# Patient Record
Sex: Female | Born: 1975 | Race: White | Marital: Married | Smoking: Former smoker
Health system: Northeastern US, Academic
[De-identification: ages and names within clinical notes are randomized; demographics above are authoritative.]

## PROBLEM LIST (undated history)

## (undated) DIAGNOSIS — F32A Depression, unspecified: Secondary | ICD-10-CM

## (undated) DIAGNOSIS — K769 Liver disease, unspecified: Secondary | ICD-10-CM

## (undated) DIAGNOSIS — I10 Essential (primary) hypertension: Secondary | ICD-10-CM

## (undated) DIAGNOSIS — N6089 Other benign mammary dysplasias of unspecified breast: Secondary | ICD-10-CM

## (undated) DIAGNOSIS — J45909 Unspecified asthma, uncomplicated: Secondary | ICD-10-CM

## (undated) DIAGNOSIS — E785 Hyperlipidemia, unspecified: Secondary | ICD-10-CM

## (undated) DIAGNOSIS — G43909 Migraine, unspecified, not intractable, without status migrainosus: Secondary | ICD-10-CM

## (undated) DIAGNOSIS — M1990 Unspecified osteoarthritis, unspecified site: Secondary | ICD-10-CM

## (undated) DIAGNOSIS — G709 Myoneural disorder, unspecified: Secondary | ICD-10-CM

## (undated) DIAGNOSIS — E119 Type 2 diabetes mellitus without complications: Secondary | ICD-10-CM

## (undated) DIAGNOSIS — B019 Varicella without complication: Secondary | ICD-10-CM

## (undated) DIAGNOSIS — I209 Angina pectoris, unspecified: Secondary | ICD-10-CM

## (undated) DIAGNOSIS — N938 Other specified abnormal uterine and vaginal bleeding: Secondary | ICD-10-CM

## (undated) DIAGNOSIS — G90A Postural orthostatic tachycardia syndrome (POTS): Secondary | ICD-10-CM

## (undated) DIAGNOSIS — M797 Fibromyalgia: Secondary | ICD-10-CM

## (undated) DIAGNOSIS — F419 Anxiety disorder, unspecified: Secondary | ICD-10-CM

## (undated) HISTORY — DX: Unspecified osteoarthritis, unspecified site: M19.90

## (undated) HISTORY — DX: Varicella without complication: B01.9

## (undated) HISTORY — DX: Other specified abnormal uterine and vaginal bleeding: N93.8

## (undated) HISTORY — PX: OTHER SURGICAL HISTORY: SHX169

## (undated) HISTORY — DX: Hyperlipidemia, unspecified: E78.5

## (undated) HISTORY — DX: Liver disease, unspecified: K76.9

## (undated) HISTORY — PX: DILATION AND CURETTAGE OF UTERUS: SHX78

## (undated) HISTORY — DX: Anxiety disorder, unspecified: F41.9

## (undated) HISTORY — DX: Fibromyalgia: M79.7

## (undated) HISTORY — DX: Type 2 diabetes mellitus without complications: E11.9

## (undated) HISTORY — DX: Myoneural disorder, unspecified: G70.9

## (undated) HISTORY — DX: Unspecified asthma, uncomplicated: J45.909

## (undated) HISTORY — DX: Migraine, unspecified, not intractable, without status migrainosus: G43.909

## (undated) HISTORY — PX: COLON SURGERY: SHX602

## (undated) HISTORY — DX: Other benign mammary dysplasias of unspecified breast: N60.89

## (undated) HISTORY — PX: TONSILLECTOMY: SHX28A

## (undated) HISTORY — DX: Depression, unspecified: F32.A

## (undated) HISTORY — DX: Essential (primary) hypertension: I10

---

## 2008-05-06 HISTORY — PX: OTHER SURGICAL HISTORY: SHX169

## 2009-03-09 ENCOUNTER — Ambulatory Visit: Payer: Self-pay | Admitting: Neurology

## 2009-03-09 DIAGNOSIS — J45909 Unspecified asthma, uncomplicated: Secondary | ICD-10-CM | POA: Insufficient documentation

## 2009-03-09 DIAGNOSIS — G43109 Migraine with aura, not intractable, without status migrainosus: Secondary | ICD-10-CM | POA: Insufficient documentation

## 2009-03-09 DIAGNOSIS — IMO0002 Reserved for concepts with insufficient information to code with codable children: Secondary | ICD-10-CM | POA: Insufficient documentation

## 2009-03-09 DIAGNOSIS — I10 Essential (primary) hypertension: Secondary | ICD-10-CM | POA: Insufficient documentation

## 2009-03-09 DIAGNOSIS — E785 Hyperlipidemia, unspecified: Secondary | ICD-10-CM | POA: Insufficient documentation

## 2009-06-22 ENCOUNTER — Ambulatory Visit: Payer: Self-pay | Admitting: Neurology

## 2009-07-06 ENCOUNTER — Ambulatory Visit: Payer: Self-pay | Admitting: Neurology

## 2009-08-28 ENCOUNTER — Encounter: Payer: Self-pay | Admitting: Gastroenterology

## 2009-09-28 ENCOUNTER — Ambulatory Visit: Payer: Self-pay | Admitting: Neurology

## 2009-10-23 NOTE — Progress Notes (Addendum)
HPI   Kaylee Harris is a 34 yo F here for follow-up of migraines. These are R   temporal/retro-orbital headaches with nausea, vomiting, photophobia and   phonophobia with preceding sparkling lights. As her prophylactic meds have   been adjusted, the headache frequency has varied. While on Topamax 75mg  QHS   and Lexapro 20mg  daily she had headaches only every 3 months. She then   switched antidepressants for insurance reasons to paroxetine and the   headaches increased to at least monthly. This past month she had 3-4   headaches.  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  As for abortive agents, the Compazine helps the nausea, but not the   headache. A low dose of Imitrex was not effective. She takes Mobic for her   shoulder pain and occasional Advil (1-2 days/week) for headaches.  Her overall health has been ok other than shoulder pain and a sinus   infection recently.  Active Problems   Asthma (493.90)  Diabetes Mellitus (250.00)  Hyperlipidemia (272.4)  Hypertension (401.9)  Migraine Headache (346.90).  Current Meds   Glimepiride 1 MG Tablet;; RPT  MetFORMIN HCl 500 MG Tablet Extended Release 24 Hour;; RPT  Vitamin D (Ergocalciferol) 50000 UNIT Capsule;; RPT  Endocet 7.5-325 MG Tablet;; RPT  Ventolin HFA 108 (90 Base) MCG/ACT Aerosol Solution;; RPT  Prochlorperazine Maleate 10 MG Tablet;TAKE 1 TABLET 3 TIMES DAILY PRN   headache and nausea; Rx  Lexapro 20 MG Tablet;TAKE 1 TABLET DAILY.; Rx  PARoxetine HCl 10 MG Tablet;TAKE 1 TABLET DAILY.; Rx  Topiramate 25 MG Tablet;Take 50mg  PO QHS for 2 weeks. Then take 75mg  QHS   and continue.; Rx  FLUoxetine HCl 20 MG Capsule;TAKE 1 CAPSULE DAILY.; Rx  SUMAtriptan Succinate 50 MG Tablet;TAKE 1 TABLET FOR MIGRAINE RELIEF. MAY   REPEAT after 2 hours, max. dose 100mg /day; Rx  Topiramate 25 MG Tablet;TAKE 3 TABLET BEDTIME; Rx  Ventolin 90 MCG/ACT AERS;INHALE 2 PUFFS 4 TIMES DAILY AS NEEDED.; RPT  Famciclovir 500 MG  Tablet;TAKE 1 TABLET 3 TIMES DAILY.; RPT  Cyclobenzaprine HCl 10 MG Tablet;TAKE 1 TABLET AT BEDTIME.; RPT  Meloxicam 15 MG Tablet;TAKE 1 TABLET DAILY.; RPT  Fluticasone Propionate 50 MCG/ACT Suspension;USE 1 SPRAY IN EACH NOSTRIL   ONCE DAILY.; RPT  Sertraline HCl 100 MG Tablet;TAKE 1 TABLET ONCE DAILY.; RPT  Vitamin D 56213 U TABS;! twice a week.; RPT  Hydrochlorothiazide 25 MG Tablet;TAKE 1/2 TABLET DAILY.; RPT  Glimepiride 2 MG Tablet;TAKE 1 TABLET DAILY WITH BREAKFAST.; RPT  MetFORMIN HCl 500 MG Tablet;TAKE 1 TABLET DAILY.; RPT  Omeprazole 20 MG Capsule Delayed Release;TAKE 1 CAPSULE TWICE DAILY.; RPT  Cetirizine HCl 10 MG Tablet;TAKE 1 TABLET DAILY AS DIRECTED.; RPT  Ester C TABS;; RPT.  ROS   10 point ROS negative other than listed in HPI.  Vital Signs   BP 164/71,  P 77, Wt 273 lbs.  Physical Exam   GENERAL:         Well appearing, in NAD.     MENTAL STATUS:  Alert and oriented x 3 with no deficits of speech or   language.  Affect  appropriate to situation.     CRANIAL NERVES:  Visual fields were full.  Fundoscopic exam was normal.    Pupils were 4 mm to 2 mm bilaterally.  EOMs were full without nystagmus.   Facial sensation and musculature were symmetric.  Palatal elevation was  symmetric, and tongue was midline.     MOTOR:  Bulk and tone were normal throughout.  Strength was 5/5 in   shoulders, biceps, triceps, FF's, hip flexors, ankle dorsi-and plantar   flexors.  No pronator drift.     SENSATION:  Intact  temperature and vibration in 4 extremities.     COORDINATION:  Finger to nose without ataxia.       GAIT:  Normal casual and tandem gait.     REFLEXES:  2+ upper and lower with downgoing toes.  Impression   34 yo F with migraines that are more frequent when her depression is more   active. Frequency of headaches has been reduced, but will try to further   reduce the number that begin.     -Continue Topamax 75mg  QHS  -Will try Prozac as SSRI for mood and migraine prophylaxis  -Imitrex (higher dose than  she previously tried) for abortive agent.  Attestation       I saw and evaluated the patient.  I agree with the resident's/fellow's   findings and plan of care as documented above.      Cleopatra Cedar, MD 10/23/2009 [facility]ill CenterCleopatra Cedar ; 10/23/2009 12:40   PM EST.  Signature   Electronically signed by: Gaetana Michaelis  MD - Res.; 10/18/2009 8:49 PM EST.  Electronically signed by: Cleopatra Cedar  M.D.; 10/23/2009 12:34 PM EST.  Electronically signed by: Cleopatra Cedar  M.D.; 10/23/2009 12:40 PM EST.

## 2010-05-15 NOTE — Miscellaneous (Unsigned)
 Continuity of Care Record  Created: todo  From: DAVID, MARTHA  From:   From: [employer], EHR v10.2.7.53  To: Sieg, Reyanne  Purpose: Patient Use;       Problems  Diagnosis: Asthma (493.90)   Diagnosis: Diabetes Mellitus (250.00)   Diagnosis: Hyperlipidemia (272.4)   Diagnosis: Hypertension (401.9)   Diagnosis: Migraine Headache (346.90)     Medications  Cetirizine HCl 10 MG Tablet; TAKE 1 TABLET DAILY AS DIRECTED. ; RPT   Cyclobenzaprine HCl 10 MG Tablet; TAKE 1 TABLET AT BEDTIME. ; RPT   Endocet 7.5-325 MG Tablet ; RPT   Ester C TABS ; RPT   Famciclovir 500 MG Tablet; TAKE 1 TABLET 3 TIMES DAILY. ; RPT   FLUoxetine HCl 20 MG Capsule; TAKE 1 CAPSULE DAILY. ; Rx   Fluticasone Propionate 50 MCG/ACT Suspension; USE 1 SPRAY IN EACH NOSTRIL   ONCE DAILY. ; RPT   Glimepiride 1 MG Tablet ; RPT   Glimepiride 2 MG Tablet; TAKE 1 TABLET DAILY WITH BREAKFAST. ; RPT   Hydrochlorothiazide 25 MG Tablet; TAKE 1/2 TABLET DAILY. ; RPT   Lexapro 20 MG Tablet; TAKE 1 TABLET DAILY. ; Rx   Meloxicam 15 MG Tablet; TAKE 1 TABLET DAILY. ; RPT   MetFORMIN HCl 500 MG Tablet; TAKE 1 TABLET DAILY. ; RPT   MetFORMIN HCl 500 MG Tablet Extended Release 24 Hour ; RPT   Omeprazole 20 MG Capsule Delayed Release; TAKE 1 CAPSULE TWICE DAILY. ; RPT   PARoxetine HCl 10 MG Tablet; TAKE 1 TABLET DAILY. ; Rx   Prochlorperazine Maleate 10 MG Tablet; TAKE 1 TABLET 3 TIMES DAILY PRN   headache and nausea ; Rx   Sertraline HCl 100 MG Tablet; TAKE 1 TABLET ONCE DAILY. ; RPT   SUMAtriptan Succinate 50 MG Tablet; TAKE 1 TABLET FOR MIGRAINE RELIEF. MAY   REPEAT after 2 hours, max. dose 100mg /day ; Rx   Topiramate 25 MG Tablet; TAKE 3 TABLET BEDTIME ; Rx   Topiramate 25 MG Tablet; Take 75mg  QHS PO. ; Rx   Ventolin 90 MCG/ACT AERS; INHALE 2 PUFFS 4 TIMES DAILY AS NEEDED. ; RPT   Ventolin HFA 108 (90 Base) MCG/ACT Aerosol Solution ; RPT   Vitamin D (Ergocalciferol) 50000 UNIT Capsule ; RPT   Vitamin D 16109 U TABS; ! twice a week. ;  RPT

## 2010-09-04 DIAGNOSIS — K5792 Diverticulitis of intestine, part unspecified, without perforation or abscess without bleeding: Secondary | ICD-10-CM | POA: Insufficient documentation

## 2010-09-04 HISTORY — DX: Diverticulitis of intestine, part unspecified, without perforation or abscess without bleeding: K57.92

## 2010-10-03 ENCOUNTER — Other Ambulatory Visit: Payer: Self-pay

## 2010-10-03 NOTE — Telephone Encounter (Signed)
 Pt of Allene Pyo, please renew medication. Thanks.

## 2010-10-04 ENCOUNTER — Other Ambulatory Visit: Payer: Self-pay

## 2010-10-04 MED ORDER — TOPIRAMATE 25 MG PO TABS *I*
75.0000 mg | ORAL_TABLET | Freq: Every evening | ORAL | Status: DC
Start: 2010-10-04 — End: 2010-10-04

## 2010-10-04 NOTE — Telephone Encounter (Signed)
 Refilled patient med based on most recent clinic note citing topamax 75 mg qhs as current dose.

## 2010-10-05 MED ORDER — TOPIRAMATE 25 MG PO TABS *I*
75.0000 mg | ORAL_TABLET | Freq: Every evening | ORAL | Status: DC
Start: 2010-10-04 — End: 2013-01-17

## 2011-09-04 HISTORY — PX: CARDIAC CATHETERIZATION: SHX172

## 2012-12-18 ENCOUNTER — Telehealth: Payer: Self-pay

## 2012-12-18 NOTE — Telephone Encounter (Signed)
Received call from pt requesting assistance with securing insurance as she recently moved back to [place] from [facility]. Gave pt information for applying for health care coverage through the [facility] [employer]-provided phone number and website. Also planned to mail pt a [facility]. Pt will f/u with Clinical research associate as needed.    Social Work will continue to remain available to assist as needed.    Tonna Corner, LMSW  Phone: [phone]  Pager: [phone]

## 2013-01-08 ENCOUNTER — Other Ambulatory Visit: Payer: Self-pay | Admitting: Nurse Practitioner

## 2013-01-08 ENCOUNTER — Ambulatory Visit: Payer: Self-pay | Admitting: Nurse Practitioner

## 2013-01-08 ENCOUNTER — Encounter: Payer: Self-pay | Admitting: Nurse Practitioner

## 2013-01-08 VITALS — BP 135/65 | HR 75 | Ht 68.0 in | Wt 278.0 lb

## 2013-01-08 DIAGNOSIS — G35 Multiple sclerosis: Secondary | ICD-10-CM

## 2013-01-08 MED ORDER — DIAZEPAM 5 MG PO TABS *I*
ORAL_TABLET | ORAL | Status: DC
Start: 2013-01-08 — End: 2013-03-24

## 2013-01-11 ENCOUNTER — Encounter: Payer: Self-pay | Admitting: Nurse Practitioner

## 2013-01-14 ENCOUNTER — Encounter: Payer: Self-pay | Admitting: Nurse Practitioner

## 2013-01-17 ENCOUNTER — Other Ambulatory Visit: Payer: Self-pay | Admitting: Nurse Practitioner

## 2013-01-17 MED ORDER — TOPIRAMATE 50 MG PO TABS *I*
50.0000 mg | ORAL_TABLET | Freq: Two times a day (BID) | ORAL | Status: DC
Start: 2013-01-17 — End: 2013-04-30

## 2013-01-17 MED ORDER — SUMATRIPTAN SUCCINATE 6 MG/0.5ML SC DEVI *A*
6.0000 mg | Freq: Once | SUBCUTANEOUS | Status: DC | PRN
Start: 2013-01-17 — End: 2013-08-09

## 2013-01-18 ENCOUNTER — Other Ambulatory Visit: Payer: Self-pay | Admitting: Nurse Practitioner

## 2013-01-18 MED ORDER — ONDANSETRON HCL 8 MG PO TABS *I*
8.0000 mg | ORAL_TABLET | Freq: Three times a day (TID) | ORAL | Status: DC | PRN
Start: 2013-01-18 — End: 2015-06-12

## 2013-01-18 MED ORDER — SUMATRIPTAN SUCCINATE 50 MG PO TABS *I*
50.0000 mg | ORAL_TABLET | ORAL | Status: DC | PRN
Start: 2013-01-18 — End: 2013-08-11

## 2013-01-18 NOTE — Telephone Encounter (Signed)
[name],  I checked with Noni and she does take this medication and will need a new prescription. Kaylee Harris

## 2013-01-18 NOTE — Telephone Encounter (Signed)
Gabapentin 600 mg, this was not on her med list.

## 2013-01-19 NOTE — Telephone Encounter (Signed)
I see that you have written scripts for Topamax, Sumitriptan injections (which I do not think she can get at a pharmacy) and Sumatriptan Tablets what migraine medication is she really on she has medicaid so they will not cover all 3 of these??

## 2013-01-19 NOTE — Telephone Encounter (Signed)
Kaylee Harris the oral Imitrex does not really help her and instead she wanted to try the injections and if they did not work would continue on the oral I would put in just the injections. Sorry about this I forget that Medicaid pays very for very little. Thank you

## 2013-01-29 ENCOUNTER — Other Ambulatory Visit: Payer: Self-pay | Admitting: Nurse Practitioner

## 2013-01-29 ENCOUNTER — Telehealth: Payer: Self-pay | Admitting: Nurse Practitioner

## 2013-01-29 NOTE — Progress Notes (Addendum)
PATIENT NAME:  Kaylee Harris   MRN: [id number]   DOB: 11-12-1975   DATE OF SERVICE: 01/08/2013          REASON FOR VISIT  Follow up one year appointment to discuss medical management and current symptoms.  HPI  Patient is accompanied by her partner [name] who both state that she has had more chronic pain issues in the last six months. She also has more issues with chronic fatigue and short term memory issues. She just relocated back to [place] and has been under a great deal of financial and personal stress and feels that her current symptoms are worse probably owing to the stress. She has not had any new symptoms just worsening of the ongoing symptoms mentioned in this note.   ALLERGIES  Allergies   Allergen Reactions   . Morphine Anaphylaxis   . Trazodone Anaphylaxis        CURRENT MEDS:   See current medication list.      PERSONAL Hx:  Possible multiple sclerosis with ongoing symptoms of chronic fatigue, and pain mainly in her lower legs and thighs. She has not had any new symptoms just worsening of her current symptoms. She has had a great deal of personal and financial stress and feels that some of her symptoms may be related.    ROS:  A 10 point review of systems was done including those positive findings included in the HPI. Patient reports the above mentioned symptoms.     VITAL SIGNS:  Wt Readings from Last 3 Encounters:   01/08/13 126.1 kg (278 lb)   09/28/09 123.832 kg (273 lb)   03/09/09 125.873 kg (277 lb 8 oz)     Temp Readings from Last 3 Encounters:   No data found for Temp     BP Readings from Last 3 Encounters:   01/08/13 135/65   09/28/09 164/71   03/09/09 129/84     Pulse Readings from Last 3 Encounters:   01/08/13 75   09/28/09 77   03/09/09 98      Pain    01/08/13 1542   PainSc:   5   PainLoc: Hip        PHYSICAL EXAM:  On physical examination she was alert and oriented times three. There are normal eye movements with normal facial symmetry and no finger to nose ataxia. Upper strength is (R/L) 5/5  Lower strength is (R/L) 5/5 and dorsi flexion 5/5. There is normal tone in both legs and decrease in vibratory sensation in both feet. A timed 8 meter walk took 4:67 seconds.    ASSESSMENT:  Possible multiple sclerosis with worsening of current symptoms, fatigue, pain in her lower legs and back. She will be scheduled for an MRI of the Brain, cervical and thoracic spine in the next month. I made no other changes to her medications. She should be considered permanently disabled. Thank you for allowing [place] to participate in the care of your patient.

## 2013-01-29 NOTE — Telephone Encounter (Signed)
Patient was in hospital needs MRI asap and also wants an urgent appt Please call to discuss

## 2013-01-29 NOTE — Telephone Encounter (Signed)
Spoke to patient and let her know that MRI of the Brain and cervical spine already put in the E Record system and she should call [facility] to schedule the MRIs next week. When we receive the results of the MRIs then can schedule an appointment to discuss the results. She was fine with this and will call back if any new symptoms.

## 2013-01-31 ENCOUNTER — Other Ambulatory Visit: Payer: Self-pay | Admitting: Nurse Practitioner

## 2013-02-01 ENCOUNTER — Encounter: Payer: Self-pay | Admitting: Nurse Practitioner

## 2013-02-01 ENCOUNTER — Other Ambulatory Visit: Payer: Self-pay | Admitting: Nurse Practitioner

## 2013-02-01 ENCOUNTER — Ambulatory Visit
Admit: 2013-02-01 | Discharge: 2013-02-01 | Disposition: A | Discharge status: Home / Self Care | Payer: Self-pay | Source: Ambulatory Visit | Attending: Nurse Practitioner | Admitting: Nurse Practitioner

## 2013-02-01 LAB — POCT CREATININE
Creatinine, POCT: 1.1 mg/dL — ABNORMAL HIGH (ref 0.51–0.95)
GFR,Black POC: 73 *
GFR,Other POC: 63 *

## 2013-02-01 MED ORDER — BUPROPION HCL 150 MG PO TB12 *I*
150.0000 mg | ORAL_TABLET | Freq: Two times a day (BID) | ORAL | Status: DC
Start: 2013-02-01 — End: 2013-06-09

## 2013-02-01 MED ORDER — GABAPENTIN 600 MG PO TABS
600.0000 mg | ORAL_TABLET | Freq: Three times a day (TID) | ORAL | Status: DC
Start: 2013-02-01 — End: 2013-04-09

## 2013-02-01 NOTE — Telephone Encounter (Signed)
Patients pharmacy is also requesting refill on cardizem which is for High blood pressure I cannot put this in please refill if appropriate

## 2013-02-01 NOTE — Telephone Encounter (Signed)
Pharmacy requesting refill on cardizem used for High blood pressure not sure who is supposed to be prescribing probably PCP???

## 2013-02-14 ENCOUNTER — Encounter: Payer: Self-pay | Admitting: Nurse Practitioner

## 2013-02-15 ENCOUNTER — Other Ambulatory Visit: Payer: Self-pay | Admitting: Neurology

## 2013-02-15 ENCOUNTER — Telehealth: Payer: Self-pay | Admitting: Nurse Practitioner

## 2013-02-15 ENCOUNTER — Encounter: Payer: Self-pay | Admitting: Nurse Practitioner

## 2013-02-15 ENCOUNTER — Encounter: Payer: Self-pay | Admitting: Neurology

## 2013-02-15 MED ORDER — NON-SYSTEM MEDICATION *A*
Status: AC
Start: 2013-02-15 — End: ?

## 2013-02-15 NOTE — Telephone Encounter (Signed)
Needs a letter for work with restrictions on it needs by 10/17 please call when finished [name] is cc'd

## 2013-03-24 ENCOUNTER — Ambulatory Visit: Payer: Self-pay | Admitting: Primary Care

## 2013-03-24 ENCOUNTER — Encounter: Payer: Self-pay | Admitting: Primary Care

## 2013-03-24 ENCOUNTER — Telehealth: Payer: Self-pay

## 2013-03-24 VITALS — BP 116/90 | HR 80 | Ht 68.0 in | Wt 278.0 lb

## 2013-03-24 DIAGNOSIS — G43909 Migraine, unspecified, not intractable, without status migrainosus: Secondary | ICD-10-CM

## 2013-03-24 DIAGNOSIS — E119 Type 2 diabetes mellitus without complications: Secondary | ICD-10-CM

## 2013-03-24 DIAGNOSIS — R55 Syncope and collapse: Secondary | ICD-10-CM

## 2013-03-24 DIAGNOSIS — L989 Disorder of the skin and subcutaneous tissue, unspecified: Secondary | ICD-10-CM

## 2013-03-24 DIAGNOSIS — I441 Atrioventricular block, second degree: Secondary | ICD-10-CM

## 2013-03-24 DIAGNOSIS — Z975 Presence of (intrauterine) contraceptive device: Secondary | ICD-10-CM | POA: Insufficient documentation

## 2013-03-24 DIAGNOSIS — F32A Depression, unspecified: Secondary | ICD-10-CM

## 2013-03-24 DIAGNOSIS — M797 Fibromyalgia: Secondary | ICD-10-CM | POA: Insufficient documentation

## 2013-03-24 DIAGNOSIS — F3341 Major depressive disorder, recurrent, in partial remission: Secondary | ICD-10-CM | POA: Insufficient documentation

## 2013-03-24 DIAGNOSIS — E785 Hyperlipidemia, unspecified: Secondary | ICD-10-CM

## 2013-03-24 MED ORDER — FAMCICLOVIR 500 MG PO TABS *I*
500.0000 mg | ORAL_TABLET | Freq: Three times a day (TID) | ORAL | Status: DC | PRN
Start: 2013-03-24 — End: 2013-06-22

## 2013-03-24 MED ORDER — OMEPRAZOLE 20 MG PO CPDR *I*
20.0000 mg | DELAYED_RELEASE_CAPSULE | Freq: Every day | ORAL | Status: DC
Start: 2013-03-24 — End: 2013-06-22

## 2013-03-24 NOTE — Telephone Encounter (Signed)
Patient will call back at a more convenient time to complete phone screen.

## 2013-03-24 NOTE — Progress Notes (Addendum)
[facility] - Outpatient Progress Note    SUBJECTIVE    Kaylee Harris is a 37 y.o. female here to establish care. she is transferring from [place]. she has the following concerns today, including:    1. Syncope - Has episodes about 10 years in duration, worsening in frequency and duration. In the last year has had angiography, 24h holter notable for Mobitz 1, positive tilt table in [place]. Told she had neurocardiogenic syncope and treated with florinef. Still had persistent symptoms while on this, and then moved to [place]. Has established care with Dr. Herminio Heads in [place], and has since had loop recorder placed 02/03/13. Has had syncopal episodes since then, told that she should be under 24h supervision and has activity restrictions (disability and no  driving). Seeing him next month. Florinef was incresed to BID but had persistent episodes, therefore recently put on Theophylline within the last week.   Has since been feeling a little jittery.    2. Fibromyalgia - Requesting referral for Rhematologist. Diagnosed 12 yrs ago, previously been seen at [facility] (PCP and Rheumatologist). Had tried muscle relaxers, APAP, NSAIDs, Gabapentin. Has not tried SNRIs. Pain is mainly in shoulder blades, lower back, legs (hips, buttocks). Has numbness of lateral aspect of L hip.     3. Depression - Chronic, uncontrolled. Diagnosed ~5 yrs ago. +sad mood, fatigue, decreased energy, decreased concentration, difficulty falling asleep (racing thoughts), crying spells. Admits to anxiety, fidgeting. In the past has been on Paxil, Sertraline, Lexapro (in [place]). Denies SNRIs. Thinks there has been some improvement with wellbutrin, no dose increase.     4. H/o DM2, HLP - Has been able to diet control. Previously on Metformin and SU, but weaned herself off. Currently on statin.     Patient's medications, allergies, past medical, surgical, social and/or family histories were reviewed with the patient and  updated/populated in eRecord.   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 Married, wife [name]    Current Outpatient Prescriptions   Medication Sig   . atorvastatin (LIPITOR) 40 MG tablet Take 40 mg by mouth daily (with dinner)   . ranolazine (RANEXA) 500 MG 12 hr tablet Take 500 mg by mouth 2 times daily   Swallow whole. Do not crush, break, or chew.   . theophylline (THEODUR) 200 MG 12 hr tablet Take 200 mg by mouth 2 times daily   Do not crush or chew. May be divided.   . TURMERIC CURCUMIN PO Take 400 mg by mouth daily   . Bioflavonoid Products (ESTER C PO) Take 500 mg by mouth daily   . clidinium-chlordiazePOXIDE (LIBRAX) 5-2.5 MG per capsule Take 1 capsule by mouth 3 times daily (before meals)   . naproxen sodium (ANAPROX) 550 MG tablet Take 550 mg by mouth 2 times daily (with meals)   . cholecalciferol (VITAMIN D) 1000 UNIT capsule Take 1,000 Units by mouth daily   . cyanocobalamin 500 MCG tablet Take 500 mcg by mouth daily   . omeprazole (PRILOSEC) 20 MG capsule Take 1 capsule (20 mg total) by mouth daily (before breakfast)   . famciclovir (FAMVIR) 500 MG tablet Take 1 tablet (500 mg total) by mouth 3 times daily as needed   . gabapentin (NEURONTIN) 600 MG tablet Take 1 tablet (600 mg total) by mouth  3 times daily   . buPROPion (WELLBUTRIN SR) 150 MG 12 hr tablet Take 1 tablet (150 mg total) by mouth 2 times daily   Swallow whole. Do not crush, break, or chew.   . ondansetron (ZOFRAN) 8 MG tablet Take 1 tablet (8 mg total) by mouth 3 times daily as needed for Nausea   . SUMAtriptan (IMITREX) 50 MG tablet Take 1 tablet (50 mg total) by mouth as needed for Migraine   Take at onset of headache. May repeat once in 2 hours.   . SUMAtriptan kit (IMITREX STATDOSE) 6 MG/0.5ML injection Inject 0.5 mLs (6 mg total) into the skin once as needed for Migraine    May repeat once after 1 hour if needed.   . topiramate (TOPAMAX) 50 MG tablet Take 1 tablet (50 mg total) by mouth 2 times daily   . VENTOLIN HFA 108 (90 BASE) MCG/ACT inhaler    . cetirizine (ZYRTEC) 10 MG tablet TAKE 1 TABLET DAILY AS DIRECTED.   . fluticasone (FLONASE) 50 MCG/ACT nasal spray USE 1 SPRAY IN EACH NOSTRIL ONCE DAILY.   Marland Kitchen Non-System Medication The above patient is followed in our clinic and cannot resume work permanently.     Review of Systems   Constitutional:  --negative for fever and chills   --positive for recent weight gain and chronic fatigue  Eyes:  -- negative for blurred vision   --positive for scotoma, redness/irritation during migraine sx  ENMT:   --negative for rhinorrhea or congestion  --negative for hearing loss or ear pain  --positive for cold sores and tooth pain  --negative for ST   Respiratory:   --positive for shortness of breath, cough, wheeze (asthma). Uses albuterol 1x monthly  Cardiovascular:  --positive for chest pain, palpitations, SOB, syncope with standing, walking, emotional triggers  --negative for orthopnea  --negative for pedal edema  Gastrointestinal:  --positive for abdominal pain, alternating constipation/diarrhea, hemorrhoid and BRBPR  Genitourinary:  --negative for pain with urination, hematuria, incontinence  --negative for vaginal discharge, dyspareunia, irregular menses  Heme/Lymph:  --negative for swollen lymph nodes   --negative for easy bruising or bleeding  Skin/Breast:  --positive for mole on L chin that is growing slowly  Psych:  --positive for depressed mood, anxiety,SI  --denies substance abuse, active suicidal plan or previous attempt  Endo:  --negative for polyuria  --positive for polydipsia  --negative for heat/cold intolerance   MSK:  --positive for myalgias as above  --negative for synovitis  Neuro:  --positive for dizziness, falls, weakness  --positive for migraine h/a's  --positive for numbness of L lateral thigh, chronic, since shoulder surgery  in 2010      OBJECTIVE    Last Filed Vitals    03/24/13 1339   BP: 116/90   Pulse: 80   SpO2: 97%     Body mass index is 42.28 kg/(m^2).      General: sad-appearing Caucasian female, in NAD  Psych:  depressed mood and labile affect, frequently tearing up; answers questions appropriately; AAOx3  Eyes:  Normal lids and conjuctivae, EOMI, PERRLA  HEENT: tympanic membranes normal with good light reflexes and good bony landmarks; external auditory canals are clear and without erythema or drainage;  mouth has moist mucous membranes and no lesions, erythema or exudate  Neck: supple, no thyromegaly, no palpable thyroid nodules  Lymph: no cervical or supraclavicular LAD  Lungs: normal respiratory effort, lungs are clear to auscultation bilaterally with no rales, wheezes, or rhonchi  CV: RRR, no murmur, rub or  gallop, no pedal edema. No carotid bruits.  Abdomen: soft, obese, non-tender, non-distended, no masses or hepatosplenomegaly appreciated but limited by body habitus, no rebound or guarding; positive bowel sounds all 4 quadrants   Skin: Warm, dry, normal turgor; no rashes visible  MSK: Diffuse tenderness to palpation of occiput, medial scapular border, SCM insertion, anterior chest wall, elbows, b/l hips and medial knees. No active synovitis appreciated.    ASSESSMENT & PLAN    1. Neurocardiogenic syncope  2. Mobitz type 1 second degree AV block  Uncontrolled, now with loop recorder in place for diagnostic work up. Requested release of info from cardiologist to allow future communications. Continue medications as prescribed and advised to alert cardiologist of "jittery" side effect of theophyline. Reviewed warning s/s of ACS and reasons to present to ED.     3. Fibromyalgia  4. Depression  Offered to take over treatment of depression and fibromyalgia. Reviewed potential future treatments, including SNRI which she seemed interested in. Asked her to discuss the use of Cymbalta with cardiologist when she sees him next month  and we will consider this pending his input. Referred to counseling as well. Concerned about somatization d/o given positive ROS and neuro/GI/GU involvement.   - AMB REFERRAL TO PSYCH - ADULT CLINIC    5. Diabetes mellitus  6. Hyperlipemia  Reassess control and comorbidities. Continue statin.   - Hemoglobin A1c; Future  - CBC; Future  - Lipid panel; Future  - Comprehensive metabolic panel; Future  - TSH; Future            Follow up in 4 weeks re: fibro, return to clinic sooner prn.        Farrell Ours, MD  [facility]  03/24/2013   1:53 PM

## 2013-03-24 NOTE — Patient Instructions (Addendum)
We discussed your fibromyalgia and depression today. We are considering starting a medication to help with both, called an SNRI (Cymbalta). I would like your cardiologist to let me know if this is an appropriate medication to start given your current medications and fainting history. Please ask him this when you see him next week and have him comment on it and send me his notes after your visit.         [facility] phone number: [phone]    Behavioral Health  [phone]

## 2013-03-28 ENCOUNTER — Encounter: Payer: Self-pay | Admitting: Primary Care

## 2013-03-28 ENCOUNTER — Other Ambulatory Visit: Payer: Self-pay | Admitting: Primary Care

## 2013-03-28 DIAGNOSIS — L989 Disorder of the skin and subcutaneous tissue, unspecified: Secondary | ICD-10-CM | POA: Insufficient documentation

## 2013-03-29 ENCOUNTER — Telehealth: Payer: Self-pay

## 2013-03-29 ENCOUNTER — Encounter: Payer: Self-pay | Admitting: Gastroenterology

## 2013-03-29 ENCOUNTER — Encounter: Payer: Self-pay | Admitting: Primary Care

## 2013-03-29 NOTE — Telephone Encounter (Signed)
Per Dr. Lew Dawes [facility]) it is ok for the patient to take symbolta.

## 2013-04-05 ENCOUNTER — Encounter: Payer: Self-pay | Admitting: Primary Care

## 2013-04-05 ENCOUNTER — Ambulatory Visit
Admit: 2013-04-05 | Discharge: 2013-04-05 | Disposition: A | Discharge status: Home / Self Care | Payer: Self-pay | Source: Ambulatory Visit | Attending: Primary Care | Admitting: Primary Care

## 2013-04-05 DIAGNOSIS — E119 Type 2 diabetes mellitus without complications: Secondary | ICD-10-CM

## 2013-04-05 DIAGNOSIS — E785 Hyperlipidemia, unspecified: Secondary | ICD-10-CM

## 2013-04-05 LAB — COMPREHENSIVE METABOLIC PANEL
ALT: 16 U/L (ref 0–35)
AST: 17 U/L (ref 0–35)
Albumin: 4.3 g/dL (ref 3.5–5.2)
Alk Phos: 101 U/L (ref 35–105)
Anion Gap: 11 (ref 7–16)
Bilirubin,Total: 0.4 mg/dL (ref 0.0–1.2)
CO2: 24 mmol/L (ref 20–28)
Calcium: 9.2 mg/dL (ref 8.8–10.2)
Chloride: 103 mmol/L (ref 96–108)
Creatinine: 0.89 mg/dL (ref 0.51–0.95)
GFR,Black: 94 *
GFR,Caucasian: 82 *
Glucose: 144 mg/dL — ABNORMAL HIGH (ref 60–99)
Lab: 11 mg/dL (ref 6–20)
Potassium: 4.4 mmol/L (ref 3.3–5.1)
Sodium: 138 mmol/L (ref 133–145)
Total Protein: 6.9 g/dL (ref 6.3–7.7)

## 2013-04-05 LAB — CBC
Hematocrit: 41 % (ref 34–45)
Hemoglobin: 13.5 g/dL (ref 11.2–15.7)
MCV: 90 fL (ref 79–95)
Platelets: 344 10*3/uL (ref 160–370)
RBC: 4.6 MIL/uL (ref 3.9–5.2)
RDW: 13.7 % (ref 11.7–14.4)
WBC: 11 10*3/uL — ABNORMAL HIGH (ref 4.0–10.0)

## 2013-04-05 LAB — TSH: TSH: 2.68 u[IU]/mL (ref 0.27–4.20)

## 2013-04-05 LAB — LIPID PANEL
Chol/HDL Ratio: 3
Cholesterol: 131 mg/dL
HDL: 43 mg/dL
LDL Calculated: 62 mg/dL
Non HDL Cholesterol: 88 mg/dL
Triglycerides: 129 mg/dL

## 2013-04-06 ENCOUNTER — Telehealth: Payer: Self-pay

## 2013-04-06 ENCOUNTER — Encounter: Payer: Self-pay | Admitting: Primary Care

## 2013-04-06 LAB — HEMOGLOBIN A1C: Hemoglobin A1C: 7.2 % — ABNORMAL HIGH (ref 4.0–6.0)

## 2013-04-06 MED ORDER — DULOXETINE HCL 30 MG PO CPEP *I*
DELAYED_RELEASE_CAPSULE | ORAL | Status: DC
Start: 2013-04-06 — End: 2013-05-03
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
## 2013-04-07 ENCOUNTER — Encounter: Payer: Self-pay | Admitting: Primary Care

## 2013-04-07 MED ORDER — GLIMEPIRIDE 2 MG PO TABS *I*
2.0000 mg | ORAL_TABLET | Freq: Every day | ORAL | Status: DC
Start: 2013-04-07 — End: 2013-05-03

## 2013-04-07 MED ORDER — METFORMIN HCL 750 MG PO TB24 *I*
750.0000 mg | ORAL_TABLET | Freq: Every day | ORAL | Status: DC
Start: 2013-04-07 — End: 2013-04-26

## 2013-04-09 ENCOUNTER — Other Ambulatory Visit: Payer: Self-pay | Admitting: Nurse Practitioner

## 2013-04-09 ENCOUNTER — Encounter: Payer: Self-pay | Admitting: Nurse Practitioner

## 2013-04-09 MED ORDER — GABAPENTIN 600 MG PO TABS
600.0000 mg | ORAL_TABLET | Freq: Three times a day (TID) | ORAL | Status: DC
Start: 2013-04-09 — End: 2013-08-04

## 2013-04-10 ENCOUNTER — Ambulatory Visit: Payer: Self-pay

## 2013-04-13 ENCOUNTER — Ambulatory Visit: Payer: Self-pay | Admitting: Primary Care

## 2013-04-15 ENCOUNTER — Other Ambulatory Visit: Payer: Self-pay | Admitting: Nurse Practitioner

## 2013-04-15 ENCOUNTER — Ambulatory Visit
Admit: 2013-04-15 | Discharge: 2013-05-05 | Disposition: A | Discharge status: Home / Self Care | Payer: Self-pay | Source: Ambulatory Visit | Attending: Psychiatry | Admitting: Psychiatry

## 2013-04-15 ENCOUNTER — Ambulatory Visit: Payer: Self-pay | Admitting: Psychologist

## 2013-04-15 ENCOUNTER — Ambulatory Visit: Payer: Self-pay

## 2013-04-15 DIAGNOSIS — G35 Multiple sclerosis: Secondary | ICD-10-CM
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
## 2013-04-17 ENCOUNTER — Encounter: Payer: Self-pay | Admitting: Primary Care

## 2013-04-19 ENCOUNTER — Encounter: Payer: Self-pay | Admitting: Psychologist

## 2013-04-20 ENCOUNTER — Ambulatory Visit: Payer: Self-pay
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
## 2013-04-20 NOTE — Progress Notes (Signed)
[facility] Safety Plan     Things that cause you to feel stressed:  arguments, being called a name, being stared at, being threatened, being touched, feeling pressured, feeling worried, not being listened to, people yelling and specific people: FIL, mother    Warning signs:  bouncing legs, hurting yourself, isolating/avoiding behavior, loud voice, lying, not following directions, not taking care of self, red face, rudeness, swearing and wringing hands    Coping strategies:  being around others, being in a dark/quiet place, drinking a beverage, having a hug, listening to music, screaming in a pillow, snuggling in a blanket, taking medications, taking space, watching television and being with the dogs    People whom I can ask for help:  Family member: Aunt; phone: [phone]  Therapist/Counselor: Othella Boyer [phone]  Spouse/significant other: [name]; phone: [phone]    Keeping a safe environment:  staying with family/friend, removing weapon or firearm from home, removing access to sharp objects, removing alcohol from home and medication monitoring/lock up medications    Professionals or agencies I can contact during a crisis:  Clinician Name: Othella Boyer; Phone: [phone]    Clinician Emergency Contact number/Answering Service number: [phone]    [place] OR MOBILE CRISIS: [phone] or [phone]  SUICIDE PREVENTION [place] PHONE: [phone]  POLICE: 911    IF NEEDED, GO TO THE HOSPITAL EMERGENCY ROOM      Patient Signature: ______________________________________________________      Clinician Signature: _____________________________________________________
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
## 2013-04-26 ENCOUNTER — Encounter: Payer: Self-pay | Admitting: Primary Care

## 2013-04-26 ENCOUNTER — Ambulatory Visit: Payer: Self-pay | Admitting: Primary Care

## 2013-04-26 VITALS — BP 118/74 | HR 88 | Temp 97.8°F | Ht 68.0 in | Wt 284.8 lb

## 2013-04-26 DIAGNOSIS — E119 Type 2 diabetes mellitus without complications: Secondary | ICD-10-CM

## 2013-04-26 DIAGNOSIS — M797 Fibromyalgia: Secondary | ICD-10-CM

## 2013-04-26 DIAGNOSIS — F32A Depression, unspecified: Secondary | ICD-10-CM

## 2013-04-26 MED ORDER — BLOOD GLUCOSE MONITOR SYSTEM KIT *A*
PACK | Status: DC
Start: 2013-04-26 — End: 2014-11-14

## 2013-04-26 MED ORDER — VENTOLIN HFA 108 (90 BASE) MCG/ACT IN AERS
1.0000 | INHALATION_SPRAY | RESPIRATORY_TRACT | Status: DC | PRN
Start: 2013-04-26 — End: 2013-09-03

## 2013-04-26 MED ORDER — FLUTICASONE PROPIONATE 50 MCG/ACT NA SUSP *I*
1.0000 | Freq: Every day | NASAL | Status: DC
Start: 2013-04-26 — End: 2013-12-13

## 2013-04-26 MED ORDER — CHLORDIAZEPOXIDE-CLIDINIUM 5-2.5 MG PO CAPS *I*
1.0000 | ORAL_CAPSULE | Freq: Three times a day (TID) | ORAL | Status: DC
Start: 2013-04-26 — End: 2013-05-19

## 2013-04-26 NOTE — Patient Instructions (Signed)
Check your sugar twice a day: in the morning (fasting) and then another time during the day, 2 hours after your meal.   The goal for a fasting sugar is <125 (ideally <100)  The goal for a sugar 2 hours after a meal is <180.     We decided to stop the metformin due to the diarrhea side effect. Continue the amaryl, which you should take every morning with breakfast.    We will check the "A1c" (the 3 month marker) again in March.

## 2013-04-26 NOTE — Progress Notes (Addendum)
[facility]    SUBJECTIVE    Pt is here to discuss:    1. Fibromyalgia and depression - Started Cymbalta a few weeks ago, now up to 60mg  daily. Noticed h/a at first, which has since resolved. Thinks the medication is helping with her pain. Does not see any mood results yet. Set up with [facility] and seeing Othella Boyer; partner [name] believes this is helping and pt agrees. Has been given goals and plans on addressing some of these soon.     2. DM2- Restarted on Metformin ER and Amaryl due to A1c of 7.2. Has been having significant diarrhea since, multiple times a day and even at night. Previously happened with the metformin. No hypoglycemic sx.      PMH / Family Hx / Social Hx  Patient's medications, allergies, problem list, past medical, social histories were reviewed and notable for:    Lipids well controlled:       Lab results: 04/05/13  1157   Cholesterol 131   HDL 43   LDL Calculated 62   Triglycerides 129   Chol/HDL Ratio 3.0         Current Outpatient Prescriptions   Medication Sig   . gabapentin (NEURONTIN) 600 MG tablet Take 1 tablet (600 mg total) by mouth 3 times daily   . metFORMIN (GLUCOPHAGE-XR) 750 MG 24 hr tablet Take 1 tablet (750 mg total) by mouth daily (with dinner)   Swallow whole. Do not crush, break, or chew.   . glimepiride (AMARYL) 2 MG tablet Take 1 tablet (2 mg total) by mouth daily (with breakfast)   . DULoxetine (CYMBALTA) 30 MG DR capsule Take 1 capsule (30mg ) daily for one week, then increase to 2 capsules (60mg ) daily.   . Ascorbic Acid 500 MG CAPS Take 1 capful by mouth 2 times daily   . terbinafine (LAMISIL) 250 MG tablet Take 250 mg by mouth daily   . atorvastatin (LIPITOR) 40 MG tablet Take 40 mg by mouth daily (with dinner)   . ranolazine (RANEXA) 500 MG 12 hr tablet Take 500 mg by mouth 2 times daily   Swallow whole. Do not crush, break, or chew.   . theophylline (THEODUR) 200 MG 12 hr tablet Take 200 mg by mouth 2 times daily   Do not crush or chew. May be divided.    . TURMERIC CURCUMIN PO Take 400 mg by mouth daily   . Bioflavonoid Products (ESTER C PO) Take 500 mg by mouth daily   . clidinium-chlordiazePOXIDE (LIBRAX) 5-2.5 MG per capsule Take 1 capsule by mouth 3 times daily (before meals)   . naproxen sodium (ANAPROX) 550 MG tablet Take 550 mg by mouth 2 times daily (with meals)   . cholecalciferol (VITAMIN D) 1000 UNIT capsule Take 1,000 Units by mouth daily   . cyanocobalamin 500 MCG tablet Take 500 mcg by mouth daily   . omeprazole (PRILOSEC) 20 MG capsule Take 1 capsule (20 mg total) by mouth daily (before breakfast)   . famciclovir (FAMVIR) 500 MG tablet Take 1 tablet (500 mg total) by mouth 3 times daily as needed   . buPROPion (WELLBUTRIN SR) 150 MG 12 hr tablet Take 1 tablet (150 mg total) by mouth 2 times daily   Swallow whole. Do not crush, break, or chew.   . ondansetron (ZOFRAN) 8 MG tablet Take 1 tablet (8 mg total) by mouth 3 times daily as needed for Nausea   . SUMAtriptan kit (IMITREX STATDOSE) 6 MG/0.5ML injection Inject  0.5 mLs (6 mg total) into the skin once as needed for Migraine   May repeat once after 1 hour if needed.   . topiramate (TOPAMAX) 50 MG tablet Take 1 tablet (50 mg total) by mouth 2 times daily   . VENTOLIN HFA 108 (90 BASE) MCG/ACT inhaler    . cetirizine (ZYRTEC) 10 MG tablet TAKE 1 TABLET DAILY AS DIRECTED.   . fluticasone (FLONASE) 50 MCG/ACT nasal spray USE 1 SPRAY IN EACH NOSTRIL ONCE DAILY.   Marland Kitchen diltiazem (CARDIZEM) 30 MG tablet Take 30 mg by mouth 2 times daily   . Non-System Medication The above patient is followed in our clinic and cannot resume work permanently.   . SUMAtriptan (IMITREX) 50 MG tablet Take 1 tablet (50 mg total) by mouth as needed for Migraine   Take at onset of headache. May repeat once in 2 hours.       ROS  Per HPI      OBJECTIVE  Filed Vitals:    04/26/13 1437   BP: 118/74   Pulse: 88   Temp: 36.6 C (97.8 F)   TempSrc: Temporal   Height: 1.727 m (5\' 8" )   Weight: 129.184 kg (284 lb 12.8 oz)     Body mass  index is 43.31 kg/(m^2).      General: well-appearing obese Caucasian female, in NAD  ENT: Moist mucous membranes.   Psych: AAOx3, improved mood compared to last visits, less labile affect. Insight and judgement intact.       ASSESSMENT & PLAN  1. Fibromyalgia  Improving per pt with SNRI. Continue Gabapentin as well. F/u in 1 month.     2. Depression  Improving with [facility] input per pt. Continue SNRI. F/u 1 month.    3. Diabetes  Intolerant of metformin. Will d/c due to diarrhea side effects. Continue SU and reassess A1c in 3 months.  - Hemoglobin A1c; Future  - Microalbumin, Urine, Random; Future    Follow-up: 4-6 weeks re: fibro, depression and 3 months re: DM      Farrell Ours, MD  [facility]  04/26/2013  2:43 PM        ______________________

## 2013-04-30 ENCOUNTER — Other Ambulatory Visit: Payer: Self-pay | Admitting: Primary Care

## 2013-04-30 ENCOUNTER — Encounter: Payer: Self-pay | Admitting: Primary Care

## 2013-04-30 ENCOUNTER — Other Ambulatory Visit: Payer: Self-pay | Admitting: Nurse Practitioner

## 2013-04-30 MED ORDER — TOPIRAMATE 50 MG PO TABS *I*
50.0000 mg | ORAL_TABLET | Freq: Two times a day (BID) | ORAL | Status: DC
Start: 2013-04-30 — End: 2013-08-04

## 2013-04-30 NOTE — Telephone Encounter (Signed)
Wrote order to renew topiramate.  In reviewing labs, pt's HgA1C was 7.2, which could account for some of her fatigue

## 2013-05-03 MED ORDER — ALCOHOL WIPES PADS
MEDICATED_PAD | Status: DC
Start: 2013-05-03 — End: 2013-08-04

## 2013-05-03 MED ORDER — GLIMEPIRIDE 4 MG PO TABS *I*
4.0000 mg | ORAL_TABLET | Freq: Every day | ORAL | Status: DC
Start: 2013-05-03 — End: 2013-08-04

## 2013-05-03 MED ORDER — FREESTYLE LITE TEST VI STRP *A*
ORAL_STRIP | Status: DC
Start: 2013-05-03 — End: 2013-08-04

## 2013-05-03 MED ORDER — DULOXETINE HCL 60 MG PO CPEP *I*
60.0000 mg | DELAYED_RELEASE_CAPSULE | Freq: Every day | ORAL | Status: DC
Start: 2013-05-03 — End: 2013-06-18

## 2013-05-03 MED ORDER — LANCETS MISC *A*
Status: DC
Start: 2013-05-03 — End: 2013-08-04

## 2013-05-05 ENCOUNTER — Ambulatory Visit: Payer: Self-pay

## 2013-05-05 NOTE — Progress Notes (Signed)
[facility] MISSED/CANCELLED APPOINTMENT     Name: Kaylee Harris  MRN: [id number]   DOB: [birth year]    Date of Scheduled Service: 05/05/13    Ms. Loder cancelled today's appointment with greater than 24 hours notice.  I have spoken with the patient by phone.  The patient stated the reason for today's absence is no transportation. Writer discussed patient contacting her PCP for [employer] and patient indicated her understanding of this process. Writer rescheduled patient for 05/18/13 @1p .m..    Additional Information:    Not applicable

## 2013-05-06 ENCOUNTER — Ambulatory Visit
Admit: 2013-05-06 | Discharge: 2013-06-02 | Disposition: A | Discharge status: Other Acute Inpatient Hospital | Payer: Self-pay | Source: Ambulatory Visit | Attending: Psychiatry | Admitting: Psychiatry

## 2013-05-17 ENCOUNTER — Ambulatory Visit: Payer: Self-pay | Admitting: Nurse Practitioner

## 2013-05-17 DIAGNOSIS — G35 Multiple sclerosis: Secondary | ICD-10-CM

## 2013-05-18 ENCOUNTER — Ambulatory Visit: Payer: Self-pay
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
## 2013-05-19 ENCOUNTER — Encounter: Payer: Self-pay | Admitting: Primary Care

## 2013-05-19 MED ORDER — CETIRIZINE HCL 10 MG PO TABS *I*
10.0000 mg | ORAL_TABLET | Freq: Every day | ORAL | Status: AC
Start: 2013-05-19 — End: 2013-11-15

## 2013-05-19 MED ORDER — DICYCLOMINE HCL 20 MG PO TABS *I*
20.0000 mg | ORAL_TABLET | Freq: Four times a day (QID) | ORAL | Status: DC
Start: 2013-05-19 — End: 2013-05-27

## 2013-05-19 NOTE — Progress Notes (Signed)
PATIENT NAME:  Kaylee Harris   MRN: [id number]   DOB: 1975-06-12   DATE OF SERVICE: 05/17/2013          REASON FOR VISIT  Follow up three month appointment to discuss medical management and current symptoms.   HPI  Patient is accompanied by her partner [name] who both state that she is very worried how much weight she has gained in the last three months. She is also concerned that taking both Cymbalta and Wellbutrin is causing her to gain some of the weight and she would like to stop one of them and continue on with only one medication. She feels that Cymbalta is helping both her Fibromyalgia pain and depression and would like to continue with this. She also feels that her partner has had success with Xanax and would like to try the medication to treat her current anxiety. Both she and her partner have a great deal of financial and personal stress and she feels a medication that she could take as needed would help. She has an appointment with her Psychiatrist tomorrow and will address her weight issues and anxiety at that appointment. She also continues to have migraine headaches at least three times per week and would again like a referral to a headache specialist. She has yet to hear from Dr Honor Junes office about a future appointment.   ALLERGIES  Allergies   Allergen Reactions    Morphine Anaphylaxis    Trazodone Anaphylaxis        CURRENT MEDS:   Current Outpatient Prescriptions   Medication Sig    cetirizine (ZYRTEC) 10 MG tablet Take 1 tablet (10 mg total) by mouth daily    dicyclomine (BENTYL) 20 MG tablet Take 1 tablet (20 mg total) by mouth 4 times daily (before meals and nightly)    DULoxetine (CYMBALTA) 60 MG capsule Take 1 capsule (60 mg total) by mouth daily    glimepiride (AMARYL) 4 MG tablet Take 1 tablet (4 mg total) by mouth daily (with breakfast)    lancets Brand Free Style Lite; Use 2 times per day as directed for blood glucose testing.    FREESTYLE LITE test strip Use BID as directed for 250.02     Alcohol Swabs (ALCOHOL WIPES) PADS Use BID for BG check    topiramate (TOPAMAX) 50 MG tablet Take 1 tablet (50 mg total) by mouth 2 times daily    blood glucose monitor system Brand: cheapest brand available per her insurance.  Use as directed.    VENTOLIN HFA 108 (90 BASE) MCG/ACT inhaler Inhale 1-2 puffs into the lungs every 4-6 hours as needed for Wheezing    fluticasone (FLONASE) 50 MCG/ACT nasal spray 1 spray by Nasal route daily    gabapentin (NEURONTIN) 600 MG tablet Take 1 tablet (600 mg total) by mouth 3 times daily    Ascorbic Acid 500 MG CAPS Take 1 capful by mouth 2 times daily    diltiazem (CARDIZEM) 30 MG tablet Take 30 mg by mouth 2 times daily    terbinafine (LAMISIL) 250 MG tablet Take 250 mg by mouth daily    atorvastatin (LIPITOR) 40 MG tablet Take 40 mg by mouth daily (with dinner)    ranolazine (RANEXA) 500 MG 12 hr tablet Take 500 mg by mouth 2 times daily   Swallow whole. Do not crush, break, or chew.    theophylline (THEODUR) 200 MG 12 hr tablet Take 200 mg by mouth 2 times daily   Do not crush or  chew. May be divided.    TURMERIC CURCUMIN PO Take 400 mg by mouth daily    Bioflavonoid Products (ESTER C PO) Take 500 mg by mouth daily    naproxen sodium (ANAPROX) 550 MG tablet Take 550 mg by mouth 2 times daily (with meals)    cholecalciferol (VITAMIN D) 1000 UNIT capsule Take 1,000 Units by mouth daily    cyanocobalamin 500 MCG tablet Take 500 mcg by mouth daily    omeprazole (PRILOSEC) 20 MG capsule Take 1 capsule (20 mg total) by mouth daily (before breakfast)    famciclovir (FAMVIR) 500 MG tablet Take 1 tablet (500 mg total) by mouth 3 times daily as needed    Non-System Medication The above patient is followed in our clinic and cannot resume work permanently.    buPROPion (WELLBUTRIN SR) 150 MG 12 hr tablet Take 1 tablet (150 mg total) by mouth 2 times daily   Swallow whole. Do not crush, break, or chew.    ondansetron (ZOFRAN) 8 MG tablet Take 1 tablet (8 mg  total) by mouth 3 times daily as needed for Nausea    SUMAtriptan (IMITREX) 50 MG tablet Take 1 tablet (50 mg total) by mouth as needed for Migraine   Take at onset of headache. May repeat once in 2 hours.    SUMAtriptan kit (IMITREX STATDOSE) 6 MG/0.5ML injection Inject 0.5 mLs (6 mg total) into the skin once as needed for Migraine   May repeat once after 1 hour if needed.        PERSONAL Hx:  Neuropathy and fibromyalgia along with more weight gain and anxiety in the last three months. She is requesting some changes to her medications so she does not gain any more weight and hopefully lose some weight. The weight gain has made her feel more depressed and cannot do anything physical right now with the [place]. She also is requested an appointment to see our headache specialist Dr Donne Anon as she feels her headaches seem to be getting worse and not sure if it is related to cold weather or personal stress. She has not had any new symptoms of late    ROS:  A 10 point review of systems was done including those positive findings included in the HPI. Patient reports the above mentioned symptoms.     VITAL SIGNS:  Wt Readings from Last 3 Encounters:   04/26/13 129.184 kg (284 lb 12.8 oz)   03/24/13 126.1 kg (278 lb)   01/08/13 126.1 kg (278 lb)     Temp Readings from Last 3 Encounters:   04/26/13 36.6 C (97.8 F) Temporal     BP Readings from Last 3 Encounters:   04/26/13 118/74   03/24/13 116/90   01/08/13 135/65     Pulse Readings from Last 3 Encounters:   04/26/13 88   03/24/13 80   01/08/13 75      There were no vitals filed for this visit.     PHYSICAL EXAM:  On physical examination she was alert and oriented times three. There are normal pursuits with normal facial sensation and no finger to nose ataxia bilaterally. Upper strength is (R/L) 5/5. Lower strength is (R/L) 5/5 with dorsi flexion 5/5 There is normal tone in both legs and decrease in vibratory sensation in both legs A timed 8 meter walk took  4:88 seconds.     ASSESSMENT:  Neuropathy in lower legs, more weight gain in the last three months and more migraines in the  last two months and patient not sure if related to personal and financial stress. I suggested she wean off the Wellbutrin gradually and start Xanax 0.25 mg twice per day for anxiety and continue on the Cymbalta at the current dose. She will address her issues with her psychiatrist tomorrow and the writers suggestions. I made no other changes to her medications. She should be considered permanently disabled She has a follow up appointment in three months. Thank you for allowing [place] to participate in the care of your patient.

## 2013-05-25 ENCOUNTER — Telehealth: Payer: Self-pay | Admitting: Primary Care

## 2013-05-25 ENCOUNTER — Encounter: Payer: Self-pay | Admitting: Primary Care

## 2013-05-25 ENCOUNTER — Telehealth: Payer: Self-pay

## 2013-05-25 NOTE — Telephone Encounter (Signed)
Reviewed med list with Vaughan Basta.  [name], MD  [facility]  05/25/2013  11:02 AM

## 2013-05-25 NOTE — Telephone Encounter (Signed)
Clinical Management Note   Writer called patient's PCP regarding patient's question about her medications not working well. Writer spoke to Jordan at Dr. Tawanna Sat office and left a message requesting a call back to discuss a medication consult.

## 2013-05-25 NOTE — Telephone Encounter (Signed)
Clinical Management Note   Dr. Laurance Flatten called writer and patient's medications were discussed. Dr. Laurance Flatten informed writer that she had just started patient on Cymbalta in December and it was determined that a medication consult would be revisited in another month if patient's affect had not improved.

## 2013-05-25 NOTE — Telephone Encounter (Signed)
Kaylee Harris from behavior health calling-   Not sure on all the meds patient is taking- wanted to discuss with you   She can be reached at  [phone]

## 2013-05-26 ENCOUNTER — Encounter: Payer: Self-pay | Admitting: Primary Care

## 2013-05-27 ENCOUNTER — Ambulatory Visit: Payer: Self-pay

## 2013-05-27 MED ORDER — CHLORDIAZEPOXIDE-CLIDINIUM 5-2.5 MG PO CAPS *I*
1.0000 | ORAL_CAPSULE | Freq: Four times a day (QID) | ORAL | Status: DC
Start: 2013-05-27 — End: 2013-06-09

## 2013-05-27 NOTE — Progress Notes (Signed)
[facility]al Health Progress Note     LENGTH OF SESSION: 50 minutes    Contact Type:  Location: On Site    Face to Face     Problem(s)/Goals Addressed from Treatment Plan:    Problem 1:   R PSY TP PROBLEM 04/20/2013   1ST TREATMENT PLAN PROBLEM Depression and anxiety       Goal for this problem:    R PSY TP GOAL 04/20/2013   1ST TREATMENT PLAN GOAL Learn PST skills to manage affect       Progress towards this goal: Problem worsening.  Comment Patient reports perseverating thoughts regarding self worth and not wanting to "be here".    Mental Status Exam:  APPEARANCE: Appears stated age, Well-groomed, Casual  ATTITUDE TOWARD INTERVIEWER: Cooperative  MOTOR ACTIVITY: WNL (within normal limits)  EYE CONTACT: Indirect  SPEECH: Pressured  AFFECT: Sad, Anxious and Depressed  MOOD: Anxious, Depressed and Sad  THOUGHT PROCESS: Intact  THOUGHT CONTENT: Negative Rumination  PERCEPTION: No evidence of hallucinations  ORIENTATION: Alert and Oriented X 3.  CONCENTRATION: Fair and Poor  MEMORY:   Recent: intact   Remote: intact  COGNITIVE FUNCTION: Average intelligence  JUDGMENT: Intact  IMPULSE CONTROL: Fair  INSIGHT: Fair and Poor    Risk Assessment:  ASSESSMENT OF RISK FOR SUICIDAL BEHAVIOR  Changes in risk for suicide from baseline Formulation of Risk and/or previous intake, including newly identified risk, if any: stressful life event  Violence risk was assessed and No Change noted from baseline formulation of risk and/or previous assessment.    Session Content/Interventions:  SESSION CONTENT: Patient talked about her mood, thought, and behaviors in the past week. Patient told Probation officer that her wife is unsure about whether she should call [facility] or take patient to the hospital. Patient said that she cries frequently, can't do ADLs, and had an episode yesterday in which she could not control her affect. Writer had patient's wife come into session so as to discuss course of action. Writer talked to patient and her wife about  Partial hospitalization as a higher level of care that would help patient learn skills and those skills would be reinforced daily. Patient agreed to writer making the referral with her wife's support of this plan.      INTERVENTIONS: Supportive counseling, safety assessment and planning, treatment planning    Plan:  Psychotherapy continues as described in care plan; plan remains the same.    NEXT APPT: 06/03/13 @2p .m.      Tresa Res, LMSW

## 2013-05-28 ENCOUNTER — Telehealth: Payer: Self-pay

## 2013-05-28 NOTE — Telephone Encounter (Signed)
Clidinum- chlordiazepoxide dose need a PA. Pharmacy ran plan Chlordiazerpoxide 10mg  1 a day and it was approved. Please  advise if this is ok, and if it is please proved new Rx.

## 2013-05-28 NOTE — Telephone Encounter (Signed)
I called the patient and lm asking her to give our office a call back. Her script is ready. We need to know if she wants her script mailed to the pharm or if she wants to pick it up. The script is in the patient pick up folder.

## 2013-05-31 ENCOUNTER — Ambulatory Visit: Payer: Self-pay | Admitting: Primary Care

## 2013-05-31 ENCOUNTER — Encounter: Payer: Self-pay | Admitting: Gastroenterology

## 2013-05-31 ENCOUNTER — Encounter: Payer: Self-pay | Admitting: Primary Care

## 2013-05-31 VITALS — BP 126/82 | HR 96 | Temp 97.5°F | Ht 68.0 in | Wt 286.4 lb

## 2013-05-31 DIAGNOSIS — F32A Depression, unspecified: Secondary | ICD-10-CM

## 2013-05-31 DIAGNOSIS — K589 Irritable bowel syndrome without diarrhea: Secondary | ICD-10-CM | POA: Insufficient documentation

## 2013-05-31 DIAGNOSIS — Z0000 Encounter for general adult medical examination without abnormal findings: Secondary | ICD-10-CM

## 2013-05-31 DIAGNOSIS — M797 Fibromyalgia: Secondary | ICD-10-CM

## 2013-05-31 DIAGNOSIS — IMO0002 Reserved for concepts with insufficient information to code with codable children: Secondary | ICD-10-CM

## 2013-05-31 LAB — HM DIABETES FOOT EXAM

## 2013-05-31 NOTE — Telephone Encounter (Signed)
No, please start PA form.    Kaylee Drilling, MD  [facility]  05/31/2013  2:17 PM

## 2013-05-31 NOTE — Progress Notes (Signed)
[facility]    SUBJECTIVE    Pt is here to discuss:    1. F/u fibromyalgia and depression - Initially thought Cymbalta was helping mood, but now with acute concerns. Asking if Xanax would be appropriate. Describes panic attack over weekend: felt like she was blowing up, became acutely agitated, crying uncontrolably, hyperventilating. Used an ice pack in hand and deep breathing. Has been talking about it with her therapist, and admitted to an increase in suicidal ideation.  Is awaiting involvement with a partial hospitalization program later this week.  Denies specific suicidal plan, but thoughts recur and are concerning to both patient at her wife [name].     Describes fibromyalgia is also flaring; has pain in legs and back.     3. F/u DM2 - Admits to numbness in her b/l 2nd toes. Denies specific concern about injury or infection. Tolerating Amaryl. Not addressed in entirety due to acuity of other concern.    4. F/u IBS - Running out of librax. Asking if we can complete PA for it as she is concerned about side effects / contraindications of Bentyl and her underling syncope.       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    FREESTYLE LITE test strip Use BID as directed for 250.02    topiramate (TOPAMAX) 50 MG tablet Take 1 tablet (50 mg total) by mouth 2 times daily    blood glucose monitor system Brand: cheapest brand available per her insurance.  Use as directed.    VENTOLIN HFA 108 (90 BASE) MCG/ACT inhaler Inhale 1-2 puffs into the lungs every 4-6 hours as needed for Wheezing    fluticasone (FLONASE) 50 MCG/ACT nasal spray 1 spray by Nasal route daily    gabapentin (NEURONTIN) 600 MG tablet Take 1 tablet (600 mg total) by mouth 3 times daily    terbinafine (LAMISIL) 250 MG tablet Take 250 mg by mouth daily    atorvastatin (LIPITOR) 40 MG tablet Take 40 mg by mouth daily (with dinner)    ranolazine (RANEXA) 500 MG 12 hr tablet Take 500 mg by mouth 2 times daily   Swallow whole. Do not crush, break, or chew.    theophylline (THEODUR) 200 MG 12 hr tablet Take 200 mg by mouth 2 times daily   Do not crush or chew. May be divided.    Bioflavonoid Products (ESTER C PO) Take 500 mg by mouth daily    naproxen sodium (ANAPROX) 550 MG tablet Take 550 mg by mouth 2 times daily (with meals)    cholecalciferol (VITAMIN D) 1000 UNIT capsule Take 1,000 Units  by mouth daily    omeprazole (PRILOSEC) 20 MG capsule Take 1 capsule (20 mg total) by mouth daily (before breakfast)    famciclovir (FAMVIR) 500 MG tablet Take 1 tablet (500 mg total) by mouth 3 times daily as needed    buPROPion (WELLBUTRIN SR) 150 MG 12 hr tablet Take 1 tablet (150 mg total) by mouth 2 times daily   Swallow whole. Do not crush, break, or chew.    ondansetron (ZOFRAN) 8 MG tablet Take 1 tablet (8 mg total) by mouth 3 times daily as needed for Nausea    SUMAtriptan (IMITREX) 50 MG tablet Take 1 tablet (50 mg total) by mouth as needed for Migraine   Take at onset of headache. May repeat once in 2 hours.    SUMAtriptan kit (IMITREX STATDOSE) 6 MG/0.5ML injection Inject 0.5 mLs (6 mg total) into the skin once as needed for Migraine   May repeat once  after 1 hour if needed.    Alcohol Swabs (ALCOHOL WIPES) PADS Use BID for BG check    diltiazem (CARDIZEM) 30 MG tablet Take 30 mg by mouth 2 times daily    cyanocobalamin 500 MCG tablet Take 500 mcg by mouth daily    Non-System Medication The above patient is followed in our clinic and cannot resume work permanently.       ROS  Per HPI      OBJECTIVE  Filed Vitals:    05/31/13 1641   BP: 126/82   Pulse: 96   Temp: 36.4 C (97.5 F)   TempSrc: Temporal   Height: 1.727 m (5' 8")   Weight: 129.91 kg (286 lb 6.4 oz)     Body mass index is 43.56 kg/(m^2).      General: Obese Caucasian female, NAD  Psych: Mild emotional lability but not as tearful as previous encounters. Does not maintain eye contact. Wife at her side, very supportive and engaged with patient.   Eyes:. PERRLA. EOMI. Conjunctiva pink, without swelling or exudate. Lids normal.   Ext: No evidence of injury or ischemia or infection. Sensation to monofilament testing lack in plantar aspect of distal 2nd toes b/l.       ASSESSMENT & PLAN  1. DM (diabetes mellitus), type 2, uncontrolled  Did not address in entirety due to importance of other two issues. Sent micral screen and will schedule ophthal exam at next convenience.  - Microalbumin, Urine, Random; Future  - Microalbumin, Urine, Random  - AMB REFERRAL TO OPHTHALMOLOGY    2. Depression  3. Fibromyalgia  Uncontrolled, ~6 weeks into treatment with SNRI. Encouraged to continue and allow for more time. Reviewed my hesitancy to give a benzodiazepine given her underlying syncopal disorder, infrequency of acute anxiety and upcoming involvement with partial hospitalization program. Encouraged to use group setting and psych care to explore other options for anxiety treatment. Advised to call [facility] or our office if acute worsening of suicidal ideation prior to involvement with partial hosp program. Will f/u following this, ~3 weeks.    4. Healthcare maintenance  - Tdap (Adacel, Boostrix) (AMBULATORY USE  ONLY)    5. IBS (irritable bowel syndrome)  Rx / PA for librax completed today.         Follow-up: 3 weeks       [name], MD  [facility]  05/31/2013  4:58 PM        ______________________

## 2013-06-01 ENCOUNTER — Inpatient Hospital Stay
Admission: EM | Admit: 2013-06-01 | Disposition: A | Discharge status: Home / Self Care | Payer: Self-pay | Source: Ambulatory Visit | Attending: Psychiatry | Admitting: Psychiatry

## 2013-06-01 ENCOUNTER — Ambulatory Visit
Admit: 2013-06-01 | Discharge: 2013-06-02 | Disposition: A | Discharge status: Other Acute Inpatient Hospital | Payer: Self-pay | Source: Ambulatory Visit | Attending: Psychiatry | Admitting: Psychiatry

## 2013-06-01 ENCOUNTER — Ambulatory Visit: Payer: Self-pay

## 2013-06-01 HISTORY — DX: Angina pectoris, unspecified: I20.9

## 2013-06-01 LAB — POCT GLUCOSE: Glucose POCT: 127 mg/dL — ABNORMAL HIGH (ref 60–99)

## 2013-06-01 MED ORDER — ONDANSETRON HCL 4 MG PO TABS *I*
8.0000 mg | ORAL_TABLET | Freq: Three times a day (TID) | ORAL | Status: DC | PRN
Start: 2013-06-01 — End: 2013-06-09
  Administered 2013-06-03 – 2013-06-06 (×3): 8 mg via ORAL
  Filled 2013-06-01 (×2): qty 1
  Filled 2013-06-01: qty 2
  Filled 2013-06-01 (×2): qty 1
  Filled 2013-06-01: qty 2
  Filled 2013-06-01: qty 1

## 2013-06-01 MED ORDER — CHLORDIAZEPOXIDE-CLIDINIUM 5-2.5 MG PO CAPS *I*
1.0000 | ORAL_CAPSULE | Freq: Four times a day (QID) | ORAL | Status: DC
Start: 2013-06-01 — End: 2013-06-09
  Administered 2013-06-02 – 2013-06-09 (×29): 1 via ORAL
  Filled 2013-06-01 (×30): qty 1

## 2013-06-01 MED ORDER — SUMATRIPTAN SUCCINATE 50 MG PO TABS *I*
50.0000 mg | ORAL_TABLET | ORAL | Status: DC | PRN
Start: 2013-06-01 — End: 2013-06-09
  Administered 2013-06-04: 50 mg via ORAL
  Filled 2013-06-01 (×11): qty 1

## 2013-06-01 MED ORDER — TOPIRAMATE 25 MG PO TABS *I*
50.0000 mg | ORAL_TABLET | Freq: Two times a day (BID) | ORAL | Status: DC
Start: 2013-06-01 — End: 2013-06-09
  Administered 2013-06-02 – 2013-06-09 (×16): 50 mg via ORAL
  Filled 2013-06-01 (×19): qty 2

## 2013-06-01 MED ORDER — GLIMEPIRIDE 2 MG PO TABS *I*
4.0000 mg | ORAL_TABLET | Freq: Every day | ORAL | Status: DC
Start: 2013-06-02 — End: 2013-06-09
  Administered 2013-06-02 – 2013-06-09 (×8): 4 mg via ORAL
  Filled 2013-06-01 (×11): qty 2

## 2013-06-01 MED ORDER — THEOPHYLLINE CR 200 MG PO TB12 *I*
200.0000 mg | ORAL_TABLET | Freq: Two times a day (BID) | ORAL | Status: DC
Start: 2013-06-01 — End: 2013-06-05
  Administered 2013-06-02 – 2013-06-05 (×5): 200 mg via ORAL
  Filled 2013-06-01 (×11): qty 1

## 2013-06-01 MED ORDER — BUPROPION HCL 150 MG PO TB12 *I*
150.0000 mg | ORAL_TABLET | Freq: Two times a day (BID) | ORAL | Status: DC
Start: 2013-06-01 — End: 2013-06-03
  Administered 2013-06-01 – 2013-06-03 (×4): 150 mg via ORAL
  Filled 2013-06-01 (×5): qty 1

## 2013-06-01 MED ORDER — FLUTICASONE PROPIONATE 50 MCG/ACT NA SUSP *I*
1.0000 | Freq: Every day | NASAL | Status: DC
Start: 2013-06-02 — End: 2013-06-09
  Administered 2013-06-02 – 2013-06-09 (×8): 1 via NASAL
  Filled 2013-06-01 (×2): qty 16

## 2013-06-01 MED ORDER — OMEPRAZOLE 20 MG PO CPDR *I*
20.0000 mg | DELAYED_RELEASE_CAPSULE | Freq: Every morning | ORAL | Status: DC
Start: 2013-06-02 — End: 2013-06-09
  Administered 2013-06-02 – 2013-06-09 (×8): 20 mg via ORAL
  Filled 2013-06-01 (×9): qty 1

## 2013-06-01 MED ORDER — CETIRIZINE HCL 5 MG PO TABS *I*
10.0000 mg | ORAL_TABLET | Freq: Every day | ORAL | Status: DC
Start: 2013-06-02 — End: 2013-06-09
  Administered 2013-06-02 – 2013-06-09 (×8): 10 mg via ORAL
  Filled 2013-06-01 (×10): qty 2

## 2013-06-01 MED ORDER — GABAPENTIN 300 MG PO CAPS
600.0000 mg | ORAL_CAPSULE | Freq: Three times a day (TID) | ORAL | Status: DC
Start: 2013-06-01 — End: 2013-06-09
  Administered 2013-06-01 – 2013-06-09 (×23): 600 mg via ORAL
  Filled 2013-06-01 (×23): qty 2

## 2013-06-01 MED ORDER — LORAZEPAM 1 MG PO TABS *I*
1.0000 mg | ORAL_TABLET | Freq: Once | ORAL | Status: AC
Start: 2013-06-01 — End: 2013-06-01
  Administered 2013-06-01: 1 mg via ORAL
  Filled 2013-06-01: qty 1

## 2013-06-01 MED ORDER — NAPROXEN 250 MG PO TABS *I*
500.0000 mg | ORAL_TABLET | Freq: Two times a day (BID) | ORAL | Status: DC
Start: 2013-06-01 — End: 2013-06-01
  Administered 2013-06-01: 500 mg via ORAL
  Filled 2013-06-01: qty 2

## 2013-06-01 MED ORDER — VITAMIN B-12 100 MCG PO TABS *I*
500.0000 ug | ORAL_TABLET | Freq: Every day | ORAL | Status: DC
Start: 2013-06-02 — End: 2013-06-09
  Administered 2013-06-02 – 2013-06-09 (×8): 500 ug via ORAL
  Filled 2013-06-01 (×10): qty 5

## 2013-06-01 MED ORDER — DULOXETINE HCL 30 MG PO CPEP *I*
60.0000 mg | DELAYED_RELEASE_CAPSULE | Freq: Every day | ORAL | Status: DC
Start: 2013-06-02 — End: 2013-06-09
  Administered 2013-06-02 – 2013-06-09 (×8): 60 mg via ORAL
  Filled 2013-06-01 (×10): qty 2

## 2013-06-01 MED ORDER — CHOLECALCIFEROL 1000 UNIT PO CAPS *WRAPPED*
1000.0000 [IU] | ORAL_CAPSULE | Freq: Every day | ORAL | Status: DC
Start: 2013-06-02 — End: 2013-06-09
  Administered 2013-06-02 – 2013-06-09 (×8): 1000 [IU] via ORAL
  Filled 2013-06-01 (×10): qty 1

## 2013-06-01 MED ORDER — TERBINAFINE HCL 250 MG PO TABS *I*
250.0000 mg | ORAL_TABLET | Freq: Every day | ORAL | Status: DC
Start: 2013-06-01 — End: 2013-06-09
  Administered 2013-06-01 – 2013-06-09 (×9): 250 mg via ORAL
  Filled 2013-06-01 (×11): qty 1

## 2013-06-01 MED ORDER — NAPROXEN SODIUM 275 MG PO TABS *I*
550.0000 mg | ORAL_TABLET | Freq: Two times a day (BID) | ORAL | Status: DC
Start: 2013-06-02 — End: 2013-06-09
  Administered 2013-06-02 – 2013-06-09 (×15): 550 mg via ORAL
  Filled 2013-06-01 (×20): qty 2

## 2013-06-01 MED ORDER — RANOLAZINE 500 MG PO TB12 *I*
500.0000 mg | ORAL_TABLET | Freq: Two times a day (BID) | ORAL | Status: DC
Start: 2013-06-01 — End: 2013-06-09
  Administered 2013-06-01 – 2013-06-09 (×16): 500 mg via ORAL
  Filled 2013-06-01 (×19): qty 1
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
## 2013-06-01 NOTE — ED Notes (Signed)
MHA'd by Emergency planning/management officer. C/o SI. Plan to overdose. Denies HI, ETOH, illicit drug use or ingestion.
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
## 2013-06-01 NOTE — ED Notes (Signed)
CPEP Charge Nurse Note    Report received from [facility], via EMS, Gene Autry  accompanied by Surveyor, quantity Complaint: Referred from [facility] intake today for SI with plan to OD. Patient was referred to [facility] by therapist Tresa Res  Chief Complaint   Patient presents with    Suicidal       Allergies:  Allergies as of 06/01/2013 - Up to Date 06/01/2013   Allergen Reaction Noted    Morphine Anaphylaxis 01/08/2013    Trazodone Anaphylaxis 01/08/2013       Past Medical History   Diagnosis Date    Diabetes mellitus      Previously on SU and metformin, now diet controlled    Asthma     Allergy history unknown     GERD (gastroesophageal reflux disease)     Dysfunctional uterine bleeding     Diverticulitis 09/2010    Sebaceous cyst of breast      right axilla       Substance use: none    Ingestion: No    Medical clearance: No no    Vital signs:  BP 144/100   Pulse 93   Temp(Src) 37.4 C (99.3 F) (Temporal)   Resp 18   Ht 1.727 m (5' 7.99")   Wt 90.719 kg (200 lb)   BMI 30.42 kg/m2   SpO2 98%  Last Nursing documented pain:  0-10 Scale: 0 (06/01/13 1302)      Pre-Arrival Notifications: No    Patient location: EMS    Wynetta Fines, RN, 1:16 PM
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
## 2013-06-01 NOTE — ED Notes (Signed)
Pt wife Barbera Setters is taking her purse and cell phone home leaving her shoes here.

## 2013-06-01 NOTE — ED Notes (Signed)
CPEP Triage Note   Pt here voluntarily with Wife. Pt with SI and plan to OD. Has hx of OD 2012. Pt was at intake at [facility] today and sent for evaluation. Pt cooperative at Triage.     History: Anxiety/Depression/Bipolar      Patient is oriented to unit and CPEP evaluation process: Yes    Patient is accompanied by: family    Patient with MHA: No    Chief Complaint:   Chief Complaint   Patient presents with    Suicidal       PMH:   Past Medical History   Diagnosis Date    Diabetes mellitus      Previously on SU and metformin, now diet controlled    Asthma     Allergy history unknown     GERD (gastroesophageal reflux disease)     Dysfunctional uterine bleeding     Diverticulitis 09/2010    Sebaceous cyst of breast      right axilla    Anginal pain        PSH:   Past Surgical History   Procedure Laterality Date    Dilation and curettage of uterus  2005, 2007     x2 for menorraghia    Tonsillectomy      Cardiac catheterization  09/2011     negative    Loop recorder  Feb 03 2014    Arthroscopic shoulder surgery Right 2010     Related to lifting       Social History:  reports that she has quit smoking. Her smoking use included Cigarettes. She has a 1.5 pack-year smoking history. She has never used smokeless tobacco. She reports that  drinks alcohol. She reports that she currently engages in sexual activity. She is homosexual. She reports that she does not use illicit drugs.    Military History: Is the patient currently in the [place] military or has been on active duty in the past? no    Current pharmacy:   [id number] - [place]. AT N/A  [address] [place] [place] [address]  [place] [address]  Phone: [phone] Fax: [phone]    [id number] - [place].  [place]  [place] [address]  Phone: [phone] Fax: [phone]      Current Providers: Name: Vaughan Basta Harrison/[facility]    Pain assessment: Last Nursing documented pain:  0-10  Scale: 7 (06/01/13 1452)      BP 144/100   Pulse 93   Temp(Src) 37.4 C (99.3 F) (Temporal)   Resp 18   Ht 1.727 m (5' 7.99")   Wt 90.719 kg (200 lb)   BMI 30.42 kg/m2   SpO2 98%    Known infestation issues : no  Decontamination and isolation protocol required: no    Behavioral presentation during triage: Cooperative    Assessment for milieu management:   Requires medication: no   Requires intervention: no   Admits to or appears intoxicated: no  Immediate needs: none  Patient offered food, refreshment: Yes     Urine for tox screen obtained:no  Medication reconciliation:     Completed with meds reviewed and updated: Yes    Edwyna Shell, RN, 2:53 PM

## 2013-06-02 LAB — CBC AND DIFFERENTIAL
Baso # K/uL: 0 10*3/uL (ref 0.0–0.1)
Basophil %: 0.3 % (ref 0.1–1.2)
Eos # K/uL: 0.1 10*3/uL (ref 0.0–0.4)
Eosinophil %: 1.2 % (ref 0.7–5.8)
Hematocrit: 42 % (ref 34–45)
Hemoglobin: 13.7 g/dL (ref 11.2–15.7)
Lymph # K/uL: 3.1 10*3/uL (ref 1.2–3.7)
Lymphocyte %: 25.6 % (ref 19.3–51.7)
MCH: 29 pg/cell (ref 26–32)
MCHC: 33 g/dL (ref 32–36)
MCV: 90 fL (ref 79–95)
Mono # K/uL: 0.8 10*3/uL (ref 0.2–0.9)
Monocyte %: 6.6 % (ref 4.7–12.5)
Neut # K/uL: 7.9 10*3/uL — ABNORMAL HIGH (ref 1.6–6.1)
Platelets: 366 10*3/uL (ref 160–370)
RBC: 4.7 MIL/uL (ref 3.9–5.2)
RDW: 13.7 % (ref 11.7–14.4)
Seg Neut %: 66.3 % (ref 34.0–71.1)
WBC: 12 10*3/uL — ABNORMAL HIGH (ref 4.0–10.0)

## 2013-06-02 LAB — COMPREHENSIVE METABOLIC PANEL
ALT: 18 U/L (ref 0–35)
AST: 15 U/L (ref 0–35)
Albumin: 4.6 g/dL (ref 3.5–5.2)
Alk Phos: 95 U/L (ref 35–105)
Anion Gap: 18 — ABNORMAL HIGH (ref 7–16)
Bilirubin,Total: 0.3 mg/dL (ref 0.0–1.2)
CO2: 19 mmol/L — ABNORMAL LOW (ref 20–28)
Calcium: 9.5 mg/dL (ref 8.8–10.2)
Chloride: 103 mmol/L (ref 96–108)
Creatinine: 0.96 mg/dL — ABNORMAL HIGH (ref 0.51–0.95)
GFR,Black: 86 *
GFR,Caucasian: 75 *
Glucose: 162 mg/dL — ABNORMAL HIGH (ref 60–99)
Lab: 14 mg/dL (ref 6–20)
Potassium: 4.1 mmol/L (ref 3.3–5.1)
Sodium: 140 mmol/L (ref 133–145)
Total Protein: 7.3 g/dL (ref 6.3–7.7)

## 2013-06-02 LAB — MICROALBUMIN, URINE, RANDOM
Creatinine,UR: 79 mg/dL (ref 20–300)
Microalb/Creat Ratio: 22 mg MA/g CR (ref 0.0–29.9)
Microalbumin,UR: 1.74 mg/dL

## 2013-06-02 LAB — T4, FREE: Free T4: 1 ng/dL (ref 0.9–1.7)

## 2013-06-02 LAB — TSH: TSH: 1.6 u[IU]/mL (ref 0.27–4.20)

## 2013-06-02 MED ORDER — ACETAMINOPHEN 325 MG PO TABS *I*
650.0000 mg | ORAL_TABLET | ORAL | Status: DC | PRN
Start: 2013-06-02 — End: 2013-06-09
  Administered 2013-06-02: 650 mg via ORAL

## 2013-06-02 MED ORDER — MAGNESIUM HYDROXIDE 400 MG/5ML PO SUSP *I*
30.0000 mL | Freq: Every day | ORAL | Status: DC | PRN
Start: 2013-06-02 — End: 2013-06-09

## 2013-06-02 MED ORDER — LORAZEPAM 1 MG PO TABS *I*
1.0000 mg | ORAL_TABLET | Freq: Two times a day (BID) | ORAL | Status: DC | PRN
Start: 2013-06-02 — End: 2013-06-04
  Administered 2013-06-02 – 2013-06-03 (×4): 1 mg via ORAL
  Filled 2013-06-02 (×4): qty 1

## 2013-06-02 MED ORDER — LORAZEPAM 1 MG PO TABS *I*
1.0000 mg | ORAL_TABLET | Freq: Once | ORAL | Status: AC
Start: 2013-06-02 — End: 2013-06-02
  Administered 2013-06-02: 1 mg via ORAL
  Filled 2013-06-02: qty 1

## 2013-06-02 MED ORDER — ALBUTEROL SULFATE HFA 108 (90 BASE) MCG/ACT IN AERS *I*
2.0000 | INHALATION_SPRAY | RESPIRATORY_TRACT | Status: DC | PRN
Start: 2013-06-02 — End: 2013-06-09
  Administered 2013-06-06: 2 via RESPIRATORY_TRACT
  Filled 2013-06-02: qty 8

## 2013-06-02 MED ORDER — ALUM & MAG HYDROXIDE-SIMETH 200-200-20 MG/5ML PO SUSP *I*
30.0000 mL | Freq: Three times a day (TID) | ORAL | Status: DC | PRN
Start: 2013-06-02 — End: 2013-06-09

## 2013-06-02 NOTE — Telephone Encounter (Signed)
PA was faxed 06/01/13

## 2013-06-02 NOTE — ED Notes (Signed)
Pt calm and cooperative throughout shift. Appears in no current distress and voices no concerns at this time. Will continue to monitor, safety checks maintained.

## 2013-06-02 NOTE — ED Notes (Signed)
Pt calm and cooperative throughout shift. Slept soundly in room H. Appears in no current distress and voices no concerns. Will continue to monitor, safety checks maintained.
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
## 2013-06-03 ENCOUNTER — Ambulatory Visit: Payer: Self-pay

## 2013-06-03 LAB — POCT GLUCOSE: Glucose POCT: 178 mg/dL — ABNORMAL HIGH (ref 60–99)

## 2013-06-03 MED ORDER — BACITRACIN-NEOMYCIN-POLYMYXIN 400-5-5000 EX OINT *I*
TOPICAL_OINTMENT | Freq: Four times a day (QID) | CUTANEOUS | Status: DC
Start: 2013-06-03 — End: 2013-06-09
  Filled 2013-06-03: qty 15

## 2013-06-03 MED ORDER — HYDROXYZINE HCL 25 MG PO TABS *I*
25.0000 mg | ORAL_TABLET | Freq: Three times a day (TID) | ORAL | Status: DC | PRN
Start: 2013-06-03 — End: 2013-06-04
  Administered 2013-06-03 – 2013-06-04 (×3): 25 mg via ORAL
  Filled 2013-06-03 (×3): qty 1

## 2013-06-03 MED ORDER — BUPROPION HCL 100 MG PO TB12 *I*
200.0000 mg | ORAL_TABLET | Freq: Two times a day (BID) | ORAL | Status: DC
Start: 2013-06-03 — End: 2013-06-09
  Administered 2013-06-03 – 2013-06-09 (×12): 200 mg via ORAL
  Filled 2013-06-03 (×14): qty 2

## 2013-06-03 NOTE — Treatment Plan (Signed)
Inpatient Psychiatry Multi-Disciplinary Treatment Plan      Date of plan:  06/03/13    Treatment Plan Type: Initial    Hospital Problem List    Active Problems:    Depression      Strengths    Strengths (pick two): Actively engaged in treatment;Lives with partner or other family    Problems/Goals/Objectives section    Problem area #1: Depression    Individualized measurable manifestations:  Kaylee Harris reported her depression as 10 out of 10 with 10 equal to most severe symptoms for 4 weeks prior to admission.  Kaylee Harris reported having suicidal thoughts a 10 out of 10 equal to most severe symptoms for 4 weeks prior to admission.  Goal:     To decrease symptoms of depression.   To decrease suicidal ideation and improve mood.  To improve functioning and decrease depressive symptoms.  To decrease acute symptoms of depression.  To decrease acute symptoms of depression and suicidal ideation.    Objectives:  Kaylee Harris will report her depression as 5 or less out of 10 with 10 equal to most severe symptoms for 3 out of 5 consecutive days.              Criteria for discharge:     Reduction of symptoms of depression.   Improved functioning and decreased depression.  Decrease depression.  Decreased suicidal ideation and improved functioning.  Symptom stabilization.        Progress towards goal(s):  N/A - Initial plan        Anticipated date of achievement:  06/10/13       Treatment Modalities-Interventions:    MD/NP/Resident:  medication evaluation and adjustment    Nursing staff:  maintain physiological and psychological safety (see nursing care plan for specific goals and objectives relevant to risk for suicide/self-harm and risk for violence including safety plan development), education around coping skill development (see nursing care plan for specific goals and objectives relevant to coping skill development) and education around the role medications have in symptom stabilization (see nursing care plan for specific  goals and objectives related to medication treatment)    Social Worker:  discharge planning, family/social support system intervention, referral and linkage to resources, collateral contacts for continuity of care and psychosocial risk management    Activities Therapist: Provide opportunities for creative self-expression, Provide task oriented therapeutic activities to promote mindfulness & distraction skills, Provide physical opportunities to decrease stress and Provide psycho-educational opportunities to promote coping skills        Discharge Planning:    return home with spouse and reconnect with out pt providers, possibly will be referred to [facility]    Patient/Support System/Treatment Team Participating in Treatment Planning:    Patient participation in treatment planning: Yes      Patient Signature:  _________________________________  Date: _______________        Family/Support Network Participating in Geophysicist/field seismologist (if applicable):    Name:  ________________________________  Date:  ____________________    Name:  ________________________________  Date:  ____________________      Staff Participating in Psychologist, counselling of Treatment Plan:       Seth Bake, MD  06/03/2013/10:37 AM   Printed Name/Credentials                             Signature                                       Date/Time                Wynona Luna, LMSW                                                                                       06/03/2013/10:37 AM   Printed Name/Credentials                                Signature                                       Date/Time           Charolett Bumpers, RN                                                                                                06/03/2013/10:37 AM   Printed Name/Credentials                                Signature                                       Date/Time           Thana Farr, RN                                                                                                      06/03/2013/10:37 AM   Printed Name/Credentials                                Signature  Date/Time            Lesly Rubenstein, RN                                                                                                 06/03/2013/10:37 AM   Printed Name/Credentials                                Signature                                        Date/Time                                                                                                                                        06/03/2013/10:37 AM   Printed Name/Credentials                                Signature                                        Date/Time
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
## 2013-06-03 NOTE — Progress Notes (Cosign Needed)
Wellness Group    Patient participated in group and explained that she would like to focus more on herself as she moves forward. Patient shared with the group.     Harvin Hazel, MSW Intern
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
## 2013-06-03 NOTE — Progress Notes (Signed)
Utilization Management    Level of Care Inpatient as of the date 04/12/2014    Nala Kachel, RN     Pager: [phone]

## 2013-06-03 NOTE — Consults (Signed)
Brief Nutrition Note    History: 38 year old female with history of depression who presented on 1/27 via MHA from [facility] for SI and plan to overdose on medications.     Assessment: Nutrition consult triggered by nursing risk screen. Malnutrition screening tool score of 0 for changes in weight which patient has attributed to changes in medications. Chart reviewed. Current BMI 43.5- morbidly obese range. Documented weights (excluding 1/27 stated weight) weight has been stable since September 2014. Per nursing patient appears healthy. Ordered for Vitamin D and B-12 supplements. Given weight stability full nutrition assessment not warranted at this time. Please obtain actual weight and re-consult if significant change has occurred.     Please re-consult as needed,   Ammie Ferrier, [facility]  Pager [phone]

## 2013-06-03 NOTE — Plan of Care (Signed)
06/03/13 1932 Amandy Chubbuck, Vista Mink, RN - Registered Nurse]   Report given to Benjaman Lobe, Warehouse manager. Neosporin and DSD placed on R heel. Pt to have family bring in appropriate shoes to wear to protect feet. Visited with partner and family this shift. No episodes of syncope reported. Gait steady. No suicidal intent this shift. Med compliant. None Remove Edit Copy to Chart [06/03/13 1438 Raymond Azure, Vista Mink, RN - Registered Nurse]   Report given to Theo Dills, Camera operator. Pt remains with suicidal ideation. Had prn ativan for anxiety. Was nervous about going to groups,but was able to attended all unit groups. Napped in the afternoon.Her feet are very dry, and she cracked the heel of her right foot open--now has neosporin ordered. BG's have been ordered qam and qhs. No suicidal intent at this time.

## 2013-06-04 ENCOUNTER — Encounter: Payer: Self-pay | Admitting: Primary Care

## 2013-06-04 ENCOUNTER — Encounter: Payer: Self-pay | Admitting: Nurse Practitioner

## 2013-06-04 LAB — POCT GLUCOSE
Glucose POCT: 95 mg/dL (ref 60–99)
Glucose POCT: 98 mg/dL (ref 60–99)

## 2013-06-04 MED ORDER — ATORVASTATIN CALCIUM 40 MG PO TABS *I*
40.0000 mg | ORAL_TABLET | Freq: Every evening | ORAL | Status: DC
Start: 2013-06-04 — End: 2013-06-09
  Administered 2013-06-04 – 2013-06-08 (×5): 40 mg via ORAL
  Filled 2013-06-04 (×6): qty 1

## 2013-06-04 MED ORDER — HYDROXYZINE HCL 25 MG PO TABS *I*
25.0000 mg | ORAL_TABLET | Freq: Four times a day (QID) | ORAL | Status: DC | PRN
Start: 2013-06-04 — End: 2013-06-09
  Administered 2013-06-04 – 2013-06-09 (×19): 25 mg via ORAL
  Filled 2013-06-04 (×19): qty 1

## 2013-06-04 MED ORDER — MELATONIN 3 MG PO TABS *I*
3.0000 mg | ORAL_TABLET | Freq: Every evening | ORAL | Status: DC | PRN
Start: 2013-06-04 — End: 2013-06-07
  Administered 2013-06-04 – 2013-06-06 (×3): 3 mg via ORAL
  Filled 2013-06-04 (×4): qty 1

## 2013-06-04 MED ORDER — SUMATRIPTAN SUCCINATE 12 MG/ML SC SOLN
6.0000 mg | Freq: Once | SUBCUTANEOUS | Status: AC
Start: 2013-06-04 — End: 2013-06-04
  Administered 2013-06-04: 6 mg via SUBCUTANEOUS
  Filled 2013-06-04: qty 0.5

## 2013-06-04 NOTE — Plan of Care (Addendum)
Risk for Suicide     Patient Will Be Safe And Free From Injury Throughout Hospitalization Maintaining          Risk for Alteration of Physiological Status     Patient Will Report, Throughout Hospitalization, Relief Of Pain/Discomfort When Utilizing Comfort Measures Progressing towards goal        Risk for Suicide     Patient Will Express Sense Of Hope And Future Orientation By Discharge Progressing towards goal        Risk for Withdrawal     Patient Will Establish Adequate Nutrition, Hydration, And Elimination, With Nursing Assistance, By Day 2 Progressing towards goal        Patient has been in good behavioral control today. Denies any suicidal thoughts or self harm thoughts, though endorses ongoing depressive symptoms and anxiety. Has been OOR and attending groups, social with select peers. Eating and drinking adequate amounts. Pain continues but is normal amount and intensity for patient.    Addendum:  Patient has been visible and social on contacts. Continues with depressed mood and affect but denies any lethality issues. Parents and spouse in to visit, visit went well. Still c/o pain in her hips but hoping for relief from standing meds. Did request PRN Imitrex due to the beginning of a headache and not having her usual IM Imitrex available. No lethality issues.
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
## 2013-06-05 LAB — POCT GLUCOSE
Glucose POCT: 107 mg/dL — ABNORMAL HIGH (ref 60–99)
Glucose POCT: 126 mg/dL — ABNORMAL HIGH (ref 60–99)

## 2013-06-05 MED ORDER — HYDROCORTISONE 2.5 % RE CREA *I*
TOPICAL_CREAM | Freq: Two times a day (BID) | CUTANEOUS | Status: DC | PRN
Start: 2013-06-05 — End: 2013-06-09
  Filled 2013-06-05: qty 30

## 2013-06-05 MED ORDER — THEOPHYLLINE CR 200 MG PO TB12 *I*
200.0000 mg | ORAL_TABLET | Freq: Every day | ORAL | Status: DC
Start: 2013-06-06 — End: 2013-06-09
  Administered 2013-06-06 – 2013-06-09 (×4): 200 mg via ORAL
  Filled 2013-06-05 (×5): qty 1

## 2013-06-05 MED ORDER — SUMATRIPTAN SUCCINATE 12 MG/ML SC SOLN
6.0000 mg | Freq: Once | SUBCUTANEOUS | Status: AC | PRN
Start: 2013-06-05 — End: 2013-06-05
  Filled 2013-06-05: qty 0.5

## 2013-06-05 NOTE — Plan of Care (Signed)
[  06/05/13 2251 Gerasimos Plotts, Vista Mink, RN - Registered Nurse]   Report given to Roxan Diesel, Warehouse manager. Pt Had a difficult evening. Felt stressed after visits from family members. Her brother came with a new girlfriend she did not know, so she felt uncomfortable. Her brother does not understand mental illnes, and told her she takes too may pills. Her partner is against her confronting her father, because he may have cancer. Her mother did not feel well when she was up here, and is now in the Emergency Department with emphysema and other respitory issues. Although she feels upset, she also feels it takes away from working on her own problems, wjhen everyone else having issues. Used prns and was able to talk to staff. Continues with suicidal ideation, no intent at this time. Finds her partner her best support at this time. None Remove Edit Copy to Chart [06/05/13 2250 Aakash Hollomon, Vista Mink, RN - Registered Nurse]   Report given to Beryle Quant, Warehouse manager. Pt reports feeling depressed. Also is made anxious by another pt, who seeks her out and stays close to her. Discussed boundaries, and to seek out nurse if she feels trapped listening to others issues. She did have a prn atarax. Reported some rectal bleeding and said she can feel a "buldge". MD Ordered anusol cream, pt will report any further bleeding. Had visitors which she enjoyed, but also found stressful.

## 2013-06-06 ENCOUNTER — Ambulatory Visit
Admit: 2013-06-06 | Discharge: 2013-07-03 | Disposition: A | Discharge status: Home / Self Care | Payer: Self-pay | Source: Ambulatory Visit | Attending: Psychiatry | Admitting: Psychiatry

## 2013-06-06 LAB — POCT GLUCOSE
Glucose POCT: 108 mg/dL — ABNORMAL HIGH (ref 60–99)
Glucose POCT: 151 mg/dL — ABNORMAL HIGH (ref 60–99)

## 2013-06-06 MED ORDER — SUMATRIPTAN SUCCINATE 12 MG/ML SC SOLN
6.0000 mg | SUBCUTANEOUS | Status: DC | PRN
Start: 2013-06-06 — End: 2013-06-09
  Administered 2013-06-06: 6 mg via SUBCUTANEOUS
  Filled 2013-06-06 (×4): qty 0.5

## 2013-06-06 NOTE — Plan of Care (Signed)
Care Plan Goals      Risk for Suicide       Patient Will Express Sense Of Hope And Future Orientation By Discharge Progressing towards goal       Patient Will Be Safe And Free From Injury Throughout Hospitalization Maintaining          Risk for Withdrawal       Patient Will Establish Adequate Nutrition, Hydration, And Elimination, With Nursing Assistance, By Day 2 Progressing towards goal          Risk for Alteration of Physiological Status       Patient Will Report, Throughout Hospitalization, Relief Of Pain/Discomfort When Utilizing Comfort Measures Progressing towards goal          Pt. Remains with poor sleep. Stating she can only sleep for 3-4 hours. Says Melatonin is not enough to help her sleep. Requested PRN Atarax later stating she continues feeling anxious about her mother who is in the hospital. Feels "out of the loop". Enjoyed visit with wife and friend later. Requested PRN Albuterol Inhaler after lunch. Also requested PRN Zofran d/t migraine she felt coming on. Later given PRN Imitrex SQ injection. Awaiting effect.     Nyra Market, RN

## 2013-06-06 NOTE — Plan of Care (Signed)
Care Plan Goals      Risk for Suicide      • Patient Will Express Sense Of Hope And Future Orientation By Discharge Progressing towards goal      • Patient Will Be Safe And Free From Injury Throughout Hospitalization Maintaining          Risk for Withdrawal      • Patient Will Establish Adequate Nutrition, Hydration, And Elimination, With Nursing Assistance, By Day 2 Progressing towards goal          Risk for Alteration of Physiological Status      • Patient Will Report, Throughout Hospitalization, Relief Of Pain/Discomfort When Utilizing Comfort Measures Progressing towards goal          Pt. Remains with poor sleep. Stating she can only sleep for 3-4 hours. Says Melatonin is not enough to help her sleep. Requested PRN Atarax later stating she continues feeling anxious about her mother who is in the hospital. Feels "out of the loop". Enjoyed visit with wife and friend later. Requested PRN Albuterol Inhaler after lunch. Also requested PRN Zofran d/t migraine she felt coming on. Later given PRN Imitrex SQ injection. Awaiting effect.     Abriella Filkins, RN

## 2013-06-07 ENCOUNTER — Other Ambulatory Visit: Payer: Self-pay | Admitting: Gastroenterology

## 2013-06-07 ENCOUNTER — Ambulatory Visit
Admit: 2013-06-07 | Discharge: 2013-07-03 | Disposition: A | Discharge status: Home / Self Care | Payer: Self-pay | Source: Ambulatory Visit | Attending: Psychiatry | Admitting: Psychiatry

## 2013-06-07 LAB — POCT GLUCOSE
Glucose POCT: 102 mg/dL — ABNORMAL HIGH (ref 60–99)
Glucose POCT: 143 mg/dL — ABNORMAL HIGH (ref 60–99)
Glucose POCT: 162 mg/dL — ABNORMAL HIGH (ref 60–99)

## 2013-06-07 MED ORDER — MELATONIN 3 MG PO TABS *I*
6.0000 mg | ORAL_TABLET | Freq: Every evening | ORAL | Status: DC | PRN
Start: 2013-06-07 — End: 2013-06-09
  Administered 2013-06-07 – 2013-06-08 (×2): 6 mg via ORAL
  Filled 2013-06-07 (×3): qty 2
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
## 2013-06-07 NOTE — Plan of Care (Signed)
Risk for Suicide     Patient Will Be Safe And Free From Injury Throughout Hospitalization Maintaining          Risk for Alteration of Physiological Status     Patient Will Report, Throughout Hospitalization, Relief Of Pain/Discomfort When Utilizing Comfort Measures Progressing towards goal        Risk for Suicide     Patient Will Express Sense Of Hope And Future Orientation By Discharge Progressing towards goal        Risk for Withdrawal     Patient Will Establish Adequate Nutrition, Hydration, And Elimination, With Nursing Assistance, By Day 2 Progressing towards goal        Pt stated she was feeling better overall today. She was surprised that she feels comfortable enough to make eye contact and feels that things will get better.  Said she stopped "taking care" of CK -making sure she was eating enough, etc. Told pt that was not her responsibility and to focus on her own well being. Pt later complained of feeling dizzy with pain in left arm. Took vitals and BG- in doc flowsheets. C Dr. Wynetta Emery advised to have pt lay down and if still complaining of symptoms, perform an ecg. Walked pt to bed and upon return, was found to be sleeping deeply. Woke up approximately half an hour later, still complaining of dizziness.  ECG performed per dr orders. Advised pt to stay in bed if still feeling dizzy. Asked for prn d/t anxiety over dizzy episode. . Informed Dr. Wynetta Emery. Report given to Lesly Rubenstein, RN.
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
## 2013-06-07 NOTE — Progress Notes (Signed)
Psych Social Work Progress Note  Chart reviewed and Discussed patient with treatment team    Contact with: Patient and Inpatient team/staff (MD,NP, Nurse, Psych Tech)    Contact type: Individual contact with patient    Purpose of Contact: Referrals and Discharge planning/Continuity of care planning    Psychosocial Risk Factors Risk Factors: Yes, Lacks Income or payment mechanism: Yes, Lacks income or payment mechanism comments: SSDI application pending, Acute financial crisis  : Yes, Acute financial crisis/stress comments: living off of spouse's SSDI, Lack social supports : Yes, Lacks social supports comments: feels unsupported from family, Programmer, systems: Yes, Lacks transportation comments: unable to drive due to medical condition, Risk of Violence to Self: Yes, Risk of violence to self comments: history of OD    Narrative: Met with patient this morning and reviewed PHP with her.  Patient agreeable to fast track tomorrow and Wed. To try out the program.  Referral to [facility] completed and sent.  Patient stated she has family that could take her to [facility] but also has access to [facility] transportation.  Recommend setting up standing medicaid cab for PHP.    Next Steps: SW will continue to follow.  Hollie Beach, LMSW pager: [phone]
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
## 2013-06-08 ENCOUNTER — Ambulatory Visit: Payer: Self-pay

## 2013-06-08 LAB — POCT GLUCOSE
Glucose POCT: 104 mg/dL — ABNORMAL HIGH (ref 60–99)
Glucose POCT: 129 mg/dL — ABNORMAL HIGH (ref 60–99)

## 2013-06-08 LAB — EKG 12-LEAD
P: 36 degrees
QRS: 62 degrees
Rate: 86 {beats}/min
Severity: BORDERLINE
Severity: BORDERLINE
Statement: BORDERLINE
T: 35 degrees

## 2013-06-08 NOTE — Plan of Care (Signed)
Care Plan Goals      Risk for Suicide       Patient Will Be Safe And Free From Injury Throughout Hospitalization Maintaining       Patient Will Express Sense Of Hope And Future Orientation By Discharge Maintaining          Risk for Withdrawal       Patient Will Establish Adequate Nutrition, Hydration, And Elimination, With Nursing Assistance, By Day 2 Progressing towards goal          Risk for Alteration of Physiological Status       Patient Will Report, Throughout Hospitalization, Relief Of Pain/Discomfort When Utilizing Comfort Measures Maintaining          Pt. Anxious before leaving for PHP. Requested PRN Atarax with scheduled meds. Denied any active SI. Hopeful PHP would be helpful. Left around 0915. Arrived back at 1545. Stated PHP went "ok". Requested PRN Atarax. Currently visiting with spouse.    Nyra Market, RN
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
## 2013-06-08 NOTE — Treatment Plan (Signed)
Inpatient Psychiatry Multi-Disciplinary Treatment Plan      Date of plan:  06/08/13    Treatment Plan Type: Update    Hospital Problem List    Active Problems:    Depression      Strengths    Strengths (pick two): Actively engaged in treatment;Lives with partner or other family    Problems/Goals/Objectives section    Problem area #1: Depression    Individualized measurable manifestations:  Kaylee Harris reported her depression as 10 out of 10 with 10 equal to most severe symptoms for 4 weeks prior to admission.  Kaylee Harris reported having suicidal thoughts a 10 out of 10 equal to most severe symptoms for 4 weeks prior to admission.  Goal:     To decrease symptoms of depression.   To decrease suicidal ideation and improve mood.  To improve functioning and decrease depressive symptoms.  To decrease acute symptoms of depression.  To decrease acute symptoms of depression and suicidal ideation.    Objectives:  Kaylee Harris will report her depression as 5 or less out of 10 with 10 equal to most severe symptoms for 3 out of 5 consecutive days.              Criteria for discharge:     Reduction of symptoms of depression.   Improved functioning and decreased depression.  Decrease depression.  Decreased suicidal ideation and improved functioning.  Symptom stabilization.        Progress towards goal(s):  N/A - Initial plan and Problem resolving. Comment pt said her depression is decreaesd and is now more hopeful        Anticipated date of achievement:  06/09/13       Treatment Modalities-Interventions:    MD/NP/Resident:  medication evaluation and adjustment and No med changes    Nursing staff:  maintain physiological and psychological safety (see nursing care plan for specific goals and objectives relevant to risk for suicide/self-harm and risk for violence including safety plan development), education around coping skill development (see nursing care plan for specific goals and objectives relevant to coping skill development)  and education around the role medications have in symptom stabilization (see nursing care plan for specific goals and objectives related to medication treatment)    Social Worker:  discharge planning, family/social support system intervention, referral and linkage to resources, collateral contacts for continuity of care and psychosocial risk management    Activities Therapist: Provide opportunities for creative self-expression, Provide task oriented therapeutic activities to promote mindfulness & distraction skills, Provide physical opportunities to decrease stress and Provide psycho-educational opportunities to promote coping skills        Discharge Planning:    Adult Partial Hospitalization Services and Pt will start PHP today and will go again tomorrow    Patient/Support System/Treatment Team Participating in Treatment Planning:    Patient participation in treatment planning: Yes      Patient Signature:  _________________________________  Date: _______________        Family/Support Network Participating in Geophysicist/field seismologist (if applicable):    Name:  ________________________________  Date:  ____________________    Name:  ________________________________  Date:  ____________________      Staff Participating in Psychologist, counselling of Treatment Plan:       Seth Bake, MD  06/08/2013/9:53 AM   Printed Name/Credentials                             Signature                                       Date/Time                Wynona Luna, LMSW                                                                                       06/08/2013/9:53 AM   Printed Name/Credentials                                Signature                                       Date/Time           Charolett Bumpers, RN                                                                                                06/08/2013/9:53 AM   Printed Name/Credentials                                 Signature                                       Date/Time           Thana Farr, RN                                                                                                     06/08/2013/9:53 AM   Printed Name/Credentials                                Signature  Date/Time            Ledell Peoples, RN                                                                                                 06/08/2013/9:53 AM   Printed Name/Credentials                                Signature                                        Date/Time                                                                                                                                        06/08/2013/9:53 AM   Printed Name/Credentials                                Signature                                        Date/Time
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
## 2013-06-08 NOTE — Plan of Care (Signed)
Care Plan Goals      Risk for Suicide       Patient Will Be Safe And Free From Injury Throughout Hospitalization Maintaining       Patient Will Express Sense Of Hope And Future Orientation By Discharge Maintaining          Risk for Withdrawal       Patient Will Establish Adequate Nutrition, Hydration, And Elimination, With Nursing Assistance, By Day 2 Progressing towards goal          Risk for Alteration of Physiological Status       Patient Will Report, Throughout Hospitalization, Relief Of Pain/Discomfort When Utilizing Comfort Measures Maintaining          Pt. Brighter on contact this evening. Enjoying visit with wife. Excited about discharge tmrw. Said PHP went well despite it being overwhelming at times. Inquired about Atarax PRN possibly being increased d/t feeling minimal effects. Would like medication box sent home with belongings. Spent evening socializing with peers. Stated she is feeling "good". Was happy to see someone today at [facility] she saw prior to admission. Michela Pitcher this person noted a big improvement in her mood/thought process and this helped her realize the gains she has made. Denied any active safety concerns. Given patient survey.    Nyra Market, RN

## 2013-06-08 NOTE — Progress Notes (Signed)
Patient Active Problem List   Diagnosis Code    DM (diabetes mellitus), type 2, uncontrolled 250.02    Migraine with aura 346.00    Asthma 493.90    Hyperlipidemia 272.4    Fibromyalgia 729.1    Neurocardiogenic syncope 780.2    Depression 311    IUD (intrauterine device) in place V45.51    Skin lesion of face 709.9    IBS (irritable bowel syndrome) 564.1     No current facility-administered medications for this visit.     No current outpatient prescriptions on file.     Facility-Administered Medications Ordered in Other Visits   Medication    melatonin tablet 6 mg    SUMAtriptan Succinate (IMITREX) injection 6 mg    hydrocortisone (ANUSOL-HC) 2.5 % rectal cream    theophylline (THEODUR) 12 hr tablet 200 mg    hydrOXYzine HCl (ATARAX) tablet 25 mg    atorvastatin (LIPITOR) tablet 40 mg    neomycin-bacitracin-polymyxin (NEOSPORIN) ointment    buPROPion (WELLBUTRIN SR) 12 hr tablet 200 mg    albuterol (PROVENTIL, VENTOLIN, PROAIR HFA) inhaler 2 puff    acetaminophen (TYLENOL) tablet 650 mg    aluminum & magnesium hydroxide w/ simethicone (MAALOX ADVANCED REGULAR) suspension 30 mL    magnesium hydroxide (MILK OF MAGNESIA) 400 MG/5ML suspension 30 mL    cetirizine (ZyrTEC) tablet 10 mg    cholecalciferol (VITAMIN D) capsule 1,000 Units    clidinium-chlordiazePOXIDE (LIBRAX) 5-2.5 MG per capsule 1 capsule    cyanocobalamin (Vitamin B-12) tablet 500 mcg    DULoxetine (CYMBALTA) DR capsule 60 mg    fluticasone (FLONASE) 50 MCG/ACT nasal spray 1 spray    gabapentin (NEURONTIN) capsule 600 mg    glimepiride (AMARYL) tablet 4 mg    naproxen sodium (ANAPROX) tablet 550 mg    omeprazole (PriLOSEC) capsule 20 mg    ondansetron (ZOFRAN) tablet 8 mg    ranolazine (RANEXA) 12 hr tablet 500 mg    SUMAtriptan (IMITREX) tablet 50 mg    topiramate (TOPAMAX) tablet 50 mg    terbinafine (LamISIL) tablet 250 mg    PCP: [name]  [phone]    Has patient had appointment with Primary Care  Physician in the last year? Yes   Does patient have a current physical concern requiring an appointment with a medical provider? No   Barriers to accessing Medical Care: Finances  Knowledge deficit about medical problems / treatment needs: Interested in learning about coping strategies and medication management.     Active Problems:   Previous Surgeries / Illnesses: Yes Type:   Barriers to accessing Medical Care: Finances (not being able to work)  Knowledge deficit about medical problems / treatment needs: Needs further education on illness and treatment  Highest Adult weight: 300  Lowest Adult weight: 162    NUTRITIONAL SCREEN:   --Have you had unintentional weight loss recently? No   --Have you had unintentional weight gain recently? No   --Do you take any medications with which there are potential food-drug interactions? No   --DO you have a recently diagnosed medical condition resulting in dietary changes? Diverticulitis   --Do you have any questions about your diet? No   --Do you have trouble chewing? No   --Would you like to meet with a dietician to discuss your diet? No

## 2013-06-08 NOTE — Progress Notes (Signed)
Psych Social Work Progress Note  Chart reviewed and Discussed patient with treatment team    Contact with: Patient, Engineer, drilling, Family/Spouse/Partner/Informal support and Inpatient team/staff (MD,NP, Nurse, Psych Tech)    Contact type: Rounding, Treatment Planning and [facility] secure E-Mail    Purpose of Contact: Continue assessment    Psychosocial Risk Factors Risk Factors: Yes, Lacks Income or payment mechanism: Yes, Lacks income or payment mechanism comments: SSDI application pending, Acute financial crisis  : Yes, Acute financial crisis/stress comments: living off of spouse's SSDI, Lack social supports : Yes, Lacks social supports comments: feels unsupported from family, Programmer, systems: Yes, Lacks transportation comments: unable to drive due to medical condition, Risk of Violence to Self: Yes, Risk of violence to self comments: history of OD    Narrative: Patient went to [facility] today and said it went well.  She will return tomorrow, and discharge home with spouse around 4 pm.  Patient says she will need writer to arrange [employer] to [facility].  Writer met with patient's wife, [name], and her father to discuss discharge plans.  They are in support of this plan.  They asked if we could provide patient with a large pill box for discharge. Wife will pick up patient tomorrow at 4pm.       Next Steps: fill medications.    Wynona Luna, MSW  Pager ID [phone]

## 2013-06-09 ENCOUNTER — Ambulatory Visit: Payer: Self-pay

## 2013-06-09 MED ORDER — NAPROXEN SODIUM 550 MG PO TABS *A*
550.0000 mg | ORAL_TABLET | Freq: Two times a day (BID) | ORAL | Status: DC
Start: 2013-06-09 — End: 2013-07-08

## 2013-06-09 MED ORDER — TERBINAFINE HCL 250 MG PO TABS *I*
250.0000 mg | ORAL_TABLET | Freq: Every day | ORAL | Status: DC
Start: 2013-06-09 — End: 2013-07-08

## 2013-06-09 MED ORDER — BUPROPION HCL 200 MG PO TB12 *I*
200.0000 mg | ORAL_TABLET | Freq: Two times a day (BID) | ORAL | Status: DC
Start: 2013-06-09 — End: 2013-06-22

## 2013-06-09 MED ORDER — CHLORDIAZEPOXIDE-CLIDINIUM 5-2.5 MG PO CAPS *I*
1.0000 | ORAL_CAPSULE | Freq: Four times a day (QID) | ORAL | Status: DC
Start: 2013-06-09 — End: 2013-08-18

## 2013-06-09 MED ORDER — MELATONIN 3 MG PO TABS *I*
6.0000 mg | ORAL_TABLET | Freq: Every evening | ORAL | Status: DC | PRN
Start: 2013-06-09 — End: 2013-07-08

## 2013-06-09 MED ORDER — HYDROXYZINE HCL 25 MG PO TABS *I*
50.0000 mg | ORAL_TABLET | Freq: Three times a day (TID) | ORAL | Status: DC | PRN
Start: 2013-06-09 — End: 2013-06-18

## 2013-06-09 NOTE — Progress Notes (Signed)
RN Assessment: See writer's note form 06/08/13 for [facility] for admission.

## 2013-06-09 NOTE — Plan of Care (Signed)
Care Plan Goals      Risk for Suicide       Patient Will Be Safe And Free From Injury Throughout Hospitalization Maintaining       Patient Will Express Sense Of Hope And Future Orientation By Discharge Maintaining          Risk for Withdrawal       Patient Will Establish Adequate Nutrition, Hydration, And Elimination, With Nursing Assistance, By Day 2 Progressing towards goal          Risk for Alteration of Physiological Status       Patient Will Report, Throughout Hospitalization, Relief Of Pain/Discomfort When Utilizing Comfort Measures Maintaining          Pt. Less anxious about PHP today. Requested Atarax before leaving. Left for PHP at 0915. Arrived back on unit 0330. Anxious requested PRN atarax. Belongings packed. Given large pill box. Paperwork signed at 1600. Given script for Naproxen and Librax. Verbalized understanding of outpatient appts. Clothing packed. Left unit at 100 with wife transporting her home.     Nyra Market, RN

## 2013-06-09 NOTE — Comprehensive Assessment (Addendum)
06/01/13 0000   Discharge Planning   Reason for Hospitalization Danger to Self   Follow Up Arrangements Made See discharge instructions   Discharge Continuing Care Plan Transmitted to [facility] Provider on: 06/09/13   Discharge Information Faxed To: Mental Health Provider   Referrals Made Mental health;PHP   Interventions Collateral contacted;Insurance authorization/approval;Family/significant other support     Patient is discharging home today with her wife.  She says her depression is a 5/10, which is an improvement from when she was admitted, which she stated was a 10/10.  She is looking forward to returning home, and starting PHP as an outpatient on Thursday.  Writer has faxed [facility] doctor's rationale for PHP.  Writer has arranged transportation to [facility] with [employer] from 2/5 - 2/13.  Her medications were filled at outpatient pharmacy.  She was provided with a pill box for medications.     Wynona Luna, MSW  Pager ID [phone]
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
## 2013-06-09 NOTE — Discharge Instructions (Signed)
Discharge Date:   06/09/13  Discharge Time:   4 PM    Psychiatrist:   Dr. Lissa Hoard  Resident/NP:   Dr. Sandria Senter  Primary Care Physician:   [name], MD  Social Worker:   Wynona Luna LMSW  Other:    Additional Patient Information:  You are discharging today with your wife, Kaylee Harris, and will return to your home at [address] [place] [address]; [phone].      A referral to Adult Partial Hospitalization was made.  You attended two days of PHP while in the hospital, and will start as an outpatient on Thursday, February 5 @ 9:30am.  Groups are Monday - Friday 9:30am to 3:30pm.      Transportation has been arranged to [facility] starting Thursday, February 5 to Friday, February 13.  You will be picked up by [employer] at 8:30am, and again at 3:30pm to return home.  If you need additional days for transportation, you can call [facility] at [phone].    When you complete PHP, you will resume outpatient follow up at [facility] with Tresa Res.       Your medications were filled at outpatient pharmacy.      Diagnosis at Discharge:    Axis I:  Major depressive episode      Brief Summary of Your Hospital Course (including key procedures and diagnostic test results):  You were admitted to the hospital due to suicidal thoughts and plan to overdose on her medications. You had numerous symptoms of depression. You reported having a good response to Wellbutrin when it was first started a couple of years ago, so this was maximized to 200mg  twice daily. Hydroxyzine was added for anxiety, which you can continue to take at a dose of 50mg  three times daily as needed. You were having trouble sleeping, so Melatonin 6mg  nightly as needed was added as well. After a few days on the unit you denied further suicidal thoughts and your mood began to improve. You attended partial hospitalization on 2/3 and 2/4, which went well. You were ready for discharge on 06/09/13.     When to call for  help:    Call your psychiatric outpatient provider if experiencing any of these symptoms: increased irritability, sleep changes, appetite changes, energy changes, thoughts to harm yourself or others, anxiety, fear, auditory or visual hallucinations.  [employer] and Mobile Crisis team: (24 hours/7 days) [phone] (Out of [place]) [phone]     Recommendations   Continue Wellbutrin 200mg  twice daily   Hydroxyzine 50mg  three times daily as needed for anxiety   Melatonin 6mg  as needed for insomnia    The following personal items were collected during your admission and were returned to you:    Belongings  Dentures: None  Vision - Corrective Lenses: Glasses  Hearing Aid/Cochlear Implant: None  Jewelry:  (Pt. has a gold colored ring on her finger.)  Clothing:  (Pt. has a shirt and pants on.)  Other Valuables: Cell phone;Purse  Prosthesis / Assistive Devices: None  Valuables Given To:  (Pt. gave coat to parents to take home.)    Handouts explaining medicine use, precautions, and safety tips discussed and given to: you    Pharmacy where prescriptions will be filled: Filled at [facility] and provided to you at discharge.     Level of Outreach Indicated If Patient Fails to Attend Scheduled Mental Health Appointment: routine program follow up    Supportive Referrals: [facility]

## 2013-06-10 ENCOUNTER — Ambulatory Visit: Payer: Self-pay
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
## 2013-06-11 ENCOUNTER — Ambulatory Visit: Payer: Self-pay

## 2013-06-11 MED ORDER — LORAZEPAM 0.5 MG PO TABS *I*
0.5000 mg | ORAL_TABLET | Freq: Three times a day (TID) | ORAL | Status: DC | PRN
Start: 2013-06-11 — End: 2013-06-14

## 2013-06-11 MED ORDER — DULOXETINE HCL 30 MG PO CPEP *I*
30.0000 mg | DELAYED_RELEASE_CAPSULE | Freq: Every day | ORAL | Status: DC
Start: 2013-06-11 — End: 2013-06-14

## 2013-06-11 MED ORDER — LORAZEPAM 0.5 MG PO TABS *I*
0.5000 mg | ORAL_TABLET | Freq: Three times a day (TID) | ORAL | Status: DC | PRN
Start: 2013-06-11 — End: 2013-06-11

## 2013-06-11 NOTE — Progress Notes (Addendum)
NP Progress Note    Communicated with pt's PCP Dr. Laurance Flatten regarding suggestion to increase Cymbalta and add lorazepam PRN temporarily, received the following reply:    Hi Kaylee Harris, Thanks so much for the communication. Yes, short term lorazepam I think would be fine (we should also check with her cardiologist Dr. Kerin Ransom at [facility] given her recurrent syncope). And yes, I absolutely support the increase in the Cymbalta. Thanks, Hulan Amato    Pt stopped by writer's office at the end of the program day to inquire about whether or not the medication changes could be made. Informed pt that I was still waiting on a call back from Dr. Kerin Ransom but could call her at home to notify her if I heard from him by the end of the day. Pt reports she is upset because she has a birthday party coming up not this weekend, but the next weekend, that her father in law will be at, and she is anxious about how to handle this. Suggested making a list of pros and cons about going or not going, and that if she goes to the party perhaps to have a backup plan to leave early if needed. Pt reports tomorrow she has a family function to attend and all of her family will be there, she reports she posted about her hospitalization on Facebook and knows she will get a lot of questions about it, but wants to take the opportunity to share her struggle with depression with her family.     Writer then spoke to pt's cardiologist Dr. Kerin Ransom who explained that he sees no reason that would indicate these medication changes would be problematic, notes she has had a normal QT interval, and feels the relief of her anxiety may actually help to prevent her syncopal episodes. He explains that pt has vasovagal syncope which can be associated with sudden drops in heart rate and blood pressure, but does not think either of these medications would exacerbate the likelihood of that happening. He felt that writer should dose base on pt's psychiatric need at this point.    Writer  called pt at home. Explained that her providers had approved of the suggested medication changes. Explained that I would call in an "emergency" supply of lorazepam to her pharmacy, instructed her to take 0.5mg  TID PRN (will plan to reduce dose by 0.5mg  weekly to taper) and to increase Cymbalta to 90mg  daily.     Called in prescription [facility] for 3 day supply of lorazepam 0.5mg  TID PRN, #9 tablets, and also sent in a 5-day prescription for Cymbalta 30mg  (will plan to increase to 120mg  daily next week).
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
## 2013-06-14 ENCOUNTER — Ambulatory Visit: Payer: Self-pay

## 2013-06-14 MED ORDER — LORAZEPAM 0.5 MG PO TABS *I*
0.5000 mg | ORAL_TABLET | Freq: Two times a day (BID) | ORAL | Status: DC | PRN
Start: 2013-06-14 — End: 2013-06-18

## 2013-06-14 MED ORDER — DULOXETINE HCL 30 MG PO CPEP *I*
30.0000 mg | DELAYED_RELEASE_CAPSULE | Freq: Every day | ORAL | Status: DC
Start: 2013-06-14 — End: 2013-06-18

## 2013-06-14 NOTE — Progress Notes (Signed)
NP progress Note    Met with pt briefly to provide her with a refill of lorazepam. Pt reports she has only used 4 of the tablets writer provided for her so far and has 5 left. Has been taking it no more than 1-2 times per day. Will refill the medication as lorazepam 0.38m BID PRN. #14 tablets provided. Pt reports she is scared of relying on the lorazepam too much, discussed using the PRN if the anxiety is "higher than a 5 out of 10" (on 0-10 scale, 10= worst), if lower than that, will try to use coping skills. Pt tolerating the increase in Cymbalta so far, will provide #30 Cymbalta 356mcapsules to add to the 6031mapsule to continue total daily dose of 26m39mily. Pt reports her wife will drive her to the pharmacy to fill the prescriptions when she runs out and will continue to secure her medications. Pt reports she is feeling anxious about the birthday party she has approaching this weekend, writer encouraged her to continue problem-solving and safety planning for this. Pt mentioned briefly to writProbation officert she [facility] too much on her father in law with her current therapist. WritKonrad Doloresl notify the treatment team but warned her we may not be able to change that for her.

## 2013-06-14 NOTE — Progress Notes (Signed)
[facility]  Primary Therapist Progress Note     Date of service: 06/14/2013  Length of session: 60 min    Contact Type:  Individual Psychotherapy    Comprehensive Treatment Plan Goals:  Goals/initial treatment plan developed during session with pt today.    Engagement in Program  Compliance with medication regimen: Good   Participation in Groups: Good   Substance ABUSE: N/A    Mental Status Exam  APPEARANCE: Well-groomed, Casual  ATTITUDE TOWARD INTERVIEWER: Cooperative  MOTOR ACTIVITY: WNL (within normal limits)  EYE CONTACT: good   SPEECH: Normal rate and tone  AFFECT: Full Range  MOOD: Anxious and Depressed  THOUGHT PROCESS: Normal  THOUGHT CONTENT: Negative Rumination and Obsessions  PERCEPTION: Within normal limits  CURRENT SUICIDAL IDEATION: patient denies  CURRENT HOMICIDAL IDEATION: Patient denies  ORIENTATION: Alert and Oriented X 3.  CONCENTRATION: WNL  MEMORY:   Recent: intact   Remote: intact  JUDGEMENT: Impaired -  mild  IMPULSE CONTROL: Intact  INSIGHT: Fair    Risk Assessment  SELF-INJURY:Patient denies  SUICIDAL IDEATION: patient denies  HOMICIDAL IDEATION: Patient denies  AGGRESSIVE BEHAVIOR: Patient denies    Suicide risk assessed and updated   No changes from baseline Preadmission Lethality Assessment     Violence risk assessed and updated   Violence risk was assessed and No Change noted from baseline formulation of risk and/or previous assessment.    Session Content  Writer introduced self and role to pt. Met with pt to discuss goals for PHP treatment and complete initial treatment plan.     Interventions/Plan  Psychotherapy continues as described in care plan; plan remains the same., Other planned interventions or recommendations  Reading materials provided and Left message for outpatient primary therapist.  Pt has been informed of the weekly friends and family group available while in [place]. yes  Discussed with pt the importance of family  involvement and support in treatment. Pt is willing to have writer contact her spouse.

## 2013-06-14 NOTE — Progress Notes (Signed)
[facility]  Clinical Management Note   06/14/2013  Writer contacted outpt therapist introducing role, providing contact information and requesting a follow up appointment after discharge from program.

## 2013-06-15 ENCOUNTER — Ambulatory Visit: Payer: Self-pay

## 2013-06-16 ENCOUNTER — Ambulatory Visit: Payer: Self-pay

## 2013-06-16 NOTE — Progress Notes (Signed)
[facility]  Primary Therapist Progress Note     Date of Service: 06/16/2013  Length of session: 45 min    Contact Type:  Individual Psychotherapy    Comprehensive Treatment Plan Goals:  Goal # 1: To decrease acute symptoms of depression, anxiety, and suicidal ideation.     As evidenced by pt report and daily program attendance and active participation.  Objective Attained: partial  Progress to Date:   Lavaughn Bisig will report her depression as 6 or less out of 10 with 10 equal to most severe symptoms for 3 out of 5 consecutive days.Pt rated depression 4/10.  Aneita Kiger will report a decrease in the intensity/frequency of suicidal thoughts and be free of suicidal thoughts for 3 out of 5 consecutive days. Pt denied SI, plan or intent.  Mayu Ronk will not attempt suicide while in [facility] and will engage in safety planning. Pt did not attempt suicide during program admission and engaged in safety planning.  Ova Gillentine will report her anxiety as 6 or less out of 10 with 10 equal to most severe symptoms for 3 out of 5 consecutive days. Pt rated anxiety 6/10.  Piper Hassebrock will experience no more than 1 panic attacks within 5 consecutive days with decreased intensity of episodes.  Pt reported one mild panic attack during discussion of her nephew's birthday party this weekend.     Engagement in Program  Compliance with medication regimen: Good   Participation in Groups: Good   Substance ABUSE: N/A    Mental Status Exam  APPEARANCE: Well-groomed, Casual  ATTITUDE TOWARD INTERVIEWER: Cooperative  MOTOR ACTIVITY: WNL (within normal limits)  EYE CONTACT: fair   SPEECH: Normal rate and tone  AFFECT: Full Range  MOOD: Anxious and Depressed  THOUGHT PROCESS: Normal  THOUGHT CONTENT: Negative Rumination  PERCEPTION: Within normal limits  CURRENT SUICIDAL IDEATION: patient denies  CURRENT HOMICIDAL IDEATION: Patient denies  ORIENTATION: Alert and Oriented X 3.  CONCENTRATION:  WNL  MEMORY: intact  JUDGEMENT: Impaired -  mild  IMPULSE CONTROL: Fair  INSIGHT: Fair    Risk Assessment  SELF-INJURY:Patient denies  SUICIDAL IDEATION: patient denies  HOMICIDAL IDEATION: Patient denies  AGGRESSIVE BEHAVIOR: Patient denies    Suicide risk assessed and updated   No changes from baseline Preadmission Lethality Assessment     Violence risk assessed and updated   Violence risk was assessed and No Change noted from baseline formulation of risk and/or previous assessment.    Session Content  Writer met with pt to review treatment plan goals and to address recent admission of a pt she went to HS with Pt stated at first it felt like an intrusion into her private life and that her participation in program may be known to other acquaintances they had in common. Pt reported that another pt was causing her more anxiety than the pt she went to HS with but was discharged yesterday much to her relief. Pt did not have those concerns any longer since the other pt was keeping their distance, was in another group. Pt recalled that they were both quiet and unpopular in school which reduced her fears that pt would stir up trouble for her, that they were more alike in wanting to preserve their privacy. Pt reported that they happen to take the same cab home together but that this was not an issue. Stated they laughed about a few memories from the past which served as a welcome distraction from the  long cab ride home. Writer asked if pt had any desire to meet with the other pt to address any other concerns but pt denied this as being necessary. Since they were in separate groups and typically sat with peers from their group there was little concern that they would engage during program hours. Pt reported she worked out the issue pertaining to a family gathering this weekend where she would be in the company of her father-in-law. Pt was able to get significant support and helpful suggestions during group that she brought  home to [place]. They came to the same conclusion about what to do with the situation. Both came to a healthy decision which was mutually satisfying and would embrace a healthy self-worth for pt without triggering her issues with past trauma. Pt continues to make gains in program.    Interventions/Plan  Safety Plan reviewed with patient. and Psychotherapy continues as described in care plan; plan remains the same.

## 2013-06-17 ENCOUNTER — Ambulatory Visit: Payer: Self-pay

## 2013-06-17 ENCOUNTER — Other Ambulatory Visit: Payer: Self-pay | Admitting: Primary Care
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
## 2013-06-17 NOTE — Progress Notes (Signed)
NP Progress Note    Called pt at home after receiving message from the receptionist that pt had requested writer to call her regarding her anxiety medication. Pt reports that the lorazepam doesn't seem to be effective. She wondered if it could be increased to 1mg  which she had taken in the hospital. She reports she didn't ask writer about this during the program day because she knew Probation officer was "busyChief Technology Officer explained that I was planning to meet with her tomorrow and that we can discuss the lorazepam at that time, but explained I do not necessarily want to increase the total daily dose beyond 1.5mg  as I do not think it would be wise to get into the habit of increasing the lorazepam (original goal had actually been to gradually taper the lorazepam). Writer had heard from pt's [facility] therapist that the subject of Alzheimer's was brought up in group which dysregulated pt earlier today. Writer acknowledged this and explained that increasing the medication wouldn't necessarily stop things like that from happening and that it will be important for her to practice coping skills to manage her response to such triggers. Suggested pt work on relaxation and Data processing manager. Will plan to follow up with pt tomorrow morning.

## 2013-06-18 ENCOUNTER — Ambulatory Visit: Payer: Self-pay

## 2013-06-18 MED ORDER — LORAZEPAM 1 MG PO TABS *I*
1.0000 mg | ORAL_TABLET | Freq: Two times a day (BID) | ORAL | Status: DC | PRN
Start: 2013-06-18 — End: 2013-06-22

## 2013-06-18 NOTE — BH Discharge Summary (Signed)
[facility]  Discharge Instructions     R PSY DATE ADMITTED 06/09/2013   DATE ADMITTED TO CURRENT [place] PROGRAM 06/09/2013      R PSY DC DATE 06/17/2013   DISCHARGE DATE 06/22/2013     Current Outpatient Prescriptions   Medication Sig    busPIRone (BUSPAR) 5 MG tablet Take 1 tablet (5 mg total) by mouth 3 times daily    LORazepam (ATIVAN) 1 MG tablet Take 1 tablet (1 mg total) by mouth 2 times daily as needed for Anxiety   Max daily dose: 2 mg    DULoxetine (CYMBALTA) 60 MG capsule Take 60 mg by mouth 2 times daily    buPROPion (WELLBUTRIN SR) 200 MG 12 hr tablet Take 1 tablet (200 mg total) by mouth 2 times daily   Swallow whole. Do not crush, break, or chew.    melatonin 3 MG Take 2 tablets (6 mg total) by mouth nightly as needed (insomnia)    terbinafine (LAMISIL) 250 MG tablet Take 1 tablet (250 mg total) by mouth daily    clidinium-chlordiazePOXIDE (LIBRAX) 5-2.5 MG per capsule Take 1 capsule by mouth 4 times daily (before meals and nightly)    naproxen sodium (ANAPROX) 550 MG tablet Take 1 tablet (550 mg total) by mouth 2 times daily (with meals)    cetirizine (ZYRTEC) 10 MG tablet Take 1 tablet (10 mg total) by mouth daily    glimepiride (AMARYL) 4 MG tablet Take 1 tablet (4 mg total) by mouth daily (with breakfast)    lancets Brand Free Style Lite; Use 2 times per day as directed for blood glucose testing.    FREESTYLE LITE test strip Use BID as directed for 250.02    Alcohol Swabs (ALCOHOL WIPES) PADS Use BID for BG check    topiramate (TOPAMAX) 50 MG tablet Take 1 tablet (50 mg total) by mouth 2 times daily    blood glucose monitor system Brand: cheapest brand available per her insurance.  Use as directed.    VENTOLIN HFA 108 (90 BASE) MCG/ACT inhaler Inhale 1-2 puffs into the lungs every 4-6 hours as needed for Wheezing    fluticasone (FLONASE) 50 MCG/ACT nasal spray 1 spray by Nasal route daily    gabapentin (NEURONTIN) 600 MG tablet Take 1  tablet (600 mg total) by mouth 3 times daily    atorvastatin (LIPITOR) 40 MG tablet Take 40 mg by mouth daily (with dinner)    ranolazine (RANEXA) 500 MG 12 hr tablet Take 500 mg by mouth 2 times daily   Swallow whole. Do not crush, break, or chew.    theophylline (THEODUR) 200 MG 12 hr tablet Take 200 mg by mouth 2 times daily   Do not crush or chew. May be divided.    Bioflavonoid Products (ESTER C PO) Take 500 mg by mouth daily    cholecalciferol (VITAMIN D) 1000 UNIT capsule Take 1,000 Units by mouth daily    cyanocobalamin 500 MCG tablet Take 500 mcg by mouth daily    omeprazole (PRILOSEC) 20 MG capsule Take 1 capsule (20 mg total) by mouth daily (before breakfast)    famciclovir (FAMVIR) 500 MG tablet Take 1 tablet (500 mg total) by mouth 3 times daily as needed    Non-System Medication The above patient is followed in our clinic and cannot resume work permanently.    ondansetron (ZOFRAN) 8 MG tablet Take 1 tablet (8 mg total) by mouth 3 times daily as needed for Nausea  SUMAtriptan (IMITREX) 50 MG tablet Take 1 tablet (50 mg total) by mouth as needed for Migraine   Take at onset of headache. May repeat once in 2 hours.    SUMAtriptan kit (IMITREX STATDOSE) 6 MG/0.5ML injection Inject 0.5 mLs (6 mg total) into the skin once as needed for Migraine   May repeat once after 1 hour if needed.     No current facility-administered medications for this visit.     We have provided enough medication to last you until your next psychiatric assessment. Refills or changes in medication must be done by your outpatient provider. It is VITAL that you keep your scheduled appointments.     Patient Instructions  FOLLOW-UP PROVIDER APPOINTMENTS:  R PSY F/U TYPE 06/09/2013   APPT TYPE Therapist      R PSY F/U NAME 06/09/2013   APPT NAME Tresa Res   Comments [facility]     Date Entered 06/15/2013   APPT DATE 06/29/2013     Date Entered 06/15/2013   APPT TIME 10:00 AM     R PSY F/U PHONE 06/15/2013   APPT PHONE # [phone]     R  PSY F/U FAX 06/11/2013   APPT FAX # in E-record     R PSY F/U ADDRESS 06/11/2013   APPT ADDRESS [place]     Primary Care Physician: [name], MD  Phone: [phone]  Fax: [phone]  Address:  [address]  [place] [address]  See your doctor as needed for your medical care.    REASON FOR DISCHARGE: No longer requires this level of care    GENERAL INSTRUCTIONS: [facility] Helpline/Mobile Crisis Team: [phone] (24 hours/7 days); TTY [phone]; Out of [place]. Recommendations/Diagnosis/Additional Instructions: N/A    The above information has been discussed with me and I have received a copy.  I understand that I am advised to follow the instructions given to me to appropriately care for my condition.     _____________________________________________________________  Patient Signature:     _____________________________________________________________  Date:
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
## 2013-06-18 NOTE — Patient Instructions (Signed)
Increase Cymbalta to 60mg  twice daily    Increase lorazepam to 1mg  twice daily as needed for now (we will plan to taper back next week)
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
## 2013-06-21 ENCOUNTER — Telehealth: Payer: Self-pay | Admitting: Primary Care

## 2013-06-21 ENCOUNTER — Ambulatory Visit: Payer: Self-pay

## 2013-06-21 MED ORDER — BUSPIRONE HCL 5 MG PO TABS *I*
5.0000 mg | ORAL_TABLET | Freq: Three times a day (TID) | ORAL | Status: DC
Start: 2013-06-21 — End: 2013-06-22

## 2013-06-21 NOTE — Telephone Encounter (Signed)
Received call from Mertie Moores, psych NP from partial hospitalization program. NP concerned about worsening in this patient and describes [place] having dissociative symptoms and passive suicidal thoughts, with worsening racing thoughts. Offered Buspar today for acute anxiety and PTSD symptoms, due to concern about antipsychotics and neurocardio history. Does not want to continue benzodiazepines long term, which Cardiologist Dr. Kerin Ransom agreed with. Plan is for discharge tomorrow from [facility], despite uncontrolled symptoms, and recommendation for community group programs. Pt has therapist f/u with Tresa Res on 2/24 through [facility], and eventually will see psychiatrist. She will provided 30d supply of medications. Pt is aware of hospitalization for acute worsening of suicidal thoughts, but voiced today with NP that she did not feel these were strong enough to act on. I have asked NP to send me d/c summary if she is d/c-ed tomorrow as planned, and we will offer f/u in clinic before March.    [name], MD  [facility]  06/21/2013  3:45 PM

## 2013-06-21 NOTE — Progress Notes (Signed)
[facility]  Clinical Management Note   06/21/2013     Pt came to the writer and explained that she continue to have racing thoughts. The writer encouraged the pt to use her coping skills as well as let the NP know by writing a note and putting it under her door. The pt denied SI.
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
## 2013-06-22 ENCOUNTER — Ambulatory Visit: Payer: Self-pay

## 2013-06-22 MED ORDER — LORAZEPAM 0.5 MG PO TABS *I*
0.5000 mg | ORAL_TABLET | Freq: Three times a day (TID) | ORAL | Status: DC | PRN
Start: 2013-06-22 — End: 2013-07-23

## 2013-06-22 MED ORDER — BUPROPION HCL 200 MG PO TB12 *I*
200.0000 mg | ORAL_TABLET | Freq: Two times a day (BID) | ORAL | Status: DC
Start: 2013-06-22 — End: 2013-07-28

## 2013-06-22 MED ORDER — OMEPRAZOLE 20 MG PO CPDR *I*
20.0000 mg | DELAYED_RELEASE_CAPSULE | Freq: Every day | ORAL | Status: DC
Start: 2013-06-22 — End: 2013-08-09

## 2013-06-22 MED ORDER — DULOXETINE HCL 60 MG PO CPEP *I*
60.0000 mg | DELAYED_RELEASE_CAPSULE | Freq: Two times a day (BID) | ORAL | Status: DC
Start: 2013-06-22 — End: 2013-07-27

## 2013-06-22 MED ORDER — BUSPIRONE HCL 5 MG PO TABS *I*
5.0000 mg | ORAL_TABLET | Freq: Three times a day (TID) | ORAL | Status: DC
Start: 2013-06-22 — End: 2013-07-27

## 2013-06-22 MED ORDER — FAMCICLOVIR 500 MG PO TABS *I*
500.0000 mg | ORAL_TABLET | Freq: Three times a day (TID) | ORAL | Status: DC | PRN
Start: 2013-06-22 — End: 2014-10-11

## 2013-06-22 NOTE — Progress Notes (Signed)
[facility]  Primary Therapist Progress Note     Date of Service: 06/22/2013  Length of session: 25 min    Contact Type:  Individual Psychotherapy    Comprehensive Treatment Plan Goals:  Goal # 1: To decrease acute symptoms of depression, anxiety, and suicidal ideation.   Objective Attained: yes   Progress to Date: Kaylee Harris reported her depression as 10 out of 10 with 10 equal to most severe symptoms for 4 weeks prior to admission. The pt reported that currently her depression is a 7 out of 10.  Kaylee Harris reported increased suicidal thought(s) several times per day for at least 4 weeks prior to admission. The pt reported that she has had a couple passive thoughts over the last several days but reported that the thoughts are less intense and less frequent.   Kaylee Harris reported her anxiety as 10 out of 10 with 10 equal to most severe symptoms for 4 weeks prior to admission. The pt reported that currently her anxiety is an 8 out of 10.  Kaylee Harris has experienced 3 panic attacks within 2 weeks prior to admission. The pt reported that her panic attackd are less frewuent and less intense.    Engagement in Program  Compliance with medication regimen: Good   Participation in Groups: Good   Substance ABUSE: no    Mental Status Exam  APPEARANCE: Casual  ATTITUDE TOWARD INTERVIEWER: Cooperative  MOTOR ACTIVITY: WNL (within normal limits)  EYE CONTACT: good   SPEECH: Normal rate and tone  AFFECT: Anxious  MOOD: Anxious  THOUGHT PROCESS: Intact  THOUGHT CONTENT: No unusual themes  PERCEPTION: Within normal limits  CURRENT SUICIDAL IDEATION: patient denies  CURRENT HOMICIDAL IDEATION: Patient denies  ORIENTATION: Alert and Oriented X 3.  CONCENTRATION: WNL  MEMORY: intact  JUDGEMENT: Intact  IMPULSE CONTROL: Fair  INSIGHT: Fair    Risk Assessment  SELF-INJURY:Patient denies  SUICIDAL IDEATION: patient denies  HOMICIDAL IDEATION: Patient denies  AGGRESSIVE BEHAVIOR:  Patient denies    Suicide risk assessed and updated   No changes from baseline Preadmission Lethality Assessment     Violence risk assessed and updated   Violence risk was assessed and No Change noted from baseline formulation of risk and/or previous assessment.    Session Content  The pt reported that her symptoms have improved, please see above progress to date section. Writer and pt reviewed the pts discharge instructions. The pt reported that she is anxious about her discharge from program. The writer encouraged the pt to see how things go and if she feels that she needs more support then individual therapy then the writer recommended that the pt speak with her therapist about doing a group at [facility]. The writer encouraged the pt to use the [employer] as well. Both the writer and the pt signed the pts discharge instructions and the pt received a copy of her discharge instructions. The writer reviewed emergency resources with the pt. The pt denied SI.      Interventions/Plan  Discharge from [facility] today.

## 2013-06-22 NOTE — Progress Notes (Signed)
NP Summary     Name: Kaylee Harris  MRN: [id number]   DOB: 06-Nov-1975    Patient Active Problem List   Diagnosis Code    DM (diabetes mellitus), type 2, uncontrolled 250.02    Migraine with aura 346.00    Asthma 493.90    Hyperlipidemia 272.4    Fibromyalgia 729.1    Neurocardiogenic syncope 780.2    Depression 311    IUD (intrauterine device) in place V45.51    Skin lesion of face 709.9    IBS (irritable bowel syndrome) 564.1       Allergies   Allergen Reactions    Morphine Anaphylaxis    Trazodone Anaphylaxis        Vital Signs  There were no vitals filed for this visit.    Current Medications  Current Outpatient Prescriptions   Medication Sig    omeprazole (PRILOSEC) 20 MG capsule Take 1 capsule (20 mg total) by mouth daily (before breakfast)    famciclovir (FAMVIR) 500 MG tablet Take 1 tablet (500 mg total) by mouth 3 times daily as needed    busPIRone (BUSPAR) 5 MG tablet Take 1 tablet (5 mg total) by mouth 3 times daily    LORazepam (ATIVAN) 1 MG tablet Take 1 tablet (1 mg total) by mouth 2 times daily as needed for Anxiety   Max daily dose: 2 mg    DULoxetine (CYMBALTA) 60 MG capsule Take 60 mg by mouth 2 times daily    buPROPion (WELLBUTRIN SR) 200 MG 12 hr tablet Take 1 tablet (200 mg total) by mouth 2 times daily   Swallow whole. Do not crush, break, or chew.    melatonin 3 MG Take 2 tablets (6 mg total) by mouth nightly as needed (insomnia)    terbinafine (LAMISIL) 250 MG tablet Take 1 tablet (250 mg total) by mouth daily    clidinium-chlordiazePOXIDE (LIBRAX) 5-2.5 MG per capsule Take 1 capsule by mouth 4 times daily (before meals and nightly)    naproxen sodium (ANAPROX) 550 MG tablet Take 1 tablet (550 mg total) by mouth 2 times daily (with meals)    cetirizine (ZYRTEC) 10 MG tablet Take 1 tablet (10 mg total) by mouth daily    glimepiride (AMARYL) 4 MG tablet Take 1 tablet (4 mg total) by mouth daily (with breakfast)    lancets Brand Free Style Lite; Use 2 times per day as directed  for blood glucose testing.    FREESTYLE LITE test strip Use BID as directed for 250.02    Alcohol Swabs (ALCOHOL WIPES) PADS Use BID for BG check    topiramate (TOPAMAX) 50 MG tablet Take 1 tablet (50 mg total) by mouth 2 times daily    blood glucose monitor system Brand: cheapest brand available per her insurance.  Use as directed.    VENTOLIN HFA 108 (90 BASE) MCG/ACT inhaler Inhale 1-2 puffs into the lungs every 4-6 hours as needed for Wheezing    fluticasone (FLONASE) 50 MCG/ACT nasal spray 1 spray by Nasal route daily    gabapentin (NEURONTIN) 600 MG tablet Take 1 tablet (600 mg total) by mouth 3 times daily    atorvastatin (LIPITOR) 40 MG tablet Take 40 mg by mouth daily (with dinner)    ranolazine (RANEXA) 500 MG 12 hr tablet Take 500 mg by mouth 2 times daily   Swallow whole. Do not crush, break, or chew.    theophylline (THEODUR) 200 MG 12 hr tablet Take 200 mg by mouth 2 times daily  Do not crush or chew. May be divided.    Bioflavonoid Products (ESTER C PO) Take 500 mg by mouth daily    cholecalciferol (VITAMIN D) 1000 UNIT capsule Take 1,000 Units by mouth daily    cyanocobalamin 500 MCG tablet Take 500 mcg by mouth daily    Non-System Medication The above patient is followed in our clinic and cannot resume work permanently.    ondansetron (ZOFRAN) 8 MG tablet Take 1 tablet (8 mg total) by mouth 3 times daily as needed for Nausea    SUMAtriptan (IMITREX) 50 MG tablet Take 1 tablet (50 mg total) by mouth as needed for Migraine   Take at onset of headache. May repeat once in 2 hours.    SUMAtriptan kit (IMITREX STATDOSE) 6 MG/0.5ML injection Inject 0.5 mLs (6 mg total) into the skin once as needed for Migraine   May repeat once after 1 hour if needed.     No current facility-administered medications for this visit.       No results found for this or any previous visit (from the past 72 hour(s)).      Assessment  Met with pt to assess needs for discharge. Pt reports she is tolerating the  buspirone well so far. She reports last night she had trouble sleeping but feels this may have been due to feeling nervous about her last day today. She reports she feels calmer than yesterday. Her affect appeared more relaxed, she was not tearful and even smiled a few times. She reports overall she feels ready for discharge today. She denies suicidal ideation at this time.     Writer provided a #90 tablet supply of buspirone 71m tablets. Also provided a prescription for lorazepam 0.573m#42 tablets. Instructed pt to taper to 0.3m93mID starting Saturday, then after one week decrease to 0.3mg24mD, then after one week decrease to 0.3mg 33mly, then discontinue lorazepam. Will continue current doses of Cymbalta and Wellbutrin. [facility] Length of Service: 25 minutes.

## 2013-06-22 NOTE — Patient Instructions (Signed)
Starting on 06/26/13, decrease lorazepam to 0.5mg  three times a day (total daily dose 1.5mg ), for 1 week  Starting on 07/03/13, decrease lorazepam to 0.5mg  twice a day (total daily dose 1mg ) for 1 week  Starting on 07/10/13, decrease lorazepam to 0.5mg  daily for 1 week, then discontinue

## 2013-06-25 NOTE — Telephone Encounter (Signed)
Left msg for pt to call back

## 2013-06-28 NOTE — Telephone Encounter (Signed)
Pt desires appt sooner that the one she has scheduled for late March.  Call transferred to office staff to schedule.

## 2013-06-29 ENCOUNTER — Ambulatory Visit: Payer: Self-pay
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
## 2013-06-29 NOTE — Progress Notes (Signed)
Ambulatory Medication Clinic Referral     06/29/2013    PATIENT NAME: Kaylee Harris   MRN: [id number]  DOB: 1976/04/22  CURRENT THERAPIST: Tresa Res    Referring Clinic: Adult Ambulatory: Clinic    Referring to: Either    Rational for Referral: Continue medications from [facility]    Target symptoms: anxiety, panic attacts, OCD, PTSD, depressed mood, mood fluctuations, low energy, low motivation and thought disorder    Is patient taking psychotropic drugs? Yes    Has patient recently been treated in an inpatient or partial hospitalization setting? Yes    Ambulatory Medication Clinic Guidelines reviewed with patient.

## 2013-06-30 NOTE — Telephone Encounter (Signed)
Medication was denied

## 2013-07-04 ENCOUNTER — Encounter: Payer: Self-pay | Admitting: Gastroenterology

## 2013-07-04 ENCOUNTER — Ambulatory Visit
Admit: 2013-07-04 | Discharge: 2013-08-03 | Disposition: A | Discharge status: Home / Self Care | Payer: Self-pay | Source: Ambulatory Visit | Attending: Psychiatry | Admitting: Psychiatry

## 2013-07-04 ENCOUNTER — Encounter: Payer: Self-pay | Admitting: Primary Care

## 2013-07-05 ENCOUNTER — Telehealth: Payer: Self-pay

## 2013-07-05 ENCOUNTER — Ambulatory Visit: Payer: Self-pay | Admitting: Family Medicine

## 2013-07-05 NOTE — Telephone Encounter (Signed)
Left msg for pt to call back to schedule f/u visit after ED for syncope.

## 2013-07-07 ENCOUNTER — Ambulatory Visit: Payer: Self-pay

## 2013-07-08 ENCOUNTER — Encounter: Payer: Self-pay | Admitting: Primary Care

## 2013-07-08 ENCOUNTER — Telehealth: Payer: Self-pay

## 2013-07-08 DIAGNOSIS — B351 Tinea unguium: Secondary | ICD-10-CM | POA: Insufficient documentation

## 2013-07-08 MED ORDER — TERBINAFINE HCL 250 MG PO TABS *I*
250.0000 mg | ORAL_TABLET | Freq: Every day | ORAL | Status: DC
Start: 2013-07-08 — End: 2013-08-04

## 2013-07-08 MED ORDER — NAPROXEN SODIUM 550 MG PO TABS *A*
550.0000 mg | ORAL_TABLET | Freq: Two times a day (BID) | ORAL | Status: DC
Start: 2013-07-08 — End: 2013-08-04

## 2013-07-08 MED ORDER — MELATONIN 3 MG PO TABS *I*
6.0000 mg | ORAL_TABLET | Freq: Every evening | ORAL | Status: DC | PRN
Start: 2013-07-08 — End: 2013-08-02

## 2013-07-08 NOTE — Telephone Encounter (Signed)
Clinical Management Note   Writer called patient and scheduled patient for 07/15/13 @2 :30p.m.

## 2013-07-12 ENCOUNTER — Ambulatory Visit: Payer: Self-pay | Admitting: Ophthalmology

## 2013-07-15 ENCOUNTER — Ambulatory Visit: Payer: Self-pay

## 2013-07-15 ENCOUNTER — Other Ambulatory Visit: Payer: Self-pay | Admitting: Psychiatry
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
## 2013-07-15 NOTE — Progress Notes (Signed)
[facility]  ADULT AMBULATORY GROUP THERAPY SERVICE REFERRAL     Identifying Data:  Patient: Kaylee Harris   Medical Record Number: [id number]  DOB: 23-Feb-1976  Address: [address]  [place] [address]     Patient Phone: [phone] (home) [phone] (work)    Insurance: No coverage found.        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 Yes  [facility]  Past PSYCHIATRIC HISTORY: Inpatient, Partial, Outpatient  Previous GROUP EXPERIENCE: Yes      Risk Factors:  Suicide or self-mutilation: Past  Violence: None  Alcohol Use: None  Drug Use: None    Goals for Group Treatment:  Goal #1: Learn and reinforce CBT skills to manage affect  Goal #2:     Adult Group Therapy Offerings (Please check preferential group and time):    []  Depression Management (Wed. 4:00-5:30)  []  Anxiety Management (Tues. 12:00-1:30)  []  Present Centered Group Therapy of Survivors of Trauma (Thurs. 4:30-6:00)   []  Focused Brief Group (Thurs. 12:00-1:30)  []  Anger Management (Mon. 5:30-6:30)  []  Unified Protocol for Treatment of Emotional Disorders (Tues. 5:00-6:30)    [x]  Distress Tolerance Skills Training:   [x]  Mon. 5:30-7:00 []  Tues.  4:30-6:00 []  Wed. 1:00-2:30   []  Wed.5:30-7:00     []  Interpersonal Skills Group:   []  Mon. 5:30-7:00 []  Tues. 4:30-6:00 []  Wed. 1:00-2:30   []  Wed.5:30-7:00     []  Emotional Regulation Skills Training:   []  Mon. 5:30-7:00 []  Tues. 4:30-6:00 []  Wed. 1:00-2:30   []  Wed.5:30-7:00    []  Dialectical Behavioral Therapy (DBT) Program (must have intensively trained DBT individual therapist)    Comments:  Is the patient receptive to this referral? Yes  Will you be involved in the patient's continued care? Yes  Does patient have an individual therapist? Yes    Name of Therapist: Tresa Res, LMSW    Is he/she in support of this referral? Yes      Referral Processing Information:  The above information must be completed in full for referral to be processed.  Please send completed form to Luther Bradley, Psychiatry via [place].
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
## 2013-07-16 ENCOUNTER — Encounter: Payer: Self-pay | Admitting: Primary Care

## 2013-07-19 ENCOUNTER — Telehealth: Payer: Self-pay

## 2013-07-19 NOTE — Telephone Encounter (Signed)
A form for Medicaid transportation has been faxed in and needs to be filled out and faxed back, this form is in the Dr's office to be filled out

## 2013-07-20 NOTE — Telephone Encounter (Signed)
Form was completed and faxed back

## 2013-07-21 ENCOUNTER — Encounter: Payer: Self-pay | Admitting: Primary Care

## 2013-07-22 ENCOUNTER — Ambulatory Visit: Payer: Self-pay

## 2013-07-22 ENCOUNTER — Encounter: Payer: Self-pay | Admitting: Primary Care

## 2013-07-22 ENCOUNTER — Ambulatory Visit: Payer: Self-pay | Admitting: Primary Care

## 2013-07-22 ENCOUNTER — Telehealth: Payer: Self-pay

## 2013-07-22 VITALS — BP 134/94 | HR 89 | Temp 99.2°F | Ht 68.11 in | Wt 292.6 lb

## 2013-07-22 DIAGNOSIS — F32A Depression, unspecified: Secondary | ICD-10-CM

## 2013-07-22 DIAGNOSIS — F0781 Postconcussional syndrome: Secondary | ICD-10-CM

## 2013-07-22 DIAGNOSIS — R55 Syncope and collapse: Secondary | ICD-10-CM

## 2013-07-22 DIAGNOSIS — M797 Fibromyalgia: Secondary | ICD-10-CM

## 2013-07-22 MED ORDER — OXYCODONE-ACETAMINOPHEN 5-325 MG PO TABS *I*
1.0000 | ORAL_TABLET | Freq: Four times a day (QID) | ORAL | Status: DC | PRN
Start: 2013-07-22 — End: 2013-08-18

## 2013-07-22 NOTE — Progress Notes (Addendum)
[facility] MISSED/CANCELLED APPOINTMENT     Name: Kaylee Harris  MRN: [id number]   DOB: 1975/10/09    Date of Scheduled Service: 07/22/13    [name] cancelled today's appointment with less than 24 hours notice. Patient stated she had a conflicting doctor appointment.  I have left the patient a message to contact me.    Additional Information:    Not applicable

## 2013-07-22 NOTE — Progress Notes (Signed)
[facility]    SUBJECTIVE    Pt is here to discuss:    1. F/u syncopal episode -      17 yof with h/o neurocardiogenic syncope s/p extensive w/u and being followed by cardiologist Dr. Kerin Ransom, here for f/u recurrent syncope. Was at [facility] ED 3/17-3/18 after experiencing syncopal episode while home alone. Was first time she was left alone in a while; wife Derrika was going to store quickly. Pt was letting dog out and upon leaning up from seated position blacked out. When at ED c/o neck, R sided head, low back and R hip pain. ED notes reviewed: CXR, CT head and c spine, R hip xray all normal. EKG normal. Loop recorder  Interrogated and there were no arrhthymias at time of episode. D/c-ed home without any med changes. Pt here today requesting pain relief in all of the above locations, but most noticeably her neck. Was in cervical spine brace for a few hours.    Also c/o new black spots in vision that are different from her vision changes in the past. Had difficult time sleeping last night. +Nausea, denies vomiting (wondering if it was related to percocet she received in ED though). Also brings up these increasing jerking movements; wife [name] has witnessed these in the past and describes them like a shiver (pt did not initially notice them). They are now becoming more apparent. ED notes document that EP tech witnessed them while interrogating loop recorder, described them like a hiccup.         PMH / Family Hx / Social Hx  Patient's medications, allergies, problem list, past medical, social histories were reviewed and notable for:    H/o fibromyalgia and depression, s/p recent hospitalization and completion of partial hospitalization program. Seeing therapist and planning on seeing psychiatrist soon for medication review.      Current Outpatient Prescriptions   Medication Sig Note    SUMAtriptan (IMITREX) 50 MG tablet Take 50 mg by mouth as needed for Migraine   Take at onset of headache. May repeat once in 2  hours.     naproxen sodium (ANAPROX) 550 MG tablet Take 1 tablet (550 mg total) by mouth 2 times daily (with meals)     melatonin 3 MG Take 2 tablets (6 mg total) by mouth nightly as needed (insomnia)     terbinafine (LAMISIL) 250 MG tablet Take 1 tablet (250 mg total) by mouth daily     omeprazole (PRILOSEC) 20 MG capsule Take 1 capsule (20 mg total) by mouth daily (before breakfast)     famciclovir (FAMVIR) 500 MG tablet Take 1 tablet (500 mg total) by mouth 3 times daily as needed     busPIRone (BUSPAR) 5 MG tablet Take 1 tablet (5 mg total) by mouth 3 times daily     buPROPion (WELLBUTRIN SR) 200 MG 12 hr tablet Take 1 tablet (200 mg total) by mouth 2 times daily   Swallow whole. Do not crush, break, or chew.     DULoxetine (CYMBALTA) 60 MG capsule Take 1 capsule (60 mg total) by mouth 2 times daily     clidinium-chlordiazePOXIDE (LIBRAX) 5-2.5 MG per capsule Take 1 capsule by mouth 4 times daily (before meals and nightly)     cetirizine (ZYRTEC) 10 MG tablet Take 1 tablet (10 mg total) by mouth daily     glimepiride (AMARYL) 4 MG tablet Take 1 tablet (4 mg total) by mouth daily (with breakfast)     lancets Brand  Free Style Lite; Use 2 times per day as directed for blood glucose testing. 06/01/2013: 135 this am. 06-01-13    FREESTYLE LITE test strip Use BID as directed for 250.02     Alcohol Swabs (ALCOHOL WIPES) PADS Use BID for BG check     topiramate (TOPAMAX) 50 MG tablet Take 1 tablet (50 mg total) by mouth 2 times daily     blood glucose monitor system Brand: cheapest brand available per her insurance.  Use as directed.     VENTOLIN HFA 108 (90 BASE) MCG/ACT inhaler Inhale 1-2 puffs into the lungs every 4-6 hours as needed for Wheezing     fluticasone (FLONASE) 50 MCG/ACT nasal spray 1 spray by Nasal route daily     gabapentin (NEURONTIN) 600 MG tablet Take 1 tablet (600 mg total) by mouth 3 times daily     atorvastatin (LIPITOR) 40 MG tablet Take 40 mg by mouth daily (with dinner)      ranolazine (RANEXA) 500 MG 12 hr tablet Take 500 mg by mouth 2 times daily   Swallow whole. Do not crush, break, or chew.     theophylline (THEODUR) 200 MG 12 hr tablet Take 200 mg by mouth 2 times daily   Do not crush or chew. May be divided.     Bioflavonoid Products (ESTER C PO) Take 500 mg by mouth daily     cholecalciferol (VITAMIN D) 1000 UNIT capsule Take 1,000 Units by mouth daily     cyanocobalamin 500 MCG tablet Take 500 mcg by mouth daily     Non-System Medication The above patient is followed in our clinic and cannot resume work permanently.     ondansetron (ZOFRAN) 8 MG tablet Take 1 tablet (8 mg total) by mouth 3 times daily as needed for Nausea     SUMAtriptan (IMITREX) 50 MG tablet Take 1 tablet (50 mg total) by mouth as needed for Migraine   Take at onset of headache. May repeat once in 2 hours.     SUMAtriptan kit (IMITREX STATDOSE) 6 MG/0.5ML injection Inject 0.5 mLs (6 mg total) into the skin once as needed for Migraine   May repeat once after 1 hour if needed.        ROS  Current Symptoms      Severity  Headache   6  Pressure in Head  6  Neck Pain   6  Nausea/ Vomiting  4  Dizziness   5  Blurred vision   5  Balance Problems  6    Sensitivity to light  6  Sensitivity to noise  6  Feeling slowed down  6  Feeling mentally foggy 6  Don't feel right   6  Difficulty concentrating 6  Difficulty remembering 5  Feeling fatigued/easily tired 5  Confusion   4  Drowsiness   6  Difficulty falling asleep 6  Feeling more emotional 5  Feeling Irritable  6  Feeling sad   4  Feeling anxious/nervous 4    Total Symptom Score 119/132      OBJECTIVE  Filed Vitals:    07/22/13 1603   BP: 134/94   Pulse: 89   Temp: 37.3 C (99.2 F)   TempSrc: Temporal   Height: 1.73 m (5' 8.11")   Weight: 132.722 kg (292 lb 9.6 oz)   SpO2: 99%     Body mass index is 44.35 kg/(m^2).      General: well-appearing obese Caucasian female, irritable, winces when turning her head to  R to look at me  Eyes:. PERRLA. EOMI. Conjunctiva  pink, without swelling or exudate. Lids normal.   Psych: AAOx3, irritable affect and negative mood. Insight and judgement intact.           ASSESSMENT & PLAN  1. Postconcussive syndrome  2. Neurocardiogenic syncope  3. Fibromyalgia  4. Depression    Pt's preexisting comorbidities complicate her current symptoms and recovery, but given her c/o muscular pain, headaches, difficulty sleeping, etc. And head trauma, suspect that she may have postconcussive syndrome. Recommended intense cognitive and physical rest over the weekend to allow recovery. Recommended she continue melatonin for sleep, consider fish oil supplements. Given short course of percocet for pain and recommended gentle ROM exercises of her neck to help speed recovery, as I suspect her current pain is related to muscle strain.     Follow-up: as scheduled next week to review depression      [name], MD  [facility]  07/22/2013  4:13 PM

## 2013-07-22 NOTE — Telephone Encounter (Signed)
Pt had to cancel her 2p appt due to appt with her primary doctor. Can you call her to reschedule?

## 2013-07-23 ENCOUNTER — Encounter: Payer: Self-pay | Admitting: Nurse Practitioner

## 2013-07-26 ENCOUNTER — Ambulatory Visit: Payer: Self-pay | Admitting: Primary Care

## 2013-07-26 ENCOUNTER — Telehealth: Payer: Self-pay | Admitting: Neurology

## 2013-07-26 NOTE — Telephone Encounter (Signed)
Patient will call back to schedule GNE fuv per Dr. Thamas Jaegers. Seizures vs syncope

## 2013-07-26 NOTE — Telephone Encounter (Signed)
I talked with pt by phone to figure out who would be best to take care of her neurologic issues. There seems to have been a scheduling miscommunication. In 2011 as a resident I saw her for migraines. When she called to schedule another visit, instead of being scheduled with another neurology resident, she was accidentally scheduled in the [facility] (although she does not have MS, as seen by normal MRI brain and cervical cord.)   Given her current concern about seizure vs syncope, this would best be evaluated in general neurology resident clinic instead of [facility]. I let her know this. I also let her know that NP Kipp Brood will be retiring from the [facility] in the next few months. She wants to ask her PCP who she should see. I offered that our secretaries will call to schedule her in the general neurology clinic, but if she and her PCP want her to go elsewhere, she can turn down the offer of an appointment.      Pam, no need to call her back.    Scheduling ladies, could you schedule this patient in general neurology clinic please?

## 2013-07-26 NOTE — Telephone Encounter (Signed)
Tried calling home and mobile Youth worker) numbers.  No answer.  Left message on home phone for patient to call us with more information regarding myChart message.  Will also respond to myChart message from Friday.

## 2013-07-27 ENCOUNTER — Encounter: Payer: Self-pay | Admitting: Psychiatry

## 2013-07-27 ENCOUNTER — Ambulatory Visit: Payer: Self-pay | Admitting: Psychiatry

## 2013-07-27 ENCOUNTER — Other Ambulatory Visit: Payer: Self-pay | Admitting: Psychiatry

## 2013-07-27 VITALS — BP 139/78 | HR 92 | Resp 18 | Ht 68.11 in | Wt 294.0 lb

## 2013-07-27 DIAGNOSIS — F339 Major depressive disorder, recurrent, unspecified: Secondary | ICD-10-CM
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
## 2013-07-28 ENCOUNTER — Other Ambulatory Visit: Payer: Self-pay | Admitting: Psychiatry

## 2013-07-28 MED ORDER — BUPROPION HCL 300 MG PO TB24 *I*
300.0000 mg | ORAL_TABLET | Freq: Every morning | ORAL | Status: DC
Start: 2013-07-28 — End: 2013-08-18

## 2013-07-28 MED ORDER — DULOXETINE HCL 60 MG PO CPEP *I*
60.0000 mg | DELAYED_RELEASE_CAPSULE | Freq: Every day | ORAL | Status: DC
Start: 2013-07-28 — End: 2013-08-25

## 2013-07-28 MED ORDER — BUSPIRONE HCL 5 MG PO TABS *I*
5.0000 mg | ORAL_TABLET | Freq: Three times a day (TID) | ORAL | Status: DC
Start: 2013-07-27 — End: 2013-08-25

## 2013-07-29 ENCOUNTER — Ambulatory Visit: Payer: Self-pay | Admitting: Primary Care

## 2013-07-29 ENCOUNTER — Encounter: Payer: Self-pay | Admitting: Primary Care

## 2013-07-29 VITALS — BP 110/76 | HR 108 | Temp 97.2°F | Wt 294.0 lb

## 2013-07-29 DIAGNOSIS — D72829 Elevated white blood cell count, unspecified: Secondary | ICD-10-CM

## 2013-07-29 DIAGNOSIS — F339 Major depressive disorder, recurrent, unspecified: Secondary | ICD-10-CM

## 2013-07-29 DIAGNOSIS — E119 Type 2 diabetes mellitus without complications: Secondary | ICD-10-CM

## 2013-07-29 DIAGNOSIS — F0781 Postconcussional syndrome: Secondary | ICD-10-CM

## 2013-07-29 LAB — CBC
Hematocrit: 40 % (ref 34–45)
Hemoglobin: 12.7 g/dL (ref 11.2–15.7)
MCH: 29 pg/cell (ref 26–32)
MCHC: 32 g/dL (ref 32–36)
MCV: 92 fL (ref 79–95)
Platelets: 269 10*3/uL (ref 160–370)
RBC: 4.3 MIL/uL (ref 3.9–5.2)
RDW: 13.8 % (ref 11.7–14.4)
WBC: 8.9 10*3/uL (ref 4.0–10.0)

## 2013-07-29 NOTE — Progress Notes (Signed)
[facility]    SUBJECTIVE    Pt is here to discuss:    1. F/u postconcussive syndrome -2/2 syncope on 3/17. Was able to rest cognitively and physically over the weekend. Started more activities on Monday and noticed worsening of sx of headache and muscle aches. Neck pain has resolved, but still having mild hip and low back pain.  Using Percocet rarely; wife [name] has control over this med.     2. Depression - Seen by [name] Psych NP 2 days ago. Pt has been c/o jerking like movements in her arms/legs, which [name] thought could have been serotonin syndrome and/or SE of wellbutrin. Advised to cut back on wellbutrin (455m daily -> 3031mdaily) and cymbalta (12047m> 38m25mDiscussed use of abilify in future, which pt had previously thought was not provided due to cardiac risks/SE. Feeling slightly off and more sad with dose reductions.       3. DM2 - on glimepiride, did not tolerate metformin due to diarrhea. Last A1c in December of 7.2.      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 Do not crush, break, or chew.     ondansetron (ZOFRAN) 4 MG tablet  07/27/2013: Received from: External Pharmacy    oxyCODONE-acetaminophen (PERCOCET) 5-325 MG per tablet Take 1-2 tablets by mouth every 6 hours as needed for Pain   Max daily dose: 8 tablets     naproxen sodium (ANAPROX) 550 MG tablet Take 1 tablet  (550 mg total) by mouth 2 times daily (with meals)     melatonin 3 MG Take 2 tablets (6 mg total) by mouth nightly as needed (insomnia)     terbinafine (LAMISIL) 250 MG tablet Take 1 tablet (250 mg total) by mouth daily     omeprazole (PRILOSEC) 20 MG capsule Take 1 capsule (20 mg total) by mouth daily (before breakfast)     famciclovir (FAMVIR) 500 MG tablet Take 1 tablet (500 mg total) by mouth 3 times daily as needed     clidinium-chlordiazePOXIDE (LIBRAX) 5-2.5 MG per capsule Take 1 capsule by mouth 4 times daily (before meals and nightly)     cetirizine (ZYRTEC) 10 MG tablet Take 1 tablet (10 mg total) by mouth daily     glimepiride (AMARYL) 4 MG tablet Take 1 tablet (4 mg total) by mouth daily (with breakfast)     lancets Brand Free Style Lite; Use 2 times per day as directed for blood glucose testing. 06/01/2013: 135 this am. 06-01-13    FREESTYLE LITE test strip Use BID as directed for 250.02     Alcohol Swabs (ALCOHOL WIPES) PADS Use BID for BG check     topiramate (TOPAMAX) 50 MG tablet Take 1 tablet (50 mg total) by mouth 2 times daily  blood glucose monitor system Brand: cheapest brand available per her insurance.  Use as directed.     VENTOLIN HFA 108 (90 BASE) MCG/ACT inhaler Inhale 1-2 puffs into the lungs every 4-6 hours as needed for Wheezing     fluticasone (FLONASE) 50 MCG/ACT nasal spray 1 spray by Nasal route daily     gabapentin (NEURONTIN) 600 MG tablet Take 1 tablet (600 mg total) by mouth 3 times daily     atorvastatin (LIPITOR) 40 MG tablet Take 40 mg by mouth daily (with dinner)     ranolazine (RANEXA) 500 MG 12 hr tablet Take 500 mg by mouth 2 times daily   Swallow whole. Do not crush, break, or chew.     theophylline (THEODUR) 200 MG 12 hr tablet Take 200 mg by mouth 2 times daily   Do not crush or chew. May be divided.     Bioflavonoid Products (ESTER C PO) Take 500 mg by mouth daily     cholecalciferol (VITAMIN D) 1000 UNIT capsule Take 1,000 Units by mouth daily      cyanocobalamin 500 MCG tablet Take 500 mcg by mouth daily     Non-System Medication The above patient is followed in our clinic and cannot resume work permanently.     ondansetron (ZOFRAN) 8 MG tablet Take 1 tablet (8 mg total) by mouth 3 times daily as needed for Nausea     SUMAtriptan (IMITREX) 50 MG tablet Take 1 tablet (50 mg total) by mouth as needed for Migraine   Take at onset of headache. May repeat once in 2 hours.     SUMAtriptan kit (IMITREX STATDOSE) 6 MG/0.5ML injection Inject 0.5 mLs (6 mg total) into the skin once as needed for Migraine   May repeat once after 1 hour if needed.        ROS  Current Symptoms  Severity     3/19  Today  Headache   6  5    Pressure in Head  6  5  Neck Pain   6  3  Nausea/ Vomiting  4  5   Dizziness   5  4  Blurred vision   5  3  Balance Problems  6   3  Sensitivity to light  6  4  Sensitivity to noise  6  6  Feeling slowed down  6  4   Feeling mentally foggy 6  5   Don't feel right  6  5  Difficulty concentrating 6  4  Difficulty remembering 5  4  Feeling fatigued/easily tired 5  6  Confusion   4  4  Drowsiness   6  6  Difficulty falling asleep 6  4  Feeling more emotional 5  6  Feeling Irritable  6  5  Feeling sad   4  6  Feeling anxious/nervous 4  5    Total Symptom Score 119/132 102      OBJECTIVE  Filed Vitals:    07/29/13 1431   BP: 110/76   Pulse: 108   Temp: 36.2 C (97.2 F)   TempSrc: Temporal   Weight: 133.358 kg (294 lb)     Body mass index is 44.56 kg/(m^2).      General: well-appearing Caucasian female, pleasant & conversant, smiling more than last visit  Psych: AAOx3, affect fluctuates between irritable and tearful. Maintains gaze forward, not at me nor her wife in room      ASSESSMENT & PLAN  1. Postconcussive syndrome  Improving. Reviewed that she should add/reduce activity depending on how she tolerates it. Difficult to determine which of her sx are a result of fall and her coexisting co morbidities below, but she seems to have some improvement in  these sx which support this diagnosis.    2. Major depression, recurrent  Continue care with psych. Reviewed that she should have cardiologist consult/approve each medication in terms of its effect on her syncope. Reviewed that Abilify carries long term SE/risks of obesity, metabolic syndrome and QTc prolongation which we can monitor for.     3. Diabetes  Repeat studies below now that she is on SU.   - Hemoglobin A1c; Future  - Hemoglobin A1c  - Microalbumin, Urine, Random    4. Leukocytosis  - CBC; Future  - CBC          Follow-up: 3 months re: DM      [name], MD  [facility]  07/29/2013  2:42 PM        ______________________

## 2013-07-30 LAB — MICROALBUMIN, URINE, RANDOM
Creatinine,UR: 84 mg/dL (ref 20–300)
Microalbumin,UR: <0.3 mg/dL

## 2013-07-30 LAB — HEMOGLOBIN A1C: Hemoglobin A1C: 6.6 % — ABNORMAL HIGH (ref 4.0–6.0)

## 2013-08-02 ENCOUNTER — Ambulatory Visit: Payer: Self-pay | Admitting: Psychiatry

## 2013-08-02 ENCOUNTER — Telehealth: Payer: Self-pay

## 2013-08-02 ENCOUNTER — Ambulatory Visit: Payer: Self-pay | Admitting: Primary Care

## 2013-08-02 ENCOUNTER — Encounter: Payer: Self-pay | Admitting: Psychiatry

## 2013-08-02 VITALS — BP 147/82 | HR 87 | Wt 288.0 lb

## 2013-08-02 DIAGNOSIS — F339 Major depressive disorder, recurrent, unspecified: Secondary | ICD-10-CM

## 2013-08-02 MED ORDER — MELATONIN 5 MG PO CAPS
10.0000 mg | ORAL_CAPSULE | Freq: Every evening | ORAL | Status: DC
Start: 2013-08-02 — End: 2014-01-18

## 2013-08-02 MED ORDER — LURASIDONE HCL 20 MG PO TABS *I*
20.0000 mg | ORAL_TABLET | Freq: Every day | ORAL | Status: DC
Start: 2013-08-02 — End: 2013-08-18
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
## 2013-08-02 NOTE — Patient Instructions (Signed)
Take Fish Oil capsules, 1000 mg of EPA fatty acid.  Many preparations are 2 capsules per serving so you would have to take 6 capsules per day.

## 2013-08-02 NOTE — Telephone Encounter (Signed)
[place] fax[place] over a request of information along with a RR, this has been sent to [facility] [place] for processing

## 2013-08-04 ENCOUNTER — Ambulatory Visit
Admit: 2013-08-04 | Discharge: 2013-09-02 | Disposition: A | Discharge status: Home / Self Care | Payer: Self-pay | Source: Ambulatory Visit | Attending: Psychiatry | Admitting: Psychiatry

## 2013-08-04 ENCOUNTER — Other Ambulatory Visit: Payer: Self-pay | Admitting: Nurse Practitioner

## 2013-08-04 ENCOUNTER — Encounter: Payer: Self-pay | Admitting: Psychiatry

## 2013-08-04 ENCOUNTER — Other Ambulatory Visit: Payer: Self-pay | Admitting: Neurology

## 2013-08-04 ENCOUNTER — Other Ambulatory Visit: Payer: Self-pay | Admitting: Primary Care

## 2013-08-04 MED ORDER — NAPROXEN SODIUM 550 MG PO TABS *A*
550.0000 mg | ORAL_TABLET | Freq: Two times a day (BID) | ORAL | Status: DC
Start: 2013-08-04 — End: 2014-02-20

## 2013-08-04 MED ORDER — TERBINAFINE HCL 250 MG PO TABS *I*
250.0000 mg | ORAL_TABLET | Freq: Every day | ORAL | Status: DC
Start: 2013-08-04 — End: 2013-08-26

## 2013-08-04 MED ORDER — GLIMEPIRIDE 4 MG PO TABS *I*
4.0000 mg | ORAL_TABLET | Freq: Every day | ORAL | Status: DC
Start: 2013-08-04 — End: 2013-11-15

## 2013-08-04 MED ORDER — TOPIRAMATE 50 MG PO TABS *I*
50.0000 mg | ORAL_TABLET | Freq: Two times a day (BID) | ORAL | Status: DC
Start: 2013-08-04 — End: 2014-06-20

## 2013-08-04 MED ORDER — LANCETS MISC *A*
Status: DC
Start: 2013-08-04 — End: 2014-05-16

## 2013-08-04 MED ORDER — GABAPENTIN 600 MG PO TABS
600.0000 mg | ORAL_TABLET | Freq: Three times a day (TID) | ORAL | Status: DC
Start: 2013-08-04 — End: 2013-09-24

## 2013-08-04 MED ORDER — FREESTYLE LITE TEST VI STRP *A*
ORAL_STRIP | Status: DC
Start: 2013-08-04 — End: 2014-05-16

## 2013-08-04 MED ORDER — ALCOHOL WIPES PADS
MEDICATED_PAD | Status: DC
Start: 2013-08-04 — End: 2014-01-18

## 2013-08-09 ENCOUNTER — Other Ambulatory Visit: Payer: Self-pay | Admitting: Nurse Practitioner

## 2013-08-09 ENCOUNTER — Other Ambulatory Visit: Payer: Self-pay | Admitting: Primary Care

## 2013-08-09 MED ORDER — SUMATRIPTAN SUCCINATE 6 MG/0.5ML SC DEVI *A*
6.0000 mg | Freq: Once | SUBCUTANEOUS | Status: DC | PRN
Start: 2013-08-09 — End: 2016-10-28

## 2013-08-09 MED ORDER — OMEPRAZOLE 20 MG PO CPDR *I*
20.0000 mg | DELAYED_RELEASE_CAPSULE | Freq: Every day | ORAL | Status: DC
Start: 2013-08-09 — End: 2013-12-13

## 2013-08-09 NOTE — Telephone Encounter (Signed)
Request processed by [facility] 08/06/13 (#[id number])

## 2013-08-10 ENCOUNTER — Other Ambulatory Visit: Payer: Self-pay | Admitting: Nurse Practitioner

## 2013-08-10 ENCOUNTER — Encounter: Payer: Self-pay | Admitting: Primary Care

## 2013-08-10 NOTE — Telephone Encounter (Signed)
Mailed to Pharmacy.

## 2013-08-11 ENCOUNTER — Encounter: Payer: Self-pay | Admitting: Primary Care

## 2013-08-11 ENCOUNTER — Encounter: Payer: Self-pay | Admitting: Nurse Practitioner

## 2013-08-11 ENCOUNTER — Other Ambulatory Visit: Payer: Self-pay | Admitting: Nurse Practitioner

## 2013-08-11 ENCOUNTER — Ambulatory Visit: Payer: Self-pay | Admitting: Primary Care

## 2013-08-11 VITALS — BP 130/82 | HR 92 | Temp 98.9°F | Ht 68.11 in | Wt 291.4 lb

## 2013-08-11 DIAGNOSIS — L03012 Cellulitis of left finger: Secondary | ICD-10-CM

## 2013-08-11 MED ORDER — AMOXICILLIN-POT CLAVULANATE 500-125 MG PO TABS *I*
1.0000 | ORAL_TABLET | Freq: Three times a day (TID) | ORAL | Status: DC
Start: 2013-08-11 — End: 2013-08-18

## 2013-08-11 MED ORDER — SUMATRIPTAN SUCCINATE 50 MG PO TABS *I*
50.0000 mg | ORAL_TABLET | ORAL | Status: DC | PRN
Start: 2013-08-11 — End: 2013-12-13

## 2013-08-11 NOTE — Progress Notes (Signed)
[place]    Pt is here to discuss:    1. L middle finger pain, swelling, redness - Worsening for the last few days. Now turning purple. At lateral nail fold and spreading proximally. Has tried topical neosporin, soaks, gentle soaps. Denies fevers, purulent drainage, inability to move her joints. Denies preceding injury, constant chewing of her nails. Admits that this purplish discoloration looks very similar to the lesions she has occasionally on her vaginal labia. Admits to cold sore during her hospitalization at end of March, but denies chewing of her nails or fingers in her mouth.      PMH / Family Hx / Social Hx  Patient's medications, allergies, problem list, past medical, social histories were reviewed and notable for:    DM2, well controlled    Current Outpatient Prescriptions   Medication Sig    omeprazole (PRILOSEC) 20 MG capsule Take 1 capsule (20 mg total) by mouth daily (before breakfast)    gabapentin (NEURONTIN) 600 MG tablet Take 1 tablet (600 mg total) by mouth 3 times daily    topiramate (TOPAMAX) 50 MG tablet Take 1 tablet (50 mg total) by mouth 2 times daily    glimepiride (AMARYL) 4 MG tablet Take 1 tablet (4 mg total) by mouth daily (with breakfast)    lancets Brand Free Style Lite; Use 2 times per day as directed for blood glucose testing.    FREESTYLE LITE test strip Use BID as directed for 250.02    Alcohol Swabs (ALCOHOL WIPES) PADS Use BID for BG check    naproxen sodium (ANAPROX) 550 MG tablet Take 1 tablet (550 mg total) by mouth 2 times daily (with meals)    terbinafine (LAMISIL) 250 MG tablet Take 1 tablet (250 mg total) by mouth daily    Melatonin 5 MG CAPS Take 10 mg by mouth nightly    lurasidone (LATUDA) 20 MG tablet Take 1 tablet (20 mg total) by mouth daily    busPIRone (BUSPAR) 5 MG tablet Take 1 tablet (5 mg total) by mouth 3 times daily    DULoxetine (CYMBALTA) 60 MG capsule Take 1 capsule (60 mg total) by mouth daily    buPROPion  (WELLBUTRIN XL) 300 MG 24 hr tablet Take 1 tablet (300 mg total) by mouth every morning   Swallow whole. Do not crush, break, or chew.    oxyCODONE-acetaminophen (PERCOCET) 5-325 MG per tablet Take 1-2 tablets by mouth every 6 hours as needed for Pain   Max daily dose: 8 tablets    famciclovir (FAMVIR) 500 MG tablet Take 1 tablet (500 mg total) by mouth 3 times daily as needed    clidinium-chlordiazePOXIDE (LIBRAX) 5-2.5 MG per capsule Take 1 capsule by mouth 4 times daily (before meals and nightly)    cetirizine (ZYRTEC) 10 MG tablet Take 1 tablet (10 mg total) by mouth daily    blood glucose monitor system Brand: cheapest brand available per her insurance.  Use as directed.    VENTOLIN HFA 108 (90 BASE) MCG/ACT inhaler Inhale 1-2 puffs into the lungs every 4-6 hours as needed for Wheezing    fluticasone (FLONASE) 50 MCG/ACT nasal spray 1 spray by Nasal route daily    atorvastatin (LIPITOR) 40 MG tablet Take 40 mg by mouth daily (with dinner)    ranolazine (RANEXA) 500 MG 12 hr tablet Take 500 mg by mouth 2 times daily   Swallow whole. Do not crush, break, or chew.    theophylline (THEODUR) 200  MG 12 hr tablet Take 200 mg by mouth 2 times daily   Do not crush or chew. May be divided.    Bioflavonoid Products (ESTER C PO) Take 500 mg by mouth daily    cholecalciferol (VITAMIN D) 1000 UNIT capsule Take 1,000 Units by mouth daily    cyanocobalamin 500 MCG tablet Take 500 mcg by mouth daily    Non-System Medication The above patient is followed in our clinic and cannot resume work permanently.    ondansetron (ZOFRAN) 8 MG tablet Take 1 tablet (8 mg total) by mouth 3 times daily as needed for Nausea    SUMAtriptan (IMITREX) 50 MG tablet Take 1 tablet (50 mg total) by mouth as needed for Migraine   Take at onset of headache. May repeat once in 2 hours.    amoxicillin-clavulanate (AUGMENTIN) 500-125 MG per tablet Take 1 tablet by mouth 3 times daily       ROS  No current herpetic  lesions      OBJECTIVE  Filed Vitals:    08/11/13 1327   BP: 130/82   Pulse: 92   Temp: 37.2 C (98.9 F)   Height: 1.73 m (5' 8.11")   Weight: 132.178 kg (291 lb 6.4 oz)     Body mass index is 44.16 kg/(m^2).      General: well-appearing obese Caucasian female, pleasant & conversant, in NAD  Ext: L middle finger erythema of cuticle, appears worst at lateral aspect where there is a scab and slight white discoloration and localized tender swelling. Mildly violaceous hue in this area as well, and erythema extending to nailfold and skin distal to DIP. Joint with full ROM, nontender.      Attempt was made to open the area of increased swelling and tenderness using 11# blade after hibaclens prep of the nail and cuticle area. Minimal amount of bloody drainage expressed, no purulent drainage seen.        ASSESSMENT & PLAN  1. Paronychia, left  Recommended continue aggressive soaks (20min four times daily) and also given coexisting DM, treat with PO Augmentin. Did not have vesicular changes concerning for herpetic whitlow, but given the presence of preceding cold sore and ?labial lesions, would monitor for improvement and if persistence despite 36h of antibiotics, consider transitioning to antiviral.      Follow-up: Friday via phone      [name], MD  [facility]  08/11/2013  2:44 PM        ______________________

## 2013-08-13 ENCOUNTER — Telehealth: Payer: Self-pay

## 2013-08-13 NOTE — Telephone Encounter (Signed)
Still red and swollen and  painful on continuing antibiotics. Having diarrhea told is a side effect to antibiotics

## 2013-08-13 NOTE — Telephone Encounter (Signed)
Left message on machine for pt to return call.

## 2013-08-13 NOTE — Telephone Encounter (Signed)
-----   Message from [name], MD sent at 08/11/2013  2:51 PM EDT -----  Can you call pt Friday (4/10) and f/u on how her finger is responding to antibiotics?

## 2013-08-13 NOTE — Telephone Encounter (Signed)
Called and discussed sx with pt. Has been on abx x2d and having loose stool, no improvement in appearance of finger. No worsening either.     Advised this may be herpetic whitlow given lack of improvement on abx. Counseled that it will improve gradually (2-3 weeks) and not require antibiotics. No evidence that antivirals will help either. Recommended she d/c the antibiotic today, continue warm soaks and abx ointment application. If acute worsening of redness, warmth, swelling, advised she restart the antibiotic. F/u via phone if any additional concerns.    [name], MD  [facility]  08/13/2013  9:24 PM

## 2013-08-16 ENCOUNTER — Encounter: Payer: Self-pay | Admitting: Gastroenterology

## 2013-08-18 ENCOUNTER — Encounter: Payer: Self-pay | Admitting: Psychiatry

## 2013-08-18 ENCOUNTER — Ambulatory Visit: Payer: Self-pay | Admitting: Psychiatry

## 2013-08-18 ENCOUNTER — Ambulatory Visit: Payer: Self-pay

## 2013-08-18 VITALS — BP 133/82 | HR 84 | Ht 68.11 in | Wt 293.0 lb

## 2013-08-18 DIAGNOSIS — F339 Major depressive disorder, recurrent, unspecified: Secondary | ICD-10-CM

## 2013-08-18 MED ORDER — BUPROPION HCL 150 MG PO TB24 *I*
150.0000 mg | ORAL_TABLET | Freq: Every morning | ORAL | Status: DC
Start: 2013-08-18 — End: 2013-09-15

## 2013-08-18 MED ORDER — MIRTAZAPINE 7.5 MG PO TABS *A*
7.5000 mg | ORAL_TABLET | Freq: Every evening | ORAL | Status: DC
Start: 2013-08-18 — End: 2013-09-15

## 2013-08-18 MED ORDER — LURASIDONE HCL 40 MG PO TABS *I*
40.0000 mg | ORAL_TABLET | Freq: Every day | ORAL | Status: DC
Start: 2013-08-18 — End: 2013-09-15
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
## 2013-08-20 ENCOUNTER — Ambulatory Visit: Payer: Self-pay | Admitting: Nurse Practitioner

## 2013-08-20 ENCOUNTER — Other Ambulatory Visit: Payer: Self-pay | Admitting: Nurse Practitioner

## 2013-08-20 MED ORDER — CYCLOBENZAPRINE HCL 5 MG PO TABS *I*
5.0000 mg | ORAL_TABLET | Freq: Three times a day (TID) | ORAL | Status: DC | PRN
Start: 2013-08-20 — End: 2013-10-04

## 2013-08-23 ENCOUNTER — Encounter: Payer: Self-pay | Admitting: Gastroenterology

## 2013-08-23 ENCOUNTER — Ambulatory Visit: Payer: Self-pay | Admitting: Psychiatry

## 2013-08-24 ENCOUNTER — Ambulatory Visit: Payer: Self-pay | Admitting: Ophthalmology

## 2013-08-24 ENCOUNTER — Encounter: Payer: Self-pay | Admitting: Ophthalmology

## 2013-08-24 DIAGNOSIS — IMO0002 Reserved for concepts with insufficient information to code with codable children: Secondary | ICD-10-CM

## 2013-08-24 LAB — HM DIABETES EYE EXAM

## 2013-08-24 NOTE — Progress Notes (Addendum)
 Outpatient Visit      Patient name: Kaylee Harris  DOB: 1976-04-07       Age: 38 y.o.  MR#: [id number]    Encounter Date: 08/24/2013    Subjective:     Chief Complaint:   Chief Complaint   Patient presents with   . Diabetes     HPI    39 yo F with h/o DM (diagnosed in 1995, Last A1c 6.6 on 07/29/13,   diet-controlled for some time now back on oral meds) here for NPV.    History of falls/head trauma/concussion due to syncopal episodes from   neurocardiogenic syncope.    Was told in past she has a "freckle behind her eye."    HAving more trouble seeing the TV lately.         Current Outpatient Rx   Name  Route  Sig  Dispense  Refill   . glimepiride (AMARYL) 4 MG tablet    Oral    Take 1 tablet (4 mg total) by mouth daily (with breakfast)    30 tablet    3     . cyclobenzaprine (FLEXERIL) 5 MG tablet    Oral    Take 1 tablet (5 mg total) by mouth 3 times daily as needed for Muscle spasms    60 tablet    1     . dicyclomine (BENTYL) 20 MG tablet                5     . lurasidone (LATUDA) 40 MG tablet    Oral    Take 1 tablet (40 mg total) by mouth daily    30 tablet    0     . mirtazapine (REMERON) 7.5 MG tablet    Oral    Take 1 tablet (7.5 mg total) by mouth nightly    30 tablet    0     . buPROPion (WELLBUTRIN XL) 150 MG 24 hr tablet    Oral    Take 1 tablet (150 mg total) by mouth every morning   Swallow whole. Do not crush, break, or chew.    30 tablet    0     . SUMAtriptan (IMITREX) 50 MG tablet    Oral    Take 1 tablet (50 mg total) by mouth as needed for Migraine   Take at onset of headache. May repeat once in 2 hours.    9 tablet    5     . omeprazole (PRILOSEC) 20 MG capsule    Oral    Take 1 capsule (20 mg total) by mouth daily (before breakfast)    30 capsule    5       Created by Conversion     . gabapentin (NEURONTIN) 600 MG tablet    Oral    Take 1 tablet (600 mg total) by mouth 3 times daily    270 tablet    1     . topiramate (TOPAMAX) 50 MG tablet    Oral    Take 1 tablet (50 mg total) by mouth 2  times daily    60 tablet    5     . lancets        Brand Free Style Lite; Use 2 times per day as directed for blood glucose testing.    100 each    2     . FREESTYLE LITE test strip  Use BID as directed for 250.02    100 each    2     . Alcohol Swabs (ALCOHOL WIPES) PADS        Use BID for BG check    100 each    1     . naproxen sodium (ANAPROX) 550 MG tablet    Oral    Take 1 tablet (550 mg total) by mouth 2 times daily (with meals)    60 tablet    5     . terbinafine (LAMISIL) 250 MG tablet    Oral    Take 1 tablet (250 mg total) by mouth daily    30 tablet    0     . Melatonin 5 MG CAPS    Oral    Take 10 mg by mouth nightly    60 each    0     . busPIRone (BUSPAR) 5 MG tablet    Oral    Take 1 tablet (5 mg total) by mouth 3 times daily    90 tablet    0     . DULoxetine (CYMBALTA) 60 MG capsule    Oral    Take 1 capsule (60 mg total) by mouth daily    60 capsule    0     . famciclovir (FAMVIR) 500 MG tablet    Oral    Take 1 tablet (500 mg total) by mouth 3 times daily as needed    30 tablet    5     . cetirizine (ZYRTEC) 10 MG tablet    Oral    Take 1 tablet (10 mg total) by mouth daily    30 tablet    5     . blood glucose monitor system        Brand: cheapest brand available per her insurance.  Use as directed.    1 kit    0     . VENTOLIN HFA 108 (90 BASE) MCG/ACT inhaler    Inhalation    Inhale 1-2 puffs into the lungs every 4-6 hours as needed for Wheezing    1 Inhaler    5       Dispense as written.     Created by Conversion     . fluticasone (FLONASE) 50 MCG/ACT nasal spray    Nasal    1 spray by Nasal route daily    16 g    5       Created by Conversion     . atorvastatin (LIPITOR) 40 MG tablet    Oral    Take 40 mg by mouth daily (with dinner)             . ranolazine (RANEXA) 500 MG 12 hr tablet    Oral    Take 500 mg by mouth 2 times daily   Swallow whole. Do not crush, break, or chew.             . theophylline (THEODUR) 200 MG 12 hr tablet    Oral    Take 200 mg by mouth 2 times daily   Do  not crush or chew. May be divided.             Marland Kitchen Bioflavonoid Products (ESTER C PO)    Oral    Take 500 mg by mouth daily             . cholecalciferol (VITAMIN D) 1000 UNIT capsule  Oral    Take 1,000 Units by mouth daily             . cyanocobalamin 500 MCG tablet    Oral    Take 500 mcg by mouth daily             . Non-System Medication        The above patient is followed in our clinic and cannot resume work permanently.    1 each    1     . ondansetron (ZOFRAN) 8 MG tablet    Oral    Take 1 tablet (8 mg total) by mouth 3 times daily as needed for Nausea    90 tablet    5        Allergies: Morphine and Trazodone     Medical History:   Past Medical History   Diagnosis Date   . Diabetes mellitus      Previously on SU and metformin, now diet controlled   . Asthma    . Allergy history unknown    . GERD (gastroesophageal reflux disease)    . Dysfunctional uterine bleeding    . Diverticulitis 09/2010   . Sebaceous cyst of breast      right axilla   . Anginal pain    . Hyperlipidemia         Surgical History:   Past Surgical History   Procedure Laterality Date   . Dilation and curettage of uterus  2005, 2007     x2 for menorraghia   . Tonsillectomy     . Cardiac catheterization  09/2011     negative   . Loop recorder  Feb 03 2014   . Arthroscopic shoulder surgery Right 2010     Related to lifting          Objective:     Base Eye Exam    Visual Acuity (Snellen - Linear)     Right Left   Dist cc 20/30 -2 20/25 -2       Correction:  Glasses      Tonometry (Tonopen, 1:40 PM)     Right Left   Pressure 11 13         Pupils     Pupils APD   Right PERRLA None   Left PERRLA None         Visual Fields     Left Right   Result Full Full         Dilation    Both eyes:  2.5% Phenylephrine, 1.0% Tropicamide @ 1:42 PM            Slit Lamp and Fundus Exam    External Exam     Right Left    External Normal ocular adnexae, lacrimal gland & drainage, orbits Normal ocular adnexae, lacrimal gland & drainage, orbits      Slit Lamp Exam      Right Left    Lids/Lashes Normal structure & position Normal structure & position    Conjunctiva/Sclera Normal bulbar/palpebral, conjunctiva, sclera Normal bulbar/palpebral, conjunctiva, sclera    Cornea Normal epithelium, stroma, endothelium, tear film Normal epithelium, stroma, endothelium, tear film    Anterior Chamber Clear & deep Clear & deep    Iris Normal shape, size, morphology Normal shape, size, morphology    Lens Normal cortex, nucleus, anterior/posterior capsule, clarity Normal cortex, nucleus, anterior/posterior capsule, clarity    Vitreous Clear Clear      Fundus Exam     Right  Left    Disc Normal size, appearance, nerve fiber layer Normal size, appearance, nerve fiber layer    C/D Ratio 0.1 0.1    Macula Normal Normal    Vessels Normal Normal    Periphery Normal 2 DD choroidal nevus along STA with overlying drusen, no SRF or orange pigment      Edited by: Haywood Filler, MD            Refraction    Wearing Rx     Sphere Cylinder Axis   Right +2.50 +0.50 010   Left +2.25 +1.25 150       Age:  2 years    Type:  SVL      Manifest Refraction     Sphere Cylinder Axis Dist   Right +0.50 +0.50 020 20/20   Left +0.50 +1.50 155 20/25         Final Rx     Sphere Cylinder Axis   Right +0.50 +0.50 020   Left +0.50 +1.50 155       Type:  SVL              Final Rx     Sphere Cylinder Axis   Right +0.50 +0.50 020   Left +0.50 +1.50 155       Type:  SVL              No annotated images are attached to the encounter.      Assessment/Plan:     DM (diabetes mellitus), type 2, uncontrolled  -The patient is without signs of diabetic retinopathy or diabetic macular edema.  The patient was counseled to report any visual changes, to regularly check serum glucose levels as defined by primary physician, to optimize diet, to take prescribed medications, to keep scheduled appointments, and to optimize blood glucose control with HgA1C less than or equal to 7 or as primary physician has specified.  Communication will be  forwarded to the responsible physician managing the patient's diabetes.  Follow up has been arranged for 1 year.    Choroidal Nevus, Left Eye  -Small, flat  -No high risk features  -Fundus photos taken today  -Rec

## 2013-08-24 NOTE — Assessment & Plan Note (Signed)
-  The patient is without signs of diabetic retinopathy or diabetic macular edema.  The patient was counseled to report any visual changes, to regularly check serum glucose levels as defined by primary physician, to optimize diet, to take prescribed medications, to keep scheduled appointments, and to optimize blood glucose control with HgA1C less than or equal to 7 or as primary physician has specified.  Communication will be forwarded to the responsible physician managing the patient's diabetes.  Follow up has been arranged for 1 year.    Choroidal Nevus, Left Eye  -Small, flat  -No high risk features  -Fundus photos taken today  -Recheck in 6 months    Refractive Error  -MRx updated and dispensed

## 2013-08-25 ENCOUNTER — Other Ambulatory Visit: Payer: Self-pay | Admitting: Psychiatry

## 2013-08-25 MED ORDER — DULOXETINE HCL 60 MG PO CPEP *I*
60.0000 mg | DELAYED_RELEASE_CAPSULE | Freq: Every day | ORAL | Status: DC
Start: 2013-08-25 — End: 2013-09-15

## 2013-08-25 MED ORDER — BUSPIRONE HCL 5 MG PO TABS *I*
5.0000 mg | ORAL_TABLET | Freq: Three times a day (TID) | ORAL | Status: DC
Start: 2013-08-25 — End: 2013-09-15

## 2013-08-26 ENCOUNTER — Other Ambulatory Visit: Payer: Self-pay | Admitting: Primary Care

## 2013-08-30 ENCOUNTER — Ambulatory Visit: Payer: Self-pay

## 2013-08-30 NOTE — Progress Notes (Signed)
Behavioral Health Progress Note     LENGTH OF SESSION: 60 minutes    Contact Type:  Location: On Site    Face to Face     Problem(s)/Goals Addressed from Treatment Plan:    Problem 1:   R PSY TP PROBLEM 07/15/2013   1ST TREATMENT PLAN PROBLEM Depression and anxiety       Goal for this problem:    R PSY TP GOAL 07/15/2013   1ST TREATMENT PLAN GOAL Learn DBT and PST skills to manage affect       Progress towards this goal: Problem resolving. Comment Patient brought her wife in and processed issues with her.     Mental Status Exam:  APPEARANCE: Appears stated age, Well-groomed, Casual  ATTITUDE TOWARD INTERVIEWER: Cooperative  MOTOR ACTIVITY: WNL (within normal limits)  EYE CONTACT: Avoidant  SPEECH: Normal rate and tone  AFFECT: Anxious and Depressed  MOOD: Anxious  THOUGHT PROCESS: Normal  THOUGHT CONTENT: Negative Rumination  PERCEPTION: No evidence of hallucinations  ORIENTATION: Alert and Oriented X 3.  CONCENTRATION: Good  MEMORY:   Recent: intact   Remote: intact  COGNITIVE FUNCTION: Average intelligence  JUDGMENT: Intact  IMPULSE CONTROL: Good  INSIGHT: Fair    Risk Assessment:  ASSESSMENT OF RISK FOR SUICIDAL BEHAVIOR  Changes in risk for suicide from baseline Formulation of Risk and/or previous intake, including newly identified risk, if any: none  Violence risk was assessed and No Change noted from baseline formulation of risk and/or previous assessment.    Session Content/Interventions:  SESSION CONTENT: Patient brought her wife in to session to process issues. She said that she was able to talk to her father about how she felt excluded, but that this did not change things. Writer facilitated work around patient talking to her partner about mood, behaviors, safety plan, negative self image. Writer reinforced patient's use of skills. Patient and her wife discussed plans for managing negative coping strategies.    INTERVENTIONS: Supportive counseling, DBT    Plan:  Psychotherapy continues as described in care  plan; plan remains the same. and Other planned interventions or recommendations: Referral to psychopharm    NEXT APPT: 09/13/13 @4p .m.      Tresa Res, LMSW

## 2013-09-02 ENCOUNTER — Telehealth: Payer: Self-pay | Admitting: Primary Care

## 2013-09-02 ENCOUNTER — Other Ambulatory Visit: Payer: Self-pay | Admitting: Psychiatry

## 2013-09-02 ENCOUNTER — Other Ambulatory Visit: Payer: Self-pay | Admitting: Primary Care

## 2013-09-02 NOTE — Telephone Encounter (Signed)
Due to Pt medical conditions, Pt is still in need of transportation. Medicaid has been sending her bus passes, but Pt needs to be taken to appointments by taxi. A form was filled out, but company stated they did not receive it. Requesting a phone call stating why Pt needs transportation. Writer has refaxed the form.     Transportation # [phone]    Pt # [phone] (able to leave message through [place])

## 2013-09-03 ENCOUNTER — Ambulatory Visit
Admit: 2013-09-03 | Discharge: 2013-10-03 | Disposition: A | Discharge status: Home / Self Care | Payer: Self-pay | Source: Ambulatory Visit | Attending: Psychiatry | Admitting: Psychiatry

## 2013-09-04 ENCOUNTER — Encounter: Payer: Self-pay | Admitting: Primary Care

## 2013-09-04 MED ORDER — VENTOLIN HFA 108 (90 BASE) MCG/ACT IN AERS
1.0000 | INHALATION_SPRAY | RESPIRATORY_TRACT | Status: DC | PRN
Start: 2013-09-03 — End: 2014-03-24

## 2013-09-06 ENCOUNTER — Ambulatory Visit: Payer: Self-pay | Admitting: Psychiatry

## 2013-09-06 ENCOUNTER — Encounter: Payer: Self-pay | Admitting: Gastroenterology

## 2013-09-07 MED ORDER — TERBINAFINE HCL 250 MG PO TABS *I*
250.0000 mg | ORAL_TABLET | Freq: Every day | ORAL | Status: DC
Start: 2013-09-07 — End: 2013-10-28

## 2013-09-07 NOTE — Telephone Encounter (Signed)
Discussed with pt multiple complaints:    Low back pain and upper back pain. She cites previous MRI results that showed pinched nerves and stenosis. I reviewed with her the only results I have are from cervical MRI in 2014 which did not show this. She also tells me she had an MRI in [place] a few years ago. C/o low back pain with radiation down one of her legs, neck pain with upper extremity numbness. Getting to the point where she is unable to shower and wash her legs, relies on her wife for this assistance. Asking what else she can do as the Naproxen, flexeril, gabapentin and cymbalta are not helping.    Reviewed with pt that we have not yet discussed this in a visit nor done a thorough exam of her back or arms. Suggested that we schedule an appointment for this (does not qualify as acute / same day visit but would be a next available follow up appointment). In the meantime, if she is able to speak with her neurologist who prescribed the flexeril and they know her history/exam and would recommend an MRI, I asked that she have them order it.    [name], MD  [facility]  09/07/2013  5:42 PM

## 2013-09-08 ENCOUNTER — Telehealth: Payer: Self-pay | Admitting: Ophthalmology

## 2013-09-08 ENCOUNTER — Encounter: Payer: Self-pay | Admitting: Primary Care

## 2013-09-08 ENCOUNTER — Telehealth: Payer: Self-pay | Admitting: Primary Care

## 2013-09-08 NOTE — Telephone Encounter (Signed)
Error

## 2013-09-08 NOTE — Telephone Encounter (Signed)
Pt still cant see when she looks down and would like to try bifocals as long as it does not have a line in them. Pt was seen on 08/24/13

## 2013-09-08 NOTE — Telephone Encounter (Signed)
Please see below, thank you.

## 2013-09-08 NOTE — Telephone Encounter (Signed)
Prior Auth needed for Terbinafine HCL 250mg  Tablet-Form from pharmacy in your folder

## 2013-09-09 ENCOUNTER — Encounter: Payer: Self-pay | Admitting: Primary Care

## 2013-09-09 ENCOUNTER — Ambulatory Visit: Payer: Self-pay | Admitting: Primary Care

## 2013-09-09 ENCOUNTER — Encounter: Payer: Self-pay | Admitting: Ophthalmology

## 2013-09-09 VITALS — BP 130/84 | HR 101 | Temp 97.3°F | Ht 68.0 in | Wt 297.0 lb

## 2013-09-09 DIAGNOSIS — S90229A Contusion of unspecified lesser toe(s) with damage to nail, initial encounter: Secondary | ICD-10-CM

## 2013-09-09 DIAGNOSIS — M545 Low back pain, unspecified: Secondary | ICD-10-CM

## 2013-09-09 DIAGNOSIS — R202 Paresthesia of skin: Secondary | ICD-10-CM

## 2013-09-09 LAB — FOLATE: Folate: 12.5 ng/mL (ref >=4.6)

## 2013-09-09 LAB — TSH: TSH: 2.17 u[IU]/mL (ref 0.27–4.20)

## 2013-09-09 LAB — VITAMIN B12: Vitamin B12: 765 pg/mL (ref 211–946)

## 2013-09-09 NOTE — Progress Notes (Signed)
[facility]    SUBJECTIVE    Pt is here to discuss:    Chief Complaint   Patient presents with    Anxiety     1. LBP - Points to b/l SI joints, R>L. Exacerbated by sitting/lying for prolonged periods of time, but also while standing in the shower. Has separate L lateral thigh numbness, but she thinks this started following her R shoulder surgery in 2010. Points to ITB distribution. Does not extend beyond knee. Does not appear to be in anterior femoral cutaneous n distribution.     2. Hand numbness - Throughout the day, especially with activity like writing, using cell phone. Has weakness and is dropping things. Tells me that it's her entire hand and even involves her forearm. R>L. Denies triggering of fingers. Had previous MRI of cspine in September which showed "prominent canals" of C5/6 level without stenosis or NF impingment.    3.  L foot discoloration - 2nd toe typically turns blue if walking. Recalls she had numbness there when we did monofilament testing last visit. Recently noticed that she has spot underneath her big toe, wondering what that may be as she does not recall trauma to the area.    PMH / Family Hx / Social Hx  Patient's medications, allergies, problem list, past medical, social histories were reviewed and notable for:    H/o neurocardiogenic syncope, major depression, morbid obestiy, fibromyalgia    Current Outpatient Prescriptions   Medication Sig Note    terbinafine (LAMISIL) 250 MG tablet Take 1 tablet (250 mg total) by mouth daily     VENTOLIN HFA 108 (90 BASE) MCG/ACT inhaler Inhale 1-2 puffs into the lungs every 4-6 hours as needed for Wheezing     busPIRone (BUSPAR) 5 MG tablet Take 1 tablet (5 mg total) by mouth 3 times daily     DULoxetine (CYMBALTA) 60 MG capsule Take 1 capsule (60 mg total) by mouth daily     cyclobenzaprine (FLEXERIL) 5 MG tablet Take 1 tablet (5 mg total) by mouth 3 times daily as needed for Muscle spasms     dicyclomine (BENTYL) 20 MG tablet   08/18/2013: Received from: External Pharmacy    lurasidone (LATUDA) 40 MG tablet Take 1 tablet (40 mg total) by mouth daily     mirtazapine (REMERON) 7.5 MG tablet Take 1 tablet (7.5 mg total) by mouth nightly     buPROPion (WELLBUTRIN XL) 150 MG 24 hr tablet Take 1 tablet (150 mg total) by mouth every morning   Swallow whole. Do not crush, break, or chew.     SUMAtriptan (IMITREX) 50 MG tablet Take 1 tablet (50 mg total) by mouth as needed for Migraine   Take at onset of headache. May repeat once in 2 hours.     omeprazole (PRILOSEC) 20 MG capsule Take 1 capsule (20 mg total) by mouth daily (before breakfast)     gabapentin (NEURONTIN) 600 MG tablet Take 1 tablet (600 mg total) by mouth 3 times daily     topiramate (TOPAMAX) 50 MG tablet Take 1 tablet (50 mg total) by mouth 2 times daily     glimepiride (AMARYL) 4 MG tablet Take 1 tablet (4 mg total) by mouth daily (with breakfast)     lancets Brand Free Style Lite; Use 2 times per day as directed for blood glucose testing.     FREESTYLE LITE test strip Use BID as directed for 250.02     Alcohol Swabs (ALCOHOL WIPES) PADS Use  BID for BG check     naproxen sodium (ANAPROX) 550 MG tablet Take 1 tablet (550 mg total) by mouth 2 times daily (with meals)     Melatonin 5 MG CAPS Take 10 mg by mouth nightly     famciclovir (FAMVIR) 500 MG tablet Take 1 tablet (500 mg total) by mouth 3 times daily as needed     cetirizine (ZYRTEC) 10 MG tablet Take 1 tablet (10 mg total) by mouth daily     blood glucose monitor system Brand: cheapest brand available per her insurance.  Use as directed.     fluticasone (FLONASE) 50 MCG/ACT nasal spray 1 spray by Nasal route daily     atorvastatin (LIPITOR) 40 MG tablet Take 40 mg by mouth daily (with dinner)     ranolazine (RANEXA) 500 MG 12 hr tablet Take 500 mg by mouth 2 times daily   Swallow whole. Do not crush, break, or chew.     theophylline (THEODUR) 200 MG 12 hr tablet Take 200 mg by mouth 2 times daily   Do not  crush or chew. May be divided.     Bioflavonoid Products (ESTER C PO) Take 500 mg by mouth daily     cholecalciferol (VITAMIN D) 1000 UNIT capsule Take 1,000 Units by mouth daily     cyanocobalamin 500 MCG tablet Take 500 mcg by mouth daily     Non-System Medication The above patient is followed in our clinic and cannot resume work permanently.     ondansetron (ZOFRAN) 8 MG tablet Take 1 tablet (8 mg total) by mouth 3 times daily as needed for Nausea        ROS  Denies falls related to pain or weakness  Pain/numbness in hand occasionally radiates up forearm but not up proximal arm        OBJECTIVE  Filed Vitals:    09/09/13 1355   BP: 130/84   Pulse: 101   Temp: 36.3 C (97.3 F)   TempSrc: Temporal   Height: 1.727 m (5\' 8" )   Weight: 134.718 kg (297 lb)     Body mass index is 45.17 kg/(m^2).      General: well-appearing morbidly obese Caucasian female, pleasant & conversant, in NAD  Psych: positive mood and affect, but does bring up symptoms at irregular times, seems to spend significant time thinking through some questions, acts younger than stated age  MSK: no weakness on thumb opposition b/l. Neg PHalen's and tinnels' signs. No pain on resisted supinaton or wrist extension. No ECR tenderness. Mildly positive finkelstein's test, R>L.   Back: +FABER testing. Neg seated and SLR. Patellar tendons 2+ b/l. ITB mildly TTP  Ext: Distal DP pulses 1+ b/l. No obvious blue discoloration to toes today. L first toe shows dark red/black pinpoint spot in mid aspect of proximal nailbed. There is some similar small discoloration at proximal nailfold as well. No obvious or persistent onychomycosis.        ASSESSMENT & PLAN  1. Paresthesia of hand  No physical evidence today of carpal tunnel. Reviewed MRI of cspine which does not show central cause.Will get serum studies as below to r/o systemic cause. Consider EMG as next step  - Vitamin B12; Future  - TSH; Future  - Folate; Future  - Vitamin D; Future  - Protein  electrophoresis, serum; Future  - Vitamin B12  - TSH  - Folate  - Vitamin D  - Protein electrophoresis, serum    2. Low back pain  Suspect SI joint discomfort and ITB tendinopathy related to body habitus and obesity. Recommended daily stretches (ex. Child's pose), and given hand out. Encouraged weight loss via yoga or aquatherapy. Pt brought in previous MRI of lumbar spine from [place]; will upload this and the read into the system. At this point do no think she requires another MRI unless significant pathology on previous one.     3. Subungual hematoma of toenail  Likely related to trauma she does not recall. Given the discolroation on her proximal nail fold however, will keep close eye on it and if they don't resolve, will refer to derm for nail avulsion and biopsy of underlying lesion.     Follow-up: as scheduled in June      [name], MD  [facility]  09/09/2013  2:01 PM        ______________________

## 2013-09-09 NOTE — Telephone Encounter (Signed)
Err

## 2013-09-09 NOTE — Telephone Encounter (Signed)
Pt is coming in today. Terbinafine is not covered under insurance plan. Please ask pt to call her insurance plane and find out what is covered.

## 2013-09-10 LAB — PROTEIN ELECTROPHORESIS, SERUM
A/G Ratio: 1.2 (ref 0.9–1.8)
Albumin: 3.8 g/dL (ref 3.5–5.1)
Alpha 1: 0.4 g/dL (ref 0.2–0.4)
Alpha 2: 0.8 g/dL (ref 0.4–0.9)
Beta: 0.9 g/dL (ref 0.5–1.0)
Gamma: 1 g/dL (ref 0.7–1.4)
Interp,PE: NORMAL
Total Protein: 6.8 g/dL (ref 6.3–7.7)

## 2013-09-10 LAB — PE ELECT,REVIEW

## 2013-09-10 NOTE — Telephone Encounter (Signed)
Spoke with patient  She said that at her visit yesterday she discussed this & she will  Not be taking it any longer

## 2013-09-10 NOTE — Telephone Encounter (Signed)
Patient was in yesterday and forgot to talk to you about this, are we able to do this? (see message below)

## 2013-09-10 NOTE — Telephone Encounter (Signed)
Can you find the form that they need and put it in my office?    [name], MD  [facility]  09/10/2013  9:58 AM

## 2013-09-10 NOTE — Telephone Encounter (Signed)
Please see below, thank you.

## 2013-09-13 ENCOUNTER — Ambulatory Visit: Payer: Self-pay | Admitting: Psychiatry

## 2013-09-13 ENCOUNTER — Encounter: Payer: Self-pay | Admitting: Gastroenterology

## 2013-09-13 ENCOUNTER — Ambulatory Visit: Payer: Self-pay

## 2013-09-13 LAB — VITAMIN D
25-OH VIT D2: 14 ng/mL
25-OH VIT D3: 19 ng/mL
25-OH Vit Total: 33 ng/mL (ref 30–60)

## 2013-09-13 NOTE — Progress Notes (Signed)
Behavioral Health Progress Note     LENGTH OF SESSION: 50 minutes    Contact Type:  Location: On Site    Face to Face     Problem(s)/Goals Addressed from Treatment Plan:    Problem 1:   R PSY TP PROBLEM 07/15/2013   1ST TREATMENT PLAN PROBLEM Depression and anxiety       Goal for this problem:    R PSY TP GOAL 07/15/2013   1ST TREATMENT PLAN GOAL Learn DBT and PST skills to manage affect       Progress towards this goal:  Patient disclosed heavy ETOH use that she had not previously mentioned.     Mental Status Exam:  APPEARANCE: Appears stated age, Well-groomed, Casual  ATTITUDE TOWARD INTERVIEWER: Cooperative  MOTOR ACTIVITY: WNL (within normal limits)  EYE CONTACT: Avoidant  SPEECH: Normal rate and tone  AFFECT: Anxious and Depressed  MOOD: Anxious  THOUGHT PROCESS: Normal  THOUGHT CONTENT: Negative Rumination  PERCEPTION: No evidence of hallucinations  ORIENTATION: Alert and Oriented X 3.  CONCENTRATION: Good  MEMORY:   Recent: intact   Remote: intact  COGNITIVE FUNCTION: Average intelligence  JUDGMENT: Intact  IMPULSE CONTROL: Good  INSIGHT: Fair    Risk Assessment:  ASSESSMENT OF RISK FOR SUICIDAL BEHAVIOR  Changes in risk for suicide from baseline Formulation of Risk and/or previous intake, including newly identified risk, if any: none  Violence risk was assessed and No Change noted from baseline formulation of risk and/or previous assessment.    Session Content/Interventions:  SESSION CONTENT: Patient talked about her dizzy spell last week in group. She said that this had been anxiety provoked as another group member was talking about people being molested, then another member mentioned something about gays. Patient said that she started to get hot, losing feeling in her arms and legs, etc. She said that she felt foolish about this. Patient said that she is still eating unhealthy snacks. Patient talked about her ETOH use. She said that a friend had come over last weekend and that she and the friend drank in  excess. Patient said that she then told her wife that this friend knew about her affair and that she wound up banging her head against the wall. Writer and patient discussed patient's drinking habits and patient said that on Mother's Day she drank approximately a full bottle of wine by herself. She said that prior to this they had all gone out for her father's birthday dinner and her brother ordered two bottles of wine with dinner and she drank a great deal. Writer asked patient to keep track of how much she is drinking. Writer talked to patient about the dangers of drinking with her medical conditions and medications and advised patient to discuss her ETOH use with [name], NP. Patient said that she was able to attend a Multiple Sclerosis event her mother and wife without anxiety.    INTERVENTIONS: Supportive counseling, DBT    Plan:  Psychotherapy continues as described in care plan; plan remains the same. and Other planned interventions or recommendations: Referral to psychopharm    NEXT APPT: 09/20/13 @3p .m.      Tresa Res, LMSW

## 2013-09-14 ENCOUNTER — Encounter: Payer: Self-pay | Admitting: Primary Care

## 2013-09-14 DIAGNOSIS — R202 Paresthesia of skin: Secondary | ICD-10-CM

## 2013-09-15 ENCOUNTER — Encounter: Payer: Self-pay | Admitting: Primary Care

## 2013-09-15 ENCOUNTER — Other Ambulatory Visit: Payer: Self-pay | Admitting: Psychiatry

## 2013-09-15 ENCOUNTER — Ambulatory Visit: Payer: Self-pay | Admitting: Psychiatry

## 2013-09-15 MED ORDER — LURASIDONE HCL 40 MG PO TABS *I*
40.0000 mg | ORAL_TABLET | Freq: Every day | ORAL | Status: DC
Start: 2013-09-15 — End: 2013-10-04

## 2013-09-15 MED ORDER — BUPROPION HCL 150 MG PO TB24 *I*
150.0000 mg | ORAL_TABLET | Freq: Every morning | ORAL | Status: DC
Start: 2013-09-15 — End: 2013-10-04

## 2013-09-15 MED ORDER — MIRTAZAPINE 7.5 MG PO TABS *A*
7.5000 mg | ORAL_TABLET | Freq: Every evening | ORAL | Status: DC
Start: 2013-09-15 — End: 2013-10-04

## 2013-09-15 MED ORDER — BUSPIRONE HCL 5 MG PO TABS *I*
5.0000 mg | ORAL_TABLET | Freq: Three times a day (TID) | ORAL | Status: DC
Start: 2013-09-15 — End: 2013-10-04

## 2013-09-15 MED ORDER — DULOXETINE HCL 60 MG PO CPEP *I*
60.0000 mg | DELAYED_RELEASE_CAPSULE | Freq: Every day | ORAL | Status: DC
Start: 2013-09-15 — End: 2013-10-04

## 2013-09-15 NOTE — Telephone Encounter (Signed)
Paperwork has been filled out and is the doctors office to be filled out

## 2013-09-16 NOTE — Telephone Encounter (Signed)
Paperwork has been completed and faxed back to transportation

## 2013-09-17 ENCOUNTER — Encounter: Payer: Self-pay | Admitting: Primary Care

## 2013-09-17 ENCOUNTER — Encounter: Payer: Self-pay | Admitting: Neurology

## 2013-09-20 ENCOUNTER — Ambulatory Visit: Payer: Self-pay | Admitting: Psychiatry

## 2013-09-20 ENCOUNTER — Ambulatory Visit: Payer: Self-pay

## 2013-09-20 NOTE — Progress Notes (Signed)
Behavioral Health Progress Note     LENGTH OF SESSION: 60 minutes    Contact Type:  Location: On Site    Face to Face     Problem(s)/Goals Addressed from Treatment Plan:    Problem 1:   R PSY TP PROBLEM 07/15/2013   1ST TREATMENT PLAN PROBLEM Depression and anxiety       Goal for this problem:    R PSY TP GOAL 07/15/2013   1ST TREATMENT PLAN GOAL Learn DBT and PST skills to manage affect       Progress towards this goal: Problem resolving. Comment Patient brought her wife in to talk about ETOH use.     Mental Status Exam:  APPEARANCE: Appears stated age, Well-groomed, Casual  ATTITUDE TOWARD INTERVIEWER: Cooperative  MOTOR ACTIVITY: WNL (within normal limits)  EYE CONTACT: Indirect  SPEECH: Normal rate and tone  AFFECT: Anxious and Depressed  MOOD: Anxious  THOUGHT PROCESS: Normal  THOUGHT CONTENT: Negative Rumination  PERCEPTION: No evidence of hallucinations  ORIENTATION: Alert and Oriented X 3.  CONCENTRATION: Good  MEMORY:   Recent: intact   Remote: intact  COGNITIVE FUNCTION: Average intelligence  JUDGMENT: Intact  IMPULSE CONTROL: Good  INSIGHT: Fair    Risk Assessment:  ASSESSMENT OF RISK FOR SUICIDAL BEHAVIOR  Changes in risk for suicide from baseline Formulation of Risk and/or previous intake, including newly identified risk, if any: none  Violence risk was assessed and No Change noted from baseline formulation of risk and/or previous assessment.    Session Content/Interventions:  SESSION CONTENT: Patient brought her wife in to discuss her ETOH use and things that had come up in her last session. Patient told Probation officer that she and her wife had discussed her drinking and agreed that it was not a problem as she does not do this every day. Writer talked to patient and her wife and patient's habit of binge drinking and how this is a danger and patient said that she understood. Patient's wife left the session. Patient talked about the anniversary of her sister-in-law's death and feeling around this. She said that  she was very upset that her brother won't talk to her about this. Writer and patient discussed patient [place] or something similar to mark this anniversary and if her other family members wanted to come fine, if not, let it go. Patient told Probation officer that her spouse had expressed SI and that this had been very upsetting. Writer asked patient if she wanted her spouse back in session to talk about this and patient said yes. Patient's spouse returned to session and patient processed her thoughts and feelings about her spouse's SI. Patient told her spouse that she felt her spouse would benefit from therapy. Patient's spouse talked about the stress she has been feeling. Writer and patient normalized patient's spouse's thoughts and feelings.    INTERVENTIONS: Supportive counseling, DBT, interpersonal  Plan:  Psychotherapy continues as described in care plan; plan remains the same. and Other planned interventions or recommendations: Referral to psychopharm    NEXT APPT: 10/05/13 @2 :30p.m.      Tresa Res, LMSW

## 2013-09-22 ENCOUNTER — Encounter: Payer: Self-pay | Admitting: Primary Care

## 2013-09-24 ENCOUNTER — Other Ambulatory Visit: Payer: Self-pay | Admitting: Nurse Practitioner

## 2013-10-03 ENCOUNTER — Other Ambulatory Visit: Payer: Self-pay | Admitting: Nurse Practitioner

## 2013-10-04 ENCOUNTER — Ambulatory Visit: Payer: Self-pay

## 2013-10-04 ENCOUNTER — Encounter: Payer: Self-pay | Admitting: Gastroenterology

## 2013-10-04 ENCOUNTER — Other Ambulatory Visit: Payer: Self-pay | Admitting: Neurology

## 2013-10-04 ENCOUNTER — Ambulatory Visit: Payer: Self-pay | Admitting: Psychiatry

## 2013-10-04 ENCOUNTER — Ambulatory Visit
Admit: 2013-10-04 | Discharge: 2013-11-02 | Disposition: A | Discharge status: Home / Self Care | Payer: Self-pay | Source: Ambulatory Visit | Attending: Psychiatry | Admitting: Psychiatry

## 2013-10-04 VITALS — BP 138/80 | HR 100 | Ht 68.11 in | Wt 297.0 lb

## 2013-10-04 DIAGNOSIS — G5600 Carpal tunnel syndrome, unspecified upper limb: Secondary | ICD-10-CM | POA: Insufficient documentation

## 2013-10-04 DIAGNOSIS — F419 Anxiety disorder, unspecified: Secondary | ICD-10-CM

## 2013-10-04 DIAGNOSIS — F429 Obsessive-compulsive disorder, unspecified: Secondary | ICD-10-CM

## 2013-10-04 DIAGNOSIS — F339 Major depressive disorder, recurrent, unspecified: Secondary | ICD-10-CM

## 2013-10-04 DIAGNOSIS — F422 Mixed obsessional thoughts and acts: Secondary | ICD-10-CM | POA: Insufficient documentation

## 2013-10-04 MED ORDER — MIRTAZAPINE 7.5 MG PO TABS *A*
7.5000 mg | ORAL_TABLET | Freq: Every evening | ORAL | Status: DC
Start: 2013-10-04 — End: 2013-12-13

## 2013-10-04 MED ORDER — LURASIDONE HCL 60 MG PO TABS *A*
60.0000 mg | ORAL_TABLET | Freq: Every day | ORAL | Status: DC
Start: 2013-10-04 — End: 2013-11-15

## 2013-10-04 MED ORDER — BUSPIRONE HCL 10 MG PO TABS *I*
10.0000 mg | ORAL_TABLET | Freq: Two times a day (BID) | ORAL | Status: DC
Start: 2013-10-04 — End: 2013-11-01

## 2013-10-04 MED ORDER — DULOXETINE HCL 60 MG PO CPEP *I*
60.0000 mg | DELAYED_RELEASE_CAPSULE | Freq: Every day | ORAL | Status: DC
Start: 2013-10-04 — End: 2014-05-18

## 2013-10-04 MED ORDER — CYCLOBENZAPRINE HCL 5 MG PO TABS *I*
5.0000 mg | ORAL_TABLET | Freq: Three times a day (TID) | ORAL | Status: DC | PRN
Start: 2013-10-04 — End: 2013-11-01
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
## 2013-10-05 ENCOUNTER — Ambulatory Visit: Payer: Self-pay

## 2013-10-06 ENCOUNTER — Encounter: Payer: Self-pay | Admitting: Primary Care

## 2013-10-06 DIAGNOSIS — G5712 Meralgia paresthetica, left lower limb: Secondary | ICD-10-CM | POA: Insufficient documentation

## 2013-10-11 ENCOUNTER — Ambulatory Visit: Payer: Self-pay | Admitting: Psychiatry

## 2013-10-11 ENCOUNTER — Encounter: Payer: Self-pay | Admitting: Gastroenterology

## 2013-10-18 ENCOUNTER — Encounter: Payer: Self-pay | Admitting: Gastroenterology

## 2013-10-18 ENCOUNTER — Ambulatory Visit: Payer: Self-pay | Admitting: Psychiatry

## 2013-10-19 ENCOUNTER — Ambulatory Visit: Payer: Self-pay | Admitting: Psychiatry

## 2013-10-19 ENCOUNTER — Ambulatory Visit: Payer: Self-pay
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
## 2013-10-20 ENCOUNTER — Encounter: Payer: Self-pay | Admitting: Gastroenterology

## 2013-10-21 ENCOUNTER — Ambulatory Visit: Payer: Self-pay | Admitting: Neurology

## 2013-10-21 ENCOUNTER — Encounter: Payer: Self-pay | Admitting: Neurology

## 2013-10-22 ENCOUNTER — Encounter: Payer: Self-pay | Admitting: Primary Care

## 2013-10-25 ENCOUNTER — Encounter: Payer: Self-pay | Admitting: Gastroenterology

## 2013-10-25 ENCOUNTER — Ambulatory Visit: Payer: Self-pay | Admitting: Psychiatry

## 2013-10-28 ENCOUNTER — Encounter: Payer: Self-pay | Admitting: Primary Care

## 2013-10-28 ENCOUNTER — Ambulatory Visit: Payer: Self-pay | Admitting: Primary Care

## 2013-10-28 VITALS — BP 120/80 | HR 98 | Temp 97.8°F | Ht 68.0 in | Wt 304.0 lb

## 2013-10-28 DIAGNOSIS — E119 Type 2 diabetes mellitus without complications: Secondary | ICD-10-CM

## 2013-10-28 DIAGNOSIS — S90222A Contusion of left lesser toe(s) with damage to nail, initial encounter: Secondary | ICD-10-CM

## 2013-10-28 DIAGNOSIS — L989 Disorder of the skin and subcutaneous tissue, unspecified: Secondary | ICD-10-CM

## 2013-10-28 DIAGNOSIS — R55 Syncope and collapse: Secondary | ICD-10-CM
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
## 2013-10-28 NOTE — Patient Instructions (Signed)
[facility]  [phone]

## 2013-10-29 ENCOUNTER — Encounter: Payer: Self-pay | Admitting: Primary Care

## 2013-10-29 DIAGNOSIS — E119 Type 2 diabetes mellitus without complications: Secondary | ICD-10-CM

## 2013-10-29 LAB — HEMOGLOBIN A1C: Hemoglobin A1C: 7 % — ABNORMAL HIGH (ref 4.0–6.0)

## 2013-10-31 ENCOUNTER — Encounter: Payer: Self-pay | Admitting: Primary Care

## 2013-11-01 ENCOUNTER — Encounter: Payer: Self-pay | Admitting: Psychiatry

## 2013-11-01 ENCOUNTER — Encounter: Payer: Self-pay | Admitting: Family Medicine

## 2013-11-01 ENCOUNTER — Ambulatory Visit: Payer: Self-pay | Admitting: Psychiatry

## 2013-11-01 ENCOUNTER — Encounter: Payer: Self-pay | Admitting: Gastroenterology

## 2013-11-01 ENCOUNTER — Other Ambulatory Visit: Payer: Self-pay | Admitting: Neurology

## 2013-11-01 ENCOUNTER — Other Ambulatory Visit: Payer: Self-pay | Admitting: Psychiatry

## 2013-11-01 ENCOUNTER — Ambulatory Visit: Payer: Self-pay | Admitting: Family Medicine

## 2013-11-01 VITALS — BP 124/84 | HR 88 | Temp 97.7°F | Ht 68.0 in | Wt 306.2 lb

## 2013-11-01 DIAGNOSIS — T300 Burn of unspecified body region, unspecified degree: Secondary | ICD-10-CM

## 2013-11-01 MED ORDER — CYCLOBENZAPRINE HCL 5 MG PO TABS *I*
5.0000 mg | ORAL_TABLET | Freq: Three times a day (TID) | ORAL | Status: DC | PRN
Start: 2013-11-01 — End: 2013-12-05

## 2013-11-01 MED ORDER — BUSPIRONE HCL 10 MG PO TABS *I*
10.0000 mg | ORAL_TABLET | Freq: Two times a day (BID) | ORAL | Status: DC
Start: 2013-11-01 — End: 2013-12-01

## 2013-11-01 MED ORDER — SILVER SULFADIAZINE 1 % EX CREA *I*
TOPICAL_CREAM | Freq: Every day | CUTANEOUS | Status: DC
Start: 2013-11-01 — End: 2013-12-01
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
## 2013-11-02 ENCOUNTER — Encounter: Payer: Self-pay | Admitting: Primary Care

## 2013-11-03 ENCOUNTER — Telehealth: Payer: Self-pay

## 2013-11-03 ENCOUNTER — Ambulatory Visit
Admit: 2013-11-03 | Discharge: 2013-12-03 | Disposition: A | Discharge status: Home / Self Care | Payer: Self-pay | Source: Ambulatory Visit | Attending: Psychiatry | Admitting: Psychiatry
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
## 2013-11-03 NOTE — Telephone Encounter (Signed)
Pt stated the entire leg is beginning  to swelling. Pt says when she press on her leg it leaves an indentation. At upper leg where it was noted an open area now is starting to blister. Blister at ankle is still  Intact. Has increased pain and redness but denies that leg feels warm. Please advise.
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
## 2013-11-08 ENCOUNTER — Ambulatory Visit: Payer: Self-pay | Admitting: Psychiatry

## 2013-11-08 ENCOUNTER — Ambulatory Visit: Payer: Self-pay | Admitting: Primary Care

## 2013-11-08 ENCOUNTER — Encounter: Payer: Self-pay | Admitting: Primary Care

## 2013-11-08 VITALS — BP 116/84 | HR 108 | Temp 98.2°F | Ht 68.0 in | Wt 307.4 lb

## 2013-11-08 DIAGNOSIS — IMO0002 Reserved for concepts with insufficient information to code with codable children: Secondary | ICD-10-CM
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
## 2013-11-09 ENCOUNTER — Encounter: Payer: Self-pay | Admitting: Gastroenterology

## 2013-11-11 ENCOUNTER — Ambulatory Visit: Payer: Self-pay

## 2013-11-11 NOTE — Progress Notes (Addendum)
Clinical Management Note     Pt stopped in requesting last visit note with [name] NP be sent to her cardiologist, Dr. Trilby Leaver. Release of information obtained and sent for scanning. Last note routed to Dr. Kerin Ransom. Pt also brought in new medication instructions from Dr. Kerin Ransom, pt stopped Theophylline and started Midodrine 5 mg twice a day, writer updated in med list.

## 2013-11-11 NOTE — Progress Notes (Signed)
Behavioral Health Progress Note     LENGTH OF SESSION: 60 minutes    Contact Type:  Location: On Site    Face to Face     Problem(s)/Goals Addressed from Treatment Plan:    Problem 1:   R PSY TP PROBLEM 10/19/2013   1ST TREATMENT PLAN PROBLEM Depression and anxiety       Goal for this problem:    R PSY TP GOAL 10/19/2013   1ST TREATMENT PLAN GOAL Learn DBT and PST skills to manage affect       Progress towards this goal: Problem resolving. Comment Patient reports on family relationships and her struggle with her boundaries.     Mental Status Exam:  APPEARANCE: Appears stated age, Well-groomed, Casual  ATTITUDE TOWARD INTERVIEWER: Cooperative  MOTOR ACTIVITY: WNL (within normal limits)  EYE CONTACT: Direct and Indirect  SPEECH: Normal rate and tone  AFFECT: Full Range  MOOD: Lively, Neutral and Sad  THOUGHT PROCESS: Normal  THOUGHT CONTENT: Negative Rumination  PERCEPTION: No evidence of hallucinations  ORIENTATION: Alert and Oriented X 3.  CONCENTRATION: Good  MEMORY:   Recent: intact   Remote: intact  COGNITIVE FUNCTION: Average intelligence  JUDGMENT: Intact  IMPULSE CONTROL: Good  INSIGHT: Fair    Risk Assessment:  ASSESSMENT OF RISK FOR SUICIDAL BEHAVIOR  Changes in risk for suicide from baseline Formulation of Risk and/or previous intake, including newly identified risk, if any: none  Violence risk was assessed and No Change noted from baseline formulation of risk and/or previous assessment.    Session Content/Interventions:  SESSION CONTENT:Patient reports on family relationships and her struggle with her boundaries. She said that Father's day she did not go to spouse's parents but went to her parents. Patient talked about her expectations for her brother and sister's behaviors and how they disappointed her due to not inviting her on a planned week's vacation. Writer and patient discussed patient pushing her siblings away by her actions rather than the desired effect of getting closer. Patient acknowledged her  behavior and identified skills that would help her manage this. Writer reviewed homework with patient.    INTERVENTIONS: Supportive counseling, DBT, interpersonal    Plan:  Psychotherapy continues as described in care plan; plan remains the same.    NEXT APPT: 11/25/13 @1p .m.      Tresa Res, LMSW

## 2013-11-13 ENCOUNTER — Encounter: Payer: Self-pay | Admitting: Primary Care

## 2013-11-15 ENCOUNTER — Other Ambulatory Visit: Payer: Self-pay

## 2013-11-15 ENCOUNTER — Other Ambulatory Visit: Payer: Self-pay | Admitting: Psychiatry

## 2013-11-15 MED ORDER — GLIMEPIRIDE 4 MG PO TABS *I*
4.0000 mg | ORAL_TABLET | Freq: Every day | ORAL | Status: DC
Start: 2013-11-15 — End: 2013-11-16

## 2013-11-15 MED ORDER — LURASIDONE HCL 60 MG PO TABS *A*
60.0000 mg | ORAL_TABLET | Freq: Every day | ORAL | Status: DC
Start: 2013-11-15 — End: 2013-12-01

## 2013-11-16 ENCOUNTER — Encounter: Payer: Self-pay | Admitting: Primary Care

## 2013-11-16 MED ORDER — GLIMEPIRIDE 4 MG PO TABS *I*
8.0000 mg | ORAL_TABLET | Freq: Every day | ORAL | Status: DC
Start: 2013-11-16 — End: 2014-01-21

## 2013-11-16 NOTE — Telephone Encounter (Signed)
Clarification of last amaryl prescription filled for pt. Per pharmacist, pt last received Amaryl 4 mg daily on 10/20/13.

## 2013-11-16 NOTE — Telephone Encounter (Signed)
Lorayne Marek, RN 11/16/2013 3:23 PM EDT        ----- Message -----   From: [name]   Sent: 11/16/2013 3:02 PM   To: Olevia Bowens Med Nurse  Subject: RE:A1c     I will need a new RX for the 2 in the AM. thanks     [name]    ----- Message -----  From: [name], MD  Sent: 10/29/13 12:45 PM  To: [name]  Subject: A1c    Hi Bethena Roys,    Your A1c went up slightly. It's now 7 (up from 6.6). Since you are working on exercise, you can try this for 3 months and we'll just repeat it. But if you want to take two of your amaryl (the medicine for your diabetes) that's fine too. I would have you take both at the same time, in the morning. It lowers your sugar hence why it's important to always take it in the morning, and never at night.    [name], MD  [facility]  10/29/2013  12:45 PM

## 2013-11-17 ENCOUNTER — Encounter: Payer: Self-pay | Admitting: Gastroenterology

## 2013-11-19 ENCOUNTER — Ambulatory Visit: Payer: Self-pay | Admitting: Dentist

## 2013-11-22 ENCOUNTER — Encounter: Payer: Self-pay | Admitting: Psychiatry

## 2013-11-22 ENCOUNTER — Telehealth: Payer: Self-pay

## 2013-11-22 NOTE — Telephone Encounter (Signed)
Wylie Hail is a PA from [place] [phone]) is calling back to speak with someone in regards to [facility], please call back

## 2013-11-22 NOTE — Telephone Encounter (Signed)
Returned call to [place] PA at [facility], she was looking to do a 'handoff' as pt is being discharged today. She states that pt was admitted 11/17/13 with diverticulitis was on IV ABX and has been switched over to CIPRO and Flagyl and pt still has 4 more days to finish treatment. She is on a Full Liquid diet- AAT but is advancing at her own discretion. Pt has an appt already scheduled with PCP on 11/23/13 and pt is to follow up with surgeon Dr. Tresa Res.

## 2013-11-22 NOTE — Telephone Encounter (Signed)
[facility] called. The patient is scheduled for a hospital f/u for tomorrow 11/23/13 with Dr. Laurance Flatten

## 2013-11-23 ENCOUNTER — Ambulatory Visit: Payer: Self-pay | Admitting: Primary Care

## 2013-11-23 ENCOUNTER — Encounter: Payer: Self-pay | Admitting: Primary Care

## 2013-11-23 ENCOUNTER — Telehealth: Payer: Self-pay

## 2013-11-23 NOTE — Telephone Encounter (Signed)
Call to pt as she did not show up for her appt that was schedule for her discharge from hospital visit, no answer,left name and number for call back.

## 2013-11-24 ENCOUNTER — Ambulatory Visit: Payer: Self-pay | Admitting: Psychiatry

## 2013-11-24 ENCOUNTER — Encounter: Payer: Self-pay | Admitting: Primary Care

## 2013-11-24 ENCOUNTER — Ambulatory Visit: Payer: Self-pay | Admitting: Primary Care

## 2013-11-24 VITALS — BP 126/80 | HR 90 | Temp 97.1°F | Wt 298.6 lb

## 2013-11-24 DIAGNOSIS — K5792 Diverticulitis of intestine, part unspecified, without perforation or abscess without bleeding: Secondary | ICD-10-CM

## 2013-11-24 NOTE — Progress Notes (Signed)
[facility]    SUBJECTIVE    Pt is here to discuss:    Chief Complaint   Patient presents with    Follow-up     Inpatient follow up appt     1. Fu diverticulitis - hospitalized at [facility] 7/15 - 7/20 for DVitis, required IV antibiotics. Discharged Monday with 19more days of Cipro/Flagyl. Tells me today she is still having L sided upper and lower abdominal pain since discharge on Monday, but this has improved compared to her pain on admission. Initially 9/10 in discomfort, more on R side. It transitioned to the L while she was hospitalized, and has improved to a 6/10. Using narcotic medication TID with some relief. Denies constipation or diarrhea. Eating bland diet (white bread, sandwich). Denies n/v/fevers. Asking about urine results while hospitalized b/c she had noticed dark urine, denies any dysuria or hematuria and it has since improved.    PMH / Family Hx / Social Hx  Patient's medications, allergies, problem list, past medical, social histories were reviewed and notable for:    Current Outpatient Prescriptions   Medication Sig Note    glimepiride (AMARYL) 4 MG tablet Take 2 tablets (8 mg total) by mouth daily (with breakfast)     lurasidone (LATUDA) 60 MG tablet Take 1 tablet (60 mg total) by mouth daily     midodrine (PROAMATINE) 5 MG tablet Take 5 mg by mouth 2 times daily     cyclobenzaprine (FLEXERIL) 5 MG tablet Take 1 tablet (5 mg total) by mouth 3 times daily as needed for Muscle spasms     busPIRone (BUSPAR) 10 MG tablet Take 1 tablet (10 mg total) by mouth 2 times daily     silver sulfADIAZINE (SILVADENE) 1 % cream Apply topically daily   Apply with gloves.     SUMAtriptan Succinate 6 MG/0.5ML SOAJ  10/04/2013: Received from: External Pharmacy    DULoxetine (CYMBALTA) 60 MG capsule Take 1 capsule (60 mg total) by mouth daily     mirtazapine (REMERON) 7.5 MG tablet Take 1 tablet (7.5 mg total) by mouth nightly     gabapentin (NEURONTIN) 600 MG tablet TAKE 1 TABLET (600 MG TOTAL)  BY MOUTH 3 TIMES DAILY *30 DAYS PER INSURANCE*     VENTOLIN HFA 108 (90 BASE) MCG/ACT inhaler Inhale 1-2 puffs into the lungs every 4-6 hours as needed for Wheezing     dicyclomine (BENTYL) 20 MG tablet  08/18/2013: Received from: External Pharmacy    SUMAtriptan (IMITREX) 50 MG tablet Take 1 tablet (50 mg total) by mouth as needed for Migraine   Take at onset of headache. May repeat once in 2 hours.     omeprazole (PRILOSEC) 20 MG capsule Take 1 capsule (20 mg total) by mouth daily (before breakfast)     topiramate (TOPAMAX) 50 MG tablet Take 1 tablet (50 mg total) by mouth 2 times daily     lancets Brand Free Style Lite; Use 2 times per day as directed for blood glucose testing.     FREESTYLE LITE test strip Use BID as directed for 250.02     Alcohol Swabs (ALCOHOL WIPES) PADS Use BID for BG check     naproxen sodium (ANAPROX) 550 MG tablet Take 1 tablet (550 mg total) by mouth 2 times daily (with meals)     Melatonin 5 MG CAPS Take 10 mg by mouth nightly     famciclovir (FAMVIR) 500 MG tablet Take 1 tablet (500 mg total) by mouth 3 times daily  as needed     blood glucose monitor system Brand: cheapest brand available per her insurance.  Use as directed.     fluticasone (FLONASE) 50 MCG/ACT nasal spray 1 spray by Nasal route daily     atorvastatin (LIPITOR) 40 MG tablet Take 40 mg by mouth daily (with dinner)     ranolazine (RANEXA) 500 MG 12 hr tablet Take 500 mg by mouth 2 times daily   Swallow whole. Do not crush, break, or chew.     Non-System Medication The above patient is followed in our clinic and cannot resume work permanently.     ondansetron (ZOFRAN) 8 MG tablet Take 1 tablet (8 mg total) by mouth 3 times daily as needed for Nausea      Cipro  Flagyl  Percocet    ROS  Per HPI      OBJECTIVE  Filed Vitals:    11/24/13 1053   BP: 126/80   Pulse: 90   Temp: 36.2 C (97.1 F)   TempSrc: Temporal   Weight: 135.444 kg (298 lb 9.6 oz)     Body mass index is 45.41 kg/(m^2).      General:  well-appearing obese Caucasian female, pleasant & conversant, in NAD, speaking comfortably, smiling. Here with wife [name] who watches carefully over pt, extending her arm to help her to/from exam table.   Eyes:. PERRLA. EOMI. Conjunctiva pink, without swelling or exudate. Lids normal.   ENT: Moist mucous membranes.   Cardiac: RRR, no M/R/G. No pedal edema. Pulses 2+ b/l.     Abd: S/mildly TTP in LUQ and LLQ but appears exagerrated tenderness. No guarding or rebound. Normoactive BS.  No masses or organomegaly.   Psych: AAOx3, normal affect and mood. Insight and judgement intact. Seems to be trying to please [place]- "I made my own MAR", smiling proudly.       ASSESSMENT & PLAN  1. Diverticulitis  Improving gradually per pt. Reviewed warning signs of recurrent or worsening infection or abscess (recurrent fevers, acute increase in pain, diarrhea, decreased PO). Call if these develop.  Also discussed that with her referral to Dr. Tresa Res to discuss surgical management, as this is her fifth exacerbation in the last 3 years.      Follow-up: as scheduled re; other chronic issues      [name], MD  [facility]  11/24/2013  10:59 AM        ______________________

## 2013-11-25 ENCOUNTER — Encounter: Payer: Self-pay | Admitting: Dermatology

## 2013-11-25 ENCOUNTER — Ambulatory Visit: Payer: Self-pay

## 2013-11-25 ENCOUNTER — Ambulatory Visit: Payer: Self-pay | Admitting: Dermatology

## 2013-11-25 VITALS — BP 126/82 | Ht 68.0 in | Wt 298.0 lb

## 2013-11-25 DIAGNOSIS — D229 Melanocytic nevi, unspecified: Secondary | ICD-10-CM

## 2013-11-25 NOTE — Progress Notes (Addendum)
Consulting MD: [name], MD (General)    CC: dark spot under her right big toe  HPI: Pt is a 38 y.o.y/o caucasian female who presents for the first time to dermatology clinic with the above complaints.  About a year ago she noticed a dark spot under her right big toe. She reports that she does not recall any trauma and has noticed only a small change in its appearance. She denies pain or itching at the site. She also has a mole on the left chin and right nostril that she would like assessed. Patient denies personal or family history of skin cancer but reports that her grandmother had breast cancer.  ROS: Pt is otherwise in normal state of health    Allergies:  Allergies   Allergen Reactions    Morphine Anaphylaxis    Trazodone Anaphylaxis       PMH:   Patient Active Problem List   Diagnosis Code    DM (diabetes mellitus), type 2, uncontrolled 250.02    Migraine with aura 346.00    Asthma 493.90    Hyperlipidemia 272.4    Fibromyalgia 729.1    Neurocardiogenic syncope 780.2    Major depression, recurrent 296.30    IUD (intrauterine device) in place V45.51    Skin lesion of face 709.9    IBS (irritable bowel syndrome) 564.1    Onychomycosis 110.1    Anxiety 300.00    OCD (obsessive compulsive disorder) 300.3    Carpal tunnel syndrome 354.0    Meralgia paresthetica of left side 355.1       FH:  Family History   Problem Relation Age of Onset    Hypertension Father     Diabetes Father     Kidney disease Father     Elevated lipids Father     Heart attack Father 28    Hypertension Mother     Elevated lipids Mother     Heart attack Mother 68    Diabetes Mother     Eczema Mother     Psoriasis Mother     Hypertension Brother     Heart disease Brother 57     prinzmetal's angina    Heart disease Sister      currently having work up    Fainting Sister     Hypertension Sister     Breast cancer Maternal Grandmother     Stroke         SocH:  History     Social History    Marital Status:  Married     Spouse Name: N/A     Number of Children: N/A    Years of Education: N/A     Occupational History    Not on file.     Social History Main Topics    Smoking status: Former Smoker -- 0.50 packs/day for 3 years     Types: Cigarettes    Smokeless tobacco: Never Used    Alcohol Use: Yes      Comment: <1 weekly    Drug Use: No    Sexual Activity:     Partners: Female     Other Topics Concern    Not on file     Social History Narrative    Married to [place], recently relocated back to [place] from [place] due to family stressors. 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          PE  Filed Vitals:  11/25/13 1133   BP: 126/82   Height: 1.727 m (5\' 8" )   Weight: 135.172 kg (298 lb)     General: Awake and alert   -Face/Neck/Scalp: on the left aspect of her chin is a flesh colored 0.5cm papule, on the right nostril is a 0.3cm flesh-colored papuple   -Nails/Hair: underneath the right big toe is a 1.65mm purple macule, 6 mm from cuticle, heme globules with episcope.  Normal nail plate without dystrophy.    Barriers to learning: None    Assessment/Plan:  1. Subungual hematoma  --the diagnosis and treatment plan were discussed with the patient  --measured distance of hematoma from nail bed (0.6 cm).  Advised patient to continue to observe for it to grow with nail and advise [place] if enlarges.      2. Nevus (left chin and upper right nostril)  --the diagnosis was discussed with the patient  --ABCDEs of mole exam discussed with patient and added to AVS    --The patient was seen and examined by myself and  Dr Thurmon Fair, MD    I saw and evaluated the patient with resident. I agree with the resident's findings and plan of care as documented above.   Doreene Nest, MD

## 2013-11-25 NOTE — Progress Notes (Signed)
Behavioral Health Progress Note     LENGTH OF SESSION: 50 minutes    Contact Type:  Location: On Site    Face to Face     Problem(s)/Goals Addressed from Treatment Plan:    Problem 1:   R PSY TP PROBLEM 10/19/2013   1ST TREATMENT PLAN PROBLEM Depression and anxiety       Goal for this problem:    R PSY TP GOAL 10/19/2013   1ST TREATMENT PLAN GOAL Learn DBT and PST skills to manage affect       Progress towards this goal:  Patient reports another hospitalization for medical issues.    Mental Status Exam:  APPEARANCE: Appears stated age, Well-groomed  ATTITUDE TOWARD INTERVIEWER: Cooperative  MOTOR ACTIVITY: WNL (within normal limits)  EYE CONTACT: Direct and Indirect  SPEECH: Normal rate and tone  AFFECT: Full Range  MOOD: Irritable, Lively and Sad  THOUGHT PROCESS: Concrete  THOUGHT CONTENT: Negative Rumination  PERCEPTION: No evidence of hallucinations  ORIENTATION: Alert and Oriented X 3.  CONCENTRATION: WNL  MEMORY:   Recent: intact   Remote: intact  COGNITIVE FUNCTION: Average intelligence  JUDGMENT: Intact  IMPULSE CONTROL: Fair  INSIGHT: Fair    Risk Assessment:  ASSESSMENT OF RISK FOR SUICIDAL BEHAVIOR  Changes in risk for suicide from baseline Formulation of Risk and/or previous intake, including newly identified risk, if any: none  Violence risk was assessed and No Change noted from baseline formulation of risk and/or previous assessment.    Session Content::  Patient reports another hospitalization for medical issues. She said that she had acute diverticulitis. Patient told Probation officer that she had done any homework during this time. Writer reinforced the need for skills during increased psychosocial stressors. Writer reviewed Model of Emotion with patient. Patient talked about her sadness over her brother not visiting her in the hospital. Patient told writer about events in [place] when her SIL died and how she was at her brother's side the whole time. Patient expressed her sadness over the distance in their  relationship and with her nieces. Writer discussed appropriate boundaries within relationships and how establishing these can reduce some of patient's distress.     Interventions:    Dialectical Behavioral Therapy skills (specifiy skills used):  Model of Emotion    Plan:  Psychotherapy continues as described in care plan; plan remains the same.    NEXT APPT: 12/09/13 @1p .m.      Tresa Res, LMSW

## 2013-11-25 NOTE — Patient Instructions (Addendum)
The lesion on your left big toe is 0.5 cm from the cuticle. Continue to monitor and measure its distance. If you are concerned by it's growth or size please contact us for another appointment.     Please continue to use sunscreen [id number]+. Avoid excess sun exposure and/o use protective clothing.    Monitor moles for the following changes:     A: asymmetry  B: border changes  C: color changes   D: diameter greater than a pencil eraser size   E: evolving, or changing mole       Follow up in one year for full skin check.

## 2013-11-26 ENCOUNTER — Encounter: Payer: Self-pay | Admitting: Primary Care

## 2013-11-26 MED ORDER — OXYCODONE-ACETAMINOPHEN 5-325 MG PO TABS *I*
1.0000 | ORAL_TABLET | Freq: Four times a day (QID) | ORAL | Status: DC | PRN
Start: 2013-11-26 — End: 2014-05-02

## 2013-11-29 ENCOUNTER — Encounter: Payer: Self-pay | Admitting: Gastroenterology

## 2013-12-01 ENCOUNTER — Ambulatory Visit: Payer: Self-pay | Admitting: Psychiatry

## 2013-12-01 VITALS — BP 123/89 | HR 100 | Ht 68.11 in | Wt 298.0 lb

## 2013-12-01 DIAGNOSIS — F429 Obsessive-compulsive disorder, unspecified: Secondary | ICD-10-CM

## 2013-12-01 DIAGNOSIS — F609 Personality disorder, unspecified: Secondary | ICD-10-CM

## 2013-12-01 DIAGNOSIS — F419 Anxiety disorder, unspecified: Secondary | ICD-10-CM

## 2013-12-01 DIAGNOSIS — F339 Major depressive disorder, recurrent, unspecified: Secondary | ICD-10-CM

## 2013-12-01 MED ORDER — BUSPIRONE HCL 15 MG PO TABS *I*
15.0000 mg | ORAL_TABLET | Freq: Two times a day (BID) | ORAL | Status: DC
Start: 2013-12-01 — End: 2013-12-28

## 2013-12-01 MED ORDER — LURASIDONE HCL 60 MG PO TABS *A*
60.0000 mg | ORAL_TABLET | Freq: Every day | ORAL | Status: DC
Start: 2013-12-01 — End: 2014-02-02
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
## 2013-12-01 NOTE — Patient Instructions (Signed)
Restart skills group on 8/31 and please come at 5 PM so you can complete the assessment.

## 2013-12-02 ENCOUNTER — Ambulatory Visit: Payer: Self-pay | Admitting: Student in an Organized Health Care Education/Training Program

## 2013-12-02 DIAGNOSIS — M26609 Unspecified temporomandibular joint disorder, unspecified side: Secondary | ICD-10-CM

## 2013-12-02 MED ORDER — DIAZEPAM 5 MG PO TABS *I*
5.0000 mg | ORAL_TABLET | Freq: Two times a day (BID) | ORAL | Status: DC | PRN
Start: 2013-12-02 — End: 2014-01-27

## 2013-12-02 MED ORDER — DIAZEPAM 5 MG PO TABS *I*
5.0000 mg | ORAL_TABLET | Freq: Two times a day (BID) | ORAL | Status: DC | PRN
Start: 2013-12-02 — End: 2013-12-02

## 2013-12-02 NOTE — Progress Notes (Signed)
Oral and Maxillofacial Surgery  Consult Note      Chief Complaint   Patient presents with    Jaw Pain     TMJ consult        Past Medical History   Diagnosis Date    Diabetes mellitus      Previously on SU and metformin, now diet controlled    Asthma     Allergy history unknown     GERD (gastroesophageal reflux disease)     Dysfunctional uterine bleeding     Diverticulitis 09/2010    Sebaceous cyst of breast      right axilla    Anginal pain     Hyperlipidemia     Varicella     Complication of anesthesia     Arthritis        Past Surgical History   Procedure Laterality Date    Dilation and curettage of uterus  2005, 2007     x2 for menorraghia    Tonsillectomy      Cardiac catheterization  09/2011     negative    Loop recorder  Feb 03 2014    Arthroscopic shoulder surgery Right 2010     Related to lifting       Prior to Admission medications    Medication Sig Start Date End Date Taking? Authorizing Provider   diazepam (VALIUM) 5 MG tablet Take 1 tablet (5 mg total) by mouth 2 times daily as needed for Anxiety   Max daily dose: 10 mg 12/02/13   Floy Sabina, DMD   busPIRone (BUSPAR) 15 MG tablet Take 1 tablet (15 mg total) by mouth 2 times daily 12/01/13   Corigliano, Suann Larry, NP   lurasidone (LATUDA) 60 MG tablet Take 1 tablet (60 mg total) by mouth daily 12/01/13   Corigliano, Suann Larry, NP   oxyCODONE-acetaminophen (PERCOCET) 5-325 MG per tablet Take 1 tablet by mouth every 6 hours as needed for Pain   Max daily dose: 4 tablets 11/26/13   [name], MD   glimepiride (AMARYL) 4 MG tablet Take 2 tablets (8 mg total) by mouth daily (with breakfast) 11/16/13   Thelma Comp), MD   midodrine (PROAMATINE) 5 MG tablet Take 5 mg by mouth 2 times daily    Trilby Leaver   cyclobenzaprine (FLEXERIL) 5 MG tablet Take 1 tablet (5 mg total) by mouth 3 times daily as needed for Muscle spasms 11/01/13   Lance Bosch, NP   SUMAtriptan Succinate 6 MG/0.5ML SOAJ  08/13/13   [provider]    DULoxetine (CYMBALTA) 60 MG capsule Take 1 capsule (60 mg total) by mouth daily 10/04/13   Corigliano, Suann Larry, NP   mirtazapine (REMERON) 7.5 MG tablet Take 1 tablet (7.5 mg total) by mouth nightly 10/04/13   Corigliano, [name] D, NP   gabapentin (NEURONTIN) 600 MG tablet TAKE 1 TABLET (600 MG TOTAL) BY MOUTH 3 TIMES DAILY *30 DAYS PER INSURANCE* 09/24/13   Lance Bosch, NP   VENTOLIN HFA 108 (90 BASE) MCG/ACT inhaler Inhale 1-2 puffs into the lungs every 4-6 hours as needed for Wheezing 09/03/13   [name], MD   dicyclomine (BENTYL) 20 MG tablet  08/04/13   [provider]   SUMAtriptan (IMITREX) 50 MG tablet Take 1 tablet (50 mg total) by mouth as needed for Migraine   Take at onset of headache. May repeat once in 2 hours. 08/11/13   Smithlightfoot, Jana Half, NP   omeprazole (PRILOSEC) 20 MG  capsule Take 1 capsule (20 mg total) by mouth daily (before breakfast) 08/09/13   [name], MD   topiramate (TOPAMAX) 50 MG tablet Take 1 tablet (50 mg total) by mouth 2 times daily 08/04/13   Smithlightfoot, Jana Half, NP   lancets Brand Free Style Lite; Use 2 times per day as directed for blood glucose testing. 08/04/13   [name], MD   FREESTYLE LITE test strip Use BID as directed for 250.02 08/04/13   [name], MD   Alcohol Swabs (ALCOHOL WIPES) PADS Use BID for BG check 08/04/13   [name], MD   naproxen sodium (ANAPROX) 550 MG tablet Take 1 tablet (550 mg total) by mouth 2 times daily (with meals) 08/04/13   [name], MD   Melatonin 5 MG CAPS Take 10 mg by mouth nightly 08/02/13   Corigliano, Suann Larry, NP   famciclovir (FAMVIR) 500 MG tablet Take 1 tablet (500 mg total) by mouth 3 times daily as needed 06/22/13   [name], MD   blood glucose monitor system Brand: cheapest brand available per her insurance.  Use as directed. 04/26/13   [name], MD   fluticasone Asencion Islam) 50 MCG/ACT nasal spray 1 spray by Nasal route daily 04/26/13   [name], MD   atorvastatin (LIPITOR) 40 MG tablet Take  40 mg by mouth daily (with dinner)    [provider]   ranolazine (RANEXA) 500 MG 12 hr tablet Take 500 mg by mouth 2 times daily   Swallow whole. Do not crush, break, or chew.    [provider]   Non-System Medication The above patient is followed in our clinic and cannot resume work permanently. 02/15/13   Lance Bosch, NP   ondansetron (ZOFRAN) 8 MG tablet Take 1 tablet (8 mg total) by mouth 3 times daily as needed for Nausea 01/18/13   Smithlightfoot, Jana Half, NP          Current Outpatient Prescriptions   Medication    diazepam (VALIUM) 5 MG tablet    busPIRone (BUSPAR) 15 MG tablet    lurasidone (LATUDA) 60 MG tablet    oxyCODONE-acetaminophen (PERCOCET) 5-325 MG per tablet    glimepiride (AMARYL) 4 MG tablet    midodrine (PROAMATINE) 5 MG tablet    cyclobenzaprine (FLEXERIL) 5 MG tablet    SUMAtriptan Succinate 6 MG/0.5ML SOAJ    DULoxetine (CYMBALTA) 60 MG capsule    mirtazapine (REMERON) 7.5 MG tablet    gabapentin (NEURONTIN) 600 MG tablet    VENTOLIN HFA 108 (90 BASE) MCG/ACT inhaler    dicyclomine (BENTYL) 20 MG tablet    SUMAtriptan (IMITREX) 50 MG tablet    omeprazole (PRILOSEC) 20 MG capsule    topiramate (TOPAMAX) 50 MG tablet    lancets    FREESTYLE LITE test strip    Alcohol Swabs (ALCOHOL WIPES) PADS    naproxen sodium (ANAPROX) 550 MG tablet    Melatonin 5 MG CAPS    famciclovir (FAMVIR) 500 MG tablet    blood glucose monitor system    fluticasone (FLONASE) 50 MCG/ACT nasal spray    atorvastatin (LIPITOR) 40 MG tablet    ranolazine (RANEXA) 500 MG 12 hr tablet    Non-System Medication    ondansetron (ZOFRAN) 8 MG tablet     No current facility-administered medications for this visit.          Allergies:  Morphine and Trazodone    Social:  reports that she has quit smoking. Her smoking use included  Cigarettes. She has a 1.5 pack-year smoking history. She has never used smokeless tobacco. She reports that she drinks alcohol. She reports that she does  not use illicit drugs.     Family Hx:  Family History   Problem Relation Age of Onset    Hypertension Father     Diabetes Father     Kidney disease Father     Elevated lipids Father     Heart attack Father 83    Hypertension Mother     Elevated lipids Mother     Heart attack Mother 40    Diabetes Mother     Eczema Mother     Psoriasis Mother     Hypertension Brother     Heart disease Brother 43     prinzmetal's angina    Heart disease Sister      currently having work up    [place] Sister     Hypertension Sister     Breast cancer Maternal Grandmother     Stroke         VS: There were no vitals taken for this visit.    Referral Source: Dr Cecil Cobbs    Problem/Concerns:   HPI: Pt has heard popping in both joints for ~ 10 years now. 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  Left TMJ makes a lot of "popping/cracking" but is not painful.  No therapy has been initiated with her general dentist to date.     Objective:              Extraoral Exam: No LAD, No TTP, TMJ painful on R upon opening, B/L click with reduction.  Opening: 59mm, protrusion 47mm, R/L excursion 63mm    Consult with Dr Marinus Maw    Assessment: 38 y.o. female with MHx significant for:  Diabetes mellitus     Asthma     Allergy history unknown     GERD (gastroesophageal reflux disease)     Dysfunctional uterine bleeding     Diverticulitis 09/2010    Sebaceous cyst of breast     Anginal pain     Hyperlipidemia     Varicella     Complication of anesthesia     Arthritis        with long standing history of TMJ discomfort which has worsened recently on the R side and she describes as locking open when she opens too wide. She will undergo an MRI so we are able to evalute the disc and joint spaces.  Pt is claustrophobic so Valium was prescribed for the MRI.    Plan:   TMJ MRI    All questions answered and pt left clinic in good condition.    Floy Sabina    [facility] Resident

## 2013-12-04 ENCOUNTER — Ambulatory Visit
Admit: 2013-12-04 | Discharge: 2014-01-03 | Disposition: A | Discharge status: Home / Self Care | Payer: Self-pay | Source: Ambulatory Visit | Attending: Psychiatry | Admitting: Psychiatry

## 2013-12-05 ENCOUNTER — Other Ambulatory Visit: Payer: Self-pay | Admitting: Primary Care

## 2013-12-09 ENCOUNTER — Ambulatory Visit: Payer: Self-pay

## 2013-12-10 ENCOUNTER — Other Ambulatory Visit: Payer: Self-pay | Admitting: Primary Care

## 2013-12-10 ENCOUNTER — Encounter: Payer: Self-pay | Admitting: Gastroenterology

## 2013-12-13 ENCOUNTER — Other Ambulatory Visit: Payer: Self-pay | Admitting: Psychiatry

## 2013-12-13 ENCOUNTER — Other Ambulatory Visit: Payer: Self-pay | Admitting: Primary Care

## 2013-12-13 ENCOUNTER — Telehealth: Payer: Self-pay

## 2013-12-13 ENCOUNTER — Other Ambulatory Visit: Payer: Self-pay | Admitting: Nurse Practitioner

## 2013-12-13 MED ORDER — SUMATRIPTAN SUCCINATE REFILL 6 MG/0.5ML SC SOLN *A*
6.0000 mg | Freq: Once | SUBCUTANEOUS | Status: AC | PRN
Start: 2013-12-13 — End: 2013-12-13

## 2013-12-13 MED ORDER — MIRTAZAPINE 7.5 MG PO TABS *A*
7.5000 mg | ORAL_TABLET | Freq: Every evening | ORAL | Status: DC
Start: 2013-12-13 — End: 2014-03-29

## 2013-12-13 MED ORDER — FLUTICASONE PROPIONATE 50 MCG/ACT NA SUSP *I*
1.0000 | Freq: Every day | NASAL | Status: DC
Start: 2013-12-13 — End: 2014-07-27

## 2013-12-13 NOTE — Telephone Encounter (Signed)
Omeprazole 20 mg requires a PA. LM , I am asking that pt calls her insurance company and ask for the alterative medications that are covered by her plan.

## 2013-12-14 ENCOUNTER — Encounter: Payer: Self-pay | Admitting: Primary Care

## 2013-12-14 ENCOUNTER — Ambulatory Visit
Admit: 2013-12-14 | Discharge: 2013-12-14 | Disposition: A | Discharge status: Home / Self Care | Payer: Self-pay | Source: Ambulatory Visit | Attending: Oral Surgery | Admitting: Oral Surgery

## 2013-12-14 MED ORDER — SUMATRIPTAN SUCCINATE 50 MG PO TABS *I*
50.0000 mg | ORAL_TABLET | ORAL | Status: DC | PRN
Start: 2013-12-14 — End: 2015-11-08

## 2013-12-14 MED ORDER — GABAPENTIN 800 MG PO TABS
800.0000 mg | ORAL_TABLET | Freq: Three times a day (TID) | ORAL | Status: DC
Start: 2013-12-14 — End: 2014-03-07

## 2013-12-14 NOTE — Telephone Encounter (Signed)
Pt contacted. She isn't being seen by [facility] anymore. She has since been referred to another Neurologist and has yet to be seen by them. Dr Laurance Flatten, are you willing to change/increase gabapentin until seen by Neuro?

## 2013-12-14 NOTE — Telephone Encounter (Signed)
Pt states she will probably just buy it over the counter for a month. Pt states she is switching back to MVP so that this will not be a issue with her medication any more.

## 2013-12-21 ENCOUNTER — Ambulatory Visit: Payer: Self-pay | Admitting: Student in an Organized Health Care Education/Training Program

## 2013-12-21 DIAGNOSIS — M26609 Unspecified temporomandibular joint disorder, unspecified side: Secondary | ICD-10-CM

## 2013-12-21 NOTE — Progress Notes (Signed)
Oral and Maxillofacial Surgery   Progress Note      Chief Complaint   Patient presents with    Follow-up     TMJ       Past Medical History   Diagnosis Date    Diabetes mellitus      Previously on SU and metformin, now diet controlled    Asthma     Allergy history unknown     GERD (gastroesophageal reflux disease)     Dysfunctional uterine bleeding     Diverticulitis 09/2010    Sebaceous cyst of breast      right axilla    Anginal pain     Hyperlipidemia     Varicella     Complication of anesthesia     Arthritis        Past Surgical History   Procedure Laterality Date    Dilation and curettage of uterus  2005, 2007     x2 for menorraghia    Tonsillectomy      Cardiac catheterization  09/2011     negative    Loop recorder  Feb 03 2014    Arthroscopic shoulder surgery Right 2010     Related to lifting       Prior to Admission medications    Medication Sig Start Date End Date Taking? Authorizing Provider   SUMAtriptan (IMITREX) 50 MG tablet Take 1 tablet (50 mg total) by mouth as needed for Migraine   Take at onset of headache. May repeat once in 2 hours. 12/14/13   Lance Bosch, NP   gabapentin (NEURONTIN) 800 MG tablet Take 1 tablet (800 mg total) by mouth 3 times daily 12/14/13   [name], MD   omeprazole (PRILOSEC) 20 MG capsule TAKE 1 CAPSULE (20 MG TOTAL) BY MOUTH DAILY (BEFORE BREAKFAST) 12/13/13   [name], MD   mirtazapine (REMERON) 7.5 MG tablet Take 1 tablet (7.5 mg total) by mouth nightly 12/13/13   Corigliano, Suann Larry, NP   fluticasone (FLONASE) 50 MCG/ACT nasal spray 1 spray by Nasal route daily 12/13/13   [name], MD   cyclobenzaprine (FLEXERIL) 5 MG tablet TAKE 1 TABLET (5 MG TOTAL) BY MOUTH 3 TIMES DAILY AS NEEDED FOR MUSCLE SPASMS 12/06/13   Lance Bosch, NP   diazepam (VALIUM) 5 MG tablet Take 1 tablet (5 mg total) by mouth 2 times daily as needed for Anxiety   Max daily dose: 10 mg 12/02/13   Floy Sabina, DMD   busPIRone (BUSPAR) 15 MG tablet Take 1 tablet (15  mg total) by mouth 2 times daily 12/01/13   Corigliano, Suann Larry, NP   lurasidone (LATUDA) 60 MG tablet Take 1 tablet (60 mg total) by mouth daily 12/01/13   Corigliano, Suann Larry, NP   oxyCODONE-acetaminophen (PERCOCET) 5-325 MG per tablet Take 1 tablet by mouth every 6 hours as needed for Pain   Max daily dose: 4 tablets 11/26/13   [name], MD   glimepiride (AMARYL) 4 MG tablet Take 2 tablets (8 mg total) by mouth daily (with breakfast) 11/16/13   Thelma Comp), MD   midodrine (PROAMATINE) 5 MG tablet Take 5 mg by mouth 2 times daily    Trilby Leaver   DULoxetine (CYMBALTA) 60 MG capsule Take 1 capsule (60 mg total) by mouth daily 10/04/13   Corigliano, Suann Larry, NP   VENTOLIN HFA 108 (90 BASE) MCG/ACT inhaler Inhale 1-2 puffs into the lungs every 4-6 hours as needed for Wheezing 09/03/13  [name], MD   dicyclomine (BENTYL) 20 MG tablet  08/04/13   [provider]   topiramate (TOPAMAX) 50 MG tablet Take 1 tablet (50 mg total) by mouth 2 times daily 08/04/13   Smithlightfoot, Jana Half, NP   lancets Brand Free Style Lite; Use 2 times per day as directed for blood glucose testing. 08/04/13   [name], MD   FREESTYLE LITE test strip Use BID as directed for 250.02 08/04/13   [name], MD   Alcohol Swabs (ALCOHOL WIPES) PADS Use BID for BG check 08/04/13   [name], MD   naproxen sodium (ANAPROX) 550 MG tablet Take 1 tablet (550 mg total) by mouth 2 times daily (with meals) 08/04/13   [name], MD   Melatonin 5 MG CAPS Take 10 mg by mouth nightly 08/02/13   Corigliano, Suann Larry, NP   famciclovir (FAMVIR) 500 MG tablet Take 1 tablet (500 mg total) by mouth 3 times daily as needed 06/22/13   [name], MD   blood glucose monitor system Brand: cheapest brand available per her insurance.  Use as directed. 04/26/13   [name], MD   atorvastatin (LIPITOR) 40 MG tablet Take 40 mg by mouth daily (with dinner)    [provider]   ranolazine (RANEXA) 500 MG 12 hr tablet Take  500 mg by mouth 2 times daily   Swallow whole. Do not crush, break, or chew.    [provider]   Non-System Medication The above patient is followed in our clinic and cannot resume work permanently. 02/15/13   Lance Bosch, NP   ondansetron (ZOFRAN) 8 MG tablet Take 1 tablet (8 mg total) by mouth 3 times daily as needed for Nausea 01/18/13   Smithlightfoot, Jana Half, NP          Current Outpatient Prescriptions   Medication    SUMAtriptan (IMITREX) 50 MG tablet    gabapentin (NEURONTIN) 800 MG tablet    omeprazole (PRILOSEC) 20 MG capsule    mirtazapine (REMERON) 7.5 MG tablet    fluticasone (FLONASE) 50 MCG/ACT nasal spray    cyclobenzaprine (FLEXERIL) 5 MG tablet    diazepam (VALIUM) 5 MG tablet    busPIRone (BUSPAR) 15 MG tablet    lurasidone (LATUDA) 60 MG tablet    oxyCODONE-acetaminophen (PERCOCET) 5-325 MG per tablet    glimepiride (AMARYL) 4 MG tablet    midodrine (PROAMATINE) 5 MG tablet    DULoxetine (CYMBALTA) 60 MG capsule    VENTOLIN HFA 108 (90 BASE) MCG/ACT inhaler    dicyclomine (BENTYL) 20 MG tablet    topiramate (TOPAMAX) 50 MG tablet    lancets    FREESTYLE LITE test strip    Alcohol Swabs (ALCOHOL WIPES) PADS    naproxen sodium (ANAPROX) 550 MG tablet    Melatonin 5 MG CAPS    famciclovir (FAMVIR) 500 MG tablet    blood glucose monitor system    atorvastatin (LIPITOR) 40 MG tablet    ranolazine (RANEXA) 500 MG 12 hr tablet    Non-System Medication    ondansetron (ZOFRAN) 8 MG tablet     No current facility-administered medications for this visit.          Allergies:  Morphine and Trazodone    S:   Pt presents to discuss results of MRI. Stable since last visit with no improvement or worsening of symptoms - jaw has locked once since last appointment while eating, and she was able to reduce using massage of the  area.      A/P:   Discussed findings which are pasted below.  Consulted with Dr Marinus Maw.  Decided no surgical intervention warranted at this time,  referred to TMJ clinic at [facility] for initial conservative therapy.    FU PRN    Floy Sabina, DMD  OMFS Resident    MR TMJ bilateral without contrast  Exam Date & Time:  08/11/201512:37 PM  Accession #:  [id number]    Reason For Exam:   ERECORD: TMJ INTERNAL DERANGEMENT    Ordering Diagnosis:  TMJ (temporomandibular joint disorder)    Exam Site: [facility]  12/14/2013 12:37 PM  MR TMJ BILATERAL WITHOUT CONTRAST  ORDERING CLINICAL INFORMATION: ERECORD: TMJ INTERNAL DERANGEMENT  ADDITIONAL CLINICAL INFORMATION: None.  COMPARISON: None.  PROCEDURE: Imaging through the temporomandibular joints was   performed using a magnetic field strength of 1.5 Tesla. Multiple   sequences in the coronal and sagittal planes of the bilateral   temporomandibular joints were obtained during the open and closed   positions without intravenous contrast.  FINDINGS:  LEFT TMJ:  There is abnormal anterior dislocation of the TMJ articular disc in   the closed mouth position with reduction in the open-mouth position.  There are minimal erosive bony changes to include chondral thinning   and irregularity of the mandibular condyle.  No joint effusion is present.  RIGHT TMJ:  TMJ articular disc is normally positioned in the closed and   open-mouth states.  There are no significant erosive changes.  Small joint effusion is present which changes configuration between   the closed and open-mouth positions.  IMPRESSION:   IMPRESSION:  1. Abnormal anterior dislocation of the left-sided articular disc in   the closed mouth position with reduction on the open mouth position.   Minimal degenerative changes of the left mandibular condyle.  3. Small joint effusion involving the right TMJ with normal   positioning of the articular disc during closed and open mouth   positioning.  END REPORT  I have personally reviewed the image(s) and the resident's   interpretation and agree with or edited the findings.    Reading  Radiologist:  Lissa Merlin, MD  Braulio Conte, MD

## 2013-12-24 ENCOUNTER — Ambulatory Visit: Payer: Self-pay

## 2013-12-24 NOTE — Progress Notes (Signed)
[facility]  ADULT AMBULATORY GROUP THERAPY SERVICE REFERRAL     Identifying Data:  Patient: Kaylee Harris   Medical Record Number: [id number]  DOB: 15-Jul-1975  Address: [address]  [place] [address]     Patient Phone: [phone] (home) [phone] (work)    Insurance: No coverage found.        Check where patient is currently open in the system:     [x]   [facility]     []   [facility]     []   [place]    []   [facility]     []   [facility]    []   [facility]     []   [facility]     Axis I Diagnosis:   R PSY 1ST AXIS I 06/14/2013   AXIS I Major Depressive Disorder, Recurrent, Unspecified, 296.3      R PSY 2ND AXIS I 06/14/2013   AXIS I Anxiety Disorder NOS, 300   Comments R/O PTSD     R PSY 3RD AXIS I 10/19/2013   AXIS I - Multi Select Major Depressive Disorder, Recurrent, In Partial Remission, 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 No    Past PSYCHIATRIC HISTORY: Inpatient, Partial, Outpatient  Previous GROUP EXPERIENCE: Yes      Risk Factors:  Suicide or self-mutilation: Past  Violence: None  Alcohol Use: Past  Drug Use: None    Goals for Group Treatment:  Goal #1: Patient will continue DBT skills  Goal #2:     Adult Group Therapy Offerings (Please check preferential group and time):    []  Depression Management (Wed. 4:00-5:30)  []  Anxiety Management (Tues. 12:00-1:30)  []  Present Centered Group Therapy of Survivors of Trauma (Thurs. 4:30-6:00)   []  Focused Brief Group (Thurs. 12:00-1:30)  []  Anger Management (Mon. 5:30-6:30)  []  Unified Protocol for Treatment of Emotional Disorders (Tues. 5:00-6:30)    []  Distress Tolerance Skills Training:   []  Mon. 5:30-7:00 []  Tues. 4:30-6:00 []  Wed. 1:00-2:30   []   Wed.5:30-7:00     []  Interpersonal Skills Group:   []  Mon. 5:30-7:00 []  Tues. 4:30-6:00 []  Wed. 1:00-2:30   []  Wed.5:30-7:00     []  Emotional Regulation Skills Training:   []  Mon. 5:30-7:00 []  Tues. 4:30-6:00 []  Wed. 1:00-2:30   [x]  Wed.5:30-7:00    []  Dialectical Behavioral Therapy (DBT) Program (must have intensively trained DBT individual therapist)    Comments:  Is the patient receptive to this referral? Yes  Will you be involved in the patient's continued care? Yes  Does patient have an individual therapist? Yes    Name of Therapist: Tresa Res    Is he/she in support of this referral? Yes    Referral Processing Information:  The above information must be completed in full for referral to be processed.  Please send completed form to Luther Bradley, Psychiatry via [place].
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
## 2013-12-28 ENCOUNTER — Other Ambulatory Visit: Payer: Self-pay | Admitting: Psychiatry

## 2013-12-28 ENCOUNTER — Other Ambulatory Visit: Payer: Self-pay

## 2013-12-28 MED ORDER — BUSPIRONE HCL 15 MG PO TABS *I*
15.0000 mg | ORAL_TABLET | Freq: Two times a day (BID) | ORAL | Status: DC
Start: 2013-12-28 — End: 2014-02-02

## 2013-12-29 ENCOUNTER — Ambulatory Visit: Payer: Self-pay | Admitting: Psychiatry

## 2014-01-01 ENCOUNTER — Other Ambulatory Visit: Payer: Self-pay | Admitting: Psychiatry

## 2014-01-03 ENCOUNTER — Ambulatory Visit: Payer: Self-pay | Admitting: Psychiatry

## 2014-01-03 HISTORY — PX: LEFT COLECTOMY: SHX856

## 2014-01-04 ENCOUNTER — Ambulatory Visit
Admit: 2014-01-04 | Discharge: 2014-02-02 | Disposition: A | Discharge status: Home / Self Care | Payer: Self-pay | Source: Ambulatory Visit | Attending: Psychiatry | Admitting: Psychiatry

## 2014-01-09 ENCOUNTER — Encounter: Payer: Self-pay | Admitting: Gastroenterology

## 2014-01-10 ENCOUNTER — Encounter: Payer: Self-pay | Admitting: Gastroenterology

## 2014-01-13 ENCOUNTER — Ambulatory Visit: Payer: Self-pay

## 2014-01-13 ENCOUNTER — Encounter: Payer: Self-pay | Admitting: Gastroenterology

## 2014-01-13 ENCOUNTER — Telehealth: Payer: Self-pay

## 2014-01-13 NOTE — Telephone Encounter (Signed)
Kaylee Harris is still in the hospital, had surgery on 8/31.   Cannot come today, rescheduled to 9/28 at 2:15 p.m.

## 2014-01-15 ENCOUNTER — Encounter: Payer: Self-pay | Admitting: Gastroenterology

## 2014-01-17 ENCOUNTER — Telehealth: Payer: Self-pay

## 2014-01-17 NOTE — Telephone Encounter (Signed)
Contacted Collie Siad from [facility] she states pt was opened to [facility] on 01/15/14, wet to dry dressing with saline to abd wound from infection from exp. Lap, wound 9 cm deep. Also pt has UTI, no PICC line presently. Call placed to [facility] for discharge paperwork. Collie Siad states pt was in from 01/03/14-01/14/14 according to her paperwork .

## 2014-01-17 NOTE — Telephone Encounter (Signed)
Call to pt for [facility] inpatient follow up, spoke to her SO who states she is doing ok, pt has follow up scheduled here for 01/27/14 and doesn't feel she needs to be seen sooner at this point. [name] is encouraged to call should things change. She verbalizes understanding

## 2014-01-17 NOTE — Telephone Encounter (Signed)
Also calling to see if patient should still be receiving insulin shots, she was getting these in the hospital but now she is just on a pill form, please advise and call back Vila if needed

## 2014-01-17 NOTE — Telephone Encounter (Signed)
FYI;HCR-Patient opened to home care 01/15/14     Any questions-[phone]

## 2014-01-17 NOTE — Telephone Encounter (Signed)
Can we please call and find out hospital summary? 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    [name], MD  [facility]  01/17/2014  11:20 AM

## 2014-01-18 ENCOUNTER — Other Ambulatory Visit: Payer: Self-pay | Admitting: Psychiatry

## 2014-01-18 ENCOUNTER — Other Ambulatory Visit: Payer: Self-pay | Admitting: Primary Care

## 2014-01-18 ENCOUNTER — Encounter: Payer: Self-pay | Admitting: Primary Care

## 2014-01-18 DIAGNOSIS — L02211 Cutaneous abscess of abdominal wall: Secondary | ICD-10-CM

## 2014-01-18 HISTORY — DX: Cutaneous abscess of abdominal wall: L02.211

## 2014-01-18 MED ORDER — MELATONIN 5 MG PO CAPS
10.0000 mg | ORAL_CAPSULE | Freq: Every evening | ORAL | Status: DC
Start: 2014-01-18 — End: 2014-08-22

## 2014-01-18 MED ORDER — MELATONIN 5 MG PO CAPS
10.0000 mg | ORAL_CAPSULE | Freq: Every evening | ORAL | Status: DC
Start: 2014-01-18 — End: 2014-01-18

## 2014-01-18 MED ORDER — ALCOHOL WIPES PADS
MEDICATED_PAD | Status: DC
Start: 2014-01-18 — End: 2014-05-05

## 2014-01-18 NOTE — Telephone Encounter (Signed)
Sometimes with an infection the levels can be higher. Let's see how she does the rest of this week. Please have her email me on Friday morning with readings of her fasting levels for tomorrow, Thurs and Friday    [name], MD  [facility]  01/18/2014  11:14 AM

## 2014-01-18 NOTE — Telephone Encounter (Signed)
Call placed back to pt, relayed information provided by PCP in message below, she will check her fasting BG's over the next few days and send a MyChart message on Friday am, writer made her aware we close early Friday so best to send the information first thing in the am. She verbalizes understanding

## 2014-01-18 NOTE — Telephone Encounter (Signed)
Please tell her that no need for injections of insulin. Did she restart her glimepiride? Sometimes they hold this and/or metformin (which she isn't on, correct?) due to issues related to eating while hospitalized. Insulin injections often easier in the hospital, but doesn't mean she needs to continue them. Does she recall how much insulin she would receive when given it?    [name], MD  [facility]  01/18/2014  8:11 AM

## 2014-01-18 NOTE — Telephone Encounter (Signed)
Call placed to pt to relay information below, she doesn't know how much insulin she was on in the hospital, it was a sliding scale , states her fasting was 230 this am, she is taking her glimepiride as before, states her bg's are higher than normal,typically at 90 fasting. The only med changes are pain meds and ABX.

## 2014-01-21 ENCOUNTER — Encounter: Payer: Self-pay | Admitting: Primary Care

## 2014-01-21 MED ORDER — INSULIN PEN NEEDLE 30G X 8 MM MISC *A*
Status: DC
Start: 2014-01-21 — End: 2014-05-24

## 2014-01-21 MED ORDER — INSULIN GLARGINE 100 UNIT/ML SC SOPN *I*
10.0000 [IU] | PEN_INJECTOR | Freq: Every evening | SUBCUTANEOUS | Status: DC
Start: 2014-01-21 — End: 2014-04-12

## 2014-01-21 NOTE — Addendum Note (Signed)
Addended by: Grandville Silos on: 01/21/2014 10:52 AM     Modules accepted: Orders, Medications

## 2014-01-24 ENCOUNTER — Telehealth: Payer: Self-pay | Admitting: Psychiatry

## 2014-01-24 ENCOUNTER — Ambulatory Visit: Payer: Self-pay | Admitting: Psychiatry

## 2014-01-24 NOTE — Telephone Encounter (Signed)
Patient called to cancel appointment with [name]. Patient is cancelling the appointment due to severe complications from surgery site.   [name] Corigliano was notified of the cancellation.  Patient stated she will call and reschedule when better.

## 2014-01-26 ENCOUNTER — Ambulatory Visit
Admit: 2014-01-26 | Discharge: 2014-01-26 | Disposition: A | Discharge status: Home / Self Care | Payer: Self-pay | Source: Ambulatory Visit | Attending: Primary Care | Admitting: Primary Care

## 2014-01-26 DIAGNOSIS — E119 Type 2 diabetes mellitus without complications: Secondary | ICD-10-CM

## 2014-01-27 ENCOUNTER — Encounter: Payer: Self-pay | Admitting: Primary Care

## 2014-01-27 ENCOUNTER — Ambulatory Visit: Payer: Self-pay | Admitting: Primary Care

## 2014-01-27 VITALS — BP 120/80 | HR 83 | Temp 97.0°F | Ht 67.0 in | Wt 291.2 lb

## 2014-01-27 DIAGNOSIS — F419 Anxiety disorder, unspecified: Secondary | ICD-10-CM

## 2014-01-27 DIAGNOSIS — IMO0002 Reserved for concepts with insufficient information to code with codable children: Secondary | ICD-10-CM

## 2014-01-27 DIAGNOSIS — T8149XA Infection following a procedure, other surgical site, initial encounter: Secondary | ICD-10-CM

## 2014-01-27 LAB — HEMOGLOBIN A1C: Hemoglobin A1C: 6.7 % — ABNORMAL HIGH (ref 4.0–6.0)

## 2014-01-27 MED ORDER — DIAZEPAM 5 MG PO TABS *I*
5.0000 mg | ORAL_TABLET | Freq: Two times a day (BID) | ORAL | Status: DC | PRN
Start: 2014-01-27 — End: 2014-03-14

## 2014-01-27 MED ORDER — NYSTATIN 100000 UNIT/GM EX CREA *I*
TOPICAL_CREAM | Freq: Two times a day (BID) | CUTANEOUS | Status: DC
Start: 2014-01-27 — End: 2014-05-11

## 2014-01-27 NOTE — Progress Notes (Signed)
[facility]    SUBJECTIVE    Pt is here to discuss:    Chief Complaint   Patient presents with    Diabetes     1. 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 Lower abdomen with cellulitis changes, wound probed and purulent material expressed. D/c-ed home on 10d of Augmentin 9/11.     Partner [name] has been changing dressing BID. Noticed odor for the last week or so, describes yellow drainage vs fibrous exudate on most recent gauze. Also some mild redness on the skin near wound edges. Pt denies fevers, but had subjective chills and sweating yesterday/ Nervous because her previous infection started with similar feelings.    2. F/u DM and hyperglycemia - Pt contacted [place] following her d/c due to hyperglycemia. Required insulin during hospitalization. BGs were in 200s as of Friday, therefore we opted to start Lantus 10U. Received it on Tuesday. BGs Wednesday and today: 172, 156. Stopped glimepride on same night that she started Lantus. Denies episodes of hypoglycemia     3. Anxiety - Requesting refill of valium. Having hard time coping with hospitalizations, wound changes, her partner Shari's illness (mult sclerosis with flare the other day).       PMH / Family Hx / Social Hx  Patient's medications, allergies, problem list, past medical, social histories were reviewed and notable for:    Lives with partner [name] who is also ill with mult sclerosis, currently on steroids for recent flare    Current Outpatient Prescriptions   Medication Sig Note    insulin glargine (LANTUS SOLOSTAR) 100 UNIT/ML injection pen Inject 10 Units into the skin nightly     insulin pen needle (NOVOFINE) 30G X 8 MM Use 1 times a day as instructed.     Melatonin 5 MG CAPS Take 10 mg by mouth nightly     Alcohol Swabs (ALCOHOL WIPES) PADS Use BID for BG check     busPIRone (BUSPAR) 15 MG tablet Take 1 tablet (15 mg total) by mouth 2 times daily     SUMAtriptan (IMITREX) 50 MG  tablet Take 1 tablet (50 mg total) by mouth as needed for Migraine   Take at onset of headache. May repeat once in 2 hours.     gabapentin (NEURONTIN) 800 MG tablet Take 1 tablet (800 mg total) by mouth 3 times daily     omeprazole (PRILOSEC) 20 MG capsule TAKE 1 CAPSULE (20 MG TOTAL) BY MOUTH DAILY (BEFORE BREAKFAST)     mirtazapine (REMERON) 7.5 MG tablet Take 1 tablet (7.5 mg total) by mouth nightly     fluticasone (FLONASE) 50 MCG/ACT nasal spray 1 spray by Nasal route daily     cyclobenzaprine (FLEXERIL) 5 MG tablet TAKE 1 TABLET (5 MG TOTAL) BY MOUTH 3 TIMES DAILY AS NEEDED FOR MUSCLE SPASMS     diazepam (VALIUM) 5 MG tablet Take 1 tablet (5 mg total) by mouth 2 times daily as needed for Anxiety   Max daily dose: 10 mg     lurasidone (LATUDA) 60 MG tablet Take 1 tablet (60 mg total) by mouth daily     oxyCODONE-acetaminophen (PERCOCET) 5-325 MG per tablet Take 1 tablet by mouth every 6 hours as needed for Pain   Max daily dose: 4 tablets     midodrine (PROAMATINE) 5 MG tablet Take 5 mg by mouth 2 times daily     DULoxetine (CYMBALTA) 60 MG capsule Take 1 capsule (60  mg total) by mouth daily     VENTOLIN HFA 108 (90 BASE) MCG/ACT inhaler Inhale 1-2 puffs into the lungs every 4-6 hours as needed for Wheezing     dicyclomine (BENTYL) 20 MG tablet  08/18/2013: Received from: External Pharmacy    topiramate (TOPAMAX) 50 MG tablet Take 1 tablet (50 mg total) by mouth 2 times daily     lancets Brand Free Style Lite; Use 2 times per day as directed for blood glucose testing.     FREESTYLE LITE test strip Use BID as directed for 250.02     naproxen sodium (ANAPROX) 550 MG tablet Take 1 tablet (550 mg total) by mouth 2 times daily (with meals)     famciclovir (FAMVIR) 500 MG tablet Take 1 tablet (500 mg total) by mouth 3 times daily as needed     blood glucose monitor system Brand: cheapest brand available per her insurance.  Use as directed.     atorvastatin (LIPITOR) 40 MG tablet Take 40 mg by mouth  daily (with dinner)     ranolazine (RANEXA) 500 MG 12 hr tablet Take 500 mg by mouth 2 times daily   Swallow whole. Do not crush, break, or chew.     Non-System Medication The above patient is followed in our clinic and cannot resume work permanently.     ondansetron (ZOFRAN) 8 MG tablet Take 1 tablet (8 mg total) by mouth 3 times daily as needed for Nausea        ROS  Per HPI      OBJECTIVE  Filed Vitals:    01/27/14 1456   BP: 120/80   Pulse: 83   Temp: 36.1 C (97 F)   TempSrc: Temporal   Height: 1.702 m (5\' 7" )   Weight: 132.087 kg (291 lb 3.2 oz)     Body mass index is 45.6 kg/(m^2).      General: well-appearing obese Caucasian female, pleasant & conversant, in NAD  Abd: S/NT/ND. Port sites at LUQ, RUQ c/d/i. Port site superior to umbilicus opened a few millimeters with fibrinous healing tissue underneath. 4x4cm open wound inferior to umbilicus appears to be 2cm deep, beefy red tissue without drainage or exudate. Base visualized and no purulent drainage within pocket seen. Abdomen nontender to palpation, normoactive BS, unable to express any pus within abdomen.  Skin: Evidence of dermatitis in square shape which correlate with dressing tape. Also with papular lesions closer to wound edges that show confluence in some areas.  Psych: AAOx3, normal affect and mood. Insight and judgement intact.   Feet: no evidence of ischemia or infection other than mild onychomycosis. Sensation to monofilament intact b/l.        ASSESSMENT & PLAN  1. Postoperative wound abscess  Healing nicely, no current signs of reinfection per pt and wife's concerns. Per hospital d/c summary, wound was initially 9cm deep and now 2cm per partner [name] (told this by recent visiting nurse). Redness that they are concerned about appears more like candidal infection which could also account for odor. Recommended topical nystatin. Also having contact dermatitis from tape, therefore wound was repacked and tegaderm was applied. [name] was given a  few samples to continue use at home. They have f/u with Dr. Tresa Res in 2 weeks and I will communicate the findings to day to him.     2. DM (diabetes mellitus), type 2, uncontrolled  Encouraged to continue current Lantus dose, reviewed warning signs of hypoglycemia. We will follow up on Monday to  review fasting BGs. Advised to titrate up by 2 units if consistently in 150s, or down 2 units if BGs reaching 100s. Pt to call if any acute hypoglycemia. Foot exam today showed sensation to monofilament intact.  - Flu vaccine quadrivalent greater than or equal to 3yo with preservative IM    3. Anxiety  Rx for valium provided. Encouraged to f/u with psych (pt has missed many appts due to hospitalizations and wife's recent mult scl flare).         Follow-up: 3 months in the office, sooner via [place], MD  [place]  01/27/2014  3:01 PM        ______________________

## 2014-01-31 ENCOUNTER — Other Ambulatory Visit: Payer: Self-pay | Admitting: Neurology

## 2014-01-31 ENCOUNTER — Ambulatory Visit: Payer: Self-pay

## 2014-01-31 ENCOUNTER — Encounter: Payer: Self-pay | Admitting: Primary Care

## 2014-01-31 NOTE — Progress Notes (Signed)
Behavioral Health Progress Note     LENGTH OF SESSION: 45 minutes    Contact Type:  Location: On Site    Face to Face     Problem(s)/Goals Addressed from Treatment Plan:    Problem 1:   R PSY TP PROBLEM 01/31/2014   1ST TREATMENT PLAN PROBLEM Depression and anxiety       Goal for this problem:    R PSY TP GOAL 01/31/2014   1ST TREATMENT PLAN GOAL Learn DBT and PST skills to manage affect       Progress towards this goal: Problem resolving. Comment Patient reports some issues with her surgery, but is utilizing skills.    Mental Status Exam:  APPEARANCE: Appears stated age, Well-groomed, Casual  ATTITUDE TOWARD INTERVIEWER: Cooperative  MOTOR ACTIVITY: WNL (within normal limits)  EYE CONTACT: Direct  SPEECH: Normal rate and tone  AFFECT: Full Range  MOOD: Lively and Neutral  THOUGHT PROCESS: Normal  THOUGHT CONTENT: No unusual themes  PERCEPTION: No evidence of hallucinations  ORIENTATION: Alert and Oriented X 3.  CONCENTRATION: WNL  MEMORY:   Recent: intact   Remote: intact  COGNITIVE FUNCTION: Average intelligence  JUDGMENT: Intact  IMPULSE CONTROL: Fair  INSIGHT: Good    Risk Assessment:  ASSESSMENT OF RISK FOR SUICIDAL BEHAVIOR  Changes in risk for suicide from baseline Formulation of Risk and/or previous intake, including newly identified risk, if any: none  Violence risk was assessed and No Change noted from baseline formulation of risk and/or previous assessment.    Session Content:: Patient talked about her recent surgery and said that this was traumatic due to developing an infection. She said that her brother came to visit her twice which made her happy. Patient said that while she was in hospital, the pain medications allowed her to talk to her brother about some of the worries she had been having about their relationship. She said that this was a productive conversation. Writer talked to patient about patient's thought distortion around this previously and patient acknowledged this. Patient said that she has  had an increase in anxiety and ruminative thoughts about the woman she had an affair with. She said that she was able to discuss this with her wife in the moment. Patient told Probation officer that she is not sleeping due to pain and that this is likely contributing to this. Writer and patient discussed patient being out of treatment for an extended period and how this affects her. Patient said that she would reschedule with [name], NP and that she intends to return to group next week.     Interventions:    Dialectical Behavioral Therapy (DBT), Supportive Psychotherapy    Plan:  Psychotherapy continues as described in care plan; plan remains the same.    NEXT APPT: 02/16/14 @4p .m.      Tresa Res, LMSW
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
## 2014-02-02 ENCOUNTER — Other Ambulatory Visit: Payer: Self-pay | Admitting: Primary Care

## 2014-02-02 ENCOUNTER — Ambulatory Visit: Payer: Self-pay | Admitting: Psychiatry

## 2014-02-02 VITALS — BP 149/83 | HR 87 | Ht 66.93 in | Wt 290.0 lb

## 2014-02-02 DIAGNOSIS — F603 Borderline personality disorder: Secondary | ICD-10-CM

## 2014-02-02 DIAGNOSIS — F429 Obsessive-compulsive disorder, unspecified: Secondary | ICD-10-CM

## 2014-02-02 DIAGNOSIS — F419 Anxiety disorder, unspecified: Secondary | ICD-10-CM

## 2014-02-02 DIAGNOSIS — F331 Major depressive disorder, recurrent, moderate: Secondary | ICD-10-CM

## 2014-02-02 MED ORDER — BUSPIRONE HCL 15 MG PO TABS *I*
15.0000 mg | ORAL_TABLET | Freq: Two times a day (BID) | ORAL | Status: DC
Start: 2014-02-02 — End: 2014-05-18

## 2014-02-02 MED ORDER — LURASIDONE HCL 80 MG PO TABS *I*
80.0000 mg | ORAL_TABLET | Freq: Every day | ORAL | Status: DC
Start: 2014-02-02 — End: 2014-02-21
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
## 2014-02-03 ENCOUNTER — Ambulatory Visit
Admit: 2014-02-03 | Discharge: 2014-03-05 | Disposition: A | Discharge status: Home / Self Care | Payer: Self-pay | Source: Ambulatory Visit | Attending: Psychiatry | Admitting: Psychiatry

## 2014-02-03 ENCOUNTER — Encounter: Payer: Self-pay | Admitting: Primary Care

## 2014-02-03 HISTORY — PX: OTHER SURGICAL HISTORY: SHX169

## 2014-02-07 ENCOUNTER — Encounter: Payer: Self-pay | Admitting: Primary Care

## 2014-02-09 ENCOUNTER — Ambulatory Visit: Payer: Self-pay | Admitting: Clinical

## 2014-02-10 ENCOUNTER — Ambulatory Visit: Payer: Self-pay | Admitting: Ophthalmology

## 2014-02-10 ENCOUNTER — Telehealth: Payer: Self-pay

## 2014-02-10 NOTE — Telephone Encounter (Signed)
Per email dated 02/07/14, this nurse is informed that pt has been instructed by Dr. Laurance Flatten, to increase lantus to 14 units and to report new BG's to this office in one week.    Kaylee Harris expresses understanding.

## 2014-02-10 NOTE — Telephone Encounter (Signed)
Transferred to a nurse

## 2014-02-11 ENCOUNTER — Encounter: Payer: Self-pay | Admitting: Primary Care

## 2014-02-16 ENCOUNTER — Ambulatory Visit: Payer: Self-pay

## 2014-02-16 ENCOUNTER — Ambulatory Visit: Payer: Self-pay | Admitting: Clinical

## 2014-02-16 ENCOUNTER — Encounter: Payer: Self-pay | Admitting: Gastroenterology

## 2014-02-16 DIAGNOSIS — F3341 Major depressive disorder, recurrent, in partial remission: Secondary | ICD-10-CM

## 2014-02-16 NOTE — Progress Notes (Signed)
Behavioral Health Progress Note     LENGTH OF SESSION: 50 minutes    Contact Type:  Location: On Site    Face to Face     Problem(s)/Goals Addressed from Treatment Plan:    Problem 1:   R PSY TP PROBLEM 01/31/2014   1ST TREATMENT PLAN PROBLEM Depression and anxiety       Goal for this problem:    R PSY TP GOAL 01/31/2014   1ST TREATMENT PLAN GOAL Learn DBT and PST skills to manage affect       Progress towards this goal: Problem resolving. Comment Patient reports a continued balanced mood.    Mental Status Exam:  APPEARANCE: Appears stated age, Well-groomed  ATTITUDE TOWARD INTERVIEWER: Cooperative and Evasive  MOTOR ACTIVITY: WNL (within normal limits)  EYE CONTACT: Avoidant and Direct  SPEECH: Normal rate and tone  AFFECT: Full Range  MOOD: Anxious, Depressed, Lively and Neutral  THOUGHT PROCESS: Normal  THOUGHT CONTENT: No unusual themes  PERCEPTION: No evidence of hallucinations  ORIENTATION: Alert and Oriented X 3.  CONCENTRATION: Fair  MEMORY:   Recent: intact   Remote: intact  COGNITIVE FUNCTION: Average intelligence  JUDGMENT: Intact  IMPULSE CONTROL: Fair  INSIGHT: Fair    Risk Assessment:  ASSESSMENT OF RISK FOR SUICIDAL BEHAVIOR  Changes in risk for suicide from baseline Formulation of Risk and/or previous intake, including newly identified risk, if any: none  Violence risk was assessed and No Change noted from baseline formulation of risk and/or previous assessment.    Session Content::  Patient told writer that things were going well. Patient said that her mother and uncle both had hospital episodes. Patient talked about her thoughts and feelings around wanting to help vs letting the professionals do their jobs. Patient said that she did not want to continue group when writer asked patient where her folder was. She said that she already did the emotion regulation group and did not want to do this again. Writer talked to patient about what skills patient is currently using. Patient said that she has not  been doing diary cards, but that her wife says that she is using skills all the time. Writer talked to patient about continuing group for skills reinforcement and patient talked to group facilitator and told him she would attend next week.     Interventions:    Dialectical Behavioral Therapy (DBT), Motivational Interviewing, Supportive Psychotherapy    Plan:  Psychotherapy continues as described in care plan; plan remains the same.    NEXT APPT: 03/04/14 @1 :[place], LMSW

## 2014-02-17 ENCOUNTER — Telehealth: Payer: Self-pay

## 2014-02-17 NOTE — Telephone Encounter (Signed)
Returned call to [place], confirmed that pt's dose of Lantus is currently 16 units daily

## 2014-02-17 NOTE — Telephone Encounter (Signed)
Erin called from Visiting nurse service regarding patient Kaylee Harris. She would like to speak to a nurse regarding medication question [phone]

## 2014-02-20 ENCOUNTER — Other Ambulatory Visit: Payer: Self-pay | Admitting: Primary Care

## 2014-02-21 ENCOUNTER — Other Ambulatory Visit: Payer: Self-pay | Admitting: Psychiatry

## 2014-02-21 ENCOUNTER — Encounter: Payer: Self-pay | Admitting: Primary Care

## 2014-02-21 ENCOUNTER — Other Ambulatory Visit: Payer: Self-pay | Admitting: Nurse Practitioner

## 2014-02-21 MED ORDER — LURASIDONE HCL 80 MG PO TABS *I*
80.0000 mg | ORAL_TABLET | Freq: Every day | ORAL | Status: DC
Start: 2014-02-21 — End: 2014-03-02

## 2014-02-23 ENCOUNTER — Encounter: Payer: Self-pay | Admitting: Gastroenterology

## 2014-02-23 ENCOUNTER — Ambulatory Visit: Payer: Self-pay | Admitting: Clinical

## 2014-02-23 ENCOUNTER — Encounter: Payer: Self-pay | Admitting: Psychiatry

## 2014-02-23 NOTE — Progress Notes (Signed)
Spoke to patient regarding sleep difficulties, offered her to see NP tomorrow morning 10/22 but patient declined saying she could not come in then.  Will keep 10/28 appointment with NP and in the meantime will contact surgeon who prescribed ambien to give her a bridge prescription until she sees NP next week.

## 2014-02-24 ENCOUNTER — Telehealth: Payer: Self-pay | Admitting: Primary Care

## 2014-02-24 NOTE — Telephone Encounter (Signed)
Per email dated 02/21/14, Dr Laurance Flatten did communicate with pt to increase lantus to 19 units daily.    This is clarified with HCR nurse, Junie Panning.

## 2014-02-24 NOTE — Progress Notes (Signed)
Clinical Management Note   Per [name], pt to schedule an earlier appointment to discuss options. Pt declined earlier appointment at this time and is scheduled with [name] on 03/02/14 at 4:20 pm.  FW: Prescription Question     Rickard Rhymes, RN    Sent: Wed February 23, 2014 3:12 PM    To: Garry Heater, NP    Cc: Alan Ripper, RN     Kaylee Harris    MRN: [id number] DOB: 01-Jan-1976    Pt Work: [id number] Pt Home: [phone]    Entered: [phone]        Encounter Documentation     No notes of this type exist for this encounter.      Message     Called patient who reports that the surgeon that did her colectomy in August prescribed ambien 5 mg to be taken nightly. Patient reports having taken it 20 days in a row and last taken Saturday 10/17. She says she is falling asleep but doesn't stay asleep and is restless with ruminative thoughts all night long. She says she Slept all night with taking the ambien. Next scheduled appointment 10/28. Please advise.    ----- Message -----    From: Kaylee Harris    Sent: 02/23/2014 3:00 PM    To: [facility] Nurse    Subject: Prescription Question         I am having a very hard to. even sleeping at night is there any way we can try something different? I know the surgent gave me Ambiein that seamed to work. what due you think?

## 2014-02-24 NOTE — Telephone Encounter (Signed)
Erin from [facility] - patient told her that her lantus insulin was increased to 18 units, she wants to confirm this.    Thanks  Quaniyah Bugh

## 2014-03-01 ENCOUNTER — Encounter: Payer: Self-pay | Admitting: Primary Care

## 2014-03-02 ENCOUNTER — Encounter: Payer: Self-pay | Admitting: Gastroenterology

## 2014-03-02 ENCOUNTER — Ambulatory Visit: Payer: Self-pay | Admitting: Clinical

## 2014-03-02 ENCOUNTER — Ambulatory Visit: Payer: Self-pay | Admitting: Psychiatry

## 2014-03-02 VITALS — BP 153/73 | HR 91 | Ht 66.93 in | Wt 295.0 lb

## 2014-03-02 DIAGNOSIS — F429 Obsessive-compulsive disorder, unspecified: Secondary | ICD-10-CM

## 2014-03-02 DIAGNOSIS — F339 Major depressive disorder, recurrent, unspecified: Secondary | ICD-10-CM

## 2014-03-02 DIAGNOSIS — F419 Anxiety disorder, unspecified: Secondary | ICD-10-CM

## 2014-03-02 MED ORDER — ZOLPIDEM TARTRATE 6.25 MG PO TBCR *A*
6.2500 mg | ORAL_TABLET | Freq: Every evening | ORAL | Status: DC | PRN
Start: 2014-03-02 — End: 2014-03-29

## 2014-03-02 MED ORDER — LURASIDONE HCL 60 MG PO TABS *A*
60.0000 mg | ORAL_TABLET | Freq: Every day | ORAL | Status: DC
Start: 2014-03-02 — End: 2014-05-05

## 2014-03-02 NOTE — Progress Notes (Signed)
Behavioral Health Psychopharmacology Follow-up     Length of Session: 20 minutes.    Diagnosis Addressed    ICD-10-CM ICD-9-CM    1. Major depression, recurrent F33.9 296.30    2. OCD (obsessive compulsive disorder) F42 300.3    3. Anxiety F41.9 300.00        Recent History and Response to Medications  Patient states:   HPI     Follow-up    Additional comments: patient here for medication evaluation             Current use of alcohol or drugs: No        Neurovegetative Symptoms Review:  Energy level: poor  Concentration: poor  Sleep Quality: poor (awakening during night unable to go back to sleep, ruminatin)   Number of hours : 5  Appetite: poor     Wt Readings from Last 3 Encounters:   03/02/14 133.811 kg (295 lb)   02/02/14 131.543 kg (290 lb)   01/27/14 132.087 kg (291 lb 3.2 oz)     Enjoyment/interest: fair        Current Medications  Current Outpatient Prescriptions   Medication Sig    lidocaine (LIDODERM) 5 % patch     naproxen sodium (ANAPROX) 550 MG tablet TAKE 1 TABLET (550 MG TOTAL) BY MOUTH 2 TIMES DAILY (WITH MEALS)    lurasidone (LATUDA) 80 MG tablet Take 1 tablet (80 mg total) by mouth daily   With dinner.    busPIRone (BUSPAR) 15 MG tablet Take 1 tablet (15 mg total) by mouth 2 times daily    omeprazole (PRILOSEC) 20 MG capsule TAKE 1 CAPSULE (20 MG TOTAL) BY MOUTH DAILY (BEFORE BREAKFAST)    insulin glargine (LANTUS SOLOSTAR) 100 UNIT/ML injection pen Inject 10 Units into the skin nightly (Patient taking differently: Inject 20 Units into the skin nightly   )    insulin pen needle (NOVOFINE) 30G X 8 MM Use 1 times a day as instructed.    Melatonin 5 MG CAPS Take 10 mg by mouth nightly    Alcohol Swabs (ALCOHOL WIPES) PADS Use BID for BG check    gabapentin (NEURONTIN) 800 MG tablet Take 1 tablet (800 mg total) by mouth 3 times daily    mirtazapine (REMERON) 7.5 MG tablet Take 1 tablet (7.5 mg total) by mouth nightly    fluticasone (FLONASE) 50 MCG/ACT nasal spray 1 spray by Nasal route  daily    cyclobenzaprine (FLEXERIL) 5 MG tablet TAKE 1 TABLET (5 MG TOTAL) BY MOUTH 3 TIMES DAILY AS NEEDED FOR MUSCLE SPASMS    oxyCODONE-acetaminophen (PERCOCET) 5-325 MG per tablet Take 1 tablet by mouth every 6 hours as needed for Pain   Max daily dose: 4 tablets    midodrine (PROAMATINE) 5 MG tablet Take 5 mg by mouth 2 times daily    DULoxetine (CYMBALTA) 60 MG capsule Take 1 capsule (60 mg total) by mouth daily    dicyclomine (BENTYL) 20 MG tablet     topiramate (TOPAMAX) 50 MG tablet Take 1 tablet (50 mg total) by mouth 2 times daily    lancets Brand Free Style Lite; Use 2 times per day as directed for blood glucose testing.    FREESTYLE LITE test strip Use BID as directed for 250.02    famciclovir (FAMVIR) 500 MG tablet Take 1 tablet (500 mg total) by mouth 3 times daily as needed    blood glucose monitor system Brand: cheapest brand available per her insurance.  Use as directed.  atorvastatin (LIPITOR) 40 MG tablet Take 40 mg by mouth daily (with dinner)    ranolazine (RANEXA) 500 MG 12 hr tablet Take 500 mg by mouth 2 times daily   Swallow whole. Do not crush, break, or chew.    Non-System Medication The above patient is followed in our clinic and cannot resume work permanently.    nystatin (MYCOSTATIN) cream Apply topically 2 times daily    diazepam (VALIUM) 5 MG tablet Take 1 tablet (5 mg total) by mouth 2 times daily as needed for Anxiety   Max daily dose: 10 mg    SUMAtriptan (IMITREX) 50 MG tablet Take 1 tablet (50 mg total) by mouth as needed for Migraine   Take at onset of headache. May repeat once in 2 hours.    VENTOLIN HFA 108 (90 BASE) MCG/ACT inhaler Inhale 1-2 puffs into the lungs every 4-6 hours as needed for Wheezing    ondansetron (ZOFRAN) 8 MG tablet Take 1 tablet (8 mg total) by mouth 3 times daily as needed for Nausea     No current facility-administered medications for this visit.       Side Effects  Patient Reported Side Effects: None reported    Mental  Status  APPEARANCE: Casual  ATTITUDE TOWARD INTERVIEWER: Cooperative  MOTOR ACTIVITY: WNL (within normal limits)  EYE CONTACT: Direct  SPEECH: Normal rate and tone  AFFECT: Full Range and Appropriate  MOOD: Anxious and Depressed  THOUGHT PROCESS: Normal  THOUGHT CONTENT: No unusual themes  PERCEPTION: Hallucinations Auditory and report hearing music  ORIENTATION: Alert and Oriented X 3.  CONCENTRATION: Fair  MEMORY:   Recent: intact   Remote: intact  COGNITIVE FUNCTION: Average intelligence  JUDGMENT: Intact  IMPULSE CONTROL: Fair  INSIGHT: Fair    Risk Assessment    Self Injury: Patient Denies  Suicidal Ideation: Patient Denies  Homicidal Ideation: Patient Denies  Aggressive Behavior: Yes. Describe: verbally toward her wife.    Results  Results for Kaylee Harris, Kaylee Harris (MRN [id number]) as of 03/02/2014 16:34   Ref. Range 01/26/2014 13:32   Hemoglobin A1C Latest Range: 4.0-6.0 % 6.7 (H)       BP Readings from Last 3 Encounters:   03/02/14 153/73   02/02/14 149/83   01/27/14 120/80       Assessment  FORMULATION: Patient reports that when she first went on the Latuda 80 mg she felt better for the first 3 days.  Then she started feeling jittery, couldn't sit still.  "I just wanted to go to bed.  That's all I want to do is go to sleep but I can't go to sleep."  She is going to bed at 10 pm but does not get out of bed until 11 Am.  When she was taking the Ambien, she woke up at 9 AM.  She denies any amnesic episodes.  We discussed this is too much sleep.  She also reports that  has been having dreams again about her father in law.  She is still doing a lot of compulsions like checking the locks and has a hard time washing her hair "because I don't like the feel of it."  We discussed lowering the Latuda as she most likely is having akathesia from the increase.  Writer also will start her on Ambien CR 6.25 mg nightly prn for sleep.  Writer recommended that she use it for a week to try to re-establish a regular sleep pattern and then  to try going with out it. Writer also  recommended she get up at 8 AM every day.  Patient agreed to try this.      Recommendations/Plan and Rationale  PLAN: Decrease Latuda to 60 mg daily.  Begin ambien CR 6.25 nightly prn.  Return in 4 weeks.

## 2014-03-04 ENCOUNTER — Ambulatory Visit: Payer: Self-pay

## 2014-03-04 DIAGNOSIS — F3341 Major depressive disorder, recurrent, in partial remission: Secondary | ICD-10-CM

## 2014-03-04 NOTE — Progress Notes (Signed)
[facility]  ADULT AMBULATORY GROUP THERAPY SERVICE REFERRAL     Identifying Data:  Patient: Kaylee Harris   Medical Record Number: [id number]  DOB: 02-05-1976  Address: [address]  [place] [address]     Patient Phone: [phone] (home) [phone] (work)    Insurance: No coverage found.        Check where patient is currently open in the system:     []   [facility]     []   [facility]     []   [facility]    []   [facility]     []   [facility]    []   [facility]     []   [facility]     Axis I Diagnosis:     ICD-10-CM ICD-9-CM    1. 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 No    Past PSYCHIATRIC HISTORY: Inpatient, Partial, Outpatient  Previous GROUP EXPERIENCE: Yes      Risk Factors:  Suicide or self-mutilation: Past  Violence: None  Alcohol Use: Current  Drug Use: None    Goals for Group Treatment:  Goal #1: Patient will continue group therapy for behavior modification  Goal #2:     Adult Group Therapy Offerings (Please check preferential group and time):    []  Depression Management (Wed. 4:00-5:30)  []  Anxiety Management (Tues. 12:00-1:30)  []  Present Centered Group Therapy of Survivors of Trauma (Thurs. 4:30-6:00)   []  Focused Brief Group (Thurs. 12:00-1:30)  []  Anger Management (Mon. 5:30-6:30)  []  Unified Protocol for Treatment of Emotional Disorders (Tues. 5:00-6:30)    []  Distress Tolerance Skills Training:   []  Mon. 5:30-7:00 []  Tues. 4:30-6:00 []  Wed. 1:00-2:30   []  Wed.5:30-7:00     []  Interpersonal Skills Group:   []  Mon. 5:30-7:00 []  Tues. 4:30-6:00 []  Wed. 1:00-2:30   []  Wed.5:30-7:00     []  Emotional Regulation Skills Training:   []  Mon. 5:30-7:00 []  Tues. 4:30-6:00 []  Wed. 1:00-2:30   []  Wed.5:30-7:00    []  Dialectical Behavioral Therapy (DBT) Program (must have intensively trained DBT individual  therapist)    Comments:  Is the patient receptive to this referral? Yes  Will you be involved in the patient's continued care? Yes  Does patient have an individual therapist? Yes    Name of Therapist: Tresa Res    Is he/she in support of this referral? Yes      Referral Processing Information:  The above information must be completed in full for referral to be processed.  Please send completed form to Luther Bradley, Psychiatry via [place].
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
## 2014-03-06 ENCOUNTER — Ambulatory Visit
Admit: 2014-03-06 | Discharge: 2014-04-04 | Disposition: A | Discharge status: Home / Self Care | Payer: Self-pay | Source: Ambulatory Visit | Attending: Psychiatry | Admitting: Psychiatry

## 2014-03-07 ENCOUNTER — Other Ambulatory Visit: Payer: Self-pay | Admitting: Primary Care

## 2014-03-07 MED ORDER — GABAPENTIN 800 MG PO TABS
800.0000 mg | ORAL_TABLET | Freq: Three times a day (TID) | ORAL | Status: DC
Start: 2014-03-07 — End: 2014-03-21

## 2014-03-09 ENCOUNTER — Encounter: Payer: Self-pay | Admitting: Gastroenterology

## 2014-03-14 ENCOUNTER — Other Ambulatory Visit: Payer: Self-pay | Admitting: Primary Care

## 2014-03-16 ENCOUNTER — Encounter: Payer: Self-pay | Admitting: Gastroenterology

## 2014-03-18 ENCOUNTER — Ambulatory Visit: Payer: Self-pay

## 2014-03-18 DIAGNOSIS — F3341 Major depressive disorder, recurrent, in partial remission: Secondary | ICD-10-CM
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
## 2014-03-20 ENCOUNTER — Other Ambulatory Visit: Payer: Self-pay | Admitting: Primary Care

## 2014-03-24 ENCOUNTER — Other Ambulatory Visit: Payer: Self-pay | Admitting: Primary Care

## 2014-03-24 MED ORDER — VENTOLIN HFA 108 (90 BASE) MCG/ACT IN AERS
1.0000 | INHALATION_SPRAY | RESPIRATORY_TRACT | Status: DC | PRN
Start: 2014-03-24 — End: 2014-12-12

## 2014-03-28 ENCOUNTER — Other Ambulatory Visit: Payer: Self-pay | Admitting: Psychiatry

## 2014-03-28 ENCOUNTER — Ambulatory Visit: Payer: Self-pay | Admitting: Psychiatry

## 2014-03-28 NOTE — Telephone Encounter (Signed)
Patient has rescheduled appointment 12/28@ 2 pm.  Patient thought appointment  was on 11/24.

## 2014-03-29 ENCOUNTER — Other Ambulatory Visit: Payer: Self-pay | Admitting: Psychiatry

## 2014-03-29 MED ORDER — ZOLPIDEM TARTRATE 6.25 MG PO TBCR *A*
6.2500 mg | ORAL_TABLET | Freq: Every evening | ORAL | Status: DC | PRN
Start: 2014-03-29 — End: 2014-05-26

## 2014-03-29 MED ORDER — MIRTAZAPINE 7.5 MG PO TABS *A*
7.5000 mg | ORAL_TABLET | Freq: Every evening | ORAL | Status: DC
Start: 2014-03-29 — End: 2014-05-17

## 2014-03-29 NOTE — Progress Notes (Signed)
[facility] MISSED/CANCELLED APPOINTMENT     Name: Kaylee Harris  MRN: [id number]   DOB: 1975/12/23    Date of Scheduled Service: 03/28/14    Ms. Platas was a no show for today's appointment.  I have sent the patient a letter regarding today's missed appointment.    Additional Information:    therapist notified

## 2014-04-04 ENCOUNTER — Encounter: Payer: Self-pay | Admitting: Gastroenterology

## 2014-04-05 ENCOUNTER — Ambulatory Visit: Payer: Self-pay

## 2014-04-05 ENCOUNTER — Ambulatory Visit
Admit: 2014-04-05 | Discharge: 2014-05-02 | Disposition: A | Discharge status: Other Acute Inpatient Hospital | Payer: Self-pay | Source: Ambulatory Visit | Attending: Psychiatry | Admitting: Psychiatry

## 2014-04-05 ENCOUNTER — Telehealth: Payer: Self-pay

## 2014-04-05 DIAGNOSIS — IMO0001 Reserved for inherently not codable concepts without codable children: Secondary | ICD-10-CM | POA: Insufficient documentation

## 2014-04-05 NOTE — Telephone Encounter (Signed)
Patient was recently in the hospital for an concussion doctor told patient to call and cancel her scheduled apt, patient will call back to reschedule when she knows her schedule

## 2014-04-06 ENCOUNTER — Telehealth: Payer: Self-pay

## 2014-04-06 NOTE — Telephone Encounter (Signed)
Call to pt for [facility] f/u, no answer, left message to call back

## 2014-04-06 NOTE — Telephone Encounter (Signed)
Patient called back she said that urgent care sent her to [facility] and she was diagnoses with a concussion. She said that she is already scheduled to see Dr. Laurance Flatten on 04/12/14. She said she would like to wait until then to come to our office. She did say that if she feels she need to come in sooner then she will call for a same day appointment.

## 2014-04-08 ENCOUNTER — Encounter: Payer: Self-pay | Admitting: Gastroenterology

## 2014-04-10 ENCOUNTER — Encounter: Payer: Self-pay | Admitting: Gastroenterology

## 2014-04-11 ENCOUNTER — Encounter: Payer: Self-pay | Admitting: Gastroenterology

## 2014-04-12 ENCOUNTER — Ambulatory Visit: Payer: Self-pay | Admitting: Primary Care

## 2014-04-12 ENCOUNTER — Encounter: Payer: Self-pay | Admitting: Primary Care

## 2014-04-12 VITALS — BP 132/88 | HR 72 | Temp 97.8°F | Ht 66.93 in | Wt 297.2 lb

## 2014-04-12 DIAGNOSIS — E1169 Type 2 diabetes mellitus with other specified complication: Secondary | ICD-10-CM

## 2014-04-12 DIAGNOSIS — S060XAA Concussion with loss of consciousness status unknown, initial encounter: Secondary | ICD-10-CM

## 2014-04-12 DIAGNOSIS — S060X9A Concussion with loss of consciousness of unspecified duration, initial encounter: Secondary | ICD-10-CM

## 2014-04-12 DIAGNOSIS — E669 Obesity, unspecified: Secondary | ICD-10-CM

## 2014-04-12 DIAGNOSIS — R55 Syncope and collapse: Secondary | ICD-10-CM

## 2014-04-12 DIAGNOSIS — G4700 Insomnia, unspecified: Secondary | ICD-10-CM

## 2014-04-12 MED ORDER — INSULIN GLARGINE 100 UNIT/ML SC SOPN *I*
22.0000 [IU] | PEN_INJECTOR | Freq: Every evening | SUBCUTANEOUS | Status: DC
Start: 2014-04-12 — End: 2014-05-24

## 2014-04-12 NOTE — Progress Notes (Addendum)
[facility]    SUBJECTIVE    Pt is here to discuss:    Chief Complaint   Patient presents with    Diabetes    Follow-up     Urgent care s/p fall sent to [facility] ED 1 week ago    Other     Lipids       1. F/u syncope - Suffered concussion after episode of her neurocardiogenic syncope 11/30. Seen in ED, CT neg, heart monitor neg for arrythmia, given dilaudid and phenergan and d/c-ed home. Pt has since been taking it easy, minimal activity or screen time. Having headache and racing thoughts and difficulty sleeping. Added back TV yesterday and noticed not feeling well after a few hours.     2. F/u DM2 - Brings in log today. Fasting BGs still remain in 130s-140s. We have slowly been titrating up by 2U every 5-7d via mychart and she is currently on 20U. Denies sx of hypoglycemia.     3. Insomnia - Requesting Lorrin Mais again today, recalls that she had relief with sleep while taking it after her abdominal surgery. I had opted to not refill and asked that she speak about this with psychiatrist; she has not yet seen them since this time.    PMH / Family Hx / Social Hx  Patient's medications, allergies, problem list, past medical, social histories were reviewed and notable for:    Current Outpatient Prescriptions   Medication Sig Note    mirtazapine (REMERON) 7.5 MG tablet Take 1 tablet (7.5 mg total) by mouth nightly     zolpidem (AMBIEN CR) 6.25 MG CR tablet Take 1 tablet (6.25 mg total) by mouth nightly as needed for Sleep   Max daily dose: 6.25 mg Swallow whole. Do not crush, break, or chew.     VENTOLIN HFA 108 (90 BASE) MCG/ACT inhaler Inhale 1-2 puffs into the lungs every 4-6 hours as needed for Wheezing     gabapentin (NEURONTIN) 800 MG tablet TAKE 1 TABLET (800 MG TOTAL) BY MOUTH 3 TIMES DAILY     diazepam (VALIUM) 5 MG tablet TAKE 1 TABLET (5 MG TOTAL) BY MOUTH 2 TIMES DAILY AS NEEDED FOR ANXIETY MAX DAILY DOSE: 10 MG     lidocaine (LIDODERM) 5 % patch  03/02/2014: Received from: External Pharmacy     lurasidone (LATUDA) 60 MG tablet Take 1 tablet (60 mg total) by mouth daily     naproxen sodium (ANAPROX) 550 MG tablet TAKE 1 TABLET (550 MG TOTAL) BY MOUTH 2 TIMES DAILY (WITH MEALS)     busPIRone (BUSPAR) 15 MG tablet Take 1 tablet (15 mg total) by mouth 2 times daily     omeprazole (PRILOSEC) 20 MG capsule TAKE 1 CAPSULE (20 MG TOTAL) BY MOUTH DAILY (BEFORE BREAKFAST)     nystatin (MYCOSTATIN) cream Apply topically 2 times daily     insulin glargine (LANTUS SOLOSTAR) 100 UNIT/ML injection pen Inject 10 Units into the skin nightly (Patient taking differently: Inject 20 Units into the skin nightly   )     insulin pen needle (NOVOFINE) 30G X 8 MM Use 1 times a day as instructed.     Melatonin 5 MG CAPS Take 10 mg by mouth nightly     Alcohol Swabs (ALCOHOL WIPES) PADS Use BID for BG check     SUMAtriptan (IMITREX) 50 MG tablet Take 1 tablet (50 mg total) by mouth as needed for Migraine   Take at onset of headache. May repeat once in 2  hours.     fluticasone (FLONASE) 50 MCG/ACT nasal spray 1 spray by Nasal route daily     cyclobenzaprine (FLEXERIL) 5 MG tablet TAKE 1 TABLET (5 MG TOTAL) BY MOUTH 3 TIMES DAILY AS NEEDED FOR MUSCLE SPASMS     oxyCODONE-acetaminophen (PERCOCET) 5-325 MG per tablet Take 1 tablet by mouth every 6 hours as needed for Pain   Max daily dose: 4 tablets     midodrine (PROAMATINE) 5 MG tablet Take 5 mg by mouth 2 times daily     DULoxetine (CYMBALTA) 60 MG capsule Take 1 capsule (60 mg total) by mouth daily     dicyclomine (BENTYL) 20 MG tablet  08/18/2013: Received from: External Pharmacy    topiramate (TOPAMAX) 50 MG tablet Take 1 tablet (50 mg total) by mouth 2 times daily     lancets Brand Free Style Lite; Use 2 times per day as directed for blood glucose testing.     FREESTYLE LITE test strip Use BID as directed for 250.02     famciclovir (FAMVIR) 500 MG tablet Take 1 tablet (500 mg total) by mouth 3 times daily as needed     blood glucose monitor system Brand:  cheapest brand available per her insurance.  Use as directed.     atorvastatin (LIPITOR) 40 MG tablet Take 40 mg by mouth daily (with dinner)     ranolazine (RANEXA) 500 MG 12 hr tablet Take 500 mg by mouth 2 times daily   Swallow whole. Do not crush, break, or chew.     Non-System Medication The above patient is followed in our clinic and cannot resume work permanently.     ondansetron (ZOFRAN) 8 MG tablet Take 1 tablet (8 mg total) by mouth 3 times daily as needed for Nausea        ROS  Per HPI    OBJECTIVE  Filed Vitals:    04/12/14 1434   BP: 132/88   Pulse: 72   Temp: 36.6 C (97.8 F)   TempSrc: Temporal   Height: 1.7 m (5' 6.93")   Weight: 134.809 kg (297 lb 3.2 oz)     Body mass index is 46.65 kg/(m^2).      General: well-appearing obese Caucasian female, pleasant & conversant, in NAD, here with wife Shari  HENT: NC/AT   Psych: AAOx3, normal affect and mood. Insight and judgement intact.           ASSESSMENT & PLAN  1. Concussion  2. Neurocardiogenic syncope  Recommended continued cognitive and physical rest. Pt is already on plenty of psych meds where I would feel uncomfortable starting amitriptyline without input from psychiatry, which has been shown to help with post concussive sx of headache, insomnia, mood changes. I also suspect a high degree of somatization with her, and therefore encouraged patience and non-pharm ways of managing her sx to avoid excessive side effects of polypharm.     3. Diabetes mellitus type 2 in obese  Increase lantus to 22U, f/u via mychart in 1 week. Will plan to recheck A1c before going up any further given the drastic increase from 10 to 22U over the last few weeks.  - Hemoglobin A1c; Future  - Lipid add Rfx to Drt LDL if Trig >400; Future    4. Insomnia  Likely exacerbated by concussion. Again deferred addition of ambien to her psych team.    Follow-up: 3 mos re: DM, sooner via [facility]      [name], MD  [place]  04/12/2014  2:59  PM        ______________________

## 2014-04-13 ENCOUNTER — Ambulatory Visit: Payer: Self-pay | Admitting: Clinical

## 2014-04-18 ENCOUNTER — Encounter: Payer: Self-pay | Admitting: Clinical

## 2014-04-18 ENCOUNTER — Telehealth: Payer: Self-pay

## 2014-04-18 DIAGNOSIS — F603 Borderline personality disorder: Secondary | ICD-10-CM

## 2014-04-18 NOTE — Telephone Encounter (Signed)
Patient said that she has a f/u with Cardiology.

## 2014-04-18 NOTE — Telephone Encounter (Signed)
Call to pt for [facility] ED f/u, no answer, left name and number for call back

## 2014-04-20 ENCOUNTER — Ambulatory Visit: Payer: Self-pay | Admitting: Clinical

## 2014-04-20 ENCOUNTER — Ambulatory Visit: Payer: Self-pay | Admitting: Ophthalmology

## 2014-04-20 VITALS — BP 164/72 | HR 74

## 2014-04-20 DIAGNOSIS — IMO0002 Reserved for concepts with insufficient information to code with codable children: Secondary | ICD-10-CM

## 2014-04-20 LAB — HM DIABETES EYE EXAM

## 2014-04-20 NOTE — Progress Notes (Signed)
Patient name: Kaylee Harris  DOB: Jul 07, 1975       Age: 38 y.o.  MR#: [id number]    Encounter Date: 04/20/2014    Subjective:     Chief Complaint: No chief complaint on file.    HPI     70yF with h/o DM, no DR on 08/2013 exam, choroidal nevus noted on last   exam and photo taken. She is he for 6 month check of nevus.    BG 160-190s, no on insulin                       HA1C                     6.7*                01/26/2014         History of falls/head trauma/concussion due to syncopal episodes from   neurocardiogenic syncope, recent fall last week and has a cut on her   forehead.             Current Outpatient Rx   Name  Route  Sig  Dispense  Refill    insulin glargine (LANTUS SOLOSTAR) 100 UNIT/ML injection pen    Subcutaneous    Inject 22 Units into the skin nightly    3 each    3      mirtazapine (REMERON) 7.5 MG tablet    Oral    Take 1 tablet (7.5 mg total) by mouth nightly    30 tablet    1      zolpidem (AMBIEN CR) 6.25 MG CR tablet    Oral    Take 1 tablet (6.25 mg total) by mouth nightly as needed for Sleep   Max daily dose: 6.25 mg Swallow whole. Do not crush, break, or chew.    30 tablet    1      VENTOLIN HFA 108 (90 BASE) MCG/ACT inhaler    Inhalation    Inhale 1-2 puffs into the lungs every 4-6 hours as needed for Wheezing    1 Inhaler    5       Dispense as written.      gabapentin (NEURONTIN) 800 MG tablet        TAKE 1 TABLET (800 MG TOTAL) BY MOUTH 3 TIMES DAILY    90 tablet    2      diazepam (VALIUM) 5 MG tablet        TAKE 1 TABLET (5 MG TOTAL) BY MOUTH 2 TIMES DAILY AS NEEDED FOR ANXIETY MAX DAILY DOSE: 10 MG    6 tablet    0       Not to exceed 5 additional fills before 07/26/2014      lidocaine (LIDODERM) 5 % patch                5      lurasidone (LATUDA) 60 MG tablet    Oral    Take 1 tablet (60 mg total) by mouth daily    30 tablet    1      naproxen sodium (ANAPROX) 550 MG tablet        TAKE 1 TABLET (550 MG TOTAL) BY MOUTH 2 TIMES DAILY (WITH MEALS)    60 tablet    5      busPIRone  (BUSPAR) 15 MG tablet    Oral    Take 1 tablet (15  mg total) by mouth 2 times daily    60 tablet    2      omeprazole (PRILOSEC) 20 MG capsule        TAKE 1 CAPSULE (20 MG TOTAL) BY MOUTH DAILY (BEFORE BREAKFAST)    30 capsule    5      nystatin (MYCOSTATIN) cream    Topical    Apply topically 2 times daily    15 g    0      insulin pen needle (NOVOFINE) 30G X 8 MM        Use 1 times a day as instructed.    100 each    3      Melatonin 5 MG CAPS    Oral    Take 10 mg by mouth nightly    60 each    0      Alcohol Swabs (ALCOHOL WIPES) PADS        Use BID for BG check    100 each    1      SUMAtriptan (IMITREX) 50 MG tablet    Oral    Take 1 tablet (50 mg total) by mouth as needed for Migraine   Take at onset of headache. May repeat once in 2 hours.    9 tablet    5      fluticasone (FLONASE) 50 MCG/ACT nasal spray    Nasal    1 spray by Nasal route daily    16 g    5       Created by Conversion      cyclobenzaprine (FLEXERIL) 5 MG tablet        TAKE 1 TABLET (5 MG TOTAL) BY MOUTH 3 TIMES DAILY AS NEEDED FOR MUSCLE SPASMS    90 tablet    0      oxyCODONE-acetaminophen (PERCOCET) 5-325 MG per tablet    Oral    Take 1 tablet by mouth every 6 hours as needed for Pain   Max daily dose: 4 tablets    15 tablet    0      midodrine (PROAMATINE) 5 MG tablet    Oral    Take 5 mg by mouth 2 times daily              DULoxetine (CYMBALTA) 60 MG capsule    Oral    Take 1 capsule (60 mg total) by mouth daily    60 capsule    2      dicyclomine (BENTYL) 20 MG tablet                5      topiramate (TOPAMAX) 50 MG tablet    Oral    Take 1 tablet (50 mg total) by mouth 2 times daily    60 tablet    5      lancets        Brand Free Style Lite; Use 2 times per day as directed for blood glucose testing.    100 each    2      FREESTYLE LITE test strip        Use BID as directed for 250.02    100 each    2      famciclovir (FAMVIR) 500 MG tablet    Oral    Take 1 tablet (500 mg total) by mouth 3 times daily as needed    30  tablet    5  blood glucose monitor system        Brand: cheapest brand available per her insurance.  Use as directed.    1 kit    0      atorvastatin (LIPITOR) 40 MG tablet    Oral    Take 40 mg by mouth daily (with dinner)              ranolazine (RANEXA) 500 MG 12 hr tablet    Oral    Take 500 mg by mouth 2 times daily   Swallow whole. Do not crush, break, or chew.              Non-System Medication        The above patient is followed in our clinic and cannot resume work permanently.    1 each    1      ondansetron (ZOFRAN) 8 MG tablet    Oral    Take 1 tablet (8 mg total) by mouth 3 times daily as needed for Nausea    90 tablet    5        Allergies: Morphine and Trazodone     Medical History:   Past Medical History   Diagnosis Date    Diabetes mellitus      Previously on SU and metformin, now diet controlled    Asthma     Allergy history unknown     GERD (gastroesophageal reflux disease)     Dysfunctional uterine bleeding     Diverticulitis 09/2010    Sebaceous cyst of breast      right axilla    Anginal pain     Hyperlipidemia     Varicella     Complication of anesthesia     Arthritis         Surgical History:   Past Surgical History   Procedure Laterality Date    Dilation and curettage of uterus  2005, 2007     x2 for menorraghia    Tonsillectomy      Cardiac catheterization  09/2011     negative    Loop recorder  Feb 03 2014    Arthroscopic shoulder surgery Right 2010     Related to lifting    Left colectomy  01/03/14     Dr Tresa Res        ROS     Negative for: Constitutional, Gastrointestinal, Neurological, Skin,   Genitourinary, Musculoskeletal, HENT, Endocrine, Cardiovascular, Eyes,   Respiratory, Psychiatric, Allergic/Imm, Heme/Lymph      Objective:     Base Eye Exam     Visual Acuity (Snellen - Linear)      Right Left   Dist cc 20/20 20/20-2   Near cc J1+ J1+       Correction:  Glasses      Tonometry (Tonopen, 4:07 PM)      Right Left   Pressure 11 12         Pupils       Pupils   Right PERRLA   Left PERRLA         Neuro/Psych     Oriented x3:  Yes    Mood/Affect:  Normal      Dilation     Both eyes:  2.5% Phenylephrine, 1.0% Tropicamide @ 5:02 PM            Slit Lamp and Fundus Exam     External Exam      Right Left    External  Normal ocular adnexae, lacrimal gland & drainage, orbits Normal ocular adnexae, lacrimal gland & drainage, orbits      Slit Lamp Exam      Right Left    Lids/Lashes Normal structure & position Normal structure & position    Conjunctiva/Sclera Normal bulbar/palpebral, conjunctiva, sclera Normal bulbar/palpebral, conjunctiva, sclera    Cornea Normal epithelium, stroma, endothelium, tear film Normal epithelium, stroma, endothelium, tear film    Anterior Chamber Clear & deep Clear & deep    Iris Normal shape, size, morphology Normal shape, size, morphology    Lens Normal cortex, nucleus, anterior/posterior capsule, clarity Normal cortex, nucleus, anterior/posterior capsule, clarity    Vitreous Clear Clear      Fundus Exam      Right Left    Disc Normal size, appearance, nerve fiber layer Normal size, appearance, nerve fiber layer    C/D Ratio 0.1 0.1    Macula Normal Normal    Vessels Normal Normal    Periphery Normal 2 DD choroidal nevus along STA with overlying drusen, no SRF or orange pigment            Refraction     Wearing Rx      Sphere Cylinder Axis Add   Right +0.50 +0.50 18 +1.50   Left +0.50 +1.50 155 +1.50       Age:  7 mos    Type:  Bifocal                      No annotated images are attached to the encounter.      Assessment/Plan:     DM (diabetes mellitus), type 2, uncontrolled  Diabetes   - No diabetic retinopathy   - Control blood glucose / blood pressure, goal HbA1C < 7.0   - PCP to be CC'd on today's note    Choroidal Nevus, Left Eye  Small, flat, with druse  No high risk features  Similar appearance to photo taken 6 months ago.  -Recheck in 12 months            This patient has been seen by Dr. Lorin Mercy     Lona Kettle, MD PhD  PGY3,  Ophthalmology  [place]  [facility]  I have seen and examined the patient and agree with residents assessment and plan as documented above.    Forde Radon, MD

## 2014-04-20 NOTE — Assessment & Plan Note (Signed)
Diabetes   - No diabetic retinopathy   - Control blood glucose / blood pressure, goal HbA1C < 7.0   - PCP to be CC'd on today's note    Choroidal Nevus, Left Eye  Small, flat, with druse  No high risk features  Similar appearance to photo taken 6 months ago.  -Recheck in 12 months

## 2014-04-22 ENCOUNTER — Encounter: Payer: Self-pay | Admitting: Gastroenterology

## 2014-04-23 ENCOUNTER — Encounter: Payer: Self-pay | Admitting: Gastroenterology

## 2014-04-25 ENCOUNTER — Encounter: Payer: Self-pay | Admitting: Gastroenterology

## 2014-04-25 ENCOUNTER — Telehealth: Payer: Self-pay

## 2014-04-25 NOTE — Telephone Encounter (Signed)
Call to pt for [facility] ED F/U. No answer, left name and number for call back

## 2014-04-26 ENCOUNTER — Other Ambulatory Visit: Payer: Self-pay | Admitting: Psychiatry

## 2014-04-26 ENCOUNTER — Encounter: Payer: Self-pay | Admitting: Primary Care

## 2014-04-26 NOTE — Telephone Encounter (Signed)
Call placed again for f/u, no answer, left name and number for call back. Spoke with PCP, states chronic issue and pt to f/u with neurology

## 2014-04-27 ENCOUNTER — Telehealth: Payer: Self-pay

## 2014-04-27 NOTE — Telephone Encounter (Signed)
Pt was evaluated for PT but she is doing really well and Alyse Low will not be picking her up at this time, but wanted to let Dr. Laurance Flatten know that pt is complaining that her pain is 7/10 headache pain from her most recent fall. If you have any questions or concerns please call Christy at the number listed above.

## 2014-04-27 NOTE — Telephone Encounter (Signed)
Please call patient to check on her.  See note from [place] about her headache -- she was seen by Dr Laurance Flatten due to concussion 2 weeks ago.    Jennelle Human, MD

## 2014-04-27 NOTE — Telephone Encounter (Signed)
Dr Kerin Ransom calling from Cardiology. He would like to speak to the doctor about the patients medications. I did let him know that Dr. Laurance Flatten is out of the office. He asked that I just send a message then. He said it wasn't urgent. Please give him a call back on his direct line at [phone]

## 2014-04-28 NOTE — Telephone Encounter (Signed)
Left message on machine for pt to return call and MyChart also sent.

## 2014-05-01 ENCOUNTER — Inpatient Hospital Stay
Admission: EM | Admit: 2014-05-01 | Disposition: A | Discharge status: Home / Self Care | Payer: Self-pay | Source: Ambulatory Visit | Attending: General Practice | Admitting: General Practice

## 2014-05-01 ENCOUNTER — Encounter: Payer: Self-pay | Admitting: Emergency Medicine

## 2014-05-01 ENCOUNTER — Other Ambulatory Visit: Payer: Self-pay | Admitting: Gastroenterology

## 2014-05-01 LAB — PLASMA PROF 7 (ED ONLY)
Anion Gap,PL: 14 (ref 7–16)
CO2,Plasma: 18 mmol/L — ABNORMAL LOW (ref 20–28)
Chloride,Plasma: 105 mmol/L (ref 96–108)
Creatinine: 0.78 mg/dL (ref 0.51–0.95)
GFR,Black: 110 *
GFR,Caucasian: 95 *
Glucose,Plasma: 214 mg/dL — ABNORMAL HIGH (ref 60–99)
Potassium,Plasma: 4.5 mmol/L (ref 3.4–4.7)
Sodium,Plasma: 137 mmol/L (ref 132–146)
UN,Plasma: 11 mg/dL (ref 6–20)

## 2014-05-01 LAB — CBC AND DIFFERENTIAL
Baso # K/uL: 0.1 10*3/uL (ref 0.0–0.1)
Basophil %: 0.7 %
Eos # K/uL: 0.2 10*3/uL (ref 0.0–0.4)
Eosinophil %: 1.2 %
Hematocrit: 38 % (ref 34–45)
Hemoglobin: 12.4 g/dL (ref 11.2–15.7)
IMM Granulocytes #: 0.1 10*3/uL (ref 0.0–0.1)
IMM Granulocytes: 0.7 %
Lymph # K/uL: 3.4 10*3/uL (ref 1.2–3.7)
Lymphocyte %: 18.3 %
MCH: 28 pg/cell (ref 26–32)
MCHC: 33 g/dL (ref 32–36)
MCV: 87 fL (ref 79–95)
Mono # K/uL: 0.8 10*3/uL (ref 0.2–0.9)
Monocyte %: 4.3 %
Neut # K/uL: 14 10*3/uL — ABNORMAL HIGH (ref 1.6–6.1)
Nucl RBC # K/uL: 0 10*3/uL (ref 0.0–0.0)
Nucl RBC %: 0 /100 WBC (ref 0.0–0.2)
Platelets: 488 10*3/uL — ABNORMAL HIGH (ref 160–370)
RBC: 4.4 MIL/uL (ref 3.9–5.2)
RDW: 13.1 % (ref 11.7–14.4)
Seg Neut %: 74.8 %
WBC: 18.7 10*3/uL — ABNORMAL HIGH (ref 4.0–10.0)

## 2014-05-01 LAB — HOLD BLUE

## 2014-05-01 LAB — ACETAMINOPHEN LEVEL: Acetaminophen: <2 ug/mL

## 2014-05-01 LAB — SALICYLATE LEVEL: Salicylate: <2 mg/dL — ABNORMAL LOW (ref 15.0–30.0)

## 2014-05-01 LAB — ETHANOL: Ethanol: <10 mg/dL

## 2014-05-01 MED ORDER — PRAZOSIN HCL 5 MG PO CAPS *I*
5.0000 mg | ORAL_CAPSULE | Freq: Once | ORAL | Status: AC
Start: 2014-05-01 — End: 2014-05-01
  Administered 2014-05-01: 5 mg via ORAL
  Filled 2014-05-01: qty 1

## 2014-05-01 MED ORDER — DIAZEPAM 5 MG/ML IJ SOLN *I*
10.0000 mg | Freq: Once | INTRAMUSCULAR | Status: AC
Start: 2014-05-01 — End: 2014-05-01
  Administered 2014-05-01: 10 mg via INTRAVENOUS
  Filled 2014-05-01: qty 2

## 2014-05-01 NOTE — ED Provider Progress Notes (Signed)
ED Provider Progress Note    Pt was signed out to me by Dr. Verlin Fester. Pt was seen and examined. She is sinus bradycardia with a rate of 30- low 40's after midodrine ingestion. She complains of light headedness. Recommendations provided by Dr. Verlin Fester from toxicology; patient received 5 mg of PO prazosin and 10 mg of IV valium and also recommended not administering atropine. There are no acute interventions needed at this time. MICU consulted and will come down to see the patient. Will continue to reassess and follow.         Samuel Jester, MD, 05/01/2014, 11:56 PM

## 2014-05-01 NOTE — ED Notes (Signed)
Pt presents to ED after taking approx 40 tablets of midodrine about 1 hour ago. Pt states she recently "has been passing out a lot, and has become a burden to wife". Pt states hx of suicide attempts x2 in the past. Pt c/o HA and some facial numbness. Denies CP, SOB, N/V or numbness/weakness in extremities. Pt in gown, belongings in CCB closet. Green socked per [employer]. Wife and sister at bedside. Pt placed on telemetry for monitoring.

## 2014-05-01 NOTE — ED Notes (Addendum)
Pt took 40 5mg  tabs of midodrine at 1930 in suicide attempt.

## 2014-05-01 NOTE — ED Provider Notes (Addendum)
History     Chief Complaint   Patient presents with    Drug Overdose       HPI Comments: 38 y.o. female w/ pmh of recurrent syncope, hypotension presents to the ED with intentional ingestion. Pt took 40 5mg  tabs of midodrine at 6:30 PM in suicide attempt. Pt states she did this intentionally in suicide attempt because "she is a burden to wife". Recurrent syncope, last episode yesterday. Falls every week and feels this is a burden for wife. Hx SA x3, all ingestions. Denies other ingestion. Denies Etoh or illicit drug use. Pt c/o HA and some facial numbness. Denies CP, SOB, visual deficit, N/V. [facility].       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 for income:  Not work related      Past Medical History   Diagnosis Date    Diabetes mellitus      Previously on SU and metformin, now diet controlled    Asthma     Allergy history unknown     GERD (gastroesophageal reflux disease)     Dysfunctional uterine bleeding     Diverticulitis 09/2010    Sebaceous cyst of breast      right axilla    Anginal pain     Hyperlipidemia     Varicella     Complication of anesthesia     Arthritis             Past Surgical History   Procedure Laterality Date    Dilation and curettage of uterus  2005, 2007     x2 for menorraghia    Tonsillectomy      Cardiac catheterization  09/2011     negative    Loop recorder  Feb 03 2014    Arthroscopic shoulder surgery Right 2010     Related to lifting    Left colectomy  01/03/14     Dr Tresa Res       Family History   Problem Relation Age of Onset    Hypertension Father     Diabetes Father     Kidney disease Father     Elevated lipids Father     Heart attack Father 64    Hypertension Mother     Elevated lipids Mother     Heart attack Mother 25    Diabetes Mother     Eczema Mother     Psoriasis Mother     Hypertension Brother     Heart disease Brother 74     prinzmetal's  angina    Heart disease Sister      currently having work up    Home Sister     Hypertension Sister     Breast cancer Maternal Grandmother     Stroke           Social History      reports that she has quit smoking. Her smoking use included Cigarettes. She has a 1.5 pack-year smoking history. She has never used smokeless tobacco. She reports that she drinks alcohol. She reports that she currently engages in sexual activity and has had female partners. She reports that she does not use illicit drugs.    Living Situation     Questions Responses    Patient lives with Significant Other    Comment: Lives with Wife, [name]     Homeless No    Caregiver  for other family member No    External Services Mental Health Services    Comment: [place]     Employment Disabled    Domestic Violence Risk No          Problem List     Patient Active Problem List   Diagnosis Code    DM (diabetes mellitus), type 2, uncontrolled E11.65    Migraine with aura G43.109    Asthma J45.909    Hyperlipidemia E78.5    Fibromyalgia M79.7    Neurocardiogenic syncope R55    Major depression, recurrent F33.9    IUD (intrauterine device) in place Z97.5    Skin lesion of face L98.9    IBS (irritable bowel syndrome) K58.9    Onychomycosis B35.1    Anxiety F41.9    OCD (obsessive compulsive disorder) F42    Carpal tunnel 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   Respiratory: Negative for shortness of breath.    Cardiovascular: Negative for chest pain.   Gastrointestinal: Negative for nausea, vomiting, abdominal pain and diarrhea.   Endocrine: Negative for polyuria.   Genitourinary: Negative for dysuria, hematuria and flank pain.   Musculoskeletal: Negative for back pain and neck pain.   Skin: Negative for wound.    Allergic/Immunologic: Negative for immunocompromised state.   Neurological: Positive for syncope, light-headedness and headaches. Negative for weakness.   Psychiatric/Behavioral: Positive for suicidal ideas and self-injury. Negative for hallucinations and confusion.       Physical Exam       ED Triage Vitals   BP Heart Rate Heart Rate (via Pulse Ox) Resp Temp Temp src SpO2 O2 Device O2 Flow Rate   05/01/14 2041 05/01/14 2041 -- 05/01/14 2041 05/01/14 2041 -- 05/01/14 2041 05/01/14 2041 --   191/94 mmHg 50  12 36.2 C (97.2 F)  97 % None (Room air)       Weight           05/01/14 2041           134.265 kg (296 lb)               Physical Exam   Constitutional: She is oriented to person, place, and time. She appears well-developed and well-nourished. No distress.   HENT:   Head: Normocephalic and atraumatic.   Eyes: Conjunctivae and EOM are normal. Pupils are equal, round, and reactive to light. No scleral icterus.   Pupils 61mm and reactive b/l   Neck: No tracheal deviation present.   Cardiovascular: Regular rhythm, normal heart sounds and intact distal pulses.  Bradycardia present.  Exam reveals no gallop and no friction rub.    No murmur heard.  Pulmonary/Chest: Effort normal and breath sounds normal. No stridor. No respiratory distress. She has no wheezes. She has no rales. She exhibits no tenderness.   Abdominal: Soft. Bowel sounds are normal. She exhibits no distension. There is no tenderness. There is no rebound and no guarding.   Musculoskeletal: She exhibits no edema or tenderness.   Neurological: She is alert and oriented to person, place, and time. She has normal strength. No cranial nerve deficit or sensory deficit. GCS eye subscore is 4. GCS verbal subscore is 5. GCS motor subscore is 6.  No clonus or hyperreflexia.   Skin: Skin is warm and dry. She is not diaphoretic.   Psychiatric: Her behavior is normal. Thought content normal.   Flat affect     Nursing note and vitals reviewed.      Medical Decision  Making        Initial Evaluation:  ED First Provider Contact     Date/Time Event User Comments    05/01/14 2100 ED Provider First Contact [employer], MATTHEW Initial Face to Face Provider Contact          Patient seen by me as above    Assessment:  38 y.o., female comes to the ED with Midodrine overdose.  Patient is bradycardic and hypertensive.   Neurologic exam is nonfocal.    Differential Diagnosis includes midodrine overdose, toxic ingestion, suicide attempt, acute kidney injury, electrolyte abnormality,  mental illness                Plan:   Diagnostic: Telemetry, ECG, CBC, ED 7, UA, ethanol level, acetaminophen level, salicylate level, toxicology consult    Disposition: Pending above diagnostics and toxicology recommendations        Michele Mcalpine, MD              Michele Mcalpine, MD  Resident  05/01/14 2234  Resident Attestation:     Patient seen by me today, 05/01/2014 at 2230    History:   I reviewed this patient, reviewed the resident's note and agree.  Exam:   I examined this patient, reviewed the resident's note and agree, with edits as above.    Decision Making:   I discussed with the resident his/her documented decision making  and agree.      Author Zakyah Yanes Hiram Gash, MD      Mckinley Jewel, MD  05/01/14 2250

## 2014-05-02 ENCOUNTER — Ambulatory Visit: Payer: Self-pay | Admitting: Psychiatry

## 2014-05-02 ENCOUNTER — Telehealth: Payer: Self-pay

## 2014-05-02 DIAGNOSIS — R45851 Suicidal ideations: Secondary | ICD-10-CM | POA: Diagnosis present

## 2014-05-02 DIAGNOSIS — T50901A Poisoning by unspecified drugs, medicaments and biological substances, accidental (unintentional), initial encounter: Secondary | ICD-10-CM

## 2014-05-02 LAB — CBC AND DIFFERENTIAL
Baso # K/uL: 0.1 10*3/uL (ref 0.0–0.1)
Basophil %: 0.8 %
Eos # K/uL: 0.3 10*3/uL (ref 0.0–0.4)
Eosinophil %: 2.1 %
Hematocrit: 33 % — ABNORMAL LOW (ref 34–45)
Hemoglobin: 10.7 g/dL — ABNORMAL LOW (ref 11.2–15.7)
IMM Granulocytes #: 0.1 10*3/uL (ref 0.0–0.1)
IMM Granulocytes: 1 %
Lymph # K/uL: 3.2 10*3/uL (ref 1.2–3.7)
Lymphocyte %: 25.6 %
MCH: 29 pg/cell (ref 26–32)
MCHC: 32 g/dL (ref 32–36)
MCV: 89 fL (ref 79–95)
Mono # K/uL: 0.7 10*3/uL (ref 0.2–0.9)
Monocyte %: 5.1 %
Neut # K/uL: 8.3 10*3/uL — ABNORMAL HIGH (ref 1.6–6.1)
Nucl RBC # K/uL: 0 10*3/uL (ref 0.0–0.0)
Nucl RBC %: 0 /100 WBC (ref 0.0–0.2)
Platelets: 319 10*3/uL (ref 160–370)
RBC: 3.7 MIL/uL — ABNORMAL LOW (ref 3.9–5.2)
RDW: 13.2 % (ref 11.7–14.4)
Seg Neut %: 65.4 %
WBC: 12.7 10*3/uL — ABNORMAL HIGH (ref 4.0–10.0)

## 2014-05-02 LAB — URINALYSIS WITH MICROSCOPIC
Blood,UA: NEGATIVE
Hyaline Casts,UA: 3 /lpf — AB (ref 0–2)
Ketones, UA: NEGATIVE
Leuk Esterase,UA: NEGATIVE
Nitrite,UA: NEGATIVE
Protein,UA: NEGATIVE mg/dL
RBC,UA: <1 /hpf (ref 0–2)
Specific Gravity,UA: 1.016 (ref 1.002–1.030)
WBC,UA: <1 /hpf (ref 0–5)
pH,UA: 6 (ref 5.0–8.0)

## 2014-05-02 LAB — TROPONIN T
Troponin T: <0.01 ng/mL (ref 0.00–0.02)
Troponin T: <0.01 ng/mL (ref 0.00–0.02)

## 2014-05-02 LAB — BASIC METABOLIC PANEL
Anion Gap: 11 (ref 7–16)
CO2: 22 mmol/L (ref 20–28)
Calcium: 8.3 mg/dL — ABNORMAL LOW (ref 8.8–10.2)
Chloride: 107 mmol/L (ref 96–108)
Creatinine: 0.85 mg/dL (ref 0.51–0.95)
GFR,Black: 99 *
GFR,Caucasian: 86 *
Glucose: 178 mg/dL — ABNORMAL HIGH (ref 60–99)
Lab: 14 mg/dL (ref 6–20)
Potassium: 4 mmol/L (ref 3.3–5.1)
Sodium: 140 mmol/L (ref 133–145)

## 2014-05-02 LAB — HEPATIC FUNCTION PANEL
ALT: 19 U/L (ref 0–35)
AST: 12 U/L (ref 0–35)
Albumin: 3.6 g/dL (ref 3.5–5.2)
Alk Phos: 75 U/L (ref 35–105)
Bilirubin,Direct: <0.2 mg/dL (ref 0.0–0.3)
Bilirubin,Total: <0.2 mg/dL (ref 0.0–1.2)
Total Protein: 5.7 g/dL — ABNORMAL LOW (ref 6.3–7.7)

## 2014-05-02 LAB — POCT GLUCOSE: Glucose POCT: 136 mg/dL — ABNORMAL HIGH (ref 60–99)

## 2014-05-02 LAB — CK ISOENZYMES
CK: 59 U/L (ref 34–145)
Mass CKMB: <1 ng/mL (ref 0.0–5.3)

## 2014-05-02 LAB — HOLD BLUE

## 2014-05-02 MED ORDER — RANOLAZINE 500 MG PO TB12 *I*
500.0000 mg | ORAL_TABLET | Freq: Two times a day (BID) | ORAL | Status: DC
Start: 2014-05-02 — End: 2014-05-02
  Administered 2014-05-02: 500 mg via ORAL
  Filled 2014-05-02 (×2): qty 1

## 2014-05-02 MED ORDER — DIAZEPAM 5 MG PO TABS *I*
5.0000 mg | ORAL_TABLET | Freq: Two times a day (BID) | ORAL | Status: DC | PRN
Start: 2014-05-02 — End: 2014-05-02
  Administered 2014-05-02: 5 mg via ORAL
  Filled 2014-05-02: qty 1

## 2014-05-02 MED ORDER — MIDODRINE HCL 5 MG PO TABS *I*
5.0000 mg | ORAL_TABLET | Freq: Three times a day (TID) | ORAL | Status: DC
Start: 2014-05-02 — End: 2014-05-17

## 2014-05-02 MED ORDER — INSULIN GLARGINE 100 UNIT/ML SC SOLN *WRAPPED*
22.0000 [IU] | Freq: Every evening | SUBCUTANEOUS | Status: DC
Start: 2014-05-02 — End: 2014-05-02

## 2014-05-02 MED ORDER — OMEPRAZOLE 20 MG PO CPDR *I*
20.0000 mg | DELAYED_RELEASE_CAPSULE | Freq: Every morning | ORAL | Status: DC
Start: 2014-05-02 — End: 2014-05-02
  Administered 2014-05-02: 20 mg via ORAL
  Filled 2014-05-02: qty 1

## 2014-05-02 MED ORDER — BUSPIRONE HCL 5 MG PO TABS *I*
15.0000 mg | ORAL_TABLET | Freq: Two times a day (BID) | ORAL | Status: DC
Start: 2014-05-02 — End: 2014-05-02
  Administered 2014-05-02: 15 mg via ORAL
  Filled 2014-05-02 (×3): qty 1

## 2014-05-02 MED ORDER — GABAPENTIN 300 MG PO CAPSULE *I*
800.0000 mg | ORAL_CAPSULE | Freq: Three times a day (TID) | ORAL | Status: DC
Start: 2014-05-02 — End: 2014-05-02
  Administered 2014-05-02: 800 mg via ORAL
  Filled 2014-05-02 (×2): qty 2

## 2014-05-02 MED ORDER — ACETAMINOPHEN 325 MG PO TABS *I*
650.0000 mg | ORAL_TABLET | ORAL | Status: DC | PRN
Start: 2014-05-02 — End: 2014-05-02
  Administered 2014-05-02: 650 mg via ORAL
  Filled 2014-05-02: qty 2

## 2014-05-02 MED ORDER — CYCLOBENZAPRINE HCL 10 MG PO TABS *I*
5.0000 mg | ORAL_TABLET | Freq: Three times a day (TID) | ORAL | Status: DC | PRN
Start: 2014-05-02 — End: 2014-05-02

## 2014-05-02 MED ORDER — TOPIRAMATE 25 MG PO TABS *I*
50.0000 mg | ORAL_TABLET | Freq: Two times a day (BID) | ORAL | Status: DC
Start: 2014-05-02 — End: 2014-05-02
  Administered 2014-05-02: 50 mg via ORAL
  Filled 2014-05-02 (×2): qty 2

## 2014-05-02 MED ORDER — FAMCICLOVIR 500 MG PO TABS *I*
500.0000 mg | ORAL_TABLET | Freq: Three times a day (TID) | ORAL | Status: DC | PRN
Start: 2014-05-02 — End: 2014-05-02

## 2014-05-02 MED ORDER — DULOXETINE HCL 30 MG PO CPEP *I*
60.0000 mg | DELAYED_RELEASE_CAPSULE | Freq: Every day | ORAL | Status: DC
Start: 2014-05-02 — End: 2014-05-02
  Administered 2014-05-02: 60 mg via ORAL
  Filled 2014-05-02 (×2): qty 2

## 2014-05-02 MED ORDER — MIRTAZAPINE 15 MG PO TABS *I*
7.5000 mg | ORAL_TABLET | Freq: Every evening | ORAL | Status: DC
Start: 2014-05-02 — End: 2014-05-02
  Administered 2014-05-02: 7.5 mg via ORAL
  Filled 2014-05-02 (×2): qty 1

## 2014-05-02 MED ORDER — FLUTICASONE PROPIONATE 50 MCG/ACT NA SUSP *I*
1.0000 | Freq: Every day | NASAL | Status: DC
Start: 2014-05-02 — End: 2014-05-02
  Administered 2014-05-02: 1 via NASAL
  Filled 2014-05-02: qty 16

## 2014-05-02 MED ORDER — LURASIDONE HCL 40 MG PO TABS *I*
60.0000 mg | ORAL_TABLET | Freq: Every day | ORAL | Status: DC
Start: 2014-05-02 — End: 2014-05-02
  Filled 2014-05-02: qty 1

## 2014-05-02 MED ORDER — SODIUM CHLORIDE 0.9 % IV BOLUS *I*
1000.0000 mL | Freq: Once | Status: AC
Start: 2014-05-02 — End: 2014-05-02
  Administered 2014-05-02: 1000 mL via INTRAVENOUS

## 2014-05-02 MED ORDER — ENOXAPARIN SODIUM 40 MG/0.4ML IJ SOSY *I*
40.0000 mg | PREFILLED_SYRINGE | Freq: Two times a day (BID) | INTRAMUSCULAR | Status: DC
Start: 2014-05-02 — End: 2014-05-02
  Administered 2014-05-02: 40 mg via SUBCUTANEOUS
  Filled 2014-05-02 (×2): qty 0.4

## 2014-05-02 NOTE — Discharge Instructions (Addendum)
Brief Summary of Your Hospital Course (including key procedures and diagnostic test results):  You were admitted with severe depression and suicidal thinking. You were monitored closely and psychiatry evaluated you .  They recommended continuing therapy and you will start partial outpatient therapy tomorrow. Information provided regarding this.  Will be very important to have your meds managed by your spouse and locked up to help keep you safe while not feeling great.  Your spouse will supervise med administration. You were psychiatrically and medically cleared for discharge 05-02-14.      Follow up with Adult PHP program---- intake appointment 05/03/14 at 11:00am. [place] (brochure and further information given to you on discharge).   Support number for you is ----- [facility]  [phone] (local emergency contact number for psychiatric complaints that is staffed to provide counseling, send Mobile crisis unit, or refer to emergency authorities as appropriate)   You are advised to  call 911 or report to ED if feeling recurrent feelings of harming yourself or others.     Recommended diet: Resume your usual diet.    Recommended activity: activity as tolerated  Diabetes: continue to check blood sugars 4 times daily and keep a log, bring to PCP appointment.    If you experience any of these symptoms within the first 24 hours after discharge:feeling of severe anxiety, severe depression or unable to cope, chest pain, shortness of breath or any other urgent concerns,   please follow up with the discharge attending Dr. Lorin Mercy  at phone-number: [phone].    If you experience any of these symptoms 24 hours or more after discharge:same symptoms as above or any other urgent concerns,   please follow up with your PCP:  [name], MD [phone]

## 2014-05-02 NOTE — Discharge Summary (Addendum)
Name: Kaylee Harris MRN: [id number] DOB: 09/10/1975     Admit Date: 05/01/2014   Date of Discharge: 05/02/2014    Patient was accepted for discharge to   Home or Self Care [1]           )Discharge Attending Physician: Eulah Citizen, Marquett Bertoli      Hospitalization Summary    CONCISE NARRATIVE:   Midodrine overdose in a suicide attempt in a 38 y.o. female w/ a history of neurocardiogenic syncope, MDD/anxiety, bipolar disorder, multiple prior suicide attempts, fibromyalgia, and DM2.     Midodrine overdose: Blood pressure and HR stable, per psychiatry okay for her to resume midodrine as meds will be supervised/dispensed by her spouse.      Hx Neurocardiogenic syncope: can resume her midodrine supervised.    Psych-Psychiatry dx her with MDD-recurrent .Home regimen continued ( buspar, valium, Cymbalta, gabapentin, remeron, topiramate ). Phone Number given to her for lifeline if she needs it.  Psychiatry consulted, no indication for inpatient psych admission at this time. She will begin outpatient partial program tomorrow and all information given to patient. Intake appointment 05/03/14 at 11:00am. [place] (brochure and further information left on patient's paper).  Psychiatry spoke with spouse as she needs to lock up her meds and supervise med administration.      SIGNIFICANT MED CHANGES: None     [place]    Intensive Care    Medical Toxicology                  Signed: Coral Spikes, PA  On: 05/02/2014  at: 3:44 PM

## 2014-05-02 NOTE — Progress Notes (Signed)
Utilization Management    Level of Care Inpatient as of the date 05/02/2014      Tymeka Privette M Alexiya Franqui, RN     Pager: [phone]

## 2014-05-02 NOTE — Discharge Summary (Deleted)
Name: Kaylee Harris MRN: [id number] DOB: 10/17/1975     Admit Date: 05/01/2014   Date of Discharge: 05/02/2014    Patient was accepted for discharge to   Home or Self Care [1]           )Discharge Attending Physician: Eulah Citizen, AHMAD      Hospitalization Summary    CONCISE NARRATIVE:   Midodrine overdose in a suicide attempt in a 38 y.o. female w/ a history of neurocardiogenic syncope, MDD/anxiety, bipolar disorder, multiple prior suicide attempts, fibromyalgia, and DM2.     Midodrine overdose: Blood pressure and HR stable, per psychiatry okay for her to resume midodrine as meds will be supervised/dispensed by her spouse.      Hx Neurocardiogenic syncope: can resume her midodrine supervised.    Psych-Psychiatry dx her with MDD-recurrent .Home regimen continued ( buspar, valium, Cymbalta, gabapentin, remeron, topiramate ). Phone Number given to her for [facility] if she needs it.  Psychiatry consulted and cleared her psychiatrically. She will begin outpatient partial program tomorrow and all information given to patient. Intake appointment 05/03/14 at 11:00am. [place] (brochure and further information left on patient's paper).  Psychiatry spoke with spouse as she needs to lock up her meds and supervise med administration.          SIGNIFICANT MED CHANGES: None     CONSULTANT SERVICE    Psychiatry                  Signed: Coral Spikes, PA  On: 05/02/2014  at: 3:32 PM

## 2014-05-02 NOTE — H&P (Addendum)
MEDICINE HISTORY & PHYSICAL    SUBJECTIVE:  05/01/2014  8:40 PM    CC: midodrine overdose     HPI: Kaylee Harris is a 38 y.o. female with a PMH significant for neurocardiogenic syncope, MDD/anxiety, bipolar disorder, multiple prior suicide attempts, fibromyalgia, DM2, asthma who presents after taking forty tablets of midodrine 5 mg in suicide attempt at 6:30pm. Pt state she is a burden on her family due to her recurrent syncopal episodes from neurocardiogenic syncope and medical issues.   Upon presentation to the ED her BP was elevated to 191/94. Toxicology was consulted to recommended valium and prazosin. Pt also developed bradycardiac into the 30s as well. Currently pt endorses chest pain, fatigue, and lightheadedness. She denies any current SI/HI.     PMHx:  Past Medical History   Diagnosis Date    Diabetes mellitus      Previously on SU and metformin, now diet controlled    Asthma     Allergy history unknown     GERD (gastroesophageal reflux disease)     Dysfunctional uterine bleeding     Diverticulitis 09/2010    Sebaceous cyst of breast      right axilla    Anginal pain     Hyperlipidemia     Varicella     Complication of anesthesia     Arthritis        PSHx:  Past Surgical History   Procedure Laterality Date    Dilation and curettage of uterus  2005, 2007     x2 for menorraghia    Tonsillectomy      Cardiac catheterization  09/2011     negative    Loop recorder  Feb 03 2014    Arthroscopic shoulder surgery Right 2010     Related to lifting    Left colectomy  01/03/14     Dr Tresa Res       FHx:  Family History   Problem Relation Age of Onset    Hypertension Father     Diabetes Father     Kidney disease Father     Elevated lipids Father     Heart attack Father 16    Hypertension Mother     Elevated lipids Mother     Heart attack Mother 8    Diabetes Mother     Eczema Mother     Psoriasis Mother     Hypertension Brother     Heart disease Brother 66     prinzmetal's angina    Heart  disease Sister      currently having work up    Lamar Sister     Hypertension Sister     Breast cancer Maternal Grandmother     Stroke         SHx:  Lives w/: wife in town   Smoker: former  EtOH: rare  Recreational dx: no    Allergies:  is allergic to morphine and trazodone.    Medications:  Home Meds:   Prior to Admission medications    Medication Sig Start Date End Date Taking? Authorizing Provider   insulin glargine (LANTUS SOLOSTAR) 100 UNIT/ML injection pen Inject 22 Units into the skin nightly 04/12/14   [name], MD   mirtazapine (REMERON) 7.5 MG tablet Take 1 tablet (7.5 mg total) by mouth nightly 03/29/14   Corigliano, Suann Larry, NP   zolpidem (AMBIEN CR) 6.25 MG CR tablet Take 1 tablet (6.25 mg total) by mouth nightly as needed for Sleep  Max daily dose: 6.25 mg Swallow whole. Do not crush, break, or chew. 03/29/14   Corigliano, Suann Larry, NP   VENTOLIN HFA 108 (90 BASE) MCG/ACT inhaler Inhale 1-2 puffs into the lungs every 4-6 hours as needed for Wheezing 03/24/14   Jennelle Human, MD   gabapentin (NEURONTIN) 800 MG tablet TAKE 1 TABLET (800 MG TOTAL) BY MOUTH 3 TIMES DAILY 03/21/14   [name], MD   diazepam (VALIUM) 5 MG tablet TAKE 1 TABLET (5 MG TOTAL) BY MOUTH 2 TIMES DAILY AS NEEDED FOR ANXIETY MAX DAILY DOSE: 10 MG 03/14/14   [name], MD   lidocaine (LIDODERM) 5 % patch  01/25/14   [provider]   lurasidone (LATUDA) 60 MG tablet Take 1 tablet (60 mg total) by mouth daily 03/02/14   Corigliano, Suann Larry, NP   naproxen sodium (ANAPROX) 550 MG tablet TAKE 1 TABLET (550 MG TOTAL) BY MOUTH 2 TIMES DAILY (WITH MEALS) 02/21/14   [name], MD   busPIRone (BUSPAR) 15 MG tablet Take 1 tablet (15 mg total) by mouth 2 times daily 02/02/14   Corigliano, [name] D, NP   omeprazole (PRILOSEC) 20 MG capsule TAKE 1 CAPSULE (20 MG TOTAL) BY MOUTH DAILY (BEFORE BREAKFAST) 02/02/14   [name], MD   nystatin (MYCOSTATIN) cream Apply topically 2 times daily 01/27/14   [name], MD    insulin pen needle (NOVOFINE) 30G X 8 MM Use 1 times a day as instructed. 01/21/14   [name], MD   Melatonin 5 MG CAPS Take 10 mg by mouth nightly 01/18/14   Corigliano, Suann Larry, NP   Alcohol Swabs (ALCOHOL WIPES) PADS Use BID for BG check 01/18/14   [name], MD   SUMAtriptan (IMITREX) 50 MG tablet Take 1 tablet (50 mg total) by mouth as needed for Migraine   Take at onset of headache. May repeat once in 2 hours. 12/14/13   Lance Bosch, NP   fluticasone Asencion Islam) 50 MCG/ACT nasal spray 1 spray by Nasal route daily 12/13/13   [name], MD   cyclobenzaprine (FLEXERIL) 5 MG tablet TAKE 1 TABLET (5 MG TOTAL) BY MOUTH 3 TIMES DAILY AS NEEDED FOR MUSCLE SPASMS 12/06/13   Lance Bosch, NP   oxyCODONE-acetaminophen (PERCOCET) 5-325 MG per tablet Take 1 tablet by mouth every 6 hours as needed for Pain   Max daily dose: 4 tablets 11/26/13   [name], MD   midodrine (PROAMATINE) 5 MG tablet Take 5 mg by mouth 2 times daily    Trilby Leaver   DULoxetine (CYMBALTA) 60 MG capsule Take 1 capsule (60 mg total) by mouth daily 10/04/13   Corigliano, Suann Larry, NP   dicyclomine (BENTYL) 20 MG tablet  08/04/13   [provider]   topiramate (TOPAMAX) 50 MG tablet Take 1 tablet (50 mg total) by mouth 2 times daily 08/04/13   Smithlightfoot, Jana Half, NP   lancets Brand Free Style Lite; Use 2 times per day as directed for blood glucose testing. 08/04/13   [name], MD   FREESTYLE LITE test strip Use BID as directed for 250.02 08/04/13   [name], MD   famciclovir [facility]) 500 MG tablet Take 1 tablet (500 mg total) by mouth 3 times daily as needed 06/22/13   [name], MD   blood glucose monitor system Brand: cheapest brand available per her insurance.  Use as directed. 04/26/13   [name], MD   atorvastatin (LIPITOR) 40 MG tablet Take 40 mg by  mouth daily (with dinner)    [provider]   ranolazine (RANEXA) 500 MG 12 hr tablet Take 500 mg by mouth 2 times daily   Swallow whole. Do not  crush, break, or chew.    [provider]   Non-System Medication The above patient is followed in our clinic and cannot resume work permanently. 02/15/13   Lance Bosch, NP   ondansetron (ZOFRAN) 8 MG tablet Take 1 tablet (8 mg total) by mouth 3 times daily as needed for Nausea 01/18/13   Smithlightfoot, Jana Half, NP     Scheduled Meds:    sodium chloride 0.9%  bolus  1,000 mL Intravenous Once     Continuous Infusions:    PRN Meds:   PTA Meds:   (Not in a hospital admission)      ROS:   Review of Systems   Constitutional: Positive for malaise/fatigue. Negative for fever, chills, weight loss and diaphoresis.   HENT: Negative for congestion and sore throat.    Eyes: Negative for blurred vision, double vision, pain and discharge.   Respiratory: Negative for cough, sputum production, shortness of breath, wheezing and stridor.    Cardiovascular: Positive for chest pain. Negative for palpitations, orthopnea, claudication and leg swelling.   Gastrointestinal: Negative for heartburn, nausea, vomiting, abdominal pain and diarrhea.   Genitourinary: Negative for dysuria, urgency and frequency.   Musculoskeletal: Negative for myalgias and joint pain.   Skin: Negative for rash.   Neurological: Positive for dizziness. Negative for tingling, tremors, sensory change, focal weakness, seizures, loss of consciousness, weakness and headaches.   Endo/Heme/Allergies: Does not bruise/bleed easily.   Psychiatric/Behavioral: Positive for depression and suicidal ideas. Negative for hallucinations and substance abuse. The patient is not nervous/anxious.       OBJECTIVE:    Vital Signs:  BP: (98-191)/(50-94)   Temp:  [36.2 C (97.2 F)]   Temp src:  [-]   Heart Rate:  [36-79]   Resp:  [12-15]   SpO2:  [96 %-98 %]   Height:  [172.7 cm (5' 7.99")]   Weight:  [134.265 kg (296 lb)]   Patient Vitals for the past 72 hrs:   BP Temp Pulse Resp SpO2 Height Weight   05/02/14 0130 98/50 mmHg - 67 14 96 % - -   05/02/14 0045 - - 79 12 96 % - -    05/02/14 0000 130/72 mmHg - (!) 36 15 98 % - -   05/01/14 2041 (!) 191/94 mmHg 36.2 C (97.2 F) 50 12 97 % 1.727 m (5' 7.99") 134.265 kg (296 lb)       O2 Device: None (Room air) (05/01/14 2129)    No intake or output data in the 24 hours ending 05/02/14 0247  Weight:   Last 4 Weights    05/01/14 2041   Weight: 134.265 kg (296 lb)         Gen:  NAD, appears stated age   HENT: NC/AT, normal ears and nose, oral pharynx without lesions, MMM  Eyes: Anicteric, conjunctivae not injected, PERRLA, EOMI  Lungs: Clear to auscultation, no dullness to percusion  Heart: RRR no m/r/g  Abd: BS+, soft, NT, obese, no HSM  Ext: No e/c/c.  Neuro: A&Ox3, CN II-XII, strength full in UE and LE    Lab Results:    Labs reviewed, notable for:        No results for input(s): NA, K, CL, CO2, CREAT, CA, GLU in the last 72 hours.    No  components found with this basename: BUN  No results for input(s): MG, CA, PO4 in the last 168 hours.  Recent Labs      05/01/14   2222   WBC  18.7*   RBC  4.4   HEMOGLOBIN  12.4   HEMATOCRIT  38   MCV  87   RDW  13.1   PLATELETS  488*     No results for input(s): ALT, AST, INR in the last 168 hours.    No components found with this basename: ALKPHOS, BILITOT, BILIDIR, PROT, LABALBU, APT  No components found with this basename: CKTOTAL, TROPONINI, TROPONINT, CKMBINDEX  No results for input(s): PGLU in the last 168 hours.  HEMOGLOBIN A1C   Date Value Ref Range Status   01/26/2014 6.7* 4.0 - 6.0 % Final     Comment:                                   NEAR NORMAL GLYCEMIA          Telemetry/EKG: sinus bradycardia       Radiology:  No results found.  Pending Imaging:     ASSESSMENT/PLAN: This is a case of midodrine overdose in a suicide attempt in a 38 y.o. female w/ a history of neurocardiogenic syncope, MDD/anxiety, bipolar disorder, multiple prior suicide attempts, fibromyalgia, and DM2. BP and HR are currently improved. Pt has chest pain but without EKG changes or troponin change. Most likely needs to be seen  by psychiatry.     Midodrine overdose:   - BP and HR improved  - continue to monitor   - needs psych consult    Neurocardiogenic syncope:   - BP now low 91/46  - will give NS bolus  - may need to restart midodrine or consider fludrocortisone     DM:  - lantus 22 units   - SSI    Psych: MDD/anxiety, bipolar disorder, fibromyalgia   - continue buspar, valium, Cymbalta, gabapentin, remeron, topiramate     F: IV  E: daily  N: regular  PPx: lovenox  Code: FULL    Dispo: pending psych for SI     Signed: Rebeca Alert, MD, R2  05/02/2014 at: 2:47 AM     Attending Physician Note:  I have interviewed and examined the patient. I have reviewed the relevant laboratory, microbiology, and imaging studies. I have discussed the case with the medical team and ancillary staff.  I have discussed this patient with Dr. Melanee Spry, and have reviewed the findings in their note and agree with their documentation; with the following additions.    38 yo female with hx of neurocardiogenic syncope, T2DM, Bipolar d/o who presents with intentional ingestion and suicide attempt.  Took midodrine as above, intent to end her life, was feeling guilty for burdening her wife.  States falls once a week, has been hitting head, getting fitted for a helmet.  States decision was impulsive, currently denies such ideas, wishes to live and get better.  Hoping to go home soon.   Overnight noted to have bradycardia, markedly elevated BP, vitals have stabilized this AM and have remained so.  No chest pain, SOB.      Hx ow as above, exam shows obese female in NAD, speaking full sentences, appropriate, no focal weakness, no clonus, lungs clear, RRR.      Tele personally reviewed shows sinus rate of 90's currently.  38 yo female with hx as above with intentional overdose of midodrine.    No current sequela of complications form ingestion, discussed with Tox personally, relatively short acting, no evidence of ongoing toxic effects.  Seen by Psych, recommending outpt program,  no change to medications.    Given patients report that she is no longer suicidal, plan to d/c home today.      Discussed with RN, APP mary Prinzing, Tox service, Psych consult    To home today    Discharge summary personally reviewed and edited by me prior to discharge.    Tanda Rockers, MD  Hospitalist

## 2014-05-02 NOTE — Progress Notes (Signed)
Admission Date: 05/01/2014    Based on the clinical information below this patient has the diagnosis of morbid obesity} and it is present on admission.      Height/Weight  Height: 172.7 cm (5' 7.99")  Weight  Min: 134.265 kg (296 lb)   Min taken time: 05/01/14 2041  Max: 134.265 kg (296 lb)   Max taken time: 05/01/14 2041  BMI (Calculated)  Min: 45.1   Min taken time: 05/01/14 2041  Max: 45.1   Max taken time: 05/01/14 2041

## 2014-05-02 NOTE — ED Notes (Signed)
Pt requesting her prn Lorrin Mais that she takes at home, covering provider paged regarding pt request, pt in NAD, resting in bed, Will continue to monitor and treat per MD orders.

## 2014-05-02 NOTE — Progress Notes (Addendum)
[facility] discharge liaison -Probation officer cancelled patient's appt she had for today with [facility] "medication clinic" and this was re-scheduled with [name] for 05/09/14  Patient already has [facility] "group" appointment 12/30  Writer made patient PCP appt for tomorrow 12/29 at 2pm in case patient is discharged today   [phone]  Add-per provider Coral Spikes  -patient is a discharge today but will be going to [facility] program starting tomorrow 12/29-writer cancelled PCP appt for tomorrow and patient will make appt with PCP after partial hospital program completed   [facility]  Patient is very pleasant lady.

## 2014-05-02 NOTE — ED Provider Progress Notes (Signed)
ED Provider Progress Note    Discussed pt w/ Dr Payton Doughty (toxicology). Recommendation is tx with valium and prazosin. Pt with worsening bradycardia to 30s. Transferred to the [facility]. Given prazosin and valium. MICU consulted due to bradycardia.        Michele Mcalpine, MD, 05/02/2014, 12:01 AM
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
## 2014-05-02 NOTE — ED Notes (Signed)
Report received . Patient assessed / offers complaint of " mild headache"

## 2014-05-02 NOTE — Consults (Addendum)
Medical Toxicology Consult Note  Requesting Provider: Dr. Rosana Hoes  Request consult for Midodrine Overdose     HPI: Kaylee Harris is a 38 y.o. female with past medical history of recurrent syncope who presented to the ED with intentional ingestion of Midodrine (approximately 40x 5mg  tablets at 6:30pm 12/28). Patient has a previous history of suicide attempt, all by ingestion. She reports feeling a burden to her partner and feeling overwhelmed. At the time of presentation, the patient had a headache, facial numbness and transient chest pain. Consult was called last evening. We recommended 5mg  PO prazosin and 10mg  of IV valium. Patient did well overnight, no additional events. This evening, the patient denies any complaints and feels ready to go home.     Attending involved from ED with phone consultation.  Notes reviewed.  Patient had BP into 190's SBP which improved with benzodiazepines and prazosin.  No co ingestants.  CP also improved (very brief, self-limited as BP dropped apparently).  Patient appreciative.      Past Medical History:  Past Medical History   Diagnosis Date    Diabetes mellitus      Previously on SU and metformin, now diet controlled    Asthma     Allergy history unknown     GERD (gastroesophageal reflux disease)     Dysfunctional uterine bleeding     Diverticulitis 09/2010    Sebaceous cyst of breast      right axilla    Anginal pain     Hyperlipidemia     Varicella     Complication of anesthesia     Arthritis         Past Surgical History:  Past Surgical History   Procedure Laterality Date    Dilation and curettage of uterus  2005, 2007     x2 for menorraghia    Tonsillectomy      Cardiac catheterization  09/2011     negative    Loop recorder  Feb 03 2014    Arthroscopic shoulder surgery Right 2010     Related to lifting    Left colectomy  01/03/14     Dr Tresa Res      Prior to Admission Medications:  No prescriptions prior to admission      Active Hospital Medications:  Scheduled  Meds:   busPIRone  15 mg Oral BID    DULoxetine  60 mg Oral Daily    fluticasone  1 spray Nasal Daily    gabapentin  800 mg Oral TID    insulin glargine  22 Units Subcutaneous Nightly    mirtazapine  7.5 mg Oral Nightly    omeprazole  20 mg Oral QAM    ranolazine  500 mg Oral BID    topiramate  50 mg Oral BID    enoxaparin  40 mg Subcutaneous BID    lurasidone  60 mg Oral Daily     Continuous Infusions:   PRN Meds:.cyclobenzaprine, diazepam, acetaminophen    Allergies:  Allergies   Allergen Reactions    Morphine Anaphylaxis    Trazodone Anaphylaxis       Family History:  Family History   Problem Relation Age of Onset    Hypertension Father     Diabetes Father     Kidney disease Father     Elevated lipids Father     Heart attack Father 88    Hypertension Mother     Elevated lipids Mother     Heart attack Mother 35  Diabetes Mother     Eczema Mother     Psoriasis Mother     Hypertension Brother     Heart disease Brother 86     prinzmetal's angina    Heart disease Sister      currently having work up    [place] Sister     Hypertension Sister     Breast cancer Maternal Grandmother     Stroke          Social History:  History     Social History    Marital Status: Married     Spouse Name: N/A     Number of Children: N/A    Years of Education: N/A     Social History Main Topics    Smoking status: Former Smoker -- 0.50 packs/day for 3 years     Types: Cigarettes    Smokeless tobacco: Never Used    Alcohol Use: Yes      Comment: <1 weekly    Drug Use: No    Sexual Activity:     Partners: Female     Other Topics Concern    None     Social History Narrative    Married to [place], recently relocated back to [place] from [place] due to family stressors. 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        Review of Systems:  A complete Review of Systems was obtained by me, pertinent positives and negatives are included above and below, otherwise the  rest of Review of Systems is unremarkable or negative.    Physical Exam:  Temp:  [35.7 C (96.3 F)-36.7 C (98.1 F)] 36.7 C (98.1 F)  Heart Rate:  [36-97] 97  Resp:  [9-19] 12  BP: (89-191)/(50-95) 133/64 mmHg  General: NAD, alert   HEENT: Normocephalic, atraumatic  Neck: supple, trachea midline  Heme: no epistaxis or gross bleeding  Cardiovascular: no tachycardia, regular rhythm  Pulmonary: no tachypnea, cough, or stridor  Extremities: no anasarca  Neuro: No tremor, rigidity, or clonus  Musculoskeletal: no obvious deformity, joint swelling or redness  Skin: warm, dry, no diaphoresis, rash, or flushing    Lab Results:    Recent Labs  Lab 05/02/14  0831 05/01/14  2222   WBC 12.7* 18.7*   HEMOGLOBIN 10.7* 12.4   HEMATOCRIT 33* 38   PLATELETS 319 488*      Recent Labs  Lab 05/02/14  0831   SODIUM 140   POTASSIUM 4.0   CHLORIDE 107   CO2 22   ANION GAP 11       No components found with this basename: BUN, CREATININE, GLOM, GLUCOSE, CALCIUM, PHOS  No results for input(s): INR, PTT in the last 168 hours.    No components found with this basename: PT    Recent Labs  Lab 05/02/14  0831   AST 12   ALT 19       No components found with this basename: PROT, ALBU, [place] Levo     Recent Labs  Lab 05/01/14  2222   ETHANOL <10   ACETAMINOPHEN <2   SALICYLATE <1.6*       Recent Labs  Lab 05/02/14  0141   CK 59     Assessment/Plan: Kaylee Harris is a 38 y.o. female who presented with midodrine overdose. She was bradycardic, hypertensive and symptomatic last night prior to administration of prazosin and valium. Overnight the patient did well. Multiple previous attempts at self  harm.     Recommendations:   - Midodrine cleared from system, no additional toxic effects should remain    Call [phone] for questions regarding Toxicology.    Author: Nigel Bridgeman, MD  as of: 05/02/2014 at: 5:02 PM     Medical Toxicology Faculty Note Dr. Payton Doughty attending  Patient seen and assessed and discussed with [facility]  resident and I have reviewed the above note and agree with the history, exam, assessment and plan.  All data and labs reviewed and I have independently evaluated the patient.      Patient with alpha-agonist OD with hypertensive crisis treated with alpha-antagonist and benzodiazepines.  Now at baseline.  Appreciative.  Her BP improved from 190's to 140's to normal.  HR was bradycardic then improved with symptoms improving and BP drop.      F/u psychiatry.  Several initial phone calls as she was in ED from attending (myself).    Call if ?s [phone].     Seen on 05/02/2014 at 11:00 x 95 min

## 2014-05-02 NOTE — Consults (Addendum)
MICU Consult:    Prior 1    Consult Requested By: Mckinley Jewel, MD  Reason for Consult/Admission: Midodrine overdose, monitoring    History of Present Illness:   Kaylee Harris is a 38 y.o. female with PMH significant for neurocardiogenic syncope, MDD/anxiety, bipolar disorder, multiple prior suicide attempts, fibromyalgia, DM2, asthma who presents after taking #40 5 mg tablets of midodrine in suicide attempt at 6:30pm. She reports that she has frequent (~weekly) syncope episodes and that these have been both a burden to her and her wife and family. She is "tired of being a burden" and wanted to end her life because of this. She normally takes midodrine because of a history of "low blood pressure."     She presented to the ED with HTN (191/94) and bradycardia (30s-50s). Toxicology was consulted who suggestion Valium and prazosin to counteract the alpha-1 agonist activities of midodrine. MICU was consulted for need for closer monitoring given her hemodynamic instability.     Review of Systems:   12-point review of systems competed and is negative except those items mentioned in the HPI    Past Medical History:     Past Medical History   Diagnosis Date    Diabetes mellitus      Previously on SU and metformin, now diet controlled    Asthma     Allergy history unknown     GERD (gastroesophageal reflux disease)     Dysfunctional uterine bleeding     Diverticulitis 09/2010    Sebaceous cyst of breast      right axilla    Anginal pain     Hyperlipidemia     Varicella     Complication of anesthesia     Arthritis         Past Surgical History:     Past Surgical History   Procedure Laterality Date    Dilation and curettage of uterus  2005, 2007     x2 for menorraghia    Tonsillectomy      Cardiac catheterization  09/2011     negative    Loop recorder  Feb 03 2014    Arthroscopic shoulder surgery Right 2010     Related to lifting    Left colectomy  01/03/14     Dr Tresa Res        Social History:     History    Substance Use Topics    Smoking status: Former Smoker -- 0.50 packs/day for 3 years     Types: Cigarettes    Smokeless tobacco: Never Used    Alcohol Use: Yes      Comment: <1 weekly       Family History:     Family History   Problem Relation Age of Onset    Hypertension Father     Diabetes Father     Kidney disease Father     Elevated lipids Father     Heart attack Father 38    Hypertension Mother     Elevated lipids Mother     Heart attack Mother 65    Diabetes Mother     Eczema Mother     Psoriasis Mother     Hypertension Brother     Heart disease Brother 2     prinzmetal's angina    Heart disease Sister      currently having work up    [place] Sister     Hypertension Sister     Breast cancer Maternal Grandmother  Stroke          Medications:     Prior to Admission medications    Medication Sig Start Date End Date Taking? Authorizing Provider   insulin glargine (LANTUS SOLOSTAR) 100 UNIT/ML injection pen Inject 22 Units into the skin nightly 04/12/14   [name], MD   mirtazapine (REMERON) 7.5 MG tablet Take 1 tablet (7.5 mg total) by mouth nightly 03/29/14   Corigliano, Suann Larry, NP   zolpidem (AMBIEN CR) 6.25 MG CR tablet Take 1 tablet (6.25 mg total) by mouth nightly as needed for Sleep   Max daily dose: 6.25 mg Swallow whole. Do not crush, break, or chew. 03/29/14   Corigliano, Suann Larry, NP   VENTOLIN HFA 108 (90 BASE) MCG/ACT inhaler Inhale 1-2 puffs into the lungs every 4-6 hours as needed for Wheezing 03/24/14   Jennelle Human, MD   gabapentin (NEURONTIN) 800 MG tablet TAKE 1 TABLET (800 MG TOTAL) BY MOUTH 3 TIMES DAILY 03/21/14   [name], MD   diazepam (VALIUM) 5 MG tablet TAKE 1 TABLET (5 MG TOTAL) BY MOUTH 2 TIMES DAILY AS NEEDED FOR ANXIETY MAX DAILY DOSE: 10 MG 03/14/14   [name], MD   lidocaine (LIDODERM) 5 % patch  01/25/14   [provider]   lurasidone (LATUDA) 60 MG tablet Take 1 tablet (60 mg total) by mouth daily 03/02/14   Corigliano, Suann Larry,  NP   naproxen sodium (ANAPROX) 550 MG tablet TAKE 1 TABLET (550 MG TOTAL) BY MOUTH 2 TIMES DAILY (WITH MEALS) 02/21/14   [name], MD   busPIRone (BUSPAR) 15 MG tablet Take 1 tablet (15 mg total) by mouth 2 times daily 02/02/14   Corigliano, [name] D, NP   omeprazole (PRILOSEC) 20 MG capsule TAKE 1 CAPSULE (20 MG TOTAL) BY MOUTH DAILY (BEFORE BREAKFAST) 02/02/14   [name], MD   nystatin (MYCOSTATIN) cream Apply topically 2 times daily 01/27/14   [name], MD   insulin pen needle (NOVOFINE) 30G X 8 MM Use 1 times a day as instructed. 01/21/14   [name], MD   Melatonin 5 MG CAPS Take 10 mg by mouth nightly 01/18/14   Corigliano, Suann Larry, NP   Alcohol Swabs (ALCOHOL WIPES) PADS Use BID for BG check 01/18/14   [name], MD   SUMAtriptan (IMITREX) 50 MG tablet Take 1 tablet (50 mg total) by mouth as needed for Migraine   Take at onset of headache. May repeat once in 2 hours. 12/14/13   Lance Bosch, NP   fluticasone Asencion Islam) 50 MCG/ACT nasal spray 1 spray by Nasal route daily 12/13/13   [name], MD   cyclobenzaprine (FLEXERIL) 5 MG tablet TAKE 1 TABLET (5 MG TOTAL) BY MOUTH 3 TIMES DAILY AS NEEDED FOR MUSCLE SPASMS 12/06/13   Lance Bosch, NP   oxyCODONE-acetaminophen (PERCOCET) 5-325 MG per tablet Take 1 tablet by mouth every 6 hours as needed for Pain   Max daily dose: 4 tablets 11/26/13   [name], MD   midodrine (PROAMATINE) 5 MG tablet Take 5 mg by mouth 2 times daily    Trilby Leaver   DULoxetine (CYMBALTA) 60 MG capsule Take 1 capsule (60 mg total) by mouth daily 10/04/13   Corigliano, Suann Larry, NP   dicyclomine (BENTYL) 20 MG tablet  08/04/13   [provider]   topiramate (TOPAMAX) 50 MG tablet Take 1 tablet (50 mg total) by mouth 2 times daily 08/04/13   Smithlightfoot, Jana Half, NP  lancets Brand Free Style Lite; Use 2 times per day as directed for blood glucose testing. 08/04/13   [name], MD   FREESTYLE LITE test strip Use BID as directed for 250.02 08/04/13   [name], MD   famciclovir [facility]) 500 MG tablet Take 1 tablet (500 mg total) by mouth 3 times daily as needed 06/22/13   [name], MD   blood glucose monitor system Brand: cheapest brand available per her insurance.  Use as directed. 04/26/13   [name], MD   atorvastatin (LIPITOR) 40 MG tablet Take 40 mg by mouth daily (with dinner)    [provider]   ranolazine (RANEXA) 500 MG 12 hr tablet Take 500 mg by mouth 2 times daily   Swallow whole. Do not crush, break, or chew.    [provider]   Non-System Medication The above patient is followed in our clinic and cannot resume work permanently. 02/15/13   Lance Bosch, NP   ondansetron (ZOFRAN) 8 MG tablet Take 1 tablet (8 mg total) by mouth 3 times daily as needed for Nausea 01/18/13   Smithlightfoot, Jana Half, NP       Allergies:     Allergies as of 05/01/2014 - Up to Date 05/01/2014   Allergen Reaction Noted    Morphine Anaphylaxis 01/08/2013    Trazodone Anaphylaxis 01/08/2013        Physical Exam:   No intake or output data in the 24 hours ending 05/02/14 0102  Current vitals: Blood pressure 130/72, pulse 79, temperature 36.2 C (97.2 F), resp. rate 12, height 1.727 m (5' 7.99"), weight 134.265 kg (296 lb), SpO2 96 %.  BP: (130-191)/(72-94)   Temp:  [36.2 C (97.2 F)]   Temp src:  [-]   Heart Rate:  [36-79]   Resp:  [12-15]   SpO2:  [96 %-98 %]   Height:  [172.7 cm (5' 7.99")]   Weight:  [134.265 kg (296 lb)]   Vent settings:      General: Alert/interactive. NAD.  HEENT: Normocephalic, atraumatic. PERRL. MMM, anicteric, oropharynx benign.  Neck: No LAD. Unable to assess JVD.   Cardiac: RRR, normal S1/S2, no M/R/G.  Pulm: Protecting airway. Normal WOB, CTA-B. No crackles, wheezes, or rhonchi.   Abd: +BS, soft, non-tender, obese  Ext: WWP, 2+ peripheral pulses, no edema  Neuro: AOx3, speech/language WNL, moving all extremities, strength/sensation grossly intact. Coordination/gait not formally tested.    Lab, EKG, and Imaging  Results:     Complete blood count (CBC) Basic metabolic panel (BMP)   Recent Labs      05/01/14   2222   WBC  18.7*   HEMOGLOBIN  12.4   HEMATOCRIT  38   PLATELETS  488*      No results for input(s): NA, K, CL, CO2, UN, CREAT, EGFRR, GLU, CA, ALB, ALB1, PO4 in the last 168 hours.     Coagulation panel Cardiac Enzymes   No results for input(s): INR, PTT in the last 72 hours.    No components found with this basename: PT No results for input(s): CKTS, TROPU, TROP, MCKMB, CKMB in the last 168 hours.    No components found with this basename: RICKMBS     Liver Function Tests (LFTs)    No results for input(s): ALK, TB, HTBIL, DB, ALB, ALB1, ALT, AST, FLTP, TP in the last 168 hours.      Radiology Impressions (last 3 days):    No results found.  EKG:    Sinus bradycardia without ST changes    Micro:   None     Imaging:    No results found.    Assessment / Recommendations  Case of HTN and bradycardia 2/2 midodrine overdose in a 38 y.o. femalewith neurocardiogenic syncope, MDD/anxiety, bipolar disorder, multiple prior suicide attempts, fibromyalgia, DM2, asthma. Although I agree that patient initially presented with significant hemodynamic instability, she currently has HR 60s, BP 98/50, and denies lightheadedness. The duration of midodrine is on the measure of hours (2-3 per Lexicomp), so she is likely through the worst of her overdose, now >6 hours post ingestion. At this time she is stable for the floor, which has been discussed with toxicology and they are in agreement. My recommendations are as follows:    - Would trial 1L LR bolus now to see if her BP improves (she does dip down into the low 90's for SBP)  - Repeat EKG now given chest pain  - Check cardiac enzymes given chest pain and trend if elevated  - Will need psychiatry in the AM    Please do not hesitate to re-consult MICU if she deteriorates    Audelia Hives, MD on 05/02/2014 at 1:02 AM  Internal Medicine, PGY-2

## 2014-05-02 NOTE — ED Notes (Signed)
Pt transferred to [facility]. Pt states that she overdosed on meds today to harm self. PT states that has been having "dizzy spells" that are causing falls. Feels that she is at a loss and does not know what else to do. PT bradycardic to the 30s with mild dizziness. Stable blood pressure. Spouse and mother at bedside. Pads placed on pt

## 2014-05-03 ENCOUNTER — Ambulatory Visit: Payer: Self-pay

## 2014-05-03 ENCOUNTER — Encounter: Payer: Self-pay | Admitting: Psychiatry

## 2014-05-03 ENCOUNTER — Encounter: Payer: Self-pay | Admitting: Primary Care

## 2014-05-03 ENCOUNTER — Ambulatory Visit
Admit: 2014-05-03 | Discharge: 2014-05-05 | Disposition: A | Discharge status: Home / Self Care | Payer: Self-pay | Source: Ambulatory Visit | Attending: Psychiatry | Admitting: Psychiatry

## 2014-05-03 ENCOUNTER — Ambulatory Visit: Payer: Self-pay | Admitting: Family Medicine

## 2014-05-03 DIAGNOSIS — Z639 Problem related to primary support group, unspecified: Secondary | ICD-10-CM

## 2014-05-03 DIAGNOSIS — F332 Major depressive disorder, recurrent severe without psychotic features: Secondary | ICD-10-CM

## 2014-05-03 DIAGNOSIS — Z789 Other specified health status: Secondary | ICD-10-CM

## 2014-05-03 DIAGNOSIS — F419 Anxiety disorder, unspecified: Secondary | ICD-10-CM

## 2014-05-03 DIAGNOSIS — F603 Borderline personality disorder: Secondary | ICD-10-CM

## 2014-05-03 NOTE — Progress Notes (Signed)
Clinical Management Note     Writer received a call from Dr. Kerin Ransom, [facility]'s cardiologist.  He did not know about the patient's overdose so writer gave him that information and the plan for patient to attend PHP. He states they have not been able to find any physiologic reason for the patient's episodes of dizziness.  She does not have vaso-vagal syncope as on the testing there is no change in her heart rate when the episodes occur. His conclusion is that the episodes are psychogenic and he will be tapering Kaylee Harris off all of her cardiac medications.  Writer thanked him for the information and discussed with him that this information would change the focus of her psychiatric treatment.
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
## 2014-05-04 ENCOUNTER — Ambulatory Visit: Payer: Self-pay | Admitting: Clinical

## 2014-05-04 ENCOUNTER — Other Ambulatory Visit: Payer: Self-pay | Admitting: Primary Care

## 2014-05-04 ENCOUNTER — Ambulatory Visit: Payer: Self-pay

## 2014-05-04 VITALS — BP 137/79 | HR 97 | Temp 97.5°F | Resp 14 | Ht 68.0 in | Wt 295.0 lb

## 2014-05-04 DIAGNOSIS — F4310 Post-traumatic stress disorder, unspecified: Secondary | ICD-10-CM

## 2014-05-04 DIAGNOSIS — Z789 Other specified health status: Secondary | ICD-10-CM

## 2014-05-04 DIAGNOSIS — Z639 Problem related to primary support group, unspecified: Secondary | ICD-10-CM

## 2014-05-04 DIAGNOSIS — F332 Major depressive disorder, recurrent severe without psychotic features: Secondary | ICD-10-CM

## 2014-05-04 DIAGNOSIS — F603 Borderline personality disorder: Secondary | ICD-10-CM

## 2014-05-04 DIAGNOSIS — F419 Anxiety disorder, unspecified: Secondary | ICD-10-CM
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
## 2014-05-04 NOTE — BH Treatment Plan (Signed)
[facility]  Interdisciplinary Treatment Plan      R PSY TP DATE FROM 05/04/2014   FROM 05/04/2014     R PSY TP DATE TO 05/04/2014   TO 05/18/2014     Diagnostic Impression    ICD-10-CM ICD-9-CM    1. 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 Anxiety disorder, unspecified anxiety disorder type F41.9 300.00    3. Borderline personality disorder F60.3 301.83    4. Known medical problems Z78.9 V49.89    5. Relationship problems F93.8 313.3      Reviewed PHP Program Safety Information sheet/verified patient has a copy: yes  Strengths: See intake assessment    Problems/Goals/Objectives section  PROBLEM #1: Depression with Anxiety    1.  INDIVIDUALIZED MEASURABLE MANIFESTATIONS:  Kaylee Kaylee Harris reported her depression as 6 out of 10 with 10 equal to most severe symptoms for 4 weeks prior to admission.  Kaylee Kaylee Harris attempted suicide by overdose on 05/01/2014.  Kaylee Kaylee Harris reported her anxiety as 10 out of 10 with 10 equal to most severe symptoms for 4 weeks prior to admission.    2.  GOAL:    To stabilize symptoms of depression and anxiety  To decrease suicidal ideation and improve mood.    3.  OBJECTIVES:  Kaylee Kaylee Harris will report her depression as 4 or less out of 10 with 10 equal to most severe symptoms for 3 out of 5 consecutive days.  Kaylee Kaylee Harris will report a decrease in the intensity/frequency of suicidal thoughts and be free of suicidal thoughts for 3 out of 5 consecutive days.   Kaylee Kaylee Harris will not attempt suicide while in [facility] and will engage in safety planning.   Kaylee Kaylee Harris will report her anxiety as 8 or less out of 10 with 10 equal to most severe symptoms for 3 out of  5 consecutive days.               4.  CRITERIA FOR DISCHARGE:    Decrease depression and anxiety as evidenced by patient report.  Decreased suicidal ideation and improved functioning.              5.  ANTICIPATED DATE OF ACHIEVEMENT:  R PSY TP DATE TO  05/04/2014   TO 05/18/2014          Treatment Modalities  Primary therapist will provide supportive psychotherapy 1 time per week or more as needed.    Initial psychopharmacology assessment by psychiatrist or psychiatric nurse practitioner, and follow-up by medication assessment team for prescribing and monitoring psychiatric medications.    GENERAL group leaders will provide 5 hours of group therapy 5 times per week.    Nurse Clinic will provide a Kaylee Harris screen to ensure no concurrent medical condition is contributing to the clinical picture within Kaylee Kaylee Harris's first week in the PHP.  Should a medical condition be found or suspected, Nurse will refer Kaylee Kaylee Harris to the primary care doctor.     Social Worker will assist with and provide referral/linkage to aftercare providers when none are already established and/or assistance with psychosocial needs.    At least 1 contact by treatment team with Kaylee Kaylee Harris during Kaylee Kaylee Harris's PHP admission if Kaylee Harris is willing. Family/friend Kaylee Harris: Kaylee Kaylee Harris [facility] is willing for team to contact).    Kaylee Kaylee Harris and her family will be offered opportunity for psychoeducation in weekly multi-family/friends group.    Treatment team will contact Kaylee Kaylee Harris's family/friend Kaylee Harris Kaylee Kaylee Harris  Kaylee Kaylee Harris regarding patient safety plan to include addressing access to lethal means (securing medications/firearms) as follows: Patients wife secures medications and dispenses as necessary.      Discharge Plan After Kaylee Kaylee Harris PSY F/U TYPE 05/04/2014   APPT TYPE Therapist     R PSY F/U NAME 05/04/2014   APPT NAME Kaylee Kaylee Harris, LMSW   Comments -     ____________________________________________________________________  R PSY F/U TYPE 05/04/2014   APPT TYPE Psychiatrist      Date Entered 05/04/2014   APPT NAME [name], NP     Living Situation: Kaylee Kaylee Harris lives (with) spouse  Financial Kaylee Harris: On disability    [employer] operational officer Participating in Psychologist, counselling of Treatment Plan: Kaylee Kaylee Harris, LMSW,  Kaylee [facility], LMSW, Kaylee [place], LCSW-R, Kaylee [facility], LCSW-R, Kaylee Kaylee Harris, MSW, [place], ACBSW, [place], RNC, Mertie Moores, NP, Verdie Drown, NP, Gemma Payor, LMSW, [employer], LCSW-R, Arville Lime, MHC, Kandis Ban, LMSW, Donny Pique, MD, [name]    Signature: _________________________________________________    Date: __________________________ Time: ___________________________

## 2014-05-04 NOTE — Progress Notes (Signed)
[facility]  Nursing Health Assessment and Brief Medical Screen     05/04/2014    Patient Name:  Kaylee Harris  Patient Date of Birth:  10/06/1975  Patient Medical Record Number:  [id number]    Health Screen:  Date of Last Physical Exam  Date of Last Physical Exam:  (had a physical within past yr, has follow up , early January)   Name of Primary Care Provider:  [name], MD        Medical History:  Past Medical History   Diagnosis Date    Diabetes mellitus      Previously on SU and metformin, now diet controlled    Asthma     Allergy history unknown     GERD (gastroesophageal reflux disease)     Dysfunctional uterine bleeding     Diverticulitis 09/2010    Sebaceous cyst of breast      right axilla    Anginal pain     Hyperlipidemia     Varicella     Complication of anesthesia     Arthritis     Abscess of abdominal wall 01/18/2014     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 Lower abdomen with cellulitis changes, wound probed and purulent material expressed.  Had PICC line for "multiple infiltrations" of what? D/c-ed home on 10d of Augmentin 9/11.      Depression     Anxiety     Neuromuscular disorder      Obstetrical/Gynecological Screen:                       Surgical History:  Past Surgical History   Procedure Laterality Date    Dilation and curettage of uterus  2005, 2007     x2 for menorraghia    Tonsillectomy      Cardiac catheterization  09/2011     negative    Loop recorder  Feb 03 2014    Arthroscopic shoulder surgery Right 2010     Related to lifting    Left colectomy  01/03/14     Dr Tresa Res    Small intestine surgery       Medications:  Medications/Vitamins/Supplements         Last Dose Start Date End Date Provider     Alcohol Swabs (ALCOHOL WIPES) PADS   01/18/14  --  [name], MD     Use BID for BG check     DULoxetine (CYMBALTA) 60 MG capsule Taking  10/04/13  --  Garry Heater, NP     Take 1 capsule  (60 mg total) by mouth daily     FREESTYLE LITE test strip Taking  08/04/13  --  [name], MD     Use BID as directed for 250.02     Melatonin 5 MG CAPS Taking  01/18/14  --  Garry Heater, NP     Take 10 mg by mouth nightly     Non-System Medication   02/15/13  --  Lance Bosch, NP     The above patient is followed in our clinic and cannot resume work permanently.     SUMAtriptan (IMITREX) 50 MG tablet Taking  12/14/13  --  Lance Bosch, NP     Take 1 tablet (50 mg total) by mouth as needed for Migraine   Take at onset of headache. May repeat once in 2 hours.  VENTOLIN HFA 108 (90 BASE) MCG/ACT inhaler Taking  03/24/14  --  Jennelle Human, MD     Inhale 1-2 puffs into the lungs every 4-6 hours as needed for Wheezing     blood glucose monitor system Taking  04/26/13  --  [name], MD     Brand: cheapest brand available per her insurance.  Use as directed.     busPIRone (BUSPAR) 15 MG tablet Taking  02/02/14  --  Corigliano, Suann Larry, NP     Take 1 tablet (15 mg total) by mouth 2 times daily     cyclobenzaprine (FLEXERIL) 5 MG tablet Taking  12/06/13  --  Lance Bosch, NP     TAKE 1 TABLET (5 MG TOTAL) BY MOUTH 3 TIMES DAILY AS NEEDED FOR MUSCLE SPASMS     diazepam (VALIUM) 5 MG tablet Taking  03/14/14  --  [name], MD     TAKE 1 TABLET (5 MG TOTAL) BY MOUTH 2 TIMES DAILY AS NEEDED FOR ANXIETY MAX DAILY DOSE: 10 MG     dicyclomine (BENTYL) 20 MG tablet Taking  08/04/13  --  [provider]          famciclovir (FAMVIR) 500 MG tablet Taking  06/22/13  --  [name], MD     Take 1 tablet (500 mg total) by mouth 3 times daily as needed     fluticasone (FLONASE) 50 MCG/ACT nasal spray Taking  12/13/13  --  [name], MD     1 spray by Nasal route daily     gabapentin (NEURONTIN) 800 MG tablet Taking  03/21/14  --  [name], MD     TAKE 1 TABLET (800 MG TOTAL) BY MOUTH 3 TIMES DAILY     Patient taking differently:  Take 1 tablet (800 mg) twice DAILY  BID)      insulin glargine (LANTUS SOLOSTAR) 100 UNIT/ML injection pen Taking  04/12/14  --  [name], MD     Inject 22 Units into the skin nightly     insulin pen needle (NOVOFINE) 30G X 8 MM Taking  01/21/14  --  [name], MD     Use 1 times a day as instructed.     lancets Taking  08/04/13  --  [name], MD     Brand Free Style Lite; Use 2 times per day as directed for blood glucose testing.     lurasidone (LATUDA) 60 MG tablet Taking  03/02/14  --  Corigliano, Suann Larry, NP     Take 1 tablet (60 mg total) by mouth daily     midodrine (PROAMATINE) 5 MG tablet Taking  05/02/14  06/01/14  Prinzing, Fowler, PA     Take 1 tablet (5 mg total) by mouth 3 times daily     mirtazapine (REMERON) 7.5 MG tablet Taking  03/29/14  --  Corigliano, Suann Larry, NP     Take 1 tablet (7.5 mg total) by mouth nightly     naproxen sodium (ANAPROX) 550 MG tablet Taking  02/21/14  --  [name], MD     TAKE 1 TABLET (550 MG TOTAL) BY MOUTH 2 TIMES DAILY (WITH MEALS)     nystatin (MYCOSTATIN) cream Not Taking  01/27/14  --  [name], MD     Apply topically 2 times daily     omeprazole (PRILOSEC) 20 MG capsule Taking  02/02/14  --  [name], MD     TAKE 1 CAPSULE (20  MG TOTAL) BY MOUTH DAILY (BEFORE BREAKFAST)     ondansetron (ZOFRAN) 8 MG tablet Taking  01/18/13  --  Smithlightfoot, Jana Half, NP     Take 1 tablet (8 mg total) by mouth 3 times daily as needed for Nausea     ranolazine (RANEXA) 500 MG 12 hr tablet Taking  --  --  [provider]     Take 500 mg by mouth 2 times daily   Swallow whole. Do not crush, break, or chew.     topiramate (TOPAMAX) 50 MG tablet Taking  08/04/13  --  Smithlightfoot, Jana Half, NP     Take 1 tablet (50 mg total) by mouth 2 times daily     zolpidem (AMBIEN CR) 6.25 MG CR tablet Taking  03/29/14  --  Corigliano, Suann Larry, NP     Take 1 tablet (6.25 mg total) by mouth nightly as needed for Sleep   Max daily dose: 6.25 mg Swallow whole. Do not crush, break, or chew.         Allergies:  Allergy History as of 05/04/14     TRAZODONE       Noted Status Severity Type Reaction    01/08/13 1545 Morris, Arrie Aran 01/08/13 Active High Allergy Anaphylaxis          MORPHINE       Noted Status Severity Type Reaction    01/08/13 1545 Morris, Arrie Aran 01/08/13 Active High Allergy Anaphylaxis              Nutrition/Hydration Screening:  Weight loss or gain of 10 pounds or more in the past 3 mo.?: Yes - Patient reports weight GAIN of 10 Lbs. or more in the past three months  Does the patient report a recent change in appetite?:  (reports binge eating, non adherence to diabetic protocol)          Tobacco Use Screen:  History   Smoking status    Former Smoker -- 0.50 packs/day for 3 years    Types: Cigarettes   Smokeless tobacco    Never Used     Pain Assessment:  Do you have any ongoing pain problems?: Yes  Where is your pain located?: Generalized (hips, legs, lower back)  Where is the pain oriented?: Lower  How often do you experience the pain?: Continuous  What does the pain feel like?: Jabbing  Pain Scale (0-10): 3  Pain is adequately controlled?: Yes    Fall Risk Screen:  Have you fallen in the last year?: Yes (reports "blacking out" neurocardiac syncopy, with angina)  Do you feel you are at risk of falling?: Yes (uses a walker for ambulation, stabilization and support)    Follow-Up:   Patient uses tobacco/nicotine and was given smoking cessation counseling/resources: N/A   Patient has not had a physical within the last year and was instructed to call PCP to schedule an appointment: Yes, has an appt. In early January 2016   Patient has a current issue with pain requiring an appointment with a medical provider, and pt was provided with education and was instructed to call PCP to schedule an appointment: Yes   Patient has a current concern with eating/weight change requiring an appointment with a medical provider, and pt was provided with education and was instructed to call PCP to schedule an  appointment:    Patient has a current physical issue requiring an appointment with a medical provider, and pt was provided with education and was instructed to call PCP to  schedule an appointment: Yes, sees a neurologist regularly, next follow up in March 2016   Patient is currently pregnant and not getting prenatal care and was instructed to call OBGYN to schedule an appointment: N/A   Patient has barriers to accessing medical care which were addressed as follows: N/A   Patient has a knowledge deficit about medical problems/treatment needs which writer addressed as follows: Yes, need for effective management of diabetes, struggling with acceptance of adaptive equipment ( walker, shower chair), effective coping strategies

## 2014-05-05 ENCOUNTER — Ambulatory Visit: Payer: Self-pay

## 2014-05-05 MED ORDER — LURASIDONE HCL 40 MG PO TABS *I*
40.0000 mg | ORAL_TABLET | Freq: Every day | ORAL | Status: DC
Start: 2014-05-05 — End: 2014-05-11

## 2014-05-05 MED ORDER — DIAZEPAM 5 MG PO TABS *I*
5.0000 mg | ORAL_TABLET | Freq: Every evening | ORAL | Status: DC | PRN
Start: 2014-05-05 — End: 2014-05-17

## 2014-05-05 MED ORDER — ALCOHOL WIPES PADS
MEDICATED_PAD | Status: DC
Start: 2014-05-05 — End: 2014-08-23
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
## 2014-05-05 NOTE — Progress Notes (Signed)
NP Progress Note    [facility] therapist Kaylee Harris notified writer that pt had admitted to non-compliance with her Latuda prior to her overdose because of the akathisia side effect. Writer met with pt who reports she is sick of feeling the agitation in the evening and ends up taking her bedtime medication early just because the akathisia gets so bad that she just wants to go to sleep. She reports by morning it has dissipated. She does feel the Latuda has helped her mood and feels that skipping the medication may have contributed to pt's suicide attempt.    Writer is waiting to hear back from Kaylee Silversmith, NP for further suggestions, but in the meantime pt will decrease the Latuda dose to 133m for the weekend. Writer suggested taking PRN Valium if this is not effective, as benzodiazepines can resolve akathisia. This may not be an ideal long-term plan, but in the short-term will likely be helpful. Writer provided #7 tablet supply of both Latuda 417mtablets and diazepam 33m28mablets.

## 2014-05-06 ENCOUNTER — Ambulatory Visit
Admit: 2014-05-06 | Discharge: 2014-05-18 | Disposition: A | Discharge status: Home / Self Care | Payer: Self-pay | Source: Ambulatory Visit | Attending: Psychiatry | Admitting: Psychiatry

## 2014-05-06 HISTORY — PX: APPENDECTOMY: SHX54

## 2014-05-08 ENCOUNTER — Encounter: Payer: Self-pay | Admitting: Gastroenterology

## 2014-05-08 ENCOUNTER — Telehealth: Payer: Self-pay | Admitting: Primary Care

## 2014-05-08 NOTE — Telephone Encounter (Signed)
[facility]: After Hours / On-Call Note    Date of Call: 05/08/2014  Time of Call: 4:15 PM    PCP:  [name], MD  at [facility]     Problem: Fall  Patient fell earlier today, hit head, wondering if she might have a concussion.  Reports she has a 9 out of 10 headache, but denies other symptoms such as focal numbness, weakness, difficulty swallowing or speaking.  Patient says she is nauseous though and is worried b/c her head hurts so much.  She has had prior falls with head trauma within the past few months.  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      Impression: Fall with head trauma, possible concussion    Plan:   Since patient has such a bad headahce (9 out of 10) and is feeling nauseous, and has hx of recurrent falls d/t syncope, I think she should be evaluated and I recommended she go to ED.  She was agreeable and was very concerned about her symptoms.   Pt will get a ride to ED and we will follow up with her by phone again tomorrow.       Jennelle Human, MD  [address]  [place], [place]  [id number]  [phone] phone  [phone] fax

## 2014-05-09 ENCOUNTER — Ambulatory Visit: Payer: Self-pay

## 2014-05-09 ENCOUNTER — Telehealth: Payer: Self-pay

## 2014-05-09 ENCOUNTER — Ambulatory Visit: Payer: Self-pay | Admitting: Psychiatry

## 2014-05-09 ENCOUNTER — Telehealth: Payer: Self-pay | Admitting: Psychiatry

## 2014-05-09 NOTE — Telephone Encounter (Signed)
Call to pt for [facility] ED f/u, no answer, left message to call for follow up.

## 2014-05-09 NOTE — Telephone Encounter (Signed)
Please call and see if she can come in earlier. She should be seen for further medication prescriptions.    Thanks,  Signed by Gildardo Griffes. Megan Salon, MD at 4:33 PM  [place]

## 2014-05-09 NOTE — Telephone Encounter (Signed)
See on-call telephone documentation dated 05/08/13.  Pt states she did go to ED as directed by on-call provider.  She was told she had a mild concussion and that she needed to rest, meaning not TV, cell phone, computer.  Pt states she is attending "partial" all week and is unable to do as directed.  Pt states the tylenol/ibuprofen she was advised to take is not alleviating HA.    Pt wondering if alternate medication could be written to help with HA, please advise.    Call transferred to office staff to schedule ED f/u appt
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
## 2014-05-09 NOTE — Telephone Encounter (Signed)
Patient called to cancel 05/09/14 @ 2:40 pm appointment with [name].  Patient is cancelling the appointment due to in partial.  The appointment was rescheduled for 05/23/14 at 2:40 pm.  [name] was notified of the changed appointment.

## 2014-05-10 ENCOUNTER — Ambulatory Visit: Payer: Self-pay

## 2014-05-10 LAB — EKG 12-LEAD
P: 0 degrees
QRS: 21 degrees
Rate: 52 {beats}/min
Severity: NORMAL
Severity: NORMAL
T: 35 degrees

## 2014-05-10 MED ORDER — BENZTROPINE MESYLATE 0.5 MG PO TABS *I*
0.5000 mg | ORAL_TABLET | Freq: Every evening | ORAL | Status: DC
Start: 2014-05-10 — End: 2014-05-12
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
## 2014-05-10 NOTE — Telephone Encounter (Signed)
Patient scheduled to see Lorie 05/11/14.

## 2014-05-10 NOTE — Telephone Encounter (Signed)
appt scheduled 05/11/14 with NP

## 2014-05-10 NOTE — Progress Notes (Signed)
NP Progress Note    Met with patient at her request. Patient reports that she is still experiencing akathisia with Latuda at the 40 mg dose.  She has also noticed that she has been more irritable at the lower dose.  She has had to take the Valium every night within half an hour of taking the Latuda in order to reduce the akathisia. Patient reports she fell and hit her head over the weekend, and now has a concussion.     Writer notified patient's I had spoken to her nurse practitioner [name].  Discussed that [name] had suggested addition of benztropine.  Writer provided information on potential risks and benefits of this medication, and patient gave informed consent to trial of benztropine 0.5 mg nightly.  Writer suggested taking this along with her Latuda.    Writer asked patient if her cardiologist has explained to her why he is taking her off of all of her cardiac medications.  Patient reports that he had told her that he does not believe she has any cardiac condition and that her falls may be related to her anxiety.  Patient reports she does not believe this explanation is true and was upset when he suggested it. Writer explained that this explanation for the symptoms did not imply that she was purposely falling, but rather that the falls may be a physical response to psychiatric distress. Writer explained that patient's cardiologist had also proposed this explanation to [place], and that Armed forces operational officer had also discussed that this explanation could be plausible. Patient reports the episodes do not occur when she is stressed or anxious, rather randomly.  She remains skeptical of this explanation.  Writer pointed out to patient that if this is the explanation for her falls, that continued mental health treatment and reduction of stress may help reduce them.        Plan:  Continue Latuda 40 mg nightly  Start benztropine 0.5 mg nightly  Continue Valium as needed if benztropine is ineffective  Reevaluate  tomorrow; if benztropine not effective, consider alternative mood stabilizers  If benztropine is effective, consider dose increase back to 60 mg daily

## 2014-05-11 ENCOUNTER — Encounter: Payer: Self-pay | Admitting: Gastroenterology

## 2014-05-11 ENCOUNTER — Ambulatory Visit: Payer: Self-pay | Admitting: Clinical

## 2014-05-11 ENCOUNTER — Ambulatory Visit: Payer: Self-pay | Admitting: Family Medicine

## 2014-05-11 ENCOUNTER — Ambulatory Visit
Admit: 2014-05-11 | Discharge: 2014-05-11 | Disposition: A | Discharge status: Home / Self Care | Payer: Self-pay | Source: Ambulatory Visit | Attending: Primary Care | Admitting: Primary Care

## 2014-05-11 ENCOUNTER — Ambulatory Visit: Payer: Self-pay

## 2014-05-11 ENCOUNTER — Encounter: Payer: Self-pay | Admitting: Family Medicine

## 2014-05-11 VITALS — BP 120/90 | HR 99 | Temp 97.5°F | Ht 68.0 in | Wt 300.6 lb

## 2014-05-11 DIAGNOSIS — IMO0002 Reserved for concepts with insufficient information to code with codable children: Secondary | ICD-10-CM

## 2014-05-11 DIAGNOSIS — S060XAA Concussion with loss of consciousness status unknown, initial encounter: Secondary | ICD-10-CM

## 2014-05-11 DIAGNOSIS — E1169 Type 2 diabetes mellitus with other specified complication: Secondary | ICD-10-CM

## 2014-05-11 DIAGNOSIS — S060X9A Concussion with loss of consciousness of unspecified duration, initial encounter: Secondary | ICD-10-CM

## 2014-05-11 DIAGNOSIS — E669 Obesity, unspecified: Secondary | ICD-10-CM

## 2014-05-11 LAB — LIPID PANEL
Chol/HDL Ratio: 6.7
Cholesterol: 240 mg/dL — AB
HDL: 36 mg/dL
Non HDL Cholesterol: 204 mg/dL
Triglycerides: 425 mg/dL — AB

## 2014-05-11 LAB — LDL CHOLESTEROL, DIRECT: LDL Direct: 165 mg/dL

## 2014-05-11 MED ORDER — LURASIDONE HCL 40 MG PO TABS *I*
40.0000 mg | ORAL_TABLET | Freq: Every day | ORAL | Status: DC
Start: 2014-05-11 — End: 2014-05-12

## 2014-05-11 NOTE — Progress Notes (Addendum)
MENTAL HEALTH REFERRAL FORM         Date Completed: 05-11-14         []  [facility] [x]  [facility]    INDIVIDUAL INFORMATION     Name: Kaylee Harris  DOB: Jul 02, 1975  Sex: Female  Address: [address]  [place] [address]  Home Phone: [phone] (home) [phone] (work)  Cell Phone: [phone]  Email: [email or web address]    Ethnicity: Caucasian    Primary Language: English    Marital Status: Married    Are you a English as a second language teacher? no    Highest Level of Education CompletedForensic psychologist    Religion: other Tour manager     Current Legal Status: none    Living Arrangement: spouse    Emergency Contact: Kaylee Harris   Relationship: Spouse   Phone: [phone]    Have you ever been a client of [facility] in the past? No    Do you have a family member or significant other receiving mental health services at [facility]? No    INSURANCE INFORMATION     Social Security Number: [id number]    Medicaid Number : [id number]  [facility] of Responsibility: [facility] Option Plan: yes  If yes: MVP Option    Medicare Number: none  Medicare Part A: no  Medicare Part B: no  Medicare Part D Number: none  Provider: none    Other Insurance Provider: No  Other Provider Number: n/a    Are you enrolled in any Medicaid Waiver Program? No     Employment Status: not employed    Do you have a Medicaid spenddown: No    Do you participate in a Medicaid buy-in?  no     [place]      Name/Address Phone Fax   Primary Care Medical Provider Kaylee Drilling, MD  [place] [address] [phone]   Therapist [place] Kaylee Harris [phone]    Psychiatrist [name] NP [place]  [phone]   Case Manager n/a n/a n/a   Residential Contact n/a n/a n/a   Other Psychiatry Services n/a n/a n/a       PERSONAL GOAL INFORMATION     What are your life goals? Increase socialization,  increase coping skills, learn how to repair relationship with family, learn ways to reduce anxiety     What barriers to those goals are you working to overcome? Low self esteem so feels uncomfortable going places, anxiety, depression, large groups of people are difficult     What symptoms do you experience (check all that apply)?:  [x]  Dangerous to self or others  [x]  Depression/Mania  [x]  Confusion/Disorientation  []  Non-compliant with medications  [x]  History of suicide/danger to self  [x]  Anxiety/Agitation  [x]  Poor judgment/impulsivity  [x]  Problems with eating  [x]  Self harm (self-injurious behaviors  [x]  Paranoia  []  Memory loss  [x]  Problems with sleeping  []  Homicidal - danger to others  [x]  Hallucinations  [x]  Socially withdrawn  [x]  Racing thoughts  [x]  Ritualistic behavior  [x]  Severe mood swings  []  Sexual inappropriateness  []  Other:     Additional notes: none     Describe your ability to participate, interact, and accept responsibility in group situations: sits quietly and listen, when called on becomes anxious. Would like to work on feeling more comfortable in a group setting.     PSYCHIATRIC DIAGNOSIS  No diagnosis found.  Previous psychiatric hospitalizations: January 2015 after attempting to overdose   Admission/Discharge dates: January 2015 for 10 days     Previous Outpatient programs: [facility] outpatient for a year, [facility] in January 2015 and now 04-24-14 to 05-17-14.   Admission/Discharge dates: see above     Current Medications:  Current Outpatient Prescriptions   Medication Sig    benztropine (COGENTIN) 0.5 MG tablet Take 1 tablet (0.5 mg total) by mouth nightly    Alcohol Swabs (ALCOHOL WIPES) PADS Use BID for BG check    lurasidone (LATUDA) 40 MG tablet Take 1 tablet (40 mg total) by mouth daily    diazepam (VALIUM) 5 MG tablet Take 1 tablet (5 mg total) by mouth nightly as needed for Anxiety   Max daily dose: 5 mg FOR ANXIETY MAX DAILY DOSE: 10 MG     levonorgestrel (MIRENA) 20 MCG/24HR IUD 1 each by Intrauterine route once    midodrine (PROAMATINE) 5 MG tablet Take 1 tablet (5 mg total) by mouth 3 times daily    insulin glargine (LANTUS SOLOSTAR) 100 UNIT/ML injection pen Inject 22 Units into the skin nightly    mirtazapine (REMERON) 7.5 MG tablet Take 1 tablet (7.5 mg total) by mouth nightly    zolpidem (AMBIEN CR) 6.25 MG CR tablet Take 1 tablet (6.25 mg total) by mouth nightly as needed for Sleep   Max daily dose: 6.25 mg Swallow whole. Do not crush, break, or chew.    VENTOLIN HFA 108 (90 BASE) MCG/ACT inhaler Inhale 1-2 puffs into the lungs every 4-6 hours as needed for Wheezing    gabapentin (NEURONTIN) 800 MG tablet TAKE 1 TABLET (800 MG TOTAL) BY MOUTH 3 TIMES DAILY (Patient taking differently: Take 1 tablet (800 mg) twice DAILY  BID))    naproxen sodium (ANAPROX) 550 MG tablet TAKE 1 TABLET (550 MG TOTAL) BY MOUTH 2 TIMES DAILY (WITH MEALS)    busPIRone (BUSPAR) 15 MG tablet Take 1 tablet (15 mg total) by mouth 2 times daily    omeprazole (PRILOSEC) 20 MG capsule TAKE 1 CAPSULE (20 MG TOTAL) BY MOUTH DAILY (BEFORE BREAKFAST)    nystatin (MYCOSTATIN) cream Apply topically 2 times daily    insulin pen needle (NOVOFINE) 30G X 8 MM Use 1 times a day as instructed.    Melatonin 5 MG CAPS Take 10 mg by mouth nightly    SUMAtriptan (IMITREX) 50 MG tablet Take 1 tablet (50 mg total) by mouth as needed for Migraine   Take at onset of headache. May repeat once in 2 hours.    fluticasone (FLONASE) 50 MCG/ACT nasal spray 1 spray by Nasal route daily    cyclobenzaprine (FLEXERIL) 5 MG tablet TAKE 1 TABLET (5 MG TOTAL) BY MOUTH 3 TIMES DAILY AS NEEDED FOR MUSCLE SPASMS    DULoxetine (CYMBALTA) 60 MG capsule Take 1 capsule (60 mg total) by mouth daily    dicyclomine (BENTYL) 20 MG tablet     topiramate (TOPAMAX) 50 MG tablet Take 1 tablet (50 mg total) by mouth 2 times daily    lancets Brand Free Style Lite; Use 2 times per day as directed for blood  glucose testing.    FREESTYLE LITE test strip Use BID as directed for 250.02    famciclovir (FAMVIR) 500 MG tablet Take 1 tablet (500 mg total) by mouth 3 times daily as needed    blood glucose monitor system Brand: cheapest brand available per her insurance.  Use as directed.  ranolazine (RANEXA) 500 MG 12 hr tablet Take 500 mg by mouth 2 times daily   Swallow whole. Do not crush, break, or chew.    Non-System Medication The above patient is followed in our clinic and cannot resume work permanently.    ondansetron (ZOFRAN) 8 MG tablet Take 1 tablet (8 mg total) by mouth 3 times daily as needed for Nausea     No current facility-administered medications for this visit.     Chronic Medical Conditions:  Patient Active Problem List   Diagnosis Code    DM (diabetes mellitus), type 2, uncontrolled E11.65    Migraine with aura G43.109    Asthma J45.909    Hyperlipidemia E78.5    Fibromyalgia M79.7    Neurocardiogenic syncope R55    Major depression, recurrent F33.9    IUD (intrauterine device) in place Z97.5    Skin lesion of face L98.9    IBS (irritable bowel syndrome) K58.9    Anxiety F41.9    OCD (obsessive compulsive disorder) F42    Carpal tunnel syndrome G56.00    Meralgia paresthetica of left side G57.12    No diabetic retinopathy in both eyes Z03.89     Special Needs: uses a walker, takes insulin at night, pt does have a history of passing out pt states she has been told that they  are psychosomatic in nature  Allergies:  Allergies   Allergen Reactions    Morphine Anaphylaxis    Trazodone Anaphylaxis       DRUG AND ALCOHOL SCREEN     Please give a brief overview of your experience with drugs and alcohol: no reportable history. Drinks alcohol on special occassions maybe 6 times a year     Age of first use: n/a  Substances used: n/a  Date of last use: n/a  Substances used: n/a    Have you ever received treatment for addiction? no  If so, please give provider and dates of service: n/a  Are  you currently receiving substance abuse treatment or involved in self-help groups? no  If yes, please specify: n/a    REQUIRED SERVICES     What mental health services might be helpful to you? Please check all that apply:  []   [place] only: individual counseling and psychotherapy, group therapy, and medication evaluation/consultation for adults that experience barriers to functioning in their everyday lives.  [x]   [facility] Continued Recovery Support (CRS): classes, groups, and skills development opportunities to improve self care, life management, community living, social and work readiness skills.  []   [facility] Clinical Treatment: working with a prescriber, nurse, and therapist on medication and symptom management. (Must receive other [facility] services as well).  []   [facility] Intensive Rehabilitation (IR): short term service to work intensely on a rehabilitation goal such as achieving a life role or intensive symptom management to avoid losing a life role.  []   [facility] Ongoing Rehabilitation Support (ORS): vocational support for individual currently employed at least 10 hours per week.    Please mail or fax the information requested below along with Referral Form to:      [place]   [place] [address]   Phone: [phone] ext. [phone]   Fax: [place]   [place] [address]   Phone: [phone] ext.  [place]   Fax: [phone]      Referring Individual (please print): Alvis Lemmings, BSW  Referring Agency: [facility]  Phone: [phone]   Email: [email or web address]     Please include:   [x]  Clinical summary or current psychosocial history    [x]  Current treatment plan (if applicable)    []  Copies of insurance cards    [x]  Current medication log (if applicable)    []  Other psychiatric  services client receives (for example, outpatient group)       [email or web address]

## 2014-05-11 NOTE — Progress Notes (Signed)
Social Work Discharge Planning Note:    Probation officer met with pt at 11:30 am for 30 minutes. Pt was referred to Probation officer by [facility] therapist Saundra Shelling to discuss a referral to [facility]. Writer and pt discussed the program and the locations of the [employer] available. Pt agreed that a referral to the [facility] program would be beneficial to assist her to work on increasing her socialization, increasing healthy supports, learning coping skills to manage her anxiety, and work to rebuild family relationships. Pt signed a release of information form for [facility] program and was given a copy. The referral was completed and faxed to the intake coordinator Lynnae January, [phone]. The pt will attend orientation one at [facility] on 05-19-14 at 9 am to 1:40 pm and orientation two on 05-20-14 at 10 am to 12:30 pm.   The following appointments were made and transportation arranged as indicated below. The pt was given the below appointments and instructions in writing.   1.   05-19-14 at 9 am at the [facility] program with Lynnae January, [phone]. The address is [place] [address]. You are scheduled to be there from 9 am to 1:40 pm. [employer] [place] taxi is scheduled to pick you up at home at 8 am and will pick you up at [facility] at 1:40 pm to bring you home.   2. 05-20-14 at 10 am to 12:30 pm for orientation two at [facility]. [employer] [place] taxi is scheduled to pick you up at home at 9 am and will pick you up at [facility] at 12:30 pm to bring you home.   3. 05-23-14 at 2:40 pm with [name] NP at [facility]. [employer] [place] taxi is scheduled to pick you up at home at 1:40 pm. When you are done at your appointment call the cab to return to pick you up.   4. 05-25-14 at 10 am with Tresa Res - therapist at [facility]. [employer] [place] [employer] is scheduled to pick you up at home at 9 am. When you are done at your  appointment call the cab to return to pick you up.   For any transportation questions/concerns/changes call Medical Answering Service at [phone].   Pt is scheduled to complete PHP on 05-17-14.
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
## 2014-05-11 NOTE — Progress Notes (Signed)
Subjective:     Patient ID: Kaylee Harris is a 39 y.o. female.    HPI   Here with room mate;   Passed out 05/08/2014: 3 days ago: in bathroom after using the bathroom; fell hitting head on floor: forehead hit laminate floor.   Room mate heard a thump and went to check on her: syncopal episodes have happened before: has had 6-7 month of December.   Has been previously worked up: neurocardiogenic syncope. Unpredictable when it will happen.   Concern for repetitive head injuries with syncopal episodes. Looking into getting a helmet.   Seen at [facility] ED due to severity of HA and nausea following injury.      Headache is better today: 6/10 from initial 9/10. Nausea also improving, but still present. Keeping fluids down.   Has been resting, difficult when she must attend her mental health group, but room mate pushes her to go.  Very supportive. Denies other neuro symptoms.       Patient's medications, allergies, past medical, surgical, social and family histories were reviewed and updated     Review of Systems  Per above.     Objective:   Physical Exam  BP 120/90 mmHg   Pulse 99   Temp(Src) 36.4 C (97.5 F) (Temporal)   Ht 1.727 m (5\' 8" )   Wt 136.351 kg (300 lb 9.6 oz)   BMI 45.72 kg/m2  General appearance: alert, well appearing, and in no distress.  SKIN: warm, dry, good turgor. No rashes or lesions in visible areas. 1cm diameter bluish area of swelling to right side of forehead. + tender to touch.   EYE: PERRLA, conjunctiva clear, lids normal  ENMT: TMs normal, nares patent, lips without lesions, oropharynx clear. moist, good dentition  NECK:trachea midline, no masses, thyroid symmetrical with no lesion or nodules appreciated.  LYMPH: no cervical or supraclavicular LAD appreciated  RESPIRATORY: unlabored respiratory effort, lungs clear to auscultation, no wheezes, rales or rhonchi, symmetrical air entry.   CVS exam: normal rate, regular rhythm, normal S1, S2, no murmurs, rubs, clicks or gallops. No edema  Neurological:  Alert and oriented x3, steady gait. CNS II-XII intact.   Psych: alert and oriented x3, flat affect and mood, good eye contact, neatly dressed and groomed. Logical flow of thoughts and conversation.    Assessment/Plan  Concussion: vss[facility] visit and head CT. Symptoms  Improving. Reviewed post concussion syndrome and gradual improvement of symptoms. Consider wearing thick knit hat for extra protection around head for recurrent falls while waiting to hear about helmet from cardiology. F/u with PCP as scheduled. Sooner if symptoms worsen.

## 2014-05-12 ENCOUNTER — Ambulatory Visit: Payer: Self-pay

## 2014-05-12 DIAGNOSIS — F603 Borderline personality disorder: Secondary | ICD-10-CM

## 2014-05-12 DIAGNOSIS — F4310 Post-traumatic stress disorder, unspecified: Secondary | ICD-10-CM

## 2014-05-12 DIAGNOSIS — Z789 Other specified health status: Secondary | ICD-10-CM

## 2014-05-12 DIAGNOSIS — F332 Major depressive disorder, recurrent severe without psychotic features: Secondary | ICD-10-CM

## 2014-05-12 DIAGNOSIS — F419 Anxiety disorder, unspecified: Secondary | ICD-10-CM

## 2014-05-12 DIAGNOSIS — Z639 Problem related to primary support group, unspecified: Secondary | ICD-10-CM

## 2014-05-12 LAB — HEMOGLOBIN A1C: Hemoglobin A1C: 7.8 % — ABNORMAL HIGH (ref 4.0–6.0)

## 2014-05-12 MED ORDER — BENZTROPINE MESYLATE 1 MG PO TABS *I*
1.0000 mg | ORAL_TABLET | Freq: Every evening | ORAL | Status: DC
Start: 2014-05-12 — End: 2014-06-06

## 2014-05-12 NOTE — Progress Notes (Signed)
Behavioral Health Psychopharmacology Follow-up     Length of Session: 30 minutes.    Diagnosis Addressed    ICD-10-CM ICD-9-CM   1. 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 Anxiety disorder, unspecified anxiety disorder type F41.9 300.00   3. PTSD (post-traumatic stress disorder) F43.10 309.81   4. Borderline personality disorder F60.3 301.83   5. Known medical problems Z78.9 V49.89   6. Relationship problems F93.8 313.3       Recent History and Response to Medications  Patient states pt reports last night she started wishing her suicide attempt had worked. This occurred after an upsetting conversation with her wife. She felt safer knowing she did not have access to her pills. Pt reports she woke up today with no suicidal thoughts. Pt reports the benztropine has been "taking the edge off" the akathisia, but it has not completely resolved. Pt reports feeling anxious today because she is worried her parents won't come to friends and family group. She reports they cancelled plans to come last time she was in program.     Current use of alcohol or drugs: No      ENERGY: Fair, feeling tired, possibly due to recent concussion  CONCENTRATION: fair , it's a little difficult, has to reread papers after group  SLEEP: Normal.  Total hours sleep: 8  APPETITE: Good     WEIGHT: No Change  SEXUAL FUNCTION: not asked  ENJOYMENT/INTEREST: poor    Current Medications  Current Outpatient Prescriptions   Medication Sig    lurasidone (LATUDA) 40 MG tablet Take 1 tablet (40 mg total) by mouth daily    benztropine (COGENTIN) 0.5 MG tablet Take 1 tablet (0.5 mg total) by mouth nightly    Alcohol Swabs (ALCOHOL WIPES) PADS Use BID for BG check    diazepam (VALIUM) 5 MG tablet Take 1 tablet (5 mg total) by mouth nightly as needed for Anxiety   Max daily dose: 5 mg FOR ANXIETY MAX DAILY DOSE: 10 MG    levonorgestrel (MIRENA) 20 MCG/24HR IUD 1 each by Intrauterine route once    midodrine  (PROAMATINE) 5 MG tablet Take 1 tablet (5 mg total) by mouth 3 times daily    insulin glargine (LANTUS SOLOSTAR) 100 UNIT/ML injection pen Inject 22 Units into the skin nightly    mirtazapine (REMERON) 7.5 MG tablet Take 1 tablet (7.5 mg total) by mouth nightly    zolpidem (AMBIEN CR) 6.25 MG CR tablet Take 1 tablet (6.25 mg total) by mouth nightly as needed for Sleep   Max daily dose: 6.25 mg Swallow whole. Do not crush, break, or chew.    VENTOLIN HFA 108 (90 BASE) MCG/ACT inhaler Inhale 1-2 puffs into the lungs every 4-6 hours as needed for Wheezing    gabapentin (NEURONTIN) 800 MG tablet TAKE 1 TABLET (800 MG TOTAL) BY MOUTH 3 TIMES DAILY (Patient taking differently: Take 1 tablet (800 mg) twice DAILY  BID))    naproxen sodium (ANAPROX) 550 MG tablet TAKE 1 TABLET (550 MG TOTAL) BY MOUTH 2 TIMES DAILY (WITH MEALS)    busPIRone (BUSPAR) 15 MG tablet Take 1 tablet (15 mg total) by mouth 2 times daily    omeprazole (PRILOSEC) 20 MG capsule TAKE 1 CAPSULE (20 MG TOTAL) BY MOUTH DAILY (BEFORE BREAKFAST)    insulin pen needle (NOVOFINE) 30G X 8 MM Use 1 times a day as instructed.    Melatonin 5 MG CAPS Take 10 mg by mouth nightly  SUMAtriptan (IMITREX) 50 MG tablet Take 1 tablet (50 mg total) by mouth as needed for Migraine   Take at onset of headache. May repeat once in 2 hours.    fluticasone (FLONASE) 50 MCG/ACT nasal spray 1 spray by Nasal route daily    cyclobenzaprine (FLEXERIL) 5 MG tablet TAKE 1 TABLET (5 MG TOTAL) BY MOUTH 3 TIMES DAILY AS NEEDED FOR MUSCLE SPASMS    DULoxetine (CYMBALTA) 60 MG capsule Take 1 capsule (60 mg total) by mouth daily    dicyclomine (BENTYL) 20 MG tablet     topiramate (TOPAMAX) 50 MG tablet Take 1 tablet (50 mg total) by mouth 2 times daily    lancets Brand Free Style Lite; Use 2 times per day as directed for blood glucose testing.    FREESTYLE LITE test strip Use BID as directed for 250.02    famciclovir (FAMVIR) 500 MG tablet Take 1 tablet (500 mg total) by  mouth 3 times daily as needed    blood glucose monitor system Brand: cheapest brand available per her insurance.  Use as directed.    ranolazine (RANEXA) 500 MG 12 hr tablet Take 500 mg by mouth 2 times daily   Swallow whole. Do not crush, break, or chew.    Non-System Medication The above patient is followed in our clinic and cannot resume work permanently.    ondansetron (ZOFRAN) 8 MG tablet Take 1 tablet (8 mg total) by mouth 3 times daily as needed for Nausea     No current facility-administered medications for this visit.         Side Effects  Akathesia, improved partially with benztropine    Mental Status  APPEARANCE: Appears stated age, Well-groomed, ambulating with walker  ATTITUDE TOWARD INTERVIEWER: Cooperative  MOTOR ACTIVITY: WNL (within normal limits)  EYE CONTACT: Direct  SPEECH: Normal rate and tone  AFFECT: Constricted  MOOD: Anxious and Depressed  THOUGHT PROCESS: Normal  THOUGHT CONTENT: Negative Rumination  PERCEPTION: Within normal limits  ORIENTATION: Alert and Oriented X 3.  CONCENTRATION: WNL  MEMORY:   Recent: intact   Remote: intact  COGNITIVE FUNCTION: Average intelligence  JUDGMENT: Impaired -  mild  IMPULSE CONTROL: Intact  INSIGHT: Fair    Risk Assessment    Self Injury: Patient Denies  Suicidal Ideation: Patient Denies today, had thoughts last night of "wishing my attempt had worked"  Homicidal Ideation: Patient Denies  Aggressive Behavior: Patient Denies        Results  none    Assessment  FORMULATION:   Pt is demonstrating inconsistency in symptoms, likely reflective of her character pathology as her mood is prone to abrupt changes based on external triggers. She endorses feeling more labile since Latuda dose was decreased. Pt describes transient suicidal thoughts, though denies such thoughts currently, and continues to have her wife hold her pills for safety at home. Pt is circumstantially anxious today as she is hoping her parents follow through with their promise to come to the  friends and family group (she states they did not come to it as promised last time she was in program, so this is a realistic concern). Pt reports improved tolerability of Latuda since starting benztropine. Pt may benefit from increase in [place] as well as increase in benztropine to keep akathisia under better control. If tolerated, would consider discontinuation of mirtazapine next as low dose may not be providing antidepressant, side effect of weight gain would be undesirable as pt is already obese, and pt has been sleeping  well with Ambien CR.      Recommendations/Plan and Rationale  PLAN:   Increase Latuda back to 60mg  nightly  Increase benztropine to 1mg  nightly  Continue all other medications the same for now  Monitor for side effects and response to medications  Continue to provide psychoeducation regarding diagnosis, medications, and treatment  Psychotherapy continues as described in care plan  See [facility] for short and long term goals related to treatment of the reason for admission to the Partial Hospitalization Program.

## 2014-05-12 NOTE — Patient Instructions (Signed)
Increase benztropine (Cogentin) to 1mg  nightly (take with Latuda)    Increase Latuda back to 60mg  nightly

## 2014-05-13 ENCOUNTER — Encounter: Payer: Self-pay | Admitting: Psychologist

## 2014-05-13 ENCOUNTER — Ambulatory Visit: Payer: Self-pay

## 2014-05-13 MED ORDER — LURASIDONE HCL 60 MG PO TABS *A*
60.0000 mg | ORAL_TABLET | Freq: Every day | ORAL | Status: DC
Start: 2014-05-13 — End: 2014-05-17

## 2014-05-13 NOTE — Progress Notes (Signed)
Adult Partial Hospitalization Program  RN Medication Follow-up     Length of Session: 30 minutes.    Diagnosis Addressed  No diagnosis found.  PHP MD/NP prescriber notes reviewed: yes    Recent History and Response to Medications  Patient states she is feeling a little better, but still struggling with the anxiety and depression    Medication compliance: Yes  Current use of alcohol or drugs: no    ENERGY: Poor  CONCENTRATION: fair   SLEEP: Normal.  Total hours sleep: 8-9 hrs consecutively  APPETITE: eating too well, not making healthy food choices     WEIGHT: No Change  SEXUAL FUNCTION: decreased  ENJOYMENT/INTEREST: poor    Current Medications  Current Outpatient Prescriptions   Medication Sig    benztropine (COGENTIN) 1 MG tablet Take 1 tablet (1 mg total) by mouth nightly    lurasidone (LATUDA) 60 MG tablet Take 60 mg by mouth daily    Alcohol Swabs (ALCOHOL WIPES) PADS Use BID for BG check    diazepam (VALIUM) 5 MG tablet Take 1 tablet (5 mg total) by mouth nightly as needed for Anxiety   Max daily dose: 5 mg FOR ANXIETY MAX DAILY DOSE: 10 MG    levonorgestrel (MIRENA) 20 MCG/24HR IUD 1 each by Intrauterine route once    midodrine (PROAMATINE) 5 MG tablet Take 1 tablet (5 mg total) by mouth 3 times daily    insulin glargine (LANTUS SOLOSTAR) 100 UNIT/ML injection pen Inject 22 Units into the skin nightly    mirtazapine (REMERON) 7.5 MG tablet Take 1 tablet (7.5 mg total) by mouth nightly    zolpidem (AMBIEN CR) 6.25 MG CR tablet Take 1 tablet (6.25 mg total) by mouth nightly as needed for Sleep   Max daily dose: 6.25 mg Swallow whole. Do not crush, break, or chew.    VENTOLIN HFA 108 (90 BASE) MCG/ACT inhaler Inhale 1-2 puffs into the lungs every 4-6 hours as needed for Wheezing    gabapentin (NEURONTIN) 800 MG tablet TAKE 1 TABLET (800 MG TOTAL) BY MOUTH 3 TIMES DAILY (Patient taking differently: Take 1 tablet (800 mg) twice DAILY  BID))    naproxen sodium (ANAPROX) 550 MG tablet TAKE 1 TABLET (550  MG TOTAL) BY MOUTH 2 TIMES DAILY (WITH MEALS)    busPIRone (BUSPAR) 15 MG tablet Take 1 tablet (15 mg total) by mouth 2 times daily    omeprazole (PRILOSEC) 20 MG capsule TAKE 1 CAPSULE (20 MG TOTAL) BY MOUTH DAILY (BEFORE BREAKFAST)    insulin pen needle (NOVOFINE) 30G X 8 MM Use 1 times a day as instructed.    Melatonin 5 MG CAPS Take 10 mg by mouth nightly    SUMAtriptan (IMITREX) 50 MG tablet Take 1 tablet (50 mg total) by mouth as needed for Migraine   Take at onset of headache. May repeat once in 2 hours.    fluticasone (FLONASE) 50 MCG/ACT nasal spray 1 spray by Nasal route daily    cyclobenzaprine (FLEXERIL) 5 MG tablet TAKE 1 TABLET (5 MG TOTAL) BY MOUTH 3 TIMES DAILY AS NEEDED FOR MUSCLE SPASMS    DULoxetine (CYMBALTA) 60 MG capsule Take 1 capsule (60 mg total) by mouth daily    dicyclomine (BENTYL) 20 MG tablet     topiramate (TOPAMAX) 50 MG tablet Take 1 tablet (50 mg total) by mouth 2 times daily    lancets Brand Free Style Lite; Use 2 times per day as directed for blood glucose testing.    FREESTYLE  LITE test strip Use BID as directed for 250.02    famciclovir (FAMVIR) 500 MG tablet Take 1 tablet (500 mg total) by mouth 3 times daily as needed    blood glucose monitor system Brand: cheapest brand available per her insurance.  Use as directed.    ranolazine (RANEXA) 500 MG 12 hr tablet Take 500 mg by mouth 2 times daily   Swallow whole. Do not crush, break, or chew.    Non-System Medication The above patient is followed in our clinic and cannot resume work permanently.    ondansetron (ZOFRAN) 8 MG tablet Take 1 tablet (8 mg total) by mouth 3 times daily as needed for Nausea     No current facility-administered medications for this visit.     Side Effects reported/observed  Akathesia, dry mouth    Mental Status  APPEARANCE: Casual  ATTITUDE TOWARD INTERVIEWER: Cooperative  MOTOR ACTIVITY: WNL (within normal limits)  EYE CONTACT: Direct  SPEECH: Normal rate and tone  AFFECT:  Anxious  MOOD: Depressed  THOUGHT PROCESS: Normal  THOUGHT CONTENT: Negative Rumination  PERCEPTION: reports hearing "music " at night  ORIENTATION: Alert and Oriented X 3.  CONCENTRATION: Fair  MEMORY:   Recent: intact   Remote: intact  COGNITIVE FUNCTION: Average intelligence  JUDGMENT: Intact  IMPULSE CONTROL: Intact  INSIGHT: Limited    Risk Assessment  Self Injury: Patient Denies  Suicidal Ideation: chronic suicidal thoughts, denies intent  Homicidal Ideation: Patient Denies  Aggressive Behavior: Patient Denies    Results  none    Patient Education  Purposed, dosage, route, and time of psychiatric medications reviewed.  Patient has basic knowledge @ prescribed medications    Assessment  FORMULATION: Patient continues depressed and anxious, struggling with lack of motivation, energy, fatigue. Sleeping well. Good appetite, though eating poorly ( junk food), non compliant with diabetic regimen. Tolerating Latuda with the addition of Cogentin to manage her akathesia. Admits to struggling with her recent losses and physical limitations, having difficulty applying the coping strategies. Marland Kitchen Continues with chronic passive suicidal ideation, denies intent at present, feels like a burden to her family. Reports her spouse is overseeing her medications. Follow up plans are in place for patient to attend [facility] to provide additional structure  reinforce skills and support    Recommendations/Plan and Rationale  PLAN: Discontinue Remeron, continue to monitor response to increase in [place], consider increasing Latuda to 80 mg daily prior to discharge next week.

## 2014-05-15 ENCOUNTER — Encounter: Payer: Self-pay | Admitting: Primary Care

## 2014-05-16 ENCOUNTER — Ambulatory Visit: Payer: Self-pay

## 2014-05-16 ENCOUNTER — Other Ambulatory Visit: Payer: Self-pay | Admitting: Primary Care

## 2014-05-16 ENCOUNTER — Encounter: Payer: Self-pay | Admitting: Gastroenterology

## 2014-05-16 MED ORDER — FREESTYLE LITE TEST VI STRP *A*
ORAL_STRIP | Status: DC
Start: 2014-05-16 — End: 2014-08-22

## 2014-05-16 MED ORDER — LANCETS MISC *A*
Status: DC
Start: 2014-05-16 — End: 2014-10-31

## 2014-05-17 ENCOUNTER — Encounter: Payer: Self-pay | Admitting: Primary Care

## 2014-05-17 ENCOUNTER — Other Ambulatory Visit: Payer: Self-pay | Admitting: Psychiatry

## 2014-05-17 ENCOUNTER — Ambulatory Visit: Payer: Self-pay

## 2014-05-17 MED ORDER — BUSPIRONE HCL 10 MG PO TABS *I*
10.0000 mg | ORAL_TABLET | Freq: Every day | ORAL | Status: DC
Start: 2014-05-17 — End: 2014-06-06

## 2014-05-17 MED ORDER — LURASIDONE HCL 80 MG PO TABS *I*
80.0000 mg | ORAL_TABLET | Freq: Every day | ORAL | Status: DC
Start: 2014-05-17 — End: 2014-06-15

## 2014-05-17 MED ORDER — DIAZEPAM 5 MG PO TABS *I*
5.0000 mg | ORAL_TABLET | Freq: Every evening | ORAL | Status: DC | PRN
Start: 2014-05-17 — End: 2014-05-23

## 2014-05-17 NOTE — Progress Notes (Addendum)
NP Progress Note    Met with pt at her request. Pt reports her anxiety has been worse this week. She reports she has been taking the Valium twice a day and is worried about depending on it too much. Pt reports it may be related to discussion of triggering topics in group. She reports she was particularly triggered by a discussion about setting boundaries. Reports she is worrying about setting boundaries with her mother-in-law about spending time with her wife's family. Pt feels she really needs to avoid seeing her father-in-law for now because it is a setback whenever she sees him. However she is worried about her mother-in-law's reaction to this.     Noted that pt saw her cardiologist yesterday and is being taken off midodrine and Ranexa. She was given a 1 week supply of both medications and then she is supposed to stop them. She reports she is anxious about how this will affect her syncopal episodes. However, the midodrine has been gradually tapering and she has tolerated this well so far. She reports she has had no syncopal episodes since 05/08/14.     Pt reports tolerating discontinuation of mirtazapine so far, though notes she has had a few incidents of sleep interruption since stopping it. Possibly related to having to go to the bathroom.      While the increased anxiety seems to be largely situational, pt may benefit from further titration of buspirone and Latuda. Pt reports the akathisia has remained under good control with the benztropine 671m. Will plan to increase Latuda to 850mtoday for more therapeutic dose. Will continue benztropine 71m35mightly. Will also try adding mid-day dose of buspirone to further benefit anxiety. Discussed adding 26m45md-day dose for 1 week, continuing 15mg68mthe morning and evening, and then increase to 15mg 38mnext week. Encouraged pt to try reducing Valium back to once daily, encouraged her that the adjustments to Latuda and buspirone will hopefully stabilize mood and anxiety  so that Valium is not needed long-term.     Provided prescriptions for Latuda 80mg #371mablets, buspirone 26mg #720mlets, (will refill 15mg str57mh once tolerability to mid-day dose is assessed), diazepam 5mg #7 ta12mts. [facility]mith[facility]/16 for follow-up.

## 2014-05-17 NOTE — Patient Instructions (Signed)
Continue buspirone (Buspar) 15mg  twice daily, add 10mg  in the middle of the day for 1 week. Next week, increase the dose to 15mg  three times daily    Increase Latuda to 80mg  with dinner    Try using diazepam (Valium) 5mg  once daily as needed for anxiety

## 2014-05-18 ENCOUNTER — Ambulatory Visit: Payer: Self-pay | Admitting: Family Medicine

## 2014-05-18 ENCOUNTER — Encounter: Payer: Self-pay | Admitting: Family Medicine

## 2014-05-18 ENCOUNTER — Ambulatory Visit: Payer: Self-pay

## 2014-05-18 ENCOUNTER — Other Ambulatory Visit: Payer: Self-pay | Admitting: Psychiatry

## 2014-05-18 ENCOUNTER — Encounter: Payer: Self-pay | Admitting: Gastroenterology

## 2014-05-18 VITALS — BP 130/88 | HR 84 | Temp 98.2°F | Ht 68.11 in | Wt 300.0 lb

## 2014-05-18 DIAGNOSIS — R899 Unspecified abnormal finding in specimens from other organs, systems and tissues: Secondary | ICD-10-CM

## 2014-05-18 DIAGNOSIS — F419 Anxiety disorder, unspecified: Secondary | ICD-10-CM

## 2014-05-18 DIAGNOSIS — F332 Major depressive disorder, recurrent severe without psychotic features: Secondary | ICD-10-CM

## 2014-05-18 DIAGNOSIS — Z639 Problem related to primary support group, unspecified: Secondary | ICD-10-CM

## 2014-05-18 DIAGNOSIS — E119 Type 2 diabetes mellitus without complications: Secondary | ICD-10-CM

## 2014-05-18 DIAGNOSIS — Z789 Other specified health status: Secondary | ICD-10-CM

## 2014-05-18 DIAGNOSIS — F603 Borderline personality disorder: Secondary | ICD-10-CM

## 2014-05-18 MED ORDER — BUSPIRONE HCL 15 MG PO TABS *I*
15.0000 mg | ORAL_TABLET | Freq: Three times a day (TID) | ORAL | Status: DC
Start: 2014-05-25 — End: 2014-06-15

## 2014-05-18 MED ORDER — DULOXETINE HCL 60 MG PO CPEP *I*
60.0000 mg | DELAYED_RELEASE_CAPSULE | Freq: Every day | ORAL | Status: DC
Start: 2014-05-18 — End: 2014-08-08

## 2014-05-18 NOTE — Progress Notes (Signed)
NP Progress Note     Met with pt to assess discharge needs. Pt reports she tolerated the higher dose of Latuda last night with no recurrence of akathisia. She reports she took the buspirone 52m at lunchtime and feels that her anxiety is a little better this afternoon, though possibly too soon to tell benefit. She denies adverse effect so far; will go forward with plan to increase buspirone to 177mTID on 05/25/14. Provided pt with a 30 day supply of buspirone [id number] (#90) and left message for pt's pharmacy explaining the plan to try 36m60mD with 10mg70mlunchtime for 1 week, then increase to 36mg 18mon 05/25/14.    Pt reports she did have some sleep interruption again last night, discussed monitoring this over the weekend. She has an appointment with Lisa CThea Silversmith Monday, encouraged pt to let Lisa kLattie Hawat this appointment if the sleep interruption continued. Mirtazapine may need to be restarted.     Pt reports she did have a conversation with her wife last night about needing to talk to her mother-in-law about not seeing the father-in-law for awhile. Pt reports SherryJudeen Hammansupportive.    Pt denies suicidal ideation, plan, or intent today, though continues to report fleeting feelings that she wished her suicide attempt had worked. These feelings seem to be directly influenced by triggers such as upsetting conversations, or having her syncopal episodes and feeling "like a burden." These are most likely related to pt's character pathology. Discussed rejoining a DBT group. Discussed that pt will be attending [facility], has her intake tomorrow, this will likely be very beneficial in offering more support and helping pt work toward long-term goals. Pt is future-oriented at this time and [facility]
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
## 2014-05-18 NOTE — Progress Notes (Signed)
Adult Partial Hospitalization Program Discharge Instructions     Name: Kaylee Harris  MRN: [id number]   DOB: 1975/08/17    DISCHARGE INSTRUCTIONS:  R PSY DATE ADMITTED 05/05/2014   DATE ADMITTED TO CURRENT [facility] PROGRAM 05/04/2014      R PSY DC DATE 05/05/2014   DISCHARGE DATE 05/18/2014     We have provided enough medication to last you until your next psychiatric assessment.  Refills or changes in medication must be done by your new Outpatient Provider.  It is VITAL that you keep your scheduled appointments.    See attached MEDICATION LIST     Current Outpatient Prescriptions   Medication Sig    DULoxetine (CYMBALTA) 60 MG capsule Take 1 capsule (60 mg total) by mouth daily    midodrine (PROAMATINE) 5 MG tablet Take 5 mg by mouth daily    diazepam (VALIUM) 5 MG tablet Take 1 tablet (5 mg total) by mouth nightly as needed for Anxiety   Max daily dose: 5 mg    lurasidone (LATUDA) 80 MG tablet Take 1 tablet (80 mg total) by mouth daily    busPIRone (BUSPAR) 10 MG tablet Take 1 tablet (10 mg total) by mouth daily (with lunch)    lancets Brand Free Style Lite; Use 2 times per day as directed for blood glucose testing.    FREESTYLE LITE test strip Use BID as directed for 250.02    benztropine (COGENTIN) 1 MG tablet Take 1 tablet (1 mg total) by mouth nightly    Alcohol Swabs (ALCOHOL WIPES) PADS Use BID for BG check    levonorgestrel (MIRENA) 20 MCG/24HR IUD 1 each by Intrauterine route once    insulin glargine (LANTUS SOLOSTAR) 100 UNIT/ML injection pen Inject 22 Units into the skin nightly    zolpidem (AMBIEN CR) 6.25 MG CR tablet Take 1 tablet (6.25 mg total) by mouth nightly as needed for Sleep   Max daily dose: 6.25 mg Swallow whole. Do not crush, break, or chew.    VENTOLIN HFA 108 (90 BASE) MCG/ACT inhaler Inhale 1-2 puffs into the lungs every 4-6 hours as needed for Wheezing    gabapentin (NEURONTIN) 800 MG tablet TAKE 1 TABLET (800 MG TOTAL) BY MOUTH 3 TIMES DAILY (Patient taking differently: Take 1 tablet  (800 mg) twice DAILY  BID))    naproxen sodium (ANAPROX) 550 MG tablet TAKE 1 TABLET (550 MG TOTAL) BY MOUTH 2 TIMES DAILY (WITH MEALS)    busPIRone (BUSPAR) 15 MG tablet Take 1 tablet (15 mg total) by mouth 2 times daily    omeprazole (PRILOSEC) 20 MG capsule TAKE 1 CAPSULE (20 MG TOTAL) BY MOUTH DAILY (BEFORE BREAKFAST)    insulin pen needle (NOVOFINE) 30G X 8 MM Use 1 times a day as instructed.    Melatonin 5 MG CAPS Take 10 mg by mouth nightly    SUMAtriptan (IMITREX) 50 MG tablet Take 1 tablet (50 mg total) by mouth as needed for Migraine   Take at onset of headache. May repeat once in 2 hours.    fluticasone (FLONASE) 50 MCG/ACT nasal spray 1 spray by Nasal route daily    cyclobenzaprine (FLEXERIL) 5 MG tablet TAKE 1 TABLET (5 MG TOTAL) BY MOUTH 3 TIMES DAILY AS NEEDED FOR MUSCLE SPASMS    dicyclomine (BENTYL) 20 MG tablet     topiramate (TOPAMAX) 50 MG tablet Take 1 tablet (50 mg total) by mouth 2 times daily    famciclovir (FAMVIR) 500 MG tablet Take 1 tablet (500 mg  total) by mouth 3 times daily as needed    blood glucose monitor system Brand: cheapest brand available per her insurance.  Use as directed.    ranolazine (RANEXA) 500 MG 12 hr tablet Take 500 mg by mouth 2 times daily   Swallow whole. Do not crush, break, or chew.    Non-System Medication The above patient is followed in our clinic and cannot resume work permanently.    ondansetron (ZOFRAN) 8 MG tablet Take 1 tablet (8 mg total) by mouth 3 times daily as needed for Nausea     No current facility-administered medications for this visit.     Patient Instructions  FOLLOW-UP PROVIDER APPOINTMENTS:  R PSY F/U TYPE 05/04/2014   APPT TYPE Therapist      R PSY F/U NAME 05/04/2014   APPT NAME Tresa Res, LMSW   Comments -     Date Entered 05/11/2014   APPT DATE 05/25/2014     Date Entered 05/11/2014   APPT TIME 10:00 AM     R PSY F/U PHONE 05/09/2014   APPT PHONE # [phone]     R PSY F/U FAX 05/05/2014   APPT FAX # in e record     R PSY F/U  ADDRESS 05/05/2014   APPT ADDRESS [facility]  [place]     -------------------------------------------------------------  R PSY F/U TYPE 05/05/2014   APPT TYPE NP      Date Entered 05/04/2014   APPT NAME [name], NP     Date Entered 05/09/2014   APPT DATE 05/23/2014     Date Entered 05/09/2014   APPT TIME  2:40 PM     Date Entered 05/09/2014   APPT PHONE # [phone]     R PSY F/U FAX 2 05/05/2014   APPT FAX # in e record     Date Entered 05/05/2014   APPT ADDRESS [facility]  [place]     -------------------------------------------------------------  Date Entered 05/05/2014   APPT TYPE PCP      Date Entered 05/05/2014   APPT NAME Dr [name]     Date Entered 05/05/2014   APPT PHONE # [phone]     R PSY F/U FAX 3 05/05/2014   APPT FAX # [phone]     Date Entered 05/05/2014   APPT ADDRESS [place]     -------------------------------------------------------------  R PSY F/U TYPE 4 05/11/2014   APPT TYPE Other      R PSY F/U NAME 4 05/11/2014   APPT NAME [facility] [place]      R PSY F/U DATE 4 05/11/2014   APPT DATE 05/19/2014     R PSY F/U TIME 4 05/11/2014   APPT TIME  9:00 AM     R PSY F/U PHONE 4 05/11/2014   APPT PHONE # [phone]     R PSY F/U FAX 4 05/11/2014   APPT FAX # [phone]     R PSY F/U ADDRESS 4 05/11/2014   APPT ADDRESS [place] [id number]. You are scheduled for orientation one on 05-19-14 at 9 am to 1:40 pm. [facility] is scheduled to pick you up at home at 8 am to bring you to the apointment and pick you up at [facility] at 1:40 pm to bring you home. You are scheduled for orientation two on 05-20-14 at 10 am to 12:30 pm. [facility] is scheduled to pick you up at home at 9 am to bring you to [facility] and then home again at 12:30 pm. Bring your insurance card and photo ID to the appointment.        REASON FOR DISCHARGE:  No longer  requires this level of care    GENERAL INSTRUCTIONS: Keep Scheduled Appointments    [facility] Helpline and Mobile Crisis Team: (24 hour/7 days)  [phone]; [phone]; (Out of [place])  [phone]  The above information has been discussed with me and I have received a copy.  I understand that I am advised to follow the instructions given to me at appropriately care for my condition.     _____________________________________________________________  Patient Signature:     _____________________________________________________________  Date:    ______________________________________________________________  Patient Representative Signature, if required:    ______________________________________________________________  Date:

## 2014-05-18 NOTE — Patient Instructions (Signed)
A1c continues to be high: increase lantus tonite to 24 units. Check fasting glucose in mornings.   Increase lantus every 2 days for glucose >150    Secretaries will help set up appointment with Diabetic Educator: Veronia Beets RN.

## 2014-05-19 NOTE — Progress Notes (Signed)
Subjective:     Patient ID: Kaylee Harris is a 39 y.o. female.    HPI  Here with partner: would like to review recent labs.   Seen in ED 12/27 and 28 for head injury/syncopal episode. Has reviewed labs on mychart and has several questions about what they mean and why they were drawn.     DMII: has been taking lantus 22 units nitely. Fasting glucose has been around 200. She and partner struggle with what she can eat and how much to eat. Do not remember meeting with educator previously about DM and would like this if available.   Has tried to avoid sugary foods, several medications she is on cause weight gain and she is aware of this.   Difficult exercising due to frequent syncopal episodes.     Patient's medications, allergies, past medical, surgical, social and family histories were reviewed and updated    Review of Systems  Per above    Objective:   Physical Exam  No formal physical exam done; time spent counseling/educating about DM and lab results    Assessment/Plan:  1. DMII: poor control: reviewed recent labs and increase of A1c from 6.7 to 7.8 as well as elevated trigycerides. Admits to poor diet compliance. Instructed on titration of nitely lantus dose to reach goal of fasting glucose </= 150. Will also refer her to Diabetic Educator Gay Filler Norquist for diabetic education. Recommended searching on line for chair aerobic exercises: exercising while sitting in chair using more arm than leg movements. Partner will look into this.  ALso recommended reviewing American Diabetes Association Web Site. F/u witn PCP as scheduled 1 month or sooner if needed.  2. Abnormal labs: reviewed labs from 12/27 and 12/28 ED visit: answered multiple questions on labs that were completed and why they were likely ordered. Reviewed lipid panel and elevated triglycerides; possible due to elevated glucose levels as well as mental health medication.

## 2014-05-20 ENCOUNTER — Ambulatory Visit: Payer: Self-pay

## 2014-05-23 ENCOUNTER — Telehealth: Payer: Self-pay

## 2014-05-23 ENCOUNTER — Encounter: Payer: Self-pay | Admitting: Psychiatry

## 2014-05-23 ENCOUNTER — Ambulatory Visit: Payer: Self-pay | Admitting: Psychiatry

## 2014-05-23 VITALS — BP 148/87 | HR 92 | Ht 68.11 in | Wt 294.0 lb

## 2014-05-23 DIAGNOSIS — F332 Major depressive disorder, recurrent severe without psychotic features: Secondary | ICD-10-CM

## 2014-05-23 DIAGNOSIS — F603 Borderline personality disorder: Secondary | ICD-10-CM

## 2014-05-23 DIAGNOSIS — F429 Obsessive-compulsive disorder, unspecified: Secondary | ICD-10-CM

## 2014-05-23 DIAGNOSIS — F458 Other somatoform disorders: Secondary | ICD-10-CM

## 2014-05-23 DIAGNOSIS — F4310 Post-traumatic stress disorder, unspecified: Secondary | ICD-10-CM

## 2014-05-23 MED ORDER — CLONAZEPAM 0.5 MG PO TABS *I*
0.5000 mg | ORAL_TABLET | Freq: Every day | ORAL | Status: DC | PRN
Start: 2014-05-23 — End: 2014-08-24

## 2014-05-23 NOTE — Progress Notes (Addendum)
Behavioral Health Psychopharmacology Follow-up     Length of Session: 30 minutes.    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 OCD (obsessive compulsive disorder) F42 300.3   4. Borderline personality disorder F60.3 301.83   5. PTSD (post-traumatic stress disorder) F43.10 309.81       Recent History and Response to Medications  Patient states:   HPI     Follow-up    Additional comments: patient here for medication evaluation             Current use of alcohol or drugs: No        Neurovegetative Symptoms Review:  Energy level: fair  Concentration: poor  Sleep Quality: fair (awakens during night, difficulty falling back asleep)   Number of hours : 6  Appetite: fair (still hungry for junk food, struggling with good food)     Wt Readings from Last 3 Encounters:   05/23/14 133.358 kg (294 lb)   05/18/14 136.079 kg (300 lb)   05/11/14 136.351 kg (300 lb 9.6 oz)     Enjoyment/interest: poor        Current Medications  Current Outpatient Prescriptions   Medication Sig    DULoxetine (CYMBALTA) 60 MG capsule Take 1 capsule (60 mg total) by mouth daily    [START ON 05/25/2014] busPIRone (BUSPAR) 15 MG tablet Take 1 tablet (15 mg total) by mouth 3 times daily    midodrine (PROAMATINE) 5 MG tablet Take 5 mg by mouth daily    diazepam (VALIUM) 5 MG tablet Take 1 tablet (5 mg total) by mouth nightly as needed for Anxiety   Max daily dose: 5 mg    lurasidone (LATUDA) 80 MG tablet Take 1 tablet (80 mg total) by mouth daily    busPIRone (BUSPAR) 10 MG tablet Take 1 tablet (10 mg total) by mouth daily (with lunch) (Patient taking differently: Take 10 mg by mouth daily (with lunch)   For 1 week, then increase midday dose to 15mg  on 05/25/14 for total daily dose of 15mg  TID)    lancets Brand Free Style Lite; Use 2 times per day as directed for blood glucose testing.    FREESTYLE LITE test strip Use  BID as directed for 250.02    benztropine (COGENTIN) 1 MG tablet Take 1 tablet (1 mg total) by mouth nightly    Alcohol Swabs (ALCOHOL WIPES) PADS Use BID for BG check    levonorgestrel (MIRENA) 20 MCG/24HR IUD 1 each by Intrauterine route once    insulin glargine (LANTUS SOLOSTAR) 100 UNIT/ML injection pen Inject 22 Units into the skin nightly    zolpidem (AMBIEN CR) 6.25 MG CR tablet Take 1 tablet (6.25 mg total) by mouth nightly as needed for Sleep   Max daily dose: 6.25 mg Swallow whole. Do not crush, break, or chew.    gabapentin (NEURONTIN) 800 MG tablet TAKE 1 TABLET (800 MG TOTAL) BY MOUTH 3 TIMES DAILY (Patient taking differently: Take 1 tablet (800 mg) twice DAILY  BID))    naproxen sodium (ANAPROX) 550 MG tablet TAKE 1 TABLET (550 MG TOTAL) BY MOUTH 2 TIMES DAILY (WITH MEALS)    omeprazole (PRILOSEC) 20 MG capsule TAKE 1 CAPSULE (20 MG TOTAL) BY MOUTH DAILY (BEFORE BREAKFAST)    insulin pen needle (NOVOFINE) 30G X 8 MM Use 1 times a day as instructed.    Melatonin 5  MG CAPS Take 10 mg by mouth nightly    SUMAtriptan (IMITREX) 50 MG tablet Take 1 tablet (50 mg total) by mouth as needed for Migraine   Take at onset of headache. May repeat once in 2 hours.    fluticasone (FLONASE) 50 MCG/ACT nasal spray 1 spray by Nasal route daily    dicyclomine (BENTYL) 20 MG tablet     topiramate (TOPAMAX) 50 MG tablet Take 1 tablet (50 mg total) by mouth 2 times daily    blood glucose monitor system Brand: cheapest brand available per her insurance.  Use as directed.    Non-System Medication The above patient is followed in our clinic and cannot resume work permanently.    VENTOLIN HFA 108 (90 BASE) MCG/ACT inhaler Inhale 1-2 puffs into the lungs every 4-6 hours as needed for Wheezing    cyclobenzaprine (FLEXERIL) 5 MG tablet TAKE 1 TABLET (5 MG TOTAL) BY MOUTH 3 TIMES DAILY AS NEEDED FOR MUSCLE SPASMS    famciclovir (FAMVIR) 500 MG tablet Take 1 tablet (500 mg total) by mouth 3 times daily as needed     ondansetron (ZOFRAN) 8 MG tablet Take 1 tablet (8 mg total) by mouth 3 times daily as needed for Nausea     No current facility-administered medications for this visit.       Side Effects  Patient Reported Side Effects: None reported    Mental Status  APPEARANCE: Casual  ATTITUDE TOWARD INTERVIEWER: Cooperative  MOTOR ACTIVITY: WNL (within normal limits)  EYE CONTACT: Avoidant  SPEECH: Normal rate and tone  AFFECT: Decreased Range and Appropriate  MOOD: Anxious and Depressed  THOUGHT PROCESS: Normal  THOUGHT CONTENT: No unusual themes  PERCEPTION: Hallucinations Auditory and hearing music  ORIENTATION: Alert and Oriented X 3.  CONCENTRATION: Fair  MEMORY:   Recent: intact   Remote: intact  COGNITIVE FUNCTION: Average intelligence  JUDGMENT: Intact  IMPULSE CONTROL: Fair  INSIGHT: Fair    Risk Assessment    Self Injury: Patient Denies  Suicidal Ideation: Yes. Describe: Still has thoughts that she wishes that the overdose had worked.  Says all her medications are now locked up which keeps her from doing anything.  Homicidal Ideation: Patient Denies  Aggressive Behavior: Patient Denies    Results  Results for Kaylee Harris, Kaylee Harris (MRN [id number]) as of 05/23/2014 14:50   Ref. Range 05/11/2014 16:34   Cholesterol Latest Units: mg/dL 240 (A)   Triglycerides Latest Units: mg/dL 425 (A)   HDL Latest Units: mg/dL 36   LDL Calculated Latest Units: mg/dL see below   LDL Direct Latest Units: mg/dL 165   Non HDL Cholesterol Latest Units: mg/dL 204   Chol/HDL Ratio No range found 6.7   Hemoglobin A1C Latest Range: 4.0-6.0 % 7.8 (H)       BP Readings from Last 3 Encounters:   05/23/14 148/87   05/18/14 130/88   05/11/14 120/90       Assessment  FORMULATION: Patient reports she has been unable to sleep since the mirtazapine was discontinued.  She can fall asleep with the Ambien but does not stay asleep.  We discussed restarting the Mirtazapine.  She was started on Buspar while in PCP and the Valium was continued there.  We discussed  changing to Klonopin prn since it is more long acting until the Buspar is at full dose to which she agree.  She reports that the Buspar does seem to be helping some with her anxiety.  Her last episode of dizziness was 8  days ago.  Writer recommended she wear a helmet for now.  Writer did a lot of psycho education about somatoform disorders and her diagnosis of psychogenic dizziness.  Patient seemed more accepting of the diagnosis after we discussed. It.      Recommendations/Plan and Rationale  PLAN: Continue Buspar titration as previously ordered.  Restart Mirtazapine 7.5 mg nightly.  Begin Klonopin 0.5 mg daily prn for anxiety.  Discontinue Valium.  Return in 2 weeks.

## 2014-05-23 NOTE — Telephone Encounter (Signed)
Pt had to cancel her appt for 1/20 because she had another appt...she rescheduled for 1/26 @ 4

## 2014-05-24 ENCOUNTER — Other Ambulatory Visit: Payer: Self-pay | Admitting: Primary Care

## 2014-05-24 ENCOUNTER — Other Ambulatory Visit: Payer: Self-pay | Admitting: Psychiatry

## 2014-05-24 ENCOUNTER — Other Ambulatory Visit: Payer: Self-pay | Admitting: Neurology

## 2014-05-24 MED ORDER — OMEPRAZOLE 20 MG PO CPDR *I*
20.0000 mg | DELAYED_RELEASE_CAPSULE | Freq: Every day | ORAL | Status: DC
Start: 2014-05-24 — End: 2014-07-14

## 2014-05-24 MED ORDER — INSULIN PEN NEEDLE 30G X 8 MM MISC *A*
Status: DC
Start: 2014-05-24 — End: 2014-10-04

## 2014-05-24 MED ORDER — INSULIN GLARGINE 100 UNIT/ML SC SOPN *I*
22.0000 [IU] | PEN_INJECTOR | Freq: Every evening | SUBCUTANEOUS | Status: DC
Start: 2014-05-24 — End: 2014-06-09

## 2014-05-25 ENCOUNTER — Ambulatory Visit: Payer: Self-pay

## 2014-05-26 ENCOUNTER — Other Ambulatory Visit: Payer: Self-pay | Admitting: Psychiatry

## 2014-05-26 MED ORDER — ZOLPIDEM TARTRATE 6.25 MG PO TBCR *A*
6.2500 mg | ORAL_TABLET | Freq: Every evening | ORAL | Status: DC | PRN
Start: 2014-05-26 — End: 2014-08-08

## 2014-05-27 ENCOUNTER — Encounter: Payer: Self-pay | Admitting: Gastroenterology

## 2014-05-30 ENCOUNTER — Telehealth: Payer: Self-pay

## 2014-05-30 NOTE — Telephone Encounter (Signed)
LVM for pt to schedule an appointment with office for ED f/u for [facility] from syncopal episode needs to be seen in 3-5 days.

## 2014-05-31 ENCOUNTER — Encounter: Payer: Self-pay | Admitting: Gastroenterology

## 2014-05-31 ENCOUNTER — Ambulatory Visit: Payer: Self-pay

## 2014-05-31 NOTE — Progress Notes (Signed)
[place] MISSED/CANCELLED APPOINTMENT     Name: Kaylee Harris  MRN: [id number]   DOB: January 26, 1976    Date of Scheduled Service: 05/31/14    [name] was a no show for today's appointment.  I have left the patient a message to contact me.    Additional Information:    Writer notified patient's psychopharm provider.

## 2014-05-31 NOTE — BH Treatment Plan (Signed)
[facility] Treatment Plan     Date of Plan:   R PSY TP DATE FROM 05/31/2014   FROM 05/31/2014      R PSY TP DATE TO 05/31/2014   TO 08/29/2014       Diagnostic Impression    ICD-10-CM ICD-9-CM   1. MDD (major depressive disorder), recurrent severe, without psychosis F33.2 296.33   2. OCD (obsessive compulsive disorder) F42 300.3   3. Borderline personality disorder F60.3 301.83   4. PTSD (post-traumatic stress disorder) F43.10 309.81   5. Conversion disorder F44.9 300.11       Strengths  Strengths derived from the assessment include: Patient is hard working, has supports.    Problem Areas  *At least one problem must be targeted toward risk reduction if Formulation of Risk or any other previous exam indicated special monitoring or intervention for suicide and/or violence risk indicated.    PROBLEM AREAS (choose and describe relevant):  THOUGHT: Negative  MOOD:Depressed and anxious  BEHAVIOR: Reactive and avoidant  ECONOMIC:SSDI  ________________________________________________________________  R PSY TP PROBLEM 05/31/2014   1ST TREATMENT PLAN PROBLEM Depression and anxiety        R PSY TP GOAL 05/31/2014   1ST TREATMENT PLAN GOAL Learn DBT and PST skills to manage affect       The rationale for addressing this problem is that resolving it will (select all that apply):  Reduce symptoms of disorder, Reduce functional impairment associated with disorder, Decrease likelihood of hospitalization, Facilitate transfer skills learned in therapy to everday life, Is a key motivational factor for the patient's participation in treatment, Reduce risk for suicide* and Reduce risk for violence*      Progress toward goal(s): Problem worsening.  Comment Patient has been out of treatment with Probation officer and was last seen by Probation officer on 03/18/14. Patient was hospitalized on 05/01/14 for taking an excessive amount of medication. Patient engaged in [facility] again. Patient has been seen by her medication provider, but has not returned to  treatment with writer.    1 a. Measurable Objectives : Patient will make and keep regular individual therapy appointments.   Date established: 05/31/14   Target date: 08/29/14   Attained or Revised? new    1 b. Measurable Objectives : Patient will continue in [place] skills group therapy   Date established: 05/31/14   Target date: 08/29/14   Attained or Revised? new    1 c. Measurable Objectives : Patient will continue to learn and utilize skills to manage affect.   Date established: 05/31/14   Target date: 08/29/14   Attained or Revised? new    R PSY TP PROBLEM 2 05/31/2014   2ND TREATMENT PLAN PROBLEM Negative coping strategies       R PSY TP GOAL 2 05/31/2014   2ND TREATMENT PLAN GOAL Utilize adaptive coping strategies       The rationale for addressing this problem is that resolving it will (select all that apply):  Reduce symptoms of disorder, Reduce functional impairment associated with disorder, Decrease likelihood of hospitalization, Facilitate transfer skills learned in therapy to everday life, Is a key motivational factor for the patient's participation in treatment, Reduce risk for suicide* and Reduce risk for violence*    Progress toward goal(s): N/A - Initial plan    2 a. Measurable Objectives : Patient will utilize skills to identify triggers to maladaptive behaviors   Date established: 05/31/14   Target date: 08/29/14   Attained or Revised? new    2  b. Measurable Objectives : Patient will work to replace maladaptive behaviors with effective behaviors.   Date established: 05/31/14   Target date: 08/29/14   Attained or Revised? new    ______________________________________________________________________      Plan  TREATMENT MODALITIES:  Individual psychotherapy for 30-60 min Q 1-3 weeks with Tresa Res, LCSW.  DBT group psychotherapy for 90 min Q 1 weeks with Lelan Pons, LCSW-R.  Psychopharmacology visits 20-45 min Q 2-8 weeks with [name], NP.     DISCHARGE CRITERIA for this treatment setting: Patient  reports a reduction in symptoms as evidenced by PHQ-9 and GAD-7 scores.    Clinician's name: Tresa Res, LCSW    Psychiatrist's Name: Kerri Perches, MD      Patient/Family Statement  PATIENT/FAMILY STATEMENT:  Obtain patient and family input into the treatment plan, including areas of agreement / disagreement.  Obtain patient's signature - if not possible, briefly describe the reason.     Patient Comments:          I HAVE PARTICIPATED IN THE DEVELOPMENT OF THIS TREATMENT PLAN AND I AGREE WITH ITS CONTENTS:       Patient Signature: _______________________________________________________    Date: ______________________________

## 2014-06-01 ENCOUNTER — Telehealth: Payer: Self-pay

## 2014-06-01 NOTE — Progress Notes (Signed)
[facility] Treatment Team Consultation     Treatment Team [place]  Kerri Perches, MD, [name], NP, Lelan Pons, LCSW-R, Dion Saucier, LMSW, Earlie Lou, LMSW, Tresa Res, LCSW (writer)    Relevant Background  Patient has not been seen by her primary therapist, Tresa Res Regulatory affairs officer) since 03/18/14 and recently had another suicide attempt on 05/02/14. Patient was engaged in [facility] and had follow up appointments with [name], NP on 05/23/14 and with writer on 05/25/14. Patient cancelled her 05/25/14 appointment and rescheduled with writer for 05/31/14. Patient no-showed for her 05/31/14 appointment. Writer and team discussed patient's recent increase in service and lack of engagement except for medications. Team determination is that Probation officer will send patient a letter about the need to engage in therapy and request that patient meet with writer prior to her medication appointment on 06/06/14 with [name], NP. Ms. Foster Simpson will also inform patient of the need to engage in treatment or she could be discharged from clinic for non-compliance.    Plan  Responsible person: Tresa Res and [name] Corigliano

## 2014-06-01 NOTE — Telephone Encounter (Signed)
Call to pt for [facility] ED F/U, no answer, left name and number for call back

## 2014-06-02 NOTE — Telephone Encounter (Signed)
2nd call placed to pt, no answer, left name and number for call back

## 2014-06-03 NOTE — Telephone Encounter (Signed)
3rd call placed to pt for ED f/u, no answer, left name and number for call back

## 2014-06-06 ENCOUNTER — Ambulatory Visit: Payer: Self-pay | Admitting: Psychiatry

## 2014-06-06 ENCOUNTER — Encounter: Payer: Self-pay | Admitting: Psychiatry

## 2014-06-06 VITALS — BP 142/91 | HR 91 | Ht 68.11 in | Wt 293.0 lb

## 2014-06-06 DIAGNOSIS — F603 Borderline personality disorder: Secondary | ICD-10-CM

## 2014-06-06 DIAGNOSIS — F332 Major depressive disorder, recurrent severe without psychotic features: Secondary | ICD-10-CM

## 2014-06-06 DIAGNOSIS — F429 Obsessive-compulsive disorder, unspecified: Secondary | ICD-10-CM

## 2014-06-06 DIAGNOSIS — F4310 Post-traumatic stress disorder, unspecified: Secondary | ICD-10-CM

## 2014-06-06 MED ORDER — BENZTROPINE MESYLATE 1 MG PO TABS *I*
1.0000 mg | ORAL_TABLET | Freq: Two times a day (BID) | ORAL | Status: DC
Start: 2014-06-06 — End: 2014-07-01

## 2014-06-06 NOTE — Progress Notes (Addendum)
Behavioral Health Psychopharmacology Follow-up     Length of Session: 25 minutes.    Diagnosis Addressed    ICD-10-CM ICD-9-CM   1. PTSD (post-traumatic stress disorder) F43.10 309.81   2. 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 OCD (obsessive compulsive disorder) F42 300.3   4. Borderline personality disorder F60.3 301.83       Recent History and Response to Medications  Patient states:   HPI     Follow-up    Additional comments: patient here for medication evaluation             Current use of alcohol or drugs: No        Neurovegetative Symptoms Review:  Energy level: fair  Concentration: fair  Sleep Quality: good   Number of hours : 8  Appetite: fair (still craving junk food)     Wt Readings from Last 3 Encounters:   06/06/14 132.904 kg (293 lb)   05/23/14 133.358 kg (294 lb)   05/18/14 136.079 kg (300 lb)     Enjoyment/interest: poor        Current Medications  Current Outpatient Prescriptions   Medication Sig    mirtazapine (REMERON) 7.5 MG tablet     zolpidem (AMBIEN CR) 6.25 MG CR tablet Take 1 tablet (6.25 mg total) by mouth nightly as needed for Sleep   Max daily dose: 6.25 mg Swallow whole. Do not crush, break, or chew.    omeprazole (PRILOSEC) 20 MG capsule Take 1 capsule (20 mg total) by mouth daily (before breakfast)    insulin pen needle (NOVOFINE) 30G X 8 MM Use 1 times a day as instructed.    insulin glargine (LANTUS SOLOSTAR) 100 UNIT/ML injection pen Inject 22 Units into the skin nightly    clonazePAM (KLONOPIN) 0.5 MG tablet Take 1 tablet (0.5 mg total) by mouth daily as needed for Anxiety   Max daily dose: 0.5 mg    DULoxetine (CYMBALTA) 60 MG capsule Take 1 capsule (60 mg total) by mouth daily    busPIRone (BUSPAR) 15 MG tablet Take 1 tablet (15 mg total) by mouth 3 times daily    lurasidone (LATUDA) 80 MG tablet Take 1 tablet (80 mg total) by mouth daily    lancets Brand Free Style Lite; Use 2 times per day as directed for blood glucose  testing.    FREESTYLE LITE test strip Use BID as directed for 250.02    benztropine (COGENTIN) 1 MG tablet Take 1 tablet (1 mg total) by mouth nightly    Alcohol Swabs (ALCOHOL WIPES) PADS Use BID for BG check    levonorgestrel (MIRENA) 20 MCG/24HR IUD 1 each by Intrauterine route once    gabapentin (NEURONTIN) 800 MG tablet TAKE 1 TABLET (800 MG TOTAL) BY MOUTH 3 TIMES DAILY (Patient taking differently: Take 1 tablet (800 mg) twice DAILY  BID))    naproxen sodium (ANAPROX) 550 MG tablet TAKE 1 TABLET (550 MG TOTAL) BY MOUTH 2 TIMES DAILY (WITH MEALS)    Melatonin 5 MG CAPS Take 10 mg by mouth nightly    fluticasone (FLONASE) 50 MCG/ACT nasal spray 1 spray by Nasal route daily    topiramate (TOPAMAX) 50 MG tablet Take 1 tablet (50 mg total) by mouth 2 times daily    blood glucose monitor system Brand: cheapest brand available per her insurance.  Use as directed.    VENTOLIN HFA 108 (90 BASE) MCG/ACT inhaler Inhale 1-2 puffs into the lungs every 4-6 hours  as needed for Wheezing    SUMAtriptan (IMITREX) 50 MG tablet Take 1 tablet (50 mg total) by mouth as needed for Migraine   Take at onset of headache. May repeat once in 2 hours.    cyclobenzaprine (FLEXERIL) 5 MG tablet TAKE 1 TABLET (5 MG TOTAL) BY MOUTH 3 TIMES DAILY AS NEEDED FOR MUSCLE SPASMS    dicyclomine (BENTYL) 20 MG tablet     famciclovir (FAMVIR) 500 MG tablet Take 1 tablet (500 mg total) by mouth 3 times daily as needed    Non-System Medication The above patient is followed in our clinic and cannot resume work permanently.    ondansetron (ZOFRAN) 8 MG tablet Take 1 tablet (8 mg total) by mouth 3 times daily as needed for Nausea     No current facility-administered medications for this visit.       Side Effects  Patient Reported Side Effects: None reported    Mental Status  APPEARANCE: Casual  ATTITUDE TOWARD INTERVIEWER: Cooperative  MOTOR ACTIVITY: WNL (within normal limits)  EYE CONTACT: Direct  SPEECH: Normal rate and tone  AFFECT:  Decreased Range and Appropriate  MOOD: Angry, Anxious and Depressed  THOUGHT PROCESS: Normal  THOUGHT CONTENT: No unusual themes  PERCEPTION: Within normal limits  ORIENTATION: Alert and Oriented X 3.  CONCENTRATION: Fair  MEMORY:   Recent: intact   Remote: intact  COGNITIVE FUNCTION: Average intelligence  JUDGMENT: Intact  IMPULSE CONTROL: Fair  INSIGHT: Fair    Risk Assessment    Self Injury: Patient Denies  Suicidal Ideation: Yes. Describe: but says she cannot get hold of the pills because her wife has them.  Says she would follow her safety plan if things got worse.  Homicidal Ideation: Patient Denies  Aggressive Behavior: Patient Denies    Results  none    BP Readings from Last 3 Encounters:   06/06/14 142/91   05/23/14 148/87   05/18/14 130/88       Assessment  FORMULATION: Patient is feeling angry and disappointed after learning that her family went out to dinner for celebrate her sister in law's birthday but did not invite her.  She found out in an incidental conversation with her mother this morning.  She did not confront her mother.  In addition, she set limits to not see her father in law but now her mother in law also will not see her without him. So she is angry and frustrated by that.  She has had three episodes of dizziness in the last two weeks and fell during one hitting her forehead and was in the ED overnight.  She reports that the mirtazapine has helped her sleep.  The increased Latuda is "starting to kick in" but also has a little more akathisia.  We discussed increasing the cogentin to 1 mg bid to which she agreed.  She says this is the only medication change that she believes she needs today.  We discussed why she missed her appointment with her therapist and she has rescheduled it.     Recommendations/Plan and Rationale  PLAN:  Increase cogentin to 1 mg bid.  Continue other medications. Return in 2 weeks.

## 2014-06-07 ENCOUNTER — Telehealth: Payer: Self-pay

## 2014-06-07 NOTE — Telephone Encounter (Signed)
Discussed with pt; I have been aware of her more frequent ED visits for recurrent neurocardiogenic syncope. Loop monitor has been consistently negative for arrythmia. Cardiologist is weaning her meds to r/o polypharmacy, and pt feels like it's getting worse in frequency.    Calling c/o persistent unilateral head pain. She has consistently hit this area with the last 3 falls. Was seen again at [facility] ED last night, had repeat CT head and neck (all per pt, report not available to me). Was given narcotics there but not d/c-ed with script. Told to use ibuprofen and apap. Calling because she has persistent pain, not worsened since yesterday, no vomiting or vision changes. Asking for higher pain reliever.      Reviewed with pt I am not comfortable prescribing narcotic without evaluation in the office. GIven no interval increase in pain sx or red flags of vomiting or vision changes, I do not feel she need a visit here today or urgent care/ED tonight. SHe is attending [employer] program at this time, and therefore can only f/u in evening / late afternoon. Seeing Lala Lund DNP tomorrow.     I suspect poor coping skills and underlying psychiatric component to her recurrent syncopal episodes. Advised that she and i meet monthly at this time due to her frequent ED use. I hope this helps with some of her symptoms and occurrences.    Tomorrow will defer evaluation to [facility], if no apparent bruising or lacerations or swelling, would not offer narcotics. If she does, would do a short 2 day course and have her wife [name] dispense them. Staff, please schedule monthly visits with me (first availability please) re: syncope and DM2 x6 months when she leaves our office tomorrow.      [name], MD  [facility]  06/07/2014  5:04 PM

## 2014-06-07 NOTE — Telephone Encounter (Signed)
Pt states she was at ED one day ago and still having HA pain 10/10.  Per Dr. Laurance Flatten call transferred to her office to speak with pt.

## 2014-06-07 NOTE — Telephone Encounter (Signed)
Pt called to schedule an appointment which pt has with [facility] for 06/08/13 at 5:20pm but would like to know if someone can call pt in medication because she pt is in a lot of pain form her recent hospitalization. Please call pt back and advise her what the provider would like to do pt's number listed above was verified as the best number to reach her.

## 2014-06-07 NOTE — Telephone Encounter (Signed)
Call to pt for [facility] ED F/U, she states that she will call back to schedule as she is not at home and would like to call when she gets home.

## 2014-06-08 ENCOUNTER — Encounter: Payer: Self-pay | Admitting: Neurology

## 2014-06-08 ENCOUNTER — Ambulatory Visit: Payer: Self-pay | Admitting: Family Medicine

## 2014-06-08 ENCOUNTER — Telehealth: Payer: Self-pay | Admitting: Oncology

## 2014-06-08 ENCOUNTER — Other Ambulatory Visit: Payer: Self-pay | Admitting: Gastroenterology

## 2014-06-08 ENCOUNTER — Emergency Department
Admission: EM | Admit: 2014-06-08 | Disposition: A | Discharge status: Home / Self Care | Payer: Self-pay | Source: Ambulatory Visit | Attending: Emergency Medicine | Admitting: Emergency Medicine

## 2014-06-08 ENCOUNTER — Telehealth: Payer: Self-pay

## 2014-06-08 LAB — CBC AND DIFFERENTIAL
Baso # K/uL: 0.1 10*3/uL (ref 0.0–0.1)
Basophil %: 0.8 %
Eos # K/uL: 0.3 10*3/uL (ref 0.0–0.4)
Eosinophil %: 2.6 %
Hematocrit: 38 % (ref 34–45)
Hemoglobin: 12.1 g/dL (ref 11.2–15.7)
IMM Granulocytes #: 0.1 10*3/uL (ref 0.0–0.1)
IMM Granulocytes: 0.8 %
Lymph # K/uL: 2.2 10*3/uL (ref 1.2–3.7)
Lymphocyte %: 21.4 %
MCH: 27 pg/cell (ref 26–32)
MCHC: 32 g/dL (ref 32–36)
MCV: 86 fL (ref 79–95)
Mono # K/uL: 0.6 10*3/uL (ref 0.2–0.9)
Monocyte %: 5.8 %
Neut # K/uL: 7.1 10*3/uL — ABNORMAL HIGH (ref 1.6–6.1)
Nucl RBC # K/uL: 0 10*3/uL (ref 0.0–0.0)
Nucl RBC %: 0 /100 WBC (ref 0.0–0.2)
Platelets: 331 10*3/uL (ref 160–370)
RBC: 4.4 MIL/uL (ref 3.9–5.2)
RDW: 12.9 % (ref 11.7–14.4)
Seg Neut %: 68.6 %
WBC: 10.4 10*3/uL — ABNORMAL HIGH (ref 4.0–10.0)

## 2014-06-08 LAB — POCT GLUCOSE: Glucose POCT: 117 mg/dL — ABNORMAL HIGH (ref 60–99)

## 2014-06-08 LAB — COMPREHENSIVE METABOLIC PANEL
ALT: 13 U/L (ref 0–35)
AST: 13 U/L (ref 0–35)
Albumin: 4.1 g/dL (ref 3.5–5.2)
Alk Phos: 86 U/L (ref 35–105)
Anion Gap: 15 (ref 7–16)
Bilirubin,Total: <0.2 mg/dL (ref 0.0–1.2)
CO2: 22 mmol/L (ref 20–28)
Calcium: 9 mg/dL (ref 8.8–10.2)
Chloride: 102 mmol/L (ref 96–108)
Creatinine: 0.81 mg/dL (ref 0.51–0.95)
GFR,Black: 105 *
GFR,Caucasian: 91 *
Glucose: 192 mg/dL — ABNORMAL HIGH (ref 60–99)
Lab: 15 mg/dL (ref 6–20)
Potassium: 4.4 mmol/L (ref 3.3–5.1)
Sodium: 139 mmol/L (ref 133–145)
Total Protein: 6.6 g/dL (ref 6.3–7.7)

## 2014-06-08 LAB — HOLD BLUE

## 2014-06-08 LAB — HM HIV SCREENING OFFERED

## 2014-06-08 LAB — HOLD GREEN WITH GEL

## 2014-06-08 MED ORDER — SODIUM CHLORIDE 0.9 % IV SOLN WRAPPED *I*
100.0000 mL/h | Status: DC
Start: 2014-06-08 — End: 2014-06-09

## 2014-06-08 MED ORDER — ACETAMINOPHEN 325 MG PO TABS *I*
650.0000 mg | ORAL_TABLET | Freq: Once | ORAL | Status: AC
Start: 2014-06-08 — End: 2014-06-08
  Administered 2014-06-08: 650 mg via ORAL
  Filled 2014-06-08: qty 2

## 2014-06-08 MED ORDER — OXYCODONE-ACETAMINOPHEN 5-325 MG PO TABS *I*
2.0000 | ORAL_TABLET | Freq: Once | ORAL | Status: AC
Start: 2014-06-08 — End: 2014-06-08
  Administered 2014-06-08: 2 via ORAL
  Filled 2014-06-08: qty 2

## 2014-06-08 MED ORDER — KETOROLAC TROMETHAMINE 30 MG/ML IJ SOLN *I*
15.0000 mg | Freq: Once | INTRAMUSCULAR | Status: AC
Start: 2014-06-08 — End: 2014-06-08
  Administered 2014-06-08: 15 mg via INTRAVENOUS
  Filled 2014-06-08: qty 1

## 2014-06-08 MED ORDER — KETOROLAC TROMETHAMINE 30 MG/ML IJ SOLN *I*
7.5000 mg | Freq: Once | INTRAMUSCULAR | Status: DC
Start: 2014-06-08 — End: 2014-06-08

## 2014-06-08 NOTE — Telephone Encounter (Signed)
I have gone ahead and pre made some appointments so that way she is on the schedule and the appointments dont get taken

## 2014-06-08 NOTE — Telephone Encounter (Signed)
Pt currently in ED for a syncopal episode, so [facility].    [facility] was [facility] to [facility] to [facility], however contact info was still for this office.  Pharmacy to reroute refill.

## 2014-06-08 NOTE — Discharge Instructions (Signed)
You were seen in the Emergency Department for recurrent syncope episode. CT head and the cervical spine did not reveal any abnormalities. Blood work did not reveal acute abnormalities. Your loop recorder was interrogated and did not reveal any events. You were instructed to follow up with your PCP, Neurologist and Cardiologist after discharge.

## 2014-06-08 NOTE — ED Notes (Signed)
Provider at bedside

## 2014-06-08 NOTE — Telephone Encounter (Signed)
Kaylee Harris will contact patient once discharged.

## 2014-06-08 NOTE — ED Provider Progress Notes (Signed)
ED Provider Progress Note    [employer] representative called for Loop recorder interrogation.   Device interrogation reveals no episodes of tachycardia, bradycardia, or sinus pauses.   Patient activated the device on 06/05/14 which revealed no abnormalities at the time.     Prelim CT head: no acute abnormality.   Prelim CT cervical spine: no acute fx or subluxations.    Discussed with patient and spouse, who is at bedside. Recurrent syncope has been ongoing with frequent ED visits for this. At this time, no concern for tachy / brady arrhythmia as the cause of her ongoing symptoms. Recommend follow up with her PCP, Neurologist and Cardiologist for continued outpatient workup. Patient and spouse agreeable to D/C.      Hilma Favors, MD, 06/08/2014, 5:22 PM

## 2014-06-08 NOTE — Telephone Encounter (Signed)
Per snapshot, pt being seen [facility].  Please contact pt and let her know she should follow-up with their clinic for her Topamax Rx.

## 2014-06-08 NOTE — Telephone Encounter (Signed)
Needs appt last seen in 05/2013

## 2014-06-08 NOTE — Telephone Encounter (Signed)
Her future appointments has already been made

## 2014-06-08 NOTE — ED Provider Progress Notes (Signed)
ED Provider Progress Note    CT head and cspine negative.   C-collar removed.       Guss Bunde, MD, 06/08/2014, 5:00 PM

## 2014-06-08 NOTE — Telephone Encounter (Signed)
Please call to reschedule with me once released and make monthly  appt with me thereafter x22months.    [name], MD  [facility]  06/08/2014  1:23 PM

## 2014-06-08 NOTE — Telephone Encounter (Signed)
Family member called to cancel patients appointment this evening with [facility].  While at exercise class she fell and hit her head.  She is currently at [facility] ED being evaluated.  Her appointment today was a follow up appointment for syncope that she was seen and discharged from [facility] recently for.

## 2014-06-08 NOTE — Telephone Encounter (Signed)
Pt called back is scheduled 2/3 with NP

## 2014-06-08 NOTE — ED Notes (Signed)
At work and had a syncopal episode/ hx of neurocardiogenic syncopy with angina

## 2014-06-08 NOTE — ED Provider Notes (Addendum)
History     Chief Complaint   Patient presents with    Syncope       HPI Comments: 39 year old with PMHx of DM, depression, anxiety, neurocardiogenic syncope presenting with syncope.  She was at a center this morning where they help her deal with her anxiety and she remembers passing out after pushing the button for the elevator.  She fell flat on her face and hit her head.  She lost consciousness for 2-3 minutes.  She reports having a similar episode on Sunday where she passed out when she was at [employer].  She went to [facility] where they ordered a CT of her head and told her to follow up with her doctor.  Her cardiologist has been titrating her off some of her medications in hopes that her neurocardiogenic syncope improves but she believes it has worsened.  She currently denies feeling anxious.  She reports pain in her neck and back.      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 Lower abdomen with cellulitis changes, wound probed and purulent material expressed.  Had PICC line for "multiple infiltrations" of what? D/c-ed home on 10d of Augmentin 9/11.      Depression     Anxiety     Neuromuscular disorder      Past Surgical History   Procedure Laterality Date    Dilation and curettage of uterus  2005, 2007     x2 for menorraghia    Tonsillectomy      Cardiac catheterization  09/2011     negative    Loop recorder  Feb 03 2014    Arthroscopic shoulder surgery Right 2010     Related to lifting    Left colectomy  01/03/14     Dr Tresa Res     Small intestine surgery         Family History   Problem Relation Age of Onset    Hypertension Father     Diabetes Father     Kidney disease Father     Elevated lipids Father     Heart attack Father 58    Hypertension Mother     Elevated lipids Mother     Heart attack Mother 26    Diabetes Mother     Eczema Mother     Psoriasis Mother     Hypertension Brother     Heart disease Brother 21     prinzmetal's angina    Heart disease Sister      currently having work up    [place] Sister     Hypertension Sister     Breast cancer Maternal Grandmother     Stroke           Social History      reports that she has quit smoking. Her smoking use included Cigarettes.  She has a 1.5 pack-year smoking history. She has never used smokeless tobacco. She reports that she drinks alcohol. She reports that she currently engages in sexual activity and has had female partners. She reports that she does not use illicit drugs.    Living Situation     Questions Responses    Patient lives with Significant Other    Comment: Lives with Wife, [name]     Homeless No    Caregiver for other family member No    External Services Mental Health Services    Comment: [place]     Employment Disabled    Domestic Violence Risk No          Problem List     Patient Active Problem List   Diagnosis Code    DM (diabetes mellitus), type 2, uncontrolled E11.65    Migraine with aura G43.109    Asthma J45.909    Hyperlipidemia E78.5    Fibromyalgia M79.7    Neurocardiogenic syncope R55    Major depression, recurrent F33.9    IUD (intrauterine device) in place Z97.5    Skin lesion of face L98.9    IBS (irritable bowel syndrome) K58.9    Anxiety F41.9    OCD (obsessive compulsive disorder) F42    Carpal tunnel syndrome G56.00    Meralgia paresthetica of left side G57.12    No diabetic retinopathy in both eyes Z03.89       Review of Systems   Review of Systems   Constitutional: Negative.    Eyes: Negative for visual  disturbance.   Musculoskeletal: Positive for back pain and neck pain. Negative for neck stiffness.   Neurological: Positive for syncope and headaches. Negative for weakness and light-headedness.   Hematological: Does not bruise/bleed easily.   Psychiatric/Behavioral: The patient is not nervous/anxious.        Physical Exam     ED Triage Vitals   BP Heart Rate Heart Rate (via Pulse Ox) Resp Temp Temp src SpO2 O2 Device O2 Flow Rate   06/08/14 1251 06/08/14 1251 -- 06/08/14 1251 06/08/14 1251 06/08/14 1251 06/08/14 1251 06/08/14 1251 --   114/78 mmHg 98  20 36.8 C (98.2 F) Tympanic 98 % None (Room air)       Weight           06/08/14 1251           133.811 kg (295 lb)               Physical Exam   Constitutional: She appears well-developed and well-nourished.   HENT:   Head: Normocephalic.   Eyes: Pupils are equal, round, and reactive to light.   Cardiovascular: Normal rate and regular rhythm.    Pulmonary/Chest: Effort normal and breath sounds normal. No respiratory distress.   Abdominal: Soft. Bowel sounds are normal.   Neurological: She is alert. No cranial nerve deficit.       Medical Decision Making      Amount and/or Complexity of Data Reviewed  Clinical lab tests: ordered  Tests in the medicine section of CPT: ordered        Initial Evaluation:  ED First Provider Contact     Date/Time Event User Comments    06/08/14 1326 ED Provider First Contact Tildon Husky Initial Face to Face Provider Contact        Assessment:  39 y.o., female comes to the ED with syncope.    Differential Diagnosis includes anxiety, neurocardiogenic syncope, orthostatic hypotension.  Plan:   - Toradol  - IV fluids          CT head without contrast    CT cervical spine without contrast    Comprehensive metabolic panel    CBC and differential    Urinalysis with reflex to Microscopic UA and reflex to Bacterial Culture    Pregnancy, urine    HM HIV SCREENING Zola Button, MD  Resident  06/08/14  1431    Tildon Husky, MD  Resident  06/08/14 [phone]          Resident Attestation:     Patient seen by me on arrival date of 06/08/2014 at 1558.    History:   I reviewed this patient, reviewed the resident's note and agree.    Exam:   I examined this patient, reviewed the resident's note and agree.    Decision Making:   I discussed with the resident his/her documented decision making  and agree.      Author Tanna Savoy, MD  06/09/14 [phone]

## 2014-06-08 NOTE — ED Notes (Signed)
Discussed discharge papers with patient.  Patient verbalized understanding. IV removed. Patient being driven home by girlfriend.

## 2014-06-09 ENCOUNTER — Ambulatory Visit: Payer: Self-pay | Admitting: Primary Care

## 2014-06-09 ENCOUNTER — Encounter: Payer: Self-pay | Admitting: Primary Care

## 2014-06-09 ENCOUNTER — Telehealth: Payer: Self-pay

## 2014-06-09 VITALS — BP 142/100 | HR 96 | Temp 98.2°F | Ht 68.0 in | Wt 294.4 lb

## 2014-06-09 DIAGNOSIS — M797 Fibromyalgia: Secondary | ICD-10-CM

## 2014-06-09 DIAGNOSIS — F339 Major depressive disorder, recurrent, unspecified: Secondary | ICD-10-CM

## 2014-06-09 DIAGNOSIS — R55 Syncope and collapse: Secondary | ICD-10-CM

## 2014-06-09 LAB — PCMH FALL RISK PLAN

## 2014-06-09 LAB — PCMH FALL RISK ASSESSMENT

## 2014-06-09 MED ORDER — GABAPENTIN 800 MG PO TABLET *I*
800.0000 mg | ORAL_TABLET | Freq: Two times a day (BID) | ORAL | Status: DC
Start: 2014-06-09 — End: 2014-06-28

## 2014-06-09 MED ORDER — INSULIN GLARGINE 100 UNIT/ML SC SOPN *I*
26.0000 [IU] | PEN_INJECTOR | Freq: Every evening | SUBCUTANEOUS | Status: DC
Start: 2014-06-09 — End: 2014-06-28

## 2014-06-09 MED ORDER — OXYCODONE-ACETAMINOPHEN 5-325 MG PO TABS *I*
1.0000 | ORAL_TABLET | Freq: Every day | ORAL | Status: DC | PRN
Start: 2014-06-09 — End: 2014-07-14

## 2014-06-09 NOTE — Telephone Encounter (Signed)
Call to pt for [facility] ED F/U, she states that she had just gotten off the phone with a secretary here and scheduled a same day appt. She is seeing her PCP
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
## 2014-06-13 ENCOUNTER — Encounter: Payer: Self-pay | Admitting: Psychiatry

## 2014-06-14 ENCOUNTER — Other Ambulatory Visit: Payer: Self-pay | Admitting: Psychiatry

## 2014-06-14 LAB — EKG 12-LEAD
P: 25 degrees
QRS: 31 degrees
Rate: 98 {beats}/min
Severity: ABNORMAL
Severity: ABNORMAL
T: 23 degrees

## 2014-06-15 ENCOUNTER — Ambulatory Visit
Admit: 2014-06-15 | Discharge: 2014-06-15 | Disposition: A | Discharge status: Home / Self Care | Payer: Self-pay | Source: Ambulatory Visit | Admitting: Nutrition

## 2014-06-15 ENCOUNTER — Other Ambulatory Visit: Payer: Self-pay | Admitting: Psychiatry

## 2014-06-15 ENCOUNTER — Other Ambulatory Visit: Payer: Self-pay | Admitting: Registered Nurse

## 2014-06-15 ENCOUNTER — Ambulatory Visit: Payer: Self-pay | Admitting: Registered Nurse

## 2014-06-15 DIAGNOSIS — E119 Type 2 diabetes mellitus without complications: Secondary | ICD-10-CM

## 2014-06-15 MED ORDER — LURASIDONE HCL 80 MG PO TABS *I*
80.0000 mg | ORAL_TABLET | Freq: Every day | ORAL | Status: DC
Start: 2014-06-15 — End: 2014-06-20

## 2014-06-15 MED ORDER — BUSPIRONE HCL 15 MG PO TABS *I*
15.0000 mg | ORAL_TABLET | Freq: Three times a day (TID) | ORAL | Status: DC
Start: 2014-06-15 — End: 2014-06-20

## 2014-06-15 NOTE — Progress Notes (Signed)
Kaylee Harris 39 y.o. female was seen today for diabetes education regarding  uncontrolled type 2 diabetes mellitus. 1st visit for DSMT; accompanied by her wife.    Diabetes Meds: reviewed in eRecord; patient is taking as prescribed. Lantus pen 26 units @ hs as of 01/28/15    Lab Results   Component Value Date    HA1C 7.8* 05/11/2014    HA1C 6.7* 01/26/2014    HA1C 7.0* 10/28/2013    MALBR <0.30 07/29/2013    CREAT 0.81 06/08/2014    LDLC see below 05/11/2014        Self glucose monitoring:  FreeStyle Lite BG meter before breakfast=today 123-114-150 (infreqt).  BG today: 169 mg/dl 2 1/2 hr PP proper meal    Diet: 3 meals per day, inadequate fruits and vegetables and Gatorade; admits to Hs snacks of "bad food"  Current Activity : sedentary and using walker   Smoking: no Alcohol: no did not inquire     Self foot check: yes   Eye Exam inquiry: yes done    Assessment:   Partner supportive & neither cook ; willing to explore food options  Consider myfitnesspal to help track food intake & assist wt loss  Needs to add air shot to insulin pen prep & willing to do so  FBS in goal with lantus 26 units  Willing to bracket different meals & goal BG rise 40-50pts; will bring food log to RD visit 06/29/14  Does own foot care/ walks barefoot at home "all the time"   Coun/Edu:     BARRIERS THAT AFFECT LEARNING:  Mental health  READINESS TO LEARN:  good  PREFERRED METHODS OF LEARNING:  reading, demonstration and visual tools  EDUCATION PROVIDED: carb factors, BG Goals, basal rate testing and safety with pen use; risk reduction; exercise options with her walker    Plan:     Patient Instructions   1.  Add 1 unit waste (air shot) then dial up dose 26 units   If using arm, try fatty part  2.  Record 3 days food & try for bG check before & 1 to 2 hrs AFTER same meal (different meal each day)   Goal is 40-50 pt rise  3.  G2 instead of Gatorade  4.   4 carb choices/meal; 1 for snack  5.   myfitnesspal      DSMT SUPPORT PLAN:  PCP:  CDE  Follow-up visit:  2/24/1 at [facility] with RD CDE  Time spent counseling patient: 60 minutes    DSMT  individual 1 hr;   Medicaid DSMT (10 hrs) initial. start 06/15/14 end 12/14/14. DSMT 9 hrs remaining  Medicaid renew order every 6 months     Demarrius Guerrero C Raidyn Breiner, RN, CDE

## 2014-06-15 NOTE — Patient Instructions (Signed)
1.  Add 1 unit waste (air shot) then dial up dose 26 units   If using arm, try fatty part  2.  Record 3 days food & try for bG check before & 1 to 2 hrs AFTER same meal (different meal each day)   Goal is 40-50 pt rise  3.  G2 instead of Gatorade  4.   4 carb choices/meal; 1 for snack  5.   myfitnesspal

## 2014-06-17 ENCOUNTER — Ambulatory Visit: Payer: Self-pay

## 2014-06-17 ENCOUNTER — Encounter: Payer: Self-pay | Admitting: Gastroenterology

## 2014-06-17 DIAGNOSIS — F339 Major depressive disorder, recurrent, unspecified: Secondary | ICD-10-CM

## 2014-06-17 NOTE — Progress Notes (Signed)
Behavioral Health Progress Note     LENGTH OF SESSION: 50 minutes    Contact Type:  Location: On Site    Face to Face     Problem(s)/Goals Addressed from Treatment Plan:    Problem 1:   R PSY TP PROBLEM 05/31/2014   1ST TREATMENT PLAN PROBLEM Depression and anxiety       Goal for this problem:    R PSY TP GOAL 05/31/2014   1ST TREATMENT PLAN GOAL Learn DBT and PST skills to manage affect       Progress towards this goal: N/A - New Goal    Mental Status Exam:  APPEARANCE: Appears stated age, Poor Hygiene  ATTITUDE TOWARD INTERVIEWER: Evasive and Guarded  MOTOR ACTIVITY: WNL (within normal limits)  EYE CONTACT: Avoidant  SPEECH: Normal rate and tone  AFFECT: Full Range  MOOD: Anxious and Depressed  THOUGHT PROCESS: Circumstantial and Concrete  THOUGHT CONTENT: Negative Rumination  PERCEPTION: No evidence of hallucinations  ORIENTATION: Alert and Oriented X 3.  CONCENTRATION: Fair  MEMORY:   Recent: intact   Remote: intact  COGNITIVE FUNCTION: Average intelligence  JUDGMENT: Intact  IMPULSE CONTROL: Fair  INSIGHT: Fair and Limited    Risk Assessment:  ASSESSMENT OF RISK FOR SUICIDAL BEHAVIOR  Changes in risk for suicide from baseline Formulation of Risk and/or previous intake, including newly identified risk, if any: none  Violence risk was assessed and No Change noted from baseline formulation of risk and/or previous assessment.    Session Content::  Writer had patient's wife come in to session. Writer talked to patient and her wife about patient's suicide attempt at the end of December. Writer talked to patient and her wife about patient's tendency to use behaviors that make her wife give her what she wants. Writer contracted with patient's wife to continue to keep patient's medications away from her and patient's wife assured Probation officer that patient would not have access to them whatsoever. Patient's wife assured writer that she is also taking this approach with regard to alcohol and sweets. Patient and Probation officer discussed  patient going to [facility]. Patient said that she was referred to [facility]. Writer talked to patient about asking her PCP for percoset and patient said that her wife did this because patient was in such pain and she did not see a problem with this. Patient told Probation officer that she was seen by a neurologist as she does not believe that her fainting is psychogenic. She said that this neurologist also prescribed her medication. Writer obtained a release for the neurologist. Writer talked to patient about continuing care here and patient said that [facility] wanted her to continue here. Writer told patient that she would need to have patient sign an Naval architect and patient said that she had been afraid to come because she thought Probation officer and her medication provider would "yell" at her. Writer and patient discussed patient's family's reaction and patient's sadness over her relationship with them. Patient said that she was also sad at losing her MIL because she did not want to see her FIL anymore. She said that there was a birthday party for her nephew that she won't be able to attend. Writer asked patient to consider seeing her nephew at another time to celebrate with him. Patient said that she might have her wife pick him up and bring him over.     Interventions:    Continued to develop therapeutic relationship, Developed attendance agreement, Treatment Planning    Plan:  Psychotherapy continues  as described in care plan; plan remains the same.    NEXT APPT: 06/24/14 @1       Tresa Res, LMSW

## 2014-06-19 ENCOUNTER — Encounter: Payer: Self-pay | Admitting: Gastroenterology

## 2014-06-20 ENCOUNTER — Other Ambulatory Visit: Payer: Self-pay | Admitting: Psychiatry

## 2014-06-20 ENCOUNTER — Encounter: Payer: Self-pay | Admitting: Psychiatry

## 2014-06-20 ENCOUNTER — Ambulatory Visit: Payer: Self-pay | Admitting: Psychiatry

## 2014-06-20 VITALS — BP 141/88 | HR 104 | Ht 68.11 in | Wt 296.0 lb

## 2014-06-20 DIAGNOSIS — F339 Major depressive disorder, recurrent, unspecified: Secondary | ICD-10-CM

## 2014-06-20 DIAGNOSIS — F4310 Post-traumatic stress disorder, unspecified: Secondary | ICD-10-CM

## 2014-06-20 DIAGNOSIS — F429 Obsessive-compulsive disorder, unspecified: Secondary | ICD-10-CM

## 2014-06-20 DIAGNOSIS — F449 Dissociative and conversion disorder, unspecified: Secondary | ICD-10-CM

## 2014-06-20 DIAGNOSIS — F603 Borderline personality disorder: Secondary | ICD-10-CM

## 2014-06-20 MED ORDER — LURASIDONE HCL 80 MG PO TABS *I*
80.0000 mg | ORAL_TABLET | Freq: Every day | ORAL | Status: DC
Start: 2014-06-20 — End: 2014-08-23

## 2014-06-20 MED ORDER — MIRTAZAPINE 7.5 MG PO TABS *A*
7.5000 mg | ORAL_TABLET | Freq: Every evening | ORAL | Status: DC
Start: 2014-06-20 — End: 2014-08-23

## 2014-06-20 MED ORDER — BUSPIRONE HCL 30 MG PO TABS *I*
30.0000 mg | ORAL_TABLET | Freq: Two times a day (BID) | ORAL | Status: DC
Start: 2014-06-20 — End: 2014-07-18

## 2014-06-20 NOTE — Progress Notes (Addendum)
Behavioral Health Psychopharmacology Follow-up     Length of Session: 20 minutes.    Diagnosis Addressed    ICD-10-CM ICD-9-CM   1. MDD (major depressive disorder), recurrent episode F33.9 296.30   2. OCD (obsessive compulsive disorder) F42 300.3   3. PTSD (post-traumatic stress disorder) F43.10 309.81   4. Conversion disorder F44.9 300.11   5. Borderline personality disorder F60.3 301.83       Recent History and Response to Medications  Patient states:     Current use of alcohol or drugs: No        Neurovegetative Symptoms Review:  Energy level: poor  Concentration: poor  Sleep Quality: good   Number of hours : 8  Appetite: fair     Wt Readings from Last 3 Encounters:   06/20/14 134.265 kg (296 lb)   06/09/14 133.539 kg (294 lb 6.4 oz)   06/08/14 133.811 kg (295 lb)     Enjoyment/interest: poor        Current Medications  Current Outpatient Prescriptions   Medication Sig    topiramate (TOPAMAX) 100 MG tablet     busPIRone (BUSPAR) 15 MG tablet Take 1 tablet (15 mg total) by mouth 3 times daily    lurasidone (LATUDA) 80 MG tablet Take 1 tablet (80 mg total) by mouth daily    gabapentin (GABAPENTIN) 800MG  tablet Take 1 tablet (800 mg total) by mouth 2 times daily    insulin glargine (LANTUS SOLOSTAR) 100 UNIT/ML injection pen Inject 26 Units into the skin nightly    mirtazapine (REMERON) 7.5 MG tablet     benztropine (COGENTIN) 1 MG tablet Take 1 tablet (1 mg total) by mouth 2 times daily    zolpidem (AMBIEN CR) 6.25 MG CR tablet Take 1 tablet (6.25 mg total) by mouth nightly as needed for Sleep   Max daily dose: 6.25 mg Swallow whole. Do not crush, break, or chew.    omeprazole (PRILOSEC) 20 MG capsule Take 1 capsule (20 mg total) by mouth daily (before breakfast)    insulin pen needle (NOVOFINE) 30G X 8 MM Use 1 times a day as instructed.    clonazePAM (KLONOPIN) 0.5 MG tablet Take 1 tablet (0.5 mg total) by mouth daily as needed for Anxiety   Max daily dose: 0.5 mg    DULoxetine (CYMBALTA) 60 MG  capsule Take 1 capsule (60 mg total) by mouth daily    lancets Brand Free Style Lite; Use 2 times per day as directed for blood glucose testing.    FREESTYLE LITE test strip Use BID as directed for 250.02    Alcohol Swabs (ALCOHOL WIPES) PADS Use BID for BG check    levonorgestrel (MIRENA) 20 MCG/24HR IUD 1 each by Intrauterine route once    naproxen sodium (ANAPROX) 550 MG tablet TAKE 1 TABLET (550 MG TOTAL) BY MOUTH 2 TIMES DAILY (WITH MEALS)    Melatonin 5 MG CAPS Take 10 mg by mouth nightly    fluticasone (FLONASE) 50 MCG/ACT nasal spray 1 spray by Nasal route daily    dicyclomine (BENTYL) 20 MG tablet     famciclovir (FAMVIR) 500 MG tablet Take 1 tablet (500 mg total) by mouth 3 times daily as needed    blood glucose monitor system Brand: cheapest brand available per her insurance.  Use as directed.    Non-System Medication The above patient is followed in our clinic and cannot resume work permanently.    ondansetron (ZOFRAN) 8 MG tablet Take 1 tablet (8 mg total) by  mouth 3 times daily as needed for Nausea    oxyCODONE-acetaminophen (PERCOCET) 5-325 MG per tablet Take 1 tablet by mouth daily as needed for Pain   Max daily dose: 1 tablet    SUMAtriptan Succinate (IMITREX) 6 MG/0.5ML SOLN injection Inject 6 mg into the skin ONCE PRN for Headaches    VENTOLIN HFA 108 (90 BASE) MCG/ACT inhaler Inhale 1-2 puffs into the lungs every 4-6 hours as needed for Wheezing    SUMAtriptan (IMITREX) 50 MG tablet Take 1 tablet (50 mg total) by mouth as needed for Migraine   Take at onset of headache. May repeat once in 2 hours.    cyclobenzaprine (FLEXERIL) 5 MG tablet TAKE 1 TABLET (5 MG TOTAL) BY MOUTH 3 TIMES DAILY AS NEEDED FOR MUSCLE SPASMS     No current facility-administered medications for this visit.       Side Effects  Patient Reported Side Effects: None reported    Mental Status  APPEARANCE: Casual  ATTITUDE TOWARD INTERVIEWER: Cooperative  MOTOR ACTIVITY: WNL (within normal limits)  EYE CONTACT:  Direct  SPEECH: Normal rate and tone  AFFECT: Full Range and Appropriate  MOOD: Anxious and Depressed  THOUGHT PROCESS: Normal  THOUGHT CONTENT: No unusual themes  PERCEPTION: Within normal limits  ORIENTATION: Alert and Oriented X 3.  CONCENTRATION: Good  MEMORY:   Recent: intact   Remote: intact  COGNITIVE FUNCTION: Average intelligence  JUDGMENT: Intact  IMPULSE CONTROL: Fair  INSIGHT: Fair    Risk Assessment    Self Injury: Patient Denies  Suicidal Ideation: Yes. Describe: Has had it "pop in my head that I wished it worked" but only was a brief thought.    Homicidal Ideation: Patient Denies  Aggressive Behavior: Patient Denies    Results  none    BP Readings from Last 3 Encounters:   06/20/14 141/88   06/09/14 142/100   06/08/14 129/69       Assessment  FORMULATION: Patient reports the increase in Cogentin was helpful for the akathesia.  We discussed the issue she is having with her therapist.  She says sometimes she does not feel like she listens to her.  Writer encouraged her to talk with her about it and she said she did not want to hurt her feelings.  Writer questioned how often she does not speak up for herself because she is afraid of hurting someone's feelings.  She laughed and admitted it happens frequently.  We discussed it is an opportunity for her to work through those issues with her therapist which will help her in her other relationships to which she agreed.  We also discussed psychogenic symptoms during which writer her her wife join the session per the patients request.  Both said after that it was helpful and they had a better understanding.  We discussed the importance of targeting her anxiety symptoms both through medications and skills.  We discussed increasing her buspirone to which she agreed.      Recommendations/Plan and Rationale  PLAN:  Increase Buspar to 30 mg bid.  Continue other medications.  Return in 4 week.s

## 2014-06-27 ENCOUNTER — Telehealth: Payer: Self-pay

## 2014-06-27 NOTE — Telephone Encounter (Signed)
Patient called back and said that she was already scheduled to come in our office on 06/28/14. She is just going to keep that appointment.

## 2014-06-27 NOTE — Telephone Encounter (Signed)
Call placed to pt for [facility] f/u, no answer, left name and number for call back

## 2014-06-28 ENCOUNTER — Encounter: Payer: Self-pay | Admitting: Primary Care

## 2014-06-28 ENCOUNTER — Ambulatory Visit: Payer: Self-pay | Admitting: Primary Care

## 2014-06-28 ENCOUNTER — Telehealth: Payer: Self-pay

## 2014-06-28 VITALS — BP 124/86 | HR 84 | Temp 98.2°F | Ht 68.11 in | Wt 301.8 lb

## 2014-06-28 DIAGNOSIS — F332 Major depressive disorder, recurrent severe without psychotic features: Secondary | ICD-10-CM

## 2014-06-28 DIAGNOSIS — R55 Syncope and collapse: Secondary | ICD-10-CM

## 2014-06-28 DIAGNOSIS — IMO0002 Reserved for concepts with insufficient information to code with codable children: Secondary | ICD-10-CM

## 2014-06-28 MED ORDER — INSULIN GLARGINE 100 UNIT/ML SC SOPN *I*
28.0000 [IU] | PEN_INJECTOR | Freq: Every evening | SUBCUTANEOUS | Status: DC
Start: 2014-06-28 — End: 2014-09-19

## 2014-06-28 MED ORDER — GABAPENTIN 100 MG PO CAPSULE *I*
ORAL_CAPSULE | ORAL | Status: DC
Start: 2014-06-28 — End: 2014-08-25

## 2014-06-28 NOTE — Telephone Encounter (Signed)
Clinical Management Note   Writer received an email from Barbaraann Rondo at [facility] requesting a call back. Writer called Ms. Fisher and left a voice mail with writer's direct phone number.

## 2014-06-28 NOTE — Patient Instructions (Signed)
[phone] for [facility]

## 2014-06-29 ENCOUNTER — Ambulatory Visit
Admit: 2014-06-29 | Discharge: 2014-06-29 | Disposition: A | Discharge status: Home / Self Care | Payer: Self-pay | Source: Ambulatory Visit | Admitting: Nutrition

## 2014-06-29 ENCOUNTER — Telehealth: Payer: Self-pay | Admitting: Primary Care

## 2014-06-29 ENCOUNTER — Encounter: Payer: Self-pay | Admitting: Gastroenterology

## 2014-06-29 ENCOUNTER — Ambulatory Visit: Payer: Self-pay | Admitting: Nutrition

## 2014-06-29 DIAGNOSIS — Z9189 Other specified personal risk factors, not elsewhere classified: Secondary | ICD-10-CM | POA: Insufficient documentation

## 2014-06-29 DIAGNOSIS — F332 Major depressive disorder, recurrent severe without psychotic features: Secondary | ICD-10-CM

## 2014-06-29 DIAGNOSIS — F488 Other specified nonpsychotic mental disorders: Secondary | ICD-10-CM

## 2014-06-29 DIAGNOSIS — F603 Borderline personality disorder: Secondary | ICD-10-CM

## 2014-06-29 NOTE — Telephone Encounter (Signed)
Called and discussed case with [name], Psych NP. 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 She has also tried similar counseling re: journaling about emotions and behaviors that trigger the syncopal episodes. She also gives me insight that pt was never dismissed from [facility] program due to syncope, but rather due to frequent absences. Plan going forward will be to refer her back, but pt is responsible to maintain attendence. There is also [employer] work shops that are drop-in classes that she could do until she feels well enough to attend regularly.     We reviewed our combined concern about the IV narcotic seeking as documented in the ED reports, and I will update her problem list to reflect this (NO IV NARCOTICS). I also spoke with cardiologist Dr Kerin Ransom who agreed to do the same within the [facility] system. 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     Discussed with Dr Kerin Ransom the issues with the medical helmet; he states that he wrote the script and sent it to [facility] according to neuro recommendations (who advised him since they prescribe it more). He would appreciate our office Care manager f/u with [facility] to determine what the hold up is with her getting it.    I called and spoke with pt's wife [name] regarding these matters as well as f/u to yet another ED visit last night. She describes that [place] fainted while out at bingo with her parents. Khalis went to bingo immediately after our office visit, [name] states that she did not go home to get her bicycle helmet but went immediately there. ED note yet again reflects the concern about IV narcotic requests.    I discussed with [name] the fact that the above members and I all discussed her psychogenic component of the syncope. [name] voices she doesn't think  that psychiatric illness is the only thing going on, but "there's something else too". I voiced my concern is the danger and harm that comes about with trying to search and find this medical answer, and that we can attempt to get an MRI of the brain but what if this is negative, then what? She did not have an answer. We reviewed the fact that I am going to update her problem list to include no IV narcotics, and [name] voiced that she became "upset" and concerned about this issue after our office visit. She then was interrupted by Bethena Roys and ended our conversation. We will hopefully try to speak again on Friday.    [name], MD  [facility]  06/29/2014  4:18 PM

## 2014-06-29 NOTE — Progress Notes (Signed)
Kaylee Harris is here today for Medical Nutrition Therapy regarding Insulin requiring Type 2 Diabetes. Referred by: PCP  Here with wife  many questions about carbs and reading labels  Was in ED overnight for syncopal episode    Wt Readings from Last 3 Encounters:   06/28/14 136.896 kg (301 lb 12.8 oz)   06/20/14 134.265 kg (296 lb)   06/09/14 133.539 kg (294 lb 6.4 oz)     Lab Results   Component Value Date    HA1C 7.8* 05/11/2014    HA1C 6.7* 01/26/2014    HA1C 7.0* 10/28/2013    MALBR <0.30 07/29/2013    CREAT 0.81 06/08/2014    LDLC see below 05/11/2014       Diet: grazing on snacks not going to program anymore; 3 meals close together 11 am 1 pm and 5 pm  Beverages: Powerade Zero  ETOH: no  Exercise : sedentary  ; dizzy spells  DM Meds: Lantus 28 units pm  Taking as prescribed.       Meter:  Freestyle lite 1 time daily,   before breakfast=146.     Assessment:   BGs trending down   Meals are OK but eating too many snacks  Willing to try carb counting easy (point system)    Coun/Edu:     Barriers to learning:  Neuro problems  Education Provided: Plate Method label reading and carb factors    Plan:   Nutrition Goals:   1500 calories  150-180 gm carbs  Weight goal: (-10%) 270 lb      Follow-up visit:  3 month(s)  Time spent counseling patient: 55 minutes    [place], RD, CDE

## 2014-06-29 NOTE — Progress Notes (Addendum)
[facility]    SUBJECTIVE    Pt is here to discuss:    Chief Complaint   Patient presents with    Diabetes    Follow-up     [facility] on 2/19    Medication Reaction     torodol rash    Other     due for micro al     1. Syncope - Has  had 4 episodes since our last visit 20d ago. Has gone to UC or ED 3 times (2/14, 2/19, also 2/21 per pt but I do not have documents of this). Tells nurse today that she was given injection and had a local reaction at her last ED visit on 2/21 (thinks it was toradol). Is here today without a helmet on. Wife [name] tells me they picked up a [employer] bike helmet and they have her wear it at home until they get the medical helmet from Dr Ritter's office (still having issues between insurance and prescription, they roll their eyes and say it's a mess without exact details on the current progress of it). Tametra did not wear helmet here today because she doesn't want to look weird in public.     For her last syncope, describes that it happened in her bedroom, after standing up to try to go to BR. Was reaching for her helmet but didn't get it in time. Fell and "smacked her head" on the table, hurt her rib cage and back. Wife [name] was in basement. Pt says she couldn't wait for her to come back upstairs to use BR. [name] took her to hospital due to the c/o back pain.    UC review from 2/14 shows they recommended she go to ED. No ED report from that date.    Next presented to ED on 2/19 with c/o chest pain and blurred vision. Review of 2/19 ED note describes serial trops which were neg, EKG was normal. CT head was not done due to concern about risk>benefit given the frequent previous CTs in the recent past. Notes also document that pt was specifically requesting IV pain meds and that the provider offered PO instead. D/c summary describes IV Dilaudid was given however. When I brought this up with pt today she says they were only offering her APAP which she knew wouldn't work, therefore  asked for IV meds.    Today she describes persistent h/a, blurred vision and chest pain with radiation down her L arm. Tells me she went back to ED on 2/21 for evaluation of this chest pain as well as rib pain. Recalls xrays which were normal, but she still wonders if she broke some ribs with the last fall.     Also since our last visit she was "kicked out" of the [facility] program due to her recurrent syncope. She describes that she "feels discriminated against" as other pts there have their own medical issues and they are not "kicked out".  Tells me she was enjoying that program and is now worried her thoughts of suicide will return. She denies any active thoughts at this time.    Wife [name] asking about Gabapentin and if this can be reduced; we have gone from 800mg  TID to BID as of January, and yesterday [name] and Evany decided to go to 400mg  BID.    2. F/u DM - Fasting BGs in 140s-160s. Currently doing 26U qhs. Seen be [facility] and has appt coming up tomorrow.     PMH / Family  Hx / Social Hx  Patient's medications, allergies, problem list, past medical, social histories were reviewed and notable for:      Current Outpatient Prescriptions   Medication Sig Note    gabapentin (NEURONTIN) 800MG  capsule Take 1 tab (800mg ) BID     insulin glargine (LANTUS SOLOSTAR) 100 UNIT/ML injection pen Inject 26 Units into the skin nightly     mirtazapine (REMERON) 7.5 MG tablet Take 1 tablet (7.5 mg total) by mouth nightly     topiramate (TOPAMAX) 100 MG tablet  06/20/2014: Received from: External Pharmacy    busPIRone (BUSPAR) 30 MG tablet Take 1 tablet (30 mg total) by mouth 2 times daily     lurasidone (LATUDA) 80 MG tablet Take 1 tablet (80 mg total) by mouth daily     oxyCODONE-acetaminophen (PERCOCET) 5-325 MG per tablet Take 1 tablet by mouth daily as needed for Pain   Max daily dose: 1 tablet     SUMAtriptan Succinate (IMITREX) 6 MG/0.5ML SOLN injection Inject 6 mg into the skin ONCE PRN for Headaches     benztropine  (COGENTIN) 1 MG tablet Take 1 tablet (1 mg total) by mouth 2 times daily     zolpidem (AMBIEN CR) 6.25 MG CR tablet Take 1 tablet (6.25 mg total) by mouth nightly as needed for Sleep   Max daily dose: 6.25 mg Swallow whole. Do not crush, break, or chew.     omeprazole (PRILOSEC) 20 MG capsule Take 1 capsule (20 mg total) by mouth daily (before breakfast)     insulin pen needle (NOVOFINE) 30G X 8 MM Use 1 times a day as instructed.     clonazePAM (KLONOPIN) 0.5 MG tablet Take 1 tablet (0.5 mg total) by mouth daily as needed for Anxiety   Max daily dose: 0.5 mg     DULoxetine (CYMBALTA) 60 MG capsule Take 1 capsule (60 mg total) by mouth daily     lancets Brand Free Style Lite; Use 2 times per day as directed for blood glucose testing.     FREESTYLE LITE test strip Use BID as directed for 250.02     Alcohol Swabs (ALCOHOL WIPES) PADS Use BID for BG check     levonorgestrel (MIRENA) 20 MCG/24HR IUD 1 each by Intrauterine route once     VENTOLIN HFA 108 (90 BASE) MCG/ACT inhaler Inhale 1-2 puffs into the lungs every 4-6 hours as needed for Wheezing     naproxen sodium (ANAPROX) 550 MG tablet TAKE 1 TABLET (550 MG TOTAL) BY MOUTH 2 TIMES DAILY (WITH MEALS)     Melatonin 5 MG CAPS Take 10 mg by mouth nightly     SUMAtriptan (IMITREX) 50 MG tablet Take 1 tablet (50 mg total) by mouth as needed for Migraine   Take at onset of headache. May repeat once in 2 hours.     fluticasone (FLONASE) 50 MCG/ACT nasal spray 1 spray by Nasal route daily     cyclobenzaprine (FLEXERIL) 5 MG tablet TAKE 1 TABLET (5 MG TOTAL) BY MOUTH 3 TIMES DAILY AS NEEDED FOR MUSCLE SPASMS     dicyclomine (BENTYL) 20 MG tablet  08/18/2013: Received from: External Pharmacy    famciclovir (FAMVIR) 500 MG tablet Take 1 tablet (500 mg total) by mouth 3 times daily as needed     blood glucose monitor system Brand: cheapest brand available per her insurance.  Use as directed.     Non-System Medication The above patient is followed in our  clinic and cannot resume work  permanently.     ondansetron (ZOFRAN) 8 MG tablet Take 1 tablet (8 mg total) by mouth 3 times daily as needed for Nausea        OBJECTIVE  Filed Vitals:    06/28/14 1633   BP: 124/86   Pulse: 84   Temp: 36.8 C (98.2 F)   TempSrc: Temporal   Height: 1.73 m (5' 8.11")   Weight: 136.896 kg (301 lb 12.8 oz)     Body mass index is 45.74 kg/(m^2).      General:  Morbidly obese Caucasian female, pleasant & conversant, in NAD  Psych: AAOx3. Insight and judgement poor. Nurse who roomed Leslye relayed concern to me about blunted and slowed affect, poor eye contact. Upon me entering room, Cailynn was bright, +eye contact and then immediately changed into more solomn affect when I brought up this concern.   Eyes:. PERRLA. EOMI. Conjunctiva pink, without swelling or exudate. Lids normal. No nystagmus  HENT: Atraumatic, normocephalic       ASSESSMENT & PLAN  1. Neurocardiogenic syncope  2. Major depressive disorder, recurrent, severe without psychotic features  I remain highly suspicious of a psychogenic component to her illness, given her increased frequency of ED visits despite medication changes (now trialing a medication reduction per cardiologist), and her increasing psychiatric distress (as evidenced by 2 suicide attempts in the last year). She is aware of this suspicion. I also voiced my high level of concern regarding her request for IV narcotics per the most recent ED report. I bluntly stated that her constant suffering, frequent ED visits and frequent IV narcotic doses put her at risk of opiate abuse and dependence. I did NOT refill her percocet today and basically stated she will have to "deal" with her multiple pains as the benefit for using this is not worth the risk at this time.     Today, we came up with the following goals:  1) wearing helmet AT ALL TIMES. I voiced my concern and shock that given her frequent falls and the number of ED visits, that wearing a helmet at all times is  obviously important and her self consciousness should be made less of a priority compared to the time she has spent in the ED and number of images, etc.   2) journaling what she remembers with each syncopal episode. I encouraged her to think about what she ate, what position or activity she was doing, who was with her, and more importantly her emotions at the time. I encouraged her to keep this journal herself and keep it private from [facility], as Kurstyn continues to voice her distress over being cared for, and hopefully this will be at  [facility] a little privacy and independence that will help her with this distress.   3) waiting to move or ambulate so that someone is with her at all times, urinating on a regular scheduled basis (q2-3h) to avoid urgency  4) continue monthly f/u with me  5) discuss her desire to go back to [facility] and ask her therapist/psychiatrist to help her reestablish care there. She consents for me to reach out to them today to help portray this.    3. DM (diabetes mellitus), type 2, uncontrolled  - Microalbumin, Urine, Random; Future    Increase Lantus to 28U qhs. I wonder if she is not truly giving herself her medication, given our drastic increase in dose from 10U to 28U over a short time, and the worsening rather than improving A1c. I have  voiced this to [name] and ask that [name] make observing her Lantus injections a part of their daily routine (as Achsah is self administering them)      Follow-up: 28mo      [name], MD  [facility]  06/29/2014  8:59 AM        ______________________

## 2014-06-30 ENCOUNTER — Other Ambulatory Visit: Payer: Self-pay | Admitting: Psychiatry

## 2014-06-30 NOTE — Telephone Encounter (Signed)
Call placed to [facility], inquired if pt had been in to pick up a helmet, she states that they don't carry them, the pt has to come in and be fitted for it and then they order it. There is no copay if they have Medicaid. I provided them her name, they do not have it on file, she states that if they had received a script they would have her in the system. Will send message to PCP to find out.

## 2014-06-30 NOTE — Telephone Encounter (Addendum)
Cardiologist Dr Trilby Leaver had sent it, [facility]. Can you call his office? I could write it again, but would just need to know exactly what to write for. Can they fax over a copy of the script he wrote?    [name], MD  [facility]  06/30/2014  10:52 AM

## 2014-06-30 NOTE — Telephone Encounter (Signed)
Call placed to Dr. Burman Blacksmith with the secretary there, inquired about a script for protective helmet. She states that they faxed 2 x's to [facility], unsure if copy given to pt. Made her aware that I had called [facility] and they said they did not have any faxes for a helmet with pt name on it or she would be in their system. I provided the secretary with [facility] phone number, she states she will call them and get their fax in case they had the wrong number and fax it to them again.

## 2014-07-01 ENCOUNTER — Other Ambulatory Visit: Payer: Self-pay | Admitting: Psychiatry

## 2014-07-01 MED ORDER — BENZTROPINE MESYLATE 1 MG PO TABS *I*
1.0000 mg | ORAL_TABLET | Freq: Two times a day (BID) | ORAL | Status: DC
Start: 2014-07-01 — End: 2014-08-30

## 2014-07-04 ENCOUNTER — Ambulatory Visit: Payer: Self-pay

## 2014-07-04 DIAGNOSIS — F322 Major depressive disorder, single episode, severe without psychotic features: Secondary | ICD-10-CM

## 2014-07-04 NOTE — Progress Notes (Signed)
Behavioral Health Progress Note     LENGTH OF SESSION: 45 minutes    Contact Type:  Location: On Site    Face to Face     Problem(s)/Goals Addressed from Treatment Plan:    Problem 1:   R PSY TP PROBLEM 05/31/2014   1ST TREATMENT PLAN PROBLEM Depression and anxiety       Goal for this problem:    R PSY TP GOAL 05/31/2014   1ST TREATMENT PLAN GOAL Learn DBT and PST skills to manage affect       Progress towards this goal: Problem resolving. Comment Patient reports intention to re-engage in DBT group.    Mental Status Exam:  APPEARANCE: Appears stated age, Well-groomed, Casual  ATTITUDE TOWARD INTERVIEWER: Cooperative  MOTOR ACTIVITY: WNL (within normal limits)  EYE CONTACT: Direct and Indirect  SPEECH: Normal rate and tone and Delayed  AFFECT: Full Range  MOOD: Anxious and Depressed  THOUGHT PROCESS: Circumstantial  THOUGHT CONTENT: Negative Rumination  PERCEPTION: No evidence of hallucinations  ORIENTATION: Alert and Oriented X 3.  CONCENTRATION: Good  MEMORY:   Recent: intact   Remote: intact  COGNITIVE FUNCTION: Average intelligence  JUDGMENT: Intact  IMPULSE CONTROL: Fair and Poor  INSIGHT: Fair and Poor    Risk Assessment:  ASSESSMENT OF RISK FOR SUICIDAL BEHAVIOR  Changes in risk for suicide from baseline Formulation of Risk and/or previous intake, including newly identified risk, if any: none  Violence risk was assessed and No Change noted from baseline formulation of risk and/or previous assessment.    Session Content:: Patient talked to Probation officer about her feelings and fears that writer would be angry with her. Writer validated patient's thoughts and feelings. Patient told Probation officer that she was able to have a conversation with her brother about how she felt about his new wife and how much she misses her deceased SIL. She said that her brother was supportive. Patient said that she has been taking things of her SIL's that brother is getting rid of, but that they are useful things. Writer talked to patient about  continuing in [place] group. Patient was resistant at first, but agreed to do this work. Patient said that she has been wearing her helmet and Probation officer commended patient on this effective practice.    Interventions:    Dialectical Behavioral Therapy (DBT), Supportive Psychotherapy, Treatment Planning    Plan:  Psychotherapy continues as described in care plan; plan remains the same.    NEXT APPT: 07/22/14 @12 :30 and 08/03/14 @2 :[place], LMSW

## 2014-07-06 ENCOUNTER — Emergency Department
Admission: EM | Admit: 2014-07-06 | Disposition: A | Discharge status: Home / Self Care | Payer: Self-pay | Source: Ambulatory Visit | Attending: Emergency Medicine | Admitting: Emergency Medicine

## 2014-07-06 ENCOUNTER — Other Ambulatory Visit: Payer: Self-pay | Admitting: Gastroenterology

## 2014-07-06 ENCOUNTER — Encounter: Payer: Self-pay | Admitting: Hematology and Oncology

## 2014-07-06 LAB — CBC AND DIFFERENTIAL
Baso # K/uL: 0.1 10*3/uL (ref 0.0–0.1)
Basophil %: 0.7 %
Eos # K/uL: 0.2 10*3/uL (ref 0.0–0.4)
Eosinophil %: 1.5 %
Hematocrit: 39 % (ref 34–45)
Hemoglobin: 12.5 g/dL (ref 11.2–15.7)
IMM Granulocytes #: 0.2 10*3/uL — ABNORMAL HIGH (ref 0.0–0.1)
IMM Granulocytes: 1.7 %
Lymph # K/uL: 2.7 10*3/uL (ref 1.2–3.7)
Lymphocyte %: 19.4 %
MCH: 28 pg/cell (ref 26–32)
MCHC: 32 g/dL (ref 32–36)
MCV: 85 fL (ref 79–95)
Mono # K/uL: 0.8 10*3/uL (ref 0.2–0.9)
Monocyte %: 5.4 %
Neut # K/uL: 9.8 10*3/uL — ABNORMAL HIGH (ref 1.6–6.1)
Nucl RBC # K/uL: 0 10*3/uL (ref 0.0–0.0)
Nucl RBC %: 0 /100 WBC (ref 0.0–0.2)
Platelets: 385 10*3/uL — ABNORMAL HIGH (ref 160–370)
RBC: 4.6 MIL/uL (ref 3.9–5.2)
RDW: 13.2 % (ref 11.7–14.4)
Seg Neut %: 71.3 %
WBC: 13.8 10*3/uL — ABNORMAL HIGH (ref 4.0–10.0)

## 2014-07-06 LAB — PLASMA PROF 7 (ED ONLY)
Anion Gap,PL: 19 — ABNORMAL HIGH (ref 7–16)
CO2,Plasma: 19 mmol/L — ABNORMAL LOW (ref 20–28)
Chloride,Plasma: 103 mmol/L (ref 96–108)
Creatinine: 0.71 mg/dL (ref 0.51–0.95)
GFR,Black: 123 *
GFR,Caucasian: 107 *
Glucose,Plasma: 165 mg/dL — ABNORMAL HIGH (ref 60–99)
Potassium,Plasma: 4.2 mmol/L (ref 3.4–4.7)
Sodium,Plasma: 141 mmol/L (ref 132–146)
UN,Plasma: 13 mg/dL (ref 6–20)

## 2014-07-06 LAB — HOLD SST

## 2014-07-06 MED ORDER — ONDANSETRON HCL 2 MG/ML IV SOLN *I*
INTRAMUSCULAR | Status: AC
Start: 2014-07-06 — End: 2014-07-06
  Administered 2014-07-06: 4 mg via INTRAVENOUS
  Filled 2014-07-06: qty 2

## 2014-07-06 MED ORDER — ONDANSETRON HCL 2 MG/ML IV SOLN *I*
4.0000 mg | Freq: Once | INTRAMUSCULAR | Status: AC
Start: 2014-07-06 — End: 2014-07-06

## 2014-07-06 MED ORDER — HYDROMORPHONE HCL PF 1 MG/ML IJ SOLN *WRAPPED*
0.5000 mg | Freq: Once | INTRAMUSCULAR | Status: DC | PRN
Start: 2014-07-06 — End: 2014-07-06

## 2014-07-06 MED ORDER — OXYCODONE-ACETAMINOPHEN 5-325 MG PO TABS *I*
1.0000 | ORAL_TABLET | Freq: Four times a day (QID) | ORAL | Status: DC | PRN
Start: 2014-07-06 — End: 2014-07-14

## 2014-07-06 MED ORDER — HYDROMORPHONE HCL PF 1 MG/ML IJ SOLN *WRAPPED*
0.5000 mg | Freq: Once | INTRAMUSCULAR | Status: AC
Start: 2014-07-06 — End: 2014-07-06
  Administered 2014-07-06: 0.5 mg via INTRAVENOUS
  Filled 2014-07-06: qty 1

## 2014-07-06 NOTE — ED Notes (Signed)
Pt discharge instructions reviewed with and signed by pt.

## 2014-07-06 NOTE — ED Notes (Signed)
Provider bedside assessing pt ability to ambulate to bathroom and back.

## 2014-07-06 NOTE — Discharge Instructions (Signed)
Please call [phone] tomorrow (3/3) to schedule an appointment with the neurosurgery team. Let them know about your fall and the collar you're wearing. You need an appointment in about 7 days to clear you to remove the collar. Until that time, wear it at all times to give your neck good support and protection.    Contact your PCP or return to the emergency department if you're having new numbness, weakness, or tingling in the arms or legs.

## 2014-07-06 NOTE — ED Provider Notes (Addendum)
History     Chief Complaint   Patient presents with    Fall       HPI Comments: Kaylee Harris is a 39 year old woman with a past medical history significant for recurrent syncope, diabetes mellitus on insulin, fibromyalgia, asthma, GERD, depression, anxiety who presents after a syncopal episode.    She notes that she was leaving her therapy group here at [facility] when she suddenly felt unwell just before she got to the elevator. She moved to sit down in her rollator walker but notes that she lost consciousness prior to securing herself and fell foreward, landing on her face. She doesn't think she was unconscious long. She notes that witnesses told her she made jerking movements. She denies incontinence of bowel or bladder. She reports a throbbing anterior headache at this time and also endorses neck pain and back pain. She denies visual changes. She denies paresthesias or weakness. She notes this is a recurrent issue and has brought her to the ED in the past.    On review of records, her loop recorder has been interrogated repeatedly for this sort of event and has never revealed a cardiac correlation. On her prior visits, she has demonstrated narcotic-seeking behavior and her PCP has concerns about this. On her prior visit, she received toradol and per records may have had a local injection reaction. At this time she reports an anaphylactic allergy to Toradol, noting that on her prior trip to the ED she felt as though her throat was closing, she was itching all over, and that she had erythema overlying the vein into which it was injected.    She denies any chest pain, shortness of breath, visual abnormalities prior to her fall. She had been in her usual state of health and denies recent illness.      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 Lower abdomen with cellulitis changes, wound probed and purulent material expressed.  Had PICC line for "multiple infiltrations" of what? D/c-ed home on 10d of Augmentin 9/11.      Depression     Anxiety     Neuromuscular disorder             Past Surgical History   Procedure Laterality Date    Dilation and curettage of uterus  2005, 2007     x2 for menorraghia    Tonsillectomy      Cardiac catheterization  09/2011     negative    Loop recorder  Feb 03 2014    Arthroscopic shoulder surgery Right 2010     Related to lifting    Left colectomy  01/03/14     Dr Tresa Res    Small intestine surgery         Family History  Problem Relation Age of Onset    Hypertension Father     Diabetes Father     Kidney disease Father     Elevated lipids Father     Heart attack Father 36    Hypertension Mother     Elevated lipids Mother     Heart attack Mother 53    Diabetes Mother     Eczema Mother     Psoriasis Mother     Hypertension Brother     Heart disease Brother 48     prinzmetal's angina    Heart disease Sister      currently having work up    [place] Sister     Hypertension Sister     Breast cancer Maternal Grandmother     Stroke           Social History      reports that she has quit smoking. Her smoking use included Cigarettes. She has a 1.5 pack-year smoking history. She has never used smokeless tobacco. She reports that she drinks alcohol. She reports that she currently engages in sexual activity and has had female partners. She reports that she does not use illicit drugs.    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   HENT: Negative for congestion, ear pain, rhinorrhea, sinus pressure, sneezing and sore throat.    Eyes: Negative for pain, discharge and redness.   Respiratory: Negative for cough, chest tightness, shortness of breath and wheezing.    Cardiovascular: Negative for chest pain, palpitations and leg swelling.   Gastrointestinal: Negative for nausea, vomiting, abdominal pain, diarrhea, constipation and abdominal distention.   Genitourinary: Negative for dysuria, urgency, frequency and hematuria.   Musculoskeletal: Positive for back pain and neck pain. Negative for myalgias and arthralgias.   Skin: Negative for color change, pallor, rash and wound.   Neurological: Positive for syncope and headaches. Negative for dizziness, weakness and light-headedness.   Psychiatric/Behavioral: Negative for confusion.       Physical Exam     ED Triage Vitals   BP Heart Rate Heart Rate (via  Pulse Ox) Resp Temp Temp src SpO2 O2 Device O2 Flow Rate   07/06/14 1500 07/06/14 1500 -- 07/06/14 1500 07/06/14 1500 -- 07/06/14 1500 07/06/14 1500 --   136/92 mmHg 109  16 36.6 C (97.9 F)  98 % None (Room air)       Weight           07/06/14 1500           133.358 kg (294 lb)               Physical Exam   Constitutional: She is oriented to person, place, and time. She appears well-developed and well-nourished. No distress.   HENT:   Head: Normocephalic.   Mouth/Throat: Oropharynx is clear and moist. No oropharyngeal exudate.   c-collar in place   Eyes: Conjunctivae are normal. Right eye exhibits no discharge. Left eye exhibits no discharge. No scleral icterus.   Cardiovascular: Normal rate, regular rhythm, normal heart sounds and intact distal pulses.  Exam reveals no gallop and no friction rub.    No murmur heard.  Pulmonary/Chest: Effort normal and breath sounds normal. No stridor. No respiratory distress. She has no wheezes. She has no rales. She exhibits no tenderness.   Abdominal: Soft. Bowel sounds are normal. She exhibits no distension and no mass. There is no tenderness. There is no rebound and no guarding.   Musculoskeletal: She exhibits no edema or tenderness.   Neurological: She is alert and oriented to person, place, and time.   Moving all extremities, no focal def.    Skin: Skin is warm and dry. No rash noted. She is not diaphoretic. No erythema. No pallor.   Psychiatric: She has a normal mood and affect.   Nursing note and vitals reviewed.      Medical Decision Making      Amount and/or Complexity of Data Reviewed  Clinical lab tests: reviewed  Tests in the radiology section of CPT: reviewed and ordered  Review and summarize past medical records: yes  Independent visualization of images, tracings, or specimens: yes        Initial Evaluation:  ED First Provider Contact     Date/Time Event User Comments    07/06/14 1514 ED Provider First Contact [facility], COLIN Initial Face to Face Provider Contact           Patient seen by me on arrival date of 07/06/2014 at time of arrival  2:49 PM.  Initial face to face evaluation time noted above may be discrepant due to patient acuity and delay in documentation.    Assessment:  39 y.o., female comes to the ED with syncope    Differential Diagnosis includes vasovagal syncope, less likely arrhythmia, mechanical fall, myocardial ischemia, ICH, concussion, c spine injury.                Plan:   - plasma profile 7  - cbc with differential  - ct scan head non-contrast  - ct scan neck non-contrast  - telemetry monitoring  - IV dilaudid for pain and IV zofran for nausea     Cyndy Freeze, MD      Cyndy Freeze, MD  Resident  07/06/14 [phone]    Resident Attestation:     Patient seen by me on arrival date of 07/06/2014 at 1557    History:   I reviewed this patient, reviewed the resident's note and agree.  Exam:   I examined this patient, reviewed the resident's note and agree.  Decision Making:   I discussed with the resident his/her documented decision making  and agree.      Author Deniece Portela, MD      Deniece Portela, MD  07/06/14 [phone]

## 2014-07-06 NOTE — ED Notes (Signed)
Was leaving group today and fell today. MERT response and found face down. Alert and oriented. C/o pain in neck, back and forehead. Hx of drug seeking behavior and has protocol. Collar and board in place.
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
## 2014-07-07 NOTE — Progress Notes (Signed)
Clinical Management Note     Client attended her first [place] skills group with this writer on Wednesday, July 06, 2014.  At the conclusion of group, client headed to the elevators to exit the building.  Apparently client had a seizure.  A staff member asked Probation officer to come to Chief Executive Officer, Probation officer went to Chief Executive Officer, attempted to engage [place], [place]ost was unable to respond.  She was laying on her stomach in front of the elevator.  A staff member from housekeeping called for the MERT.  Client was taken to the emergency room via stretcher once the MERT arrived.  Writer reported this incident to clients individual therapist as well as Sharyn Lull C.

## 2014-07-08 ENCOUNTER — Other Ambulatory Visit: Payer: Self-pay

## 2014-07-08 ENCOUNTER — Telehealth: Payer: Self-pay

## 2014-07-08 NOTE — Telephone Encounter (Signed)
Call to pt for [facility] ED F/U, no answer,left name and number for call back

## 2014-07-11 ENCOUNTER — Encounter: Payer: Self-pay | Admitting: Thoracic Diseases

## 2014-07-11 ENCOUNTER — Ambulatory Visit: Payer: Self-pay

## 2014-07-11 ENCOUNTER — Ambulatory Visit: Payer: Self-pay | Admitting: Thoracic Diseases

## 2014-07-11 VITALS — BP 133/65 | HR 110 | Ht 67.99 in | Wt 294.0 lb

## 2014-07-11 DIAGNOSIS — M542 Cervicalgia: Secondary | ICD-10-CM | POA: Insufficient documentation

## 2014-07-11 NOTE — Progress Notes (Signed)
Walk-in Visit    Patient name: Kaylee Harris     Pain    07/11/14 1035   PainSc:   8         The Patient was provided with the following items:  Orthosis:  Side:: na  Size:: Stout  Model:: [place] J Collar  Manufacturer: [employer]  Part Number: [id number]  Warranty:: 90 Days    Functional Goals: Immobilization/Reduce joint motion  Provide joint stability    ROM settings: N/A    Expected Frequency / Duration of Use:   Per physician orders    Modifications made: No modifications were required at this time    Complexity:   Fitting was routine, requiring little to no adjustments    Yes, Device was inspected for safety/security and found to be functioning properly    Ordered/Delivered: Device Delivered    The patient given verbal and written instructions.  The following Instructions were reviewed: Purpose of device, Cleaning / Care of device, Potential Risks / Benefits, Frequency / Duration of use and How to report potential failure / malfunctions.    Device Comfort Score: 10

## 2014-07-11 NOTE — Telephone Encounter (Signed)
2nd call placed to pt, no answer,left message, it is noted that pt has an appt scheduled for 3/10 in this office

## 2014-07-11 NOTE — Progress Notes (Signed)
History and Physical    HISTORY:  Chief Complaint   Patient presents with    New Patient Visit         History of Present Illness:    HPI Comments: Kaylee Harris is a 39 year old woman with a history of neurocardiogenic syncope who has an event on Wednesday 07/06/2014.  When she came to, she noted significant pain in the neck radiating to the right shoulder and bicep region and to the head where she struck it during the fall.  She was evaluated with CT scans of the cervical spine and head that showed no acute abnormalities.  Since last week, she has continued to experience significant neck pain that she rates 8/10 on a visual analog scale.  She is wearing a rigid collar at all times as instructed.  The front of the collar is a size small and digs into the front of her throat and is quite uncomfortable.      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    Major depression, recurrent F33.9    IUD (intrauterine device) in place Z97.5    Skin lesion of face L98.9    IBS (irritable bowel syndrome) K58.9    Anxiety F41.9    OCD (obsessive compulsive disorder) F42    Carpal tunnel syndrome G56.00    Meralgia paresthetica of left side G57.12    No diabetic retinopathy in both eyes Z03.89    At risk for abuse of opiates Z78.9    History of repeated overdose Z91.5    Borderline personality disorder F60.3    Neck pain M54.2        Past Medical/Surgical History:   Past Medical History   Diagnosis Date    Diabetes mellitus      Previously on SU and metformin, now diet controlled    Asthma     GERD (gastroesophageal reflux disease)     Dysfunctional uterine bleeding     Diverticulitis 09/2010    Sebaceous cyst of breast      right axilla    Anginal pain     Hyperlipidemia     Varicella     Complication of anesthesia      Arthritis     Abscess of abdominal wall 01/18/2014     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 Lower abdomen with cellulitis changes, wound probed and purulent material expressed.  Had PICC line for "multiple infiltrations" of what? D/c-ed home on 10d of Augmentin 9/11.      Depression     Anxiety     Neuromuscular disorder      Past Surgical History   Procedure Laterality Date    Dilation and curettage of uterus  2005, 2007     x2 for menorraghia    Tonsillectomy      Cardiac catheterization  09/2011     negative    Loop recorder  Feb 03 2014    Arthroscopic shoulder surgery Right 2010     Related to lifting    Left colectomy  01/03/14     Dr Tresa Res    Small intestine surgery           Allergies:    Allergies   Allergen Reactions  Morphine Anaphylaxis    Toradol [Ketorolac Tromethamine] Anaphylaxis     Notes feeling tightness in her throat and full body itching after last dose a month ago. Also notes erythema tracking along her vein after injection. This is not reflected by documentation.    Trazodone Anaphylaxis       Current medications:    Current Outpatient Prescriptions   Medication Sig    benztropine (COGENTIN) 1 MG tablet Take 1 tablet (1 mg total) by mouth 2 times daily    gabapentin (NEURONTIN) 100MG  capsule Take 2 tabs (200mg ) BID x10d, then 1 tab BID x10d, then 1 tab daily x10d    insulin glargine (LANTUS SOLOSTAR) 100 UNIT/ML injection pen Inject 28 Units into the skin nightly    mirtazapine (REMERON) 7.5 MG tablet Take 1 tablet (7.5 mg total) by mouth nightly    topiramate (TOPAMAX) 100 MG tablet     busPIRone (BUSPAR) 30 MG tablet Take 1 tablet (30 mg total) by mouth 2 times daily    lurasidone (LATUDA) 80 MG tablet Take 1 tablet (80 mg total) by mouth daily    oxyCODONE-acetaminophen (PERCOCET) 5-325 MG per tablet Take 1 tablet by mouth daily as needed for Pain   Max daily dose: 1 tablet    SUMAtriptan Succinate (IMITREX) 6 MG/0.5ML SOLN injection  Inject 6 mg into the skin ONCE PRN for Headaches    zolpidem (AMBIEN CR) 6.25 MG CR tablet Take 1 tablet (6.25 mg total) by mouth nightly as needed for Sleep   Max daily dose: 6.25 mg Swallow whole. Do not crush, break, or chew.    omeprazole (PRILOSEC) 20 MG capsule Take 1 capsule (20 mg total) by mouth daily (before breakfast)    insulin pen needle (NOVOFINE) 30G X 8 MM Use 1 times a day as instructed.    clonazePAM (KLONOPIN) 0.5 MG tablet Take 1 tablet (0.5 mg total) by mouth daily as needed for Anxiety   Max daily dose: 0.5 mg    DULoxetine (CYMBALTA) 60 MG capsule Take 1 capsule (60 mg total) by mouth daily    lancets Brand Free Style Lite; Use 2 times per day as directed for blood glucose testing.    FREESTYLE LITE test strip Use BID as directed for 250.02    Alcohol Swabs (ALCOHOL WIPES) PADS Use BID for BG check    levonorgestrel (MIRENA) 20 MCG/24HR IUD 1 each by Intrauterine route once    VENTOLIN HFA 108 (90 BASE) MCG/ACT inhaler Inhale 1-2 puffs into the lungs every 4-6 hours as needed for Wheezing    naproxen sodium (ANAPROX) 550 MG tablet TAKE 1 TABLET (550 MG TOTAL) BY MOUTH 2 TIMES DAILY (WITH MEALS)    Melatonin 5 MG CAPS Take 10 mg by mouth nightly    SUMAtriptan (IMITREX) 50 MG tablet Take 1 tablet (50 mg total) by mouth as needed for Migraine   Take at onset of headache. May repeat once in 2 hours.    fluticasone (FLONASE) 50 MCG/ACT nasal spray 1 spray by Nasal route daily    cyclobenzaprine (FLEXERIL) 5 MG tablet TAKE 1 TABLET (5 MG TOTAL) BY MOUTH 3 TIMES DAILY AS NEEDED FOR MUSCLE SPASMS    dicyclomine (BENTYL) 20 MG tablet     famciclovir (FAMVIR) 500 MG tablet Take 1 tablet (500 mg total) by mouth 3 times daily as needed    blood glucose monitor system Brand: cheapest brand available per her insurance.  Use as directed.    Non-System Medication The above  patient is followed in our clinic and cannot resume work permanently.    ondansetron (ZOFRAN) 8 MG tablet Take 1  tablet (8 mg total) by mouth 3 times daily as needed for Nausea       Family History:    Family History   Problem Relation Age of Onset    Hypertension Father     Diabetes Father     Kidney disease Father     Elevated lipids Father     Heart attack Father 13    Hypertension Mother     Elevated lipids Mother     Heart attack Mother 49    Diabetes Mother     Eczema Mother     Psoriasis Mother     Hypertension Brother     Heart disease Brother 61     prinzmetal's angina    Heart disease Sister      currently having work up    [place] Sister     Hypertension Sister     Breast cancer Maternal Grandmother     Stroke         Social/Occupational History:   History     Social History    Marital Status: Married     Spouse Name: N/A     Number of Children: N/A    Years of Education: N/A     Social History Main Topics    Smoking status: Former Smoker -- 0.50 packs/day for 3 years     Types: Cigarettes    Smokeless tobacco: Never Used    Alcohol Use: 0.0 oz/week     0 Not specified per week      Comment: <1 weekly    Drug Use: No    Sexual Activity:     Partners: Female     Other Topics Concern    None     Social History Narrative    Married to [place], recently relocated back to [place] from [place] due to family stressors. 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          Review of Systems:    Review of Systems   Constitutional: Negative.    Respiratory: Negative.    Cardiovascular: Negative.    Gastrointestinal: Negative.    Musculoskeletal: Positive for falls and neck pain.   Skin: Negative.    Neurological: Positive for headaches. Negative for sensory change and focal weakness.       Vital Signs:   BP 133/65 mmHg   Pulse 110   Ht 1.727 m (5' 7.99")   Wt 133.358 kg (294 lb)   BMI 44.71 kg/m2      PHYSICAL EXAM:  Physical Exam   Constitutional: She is oriented to person, place, and time. She appears well-developed and well-nourished. She is cooperative. No  distress.   HENT:   Head: Normocephalic.   Musculoskeletal:        Cervical back: She exhibits tenderness.   Wearing rigid cervical collar as instructed.  Front removed to assess skin and is clearly rubbing on her skin.  No skin breakdown, but irritation noted.   Neurological: She is alert and oriented to person, place, and time. She has normal strength. No sensory deficit. Gait (uses rolling walker) abnormal. GCS eye subscore is 4. GCS verbal subscore is 5. GCS motor subscore is 6.   Unable to obtain reflexes in BUE, likely due to body habitus.   Skin: Skin is warm, dry and intact.  Psychiatric: She has a normal mood and affect. Her speech is normal and behavior is normal. Cognition and memory are normal.           Assessment:    Cabella was seen today for new patient visit.    Diagnoses and associated orders for this visit:    Neck pain  - ORTHOTIC, [place] J COLLAR      CT scans of the head and neck performed on 07/06/2014 at [facility] were reviewed.  There is loss of normal cervical spine lordosis likely due to muscle spasm.      Plan:       Zitlaly Malson is a 39 year old woman with ongoing neck pain following a fall.  I am unable to clear her collar due to the severity of the pain.  She is scheduled for MRI scans of the cervical spine and head on Thursday 07/14/2014.  Once those have been obtained, we can discuss the results and the best plan of action.  In the meantime, the front of her cervical collar really is too small and causing irritation of the underlying skin.  She was provided with a requisition to obtain a new collar from the orthotics lab.

## 2014-07-11 NOTE — Patient Instructions (Signed)
[facility]      Your physician has determined that you require Prefabricated (off-the-shelf) Orthotic and Prosthetic (O&P) devices and/or Durable Medical Equipment (DME).  O&P devices include items such as braces for the spine or limbs, while DME includes items such as canes, crutches and walkers.  For your convenience you were fitted by the [facility].    Return Policy:    • Prefabricated O&P and DME items are not returnable if used outside of clinic due to hygiene concerns.    • Special or custom orders are not returnable or refundable.    • O&P devices and DME purchased from the [facility] may only be exchanged if the item is faulty or poorly fitting, as determined by the O&P Department Clinical Coordinator.  Exchanges will be made using the same type of device, if within 7 days of the purchase date.    • No exchange will be issued for items that were not used in accordance with the manufacturer’s recommended standard of care.    • Replacement or repair of minor parts could become necessary due to wear.  This may include a charge and an order from you physician may be required.  Patient Instructions for [place] Cervical Collar    Purpose of device   You have been provided with a [place] cervical collar (c-collar) that your physician has prescribed for wear.  The purpose of your c-collar is to provide cervical immobilization following injury or fracture.      Wear Instructions  Your Cervical Collar is a 2 part brace (front and back).   The back of the collar fully laps over the front of the brace.    C-collar is appropriate for use directly against the skin.    Place the front of the collar under your chin and attach the Velcro strap from one side of the back of the brace to the side of the front portion of the brace.  Close the brace snug around the neck with the opposite Velcro strap.    Make adjustments to your straps so that  your collar is centered on your neck.  It is important to wear your collar as snug as tolerable.  Wearing c-collar too loose with reduce effectiveness.    If blue pads become soiled they can be hand washed in mild detergent and air dried.  Replacement pads are available if pads become excessively soil or ruined.  Contact your Orthotist if you need to obtain a set of replacement pads.   Rigid frame of collar can be hand-wash the brace with mild soap and water.  Velcro straps can be cleaned with a bristle brush.     Cleaning and Care  Keeping your [place] J and the skin beneath clean is an important part of your treatment.   Daily cleaning will help to prevent skin irritation.   When you are at home you should shower or bathe with the collar on.   Change pads while lying down: Lie flat in bed without a pillow. Keep your head in a neutral position.   Peel the soiled, blue pads off. Look carefully at the shape as you remove them so that you can reposition the clean pads properly. The pads attach with velcro.   Wash the pads with mild facial soap and water. DO NOT use bleach or harsh detergents.   Thoroughly rinse the pads with clean water. Wring out the   excess water and squeeze in a towel. Lay the pads out flat to air dry. It should take less than 60 minutes for them to dry.   Wipe the white plastic collar shell clean with mild soap and water.    Frequency / Duration of use   Overall benefit of brace wear will depend on compliance.  It will be important to follow-up your physician to determine whether optimal outcomes have been achieved.    Your physician will determine overall length of time you will need to wear your brace.      Potential Risks / Benefits   You must be lying flat to remove the collar unless your doctor gives you permission to do this in a sitting position.   Unless otherwise specified by your doctor: Do not remove the collar except to wash under it and change the pads.    How to report potential  failure/malfunction   If your brace begins to no longer function as originally fit or intended please contact [facility] at [phone].

## 2014-07-12 ENCOUNTER — Ambulatory Visit: Payer: Self-pay | Admitting: Primary Care

## 2014-07-12 LAB — EKG 12-LEAD
P: 27 degrees
QRS: 52 degrees
Rate: 100 {beats}/min
Severity: ABNORMAL
Severity: ABNORMAL
Statement: BORDERLINE
T: 35 degrees

## 2014-07-13 ENCOUNTER — Other Ambulatory Visit: Payer: Self-pay | Admitting: Psychiatry

## 2014-07-13 ENCOUNTER — Encounter: Payer: Self-pay | Admitting: Gastroenterology

## 2014-07-13 ENCOUNTER — Other Ambulatory Visit: Payer: Self-pay | Admitting: Primary Care

## 2014-07-13 ENCOUNTER — Other Ambulatory Visit: Payer: Self-pay | Admitting: Hematology and Oncology

## 2014-07-14 ENCOUNTER — Ambulatory Visit: Payer: Self-pay | Admitting: Primary Care

## 2014-07-14 ENCOUNTER — Encounter: Payer: Self-pay | Admitting: Primary Care

## 2014-07-14 VITALS — BP 100/60 | HR 106 | Temp 98.6°F | Ht 67.0 in | Wt 300.0 lb

## 2014-07-14 DIAGNOSIS — IMO0002 Reserved for concepts with insufficient information to code with codable children: Secondary | ICD-10-CM

## 2014-07-14 DIAGNOSIS — F488 Other specified nonpsychotic mental disorders: Secondary | ICD-10-CM

## 2014-07-14 DIAGNOSIS — F603 Borderline personality disorder: Secondary | ICD-10-CM

## 2014-07-14 DIAGNOSIS — F332 Major depressive disorder, recurrent severe without psychotic features: Secondary | ICD-10-CM

## 2014-07-14 MED ORDER — OXYCODONE-ACETAMINOPHEN 5-325 MG PO TABS *I*
1.0000 | ORAL_TABLET | Freq: Two times a day (BID) | ORAL | Status: AC | PRN
Start: 2014-07-14 — End: 2014-07-16

## 2014-07-14 MED ORDER — OMEPRAZOLE 20 MG PO CPDR *I*
DELAYED_RELEASE_CAPSULE | ORAL | Status: DC
Start: 2014-07-14 — End: 2014-08-31

## 2014-07-14 MED ORDER — RANITIDINE HCL 150 MG PO CAPS *A*
150.0000 mg | ORAL_CAPSULE | Freq: Two times a day (BID) | ORAL | Status: DC
Start: 2014-07-14 — End: 2014-11-25

## 2014-07-14 NOTE — Telephone Encounter (Signed)
Please do istop  [name], MD  [facility]  07/14/2014  3:49 PM

## 2014-07-14 NOTE — Telephone Encounter (Signed)
I stop done

## 2014-07-14 NOTE — Patient Instructions (Signed)
Your omeprazole we want to get you off it EVENTUALLY (like in 3 months).  Start taking ranitidine at night for 2 weeks. Then take it in the morning for 2 weeks.  Then take your omeprazole every other day for 1 month, then twice a week for 1 month, then stop. Continue the ranitidine either once or twice a day while you are weaning this.
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
## 2014-07-18 ENCOUNTER — Telehealth: Payer: Self-pay | Admitting: Thoracic Diseases

## 2014-07-18 ENCOUNTER — Ambulatory Visit: Payer: Self-pay | Admitting: Psychiatry

## 2014-07-18 ENCOUNTER — Encounter: Payer: Self-pay | Admitting: Psychiatry

## 2014-07-18 VITALS — BP 155/90 | HR 102 | Ht 66.93 in | Wt 296.0 lb

## 2014-07-18 DIAGNOSIS — F603 Borderline personality disorder: Secondary | ICD-10-CM

## 2014-07-18 DIAGNOSIS — F339 Major depressive disorder, recurrent, unspecified: Secondary | ICD-10-CM

## 2014-07-18 DIAGNOSIS — F429 Obsessive-compulsive disorder, unspecified: Secondary | ICD-10-CM

## 2014-07-18 DIAGNOSIS — F449 Dissociative and conversion disorder, unspecified: Secondary | ICD-10-CM

## 2014-07-18 MED ORDER — BUSPIRONE HCL 30 MG PO TABS *I*
30.0000 mg | ORAL_TABLET | Freq: Two times a day (BID) | ORAL | Status: DC
Start: 2014-07-18 — End: 2014-09-07

## 2014-07-18 NOTE — Telephone Encounter (Signed)
Patient is wondering she can get her MRI results regarding her collar. No follow up was scheduled for her

## 2014-07-18 NOTE — Progress Notes (Addendum)
Behavioral Health Psychopharmacology Follow-up     Length of Session: 20 minutes.    Diagnosis Addressed    ICD-10-CM ICD-9-CM   1. Conversion disorder F44.9 300.11   2. MDD (major depressive disorder), recurrent episode F33.9 296.30   3. OCD (obsessive compulsive disorder) F42 300.3   4. Borderline personality disorder F60.3 301.83       Recent History and Response to Medications  Patient states:   HPI     Follow-up    Additional comments: patient here for medication evaluation             Current use of alcohol or drugs: No        Neurovegetative Symptoms Review:  Energy level: poor  Concentration: poor  Sleep Quality: good (awakens during the night due to neck brace that has been d/c)   Number of hours : 8  Appetite: fair (not hungry for nutritious foods, craving junk foods)     Wt Readings from Last 3 Encounters:   07/18/14 134.265 kg (296 lb)   07/14/14 136.079 kg (300 lb)   07/11/14 133.358 kg (294 lb)     Enjoyment/interest: poor        Current Medications  Current Outpatient Prescriptions   Medication Sig    omeprazole (PRILOSEC) 20 MG capsule Take every other day for 4 weeks, then every 3 days for 2 weeks, then stop    ranitidine (ZANTAC) 150 MG capsule Take 1 capsule (150 mg total) by mouth 2 times daily    benztropine (COGENTIN) 1 MG tablet Take 1 tablet (1 mg total) by mouth 2 times daily    gabapentin (NEURONTIN) 100MG  capsule Take 2 tabs (200mg ) BID x10d, then 1 tab BID x10d, then 1 tab daily x10d    insulin glargine (LANTUS SOLOSTAR) 100 UNIT/ML injection pen Inject 28 Units into the skin nightly (Patient taking differently: Inject 30 Units into the skin nightly   )    mirtazapine (REMERON) 7.5 MG tablet Take 1 tablet (7.5 mg total) by mouth nightly    topiramate (TOPAMAX) 100 MG tablet     busPIRone (BUSPAR) 30 MG tablet Take 1 tablet (30 mg total) by mouth 2 times daily    lurasidone (LATUDA) 80 MG tablet Take 1 tablet (80 mg total) by mouth daily    SUMAtriptan Succinate (IMITREX) 6  MG/0.5ML SOLN injection Inject 6 mg into the skin ONCE PRN for Headaches    zolpidem (AMBIEN CR) 6.25 MG CR tablet Take 1 tablet (6.25 mg total) by mouth nightly as needed for Sleep   Max daily dose: 6.25 mg Swallow whole. Do not crush, break, or chew.    insulin pen needle (NOVOFINE) 30G X 8 MM Use 1 times a day as instructed.    clonazePAM (KLONOPIN) 0.5 MG tablet Take 1 tablet (0.5 mg total) by mouth daily as needed for Anxiety   Max daily dose: 0.5 mg    DULoxetine (CYMBALTA) 60 MG capsule Take 1 capsule (60 mg total) by mouth daily    lancets Brand Free Style Lite; Use 2 times per day as directed for blood glucose testing.    FREESTYLE LITE test strip Use BID as directed for 250.02    Alcohol Swabs (ALCOHOL WIPES) PADS Use BID for BG check    levonorgestrel (MIRENA) 20 MCG/24HR IUD 1 each by Intrauterine route once    VENTOLIN HFA 108 (90 BASE) MCG/ACT inhaler Inhale 1-2 puffs into the lungs every 4-6 hours as needed for Wheezing  naproxen sodium (ANAPROX) 550 MG tablet TAKE 1 TABLET (550 MG TOTAL) BY MOUTH 2 TIMES DAILY (WITH MEALS)    Melatonin 5 MG CAPS Take 10 mg by mouth nightly    SUMAtriptan (IMITREX) 50 MG tablet Take 1 tablet (50 mg total) by mouth as needed for Migraine   Take at onset of headache. May repeat once in 2 hours.    fluticasone (FLONASE) 50 MCG/ACT nasal spray 1 spray by Nasal route daily    cyclobenzaprine (FLEXERIL) 5 MG tablet TAKE 1 TABLET (5 MG TOTAL) BY MOUTH 3 TIMES DAILY AS NEEDED FOR MUSCLE SPASMS    dicyclomine (BENTYL) 20 MG tablet     famciclovir (FAMVIR) 500 MG tablet Take 1 tablet (500 mg total) by mouth 3 times daily as needed    blood glucose monitor system Brand: cheapest brand available per her insurance.  Use as directed.    Non-System Medication The above patient is followed in our clinic and cannot resume work permanently.    ondansetron (ZOFRAN) 8 MG tablet Take 1 tablet (8 mg total) by mouth 3 times daily as needed for Nausea     No current  facility-administered medications for this visit.       Side Effects  Patient Reported Side Effects: None reported    Mental Status  APPEARANCE: Casual  ATTITUDE TOWARD INTERVIEWER: Cooperative  MOTOR ACTIVITY: WNL (within normal limits)  EYE CONTACT: Direct  SPEECH: Normal rate and tone  AFFECT: Full Range and Appropriate  MOOD: Anxious and Depressed  THOUGHT PROCESS: Normal  THOUGHT CONTENT: No unusual themes  PERCEPTION: Hallucinations Auditory and "just the music at night."   ORIENTATION: Alert and Oriented X 3.  CONCENTRATION: Good  MEMORY:   Recent: intact   Remote: intact  COGNITIVE FUNCTION: Average intelligence  JUDGMENT: Intact  IMPULSE CONTROL: Fair  INSIGHT: Fair    Risk Assessment    Self Injury: Patient Denies  Suicidal Ideation: Patient Denies  Homicidal Ideation: Patient Denies  Aggressive Behavior: Patient Denies    Results  none    BP Readings from Last 3 Encounters:   07/18/14 155/90   07/14/14 100/60   07/11/14 133/65       Assessment  FORMULATION: Patient reports that with the increase in Buspar "I'm not as anxious."   The cogentin continues to be helpful in decreasing the side effects of the Latuda.  She also got a prescription from her neurologist, Jana Half [place]-Lightfoot for an increase in [place].  We discussed that she will need to continue to prescribe the Klonopin to which she agreed.  We discussed not making any additional changes in her medication to which she agreed.      Recommendations/Plan and Rationale  PLAN:  Continue current medications.  Return in 6 weeks.

## 2014-07-18 NOTE — Telephone Encounter (Signed)
MRI scan of the cervical spine was reviewed and shows no acute injury.  Dina aware and may discontinue wearing the collar.  She may perform activity as tolerated.

## 2014-07-20 ENCOUNTER — Encounter: Payer: Self-pay | Admitting: Gastroenterology

## 2014-07-20 ENCOUNTER — Encounter: Payer: Self-pay | Admitting: Emergency Medicine

## 2014-07-20 ENCOUNTER — Emergency Department
Admission: EM | Admit: 2014-07-20 | Disposition: A | Discharge status: Home / Self Care | Payer: Self-pay | Source: Ambulatory Visit

## 2014-07-20 LAB — PLASMA PROF 7 (ED ONLY)
Anion Gap,PL: 15 (ref 7–16)
CO2,Plasma: 21 mmol/L (ref 20–28)
Chloride,Plasma: 104 mmol/L (ref 96–108)
Creatinine: 0.8 mg/dL (ref 0.51–0.95)
GFR,Black: 106 *
GFR,Caucasian: 92 *
Glucose,Plasma: 122 mg/dL — ABNORMAL HIGH (ref 60–99)
Potassium,Plasma: 4 mmol/L (ref 3.4–4.7)
Sodium,Plasma: 140 mmol/L (ref 132–146)
UN,Plasma: 10 mg/dL (ref 6–20)

## 2014-07-20 LAB — RUQ PANEL (ED ONLY)
ALT: 17 U/L (ref 0–35)
AST: 12 U/L (ref 0–35)
Albumin: 4.3 g/dL (ref 3.5–5.2)
Alk Phos: 99 U/L (ref 35–105)
Amylase: 47 U/L (ref 28–100)
Bilirubin,Direct: <0.2 mg/dL (ref 0.0–0.3)
Bilirubin,Total: <0.2 mg/dL (ref 0.0–1.2)
Lipase: 41 U/L (ref 13–60)
Total Protein: 7 g/dL (ref 6.3–7.7)

## 2014-07-20 MED ORDER — ACETAMINOPHEN 325 MG PO TABS *I*
650.0000 mg | ORAL_TABLET | Freq: Once | ORAL | Status: DC
Start: 2014-07-20 — End: 2014-07-21
  Filled 2014-07-20: qty 2

## 2014-07-20 MED ORDER — ONDANSETRON 4 MG PO TBDP *I*
4.0000 mg | ORAL_TABLET | Freq: Once | ORAL | Status: AC
Start: 2014-07-20 — End: 2014-07-20
  Administered 2014-07-20: 4 mg via ORAL
  Filled 2014-07-20: qty 1

## 2014-07-20 MED ORDER — PROMETHAZINE HCL 25 MG/ML IJ SOLN *I*
12.5000 mg | Freq: Once | INTRAMUSCULAR | Status: AC
Start: 2014-07-20 — End: 2014-07-20
  Administered 2014-07-20: 12.5 mg via INTRAVENOUS
  Filled 2014-07-20: qty 1

## 2014-07-20 MED ORDER — NAPROXEN 250 MG PO TABS *I*
500.0000 mg | ORAL_TABLET | Freq: Two times a day (BID) | ORAL | Status: DC
Start: 2014-07-20 — End: 2014-07-21
  Administered 2014-07-20: 500 mg via ORAL
  Filled 2014-07-20: qty 2
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
## 2014-07-20 NOTE — ED Notes (Signed)
PA notified about patients request.

## 2014-07-20 NOTE — ED Notes (Signed)
C/o headache and nausea

## 2014-07-20 NOTE — ED Notes (Signed)
Pt was MERT response upstairs pt states she was seizing on the floor and hit front of head. Pt states "tylenol does nothing for me, they usually give me dilaudid". Requesting this RN to ask MD if they can change order.

## 2014-07-20 NOTE — Discharge Instructions (Signed)
Electrolytes are within normal limits except for glucose which was 122.  A dose of naproxen was given for pain/headache.  Patient states that the pain in her head did not decrease significantly.  Patient did request a dose of Dilaudid for pain however I discussed with her that this is not a recommended treatment for headache.. Patient was advised that she will be discharged home. she should rest for this evening and follow-up with her primary neurologist tomorrow to discuss the headaches/ seizure activity and the syncope.

## 2014-07-20 NOTE — ED Provider Notes (Addendum)
History     Chief Complaint   Patient presents with    Other     PseudoSeizure       HPI Comments: Patient is a 39 year old female who had an unwitnessed episode that they reported a seizure.  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 Lower abdomen with cellulitis changes, wound probed and purulent material expressed.  Had PICC line for "multiple infiltrations" of what? D/c-ed home on 10d of Augmentin 9/11.      Depression     Anxiety     Neuromuscular disorder             Past Surgical History   Procedure Laterality Date    Dilation and curettage of uterus  2005, 2007     x2 for menorraghia    Tonsillectomy      Cardiac catheterization  09/2011     negative    Loop recorder  Feb 03 2014    Arthroscopic shoulder surgery Right 2010     Related to lifting    Left colectomy  01/03/14     Dr Tresa Res    Small intestine surgery         Family History   Problem Relation Age of Onset    Hypertension Father     Diabetes Father     Kidney disease Father     Elevated lipids Father     Heart attack Father 37    Hypertension Mother     Elevated lipids Mother     Heart attack Mother 73    Diabetes Mother     Eczema Mother     Psoriasis Mother     Hypertension Brother     Heart disease Brother 34     prinzmetal's angina    Heart disease Sister      currently having work up     [place] Sister     Hypertension Sister     Breast cancer Maternal Grandmother     Stroke           Social History      reports that she has quit smoking. Her smoking use included Cigarettes. She has a 1.5 pack-year smoking history. She has never used smokeless tobacco. She reports that she drinks alcohol. She reports that she currently engages in sexual activity and has had female partners. She reports that she does not use illicit drugs.    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   HENT: Positive for congestion. Negative for dental problem, drooling, facial swelling, hearing loss, sneezing, sore throat and trouble swallowing.    Eyes: Negative for pain, discharge and redness.   Respiratory: Negative for apnea, cough, shortness of breath, wheezing and stridor.    Cardiovascular: Negative for chest pain.    Gastrointestinal: Positive for nausea. Negative for vomiting, diarrhea, constipation, blood in stool and abdominal distention.   Endocrine: Negative for cold intolerance.   Genitourinary: Negative for frequency and difficulty urinating.   Musculoskeletal: Negative for back pain, neck pain and neck stiffness.   Skin: Negative for color change and rash.   Allergic/Immunologic: Negative for environmental allergies.   Neurological: Positive for syncope and headaches. Negative for dizziness, seizures, speech difficulty, weakness and numbness.   Psychiatric/Behavioral: Negative for sleep disturbance and agitation. The patient is not nervous/anxious.    All other systems reviewed and are negative.      Physical Exam     ED Triage Vitals   BP Heart Rate Heart Rate (via Pulse Ox) Resp Temp Temp src SpO2 O2 Device O2 Flow Rate   07/20/14 1421 07/20/14 1421 -- 07/20/14 1421 07/20/14 1421 07/20/14 1421 07/20/14 1421 07/20/14 1421 --   134/84 mmHg 108  16 36 C (96.8 F) TEMPORAL 98 % None (Room air)       Weight           07/20/14 1421           131.543 kg (290 lb)               Physical Exam   Constitutional: She is oriented to person, place, and time. She appears well-developed and well-nourished.   HENT:   Head: Normocephalic and atraumatic.   Right Ear: External ear normal.   Left Ear: External ear normal.   Nose: Nose normal.   Mouth/Throat: Oropharynx is clear and moist. No oropharyngeal exudate.   Eyes: Conjunctivae and EOM are normal. Pupils are equal, round, and reactive to light. Right eye exhibits no discharge. Left eye exhibits no discharge.   Neck: Normal range of motion. Neck supple. No JVD present.   Cardiovascular: Normal rate, regular rhythm, normal heart sounds and intact distal pulses.  Pulmonary/Chest: Effort normal and breath sounds normal. No stridor.   Abdominal: Soft. Bowel sounds are normal. She exhibits no distension. There is no tenderness. There is no rebound and no guarding.   Musculoskeletal:  Normal range of motion. She exhibits no edema or tenderness.   Lymphadenopathy:     She has no cervical adenopathy.   Neurological: She is alert and oriented to person, place, and time. She has normal reflexes. She displays normal reflexes. She exhibits normal muscle tone. Coordination normal.   Skin: Skin is warm.   Psychiatric: Her behavior is normal. Judgment and thought content normal.   Nursing note and vitals reviewed.      Medical Decision Making      Amount and/or Complexity of Data Reviewed  Clinical lab tests: ordered and reviewed (Electrolytes were reviewed with the patient only glucose is elevated.  At 122 all other electrolytes were within normal limits.)        Initial Evaluation:  ED First Provider Contact     Date/Time Event User Comments    07/20/14 1432 ED Provider First Contact EARLY, KRISTINE Initial Face to Face Provider Contact          Patient seen by me as above July 20, 2014 and 7 PM    Assessment:  39 y.o., female comes to the ED with episodes of syncope along with headache.  Patient states that over the last 2 days she passed out twice yesterday and once today.  She states that this is an ongoing issue for the last 2-3 months.  She was seen by neurology and has a follow-up appointment tomorrow to review her MRI.  She currently states she has mild nausea along with a headache.      Differential Diagnosis includes   Syncope  Seizures  Head injury  Headache  Nausea                Plan: An IV was started along with IV fluids.  A plasma profile and a right upper quadrant panel was drawn.  An EKG was done showed normal sinus rhythm.  Electrolytes are within normal limits except for glucose which was 122.  A dose of naproxen was given for pain/headache.  Patient states that the pain in her head did not decrease significantly. she was requesting specifically Dilaudid for the headache.  I discussed with her that dilaudid can cause rebound headaches and that this is not an recommended treatment for  headaches.. Patient was advised that she will be discharged home. she should rest for this evening and follow-up with her primary neurologist tomorrow to discuss the headaches/ seizure activity and the syncope.    Jennette Dubin, NP    Supervising physician Dr Ginette Otto was immediately available          Jennette Dubin, NP  07/20/14 2112    Jennette Dubin, NP  07/20/14 2123

## 2014-07-20 NOTE — ED Notes (Signed)
MKert response / had pseudoseizure at group and bumped her head no loc / two weeks ago same thing happened at the same spot

## 2014-07-20 NOTE — ED Notes (Signed)
Ready for discharge.  Iv removed   Will folow up with her provider.   States she walks with walker.  SAssisted to wheelchair and to car in wheelchair.

## 2014-07-21 ENCOUNTER — Telehealth: Payer: Self-pay

## 2014-07-21 NOTE — Telephone Encounter (Signed)
Pt called to make a same day appointment today for stabbing abdominal pain and was offered 1:30pm but pt stated she is not able to come in at that time as she has an appointment in [place] at 2pm. Pt asked if there were any appointments available for tomorrow to schedule and writer explained that there are only same day appointments and pt stated that she is sleeping in the morning and would not be able to cal in the morning for a same day appointment. Pt can be reached at [phone] to advise pt what she should do.

## 2014-07-21 NOTE — Telephone Encounter (Signed)
Call to pt for [facility] ED F/U, no answer, left message to call the office for F/U

## 2014-07-21 NOTE — Telephone Encounter (Signed)
Pt states she was at ED one day ago and when she got home she began to experience abd pain.  Pt denies fever, bloody/black stool, N/V, decreased appetite.  Home care instructions reviewed.  Pt to call office for appt is not improvement or worsening sx.

## 2014-07-22 ENCOUNTER — Ambulatory Visit: Payer: Self-pay

## 2014-07-23 ENCOUNTER — Encounter: Payer: Self-pay | Admitting: Clinical

## 2014-07-23 NOTE — Progress Notes (Signed)
Clinical Management Note     Kaylee Harris attended one Emotion Regulation group session on Oct. 7, 2015, and two Interpersonal Effectiveness group sessions, Dec. 9 and Dec. 16, 2015. She has not returned to group and is no longer in group.

## 2014-07-25 ENCOUNTER — Telehealth: Payer: Self-pay

## 2014-07-25 NOTE — Telephone Encounter (Signed)
Call to pt for f/u from ED visit and lap appy that she had done end of last week. Pt was sleeping, she will call back later today when she is more awake to schedule follow up

## 2014-07-25 NOTE — Telephone Encounter (Signed)
Patient is scheduled to see Mickel Baas on Wednesday.

## 2014-07-26 ENCOUNTER — Other Ambulatory Visit: Payer: Self-pay | Admitting: Primary Care

## 2014-07-27 ENCOUNTER — Encounter: Payer: Self-pay | Admitting: Gastroenterology

## 2014-07-27 ENCOUNTER — Encounter: Payer: Self-pay | Admitting: Family Medicine

## 2014-07-27 ENCOUNTER — Ambulatory Visit: Payer: Self-pay | Admitting: Family Medicine

## 2014-07-27 VITALS — BP 120/84 | HR 97 | Temp 98.7°F | Ht 68.0 in | Wt 300.2 lb

## 2014-07-27 DIAGNOSIS — R109 Unspecified abdominal pain: Secondary | ICD-10-CM

## 2014-07-27 DIAGNOSIS — Z9889 Other specified postprocedural states: Secondary | ICD-10-CM

## 2014-07-27 NOTE — Progress Notes (Signed)
[facility] MISSED/CANCELLED APPOINTMENT     Name: Kaylee Harris  MRN: [id number]   DOB: 03-02-1976    Date of Scheduled Service: 07/22/14    Ms. Large cancelled today's appointment with greater than 24 hours notice.  I have Writer received a voice mail from patient's wife stating that patient had an emergency appentectomy.    Additional Information:    Writer left a voice mail for patient requesting a call back to confirm whether or not she would be able to keep her next scheduled appointment on 08/03/14.

## 2014-07-27 NOTE — Progress Notes (Signed)
Subjective:     Patient ID: Kaylee Harris is a 39 y.o. female.    HPI  Here with follow up recently hospitalization at [facility] for acute appendicitis; had lap appy done 07/22/2014.   Feeling somewhat sore, but moving. Oxycodone for pain; initially every 4 hours now using about 2 times a day.   No acetaminophen or ibuprofen.: uses naproxen am and pm.   NO fevers or chills.   No nausea or vomiting. Eating; grilled cheese, scrambled eggs, chicken and rice soup.   Drinking water.  BM: yesterday; after 4 days with no BM: took 2 laxative type pills and miralax.   Plans to return to group next week; too sore this week to sit get to group and sit through group.   Seen earlier by surgeon who says that she is progressing appropriately     Patient's medications, allergies, past medical, surgical, social and family histories were reviewed and updated     Review of Systems  Per above.     Objective:   Physical Exam  BP 120/84 mmHg   Pulse 97   Temp(Src) 37.1 C (98.7 F) (Temporal)   Ht 1.727 m (5\' 8" )   Wt 136.17 kg (300 lb 3.2 oz)   BMI 45.66 kg/m2  General appearance: alert, well appearing, and in no distress.  SKIN: warm, dry, good turgor. No rashes or lesions in visible areas  EYE: PERRLA, conjunctiva clear, lids normal  NECK:trachea midline, no masses, thyroid symmetrical with no lesion or nodules appreciated.  LYMPH: no cervical or supraclavicular LAD appreciated  RESPIRATORY: unlabored respiratory effort, lungs clear to auscultation, no wheezes, rales or rhonchi, symmetrical air entry.   CVS exam: normal rate, regular rhythm, normal S1, S2, no murmurs, rubs, clicks or gallops. No edema  Abdominal exam:surgical incision with steri strips, no redness swelling or discharge. Mild crusting to lower mid line incision.  + bowel sounds x 4 quads, soft, + tender  Through out, nondistended, no masses or hepato-splenomegaly. No guarding or rebound tenderness  Neurological: Alert and oriented x3, steady gait  Psych: alert and  oriented x3, normal affect and mood, good eye contact, neatly dressed and groomed. Logical flow of thoughts and conversation.    Assessment/Plan:  Post op; laparoscopic appendectomy; tolerated procedure well. Tolerating regular diet. Continue current regime. F/u withPCP as scheduled.

## 2014-07-30 ENCOUNTER — Other Ambulatory Visit: Payer: Self-pay | Admitting: Primary Care

## 2014-08-02 ENCOUNTER — Telehealth: Payer: Self-pay

## 2014-08-02 ENCOUNTER — Encounter: Payer: Self-pay | Admitting: Gastroenterology

## 2014-08-02 NOTE — Telephone Encounter (Addendum)
Patient fell out of the shower and bumped her arm and "blacked out", patient seems to be fine now, please advise and call back [phone]    We DO NOT have any appointments left for the day

## 2014-08-02 NOTE — Telephone Encounter (Signed)
When pt asked how she got out of the bathroom she stated it was very difficult.  C/o pain in hip and shoulder asking if either could be broken.  With questioning pt states she is currently in the car heading to [facility], states she walked to the car using her walker.  Pt advised if she walked her hip is not broken and if she used her walker her shoulder is not broken.  Pt denies numbness/tingling in the affected arm states she can still use the arm but it hurts and is black and blue and swollen.  Pt advised she could treat her sx with tyl and/or ibuprofen, cold pack and rest and if no improvement after treating for a few days or worsening sx to call the office for appt. Pt then asks about her head stating she hit that also.  Denies bleeding or swelling.  Pt advised as she is speaking clearly, no evidence of confusion she could monitor her head also.  Pt states she is almost to [facility] so will go and make sure all is ok.

## 2014-08-03 ENCOUNTER — Ambulatory Visit: Payer: Self-pay

## 2014-08-03 ENCOUNTER — Encounter: Payer: Self-pay | Admitting: Gastroenterology

## 2014-08-03 ENCOUNTER — Telehealth: Payer: Self-pay

## 2014-08-03 NOTE — Telephone Encounter (Signed)
Patient is currently in [facility], she passed out 3 times yesterday.   She will call to reschedule with Vaughan Basta when she is better.   Wanted to let Cecille Rubin know she will miss group tinight.

## 2014-08-03 NOTE — Telephone Encounter (Signed)
A vm was left for patient to call back and reschedule follow up, due to provider being sick and apt being bumped

## 2014-08-07 ENCOUNTER — Other Ambulatory Visit: Payer: Self-pay | Admitting: Psychiatry

## 2014-08-08 ENCOUNTER — Telehealth: Payer: Self-pay

## 2014-08-08 ENCOUNTER — Other Ambulatory Visit: Payer: Self-pay

## 2014-08-08 MED ORDER — ZOLPIDEM TARTRATE 6.25 MG PO TBCR *A*
6.2500 mg | ORAL_TABLET | Freq: Every evening | ORAL | Status: DC | PRN
Start: 2014-08-08 — End: 2014-10-12

## 2014-08-08 MED ORDER — DULOXETINE HCL 60 MG PO CPEP *I*
60.0000 mg | DELAYED_RELEASE_CAPSULE | Freq: Every day | ORAL | Status: DC
Start: 2014-08-08 — End: 2014-11-02

## 2014-08-08 NOTE — Telephone Encounter (Signed)
Called to pt for [facility] ED f/u, no answer, left name and number for call back

## 2014-08-09 NOTE — Telephone Encounter (Signed)
2nd call placed to pt for [facility] ED F/U, no answer, will send My Chart Message

## 2014-08-09 NOTE — Telephone Encounter (Signed)
Pt returned call and stated that she does not need to come in for an appointment, stated she feels fine and that she is coming in any way on 08/25/14

## 2014-08-10 ENCOUNTER — Ambulatory Visit: Payer: Self-pay

## 2014-08-12 ENCOUNTER — Encounter: Payer: Self-pay | Admitting: Gastroenterology

## 2014-08-12 ENCOUNTER — Telehealth: Payer: Self-pay | Admitting: Primary Care

## 2014-08-12 NOTE — Telephone Encounter (Addendum)
[facility]  After Hours / On-Call Note    Date of Call: 08/12/2014  Time of Call: 8:22 PM    PCP:  Laurance Flatten    Caller:  Dr Grandville Silos, [place]    Problem:    Mult syncopal episodes, being admitted for observation. Loop recorder in place. CT done.     Disposition/Plan:   Noted.       The patient/parent is instructed to call back if patient is having worsening symptoms or lack of improvement of current symptoms with the above treatment     Pilar Plate, MD   Family Medicine

## 2014-08-15 ENCOUNTER — Encounter: Payer: Self-pay | Admitting: Psychiatry

## 2014-08-15 ENCOUNTER — Ambulatory Visit: Payer: Self-pay | Admitting: Psychiatry

## 2014-08-18 ENCOUNTER — Ambulatory Visit: Payer: Self-pay | Admitting: Primary Care

## 2014-08-22 ENCOUNTER — Other Ambulatory Visit: Payer: Self-pay | Admitting: Psychiatry

## 2014-08-22 ENCOUNTER — Encounter: Payer: Self-pay | Admitting: Gastroenterology

## 2014-08-22 ENCOUNTER — Other Ambulatory Visit: Payer: Self-pay | Admitting: Primary Care

## 2014-08-22 ENCOUNTER — Other Ambulatory Visit: Payer: Self-pay | Admitting: Neurology

## 2014-08-22 MED ORDER — MELATONIN 5 MG PO CAPS
10.0000 mg | ORAL_CAPSULE | Freq: Every evening | ORAL | Status: DC
Start: 2014-08-22 — End: 2014-11-08

## 2014-08-22 MED ORDER — FREESTYLE LITE TEST VI STRP *A*
ORAL_STRIP | Status: DC
Start: 2014-08-22 — End: 2014-10-31

## 2014-08-23 ENCOUNTER — Other Ambulatory Visit: Payer: Self-pay | Admitting: Primary Care

## 2014-08-23 ENCOUNTER — Other Ambulatory Visit: Payer: Self-pay | Admitting: Neurology

## 2014-08-23 ENCOUNTER — Other Ambulatory Visit: Payer: Self-pay | Admitting: Psychiatry

## 2014-08-23 MED ORDER — ALCOHOL WIPES PADS
MEDICATED_PAD | Status: DC
Start: 2014-08-23 — End: 2014-10-31

## 2014-08-23 MED ORDER — MIRTAZAPINE 7.5 MG PO TABS *A*
7.5000 mg | ORAL_TABLET | Freq: Every evening | ORAL | Status: DC
Start: 2014-08-23 — End: 2014-08-31

## 2014-08-23 MED ORDER — LURASIDONE HCL 80 MG PO TABS *I*
80.0000 mg | ORAL_TABLET | Freq: Every day | ORAL | Status: DC
Start: 2014-08-23 — End: 2014-10-04

## 2014-08-24 ENCOUNTER — Other Ambulatory Visit: Payer: Self-pay | Admitting: Psychiatry

## 2014-08-24 MED ORDER — CLONAZEPAM 0.5 MG PO TABS *I*
0.5000 mg | ORAL_TABLET | Freq: Every day | ORAL | Status: DC | PRN
Start: 2014-08-24 — End: 2014-08-31

## 2014-08-24 NOTE — Telephone Encounter (Signed)
Received repeated refill requests from pt who is being followed at [facility].    Spoke to pt and let her know that [facility] would now have to fill any prescriptions related to symptom management of her M.S.    Pt acknowledged understanding.

## 2014-08-25 ENCOUNTER — Other Ambulatory Visit: Payer: Self-pay | Admitting: Vascular Surgery

## 2014-08-25 ENCOUNTER — Ambulatory Visit: Payer: Self-pay | Admitting: Primary Care

## 2014-08-25 ENCOUNTER — Encounter: Payer: Self-pay | Admitting: Primary Care

## 2014-08-25 VITALS — BP 118/92 | HR 99 | Temp 99.2°F | Resp 16 | Ht 68.0 in | Wt 291.0 lb

## 2014-08-25 DIAGNOSIS — IMO0002 Reserved for concepts with insufficient information to code with codable children: Secondary | ICD-10-CM

## 2014-08-25 DIAGNOSIS — F332 Major depressive disorder, recurrent severe without psychotic features: Secondary | ICD-10-CM

## 2014-08-25 DIAGNOSIS — F488 Other specified nonpsychotic mental disorders: Secondary | ICD-10-CM

## 2014-08-25 DIAGNOSIS — F603 Borderline personality disorder: Secondary | ICD-10-CM

## 2014-08-25 DIAGNOSIS — R55 Syncope and collapse: Secondary | ICD-10-CM

## 2014-08-25 NOTE — Progress Notes (Signed)
[facility]    SUBJECTIVE    Pt is here to discuss:    Chief Complaint   Patient presents with    Headache     syncope about 3:30pm hit forehead on tub     Neck Pain     1. 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 Syncopal episodes seem to be increasing in severity and frequency. Has been in ED frequently over the last month.  Tells me that she did start journaling after the episodes, mainly focusing on what was going on at the time, where she was, what activity she was doing.  She tells me "it is always different", but then also describes that she figured out she was moving too quickly while in the bathroom, turning her head too much, and has since made some changes to reduce this. Not sure if it's helped reduce syncopal episodes however.  She also states that her wife now accompanies her into the bathroom, to watch her and reduce the risk of falling in the small bathroom.  Bunnie is not fan of this, but also voices that she understands why [name] does this.    Also planning on going to be see Dr Frances Furbish (vascular), planning carotid US.         PMH / Family Hx / Social Hx  Patient's medications, allergies, problem list, past medical, social histories were reviewed and notable for:    Current Outpatient Prescriptions   Medication Sig Note    clonazePAM (KLONOPIN) 0.5 MG tablet Take 1 tablet (0.5 mg total) by mouth daily as needed for Anxiety   Max daily dose: 0.5 mg (Patient taking differently: Take 0.5 mg by mouth 2 times daily   )     mirtazapine (REMERON) 7.5 MG tablet Take 1 tablet (7.5 mg total) by mouth nightly     lurasidone (LATUDA) 80 MG tablet Take 1 tablet (80 mg total) by mouth daily     Alcohol Swabs (ALCOHOL WIPES) PADS Use BID for BG check     Melatonin 5 MG CAPS Take 10 mg by mouth nightly     FREESTYLE LITE test strip Use BID as directed for 250.02     DULoxetine (CYMBALTA) 60 MG capsule Take 1 capsule (60 mg total) by mouth  daily     zolpidem (AMBIEN CR) 6.25 MG CR tablet Take 1 tablet (6.25 mg total) by mouth nightly as needed for Sleep   Max daily dose: 6.25 mg Swallow whole. Do not crush, break, or chew.     naproxen sodium (ANAPROX) 550 MG tablet TAKE 1 TABLET (550 MG TOTAL) BY MOUTH 2 TIMES DAILY (WITH MEALS)     fluticasone (FLONASE) 50 MCG/ACT nasal spray 1 SPRAY BY NASAL ROUTE DAILY     busPIRone (BUSPAR) 30 MG tablet Take 1 tablet (30 mg total) by mouth 2 times daily     omeprazole (PRILOSEC) 20 MG capsule Take every other day for 4 weeks, then every 3 days for 2 weeks, then stop     ranitidine (ZANTAC) 150 MG capsule Take 1 capsule (150 mg total) by mouth 2 times daily     benztropine (COGENTIN) 1 MG tablet Take 1 tablet (1 mg total) by mouth 2 times daily     insulin glargine (LANTUS SOLOSTAR) 100 UNIT/ML injection pen Inject 28 Units into the skin nightly (Patient taking differently: Inject 30 Units into the skin nightly   )     topiramate (TOPAMAX)  100 MG tablet Take 100 mg by mouth 2 times daily    06/20/2014: Received from: External Pharmacy    SUMAtriptan Succinate (IMITREX) 6 MG/0.5ML SOLN injection Inject 6 mg into the skin ONCE PRN for Headaches     insulin pen needle (NOVOFINE) 30G X 8 MM Use 1 times a day as instructed.     lancets Brand Free Style Lite; Use 2 times per day as directed for blood glucose testing.     levonorgestrel (MIRENA) 20 MCG/24HR IUD 1 each by Intrauterine route once     VENTOLIN HFA 108 (90 BASE) MCG/ACT inhaler Inhale 1-2 puffs into the lungs every 4-6 hours as needed for Wheezing     SUMAtriptan (IMITREX) 50 MG tablet Take 1 tablet (50 mg total) by mouth as needed for Migraine   Take at onset of headache. May repeat once in 2 hours.     cyclobenzaprine (FLEXERIL) 5 MG tablet TAKE 1 TABLET (5 MG TOTAL) BY MOUTH 3 TIMES DAILY AS NEEDED FOR MUSCLE SPASMS     dicyclomine (BENTYL) 20 MG tablet  08/18/2013: Received from: External Pharmacy    famciclovir (FAMVIR) 500 MG tablet  Take 1 tablet (500 mg total) by mouth 3 times daily as needed     blood glucose monitor system Brand: cheapest brand available per her insurance.  Use as directed.     Non-System Medication The above patient is followed in our clinic and cannot resume work permanently.     ondansetron (ZOFRAN) 8 MG tablet Take 1 tablet (8 mg total) by mouth 3 times daily as needed for Nausea        OBJECTIVE  Filed Vitals:    08/25/14 1645   BP: 118/92   Pulse: 99   Temp: 37.3 C (99.2 F)   Resp: 16   Height: 1.727 m (5\' 8" )   Weight: 131.997 kg (291 lb)   SpO2: 98%     Body mass index is 44.26 kg/(m^2).      General: Patient alone in room today (wife in waiting room). Initially found slumped over on the desk when I walked in.  Awoken easily by turning her head. Did not show any diffuse hypotonia and eyes fluttered open with repeated vocal stimulation. She was immediately AAO, without postictal confusion or movement disorders. I made a specific effort to react calmly and not ask if she syncopized until end of encounter. For the remainder of the visit she appeared pleasant & conversant, in NAD  Eyes:. PERRLA. EOMI. Conjunctiva pink, without swelling or exudate. Lids normal.   ENT: Moist mucous membranes. No obvious bruising or lacerations to forehead or face.  Psych: AAOx3, depressed and blunted affect and mood. Insight and judgement ok.           ASSESSMENT & PLAN  1. Psychogenic syncope  2. Major depressive disorder, recurrent, severe without psychotic features  3. Borderline personality disorder  He spent today discussing openly that I suspect her EEG will be negative, and that if it is we will continue to address these episodes with behavioral modifications.  Encouraged she now focus her journaling on her emotions and types of food that she is eating around these episodes.  At the end of the visit, I, asked if she felt that she had BP while waiting in our office, to which she replied yes.  I stated that it did not appear it  was a seizure, and that she can followup tomorrow with her neurologist as planned.  We  will continue her monthly visits. We also decided to make a goal of planning a pleasant activity this summer, to "break the cycle" of her current schedule and feeling stuck in her home.      4. DM (diabetes mellitus), type 2, uncontrolled  - Microalbumin, Urine, Random; Future        Follow-up: monthly      [name], MD  [facility]  08/25/2014  5:08 PM        ______________________

## 2014-08-26 ENCOUNTER — Telehealth: Payer: Self-pay | Admitting: Primary Care

## 2014-08-26 NOTE — Telephone Encounter (Signed)
Rec'd ED report from last night that describes pt being seen at 5:37pm with CC "went into PCP's office and passed out there. PCP sent pt in to ED d/t slurred speech..."    Called pt to discuss the fact that I did not recommend this, nor react (purposefully) as I suspect her "syncopal" episode in my office was psychogenic. I specifically stated at the end of the encounter this was NOT seizure activity and that she should f/u on Friday with Neuro for the extended EEG monitoring. No one picked up the phone but i will be calling back again Monday to discuss this both with the pt and her partner [name], as i think this is proof that she is potentially self inducing these seizures or even exhibiting some signs of factitious d/o.    [name], MD  [facility]  08/26/2014  11:34 AM

## 2014-08-29 NOTE — Telephone Encounter (Signed)
Called and spoke with pt. Wife was not present at time of phone call. She tells me she and wife decided to go to the hospital on the way home from clinic due to slurring of her words when she got into the car. She tells me they did a neuro assessment, bloodwork, but "I didn't let them do a CT scan".    When I asked what she told the ED drs, she says " that I was at your office and that I was slurring my words".    I specifically brought up the fact that they stated she was slurring her speech in my office and I referred her to ED, which was not the case as she was not slurring her speech during our encounter. She responded "well they must have misunderstood me."    i reviewed with her that in the future, if she is evaluated by me or any provider, she should have confidence in that evaluation and find that reason not to go to ED. I spoke with her on my goal of reducing ED frequency. She responded that she is now having Shari check her grip strength after her episodes, and that the only time they have gone is when she syncopized in [employer] and when she was out for 37min. I reviewed her chart and specifically stated we have records of ED/UC visits on 3/29 (first UC, then ED), 4/2, 4/8 (the [employer] episode), 4/18 (the prolonged episode of 27min obtundation). We also discussed possibly having her call our office first prior to deciding to go to ED. We discussed the goal of this is to reduce the time she is spent in the ED, risk of overtreatment and iatrogenic sequelae of procedures/work up, and get her more comfortable with staying home and managing her symptoms and frequency herself.    She just recently had her EEG done this weekend and tells me she had an episode while it was recording. Awaiting results. I asked that she f/u with me via mychart when she hears about these results, and for now we will observe her ED frequency for the next 2 weeks. May initiate the call to our office at that time if persistent frequent  visits.    [name], MD  [facility]  08/29/2014  5:31 PM

## 2014-08-30 ENCOUNTER — Other Ambulatory Visit: Payer: Self-pay | Admitting: Psychiatry

## 2014-08-30 MED ORDER — BENZTROPINE MESYLATE 1 MG PO TABS *I*
1.0000 mg | ORAL_TABLET | Freq: Two times a day (BID) | ORAL | Status: DC
Start: 2014-08-30 — End: 2014-11-02

## 2014-08-31 ENCOUNTER — Ambulatory Visit: Admit: 2014-08-31 | Discharge: 2014-08-31 | Disposition: A | Discharge status: Home / Self Care | Payer: Self-pay

## 2014-08-31 ENCOUNTER — Encounter: Payer: Self-pay | Admitting: Vascular Surgery

## 2014-08-31 ENCOUNTER — Ambulatory Visit: Payer: Self-pay | Admitting: Vascular Surgery

## 2014-08-31 VITALS — BP 104/80 | Ht 68.0 in | Wt 291.0 lb

## 2014-08-31 DIAGNOSIS — R55 Syncope and collapse: Secondary | ICD-10-CM

## 2014-08-31 LAB — CV US CAROTID BILATERAL
Left Carotid Bulb EDV: -18.3 cm/s
Left Carotid Bulb PSV: -55.3 cm/s
Left Common Carotid Artery EDV Dist: -20.6 cm/s
Left Common Carotid Artery EDV Prox: 17 cm/s
Left Common Carotid Artery PSV Dist: -79.1 cm/s
Left Common Carotid Artery PSV Prox: 99.7 cm/s
Left External Carotid Artery EDV: -12.5 cm/s
Left External Carotid Artery PSV: -64.7 cm/s
Left Internal Carotid Artery EDV Dist: -27.7 cm/s
Left Internal Carotid Artery EDV Mid: -24.5 cm/s
Left Internal Carotid Artery EDV Prox: 10.4 cm/s
Left Internal Carotid Artery PSV Dist: -70.4 cm/s
Left Internal Carotid Artery PSV Mid: -66.3 cm/s
Left Internal Carotid Artery PSV Prox: 49.5 cm/s
Left Subclavian Artery PSV: 150 cm/s
Left Vertebral Artery EDV: -20.1 cm/s
Left Vertebral Artery PSV: -50.9 cm/s
Right Carotid Bulb EDV: -19.8 cm/s
Right Carotid Bulb PSV: -56.4 cm/s
Right Common Carotid Artery EDV Dist: -24.1 cm/s
Right Common Carotid Artery EDV Prox: 17.9 cm/s
Right Common Carotid Artery PSV Dist: -82.5 cm/s
Right Common Carotid Artery PSV Prox: 88.7 cm/s
Right External Carotid Artery EDV: -21.6 cm/s
Right External Carotid Artery PSV: -108 cm/s
Right Internal Carotid Artery EDV Dist: -25.9 cm/s
Right Internal Carotid Artery EDV Mid: -28.7 cm/s
Right Internal Carotid Artery EDV Prox: -23.2 cm/s
Right Internal Carotid Artery PSV Dist: -61.9 cm/s
Right Internal Carotid Artery PSV Mid: -71 cm/s
Right Internal Carotid Artery PSV Prox: -56 cm/s
Right Subclavian Artery PSV: 91.4 cm/s
Right Vertebral Artery EDV: -10 cm/s
Right Vertebral Artery PSV: -38.4 cm/s

## 2014-08-31 NOTE — Progress Notes (Signed)
Kaylee Harris is a pleasant 39 y.o. female with PMH significant for HLD, DM, depression, anxiety, fibromyalgia, and migraine, who is referred to the Vascular Surgery outpatient clinic for evaluation of syncope. Patient reports previous evaluation of symptoms with Cardiology, Primary Care, and Neurology. She presents to today's office visit with her wife.    Kessler reports history of recurrent syncopal episodes occurring since 2008. Symptoms are reportedly increasing in frequency and duration over the course of time, now occurring up to several times a week. She denies any known provoking factors and reports that she can be sitting, standing, walking, or rising from seated position when LOC will occur. She notes occasional warning with "head rush" and dizziness prior to episode, but other times syncope can occur without warning. Following episodes, patient reports feeling drowsy and needing to "take a nap." She reports occasional loss of bladder control with LOC. She denies any symptoms concerning for stroke, TIA, or amaurosis fugax. Symptoms do not consistently occur with arm overhead use. Patient additionally reports concern today regarding generalized feeling of always being cold and cool sensation of the hands and feet. She denies any symptoms consistent with claudication, rest pain, or history of non-healing ulceration of the extremities.    Of note, patient completed 72 hour EEG monitoring on Monday of this week. She has not yet been seen for follow-up with Neurology for review of these results. Patient reports that she did experience syncopal episode during monitoring period.      Patient's medications, allergies, and medical, surgical, family, and social histories were updated, as appropriate, in eRecord during today's office visit.    Review of Systems - Pertinent items noted in HPI.    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 Lower abdomen with cellulitis changes, wound probed and purulent material expressed.  Had PICC line for "multiple infiltrations" of what? D/c-ed home on 10d of Augmentin 9/11.      Depression     Anxiety     Neuromuscular disorder     Fibromyalgia     Migraine      Past Surgical History   Procedure Laterality Date    Dilation and curettage of uterus  2005, 2007     x2 for menorraghia    Tonsillectomy      Cardiac catheterization  09/2011     negative    Loop recorder  Feb 03 2014    Arthroscopic shoulder surgery Right 2010     Related to lifting    Left colectomy  01/03/14     Dr Tresa Res    Small intestine surgery      Appendectomy  2016     Family History   Problem Relation Age of Onset    Hypertension Father     Diabetes Father     Kidney disease Father     Elevated lipids Father  Heart attack Father 90    Other Father      PVD    Hypertension Mother     Elevated lipids Mother     Heart attack Mother 3    Diabetes Mother     Eczema Mother     Psoriasis Mother     Hypertension Brother     Heart disease Brother 3     prinzmetal's angina    Heart disease Sister      currently having work up    [place] Sister     Hypertension Sister     Breast cancer Maternal Grandmother     Stroke       History   Substance Use Topics    Smoking status: Former Smoker -- 0.50 packs/day for 3 years     Types: Cigarettes    Smokeless tobacco: Never Used    Alcohol Use: 0.0 oz/week     0 Not specified per week      Comment: <1 weekly     Allergies   Allergen Reactions    Morphine Anaphylaxis    Toradol [Ketorolac Tromethamine] Anaphylaxis      Notes feeling tightness in her throat and full body itching after last dose a month ago. Also notes erythema tracking along her vein after injection. This is not reflected by documentation.  Tolerates Naproxen.    Trazodone Anaphylaxis     Current Outpatient Prescriptions   Medication Sig    cetirizine (ZYRTEC) 10 MG tablet Take 10 mg by mouth daily    diazepam (VALIUM) 5 MG tablet Take 5 mg by mouth 2 times daily as needed for Anxiety    benztropine (COGENTIN) 1 MG tablet Take 1 tablet (1 mg total) by mouth 2 times daily    lurasidone (LATUDA) 80 MG tablet Take 1 tablet (80 mg total) by mouth daily    Alcohol Swabs (ALCOHOL WIPES) PADS Use BID for BG check    Melatonin 5 MG CAPS Take 10 mg by mouth nightly    FREESTYLE LITE test strip Use BID as directed for 250.02    DULoxetine (CYMBALTA) 60 MG capsule Take 1 capsule (60 mg total) by mouth daily    zolpidem (AMBIEN CR) 6.25 MG CR tablet Take 1 tablet (6.25 mg total) by mouth nightly as needed for Sleep   Max daily dose: 6.25 mg Swallow whole. Do not crush, break, or chew.    naproxen sodium (ANAPROX) 550 MG tablet TAKE 1 TABLET (550 MG TOTAL) BY MOUTH 2 TIMES DAILY (WITH MEALS)    fluticasone (FLONASE) 50 MCG/ACT nasal spray 1 SPRAY BY NASAL ROUTE DAILY    busPIRone (BUSPAR) 30 MG tablet Take 1 tablet (30 mg total) by mouth 2 times daily    ranitidine (ZANTAC) 150 MG capsule Take 1 capsule (150 mg total) by mouth 2 times daily    insulin glargine (LANTUS SOLOSTAR) 100 UNIT/ML injection pen Inject 28 Units into the skin nightly (Patient taking differently: Inject 30 Units into the skin nightly   )    topiramate (TOPAMAX) 100 MG tablet Take 50 mg by mouth 2 times daily       SUMAtriptan Succinate (IMITREX) 6 MG/0.5ML SOLN injection Inject 6 mg into the skin ONCE PRN for Headaches    insulin pen needle (NOVOFINE) 30G X 8 MM Use 1 times a day as instructed.    lancets Brand Free Style Lite; Use 2 times per day as directed for blood glucose testing.     levonorgestrel (  MIRENA) 20 MCG/24HR IUD 1 each by Intrauterine route once    VENTOLIN HFA 108 (90 BASE) MCG/ACT inhaler Inhale 1-2 puffs into the lungs every 4-6 hours as needed for Wheezing    SUMAtriptan (IMITREX) 50 MG tablet Take 1 tablet (50 mg total) by mouth as needed for Migraine   Take at onset of headache. May repeat once in 2 hours.    cyclobenzaprine (FLEXERIL) 5 MG tablet TAKE 1 TABLET (5 MG TOTAL) BY MOUTH 3 TIMES DAILY AS NEEDED FOR MUSCLE SPASMS    dicyclomine (BENTYL) 20 MG tablet 20 mg 4 times daily (before meals and nightly)       famciclovir (FAMVIR) 500 MG tablet Take 1 tablet (500 mg total) by mouth 3 times daily as needed    blood glucose monitor system Brand: cheapest brand available per her insurance.  Use as directed.    Non-System Medication The above patient is followed in our clinic and cannot resume work permanently.    ondansetron (ZOFRAN) 8 MG tablet Take 1 tablet (8 mg total) by mouth 3 times daily as needed for Nausea       Physical Exam:  BP 104/80 mmHg   Ht 1.727 m (5\' 8" )   Wt 131.997 kg (291 lb)   BMI 44.26 kg/m2    General Appearance:    Alert, cooperative, no distress, appears stated age.   Head:    Normocephalic, without obvious abnormality, atraumatic.   Neck:   Supple, symmetrical; no carotid bruit.   Lungs:     Clear to auscultation bilaterally, respirations unlabored.    Heart:    Regular rate and rhythm.   Extremities:   Extremities normal, atraumatic, no cyanosis or edema.   Pulses:    Right Radial: Normal  Left Radial: Normal  Right Ulnar: Normal  Left Ulnar: Normal  Right Dorsalis Pedis: Normal  Left Dorsalis Pedis: Normal  Right Posterior Tibial: Normal  Left Posterior Tibial: Normal    Skin:   Skin color, texture, turgor normal, no rashes or lesions.   Neurologic:   Grossly intact, normal strength and sensation throughout.  No syncope or pre-syncope provoked with upper extremity stress.     Non-invasive studies:   1. No significant stenosis of the  bilateral internal carotid arteries (1-15%).   2. Antegrade bilateral vertebral artery flow.   3. There are no previous exams for comparison.     Assessment: 39 y.o. female with recurrent syncope. No evidence of carotid artery stenosis, no vertebral steal.    Plan:    The results of the studies were discussed with the patient.   Advised no cause of syncope identified on imaging completed today.   As patient experienced syncope during recent EEG monitoring, suspect that this will shed some light regarding any possibility that episodes are actually seizure activity. Patient will follow-up with Neurology in the near future.   Patient reports previous tilt-table testing, likely that further evaluation with ENT would be unrevealing at this time.   Follow-up Vascular Clinic as needed.   Phone clinic with any future questions/concerns.    Patient and case reviewed and plan formulated with Dr. Tsosie Billing.    Tacey Heap, NP 08/31/2014

## 2014-09-05 ENCOUNTER — Encounter: Payer: Self-pay | Admitting: Gastroenterology

## 2014-09-07 ENCOUNTER — Encounter: Payer: Self-pay | Admitting: Psychiatry

## 2014-09-07 ENCOUNTER — Ambulatory Visit: Payer: Self-pay | Admitting: Psychiatry

## 2014-09-07 VITALS — BP 135/84 | HR 100 | Ht 68.11 in | Wt 294.0 lb

## 2014-09-07 DIAGNOSIS — F339 Major depressive disorder, recurrent, unspecified: Secondary | ICD-10-CM

## 2014-09-07 DIAGNOSIS — F429 Obsessive-compulsive disorder, unspecified: Secondary | ICD-10-CM

## 2014-09-07 DIAGNOSIS — F603 Borderline personality disorder: Secondary | ICD-10-CM

## 2014-09-07 MED ORDER — BUSPIRONE HCL 30 MG PO TABS *I*
30.0000 mg | ORAL_TABLET | Freq: Two times a day (BID) | ORAL | Status: DC
Start: 2014-09-07 — End: 2014-11-24

## 2014-09-07 NOTE — Progress Notes (Addendum)
Behavioral Health Psychopharmacology Follow-up     Length of Session: 20 minutes.    Diagnosis Addressed    ICD-10-CM ICD-9-CM   1. MDD (major depressive disorder), recurrent episode F33.9 296.30   2. Borderline personality disorder F60.3 301.83   3. OCD (obsessive compulsive disorder) F42 300.3       Recent History and Response to Medications  Patient states:   HPI     Follow-up    Additional comments: patient here for medication evaluation             Current use of alcohol or drugs: No        Neurovegetative Symptoms Review:  Energy level: poor  Concentration: poor  Sleep Quality: good   Number of hours : 9  Appetite: good     Wt Readings from Last 3 Encounters:   09/07/14 133.358 kg (294 lb)   08/31/14 131.997 kg (291 lb)   08/25/14 131.997 kg (291 lb)     Enjoyment/interest: poor        Current Medications  Current Outpatient Prescriptions   Medication Sig    clonazePAM (KLONOPIN) 0.5 MG tablet 0.5 mg 2 times daily       omeprazole (PRILOSEC) 20 MG capsule     cetirizine (ZYRTEC) 10 MG tablet Take 10 mg by mouth daily    benztropine (COGENTIN) 1 MG tablet Take 1 tablet (1 mg total) by mouth 2 times daily    lurasidone (LATUDA) 80 MG tablet Take 1 tablet (80 mg total) by mouth daily    Alcohol Swabs (ALCOHOL WIPES) PADS Use BID for BG check    Melatonin 5 MG CAPS Take 10 mg by mouth nightly    FREESTYLE LITE test strip Use BID as directed for 250.02    DULoxetine (CYMBALTA) 60 MG capsule Take 1 capsule (60 mg total) by mouth daily    zolpidem (AMBIEN CR) 6.25 MG CR tablet Take 1 tablet (6.25 mg total) by mouth nightly as needed for Sleep   Max daily dose: 6.25 mg Swallow whole. Do not crush, break, or chew.    naproxen sodium (ANAPROX) 550 MG tablet TAKE 1 TABLET (550 MG TOTAL) BY MOUTH 2 TIMES DAILY (WITH MEALS)    fluticasone (FLONASE) 50 MCG/ACT nasal spray 1 SPRAY BY NASAL ROUTE DAILY    busPIRone (BUSPAR) 30 MG tablet Take 1 tablet (30 mg total) by mouth 2 times daily    ranitidine (ZANTAC)  150 MG capsule Take 1 capsule (150 mg total) by mouth 2 times daily    insulin glargine (LANTUS SOLOSTAR) 100 UNIT/ML injection pen Inject 28 Units into the skin nightly (Patient taking differently: Inject 30 Units into the skin nightly   )    topiramate (TOPAMAX) 100 MG tablet Take 50 mg by mouth 2 times daily       SUMAtriptan Succinate (IMITREX) 6 MG/0.5ML SOLN injection Inject 6 mg into the skin ONCE PRN for Headaches    insulin pen needle (NOVOFINE) 30G X 8 MM Use 1 times a day as instructed.    lancets Brand Free Style Lite; Use 2 times per day as directed for blood glucose testing.    levonorgestrel (MIRENA) 20 MCG/24HR IUD 1 each by Intrauterine route once    SUMAtriptan (IMITREX) 50 MG tablet Take 1 tablet (50 mg total) by mouth as needed for Migraine   Take at onset of headache. May repeat once in 2 hours.    dicyclomine (BENTYL) 20 MG tablet 20 mg 4 times daily (  before meals and nightly)       blood glucose monitor system Brand: cheapest brand available per her insurance.  Use as directed.    Non-System Medication The above patient is followed in our clinic and cannot resume work permanently.    ondansetron (ZOFRAN) 8 MG tablet Take 1 tablet (8 mg total) by mouth 3 times daily as needed for Nausea    VENTOLIN HFA 108 (90 BASE) MCG/ACT inhaler Inhale 1-2 puffs into the lungs every 4-6 hours as needed for Wheezing    cyclobenzaprine (FLEXERIL) 5 MG tablet TAKE 1 TABLET (5 MG TOTAL) BY MOUTH 3 TIMES DAILY AS NEEDED FOR MUSCLE SPASMS    famciclovir (FAMVIR) 500 MG tablet Take 1 tablet (500 mg total) by mouth 3 times daily as needed     No current facility-administered medications for this visit.       Side Effects  Patient Reported Side Effects: None reported    Mental Status  APPEARANCE: Casual  ATTITUDE TOWARD INTERVIEWER: Cooperative  MOTOR ACTIVITY: WNL (within normal limits)  EYE CONTACT: Direct  SPEECH: Normal rate and tone  AFFECT: Decreased Range and Appropriate  MOOD: Anxious and  Depressed  THOUGHT PROCESS: Normal  THOUGHT CONTENT: No unusual themes  PERCEPTION: Hallucinations Auditory and hearing music still.  ORIENTATION: Alert and Oriented X 3.  CONCENTRATION: Fair  MEMORY:   Recent: intact   Remote: intact  COGNITIVE FUNCTION: Average intelligence  JUDGMENT: Intact  IMPULSE CONTROL: Fair  INSIGHT: Poor    Risk Assessment    Self Injury: Patient Denies  Suicidal Ideation: Patient Denies but still has thoughts that she would be better off dead  Homicidal Ideation: Patient Denies  Aggressive Behavior: Patient Denies    Results  none    BP Readings from Last 3 Encounters:   09/07/14 135/84   08/31/14 104/80   08/25/14 118/92       Assessment  FORMULATION: Patient reports feeling frustrated that there is still not resolution to what is causing her dizziness and she does not want to believe that it is psychogenic.  Her vascular doctor suggested she see an ENT and cardiologist is sending her to someone to evaluate her for POTS.  Even if nothing is found patient says it will take a long time for her to accept that it is psychogenic "because it makes me think I'm crazy.  We discussed that psychogenic does not mean that it is conscious.  She says she believes the increase in Buspar helped her anxiety a little.  She says she still gets anxious around crowds Engineer, production normalized as common with PTSD.  We discussed not making any additional changes to her medications to which she agreed. Patient has not seen her therapist since 2/29.  Writer discussed with her that she needs to make and keep an appointment with her in the next three weeks or she will be discharged from our serviced.  Patient nodded that she understood.    Recommendations/Plan and Rationale  PLAN: Continue current medications.  Return in 8 weeks.

## 2014-09-09 ENCOUNTER — Telehealth: Payer: Self-pay

## 2014-09-09 NOTE — Telephone Encounter (Signed)
Left msg for pt to call office back.

## 2014-09-12 NOTE — Telephone Encounter (Signed)
Pt with f/u appt scheduled 09/19/14

## 2014-09-15 ENCOUNTER — Ambulatory Visit
Admit: 2014-09-15 | Discharge: 2014-09-15 | Disposition: A | Discharge status: Home / Self Care | Payer: Self-pay | Source: Ambulatory Visit | Attending: Primary Care | Admitting: Primary Care

## 2014-09-15 DIAGNOSIS — IMO0002 Reserved for concepts with insufficient information to code with codable children: Secondary | ICD-10-CM

## 2014-09-16 LAB — MICROALBUMIN, URINE, RANDOM
Creatinine,UR: 45 mg/dL (ref 20–300)
Microalbumin,UR: <0.3 mg/dL

## 2014-09-16 LAB — HEMOGLOBIN A1C: Hemoglobin A1C: 7.9 % — ABNORMAL HIGH (ref 4.0–6.0)

## 2014-09-19 ENCOUNTER — Ambulatory Visit: Payer: Self-pay | Admitting: Primary Care

## 2014-09-19 ENCOUNTER — Encounter: Payer: Self-pay | Admitting: Primary Care

## 2014-09-19 VITALS — BP 126/84 | HR 88 | Temp 97.8°F | Ht 68.11 in | Wt 298.0 lb

## 2014-09-19 DIAGNOSIS — F488 Other specified nonpsychotic mental disorders: Secondary | ICD-10-CM

## 2014-09-19 DIAGNOSIS — Z1231 Encounter for screening mammogram for malignant neoplasm of breast: Secondary | ICD-10-CM

## 2014-09-19 DIAGNOSIS — Z803 Family history of malignant neoplasm of breast: Secondary | ICD-10-CM

## 2014-09-19 DIAGNOSIS — IMO0002 Reserved for concepts with insufficient information to code with codable children: Secondary | ICD-10-CM

## 2014-09-19 DIAGNOSIS — M62838 Other muscle spasm: Secondary | ICD-10-CM

## 2014-09-19 DIAGNOSIS — Z0000 Encounter for general adult medical examination without abnormal findings: Secondary | ICD-10-CM

## 2014-09-19 LAB — MAGNESIUM: Magnesium: 1.6 mEq/L (ref 1.3–2.1)

## 2014-09-19 LAB — BASIC METABOLIC PANEL
Anion Gap: 14 (ref 7–16)
CO2: 23 mmol/L (ref 20–28)
Calcium: 9 mg/dL (ref 8.8–10.2)
Chloride: 102 mmol/L (ref 96–108)
Creatinine: 0.93 mg/dL (ref 0.51–0.95)
GFR,Black: 89 *
GFR,Caucasian: 77 *
Glucose: 139 mg/dL — ABNORMAL HIGH (ref 60–99)
Lab: 10 mg/dL (ref 6–20)
Potassium: 4.7 mmol/L (ref 3.3–5.1)
Sodium: 139 mmol/L (ref 133–145)

## 2014-09-19 MED ORDER — INSULIN GLARGINE 100 UNIT/ML SC SOPN *I*
32.0000 [IU] | PEN_INJECTOR | Freq: Every evening | SUBCUTANEOUS | Status: DC
Start: 2014-09-19 — End: 2015-03-15
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
## 2014-09-19 NOTE — Patient Instructions (Addendum)
[facility]  [address]  [id number]  [birth year] at 2:00 pm

## 2014-09-20 ENCOUNTER — Other Ambulatory Visit: Payer: Self-pay | Admitting: Primary Care

## 2014-09-20 ENCOUNTER — Encounter: Payer: Self-pay | Admitting: Primary Care

## 2014-09-21 ENCOUNTER — Encounter: Payer: Self-pay | Admitting: Gastroenterology

## 2014-09-25 ENCOUNTER — Encounter: Payer: Self-pay | Admitting: Gastroenterology

## 2014-09-25 ENCOUNTER — Telehealth: Payer: Self-pay | Admitting: Primary Care

## 2014-09-25 NOTE — Telephone Encounter (Signed)
[facility]   After Hours Note    Date of Call: 09/25/2014    Time of Call: 7:02pm    Message: Patient reports that she went to [facility] yesterday for back pain/abdominal pain. They advised her she may be having gall bladder issues given the location of the pain. She reports it seemed to get better but then got worse again this evening. She just wants to know what she should do? What can she take for it? She reports she has diabetes and uses insulin.     Impression/Plan:  Discussed that its difficult to assume the abdominal pain/back pain is from her gall bladder if no labs or imaging was done. If she's not vomiting, having fevers or if pain responds well to tylenol or ibuprofen or naproxen which she already takes she does not need to go to the ED. If those things occur or the pain is not improved at all by OTC medications or is severe she should proceed to the ED for evaluation. If it seems to improve she should be seen this week for further evaluation; possible labs or imaging as indicated.     Patient agrees with plan.       Karren Burly, MD on 09/25/2014 at 10:36 PM  [facility] [place] [address]  [address]  [place] [address]  [phone]

## 2014-09-26 NOTE — Telephone Encounter (Signed)
See Rayburn Ma, care manager, email to pt regarding this.

## 2014-09-27 ENCOUNTER — Ambulatory Visit: Payer: Self-pay

## 2014-09-27 DIAGNOSIS — F429 Obsessive-compulsive disorder, unspecified: Secondary | ICD-10-CM

## 2014-09-27 DIAGNOSIS — F3341 Major depressive disorder, recurrent, in partial remission: Secondary | ICD-10-CM

## 2014-09-27 DIAGNOSIS — F4310 Post-traumatic stress disorder, unspecified: Secondary | ICD-10-CM

## 2014-09-27 DIAGNOSIS — F449 Dissociative and conversion disorder, unspecified: Secondary | ICD-10-CM

## 2014-09-27 DIAGNOSIS — F603 Borderline personality disorder: Secondary | ICD-10-CM

## 2014-09-27 NOTE — BH Treatment Plan (Addendum)
[facility] Treatment Plan     Date of Plan:   R PSY TP DATE FROM 09/28/2014   FROM 09/27/2014      R PSY TP DATE TO 09/28/2014   TO 12/27/2014       Diagnostic Impression    ICD-10-CM ICD-9-CM   1. 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 OCD (obsessive compulsive disorder) F42 300.3   3. Borderline personality disorder F60.3 301.83   4. PTSD (post-traumatic stress disorder) F43.10 309.81   5. Conversion disorder F44.9 300.11       Strengths  Strengths derived from the assessment include: Patient is hard working, has supports.    Problem Areas  *At least one problem must be targeted toward risk reduction if Formulation of Risk or any other previous exam indicated special monitoring or intervention for suicide and/or violence risk indicated.    PROBLEM AREAS (choose and describe relevant):  THOUGHT: Negative  MOOD:Depressed and anxious  BEHAVIOR: Reactive and avoidant  ECONOMIC:SSDI  ________________________________________________________________  R PSY TP PROBLEM 05/31/2014   1ST TREATMENT PLAN PROBLEM Depression and anxiety        R PSY TP GOAL 05/31/2014   1ST TREATMENT PLAN GOAL Learn DBT and PST skills to manage affect       The rationale for addressing this problem is that resolving it will (select all that apply):  Reduce symptoms of disorder, Reduce functional impairment associated with disorder, Decrease likelihood of hospitalization, Facilitate transfer skills learned in therapy to everday life, Is a key motivational factor for the patient's participation in treatment, Reduce risk for suicide* and Reduce risk for violence*      Progress toward goal(s): Problem resolving. Comment Patient has been out of treatment for 3 months.    1 a. Measurable Objectives : Patient will make and keep regular individual therapy appointments.   Date established: 05/31/14   Target date: 12/27/14   Attained or Revised? continue    1 b. Measurable Objectives : Patient will  continue to learn and utilize skills to manage affect.   Date established: 05/31/14   Target date: 12/27/14   Attained or Revised? continue    R PSY TP PROBLEM 2 05/31/2014   2ND TREATMENT PLAN PROBLEM Negative coping strategies       R PSY TP GOAL 2 05/31/2014   2ND TREATMENT PLAN GOAL Utilize adaptive coping strategies       The rationale for addressing this problem is that resolving it will (select all that apply):  Reduce symptoms of disorder, Reduce functional impairment associated with disorder, Decrease likelihood of hospitalization, Facilitate transfer skills learned in therapy to everday life, Is a key motivational factor for the patient's participation in treatment, Reduce risk for suicide* and Reduce risk for violence*    Progress toward goal(s): Problem resolving. Comment Patient has been out of treatment for 3 months.    2 a. Measurable Objectives : Patient will utilize skills to identify triggers to maladaptive behaviors   Date established: 05/31/14   Target date: 12/27/14   Attained or Revised? continue    2 b. Measurable Objectives : Patient will work to replace maladaptive behaviors with effective behaviors.   Date established: 05/31/14   Target date: 12/27/14   Attained or Revised? continue    ______________________________________________________________________      Plan  TREATMENT MODALITIES:  Individual psychotherapy for 30-60 min Q 1-3 weeks with Tresa Res, LCSW.  Psychopharmacology visits 20-45 min Q 2-8 weeks with  [name], NP.     DISCHARGE CRITERIA for this treatment setting: Patient reports a reduction in symptoms as evidenced by PHQ-9 and GAD-7 scores.    Clinician's name: Tresa Res, LCSW    Psychiatrist's Name: Kerri Perches, MD      Patient/Family Statement  PATIENT/FAMILY STATEMENT:  Obtain patient and family input into the treatment plan, including areas of agreement / disagreement.  Obtain patient's signature - if not possible, briefly describe the reason.     Patient  Comments:          I HAVE PARTICIPATED IN THE DEVELOPMENT OF THIS TREATMENT PLAN AND I AGREE WITH ITS CONTENTS:       Patient Signature: _______________________________________________________    Date: ______________________________

## 2014-09-27 NOTE — Progress Notes (Addendum)
Behavioral Health Progress Note     LENGTH OF SESSION: 45 minutes    Contact Type:  Location: On Site    Face to Face     Problem(s)/Goals Addressed from Treatment Plan:    Problem 1:   R PSY TP PROBLEM 05/31/2014   1ST TREATMENT PLAN PROBLEM Depression and anxiety       Goal for this problem:    R PSY TP GOAL 05/31/2014   1ST TREATMENT PLAN GOAL Learn DBT and PST skills to manage affect       Progress towards this goal:  Patient has been out of treatment with writer since 07/04/14.    Mental Status Exam:  APPEARANCE: Appears stated age, Well-groomed, Casual  ATTITUDE TOWARD INTERVIEWER: Cooperative and Evasive  MOTOR ACTIVITY: WNL (within normal limits)  EYE CONTACT: Indirect  SPEECH: Normal rate and tone  AFFECT: Full Range  MOOD: Neutral  THOUGHT PROCESS: Circumstantial and Concrete  THOUGHT CONTENT: Negative Rumination  PERCEPTION: No evidence of hallucinations  ORIENTATION: Alert and Oriented X 3.  CONCENTRATION: Good  MEMORY:   Recent: intact   Remote: intact  COGNITIVE FUNCTION: Average intelligence  JUDGMENT: Intact  IMPULSE CONTROL: Fair  INSIGHT: Fair    Risk Assessment:  ASSESSMENT OF RISK FOR SUICIDAL BEHAVIOR  Changes in risk for suicide from baseline Formulation of Risk and/or previous intake, including newly identified risk, if any: none  Violence risk was assessed and No Change noted from baseline formulation of risk and/or previous assessment.    Session Content:: Patient has been out of treatment with writer since 07/04/14. Writer and patient talked about patient's lack of engagement, goals for treatment. Patient talked about her physical symptoms and said that she is being retested. She said that she is frustrated by uncertainty of whether her symptoms are medical or psychogenic, but remains fixed in her disbelief in the possibility that they are psychogenic. Writer had patient sign an Naval architect. Writer and patient discussed patient re-engaging in [place] and patient was resistant to this.  Writer will begin CBT with patient next time.    Interventions:    Supportive Psychotherapy, Treatment Planning, Attendance contract    Plan:  Psychotherapy continues as described in care plan; plan remains the same.    NEXT APPT: 10/19/14 @4       Tresa Res, LCSW

## 2014-09-28 ENCOUNTER — Ambulatory Visit: Payer: Self-pay | Admitting: Nutrition

## 2014-09-28 ENCOUNTER — Ambulatory Visit
Admit: 2014-09-28 | Discharge: 2014-09-28 | Disposition: A | Discharge status: Home / Self Care | Payer: Self-pay | Source: Ambulatory Visit | Attending: Primary Care | Admitting: Primary Care

## 2014-09-30 ENCOUNTER — Other Ambulatory Visit: Payer: Self-pay | Admitting: Psychiatry

## 2014-09-30 ENCOUNTER — Other Ambulatory Visit: Payer: Self-pay | Admitting: Primary Care

## 2014-09-30 ENCOUNTER — Encounter: Payer: Self-pay | Admitting: Gastroenterology

## 2014-10-01 ENCOUNTER — Encounter: Payer: Self-pay | Admitting: General Practice

## 2014-10-01 ENCOUNTER — Emergency Department
Admission: EM | Admit: 2014-10-01 | Disposition: A | Discharge status: Home / Self Care | Payer: Self-pay | Source: Ambulatory Visit | Attending: Emergency Medicine | Admitting: Emergency Medicine

## 2014-10-01 ENCOUNTER — Other Ambulatory Visit: Payer: Self-pay | Admitting: Gastroenterology

## 2014-10-01 LAB — CBC AND DIFFERENTIAL
Baso # K/uL: 0.1 10*3/uL (ref 0.0–0.1)
Basophil %: 0.6 %
Eos # K/uL: 0.3 10*3/uL (ref 0.0–0.4)
Eosinophil %: 2.1 %
Hematocrit: 38 % (ref 34–45)
Hemoglobin: 12 g/dL (ref 11.2–15.7)
IMM Granulocytes #: 0.1 10*3/uL (ref 0.0–0.1)
IMM Granulocytes: 1 %
Lymph # K/uL: 2.9 10*3/uL (ref 1.2–3.7)
Lymphocyte %: 23.7 %
MCH: 26 pg/cell (ref 26–32)
MCHC: 32 g/dL (ref 32–36)
MCV: 84 fL (ref 79–95)
Mono # K/uL: 0.6 10*3/uL (ref 0.2–0.9)
Monocyte %: 5.1 %
Neut # K/uL: 8.2 10*3/uL — ABNORMAL HIGH (ref 1.6–6.1)
Nucl RBC # K/uL: 0 10*3/uL (ref 0.0–0.0)
Nucl RBC %: 0 /100 WBC (ref 0.0–0.2)
Platelets: 236 10*3/uL (ref 160–370)
RBC: 4.5 MIL/uL (ref 3.9–5.2)
RDW: 14.2 % (ref 11.7–14.4)
Seg Neut %: 67.5 %
WBC: 12.2 10*3/uL — ABNORMAL HIGH (ref 4.0–10.0)

## 2014-10-01 LAB — PROTIME-INR
INR: 1 (ref 1.0–1.2)
Protime: 11.4 s (ref 9.2–12.3)

## 2014-10-01 LAB — PLASMA PROF 7 (ED ONLY)
Anion Gap,PL: 16 (ref 7–16)
CO2,Plasma: 19 mmol/L — ABNORMAL LOW (ref 20–28)
Chloride,Plasma: 105 mmol/L (ref 96–108)
Creatinine: 0.78 mg/dL (ref 0.51–0.95)
GFR,Black: 110 *
GFR,Caucasian: 95 *
Glucose,Plasma: 113 mg/dL — ABNORMAL HIGH (ref 60–99)
Potassium,Plasma: 3.9 mmol/L (ref 3.4–4.7)
Sodium,Plasma: 140 mmol/L (ref 132–146)
UN,Plasma: 7 mg/dL (ref 6–20)

## 2014-10-01 LAB — RUQ PANEL (ED ONLY)
ALT: 21 U/L (ref 0–35)
AST: 14 U/L (ref 0–35)
Albumin: 4.1 g/dL (ref 3.5–5.2)
Alk Phos: 89 U/L (ref 35–105)
Amylase: 34 U/L (ref 28–100)
Bilirubin,Direct: <0.2 mg/dL (ref 0.0–0.3)
Bilirubin,Total: <0.2 mg/dL (ref 0.0–1.2)
Lipase: 32 U/L (ref 13–60)
Total Protein: 6.8 g/dL (ref 6.3–7.7)

## 2014-10-01 LAB — TYPE AND SCREEN
ABO RH Blood Type: A NEG
Antibody Screen: NEGATIVE

## 2014-10-01 LAB — POCT GLUCOSE: Glucose POCT: 159 mg/dL — ABNORMAL HIGH (ref 60–99)

## 2014-10-01 LAB — LACTATE, PLASMA: Lactate: 1.3 mmol/L (ref 0.5–2.2)

## 2014-10-01 LAB — APTT: aPTT: 28.7 s (ref 25.8–37.9)

## 2014-10-01 LAB — ETHANOL: Ethanol: <10 mg/dL

## 2014-10-01 MED ORDER — ONDANSETRON HCL 2 MG/ML IV SOLN *I*
4.0000 mg | Freq: Once | INTRAMUSCULAR | Status: AC
Start: 2014-10-01 — End: 2014-10-01
  Administered 2014-10-01: 4 mg via INTRAVENOUS
  Filled 2014-10-01: qty 2

## 2014-10-01 MED ORDER — FENTANYL CITRATE 50 MCG/ML IJ SOLN *WRAPPED*
50.0000 ug | Freq: Once | INTRAMUSCULAR | Status: AC
Start: 2014-10-01 — End: 2014-10-01
  Filled 2014-10-01: qty 2

## 2014-10-01 MED ORDER — HYDROMORPHONE HCL PF 1 MG/ML IJ SOLN *WRAPPED*
0.5000 mg | INTRAMUSCULAR | Status: AC | PRN
Start: 2014-10-01 — End: 2014-10-01
  Administered 2014-10-01: 0.5 mg via INTRAVENOUS
  Filled 2014-10-01: qty 1

## 2014-10-01 MED ORDER — HYDROCODONE-ACETAMINOPHEN 5-325 MG PO TABS *I*
2.0000 | ORAL_TABLET | Freq: Once | ORAL | Status: AC
Start: 2014-10-01 — End: 2014-10-01
  Administered 2014-10-01: 2 via ORAL
  Filled 2014-10-01: qty 2

## 2014-10-01 MED ORDER — FENTANYL CITRATE 50 MCG/ML IJ SOLN *WRAPPED*
INTRAMUSCULAR | Status: AC | PRN
Start: 2014-10-01 — End: 2014-10-01
  Administered 2014-10-01: 18:00:00 50 ug via INTRAVENOUS

## 2014-10-01 MED ORDER — HYDROCODONE-ACETAMINOPHEN 5-325 MG PO TABS *I*
1.0000 | ORAL_TABLET | Freq: Four times a day (QID) | ORAL | Status: DC | PRN
Start: 2014-10-01 — End: 2014-11-02

## 2014-10-01 MED ORDER — IOHEXOL 350 MG/ML (OMNIPAQUE) IV SOLN *I*
1.0000 mL | Freq: Once | INTRAVENOUS | Status: AC
Start: 2014-10-01 — End: 2014-10-01
  Administered 2014-10-01: 150 mL via INTRAVENOUS

## 2014-10-01 MED ORDER — HYDROCODONE-ACETAMINOPHEN 5-325 MG PO TABS *I*
1.0000 | ORAL_TABLET | Freq: Four times a day (QID) | ORAL | Status: DC | PRN
Start: 2014-10-01 — End: 2014-10-01

## 2014-10-01 MED ORDER — FENTANYL CITRATE 50 MCG/ML IJ SOLN *WRAPPED*
INTRAMUSCULAR | Status: DC
Start: 2014-10-01 — End: 2014-10-01
  Administered 2014-10-01: 19:00:00 50 ug via INTRAVENOUS
  Filled 2014-10-01: qty 2
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
## 2014-10-01 NOTE — ED Notes (Signed)
10/01/14 2147   Admission   CPEP Legal Status on Arrival Voluntary   *Mode of Arrival Ambulatory   First Contact 2148   Admitting Procedure   *Belonging Search No belongings   *Belongings Searched By Lilia Pro   *Patient Checked for Contraband Body Scanned with Wand;Clothing Checked;Sharps Removed   *Oriented to Unit Yes   *Information/Orientation Given Program orientation   15 Minute Checks Maintained Yes   Belongings   Dentures None   Vision - Corrective Lenses None   Hearing Aid/Cochlear Implant None   Jewelry None   Clothing (none)   Other Valuables None   Prosthesis / Assistive Devices None   Valuables Given To None
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
## 2014-10-01 NOTE — ED Notes (Signed)
2 knifes secured by DPS.

## 2014-10-01 NOTE — Procedures (Signed)
Procedure Report    ED Laceration Repair Note  ______________________________________________    Patient: Kaylee Harris    Age: 39 y.o.     Gender: Female     MRN: [id number]     Supervising Attending: Santo Held, MD    Procedure performed by: Windy Canny, MD    Assistants: n/a    Anesthesia: none     EBL: <5TX    Complications: none    Description: After performing verbal consent and a time-out, patient was premedicated with 50mg  fentanyl. Following inspection for foreign debris (and removal, if present), the wound was then irrigated copiously with normal saline and betadine. The area was then prepped and draped in the usual sterile fashion. The wound was reapproximated with 4-0 plain gut suture. Following this, the area was cleansed and dressed. The patient tolerated the procedure well.    Laceration care instructions have been added to the patient's discharge instructions.     Signed: Windy Canny, MD as of: 10/01/2014  at: 7:19 PM

## 2014-10-01 NOTE — ED Notes (Signed)
ED Trauma Nursing Note     To CT for abd/pevis imaging. Vitals stable. Some hypoxia to 88% while laying flat. Placed on 2L NC for imaging.     Lulu Riding, RN, 10/01/2014, 6:39 PM
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
## 2014-10-01 NOTE — ED Notes (Signed)
Pt up to ambulate with steady gait, c/o some nausea and lightheaded after walking , md notified, pt given g ale and tuna sandwich

## 2014-10-01 NOTE — ED Notes (Signed)
ED Trauma Nursing Note     Allergies to toradol, trazadone, morphine    Lulu Riding, RN, 10/01/2014, 6:27 PM

## 2014-10-02 MED ORDER — HYDROCODONE-ACETAMINOPHEN 5-325 MG PO TABS *I*
2.0000 | ORAL_TABLET | Freq: Four times a day (QID) | ORAL | Status: DC | PRN
Start: 2014-10-02 — End: 2014-10-02
  Administered 2014-10-02: 2 via ORAL
  Filled 2014-10-02: qty 2
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
## 2014-10-02 NOTE — ED Notes (Signed)
10/02/14 0631   Disposition details   Patient is being discharged to community   Family notification done via phone   Name of family member notified wife [name]   Outpatient provider notification of disposition not done-unable to reach   Living situation: notification of disposition via phone   Transportation arranged via family   Transporting family member name wife [name]   Patient belongings  given to patient   Medications NA- patient has none
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
## 2014-10-02 NOTE — ED Notes (Signed)
10/02/14 0630   CPEP Summary of Services   Case consultation/discussion involving attending;registered nurse   Crisis intervention and safety planning involving access to firearms discussed and denied   Collateral information obtained from family member(s)   Referral offered, accepted and completed for  No additional referrals needed
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
## 2014-10-02 NOTE — CPEP Notes (Signed)
CPEP to do list completed as outline by the MD.
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
## 2014-10-04 ENCOUNTER — Encounter: Payer: Self-pay | Admitting: General Practice

## 2014-10-04 ENCOUNTER — Telehealth: Payer: Self-pay

## 2014-10-04 ENCOUNTER — Encounter: Payer: Self-pay | Admitting: Primary Care

## 2014-10-04 ENCOUNTER — Other Ambulatory Visit: Payer: Self-pay | Admitting: Psychiatry

## 2014-10-04 DIAGNOSIS — K760 Fatty (change of) liver, not elsewhere classified: Secondary | ICD-10-CM | POA: Insufficient documentation

## 2014-10-04 MED ORDER — INSULIN PEN NEEDLE 30G X 8 MM MISC *A*
Status: DC
Start: 2014-10-04 — End: 2015-03-15

## 2014-10-04 MED ORDER — LURASIDONE HCL 80 MG PO TABS *I*
80.0000 mg | ORAL_TABLET | Freq: Every day | ORAL | Status: DC
Start: 2014-10-04 — End: 2014-11-02
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
## 2014-10-05 ENCOUNTER — Other Ambulatory Visit: Payer: Self-pay

## 2014-10-05 NOTE — Comprehensive Assessment (Signed)
10/05/14 1507   DSRIP intervention status   DSRIP Intervention status Initial intervention   DSRIP Patient? Yes   Patient address confirmation Yes   Patient phone confirmation Yes   Patient insurance confirmation Yes   Patient [facility] referred? No   Patient PCP confirmation Yes   Patient followup provider [name]   DSRIP Patient followup appointment date 10/24/14   Patient followup appointment time 1540   Patient followup appointment center [place]   Patient followup appointment address [address] [place] [address]   Patient transport plan self   CHW handoff to involved agency? No   Time Spent with Patient (min) [place] Worker   [phone]
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
## 2014-10-05 NOTE — Telephone Encounter (Signed)
Patient notified

## 2014-10-07 ENCOUNTER — Encounter: Payer: Self-pay | Admitting: Primary Care

## 2014-10-07 DIAGNOSIS — IMO0002 Reserved for concepts with insufficient information to code with codable children: Secondary | ICD-10-CM

## 2014-10-08 ENCOUNTER — Encounter: Payer: Self-pay | Admitting: Emergency Medicine

## 2014-10-08 ENCOUNTER — Emergency Department
Admission: EM | Admit: 2014-10-08 | Disposition: A | Discharge status: Home / Self Care | Payer: Self-pay | Source: Ambulatory Visit | Attending: Emergency Medicine | Admitting: Emergency Medicine

## 2014-10-08 LAB — RUQ PANEL (ED ONLY)
ALT: 21 U/L (ref 0–35)
AST: 20 U/L (ref 0–35)
Albumin: 4 g/dL (ref 3.5–5.2)
Alk Phos: 89 U/L (ref 35–105)
Amylase: 30 U/L (ref 28–100)
Bilirubin,Direct: <0.2 mg/dL (ref 0.0–0.3)
Bilirubin,Total: <0.2 mg/dL (ref 0.0–1.2)
Lipase: 27 U/L (ref 13–60)
Total Protein: 6.8 g/dL (ref 6.3–7.7)

## 2014-10-08 LAB — CBC AND DIFFERENTIAL
Baso # K/uL: 0.1 10*3/uL (ref 0.0–0.1)
Basophil %: 0.6 %
Eos # K/uL: 0.3 10*3/uL (ref 0.0–0.4)
Eosinophil %: 2.4 %
Hematocrit: 37 % (ref 34–45)
Hemoglobin: 11.6 g/dL (ref 11.2–15.7)
IMM Granulocytes #: 0.1 10*3/uL (ref 0.0–0.1)
IMM Granulocytes: 0.7 %
Lymph # K/uL: 2.7 10*3/uL (ref 1.2–3.7)
Lymphocyte %: 23.3 %
MCH: 27 pg/cell (ref 26–32)
MCHC: 31 g/dL — ABNORMAL LOW (ref 32–36)
MCV: 85 fL (ref 79–95)
Mono # K/uL: 0.6 10*3/uL (ref 0.2–0.9)
Monocyte %: 5.4 %
Neut # K/uL: 7.9 10*3/uL — ABNORMAL HIGH (ref 1.6–6.1)
Nucl RBC # K/uL: 0 10*3/uL (ref 0.0–0.0)
Nucl RBC %: 0 /100 WBC (ref 0.0–0.2)
Platelets: 345 10*3/uL (ref 160–370)
RBC: 4.4 MIL/uL (ref 3.9–5.2)
RDW: 14.4 % (ref 11.7–14.4)
Seg Neut %: 67.6 %
WBC: 11.7 10*3/uL — ABNORMAL HIGH (ref 4.0–10.0)

## 2014-10-08 LAB — BASIC METABOLIC PANEL
Anion Gap: 18 — ABNORMAL HIGH (ref 7–16)
CO2: 20 mmol/L (ref 20–28)
Calcium: 8.6 mg/dL — ABNORMAL LOW (ref 8.8–10.2)
Chloride: 105 mmol/L (ref 96–108)
Creatinine: 0.82 mg/dL (ref 0.51–0.95)
GFR,Black: 103 *
GFR,Caucasian: 89 *
Glucose: 118 mg/dL — ABNORMAL HIGH (ref 60–99)
Lab: 8 mg/dL (ref 6–20)
Potassium: 4.1 mmol/L (ref 3.3–5.1)
Sodium: 143 mmol/L (ref 133–145)

## 2014-10-08 LAB — LACTATE, PLASMA: Lactate: 1.8 mmol/L (ref 0.5–2.2)

## 2014-10-08 LAB — BHCG, QUANT PREGNANCY: BHCG, QUANT PREGNANCY: <1 m[IU]/mL (ref 0–1)

## 2014-10-08 LAB — HOLD BLUE

## 2014-10-08 LAB — HOLD GREEN WITH GEL

## 2014-10-08 MED ORDER — HYDROMORPHONE HCL PF 1 MG/ML IJ SOLN *WRAPPED*
1.0000 mg | Freq: Once | INTRAMUSCULAR | Status: AC
Start: 2014-10-08 — End: 2014-10-08
  Administered 2014-10-08: 1 mg via INTRAVENOUS
  Filled 2014-10-08: qty 1

## 2014-10-08 MED ORDER — ACETAMINOPHEN 325 MG PO TABS *I*
650.0000 mg | ORAL_TABLET | Freq: Once | ORAL | Status: AC
Start: 2014-10-08 — End: 2014-10-08
  Administered 2014-10-08: 650 mg via ORAL
  Filled 2014-10-08: qty 2

## 2014-10-08 MED ORDER — PROMETHAZINE HCL 12.5 MG PO TABS *I*
12.5000 mg | ORAL_TABLET | ORAL | Status: DC | PRN
Start: 2014-10-08 — End: 2015-05-11

## 2014-10-08 MED ORDER — ONDANSETRON HCL 2 MG/ML IV SOLN *I*
4.0000 mg | Freq: Once | INTRAMUSCULAR | Status: DC
Start: 2014-10-08 — End: 2014-10-09
  Filled 2014-10-08: qty 2

## 2014-10-08 MED ORDER — FAMOTIDINE IN NACL 20 MG/50ML IV SOLN *I*
20.0000 mg | Freq: Once | INTRAVENOUS | Status: AC
Start: 2014-10-08 — End: 2014-10-08
  Administered 2014-10-08: 20 mg via INTRAVENOUS
  Filled 2014-10-08: qty 50

## 2014-10-08 MED ORDER — SODIUM CHLORIDE 0.9 % IV BOLUS *I*
1000.0000 mL | Freq: Once | Status: AC
Start: 2014-10-08 — End: 2014-10-08
  Administered 2014-10-08: 1000 mL via INTRAVENOUS

## 2014-10-08 MED ORDER — ONDANSETRON HCL 2 MG/ML IV SOLN *I*
4.0000 mg | Freq: Once | INTRAMUSCULAR | Status: AC
Start: 2014-10-08 — End: 2014-10-08
  Administered 2014-10-08: 4 mg via INTRAVENOUS

## 2014-10-08 MED ORDER — PROMETHAZINE HCL 25 MG/ML IJ SOLN *I*
12.5000 mg | Freq: Once | INTRAMUSCULAR | Status: AC
Start: 2014-10-08 — End: 2014-10-09
  Administered 2014-10-08: 12.5 mg via INTRAVENOUS
  Filled 2014-10-08: qty 1

## 2014-10-08 MED ORDER — SODIUM CHLORIDE 0.9 % IV SOLN WRAPPED *I*
125.0000 mL/h | Status: DC
Start: 2014-10-08 — End: 2014-10-09

## 2014-10-08 MED ORDER — HYDROMORPHONE HCL PF 1 MG/ML IJ SOLN *WRAPPED*
1.0000 mg | Freq: Once | INTRAMUSCULAR | Status: DC
Start: 2014-10-08 — End: 2014-10-08
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
## 2014-10-08 NOTE — Discharge Instructions (Signed)
-  Return to the ED if you have worsening pain, nausea, vomiting, or other concerns.  -Otherwise, see your primary care provider in 3-4 days for reevaluation and to ensure that you are doing well  -Continuing taking your home medications as prescribed    -A prescription for phenergan has been sent to your pharmacy
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
## 2014-10-09 NOTE — ED Notes (Signed)
Appropriate clothing in place. Discharge instructions reviewed and pt verbalized understanding. Pt is leaving via wheelchair. Pt tolerating PO intake. Pt will follow up with PCP. Pt belongings with pt. Pt safe to D/C a this time. Family at bedside is driving pt home.

## 2014-10-10 ENCOUNTER — Other Ambulatory Visit: Payer: Self-pay | Admitting: Psychiatry

## 2014-10-10 ENCOUNTER — Other Ambulatory Visit: Payer: Self-pay | Admitting: Primary Care

## 2014-10-11 ENCOUNTER — Other Ambulatory Visit: Payer: Self-pay

## 2014-10-11 MED ORDER — FAMCICLOVIR 500 MG PO TABS *I*
500.0000 mg | ORAL_TABLET | Freq: Three times a day (TID) | ORAL | 5 refills | Status: DC | PRN
Start: 2014-10-11 — End: 2016-10-05

## 2014-10-12 ENCOUNTER — Other Ambulatory Visit: Payer: Self-pay | Admitting: Psychiatry

## 2014-10-12 LAB — EKG 12-LEAD
P: 16 degrees
QRS: 40 degrees
Rate: 88 {beats}/min
Severity: NORMAL
Severity: NORMAL
T: 36 degrees

## 2014-10-12 MED ORDER — ZOLPIDEM TARTRATE 6.25 MG PO TBCR *A*
6.2500 mg | ORAL_TABLET | Freq: Every evening | ORAL | 1 refills | Status: DC | PRN
Start: 2014-10-12 — End: 2014-12-14

## 2014-10-13 ENCOUNTER — Ambulatory Visit: Payer: Self-pay

## 2014-10-13 DIAGNOSIS — F3340 Major depressive disorder, recurrent, in remission, unspecified: Secondary | ICD-10-CM
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
## 2014-10-14 NOTE — Telephone Encounter (Signed)
REviewed BG log  Having high postprandial sugars, morning sugars seem best and at goal on lantus 36U    Will refer to [facility] for consideration of humalog    [name], MD  [facility]  10/14/2014  7:12 AM

## 2014-10-15 ENCOUNTER — Encounter: Payer: Self-pay | Admitting: Primary Care

## 2014-10-17 MED ORDER — INSULIN LISPRO (HUMAN) 100 UNIT/ML SC SOPN *I*
4.0000 [IU] | PEN_INJECTOR | Freq: Three times a day (TID) | SUBCUTANEOUS | 2 refills | Status: DC
Start: 2014-10-27 — End: 2014-11-21

## 2014-10-17 NOTE — Telephone Encounter (Signed)
Dr Moore, please advise.

## 2014-10-19 ENCOUNTER — Other Ambulatory Visit: Payer: Self-pay | Admitting: Psychiatry

## 2014-10-19 MED ORDER — MIRTAZAPINE 7.5 MG PO TABS *A*
7.5000 mg | ORAL_TABLET | Freq: Every evening | ORAL | 1 refills | Status: DC
Start: 2014-10-19 — End: 2014-12-20

## 2014-10-20 ENCOUNTER — Telehealth: Payer: Self-pay | Admitting: Primary Care
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
## 2014-10-24 ENCOUNTER — Ambulatory Visit: Payer: Self-pay | Admitting: Primary Care

## 2014-10-24 NOTE — Telephone Encounter (Signed)
Pt is scheduled 10/24/14 for f/u

## 2014-10-25 ENCOUNTER — Other Ambulatory Visit: Payer: Self-pay | Admitting: Psychiatry

## 2014-10-25 ENCOUNTER — Encounter: Payer: Self-pay | Admitting: Nutrition

## 2014-10-25 MED ORDER — BENZTROPINE MESYLATE 1 MG PO TABS *I*
1.0000 mg | ORAL_TABLET | Freq: Two times a day (BID) | ORAL | 0 refills | Status: DC
Start: 2014-10-25 — End: 2014-11-02

## 2014-10-26 ENCOUNTER — Telehealth: Payer: Self-pay

## 2014-10-26 ENCOUNTER — Encounter: Payer: Self-pay | Admitting: Primary Care

## 2014-10-26 ENCOUNTER — Ambulatory Visit: Payer: Self-pay | Admitting: Nutrition

## 2014-10-26 NOTE — Telephone Encounter (Signed)
I called the patient and left a message asking her to give our office a call back. Please see the message below. The patient needs to contact [facility] at [phone] to schedule a sooner appointment.

## 2014-10-26 NOTE — Telephone Encounter (Signed)
I spoke to the patient and she is going to call DHS and get a sooner appointment.

## 2014-10-26 NOTE — Telephone Encounter (Signed)
I spoke to the patient and gave her the information below. I have scheduled her to see Kaylee Partridge, DNP on 11/08/14. The patient said that she is scheduled to see [facility] on 01/18/15 at our office. I explained that I would let Dr. Laurance Flatten know the date of her appointment with [facility] and see if she should go to the other location so she can been seen much sooner. Please advise.

## 2014-10-26 NOTE — Telephone Encounter (Signed)
I spoke to the patient and gave her the information that she was looking for again

## 2014-10-26 NOTE — Telephone Encounter (Signed)
Yes please try to get her in sooner at other office, next 1-2 months if possible    [name], MD  [facility]  10/26/2014  12:36 PM

## 2014-10-26 NOTE — Telephone Encounter (Signed)
I would like her following up with [facility]. She can start the insulin herself, but want her to check the sugar 2h after taking the insulin. Call me if <100 or feelign "low". Check after every meal and every time she uses the humalog for at least 1 week. Check more frequently as needed if she ever feels "low".    She needs f/u with me or Lorie in 1-2 weeks re: DM2 and f/u with [facility] either way, to review how her sugars are with this new insulin on board going forward.    [name], MD  [facility]  10/26/2014  12:16 PM

## 2014-10-26 NOTE — Telephone Encounter (Signed)
Pt called and canceled her appointment with [facility] for today and was told be bring her new insulin that she is supposed be starting today, but pt would like to know if its okay to begin taking this on her own? Pt did report that the instructions and dosage is on the bottle so she states she can do it herself just wants to make sure that this is okay. Pt can be reached at the number listed above as it was verified with her as the best number to reach her. Please advise

## 2014-10-27 ENCOUNTER — Encounter: Payer: Self-pay | Admitting: Primary Care

## 2014-10-31 ENCOUNTER — Other Ambulatory Visit: Payer: Self-pay | Admitting: Primary Care

## 2014-10-31 ENCOUNTER — Encounter: Payer: Self-pay | Admitting: Gastroenterology

## 2014-10-31 MED ORDER — FREESTYLE LITE TEST VI STRP *A*
ORAL_STRIP | 2 refills | Status: DC
Start: 2014-10-31 — End: 2014-11-14

## 2014-10-31 MED ORDER — ALCOHOL WIPES PADS
MEDICATED_PAD | 1 refills | Status: DC
Start: 2014-10-31 — End: 2017-04-30

## 2014-10-31 MED ORDER — LANCETS MISC *A*
2 refills | Status: DC
Start: 2014-10-31 — End: 2014-11-14

## 2014-11-01 ENCOUNTER — Other Ambulatory Visit: Payer: Self-pay

## 2014-11-01 NOTE — Progress Notes (Signed)
Call to pt for [facility] ED visit on 10/31/14, no answer, left message to call office, number provided. Will send My Chart message as well

## 2014-11-02 ENCOUNTER — Ambulatory Visit: Payer: Self-pay

## 2014-11-02 ENCOUNTER — Ambulatory Visit: Payer: Self-pay | Admitting: Psychiatry

## 2014-11-02 ENCOUNTER — Other Ambulatory Visit: Payer: Self-pay | Admitting: Psychiatry

## 2014-11-02 ENCOUNTER — Encounter: Payer: Self-pay | Admitting: Psychiatry

## 2014-11-02 VITALS — BP 128/87 | HR 88 | Ht 68.11 in | Wt 297.0 lb

## 2014-11-02 DIAGNOSIS — F603 Borderline personality disorder: Secondary | ICD-10-CM

## 2014-11-02 DIAGNOSIS — F3341 Major depressive disorder, recurrent, in partial remission: Secondary | ICD-10-CM

## 2014-11-02 DIAGNOSIS — F4310 Post-traumatic stress disorder, unspecified: Secondary | ICD-10-CM

## 2014-11-02 DIAGNOSIS — F3340 Major depressive disorder, recurrent, in remission, unspecified: Secondary | ICD-10-CM

## 2014-11-02 DIAGNOSIS — Z5181 Encounter for therapeutic drug level monitoring: Secondary | ICD-10-CM

## 2014-11-02 DIAGNOSIS — F449 Dissociative and conversion disorder, unspecified: Secondary | ICD-10-CM

## 2014-11-02 DIAGNOSIS — F429 Obsessive-compulsive disorder, unspecified: Secondary | ICD-10-CM

## 2014-11-02 MED ORDER — CLONAZEPAM 0.5 MG PO TABS *I*
0.5000 mg | ORAL_TABLET | Freq: Two times a day (BID) | ORAL | 0 refills | Status: DC | PRN
Start: 2014-11-02 — End: 2014-11-30

## 2014-11-02 MED ORDER — DULOXETINE HCL 60 MG PO CPEP *I*
60.0000 mg | DELAYED_RELEASE_CAPSULE | Freq: Every day | ORAL | 2 refills | Status: DC
Start: 2014-11-02 — End: 2015-04-25

## 2014-11-02 MED ORDER — ARIPIPRAZOLE 5 MG PO TABS *I*
5.0000 mg | ORAL_TABLET | Freq: Every morning | ORAL | 0 refills | Status: DC
Start: 2014-11-02 — End: 2014-11-30

## 2014-11-02 NOTE — Progress Notes (Signed)
\  Behavioral Health Progress Note     LENGTH OF SESSION: 30 minutes    Contact Type:  Location: On Site    Face to Face     Problem(s)/Goals Addressed from Treatment Plan:    Problem 1:   R PSY TP PROBLEM 09/28/2014   1ST TREATMENT PLAN PROBLEM Depression and anxiety       Goal for this problem:    R PSY TP GOAL 09/28/2014   1ST TREATMENT PLAN GOAL Learn DBT and PST skills to manage affect       Progress towards this goal: Problem resolving. Comment Patient reports successful boundary setting.    Mental Status Exam:  APPEARANCE: Appears stated age, Well-groomed, Casual  ATTITUDE TOWARD INTERVIEWER: Cooperative  MOTOR ACTIVITY: WNL (within normal limits)  EYE CONTACT: Direct  SPEECH: Normal rate and tone  AFFECT: Full Range  MOOD: Neutral  THOUGHT PROCESS: Circumstantial  THOUGHT CONTENT: Negative Rumination  PERCEPTION: No evidence of hallucinations  CURRENT SUICIDAL IDEATION: patient denies  CURRENT HOMICIDAL IDEATION: Patient denies  ORIENTATION: Alert and Oriented X 3.  CONCENTRATION: Good  MEMORY:   Recent: intact   Remote: intact  COGNITIVE FUNCTION: Average intelligence  JUDGMENT: Intact  IMPULSE CONTROL: Fair  INSIGHT: Fair    Risk Assessment:  ASSESSMENT OF RISK FOR SUICIDAL BEHAVIOR  Changes in risk for suicide from baseline Formulation of Risk and/or previous intake, including newly identified risk, if any: none  Violence risk was assessed and No Change noted from baseline formulation of risk and/or previous assessment.    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 Patient said that she will begin taking medication to control this although she is worried about one of the medications because this is one she overdosed on in the past. She said having it in the house makes her nervous although she denied the urge to misuse. Patient said that her wife is still monitoring her medications and that they are locked up. Writer and patient discussed patient's  suicide attempts as her coping mechanism and working to manage this as a behavior .Patient talked about her friend that cut her off after her suicide attempt last year and her feelings about this. Writer validated patient's thoughts and feelings and normalized patient's fears after her last attempt about Probation officer and her medication provider being "mad" at her.     Patient told Probation officer about her parents' anniversary party, preparations, etc. And her successful limit and boundary setting. Writer commended patient on her progress.     Interventions:  Supportive Psychotherapy, Solution Focused therapy    Plan:  Psychotherapy continues as described in care plan; plan remains the same.    NEXT APPT: 11/22/14 @1       Tresa Res, LCSW

## 2014-11-02 NOTE — Progress Notes (Addendum)
Behavioral Health Psychopharmacology Follow-up     Length of Session: 30 minutes.    Diagnosis Addressed    ICD-10-CM ICD-9-CM   1. MDD (recurrent major depressive disorder) in remission F33.40 296.35   2. Conversion disorder F44.9 300.11   3. OCD (obsessive compulsive disorder) F42 300.3   4. Borderline personality disorder F60.3 301.83   5. PTSD (post-traumatic stress disorder) F43.10 309.81       Recent History and Response to Medications  Patient states:   HPI     Follow-up    Additional comments: patient here for medication evaluation       Last edited by Rickard Rhymes, RN on 11/02/2014  2:15 PM. (History)          Current use of alcohol or drugs: No        Neurovegetative Symptoms Review:  Energy level: fair  Concentration: fair  Sleep Quality: good   Number of hours : 9  Appetite: good     Wt Readings from Last 3 Encounters:   11/02/14 134.7 kg (297 lb)   10/08/14 132.5 kg (292 lb)   10/01/14 132 kg (291 lb)     Enjoyment/interest: poor        Current Medications  Current Outpatient Prescriptions   Medication Sig    midodrine (PROAMATINE) 5 MG tablet Take 5 mg by mouth 2 times daily    atenolol (TENORMIN) 25 MG tablet Take 25 mg by mouth daily    lancets Brand Free Style Lite; Use 2 times per day as directed for blood glucose testing.    FREESTYLE LITE test strip Use BID as directed for 250.02    Alcohol Swabs (ALCOHOL WIPES) PADS Use BID for BG check    benztropine (COGENTIN) 1 MG tablet Take 1 tablet (1 mg total) by mouth 2 times daily    mirtazapine (REMERON) 7.5 MG tablet Take 1 tablet (7.5 mg total) by mouth nightly    insulin lispro (HUMALOG KWIKPEN) 100 UNIT/ML injection pen Inject 4 Units into the skin 3 times daily (before meals)   Meet with diabetes nurse 6/22 first, do not start until after that.    zolpidem (AMBIEN CR) 6.25 MG CR tablet Take 1 tablet (6.25 mg total) by mouth nightly as needed for Sleep   Max daily dose: 6.25 mg Swallow whole. Do not crush, break, or chew.    dicyclomine  (BENTYL) 20 MG tablet TAKE 1 TABLET (20 MG TOTAL) BY MOUTH 4 TIMES DAILY (BEFORE MEALS AND NIGHTLY)    promethazine (PHENERGAN) 12.5 MG tablet Take 1 tablet (12.5 mg total) by mouth every 4-6 hours as needed for Nausea    lurasidone (LATUDA) 80 MG tablet Take 1 tablet (80 mg total) by mouth daily    insulin pen needle (NOVOFINE) 30G X 8 MM Use 1 times a day as instructed.    albuterol HFA 108 (90 BASE) MCG/ACT inhaler Inhale 1-2 puffs into the lungs every 6 hours as needed for Wheezing   Shake well before each use.    clonazePAM (KLONOPIN) 0.5 MG tablet Take 0.5 mg by mouth 2 times daily as needed       topiramate (TOPAMAX) 100 MG tablet Take 100 mg by mouth 2 times daily    fluticasone (FLONASE) 50 MCG/ACT nasal spray SPRAY 1 SPRAY BY NASAL ROUTE DAILY    insulin glargine (LANTUS SOLOSTAR) 100 UNIT/ML injection pen Inject 32 Units into the skin nightly    busPIRone (BUSPAR) 30 MG tablet Take 1 tablet (30  mg total) by mouth 2 times daily    cetirizine (ZYRTEC) 10 MG tablet Take 10 mg by mouth daily    DULoxetine (CYMBALTA) 60 MG capsule Take 1 capsule (60 mg total) by mouth daily    naproxen sodium (ANAPROX) 550 MG tablet TAKE 1 TABLET (550 MG TOTAL) BY MOUTH 2 TIMES DAILY (WITH MEALS)    ranitidine (ZANTAC) 150 MG capsule Take 1 capsule (150 mg total) by mouth 2 times daily    levonorgestrel (MIRENA) 20 MCG/24HR IUD 1 each by Intrauterine route once    VENTOLIN HFA 108 (90 BASE) MCG/ACT inhaler Inhale 1-2 puffs into the lungs every 4-6 hours as needed for Wheezing    SUMAtriptan (IMITREX) 50 MG tablet Take 1 tablet (50 mg total) by mouth as needed for Migraine   Take at onset of headache. May repeat once in 2 hours.    blood glucose monitor system Brand: cheapest brand available per her insurance.  Use as directed.    ondansetron (ZOFRAN) 8 MG tablet Take 1 tablet (8 mg total) by mouth 3 times daily as needed for Nausea    famciclovir (FAMVIR) 500 MG tablet Take 1 tablet (500 mg total) by mouth 3  times daily as needed    Melatonin 5 MG CAPS Take 10 mg by mouth nightly    cyclobenzaprine (FLEXERIL) 5 MG tablet TAKE 1 TABLET (5 MG TOTAL) BY MOUTH 3 TIMES DAILY AS NEEDED FOR MUSCLE SPASMS    Non-System Medication The above patient is followed in our clinic and cannot resume work permanently.     No current facility-administered medications for this visit.        Side Effects  Patient Reported Side Effects: None reported    Mental Status  APPEARANCE: Casual  ATTITUDE TOWARD INTERVIEWER: Cooperative  MOTOR ACTIVITY: WNL (within normal limits)  EYE CONTACT: Direct  SPEECH: Normal rate and tone  AFFECT: Full Range and Appropriate  MOOD: Euthymic  THOUGHT PROCESS: Normal  THOUGHT CONTENT: No unusual themes  PERCEPTION: Within normal limits  ORIENTATION: Alert and Oriented X 3.  CONCENTRATION: Good  MEMORY:   Recent: intact   Remote: intact  COGNITIVE FUNCTION: Average intelligence  JUDGMENT: Intact  IMPULSE CONTROL: Fair  INSIGHT: Fair    Risk Assessment    Self Injury: Patient Denies  Suicidal Ideation: Patient Denies  Homicidal Ideation: Patient Denies  Aggressive Behavior: Patient Denies    Results  Results for PARASKEVI, FUNEZ (MRN [id number]) as of 11/02/2014 14:50   Ref. Range 10/08/2014 19:56   Sodium Latest Ref Range: 133 - 145 mmol/L 143   Potassium Latest Ref Range: 3.3 - 5.1 mmol/L 4.1   Chloride Latest Ref Range: 96 - 108 mmol/L 105   CO2 Latest Ref Range: 20 - 28 mmol/L 20   Anion Gap Latest Ref Range: 7 - 16  18 (H)   UN Latest Ref Range: 6 - 20 mg/dL 8   Creatinine Latest Ref Range: 0.51 - 0.95 mg/dL 0.82   GFR,Black Latest Units: * 103   GFR,Caucasian Latest Units: * 89   Glucose Latest Ref Range: 60 - 99 mg/dL 118 (H)   Calcium Latest Ref Range: 8.8 - 10.2 mg/dL 8.6 (L)   Total Protein Latest Ref Range: 6.3 - 7.7 g/dL 6.8   Albumin Latest Ref Range: 3.5 - 5.2 g/dL 4.0   Lactate Latest Ref Range: 0.5 - 2.2 mmol/L 1.8   ALT Latest Ref Range: 0 - 35 U/L 21   AST Latest Ref Range:  0 - 35 U/L 20   Alk Phos  Latest Ref Range: 35 - 105 U/L 89   Amylase Latest Ref Range: 28 - 100 U/L 30   Bilirubin,Direct Latest Ref Range: 0.0 - 0.3 mg/dL <0.2   Bilirubin,Total Latest Ref Range: 0.0 - 1.2 mg/dL <0.2   Lipase Latest Ref Range: 13 - 60 U/L 27   BHCG Latest Ref Range: 0 - 1 mIU/mL <1   WBC Latest Ref Range: 4.0 - 10.0 THOU/uL 11.7 (H)   RBC Latest Ref Range: 3.9 - 5.2 MIL/uL 4.4   Hemoglobin Latest Ref Range: 11.2 - 15.7 g/dL 11.6   Hematocrit Latest Ref Range: 34 - 45 % 37   MCV Latest Ref Range: 79 - 95 fL 85   MCH Latest Ref Range: 26 - 32 pg/cell 27   MCHC Latest Ref Range: 32 - 36 g/dL 31 (L)   RDW Latest Ref Range: 11.7 - 14.4 % 14.4   Platelets Latest Ref Range: 160 - 370 THOU/uL 345   Neut # K/uL Latest Ref Range: 1.6 - 6.1 THOU/uL 7.9 (H)   Lymph # K/uL Latest Ref Range: 1.2 - 3.7 THOU/uL 2.7   Mono # K/uL Latest Ref Range: 0.2 - 0.9 THOU/uL 0.6   Eos # K/uL Latest Ref Range: 0.0 - 0.4 THOU/uL 0.3   Baso # K/uL Latest Ref Range: 0.0 - 0.1 THOU/uL 0.1   IMM Granulocytes # Latest Ref Range: 0.0 - 0.1 THOU/uL 0.1   Nucl RBC # K/uL Latest Ref Range: 0.0 - 0.0 THOU/uL 0.0   Seg Neut % Latest Units: % 67.6   Lymphocyte % Latest Units: % 23.3   Monocyte % Latest Units: % 5.4   Eosinophil % Latest Units: % 2.4   Basophil % Latest Units: % 0.6   IMM Granulocytes Latest Units: % 0.7   Nucl RBC % Latest Ref Range: 0.0 - 0.2 /100 WBC 0.0     Results for ARVETTA, ARAQUE (MRN [id number]) as of 11/02/2014 14:50   Ref. Range 10/08/2014 19:56   Hemoglobin Latest Ref Range: 11.2 - 15.7 g/dL 11.6     BP Readings from Last 3 Encounters:   11/02/14 128/87   10/09/14 126/68   10/02/14 117/73       Assessment  FORMULATION: Patient reports that she was diagnosed with POTS.  She found out yesterday and says it has helped to improve her mood.  We discussed that now with the diagnosis, she should be able to look for warning signs and prevent some of the falls.  She was started on Atenolol and says her cardiologist is concerned about her being on the  Cogentin. Because she has the akathesia even at the lower doses of the [place] we discussed discontinuing that and starting her on Abilify to which she agreed.  Writer encouraged her to call if she has any problems with the Abilify which she agreed to do.    Recommendations/Plan and Rationale  PLAN:  Discontinue Latuda and Cogentin.  Begin Abilify 5 mg daily in the morning.  Continue other medications.  Return in 4 weeks.

## 2014-11-03 ENCOUNTER — Encounter: Payer: Self-pay | Admitting: Primary Care

## 2014-11-07 ENCOUNTER — Other Ambulatory Visit: Payer: Self-pay | Admitting: Psychiatry

## 2014-11-08 ENCOUNTER — Ambulatory Visit
Admit: 2014-11-08 | Discharge: 2014-11-08 | Disposition: A | Discharge status: Home / Self Care | Payer: Self-pay | Source: Ambulatory Visit

## 2014-11-08 ENCOUNTER — Ambulatory Visit: Payer: Self-pay | Admitting: Family Medicine

## 2014-11-08 ENCOUNTER — Other Ambulatory Visit: Payer: Self-pay | Admitting: Psychiatry

## 2014-11-08 ENCOUNTER — Encounter: Payer: Self-pay | Admitting: Gastroenterology

## 2014-11-08 ENCOUNTER — Encounter: Payer: Self-pay | Admitting: Family Medicine

## 2014-11-08 VITALS — BP 124/82 | HR 72 | Temp 98.5°F | Ht 68.11 in | Wt 295.0 lb

## 2014-11-08 DIAGNOSIS — E119 Type 2 diabetes mellitus without complications: Secondary | ICD-10-CM

## 2014-11-08 DIAGNOSIS — Z5181 Encounter for therapeutic drug level monitoring: Secondary | ICD-10-CM

## 2014-11-08 DIAGNOSIS — K219 Gastro-esophageal reflux disease without esophagitis: Secondary | ICD-10-CM

## 2014-11-08 DIAGNOSIS — IMO0002 Reserved for concepts with insufficient information to code with codable children: Secondary | ICD-10-CM

## 2014-11-08 LAB — LIPID PANEL
Chol/HDL Ratio: 6.9
Cholesterol: 234 mg/dL — AB
HDL: 34 mg/dL
LDL Calculated: 134 mg/dL
Non HDL Cholesterol: 200 mg/dL
Triglycerides: 330 mg/dL — AB

## 2014-11-08 MED ORDER — PANTOPRAZOLE SODIUM 40 MG PO TBEC *I*
40.0000 mg | DELAYED_RELEASE_TABLET | Freq: Every day | ORAL | 5 refills | Status: DC
Start: 2014-11-08 — End: 2015-04-18

## 2014-11-08 NOTE — Progress Notes (Signed)
Subjective:     Patient ID: Kaylee Harris is a 39 y.o. female.    HPI   DMII: has been titrating lantus and is up to 36 units; 10pm nite and 4 units humalog before meals  Fasting glucoses; 125, 168, 120   2 hours PP: 110-300s many times in 200+ range.     GERD: has transitioned from omeprazole to ranitidine 150mg  BID; has continuous burning in esophogus; using tums several times a day.  States "I can't take this anymore!" Denies coffee ground emesis, no changes in stools.     Soc; lives with partner.     Patient's medications, allergies, past medical, surgical, social and family histories were reviewed and updated     Review of Systems  Per above    Objective:   Physical Exam  Visit Vitals    BP 124/82 (BP Location: Right arm, Patient Position: Sitting, Cuff Size: adult)    Pulse 72    Temp 36.9 C (98.5 F) (Temporal)    Ht 1.73 m (5' 8.11")    Wt 133.8 kg (295 lb)    BMI 44.71 kg/m2     General appearance: alert, well appearing, and in no distress.  SKIN: warm, dry, good turgor. No rashes or lesions in visible areas  EYE: PERRLA, conjunctiva clear, lids normal  NECK:trachea midline, no masses, thyroid symmetrical with no lesion or nodules appreciated.  LYMPH: no cervical or supraclavicular LAD appreciated  RESPIRATORY: unlabored respiratory effort, lungs clear to auscultation, no wheezes, rales or rhonchi, symmetrical air entry.   CVS exam: normal rate, regular rhythm, normal S1, S2, no murmurs, rubs, clicks or gallops. No edema  Abdominal exam: + bowel sounds x 4 quads, soft, nontender, nondistended, no masses or hepato-splenomegaly. No guarding or rebound tenderness  Neurological: Alert and oriented x3, steady gait  Psych: alert and oriented x3, normal affect and mood, good eye contact, neatly dressed and groomed. Logical flow of thoughts and conversation.    Assessment/Plan:  DMII: vss. Continues with elevated PP readings; will increase humalog to 6 units before meals. Continue lantus at 36 units nightly  as fasting glucoses have improved.   Continue to check PP glucoses 2 hours after meals, keep log and bring to next appointment with PCP.   GERD: not tolerating transition from omeprazole to ranitadine due to extensive time using and concern for side effects. ; using up to 10 TUMS daily; will trial pantoprazole. F/u with PCP.

## 2014-11-08 NOTE — Patient Instructions (Signed)
Increase humalog to 6 units before meals; continue to check PP glucoses as well as fasting.

## 2014-11-09 ENCOUNTER — Encounter: Payer: Self-pay | Admitting: Primary Care

## 2014-11-09 LAB — HEMOGLOBIN A1C: Hemoglobin A1C: 7.7 % — ABNORMAL HIGH (ref 4.0–6.0)

## 2014-11-14 ENCOUNTER — Other Ambulatory Visit: Payer: Self-pay | Admitting: Primary Care

## 2014-11-15 MED ORDER — FREESTYLE LITE TEST VI STRP *A*
ORAL_STRIP | 2 refills | Status: DC
Start: 2014-11-15 — End: 2014-11-21

## 2014-11-15 MED ORDER — LANCETS MISC *A*
2 refills | Status: DC
Start: 2014-11-15 — End: 2017-11-10

## 2014-11-15 MED ORDER — BLOOD GLUCOSE MONITOR SYSTEM KIT *A*
PACK | 0 refills | Status: DC
Start: 2014-11-15 — End: 2017-08-19

## 2014-11-21 ENCOUNTER — Ambulatory Visit: Payer: Self-pay | Admitting: Primary Care

## 2014-11-21 ENCOUNTER — Encounter: Payer: Self-pay | Admitting: Primary Care

## 2014-11-21 VITALS — BP 124/84 | HR 72 | Temp 98.8°F | Ht 68.11 in | Wt 294.6 lb

## 2014-11-21 DIAGNOSIS — R55 Syncope and collapse: Secondary | ICD-10-CM

## 2014-11-21 DIAGNOSIS — E785 Hyperlipidemia, unspecified: Secondary | ICD-10-CM

## 2014-11-21 DIAGNOSIS — IMO0002 Reserved for concepts with insufficient information to code with codable children: Secondary | ICD-10-CM

## 2014-11-21 LAB — HM DIABETES FOOT EXAM

## 2014-11-21 MED ORDER — ATORVASTATIN CALCIUM 40 MG PO TABS *I*
40.0000 mg | ORAL_TABLET | Freq: Every day | ORAL | 5 refills | Status: DC
Start: 2014-11-21 — End: 2015-05-14

## 2014-11-21 MED ORDER — INSULIN LISPRO (HUMAN) 100 UNIT/ML SC SOPN *I*
6.0000 [IU] | PEN_INJECTOR | Freq: Three times a day (TID) | SUBCUTANEOUS | 2 refills | Status: DC
Start: 2014-11-21 — End: 2015-03-15

## 2014-11-21 MED ORDER — FREESTYLE LITE TEST VI STRP *A*
ORAL_STRIP | 11 refills | Status: DC
Start: 2014-11-21 — End: 2017-07-27

## 2014-11-21 NOTE — Progress Notes (Signed)
[facility]    SUBJECTIVE    Pt is here to discuss:    Chief Complaint   Patient presents with    Follow-up     pain, meds           1. F/u DM - Adherent with medications. Does check BGs: fastings are generally 130s-140s. Postprandial are generally 150s-200s. Denies peripheral paresthesias. Does check feet, denies foot lesions or concerns.  Lab Results   Component Value Date    HA1C 7.7 (H) 11/08/2014      Tolerating humalog with every meal, doing 6U. No hypoglycemia episodes.   Still on Lantus 36U.    2. F/u HLP - Has tolerated lipitor in the past      Lab results: 11/08/14  1455 05/11/14  1634   CHOLESTEROL 234* 240*   HDL 34 36   LDL CALCULATED 134 see below   LDL DIRECT  --  165   TRIGLYCERIDES 330* 425*   CHOL/HDL RATIO 6.9 6.7       No components found with this basename: NHLDC    3. F/u syncope - told it was POTS syndrome (although per Dr Tanna Furry notes not entirely consistent with this), has felt better since midodrine and atenolol added. Has journal today describing only 4 syncopal episodes in the last 19 days. Felt dizzy more often and was able to prevent syncope by sitting down.  Latuda also d/c-ed and transitioned to abilify since our last visit.     PMH / Family Hx / Social Hx  Patient's medications, allergies, problem list, past medical, social histories were reviewed and notable for:    Current Outpatient Prescriptions   Medication Sig Note    FREESTYLE LITE test strip Use four times daily as directed for 250.02     atorvastatin (LIPITOR) 40 MG tablet Take 1 tablet (40 mg total) by mouth daily (with dinner)     insulin lispro (HUMALOG KWIKPEN) 100 UNIT/ML injection pen Inject 6 Units into the skin 3 times daily (before meals)     blood glucose monitor system Brand: cheapest brand available per her insurance.  Use as directed.     lancets Brand Free Style Lite; Use 2 times per day as directed for blood glucose testing.     SUMAtriptan refill (IMITREX STATDOSE) 6 MG/0.5ML injection  Inject 6 mg into the skin once as needed for Migraine   May repeat once after 1 hour if needed.     pantoprazole (PROTONIX) 40 MG EC tablet Take 1 tablet (40 mg total) by mouth daily   Swallow whole. Do not crush, break, or chew.     midodrine (PROAMATINE) 5 MG tablet Take 5 mg by mouth 2 times daily     atenolol (TENORMIN) 25 MG tablet Take 25 mg by mouth daily     DULoxetine (CYMBALTA) 60 MG capsule Take 1 capsule (60 mg total) by mouth daily     clonazePAM (KLONOPIN) 0.5 MG tablet Take 1 tablet (0.5 mg total) by mouth 2 times daily as needed   Max daily dose: 1 mg     ARIPiprazole (ABILIFY) 5 MG tablet Take 1 tablet (5 mg total) by mouth every morning     Alcohol Swabs (ALCOHOL WIPES) PADS Use BID for BG check     mirtazapine (REMERON) 7.5 MG tablet Take 1 tablet (7.5 mg total) by mouth nightly     zolpidem (AMBIEN CR) 6.25 MG CR tablet Take 1 tablet (6.25 mg total) by mouth nightly as  needed for Sleep   Max daily dose: 6.25 mg Swallow whole. Do not crush, break, or chew.     dicyclomine (BENTYL) 20 MG tablet TAKE 1 TABLET (20 MG TOTAL) BY MOUTH 4 TIMES DAILY (BEFORE MEALS AND NIGHTLY)     famciclovir (FAMVIR) 500 MG tablet Take 1 tablet (500 mg total) by mouth 3 times daily as needed     promethazine (PHENERGAN) 12.5 MG tablet Take 1 tablet (12.5 mg total) by mouth every 4-6 hours as needed for Nausea     insulin pen needle (NOVOFINE) 30G X 8 MM Use 1 times a day as instructed.     albuterol HFA 108 (90 BASE) MCG/ACT inhaler Inhale 1-2 puffs into the lungs every 6 hours as needed for Wheezing   Shake well before each use.     topiramate (TOPAMAX) 100 MG tablet Take 100 mg by mouth 2 times daily     fluticasone (FLONASE) 50 MCG/ACT nasal spray SPRAY 1 SPRAY BY NASAL ROUTE DAILY     insulin glargine (LANTUS SOLOSTAR) 100 UNIT/ML injection pen Inject 32 Units into the skin nightly 11/08/2014: 36 units     busPIRone (BUSPAR) 30 MG tablet Take 1 tablet (30 mg total) by mouth 2 times daily      cetirizine (ZYRTEC) 10 MG tablet Take 10 mg by mouth daily     naproxen sodium (ANAPROX) 550 MG tablet TAKE 1 TABLET (550 MG TOTAL) BY MOUTH 2 TIMES DAILY (WITH MEALS)     ranitidine (ZANTAC) 150 MG capsule Take 1 capsule (150 mg total) by mouth 2 times daily     levonorgestrel (MIRENA) 20 MCG/24HR IUD 1 each by Intrauterine route once     VENTOLIN HFA 108 (90 BASE) MCG/ACT inhaler Inhale 1-2 puffs into the lungs every 4-6 hours as needed for Wheezing     SUMAtriptan (IMITREX) 50 MG tablet Take 1 tablet (50 mg total) by mouth as needed for Migraine   Take at onset of headache. May repeat once in 2 hours.     cyclobenzaprine (FLEXERIL) 5 MG tablet TAKE 1 TABLET (5 MG TOTAL) BY MOUTH 3 TIMES DAILY AS NEEDED FOR MUSCLE SPASMS     Non-System Medication The above patient is followed in our clinic and cannot resume work permanently.     ondansetron (ZOFRAN) 8 MG tablet Take 1 tablet (8 mg total) by mouth 3 times daily as needed for Nausea      OBJECTIVE  Vitals:    11/21/14 1556   BP: 124/84   BP Location: Left arm   Patient Position: Sitting   Cuff Size: adult   Pulse: 72   Temp: 37.1 C (98.8 F)   TempSrc: Temporal   Weight: 133.6 kg (294 lb 9.6 oz)   Height: 1.73 m (5' 8.11")     Body mass index is 44.65 kg/(m^2).      General: well-appearing obese Caucasian female, pleasant & conversant, in NAD.  Psych: AAOx3, improved affect, smiling, well groomed, recent haircut. Prepared with journals with BGs and fainting episodes.  Feet: No evidence of injury, infection or ischemia b/l. DP and TP pulses 2+ b/l. Sensation to monofilament testing is intact.        ASSESSMENT & PLAN  1. DM (diabetes mellitus), type 2, uncontrolled  Tolerating humalog in addition to lantus. No hypoglycemia episodes. Reviewed BG log, suspect improvement given numbers. Reviewed goals (fasting <125, pp <140). Encouraged to monitor what meals get her closer to pp goal of 140. F/u 1 month  with readings, consider dose increase of humalog at that  time if consistently >140. Reviewed that her current A1c is unlikely reflecting changes given humalog only on board x28mo  - Hemoglobin A1c; Future    [facility] Diabetes Plan    According to current ADA guidelines the patient A1C goal is: less than 7  The patient's last A1C was 11/08/2014: Hemoglobin A1C 7.7 %* (High; Ref range: 4.0 - 6.0 %), the patient is: above goal.  Diabetic foot exam was performed:  Yes  The plan to reach goal   1.  Reviewed pt's understanding of medications including barriers to adherence    2.  Recommended lifestyle modifications: weight reduction, diet improvements, glucose monitoring and medication compliance   3.  The patient understands their clinical goals and will undertake self-management recommendations including: all   4.  Medications Management: no changes made  Given recent introduction of humalog   5.  Referral to Care Management:: No   6.  Patient Ed/Self Management tools provided: Current self-management tools adequate              2. Hyperlipidemia, unspecified hyperlipidemia type  Has tolerated statin in the past. Recommended mode dose. Reviewed common SE. Repeat labs in 3 mos  - Lipid add Rfx to Drt LDL if Trig >400; Future  - ALT; Future    3. Syncope, unspecified syncope type  Improving with POTs diagnosis and current meds. Continue to follow with cards.          Follow-up: 3 mos      [name], MD  [facility]  11/21/2014  4:06 PM        ______________________

## 2014-11-22 ENCOUNTER — Ambulatory Visit: Payer: Self-pay

## 2014-11-22 ENCOUNTER — Encounter: Payer: Self-pay | Admitting: Primary Care

## 2014-11-22 DIAGNOSIS — F329 Major depressive disorder, single episode, unspecified: Secondary | ICD-10-CM

## 2014-11-22 NOTE — Progress Notes (Signed)
\  Behavioral Health Progress Note     LENGTH OF SESSION: 45 minutes    Contact Type:  Location: On Site    Face to Face     Problem(s)/Goals Addressed from Treatment Plan:    Problem 1:   R PSY TP PROBLEM 09/28/2014   1ST TREATMENT PLAN PROBLEM Depression and anxiety       Goal for this problem:    R PSY TP GOAL 09/28/2014   1ST TREATMENT PLAN GOAL Learn DBT and PST skills to manage affect       Progress towards this goal: Problem resolving. Comment Patient reports working to manage stressors without self harm.    Mental Status Exam:  APPEARANCE: Appears stated age, Well-groomed, Casual  ATTITUDE TOWARD INTERVIEWER: Cooperative  MOTOR ACTIVITY: WNL (within normal limits)  EYE CONTACT: Direct  SPEECH: Normal rate and tone  AFFECT: Full Range  MOOD: Depressed and Neutral  THOUGHT PROCESS: Circumstantial  THOUGHT CONTENT: No unusual themes and Negative Rumination  PERCEPTION: No evidence of hallucinations  CURRENT SUICIDAL IDEATION: Vague/passive suicidal ideation  CURRENT HOMICIDAL IDEATION: Patient denies  ORIENTATION: Alert and Oriented X 3.  CONCENTRATION: Good  MEMORY:   Recent: intact   Remote: intact  COGNITIVE FUNCTION: Average intelligence  JUDGMENT: Intact  IMPULSE CONTROL: Fair  INSIGHT: Fair    Risk Assessment:  ASSESSMENT OF RISK FOR SUICIDAL BEHAVIOR  Changes in risk for suicide from baseline Formulation of Risk and/or previous intake, including newly identified risk, if any: none  Violence risk was assessed and No Change noted from baseline formulation of risk and/or previous assessment.    Session Content::Patient reports working to manage stressors without self harm. She said that her mother comes over "screaming" about her SIL's treatment of the kids and she is trying to stay out of it but when mother was complaining about SIL she reinforced this with negative thoughts of her own about her SIL. Writer validated patient's thoughts and Investment banker, operational and patient discussed patient working to set limits  with her mother and maintain an emotional boundary. Patient said that she and her wife had an extreme argument last night after patient fell. She said that this was around her wanting to go to the doctor to check out her arm which hurt a great deal and her wife became angry about her "always" having to seek medical care. Patient said that this led to an argument about their level of intimacy right now. Patient said that this was when her thoughts turned to suicide, but her medications are currently locked up. Writer talked to patient about developing alternative thoughts to suicide and patient said that she would work on this. Patient said that she utilized skills of activities, music, coloring. Writer and patient discussed marriage, couples' counseling, etc. Patient said that she would talk to her spouse again about going to counseling together. Writer discussed [facility] intervention with patient and patient will work on [employer] for planning an intervention with mother and wife.    Interventions:  Dialectical Behavioral Therapy (DBT), Supportive Psychotherapy, Taught/practiced coping skills (specify skills used):  distract, activities, other thoughts, opposite emotion    Treatment plan is updated      Plan:  Psychotherapy continues as described in care plan; plan remains the same.    NEXT APPT: 12/07/14 @2       Tresa Res, LCSW

## 2014-11-24 ENCOUNTER — Other Ambulatory Visit: Payer: Self-pay | Admitting: Psychiatry

## 2014-11-24 MED ORDER — BUSPIRONE HCL 30 MG PO TABS *I*
30.0000 mg | ORAL_TABLET | Freq: Two times a day (BID) | ORAL | 2 refills | Status: DC
Start: 2014-11-24 — End: 2015-02-16

## 2014-11-25 ENCOUNTER — Encounter: Payer: Self-pay | Admitting: Vascular Neurology

## 2014-11-25 ENCOUNTER — Ambulatory Visit: Payer: Self-pay | Admitting: Vascular Neurology

## 2014-11-25 VITALS — BP 120/61 | HR 82 | Ht 68.0 in | Wt 294.0 lb

## 2014-11-25 DIAGNOSIS — R55 Syncope and collapse: Secondary | ICD-10-CM
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
## 2014-11-29 ENCOUNTER — Other Ambulatory Visit: Payer: Self-pay | Admitting: Psychiatry

## 2014-11-30 ENCOUNTER — Ambulatory Visit: Payer: Self-pay | Admitting: Psychiatry

## 2014-11-30 ENCOUNTER — Encounter: Payer: Self-pay | Admitting: Psychiatry

## 2014-11-30 VITALS — BP 122/80 | HR 89 | Resp 18 | Ht 67.99 in | Wt 295.0 lb

## 2014-11-30 DIAGNOSIS — F429 Obsessive-compulsive disorder, unspecified: Secondary | ICD-10-CM

## 2014-11-30 DIAGNOSIS — F4310 Post-traumatic stress disorder, unspecified: Secondary | ICD-10-CM

## 2014-11-30 DIAGNOSIS — F339 Major depressive disorder, recurrent, unspecified: Secondary | ICD-10-CM

## 2014-11-30 MED ORDER — ARIPIPRAZOLE 15 MG PO TABS *I*
ORAL_TABLET | ORAL | 1 refills | Status: DC
Start: 2014-11-30 — End: 2015-01-23

## 2014-11-30 MED ORDER — CLONAZEPAM 0.5 MG PO TABS *I*
0.5000 mg | ORAL_TABLET | Freq: Two times a day (BID) | ORAL | 0 refills | Status: DC | PRN
Start: 2014-11-30 — End: 2016-08-13

## 2014-11-30 NOTE — Progress Notes (Signed)
Behavioral Health Psychopharmacology Follow-up     Length of Session: 20 minutes.    Diagnosis Addressed    ICD-10-CM ICD-9-CM   1. Major depression, recurrent F33.9 296.30   2. PTSD (post-traumatic stress disorder) F43.10 309.81   3. OCD (obsessive compulsive disorder) F42 300.3       Recent History and Response to Medications  Patient states:   HPI     Follow-up    Additional comments: patient is here to follow up on her medications       Last edited by Shirlee More, LPN on 08/12/8117  1:47 PM. (History)          Current use of alcohol or drugs: No        Neurovegetative Symptoms Review:  Energy level: fair  Concentration: fair  Sleep Quality: good   Number of hours :  (6-8)  Appetite: good     Wt Readings from Last 3 Encounters:   11/30/14 133.8 kg (295 lb)   11/25/14 133.4 kg (294 lb)   11/21/14 133.6 kg (294 lb 9.6 oz)     Enjoyment/interest: poor        Current Medications  Current Outpatient Prescriptions   Medication Sig    busPIRone (BUSPAR) 30 MG tablet Take 1 tablet (30 mg total) by mouth 2 times daily    FREESTYLE LITE test strip Use four times daily as directed for 250.02    atorvastatin (LIPITOR) 40 MG tablet Take 1 tablet (40 mg total) by mouth daily (with dinner)    insulin lispro (HUMALOG KWIKPEN) 100 UNIT/ML injection pen Inject 6 Units into the skin 3 times daily (before meals)    blood glucose monitor system Brand: cheapest brand available per her insurance.  Use as directed.    lancets Brand Free Style Lite; Use 2 times per day as directed for blood glucose testing.    pantoprazole (PROTONIX) 40 MG EC tablet Take 1 tablet (40 mg total) by mouth daily   Swallow whole. Do not crush, break, or chew.    midodrine (PROAMATINE) 5 MG tablet Take 5 mg by mouth 2 times daily    atenolol (TENORMIN) 25 MG tablet Take 25 mg by mouth daily    DULoxetine (CYMBALTA) 60 MG capsule Take 1 capsule (60 mg total) by mouth daily    clonazePAM (KLONOPIN) 0.5 MG tablet Take 1 tablet (0.5 mg total) by  mouth 2 times daily as needed   Max daily dose: 1 mg    ARIPiprazole (ABILIFY) 5 MG tablet Take 1 tablet (5 mg total) by mouth every morning    Alcohol Swabs (ALCOHOL WIPES) PADS Use BID for BG check    mirtazapine (REMERON) 7.5 MG tablet Take 1 tablet (7.5 mg total) by mouth nightly    zolpidem (AMBIEN CR) 6.25 MG CR tablet Take 1 tablet (6.25 mg total) by mouth nightly as needed for Sleep   Max daily dose: 6.25 mg Swallow whole. Do not crush, break, or chew.    dicyclomine (BENTYL) 20 MG tablet TAKE 1 TABLET (20 MG TOTAL) BY MOUTH 4 TIMES DAILY (BEFORE MEALS AND NIGHTLY)    famciclovir (FAMVIR) 500 MG tablet Take 1 tablet (500 mg total) by mouth 3 times daily as needed    promethazine (PHENERGAN) 12.5 MG tablet Take 1 tablet (12.5 mg total) by mouth every 4-6 hours as needed for Nausea    insulin pen needle (NOVOFINE) 30G X 8 MM Use 1 times a day as instructed.    albuterol HFA  108 (90 BASE) MCG/ACT inhaler Inhale 1-2 puffs into the lungs every 6 hours as needed for Wheezing   Shake well before each use.    topiramate (TOPAMAX) 100 MG tablet Take 100 mg by mouth 2 times daily    fluticasone (FLONASE) 50 MCG/ACT nasal spray SPRAY 1 SPRAY BY NASAL ROUTE DAILY    insulin glargine (LANTUS SOLOSTAR) 100 UNIT/ML injection pen Inject 32 Units into the skin nightly (Patient taking differently: Inject 36 Units into the skin nightly   )    cetirizine (ZYRTEC) 10 MG tablet Take 10 mg by mouth daily    naproxen sodium (ANAPROX) 550 MG tablet TAKE 1 TABLET (550 MG TOTAL) BY MOUTH 2 TIMES DAILY (WITH MEALS)    levonorgestrel (MIRENA) 20 MCG/24HR IUD 1 each by Intrauterine route once    VENTOLIN HFA 108 (90 BASE) MCG/ACT inhaler Inhale 1-2 puffs into the lungs every 4-6 hours as needed for Wheezing    SUMAtriptan (IMITREX) 50 MG tablet Take 1 tablet (50 mg total) by mouth as needed for Migraine   Take at onset of headache. May repeat once in 2 hours.    cyclobenzaprine (FLEXERIL) 5 MG tablet TAKE 1 TABLET (5 MG  TOTAL) BY MOUTH 3 TIMES DAILY AS NEEDED FOR MUSCLE SPASMS    Non-System Medication The above patient is followed in our clinic and cannot resume work permanently.    SUMAtriptan refill (IMITREX STATDOSE) 6 MG/0.5ML injection Inject 6 mg into the skin once as needed for Migraine   May repeat once after 1 hour if needed.    ondansetron (ZOFRAN) 8 MG tablet Take 1 tablet (8 mg total) by mouth 3 times daily as needed for Nausea     No current facility-administered medications for this visit.        Side Effects  Patient Reported Side Effects: None reported    Mental Status  APPEARANCE: Casual  ATTITUDE TOWARD INTERVIEWER: Cooperative  MOTOR ACTIVITY: WNL (within normal limits)  EYE CONTACT: Direct  SPEECH: Normal rate and tone  AFFECT: Full Range and Appropriate  MOOD: Anxious and Depressed  THOUGHT PROCESS: Normal  THOUGHT CONTENT: No unusual themes  PERCEPTION: Hallucinations Auditory and "just that lovely music."  ORIENTATION: Alert and Oriented X 3.  CONCENTRATION: Good and Fair  MEMORY:   Recent: intact   Remote: intact  COGNITIVE FUNCTION: Average intelligence  JUDGMENT: Intact  IMPULSE CONTROL: Fair  INSIGHT: Fair    Risk Assessment    Self Injury: Patient Denies  Suicidal Ideation: Patient Denies  Homicidal Ideation: Patient Denies  Aggressive Behavior: Patient Denies    Results  Results for Kaylee, Harris (MRN [id number]) as of 11/30/2014 15:37   Ref. Range 11/08/2014 14:55   Cholesterol Latest Units: mg/dL 234 (A)   Triglycerides Latest Units: mg/dL 330 (A)   HDL Latest Units: mg/dL 34   LDL Calculated Latest Units: mg/dL 134   Non HDL Cholesterol Latest Units: mg/dL 200   Chol/HDL Ratio Unknown 6.9   Hemoglobin A1C Latest Ref Range: 4.0 - 6.0 % 7.7 (H)   All are decreasing slightly from previous blood work and patient was placed on lipitor.     BP Readings from Last 3 Encounters:   11/30/14 122/80   11/25/14 120/61   11/21/14 124/84       Assessment  FORMULATION: Patient reports that her mood is a little better  on the Abilify.  "I like it better than the Latuda."  She says she feels a little lighter and  her affect is a little brighter today.  She is passing out less with her new medication.  She says instead of passing out every day it only has happened 9 times in the past month.  Patient says she would like to try going up a little on the Abilify.  Her scores are still in the moderate range so writer was in agreement.   Recommendations/Plan and Rationale  PLAN: Increase Abilify to 7.5 mg daily in the morning.  Continue other medications,.  Return in 4 weeks.

## 2014-12-01 ENCOUNTER — Telehealth: Payer: Self-pay

## 2014-12-01 NOTE — Telephone Encounter (Signed)
Patient just called back - she is all set, pharmacy does have her meds.

## 2014-12-01 NOTE — Telephone Encounter (Addendum)
Patient called because her pharmacy stated that they ever received patients prescription for ARIPiprazole (ABILIFY) 15 MG tablet. Writer advised patient that it was ordered already but agreed to send message due to patient having to rush off the phone. Patient can be reached at [phone]. Writer asked to get patient over to med hub but patient had to get off phone.

## 2014-12-01 NOTE — Telephone Encounter (Signed)
Patient checked with [facility], her meds are all set, no need to follow up.    (Patient called earlier, not sure who took message but she is all set.)

## 2014-12-07 ENCOUNTER — Ambulatory Visit: Payer: Self-pay

## 2014-12-07 DIAGNOSIS — F329 Major depressive disorder, single episode, unspecified: Secondary | ICD-10-CM

## 2014-12-07 NOTE — Progress Notes (Signed)
Behavioral Health Progress Note     LENGTH OF SESSION: 45 minutes    Contact Type:  Location: On Site    Face to Face     Problem(s)/Goals Addressed from Treatment Plan:    Problem 1:   R PSY TP PROBLEM 09/28/2014   1ST TREATMENT PLAN PROBLEM Depression and anxiety       Goal for this problem:    R PSY TP GOAL 09/28/2014   1ST TREATMENT PLAN GOAL Learn DBT and PST skills to manage affect       Progress towards this goal: Problem resolving. Comment Patient reports continued work on interpersonal relationships within the family system.    Mental Status Exam:  APPEARANCE: Appears stated age, Well-groomed, Casual  ATTITUDE TOWARD INTERVIEWER: Cooperative  MOTOR ACTIVITY: WNL (within normal limits)  EYE CONTACT: Direct and Indirect  SPEECH: Normal rate and tone  AFFECT: Full Range  MOOD: Anxious and Depressed  THOUGHT PROCESS: Circumstantial  THOUGHT CONTENT: Negative Rumination  PERCEPTION: No evidence of hallucinations  CURRENT SUICIDAL IDEATION: patient denies  CURRENT HOMICIDAL IDEATION: Patient denies  ORIENTATION: Alert and Oriented X 3.  CONCENTRATION: Good  MEMORY:   Recent: intact   Remote: intact  COGNITIVE FUNCTION: Average intelligence  JUDGMENT: Intact  IMPULSE CONTROL: Fair  INSIGHT: Fair    Risk Assessment:  ASSESSMENT OF RISK FOR SUICIDAL BEHAVIOR  Changes in risk for suicide from baseline Formulation of Risk and/or previous intake, including newly identified risk, if any: none  Violence risk was assessed and No Change noted from baseline formulation of risk and/or previous assessment.    Session Content:: Patient reports continued work on interpersonal relationships within the family system. Patient talked about a recent text from her SIL's where patient was acting as the go between for her SIL and her aunt. Patient said that her SIL was being a "witch" and read her texts to Probation officer. Writer pointed out that patient was assigning emotions to the words that were not there. Writer asked patient who she was  really mad at and patient identified her brother. Patient talked about her distress over her father's health. Writer validated patient's thoughts and feelings and reinforced use of skills to manage emotion mind. Patient said that she and her wife have had some communication issues and Probation officer had patient bring her wife in to discuss them engaging in couples' counseling.     Interventions:  Dialectical Behavioral Therapy (DBT), Supportive Psychotherapy, Taught/practiced communication skills (specify skills used):  interpersonal, Treatment Planning    Current Treatment Plan   Created/Updated On 09/28/2014   Extends to 12/27/2014       Plan:  Psychotherapy continues as described in care plan; plan remains the same.    NEXT APPT: 12/28/14 @2 :[place], LCSW

## 2014-12-12 ENCOUNTER — Other Ambulatory Visit: Payer: Self-pay

## 2014-12-12 MED ORDER — VENTOLIN HFA 108 (90 BASE) MCG/ACT IN AERS
1.0000 | INHALATION_SPRAY | RESPIRATORY_TRACT | 5 refills | Status: DC | PRN
Start: 2014-12-12 — End: 2016-01-27

## 2014-12-13 ENCOUNTER — Other Ambulatory Visit: Payer: Self-pay | Admitting: Psychiatry

## 2014-12-14 ENCOUNTER — Other Ambulatory Visit: Payer: Self-pay | Admitting: Psychiatry

## 2014-12-14 MED ORDER — ZOLPIDEM TARTRATE 6.25 MG PO TBCR *A*
6.2500 mg | ORAL_TABLET | Freq: Every evening | ORAL | 2 refills | Status: DC | PRN
Start: 2014-12-14 — End: 2015-03-14

## 2014-12-20 ENCOUNTER — Other Ambulatory Visit: Payer: Self-pay | Admitting: Psychiatry

## 2014-12-20 MED ORDER — MIRTAZAPINE 7.5 MG PO TABS *A*
7.5000 mg | ORAL_TABLET | Freq: Every evening | ORAL | 1 refills | Status: DC
Start: 2014-12-20 — End: 2015-02-13

## 2014-12-26 ENCOUNTER — Encounter: Payer: Self-pay | Admitting: Primary Care

## 2014-12-28 ENCOUNTER — Ambulatory Visit: Payer: Self-pay | Admitting: Psychiatry

## 2014-12-28 ENCOUNTER — Encounter: Payer: Self-pay | Admitting: Psychiatry

## 2014-12-28 ENCOUNTER — Ambulatory Visit: Payer: Self-pay

## 2014-12-28 VITALS — BP 141/70 | HR 85 | Ht 68.0 in | Wt 297.0 lb

## 2014-12-28 DIAGNOSIS — G90A Postural orthostatic tachycardia syndrome (POTS): Secondary | ICD-10-CM

## 2014-12-28 DIAGNOSIS — Z658 Other specified problems related to psychosocial circumstances: Secondary | ICD-10-CM

## 2014-12-28 DIAGNOSIS — F429 Obsessive-compulsive disorder, unspecified: Secondary | ICD-10-CM

## 2014-12-28 DIAGNOSIS — F4310 Post-traumatic stress disorder, unspecified: Secondary | ICD-10-CM

## 2014-12-28 DIAGNOSIS — F3341 Major depressive disorder, recurrent, in partial remission: Secondary | ICD-10-CM

## 2014-12-28 DIAGNOSIS — Z8659 Personal history of other mental and behavioral disorders: Secondary | ICD-10-CM

## 2014-12-28 DIAGNOSIS — F603 Borderline personality disorder: Secondary | ICD-10-CM

## 2014-12-28 DIAGNOSIS — I498 Other specified cardiac arrhythmias: Secondary | ICD-10-CM

## 2014-12-28 DIAGNOSIS — F329 Major depressive disorder, single episode, unspecified: Secondary | ICD-10-CM

## 2014-12-28 NOTE — Progress Notes (Signed)
Behavioral Health Psychopharmacology Follow-up     Length of Session: 20 minutes.    Diagnosis Addressed    ICD-10-CM ICD-9-CM   1. MDD (major depressive disorder) F32.9 296.20   2. PTSD (post-traumatic stress disorder) F43.10 309.81   3. OCD (obsessive compulsive disorder) F42 300.3       Recent History and Response to Medications  Patient states:   HPI     Follow-up    Additional comments: patient here for medication evaluation       Last edited by Rickard Rhymes, RN on 12/28/2014  2:08 PM. (History)          Current use of alcohol or drugs: No        Neurovegetative Symptoms Review:  Energy level: fair  Concentration: fair  Sleep Quality: good   Number of hours : 9  Appetite: good (trying to eat 3 balanced meals per day)     Wt Readings from Last 3 Encounters:   12/28/14 134.7 kg (297 lb)   11/30/14 133.8 kg (295 lb)   11/25/14 133.4 kg (294 lb)     Enjoyment/interest: fair        Current Medications  Current Outpatient Prescriptions   Medication Sig    mirtazapine (REMERON) 7.5 MG tablet Take 1 tablet (7.5 mg total) by mouth nightly    zolpidem (AMBIEN CR) 6.25 MG CR tablet Take 1 tablet (6.25 mg total) by mouth nightly as needed for Sleep   Max daily dose: 6.25 mg Swallow whole. Do not crush, break, or chew.    VENTOLIN HFA 108 (90 BASE) MCG/ACT inhaler Inhale 1-2 puffs into the lungs every 4-6 hours as needed for Wheezing    ARIPiprazole (ABILIFY) 15 MG tablet 1/2 tablet daily in the morning.    busPIRone (BUSPAR) 30 MG tablet Take 1 tablet (30 mg total) by mouth 2 times daily    FREESTYLE LITE test strip Use four times daily as directed for 250.02    atorvastatin (LIPITOR) 40 MG tablet Take 1 tablet (40 mg total) by mouth daily (with dinner)    insulin lispro (HUMALOG KWIKPEN) 100 UNIT/ML injection pen Inject 6 Units into the skin 3 times daily (before meals)    blood glucose monitor system Brand: cheapest brand available per her insurance.  Use as directed.    lancets Brand Free Style Lite; Use 2  times per day as directed for blood glucose testing.    pantoprazole (PROTONIX) 40 MG EC tablet Take 1 tablet (40 mg total) by mouth daily   Swallow whole. Do not crush, break, or chew.    midodrine (PROAMATINE) 5 MG tablet Take 5 mg by mouth 2 times daily    atenolol (TENORMIN) 25 MG tablet Take 25 mg by mouth daily    DULoxetine (CYMBALTA) 60 MG capsule Take 1 capsule (60 mg total) by mouth daily    Alcohol Swabs (ALCOHOL WIPES) PADS Use BID for BG check    dicyclomine (BENTYL) 20 MG tablet TAKE 1 TABLET (20 MG TOTAL) BY MOUTH 4 TIMES DAILY (BEFORE MEALS AND NIGHTLY)    insulin pen needle (NOVOFINE) 30G X 8 MM Use 1 times a day as instructed.    topiramate (TOPAMAX) 100 MG tablet Take 100 mg by mouth 2 times daily    fluticasone (FLONASE) 50 MCG/ACT nasal spray SPRAY 1 SPRAY BY NASAL ROUTE DAILY    insulin glargine (LANTUS SOLOSTAR) 100 UNIT/ML injection pen Inject 32 Units into the skin nightly (Patient taking differently: Inject 36 Units  into the skin nightly   )    cetirizine (ZYRTEC) 10 MG tablet Take 10 mg by mouth daily    naproxen sodium (ANAPROX) 550 MG tablet TAKE 1 TABLET (550 MG TOTAL) BY MOUTH 2 TIMES DAILY (WITH MEALS)    levonorgestrel (MIRENA) 20 MCG/24HR IUD 1 each by Intrauterine route once    Non-System Medication The above patient is followed in our clinic and cannot resume work permanently.    clonazePAM (KLONOPIN) 0.5 MG tablet Take 1 tablet (0.5 mg total) by mouth 2 times daily as needed   Max daily dose: 1 mg    SUMAtriptan refill (IMITREX STATDOSE) 6 MG/0.5ML injection Inject 6 mg into the skin once as needed for Migraine   May repeat once after 1 hour if needed.    famciclovir (FAMVIR) 500 MG tablet Take 1 tablet (500 mg total) by mouth 3 times daily as needed    promethazine (PHENERGAN) 12.5 MG tablet Take 1 tablet (12.5 mg total) by mouth every 4-6 hours as needed for Nausea    SUMAtriptan (IMITREX) 50 MG tablet Take 1 tablet (50 mg total) by mouth as needed for  Migraine   Take at onset of headache. May repeat once in 2 hours.    cyclobenzaprine (FLEXERIL) 5 MG tablet TAKE 1 TABLET (5 MG TOTAL) BY MOUTH 3 TIMES DAILY AS NEEDED FOR MUSCLE SPASMS    ondansetron (ZOFRAN) 8 MG tablet Take 1 tablet (8 mg total) by mouth 3 times daily as needed for Nausea     No current facility-administered medications for this visit.        Side Effects  Patient Reported Side Effects: None reported    Mental Status  APPEARANCE: Casual  ATTITUDE TOWARD INTERVIEWER: Cooperative  MOTOR ACTIVITY: WNL (within normal limits)  EYE CONTACT: Direct  SPEECH: Normal rate and tone  AFFECT: Full Range and Appropriate  MOOD: Euthymic  THOUGHT PROCESS: Normal  THOUGHT CONTENT: No unusual themes  PERCEPTION: Within normal limits  ORIENTATION: Alert and Oriented X 3.  CONCENTRATION: Good  MEMORY:   Recent: intact   Remote: intact  COGNITIVE FUNCTION: Average intelligence  JUDGMENT: Intact  IMPULSE CONTROL: Good  INSIGHT: Good and Fair    Risk Assessment    Self Injury: Patient Denies  Suicidal Ideation: Patient Denies  Homicidal Ideation: Patient Denies  Aggressive Behavior: Patient Denies    Results  none    BP Readings from Last 3 Encounters:   12/28/14 141/70   11/30/14 122/80   11/25/14 120/61       Assessment  FORMULATION: Patient reports she has not been using the Klonopin for the last two weeks.  She has notice she is anxious at times and is clenching her teeth more.  Patient says she thinks that the increase in Abilify has been helpful.  She says "I seem happier with it."   Patient talked some about her wife's upcoming birthday and anticipating having to be with her wife's father.  Writer encouraged her not to do that and she said she was going to talk with her wife about ti.  We discussed not making any changes and Probation officer reinforced patient for minimizing her Klonopin use.      Recommendations/Plan and Rationale  PLAN: Continue current medications.  Return in 8 weeks.

## 2014-12-28 NOTE — Progress Notes (Signed)
Behavioral Health Progress Note     LENGTH OF SESSION: 45 minutes    Contact Type:  Location: On Site    Face to Face     Problem(s)/Goals Addressed from Treatment Plan:    Problem 1:   R PSY TP PROBLEM 09/28/2014   1ST TREATMENT PLAN PROBLEM Depression and anxiety       Goal for this problem:    R PSY TP GOAL 09/28/2014   1ST TREATMENT PLAN GOAL Learn DBT and PST skills to manage affect       Progress towards this goal: Problem resolving. Comment Patient reports continued efforts at limit setting.    Mental Status Exam:  APPEARANCE: Appears stated age, Well-groomed, Casual  ATTITUDE TOWARD INTERVIEWER: Cooperative  MOTOR ACTIVITY: WNL (within normal limits)  EYE CONTACT: Direct  SPEECH: Normal rate and tone  AFFECT: Happy and Neutral  MOOD: Lively and Neutral  THOUGHT PROCESS: Normal and Circumstantial  THOUGHT CONTENT: No unusual themes and Negative Rumination  PERCEPTION: Within normal limits and No evidence of hallucinations  CURRENT SUICIDAL IDEATION: patient denies  CURRENT HOMICIDAL IDEATION: Patient denies  ORIENTATION: Alert and Oriented X 3.  CONCENTRATION: Good  MEMORY:   Recent: intact   Remote: intact  COGNITIVE FUNCTION: Average intelligence  JUDGMENT: Intact  IMPULSE CONTROL: Good  INSIGHT: Fair    Risk Assessment:  ASSESSMENT OF RISK FOR SUICIDAL BEHAVIOR  Changes in risk for suicide from baseline Formulation of Risk and/or previous intake, including newly identified risk, if any: none  Violence risk was assessed and No Change noted from baseline formulation of risk and/or previous assessment.    Session Content:: Patient reports continued efforts at limit setting. She said that it is her wife's birthday this Friday and her MIL has several plans that will require patient to spend time with her FIL. Writer and patient discussed patient balancing her need to celebrate with her wife and spending time with her FIL and what activities she felt she could tolerate or choose to not attend. Writer and patient  discussed effective communication techniques to discuss the matter with her wife. Patient said that she is trying to stay in the moment with regard to her father's health although this is hard. Patient told writer that her episodes of loss of consciousness have decreased by almost half. Writer commended patient on her progress and reinforced patient's use of skills.      Interventions:  Dialectical Behavioral Therapy (DBT), Supportive Psychotherapy, Taught/practiced communication skills (specify skills used):  interpersonal    Current Treatment Plan   Created/Updated On 09/28/2014   Extends to 12/27/2014         Plan:  Psychotherapy continues as described in care plan; plan remains the same.    NEXT APPT: 01/19/15 @2       Tresa Res, LCSW

## 2014-12-30 NOTE — BH Treatment Plan (Signed)
[facility] Treatment Plan     Date of Plan:   R PSY TP DATE FROM 12/30/2014   FROM 12/30/2014      Created/Updated On 12/30/2014   Extends to 03/31/2015       Diagnostic Impression    ICD-10-CM ICD-9-CM   1. 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 History of posttraumatic stress disorder (PTSD) Z86.59 V11.8   3. OCD (obsessive compulsive disorder) F42 300.3   4. Borderline personality disorder F60.3 301.83   5. POTS (postural orthostatic tachycardia syndrome) R00.0 427.89    I95.1    6. Morbid obesity E66.01 278.01   7. Interpersonal problem Z65.8 V62.81       Strengths  Strengths derived from the assessment include: Patient is hard working, has supports.    Problem Areas  *At least one problem must be targeted toward risk reduction if Formulation of Risk or any other previous exam indicated special monitoring or intervention for suicide and/or violence risk indicated.    PROBLEM AREAS (choose and describe relevant):  THOUGHT: Negative  MOOD:Depressed and anxious  BEHAVIOR: Reactive and avoidant  ECONOMIC:SSDI  ________________________________________________________________  R PSY TP PROBLEM 12/30/2014   1ST TREATMENT PLAN PROBLEM Depression and anxiety        R PSY TP GOAL 12/30/2014   1ST TREATMENT PLAN GOAL Learn DBT and PST skills to manage affect       The rationale for addressing this problem is that resolving it will (select all that apply):  Reduce symptoms of disorder, Reduce functional impairment associated with disorder, Decrease likelihood of hospitalization, Facilitate transfer skills learned in therapy to everday life, Is a key motivational factor for the patient's participation in treatment, Reduce risk for suicide* and Reduce risk for violence*      Progress toward goal(s): Problem resolving. Comment Patient has worked to resolve communication issues within her family system. Patient continues to work to set limits within her relationships.  Patient reports a balanced mood most days.    1 a. Measurable Objectives : Patient will make and keep regular individual therapy appointments.   Date established: 05/31/14   Target date: 03/31/15   Attained or Revised? continue    1 b. Measurable Objectives : Patient will continue to learn and utilize skills to manage affect.   Date established: 05/31/14   Target date: 03/31/15   Attained or Revised? continue    R PSY TP PROBLEM 2 09/28/2014   2ND TREATMENT PLAN PROBLEM Negative coping strategies       R PSY TP GOAL 2 09/28/2014   2ND TREATMENT PLAN GOAL Utilize adaptive coping strategies       The rationale for addressing this problem is that resolving it will (select all that apply):  Reduce symptoms of disorder, Reduce functional impairment associated with disorder, Decrease likelihood of hospitalization, Facilitate transfer skills learned in therapy to everday life, Is a key motivational factor for the patient's participation in treatment, Reduce risk for suicide* and Reduce risk for violence*    Progress toward goal(s): Problem resolving. Comment Patient has worked to identify triggers to behaviors and is utilizing skills learned in group and individual therapy.    2 a. Measurable Objectives : Patient will utilize skills to identify triggers to maladaptive behaviors   Date established: 05/31/14   Target date: 03/31/15   Attained or Revised? continue    2 b. Measurable Objectives : Patient will work to replace maladaptive behaviors with effective behaviors.  Date established: 05/31/14   Target date: 03/31/15   Attained or Revised? continue    ______________________________________________________________________      Plan  TREATMENT MODALITIES:  Individual psychotherapy for 30-60 min Q 1-3 weeks with Tresa Res, LCSW.  Psychopharmacology visits 20-45 min Q 2-8 weeks with [name], NP.     DISCHARGE CRITERIA for this treatment setting: Patient reports a reduction in symptoms as evidenced by PHQ-9 and GAD-7  scores.    Clinician's name: Tresa Res, LCSW    Psychiatrist's Name: Kerri Perches, MD      Patient/Family Statement  PATIENT/FAMILY STATEMENT:  Obtain patient and family input into the treatment plan, including areas of agreement / disagreement.  Obtain patient's signature - if not possible, briefly describe the reason.     Patient Comments:          I HAVE PARTICIPATED IN THE DEVELOPMENT OF THIS TREATMENT PLAN AND I AGREE WITH ITS CONTENTS:       Patient Signature: _______________________________________________________    Date: ______________________________

## 2015-01-09 ENCOUNTER — Other Ambulatory Visit: Payer: Self-pay | Admitting: Psychiatry

## 2015-01-11 ENCOUNTER — Other Ambulatory Visit: Payer: Self-pay

## 2015-01-11 ENCOUNTER — Encounter: Payer: Self-pay | Admitting: Gastroenterology

## 2015-01-11 NOTE — Progress Notes (Signed)
Pt responded by email, does not feel she needs f/u at this time

## 2015-01-11 NOTE — Progress Notes (Signed)
Call to pt for [facility] f/u, no answer, left name and number for call back, will also send My Chart Message

## 2015-01-18 ENCOUNTER — Ambulatory Visit: Payer: Self-pay | Admitting: Registered Nurse

## 2015-01-18 ENCOUNTER — Ambulatory Visit
Admission: RE | Admit: 2015-01-18 | Discharge: 2015-01-18 | Disposition: A | Discharge status: Home / Self Care | Payer: Self-pay | Source: Ambulatory Visit | Admitting: Nutrition

## 2015-01-18 DIAGNOSIS — E119 Type 2 diabetes mellitus without complications: Secondary | ICD-10-CM

## 2015-01-18 NOTE — Patient Instructions (Signed)
1.  Take Humalog dose 15 min before meal (use fastest absorption area) abdomen   If bG > 200, wait 20 min; if >250 wait 25 min; if >300, wait 30 min    2. 2 hr AFTER meal goal BG rise from pre meal;  50 pt rise    3.  Review carb book & bring to dieitian visit to review   Goals;  Carbs, B=45 GMs; L=45 GMs; dinner=60 Gms; (humalog only with meals)    Snacks 15 GMs 2 x day if you like    4.  Too low, below 70; treat with 3 glucose tabs; recheck 53min; goal >80 then eat

## 2015-01-18 NOTE — Progress Notes (Signed)
Kaylee Harris 39 y.o. female was seen today for diabetes education regarding  uncontrolled type 2 diabetes mellitus. Last seen 06/15/2014 for DSMT.    Diabetes Meds: reviewed in eRecord; patient is taking as prescribed. Lantus pen 40 units @  Hs ; as of 01/05/15 dose increased Humalog kwik pen 10 unit tid meals    Lab Results   Component Value Date    HA1C 7.7 (H) 11/08/2014    HA1C 7.9 (H) 09/15/2014    HA1C 7.8 (H) 05/11/2014    MALBR <0.30 09/15/2014    CREAT 0.82 10/08/2014    LDLC 134 11/08/2014        Self glucose monitoring:  Freestyle Lite BG meter 4 times daily. FBS 145 - 117; AC meals 110-160; infreqt >200  BG today: 135 mg/dl 2 hr PP meal & took H 10 units    Diet: 3 meals per day and Slimfast shake B & L; dinner is biggest meal  Current Activity : moderate activity using walker  Smoking: no Alcohol: yes      Self foot check: yes   Eye Exam inquiry: yes     Assessment:   Needs to wait 11min from Humalog dose to begin meal  Competent return demo  100% med adherence but would like options for alternate insulin delivery (VGO)  No hypo  mychart glucose flowsheet assigned  Coun/Edu:     BARRIERS THAT AFFECT LEARNING:  None  READINESS TO LEARN:  excellent  PREFERRED METHODS OF LEARNING:  reading, demonstration and visual tools  EDUCATION PROVIDED: carb factors, insulin teaching, BG Goals, basal rate testing, using ICR and CF to determine meal bolus and VGO device; risk reduction    Plan:     Patient Instructions   1.  Take Humalog dose 15 min before meal (use fastest absorption area) abdomen   If bG > 200, wait 20 min; if >250 wait 25 min; if >300, wait 30 min    2. 2 hr AFTER meal goal BG rise from pre meal;  50 pt rise    3.  Review carb book & bring to dieitian visit to review   Goals;  Carbs, B=45 GMs; L=45 GMs; dinner=60 Gms; (humalog only with meals)    Snacks 15 GMs 2 x day if you like    4.  Too low, below 70; treat with 3 glucose tabs; recheck 70min; goal >80 then eat      DSMT SUPPORT PLAN:  PCP:  cDE  Follow-up visit:  RD CDE  Time spent counseling patient: 60 minutes    DSMT  individual 1 hr; Sonal Dorwart C Makalia Bare, RN, CDE

## 2015-01-19 ENCOUNTER — Ambulatory Visit: Payer: Self-pay

## 2015-01-19 DIAGNOSIS — F3341 Major depressive disorder, recurrent, in partial remission: Secondary | ICD-10-CM

## 2015-01-19 NOTE — Progress Notes (Signed)
Behavioral Health Progress Note     LENGTH OF SESSION: 30 minutes    Contact Type:  Location: On Site    Face to Face     Problem(s)/Goals Addressed from Treatment Plan:    Problem 1:   R PSY TP PROBLEM 12/30/2014   1ST TREATMENT PLAN PROBLEM Depression and anxiety       Goal for this problem:    R PSY TP GOAL 12/30/2014   1ST TREATMENT PLAN GOAL Learn DBT and PST skills to manage affect       Progress towards this goal: Problem resolving. Comment Patient reports balanced mood.    Mental Status Exam:  APPEARANCE: Appears stated age, Well-groomed, Casual  ATTITUDE TOWARD INTERVIEWER: Cooperative  MOTOR ACTIVITY: WNL (within normal limits)  EYE CONTACT: Direct  SPEECH: Normal rate and tone  AFFECT: Full Range  MOOD: Lively and Neutral  THOUGHT PROCESS: Normal and Circumstantial  THOUGHT CONTENT: No unusual themes and Negative Rumination  PERCEPTION: No evidence of hallucinations  CURRENT SUICIDAL IDEATION: patient denies  CURRENT HOMICIDAL IDEATION: Patient denies  ORIENTATION: Alert and Oriented X 3.  CONCENTRATION: Good  MEMORY:   Recent: intact   Remote: intact  COGNITIVE FUNCTION: Average intelligence  JUDGMENT: Intact  IMPULSE CONTROL: Good  INSIGHT: Fair    Risk Assessment:  ASSESSMENT OF RISK FOR SUICIDAL BEHAVIOR  Changes in risk for suicide from baseline Formulation of Risk and/or previous intake, including newly identified risk, if any: none  Violence risk was assessed and No Change noted from baseline formulation of risk and/or previous assessment.    Session Content:: Patient reports balanced mood. She said that she and her wife are not following through with couples' counseling as they are doing well. Patient said that her wife's doctor changed her medications and this has helped her behavior.Patient said that her medication changed has also seemed to have helped her. She said that she is going out more, socializing more, cleaning and organizing, etc. Writer commended patient on her progress. Writer  talked to patient about attending another group and patient said that she did not want to do this. Writer and patient discussed patient transitioning out of treatment in the next three months and patient's options with regard to seeing someone in private practice.    Interventions:  Dialectical Behavioral Therapy (DBT), Supportive Psychotherapy, Treatment Planning, Termination process discussed, Solution Focused therapy    Current Treatment Plan   Created/Updated On 12/30/2014   Extends to 03/31/2015         Plan:  Psychotherapy continues as described in care plan; plan remains the same.    NEXT APPT: 02/10/15 @2       Tresa Res, LCSW

## 2015-01-23 ENCOUNTER — Other Ambulatory Visit: Payer: Self-pay

## 2015-01-23 MED ORDER — ARIPIPRAZOLE 15 MG PO TABS *I*
ORAL_TABLET | ORAL | 0 refills | Status: DC
Start: 2015-01-23 — End: 2015-03-14

## 2015-02-07 ENCOUNTER — Encounter: Payer: Self-pay | Admitting: Gastroenterology

## 2015-02-08 ENCOUNTER — Ambulatory Visit: Payer: Self-pay | Admitting: Nutrition

## 2015-02-08 ENCOUNTER — Ambulatory Visit
Admission: RE | Admit: 2015-02-08 | Discharge: 2015-02-08 | Disposition: A | Discharge status: Home / Self Care | Payer: Self-pay | Source: Ambulatory Visit | Admitting: Nutrition

## 2015-02-08 NOTE — Patient Instructions (Signed)
Healthier snacks  15-20g carbs and 7g sugar or less (bars, yogurt)  Fiber 3 g or more  Protein- good 3 g or more    Fiber One Chewy Bars, Protein, Peanut Butter    Popcorn- follow portion 3-4 cups    Oatmeal Flavored lower sugar or sugar free instant packet    Bread 100% whole grain- [employer] Soft 100% Whole Wheat Bread

## 2015-02-08 NOTE — Progress Notes (Signed)
Kaylee Harris is here today for [facility] regarding Insulin requiring Type 2 Diabetes. Referred by: PCP  Here with wife  Started mealtime insulin     Wt Readings from Last 3 Encounters:   11/25/14 133.4 kg (294 lb)   11/21/14 133.6 kg (294 lb 9.6 oz)   11/08/14 133.8 kg (295 lb)     Lab Results   Component Value Date    HA1C 7.7 (H) 11/08/2014    HA1C 7.9 (H) 09/15/2014    HA1C 7.8 (H) 05/11/2014    MALBR <0.30 09/15/2014    CREAT 0.82 10/08/2014    LDLC 134 11/08/2014       Diet: grazing on snacks not going to program anymore; 3 meals close together 11 am 1 pm and 5 pm  Beverages: Powerade Zero  ETOH: no  Exercise : sedentary  ; dizzy spells  DM Meds: Lantus 40 units pm humalog 10 each meal   Taking as prescribed.       Meter:  Freestyle lite 4 time daily,   before breakfast and 2 HR after meal    Assessment:   A1c and BGs trending down 30d avg= 172;   higher with chinese= 300; l  owest 84- felt hypo so ate candy bar; reviewed proper hypo tx today    Obesity  - wt the same as 9 months ago; at least did not go up with insulin   Meals- are OK but eating too many snacks; reviewed healthier choices   Exercise very limited      Coun/Edu:     Barriers to learning:  Neuro problems  Education Provided: Plate Method label reading and carb factors    Plan:   Nutrition Goals:   1500 calories  150-180 gm carbs  Weight goal: (-10%) 270 lb      Follow-up visit:  3 month(s)  Time spent counseling patient: 55 minutes    MVPO changing to [place], RD, CDE

## 2015-02-10 ENCOUNTER — Ambulatory Visit: Payer: Self-pay

## 2015-02-10 ENCOUNTER — Encounter: Payer: Self-pay | Admitting: Psychiatry

## 2015-02-10 DIAGNOSIS — F331 Major depressive disorder, recurrent, moderate: Secondary | ICD-10-CM
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
## 2015-02-11 ENCOUNTER — Other Ambulatory Visit: Payer: Self-pay | Admitting: Primary Care

## 2015-02-13 ENCOUNTER — Other Ambulatory Visit: Payer: Self-pay

## 2015-02-13 MED ORDER — MIRTAZAPINE 7.5 MG PO TABS *A*
7.5000 mg | ORAL_TABLET | Freq: Every evening | ORAL | 1 refills | Status: DC
Start: 2015-02-13 — End: 2015-02-27

## 2015-02-16 ENCOUNTER — Other Ambulatory Visit: Payer: Self-pay | Admitting: Psychiatry

## 2015-02-16 MED ORDER — BUSPIRONE HCL 30 MG PO TABS *I*
30.0000 mg | ORAL_TABLET | Freq: Two times a day (BID) | ORAL | 2 refills | Status: DC
Start: 2015-02-16 — End: 2015-05-26

## 2015-02-27 ENCOUNTER — Ambulatory Visit: Payer: Self-pay | Admitting: Psychiatry

## 2015-02-27 ENCOUNTER — Ambulatory Visit: Payer: Self-pay

## 2015-02-27 ENCOUNTER — Encounter: Payer: Self-pay | Admitting: Psychiatry

## 2015-02-27 VITALS — BP 139/73 | HR 72 | Resp 18 | Ht 67.99 in | Wt 302.0 lb

## 2015-02-27 DIAGNOSIS — F429 Obsessive-compulsive disorder, unspecified: Secondary | ICD-10-CM

## 2015-02-27 DIAGNOSIS — F339 Major depressive disorder, recurrent, unspecified: Secondary | ICD-10-CM

## 2015-02-27 DIAGNOSIS — F603 Borderline personality disorder: Secondary | ICD-10-CM

## 2015-02-27 DIAGNOSIS — F331 Major depressive disorder, recurrent, moderate: Secondary | ICD-10-CM

## 2015-02-27 NOTE — Progress Notes (Signed)
Behavioral Health Psychopharmacology Follow-up     Length of Session: 20 minutes.    Diagnosis Addressed    ICD-10-CM ICD-9-CM   1. 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 OCD (obsessive compulsive disorder) F42.9 300.3   3. Borderline personality disorder F60.3 301.83       Recent History and Response to Medications  Patient states:   HPI     Follow-up    Additional comments: patient is here to follow up on her medications       Last edited by Shirlee More, LPN on 16/02/9603  5:40 PM. (History)          Current use of alcohol or drugs: No        Neurovegetative Symptoms Review:  Energy level: poor  Concentration: fair  Sleep Quality: good   Number of hours :  (6-8)  Appetite: good     Wt Readings from Last 3 Encounters:   02/27/15 137 kg (302 lb)   12/28/14 134.7 kg (297 lb)   11/30/14 133.8 kg (295 lb)     Enjoyment/interest: fair        Current Medications  Current Outpatient Prescriptions   Medication Sig    midodrine (PROAMATINE) 10 MG tablet Take 10 mg by mouth 3 times daily    tiZANidine (ZANAFLEX) 2 MG capsule Take 2 mg by mouth 2 times daily       busPIRone (BUSPAR) 30 MG tablet Take 1 tablet (30 mg total) by mouth 2 times daily    naproxen sodium (ANAPROX) 550 MG tablet TAKE 1 TABLET (550 MG TOTAL) BY MOUTH 2 TIMES DAILY (WITH MEALS)    mirtazapine (REMERON) 7.5 MG tablet Take 1 tablet (7.5 mg total) by mouth nightly    ARIPiprazole (ABILIFY) 15 MG tablet 1/2 tablet daily in the morning.    zolpidem (AMBIEN CR) 6.25 MG CR tablet Take 1 tablet (6.25 mg total) by mouth nightly as needed for Sleep   Max daily dose: 6.25 mg Swallow whole. Do not crush, break, or chew.    FREESTYLE LITE test strip Use four times daily as directed for 250.02    atorvastatin (LIPITOR) 40 MG tablet Take 1 tablet (40 mg total) by mouth daily (with dinner)    insulin lispro (HUMALOG KWIKPEN) 100 UNIT/ML injection pen Inject 6 Units into the skin 3 times daily (before meals)    blood glucose monitor  system Brand: cheapest brand available per her insurance.  Use as directed.    lancets Brand Free Style Lite; Use 2 times per day as directed for blood glucose testing.    SUMAtriptan refill (IMITREX STATDOSE) 6 MG/0.5ML injection Inject 6 mg into the skin once as needed for Migraine   May repeat once after 1 hour if needed.    pantoprazole (PROTONIX) 40 MG EC tablet Take 1 tablet (40 mg total) by mouth daily   Swallow whole. Do not crush, break, or chew.    atenolol (TENORMIN) 25 MG tablet Take 25 mg by mouth daily    DULoxetine (CYMBALTA) 60 MG capsule Take 1 capsule (60 mg total) by mouth daily    Alcohol Swabs (ALCOHOL WIPES) PADS Use BID for BG check    dicyclomine (BENTYL) 20 MG tablet TAKE 1 TABLET (20 MG TOTAL) BY MOUTH 4 TIMES DAILY (BEFORE MEALS AND NIGHTLY)    insulin pen needle (NOVOFINE) 30G X 8 MM Use 1 times a day as instructed.    topiramate (TOPAMAX) 100 MG tablet Take 100 mg  by mouth 2 times daily    fluticasone (FLONASE) 50 MCG/ACT nasal spray SPRAY 1 SPRAY BY NASAL ROUTE DAILY    insulin glargine (LANTUS SOLOSTAR) 100 UNIT/ML injection pen Inject 32 Units into the skin nightly (Patient taking differently: Inject 36 Units into the skin nightly   )    cetirizine (ZYRTEC) 10 MG tablet Take 10 mg by mouth daily    levonorgestrel (MIRENA) 20 MCG/24HR IUD 1 each by Intrauterine route once    VENTOLIN HFA 108 (90 BASE) MCG/ACT inhaler Inhale 1-2 puffs into the lungs every 4-6 hours as needed for Wheezing    clonazePAM (KLONOPIN) 0.5 MG tablet Take 1 tablet (0.5 mg total) by mouth 2 times daily as needed   Max daily dose: 1 mg    famciclovir (FAMVIR) 500 MG tablet Take 1 tablet (500 mg total) by mouth 3 times daily as needed    promethazine (PHENERGAN) 12.5 MG tablet Take 1 tablet (12.5 mg total) by mouth every 4-6 hours as needed for Nausea    SUMAtriptan (IMITREX) 50 MG tablet Take 1 tablet (50 mg total) by mouth as needed for Migraine   Take at onset of headache. May repeat once in 2  hours.    Non-System Medication The above patient is followed in our clinic and cannot resume work permanently.    ondansetron (ZOFRAN) 8 MG tablet Take 1 tablet (8 mg total) by mouth 3 times daily as needed for Nausea     No current facility-administered medications for this visit.        Side Effects  Patient Reported Side Effects: None reported    Mental Status  APPEARANCE: Casual  ATTITUDE TOWARD INTERVIEWER: Cooperative  MOTOR ACTIVITY: WNL (within normal limits)  EYE CONTACT: Direct  SPEECH: Normal rate and tone  AFFECT: Decreased Range and Appropriate  MOOD: Anxious and Depressed  THOUGHT PROCESS: Normal  THOUGHT CONTENT: No unusual themes  PERCEPTION: Within normal limits  ORIENTATION: Alert and Oriented X 3.  CONCENTRATION: Fair  MEMORY:   Recent: intact   Remote: intact  COGNITIVE FUNCTION: Average intelligence  JUDGMENT: Intact  IMPULSE CONTROL: Fair  INSIGHT: Fair    Risk Assessment    Self Injury: Patient Denies  Suicidal Ideation: Yes. Describe: "wishing that whnen I did it before that it had worked."  Denies any current planning or intent.  Homicidal Ideation: Patient Denies  Aggressive Behavior: Patient Denies    Results  none    BP Readings from Last 3 Encounters:   02/27/15 139/73   12/28/14 141/70   11/30/14 122/80       Assessment  FORMULATION: Patient says that the mood changed when she started the baclofen and "I wasn't sure if the back drugs were messing with the psych drugs."  She has since stopped the Baclofen but has only been off for a few days.  She says prior to starting the baclofen, she was doing well on the increased Abilify.  She just started on the Tizinadine instead two days ago.  She says she would like to taper off any medications she does not need.  She already has significantly decreased her use of Klonopin and says she last took it three weeks ago.  Her sleep is good but it did not really get better until she started on the Ambien.  We discussed stopping the mirtazepine  which she agreed to try.     Recommendations/Plan and Rationale  PLAN: Discontinue mirtazapine.  Continue other medications.  Return in 7  weeks .

## 2015-02-27 NOTE — Progress Notes (Signed)
Behavioral Health Progress Note     LENGTH OF SESSION: 30 minutes    Contact Type:  Location: On Site    Face to Face     Problem(s)/Goals Addressed from Treatment Plan:    Problem 1:   R PSY TP PROBLEM 12/30/2014   1ST TREATMENT PLAN PROBLEM Depression and anxiety       Goal for this problem:    R PSY TP GOAL 12/30/2014   1ST TREATMENT PLAN GOAL Learn DBT and PST skills to manage affect       Progress towards this goal: Problem resolving. Comment Patient reports mood stabilization.    Mental Status Exam:  APPEARANCE: Appears stated age, Well-groomed, Casual  ATTITUDE TOWARD INTERVIEWER: Cooperative  MOTOR ACTIVITY: WNL (within normal limits)  EYE CONTACT: Direct  SPEECH: Normal rate and tone  AFFECT: Full Range  MOOD: Anxious, Depressed and Neutral  THOUGHT PROCESS: Normal and Circumstantial  THOUGHT CONTENT: No unusual themes and Negative Rumination  PERCEPTION: No evidence of hallucinations  CURRENT SUICIDAL IDEATION: patient denies  CURRENT HOMICIDAL IDEATION: Patient denies  ORIENTATION: Alert and Oriented X 3.  CONCENTRATION: Good  MEMORY:   Recent: intact   Remote: intact  COGNITIVE FUNCTION: Average intelligence  JUDGMENT: Intact  IMPULSE CONTROL: Fair  INSIGHT: Fair    Risk Assessment:  ASSESSMENT OF RISK FOR SUICIDAL BEHAVIOR  Changes in risk for suicide from baseline Formulation of Risk and/or previous intake, including newly identified risk, if any: none  Violence risk was assessed and No Change noted from baseline formulation of risk and/or previous assessment.    Session Content::Patient reports mood stabilization. She said that her neurologist switched her medication and she feels tired. Writer and patient discussed patient's activities and possible contribution to feeling tired. Patient said that she was at the mall and passed out. She said that they "made" her go to the hospital and that she was out in a 1/2 hour. Writer and patient discussed patient deferring decisions to others. Patient talked  about going to her in-laws for her sister-in-law's birthday celebration. Writer talked to patient about working on this issue and engaging in the trauma group. Patient said that she has been eating lots of sweets and writer suggested patient work on limiting this by not bringing them into the house. Writer and patient will work on diary cards at her next session.     Interventions:  Dialectical Behavioral Therapy (DBT), Supportive Psychotherapy, Solution Focused therapy, trauma recovery    Current Treatment Plan   Created/Updated On 12/30/2014   Next Treatment Plan Due 03/31/2015       Plan:  Psychotherapy continues as described in care plan; plan remains the same.    NEXT APPT: 03/13/15 @2       Tresa Res, LCSW

## 2015-03-02 ENCOUNTER — Telehealth: Payer: Self-pay

## 2015-03-02 NOTE — Telephone Encounter (Signed)
Pt states she is taking  Methylprednisolone for a total of 6 days on taper dose. Pt stated she took the first dose yesterday and last night her BG was 180. Pt checks her BG's 4 times a day. This morning fasting 167 at noon 272 after eating cereal and coffee. Pt is taking 10 units of humalog before each meal and 40 units of lantus at night. Pt want to know if this is something to worry about or should she should have her insulin adjusted. Please advise.

## 2015-03-02 NOTE — Telephone Encounter (Signed)
No changes needed if she will be off of it in 6 days. The sugar will come back down to their previous levels once it's out of her system, and usually changing it for that short period of time just leads to confusion and risk. Generally we just put up with the high sugar for the few days.    [name], MD  [facility]  03/02/2015  4:24 PM

## 2015-03-02 NOTE — Telephone Encounter (Signed)
Pt called because she stated that she is a diabetic and her Neurologist prescribed her methylprednisolone, which pt started last night and when she cheked her sugar it around 180 then took her second dose today and took her blood sugar a little while ago and its up to 260. Pt is aware that a side effect of taking this medication is high blood sugars, but pt is not sure if Dr.Moore would her to adjust her insulin accordingly or not worry about. Pt can be reached at   [phone] as this was verified as the best number to reach her.

## 2015-03-03 NOTE — Telephone Encounter (Signed)
The patient called back but all of the nurses were with patients at the time of the call. Please give her a call back at [phone]

## 2015-03-03 NOTE — Telephone Encounter (Signed)
Information given to pt.  Understanding expressed

## 2015-03-03 NOTE — Telephone Encounter (Signed)
Left msg for pt to call back

## 2015-03-11 ENCOUNTER — Other Ambulatory Visit: Payer: Self-pay | Admitting: Psychiatry

## 2015-03-11 ENCOUNTER — Encounter: Payer: Self-pay | Admitting: Psychiatry

## 2015-03-13 ENCOUNTER — Ambulatory Visit: Payer: Self-pay

## 2015-03-13 ENCOUNTER — Encounter: Payer: Self-pay | Admitting: Psychiatry

## 2015-03-13 DIAGNOSIS — F3341 Major depressive disorder, recurrent, in partial remission: Secondary | ICD-10-CM
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
## 2015-03-13 NOTE — Progress Notes (Signed)
Patient sent a message through mychart that since she stopped the mirtazapine, she is not sleeping as well.  She requested to go back on it to Engineer, production agreed.  Writer will add the Mirtazapine 7.5 mg back on her medication list.

## 2015-03-14 ENCOUNTER — Telehealth: Payer: Self-pay

## 2015-03-14 ENCOUNTER — Other Ambulatory Visit: Payer: Self-pay | Admitting: Psychiatry

## 2015-03-14 MED ORDER — ARIPIPRAZOLE 15 MG PO TABS *I*
ORAL_TABLET | ORAL | 1 refills | Status: DC
Start: 2015-03-14 — End: 2015-05-09

## 2015-03-14 MED ORDER — ZOLPIDEM TARTRATE 6.25 MG PO TBCR *A*
6.2500 mg | ORAL_TABLET | Freq: Every evening | ORAL | 2 refills | Status: DC | PRN
Start: 2015-03-14 — End: 2015-06-12

## 2015-03-14 NOTE — Telephone Encounter (Signed)
Patients medications Humalog and Lantuss are not lasting a full month they only come 100 per box and the way the script is written she cannot get anymore when she runs out. Patient is using 4 times a day. Asking that Dr. Laurance Flatten write the Script so that she would be able to get 2 Boxes each a month.

## 2015-03-14 NOTE — Telephone Encounter (Signed)
Clinical Management Note   Writer called patient's PCP regarding transportation options. Writer was informed that patient had MVP and would only qualify for transportation if she had Medicaid.

## 2015-03-15 MED ORDER — INSULIN LISPRO (HUMAN) 100 UNIT/ML SC SOPN *I*
10.0000 [IU] | PEN_INJECTOR | Freq: Three times a day (TID) | SUBCUTANEOUS | 2 refills | Status: DC
Start: 2015-03-15 — End: 2015-05-22

## 2015-03-15 MED ORDER — INSULIN PEN NEEDLE 30G X 8 MM MISC *A*
11 refills | Status: DC
Start: 2015-03-15 — End: 2015-04-18

## 2015-03-15 MED ORDER — INSULIN GLARGINE 100 UNIT/ML SC SOPN *I*
40.0000 [IU] | PEN_INJECTOR | Freq: Every evening | SUBCUTANEOUS | 5 refills | Status: DC
Start: 2015-03-15 — End: 2015-05-11

## 2015-03-15 NOTE — Telephone Encounter (Signed)
Clarified with pt; needs needles for insulin pens. Order written.    [name], MD  [facility]  03/15/2015  1:52 PM

## 2015-03-16 ENCOUNTER — Other Ambulatory Visit: Payer: Self-pay | Admitting: Primary Care

## 2015-03-20 ENCOUNTER — Telehealth: Payer: Self-pay | Admitting: Primary Care

## 2015-03-20 NOTE — Telephone Encounter (Signed)
Pt was seen on 03/18/2015 at [facility] for bronchitis, acute -  Calling for a follow up -  No answer unable to leave a message - will send a [facility]

## 2015-03-21 ENCOUNTER — Telehealth: Payer: Self-pay

## 2015-03-21 NOTE — Telephone Encounter (Signed)
Clinical Management Note   Writer received a message through eRecord from patient requesting a call regarding a summons for jury duty. Writer called patient and advised patient that Probation officer could write a letter to recuse patient. Patient indicated her understanding and will bring the summons to her appointment on 03/28/15.

## 2015-03-21 NOTE — Telephone Encounter (Signed)
Pt called to see if provider is able to provide a letter for pt to not assist jury duty as to pt doesn't want jury duty to trigger them. Pt can be reached at [phone]

## 2015-03-28 ENCOUNTER — Ambulatory Visit: Payer: Self-pay

## 2015-03-28 DIAGNOSIS — F603 Borderline personality disorder: Secondary | ICD-10-CM

## 2015-03-28 DIAGNOSIS — I498 Other specified cardiac arrhythmias: Secondary | ICD-10-CM

## 2015-03-28 DIAGNOSIS — Z8659 Personal history of other mental and behavioral disorders: Secondary | ICD-10-CM

## 2015-03-28 DIAGNOSIS — F429 Obsessive-compulsive disorder, unspecified: Secondary | ICD-10-CM

## 2015-03-28 DIAGNOSIS — G90A Postural orthostatic tachycardia syndrome (POTS): Secondary | ICD-10-CM

## 2015-03-28 DIAGNOSIS — F3341 Major depressive disorder, recurrent, in partial remission: Secondary | ICD-10-CM

## 2015-03-28 DIAGNOSIS — Z658 Other specified problems related to psychosocial circumstances: Secondary | ICD-10-CM

## 2015-03-28 NOTE — BH Treatment Plan (Signed)
[facility] Treatment [facility]     Date of Plan:   R PSY TP DATE FROM 03/28/2015   FROM 03/31/2015      Created/Updated On 03/28/2015   Next Treatment Plan Due 06/30/2015       Diagnostic Impression    ICD-10-CM ICD-9-CM   1. 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 History of posttraumatic stress disorder (PTSD) Z86.59 V11.8   3. OCD (obsessive compulsive disorder) F42.9 300.3   4. Borderline personality disorder F60.3 301.83   5. POTS (postural orthostatic tachycardia syndrome) R00.0 427.89    I95.1    6. Morbid obesity E66.01 278.01   7. Interpersonal problem Z65.8 V62.81       Strengths  Strengths derived from the assessment include: Patient is hard working, has supports.    Problem Areas  *At least one problem must be targeted toward risk reduction if Formulation of Risk or any other previous exam indicated special monitoring or intervention for suicide and/or violence risk indicated.    PROBLEM AREAS (choose and describe relevant):  THOUGHT: Negative  MOOD:Depressed and anxious  BEHAVIOR: Reactive and avoidant  ECONOMIC:SSDI  ________________________________________________________________  R PSY TP PROBLEM 03/28/2015   1ST TREATMENT PLAN PROBLEM Depression and anxiety        R PSY TP GOAL 03/28/2015   1ST TREATMENT PLAN GOAL Learn DBT and PST skills to manage affect       The rationale for addressing this problem is that resolving it will (select all that apply):  Reduce symptoms of disorder, Reduce functional impairment associated with disorder, Decrease likelihood of hospitalization, Facilitate transfer skills learned in therapy to everday life, Is a key motivational factor for the patient's participation in treatment, Reduce risk for suicide* and Reduce risk for violence*      Progress toward goal(s): Problem resolving. Comment: Patient has worked to incorporate skills learned in treatment to psychosocial stressors.    1 a. Measurable Objectives : Patient  will make and keep regular individual therapy appointments.   Date established: 05/31/14   Target date: 06/30/15   Attained or Revised? continue    1 b. Measurable Objectives : Patient will continue to learn and utilize skills to manage affect.   Date established: 05/31/14   Target date: 06/30/15   Attained or Revised? continue    ______________________________________________________________________      Plan  TREATMENT MODALITIES:  Individual psychotherapy for 30-60 min Q 1-3 weeks with Tresa Res, LCSW.  Psychopharmacology visits 20-45 min Q 2-8 weeks with [name], NP.     DISCHARGE CRITERIA for this treatment setting: Patient reports a reduction in symptoms as evidenced by PHQ-9 and GAD-7 scores.    Clinician's name: Tresa Res, LCSW    Psychiatrist's Name: Kerri Perches, MD      Patient/Family Statement  PATIENT/FAMILY STATEMENT:  Obtain patient and family input into the treatment plan, including areas of agreement / disagreement.  Obtain patient's signature - if not possible, briefly describe the reason.     Patient Comments:          I HAVE PARTICIPATED IN THE DEVELOPMENT OF THIS TREATMENT PLAN AND I AGREE WITH ITS CONTENTS:       Patient Signature: _______________________________________________________    Date: ______________________________

## 2015-03-28 NOTE — Progress Notes (Signed)
Behavioral Health Progress Note     LENGTH OF SESSION: 30 minutes    Contact Type:  Location: On Site    Face to Face     Problem(s)/Goals Addressed from Treatment Plan:    Problem 1:   R PSY TP PROBLEM 03/28/2015   1ST TREATMENT PLAN PROBLEM Depression and anxiety       Goal for this problem:    R PSY TP GOAL 03/28/2015   1ST TREATMENT PLAN GOAL Learn DBT and PST skills to manage affect       Progress towards this goal: Problem resolving. Comment: Patient continues to work to resolve trauma triggers.    Mental Status Exam:  APPEARANCE: Appears stated age, Well-groomed, Casual  ATTITUDE TOWARD INTERVIEWER: Cooperative  MOTOR ACTIVITY: WNL (within normal limits)  EYE CONTACT: Direct  SPEECH: Normal rate and tone  AFFECT: Full Range  MOOD: Anxious and Depressed  THOUGHT PROCESS: Circumstantial  THOUGHT CONTENT: Negative Rumination  PERCEPTION: No evidence of hallucinations  CURRENT SUICIDAL IDEATION: patient denies  CURRENT HOMICIDAL IDEATION: Patient denies  ORIENTATION: Alert and Oriented X 3.  CONCENTRATION: Good  MEMORY:   Recent: intact   Remote: intact  COGNITIVE FUNCTION: Average intelligence  JUDGMENT: Intact  IMPULSE CONTROL: Good  INSIGHT: Good    Risk Assessment:  ASSESSMENT OF RISK FOR SUICIDAL BEHAVIOR  Changes in risk for suicide from baseline Formulation of Risk and/or previous intake, including newly identified risk, if any: none  Violence risk was assessed and No Change noted from baseline formulation of risk and/or previous assessment.    Session Content::  Writer completed a letter recusing patient from jury duty. Writer and patient discussed patient's treatment goals and updated patient's treatment plan. 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 She said that she does not know of a way to be able to attend group. Patient talked about the ongoing situation with her autistic niece and her concerns about  the girl being over disciplined. Writer and patient discussed patient's triggers in this regard and how patient's niece's situation was different from patient's as a child and patient showed good insight.    Visit Diagnosis:      ICD-10-CM ICD-9-CM   1. 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 History of posttraumatic stress disorder (PTSD) Z86.59 V11.8   3. OCD (obsessive compulsive disorder) F42.9 300.3   4. Borderline personality disorder F60.3 301.83   5. POTS (postural orthostatic tachycardia syndrome) R00.0 427.89    I95.1    6. Morbid obesity E66.01 278.01   7. Interpersonal problem Z65.8 V62.81       Interventions:  Dialectical Behavioral Therapy (DBT), Treatment Planning, Solution Focused therapy; letter to [facility]   Created/Updated On 03/28/2015   Next Treatment Plan Due 06/30/2015     Plan:  Psychotherapy continues as described in care plan; plan remains the same.    NEXT APPT: 04/17/15 @2 :[address], LCSW

## 2015-04-17 ENCOUNTER — Ambulatory Visit: Payer: Self-pay | Admitting: Psychiatry

## 2015-04-17 ENCOUNTER — Ambulatory Visit: Payer: Self-pay

## 2015-04-17 ENCOUNTER — Encounter: Payer: Self-pay | Admitting: Psychiatry

## 2015-04-17 ENCOUNTER — Telehealth: Payer: Self-pay

## 2015-04-17 VITALS — BP 130/78 | HR 71 | Ht 68.0 in | Wt 307.0 lb

## 2015-04-17 DIAGNOSIS — F331 Major depressive disorder, recurrent, moderate: Secondary | ICD-10-CM

## 2015-04-17 DIAGNOSIS — F603 Borderline personality disorder: Secondary | ICD-10-CM

## 2015-04-17 DIAGNOSIS — F429 Obsessive-compulsive disorder, unspecified: Secondary | ICD-10-CM

## 2015-04-17 NOTE — Progress Notes (Signed)
Behavioral Health Psychopharmacology Follow-up     Length of Session: 20 minutes.    Diagnosis Addressed    ICD-10-CM ICD-9-CM   1. 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 OCD (obsessive compulsive disorder) F42.9 300.3   3. Borderline personality disorder F60.3 301.83       Recent History and Response to Medications  Patient states:   HPI     Follow-up    Additional comments: patient here for medication evaluation       Last edited by Rickard Rhymes, RN on 04/17/2015  2:02 PM. (History)          Current use of alcohol or drugs: No        Neurovegetative Symptoms Review:  Energy level: poor  Concentration: fair  Sleep Quality: good   Number of hours : 9  Appetite: good (craving bad food and not eating good food)     Wt Readings from Last 3 Encounters:   04/17/15 (!) 139.3 kg (307 lb)   02/27/15 137 kg (302 lb)   12/28/14 134.7 kg (297 lb)     Enjoyment/interest: poor        Current Medications  Current Outpatient Prescriptions   Medication Sig    dicyclomine (BENTYL) 20 MG tablet TAKE 1 TABLET (20 MG TOTAL) BY MOUTH 4 TIMES DAILY (BEFORE MEALS AND NIGHTLY)    insulin pen needle (NOVOFINE) 30G X 8 MM Use 4 times a day as instructed.    insulin glargine (LANTUS SOLOSTAR) 100 UNIT/ML injection pen Inject 40 Units into the skin nightly    insulin lispro (HUMALOG KWIKPEN) 100 UNIT/ML injection pen Inject 10 Units into the skin 3 times daily (before meals)    ARIPiprazole (ABILIFY) 15 MG tablet 1/2 tablet daily in the morning.    zolpidem (AMBIEN CR) 6.25 MG CR tablet Take 1 tablet (6.25 mg total) by mouth nightly as needed for Sleep   Max daily dose: 6.25 mg Swallow whole. Do not crush, break, or chew.    mirtazapine (REMERON) 7.5 MG tablet Take 7.5 mg by mouth nightly    midodrine (PROAMATINE) 10 MG tablet Take 10 mg by mouth 3 times daily    tiZANidine (ZANAFLEX) 2 MG capsule Take 2 mg by mouth 2 times daily       busPIRone (BUSPAR) 30 MG tablet Take 1 tablet (30 mg total) by mouth 2 times  daily    naproxen sodium (ANAPROX) 550 MG tablet TAKE 1 TABLET (550 MG TOTAL) BY MOUTH 2 TIMES DAILY (WITH MEALS)    VENTOLIN HFA 108 (90 BASE) MCG/ACT inhaler Inhale 1-2 puffs into the lungs every 4-6 hours as needed for Wheezing    clonazePAM (KLONOPIN) 0.5 MG tablet Take 1 tablet (0.5 mg total) by mouth 2 times daily as needed   Max daily dose: 1 mg    FREESTYLE LITE test strip Use four times daily as directed for 250.02    atorvastatin (LIPITOR) 40 MG tablet Take 1 tablet (40 mg total) by mouth daily (with dinner)    blood glucose monitor system Brand: cheapest brand available per her insurance.  Use as directed.    lancets Brand Free Style Lite; Use 2 times per day as directed for blood glucose testing.    SUMAtriptan refill (IMITREX STATDOSE) 6 MG/0.5ML injection Inject 6 mg into the skin once as needed for Migraine   May repeat once after 1 hour if needed.    pantoprazole (PROTONIX) 40 MG EC tablet Take 1 tablet (40  mg total) by mouth daily   Swallow whole. Do not crush, break, or chew.    atenolol (TENORMIN) 25 MG tablet Take 25 mg by mouth daily    DULoxetine (CYMBALTA) 60 MG capsule Take 1 capsule (60 mg total) by mouth daily    Alcohol Swabs (ALCOHOL WIPES) PADS Use BID for BG check    famciclovir (FAMVIR) 500 MG tablet Take 1 tablet (500 mg total) by mouth 3 times daily as needed    promethazine (PHENERGAN) 12.5 MG tablet Take 1 tablet (12.5 mg total) by mouth every 4-6 hours as needed for Nausea    topiramate (TOPAMAX) 100 MG tablet Take 100 mg by mouth 2 times daily    fluticasone (FLONASE) 50 MCG/ACT nasal spray SPRAY 1 SPRAY BY NASAL ROUTE DAILY    cetirizine (ZYRTEC) 10 MG tablet Take 10 mg by mouth daily    levonorgestrel (MIRENA) 20 MCG/24HR IUD 1 each by Intrauterine route once    SUMAtriptan (IMITREX) 50 MG tablet Take 1 tablet (50 mg total) by mouth as needed for Migraine   Take at onset of headache. May repeat once in 2 hours.    Non-System Medication The above patient is  followed in our clinic and cannot resume work permanently.    ondansetron (ZOFRAN) 8 MG tablet Take 1 tablet (8 mg total) by mouth 3 times daily as needed for Nausea     No current facility-administered medications for this visit.        Side Effects  Patient Reported Side Effects: None reported    Mental Status  APPEARANCE: Casual  ATTITUDE TOWARD INTERVIEWER: Cooperative  MOTOR ACTIVITY: WNL (within normal limits)  EYE CONTACT: Direct  SPEECH: Normal rate and tone  AFFECT: Full Range and Appropriate  MOOD: Euthymic  THOUGHT PROCESS: Normal  THOUGHT CONTENT: No unusual themes  PERCEPTION: Within normal limits  ORIENTATION: Alert and Oriented X 3.  CONCENTRATION: Good  MEMORY:   Recent: intact   Remote: intact  COGNITIVE FUNCTION: Average intelligence  JUDGMENT: Intact  IMPULSE CONTROL: Fair but poor with eating  INSIGHT: Fair    Risk Assessment    Self Injury: Patient Denies  Suicidal Ideation: Yes. Describe: But says they go away quickly  Homicidal Ideation: Patient Denies  Aggressive Behavior: Patient Denies    Results  none    BP Readings from Last 3 Encounters:   04/17/15 130/78   02/27/15 139/73   12/28/14 141/70       Assessment  FORMULATION: Patient says that she had the best birthday ever because her father-in-law did not come.  Her mood was very bright when discussing this.  But she is already worrying about seeing him at Christmas.  We discussed using some mindfulness skills to help her not to be thinking so much about it now.  She is doing well on her current medications so we discussed not making any changes right now to which she agreed.      Recommendations/Plan and Rationale  PLAN: Continue current medications. Return in 8 weeks.

## 2015-04-17 NOTE — Telephone Encounter (Signed)
Writer left vm for patient to call and reschedule due to being bumped

## 2015-04-18 ENCOUNTER — Other Ambulatory Visit: Payer: Self-pay | Admitting: Primary Care

## 2015-04-18 ENCOUNTER — Other Ambulatory Visit: Payer: Self-pay | Admitting: Family Medicine

## 2015-04-18 ENCOUNTER — Other Ambulatory Visit: Payer: Self-pay | Admitting: Adult Health

## 2015-04-18 MED ORDER — INSULIN PEN NEEDLE 30G X 8 MM MISC *A*
11 refills | Status: DC
Start: 2015-04-18 — End: 2015-08-07

## 2015-04-18 MED ORDER — MIRTAZAPINE 7.5 MG PO TABS *A*
7.5000 mg | ORAL_TABLET | Freq: Every evening | ORAL | 2 refills | Status: DC
Start: 2015-04-18 — End: 2015-07-31

## 2015-04-18 MED ORDER — FLUTICASONE PROPIONATE 50 MCG/ACT NA SUSP *I*
1.0000 | Freq: Every day | NASAL | 3 refills | Status: DC
Start: 2015-04-18 — End: 2016-05-15

## 2015-04-24 ENCOUNTER — Other Ambulatory Visit: Payer: Self-pay | Admitting: Neurology

## 2015-04-24 ENCOUNTER — Other Ambulatory Visit: Payer: Self-pay | Admitting: Psychiatry

## 2015-04-25 ENCOUNTER — Other Ambulatory Visit: Payer: Self-pay | Admitting: Psychiatry

## 2015-04-25 MED ORDER — DULOXETINE HCL 60 MG PO CPEP *I*
60.0000 mg | DELAYED_RELEASE_CAPSULE | Freq: Every day | ORAL | 2 refills | Status: DC
Start: 2015-04-25 — End: 2016-06-18

## 2015-04-27 ENCOUNTER — Telehealth: Payer: Self-pay

## 2015-04-27 NOTE — Telephone Encounter (Signed)
Called pt to schedule appt. No answer or VM phone just kept ringing

## 2015-04-27 NOTE — Telephone Encounter (Signed)
-----   Message from Arbutus Leas, NP sent at 04/26/2015  2:21 PM EST -----  Pt requesting renewal of med; although, it doesn't look like we were the last one to prescribe it (imitrex).  Please contact her to make an apt since she hasn't been seen in quite a few years.  Would make apt with an attending since she was last seen by Hubert Azure, NP.

## 2015-05-02 ENCOUNTER — Other Ambulatory Visit: Payer: Self-pay

## 2015-05-02 ENCOUNTER — Other Ambulatory Visit: Payer: Self-pay | Admitting: Gastroenterology

## 2015-05-02 LAB — COMPREHENSIVE METABOLIC PANEL
ALT: 35 U/L (ref 10–49)
AST: 24 U/L (ref 7–37)
Albumin: 4.1 g/dL (ref 3.2–4.8)
Alk Phos: 113 U/L (ref 46–116)
Anion Gap: 12 mEq/L (ref 4–16)
Bilirubin,Total: 0.2 mg/dL — ABNORMAL LOW (ref 0.3–1.2)
CO2: 23 mEq/L (ref 20–31)
Calcium: 9.3 mg/dL (ref 8.5–10.4)
Chloride: 106 mEq/L (ref 98–108)
Creatinine: 0.7 mg/dL (ref 0.5–0.9)
Globulin: 2.4 g/dL (ref 2.4–4.3)
Glucose: 221 mg/dL — ABNORMAL HIGH (ref 65–100)
Lab: 10 mg/dL (ref 8–20)
Potassium: 4.3 mEq/L (ref 3.5–5.1)
Sodium: 141 mEq/L (ref 135–145)
Total Protein: 6.5 g/dL (ref 6.4–8.5)

## 2015-05-02 LAB — CBC AND DIFFERENTIAL
Baso # K/uL: 0 10*3/uL (ref 0.0–0.2)
Basophil %: 0 % (ref 0–3)
Eos # K/uL: 0.1 10*3/uL (ref 0.0–0.6)
Eosinophil %: 1 % (ref 0–5)
Hematocrit: 37 % (ref 35–47)
Hemoglobin: 11.6 g/dL — ABNORMAL LOW (ref 12.0–16.0)
Lymph # K/uL: 2.6 10*3/uL (ref 1.0–4.8)
Lymphocyte %: 21 % (ref 15–45)
MCH: 26.7 pg (ref 26.0–34.0)
MCHC: 31.2 g/dL (ref 31.0–37.5)
MCV: 86 fL (ref 80–100)
Mono # K/uL: 0.6 10*3/uL (ref 0.1–1.0)
Monocyte %: 5 % (ref 0–15)
Neut # K/uL: 8.9 10*3/uL — ABNORMAL HIGH (ref 1.8–8.0)
Platelets: 311 10*3/uL (ref 150–450)
RBC: 4.35 10*6/uL (ref 3.80–5.20)
RDW: 14.2 % (ref 0.0–15.2)
Seg Neut %: 73 % (ref 45–75)
WBC: 12.3 10*3/uL — ABNORMAL HIGH (ref 4.0–11.0)

## 2015-05-02 LAB — MAGNESIUM: Magnesium: 1.9 mg/dL (ref 1.5–2.4)

## 2015-05-02 LAB — ESTIMATED GFR
GFR,Black: >60 mL/min
GFR,Caucasian: >60 mL/min

## 2015-05-02 LAB — TROPONIN T: Troponin T: <0.01 ng/mL (ref 0.00–0.09)

## 2015-05-03 DIAGNOSIS — F319 Bipolar disorder, unspecified: Secondary | ICD-10-CM | POA: Insufficient documentation

## 2015-05-03 DIAGNOSIS — S0634AA Traumatic hemorrhage of right cerebrum with loss of consciousness status unknown, initial encounter: Secondary | ICD-10-CM | POA: Insufficient documentation

## 2015-05-03 DIAGNOSIS — S06349A Traumatic hemorrhage of right cerebrum with loss of consciousness of unspecified duration, initial encounter: Secondary | ICD-10-CM | POA: Insufficient documentation

## 2015-05-03 LAB — CBC AND DIFFERENTIAL
Baso # K/uL: 0.1 10*3/uL (ref 0.0–0.2)
Basophil %: 0 % (ref 0–3)
Eos # K/uL: 0.2 10*3/uL (ref 0.0–0.6)
Eosinophil %: 2 % (ref 0–5)
Hematocrit: 37 % (ref 35–47)
Hemoglobin: 11.2 g/dL — ABNORMAL LOW (ref 12.0–16.0)
Lymph # K/uL: 4.2 10*3/uL (ref 1.0–4.8)
Lymphocyte %: 31 % (ref 15–45)
MCH: 26.3 pg (ref 26.0–34.0)
MCHC: 30.6 g/dL — ABNORMAL LOW (ref 31.0–37.5)
MCV: 86 fL (ref 80–100)
Mono # K/uL: 0.7 10*3/uL (ref 0.1–1.0)
Monocyte %: 5 % (ref 0–15)
Neut # K/uL: 8.4 10*3/uL — ABNORMAL HIGH (ref 1.8–8.0)
Platelets: 339 10*3/uL (ref 150–450)
RBC: 4.26 10*6/uL (ref 3.80–5.20)
RDW: 14.2 % (ref 0.0–15.2)
Seg Neut %: 62 % (ref 45–75)
WBC: 13.6 10*3/uL — ABNORMAL HIGH (ref 4.0–11.0)

## 2015-05-03 LAB — POCT GLUCOSE
Glucose POCT: 155 mg/dL — ABNORMAL HIGH (ref 65–100)
Glucose POCT: 159 mg/dL — ABNORMAL HIGH (ref 65–100)

## 2015-05-04 ENCOUNTER — Telehealth: Payer: Self-pay | Admitting: Primary Care

## 2015-05-04 ENCOUNTER — Telehealth: Payer: Self-pay

## 2015-05-04 ENCOUNTER — Ambulatory Visit: Payer: Self-pay

## 2015-05-04 NOTE — Telephone Encounter (Signed)
PT in [facility] for fall - sending a [facility] per CM - to see if PT would like to come in for a follow up appt

## 2015-05-04 NOTE — Telephone Encounter (Signed)
Covering for Dr. Laurance Flatten today.   Pt recently seen at [facility] ED on 12.27.16 after fall.   Head CT noted a questionable small contusion within right frontal lobe, recommended short term follow up imaging to further evaluate.   CT of cervical spine was normal.     Please call patient and have her schedule follow up with either Dr. Satira Mccallum or Dr. Fara Chute to follow up on recent ED visit.     I have left the ED report in my "for review" bin if provider would like to see report.     Thanks,   Nelta Numbers, MD

## 2015-05-04 NOTE — Telephone Encounter (Signed)
Called patient to schedule appointment, however she refused to scheduled, stated she is ok

## 2015-05-04 NOTE — Telephone Encounter (Signed)
Patient is scheduled per her request in one week.

## 2015-05-06 ENCOUNTER — Other Ambulatory Visit: Payer: Self-pay

## 2015-05-06 ENCOUNTER — Encounter: Payer: Self-pay | Admitting: Primary Care

## 2015-05-06 ENCOUNTER — Encounter: Payer: Self-pay | Admitting: Gastroenterology

## 2015-05-07 ENCOUNTER — Other Ambulatory Visit: Payer: Self-pay | Admitting: Psychiatry

## 2015-05-07 LAB — CBC AND DIFFERENTIAL
Baso # K/uL: 0 10*3/uL (ref 0.0–0.2)
Basophil %: 0 % (ref 0–3)
Eos # K/uL: 0.1 10*3/uL (ref 0.0–0.6)
Eosinophil %: 1 % (ref 0–5)
Hematocrit: 38 % (ref 35–47)
Hemoglobin: 11.6 g/dL — ABNORMAL LOW (ref 12.0–16.0)
Lymph # K/uL: 2.2 10*3/uL (ref 1.0–4.8)
Lymphocyte %: 15 % (ref 15–45)
MCH: 26.7 pg (ref 26.0–34.0)
MCHC: 30.9 g/dL — ABNORMAL LOW (ref 31.0–37.5)
MCV: 86 fL (ref 80–100)
Mono # K/uL: 0.6 10*3/uL (ref 0.1–1.0)
Monocyte %: 4 % (ref 0–15)
Neut # K/uL: 11.3 10*3/uL — ABNORMAL HIGH (ref 1.8–8.0)
Platelets: 330 10*3/uL (ref 150–450)
RBC: 4.35 10*6/uL (ref 3.80–5.20)
RDW: 13.9 % (ref 0.0–15.2)
Seg Neut %: 79 % — ABNORMAL HIGH (ref 45–75)
WBC: 14.2 10*3/uL — ABNORMAL HIGH (ref 4.0–11.0)

## 2015-05-07 LAB — BASIC METABOLIC PANEL
Anion Gap: 7 mEq/L (ref 4–16)
CO2: 23 mEq/L (ref 20–31)
Calcium: 9.3 mg/dL (ref 8.5–10.4)
Chloride: 108 mEq/L (ref 98–108)
Creatinine: 0.8 mg/dL (ref 0.5–0.9)
Glucose: 268 mg/dL — ABNORMAL HIGH (ref 65–100)
Lab: 9 mg/dL (ref 8–20)
Potassium: 4.1 mEq/L (ref 3.5–5.1)
Sodium: 138 mEq/L (ref 135–145)

## 2015-05-07 LAB — TROPONIN T: Troponin T: <0.01 ng/mL (ref 0.00–0.09)

## 2015-05-07 LAB — ESTIMATED GFR
GFR,Black: >60 mL/min
GFR,Caucasian: >60 mL/min

## 2015-05-07 LAB — BHCG, QUANT PREGNANCY: BHCG, QUANT PREGNANCY: <2 m[IU]/mL

## 2015-05-09 ENCOUNTER — Other Ambulatory Visit: Payer: Self-pay | Admitting: Psychiatry

## 2015-05-09 MED ORDER — ARIPIPRAZOLE 15 MG PO TABS *I*
ORAL_TABLET | ORAL | 0 refills | Status: DC
Start: 2015-05-09 — End: 2015-06-08

## 2015-05-11 ENCOUNTER — Encounter: Payer: Self-pay | Admitting: Family Medicine

## 2015-05-11 ENCOUNTER — Telehealth: Payer: Self-pay

## 2015-05-11 ENCOUNTER — Ambulatory Visit: Payer: Self-pay | Admitting: Family Medicine

## 2015-05-11 VITALS — BP 112/78 | HR 84 | Temp 98.8°F | Ht 68.11 in | Wt 297.0 lb

## 2015-05-11 DIAGNOSIS — R55 Syncope and collapse: Secondary | ICD-10-CM

## 2015-05-11 DIAGNOSIS — Z23 Encounter for immunization: Secondary | ICD-10-CM

## 2015-05-11 DIAGNOSIS — S0990XA Unspecified injury of head, initial encounter: Secondary | ICD-10-CM

## 2015-05-11 MED ORDER — INSULIN GLARGINE 100 UNIT/ML SC SOPN *I*
44.0000 [IU] | PEN_INJECTOR | Freq: Every evening | SUBCUTANEOUS | 5 refills | Status: DC
Start: 2015-05-11 — End: 2015-06-13

## 2015-05-11 MED ORDER — NON-SYSTEM MEDICATION *A*
1 refills | Status: DC
Start: 2015-05-11 — End: 2015-11-08

## 2015-05-11 MED ORDER — PROMETHAZINE HCL 12.5 MG PO TABS *I*
12.5000 mg | ORAL_TABLET | ORAL | 0 refills | Status: DC | PRN
Start: 2015-05-11 — End: 2015-06-05

## 2015-05-11 NOTE — Telephone Encounter (Signed)
Patient in office today is asking that we mail the Rx for helmet to her home address. She wants to make phone calls to see where she can get it at the least cost. Please advise.
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
## 2015-05-11 NOTE — Patient Instructions (Signed)
Diabetes; increase lantus insulin to 44 units nitely Continue to check fasting glucoses. Do not change humalog insulin at this time as you are not eating on a regular basis.     Headache/nausea; continue your current regime; promethazine was refilled.   Post concussion syndrome can last for several weeks with repetitive injuries.     Call Cardiology and see if appointment can be moved up with increased number of falls.

## 2015-05-12 NOTE — Telephone Encounter (Signed)
Script placed in mail and sent to home address on file

## 2015-05-12 NOTE — Telephone Encounter (Signed)
Script signed and in my outbox

## 2015-05-14 ENCOUNTER — Other Ambulatory Visit: Payer: Self-pay | Admitting: Primary Care

## 2015-05-22 ENCOUNTER — Ambulatory Visit: Payer: Self-pay | Admitting: Family Medicine

## 2015-05-22 VITALS — BP 110/76 | HR 68 | Temp 98.1°F | Ht 68.11 in | Wt 295.8 lb

## 2015-05-22 DIAGNOSIS — L089 Local infection of the skin and subcutaneous tissue, unspecified: Secondary | ICD-10-CM

## 2015-05-22 DIAGNOSIS — L0390 Cellulitis, unspecified: Secondary | ICD-10-CM

## 2015-05-22 DIAGNOSIS — IMO0002 Reserved for concepts with insufficient information to code with codable children: Secondary | ICD-10-CM

## 2015-05-22 DIAGNOSIS — R739 Hyperglycemia, unspecified: Secondary | ICD-10-CM

## 2015-05-22 MED ORDER — CEPHALEXIN 500 MG PO CAPS *I*
500.0000 mg | ORAL_CAPSULE | Freq: Four times a day (QID) | ORAL | 0 refills | Status: DC
Start: 2015-05-22 — End: 2015-06-05

## 2015-05-22 MED ORDER — INSULIN LISPRO (HUMAN) 100 UNIT/ML SC SOPN *I*
12.0000 [IU] | PEN_INJECTOR | Freq: Three times a day (TID) | SUBCUTANEOUS | 2 refills | Status: DC
Start: 2015-05-22 — End: 2015-06-13

## 2015-05-22 NOTE — Progress Notes (Signed)
Patient ID: Kaylee Harris is a 40 y.o. year old female here for a follow up visit:     Chief Complaint   Patient presents with    Toe Pain     left great toe drainage       HPI:     Patient is here today for toe swelling and hyperglycemia in setting of uncontrolled diabetes.    1. Toe swelling: This morning noted increased redness swelling and pus drainage from great left medial toe.  Tender, red and swollen on toe.  Has history of similar issue on right foot previously treated years ago in [facility].  Has had ingrown toenails on bilateral partial toenail removal and ablation.  No fevers or chills.      2. Diabetes: The patient is on Lantus to 44 units at night.  Fasting glucose this morning was 190 fasting, and they have been high to 290's 2 hours after eating.  Eating steak sandwiches, ham and cheese omlets, trying to minimize carbohydrates.      ALLERGY:     Allergies   Allergen Reactions    Morphine Anaphylaxis    Toradol [Ketorolac Tromethamine] Anaphylaxis     Notes feeling tightness in her throat and full body itching after last dose a month ago. Also notes erythema tracking along her vein after injection. This is not reflected by documentation.  Tolerates Naproxen.    Trazodone Anaphylaxis    Morphine Other (See Comments)     unknown    Toradol [Ketorolac Tromethamine] Other (See Comments)     unknown    Trazodone Other (See Comments)     unknown     CURRENT MEDICATIONS :     Current Outpatient Prescriptions   Medication Sig Note    atorvastatin (LIPITOR) 40 MG tablet TAKE 1 TABLET (40 MG TOTAL) BY MOUTH DAILY (WITH DINNER)     promethazine (PHENERGAN) 12.5 MG tablet Take 1 tablet (12.5 mg total) by mouth every 4-6 hours as needed for Nausea     insulin glargine (LANTUS SOLOSTAR) 100 UNIT/ML injection pen Inject 44 Units into the skin nightly     Non-System Medication Helmet to prevent head injuries;   multiple syncopal episodes and hitting head repeatedly.     ARIPiprazole (ABILIFY) 15 MG tablet  1/2 tablet daily in the morning.     DULoxetine (CYMBALTA) 60 MG capsule Take 1 capsule (60 mg total) by mouth daily     pantoprazole (PROTONIX) 40 MG EC tablet TAKE 1 TABLET (40 MG TOTAL) BY MOUTH DAILY SWALLOW WHOLE. DO NOT CRUSH, BREAK, OR CHEW.     fluticasone (FLONASE) 50 MCG/ACT nasal spray 1 spray by Nasal route daily     mirtazapine (REMERON) 7.5 MG tablet Take 1 tablet (7.5 mg total) by mouth nightly     insulin pen needle (NOVOFINE) 30G X 8 MM Use 4 times a day as instructed.     dicyclomine (BENTYL) 20 MG tablet TAKE 1 TABLET (20 MG TOTAL) BY MOUTH 4 TIMES DAILY (BEFORE MEALS AND NIGHTLY)     insulin lispro (HUMALOG KWIKPEN) 100 UNIT/ML injection pen Inject 10 Units into the skin 3 times daily (before meals)     zolpidem (AMBIEN CR) 6.25 MG CR tablet Take 1 tablet (6.25 mg total) by mouth nightly as needed for Sleep   Max daily dose: 6.25 mg Swallow whole. Do not crush, break, or chew.     midodrine (PROAMATINE) 10 MG tablet Take 10 mg by mouth 3 times daily  02/27/2015: Received from: External Pharmacy    tiZANidine (ZANAFLEX) 2 MG capsule Take 2 mg by mouth 2 times daily    02/27/2015: Received from: External Pharmacy    busPIRone (BUSPAR) 30 MG tablet Take 1 tablet (30 mg total) by mouth 2 times daily     naproxen sodium (ANAPROX) 550 MG tablet TAKE 1 TABLET (550 MG TOTAL) BY MOUTH 2 TIMES DAILY (WITH MEALS)     VENTOLIN HFA 108 (90 BASE) MCG/ACT inhaler Inhale 1-2 puffs into the lungs every 4-6 hours as needed for Wheezing     clonazePAM (KLONOPIN) 0.5 MG tablet Take 1 tablet (0.5 mg total) by mouth 2 times daily as needed   Max daily dose: 1 mg     FREESTYLE LITE test strip Use four times daily as directed for 250.02     blood glucose monitor system Brand: cheapest brand available per her insurance.  Use as directed.     lancets Brand Free Style Lite; Use 2 times per day as directed for blood glucose testing.     SUMAtriptan refill (IMITREX STATDOSE) 6 MG/0.5ML injection Inject 6 mg  into the skin once as needed for Migraine   May repeat once after 1 hour if needed.     atenolol (TENORMIN) 25 MG tablet Take 25 mg by mouth daily     Alcohol Swabs (ALCOHOL WIPES) PADS Use BID for BG check     famciclovir (FAMVIR) 500 MG tablet Take 1 tablet (500 mg total) by mouth 3 times daily as needed     topiramate (TOPAMAX) 100 MG tablet Take 100 mg by mouth 2 times daily     cetirizine (ZYRTEC) 10 MG tablet Take 10 mg by mouth daily     levonorgestrel (MIRENA) 20 MCG/24HR IUD 1 each by Intrauterine route once     SUMAtriptan (IMITREX) 50 MG tablet Take 1 tablet (50 mg total) by mouth as needed for Migraine   Take at onset of headache. May repeat once in 2 hours.     Non-System Medication The above patient is followed in our clinic and cannot resume work permanently.     ondansetron (ZOFRAN) 8 MG tablet Take 1 tablet (8 mg total) by mouth 3 times daily as needed for Nausea      No current facility-administered medications for this visit.      The medication list was reviewed and changes made where necessary  MEDICAL PROBLEMS:      Patient Active Problem List   Diagnosis Code    DM (diabetes mellitus), type 2, uncontrolled E11.65    Migraine with aura G43.109    Asthma J45.909    Hyperlipemia E78.5    Fibromyalgia M79.7    Syncope and collapse- recurrent, working diagnosis vascular vs psychogenic. Non-arrythmic per Dr Lovena Le 10/2014 R55    Depression, major, recurrent, moderate F33.1    IUD (intrauterine device) in place Z97.5    Skin lesion of face L98.9    IBS (irritable bowel syndrome) K58.9    Anxiety F41.9    OCD (obsessive compulsive disorder) F42.9    Carpal tunnel syndrome G56.00    Meralgia paresthetica of left side G57.12    No diabetic retinopathy in both eyes Z03.89    At risk for abuse of opiates Z91.89    History of repeated overdose Z91.5    Borderline personality disorder F60.3    Neck pain M54.2    Syncope R55    Fatty liver disease, nonalcoholic [id number]  SOCIAL HX:     Social History   Substance Use Topics    Smoking status: Former Smoker     Packs/day: 0.50     Years: 3.00     Types: Cigarettes    Smokeless tobacco: Never Used    Alcohol use 0.0 oz/week     0 Standard drinks or equivalent per week      Comment: <1 weekly       PHYSICAL EXAM:     Vitals:    05/22/15 1511   BP: 110/76   BP Location: Left arm   Patient Position: Sitting   Cuff Size: large adult   Pulse: 68   Temp: 36.7 C (98.1 F)   TempSrc: Temporal   Weight: 134.2 kg (295 lb 12.8 oz)   Height: 1.73 m (5' 8.11")    Wt Readings from Last 3 Encounters:   05/22/15 134.2 kg (295 lb 12.8 oz)   05/11/15 134.7 kg (297 lb)   11/25/14 133.4 kg (294 lb)      Body mass index is 44.83 kg/(m^2).      GENERAL: Sitting up, non-toxic appearing in no acute distress  HEENT: Normal cephalic, no oropharyngeal lesions noted. Moist mucous membranes.   PULM: Breathing comfortably on room air, no evidence of distress.   EXT: Warm, well perfused. Left great toe is erythematous around nailbed and medial nail fold and tender and warm extending to mid foot.  Some pain with flexion and extension.  Puss crusted along medal nail bed.      NEURO: Overall normal exam. Cranial nerves II-XII intact.   PSYCH: Well groomed, makes appropriate eye contact. Good insight and judgment.       RECENT LABS:     Metabolic Profile CBC   Lab Results   Component Value Date    NA 138 05/06/2015    K 4.1 05/06/2015    CL 108 05/06/2015    CO2 23 05/06/2015    UN 9 05/06/2015    CREAT 0.8 05/06/2015    CREAT 0.78 10/01/2014    GLU 268 (H) 05/06/2015    Lab Results   Component Value Date    WBC 14.2 (H) 05/06/2015    HGB 11.6 (L) 05/06/2015    HCT 38 05/06/2015    PLT 330 05/06/2015      LIPIDS DIABETES   Lab Results   Component Value Date    CHOL 234 (A) 11/08/2014    TRIG 330 (A) 11/08/2014    HDL 34 11/08/2014    LDLC 134 11/08/2014    LDLC see below 05/11/2014    LDLC 62 04/05/2013    [place] 6.9 11/08/2014    Lab Results   Component  Value Date    GLU 268 (H) 05/06/2015    MACR see below 09/15/2014     HEMOGLOBIN A1C   Date Value Ref Range Status   11/08/2014 7.7 (H) 4.0 - 6.0 % Final     Comment:                                   ELEVATED   09/15/2014 7.9 (H) 4.0 - 6.0 % Final     Comment:                                   ELEVATED   05/11/2014 7.8 (H) 4.0 -  6.0 % Final     Comment:                                   ELEVATED   01/26/2014 6.7 (H) 4.0 - 6.0 % Final     Comment:                                   NEAR NORMAL GLYCEMIA   10/28/2013 7.0 (H) 4.0 - 6.0 % Final     Comment:                                   ELEVATED        LIVER MISC   Lab Results   Component Value Date    AST 24 05/02/2015    ALT 35 05/02/2015    CK 59 05/02/2014    Lab Results   Component Value Date    TSH 2.17 09/09/2013    VID25 33 09/09/2013          ASSESSMENT AND PLAN:     1. Cellulitis, unspecified cellulitis site    2. DM (diabetes mellitus), type 2, uncontrolled    3. Toe infection    4. Hyperglycemia    Patient has uncontrolled DM with hyperglycemia in 190's fasting and 290's post-prandial with redness and swelling of the left great toe and proximal on the foot concerning for cellulitis.  Will treat with Keflex 500 mg QID and foot soaks and elevation.  Will increase Humalog to 12 units TID and re-evaluate blood sugars with readings in 2 weeks.  Consider altering medical regimen given high doses of insulin (spinting Lantus or new medications).  Reasons for return discussed.  Patient and spouse agreeable to plan of care.      Return in about 2 weeks (around 06/05/2015).   No orders of the defined types were placed in this encounter.     There are no Patient Instructions on file for this visit.

## 2015-05-22 NOTE — Patient Instructions (Addendum)
Bring blood sugar readings in for the next visit.      Increase Humalog to 12 units three times daily with meals.      Take antibiotics four times daily for 10 days for infection of your foot.  Soak your foot in salt soaks three times daily as possible.      Elevate your foot as much possible to the level of your chest.     Return for worsening redness, pain, signs of infection.

## 2015-05-25 ENCOUNTER — Other Ambulatory Visit: Payer: Self-pay

## 2015-05-25 ENCOUNTER — Other Ambulatory Visit: Payer: Self-pay | Admitting: Gastroenterology

## 2015-05-25 LAB — CBC AND DIFFERENTIAL
Baso # K/uL: 0.1 10*3/uL (ref 0.0–0.2)
Basophil %: 1 % (ref 0–3)
Eos # K/uL: 0.2 10*3/uL (ref 0.0–0.6)
Eosinophil %: 1 % (ref 0–5)
Hematocrit: 39 % (ref 35–47)
Hemoglobin: 12 g/dL (ref 12.0–16.0)
Lymph # K/uL: 2.9 10*3/uL (ref 1.0–4.8)
Lymphocyte %: 22 % (ref 15–45)
MCH: 26.4 pg (ref 26.0–34.0)
MCHC: 31.1 g/dL (ref 31.0–37.5)
MCV: 85 fL (ref 80–100)
Mono # K/uL: 0.6 10*3/uL (ref 0.1–1.0)
Monocyte %: 5 % (ref 0–15)
Neut # K/uL: 9.3 10*3/uL — ABNORMAL HIGH (ref 1.8–8.0)
Platelets: 364 10*3/uL (ref 150–450)
RBC: 4.55 10*6/uL (ref 3.80–5.20)
RDW: 14 % (ref 0.0–15.2)
Seg Neut %: 71 % (ref 45–75)
WBC: 13 10*3/uL — ABNORMAL HIGH (ref 4.0–11.0)

## 2015-05-25 LAB — COMPREHENSIVE METABOLIC PANEL
A/G RATIO: 1.5 (ref 1.1–1.8)
ALT: 41 U/L (ref 10–49)
AST: 35 U/L (ref 7–37)
Albumin: 4.3 g/dL (ref 3.2–4.8)
Alk Phos: 115 U/L (ref 46–116)
Anion Gap: 9 mEq/L (ref 4–16)
Bilirubin,Total: 0.3 mg/dL (ref 0.3–1.2)
CO2: 23 mEq/L (ref 20–31)
Calcium: 9.7 mg/dL (ref 8.5–10.4)
Chloride: 108 mEq/L (ref 98–108)
Creatinine: 0.7 mg/dL (ref 0.5–0.9)
Globulin: 2.8 g/dL (ref 2.4–4.3)
Glucose: 153 mg/dL — ABNORMAL HIGH (ref 65–100)
Lab: 8 mg/dL (ref 8–20)
Potassium: 4 mEq/L (ref 3.5–5.1)
Sodium: 140 mEq/L (ref 135–145)
Total Protein: 7.1 g/dL (ref 6.4–8.5)
UN/Creat Ratio: 11.4 — ABNORMAL LOW (ref 12.0–20.0)

## 2015-05-25 LAB — TROPONIN T: Troponin T: <0.01 ng/mL (ref 0.00–0.09)

## 2015-05-25 LAB — BHCG, QUANT PREGNANCY: BHCG, QUANT PREGNANCY: <2 m[IU]/mL

## 2015-05-25 LAB — ESTIMATED GFR
GFR,Black: >60 mL/min
GFR,Caucasian: >60 mL/min

## 2015-05-26 ENCOUNTER — Encounter: Payer: Self-pay | Admitting: Gastroenterology

## 2015-05-26 ENCOUNTER — Other Ambulatory Visit: Payer: Self-pay

## 2015-05-26 LAB — CBC
Hematocrit: 35 % (ref 35–47)
Hemoglobin: 10.9 g/dL — ABNORMAL LOW (ref 12.0–16.0)
MCH: 26.1 pg (ref 26.0–34.0)
MCHC: 31.1 g/dL (ref 31.0–37.5)
MCV: 84 fL (ref 80–100)
Platelets: 330 10*3/uL (ref 150–450)
RBC: 4.17 10*6/uL (ref 3.80–5.20)
RDW: 14.2 % (ref 0.0–15.2)
WBC: 11.5 10*3/uL — ABNORMAL HIGH (ref 4.0–11.0)

## 2015-05-26 LAB — POCT GLUCOSE
Glucose POCT: 170 mg/dL — ABNORMAL HIGH (ref 65–100)
Glucose POCT: 129 mg/dL — ABNORMAL HIGH (ref 65–100)
Glucose POCT: 209 mg/dL — ABNORMAL HIGH (ref 65–100)
Glucose POCT: 152 mg/dL — ABNORMAL HIGH (ref 65–100)

## 2015-05-26 LAB — TROPONIN T: Troponin T: <0.01 ng/mL (ref 0.00–0.09)

## 2015-05-26 LAB — BASIC METABOLIC PANEL
Anion Gap: 7 mEq/L (ref 4–16)
CO2: 22 mEq/L (ref 20–31)
Calcium: 8.9 mg/dL (ref 8.5–10.4)
Chloride: 112 mEq/L — ABNORMAL HIGH (ref 98–108)
Creatinine: 0.8 mg/dL (ref 0.5–0.9)
Glucose: 169 mg/dL — ABNORMAL HIGH (ref 65–100)
Lab: 9 mg/dL (ref 8–20)
Potassium: 4 mEq/L (ref 3.5–5.1)
Sodium: 141 mEq/L (ref 135–145)
UN/Creat Ratio: 11.3 — ABNORMAL LOW (ref 12.0–20.0)

## 2015-05-26 LAB — ESTIMATED GFR
GFR,Black: >60 mL/min
GFR,Caucasian: >60 mL/min

## 2015-05-26 LAB — BHCG, QUANT PREGNANCY: BHCG, QUANT PREGNANCY: <2 m[IU]/mL

## 2015-05-26 MED ORDER — BUSPIRONE HCL 30 MG PO TABS *I*
30.0000 mg | ORAL_TABLET | Freq: Two times a day (BID) | ORAL | 2 refills | Status: DC
Start: 2015-05-26 — End: 2015-09-04

## 2015-05-29 ENCOUNTER — Telehealth: Payer: Self-pay

## 2015-05-29 NOTE — Telephone Encounter (Signed)
I called the patient and left a message asking her to give our office a call back. The patient went to [facility] on 05/25/15 and was discharged 05/26/15. When the patient calls back please offer her a hospital follow up

## 2015-05-30 NOTE — Telephone Encounter (Signed)
The patient called back and said that she is coming in on Monday to see Dr. Satira Mccallum and she will address her hospital visit then.

## 2015-06-05 ENCOUNTER — Ambulatory Visit: Payer: Self-pay | Admitting: Family Medicine

## 2015-06-05 ENCOUNTER — Encounter: Payer: Self-pay | Admitting: Family Medicine

## 2015-06-05 VITALS — BP 126/80 | HR 76 | Temp 98.2°F | Ht 68.11 in | Wt 296.2 lb

## 2015-06-05 DIAGNOSIS — L0390 Cellulitis, unspecified: Secondary | ICD-10-CM

## 2015-06-05 DIAGNOSIS — E119 Type 2 diabetes mellitus without complications: Secondary | ICD-10-CM

## 2015-06-05 DIAGNOSIS — IMO0002 Reserved for concepts with insufficient information to code with codable children: Secondary | ICD-10-CM

## 2015-06-05 DIAGNOSIS — E785 Hyperlipidemia, unspecified: Secondary | ICD-10-CM

## 2015-06-05 LAB — LIPID PANEL
Chol/HDL Ratio: 4.5
Cholesterol: 150 mg/dL
HDL: 33 mg/dL
LDL Calculated: 73 mg/dL
Non HDL Cholesterol: 117 mg/dL
Triglycerides: 221 mg/dL — AB

## 2015-06-05 LAB — COMPREHENSIVE METABOLIC PANEL
ALT: 42 U/L — ABNORMAL HIGH (ref 0–35)
AST: 32 U/L (ref 0–35)
Albumin: 4.1 g/dL (ref 3.5–5.2)
Alk Phos: 118 U/L — ABNORMAL HIGH (ref 35–105)
Anion Gap: 16 (ref 7–16)
Bilirubin,Total: 0.3 mg/dL (ref 0.0–1.2)
CO2: 21 mmol/L (ref 20–28)
Calcium: 8.7 mg/dL — ABNORMAL LOW (ref 8.8–10.2)
Chloride: 103 mmol/L (ref 96–108)
Creatinine: 0.74 mg/dL (ref 0.51–0.95)
GFR,Black: 116 *
GFR,Caucasian: 101 *
Glucose: 247 mg/dL — ABNORMAL HIGH (ref 60–99)
Lab: 7 mg/dL (ref 6–20)
Potassium: 4.7 mmol/L (ref 3.3–5.1)
Sodium: 140 mmol/L (ref 133–145)
Total Protein: 6.8 g/dL (ref 6.3–7.7)
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
## 2015-06-06 ENCOUNTER — Encounter: Payer: Self-pay | Admitting: Family Medicine

## 2015-06-06 LAB — HEMOGLOBIN A1C: Hemoglobin A1C: 9.3 % — ABNORMAL HIGH (ref 4.0–6.0)

## 2015-06-07 ENCOUNTER — Ambulatory Visit: Payer: Self-pay

## 2015-06-07 DIAGNOSIS — G90A Postural orthostatic tachycardia syndrome (POTS): Secondary | ICD-10-CM

## 2015-06-07 DIAGNOSIS — Z8659 Personal history of other mental and behavioral disorders: Secondary | ICD-10-CM

## 2015-06-07 DIAGNOSIS — F6089 Other specific personality disorders: Secondary | ICD-10-CM

## 2015-06-07 DIAGNOSIS — F3341 Major depressive disorder, recurrent, in partial remission: Secondary | ICD-10-CM

## 2015-06-07 DIAGNOSIS — I498 Other specified cardiac arrhythmias: Secondary | ICD-10-CM

## 2015-06-07 DIAGNOSIS — Z658 Other specified problems related to psychosocial circumstances: Secondary | ICD-10-CM

## 2015-06-07 DIAGNOSIS — F429 Obsessive-compulsive disorder, unspecified: Secondary | ICD-10-CM

## 2015-06-08 ENCOUNTER — Other Ambulatory Visit: Payer: Self-pay

## 2015-06-08 MED ORDER — ARIPIPRAZOLE 15 MG PO TABS *I*
ORAL_TABLET | ORAL | 2 refills | Status: DC
Start: 2015-06-08 — End: 2015-06-12

## 2015-06-08 NOTE — BH Treatment Plan (Signed)
[facility] Treatment Plan     Date of Plan:   R PSY TP DATE FROM 06/08/2015   FROM 06/30/2015      Created/Updated On 06/08/2015   Next Treatment Plan Due 09/26/2015       Diagnostic Impression    ICD-10-CM ICD-9-CM   1. 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 OCD (obsessive compulsive disorder) F42.9 300.3   4. History of posttraumatic stress disorder (PTSD) Z86.59 V11.8   5. Interpersonal problem Z65.8 V62.81   6. POTS (postural orthostatic tachycardia syndrome) R00.0 427.89    I95.1        Strengths  Strengths derived from the assessment include: Patient is hard working, has supports.    Problem Areas  *At least one problem must be targeted toward risk reduction if Formulation of Risk or any other previous exam indicated special monitoring or intervention for suicide and/or violence risk indicated.    PROBLEM AREAS (choose and describe relevant):  THOUGHT: Negative  MOOD:Depressed and anxious  BEHAVIOR: Reactive and avoidant  ECONOMIC:SSDI  ________________________________________________________________  R PSY TP PROBLEM 06/08/2015   1ST TREATMENT PLAN PROBLEM Depression and anxiety        R PSY TP GOAL 06/08/2015   1ST TREATMENT PLAN GOAL Learn DBT and PST skills to manage affect       The rationale for addressing this problem is that resolving it will (select all that apply):  Reduce symptoms of disorder, Reduce functional impairment associated with disorder, Decrease likelihood of hospitalization, Facilitate transfer skills learned in therapy to everday life, Is a key motivational factor for the patient's participation in treatment, Reduce risk for suicide* and Reduce risk for violence*      Progress toward goal(s): Problem resolving. Comment: Patient's mood, thoughts, and behaviors have continued to improve. Patient reports reduced anxiety, less reliance on anti-anxiety medication and sleep medication. Patient  will continue to attend treatment monthly for the next 3 months with the goal of transitioning out of treatment within the 3 month period.    1 a. Measurable Objectives : Patient will make and keep regular individual therapy appointments.   Date established: 05/31/14   Target date: 09/26/15   Attained or Revised? continue    1 b. Measurable Objectives : Patient will continue to learn and utilize skills to manage affect.   Date established: 05/31/14   Target date: 09/26/15   Attained or Revised? continue    ______________________________________________________________________      Plan  TREATMENT MODALITIES:  Individual psychotherapy for 30-60 min Q 1-3 weeks with Tresa Res, LCSW.  Psychopharmacology visits 20-45 min Q 2-8 weeks with [name], NP.     DISCHARGE CRITERIA for this treatment setting: Patient reports a reduction in symptoms as evidenced by PHQ-9 and GAD-7 scores.    Clinician's name: Tresa Res, LCSW    Psychiatrist's Name: Kerri Perches, MD      Patient/Family Statement  PATIENT/FAMILY STATEMENT:  Obtain patient and family input into the treatment plan, including areas of agreement / disagreement.  Obtain patient's signature - if not possible, briefly describe the reason.     Patient Comments:          I HAVE PARTICIPATED IN THE DEVELOPMENT OF THIS TREATMENT PLAN AND I AGREE WITH ITS CONTENTS:       Patient Signature: _______________________________________________________    Date: ______________________________
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
## 2015-06-12 ENCOUNTER — Encounter: Payer: Self-pay | Admitting: Psychiatry

## 2015-06-12 ENCOUNTER — Ambulatory Visit: Payer: Self-pay | Admitting: Psychiatry

## 2015-06-12 VITALS — BP 133/70 | HR 78 | Resp 18 | Ht 68.11 in | Wt 295.0 lb

## 2015-06-12 DIAGNOSIS — F429 Obsessive-compulsive disorder, unspecified: Secondary | ICD-10-CM

## 2015-06-12 DIAGNOSIS — F6089 Other specific personality disorders: Secondary | ICD-10-CM

## 2015-06-12 DIAGNOSIS — F3341 Major depressive disorder, recurrent, in partial remission: Secondary | ICD-10-CM

## 2015-06-12 MED ORDER — ARIPIPRAZOLE 5 MG PO TABS *I*
5.0000 mg | ORAL_TABLET | Freq: Every day | ORAL | 1 refills | Status: DC
Start: 2015-06-12 — End: 2015-07-10

## 2015-06-12 NOTE — Progress Notes (Signed)
Behavioral Health Psychopharmacology Follow-up     Length of Session: 20 minutes.    Diagnosis Addressed    ICD-10-CM ICD-9-CM   1. 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 OCD (obsessive compulsive disorder) F42.9 300.3   3. Cluster B personality disorder F60.9 301.9       Recent History and Response to Medications  Patient states:   HPI     Follow-up    Additional comments: patient is here to follow up on her medications       Last edited by Shirlee More, LPN on 07/06/6710  4:58 PM. (History)          Current use of alcohol or drugs: No        Neurovegetative Symptoms Review:  Energy level: poor  Concentration: fair  Sleep Quality: good   Number of hours :  (6-8)  Appetite: good     Wt Readings from Last 3 Encounters:   06/12/15 133.8 kg (295 lb)   06/05/15 134.4 kg (296 lb 3.2 oz)   05/22/15 134.2 kg (295 lb 12.8 oz)     Enjoyment/interest: fair        Current Medications  Current Outpatient Prescriptions   Medication Sig    promethazine (PHENERGAN) 12.5 MG tablet Swish and swallow 12.5 mg    ARIPiprazole (ABILIFY) 15 MG tablet 1/2 tablet daily in the morning.    busPIRone (BUSPAR) 30 MG tablet Take 1 tablet (30 mg total) by mouth 2 times daily    insulin lispro (HUMALOG KWIKPEN) 100 UNIT/ML injection pen Inject 12 Units into the skin 3 times daily (before meals)    atorvastatin (LIPITOR) 40 MG tablet TAKE 1 TABLET (40 MG TOTAL) BY MOUTH DAILY (WITH DINNER)    insulin glargine (LANTUS SOLOSTAR) 100 UNIT/ML injection pen Inject 44 Units into the skin nightly    DULoxetine (CYMBALTA) 60 MG capsule Take 1 capsule (60 mg total) by mouth daily    pantoprazole (PROTONIX) 40 MG EC tablet TAKE 1 TABLET (40 MG TOTAL) BY MOUTH DAILY SWALLOW WHOLE. DO NOT CRUSH, BREAK, OR CHEW.    fluticasone (FLONASE) 50 MCG/ACT nasal spray 1 spray by Nasal route daily    mirtazapine (REMERON) 7.5 MG tablet Take 1 tablet (7.5 mg total) by mouth nightly    insulin pen needle (NOVOFINE)  30G X 8 MM Use 4 times a day as instructed.    dicyclomine (BENTYL) 20 MG tablet TAKE 1 TABLET (20 MG TOTAL) BY MOUTH 4 TIMES DAILY (BEFORE MEALS AND NIGHTLY)    midodrine (PROAMATINE) 10 MG tablet Take 10 mg by mouth 3 times daily    FREESTYLE LITE test strip Use four times daily as directed for 250.02    blood glucose monitor system Brand: cheapest brand available per her insurance.  Use as directed.    lancets Brand Free Style Lite; Use 2 times per day as directed for blood glucose testing.    atenolol (TENORMIN) 25 MG tablet Take 25 mg by mouth daily    Alcohol Swabs (ALCOHOL WIPES) PADS Use BID for BG check    topiramate (TOPAMAX) 100 MG tablet Take 100 mg by mouth 2 times daily    cetirizine (ZYRTEC) 10 MG tablet Take 10 mg by mouth daily    levonorgestrel (MIRENA) 20 MCG/24HR IUD 1 each by Intrauterine route once    SUMAtriptan (IMITREX) 50 MG tablet Take 1 tablet (50 mg total) by mouth as needed for Migraine   Take at  onset of headache. May repeat once in 2 hours.    Non-System Medication Helmet to prevent head injuries;   multiple syncopal episodes and hitting head repeatedly.    zolpidem (AMBIEN CR) 6.25 MG CR tablet Take 1 tablet (6.25 mg total) by mouth nightly as needed for Sleep   Max daily dose: 6.25 mg Swallow whole. Do not crush, break, or chew.    VENTOLIN HFA 108 (90 BASE) MCG/ACT inhaler Inhale 1-2 puffs into the lungs every 4-6 hours as needed for Wheezing    clonazePAM (KLONOPIN) 0.5 MG tablet Take 1 tablet (0.5 mg total) by mouth 2 times daily as needed   Max daily dose: 1 mg    SUMAtriptan refill (IMITREX STATDOSE) 6 MG/0.5ML injection Inject 6 mg into the skin once as needed for Migraine   May repeat once after 1 hour if needed.    famciclovir (FAMVIR) 500 MG tablet Take 1 tablet (500 mg total) by mouth 3 times daily as needed    Non-System Medication The above patient is followed in our clinic and cannot resume work permanently.     No current facility-administered  medications for this visit.        Side Effects  Patient Reported Side Effects: None reported    Mental Status  APPEARANCE: Casual  ATTITUDE TOWARD INTERVIEWER: Cooperative  MOTOR ACTIVITY: WNL (within normal limits)  EYE CONTACT: Direct  SPEECH: Normal rate and tone  AFFECT: Full Range and Appropriate  MOOD: Anxious and Depressed  THOUGHT PROCESS: Normal  THOUGHT CONTENT: No unusual themes  PERCEPTION: Within normal limits  ORIENTATION: Alert and Oriented X 3.  CONCENTRATION: Good  MEMORY:   Recent: intact   Remote: intact  COGNITIVE FUNCTION: Average intelligence  JUDGMENT: Intact  IMPULSE CONTROL: Fair  INSIGHT: Fair    Risk Assessment    Self Injury: Patient Denies  Suicidal Ideation: Patient Denies  Homicidal Ideation: Patient Denies  Aggressive Behavior: Patient Denies    Results  Results for Kaylee Harris, Kaylee Harris (MRN [id number]) as of 06/12/2015 14:31   Ref. Range 06/05/2015 15:38   Sodium Latest Ref Range: 133 - 145 mmol/L 140   Potassium Latest Ref Range: 3.3 - 5.1 mmol/L 4.7   Chloride Latest Ref Range: 96 - 108 mmol/L 103   CO2 Latest Ref Range: 20 - 28 mmol/L 21   Anion Gap Latest Ref Range: 7 - 16  16   UN Latest Ref Range: 6 - 20 mg/dL 7   Creatinine Latest Ref Range: 0.51 - 0.95 mg/dL 0.74   GFR,Black Latest Units: * 116   GFR,Caucasian Latest Units: * 101   Glucose Latest Ref Range: 60 - 99 mg/dL 247 (H)   Calcium Latest Ref Range: 8.8 - 10.2 mg/dL 8.7 (L)   Total Protein Latest Ref Range: 6.3 - 7.7 g/dL 6.8   Albumin Latest Ref Range: 3.5 - 5.2 g/dL 4.1   ALT Latest Ref Range: 0 - 35 U/L 42 (H)   AST Latest Ref Range: 0 - 35 U/L 32   Alk Phos Latest Ref Range: 35 - 105 U/L 118 (H)   Bilirubin,Total Latest Ref Range: 0.0 - 1.2 mg/dL 0.3   Cholesterol Latest Units: mg/dL 150   Triglycerides Latest Units: mg/dL 221 (!)   HDL Latest Units: mg/dL 33   LDL Calculated Latest Units: mg/dL 73   Non HDL Cholesterol Latest Units: mg/dL 117   Chol/HDL Ratio Unknown 4.5   Hemoglobin A1C Latest Ref Range: 4.0 - 6.0 % 9.3 (H)  Patient saw her blood work through [facility].      BP Readings from Last 3 Encounters:   06/12/15 133/70   06/05/15 126/80   05/22/15 110/76       Assessment  FORMULATION: Patient's blood sugars are increasing.  She says she has cut back on junk food and her weight has gone down.  We discussed that Abilify can contribute to the elevated glucose and trying to decrease the dose.  She say she is afraid that "I will not feel as happy." But she agree do try it due to the elevated HgA1C.  She is not using the Ambien since the mirtazapine is working for her so we discussed discontinuing it.  She also has not been needing her prn of Klonopin which Probation officer reinforced.      Recommendations/Plan and Rationale  PLAN: Decrease Abilify to 5 mg daily.  Discontinue Abilify.  Return in 4 weeks.

## 2015-06-13 ENCOUNTER — Ambulatory Visit: Payer: Self-pay | Admitting: Primary Care

## 2015-06-13 ENCOUNTER — Encounter: Payer: Self-pay | Admitting: Primary Care

## 2015-06-13 VITALS — BP 110/70 | HR 82 | Temp 97.7°F | Ht 67.11 in | Wt 296.0 lb

## 2015-06-13 DIAGNOSIS — F331 Major depressive disorder, recurrent, moderate: Secondary | ICD-10-CM

## 2015-06-13 DIAGNOSIS — IMO0002 Reserved for concepts with insufficient information to code with codable children: Secondary | ICD-10-CM

## 2015-06-13 DIAGNOSIS — R55 Syncope and collapse: Secondary | ICD-10-CM

## 2015-06-13 MED ORDER — INSULIN GLARGINE 100 UNIT/ML SC SOPN *I*
PEN_INJECTOR | SUBCUTANEOUS | 5 refills | Status: DC
Start: 2015-06-13 — End: 2015-08-07

## 2015-06-13 MED ORDER — INSULIN LISPRO (HUMAN) 100 UNIT/ML SC SOPN *I*
15.0000 [IU] | PEN_INJECTOR | Freq: Three times a day (TID) | SUBCUTANEOUS | 2 refills | Status: DC
Start: 2015-06-13 — End: 2015-07-12

## 2015-06-13 NOTE — Patient Instructions (Signed)
Change your lantus to two injections at the same time every day (22 + 22U)    Increase humalog to 15U

## 2015-06-14 LAB — HM DIABETES FOOT EXAM

## 2015-06-14 NOTE — Progress Notes (Signed)
[facility]    SUBJECTIVE    Pt is here to discuss:    Chief Complaint   Patient presents with    Diabetes     1. F/u DM -sugars previously in 200-300s during cellulitis episode in early January. Have since improved but not at goal. Shows me BG log of fasting sugars in the 140s-170s and postprandial in the 180s to 250s. Giving herself 42U Lantus qhs and 12U humalog TID.  recently saw her psychiatrist, who has been lowering her Abilify from 15 mg to 7.5 to 5 mg . Was upset about her recent A1c    Lab Results   Component Value Date    HA1C 9.3 (H) 06/05/2015    HA1C 7.7 (H) 11/08/2014    HA1C 7.9 (H) 09/15/2014    MALBR <0.30 09/15/2014    CREAT 0.74 06/05/2015    LDLC 73 06/05/2015     2. 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  Patient has since returned to ED and had repeat imaging, which showed resolution of this area.  This is confirmed by accessing care everywhere.       PMH / Family Hx / Social Hx  Patient's medications, allergies, problem list, past medical, social histories were reviewed and notable for:    Current Outpatient Prescriptions   Medication Sig Note    insulin lispro (HUMALOG KWIKPEN) 100 UNIT/ML injection pen Inject 12 Units into the skin 3 times daily (before meals)     insulin glargine 100 unit/mL injection pen Inject 44U once daily     promethazine (PHENERGAN) 12.5 MG tablet Swish and swallow 12.5 mg 06/12/2015: Received from: [facility]: Take 1 tablet by mouth every 8 (eight) hours as needed for Nausea / Vomiting.    ARIPiprazole (ABILIFY) 5 MG tablet Take 1 tablet (5 mg total) by mouth daily     busPIRone (BUSPAR) 30 MG tablet Take 1 tablet (30 mg total) by mouth 2 times daily     atorvastatin (LIPITOR) 40 MG tablet TAKE 1 TABLET (40 MG TOTAL) BY MOUTH DAILY (WITH DINNER)     Non-System Medication Helmet to prevent  head injuries;   multiple syncopal episodes and hitting head repeatedly.     DULoxetine (CYMBALTA) 60 MG capsule Take 1 capsule (60 mg total) by mouth daily     pantoprazole (PROTONIX) 40 MG EC tablet TAKE 1 TABLET (40 MG TOTAL) BY MOUTH DAILY SWALLOW WHOLE. DO NOT CRUSH, BREAK, OR CHEW.     fluticasone (FLONASE) 50 MCG/ACT nasal spray 1 spray by Nasal route daily     mirtazapine (REMERON) 7.5 MG tablet Take 1 tablet (7.5 mg total) by mouth nightly     insulin pen needle (NOVOFINE) 30G X 8 MM Use 4 times a day as instructed.     dicyclomine (BENTYL) 20 MG tablet TAKE 1 TABLET (20 MG TOTAL) BY MOUTH 4 TIMES DAILY (BEFORE MEALS AND NIGHTLY)     midodrine (PROAMATINE) 10 MG tablet Take 10 mg by mouth 3 times daily 02/27/2015: Received from: External Pharmacy    VENTOLIN HFA 108 (90 BASE) MCG/ACT inhaler Inhale 1-2 puffs into the lungs every 4-6 hours as needed for Wheezing     clonazePAM (KLONOPIN) 0.5 MG tablet Take 1 tablet (0.5 mg total) by mouth 2 times daily as needed   Max daily dose: 1 mg  FREESTYLE LITE test strip Use four times daily as directed for 250.02     blood glucose monitor system Brand: cheapest brand available per her insurance.  Use as directed.     lancets Brand Free Style Lite; Use 2 times per day as directed for blood glucose testing.     SUMAtriptan refill (IMITREX STATDOSE) 6 MG/0.5ML injection Inject 6 mg into the skin once as needed for Migraine   May repeat once after 1 hour if needed.     atenolol (TENORMIN) 25 MG tablet Take 25 mg by mouth daily     Alcohol Swabs (ALCOHOL WIPES) PADS Use BID for BG check     famciclovir (FAMVIR) 500 MG tablet Take 1 tablet (500 mg total) by mouth 3 times daily as needed     topiramate (TOPAMAX) 100 MG tablet Take 100 mg by mouth 2 times daily     cetirizine (ZYRTEC) 10 MG tablet Take 10 mg by mouth daily     levonorgestrel (MIRENA) 20 MCG/24HR IUD 1 each by Intrauterine route once     SUMAtriptan (IMITREX) 50 MG tablet Take 1 tablet  (50 mg total) by mouth as needed for Migraine   Take at onset of headache. May repeat once in 2 hours.     Non-System Medication The above patient is followed in our clinic and cannot resume work permanently.        OBJECTIVE  Vitals:    06/13/15 1257   BP: 110/70   Pulse: 82   Temp: 36.5 C (97.7 F)   TempSrc: Temporal   Weight: 134.3 kg (296 lb)   Height: 1.705 m (5' 7.11")     Body mass index is 46.21 kg/(m^2).      General: well-appearing Caucasian female, pleasant & conversant, in NAD, here with partner Shari.well dressed and smiling today.  Using a wheeled walker appropriately.    Ext: L first toe without evidence of persistent cellulitis.  Some evidence of ingrown toenail along medial nail border   Psych: AAOx3, normal affect and mood.  does appear to deflate when we focused on her medical issues.  Insight and judgement intact.           ASSESSMENT & PLAN  1. DM (diabetes mellitus), type 2, uncontrolled  2. Depression, major, recurrent, moderate   Suggested splitting Lantus dose into 22+22 units, to total the same amount of medication but deliver in 2 separate spots.  Follow-up via my chart with fasting sugars in 3 days.  Also encouraged to increase her mealtime insulins from 12 to 15 units, an include these in her my chart update later this week.  We can continue titration of her insulins via my chart, and she has follow-up with [facility] in 2 weeks.  Patient talking about VGO device that was previously brought up to her by diabetic educators.  I reviewed that I do not have experience with this, but will ask them for further input and potential for referral for this.  Also hopeful that with the decrease in her antipsychotic, her hyperglycemia might also respond.  Continue care by psychiatry otherwise    3. 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 Non-arrythmic per Dr Lovena Le 10/2014  4. Brain contusion   reviewed that this resolved on repeat imaging.  Continue care per cardiology            Follow-up: 3 months in office re:DM, sooner via my chart      [name], MD  [facility]  Family Medicine  06/14/2015  3:52 PM        ______________________

## 2015-06-16 ENCOUNTER — Encounter: Payer: Self-pay | Admitting: Primary Care

## 2015-06-28 ENCOUNTER — Ambulatory Visit: Payer: Self-pay | Admitting: Nutrition

## 2015-06-28 ENCOUNTER — Ambulatory Visit
Admission: RE | Admit: 2015-06-28 | Discharge: 2015-06-28 | Disposition: A | Discharge status: Home / Self Care | Payer: Self-pay | Source: Ambulatory Visit | Admitting: Nutrition

## 2015-06-28 NOTE — Patient Instructions (Addendum)
Smuckers Sugar Free Preserves, Strawberry     Breakfast  Protein with toast- egg or PB, or cheese  PB on pancakes instead of syrup  Cottage cheese+ fruit    Lunch  Grilled cheese- Swiss    dinner  1-2 starch servings per meal= 15-30g carbs   Plate Picture    Snacks  See snack list- 1 serving   Will need insulin to cover carbs when on VGO  Protein or veggie snack- no insulin       CHECK WITH INS ABOUT COST  GLP 1 Trulicity or Victoza  VGO

## 2015-06-28 NOTE — Progress Notes (Signed)
Kaylee Harris is here today for Kaylee Harris Nutrition Therapy regarding Insulin requiring Type 2 Diabetes. Referred by: PCP  Here with wife, Kaylee Harris  Interested in starting VGO insulin delivery  Considering GLP-1 depending on cost     Wt Readings from Last 3 Encounters:   06/28/15 134.3 kg (296 lb)   06/13/15 134.3 kg (296 lb)   06/05/15 134.4 kg (296 lb 3.2 oz)     Lab Results   Component Value Date    HA1C 9.3 (H) 06/05/2015    HA1C 7.7 (H) 11/08/2014    HA1C 7.9 (H) 09/15/2014    MALBR <0.30 09/15/2014    CREAT 0.74 06/05/2015    LDLC 73 06/05/2015       Diet: grazing on snacks not going to program anymore; 3 meals close together 11 am 1 pm and 5 pm  Beverages: Powerade Zero  ETOH: no  Exercise : sedentary  ; dizzy spells  DM Meds: Lantus 46 split units pm humalog 17 units each meal   Taking as prescribed.       Meter:  Freestyle lite 4 time daily,   before breakfast and 2 HR after meal    Assessment:   DM- BGs lower since MD increased insulin;   14d avg= 180 FBS 160-173; pp meals 127= salmon , veg, sm cornbread; highest= pp pancakes + syrup   Splitting 46 units Lantus 2 shots but no diffrence in BGS; can return to 1 shot  VGO- start after instruction from RN CDE 08/09/15    Obesity  - stable but wants to lose; GLP 1 good choice; pt will let us know what is covered   Meals- are OK but eating too many snacks; reviewed healthier choices; instructed will need to cover carb snacks with VGO   Exercise very limited      Coun/Edu:     Barriers to learning:  Neuro problems  Education Provided: Plate Method label reading and carb factors    Plan:   Nutrition Goals:   1500 calories  150-180 gm carbs  Weight goal: (-10%) 270 lb  Patient Instructions   Smuckers Sugar Free Preserves, Strawberry     Breakfast  Protein with toast- egg or PB, or cheese  PB on pancakes instead of syrup  Cottage cheese+ fruit    Lunch  Grilled cheese- Swiss    dinner  1-2 starch servings per meal= 15-30g carbs   Plate Picture    Snacks  See snack  list- 1 serving   Will need insulin to cover carbs when on VGO  Protein or veggie snack- no insulin       CHECK WITH INS ABOUT COST  GLP 1 Trulicity or Victoza  VGO        Follow-up visit:  5 weeks with RN CDE  Time spent counseling patient: 65 minutes    MVPO changing to [place], RD, CDE

## 2015-07-03 ENCOUNTER — Ambulatory Visit: Payer: Self-pay

## 2015-07-03 ENCOUNTER — Telehealth: Payer: Self-pay

## 2015-07-03 NOTE — Telephone Encounter (Signed)
Pt called to cancel appt today at 2pm due to having dizzy spells. Pt has rescheduled for 3/13 at 4pm

## 2015-07-04 ENCOUNTER — Encounter: Payer: Self-pay | Admitting: Gastroenterology

## 2015-07-06 ENCOUNTER — Encounter: Payer: Self-pay | Admitting: Primary Care

## 2015-07-06 ENCOUNTER — Encounter: Payer: Self-pay | Admitting: Registered Nurse

## 2015-07-06 DIAGNOSIS — IMO0002 Reserved for concepts with insufficient information to code with codable children: Secondary | ICD-10-CM

## 2015-07-07 ENCOUNTER — Encounter: Payer: Self-pay | Admitting: Primary Care

## 2015-07-07 DIAGNOSIS — IMO0002 Reserved for concepts with insufficient information to code with codable children: Secondary | ICD-10-CM

## 2015-07-07 MED ORDER — DULAGLUTIDE 0.75 MG/0.5ML SC SOAJ *I*
0.7500 mg | SUBCUTANEOUS | 1 refills | Status: DC
Start: 2015-07-07 — End: 2015-08-31

## 2015-07-10 ENCOUNTER — Ambulatory Visit: Payer: Self-pay | Admitting: Psychiatry

## 2015-07-10 ENCOUNTER — Ambulatory Visit
Admission: RE | Admit: 2015-07-10 | Discharge: 2015-07-10 | Disposition: A | Discharge status: Home / Self Care | Payer: Self-pay | Source: Ambulatory Visit | Attending: Primary Care | Admitting: Primary Care

## 2015-07-10 ENCOUNTER — Encounter: Payer: Self-pay | Admitting: Psychiatry

## 2015-07-10 VITALS — BP 120/76 | HR 80 | Resp 18 | Ht 67.13 in | Wt 295.0 lb

## 2015-07-10 DIAGNOSIS — F6089 Other specific personality disorders: Secondary | ICD-10-CM

## 2015-07-10 DIAGNOSIS — F3341 Major depressive disorder, recurrent, in partial remission: Secondary | ICD-10-CM

## 2015-07-10 DIAGNOSIS — IMO0002 Reserved for concepts with insufficient information to code with codable children: Secondary | ICD-10-CM

## 2015-07-10 DIAGNOSIS — F429 Obsessive-compulsive disorder, unspecified: Secondary | ICD-10-CM

## 2015-07-10 LAB — TSH: TSH: 1.31 u[IU]/mL (ref 0.27–4.20)

## 2015-07-10 MED ORDER — ARIPIPRAZOLE 15 MG PO TABS *I*
ORAL_TABLET | ORAL | 0 refills | Status: DC
Start: 2015-07-10 — End: 2015-08-07

## 2015-07-10 MED ORDER — LAMOTRIGINE 25 MG PO TABS *I*
ORAL_TABLET | ORAL | 0 refills | Status: DC
Start: 2015-07-10 — End: 2015-08-07

## 2015-07-10 NOTE — Progress Notes (Signed)
Behavioral Health Psychopharmacology Follow-up     Length of Session: 20 minutes.    Diagnosis Addressed    ICD-10-CM ICD-9-CM   1. 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 OCD (obsessive compulsive disorder) F42.9 300.3   3. Cluster B personality disorder F60.9 301.9       Recent History and Response to Medications  Patient states:   HPI     Follow-up    Additional comments: patient is here to follow up on her medications       Last edited by Shirlee More, LPN on [id number]  [id number] PM. (History)          Current use of alcohol or drugs: No        Neurovegetative Symptoms Review:  Energy level: poor  Concentration: poor  Sleep Quality: fair   Number of hours :  (6)  Appetite: fair     Wt Readings from Last 3 Encounters:   07/10/15 133.8 kg (295 lb)   06/28/15 134.3 kg (296 lb)   06/13/15 134.3 kg (296 lb)     Enjoyment/interest: poor        Current Medications  Current Outpatient Prescriptions   Medication Sig    Dulaglutide (TRULICITY) [id number] SOPN Inject 0.75 mg into the skin once a week    insulin lispro (HUMALOG KWIKPEN) 100 UNIT/ML injection pen Inject 15 Units into the skin 3 times daily (before meals)    insulin glargine 100 unit/mL injection pen Inject 22 U and 22 U in separate sites once daily (same time of day, MDD 44U)    promethazine (PHENERGAN) 12.5 MG tablet Swish and swallow 12.5 mg    ARIPiprazole (ABILIFY) 5 MG tablet Take 1 tablet (5 mg total) by mouth daily    busPIRone (BUSPAR) 30 MG tablet Take 1 tablet (30 mg total) by mouth 2 times daily    atorvastatin (LIPITOR) 40 MG tablet TAKE 1 TABLET (40 MG TOTAL) BY MOUTH DAILY (WITH DINNER)    Non-System Medication Helmet to prevent head injuries;   multiple syncopal episodes and hitting head repeatedly.    DULoxetine (CYMBALTA) 60 MG capsule Take 1 capsule (60 mg total) by mouth daily    pantoprazole (PROTONIX) 40 MG EC tablet TAKE 1 TABLET (40 MG TOTAL) BY MOUTH DAILY SWALLOW WHOLE. DO NOT  CRUSH, BREAK, OR CHEW.    fluticasone (FLONASE) 50 MCG/ACT nasal spray 1 spray by Nasal route daily    mirtazapine (REMERON) 7.5 MG tablet Take 1 tablet (7.5 mg total) by mouth nightly    insulin pen needle (NOVOFINE) 30G X 8 MM Use 4 times a day as instructed.    dicyclomine (BENTYL) 20 MG tablet TAKE 1 TABLET (20 MG TOTAL) BY MOUTH 4 TIMES DAILY (BEFORE MEALS AND NIGHTLY)    midodrine (PROAMATINE) 10 MG tablet Take 10 mg by mouth 3 times daily    FREESTYLE LITE test strip Use four times daily as directed for 250.02    blood glucose monitor system Brand: cheapest brand available per her insurance.  Use as directed.    lancets Brand Free Style Lite; Use 2 times per day as directed for blood glucose testing.    atenolol (TENORMIN) 25 MG tablet Take 25 mg by mouth daily    Alcohol Swabs (ALCOHOL WIPES) PADS Use BID for BG check    topiramate (TOPAMAX) 100 MG tablet Take 100 mg by mouth 2 times daily    cetirizine (ZYRTEC) 10 MG tablet Take  10 mg by mouth daily    levonorgestrel (MIRENA) 20 MCG/24HR IUD 1 each by Intrauterine route once    Non-System Medication The above patient is followed in our clinic and cannot resume work permanently.    VENTOLIN HFA 108 (90 BASE) MCG/ACT inhaler Inhale 1-2 puffs into the lungs every 4-6 hours as needed for Wheezing    clonazePAM (KLONOPIN) 0.5 MG tablet Take 1 tablet (0.5 mg total) by mouth 2 times daily as needed   Max daily dose: 1 mg    SUMAtriptan refill (IMITREX STATDOSE) 6 MG/0.5ML injection Inject 6 mg into the skin once as needed for Migraine   May repeat once after 1 hour if needed.    famciclovir (FAMVIR) 500 MG tablet Take 1 tablet (500 mg total) by mouth 3 times daily as needed    SUMAtriptan (IMITREX) 50 MG tablet Take 1 tablet (50 mg total) by mouth as needed for Migraine   Take at onset of headache. May repeat once in 2 hours.     No current facility-administered medications for this visit.        Side Effects  Patient Reported Side Effects: None  reported    Mental Status  APPEARANCE: Casual  ATTITUDE TOWARD INTERVIEWER: Cooperative  MOTOR ACTIVITY: WNL (within normal limits)  EYE CONTACT: Direct  SPEECH: Normal rate and tone  AFFECT: Full Range and Appropriate  MOOD: Anxious and Depressed  THOUGHT PROCESS: Normal  THOUGHT CONTENT: No unusual themes  PERCEPTION: Within normal limits  ORIENTATION: Alert and Oriented X 3.  CONCENTRATION: Good  MEMORY:   Recent: intact   Remote: intact  COGNITIVE FUNCTION: Average intelligence  JUDGMENT: Intact  IMPULSE CONTROL: Poor  INSIGHT: Good and Fair    Risk Assessment    Self Injury: Patient Denies  Suicidal Ideation: Yes. Describe: brief thoughts but denies planning or intent  Homicidal Ideation: Patient Denies  Aggressive Behavior: Patient Denies    Results  none    BP Readings from Last 3 Encounters:   07/10/15 120/76   06/13/15 110/70   06/12/15 133/70       Assessment  FORMULATION: Patient reports a significant change in her mood with the decreased in ability with feeling more anxiety and depressed but also being much more irritable and having difficulty not acting on the irritability.  We discussed starting a mood stabilizer since we are still trying to minimize the use of the Abilify due to her increased lipids and glucose level.  Writer reviewed the therapeutic effects as well as potential side effects of Lamictal and gave patient the information sheet from [employer].  Patient was educated specifically about the risk for stevens Johnson syndrome and agreed to Secondary school teacher if she develops any signs of a rash.  We discussed temporarily increasing the Abilify back to 7.5 until the Lamictal is at a therapeutic level and then trying to decrease it again to which she agreed.    Recommendations/Plan and Rationale  PLAN:  Increase Abilify back to 7.5 mg daily.  Begin Lamictal 25 mg daily for 2 week and then 50 mg daily.  Return in 4 weeks.

## 2015-07-11 ENCOUNTER — Telehealth: Payer: Self-pay | Admitting: Registered Nurse

## 2015-07-11 LAB — HEMOGLOBIN A1C: Hemoglobin A1C: 9 % — ABNORMAL HIGH (ref 4.0–6.0)

## 2015-07-11 NOTE — Telephone Encounter (Signed)
Offered 2pm Thursday 0000000 for [place]

## 2015-07-12 ENCOUNTER — Other Ambulatory Visit: Payer: Self-pay | Admitting: Registered Nurse

## 2015-07-12 ENCOUNTER — Encounter: Payer: Self-pay | Admitting: Primary Care

## 2015-07-12 ENCOUNTER — Ambulatory Visit: Payer: Self-pay | Admitting: Registered Nurse

## 2015-07-12 ENCOUNTER — Telehealth: Payer: Self-pay

## 2015-07-12 DIAGNOSIS — E119 Type 2 diabetes mellitus without complications: Secondary | ICD-10-CM

## 2015-07-12 MED ORDER — INSULIN LISPRO (HUMAN) 100 UNIT/ML IJ/SC SOLN *WRAPPED*
SUBCUTANEOUS | 5 refills | Status: DC
Start: 2015-07-12 — End: 2015-07-14

## 2015-07-12 NOTE — Telephone Encounter (Signed)
Patient called stating that Gay Filler (DM provider) was suppose to send a message over to Dr. Laurance Flatten for a script for 3 vials of Humalog. Patient states that she was suppose to have the medication before her appointment for today at 2:30 with Gay Filler. Writer explained to patient that the provider was seeing patients and isn't sure that the Rx can be sent before her scheduled appointment. Patient can be reached at [phone].

## 2015-07-12 NOTE — Telephone Encounter (Signed)
Rx sent    [name], MD  [facility]  07/12/2015  1:51 PM

## 2015-07-13 ENCOUNTER — Ambulatory Visit: Payer: Self-pay | Admitting: Registered Nurse

## 2015-07-14 ENCOUNTER — Encounter: Payer: Self-pay | Admitting: Primary Care

## 2015-07-14 DIAGNOSIS — E119 Type 2 diabetes mellitus without complications: Secondary | ICD-10-CM

## 2015-07-14 MED ORDER — INSULIN LISPRO (HUMAN) 100 UNIT/ML IJ/SC SOLN *WRAPPED*
SUBCUTANEOUS | 5 refills | Status: DC
Start: 2015-07-14 — End: 2015-08-11

## 2015-07-14 NOTE — Telephone Encounter (Signed)
Clarified with pharmacy that this prescription was not received.  Requested provider to resend.

## 2015-07-14 NOTE — Telephone Encounter (Signed)
Humalog reordered.

## 2015-07-17 ENCOUNTER — Ambulatory Visit: Payer: Self-pay | Admitting: Registered Nurse

## 2015-07-17 ENCOUNTER — Ambulatory Visit
Admission: RE | Admit: 2015-07-17 | Discharge: 2015-07-17 | Disposition: A | Discharge status: Home / Self Care | Payer: Self-pay | Source: Ambulatory Visit | Attending: Nutrition | Admitting: Nutrition

## 2015-07-17 ENCOUNTER — Other Ambulatory Visit: Payer: Self-pay | Admitting: Registered Nurse

## 2015-07-17 ENCOUNTER — Ambulatory Visit: Payer: Self-pay

## 2015-07-17 DIAGNOSIS — F339 Major depressive disorder, recurrent, unspecified: Secondary | ICD-10-CM

## 2015-07-17 DIAGNOSIS — E119 Type 2 diabetes mellitus without complications: Secondary | ICD-10-CM

## 2015-07-17 NOTE — Progress Notes (Signed)
Kaylee Harris 40 y.o. female was seen today for diabetes education regarding  uncontrolled type 2 diabetes mellitus. VGO insulin start today; individual visits; new to Medicare.    Diabetes Meds: reviewed in eRecord; patient is taking as prescribed. Trulicity A999333 mg q week; Lantus pen 46 units @ hs; Humalog kwik pen 17units tid meals to start VGO insulin delivery system today (VGO 30 today based on body wt >220 lbs); may need VGO40 in future    Lab Results   Component Value Date    HA1C 9.0 (H) 07/10/2015    HA1C 9.3 (H) 06/05/2015    HA1C 7.7 (H) 11/08/2014    MALBR <0.30 09/15/2014    CREAT 0.74 06/05/2015    LDLC 73 06/05/2015        Self glucose monitoring:  FreeStyle lite BG meter 3 times daily. fbs 145 today - 160; lowest 99; highest 254; rare 300;   BG today: now 182 mg/dl 2 hr PP meal & H 17 units    Diet: 3 meals per day  Current Activity : did not address  Smoking: no Alcohol: no did not inquire     Self foot check: no   Eye Exam inquiry: no     Assessment:   Based on body wt >220 lbs, guidelines recommend VGO 30 to start & to max bolus doses (36 units max)  Proper filling of VGO device & skin placement on abdomen  Partner present & both demonstrate proper understanding of basal / bolus system  2 units = 1 click  Will try 4 - 6 clicks (AC B & L);  Largest meal dinner, 6 clicks  Agrees to send bGs weekly  Future 72hr CGM to eval trends with VGO  Proper skill with Trulicity A999333 mg injection today (demo'd)    Coun/Edu:     BARRIERS THAT AFFECT LEARNING:  None  READINESS TO LEARN:  good  PREFERRED METHODS OF LEARNING:  reading, demonstration and visual tools  EDUCATION PROVIDED: insulin teaching, BG Goals, using ICR and CF to determine meal bolus and future CGM; risk reduction; skin care with VGO    Plan:     Patient Instructions   1.  Stop all future doses Lantus & use of humalog kwik pen    2.  VGO,  Dinner, 6 clicks (Q000111Q Before meal)      B & L 4- 6 units          If BG 123XX123 - Q000111Q      4 clicks (8  units)                              123XX123 - A999333      5 clicks (10 units)                              201 or .         6 clicks (99991111    3.  Send BGs in 1 week   [email or web address]    Or call [phone]    4.  Bring meter                                              DSMT SUPPORT PLAN:  Take advantage of your  annual Medicare benefits for DSMT and MNT  Follow-up visit:  08/09/15 at [facility] location  Time spent counseling patient: 60 minutes    Medicare initial MD order in eRecord 07/17/15. Expires 07/16/16  Initial year DSMT for Medicare  DSMT  individual 1 hr; 9 individual hrs remaining    Jordi Lacko C Furqan Gosselin, RN, CDE

## 2015-07-17 NOTE — Progress Notes (Signed)
Behavioral Health Progress Note     LENGTH OF SESSION: 30 minutes    Contact Type:  Location: On Site    Face to Face     Problem(s)/Goals Addressed from Treatment Plan:    Problem 1:   R PSY TP PROBLEM 06/08/2015   1ST TREATMENT PLAN PROBLEM Depression and anxiety       Goal for this problem:    R PSY TP GOAL 06/08/2015   1ST TREATMENT PLAN GOAL Learn DBT and PST skills to manage affect       Progress towards this goal: Problem resolving. Comment: Patient reports managing mood fluctuation due to medication change.    Mental Status Exam:  APPEARANCE: Appears stated age, Well-groomed, Casual  ATTITUDE TOWARD INTERVIEWER: Cooperative  MOTOR ACTIVITY: WNL (within normal limits)  EYE CONTACT: Direct  SPEECH: Normal rate and tone  AFFECT: Full Range  MOOD: Neutral  THOUGHT PROCESS: Circumstantial  THOUGHT CONTENT: No unusual themes and Negative Rumination  PERCEPTION: No evidence of hallucinations  CURRENT SUICIDAL IDEATION: patient denies  CURRENT HOMICIDAL IDEATION: Patient denies  ORIENTATION: Alert and Oriented X 3.  CONCENTRATION: Good  MEMORY:   Recent: intact   Remote: intact  COGNITIVE FUNCTION: Average intelligence  JUDGMENT: Intact  IMPULSE CONTROL: Good  INSIGHT: Good    Risk Assessment:  ASSESSMENT OF RISK FOR SUICIDAL BEHAVIOR  Changes in risk for suicide from baseline Formulation of Risk and/or previous intake, including newly identified risk, if any: none  Violence risk was assessed and No Change noted from baseline formulation of risk and/or previous assessment.    Session Content:: Patient reports managing mood fluctuation due to medication change. She said that family members have told her about "staring" episodes that she attributes to medication change as well. Patient told Probation officer that she is following recommendations for medication and that although she was worried about becoming depressed, overall her mood has been good. She said that she has only had a couple, fleeting thoughts of SI. Patient told  Probation officer that she did not attend her last appointment due to passing out just prior. She said that her doctor has also been adjusting this medication. Patient talked about going to [place] with her parents and sister for a week to visit cousins. She said that she is eager to take this trip. Writer and patient discussed the shift in family dynamics as patient and her sister had previously had interpersonal issues, but have now aligned due to their brother's behavior. Writer and patient brainstormed ways in which patient can address an issue related to her deceased sister-in-law's belongings, e.g. Email. Patient told Probation officer that she has a new diabetes medication and this helps with suppressing her appetite. She said that she has reduced her intake of sweets as well. Writer commended patient on her progress.    Visit Diagnosis:      ICD-10-CM ICD-9-CM   1. MDD (major depressive disorder), recurrent episode F33.9 296.30       Interventions:  Dialectical Behavioral Therapy (DBT), Supportive Psychotherapy, Solution Focused therapy    Current Treatment Plan   Created/Updated On 06/08/2015   Next Treatment Plan Due 09/26/2015     Plan:  Psychotherapy continues as described in care plan; plan remains the same.    NEXT APPT: 08/07/15 @1       Tresa Res, LCSW

## 2015-07-17 NOTE — Patient Instructions (Addendum)
1.  Stop all future doses Lantus & use of humalog kwik pen    2.  VGO,  Dinner, 6 clicks (Q000111Q Before meal)      B & L 4- 6 units          If BG 123XX123 - Q000111Q      4 clicks (8 units)                              123XX123 - A999333      5 clicks (10 units)                              201 or .         6 clicks (99991111    3.  Send BGs in 1 week   [email or web address]    Or call [phone]    4.  Bring meter

## 2015-07-19 ENCOUNTER — Encounter: Payer: Self-pay | Admitting: Registered Nurse

## 2015-07-20 ENCOUNTER — Encounter: Payer: Self-pay | Admitting: Primary Care

## 2015-07-20 DIAGNOSIS — IMO0002 Reserved for concepts with insufficient information to code with codable children: Secondary | ICD-10-CM

## 2015-07-20 MED ORDER — V-GO 30 KIT
PACK | 11 refills | Status: DC
Start: 2015-07-20 — End: 2015-10-11

## 2015-07-24 ENCOUNTER — Telehealth: Payer: Self-pay | Admitting: Registered Nurse

## 2015-07-24 NOTE — Telephone Encounter (Signed)
Sent bGs since using VGO 30 insulin device;    BGs 130-150 using dosing at B & L 4-6 clicks (123XX123 units) & dinner 6 clicks (12 units )    FU 08/09/15 with 72hr cgm

## 2015-07-30 ENCOUNTER — Other Ambulatory Visit: Payer: Self-pay | Admitting: Family Medicine

## 2015-07-30 ENCOUNTER — Other Ambulatory Visit: Payer: Self-pay | Admitting: Psychiatry

## 2015-07-31 ENCOUNTER — Other Ambulatory Visit: Payer: Self-pay | Admitting: Psychiatry

## 2015-07-31 MED ORDER — MIRTAZAPINE 7.5 MG PO TABS *A*
7.5000 mg | ORAL_TABLET | Freq: Every evening | ORAL | 2 refills | Status: DC
Start: 2015-07-31 — End: 2015-09-20

## 2015-08-07 ENCOUNTER — Ambulatory Visit: Payer: Self-pay

## 2015-08-07 ENCOUNTER — Ambulatory Visit: Payer: Self-pay | Admitting: Psychiatry

## 2015-08-07 ENCOUNTER — Encounter: Payer: Self-pay | Admitting: Psychiatry

## 2015-08-07 VITALS — BP 131/72 | HR 72 | Resp 18 | Ht 67.13 in | Wt 294.0 lb

## 2015-08-07 DIAGNOSIS — F603 Borderline personality disorder: Secondary | ICD-10-CM

## 2015-08-07 DIAGNOSIS — F429 Obsessive-compulsive disorder, unspecified: Secondary | ICD-10-CM

## 2015-08-07 DIAGNOSIS — F3341 Major depressive disorder, recurrent, in partial remission: Secondary | ICD-10-CM

## 2015-08-07 DIAGNOSIS — F331 Major depressive disorder, recurrent, moderate: Secondary | ICD-10-CM

## 2015-08-07 MED ORDER — LAMOTRIGINE 100 MG PO TABS *I*
100.0000 mg | ORAL_TABLET | Freq: Every day | ORAL | 0 refills | Status: DC
Start: 2015-08-07 — End: 2015-09-04

## 2015-08-07 MED ORDER — ARIPIPRAZOLE 15 MG PO TABS *I*
ORAL_TABLET | ORAL | 0 refills | Status: DC
Start: 2015-08-07 — End: 2015-10-12

## 2015-08-07 NOTE — Progress Notes (Signed)
Behavioral Health Progress Note     LENGTH OF SESSION: 30 minutes    Contact Type:  Location: On Site    Face to Face     Problem(s)/Goals Addressed from Treatment Plan:    Problem 1:   R PSY TP PROBLEM 06/08/2015   1ST TREATMENT PLAN PROBLEM Depression and anxiety       Goal for this problem:    R PSY TP GOAL 06/08/2015   1ST TREATMENT PLAN GOAL Learn DBT and PST skills to manage affect       Progress towards this goal: Problem resolving. Comment: Patient reports stable mood.    Mental Status Exam:  APPEARANCE: Appears stated age, Well-groomed, Casual  ATTITUDE TOWARD INTERVIEWER: Cooperative  MOTOR ACTIVITY: WNL (within normal limits)  EYE CONTACT: Direct  SPEECH: Normal rate and tone  AFFECT: Full Range  MOOD: Lively and Neutral  THOUGHT PROCESS: Circumstantial  THOUGHT CONTENT: No unusual themes  PERCEPTION: No evidence of hallucinations  CURRENT SUICIDAL IDEATION: patient denies  CURRENT HOMICIDAL IDEATION: Patient denies  ORIENTATION: Alert and Oriented X 3.  CONCENTRATION: Good  MEMORY:   Recent: intact   Remote: intact  COGNITIVE FUNCTION: Average intelligence  JUDGMENT: Intact  IMPULSE CONTROL: Good  INSIGHT: Good    Risk Assessment:  ASSESSMENT OF RISK FOR SUICIDAL BEHAVIOR  Changes in risk for suicide from baseline Formulation of Risk and/or previous intake, including newly identified risk, if any: none  Violence risk was assessed and No Change noted from baseline formulation of risk and/or previous assessment.    Session Content:: Patient reports stable mood. She said that the new medication is working well with no noticeable side effects. Patient talked about a recent issue between her brother, parents, and sister. She said that she did not react as she has in the past and is able to still go over and see them. Writer commended patient on her progress and reinforced patient's effective use of skills. Patient talked about her upcoming vacation with her parents and sister. She said that she is looking  forward to this and she has all of her distractions packed. Writer gave patient Mindfulness handouts panic in body and emotions.    Visit Diagnosis:      ICD-10-CM ICD-9-CM   1. MDD (major depressive disorder), recurrent, in partial remission F33.41 296.35     Interventions:  Dialectical Behavioral Therapy (DBT), Identified adaptive/maladaptive family patterns, Provided Psychoeducation, Supportive Psychotherapy, Taught/practiced communication skills (specify skills used):  interpersonal, Solution Focused therapy    Current Treatment Plan   Created/Updated On 06/08/2015   Next Treatment Plan Due 09/26/2015         Plan:  Psychotherapy continues as described in care plan; plan remains the same.    NEXT APPT: 08/28/15 @2       Tresa Res, LCSW

## 2015-08-07 NOTE — Progress Notes (Signed)
Behavioral Health Psychopharmacology Follow-up     Length of Session: 20 minutes.    Diagnosis Addressed    ICD-10-CM ICD-9-CM   1. 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 Borderline personality disorder F60.3 301.83   3. OCD (obsessive compulsive disorder) F42.9 300.3       Recent History and Response to Medications  Patient states:   HPI     Follow-up    Additional comments: patient is here to follow up on her medications       Last edited by Shirlee More, LPN on 08/07/100  7:25 PM. (History)          Current use of alcohol or drugs: No        Neurovegetative Symptoms Review:  Energy level: fair  Concentration: fair  Sleep Quality: poor   Number of hours :  (5)  Appetite: fair     Wt Readings from Last 3 Encounters:   08/07/15 133.4 kg (294 lb)   07/10/15 133.8 kg (295 lb)   06/28/15 134.3 kg (296 lb)     Enjoyment/interest: fair        Current Medications  Current Outpatient Prescriptions   Medication Sig    mirtazapine (REMERON) 7.5 MG tablet Take 1 tablet (7.5 mg total) by mouth nightly    Insulin Disposable Pump (V-GO 30) KIT Use daily; 30ct/month.    insulin lispro (HUMALOG) 100 UNIT/ML injection vial Use as directed for VGO    lamoTRIgine (LAMICTAL) 25 MG tablet Take 1 tablet daily for 14 days then 2 tablets daily.    ARIPiprazole (ABILIFY) 15 MG tablet Take 1/2 tablet daily in the morning.    Dulaglutide (TRULICITY) 3.66 YQ/0.3KV SOPN Inject 0.75 mg into the skin once a week    promethazine (PHENERGAN) 12.5 MG tablet Swish and swallow 12.5 mg    busPIRone (BUSPAR) 30 MG tablet Take 1 tablet (30 mg total) by mouth 2 times daily    atorvastatin (LIPITOR) 40 MG tablet TAKE 1 TABLET (40 MG TOTAL) BY MOUTH DAILY (WITH DINNER)    DULoxetine (CYMBALTA) 60 MG capsule Take 1 capsule (60 mg total) by mouth daily    pantoprazole (PROTONIX) 40 MG EC tablet TAKE 1 TABLET (40 MG TOTAL) BY MOUTH DAILY SWALLOW WHOLE. DO NOT CRUSH, BREAK, OR CHEW.    fluticasone (FLONASE) 50 MCG/ACT nasal  spray 1 spray by Nasal route daily    dicyclomine (BENTYL) 20 MG tablet TAKE 1 TABLET (20 MG TOTAL) BY MOUTH 4 TIMES DAILY (BEFORE MEALS AND NIGHTLY)    midodrine (PROAMATINE) 10 MG tablet Take 10 mg by mouth 3 times daily    VENTOLIN HFA 108 (90 BASE) MCG/ACT inhaler Inhale 1-2 puffs into the lungs every 4-6 hours as needed for Wheezing    FREESTYLE LITE test strip Use four times daily as directed for 250.02    blood glucose monitor system Brand: cheapest brand available per her insurance.  Use as directed.    lancets Brand Free Style Lite; Use 2 times per day as directed for blood glucose testing.    atenolol (TENORMIN) 25 MG tablet Take 25 mg by mouth daily    Alcohol Swabs (ALCOHOL WIPES) PADS Use BID for BG check    topiramate (TOPAMAX) 100 MG tablet Take 100 mg by mouth 2 times daily    cetirizine (ZYRTEC) 10 MG tablet Take 10 mg by mouth daily    levonorgestrel (MIRENA) 20 MCG/24HR IUD 1 each by Intrauterine route once    Non-System  Medication Helmet to prevent head injuries;   multiple syncopal episodes and hitting head repeatedly.    clonazePAM (KLONOPIN) 0.5 MG tablet Take 1 tablet (0.5 mg total) by mouth 2 times daily as needed   Max daily dose: 1 mg    SUMAtriptan refill (IMITREX STATDOSE) 6 MG/0.5ML injection Inject 6 mg into the skin once as needed for Migraine   May repeat once after 1 hour if needed.    famciclovir (FAMVIR) 500 MG tablet Take 1 tablet (500 mg total) by mouth 3 times daily as needed    SUMAtriptan (IMITREX) 50 MG tablet Take 1 tablet (50 mg total) by mouth as needed for Migraine   Take at onset of headache. May repeat once in 2 hours.    Non-System Medication The above patient is followed in our clinic and cannot resume work permanently.     No current facility-administered medications for this visit.        Side Effects  Patient Reported Side Effects: None reported    Mental Status  APPEARANCE: Casual  ATTITUDE TOWARD INTERVIEWER: Cooperative  MOTOR ACTIVITY: WNL  (within normal limits)  EYE CONTACT: Direct  SPEECH: Normal rate and tone  AFFECT: Full Range and Appropriate  MOOD: Anxious and Depressed  THOUGHT PROCESS: Normal  THOUGHT CONTENT: No unusual themes  PERCEPTION: Within normal limits  ORIENTATION: To Person  CONCENTRATION: Fair  MEMORY:   Recent: intact   Remote: intact  COGNITIVE FUNCTION: Average intelligence  JUDGMENT: Intact  IMPULSE CONTROL: Fair  INSIGHT: Fair    Risk Assessment    Self Injury: Patient Denies  Suicidal Ideation: Patient Denies  Homicidal Ideation: Patient Denies  Aggressive Behavior: Patient Denies    Results  none    BP Readings from Last 3 Encounters:   08/07/15 131/72   07/10/15 120/76   06/13/15 110/70       Assessment  FORMULATION: Patient reports that she thinks the Lamictal has helped her mood. She reports feeling much less irritability and has not been having any suicidal thoughts for the last few weeks.  Since she is responding well to the Lamictal and the long term goal is to be able to minimize the use of Abilify, we discussed increasing the dose of the Lamictal to 100 mg.  Patient was in agreement with trying this.   She says she has not been sleeping though the night since stopping the Ambien.  We discussed that if the difficulty sleeping is in part related to mood instability, her sleep may improve on the increased dose of Lamictal.    Recommendations/Plan and Rationale  PLAN: Increase Lamictal to 100 mg daily.  Return in 4 weeks.

## 2015-08-08 ENCOUNTER — Encounter: Payer: Self-pay | Admitting: Primary Care

## 2015-08-08 ENCOUNTER — Ambulatory Visit: Payer: Self-pay | Admitting: Primary Care

## 2015-08-08 VITALS — BP 120/80 | HR 81 | Temp 97.8°F | Ht 67.3 in | Wt 294.0 lb

## 2015-08-08 DIAGNOSIS — N764 Abscess of vulva: Secondary | ICD-10-CM

## 2015-08-08 MED ORDER — SULFAMETHOXAZOLE-TRIMETHOPRIM 800-160 MG PO TABS *I*
1.0000 | ORAL_TABLET | Freq: Two times a day (BID) | ORAL | 0 refills | Status: AC
Start: 2015-08-08 — End: 2015-08-15

## 2015-08-08 NOTE — Procedures (Signed)
PROCEDURE NOTE: [facility]    Procedure: I&D of suspected L labial abscess  Location: L labia majora  Indication: increasing pain, swelling, redness, exam suggestive of abscess    Consent: Prior to proceeding, the patient/guardian was counseled on risks (bleeding, infection, adverse reaction to medication), benefits (removal of infection), and alternatives (watching, treating with PO abx, or referral to OBGYN/surgery) and agreed to procedure.  Informed consent was signed prior to the procedure being performed.    Procedure: Using clean technique, the lesion was initially cleaned with hibaclens and then anesthetized with 3 ml of 1% lidocaine with epinephrine.  Once appropriately anesthetized, the area of induration was punctured at the site of the sinus tract using 18 gauge needle and syringe. No purulent drainage was aspirated. Pt had pain within the subcutaneous tissue during aspiration, therefore additional 2cc of lidocaine was administered into deeper tissues. Additional punctures with needle tracing superiorly and inferiorly were again unsuccessful at yielding any purulent drainage. Palpation of the area did not yield any other sites of fluctuance requiring repeated puncture/incision. No additional incisions or exploration was done due to low concern of focal pocket of infection. Patient tolerated procedure well with minimal pain or discomfort.  There were no immediate post-procedure complications.     Correct pt (2 identifiers)? Y  Correct procedure/site? Y  Correct materials? Y  Participants? Patient, [name], MD , Tanna Savoy MA  Time started: 11:19am  Time ended: 11:37am        Post Procedure Instructions: Patient was counseled on ice and NSAID use for discomfort, PO abx for infection and warm baths/compresses for encouragement of drainage. She will f/u in 3d for reassessment to see if there is an underlying sebaceous cyst that was inflammed, which might explain the inflammation but  lack of focal infection on her exam/procedure today. If she develops fevers, excessive purulent drainage despite PO abx, or the patient has any other concerns she was informed to call our office.    [name], MD  [place]

## 2015-08-08 NOTE — Patient Instructions (Signed)
Abscess Incision and Drainage Discharge Instructions    About this topic   An abscess is a collection of fluid that is infected. This can happen anywhere in your body. If it is on the surface, your skin will be red, raised, and hurt when you touch it. If the collection is deeper in your body, you may not be able to easily tell that you have an abscess. It will need to be drained to:  · Get rid of the infection from the body  · Check the fluid in the wound or area  · Make large areas smaller     What care is needed at home?   · Ask your doctor what you need to do when you go home. Make sure you ask questions if you do not understand what the doctor says. This way you will know what you need to do.  · Take all your drugs as ordered by your doctor.  · Talk to your doctor about how to care for your cut site. Ask your doctor about:  ¨ When you should change your bandages  ¨ When you may take a bath or shower  ¨ If you need to be careful with lifting things over 10 pounds  ¨ When you may go back to your normal activities like work or driving  · Be sure to wash your hands before and after touching your wound or dressing.  · If the infection was in an arm or leg, keep it raised above the level of the heart. This will help reduce swelling.  What follow-up care is needed?   · Your doctor may ask you to make visits to the office to check on your progress. Be sure to keep these visits.  · Your doctor may change or take out your packing a few days after the procedure.  · If you go home with a drain, the tube will be removed by your doctor after drainage has stopped and the infection is gone.  · If you have stitches or staples, you will need to have them taken out. Your doctor will often want to do this in 1 to 2 weeks.  What drugs may be needed?   Your doctor may order drugs to:  · Help with pain  · Fight an infection  Will physical activity be limited?   · You may have to limit your activity to help your wound heal. Talk  to your doctor about the right amount of activity for you.  · Splints may be needed if you have an abscess in your arm, hand, or leg. This will help control motion to let the wound heal faster.  What problems could happen?   · Bleeding  · Scarring  · Infection may not clear up  What can be done to prevent this health problem?   · Practice proper hygiene. Practice good hand washing.  · Do not share towels, razors, and other personal things with someone else.  · Be careful when you shave your face, underarms, or legs to avoid scratching or cutting your skin.  · If you cut yourself, keep the area clean and put antibiotic ointment on it.  When do I need to call the doctor?   · Signs of infection. These include a fever of 100.4°F (38°C) or higher, chills, wound that will not heal.  · Signs of wound infection. These include swelling, redness, warmth around the wound; too much pain when touched; yellowish, greenish, or bloody discharge;   foul smell coming from the cut site; cut site opens up.  · A new rash  Teach Back: Helping You Understand   The Teach Back Method helps you understand the information we are giving you. The idea is simple. After talking with the staff, tell them in your own words what you were just told. This helps to make sure the staff has covered each thing clearly. It also helps to explain things that may have been a bit confusing. Before going home, make sure you are able to do these:  · I can tell you about my procedure.  · I can tell you how to care for my cut site.  · I can tell you what I will do if I have swelling, redness, or warmth around my wound.  Where can I learn more?   NHS Choices  http://www.nhs.uk/Conditions/Abscess/Pages/Introduction.aspx   Last Reviewed Date   2011-11-01  Consumer Information Use and Disclaimer   This information is not specific medical advice and does not replace information you receive from your health care provider. This is only a brief summary of general  information. It does NOT include all information about conditions, illnesses, injuries, tests, procedures, treatments, therapies, discharge instructions or life-style choices that may apply to you. You must talk with your health care provider for complete information about your health and treatment options. This information should not be used to decide whether or not to accept your health care provider’s advice, instructions or recommendations. Only your health care provider has the knowledge and training to provide advice that is right for you.  Copyright   Copyright © 2015 [employer] and its affiliates and/or licensors. All rights reserved.

## 2015-08-08 NOTE — Progress Notes (Signed)
[facility]    SUBJECTIVE    Pt is here to discuss:    Chief Complaint   Patient presents with    Vaginal Pain         1. L labial abscess - present x2 days, increasing in size, causing worsening discomfort. No current drainage, fevers, chills. Has h/o this years ago at same site (?[address]). Recalls that she was sent to ED given her h/o DM and treated with IV abx. No current vaginal d/c, dysuria, hematuria. Doesn't shave the genital area    PMH / Family Hx / Social Hx  Patient's medications, allergies, problem list, past medical, social histories were reviewed and notable for:    Current Outpatient Prescriptions   Medication Sig Note    ARIPiprazole (ABILIFY) 15 MG tablet Take 1/2 tablet daily in the morning.     lamoTRIgine (LAMICTAL) 100 MG tablet Take 1 tablet (100 mg total) by mouth daily     mirtazapine (REMERON) 7.5 MG tablet Take 1 tablet (7.5 mg total) by mouth nightly     Insulin Disposable Pump (V-GO 30) KIT Use daily; 30ct/month.     insulin lispro (HUMALOG) 100 UNIT/ML injection vial Use as directed for VGO     Dulaglutide (TRULICITY) 04.16MSA/6.3KZSOPN Inject 0.75 mg into the skin once a week     promethazine (PHENERGAN) 12.5 MG tablet Swish and swallow 12.5 mg 06/12/2015: Received from: [facility] Take 1 tablet by mouth every 8 (eight) hours as needed for Nausea / Vomiting.    busPIRone (BUSPAR) 30 MG tablet Take 1 tablet (30 mg total) by mouth 2 times daily     atorvastatin (LIPITOR) 40 MG tablet TAKE 1 TABLET (40 MG TOTAL) BY MOUTH DAILY (WITH DINNER)     Non-System Medication Helmet to prevent head injuries;   multiple syncopal episodes and hitting head repeatedly.     DULoxetine (CYMBALTA) 60 MG capsule Take 1 capsule (60 mg total) by mouth daily     pantoprazole (PROTONIX) 40 MG EC tablet TAKE 1 TABLET (40 MG TOTAL) BY MOUTH DAILY SWALLOW WHOLE. DO NOT CRUSH, BREAK, OR CHEW.     fluticasone (FLONASE) 50 MCG/ACT nasal spray 1 spray by Nasal  route daily     dicyclomine (BENTYL) 20 MG tablet TAKE 1 TABLET (20 MG TOTAL) BY MOUTH 4 TIMES DAILY (BEFORE MEALS AND NIGHTLY)     midodrine (PROAMATINE) 10 MG tablet Take 10 mg by mouth 3 times daily 02/27/2015: Received from: External Pharmacy    VENTOLIN HFA 108 (90 BASE) MCG/ACT inhaler Inhale 1-2 puffs into the lungs every 4-6 hours as needed for Wheezing     clonazePAM (KLONOPIN) 0.5 MG tablet Take 1 tablet (0.5 mg total) by mouth 2 times daily as needed   Max daily dose: 1 mg     FREESTYLE LITE test strip Use four times daily as directed for 250.02     blood glucose monitor system Brand: cheapest brand available per her insurance.  Use as directed.     lancets Brand Free Style Lite; Use 2 times per day as directed for blood glucose testing.     SUMAtriptan refill (IMITREX STATDOSE) 6 MG/0.5ML injection Inject 6 mg into the skin once as needed for Migraine   May repeat once after 1 hour if needed.     atenolol (TENORMIN) 25 MG tablet Take 25 mg by mouth daily     Alcohol Swabs (ALCOHOL WIPES) PADS Use BID for BG  check     famciclovir (FAMVIR) 500 MG tablet Take 1 tablet (500 mg total) by mouth 3 times daily as needed     topiramate (TOPAMAX) 100 MG tablet Take 100 mg by mouth 2 times daily     cetirizine (ZYRTEC) 10 MG tablet Take 10 mg by mouth daily     levonorgestrel (MIRENA) 20 MCG/24HR IUD 1 each by Intrauterine route once     SUMAtriptan (IMITREX) 50 MG tablet Take 1 tablet (50 mg total) by mouth as needed for Migraine   Take at onset of headache. May repeat once in 2 hours.     Non-System Medication The above patient is followed in our clinic and cannot resume work permanently.          OBJECTIVE  Vitals:    08/08/15 1050   BP: 120/80   Pulse: 81   Temp: 36.6 C (97.8 F)   TempSrc: Temporal   Weight: 133.4 kg (294 lb)   Height: 1.709 m (5' 7.3")     Body mass index is 45.64 kg/(m^2).      General: well-appearing obese Caucasian female, pleasant & conversant, in NAD  GU; L labia majora  with large 2cm area of induration at superior aspect, tracking in oblong shape along labia majora. +flat yellow discolored head and small sinus tract appreciated but no active drainage with manipulation. Vaginal orifice normal appearing. No fistula into vagina. Bartholin glands normal. No extension to labia minora. No vaginal d/c at introitus.  Psych: AAOx3, normal affect and mood. Insight and judgement intact.           ASSESSMENT & PLAN  1. Left genital labial abscess  Attempted I&D (see PROC note) with 18 gauge needle into area of induration. No purulent drainage yielded despite multiple passes throughout. Pt was given rx for PO antibiotics as I did not feels she required dissection or exploration given no focal area of infection found with attempted needle aspiration. I wonder if this is an inflammed sebaceous cyst. Given her uncontrolled DM, f/u closely on Friday to review effect. Recommended ice and NSAID for pain relief, warm baths or compresses 1-2x daily to encourage drainage, and monitoring for effect of abx. Will reexamine and see once induration is down if there is an underlying cyst that harbored this inflammatory/infection response and could be removed  - sulfamethoxazole-trimethoprim (BACTRIM DS,SEPTRA DS) 800-160 MG per tablet; Take 1 tablet by mouth 2 times daily for 7 days  Dispense: 14 tablet; Refill: 0      Follow-up: Friday      [name], MD  [facility]  08/08/2015  11:40 AM        ______________________

## 2015-08-09 ENCOUNTER — Telehealth: Payer: Self-pay

## 2015-08-09 ENCOUNTER — Ambulatory Visit
Admission: RE | Admit: 2015-08-09 | Discharge: 2015-08-09 | Disposition: A | Discharge status: Home / Self Care | Payer: Self-pay | Source: Ambulatory Visit | Admitting: Nutrition

## 2015-08-09 ENCOUNTER — Ambulatory Visit: Payer: Self-pay | Admitting: Registered Nurse

## 2015-08-09 ENCOUNTER — Encounter: Payer: Self-pay | Admitting: Gastroenterology

## 2015-08-09 DIAGNOSIS — IMO0002 Reserved for concepts with insufficient information to code with codable children: Secondary | ICD-10-CM

## 2015-08-09 NOTE — Patient Instructions (Addendum)
1.  While wearing CGM device,  Check blood sugar 4 x day & record time/bG  & food eaten & clicks of VGO    2.  Mail back to [facility] office; see address    3.  Cont clicks, per plan  VGO,  Dinner, 6 clicks (Q000111Q Before meal)                                        B & L 4- 6 units             If BG 123XX123 - Q000111Q      4 clicks (8 units)                                123XX123 - A999333      5 clicks (10 units)                                201 or .         6 clicks (123456    4.  Await coupon for 1 free vial humalog but need script to with it (diff pharmacy)

## 2015-08-09 NOTE — Progress Notes (Signed)
Kaylee Harris 40 y.o. female was seen today for diabetes education regarding  uncontrolled type 2 diabetes mellitus. Last seen 07/17/15 for DSMT & new to  VGO insulin start  + Trulicity injection teaching.  individual visits; new to Medicare as of 07/17/15 visit; per order 72hr CGM today.    Diabetes Meds: reviewed in eRecord; patient is taking as prescribed. Trulicity A999333 mg q week;Humalog vial in VGO insulin delivery system (1 click = 2 units); recall B & L 4 clicks; dinner 6 clicks.      Lab Results   Component Value Date    HA1C 9.0 (H) 07/10/2015    HA1C 9.3 (H) 06/05/2015    HA1C 7.7 (H) 11/08/2014    MALBR <0.30 09/15/2014    CREAT 0.74 06/05/2015    LDLC 73 06/05/2015     Self glucose monitoring:  FreeStyle lite BG meter 3 times daily. fbs 117 today ; AC D lowest 88; Hs 123 -145;   BG today: now 145 mg/dl 2 hr PP meal & did 4 clicks     Diet: 3 meals per day wt loss 2 lbs since 4 weeks & start GLP1 injectable; less hungry  Current Activity : did not address  Smoking: no Alcohol: no did not inquire     Self foot check: no   Eye Exam inquiry: no     Assessment:   Questions regarding loading VGO 30 device & skill reviewed with return competency   Proper filling of VGO device & skin placement on abdomen; some skin irritation & advised re; Rx  Partner present & both demonstrate proper understanding of basal / bolus system  2 units = 1 click  Will cont  4 - 6 clicks (AC B & L);  Largest meal dinner, 6 clicks   0000000 CGM to eval trends with VGO      Coun/Edu:     BARRIERS THAT AFFECT LEARNING:  None  READINESS TO LEARN:  good  PREFERRED METHODS OF LEARNING:  reading, demonstration and visual tools  EDUCATION PROVIDED: insulin teaching, BG Goals, using ICR and CF to determine meal bolus and future CGM; risk reduction; skin care with VGO    Plan:     Patient Instructions   1.  While wearing CGM device,  Check blood sugar 4 x day & record time/bG  & food eaten & clicks of VGO    2.  Mail back to [facility] office; see  address    3.  Cont clicks, per plan  VGO,  Dinner, 6 clicks (Q000111Q Before meal)                                        B & L 4- 6 units             If BG 123XX123 - Q000111Q      4 clicks (8 units)                                123XX123 - A999333      5 clicks (10 units)                                201 or .         6 clicks (123456  4.  Await coupon for 1 free vial humalog but need script to with it (diff pharmacy)      DSMT SUPPORT PLAN:  Take advantage of your annual Medicare benefits for DSMT and MNT  Follow-up visit:  08/09/15 at [facility] location  Time spent counseling patient: 60 minutes    Medicare initial MD order in eRecord 07/17/15. Expires 07/16/16  Initial year DSMT for Medicare  DSMT  individual 1 hr; 8 1/2  individual hrs remaining    Amaranta Mehl C Jasmeet Gehl, RN, CDE

## 2015-08-09 NOTE — Telephone Encounter (Signed)
Processed forms and placed in Dr. Tawanna Sat office for completion, No Release on file so please call pt when completed after placing a copy into scanning.

## 2015-08-09 NOTE — Telephone Encounter (Signed)
Received paper from patient would like dr Laurance Flatten to fll out grant for medications will call patient once complete

## 2015-08-10 NOTE — Telephone Encounter (Signed)
The paperwork has been completed by Dr. Laurance Flatten. I have made a copy of the forms and placed them in scanning. I called the patient and let her know that the paperwork is ready for pick up. The patient's paperwork is in the patient pick up folder.

## 2015-08-11 ENCOUNTER — Encounter: Payer: Self-pay | Admitting: Registered Nurse

## 2015-08-11 ENCOUNTER — Ambulatory Visit: Payer: Self-pay | Admitting: Primary Care

## 2015-08-11 ENCOUNTER — Encounter: Payer: Self-pay | Admitting: Primary Care

## 2015-08-11 ENCOUNTER — Other Ambulatory Visit: Payer: Self-pay

## 2015-08-11 VITALS — BP 122/84 | HR 72 | Temp 98.4°F | Ht 67.32 in | Wt 293.6 lb

## 2015-08-11 DIAGNOSIS — E119 Type 2 diabetes mellitus without complications: Secondary | ICD-10-CM

## 2015-08-11 DIAGNOSIS — N764 Abscess of vulva: Secondary | ICD-10-CM

## 2015-08-11 MED ORDER — INSULIN LISPRO (HUMAN) 100 UNIT/ML IJ/SC SOLN *WRAPPED*
SUBCUTANEOUS | 0 refills | Status: DC
Start: 2015-08-11 — End: 2015-09-13

## 2015-08-11 MED ORDER — INSULIN LISPRO (HUMAN) 100 UNIT/ML IJ/SC SOLN *WRAPPED*
SUBCUTANEOUS | 0 refills | Status: DC
Start: 2015-08-11 — End: 2015-08-11

## 2015-08-11 NOTE — Progress Notes (Signed)
[facility]    SUBJECTIVE    Pt is here to discuss:    Chief Complaint   Patient presents with    Follow-up     abscess    micro al due 09/15/15     1. F/u labial abscess - tolerating Bactrim, has had interval improvement in size of swelling and amount of discomfort.  Has not resolved however     2. DM - requesting single vial of Humalog to a different pharmacy, an effort to bridge her until the pharmacy aide comes through (forms completed earlier this week for that).     PMH / Family Hx / Social Hx  Patient's medications, allergies, problem list, past medical, social histories were reviewed and notable for:    Current Outpatient Prescriptions   Medication Sig Note    insulin lispro (HUMALOG) 100 UNIT/ML injection vial For VGO: Dinner, 6 clicks ; B & L 4- 6 units. BG 517 - 616 4 clicks (8u), 073 - 710 5 clicks (62I), >948. 6 clicks (54O). MDD 36 click (27O)     sulfamethoxazole-trimethoprim (BACTRIM DS,SEPTRA DS) 800-160 MG per tablet Take 1 tablet by mouth 2 times daily for 7 days     ARIPiprazole (ABILIFY) 15 MG tablet Take 1/2 tablet daily in the morning.     lamoTRIgine (LAMICTAL) 100 MG tablet Take 1 tablet (100 mg total) by mouth daily     mirtazapine (REMERON) 7.5 MG tablet Take 1 tablet (7.5 mg total) by mouth nightly     Insulin Disposable Pump (V-GO 30) KIT Use daily; 30ct/month.     Dulaglutide (TRULICITY) 3.50 KX/3.8HW SOPN Inject 0.75 mg into the skin once a week     promethazine (PHENERGAN) 12.5 MG tablet Swish and swallow 12.5 mg 06/12/2015: Received from: [facility]: Take 1 tablet by mouth every 8 (eight) hours as needed for Nausea / Vomiting.    busPIRone (BUSPAR) 30 MG tablet Take 1 tablet (30 mg total) by mouth 2 times daily     atorvastatin (LIPITOR) 40 MG tablet TAKE 1 TABLET (40 MG TOTAL) BY MOUTH DAILY (WITH DINNER)     Non-System Medication Helmet to prevent head injuries;   multiple syncopal episodes and hitting head repeatedly.      DULoxetine (CYMBALTA) 60 MG capsule Take 1 capsule (60 mg total) by mouth daily     pantoprazole (PROTONIX) 40 MG EC tablet TAKE 1 TABLET (40 MG TOTAL) BY MOUTH DAILY SWALLOW WHOLE. DO NOT CRUSH, BREAK, OR CHEW.     fluticasone (FLONASE) 50 MCG/ACT nasal spray 1 spray by Nasal route daily     dicyclomine (BENTYL) 20 MG tablet TAKE 1 TABLET (20 MG TOTAL) BY MOUTH 4 TIMES DAILY (BEFORE MEALS AND NIGHTLY)     midodrine (PROAMATINE) 10 MG tablet Take 10 mg by mouth 3 times daily 02/27/2015: Received from: External Pharmacy    VENTOLIN HFA 108 (90 BASE) MCG/ACT inhaler Inhale 1-2 puffs into the lungs every 4-6 hours as needed for Wheezing     clonazePAM (KLONOPIN) 0.5 MG tablet Take 1 tablet (0.5 mg total) by mouth 2 times daily as needed   Max daily dose: 1 mg     FREESTYLE LITE test strip Use four times daily as directed for 250.02     blood glucose monitor system Brand: cheapest brand available per her insurance.  Use as directed.     lancets Brand Free Style Lite; Use 2 times per day as directed for blood glucose  testing.     SUMAtriptan refill (IMITREX STATDOSE) 6 MG/0.5ML injection Inject 6 mg into the skin once as needed for Migraine   May repeat once after 1 hour if needed.     atenolol (TENORMIN) 25 MG tablet Take 25 mg by mouth daily     Alcohol Swabs (ALCOHOL WIPES) PADS Use BID for BG check     famciclovir (FAMVIR) 500 MG tablet Take 1 tablet (500 mg total) by mouth 3 times daily as needed     topiramate (TOPAMAX) 100 MG tablet Take 100 mg by mouth 2 times daily     cetirizine (ZYRTEC) 10 MG tablet Take 10 mg by mouth daily     levonorgestrel (MIRENA) 20 MCG/24HR IUD 1 each by Intrauterine route once     SUMAtriptan (IMITREX) 50 MG tablet Take 1 tablet (50 mg total) by mouth as needed for Migraine   Take at onset of headache. May repeat once in 2 hours.     Non-System Medication The above patient is followed in our clinic and cannot resume work permanently.        OBJECTIVE  Vitals:     08/11/15 1110   BP: 122/84   BP Location: Left arm   Patient Position: Sitting   Cuff Size: large adult   Pulse: 72   Temp: 36.9 C (98.4 F)   TempSrc: Temporal   Weight: 133.2 kg (293 lb 9.6 oz)   Height: 1.71 m (5' 7.32")     Body mass index is 45.54 kg/(m^2).      General: well-appearing obese Caucasian female, pleasant & conversant, in NAD  GU: Left labia majora with minimal asymmetric indurated swelling, tracks inferiorly towards introitus.  No bright erythema.  Previous site of entry using 18-gauge is now healed over.  Previous pustular head/sinus tract appears healed.  No current drainage.  Psych: AAOx3, normal affect and mood. Insight and judgement intact.           ASSESSMENT & PLAN  1. Labial abscess   Attempted I&D/aspiration was unsuccessful earlier this week. Improving with PO bactrim.  Recommended she complete course of antibiotics, and we will reexamine her in May.  Consider removal of sebaceous cyst if that is found to have been the nidus of this inflammatory versus infectious episode.    2. DMII (diabetes mellitus, type 2)  Refill provided per request  - insulin lispro (HUMALOG) 100 UNIT/ML injection vial; For VGO: Dinner, 6 clicks ; B & L 4- 6 units. BG 947 - 654 4 clicks (8u), 650 - 354 5 clicks (65K), >812. 6 clicks (75T). MDD 36 click (70Y)  Dispense: 30 mL; Refill: 0        [name], MD  [facility]  08/11/2015  11:49 AM        ______________________

## 2015-08-11 NOTE — Telephone Encounter (Signed)
Received a med request from [facility] requesting clarification for the humalog 100 unit ml. Med request states that the directions needs to be in units. Please call [facility] at [phone]. Thank you!

## 2015-08-11 NOTE — Telephone Encounter (Signed)
Rewritten  Kaylee Drilling, MD  [facility]  08/11/2015  2:48 PM

## 2015-08-11 NOTE — Telephone Encounter (Signed)
Patient came into the office for an appointment today and received the copy of the paperwork.

## 2015-08-17 ENCOUNTER — Ambulatory Visit
Admission: RE | Admit: 2015-08-17 | Discharge: 2015-08-17 | Disposition: A | Discharge status: Home / Self Care | Payer: Self-pay | Source: Ambulatory Visit | Admitting: Nutrition

## 2015-08-17 ENCOUNTER — Telehealth: Payer: Self-pay

## 2015-08-17 NOTE — Telephone Encounter (Signed)
Patient still has not received Humalog coupon you were sending her in the mail.  She is anxious as she is leaving for vacation.  She wanted to know if she could please come here today to pick up a coupon?    Thank you.

## 2015-08-28 ENCOUNTER — Ambulatory Visit: Payer: Self-pay

## 2015-08-28 DIAGNOSIS — F3341 Major depressive disorder, recurrent, in partial remission: Secondary | ICD-10-CM

## 2015-08-28 NOTE — Progress Notes (Signed)
Behavioral Health Progress Note     LENGTH OF SESSION: 45 minutes    Contact Type:  Location: On Site    Face to Face     Problem(s)/Goals Addressed from Treatment Plan:    Problem 1:   R PSY TP PROBLEM 06/08/2015   1ST TREATMENT PLAN PROBLEM Depression and anxiety       Goal for this problem:    R PSY TP GOAL 06/08/2015   1ST TREATMENT PLAN GOAL Learn DBT and PST skills to manage affect       Progress towards this goal: Problem resolving. Comment: Patient reports a stable mood most days.    Mental Status Exam:  APPEARANCE: Appears stated age, Well-groomed, Casual  ATTITUDE TOWARD INTERVIEWER: Cooperative  MOTOR ACTIVITY: WNL (within normal limits)  EYE CONTACT: Direct  SPEECH: Normal rate and tone  AFFECT: Full Range  MOOD: Depressed, Lively and Neutral  THOUGHT PROCESS: Circumstantial  THOUGHT CONTENT: No unusual themes and Negative Rumination  PERCEPTION: No evidence of hallucinations  CURRENT SUICIDAL IDEATION: patient denies  CURRENT HOMICIDAL IDEATION: Patient denies  ORIENTATION: Alert and Oriented X 3.  CONCENTRATION: Good  MEMORY:   Recent: intact   Remote: intact  COGNITIVE FUNCTION: Average intelligence  JUDGMENT: Intact  IMPULSE CONTROL: Fair  INSIGHT: Fair    Risk Assessment:  ASSESSMENT OF RISK FOR SUICIDAL BEHAVIOR  Changes in risk for suicide from baseline Formulation of Risk and/or previous intake, including newly identified risk, if any: none  Violence risk was assessed and No Change noted from baseline formulation of risk and/or previous assessment.    Session Content:: Patient told writer that she had a good trip to [place] with only one incident with her mother "yelling" at her. She said that she went to her room and "cried my eyes out for about an hour", but did not have any SI. Patient said that her sister monitored her medication for the trip. Writer and patient discussed what was going on emotionally for patient during this incident and patient was able to break down her role. Patient said  that she feels she has to stick up for her father because she is a "daddy's girl". Writer and patient discussed patient's relationship with her father and the triangulation that can occur. Patient said that she has not had any SI for quite some time and can't remember the last time she did. Writer and patient discussed whether or not patient would continue with group. Patient said that group makes her anxious as she struggled in school and there is homework. 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 Writer suggested the Solution Focused group.    Visit Diagnosis:      ICD-10-CM ICD-9-CM   1. 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    NEXT APPT: 09/20/15 @2       Tresa Res, LCSW

## 2015-08-29 NOTE — Progress Notes (Signed)
[facility]  ADULT AMBULATORY GROUP THERAPY SERVICE REFERRAL     Identifying Data:  Patient: Kaylee Harris Record Number: [id number]  DOB: October 21, 1975  Address: [address]  [place] [address]     Patient Phone: [phone] (home) [phone] (work)    Insurance: Payor: / No coverage found.       Check where patient is currently open in the system:     []   [facility]     []   [facility]     []   [employer]    []   [facility]     []   [facility]    []   [facility]     []   [facility]     Diagnosis:     ICD-10-CM ICD-9-CM   1. 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 No    Past PSYCHIATRIC HISTORY: Inpatient, Partial, Outpatient  Previous GROUP EXPERIENCE: Yes       Risk Factors:  Suicide or self-mutilation: Past  Violence: None  Alcohol Use: Current  Drug Use: None    Goals for Group Treatment:  Goal #1: Learn skills to manage affect in the moment  Goal #2:     Adult Group Therapy Offerings (Please check preferential group and time):    []  Depression Management (Mon. 4:00-5:30)  []  Anxiety Management (Tues. 12:00-1:30)  []  Present Centered Group Therapy of Survivors of Trauma (Thurs. 4:00-5:30)   [x]  Focused Brief Group (Thurs. 12:00-1:30)  []  Transition Group (Thurs. 9:00-10:30)  []  Distress Tolerance Skills Training:   []  Mon. 5:30-7:00 []  Tues. 4:30-6:00 []  Tues. 6:30-8:00 []  Wed. 1:00-2:30 []  Wed.5:30-7:00  []  Thurs.11:30-1:00     []  Interpersonal Skills Group:   []  Mon. 5:30-7:00 []  Tues. 4:30-6:00 []  Tues. 6:30-8:00  []  Wed. 1:00-2:30   []  Wed.5:30-7:00 []  Thurs. 11:30-1:00    []  Emotional Regulation Skills Training:   []  Mon. 5:30-7:00 []  Tues. 4:30-6:00 []  Tues. 6:30-8:00  []  Wed. 1:00-2:30    []  Wed.5:30-7:00 []  Thurs. 11:30-1:00    []  Other    Comments:  Is the patient receptive to this referral?  Yes  Will you be involved in the patient's continued care? Yes  Does patient have an individual therapist? Yes    Name of Therapist: Tresa Res    Is he/she in support of this referral? Yes      Referral Processing Information:  The above information must be completed in full for referral to be processed.  Please send completed form to Luther Bradley, Psychiatry via [facility].

## 2015-08-31 ENCOUNTER — Other Ambulatory Visit: Payer: Self-pay | Admitting: Primary Care

## 2015-08-31 ENCOUNTER — Encounter: Payer: Self-pay | Admitting: Gastroenterology

## 2015-08-31 DIAGNOSIS — IMO0002 Reserved for concepts with insufficient information to code with codable children: Secondary | ICD-10-CM

## 2015-09-04 ENCOUNTER — Other Ambulatory Visit: Payer: Self-pay | Admitting: Psychiatry

## 2015-09-04 MED ORDER — BUSPIRONE HCL 30 MG PO TABS *I*
30.0000 mg | ORAL_TABLET | Freq: Two times a day (BID) | ORAL | 2 refills | Status: DC
Start: 2015-09-04 — End: 2015-10-25

## 2015-09-04 MED ORDER — LAMOTRIGINE 100 MG PO TABS *I*
100.0000 mg | ORAL_TABLET | Freq: Every day | ORAL | 0 refills | Status: DC
Start: 2015-09-04 — End: 2015-09-13

## 2015-09-05 ENCOUNTER — Encounter: Payer: Self-pay | Admitting: Registered Nurse

## 2015-09-06 ENCOUNTER — Other Ambulatory Visit: Payer: Self-pay | Admitting: Gastroenterology

## 2015-09-07 ENCOUNTER — Telehealth: Payer: Self-pay | Admitting: Primary Care

## 2015-09-07 NOTE — Telephone Encounter (Addendum)
Please call pt and let her know we have her trulicity from pharmacy program. She needs to pick it up asap as it is a refrigerated product. Medication given to Adan Sis RN. Please let her know she should pick it up by tomorrow at [place], [facility]  [facility]  09/07/2015  11:05 AM

## 2015-09-07 NOTE — Telephone Encounter (Signed)
Patient came into office and picked up medication

## 2015-09-07 NOTE — Telephone Encounter (Signed)
Pt will be in office today to pick medication up.  Medication is in a brown box in medication refrigerator

## 2015-09-11 ENCOUNTER — Ambulatory Visit: Payer: Self-pay | Admitting: Primary Care

## 2015-09-11 ENCOUNTER — Encounter: Payer: Self-pay | Admitting: Primary Care

## 2015-09-11 VITALS — BP 124/82 | HR 80 | Temp 97.7°F | Ht 67.32 in | Wt 295.8 lb

## 2015-09-11 DIAGNOSIS — R55 Syncope and collapse: Secondary | ICD-10-CM

## 2015-09-11 DIAGNOSIS — N907 Vulvar cyst: Secondary | ICD-10-CM

## 2015-09-11 DIAGNOSIS — IMO0002 Reserved for concepts with insufficient information to code with codable children: Secondary | ICD-10-CM

## 2015-09-11 DIAGNOSIS — M797 Fibromyalgia: Secondary | ICD-10-CM

## 2015-09-11 NOTE — Progress Notes (Signed)
[facility]    SUBJECTIVE    Pt is here to discuss:    Chief Complaint   Patient presents with    Diabetes    Due for mammo, microal       1. Fu DM - Adherent with medications, loving the VGO! Does check BGs, fasting 112-120s, highest one was today at 167. Had one low of 72 last night (only episode in months) and therefore held her evening humalog dose. Denies polyuria, polydipsia or hypoglycemia sx. Denies peripheral paresthesias. Does check feet, denies foot lesions or concerns.  Working with [employer] pt program for her trulicity and humalog; rec'd the trulicity but the pharmacy stated the other forms for her humalog and cymbalta were lost. Asking me to complete paperwork today. Will be out of humalog by end of this week, unable ot afford it herself.  Lab Results   Component Value Date    HA1C 9.0 (H) 07/10/2015    HA1C 9.3 (H) 06/05/2015    HA1C 7.7 (H) 11/08/2014    MALBR <0.30 09/15/2014    CREAT 0.74 06/05/2015    LDLC 73 06/05/2015         2. Labial cyst - inflammation/infection has resolved but has persistent pea sized lump there. Requesting removal given the severity of the flare last month.     3. Recurrent syncope - remarkable reduced in frequency! Seen by cardiology last month and per note had not had any events in 14mo [facility]. Imaging unremarkable. Overall pt is happy with reduced frequency.    PMH / Family Hx / Social Hx  Patient's medications, allergies, problem list, past medical, social histories were reviewed and notable for:    Current Outpatient Prescriptions   Medication Sig Note    busPIRone (BUSPAR) 30 MG tablet Take 1 tablet (30 mg total) by mouth 2 times daily     lamoTRIgine (LAMICTAL) 100 MG tablet Take 1 tablet (100 mg total) by mouth daily     TRULICITY 02.53MGU/4.4IHSOPN INJECT 0.75 MG INTO THE SKIN ONCE A WEEK     insulin lispro (HUMALOG) 100 UNIT/ML injection vial For VGO: Dinner 147Q(6 clicks) ; B & L 4- 6 units.  BG 100 - 150 8u (4clk), 151 - 200 10U (5clk), >201 12U ( 6 ck). MDD 72U (36 clk)     ARIPiprazole (ABILIFY) 15 MG tablet Take 1/2 tablet daily in the morning.     mirtazapine (REMERON) 7.5 MG tablet Take 1 tablet (7.5 mg total) by mouth nightly     Insulin Disposable Pump (V-GO 30) KIT Use daily; 30ct/month.     promethazine (PHENERGAN) 12.5 MG tablet Swish and swallow 12.5 mg 06/12/2015: Received from: [facility] Take 1 tablet by mouth every 8 (eight) hours as needed for Nausea / Vomiting.    atorvastatin (LIPITOR) 40 MG tablet TAKE 1 TABLET (40 MG TOTAL) BY MOUTH DAILY (WITH DINNER)     Non-System Medication Helmet to prevent head injuries;   multiple syncopal episodes and hitting head repeatedly.     DULoxetine (CYMBALTA) 60 MG capsule Take 1 capsule (60 mg total) by mouth daily     pantoprazole (PROTONIX) 40 MG EC tablet TAKE 1 TABLET (40 MG TOTAL) BY MOUTH DAILY SWALLOW WHOLE. DO NOT CRUSH, BREAK, OR CHEW.     fluticasone (FLONASE) 50 MCG/ACT nasal spray 1 spray by Nasal route daily     dicyclomine (BENTYL) 20  MG tablet TAKE 1 TABLET (20 MG TOTAL) BY MOUTH 4 TIMES DAILY (BEFORE MEALS AND NIGHTLY)     midodrine (PROAMATINE) 10 MG tablet Take 10 mg by mouth 3 times daily 02/27/2015: Received from: External Pharmacy    VENTOLIN HFA 108 (90 BASE) MCG/ACT inhaler Inhale 1-2 puffs into the lungs every 4-6 hours as needed for Wheezing     clonazePAM (KLONOPIN) 0.5 MG tablet Take 1 tablet (0.5 mg total) by mouth 2 times daily as needed   Max daily dose: 1 mg     FREESTYLE LITE test strip Use four times daily as directed for 250.02     blood glucose monitor system Brand: cheapest brand available per her insurance.  Use as directed.     lancets Brand Free Style Lite; Use 2 times per day as directed for blood glucose testing.     SUMAtriptan refill (IMITREX STATDOSE) 6 MG/0.5ML injection Inject 6 mg into the skin once as needed for Migraine   May repeat once after 1 hour if  needed.     atenolol (TENORMIN) 25 MG tablet Take 25 mg by mouth daily     Alcohol Swabs (ALCOHOL WIPES) PADS Use BID for BG check     famciclovir (FAMVIR) 500 MG tablet Take 1 tablet (500 mg total) by mouth 3 times daily as needed     topiramate (TOPAMAX) 100 MG tablet Take 100 mg by mouth 2 times daily     cetirizine (ZYRTEC) 10 MG tablet Take 10 mg by mouth daily     levonorgestrel (MIRENA) 20 MCG/24HR IUD 1 each by Intrauterine route once     SUMAtriptan (IMITREX) 50 MG tablet Take 1 tablet (50 mg total) by mouth as needed for Migraine   Take at onset of headache. May repeat once in 2 hours.     Non-System Medication The above patient is followed in our clinic and cannot resume work permanently.        OBJECTIVE  Vitals:    09/11/15 1549   BP: 124/82   BP Location: Left arm   Patient Position: Sitting   Cuff Size: large adult   Pulse: 80   Temp: 36.5 C (97.7 F)   TempSrc: Temporal   Weight: 134.2 kg (295 lb 12.8 oz)   Height: 1.71 m (5' 7.32")     Body mass index is 45.89 kg/(m^2).      General: well-appearing obese Caucasian female, pleasant & conversant, in NAD. VGO on R arm  GU: pea sized nodule at L labia majora with slight yellow nodular head that can be seen at skin. No surrounding inflammation, erythema or fluctuance.   Psych: AAOx3, normal affect and mood. Insight and judgement intact.           ASSESSMENT & PLAN  1. DM (diabetes mellitus), type 2, uncontrolled  Encouraged to have A1c checked again, suspect this will show remarkable improvement with the VGO which has only been on board since March. Reviewed that 1 single episode of hypoglycemia can be tolerated, but if recurrent episodes, we may want to back down on her insulin dosing. Elevated fasting sugar today likely a result of the skipped insulin last night, which was appropriate yesterday.     form completed again for Humalog that patient is receiving through a patient assistance program.  Will ask care manager to follow-up on this at  the end of the week, we may an emergency supply of Humalog.  - Microalbumin, Urine, Random; Future  - Hemoglobin  A1c; Future  - Microalbumin, Urine, Random    2. Labial cyst  Persistent cyst, at risk of recurrent inflammation.  Follow-up next availability for excisional removal.     3. 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 Non-arrythmic per Dr Lovena Le 10/2014  Infrequent episodes, improving in frequency. Continue care per cardiology.     4. Fibromyalgia  Form completed for cymbalta as well        Follow-up: [place]:DM, next avail for labial cyst removal      JJohny Drilling MD  [place] 09/11/2015  4:06 PM        ______________________

## 2015-09-13 ENCOUNTER — Other Ambulatory Visit: Payer: Self-pay

## 2015-09-13 ENCOUNTER — Encounter: Payer: Self-pay | Admitting: Psychiatry

## 2015-09-13 ENCOUNTER — Telehealth: Payer: Self-pay

## 2015-09-13 ENCOUNTER — Ambulatory Visit: Payer: Self-pay | Admitting: Psychiatry

## 2015-09-13 VITALS — BP 122/67 | HR 69 | Resp 18 | Ht 67.32 in | Wt 296.0 lb

## 2015-09-13 DIAGNOSIS — E119 Type 2 diabetes mellitus without complications: Secondary | ICD-10-CM

## 2015-09-13 DIAGNOSIS — F331 Major depressive disorder, recurrent, moderate: Secondary | ICD-10-CM

## 2015-09-13 DIAGNOSIS — F422 Mixed obsessional thoughts and acts: Secondary | ICD-10-CM

## 2015-09-13 DIAGNOSIS — F603 Borderline personality disorder: Secondary | ICD-10-CM

## 2015-09-13 LAB — MICROALBUMIN, URINE, RANDOM
Creatinine,UR: 73 mg/dL (ref 20–300)
Microalb/Creat Ratio: 4.2 mg MA/g CR (ref 0.0–29.9)
Microalbumin,UR: 0.31 mg/dL

## 2015-09-13 MED ORDER — INSULIN LISPRO (HUMAN) 100 UNIT/ML IJ/SC SOLN *WRAPPED*
SUBCUTANEOUS | 0 refills | Status: DC
Start: 2015-09-13 — End: 2015-09-13

## 2015-09-13 MED ORDER — INSULIN LISPRO (HUMAN) 100 UNIT/ML IJ/SC SOLN *WRAPPED*
SUBCUTANEOUS | 0 refills | Status: DC
Start: 2015-09-13 — End: 2016-09-21

## 2015-09-13 MED ORDER — LAMOTRIGINE 150 MG PO TABS *I'
150.0000 mg | ORAL_TABLET | Freq: Every day | ORAL | 1 refills | Status: DC
Start: 2015-09-13 — End: 2015-10-11

## 2015-09-13 NOTE — Progress Notes (Signed)
[facility] SOCIAL WORK  PHARMACY FORM     Todays date:  Sep 13, 2015    Patient Name: [place] Record #: [id number]   DOB: 1975-07-08  Patients Address: [place]                Social Worker: Mathews Argyle, LMSW       Date of Service: Sep 13, 2015       Funding Source: [place]  ___________________________________________________________________    Pharmacy Information: For Humalog [phone])  Date/time sent: Sep 13, 2015     Time needed: As soon as able    Patient Location: Outpatient    Medication Pick-up Preference: Patient will pick up at the pharmacy    Pharmacy Contact:  Deputy work signature: Mathews Argyle, LMSW  Supervisor/Manager Approval (if indicated): Rudean Hitt, LCSW verbal approval   Date:  (supervisor signature not required for Medicaid pending)

## 2015-09-13 NOTE — Progress Notes (Signed)
rx sent    Thanks Butch Penny!

## 2015-09-13 NOTE — Progress Notes (Signed)
Call back received from Catherine, she states that she called [facility] per our conversation and the cost for the 3 vials is over $300, and for 1 vial it is over $100. She states that she just can't afford that.   Call placed to Kerri Perches SW at [facility], she states that she might be able to get 1 vial covered, request that PCP write script for 1 vial to go to the [facility].  Will initiate script and send to PCP to sign

## 2015-09-13 NOTE — Progress Notes (Signed)
Behavioral Health Psychopharmacology Follow-up     Length of Session: 20 minutes.    Diagnosis Addressed    ICD-10-CM ICD-9-CM   1. 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 Mixed obsessional thoughts and acts F42.2 300.3   3. Borderline personality disorder F60.3 301.83       Recent History and Response to Medications  Patient states:   HPI     Follow-up    Additional comments: patient is here to follow up on her medications       Last edited by Shirlee More, LPN on 06/07/5425  0:62 PM. (History)          Current use of alcohol or drugs: No        Neurovegetative Symptoms Review:  Energy level: fair  Concentration: fair  Sleep Quality: fair   Number of hours :  (5-6)  Appetite: fair     Wt Readings from Last 3 Encounters:   09/13/15 134.3 kg (296 lb)   09/11/15 134.2 kg (295 lb 12.8 oz)   08/11/15 133.2 kg (293 lb 9.6 oz)     Enjoyment/interest: fair        Current Medications  Current Outpatient Prescriptions   Medication Sig    insulin lispro (HUMALOG) 100 UNIT/ML injection vial For VGO: Dinner 37S (6 clicks) ; B & L 4- 6 units. BG 100 - 150 8u (4clk), 151 - 200 10U (5clk), >201 12U ( 6 ck). MDD 72U (36 clk)    busPIRone (BUSPAR) 30 MG tablet Take 1 tablet (30 mg total) by mouth 2 times daily    lamoTRIgine (LAMICTAL) 100 MG tablet Take 1 tablet (100 mg total) by mouth daily    TRULICITY 2.83 TD/1.7OH SOPN INJECT 0.75 MG INTO THE SKIN ONCE A WEEK    ARIPiprazole (ABILIFY) 15 MG tablet Take 1/2 tablet daily in the morning.    mirtazapine (REMERON) 7.5 MG tablet Take 1 tablet (7.5 mg total) by mouth nightly    Insulin Disposable Pump (V-GO 30) KIT Use daily; 30ct/month.    promethazine (PHENERGAN) 12.5 MG tablet Swish and swallow 12.5 mg    atorvastatin (LIPITOR) 40 MG tablet TAKE 1 TABLET (40 MG TOTAL) BY MOUTH DAILY (WITH DINNER)    DULoxetine (CYMBALTA) 60 MG capsule Take 1 capsule (60 mg total) by mouth daily    pantoprazole (PROTONIX) 40 MG EC tablet TAKE 1 TABLET (40 MG TOTAL) BY  MOUTH DAILY SWALLOW WHOLE. DO NOT CRUSH, BREAK, OR CHEW.    fluticasone (FLONASE) 50 MCG/ACT nasal spray 1 spray by Nasal route daily    dicyclomine (BENTYL) 20 MG tablet TAKE 1 TABLET (20 MG TOTAL) BY MOUTH 4 TIMES DAILY (BEFORE MEALS AND NIGHTLY)    midodrine (PROAMATINE) 10 MG tablet Take 10 mg by mouth 3 times daily    FREESTYLE LITE test strip Use four times daily as directed for 250.02    blood glucose monitor system Brand: cheapest brand available per her insurance.  Use as directed.    atenolol (TENORMIN) 25 MG tablet Take 25 mg by mouth daily    Alcohol Swabs (ALCOHOL WIPES) PADS Use BID for BG check    topiramate (TOPAMAX) 100 MG tablet Take 100 mg by mouth 2 times daily    cetirizine (ZYRTEC) 10 MG tablet Take 10 mg by mouth daily    levonorgestrel (MIRENA) 20 MCG/24HR IUD 1 each by Intrauterine route once    Non-System Medication The above patient is followed in our clinic and cannot resume  work permanently.    Non-System Medication Helmet to prevent head injuries;   multiple syncopal episodes and hitting head repeatedly.    VENTOLIN HFA 108 (90 BASE) MCG/ACT inhaler Inhale 1-2 puffs into the lungs every 4-6 hours as needed for Wheezing    clonazePAM (KLONOPIN) 0.5 MG tablet Take 1 tablet (0.5 mg total) by mouth 2 times daily as needed   Max daily dose: 1 mg    lancets Brand Free Style Lite; Use 2 times per day as directed for blood glucose testing.    SUMAtriptan refill (IMITREX STATDOSE) 6 MG/0.5ML injection Inject 6 mg into the skin once as needed for Migraine   May repeat once after 1 hour if needed.    famciclovir (FAMVIR) 500 MG tablet Take 1 tablet (500 mg total) by mouth 3 times daily as needed    SUMAtriptan (IMITREX) 50 MG tablet Take 1 tablet (50 mg total) by mouth as needed for Migraine   Take at onset of headache. May repeat once in 2 hours.     No current facility-administered medications for this visit.        Side Effects  Patient Reported Side Effects: None  reported    Mental Status  APPEARANCE: Casual  ATTITUDE TOWARD INTERVIEWER: Cooperative  MOTOR ACTIVITY: WNL (within normal limits)  EYE CONTACT: Direct  SPEECH: Normal rate and tone  AFFECT: Full Range and Appropriate  MOOD: Anxious and Depressed  THOUGHT PROCESS: Normal  THOUGHT CONTENT: No unusual themes  PERCEPTION: Hallucinations Auditory where she hears old music.  ORIENTATION: Alert and Oriented X 3.  CONCENTRATION: Good  MEMORY:   Recent: intact   Remote: intact  COGNITIVE FUNCTION: Average intelligence  JUDGMENT: Intact  IMPULSE CONTROL: Fair and Poor  INSIGHT: Fair    Risk Assessment    Self Injury: Patient Denies  Suicidal Ideation: Patient Denies  Homicidal Ideation: Patient Denies  Aggressive Behavior: Patient Denies    Results  none    BP Readings from Last 3 Encounters:   09/13/15 122/67   09/11/15 124/82   08/11/15 122/84       Assessment  FORMULATION: Patient reports she is feeling better and has had a decrease on both the depression and anxiety assessments.  She denies any side effects from the Lamictal.  Her only complaint today is that she is crying easily "over stupid stuff."  She got angry because her wife did not throw something out before she took the garbage out this morning.  "I started to cry about it.  We discussed increasing the Lamictal to see if it will target the emotional sensitivity to which she agreed.  We discussed that if she continues to respond well to the Lamictal, writer will see if we can taper down the Abilify since it can impact on her diabetes to which she agreed.   This causes problems between she and her wife.  She is losing weight as she has less of an appetite since she was put on the Trulicity.      Recommendations/Plan and Rationale  PLAN: Increase Lamictal to 150 mg daily.  Continue other medications. Return in 4 weeks.

## 2015-09-13 NOTE — Telephone Encounter (Signed)
Received approval from [place] for patient Humalog vial in the [facility].    [facility] contacted to notify of approval and that SW voucher will be completed.    Rayburn Ma, RN Care Manager notified of above as well via Voicemail message.    Mathews Argyle, LMSW

## 2015-09-13 NOTE — Telephone Encounter (Signed)
Call from [place], RN. Patient with need for assistance with Humalog- 1 vial. She has applied to [employer] P[employer] and was approved. The medications will not arrive to office for 10-14 days. Patient will run out of her Humalog by Friday and cannot afford the cost of the medication.    Script sent to OP Pharmacy at [facility]. Spoke to [place] and cost for 1 vial is $107.     Message left for [facility], LCSW to obtain approval for patient. Await call back with approval and will then follow up with Care Manager.    Mathews Argyle, LMSW  [facility] Social Work  [phone]

## 2015-09-13 NOTE — Progress Notes (Signed)
Call received from [place], they were able to cover one vial of Humalog for the pt.     Call placed to [facility] to make her aware that we were able to get one vial of Humalog covered for her, she will just need to go to the [facility] outpatient pharmacy and to bring her ID. She states that she is actually out in [place] so that works for her.   She is appreciative of the assistance.

## 2015-09-13 NOTE — Progress Notes (Signed)
Pt has been accepted by [facility] to receive Humalog, Trulicty and Cymbalta, called placed to [facility] to see when shipment should arrive, they state within next 10-14 business days.   Call then placed to pt's pharmacy to see the cost of Humalog as pt is almost out. Spoke with Pharmacist; they state there is no cost to the Humalog.   Will call pt to make her aware, and forward to PCP to order for her.      Call placed to pt, explained the shipment will be in about 10-14 days, but that meanwhile I called [facility] and the pharmacist states the Humalog is covered. I made her aware that we will have Dr. Laurance Flatten order it, but I have asked Annaliah to call there first to see what the copay is as she stated that last time it was a $300 copay, reiterated the [place] states there is no copay. If there is a copay, I have requested that Darce call me back, I provided her my direct number and if she has a copay we will seek another avenue, which more than likely will be [facility] SW Kerri Perches for a one time assistance.  Awaiting on call back from [place]

## 2015-09-14 ENCOUNTER — Telehealth: Payer: Self-pay

## 2015-09-14 NOTE — Telephone Encounter (Signed)
Pt advised that pt has medication in or office for her to pick up.

## 2015-09-15 NOTE — Telephone Encounter (Signed)
Patient came into office and picked up medication

## 2015-09-19 ENCOUNTER — Encounter: Payer: Self-pay | Admitting: Endocrinology

## 2015-09-19 NOTE — Progress Notes (Signed)
Continuous Glucose Monitoring Report: Sensor Type    Indication: uncontrolled type 2 DM    Date of CGM hook up:08/09/2015    # Calibrations: 15    Average sensor values:1080    Average glucose:133    Hypoglycemia events and excursions: none    Hyperglycemic events and excursions:16%    Current diabetes regimen:  -Trulicity [id number] mg q week;Humalog vial in VGO insulin delivery system (1 click = 2 units); recall B & L 4 clicks; dinner 6 clicks    Changes to diabetes regimen after CGM:  -overall, 86% of sugars are in target range and the basal level of insulin that she is getting is perfect.  -suggestions could be for higher carb meals such as her bagel w/ cream cheese and coffee w/ cream and sugar she could use 5 clicks on the vgo (10 units) instead of 4.    Tonette Bihari, MD  Department of Endocrinology  [address] [place], [place] [address]  Phone: [phone]  Fax: [phone]  Pager: [phone]

## 2015-09-20 ENCOUNTER — Encounter: Payer: Self-pay | Admitting: Psychiatry

## 2015-09-20 ENCOUNTER — Other Ambulatory Visit: Payer: Self-pay | Admitting: Psychiatry

## 2015-09-20 ENCOUNTER — Ambulatory Visit: Payer: Self-pay

## 2015-09-20 DIAGNOSIS — F4310 Post-traumatic stress disorder, unspecified: Secondary | ICD-10-CM

## 2015-09-20 DIAGNOSIS — F603 Borderline personality disorder: Secondary | ICD-10-CM

## 2015-09-20 DIAGNOSIS — F3341 Major depressive disorder, recurrent, in partial remission: Secondary | ICD-10-CM

## 2015-09-20 DIAGNOSIS — F429 Obsessive-compulsive disorder, unspecified: Secondary | ICD-10-CM

## 2015-09-20 MED ORDER — MIRTAZAPINE 7.5 MG PO TABS *A*
7.5000 mg | ORAL_TABLET | Freq: Every evening | ORAL | 2 refills | Status: DC
Start: 2015-09-20 — End: 2015-11-08
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
## 2015-09-20 NOTE — BH Treatment Plan (Signed)
[facility] Treatment [facility]     Date of Plan:   R PSY TP DATE FROM 09/20/2015   FROM 09/26/2015      Created/Updated On 09/20/2015   Next Treatment Plan Due 12/26/2015       Diagnostic Impression    ICD-10-CM ICD-9-CM   1. 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 PTSD (post-traumatic stress disorder) F43.10 309.81   3. OCD (obsessive compulsive disorder) F42.9 300.3   4. Borderline personality disorder F60.3 301.83       Strengths  Strengths derived from the assessment include: Patient is hard working, has supports.    Problem Areas  *At least one problem must be targeted toward risk reduction if Formulation of Risk or any other previous exam indicated special monitoring or intervention for suicide and/or violence risk indicated.    PROBLEM AREAS (choose and describe relevant):  THOUGHT: Negative  MOOD:Depressed and anxious  BEHAVIOR: Reactive and avoidant  ECONOMIC:SSDI  ________________________________________________________________  R PSY TP PROBLEM 09/20/2015   1ST TREATMENT PLAN PROBLEM Depression and anxiety        R PSY TP GOAL 09/20/2015   1ST TREATMENT PLAN GOAL Learn DBT and PST skills to manage affect       The rationale for addressing this problem is that resolving it will (select all that apply):  Reduce symptoms of disorder, Reduce functional impairment associated with disorder, Decrease likelihood of hospitalization, Facilitate transfer skills learned in therapy to everday life, Is a key motivational factor for the patient's participation in treatment, Reduce risk for suicide* and Reduce risk for violence*      Progress toward goal(s): Problem resolving. Comment: Patient's mood has remained stable. Patient declined any further engagement in skills group. Patient will transition out of treatment in a few months.    1 a. Measurable Objectives : Patient will make and keep regular individual therapy appointments.   Date established: 05/31/14   Target date:  12/26/15   Attained or Revised? continue    1 b. Measurable Objectives : Patient will continue to learn and utilize skills to manage affect.   Date established: 05/31/14   Target date: 12/26/15   Attained or Revised? continue    ______________________________________________________________________      Plan  TREATMENT MODALITIES:  Individual psychotherapy for 30-60 min Q 1-3 weeks with Tresa Res, LCSW.  Psychopharmacology visits 20-45 min Q 2-8 weeks with [name], NP.     DISCHARGE CRITERIA for this treatment setting: Patient reports a reduction in symptoms as evidenced by PHQ-9 and GAD-7 scores.    Clinician's name: Tresa Res, LCSW    Psychiatrist's Name: Donny Pique, MD      Patient/Family Statement  PATIENT/FAMILY STATEMENT:  Obtain patient and family input into the treatment plan, including areas of agreement / disagreement.  Obtain patient's signature - if not possible, briefly describe the reason.     Patient Comments:          I HAVE PARTICIPATED IN THE DEVELOPMENT OF THIS TREATMENT PLAN AND I AGREE WITH ITS CONTENTS:       Patient Signature: _______________________________________________________    Date: ______________________________

## 2015-09-21 ENCOUNTER — Other Ambulatory Visit: Payer: Self-pay

## 2015-09-21 NOTE — Progress Notes (Signed)
Call placed to pt to make her aware that Cymbalta has arrived at the office for her. She will try to make it today and if not, states will be by tomorrow. Med brought up to front staff with pt name on package and made them aware she may come in today and if not Juliann Pulse states they can lock it up in the front for tomorrow

## 2015-09-22 ENCOUNTER — Other Ambulatory Visit
Admission: RE | Admit: 2015-09-22 | Discharge: 2015-09-22 | Disposition: A | Discharge status: Home / Self Care | Payer: Self-pay | Source: Ambulatory Visit | Attending: Primary Care | Admitting: Primary Care

## 2015-09-22 DIAGNOSIS — IMO0002 Reserved for concepts with insufficient information to code with codable children: Secondary | ICD-10-CM

## 2015-09-23 LAB — HEMOGLOBIN A1C: Hemoglobin A1C: 7.4 % — ABNORMAL HIGH (ref 4.0–6.0)

## 2015-09-27 ENCOUNTER — Ambulatory Visit: Payer: Medicare (Managed Care) | Attending: Primary Care | Admitting: Nutrition

## 2015-09-27 VITALS — Wt 295.0 lb

## 2015-09-27 DIAGNOSIS — E1165 Type 2 diabetes mellitus with hyperglycemia: Secondary | ICD-10-CM | POA: Insufficient documentation

## 2015-09-27 DIAGNOSIS — IMO0001 Reserved for inherently not codable concepts without codable children: Secondary | ICD-10-CM

## 2015-09-27 DIAGNOSIS — Z794 Long term (current) use of insulin: Secondary | ICD-10-CM | POA: Insufficient documentation

## 2015-09-27 NOTE — Patient Instructions (Signed)
Aim for 15g carb for snack at night     No Sugar added frozen desert items,   Sugar free cookies Great Value, Voortmans, Murrays     100 cal snack packs regular items     Salted nuts for salt better than pretzels

## 2015-09-27 NOTE — Progress Notes (Signed)
Kaylee Harris is here today for [facility] regarding Insulin requiring Type 2 Diabetes. Referred by: PCP  Here with wife, Shelbie Ammons VGO insulin delivery  On Trulicity meds covered by [facility] Readings from Last 3 Encounters:   09/27/15 133.8 kg (295 lb)   09/11/15 134.2 kg (295 lb 12.8 oz)   08/11/15 133.2 kg (293 lb 9.6 oz)     Lab Results   Component Value Date    HA1C 7.4 (H) 09/22/2015    HA1C 9.0 (H) 07/10/2015    HA1C 9.3 (H) 06/05/2015    MALBR 0.31 09/11/2015    CREAT 0.74 06/05/2015    LDLC 73 06/05/2015       Diet: grazing on snacks not going to program anymore; 3 meals close together 11 am 2 pm and 5 pm  Beverages: Powerade Zero; water, coffee, creamer   ETOH: no  Exercise : sedentary  ; dizzy spells  DM Meds: VGOTaking as prescribed.       Meter:  Freestyle lite 3 time daily,  Ac meals    Assessment:   DM- A1c much lower with new tx plan; reviewed CGM report- highest with plain bagel from [place]- start after instruction from RN CDE 08/09/15    Obesity  - stable but wants to lose; reviewed snack portions today  Meals- are OK but eating too many snacks; reviewed healthier choices;  Exercise very limited    Coun/Edu:     Barriers to learning:  Neuro problems  Education Provided: Plate Method label reading and carb factors    Plan:   Nutrition Goals:   1500 calories  150-180 gm carbs  Weight goal: (-10%) 270 lb  Patient Instructions   Aim for 15g carb for snack at night     No Sugar added frozen desert items,   Sugar free cookies Great Value, Voortmans, Murrays     100 cal snack packs regular items     Salted nuts for salt better than pretzels         Follow-up visit:  3 mo with RN CDE  Time spent counseling patient: 35 minutes    [employer] insurance MD follow up referral is in eRecord  .  MNT 1 hrs remain   Expires 05/05/16    Marjo Bicker, RD

## 2015-10-11 ENCOUNTER — Encounter: Payer: Self-pay | Admitting: Psychiatry

## 2015-10-11 ENCOUNTER — Ambulatory Visit: Payer: Self-pay | Admitting: Psychiatry

## 2015-10-11 ENCOUNTER — Other Ambulatory Visit: Payer: Self-pay | Admitting: Primary Care

## 2015-10-11 VITALS — BP 128/70 | HR 62 | Resp 18 | Ht 67.32 in | Wt 294.0 lb

## 2015-10-11 DIAGNOSIS — F603 Borderline personality disorder: Secondary | ICD-10-CM

## 2015-10-11 DIAGNOSIS — IMO0002 Reserved for concepts with insufficient information to code with codable children: Secondary | ICD-10-CM

## 2015-10-11 DIAGNOSIS — F422 Mixed obsessional thoughts and acts: Secondary | ICD-10-CM

## 2015-10-11 DIAGNOSIS — F331 Major depressive disorder, recurrent, moderate: Secondary | ICD-10-CM

## 2015-10-11 MED ORDER — LAMOTRIGINE 100 MG PO TABS *I*
100.0000 mg | ORAL_TABLET | Freq: Every day | ORAL | 2 refills | Status: DC
Start: 2015-10-11 — End: 2015-11-08

## 2015-10-11 MED ORDER — V-GO 30 KIT
PACK | 11 refills | Status: DC
Start: 2015-10-11 — End: 2016-08-05

## 2015-10-11 NOTE — Progress Notes (Signed)
Behavioral Health Psychopharmacology Follow-up     Length of Session: 20 minutes.    Diagnosis Addressed    ICD-10-CM ICD-9-CM   1. 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 Mixed obsessional thoughts and acts F42.2 300.3   3. Borderline personality disorder F60.3 301.83       Recent History and Response to Medications  Patient states:   HPI     Follow-up    Additional comments: patient is here to follow up on her medications       Last edited by Shirlee More, LPN on 12/10/5782  6:96 PM. (History)          Current use of alcohol or drugs: No        Neurovegetative Symptoms Review:  Energy level: fair  Concentration: fair  Sleep Quality: fair   Number of hours :  (5)  Appetite: fair     Wt Readings from Last 3 Encounters:   10/11/15 133.4 kg (294 lb)   09/27/15 133.8 kg (295 lb)   09/13/15 134.3 kg (296 lb)     Enjoyment/interest: fair        Current Medications  Current Outpatient Prescriptions   Medication Sig    Insulin Disposable Pump (V-GO 30) KIT Use daily; 30ct/month.    mirtazapine (REMERON) 7.5 MG tablet Take 1 tablet (7.5 mg total) by mouth nightly    insulin lispro (HUMALOG) 100 UNIT/ML injection vial For VGO: Dinner 29B (6 clicks) ; B & L 4- 6 units. BG 100 - 150 8u (4clk), 151 - 200 10U (5clk), >201 12U ( 6 ck). MDD 72U (36 clk)    lamoTRIgine (LAMICTAL) 150 MG tablet Take 1 tablet (150 mg total) by mouth daily    busPIRone (BUSPAR) 30 MG tablet Take 1 tablet (30 mg total) by mouth 2 times daily    TRULICITY 2.84 XL/2.4MW SOPN INJECT 0.75 MG INTO THE SKIN ONCE A WEEK    ARIPiprazole (ABILIFY) 15 MG tablet Take 1/2 tablet daily in the morning.    promethazine (PHENERGAN) 12.5 MG tablet Swish and swallow 12.5 mg    atorvastatin (LIPITOR) 40 MG tablet TAKE 1 TABLET (40 MG TOTAL) BY MOUTH DAILY (WITH DINNER)    DULoxetine (CYMBALTA) 60 MG capsule Take 1 capsule (60 mg total) by mouth daily    pantoprazole (PROTONIX) 40 MG EC tablet TAKE 1 TABLET (40 MG TOTAL) BY MOUTH DAILY  SWALLOW WHOLE. DO NOT CRUSH, BREAK, OR CHEW.    fluticasone (FLONASE) 50 MCG/ACT nasal spray 1 spray by Nasal route daily    dicyclomine (BENTYL) 20 MG tablet TAKE 1 TABLET (20 MG TOTAL) BY MOUTH 4 TIMES DAILY (BEFORE MEALS AND NIGHTLY)    midodrine (PROAMATINE) 10 MG tablet Take 10 mg by mouth 3 times daily    FREESTYLE LITE test strip Use four times daily as directed for 250.02    blood glucose monitor system Brand: cheapest brand available per her insurance.  Use as directed.    lancets Brand Free Style Lite; Use 2 times per day as directed for blood glucose testing.    SUMAtriptan refill (IMITREX STATDOSE) 6 MG/0.5ML injection Inject 6 mg into the skin once as needed for Migraine   May repeat once after 1 hour if needed.    atenolol (TENORMIN) 25 MG tablet Take 25 mg by mouth daily    Alcohol Swabs (ALCOHOL WIPES) PADS Use BID for BG check    topiramate (TOPAMAX) 100 MG tablet Take 100 mg by mouth 2  times daily    cetirizine (ZYRTEC) 10 MG tablet Take 10 mg by mouth daily    levonorgestrel (MIRENA) 20 MCG/24HR IUD 1 each by Intrauterine route once    Non-System Medication The above patient is followed in our clinic and cannot resume work permanently.    Non-System Medication Helmet to prevent head injuries;   multiple syncopal episodes and hitting head repeatedly.    VENTOLIN HFA 108 (90 BASE) MCG/ACT inhaler Inhale 1-2 puffs into the lungs every 4-6 hours as needed for Wheezing    clonazePAM (KLONOPIN) 0.5 MG tablet Take 1 tablet (0.5 mg total) by mouth 2 times daily as needed   Max daily dose: 1 mg    famciclovir (FAMVIR) 500 MG tablet Take 1 tablet (500 mg total) by mouth 3 times daily as needed    SUMAtriptan (IMITREX) 50 MG tablet Take 1 tablet (50 mg total) by mouth as needed for Migraine   Take at onset of headache. May repeat once in 2 hours.     No current facility-administered medications for this visit.        Side Effects  Patient Reported Side Effects: None reported    Mental  Status  APPEARANCE: Casual  ATTITUDE TOWARD INTERVIEWER: Cooperative  MOTOR ACTIVITY: WNL (within normal limits)  EYE CONTACT: Direct  SPEECH: Normal rate and tone  AFFECT: Full Range and Appropriate  MOOD: Euthymic  THOUGHT PROCESS: Normal  THOUGHT CONTENT: No unusual themes  PERCEPTION: Within normal limits  ORIENTATION: Alert and Oriented X 3.  CONCENTRATION: Good  MEMORY:   Recent: intact   Remote: intact  COGNITIVE FUNCTION: Average intelligence  JUDGMENT: Intact  IMPULSE CONTROL: Fair  INSIGHT: Fair    Risk Assessment    Self Injury: Patient Denies  Suicidal Ideation: Patient Denies  Homicidal Ideation: Patient Denies  Aggressive Behavior: Patient Denies    Results  none    BP Readings from Last 3 Encounters:   10/11/15 128/70   09/13/15 122/67   09/11/15 124/82       Assessment  FORMULATION: Patient reports her overall mood is OK.  She is not sleeping as well, "Maybe 5 hours with the tossing and turning."  She is not sure what may be causing the change.  She has not noticed any change in her emotional sensitivity with the increase in Lamictal.  There also is not change on the assessments.   We discussed decreasing the dose back to 100 mg of the Lamictal as there has been no benefit and her sleep is not as good.  She is trying to work on the OCD which also may be making her anxiety a little higher.    Recommendations/Plan and Rationale  PLAN: Decrease Lamictal back to 100 mg daily.  Continue other medications.  Lipid panel and fasting glucose to monitor for metabolic side effects from the Abilify.

## 2015-10-12 ENCOUNTER — Other Ambulatory Visit: Payer: Self-pay | Admitting: Psychiatry

## 2015-10-12 MED ORDER — ARIPIPRAZOLE 15 MG PO TABS *I*
ORAL_TABLET | ORAL | 2 refills | Status: DC
Start: 2015-10-12 — End: 2016-01-29

## 2015-10-16 ENCOUNTER — Ambulatory Visit: Payer: Self-pay

## 2015-10-16 DIAGNOSIS — F3341 Major depressive disorder, recurrent, in partial remission: Secondary | ICD-10-CM

## 2015-10-16 NOTE — Progress Notes (Signed)
Behavioral Health Progress Note     LENGTH OF SESSION: 30 minutes    Contact Type:  Location: On Site    Face to Face     Problem(s)/Goals Addressed from Treatment Plan:    Problem 1:   R PSY TP PROBLEM 09/20/2015   1ST TREATMENT PLAN PROBLEM Depression and anxiety       Goal for this problem:    R PSY TP GOAL 09/20/2015   1ST TREATMENT PLAN GOAL Learn DBT and PST skills to manage affect       Progress towards this goal: Problem resolving. Comment: Patient reports a stable mood.      Mental Status Exam:  APPEARANCE: Appears stated age, Well-groomed, Casual  ATTITUDE TOWARD INTERVIEWER: Cooperative  MOTOR ACTIVITY: WNL (within normal limits)  EYE CONTACT: Direct and Avoidant  SPEECH: Normal rate and tone  AFFECT: Full Range  MOOD: Lively and Neutral  THOUGHT PROCESS: Normal and Circumstantial  THOUGHT CONTENT: No unusual themes and Negative Rumination  PERCEPTION: No evidence of hallucinations  CURRENT SUICIDAL IDEATION: patient denies  CURRENT HOMICIDAL IDEATION: Patient denies  ORIENTATION: Alert and Oriented X 3.  CONCENTRATION: Good  MEMORY:   Recent: intact   Remote: intact  COGNITIVE FUNCTION: Average intelligence  JUDGMENT: Intact  IMPULSE CONTROL: Fair  INSIGHT: Good    Risk Assessment:  ASSESSMENT OF RISK FOR SUICIDAL BEHAVIOR  Changes in risk for suicide from baseline Formulation of Risk and/or previous intake, including newly identified risk, if any: none    Violence risk was assessed and No Change noted from baseline formulation of risk and/or previous assessment.     Session Content:: Patient reports a stable mood. She said that this is despite struggling with her feelings about her Father's illness. Writer validated patient's sadness. Writer and patient discussed patient using Mindfulness skills and working to stay present centered. Patient said that she has to have two medical procedures this week. Patient said that she has not made much progress with her exposure work. She said that she can't make it  an hour and her medication provider said that she needed to wait 2 hours. Writer and patient discussed patient's use of skills and working to try other skills to manage this behavior. Patient told Probation officer that her parents are seeing the grandchildren again. She said that her brother told them that he was the one who called CPS about his wife. Writer talked to patient about her ability to manage her emotions and reactivity and working to transition out of treatment when her medications are stable.        Visit Diagnosis:    ICD-10-CM ICD-9-CM   1. MDD (major depressive disorder), recurrent, in partial remission F33.41 296.35       Interventions:  Dialectical Behavioral Therapy (DBT), Supportive Psychotherapy, Termination process discussed    Current Treatment Plan   Created/Updated On 09/20/2015   Next Treatment Plan Due 12/26/2015     Plan:  Psychotherapy continues as described in care plan; plan remains the same.    NEXT APPT: 11/08/15 @3 :[place], LCSW

## 2015-10-17 ENCOUNTER — Other Ambulatory Visit
Admission: RE | Admit: 2015-10-17 | Discharge: 2015-10-17 | Disposition: A | Discharge status: Home / Self Care | Payer: Self-pay | Source: Ambulatory Visit

## 2015-10-17 ENCOUNTER — Encounter: Payer: Self-pay | Admitting: Primary Care

## 2015-10-17 ENCOUNTER — Ambulatory Visit: Payer: Self-pay | Admitting: Primary Care

## 2015-10-17 VITALS — BP 126/86 | HR 76 | Temp 97.7°F | Ht 68.0 in | Wt 291.8 lb

## 2015-10-17 DIAGNOSIS — N907 Vulvar cyst: Secondary | ICD-10-CM

## 2015-10-17 DIAGNOSIS — A630 Anogenital (venereal) warts: Secondary | ICD-10-CM

## 2015-10-17 LAB — CBC
Hematocrit: 39 % (ref 34–45)
Hemoglobin: 12.4 g/dL (ref 11.2–15.7)
MCH: 27 pg/cell (ref 26–32)
MCHC: 32 g/dL (ref 32–36)
MCV: 85 fL (ref 79–95)
Platelets: 393 10*3/uL — ABNORMAL HIGH (ref 160–370)
RBC: 4.7 MIL/uL (ref 3.9–5.2)
RDW: 14.4 % (ref 11.7–14.4)
WBC: 12.1 10*3/uL — ABNORMAL HIGH (ref 4.0–10.0)

## 2015-10-17 LAB — BASIC METABOLIC PANEL
Anion Gap: 18 — ABNORMAL HIGH (ref 7–16)
CO2: 19 mmol/L — ABNORMAL LOW (ref 20–28)
Calcium: 9.7 mg/dL (ref 8.8–10.2)
Chloride: 102 mmol/L (ref 96–108)
Creatinine: 0.88 mg/dL (ref 0.51–0.95)
GFR,Black: 94 *
GFR,Caucasian: 82 *
Glucose: 137 mg/dL — ABNORMAL HIGH (ref 60–99)
Lab: 10 mg/dL (ref 6–20)
Potassium: 4.2 mmol/L (ref 3.3–5.1)
Sodium: 139 mmol/L (ref 133–145)

## 2015-10-17 NOTE — Procedures (Addendum)
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  Informed consent was signed prior to the procedure being performed.    Procedure: Using clean technique, the lesion was initially cleaned with alcohol and then anesthetized with 2 ml of 1% lidocaine with epinephrine.  Once appropriately anesthetized, the lesion was removed using  #11 blade and iris scissor dissection in 2 pieces.  The specimen was sent to pathology.  Patient tolerated procedure well with minimal pain or discomfort.  There were no immediate post-procedure complications.  The area of excision was left open to air.    Additional group of 3 lesions with velvety cauliflower like changes suspicious for condyloma acuminata were visualized medial to the cyst lesion and also one additional larger lesion (1cm in size) on the L medial thigh. These areas were treated with TCA application x3 with +acetowhite changes noted.    Correct pt (2 identifiers)? Y  Correct procedure/site? Y  Correct materials? Y  Participants? Patient, [name], MD   Time started: 120pm  Time ended: 140pm        Post Procedure Instructions: Patient was counseled on wound care with bandaging and antibiotic ointment.  If site becomes irritated, ulcerated, red, develops discharge, or if the patient has any other concerns she was informed to call our office. Will f/u re: pathology of cyst via mychart    [name], MD  [facility]

## 2015-10-17 NOTE — Patient Instructions (Signed)
Biopsy    Why is this procedure done?   A biopsy looks at cells that are not normal. This will see if the cells are from an infection or other reasons that might cause swelling. The doctor may be looking for cancer or other tumors. The doctor may be looking for other health problems. Your doctor will decide what type of biopsy you need. This will depend on the size and location of the growth or tumor.     What will the results be?   The biopsy sample will be sent to a lab where a special doctor will look at the cells under a microscope. Your doctor may ask for other tests to double check the biopsy reading.  What happens before the procedure?   · Your doctor will take your history and do an exam. Talk to the doctor about:  ¨ All the drugs you are taking. Be sure to include all prescription and over-the-counter (OTC) drugs, and herbal supplements. Tell the doctor about any drug allergy. Bring a list of drugs you take with you.  ¨ Any bleeding problems. Be sure to tell your doctor if you are taking any drugs that may cause bleeding. Some of these are Coumadin, ibuprofen, Aleve (naproxen), or aspirin. Certain vitamins and herbs, such as garlic and fish oil, may also add to the risk for bleeding. You may need to stop these drugs as well. Talk to your doctor about them.  ¨ If you need to stop eating or drinking before your procedure  · You will not be allowed to drive right away after the procedure. Ask a family member or a friend to drive you home.  What happens during the procedure?   · Biopsies done in an outpatient clinic:  ¨ These are often from outer parts of the body like the skin.  ¨ You may be given a drug to help you relax or to lessen the pain.  ¨ The doctor may use small, sharp tools to get tissue samples.  ¨ The tissue samples will be sent to the lab for testing.  · Biopsies done in an operating room:  ¨ These are most often from the inside parts of the body like the liver, kidney, or lungs.  ¨ Once  you are in the operating room, the staff will put an I.V. in your arm to give you fluids and drugs. You will be given a drug to make you sleepy. It will also help you stay pain free during the procedure.  ¨ Your doctor may use special tools to get tissue samples from the inside of your body.  ¨ The tissue samples will be sent to the lab for testing.  What happens after the procedure?   · The staff may apply pressure to stop any bleeding. They will also cover the skin with a clean bandage.  · Stitches may be needed to close some cut sites.  · Special care may be needed if the tissue samples were taken from inside your body.  · You may be given drugs to help with pain and to treat infection.  · Ask your doctor when the results of the test will be available.  What care is needed at home?   · Ask your doctor what you need to do when you go home. Make sure you ask questions if you do not understand what the doctor says. This way you will know what you need to do.  · Talk to your doctor   about how to care for your cut site. Ask your doctor about:  ¨ When you should change your bandages  ¨ When you may take a bath or shower  ¨ If you need to be careful with lifting things over 10 pounds  ¨ When you may go back to your normal activities  · Take your drugs as ordered by your doctor.  What follow-up care is needed?   · Your doctor may ask you to make visits to the office to check on your progress. Be sure to keep these visits.   · If you have stitches or staples, you will need to have them taken out. Your doctor will often want to do this in 1 to 2 weeks.  · It often takes at least 3 to 7 days before the biopsy results are ready. The results will help your doctor know what kind of problem you have. Together you can make a plan for more care.  What problems could happen?   · Pain, bleeding, or infection of the biopsy site  · Scarring  Where can I learn more?   Better Health  Channel  http://www.betterhealth.vic.gov.au/bhcv2/bhcarticles.nsf/pages/Biopsy?open   KidsHealth  http://kidshealth.org/parent/system/medical/biopsy.html   Last Reviewed Date   2012-11-20  Consumer Information Use and Disclaimer   This information is not specific medical advice and does not replace information you receive from your health care provider. This is only a brief summary of general information. It does NOT include all information about conditions, illnesses, injuries, tests, procedures, treatments, therapies, discharge instructions or life-style choices that may apply to you. You must talk with your health care provider for complete information about your health and treatment options. This information should not be used to decide whether or not to accept your health care provider’s advice, instructions or recommendations. Only your health care provider has the knowledge and training to provide advice that is right for you.  Copyright   Copyright © 2015 [employer] and its affiliates and/or licensors. All rights reserved.

## 2015-10-19 LAB — SURGICAL PATHOLOGY

## 2015-10-20 ENCOUNTER — Other Ambulatory Visit: Payer: Self-pay | Admitting: Internal Medicine

## 2015-10-20 LAB — POCT GLUCOSE: Glucose POCT: 130 mg/dL — ABNORMAL HIGH (ref 60–98)

## 2015-10-22 ENCOUNTER — Telehealth: Payer: Self-pay | Admitting: Primary Care

## 2015-10-22 NOTE — Telephone Encounter (Signed)
[facility] / PRACTICE CALL NOTE    Date and Time of Call: 6/17  9pm    PCP:  [name], MD     Problem: area where heart monitor was removed is very red and irritated    39yof describes being seen in ED last night at 4am for redness and pain at site of event monitor removal (removed Friday 6/16). Also had itching and irritation of her tongue and throat. Was told she was having allergic reaction to the glue and given prednisone and benadryl. Has been using benadryl ATC today. Currently having return of the itching throat. Also describes that the wound is worsening as well.    Impression and Plan: 39yof with suspected allergic reaction to recent procedure and skin adhesive. Having some throat itching. Reviewed with pt I suspected she needed additional prednisone, but would prefer she be evaluated again in ED, monitored for improvement with repeat prednisone use if deemed appropriate, given her upper airway sx. She expressed understanding and will return to ED tonight      The patient is instructed to call back if patient is having worsening symptoms or lack of improvement of current symptoms with the above treatment, and patient expressed understanding.    [name], MD

## 2015-10-23 ENCOUNTER — Telehealth: Payer: Self-pay

## 2015-10-23 NOTE — Telephone Encounter (Signed)
Calling Pt - went in to [facility]  06/16, 06/17-06/18  Pt stated that she is okay - a little better -she still NEEDS to take the inhaler - very itchy, still some redness.   Scheduled appt first time in the morning with PCP -  If concerns come up Pt is aware to call the office - Pt verbalized understanding

## 2015-10-24 ENCOUNTER — Ambulatory Visit: Payer: Self-pay | Admitting: Primary Care

## 2015-10-24 ENCOUNTER — Encounter: Payer: Self-pay | Admitting: Primary Care

## 2015-10-24 VITALS — BP 120/80 | HR 82 | Temp 97.1°F | Resp 14 | Ht 68.0 in | Wt 289.0 lb

## 2015-10-24 DIAGNOSIS — Z0000 Encounter for general adult medical examination without abnormal findings: Secondary | ICD-10-CM

## 2015-10-24 DIAGNOSIS — L259 Unspecified contact dermatitis, unspecified cause: Secondary | ICD-10-CM

## 2015-10-24 DIAGNOSIS — G8918 Other acute postprocedural pain: Secondary | ICD-10-CM

## 2015-10-24 MED ORDER — PREDNISONE 50 MG PO TABS *I*
50.0000 mg | ORAL_TABLET | Freq: Every day | ORAL | 0 refills | Status: AC
Start: 2015-10-24 — End: 2015-10-29

## 2015-10-24 MED ORDER — EASIVENT MISC *I*
0 refills | Status: DC
Start: 2015-10-24 — End: 2015-11-08

## 2015-10-24 NOTE — Patient Instructions (Signed)
[facility]  [id number]  11/09/15 at 10:15 am  [address], [place]   [place],  [address]

## 2015-10-24 NOTE — Progress Notes (Signed)
[facility]    SUBJECTIVE    Pt is here to discuss:    Chief Complaint   Patient presents with    Allergic Reaction     1. Seen in ED - seen at [facility] ED 6/17 early 4AM and again 10pm with local redness and pruritus of the chest wall after loop recorder removal and use of surgical wound glue, along with itching of the throat. Felt to be allergic reaction to wound glue. Initial ED visit was treated with pred 15mx1 and d/c-ed home instructed to take benadryl. During 2nd ed visit rec'd 2 nebs, benadryl, another dose of prednisone along with 3d course upon d/c. Has her last dose (5th total) today.    Still requires inhaler (albuterol) and still having trouble breathing and coughing.   Throat itching  Sx improved but not resolved with prednisone, inhaler, benadryl    BGs 170s the last 2 AMs on steroids  Denies severe insomnia or feelign restless with meds  No bleeding from site    PMH / Family Hx / Social Hx  Patient's medications, allergies, problem list, past medical, social histories were reviewed and notable for:    Current Outpatient Prescriptions   Medication Sig Note    ARIPiprazole (ABILIFY) 15 MG tablet Take 1/2 tablet daily in the morning.     Insulin Disposable Pump (V-GO 30) KIT Use daily; 30ct/month.     lamoTRIgine (LAMICTAL) 100 MG tablet Take 1 tablet (100 mg total) by mouth daily     mirtazapine (REMERON) 7.5 MG tablet Take 1 tablet (7.5 mg total) by mouth nightly     insulin lispro (HUMALOG) 100 UNIT/ML injection vial For VGO: Dinner 198J(6 clicks) ; B & L 4- 6 units. BG 100 - 150 8u (4clk), 151 - 200 10U (5clk), >201 12U ( 6 ck). MDD 72U (36 clk)     busPIRone (BUSPAR) 30 MG tablet Take 1 tablet (30 mg total) by mouth 2 times daily     TRULICITY 01.91MYN/8.2NFSOPN INJECT 0.75 MG INTO THE SKIN ONCE A WEEK     promethazine (PHENERGAN) 12.5 MG tablet Swish and swallow 12.5 mg 06/12/2015: [place]: [facility] Take 1 tablet by mouth every 8 (eight) hours  as needed for Nausea / Vomiting.    atorvastatin (LIPITOR) 40 MG tablet TAKE 1 TABLET (40 MG TOTAL) BY MOUTH DAILY (WITH DINNER)     Non-System Medication Helmet to prevent head injuries;   multiple syncopal episodes and hitting head repeatedly.     DULoxetine (CYMBALTA) 60 MG capsule Take 1 capsule (60 mg total) by mouth daily     pantoprazole (PROTONIX) 40 MG EC tablet TAKE 1 TABLET (40 MG TOTAL) BY MOUTH DAILY SWALLOW WHOLE. DO NOT CRUSH, BREAK, OR CHEW.     fluticasone (FLONASE) 50 MCG/ACT nasal spray 1 spray by Nasal route daily     dicyclomine (BENTYL) 20 MG tablet TAKE 1 TABLET (20 MG TOTAL) BY MOUTH 4 TIMES DAILY (BEFORE MEALS AND NIGHTLY)     midodrine (PROAMATINE) 10 MG tablet Take 10 mg by mouth 3 times daily 02/27/2015: Received from: External Pharmacy    VENTOLIN HFA 108 (90 BASE) MCG/ACT inhaler Inhale 1-2 puffs into the lungs every 4-6 hours as needed for Wheezing     clonazePAM (KLONOPIN) 0.5 MG tablet Take 1 tablet (0.5 mg total) by mouth 2 times daily as needed   Max daily dose: 1 mg     FREESTYLE LITE test  strip Use four times daily as directed for 250.02     blood glucose monitor system Brand: cheapest brand available per her insurance.  Use as directed.     lancets Brand Free Style Lite; Use 2 times per day as directed for blood glucose testing.     SUMAtriptan refill (IMITREX STATDOSE) 6 MG/0.5ML injection Inject 6 mg into the skin once as needed for Migraine   May repeat once after 1 hour if needed.     atenolol (TENORMIN) 25 MG tablet Take 25 mg by mouth daily     Alcohol Swabs (ALCOHOL WIPES) PADS Use BID for BG check     famciclovir (FAMVIR) 500 MG tablet Take 1 tablet (500 mg total) by mouth 3 times daily as needed     topiramate (TOPAMAX) 100 MG tablet Take 100 mg by mouth 2 times daily     cetirizine (ZYRTEC) 10 MG tablet Take 10 mg by mouth daily     levonorgestrel (MIRENA) 20 MCG/24HR IUD 1 each by Intrauterine route once     SUMAtriptan (IMITREX) 50 MG tablet Take  1 tablet (50 mg total) by mouth as needed for Migraine   Take at onset of headache. May repeat once in 2 hours.     Non-System Medication The above patient is followed in our clinic and cannot resume work permanently.      OBJECTIVE  Vitals:    10/24/15 0805   BP: 120/80   Pulse: 82   Resp: 14   Temp: 36.2 C (97.1 F)   TempSrc: Temporal   SpO2: 96%   Weight: 131.1 kg (289 lb)   Height: 1.727 m (_0 )     Body mass index is 43.94 kg/(m^2).      General: well-appearing Caucasian female, pleasant & conversant, in NAD  Lungs: Normal resp effort. Speaking in full sentences. No stridor or increased WOB. CTAB.  No crackles or wheezes. Chest wall with minimal erythema of mid superior aspect (near manubrium). Wound glue present at LUSB, no erythema surrounding it or drainage. Glue residue removed easily be me, skin intact underneath.  Cardiac: RRR, no M/R/G. No pedal edema. Pulses 2+ b/l.     Psych: AAOx3, normal affect and mood. Insight and judgement intact.           ASSESSMENT & PLAN  1. Contact dermatitis, unspecified contact dermatitis type, unspecified trigger  2 Post procedure discomfort  With some upper airway itching. Currently without signs of resp distress. Will extend prednisone for total of 10d (additional 5d course provided today). Wound glue removed, expect now that reaction will dissipate more quickly. F/u if desires a taper extension after 10d from now, also encouraged to d/c earlier if possible. Continue antihistamine.    2. Healthcare maintenance  - Mammography screening bilateral; Future        Follow-up: prn      [name], MD  [facility]  10/24/2015  8:14 AM        ______________________

## 2015-10-25 ENCOUNTER — Other Ambulatory Visit: Payer: Self-pay | Admitting: Family Medicine

## 2015-10-25 ENCOUNTER — Other Ambulatory Visit: Payer: Self-pay | Admitting: Psychiatry

## 2015-10-25 MED ORDER — BUSPIRONE HCL 30 MG PO TABS *I*
30.0000 mg | ORAL_TABLET | Freq: Two times a day (BID) | ORAL | 2 refills | Status: DC
Start: 2015-10-25 — End: 2016-01-03

## 2015-10-25 MED ORDER — ATORVASTATIN CALCIUM 40 MG PO TABS *I*
40.0000 mg | ORAL_TABLET | Freq: Every day | ORAL | 5 refills | Status: DC
Start: 2015-10-25 — End: 2016-05-08

## 2015-10-31 ENCOUNTER — Encounter: Payer: Self-pay | Admitting: Radiology

## 2015-11-01 ENCOUNTER — Other Ambulatory Visit: Payer: Self-pay | Admitting: Family Medicine

## 2015-11-01 ENCOUNTER — Encounter: Payer: Self-pay | Admitting: Primary Care

## 2015-11-05 ENCOUNTER — Other Ambulatory Visit: Payer: Self-pay | Admitting: Family Medicine

## 2015-11-06 ENCOUNTER — Other Ambulatory Visit: Payer: Self-pay

## 2015-11-06 MED ORDER — PANTOPRAZOLE SODIUM 40 MG PO TBEC *I*
40.0000 mg | DELAYED_RELEASE_TABLET | Freq: Every day | ORAL | 5 refills | Status: DC
Start: 2015-11-06 — End: 2016-05-16

## 2015-11-08 ENCOUNTER — Ambulatory Visit: Payer: Self-pay | Admitting: Psychiatry

## 2015-11-08 ENCOUNTER — Encounter: Payer: Self-pay | Admitting: Primary Care

## 2015-11-08 ENCOUNTER — Ambulatory Visit: Payer: Self-pay

## 2015-11-08 ENCOUNTER — Other Ambulatory Visit: Payer: Self-pay | Admitting: Primary Care

## 2015-11-08 ENCOUNTER — Encounter: Payer: Self-pay | Admitting: Psychiatry

## 2015-11-08 VITALS — BP 134/75 | HR 78 | Resp 18 | Ht 67.99 in | Wt 294.0 lb

## 2015-11-08 DIAGNOSIS — F419 Anxiety disorder, unspecified: Secondary | ICD-10-CM

## 2015-11-08 DIAGNOSIS — F603 Borderline personality disorder: Secondary | ICD-10-CM

## 2015-11-08 DIAGNOSIS — F3341 Major depressive disorder, recurrent, in partial remission: Secondary | ICD-10-CM

## 2015-11-08 DIAGNOSIS — F331 Major depressive disorder, recurrent, moderate: Secondary | ICD-10-CM

## 2015-11-08 MED ORDER — SUMATRIPTAN SUCCINATE 50 MG PO TABS *I*
50.0000 mg | ORAL_TABLET | ORAL | 5 refills | Status: DC | PRN
Start: 2015-11-08 — End: 2016-10-18

## 2015-11-08 MED ORDER — MIRTAZAPINE 15 MG PO TABS *I*
15.0000 mg | ORAL_TABLET | Freq: Every evening | ORAL | 1 refills | Status: DC
Start: 2015-11-08 — End: 2016-01-15

## 2015-11-08 MED ORDER — LAMOTRIGINE 150 MG PO TABS *I'
150.0000 mg | ORAL_TABLET | Freq: Every day | ORAL | 1 refills | Status: DC
Start: 2015-11-08 — End: 2016-01-18

## 2015-11-08 MED ORDER — TOPIRAMATE 100 MG PO TABS *I*
100.0000 mg | ORAL_TABLET | Freq: Two times a day (BID) | ORAL | 5 refills | Status: DC
Start: 2015-11-08 — End: 2016-05-16
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
## 2015-11-08 NOTE — Addendum Note (Signed)
Addended by: [name] Freer on: 11/08/2015 03:36 PM     Modules accepted: Orders
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
## 2015-11-09 ENCOUNTER — Other Ambulatory Visit: Payer: Self-pay | Admitting: Primary Care

## 2015-11-09 ENCOUNTER — Other Ambulatory Visit: Payer: Self-pay | Admitting: Gastroenterology

## 2015-11-09 MED ORDER — DICYCLOMINE HCL 20 MG PO TABS *I*
20.0000 mg | ORAL_TABLET | Freq: Four times a day (QID) | ORAL | 5 refills | Status: DC
Start: 2015-11-09 — End: 2016-01-03

## 2015-11-14 ENCOUNTER — Encounter: Payer: Self-pay | Admitting: Family Medicine

## 2015-11-14 ENCOUNTER — Ambulatory Visit: Payer: Self-pay | Admitting: Family Medicine

## 2015-11-14 VITALS — BP 126/86 | HR 80 | Temp 98.3°F | Ht 68.11 in | Wt 291.6 lb

## 2015-11-14 DIAGNOSIS — Y92009 Unspecified place in unspecified non-institutional (private) residence as the place of occurrence of the external cause: Secondary | ICD-10-CM

## 2015-11-14 DIAGNOSIS — R112 Nausea with vomiting, unspecified: Secondary | ICD-10-CM

## 2015-11-14 DIAGNOSIS — W19XXXA Unspecified fall, initial encounter: Secondary | ICD-10-CM

## 2015-11-14 MED ORDER — PROMETHAZINE HCL 12.5 MG RE SUPP *I*
12.5000 mg | Freq: Four times a day (QID) | RECTAL | 1 refills | Status: DC | PRN
Start: 2015-11-14 — End: 2015-12-11

## 2015-11-14 NOTE — Progress Notes (Signed)
Subjective:     Patient ID: Kaylee Harris is a 40 y.o. female.    HPI  [place] [address] last nite about 9pm ;hitting head on corner of counter; center of forehead, just in to hair line.   + syncopal episode; wife was home at the time. Typical of her syncopal episodes;   Denies feeling SOB, Palpitations or dizzy prior to fall.   Has been seen by neurology and cardiology in the past for frequent syncopal episodes.   Multiple images done at ED through out the city for head injuries related to frequent falls.   Has not been able to obtain insurance coverage for helmet; cannot afford the out of pocked cost of helmet.     + Dizziness, + blurred vision, n/v since injury. Had tried to take promethazine; could not keep it down   Vomited 3 times during the nite, twice this morning. Starting to slow down episodes; farther apart.   Currently with + HA, dizziness improving;blurred vision has resolved.   States she has not been able to keep any fluids or food down this am.     Soc; lives with wife, currently not working;     Patient's medications, allergies, past medical, surgical, social and family histories were reviewed and updated     Review of Systems  Per above      Objective:   Physical Exam  Visit Vitals    BP 126/86 (BP Location: Right arm, Patient Position: Sitting, Cuff Size: adult)    Pulse 80    Temp 36.8 C (98.3 F) (Temporal)    Ht 1.73 m (5' 8.11")    Wt 132.3 kg (291 lb 9.6 oz)    BMI 44.19 kg/m2     General appearance: alert, obese, well appearing, and in no distress.  SKIN: warm, dry, good turgor. No rashes. 1x1 cm L shaped superfical laceration to center of forehead, just into hair line; + tenderness, mild swelling and ecchymosis; no step off or crepitus.   EYE: PERRLA, conjunctiva clear, lids normal  ENMT: TMs normal, nares patent, lips without lesions, oropharynx clear. moist, good dentition  NECK:trachea midline, no masses, thyroid symmetrical with no lesion or nodules appreciated.  LYMPH: no cervical or  supraclavicular LAD appreciated  RESPIRATORY: unlabored respiratory effort, lungs clear to auscultation, no wheezes, rales or rhonchi, symmetrical air entry.   CVS exam: normal rate, regular rhythm, normal S1, S2, no murmurs, rubs, clicks or gallops. No edema  Neurological: Alert and oriented x3, steady gait with rolling walker. CNS II-XII intact.   Psych: alert and oriented x3, normal affect and mood, good eye contact, neatly dressed and groomed. Logical flow of thoughts and conversation.      Assessment/Plan:  1. Nausea/vomiting/dizziness; vss. Improved over the past 12-16 hours; will order promethazine suppository to help with hydration; reviewed post concussion syndrome with patient; should continue to get better, not worse over the nex 24-48 hours; if she feels she is getting worse; needs to go to ED for head imaging;   2. S/p fall at home; typical syncopal episode with no warning; patient has been worked up neurologically and cardiologically with no specific cause; felt to have psychogenic component,. Reviewed with patient she should be wearing helmet with frequent fall and head injuries; states not covered by insurance; cannot afford to buy out of pocket; reviewed various 2nd hand stores with used sporting equipment; she would benefit the investment in a used lacrosse type helmet. Verbalized understanding.

## 2015-11-22 ENCOUNTER — Encounter: Payer: Self-pay | Admitting: Primary Care

## 2015-11-22 ENCOUNTER — Ambulatory Visit: Payer: Self-pay | Admitting: Primary Care

## 2015-11-22 VITALS — BP 114/76 | HR 88 | Temp 97.7°F | Ht 68.11 in | Wt 290.2 lb

## 2015-11-22 DIAGNOSIS — N9089 Other specified noninflammatory disorders of vulva and perineum: Secondary | ICD-10-CM

## 2015-11-22 DIAGNOSIS — IMO0002 Reserved for concepts with insufficient information to code with codable children: Secondary | ICD-10-CM

## 2015-11-22 DIAGNOSIS — N907 Vulvar cyst: Secondary | ICD-10-CM

## 2015-11-22 NOTE — Progress Notes (Signed)
[facility]    SUBJECTIVE    Pt is here to discuss:    Chief Complaint   Patient presents with    Cyst     2 areas pt states one on the outside of the labia and one on the inside of  labia          1. Labial cyst - new locations, same side as previous one last month. Started at inferior aspect of labia majora. Painful lump. Yesterday seemed to burst and drain, improved pain today. Also noticed discomfort near clitoral hood and pain with urination, wife looked it the area and noticed a bruised/bloody swelling. Pt concerned about this one more. Admits to subjective chills and BG in 190s this AM. Hoping it's not infected      PMH / Family Hx / Social Hx  Patient's medications, allergies, problem list, past medical, social histories were reviewed and notable for    Current Outpatient Prescriptions   Medication Sig Note    promethazine (PHENERGAN) 12.5 MG suppository Place 1 suppository (12.5 mg total) rectally every 6 hours as needed for Nausea or Vomiting     dicyclomine (BENTYL) 20 MG tablet Take 1 tablet (20 mg total) by mouth 4 times daily (before meals and nightly)     topiramate (TOPAMAX) 100 MG tablet Take 1 tablet (100 mg total) by mouth 2 times daily     SUMAtriptan (IMITREX) 50 MG tablet Take 1 tablet (50 mg total) by mouth as needed for Migraine   Take at onset of headache. May repeat once in 2 hours.     lamoTRIgine (LAMICTAL) 150 MG tablet Take 1 tablet (150 mg total) by mouth daily     mirtazapine (REMERON) 15 MG tablet Take 1 tablet (15 mg total) by mouth nightly     pantoprazole (PROTONIX) 40 MG EC tablet Take 1 tablet (40 mg total) by mouth daily   SWALLOW WHOLE. DO NOT CRUSH, BREAK, OR CHEW.     busPIRone (BUSPAR) 30 MG tablet Take 1 tablet (30 mg total) by mouth 2 times daily     atorvastatin (LIPITOR) 40 MG tablet Take 1 tablet (40 mg total) by mouth daily (with dinner)     ARIPiprazole (ABILIFY) 15 MG tablet Take 1/2 tablet daily in the morning.     Insulin Disposable Pump  (V-GO 30) KIT Use daily; 30ct/month.     insulin lispro (HUMALOG) 100 UNIT/ML injection vial For VGO: Dinner 45X (6 clicks) ; B & L 4- 6 units. BG 100 - 150 8u (4clk), 151 - 200 10U (5clk), >201 12U ( 6 ck). MDD 72U (36 clk)     TRULICITY 6.46 OE/3.2ZY SOPN INJECT 0.75 MG INTO THE SKIN ONCE A WEEK     promethazine (PHENERGAN) 12.5 MG tablet Swish and swallow 12.5 mg 06/12/2015: Received from: [place]: Take 1 tablet by mouth every 8 (eight) hours as needed for Nausea / Vomiting.    DULoxetine (CYMBALTA) 60 MG capsule Take 1 capsule (60 mg total) by mouth daily     fluticasone (FLONASE) 50 MCG/ACT nasal spray 1 spray by Nasal route daily     midodrine (PROAMATINE) 10 MG tablet Take 10 mg by mouth 3 times daily 02/27/2015: Received from: External Pharmacy    VENTOLIN HFA 108 (90 BASE) MCG/ACT inhaler Inhale 1-2 puffs into the lungs every 4-6 hours as needed for Wheezing     clonazePAM (KLONOPIN) 0.5 MG tablet Take 1 tablet (0.5 mg total) by  mouth 2 times daily as needed   Max daily dose: 1 mg     FREESTYLE LITE test strip Use four times daily as directed for 250.02     blood glucose monitor system Brand: cheapest brand available per her insurance.  Use as directed.     lancets Brand Free Style Lite; Use 2 times per day as directed for blood glucose testing.     atenolol (TENORMIN) 25 MG tablet Take 25 mg by mouth daily     Alcohol Swabs (ALCOHOL WIPES) PADS Use BID for BG check     famciclovir (FAMVIR) 500 MG tablet Take 1 tablet (500 mg total) by mouth 3 times daily as needed     cetirizine (ZYRTEC) 10 MG tablet Take 10 mg by mouth daily     levonorgestrel (MIRENA) 20 MCG/24HR IUD 1 each by Intrauterine route once     Non-System Medication The above patient is followed in our clinic and cannot resume work permanently.        ROS  No vaginal dishcarge, hematuria, cloudy urine      OBJECTIVE  Vitals:    11/22/15 1147   BP: 114/76   Pulse: 88   Temp: 36.5 C (97.7 F)    Weight: 131.6 kg (290 lb 3.2 oz)   Height: 1.73 m (5' 8.11")     Body mass index is 43.98 kg/(m^2).      General: well-appearing obese Caucasian female, pleasant & conversant, in NAD  GU: Small superficial cyst on L inferior labia majora with open sinus tract and clear serous drainage. No fluctuation, smaller than pea sized. No significant erythema. Within L superior labia minora is a pea sized gland with overlying dark red/purple abrasion. No obvious sore, sinus tract, vesicle.  no discharge at vaginal introitus   Psych: AAOx3, normal affect and mood. Insight and judgement intact.           ASSESSMENT & PLAN  1. Labia irritation (labia minora lesion)  2. Labial cyst (labia majora lesion)  3. DM (diabetes mellitus), type 2, uncontrolled    Reassurance provided today no signs of infection which was her main concern given her coexisting DM which she knows puts her at increased risk of this. Recommended barrier ointment (desitin or vaseline) over the labia minora lesion as this is uncomfortable for her. Continue warm compresses of the labia majora lesion. Unclear if either will recur in future. I would feel comfortable removing the labia majora lesion but reviewed if the labia minora gland continues to recurrently irritate her I will refer to GYN for consideration of excision. Recommended to have her wife monitor the area every 3-4 days or if change in sx (increasing BGs to 300s+, increasing pain, fever), and if any concern to return for reevaulation for infection.           Follow-up: prn      [name], MD  [facility]  11/22/2015  1:38 PM        ______________________

## 2015-11-27 ENCOUNTER — Ambulatory Visit: Payer: Self-pay

## 2015-11-27 DIAGNOSIS — F3340 Major depressive disorder, recurrent, in remission, unspecified: Secondary | ICD-10-CM
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
## 2015-11-28 ENCOUNTER — Other Ambulatory Visit: Payer: Self-pay | Admitting: Gastroenterology

## 2015-11-29 ENCOUNTER — Other Ambulatory Visit: Payer: Self-pay

## 2015-11-29 LAB — POCT GLUCOSE
Glucose POCT: 113 mg/dL — ABNORMAL HIGH (ref 60–98)
Glucose POCT: 115 mg/dL — ABNORMAL HIGH (ref 60–98)
Glucose POCT: 135 mg/dL — ABNORMAL HIGH (ref 60–98)
Glucose POCT: 141 mg/dL — ABNORMAL HIGH (ref 60–98)
Glucose POCT: 108 mg/dL — ABNORMAL HIGH (ref 60–98)

## 2015-11-30 LAB — BASIC METABOLIC PANEL
Anion Gap: 6 mEq/L — ABNORMAL LOW (ref 7–16)
CO2: 25 mEq/L (ref 20–31)
Calcium: 8.9 mg/dL (ref 8.6–10.2)
Chloride: 109 mEq/L (ref 99–109)
Creatinine: 0.92 mg/dL (ref 0.50–1.10)
Glucose: 126 mg/dL — ABNORMAL HIGH (ref 60–99)
Lab: 9 mg/dL (ref 9–23)
Potassium: 4.1 mEq/L (ref 3.5–5.1)
Sodium: 140 mEq/L (ref 136–145)
UN/Creat Ratio: 9.8 — ABNORMAL LOW (ref 10.0–20.0)

## 2015-11-30 LAB — CBC
Hematocrit: 35 % (ref 34–47)
Hemoglobin: 11.2 g/dL — ABNORMAL LOW (ref 11.5–16.0)
MCH: 26.9 pg (ref 26.0–34.0)
MCHC: 31.8 g/dL — ABNORMAL LOW (ref 32.0–36.0)
MCV: 85 fL (ref 81–99)
Platelets: 298 10*3/uL (ref 140–400)
RBC: 4.16 10*6/uL (ref 3.80–5.20)
RDW: 14.5 % (ref 11.5–15.0)
WBC: 10.6 10*3/uL — ABNORMAL HIGH (ref 4.0–10.0)

## 2015-11-30 LAB — POCT GLUCOSE
Glucose POCT: 127 mg/dL — ABNORMAL HIGH (ref 60–98)
Glucose POCT: 140 mg/dL — ABNORMAL HIGH (ref 60–98)
Glucose POCT: 136 mg/dL — ABNORMAL HIGH (ref 60–98)
Glucose POCT: 124 mg/dL — ABNORMAL HIGH (ref 60–98)

## 2015-12-01 LAB — POCT GLUCOSE
Glucose POCT: 118 mg/dL — ABNORMAL HIGH (ref 60–98)
Glucose POCT: 164 mg/dL — ABNORMAL HIGH (ref 60–98)
Glucose POCT: 106 mg/dL — ABNORMAL HIGH (ref 60–98)

## 2015-12-03 ENCOUNTER — Other Ambulatory Visit: Payer: Self-pay | Admitting: Gastroenterology

## 2015-12-04 ENCOUNTER — Encounter: Payer: Self-pay | Admitting: Primary Care

## 2015-12-04 ENCOUNTER — Ambulatory Visit: Payer: Self-pay | Admitting: Primary Care

## 2015-12-04 VITALS — BP 114/82 | HR 76 | Temp 99.4°F | Ht 68.11 in | Wt 284.4 lb

## 2015-12-04 DIAGNOSIS — R112 Nausea with vomiting, unspecified: Secondary | ICD-10-CM

## 2015-12-04 DIAGNOSIS — K589 Irritable bowel syndrome without diarrhea: Secondary | ICD-10-CM

## 2015-12-04 MED ORDER — PROMETHAZINE HCL 12.5 MG PO TABS *I*
12.5000 mg | ORAL_TABLET | Freq: Four times a day (QID) | ORAL | 0 refills | Status: DC | PRN
Start: 2015-12-04 — End: 2016-11-07

## 2015-12-04 MED ORDER — PROMETHAZINE HCL 25 MG/ML IJ SOLN *I*
25.0000 mg | Freq: Once | INTRAMUSCULAR | Status: AC
Start: 2015-12-04 — End: 2015-12-04
  Administered 2015-12-04: 25 mg via INTRAMUSCULAR

## 2015-12-04 MED ORDER — CHLORDIAZEPOXIDE-CLIDINIUM 5-2.5 MG PO CAPS *I*
1.0000 | ORAL_CAPSULE | Freq: Four times a day (QID) | ORAL | 0 refills | Status: DC
Start: 2015-12-04 — End: 2015-12-11

## 2015-12-04 NOTE — Patient Instructions (Signed)
[facility] ([place]) [phone]

## 2015-12-04 NOTE — Progress Notes (Signed)
[facility]    SUBJECTIVE    Pt is here to discuss:    Chief Complaint   Patient presents with    Abdominal Pain     f/u [facility]    Bloated         1. RLQ pain - hospitalized at [facility] 7/25-28 for RLQ pain, diarrhea, and leukocytosis.     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 Exam hospital details not known to me therefore I advised she return to ED if pain was severe. Repeat CT 7/30 unchanged, labs normal except for WBC of 14.6. Sent home after IV dilaudid    Today c/o persistent RLQ pain. Not taking any stool softeners or imodium and still having liquid stool. No bloody, more yellow and scant. Forcing fluids. No vomiting. No fevers. Subjective chills. Has ibuprofen/apap at home, bentyl, and phenergan (but running out). Asking for pain relief.  BGs are elevated in 190s which is not typical for her, worried about infection.     Denies dysuria, hematuria, vaginal d/c  CT's neg for pelvic fluid or LAD          PMH / Family Hx / Social Hx  Patient's medications, allergies, problem list, past medical, social histories were reviewed and notable for:    Current Outpatient Prescriptions   Medication Sig Note    promethazine (PHENERGAN) 12.5 MG suppository Place 1 suppository (12.5 mg total) rectally every 6 hours as needed for Nausea or Vomiting     dicyclomine (BENTYL) 20 MG tablet Take 1 tablet (20 mg total) by mouth 4 times daily (before meals and nightly)     topiramate (TOPAMAX) 100 MG tablet Take 1 tablet (100 mg total) by  mouth 2 times daily     SUMAtriptan (IMITREX) 50 MG tablet Take 1 tablet (50 mg total) by mouth as needed for Migraine   Take at onset of headache. May repeat once in 2 hours.     lamoTRIgine (LAMICTAL) 150 MG tablet Take 1 tablet (150 mg total) by mouth daily     mirtazapine (REMERON) 15 MG tablet Take 1 tablet (15 mg total) by mouth nightly     pantoprazole (PROTONIX) 40 MG EC tablet Take 1 tablet (40 mg total) by mouth daily   SWALLOW WHOLE. DO NOT CRUSH, BREAK, OR CHEW.     busPIRone (BUSPAR) 30 MG tablet Take 1 tablet (30 mg total) by mouth 2 times daily     atorvastatin (LIPITOR) 40 MG tablet Take 1 tablet (40 mg total) by mouth daily (with dinner)     ARIPiprazole (ABILIFY) 15 MG tablet Take 1/2 tablet daily in the morning.     Insulin Disposable Pump (V-GO 30) KIT Use daily; 30ct/month.     insulin lispro (HUMALOG) 100 UNIT/ML injection vial For VGO: Dinner 86V (6 clicks) ; B & L 4- 6 units. BG 100 - 150 8u (4clk), 151 - 200 10U (5clk), >201 12U ( 6 ck). MDD 72U (36 clk)  TRULICITY 0.63 KZ/6.0FU SOPN INJECT 0.75 MG INTO THE SKIN ONCE A WEEK     DULoxetine (CYMBALTA) 60 MG capsule Take 1 capsule (60 mg total) by mouth daily     fluticasone (FLONASE) 50 MCG/ACT nasal spray 1 spray by Nasal route daily     midodrine (PROAMATINE) 10 MG tablet Take 10 mg by mouth 3 times daily 02/27/2015: Received from: External Pharmacy    VENTOLIN HFA 108 (90 BASE) MCG/ACT inhaler Inhale 1-2 puffs into the lungs every 4-6 hours as needed for Wheezing     clonazePAM (KLONOPIN) 0.5 MG tablet Take 1 tablet (0.5 mg total) by mouth 2 times daily as needed   Max daily dose: 1 mg     FREESTYLE LITE test strip Use four times daily as directed for 250.02     blood glucose monitor system Brand: cheapest brand available per her insurance.  Use as directed.     lancets Brand Free Style Lite; Use 2 times per day as directed for blood glucose testing.     atenolol (TENORMIN) 25 MG tablet Take 25 mg by mouth daily      Alcohol Swabs (ALCOHOL WIPES) PADS Use BID for BG check     famciclovir (FAMVIR) 500 MG tablet Take 1 tablet (500 mg total) by mouth 3 times daily as needed     cetirizine (ZYRTEC) 10 MG tablet Take 10 mg by mouth daily     levonorgestrel (MIRENA) 20 MCG/24HR IUD 1 each by Intrauterine route once     Non-System Medication The above patient is followed in our clinic and cannot resume work permanently.        OBJECTIVE  Vitals:    12/04/15 1530   BP: 114/82   BP Location: Left arm   Patient Position: Sitting   Cuff Size: large adult   Pulse: 76   Temp: 37.4 C (99.4 F)   TempSrc: Temporal   Weight: 129 kg (284 lb 6.4 oz)   Height: 1.73 m (5' 8.11")     Body mass index is 43.1 kg/(m^2).      General: well-appearing Caucasian female, in NAD  Eyes:. PERRLA. EOMI. Conjunctiva pink, without swelling or exudate. Lids normal. Sclera anicteric  ENT: Moist mucous membranes.    Lungs: Normal resp effort. CTAB.  No crackles or wheezes.  Cardiac: RRR, no M/R/G. No pedal edema. Pulses 2+ b/l.     Abd: S/obese, TTP throughout but most noticeable in epigastrum and b/l LQs.  Normoactive BS.  No masses or organomegaly.  No guarding or rebound tenderness. No CVA tenderness  Psych: AAOx3, normal affect and mood. Insight and judgement intact.           ASSESSMENT & PLAN  1. Irritable bowel syndrome, unspecified type  2. Nausea & vomiting  - promethazine (PHENERGAN) 12.5 MG tablet; Take 1 tablet (12.5 mg total) by mouth 4 times daily as needed  Dispense: 30 tablet; Refill: 0  - promethazine (PHENERGAN) injection 25 mg; Inject 1 mL (25 mg total) into the muscle once    - promethazine (PHENERGAN) 12.5 MG tablet; Take 1 tablet (12.5 mg total) by mouth 4 times daily as needed  Dispense: 30 tablet; Refill: 0  - promethazine (PHENERGAN) injection 25 mg; Inject 1 mL (25 mg total) into the muscle once    - clidinium-chlordiazePOXIDE (LIBRAX) 5-2.5 MG per capsule; Take 1 capsule by mouth 4 times daily (before meals and nightly)   Max daily  dose: 4 capsules  Dispense: 120 capsule; Refill:  0      Repeated imaging and labwork neg for acute GI process. Exam relatively benign today. No urinary complaints therefore urine culture is likely contaminant/normal vaginal flora. No pelvic LAD/fluid/vaginal discharge to suggest PID. Ddx includes possible IBS flare and anxiety/poor coping skills. In the past I have been concerned about opiate seeking behaviors (32yrago) therefore I hesitate to treat with PO opiates. Will try different antispasmodic (librax). Encouraged clonazepam dosing tonight in attempt to relieve anxiety. WIll f/u in AM. Given information on GGR to schedule FUV.        Follow-up: as scheduled      JJohny Drilling MD  [facility]  12/04/2015  3:58 PM        ______________________

## 2015-12-05 ENCOUNTER — Telehealth: Payer: Self-pay

## 2015-12-05 ENCOUNTER — Telehealth: Payer: Self-pay | Admitting: Primary Care

## 2015-12-05 DIAGNOSIS — K589 Irritable bowel syndrome without diarrhea: Secondary | ICD-10-CM

## 2015-12-05 MED ORDER — DIPHENOXYLATE-ATROPINE 2.5-0.025 MG PO TABS *I*
1.0000 | ORAL_TABLET | Freq: Four times a day (QID) | ORAL | 0 refills | Status: DC | PRN
Start: 2015-12-05 — End: 2016-09-13

## 2015-12-05 MED ORDER — ELUXADOLINE 75 MG PO TABS *A*
75.0000 mg | ORAL_TABLET | Freq: Two times a day (BID) | ORAL | 0 refills | Status: DC
Start: 2015-12-05 — End: 2015-12-11

## 2015-12-05 NOTE — Telephone Encounter (Signed)
Will trial viberzi  Please let pt know this is for diarrhea, therefore if suffering constipation in future she should stop this medicine.  GI can also give input on this medication. Was she able to make appt with GI group I gave to her?    [name], MD  [facility]  12/05/2015  4:04 PM

## 2015-12-05 NOTE — Telephone Encounter (Signed)
Received a RX request for clidinium-chlordiazempoxide (librax) 5-2.5 mg. Per request the patient's insurance does not cover the medication and is requesting a script for an alternative medication.

## 2015-12-05 NOTE — Telephone Encounter (Signed)
Per patient clonazepam is not helping her. Patient was also ask to call her insurance and find out what alterative medications are covered. Patient  Express understanding and will give writer a call back.

## 2015-12-05 NOTE — Telephone Encounter (Signed)
Please do PA. Pt already on bentyl and this is ineffective. Are there other alternatives?  Can you also call pt and f/u on how the pain responded to the clonazepam she has at home? Please explain to her the PA process too for her own knowledge.    [name], MD  [facility]  12/05/2015  2:44 PM

## 2015-12-05 NOTE — Telephone Encounter (Addendum)
[facility]  After Hours / On-Call Note    Date of Call: 12/05/15  Time of Call: 7:15 pm    PCP:  Dr. Laurance Flatten    Caller: self    Problem:  Patient seen yesterday in the office for a follow up to her admission for RLQ pain. She informs me she is already s/p appendectomy. Pain is unchanged - located RLQ. No emesis today. No fever or chills. Previously had dark BM but not today.    She is calling because neither Librax or Viberzi were covered by her insurance and were unaffordable to her out of pocket.       Disposition/Plan: Chart reviewed. Spoke to Dr. Laurance Flatten. iStop reviewed.    Rx for Lomotil sent to her pharmacy. Patient to follow up with office in AM    Mickel Baas E. [place]

## 2015-12-05 NOTE — Telephone Encounter (Signed)
Information given to patient, understanding expressed.

## 2015-12-05 NOTE — Telephone Encounter (Signed)
Pt returned call and stated that the insurance company gave her the following Alternatives, diphenoxylate, Atrome, Alosetron, Viberzi,. Please advise

## 2015-12-06 ENCOUNTER — Encounter: Payer: Self-pay | Admitting: Primary Care

## 2015-12-11 ENCOUNTER — Ambulatory Visit: Payer: Self-pay | Admitting: Psychiatry

## 2015-12-11 ENCOUNTER — Encounter: Payer: Self-pay | Admitting: Psychiatry

## 2015-12-11 VITALS — BP 121/73 | HR 72 | Resp 18 | Ht 68.11 in | Wt 289.0 lb

## 2015-12-11 DIAGNOSIS — F422 Mixed obsessional thoughts and acts: Secondary | ICD-10-CM

## 2015-12-11 DIAGNOSIS — Z5181 Encounter for therapeutic drug level monitoring: Secondary | ICD-10-CM

## 2015-12-11 DIAGNOSIS — F603 Borderline personality disorder: Secondary | ICD-10-CM

## 2015-12-11 DIAGNOSIS — F331 Major depressive disorder, recurrent, moderate: Secondary | ICD-10-CM
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
## 2015-12-14 ENCOUNTER — Other Ambulatory Visit: Payer: Self-pay

## 2015-12-14 NOTE — Progress Notes (Signed)
Call to patient to make her aware that her Trulicity from [facility] has arrived and is ready for pick up, spoke with SO, they will come this afternoon to get it. It will be in the fridge.

## 2015-12-18 ENCOUNTER — Telehealth: Payer: Self-pay

## 2015-12-18 ENCOUNTER — Ambulatory Visit: Payer: Self-pay

## 2015-12-18 NOTE — Telephone Encounter (Signed)
Patient rescheduled today's appointment due to not feeling well. Rescheduled on 9/6 at 1pm

## 2015-12-18 NOTE — Progress Notes (Signed)
[facility] MISSED/CANCELLED APPOINTMENT     Name: Kaylee Harris  MRN: [id number]   DOB: 12/20/1975    Date of Scheduled Service: 12/18/15    [name] cancelled today's appointment with less than 24 hours notice.  I have sent the patient a letter regarding today's missed appointment.    Additional Information:    Not applicable

## 2015-12-20 ENCOUNTER — Ambulatory Visit: Payer: Medicare (Managed Care) | Attending: Primary Care | Admitting: Registered Nurse

## 2015-12-20 ENCOUNTER — Other Ambulatory Visit: Payer: Self-pay | Admitting: Registered Nurse

## 2015-12-20 ENCOUNTER — Telehealth: Payer: Self-pay

## 2015-12-20 DIAGNOSIS — Z794 Long term (current) use of insulin: Secondary | ICD-10-CM

## 2015-12-20 DIAGNOSIS — E1165 Type 2 diabetes mellitus with hyperglycemia: Secondary | ICD-10-CM | POA: Insufficient documentation

## 2015-12-20 DIAGNOSIS — E119 Type 2 diabetes mellitus without complications: Secondary | ICD-10-CM

## 2015-12-20 DIAGNOSIS — IMO0002 Reserved for concepts with insufficient information to code with codable children: Secondary | ICD-10-CM

## 2015-12-20 NOTE — Telephone Encounter (Signed)
Patient recently in the office for appointment with diabetic specialist Gay Filler). Patient states that she was told by Chauncey Reading to notify her whenever she is out of her humalog. Patient received humalog (samples) which she is currently done to her last box. Writer was told to send a message to the pcp (per Collie Siad) regarding this matter. Please call patient at [phone] if there are any questions or concerns. Thank you!

## 2015-12-20 NOTE — Addendum Note (Signed)
Addended by: Grandville Silos on: 12/20/2015 05:11 PM     Modules accepted: Orders

## 2015-12-20 NOTE — Progress Notes (Signed)
Kaylee Harris 40 y.o. female was seen today for diabetes education regarding  uncontrolled type 2 diabetes mellitus. Last seen 08/09/15 for DSMT & new to  VGO insulin system + Trulicity injection teaching.  individual visits; new to Medicare as of 07/17/15 & initial year of DSMT.    Diabetes Meds: reviewed in eRecord; patient is taking as prescribed. Trulicity A999333 mg q week;Humalog vial in VGO insulin delivery system (1 click = 2 units); recall B & L 4-5 clicks; dinner 6 clicks.  TDD 62 units    Lab Results   Component Value Date    HA1C 7.4 (H) 09/22/2015    HA1C 9.0 (H) 07/10/2015    HA1C 9.3 (H) 06/05/2015    MALBR 0.31 09/11/2015    CREAT 0.92 11/30/2015    LDLC 73 06/05/2015     Self glucose monitoring:  FreeStyle lite BG meter 3 times daily. fbs 136 today ; recall  AC meals 87 - rare > 200. Denies hypo    BG today:162  mg/dl ; last meal this AM      Diet: 3 meals per day wt loss per pt ; less hungry  Current Activity : did not address ; using walker  Smoking: no Alcohol: no did not inquire     Self foot check: no   Eye Exam inquiry: no     Assessment:   Describes proper skill with filling of VGO device & skin placement on abdomen  Some burning with bolus likley related to bolus quantity  Partner present & both demonstrate proper understanding of basal / bolus system  2 units = 1 click  Will cont  4 - 6 clicks (AC B & L);  Largest meal dinner, 6 clicks  Instructed correction dosing if bG 150 or >; 1 to 30 correction factor  Will bolus for snacks; 1 to 7 gms   Due to mental health, feels cannot participate in group DM education & benefits individual       Coun/Edu:     BARRIERS THAT AFFECT LEARNING:  Mental health ; followed by psych  READINESS TO LEARN:  good  PREFERRED METHODS OF LEARNING:  reading, demonstration and visual tools  EDUCATION PROVIDED: insulin teaching, BG Goals, using ICR and CF to determine meal bolus and future CGM; risk reduction; skin care with VGO    Plan:     Patient Instructions   1.   Based on VGO 30 & bolus usage of about 32 units = total daily use is 62 unit/day    2.  Use correction of 1 click (2 units) when BG is over Q000111Q:     1 click= Q000111Q - 99991111   2 clicks = 0000000 - A999333   3 clicks = 123456 - A999333    3. Snacks (15 to 30 GMs)  1 click     4.  Check labs prior to Dr Laurance Flatten fu visit 01/09/2016    DSMT SUPPORT PLAN:  Take advantage of your annual Medicare benefits for DSMT and MNT  Follow-up visit:  [id number] at [facility] location  Time spent counseling patient: 30 minutes    Medicare initial MD order in eRecord 07/17/15. Expires 07/16/16  Initial year DSMT for Medicare  DSMT  individual 1/2 hr;  8 individual hrs remaining    Kaipo Ardis C Anaston Koehn, RN CDE

## 2015-12-20 NOTE — Patient Instructions (Addendum)
1.  Based on VGO 30 & bolus usage of about 32 units = total daily use is 62 unit/day    2.  Use correction of 1 click (2 units) when BG is over Q000111Q:     1 click= Q000111Q - 99991111   2 clicks = 0000000 - A999333   3 clicks = 123456 - A999333    3. Snacks (15 to 30 GMs)  1 click     4.  Check labs prior to Dr Laurance Flatten fu visit 01/09/2016

## 2015-12-20 NOTE — Telephone Encounter (Signed)
Pt previously rec'd humalog from [employer] [facility] (pharmacy benefit program). Please call [phone] to see if a shipment of humalog is coming. She rec'd her trulicity from them last week and typically it comes around the same time    [name], MD  [facility]  12/20/2015  5:15 PM

## 2015-12-21 NOTE — Telephone Encounter (Signed)
Please f/u with pt to see if she has enough humalog to last her 10-14d?    [name], MD  [facility]  12/21/2015  3:23 PM

## 2015-12-21 NOTE — Telephone Encounter (Signed)
Please see message below

## 2015-12-21 NOTE — Telephone Encounter (Signed)
Called [facility] and requested a refill for pt's Humalog and the representative Tyrell Antonio stated that they will ship this out to [place] and it should be here in 10-14 business days.

## 2015-12-21 NOTE — Telephone Encounter (Signed)
Pt stated she does have enough humalog to last 10-14 days.

## 2015-12-25 ENCOUNTER — Other Ambulatory Visit: Payer: Self-pay | Admitting: Psychiatry

## 2015-12-26 NOTE — Telephone Encounter (Signed)
Patients Humalog arrived by mail today. Patient notified she can come pick it up. Patient stated she will come by and get it.

## 2015-12-27 ENCOUNTER — Other Ambulatory Visit: Payer: Self-pay | Admitting: Gastroenterology

## 2015-12-27 ENCOUNTER — Other Ambulatory Visit
Admission: RE | Admit: 2015-12-27 | Discharge: 2015-12-27 | Disposition: A | Discharge status: Home / Self Care | Payer: Self-pay | Source: Ambulatory Visit

## 2015-12-27 DIAGNOSIS — E119 Type 2 diabetes mellitus without complications: Secondary | ICD-10-CM

## 2015-12-27 DIAGNOSIS — Z794 Long term (current) use of insulin: Secondary | ICD-10-CM

## 2015-12-27 DIAGNOSIS — Z5181 Encounter for therapeutic drug level monitoring: Secondary | ICD-10-CM

## 2015-12-27 LAB — LIPID PANEL
Chol/HDL Ratio: 4.7
Cholesterol: 154 mg/dL
HDL: 33 mg/dL
LDL Calculated: 60 mg/dL
Non HDL Cholesterol: 121 mg/dL
Triglycerides: 304 mg/dL — AB

## 2015-12-27 LAB — MULTIPLE ORDERING DOCS

## 2015-12-27 NOTE — Consults (Signed)
[facility]  Consult Note                                  Primary Care Physician:   [name], MD   Patient Name: Kaylee Harris  12/27/2015    Consult reason: Diarrhea and right sided abdominal pain    Subjective:      Chart reviewed. History obtained from patient and chart reviewed.    Kaylee Harris is 40 y.o. year old female with Hx of POTS syndrome and uses a walker, Hx of Diverticulitis in 2015 s/p left hemicolectomy by Dr. [name] that presents with right sided abdominal pain and diarrhea for about 1 month.  She is having diarrhea 2-3 times per day and using anti-diarrheal agents. Eating makes the diarrhea worse. Pain is worse when she has a bowel movement. Nothing else makes it better or worse.  Denies blood in the stool but had had "coffee grounds" stool.    + right sided abdominal- constant pain but varies in intensity- sharp stabbing pain. Pain doesn't radiate. Nothing else makes it better or worse.     She went to the ED twice in July 2017 but had negative blood in the stool and normal CT Scans (See below).  She also had an ultrasound.   Denies foreign travel, recent antibiotics, or well water.    Colonoscopy in 2014 while living in [place]- diverticulosis and polyps.     She had an upper endoscopy in 2009 and was normal.       Medications:     Home Medications:  Prior to Admission medications    Medication Sig Start Date End Date Taking? Authorizing Provider   diphenoxylate-atropine (LOMOTIL) 2.5-0.025 MG per tablet Take 1 tablet by mouth 4 times daily as needed for Diarrhea   Max daily dose: 4 tablets 12/05/15   Gift, Sherlon Handing, DO   promethazine (PHENERGAN) 12.5 MG tablet Take 1 tablet (12.5 mg total) by mouth 4 times daily as needed 12/04/15   [name], MD   dicyclomine (BENTYL) 20 MG tablet Take 1 tablet (20 mg total) by mouth 4 times daily (before meals and nightly) 11/09/15   [name], MD   topiramate (TOPAMAX) 100 MG tablet Take 1 tablet (100 mg total) by mouth 2  times daily 11/08/15   [name], MD   SUMAtriptan (IMITREX) 50 MG tablet Take 1 tablet (50 mg total) by mouth as needed for Migraine   Take at onset of headache. May repeat once in 2 hours. 11/08/15   [name], MD   lamoTRIgine (LAMICTAL) 150 MG tablet Take 1 tablet (150 mg total) by mouth daily 11/08/15   Corigliano, Suann Larry, NP   mirtazapine (REMERON) 15 MG tablet Take 1 tablet (15 mg total) by mouth nightly 11/08/15   Corigliano, Suann Larry, NP   pantoprazole (PROTONIX) 40 MG EC tablet Take 1 tablet (40 mg total) by mouth daily   SWALLOW WHOLE. DO NOT CRUSH, BREAK, OR CHEW. 11/06/15   [name], MD   busPIRone (BUSPAR) 30 MG tablet Take 1 tablet (30 mg total) by mouth 2 times daily 10/25/15   Corigliano, Suann Larry, NP   atorvastatin (LIPITOR) 40 MG tablet Take 1 tablet (40 mg total) by mouth daily (with dinner) 10/25/15   [name], MD   ARIPiprazole (ABILIFY) 15 MG tablet Take 1/2 tablet daily in the morning. 10/12/15   Corigliano, [name]  D, NP   Insulin Disposable Pump (V-GO 30) KIT Use daily; 30ct/month. 10/11/15   [name], MD   insulin lispro (HUMALOG) 100 UNIT/ML injection vial For VGO: Dinner 37J (6 clicks) ; B & L 4- 6 units. BG 100 - 150 8u (4clk), 151 - 200 10U (5clk), >201 12U ( 6 ck). MDD 72U (36 clk) 09/13/15   [name], MD   TRULICITY 6.96 VE/9.3YB SOPN INJECT 0.75 MG INTO THE SKIN ONCE A WEEK 08/31/15   [name], MD   DULoxetine (CYMBALTA) 60 MG capsule Take 1 capsule (60 mg total) by mouth daily 04/25/15   Corigliano, Suann Larry, NP   fluticasone (FLONASE) 50 MCG/ACT nasal spray 1 spray by Nasal route daily 04/18/15   Sheliah Mends, MD   midodrine (PROAMATINE) 10 MG tablet Take 10 mg by mouth 3 times daily 02/16/15   [provider]   VENTOLIN HFA 108 (90 BASE) MCG/ACT inhaler Inhale 1-2 puffs into the lungs every 4-6 hours as needed for Wheezing 12/12/14   [name], MD   clonazePAM (KLONOPIN) 0.5 MG tablet Take 1 tablet (0.5 mg total) by mouth 2 times daily as needed   Max  daily dose: 1 mg 11/30/14   Corigliano, Suann Larry, NP   FREESTYLE LITE test strip Use four times daily as directed for 250.02 11/21/14   [name], MD   blood glucose monitor system Brand: cheapest brand available per her insurance.  Use as directed. 11/15/14   Woodward Ku, NP   lancets Brand Free Style Lite; Use 2 times per day as directed for blood glucose testing. 11/15/14   Woodward Ku, NP   atenolol (TENORMIN) 25 MG tablet Take 25 mg by mouth daily    [provider]   Alcohol Swabs (ALCOHOL WIPES) PADS Use BID for BG check 10/31/14   [name], MD   famciclovir [facility]) 500 MG tablet Take 1 tablet (500 mg total) by mouth 3 times daily as needed 10/11/14   [name], MD   cetirizine (ZYRTEC) 10 MG tablet Take 10 mg by mouth daily    [provider]   levonorgestrel (MIRENA) 20 MCG/24HR IUD 1 each by Intrauterine route once    [provider]   Non-System Medication The above patient is followed in our clinic and cannot resume work permanently. 02/15/13   Lance Bosch, NP       Family history:     Family History   Problem Relation Age of Onset    Hypertension Father     Diabetes Father     Kidney Disease Father     Elevated lipids Father     Heart attack Father 79    Other Father      PVD    Hypertension Mother     Elevated lipids Mother     Heart attack Mother 61    Diabetes Mother     Eczema Mother     Psoriasis Mother     Hypertension Brother     Heart Disease Brother 5     prinzmetal's angina    Heart Disease Sister      currently having work up    [place] Sister     Hypertension Sister     Breast cancer Maternal Grandmother     Stroke Other        PMH:     Past Medical History:   Diagnosis Date    Abscess of abdominal wall 01/18/2014  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 Lower abdomen with cellulitis changes, wound probed and purulent material expressed.  Had PICC line for "multiple infiltrations" of what? D/c-ed home  on 10d of Augmentin 9/11.      Anginal pain     Anxiety     Arthritis     Asthma     Complication of anesthesia     Depression     Diabetes mellitus     Previously on SU and metformin, now diet controlled    Diverticulitis 09/2010    Dysfunctional uterine bleeding     Fibromyalgia     GERD (gastroesophageal reflux disease)     Hyperlipidemia     Migraine     Neuromuscular disorder     Sebaceous cyst of breast     right axilla    Varicella     Past Surgical History:   Procedure Laterality Date    APPENDECTOMY  2016    arthroscopic shoulder surgery Right 2010    Related to lifting    CARDIAC CATHETERIZATION  09/2011    negative    DILATION AND CURETTAGE OF UTERUS  2005, 2007    x2 for menorraghia    LEFT COLECTOMY  01/03/14    Dr Tresa Res    loop recorder  Feb 03 2014    SMALL INTESTINE SURGERY      TONSILLECTOMY         Social history:     Social History   Substance Use Topics    Smoking status: Former Smoker     Packs/day: 0.50     Years: 3.00     Types: Cigarettes    Smokeless tobacco: Never Used    Alcohol use 0.0 oz/week     0 Standard drinks or equivalent per week      Comment: <1 weekly       Allergies:     Allergies   Allergen Reactions    Morphine Anaphylaxis    Toradol [Ketorolac Tromethamine] Anaphylaxis     Notes feeling tightness in her throat and full body itching after last dose a month ago. Also notes erythema tracking along her vein after injection. This is not reflected by documentation.  Tolerates Naproxen.    Trazodone Anaphylaxis    Morphine Other (See Comments)     unknown    Toradol [Ketorolac Tromethamine] Other (See Comments)     unknown    Trazodone Other (See Comments)     unknown       Review of Systems:     General:  No malaise, fatigue, fever, chills, sweats.  HEENT:  No tinnitus, coryza, diplopia, dysgeusia.  Cardiovascular:  No chest pain, palpitations.  Respiratory:  No dyspnea, cough.  Musculoskeletal:  No myalgias.  GI:  See above.  GU:  No dysuria,  hematuria.  Skin:  No rash, pruritus, jaundice.  Neuro:   No focal numbness, weakness, tremor.  Psychiatric:  No somnolence, confusion, dysphoria.  Endocrine:  No heat or cold intolerance.  Heme/Lymph:  No easy bruising/bleeding.  No concerning lumps.  Allergy/Rheum:  No arthralgias/arthritis.  No rash/hives.  All other systems negative.    PHYSICAL EXAM:  Temp Readings from Last 3 Encounters:   12/04/15 37.4 C (99.4 F) (Temporal)   11/22/15 36.5 C (97.7 F)   11/14/15 36.8 C (98.3 F) (Temporal)     BP Readings from Last 3 Encounters:   12/04/15 114/82   11/22/15 114/76   11/14/15 126/86  Pulse Readings from Last 3 Encounters:   12/04/15 76   11/22/15 88   11/14/15 80         Vitals:   BP: 128/88 Ht: 68" 5'8" Wt: 295lb BMI: 44.8 Pulse: 76    Wt Readings from Last 3 Encounters:   12/04/15 129 kg (284 lb 6.4 oz)   11/22/15 131.6 kg (290 lb 3.2 oz)   11/14/15 132.3 kg (291 lb 9.6 oz)     General appearance: alert, appears stated age and cooperative  Head: Normocephalic, without obvious abnormality, atraumatic  Eyes: conjunctivae/corneas clear, anicteric  Neck: supple, symmetrical, trachea midline  Lungs: clear to auscultation bilaterally  Heart: regular rate and rhythm, S1, S2 normal, no murmur, click, rub or gallop  Abdomen: soft, non-tender; bowel sounds normal; no masses,  no organomegaly  Extremities: extremities normal, atraumatic, no cyanosis or edema  Neurologic: Grossly normal  Rectal: Deferred at this time  MSK: Grossly Normal, moves all 4 extremities  Lymph: No appreciable lymphadenopathy    LAB DATA:     Lab Results: Lab Results   Component Value Date    WBC 10.6 (H) 11/30/2015    HGB 11.2 (L) 11/30/2015    HCT 35 11/30/2015    MCV 85 11/30/2015    PLT 298 11/30/2015          Lab results: 11/30/15  0614 10/17/15  1412   Sodium 140 139   Potassium 4.1 4.2   Chloride 109 102   CO2 25 19*   UN 9 10   Creatinine 0.92 0.88   GFR,Caucasian  --  82   GFR,Black  --  94   Glucose 126* 137*   Calcium 8.9 9.7         Lab Results   Component Value Date    ALT 42 (H) 06/05/2015    AST 32 06/05/2015     Lab Results   Component Value Date    TB 0.3 06/05/2015      Lab Results   Component Value Date    ALK 118 (H) 06/05/2015      Lab Results   Component Value Date    LIP 27 10/08/2014    AMY 30 10/08/2014      No results for input(s): PTI, INR in the last 8760 hours.         IMAGING:   CT A/P with contrast in July 2017:   CT ABDOMEN FINDINGS:The liver, gallbladder, pancreas, spleen, adrenal glands, kidneys within normal limits. Abdominal aorta normal in caliber. No significant retroperitoneal lymphadenopathy. GI tract demonstrates colonic diverticulosis without  diverticulitis. No obstruction. Status post appendectomy. Nodularity of the right rectus again noted, not significantly changed.    CT PELVIS FINDINGS:Bladder partially distended, unremarkable. Uterus within normal limits. Physiologic left adnexal cysts. No free fluid in the pelvis or significant pelvic lymphadenopathy.    Visualized lung bases within normal limits. No aggressive osseous lesion.    IMPRESSION:No acute abnormality. Diverticulosis without diverticulitis.    ASSESSMENT:     40 y.o. year old female with Hx of POTS syndrome and uses a walker, Hx of Diverticulitis in 2015 s/p left hemicolectomy by Dr. [name] that presents with right sided abdominal pain and diarrhea for about 1 month.  Pain in Right side is constant so sounds neuropathic in nature.  We will do testing for infections and a colonoscopy to test for microscopic colitis. In the meantime, she needs to take a fiber supplement as already had surgery for diverticulosis.  PLAN:   1. Colonoscopy next available. The risk, benefits and alternatives were discussed including the risk of bleeding, infection, over sedation and perforation.  The patient understands and wishes to proceed.  2. Check labs   3. Lactose intolerant- avoid dairy products or use lactose free products  4. Citrucel 2 pills  daily  5. Goal is 5 servings daily of fruits, vegetables and salad.  6. Ice the area daily while watching TV   Lab Orders     :  Bacterial GI PCR Panel                    C Difficile Toxins A&B Eia                    Parasite GI PCR Panel  7. Follow up post-procedure- if not better, we can consider upper endoscopy.        Thank you for allowing me to participate in this patient's care.  Please do not hesistate to contact us with any questions or concerns.    Office #: [phone] or Office Fax: [phone]     Electronically Signed by:  Kelly Splinter, MD  Note created: 12/27/2015  at: 1:15 PM   Cell: [phone]

## 2015-12-28 ENCOUNTER — Telehealth: Payer: Self-pay | Admitting: Primary Care

## 2015-12-28 DIAGNOSIS — IMO0002 Reserved for concepts with insufficient information to code with codable children: Secondary | ICD-10-CM

## 2015-12-28 LAB — HEMOGLOBIN A1C: Hemoglobin A1C: 7.3 % — ABNORMAL HIGH (ref 4.0–6.0)

## 2015-12-28 MED ORDER — INSULIN SYRINGE-NEEDLE U-100 31G X 5/16" 0.3 ML MISC *A*
0 refills | Status: DC
Start: 2015-12-28 — End: 2018-02-02

## 2015-12-28 NOTE — Telephone Encounter (Signed)
[facility] / PRACTICE CALL NOTE    Date and Time of Call: 12/28/2015 at 9:51 PM     PCP:  [name], MD     Problem: insulin pump is not working properly    Uses humalog 100 units/mL to fill pump    8 units with BF  8 unit with Lunch   10 units with Dinner     No sliding scale     BG levels today were 123, 136 and 146 before meals     Should have new pump by Saturday night     Impression and Plan:   40 yo type 2 diabetic patient who's insulin pump is not working properly  Unable to obtain working pump until Saturday evening    Will write for insulin syringes to be used with her humalog vials at home    Reviewed with patient that humalog is 100 units/1 mL thus she would need to inject herself with     0.08 mL (8 units) with BF and Lunch and 0.1 mL (10 units) with dinner.     She will call office if she has difficulty obtaining syringes.      The patient was instructed to call back if patient is having worsening symptoms or lack of improvement of current symptoms with the above treatment, and patient expressed understanding.    Nelta Numbers, MD

## 2015-12-29 NOTE — Telephone Encounter (Signed)
Called pt and offered a box of VGo pods.  She declined stating she already picked up her insulin needles and would not be able to get to [facility] today to pick up pods.  She states she will be using her Humalog injections until tomorrow when she is supposed to get her VGo pods, then will restart with VGo.  She tells me she does not have any lantus at home right now.  She is aware her bGs may run higher with no basal dose.  Advised to call PCP office if bGs are running >250.  Eartha Inch, RN

## 2016-01-02 NOTE — Telephone Encounter (Signed)
Error

## 2016-01-03 ENCOUNTER — Other Ambulatory Visit: Payer: Self-pay

## 2016-01-03 MED ORDER — BUSPIRONE HCL 30 MG PO TABS *I*
30.0000 mg | ORAL_TABLET | Freq: Two times a day (BID) | ORAL | 2 refills | Status: DC
Start: 2016-01-03 — End: 2016-01-18

## 2016-01-03 MED ORDER — DICYCLOMINE HCL 20 MG PO TABS *I*
20.0000 mg | ORAL_TABLET | Freq: Four times a day (QID) | ORAL | 5 refills | Status: DC
Start: 2016-01-03 — End: 2016-04-01

## 2016-01-04 ENCOUNTER — Emergency Department
Admission: EM | Admit: 2016-01-04 | Discharge: 2016-01-05 | Disposition: A | Discharge status: Home / Self Care | Payer: Medicare (Managed Care) | Source: Ambulatory Visit | Attending: Emergency Medicine | Admitting: Emergency Medicine

## 2016-01-04 DIAGNOSIS — R1011 Right upper quadrant pain: Secondary | ICD-10-CM | POA: Insufficient documentation

## 2016-01-04 DIAGNOSIS — R109 Unspecified abdominal pain: Secondary | ICD-10-CM

## 2016-01-04 LAB — BASIC METABOLIC PANEL
Anion Gap: 15 (ref 7–16)
CO2: 23 mmol/L (ref 20–28)
Calcium: 9.7 mg/dL (ref 8.8–10.2)
Chloride: 100 mmol/L (ref 96–108)
Creatinine: 0.91 mg/dL (ref 0.51–0.95)
GFR,Black: 90 *
GFR,Caucasian: 78 *
Glucose: 98 mg/dL (ref 60–99)
Lab: 13 mg/dL (ref 6–20)
Potassium: 3.8 mmol/L (ref 3.3–5.1)
Sodium: 138 mmol/L (ref 133–145)

## 2016-01-04 LAB — RUQ PANEL (ED ONLY)
ALT: 18 U/L (ref 0–35)
AST: 17 U/L (ref 0–35)
Albumin: 4.7 g/dL (ref 3.5–5.2)
Alk Phos: 122 U/L — ABNORMAL HIGH (ref 35–105)
Amylase: 46 U/L (ref 28–100)
Bilirubin,Direct: <0.2 mg/dL (ref 0.0–0.3)
Bilirubin,Total: 0.4 mg/dL (ref 0.0–1.2)
Globulin: 2.8 g/dL (ref 2.7–4.3)
Lipase: 39 U/L (ref 13–60)
Total Protein: 7.5 g/dL (ref 6.3–7.7)

## 2016-01-04 LAB — CBC AND DIFFERENTIAL
Baso # K/uL: 0.1 10*3/uL (ref 0.0–0.1)
Basophil %: 0.5 %
Eos # K/uL: 0.1 10*3/uL (ref 0.0–0.4)
Eosinophil %: 0.8 %
Hematocrit: 39 % (ref 34–45)
Hemoglobin: 12.7 g/dL (ref 11.2–15.7)
IMM Granulocytes #: 0.1 10*3/uL (ref 0.0–0.1)
IMM Granulocytes: 0.6 %
Lymph # K/uL: 3 10*3/uL (ref 1.2–3.7)
Lymphocyte %: 20.6 %
MCH: 27 pg (ref 26–32)
MCHC: 32 g/dL (ref 32–36)
MCV: 84 fL (ref 79–95)
Mono # K/uL: 0.7 10*3/uL (ref 0.2–0.9)
Monocyte %: 4.6 %
Neut # K/uL: 10.5 10*3/uL — ABNORMAL HIGH (ref 1.6–6.1)
Nucl RBC # K/uL: 0 10*3/uL (ref 0.0–0.0)
Nucl RBC %: 0 /100 WBC (ref 0.0–0.2)
Platelets: 409 10*3/uL — ABNORMAL HIGH (ref 160–370)
RBC: 4.7 MIL/uL (ref 3.9–5.2)
RDW: 14.2 % (ref 11.7–14.4)
Seg Neut %: 72.9 %
WBC: 14.4 10*3/uL — ABNORMAL HIGH (ref 4.0–10.0)

## 2016-01-04 LAB — POCT OCCULT BLOOD STOOL: Occult blood stool, POCT: NEGATIVE

## 2016-01-04 MED ORDER — ONDANSETRON HCL 2 MG/ML IV SOLN *I*
4.0000 mg | Freq: Once | INTRAMUSCULAR | Status: AC
Start: 2016-01-04 — End: 2016-01-04
  Administered 2016-01-04: 4 mg via INTRAVENOUS
  Filled 2016-01-04: qty 2

## 2016-01-04 MED ORDER — HYDROMORPHONE HCL PF 1 MG/ML IJ SOLN *WRAPPED*
0.5000 mg | Freq: Once | INTRAMUSCULAR | Status: AC
Start: 2016-01-04 — End: 2016-01-04
  Administered 2016-01-04: 0.5 mg via INTRAVENOUS
  Filled 2016-01-04: qty 1

## 2016-01-04 MED ORDER — SODIUM CHLORIDE 0.9 % IV BOLUS *I*
1000.0000 mL | Freq: Once | Status: AC
Start: 2016-01-04 — End: 2016-01-05
  Administered 2016-01-04: 1000 mL via INTRAVENOUS

## 2016-01-04 NOTE — ED Notes (Signed)
Patient presents to ED with c/o of RUQ pain, nausea and diarrhea

## 2016-01-04 NOTE — ED Triage Notes (Signed)
Pt states she has been having r sided abd pain for months and called her gastro md because the pain had become worse today. Sent to ed for eval.. Pt reports having small amt of bright red blood in stool today x2       Triage Note   Lanney Gins, RN

## 2016-01-05 ENCOUNTER — Ambulatory Visit: Payer: Self-pay | Admitting: Primary Care

## 2016-01-05 ENCOUNTER — Encounter: Payer: Self-pay | Admitting: Emergency Medicine

## 2016-01-05 ENCOUNTER — Encounter: Payer: Self-pay | Admitting: Primary Care

## 2016-01-05 VITALS — BP 118/86 | HR 84 | Temp 97.6°F | Ht 68.11 in | Wt 285.0 lb

## 2016-01-05 DIAGNOSIS — R1011 Right upper quadrant pain: Secondary | ICD-10-CM

## 2016-01-05 LAB — POCT URINALYSIS DIPSTICK
Bilirubin,Ur: NEGATIVE
Glucose,UA POCT: NORMAL
Ketones,UA POCT: NEGATIVE
Lot #: 16608901
Nitrite,UA POCT: NEGATIVE
PH,UA POCT: 6 (ref 5–8)
Specific gravity,UA POCT: 1.02 (ref 1.002–1.03)
Urobilinogen,UA: NORMAL

## 2016-01-05 MED ORDER — HYDROMORPHONE HCL PF 1 MG/ML IJ SOLN *WRAPPED*
0.5000 mg | Freq: Once | INTRAMUSCULAR | Status: AC
Start: 2016-01-05 — End: 2016-01-05
  Administered 2016-01-05: 0.5 mg via INTRAVENOUS
  Filled 2016-01-05: qty 1

## 2016-01-05 NOTE — Progress Notes (Signed)
[facility] - Outpatient Progress Note    SUBJECTIVE    Kaylee Harris  is a 40 y.o. female  here for an acute visit today:    Today we discussed:    1. Abd pain: Was seen in ED yesterday for R sided abd pain that has been going on for several months now.  Reports that pain feels like it has been increasing and this is what took her to the ED yesterday.  Pain is "unbearable."  It is constant, located in RUQ with radiation around to R mid-back.  Feels dull.  Worse with eating or laying on her R side. BMs have been loose since pain began, and pain is worse prior to BMs. Having a BM does not relieve any pain. No fevers.  She did have nausea, but phenergan has helped with that.  Weight has been stable.   For pain she is currently taking tylenol every 5 hours - helped slightly yesterday.   She has tried ibuprofen and naproxen - both give her rashes.   Tramadol was helpful in the past.  Opiates help with the pain.    Was seen 8/23 with GI - Dr. Verner Chol, has upcoming colonoscopy    Reviewed ED workup:      [place] abd (01/04/2016)    IMPRESSION:      Hepatomegaly, without acute sonographic findings to explain patient's    right upper quadrant abdominal pain.           Lab results: 01/04/16  2308   WBC 14.4*   Hemoglobin 12.7   Hematocrit 39   RBC 4.7   Platelets 409*         Lab results: 01/04/16  2308   Sodium 138   Potassium 3.8   Chloride 100   CO2 23   UN 13   Creatinine 0.91   GFR,Caucasian 78   GFR,Black 90   Glucose 98   Calcium 9.7   Total Protein 7.5   Albumin 4.7   ALT 18   AST 17   Alk Phos 122*   Bilirubin,Total 0.4     She is quite persistent about wanting something for pain.          The patient's medications were reviewed and reconciled with the patient; and No changes were made.  The patient's allergies were reviewed and confirmed with the patient.      OBJECTIVE    BP 118/86 (BP Location: Right arm, Patient Position: Sitting, Cuff Size: large adult)   Pulse 84   Temp 36.4 C (97.6 F) (Temporal)     Ht 1.73 m (5' 8.11")   Wt 129.3 kg (285 lb)   BMI 43.19 kg/m2   Gen: appears well, NAD, appears more comfortable than she describes  HEENT - anicteric, normal oropharynx, MMM  Lungs: clear to auscultation bilaterally with no rales, wheezes, or rhonchi  CV: RRR, no murmur, rub or gallop  Abdomen - Soft/nondistended, no obvious hernia present. RUQ tenderness with palpation, no rebound/guarding, No HSM palpable        ASSESSMENT & PLAN    1. Abdominal pain, right upper quadrant  40 yo F with multiple co-morbidities with ongoing RUQ pain for several months - no red flags, workup thus far including imaging - ultrasound and CT, GI referral, labs have not revealed etiology.  Normal UA today.  She does have a visit coming up for colonoscopy.  She is perseverating on treatment with pain medication.  We reviewed that  she should continue the tylenol - reviewed appropriate dosing.  She should continue Bentyl.  Opioid pain medication is not indicated.    Recommend trial of ginger, ginger ale, consideration of acupuncture if pain is becoming chronic.    POCT Urinalysis Dipstick            Pt agrees with and understands plan.    Follow up PRN if symptoms not improving     Dr. [facility]          Attestations:  Documented by Army Melia acting as a scribe for Dr. Patriciaann Clan, MD   01/05/16 5:32 PM     I, Dr. Patriciaann Clan, MD, personally performed the services described in this documentation, as scribed by Elby Beck in my presence, and it is accurate and complete.  Patriciaann Clan, MD  [place]

## 2016-01-05 NOTE — Discharge Instructions (Signed)
Continue to take Tylenol at home     Follow-up with your GI doctor and PCP   Return to the ED with worsening symptoms or concerns

## 2016-01-05 NOTE — ED Provider Notes (Signed)
History     Chief Complaint   Patient presents with    Abdominal Pain     HPI Comments: Patient is a 40 year old female with a history of anxiety, arthritis, depression, diabetes, diverticulitis, fibromyalgia, GERD, hyperlipidemia who presents to the emergency Department with complaints of abdominal pain.  Patient states that she's had right-sided abdominal pain for more than 2 months.  Patient reports diarrhea, reports that she's had blood in her diarrhea today.  Denies any nausea or vomiting.  Denies any urinary symptoms or vaginal discharge.  Denies any fevers or chills.  Patient states that she spoke with her GI doctor who scheduled her for colonoscopy but reports that she cannot get in until November.  Patient states that she called her doctor tonight result come to the emergency department.      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 Lower abdomen with cellulitis changes, wound probed and purulent material expressed.  Had PICC line for "multiple infiltrations" of what? D/c-ed home on 10d of Augmentin 9/11.      Anginal pain     Anxiety     Arthritis     Asthma     Complication of anesthesia     Depression     Diabetes mellitus     Previously on SU and metformin, now diet controlled    Diverticulitis 09/2010    Dysfunctional uterine bleeding     Fibromyalgia     GERD (gastroesophageal reflux disease)     Hyperlipidemia     Migraine     Neuromuscular disorder     Sebaceous cyst of breast     right axilla    Varicella         Past Surgical History:   Procedure Laterality Date    APPENDECTOMY  2016    arthroscopic shoulder surgery Right 2010    Related to lifting    CARDIAC CATHETERIZATION  09/2011    negative    DILATION AND CURETTAGE OF UTERUS  2005, 2007    x2 for menorraghia    LEFT COLECTOMY  01/03/14     Dr Tresa Res    loop recorder  Feb 03 2014    SMALL INTESTINE SURGERY      TONSILLECTOMY       Family History   Problem Relation Age of Onset    Hypertension Father     Diabetes Father     Kidney Disease Father     Elevated lipids Father     Heart attack Father 50    Other Father      PVD    Hypertension Mother     Elevated lipids Mother     Heart attack Mother 45    Diabetes Mother     Eczema Mother     Psoriasis Mother     Hypertension Brother     Heart Disease Brother 52     prinzmetal's angina    Heart Disease Sister      currently having work up    [place] Sister     Hypertension Sister     Breast cancer Maternal Grandmother     Stroke Other        Social History    reports that she has quit smoking. Her smoking use included  Cigarettes. She has a 1.50 pack-year smoking history. She has never used smokeless tobacco. She reports that she drinks alcohol. She reports that she currently engages in sexual activity and has had female partners. She reports that she does not use illicit drugs.    Living Situation     Questions Responses    Patient lives with Significant Other    Comment: Lives with Wife, [name]     Homeless No    Caregiver for other family member No    External Services Mental Health Services    Comment: [place]     Employment Disabled    Domestic Violence Risk No          Problem List     Patient Active Problem List   Diagnosis Code    DM (diabetes mellitus), type 2, uncontrolled E11.65    Migraine with aura G43.109    Asthma J45.909    Hyperlipemia E78.5    Fibromyalgia M79.7    Syncope and collapse- recurrent, working diagnosis vascular vs psychogenic. Non-arrythmic per Dr Lovena Le 10/2014 R55    Depression, major, recurrent, moderate F33.1    IUD (intrauterine device) in place Z97.5    Skin lesion of face L98.9    IBS (irritable bowel syndrome) K58.9    Anxiety F41.9    Mixed obsessional thoughts and acts F42.2    Carpal tunnel syndrome G56.00     Meralgia paresthetica of left side G57.12    No diabetic retinopathy in both eyes Z03.89    At risk for abuse of opiates Z91.89    History of repeated overdose Z91.5    Borderline personality disorder F60.3    Neck pain M54.2    Syncope R55    Fatty liver disease, nonalcoholic Q25.9       Review of Systems   Review of Systems   Constitutional: Negative for activity change, appetite change, chills, diaphoresis, fatigue, fever and unexpected weight change.   HENT: Negative.    Eyes: Negative.    Respiratory: Negative for apnea, cough, choking, chest tightness, shortness of breath, wheezing and stridor.    Cardiovascular: Negative for chest pain, palpitations and leg swelling.   Gastrointestinal: Positive for abdominal pain, blood in stool and diarrhea. Negative for abdominal distention, constipation, nausea and vomiting.   Endocrine: Negative.    Genitourinary: Negative for dysuria, flank pain, frequency, hematuria and urgency.   Musculoskeletal: Negative for arthralgias, back pain, gait problem, joint swelling, myalgias, neck pain and neck stiffness.   Skin: Negative for color change, pallor, rash and wound.   Allergic/Immunologic: Negative.    Neurological: Negative for dizziness, weakness, light-headedness and headaches.   Hematological: Negative.    Psychiatric/Behavioral: Negative for self-injury and suicidal ideas.   All other systems reviewed and are negative.      Physical Exam     ED Triage Vitals   BP Heart Rate Heart Rate (via Pulse Ox) Resp Temp Temp src SpO2 O2 Device O2 Flow Rate   01/04/16 1931 01/04/16 1931 -- 01/04/16 1931 01/04/16 1931 01/05/16 0212 01/04/16 1931 01/04/16 1931 --   142/81 79  16 35.9 C (96.6 F) TEMPORAL 99 % None (Room air)       Weight           01/04/16 1931           135.2 kg (298 lb)                    Physical Exam  Constitutional: She is oriented to person, place, and time. She appears well-developed. No distress.   HENT:   Head: Normocephalic.   Eyes: Conjunctivae  and EOM are normal. Pupils are equal, round, and reactive to light.   Neck: Normal range of motion. Neck supple.   Cardiovascular: Normal rate, regular rhythm and normal heart sounds.    Pulmonary/Chest: Effort normal and breath sounds normal. No respiratory distress. She has no wheezes. She has no rales. She exhibits no tenderness.   Abdominal: Soft. Bowel sounds are normal. She exhibits no distension. There is tenderness.   Abdomen soft, non-distended, RUQ tenderness    Genitourinary: Rectal exam shows guaiac negative stool.   Musculoskeletal: Normal range of motion. She exhibits no edema, tenderness or deformity.   Neurological: She is alert and oriented to person, place, and time.   Skin: Skin is warm and dry. No rash noted. She is not diaphoretic. No erythema. No pallor.   Psychiatric: She has a normal mood and affect. Her behavior is normal. Judgment and thought content normal.   Nursing note and vitals reviewed.      Medical Decision Making      Amount and/or Complexity of Data Reviewed  Clinical lab tests: ordered and reviewed  Tests in the radiology section of CPT: ordered and reviewed        Initial Evaluation:  ED First Provider Contact     Date/Time Event User Comments    01/04/16 2222 ED Provider First Contact Aleathea Pugmire Initial Face to Face Provider Contact          Patient seen by me on arrival date of 01/04/2016 at at time of arrival  7:28 PM.  Initial face to face evaluation time noted above may be discrepant due to patient acuity and delay in documentation.    Assessment:  40 y.o.female comes to the ED with complaint of abdominal pain     Differential Diagnosis includes acute on chronic pain, biliary colic, cholecystitis, cholelithiasis                         Plan:   CBC with diff  BMP  RUQ  Occult stool   RUQ ultrasound   IV hydration   Dilaudid       Labs showed:  Labs Reviewed   CBC AND DIFFERENTIAL - Abnormal; Notable for the following:        Result Value    WBC 14.4 (*)     Platelets  409 (*)     Neut # K/uL 10.5 (*)     All other components within normal limits   RUQ PANEL (ED ONLY) - Abnormal; Notable for the following:     Alk Phos 122 (*)     All other components within normal limits   BASIC METABOLIC PANEL   POCT OCCULT BLOOD STOOL     Ultrasound showed:  US Abdomen Limited Single Quad Or F/u Specify    Result Date: 01/05/2016  Exam Site: [facility] 01/05/2016 12:30 AM US ABDOMINAL SNGL QUAD OR F/U  ORDERING CLINICAL INFORMATION:  ERECORD: ruq abdominal pain. ADDITIONAL CLINICAL INFORMATION:  None.  COMPARISON:  CT abdomen/pelvis dated 10/01/2014.  TECHNIQUE:  Sonographic evaluation of the right upper quadrant was performed using grayscale ultrasound.  FINDINGS:   Liver:  18.7 cm craniocaudad.  Hepatomegaly, with coarse echotexture.  Gallbladder: No stones. No wall thickening. No pericholecystic fluid. Negative sonographic Murphy sign, however, the patient is premedicated.  Gallbladder Wall:  0.3 cm.  Common Bile Duct:  0.6 cm.  Right Kidney:  10.5 cm in length.  No Hydronephrosis.  Normal echogenicity.      IMPRESSION:  Hepatomegaly, without acute sonographic findings to explain patient's right upper quadrant abdominal pain.  END OF IMPRESSION   I have personally reviewed the image(s) and the resident's interpretation and agree with or edited the findings.      Reviewed CT abdomen and pelvis from 7/30 [facility]) which revealed:  No acute abnormalities   Diverticulosis without diverticulitis     Pt has been seen for similar pain in the past   Pt was hospitalized at [facility] for similar symptoms from 7/25-7/28 with no acute findings     Pt resting comfortably in room in no acute distress  Plan to discharge pt home with instructions to follow-up with her PCP and to return to the ED with worsening symptoms or concerns   Pt asking about pain medications for home  Pt educated to take tylenol for pain and to follow-up with her PCP and GI doctor for further pain control   Pt educated to  return to the ed with worsening symptoms or concerns   Pt is agreeable with this plan and understands return precautions           Denna Haggard, NP    Supervising physician Circe was immediately available       Denna Haggard, NP  01/05/16 [phone]

## 2016-01-08 ENCOUNTER — Other Ambulatory Visit
Admission: RE | Admit: 2016-01-08 | Discharge: 2016-01-08 | Disposition: A | Discharge status: Home / Self Care | Payer: Self-pay | Source: Ambulatory Visit | Admitting: Gastroenterology

## 2016-01-09 ENCOUNTER — Telehealth: Payer: Self-pay

## 2016-01-09 ENCOUNTER — Encounter: Payer: Self-pay | Admitting: Primary Care

## 2016-01-09 ENCOUNTER — Ambulatory Visit: Payer: Self-pay | Admitting: Primary Care

## 2016-01-09 VITALS — BP 124/88 | HR 84 | Temp 98.4°F | Ht 68.0 in | Wt 287.8 lb

## 2016-01-09 DIAGNOSIS — K580 Irritable bowel syndrome with diarrhea: Secondary | ICD-10-CM

## 2016-01-09 DIAGNOSIS — Z9189 Other specified personal risk factors, not elsewhere classified: Secondary | ICD-10-CM

## 2016-01-09 DIAGNOSIS — M6289 Other specified disorders of muscle: Secondary | ICD-10-CM

## 2016-01-09 DIAGNOSIS — K760 Fatty (change of) liver, not elsewhere classified: Secondary | ICD-10-CM

## 2016-01-09 DIAGNOSIS — E119 Type 2 diabetes mellitus without complications: Secondary | ICD-10-CM

## 2016-01-09 LAB — CLOSTRIDIUM DIFFICILE EIA: C difficile Toxins A and B EIA: 0

## 2016-01-09 MED ORDER — ALOSETRON HCL 0.5 MG PO TABS
0.5000 mg | ORAL_TABLET | Freq: Two times a day (BID) | ORAL | 1 refills | Status: DC
Start: 2016-01-09 — End: 2016-01-10

## 2016-01-09 MED ORDER — ALOSETRON HCL 0.5 MG PO TABS
0.5000 mg | ORAL_TABLET | Freq: Two times a day (BID) | ORAL | 1 refills | Status: DC
Start: 2016-01-09 — End: 2016-01-09

## 2016-01-09 NOTE — Telephone Encounter (Signed)
Please advise 

## 2016-01-09 NOTE — Progress Notes (Signed)
[facility]    SUBJECTIVE    Pt is here to discuss:    Chief Complaint   Patient presents with    Diabetes     1. F/u DM - Adherent with medications - VGO device. Does check BGs, shows me log with majority of fasting BGs in 110s-120s the last 2 weeks, some in the 130-140s. Highest level was on wife's birthday, 199 pre dinner meal. Denies polyuria, polydipsia or hypoglycemia sx. Denies peripheral paresthesias. Does check feet, denies foot lesions or concerns. Very happy with her control on the VGO device but now is in the donut hole of her medicare. VGO will cost her $200 monthly until January. Unable to afford this. Hasn't found any programs online to help  Lab Results   Component Value Date    HA1C 7.3 (H) 12/27/2015    HA1C 7.4 (H) 09/22/2015    HA1C 9.0 (H) 07/10/2015    MALBR 0.31 09/11/2015    CREAT 0.91 01/04/2016    LDLC 60 12/27/2015         2. Diarrhea and abd pain - RUQ pain. Seen again in hospital last week for uncontrollable pain. Pain persists despite bentyl and lomotil. Has colonoscopy not until November. Just submitted the stool studies requested by GI today. Wondering if this could be kidney stone. Reviewed CT from end of July that was neg for this, image taken at the time of pain sx as well. Only notable for R paramedian 1.2cm nodule within rectus sheath, ddx included dermoid tumor vs endometriosus implant. Stable compared to image 07/2014. Pt denies pain in this area. Diarrhea persists despite taking lomotil. apap doesn't touch the pain. Librax was not covered by insurance; viberzi too expensive.      PMH / Family Hx / Social Hx  Patient's medications, allergies, problem list, past medical, social histories were reviewed and notable for:    Current Outpatient Prescriptions   Medication Sig Note    busPIRone (BUSPAR) 30 MG tablet Take 1 tablet (30 mg total) by mouth 2 times daily     dicyclomine (BENTYL) 20 MG tablet Take 1 tablet (20 mg total) by mouth 4 times daily (before meals  and nightly)     insulin syringe-needle U-100 (BD ULTRAFINE) 31G X 5/16" 0.3 ML HALF-UNIT Use 3 times a day as instructed.     diphenoxylate-atropine (LOMOTIL) 2.5-0.025 MG per tablet Take 1 tablet by mouth 4 times daily as needed for Diarrhea   Max daily dose: 4 tablets     promethazine (PHENERGAN) 12.5 MG tablet Take 1 tablet (12.5 mg total) by mouth 4 times daily as needed     topiramate (TOPAMAX) 100 MG tablet Take 1 tablet (100 mg total) by mouth 2 times daily     SUMAtriptan (IMITREX) 50 MG tablet Take 1 tablet (50 mg total) by mouth as needed for Migraine   Take at onset of headache. May repeat once in 2 hours.     lamoTRIgine (LAMICTAL) 150 MG tablet Take 1 tablet (150 mg total) by mouth daily     mirtazapine (REMERON) 15 MG tablet Take 1 tablet (15 mg total) by mouth nightly     pantoprazole (PROTONIX) 40 MG EC tablet Take 1 tablet (40 mg total) by mouth daily   SWALLOW WHOLE. DO NOT CRUSH, BREAK, OR CHEW.     atorvastatin (LIPITOR) 40 MG tablet Take 1 tablet (40 mg total) by mouth daily (with dinner)     ARIPiprazole (ABILIFY) 15 MG tablet Take  1/2 tablet daily in the morning.     Insulin Disposable Pump (V-GO 30) KIT Use daily; 30ct/month.     insulin lispro (HUMALOG) 100 UNIT/ML injection vial For VGO: Dinner 15V (6 clicks) ; B & L 4- 6 units. BG 100 - 150 8u (4clk), 151 - 200 10U (5clk), >201 12U ( 6 ck). MDD 72U (36 clk)     TRULICITY 7.61 YW/7.3XT SOPN INJECT 0.75 MG INTO THE SKIN ONCE A WEEK     DULoxetine (CYMBALTA) 60 MG capsule Take 1 capsule (60 mg total) by mouth daily     fluticasone (FLONASE) 50 MCG/ACT nasal spray 1 spray by Nasal route daily     midodrine (PROAMATINE) 10 MG tablet Take 10 mg by mouth 3 times daily 02/27/2015: Received from: External Pharmacy    VENTOLIN HFA 108 (90 BASE) MCG/ACT inhaler Inhale 1-2 puffs into the lungs every 4-6 hours as needed for Wheezing     clonazePAM (KLONOPIN) 0.5 MG tablet Take 1 tablet (0.5 mg total) by mouth 2 times daily as needed    Max daily dose: 1 mg     FREESTYLE LITE test strip Use four times daily as directed for 250.02     blood glucose monitor system Brand: cheapest brand available per her insurance.  Use as directed.     lancets Brand Free Style Lite; Use 2 times per day as directed for blood glucose testing.     atenolol (TENORMIN) 25 MG tablet Take 25 mg by mouth daily     Alcohol Swabs (ALCOHOL WIPES) PADS Use BID for BG check     famciclovir (FAMVIR) 500 MG tablet Take 1 tablet (500 mg total) by mouth 3 times daily as needed     cetirizine (ZYRTEC) 10 MG tablet Take 10 mg by mouth daily     levonorgestrel (MIRENA) 20 MCG/24HR IUD 1 each by Intrauterine route once     Non-System Medication The above patient is followed in our clinic and cannot resume work permanently.        OBJECTIVE  Vitals:    01/09/16 1403   BP: 124/88   Pulse: 84   Temp: 36.9 C (98.4 F)   TempSrc: Temporal   Weight: 130.5 kg (287 lb 12.8 oz)   Height: 1.727 m (_0 )     Body mass index is 43.76 kg/(m^2).      General: well-appearing obese Caucasian female, pleasant & conversant, in NAD. Moves gradually to exam table.   Eyes:. PERRLA. EOMI. Conjunctiva pink, without swelling or exudate. Lids normal. Sclera anicteric  ENT: Moist mucous membranes.    Abd: Soft, obese. distractable exam, ttp at RUQ between epigastrum and lateral wall, but not painful when this area was palpated from the lateral aspect.  Normoactive BS. +nodular focus in R LQ, ~0GY from infraumbilical midline scar, ~6RS in size, minimally tender. No guarding or rebound tenderness.  Psych: AAOx3, labile affect and mood. Insight and judgement intact.           ASSESSMENT & PLAN  1. DM (diabetes mellitus), type 2, controlled  Reviewed excellent control of DM with VGO device. Unfortunately she cannot afford this starting next month. Will discuss with endo and PCN pharmacist if they are aware of any programs that pt may apply for. She was previously unsuccessful and uncontrolled on Lantus +  Humalog since 2016, and this is the best her a1c has been. I would highly advocate for this device given it's positive effect on her sugars and  a1c.     2. Irritable bowel syndrome with diarrhea  3. Fatty liver disease, nonalcoholic  4. At risk for abuse of opiates  5. Rectus muscle mass  Chronic diarrhea and RUQ pain (pain previously RLQ), uncontrolled despite antispasmodic bentyl and antidiarrheal lomotil. Will attempt alosetron which was told to [place] was preferable to librax when we tried to prescribe it in July. Pt suspects she will unlikely be able to afford it. Again reviewed my preference to avoid opiates, given constipating SE and previous concerns over narcotic seeking behavior. She feels this is the only thing that has helped with the pain however. I redirected and again stated my preference to try GI meds instead. Will have pharmacy review meds to determine which ones may be helpful at [facility] in terms of liver protection or avoidance of diarrheal SE. If colonoscopy neg, will consider w/u of the rectus sheath mass, although suspect this was incidental finding given its stability in size for >78yr   Follow-up: in January      Zachariah Pavek MOdell MD  [facility]  01/09/2016  2:19 PM        ______________________

## 2016-01-09 NOTE — Telephone Encounter (Signed)
Patient called stated that her new Rx will cost her 554.00 after insurance. She is asking for a different Rx that will help her pain and not cost so much. Pleae advise.

## 2016-01-09 NOTE — Telephone Encounter (Signed)
Pt already seen for ED f/u appt

## 2016-01-10 ENCOUNTER — Telehealth: Payer: Self-pay

## 2016-01-10 ENCOUNTER — Ambulatory Visit: Payer: Self-pay

## 2016-01-10 DIAGNOSIS — F4310 Post-traumatic stress disorder, unspecified: Secondary | ICD-10-CM

## 2016-01-10 DIAGNOSIS — F429 Obsessive-compulsive disorder, unspecified: Secondary | ICD-10-CM

## 2016-01-10 DIAGNOSIS — F3341 Major depressive disorder, recurrent, in partial remission: Secondary | ICD-10-CM

## 2016-01-10 DIAGNOSIS — F603 Borderline personality disorder: Secondary | ICD-10-CM

## 2016-01-10 LAB — BACTERIAL GI PCR PANEL: Bacterial GI PCR Panel (Salmonella, Shigella, Campy, Shiga toxin): 0

## 2016-01-10 MED ORDER — CHLORDIAZEPOXIDE-CLIDINIUM 5-2.5 MG PO CAPS *I*
1.0000 | ORAL_CAPSULE | Freq: Four times a day (QID) | ORAL | 0 refills | Status: DC
Start: 2016-01-10 — End: 2016-01-10

## 2016-01-10 MED ORDER — CHLORDIAZEPOXIDE-CLIDINIUM 5-2.5 MG PO CAPS *I*
1.0000 | ORAL_CAPSULE | Freq: Four times a day (QID) | ORAL | 0 refills | Status: DC
Start: 2016-01-10 — End: 2016-01-18

## 2016-01-10 NOTE — Telephone Encounter (Signed)
Says she is taking Vego but because of her fiances she cannot take it anymore.  Please call [phone]

## 2016-01-10 NOTE — Progress Notes (Addendum)
Behavioral Health Progress Note     LENGTH OF SESSION: 30 minutes    Contact Type:  Location: On Site    Face to Face     Problem(s)/Goals Addressed from Treatment Plan:    Problem 1:   Treatment Problem #1 01/10/2016   Patient Identified Problem Depression and anxiety       Goal for this problem:    Treatment Goal #1 01/10/2016   Patient Identified Goal Learn DBT and PST skills to manage affect       Progress towards this goal: Problem resolving. Comment: Patient reports an absence of SI.    Mental Status Exam:  APPEARANCE: Appears stated age, Well-groomed, Casual  ATTITUDE TOWARD INTERVIEWER: Cooperative  MOTOR ACTIVITY: WNL (within normal limits)  EYE CONTACT: Direct  SPEECH: Normal rate and tone  AFFECT: Full Range  MOOD: Anxious, Depressed and Neutral  THOUGHT PROCESS: Normal and Circumstantial  THOUGHT CONTENT: No unusual themes and Negative Rumination  PERCEPTION: No evidence of hallucinations  CURRENT SUICIDAL IDEATION: patient denies  CURRENT HOMICIDAL IDEATION: Patient denies  ORIENTATION: Alert and Oriented X 3.  CONCENTRATION: Good  MEMORY:   Recent: intact   Remote: intact  COGNITIVE FUNCTION: Average intelligence  JUDGMENT: Intact  IMPULSE CONTROL: Fair  INSIGHT: Fair    Risk Assessment:  ASSESSMENT OF RISK FOR SUICIDAL BEHAVIOR  Changes in risk for suicide from baseline Formulation of Risk and/or previous intake, including newly identified risk, if any: none    Violence risk was assessed and No Change noted from baseline formulation of risk and/or previous assessment.     Session Content:: Patient reports an absence of SI. She said that she found out her spouse's multiple sclerosis is progressing and is distressed about this. Patient talked about her fears for her spouse but is relieved that the SI did not resume. Writer validated patient's thoughts and feelings. Writer and patient discussed patient's thought process around this and how to have productive discussion about how to manage transportation.  Patient is taking care of physical illness. Writer and patient discussed patient transitioning out of treatment.    Visit Diagnosis:    ICD-10-CM ICD-9-CM   1. 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 OCD (obsessive compulsive disorder) F42.9 300.3   3. Borderline personality disorder F60.3 301.83   4. PTSD (post-traumatic stress disorder) F43.10 309.81       Interventions:  Supportive Psychotherapy, Taught/practiced communication skills (specify skills used):  Interpersonal, Treatment Planning, Solution Focused therapy    Current Treatment Plan   Created/Updated On 01/10/2016   Next Treatment Plan Due 04/09/2016     Plan:  Psychotherapy continues as described in care plan; plan remains the same.    NEXT APPT: 02/05/16 @1       Tresa Res, LCSW

## 2016-01-10 NOTE — Telephone Encounter (Signed)
Information given to patient, understanding expressed.

## 2016-01-10 NOTE — BH Treatment Plan (Signed)
[facility] Treatment [facility]     Date of Plan:   Created/Updated On 01/10/2016   FROM 01/10/2016      Created/Updated On 01/10/2016   Next Treatment Plan Due 04/09/2016       Diagnostic Impression    ICD-10-CM ICD-9-CM   1. 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 OCD (obsessive compulsive disorder) F42.9 300.3   3. Borderline personality disorder F60.3 301.83   4. PTSD (post-traumatic stress disorder) F43.10 309.81       Strengths  Strengths derived from the assessment include: Patient is hard working, has supports.    Problem Areas  *At least one problem must be targeted toward risk reduction if Formulation of Risk or any other previous exam indicated special monitoring or intervention for suicide and/or violence risk indicated.    PROBLEM AREAS (choose and describe relevant):  THOUGHT: Negative  MOOD:Depressed and anxious  BEHAVIOR: Reactive and avoidant  ECONOMIC:SSDI  ________________________________________________________________  Treatment Problem #1 01/10/2016   Patient Identified Problem Depression and anxiety        Treatment Goal #1 01/10/2016   Patient Identified Goal Learn DBT and PST skills to manage affect       The rationale for addressing this problem is that resolving it will (select all that apply):  Reduce symptoms of disorder, Reduce functional impairment associated with disorder, Decrease likelihood of hospitalization, Facilitate transfer skills learned in therapy to everday life, Is a key motivational factor for the patient's participation in treatment, Reduce risk for suicide* and Reduce risk for violence*      Progress toward goal(s): Problem resolving. Comment: Patient continues to remain emotionally stable most days with no SI. Patient is still experiencing health issues and is also dealing with her spouse's illness of MS.    1 a. Measurable Objectives : Patient will make and keep regular individual therapy appointments.   Date established:  05/31/14   Target date: 04/09/16   Attained or Revised? continue    1 b. Measurable Objectives : Patient will continue to learn and utilize skills to manage affect.   Date established: 05/31/14   Target date: 04/09/16   Attained or Revised? continue    ______________________________________________________________________      Plan  TREATMENT MODALITIES:  Individual psychotherapy for 30-60 min Q 1-3 weeks with Tresa Res, LCSW.  Psychopharmacology visits 20-45 min Q 2-8 weeks with [name], NP.     DISCHARGE CRITERIA for this treatment setting: Patient reports a reduction in symptoms as evidenced by PHQ-9 and GAD-7 scores.    Clinician's name: Tresa Res, LCSW    Psychiatrist's Name: Donny Pique, MD      Patient/Family Statement  PATIENT/FAMILY STATEMENT:  Obtain patient and family input into the treatment plan, including areas of agreement / disagreement.  Obtain patient's signature - if not possible, briefly describe the reason.     Patient Comments:          I HAVE PARTICIPATED IN THE DEVELOPMENT OF THIS TREATMENT PLAN AND I AGREE WITH ITS CONTENTS:       Patient Signature: _______________________________________________________    Date: ______________________________

## 2016-01-10 NOTE — Telephone Encounter (Signed)
Lets try Librax (sent to [facility] pharmacy). I will do PA if needed.    [name], MD  [facility]  01/10/2016  8:30 AM

## 2016-01-10 NOTE — Telephone Encounter (Signed)
Plan established to support; will pick up VGO30 from office today;

## 2016-01-10 NOTE — Telephone Encounter (Signed)
I will not be ordering any pain medication due to the constipating side effect. Would continue to use the bentyl and lomotil and f/u for colonoscopy when they can offer her a visit.     [name], MD  [facility]  01/10/2016  2:21 PM

## 2016-01-10 NOTE — Telephone Encounter (Signed)
Pt states the script is  $105.0. Pt says she can not afford and would like for Dr.Moore  to order pain medication.

## 2016-01-11 LAB — PARASITE GI PCR PANEL: Parasite GI PCR panel: 0

## 2016-01-12 ENCOUNTER — Telehealth: Payer: Self-pay

## 2016-01-12 MED ORDER — MIDODRINE HCL 10 MG PO TABS *I*
10.0000 mg | ORAL_TABLET | Freq: Three times a day (TID) | ORAL | 1 refills | Status: DC
Start: 2016-01-12 — End: 2016-01-12

## 2016-01-12 MED ORDER — MIDODRINE HCL 10 MG PO TABS *I*
10.0000 mg | ORAL_TABLET | Freq: Three times a day (TID) | ORAL | 0 refills | Status: DC
Start: 2016-01-12 — End: 2021-04-27

## 2016-01-12 NOTE — Telephone Encounter (Signed)
Reviewed patient with Dennison Mascot and reviewed patient record.   Writer has assisted patient in the past (May) due to difficulty with affording medications (Humalog) and being in donut hole. Patient was awaiting approval through PAP program and ran out of her Humalog.  Will need to find out cost of midodrine through [place] once script sent.  Please let writer know when the prescription has been sent so cost can be obtained.    [facility]- Rx Outreach may be an option for patient going forward.    Mathews Argyle, LMSW

## 2016-01-12 NOTE — Telephone Encounter (Signed)
Patient aware and will pick up. Pharmacy hours provided.    Mathews Argyle, LMSW

## 2016-01-12 NOTE — Telephone Encounter (Signed)
Spoke with [facility] pharmacist and patient to conduct med rec. Consistent with pharmacy records, patient reports that she ran out of midodrine earlier this week and has passed out several times. Is in doughnut hole and cannot afford copay. Spoke with PCN SW Kerri Perches who can do one time voucher. Prescription needs to be sent to [facility] outpatient pharmacy for 1 month with no refills.

## 2016-01-12 NOTE — Telephone Encounter (Signed)
Noted, thanks for assisting patient in obtaining medication, will leave for Dr. Laurance Flatten to address upon return

## 2016-01-12 NOTE — Telephone Encounter (Signed)
Covering for Dr. Laurance Flatten, order placed for midodrine 10 mg TID and sent to [facility] outpatient pharmacy    Please let patient know if you need anything else, thanks for reaching out to patient.     Nelta Numbers, MD

## 2016-01-12 NOTE — Telephone Encounter (Signed)
Call to [facility] to obtain price for Patient's midodrine. Spoke with Seth Bake- confirmed patient copay for medication is $70. Social Work covered cost of patient copay for medication for this month.   Will need long-term plan in place going forward if patient unable to obtain due to cost.    Mathews Argyle, LMSW

## 2016-01-12 NOTE — Progress Notes (Signed)
[facility] SOCIAL WORK  PHARMACY FORM     Todays date:  January 12, 2016    Patient Name: Kaylee Harris Record #: [id number]   DOB: 1975/09/04  Patients Address: [place] Worker: Mathews Argyle, LMSW       Date of Service: January 12, 2016       Funding Source: [place]  ___________________________________________________________________    Pharmacy Information: for midodrine- patient copay $70  Date/time sent: January 12, 2016     Time needed:  As soon as able    Patient Location: Outpatient    Medication Pick-up Preference: Patient will pick up at the pharmacy    Pharmacy Contact:  [place] work signature: Mathews Argyle, LMSW  Supervisor/Manager Approval (if indicated):  Date:  (supervisor signature not required for Medicaid pending)

## 2016-01-12 NOTE — Telephone Encounter (Signed)
Santiago Glad,     I have sent in the script for the midodrine, I am covering for Dr. Laurance Flatten today.     Nelta Numbers, MD

## 2016-01-14 ENCOUNTER — Other Ambulatory Visit: Payer: Self-pay | Admitting: Gastroenterology

## 2016-01-14 ENCOUNTER — Other Ambulatory Visit: Payer: Self-pay | Admitting: Psychiatry

## 2016-01-14 DIAGNOSIS — F419 Anxiety disorder, unspecified: Secondary | ICD-10-CM | POA: Insufficient documentation

## 2016-01-15 ENCOUNTER — Telehealth: Payer: Self-pay

## 2016-01-15 ENCOUNTER — Encounter: Payer: Self-pay | Admitting: Registered Nurse

## 2016-01-15 ENCOUNTER — Other Ambulatory Visit: Payer: Self-pay | Admitting: Psychiatry

## 2016-01-15 ENCOUNTER — Other Ambulatory Visit: Payer: Self-pay

## 2016-01-15 ENCOUNTER — Telehealth: Payer: Self-pay | Admitting: Primary Care

## 2016-01-15 ENCOUNTER — Encounter: Payer: Self-pay | Admitting: Gastroenterology

## 2016-01-15 DIAGNOSIS — G90A Postural orthostatic tachycardia syndrome (POTS): Secondary | ICD-10-CM | POA: Insufficient documentation

## 2016-01-15 DIAGNOSIS — I498 Other specified cardiac arrhythmias: Secondary | ICD-10-CM | POA: Insufficient documentation

## 2016-01-15 LAB — TROPONIN T
Troponin T: <0.01 ng/mL (ref 0.00–0.04)
Troponin T: <0.01 ng/mL (ref 0.00–0.04)

## 2016-01-15 LAB — CBC
Hematocrit: 36 % (ref 34–47)
Hemoglobin: 11.2 g/dL — ABNORMAL LOW (ref 11.5–16.0)
MCH: 26.7 pg (ref 26.0–34.0)
MCHC: 31.2 g/dL — ABNORMAL LOW (ref 32.0–36.0)
MCV: 86 fL (ref 81–99)
Platelets: 281 10*3/uL (ref 140–400)
RBC: 4.2 10*6/uL (ref 3.80–5.20)
RDW: 14.4 % (ref 11.5–15.0)
WBC: 11.2 10*3/uL — ABNORMAL HIGH (ref 4.0–10.0)

## 2016-01-15 LAB — BASIC METABOLIC PANEL
Anion Gap: 6 mEq/L — ABNORMAL LOW (ref 7–16)
CO2: 25 mEq/L (ref 20–31)
Calcium: 8.6 mg/dL (ref 8.6–10.2)
Chloride: 109 mEq/L (ref 99–109)
Creatinine: 0.85 mg/dL (ref 0.50–1.10)
Glucose: 120 mg/dL — ABNORMAL HIGH (ref 60–99)
Lab: 9 mg/dL (ref 9–23)
Potassium: 3.9 mEq/L (ref 3.5–5.1)
Sodium: 140 mEq/L (ref 136–145)
UN/Creat Ratio: 10.6 (ref 10.0–20.0)

## 2016-01-15 LAB — POCT GLUCOSE
Glucose POCT: 127 mg/dL — ABNORMAL HIGH (ref 60–98)
Glucose POCT: 115 mg/dL — ABNORMAL HIGH (ref 60–98)
Glucose POCT: 157 mg/dL — ABNORMAL HIGH (ref 60–98)

## 2016-01-15 LAB — ESTIMATED GFR
GFR,Black: >60 mL/min
GFR,Caucasian: >60 mL/min

## 2016-01-15 MED ORDER — TRAMADOL HCL 50 MG PO TABS *I*
50.0000 mg | ORAL_TABLET | Freq: Four times a day (QID) | ORAL | 0 refills | Status: DC | PRN
Start: 2016-01-15 — End: 2016-01-18

## 2016-01-15 MED ORDER — MIRTAZAPINE 15 MG PO TABS *I*
15.0000 mg | ORAL_TABLET | Freq: Every evening | ORAL | 2 refills | Status: DC
Start: 2016-01-15 — End: 2016-04-15

## 2016-01-15 NOTE — Telephone Encounter (Signed)
Complicated patient well known by Dr. Laurance Flatten.   Dr. Laurance Flatten will be back in the office tomorrow.   Alosetron is for irritable bowel syndrome.  This can wait for Dr. Laurance Flatten to address later this week.   Please let patient know that we will work on this this week but it won't be immediate.

## 2016-01-15 NOTE — Telephone Encounter (Signed)
Patient returned my call to discuss strategies to help her afford medications. Provided link to rxoutreach.org via MyChart. Patient aware that the diabetic educator can provide some supplies for the VGO which will give her time to start saving for when she needs to purchase herself for the last bit of 2017.

## 2016-01-15 NOTE — Telephone Encounter (Signed)
Received fax from patients Pharmacy stating that the Rx for Alosetron 0.5mg  tab is way too expensive for patient Rx is 515.00 can an alternative be prescribed.

## 2016-01-15 NOTE — Telephone Encounter (Signed)
I vouchered her $70 copay. Pharmacy was not able to tell what was left to get herself out of the donut hole.    Mathews Argyle, LMSW

## 2016-01-15 NOTE — Telephone Encounter (Signed)
Left message on machine for pt to return call. See message below.

## 2016-01-15 NOTE — Telephone Encounter (Signed)
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 Spoke with [facility] pharmacist and patient to reconcile meds. Wife manages medications due to previous OD and keeps and updated list of what patient is taking. Patient is in donut hole and have trouble affording medications.     Abdominal Pain, Diarrhea, Constipation  Uses Bentyl and/or Lomotil for diarrhea. Bentyl effective but results in constipation. Reports using Lomotil QID on most days but holding now due to constipation. Librax and Doreatha Martin were unaffordable.    From a purely medication standpoint, she is on several agents known to cause GI effects (Trulicity, Topamax, Lamictal, Abilify, Cymbalta). With the exception of Lithium, she has been stable on these agents for some time and it would be unlikely, though not impossible, for GI effects to develop spontaneously.     Onset of GI symptoms does appear to correlate with increase in Lamictal dose in July, though she did seem to tolerate 150 mg prior. Given the time correlation, consideration could be given to collaborating with psych to determine if a trial dose reduction would be feasible.       If symptoms are not medication related, a bile acid sequestrant may be an affordable alternative. Of note, these agents may reduce absorption of other medications and should be taken at least 1 hour before or 4 hours after other agents.      Liver Dose Adjustments  At this point, dose adjustments are not necessary based on liver function.     Medication Costs  Patient is currently in Medicare donut hole. Per patient, she does not qualify for Medicaid.   - Currently receiving Cymbalta, Trulicity, and Humalog vials through patient assistance programs through 2017.     - Lost assistance for VGO. As of 01/12/16, she had a 23 day supply. Attempted to contact manufacturer but have yet to receive response. Diabetic educator,  Gay Filler, can provide 1 - 2. Spoke with Gay Filler and unfortunately there are no alternatives or other financial assistance options for this product.  - A 90 day supply of midodrine can be obtained through [employer] for $35.  - Current copays for Abilify and buspirone are $31 and $22, respectively. If dose of buspirone could be reduced to 30 mg once daily without greatly compromising benefit, patient could obtain 90 days for $16 through [place] https://www.riteaid.com/shop/info/pharmacy/prescription-savings/rite-aid-prescription-savings-program/directory-of-generic-medications  - Nearly all other copays are $12 or less through insurance. Cumulatively the total cost is high because of the number of medication she is on but unfortunately there does not appear to be opportunities to discontinue any of these agents.      SUMMARY  1. Onset of GI symptoms somewhat correlates with Lamictal dose increase in July. Consideration could be given to collaborating with psych to determine if a trial dose reduction would be feasible.   2. A bile acid sequestrant may be an affordable alternative if GI symptoms are related to IBS.  3. No dose adjustments are necessary based on current LFTs.   4. Coordinated with diabetic educator, Gay Filler, who can provide patient with 1 - 2 months of VGO supplies. At this time, there are no similar products or other financial assistance programs and patient will likely have to pay out of pocket for the last few weeks of 2017.   5. A 90 day supply of midodrine can be obtained through [employer] for $35.  6. If dose of buspirone could be  reduced to 30 mg once daily without greatly compromising benefit, the patient could obtain 90 days for $16 through [place] https://www.riteaid.com/shop/info/pharmacy/prescription-savings/rite-aid-prescription-savings-program/directory-of-generic-medications    Thank you for allowing me to participate in the care of your  patient. Please do not hesitate to contact me with additional questions or concerns or if you would like me to provide follow-up information to this patient.    Thank you,    Dennison Mascot, PharmD, MSCI, BCACP

## 2016-01-15 NOTE — Telephone Encounter (Signed)
[facility] / PRACTICE CALL NOTE    Date and Time of Call: 01/15/2016 6:25 PM     PCP:  [name], MD     Problem: Patient reports that she hit her head when she passed out and hit it on the tub yesterday.  She states that she was admitted overnight to [facility] in the ED obs unit and she was given Tramadol, 3 tablets total while she was in the hospital at [facility] and these worked to help control the headache she has from the head bump and she is calling to request a few tramadol for the continued head pain. Left hospital at 4:45 PM today and she states that the doctors at [facility] told her she would need to call her PCP if she felt she needed any Tramadol.     Impression and Plan: Head trauma, headache     Multiple medical problems, polypharmacy.     I d/w patient that she is on a multitude of medications already, and that Tramadol can have interactions with other meds. Patient reports she did fine with the tramadol in the hospital and would like to have a few to use for now until she can see Dr. Laurance Flatten.  I agreed to Rx her 8 tablets of Tramadol for headache, but that this is NOT a medication she should be on long term and that her headache will continue to improve the longer she is out from the head trauma.     Pt agrees to call for a same day appointment with Dr. Laurance Flatten, her PCP, in the next 1-2 days.     The patient is instructed to call back if patient is having worsening symptoms or lack of improvement of current symptoms with the above treatment, and patient expressed understanding.    Jennelle Human, MD

## 2016-01-16 ENCOUNTER — Telehealth: Payer: Self-pay

## 2016-01-16 NOTE — Telephone Encounter (Signed)
Will not be providing alternative at this time. Will work on reducing lamictal dose which seems to correlate to GI symptoms    [name], MD  [facility]  01/16/2016  9:17 PM

## 2016-01-16 NOTE — Telephone Encounter (Signed)
Pt notified that this issue with be addressed by Dr.Moore over the next couple of days due to Dr.Moore being out of the office.

## 2016-01-16 NOTE — Telephone Encounter (Signed)
Kaylee Harris, Please see our pharmacists excellent review of med costs below. Pt is unable to afford meds recently AND has increased abd pain currently being worked up.    What do you think about suggesting a dose decrease of her lamictal (timing of her increase was about the time of her abd pain), and reducing her buspirone to 30mg  once daily (less expensive)?    [name], MD  [facility]  01/16/2016  9:19 PM

## 2016-01-16 NOTE — Telephone Encounter (Signed)
ED f/u call.  Pt advised of importance to be seen in this office.  Pt states she attempted to schedule today but no appts available.  Pt strongly encouraged to call the office tomorrow at 0900 to secure a same day appt.  Pt states she will do so.

## 2016-01-17 ENCOUNTER — Telehealth: Payer: Self-pay

## 2016-01-17 ENCOUNTER — Encounter: Payer: Self-pay | Admitting: Registered Nurse

## 2016-01-17 NOTE — Telephone Encounter (Signed)
Pt advised that DM supplies here in office for her to pick up.  Pt states her spouse, [name], will be in office today to get.

## 2016-01-18 ENCOUNTER — Ambulatory Visit: Payer: Self-pay | Admitting: Primary Care

## 2016-01-18 ENCOUNTER — Encounter: Payer: Self-pay | Admitting: Primary Care

## 2016-01-18 ENCOUNTER — Other Ambulatory Visit: Payer: Self-pay | Admitting: Psychiatry

## 2016-01-18 VITALS — BP 114/84 | HR 72 | Temp 98.7°F | Ht 68.0 in | Wt 287.2 lb

## 2016-01-18 DIAGNOSIS — R55 Syncope and collapse: Secondary | ICD-10-CM

## 2016-01-18 DIAGNOSIS — R109 Unspecified abdominal pain: Secondary | ICD-10-CM

## 2016-01-18 DIAGNOSIS — R197 Diarrhea, unspecified: Secondary | ICD-10-CM

## 2016-01-18 DIAGNOSIS — K760 Fatty (change of) liver, not elsewhere classified: Secondary | ICD-10-CM

## 2016-01-18 DIAGNOSIS — K580 Irritable bowel syndrome with diarrhea: Secondary | ICD-10-CM

## 2016-01-18 MED ORDER — TRAMADOL HCL 50 MG PO TABS *I*
50.0000 mg | ORAL_TABLET | Freq: Three times a day (TID) | ORAL | 0 refills | Status: DC | PRN
Start: 2016-01-18 — End: 2016-01-19

## 2016-01-18 MED ORDER — BUSPIRONE HCL 30 MG PO TABS *I*
ORAL_TABLET | ORAL | 2 refills | Status: DC
Start: 2016-01-18 — End: 2016-04-15

## 2016-01-18 MED ORDER — LAMOTRIGINE 100 MG PO TABS *I*
100.0000 mg | ORAL_TABLET | Freq: Every day | ORAL | 0 refills | Status: DC
Start: 2016-01-18 — End: 2016-03-04

## 2016-01-18 MED ORDER — BUSPIRONE HCL 30 MG PO TABS *I*
ORAL_TABLET | ORAL | 2 refills | Status: DC
Start: 2016-01-18 — End: 2016-01-18

## 2016-01-18 NOTE — Progress Notes (Signed)
Clinical Management Note     Writer received a message from patient's PCP.  She has been having increased GI distress.  Her medications were reviewed by her pharmacist and the change seems to have occurred when her lamotrigine was increased.  The suggested was made to try decreasing the dose back to 100 mg.  Writer spoke with the patient and she is in agreement.  Also she is in the donut hole for her medications and it is cheaper for her to take one 30 mg tablet of buspirone than 2 if she gets it through Rx outreach.  Writer discussed with the patient that we can try decreasing the dose to 30 mg daily but suggested she split the tablet so she is taking 15 mg bid. Patient was in agreement with trying this.

## 2016-01-18 NOTE — Addendum Note (Signed)
Addended by: [name] Freer on: 01/18/2016 08:40 AM     Modules accepted: Orders

## 2016-01-18 NOTE — Progress Notes (Signed)
[facility]    SUBJECTIVE    Pt is here to discuss:    Chief Complaint   Patient presents with    Loss of Consciousness     1. LOC - recurrent syncopal episode 9/10 in bathroom. Attributes it to her chronic diarrhea. Did not feel prodrome of sx or warning. Fell and hit head on bathtub. Wife came in and found her with small amount of bleeding from scalp and confusion. In ED, imaging including CT head and neck normal. Labs, trops normal. Given IVF and pain control.     Upon d/c, no pain meds provided and called our office. Was provided 8 pills of tramadol. Wife Kaylee Harris has been giving Kaylee Harris the pills, q8h at max. Has 2 pills left as of today. Kaylee Harris describes it helps with her head and abd pain, and lessens her diarrhea. Requesting refill, as she cannot afford the IBS pills that we have tried to prescribe: Librax, alosetron, viberzi. Bentyl is not effective enough. Lomotil constipates her too much.     Since our last visit, pharmacist went through med list and suggested weaning lamictal. abd sx seem to have onset at time of dose increase. Additional changes were made to help with affordability as well.     PMH / Family Hx / Social Hx  Patient's medications, allergies, problem list, past medical, social histories were reviewed and notable for:    Current Outpatient Prescriptions   Medication Sig    lamoTRIgine (LAMICTAL) 100 MG tablet Take 1 tablet (100 mg total) by mouth daily    busPIRone (BUSPAR) 30 MG tablet Take 1/2 tablet twice daily    traMADol (ULTRAM) 50 MG tablet Take 1 tablet (50 mg total) by mouth every 8 hours as needed (for head pain and abd pain)   Max daily dose: 150 mg    mirtazapine (REMERON) 15 MG tablet Take 1 tablet (15 mg total) by mouth nightly    ascorbic acid (VITAMIN C) 100 MG tablet Take 100 mg by mouth daily    midodrine (PROAMATINE) 10 MG tablet Take 1 tablet (10 mg total) by mouth 3 times daily    dicyclomine (BENTYL) 20 MG tablet Take 1 tablet (20 mg total) by mouth 4  times daily (before meals and nightly)    insulin syringe-needle U-100 (BD ULTRAFINE) 31G X 5/16" 0.3 ML HALF-UNIT Use 3 times a day as instructed.    diphenoxylate-atropine (LOMOTIL) 2.5-0.025 MG per tablet Take 1 tablet by mouth 4 times daily as needed for Diarrhea   Max daily dose: 4 tablets    promethazine (PHENERGAN) 12.5 MG tablet Take 1 tablet (12.5 mg total) by mouth 4 times daily as needed    topiramate (TOPAMAX) 100 MG tablet Take 1 tablet (100 mg total) by mouth 2 times daily    SUMAtriptan (IMITREX) 50 MG tablet Take 1 tablet (50 mg total) by mouth as needed for Migraine   Take at onset of headache. May repeat once in 2 hours.    pantoprazole (PROTONIX) 40 MG EC tablet Take 1 tablet (40 mg total) by mouth daily   SWALLOW WHOLE. DO NOT CRUSH, BREAK, OR CHEW.    atorvastatin (LIPITOR) 40 MG tablet Take 1 tablet (40 mg total) by mouth daily (with dinner)    ARIPiprazole (ABILIFY) 15 MG tablet Take 1/2 tablet daily in the morning.    Insulin Disposable Pump (V-GO 30) KIT Use daily; 30ct/month.    insulin lispro (HUMALOG) 100 UNIT/ML injection vial For VGO: Dinner 12U (  6 clicks) ; B & L 4- 6 units. BG 100 - 150 8u (4clk), 151 - 200 10U (5clk), >201 12U ( 6 ck). MDD 10U (36 clk)    TRULICITY 7.25 DG/6.4QI SOPN INJECT 0.75 MG INTO THE SKIN ONCE A WEEK    DULoxetine (CYMBALTA) 60 MG capsule Take 1 capsule (60 mg total) by mouth daily    fluticasone (FLONASE) 50 MCG/ACT nasal spray 1 spray by Nasal route daily    VENTOLIN HFA 108 (90 BASE) MCG/ACT inhaler Inhale 1-2 puffs into the lungs every 4-6 hours as needed for Wheezing    clonazePAM (KLONOPIN) 0.5 MG tablet Take 1 tablet (0.5 mg total) by mouth 2 times daily as needed   Max daily dose: 1 mg    FREESTYLE LITE test strip Use four times daily as directed for 250.02    blood glucose monitor system Brand: cheapest brand available per her insurance.  Use as directed.    lancets Brand Free Style Lite; Use 2 times per day as directed for blood  glucose testing.    atenolol (TENORMIN) 25 MG tablet Take 25 mg by mouth daily    Alcohol Swabs (ALCOHOL WIPES) PADS Use BID for BG check    famciclovir (FAMVIR) 500 MG tablet Take 1 tablet (500 mg total) by mouth 3 times daily as needed    cetirizine (ZYRTEC) 10 MG tablet Take 10 mg by mouth daily    levonorgestrel (MIRENA) 20 MCG/24HR IUD 1 each by Intrauterine route once    Non-System Medication The above patient is followed in our clinic and cannot resume work permanently.          OBJECTIVE  Vitals:    01/18/16 1450   BP: 114/84   BP Location: Left arm   Patient Position: Sitting   Cuff Size: large adult   Pulse: 72   Temp: 37.1 C (98.7 F)   TempSrc: Temporal   Weight: 130.3 kg (287 lb 3.2 oz)   Height: 1.727 m (_0 )     Body mass index is 43.67 kg/(m^2).      General: well-appearing Caucasian female, pleasant & conversant, in NAD  Eyes:. PERRLA. EOMI. Conjunctiva pink, without swelling or exudate. Lids normal.   HENT: Moist mucous membranes.  Linear superficial yellowish scab at crown of scalp, no surroudning edema, erythema, hematoma. Exaggerated ttp.   Psych: AAOx3, a bit more irritable today than last visits.  Insight and judgement intact.           ASSESSMENT & PLAN  1. 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 Non-arrythmic per Dr Lovena Le 10/2014  Recent syncopal event attributed to diarrheal illness and dehydration. No severe injuries on my exam today. For this reason we will treat her abd pain and diarrhea as listed below.     2. Unspecified abdominal pain  3. Diarrhea, unspecified  4. Irritable bowel syndrome with diarrhea  5. Fatty liver disease, nonalcoholic  - traMADol (ULTRAM) 50 MG tablet; Take 1 tablet (50 mg total) by mouth every 8 hours as needed (for head pain and abd pain)   Max daily dose: 150 mg  Dispense: 42 tablet; Refill: 0  - Drug screen chemical dependency, urine; Future  - Drug screen chemical dependency, urine     Discussed that chronic  opiates are not something we are going to support, but will offer it short term (50moat most) as she is currently unable to afford alternative IBS drugs and is awaiting further GI w/u (  colonoscopy scheduled for November). Given the dehydrating and syncopal effect of the diarrhea, I feel that the benefit of preventing this from recurring outweighs the risk of providing this short course. Her wife Kaylee Harris was present today and I reviewed that she will need to be in possession of it and dosing it to [place] given her h/o med misuse. Reviewed terms/conditions of refills, need for UDS and avoidance of other substances, 2 week refills for now and inability to refill sooner. Discussed that if med is misused this or not adhered to as written on prescription, this is grounds for no longer continuing it. Reviewed inability to refill sooner that refill date permits. She and wife understand and accept these terms. 2 wk Rx provided today.    Follow-up: 21mo     JJohny Drilling MD  [facility]  01/18/2016  2:56 PM        ______________________

## 2016-01-18 NOTE — Telephone Encounter (Signed)
             I'm fine with trying the changes suggested. My understanding is buspirone really should be dosed twice daily due to the half life but she also could cut a 30 mg tablet in half.     Armanda Magic, could you rewrite the buspirone? What do you think about lowering the lamictal too? I can reach out to pt once you write it.    [name], MD  [facility]  01/18/2016  6:28 AM

## 2016-01-19 ENCOUNTER — Encounter: Payer: Self-pay | Admitting: Primary Care

## 2016-01-19 ENCOUNTER — Other Ambulatory Visit: Payer: Self-pay

## 2016-01-19 DIAGNOSIS — R109 Unspecified abdominal pain: Secondary | ICD-10-CM

## 2016-01-19 DIAGNOSIS — R197 Diarrhea, unspecified: Secondary | ICD-10-CM

## 2016-01-19 LAB — DRUG SCREEN CHEMICAL DEPENDENCY, URINE
Amphetamine,UR: NEGATIVE
Benzodiazepinen,UR: NEGATIVE
Cocaine/Metab,UR: NEGATIVE
Opiates,UR: NEGATIVE
THC Metabolite,UR: NEGATIVE

## 2016-01-19 MED ORDER — TRAMADOL HCL 50 MG PO TABS *I*
50.0000 mg | ORAL_TABLET | Freq: Three times a day (TID) | ORAL | 0 refills | Status: DC | PRN
Start: 2016-01-19 — End: 2016-02-29

## 2016-01-19 NOTE — Telephone Encounter (Signed)
Message routed to provider

## 2016-01-19 NOTE — Telephone Encounter (Signed)
Patient called stated that her Rx went to the wrong pharmacy ( Mail order)  she called and cancelled the Rx and would like it resent to the [facility] on [address].

## 2016-01-21 ENCOUNTER — Emergency Department
Admission: EM | Admit: 2016-01-21 | Discharge: 2016-01-21 | Disposition: A | Discharge status: Home / Self Care | Payer: Medicare (Managed Care) | Source: Ambulatory Visit | Attending: Emergency Medicine | Admitting: Emergency Medicine

## 2016-01-21 ENCOUNTER — Other Ambulatory Visit: Payer: Self-pay | Admitting: Gastroenterology

## 2016-01-21 ENCOUNTER — Telehealth: Payer: Self-pay | Admitting: Primary Care

## 2016-01-21 ENCOUNTER — Encounter: Payer: Self-pay | Admitting: Student in an Organized Health Care Education/Training Program

## 2016-01-21 DIAGNOSIS — H5710 Ocular pain, unspecified eye: Secondary | ICD-10-CM

## 2016-01-21 DIAGNOSIS — Y998 Other external cause status: Secondary | ICD-10-CM | POA: Insufficient documentation

## 2016-01-21 DIAGNOSIS — Y92090 Kitchen in other non-institutional residence as the place of occurrence of the external cause: Secondary | ICD-10-CM | POA: Insufficient documentation

## 2016-01-21 DIAGNOSIS — Z87891 Personal history of nicotine dependence: Secondary | ICD-10-CM | POA: Insufficient documentation

## 2016-01-21 DIAGNOSIS — Y9389 Activity, other specified: Secondary | ICD-10-CM | POA: Insufficient documentation

## 2016-01-21 DIAGNOSIS — W01198A Fall on same level from slipping, tripping and stumbling with subsequent striking against other object, initial encounter: Secondary | ICD-10-CM | POA: Insufficient documentation

## 2016-01-21 DIAGNOSIS — S0511XA Contusion of eyeball and orbital tissues, right eye, initial encounter: Secondary | ICD-10-CM | POA: Insufficient documentation

## 2016-01-21 LAB — BHCG, QUANT PREGNANCY: BHCG, QUANT PREGNANCY: <1 m[IU]/mL (ref 0–1)

## 2016-01-21 LAB — CBC AND DIFFERENTIAL
Baso # K/uL: 0.1 10*3/uL (ref 0.0–0.1)
Basophil %: 0.4 %
Eos # K/uL: 0.1 10*3/uL (ref 0.0–0.4)
Eosinophil %: 1 %
Hematocrit: 39 % (ref 34–45)
Hemoglobin: 12.2 g/dL (ref 11.2–15.7)
IMM Granulocytes #: 0.1 10*3/uL (ref 0.0–0.1)
IMM Granulocytes: 0.6 %
Lymph # K/uL: 2.3 10*3/uL (ref 1.2–3.7)
Lymphocyte %: 19.2 %
MCH: 27 pg (ref 26–32)
MCHC: 31 g/dL — ABNORMAL LOW (ref 32–36)
MCV: 86 fL (ref 79–95)
Mono # K/uL: 0.5 10*3/uL (ref 0.2–0.9)
Monocyte %: 4.4 %
Neut # K/uL: 8.9 10*3/uL — ABNORMAL HIGH (ref 1.6–6.1)
Nucl RBC # K/uL: 0 10*3/uL (ref 0.0–0.0)
Nucl RBC %: 0 /100 WBC (ref 0.0–0.2)
Platelets: 394 10*3/uL — ABNORMAL HIGH (ref 160–370)
RBC: 4.5 MIL/uL (ref 3.9–5.2)
RDW: 14.2 % (ref 11.7–14.4)
Seg Neut %: 74.4 %
WBC: 12 10*3/uL — ABNORMAL HIGH (ref 4.0–10.0)

## 2016-01-21 LAB — BASIC METABOLIC PANEL
Anion Gap: 13 (ref 7–16)
CO2: 24 mmol/L (ref 20–28)
Calcium: 9.1 mg/dL (ref 8.8–10.2)
Chloride: 106 mmol/L (ref 96–108)
Creatinine: 0.91 mg/dL (ref 0.51–0.95)
GFR,Black: 90 *
GFR,Caucasian: 78 *
Glucose: 137 mg/dL — ABNORMAL HIGH (ref 60–99)
Lab: 9 mg/dL (ref 6–20)
Potassium: 3.9 mmol/L (ref 3.3–5.1)
Sodium: 143 mmol/L (ref 133–145)

## 2016-01-21 LAB — BLOOD BANK HOLD LAVENDER

## 2016-01-21 MED ORDER — OXYCODONE-ACETAMINOPHEN 5-325 MG PO TABS *I*
2.0000 | ORAL_TABLET | Freq: Once | ORAL | Status: AC
Start: 2016-01-21 — End: 2016-01-21
  Administered 2016-01-21: 2 via ORAL
  Filled 2016-01-21: qty 2

## 2016-01-21 NOTE — Telephone Encounter (Signed)
[facility] / PRACTICE CALL NOTE    Date and Time of Call: 01/21/2016 6:28 PM     PCP:  [name], MD     Problem: Patient calling b/c she passed out 2 days ago and hit her head/face on sink and has had swelling/pain in area of R eye since then.  Today pain is worse, vision is getting worse, says now things are "black" and can't see well, and has more pain around the eye.      Impression and Plan: Advised patient to go to [facility] ED for evaluation.  Possibly needs CT of face/orbit to rule out orbital fracture.  Pt is agreeable and says her spouse will drive her there now.         Jennelle Human, MD

## 2016-01-21 NOTE — Discharge Instructions (Signed)
Please review any accompanying literature given to you at discharge. These forms provide important information regarding your diagnosis.     Please take any prescriptions as prescribed until finished or otherwise instructed by your doctor.    Continue to take your previously prescribed medications as prescribed unless otherwise instructed to do so.     Should you experience any new or worsening symptoms please don't hesitate to return to the ED for further evaluation.            Call [phone] [facility] first thing tomorrow at 8am to find out when to arrive for your eye appointment that day. We have contacted the optho doctor on call to let them know that you need an urgent referral appointment tomorrow.     If any new or concerning symptoms before then, go to the ED at [facility].

## 2016-01-21 NOTE — ED Provider Notes (Addendum)
History     Chief Complaint   Patient presents with    Chest Pain     HPI Comments: 40 y.o.f, with hx recurrent syncope (working diagnosis vascular vs. Psychogenic)- non-cardiogenic, here for R eye pain and vision changes after fall 2 days ago. Lost balance and hit left side of face on edge of sink. Today, woke up with tunnel vision and grey floaters. Pain with eye movements.     No headache, neck pain, or focal weakness/numbness/tingling. Called PCP, and was advised to come in for CT.     At bedside, also endorsing some epigastric pressure- has had before- started at rest, non-exertional, with no shortness of breath or relieving/exacerbating factors. Upon chart review, has had extensive and negative cardiac work-up.       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 Lower abdomen with cellulitis changes, wound probed and purulent material expressed.  Had PICC line for "multiple infiltrations" of what? D/c-ed home on 10d of Augmentin 9/11.      Anginal pain     Anxiety     Arthritis     Asthma     Complication of anesthesia     Depression     Diabetes mellitus     Previously on SU and metformin, now diet controlled    Diverticulitis 09/2010    Dysfunctional uterine bleeding     Fibromyalgia     GERD (gastroesophageal reflux disease)     Hyperlipidemia     Migraine     Neuromuscular disorder     Sebaceous cyst of breast     right axilla    Varicella         Past Surgical History:   Procedure Laterality Date    APPENDECTOMY  2016    arthroscopic shoulder surgery Right 2010    Related to lifting    CARDIAC CATHETERIZATION  09/2011    negative    DILATION AND CURETTAGE OF UTERUS  2005, 2007    x2 for menorraghia    LEFT COLECTOMY  01/03/14    Dr Tresa Res     loop recorder  Feb 03 2014    SMALL INTESTINE SURGERY      TONSILLECTOMY       Family History   Problem Relation Age of Onset    Hypertension Father     Diabetes Father     Kidney Disease Father     Elevated lipids Father     Heart attack Father 28    Other Father      PVD    Hypertension Mother     Elevated lipids Mother     Heart attack Mother 37    Diabetes Mother     Eczema Mother     Psoriasis Mother     Hypertension Brother     Heart Disease Brother 65     prinzmetal's angina    Heart Disease Sister      currently having work up    [name] Sister     Hypertension Sister     Breast cancer Maternal Grandmother     Stroke Other        Social History    reports that she has  quit smoking. Her smoking use included Cigarettes. She has a 1.50 pack-year smoking history. She has never used smokeless tobacco. She reports that she drinks alcohol. She reports that she currently engages in sexual activity and has had female partners. She reports that she does not use illicit drugs.    Living Situation     Questions Responses    Patient lives with Significant Other    Comment: Lives with Wife, [name]     Homeless No    Caregiver for other family member No    External Services Mental Health Services    Comment: [facility]     Employment Disabled    Domestic Violence Risk No          Problem List     Patient Active Problem List   Diagnosis Code    DM (diabetes mellitus), type 2, uncontrolled E11.65    Migraine with Kaylee G43.109    Asthma J45.909    Hyperlipemia E78.5    Fibromyalgia M79.7    Syncope and collapse- recurrent, working diagnosis vascular vs psychogenic. Non-arrythmic per Dr Lovena Le 10/2014 R55    Depression, major, recurrent, moderate F33.1    IUD (intrauterine device) in place Z97.5    Skin lesion of face L98.9    IBS (irritable bowel syndrome) K58.9    Anxiety F41.9    Mixed obsessional thoughts and acts F42.2    Carpal tunnel syndrome G56.00    Meralgia paresthetica of  left side G57.12    No diabetic retinopathy in both eyes Z03.89    At risk for abuse of opiates Z91.89    History of repeated overdose Z91.5    Borderline personality disorder F60.3    Neck pain M54.2    Fatty liver disease, nonalcoholic [id number]       Review of Systems   Review of Systems   Constitutional: Negative for fever.   Eyes: Positive for pain and visual disturbance. Negative for discharge.   Respiratory: Negative for shortness of breath.    Cardiovascular: Negative for chest pain.   Gastrointestinal: Negative for abdominal pain.   Genitourinary: Negative for flank pain.   Musculoskeletal: Negative for back pain and neck pain.   Skin: Negative for wound.   Allergic/Immunologic: Negative for immunocompromised state.   Neurological: Negative for headaches.   Hematological: Does not bruise/bleed easily.   Psychiatric/Behavioral: Negative for agitation.       Physical Exam     ED Triage Vitals   BP Pulse Heart Rate (via Pulse Ox) Resp Temp Temp src SpO2 O2 Device O2 Flow Rate   01/21/16 1947 -- 01/21/16 1947 01/21/16 1947 01/21/16 1947 01/21/16 1947 01/21/16 1947 01/21/16 1947 --   140/84  92 16 36 C (96.8 F) TEMPORAL 99 % None (Room air)       Weight           01/21/16 1947           127.5 kg (281 lb)                    Physical Exam   Constitutional: She is oriented to person, place, and time. She appears well-developed and well-nourished. No distress.   HENT:   Head: Normocephalic and atraumatic.   Mouth/Throat: Oropharynx is clear and moist.   Eyes: Conjunctivae and EOM are normal. Pupils are equal, round, and reactive to light.   Mild right peri-orbital ecchymosis and mild swelling with tenderness.  Normal EOM and tracking. No hyphema  or corneal abnormalities.    Neck: Normal range of motion. Neck supple.   Cardiovascular: Normal rate, regular rhythm, normal heart sounds and intact distal pulses.    Pulmonary/Chest: Effort normal and breath sounds normal.   Abdominal: Soft. Bowel sounds are normal.  She exhibits no distension. There is no tenderness.   Musculoskeletal: Normal range of motion. She exhibits no edema.   Neurological: She is alert and oriented to person, place, and time. No cranial nerve deficit.   No cervical midline tenderness    Skin: Skin is warm and dry. She is not diaphoretic.   Psychiatric: She has a normal mood and affect.   Nursing note and vitals reviewed.      Medical Decision Making        Initial Evaluation:  ED First Provider Contact     Date/Time Event User Comments    01/21/16 2002 ED Provider First Contact LOU, LINDA Initial Face to Face Provider Contact          Patient seen by me 8:06 PM      Assessment:  40 y.o.female comes to the ED with R eye pain. Pt well appearing. Vitals stable, WNL, no focal neurologic deficits on exam .    Differential Diagnosis includes:    Orbital fracture, nerve entrapment, retinal tear   Posterior vitreous detachment   Low suspicion ICH, cervical fracture, or secondary injury obtained from fall   Low suspicion ACS, PE , pericarditis, dysrhythmia                        Plan:   Orders Placed This Encounter   Procedures    CT orbit, sella, fossa without contrast    Hold blue    Hold green with gel    Hold lavender    Hold SST    Blood bank hold lavender    CBC and differential    Basic metabolic panel    HCG, serum qualitative, pregnancy    CBC and differential    Basic metabolic panel    BHCG,serum    EKG 12 lead (initial)    Insert peripheral IV     CT orbit read pending, however no obvious fracture or bleed. Called optho at [facility] to set up appointment tomorrow for further evaluation. Will give patient phone number to call for appointment time.     Kaylee Dials, MD           Kaylee Dials, MD  Resident  01/21/16 2220    Resident Attestation:     Patient seen by me today, 01/21/2016 at 1040    History:   I reviewed this patient, reviewed the resident's note and agree.  Exam:   I examined this patient, reviewed the resident's note and  agree.    Decision Making:   I discussed with the resident his/her documented decision making  and agree.        Author Ludwig Lean, MD       Ludwig Lean, MD  01/21/16 [phone]

## 2016-01-21 NOTE — ED Triage Notes (Signed)
mscp non radiating and syncopal episode at 1830 hrs on 01-20-16, fell while in the bathroom striking her face on the sink, has edema and ecchymotic area on right side of face, states MSCP has persisted throughout the day       Triage Note   Mickle Mallory, RN

## 2016-01-22 ENCOUNTER — Ambulatory Visit: Payer: Self-pay | Admitting: Ophthalmology

## 2016-01-22 ENCOUNTER — Encounter: Payer: Self-pay | Admitting: Ophthalmology

## 2016-01-22 ENCOUNTER — Telehealth: Payer: Self-pay

## 2016-01-22 VITALS — BP 107/77 | HR 81

## 2016-01-22 DIAGNOSIS — S0511XA Contusion of eyeball and orbital tissues, right eye, initial encounter: Secondary | ICD-10-CM

## 2016-01-22 LAB — HOLD LAVENDER

## 2016-01-22 LAB — HOLD GREEN WITH GEL

## 2016-01-22 LAB — HOLD BLUE

## 2016-01-22 LAB — HM DIABETES EYE EXAM

## 2016-01-22 LAB — HOLD SST

## 2016-01-22 NOTE — Progress Notes (Addendum)
Outpatient Visit      Patient name: Kaylee Harris  DOB: 1975-06-13       Age: 40 y.o.  MR#: [id number]    Encounter Date: 01/22/2016    Subjective:     Chief Complaint:   Chief Complaint   Patient presents with    Follow-up     Emergency room follow up    Photophobia    Blurred Vision     tunnel vision    Eye Pain    Diplopia    Spots and/or Floaters    Redness In Eye     HPI     Follow-up    Additional comments: Emergency room follow up           Blurred Vision    Additional comments: tunnel vision           Comments   Kaylee Harris is a 40 year old woman here for follow up subsequent   emergency room visit 09.17.17    insulin dependent diabetes mellitus  Highest Blood Sugar in 2 weeks - 199  Lab Results       Component                Value               Date                       HA1C                     7.3 (H)             12/27/2015                Patient notes went to emergency room yesterday for right eye pain today is   "5" sharp and constant. Foreign body sensation right eye with pressure.   Vision changes after fall 3 days ago, "like tunnel vision". Lost balance   and hit left side of face on edge of sink. Seeing floaters in right eye   black in color float around eye, denies flashes. Pain with eye movements   to the right and to the left just " a little bit"  Right eye is a little   red.  Sees double with glasses off, side by side, images are separate, if   she covers right eye double vision goes away. If she wears her glasses she   does not have double vision.  Double vision is long standing, not new with   this trauma.  Sensitive to bright lights    CT Scan done yesterday       Last edited by Katherine Roan, MD on 01/22/2016  2:07 PM. (History)          Current Outpatient Prescriptions:     traMADol (ULTRAM) 50 MG tablet, Take 1 tablet (50 mg total) by mouth every 8 hours as needed (for head pain and abd pain)   Max daily dose: 150 mg, Disp: 42 tablet, Rfl: 0    lamoTRIgine (LAMICTAL) 100 MG  tablet, Take 1 tablet (100 mg total) by mouth daily, Disp: 30 tablet, Rfl: 0    busPIRone (BUSPAR) 30 MG tablet, Take 1/2 tablet twice daily, Disp: 30 tablet, Rfl: 2    mirtazapine (REMERON) 15 MG tablet, Take 1 tablet (15 mg total) by mouth nightly, Disp: 30 tablet, Rfl: 2    ascorbic acid (VITAMIN C) 100 MG tablet, Take  100 mg by mouth daily, Disp: , Rfl:     midodrine (PROAMATINE) 10 MG tablet, Take 1 tablet (10 mg total) by mouth 3 times daily, Disp: 90 tablet, Rfl: 0    dicyclomine (BENTYL) 20 MG tablet, Take 1 tablet (20 mg total) by mouth 4 times daily (before meals and nightly), Disp: 120 tablet, Rfl: 5    insulin syringe-needle U-100 (BD ULTRAFINE) 31G X 5/16" 0.3 ML HALF-UNIT, Use 3 times a day as instructed., Disp: 100 each, Rfl: 0    diphenoxylate-atropine (LOMOTIL) 2.5-0.025 MG per tablet, Take 1 tablet by mouth 4 times daily as needed for Diarrhea   Max daily dose: 4 tablets, Disp: 56 tablet, Rfl: 0    promethazine (PHENERGAN) 12.5 MG tablet, Take 1 tablet (12.5 mg total) by mouth 4 times daily as needed, Disp: 30 tablet, Rfl: 0    topiramate (TOPAMAX) 100 MG tablet, Take 1 tablet (100 mg total) by mouth 2 times daily, Disp: 60 tablet, Rfl: 5    SUMAtriptan (IMITREX) 50 MG tablet, Take 1 tablet (50 mg total) by mouth as needed for Migraine   Take at onset of headache. May repeat once in 2 hours., Disp: 9 tablet, Rfl: 5    pantoprazole (PROTONIX) 40 MG EC tablet, Take 1 tablet (40 mg total) by mouth daily   SWALLOW WHOLE. DO NOT CRUSH, BREAK, OR CHEW., Disp: 30 tablet, Rfl: 5    atorvastatin (LIPITOR) 40 MG tablet, Take 1 tablet (40 mg total) by mouth daily (with dinner), Disp: 30 tablet, Rfl: 5    ARIPiprazole (ABILIFY) 15 MG tablet, Take 1/2 tablet daily in the morning., Disp: 15 tablet, Rfl: 2    Insulin Disposable Pump (V-GO 30) KIT, Use daily; 30ct/month., Disp: 30 kit, Rfl: 11    insulin lispro (HUMALOG) 100 UNIT/ML injection vial, For VGO: Dinner 60V (6 clicks) ; B & L 4- 6 units.  BG 100 - 150 8u (4clk), 151 - 200 10U (5clk), >201 12U ( 6 ck). MDD 72U (36 clk), Disp: 10 mL, Rfl: 0    TRULICITY 3.71 GG/2.6RS SOPN, INJECT 0.75 MG INTO THE SKIN ONCE A WEEK, Disp: 2 mL, Rfl: 5    DULoxetine (CYMBALTA) 60 MG capsule, Take 1 capsule (60 mg total) by mouth daily, Disp: 60 capsule, Rfl: 2    fluticasone (FLONASE) 50 MCG/ACT nasal spray, 1 spray by Nasal route daily, Disp: 16 g, Rfl: 3    VENTOLIN HFA 108 (90 BASE) MCG/ACT inhaler, Inhale 1-2 puffs into the lungs every 4-6 hours as needed for Wheezing, Disp: 1 Inhaler, Rfl: 5    clonazePAM (KLONOPIN) 0.5 MG tablet, Take 1 tablet (0.5 mg total) by mouth 2 times daily as needed   Max daily dose: 1 mg, Disp: 60 tablet, Rfl: 0    FREESTYLE LITE test strip, Use four times daily as directed for 250.02, Disp: 100 each, Rfl: 11    blood glucose monitor system, Brand: cheapest brand available per her insurance.  Use as directed., Disp: 1 kit, Rfl: 0    lancets, Brand Free Style Lite; Use 2 times per day as directed for blood glucose testing., Disp: 100 each, Rfl: 2    atenolol (TENORMIN) 25 MG tablet, Take 25 mg by mouth daily, Disp: , Rfl:     Alcohol Swabs (ALCOHOL WIPES) PADS, Use BID for BG check, Disp: 100 each, Rfl: 1    famciclovir (FAMVIR) 500 MG tablet, Take 1 tablet (500 mg total) by mouth 3 times daily as needed, Disp:  30 tablet, Rfl: 5    cetirizine (ZYRTEC) 10 MG tablet, Take 10 mg by mouth daily, Disp: , Rfl:     levonorgestrel (MIRENA) 20 MCG/24HR IUD, 1 each by Intrauterine route once, Disp: , Rfl:     Non-System Medication, The above patient is followed in our clinic and cannot resume work permanently., Disp: 1 each, Rfl: 1  Allergies: Morphine; Toradol [ketorolac tromethamine]; Trazodone; Morphine; Toradol [ketorolac tromethamine]; and Trazodone     Medical History:   Past Medical History:   Diagnosis Date    Abscess of abdominal wall 01/18/2014    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 Lower abdomen with  cellulitis changes, wound probed and purulent material expressed.  Had PICC line for "multiple infiltrations" of what? D/c-ed home on 10d of Augmentin 9/11.      Anginal pain     Anxiety     Arthritis     Asthma     Complication of anesthesia     Depression     Diabetes mellitus     Previously on SU and metformin, now diet controlled    Diverticulitis 09/2010    Dysfunctional uterine bleeding     Fibromyalgia     GERD (gastroesophageal reflux disease)     Hyperlipidemia     Migraine     Neuromuscular disorder     Sebaceous cyst of breast     right axilla    Varicella         Surgical History:   Past Surgical History:   Procedure Laterality Date    APPENDECTOMY  2016    arthroscopic shoulder surgery Right 2010    Related to lifting    CARDIAC CATHETERIZATION  09/2011    negative    DILATION AND CURETTAGE OF UTERUS  2005, 2007    x2 for menorraghia    LEFT COLECTOMY  01/03/14    Dr Tresa Res    loop recorder  Feb 03 2014    SMALL INTESTINE SURGERY      TONSILLECTOMY          ROS     Positive for: Eyes ( red double vision eye pain FB sesation right eye   pain with movement)    Negative for: Constitutional, Gastrointestinal, Neurological, Skin,   Genitourinary, Musculoskeletal, HENT, Endocrine, Cardiovascular,   Respiratory, Psychiatric, Allergic/Imm, Heme/Lymph    Last edited by Katherine Roan, MD on 01/22/2016  2:07 PM. (History)      Objective:     Base Eye Exam     Visual Acuity (Snellen - Linear)      Right Left   Dist cc 20/400 +1 20/40 +2       Correction:  Glasses      Tonometry (Tonopen, 11:25 AM)      Right Left   Pressure 12 13         Pupils      Pupils APD   Right PERRLA None   Left PERRLA None         Visual Fields (Counting fingers)      Left Right   Result Full    Restrictions  Partial superior temporal, inferior temporal, superior nasal, inferior nasal deficiencies         Neuro/Psych     Oriented x3:  Yes    Mood/Affect:  Normal      Dilation     Right eye:  2.5% Phenylephrine,  1.0% Tropicamide @ 11:47 AM  Slit Lamp and Fundus Exam     External Exam      Right Left    External Normal ocular adnexae, lacrimal gland & drainage, orbits Normal ocular adnexae, lacrimal gland & drainage, orbits      Slit Lamp Exam      Right Left    Lids/Lashes LL ecchymosis  Normal structure & position    Conjunctiva/Sclera temporal Bells Normal bulbar/palpebral, conjunctiva, sclera    Cornea Normal epithelium, stroma, endothelium, tear film Normal epithelium, stroma, endothelium, tear film    Anterior Chamber Clear & deep; no c/f Clear & deep    Iris Normal shape, size, morphology Normal shape, size, morphology    Lens Normal cortex, nucleus, anterior/posterior capsule, clarity Normal cortex, nucleus, anterior/posterior capsule, clarity    Vitreous Clear Clear      Fundus Exam      Right Left    Disc Normal size, appearance, nerve fiber layer     C/D Ratio 0.1     Macula Normal     Vessels Normal     Periphery Normal             Refraction     Wearing Rx      Sphere Cylinder Axis Add   Right +0.50 +0.50 017 +1.50   Left +0.50 +1.50 158 +1.50       Type:  Bifocal      Manifest Refraction      Sphere Cylinder Axis Dist Add Near   Right +0.50 +0.50 40 20/25 +1.50    Left -0.50 +1.25 160 20/20 +1.50 J1                         No annotated images are attached to the encounter.      Assessment/Plan:     1. Traumatic periorbital ecchymosis of right eye          Periorbital trauma  -fell and hit the bathroom on the right side of face 01/19/16  -CT orbit: no orbital fracture  -no intraocular injury  -reassurance that [facility] will improve over time  -RD precautions: Discussed with patient signs and symptoms of retinal detachment including flashes/floaters/dark curtain and to call clinic immediately if notices those symptoms.        Tunnel vision OD  -pt reports tunnel vision after the fall as if looking through a "cardboard roll"   -no change in VF with tangent screen when sitting 1 m vs 2 m  -reassurance      Follow  up:  c general in 2-4 wks to check for VF again and annual eye exam (today's visit was a urgent visit to address after a fall)         Attending:  Patient seen and discussed with Dr. Lorin Mercy    Katherine Roan, MD  PGY4, Ophthalmology  01/22/2016, 2:07 PM  Pager [phone]    I have seen and examined the patient and agree with residents assessment and plan as documented above.    Forde Radon, MD

## 2016-01-22 NOTE — Telephone Encounter (Signed)
F/u with opthalmology.  PCP aware.

## 2016-01-22 NOTE — Telephone Encounter (Signed)
Did not do ED f/u call.  Pt referred to opthalmology.  PCP aware

## 2016-01-23 ENCOUNTER — Telehealth: Payer: Self-pay

## 2016-01-23 NOTE — Telephone Encounter (Signed)
What Medication: Alosetron     Current Dose? 0.5mg  tablet    How Much (30 OR 90 DAYS)? This was not provided on fax, just states that the medication is too expensive for pt, Can we get an Alternative    Confirm Pharmacy. [facility]

## 2016-01-23 NOTE — Telephone Encounter (Signed)
Pharmacy notified, that per recent office notes and encounters, Dr. Laurance Flatten would not be sending in an alternative and has made other medication changes in it's place. Provider aware that patient cannot afford medication.     Pharmacy representative confirmed that this medication will be cancelled.

## 2016-01-23 NOTE — Telephone Encounter (Signed)
Pt had f/u with opthalmology on 01/22/16

## 2016-01-24 LAB — EKG 12-LEAD: Rate: 87 {beats}/min

## 2016-01-26 ENCOUNTER — Telehealth: Payer: Self-pay | Admitting: Primary Care

## 2016-01-27 ENCOUNTER — Other Ambulatory Visit: Payer: Self-pay | Admitting: Family Medicine

## 2016-01-27 ENCOUNTER — Other Ambulatory Visit: Payer: Self-pay | Admitting: Psychiatry

## 2016-01-27 ENCOUNTER — Encounter: Payer: Self-pay | Admitting: Psychiatry

## 2016-01-27 ENCOUNTER — Other Ambulatory Visit: Payer: Self-pay | Admitting: Primary Care

## 2016-01-28 NOTE — Telephone Encounter (Signed)
[facility] / PRACTICE CALL NOTE    Date and Time of Call: 01/26/16 10pm    PCP:  [name], MD     Problem: withdrawal from psych meds    Pt  lamictal and buspirone recently reduced (lamictal from 200 to 100mg  daily and buspirone from 30mg  BID to 15mg  BID) in effort to see if this was contributing to abd pain. Pt describes worsening depression and passive SI. Wondering if this could be sign of withdrawal or inappropriate dosing. Hasn't had these thoughts in a while. With her wife [name], who has control of her meds.     Impression and Plan: acute passive SI in medically and psychologically complex female. Encouraged to increase buspirone dose back up to 30mg  BID. Encouraged to maintain lamictal at current dosing. F/u with psych next week. Has appropriate safety measures in place; wife [name] is actively involved, 24/7 supervision.         The patient is instructed to call back if patient is having worsening symptoms or lack of improvement of current symptoms with the above treatment, and patient expressed understanding.    [name], MD

## 2016-01-29 ENCOUNTER — Other Ambulatory Visit: Payer: Self-pay | Admitting: Psychiatry

## 2016-01-29 MED ORDER — VENTOLIN HFA 108 (90 BASE) MCG/ACT IN AERS
1.0000 | INHALATION_SPRAY | RESPIRATORY_TRACT | 5 refills | Status: DC | PRN
Start: 2016-01-29 — End: 2016-05-14

## 2016-01-29 MED ORDER — ARIPIPRAZOLE 15 MG PO TABS *I*
ORAL_TABLET | ORAL | 2 refills | Status: DC
Start: 2016-01-29 — End: 2016-03-04

## 2016-01-30 ENCOUNTER — Other Ambulatory Visit: Payer: Self-pay | Admitting: Primary Care

## 2016-02-05 ENCOUNTER — Ambulatory Visit: Payer: Self-pay

## 2016-02-05 ENCOUNTER — Ambulatory Visit: Payer: Self-pay | Admitting: Psychiatry

## 2016-02-05 ENCOUNTER — Encounter: Payer: Self-pay | Admitting: Psychiatry

## 2016-02-05 VITALS — BP 126/74 | HR 75 | Resp 18 | Ht 67.99 in | Wt 283.0 lb

## 2016-02-05 DIAGNOSIS — F331 Major depressive disorder, recurrent, moderate: Secondary | ICD-10-CM

## 2016-02-05 DIAGNOSIS — F339 Major depressive disorder, recurrent, unspecified: Secondary | ICD-10-CM

## 2016-02-05 DIAGNOSIS — F603 Borderline personality disorder: Secondary | ICD-10-CM

## 2016-02-05 DIAGNOSIS — F422 Mixed obsessional thoughts and acts: Secondary | ICD-10-CM

## 2016-02-05 NOTE — Progress Notes (Signed)
Behavioral Health Psychopharmacology Follow-up     Length of Session: 20 minutes.    Diagnosis Addressed    ICD-10-CM ICD-9-CM   1. 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 Mixed obsessional thoughts and acts F42.2 300.3   3. PTSD (post-traumatic stress disorder) F43.10 309.81   4. Borderline personality disorder F60.3 301.83       Recent History and Response to Medications  Patient states:   HPI     Follow-up    Additional comments: patient is here to follow up on her medications       Last edited by Bartosch, Thomas, LPN on 02/05/2016  1:53 PM. (History)          Current use of alcohol or drugs: No        Neurovegetative Symptoms Review:  Energy level: poor  Concentration: poor  Sleep Quality: poor   Number of hours :  (5)  Appetite: fair     Wt Readings from Last 3 Encounters:   02/05/16 128.4 kg (283 lb)   01/21/16 127.5 kg (281 lb)   01/18/16 130.3 kg (287 lb 3.2 oz)     Enjoyment/interest: fair        Current Medications  Current Outpatient Prescriptions   Medication Sig   • ARIPiprazole (ABILIFY) 15 MG tablet Take 1/2 tablet daily in the morning.   • traMADol (ULTRAM) 50 MG tablet Take 1 tablet (50 mg total) by mouth every 8 hours as needed (for head pain and abd pain)   Max daily dose: 150 mg   • lamoTRIgine (LAMICTAL) 100 MG tablet Take 1 tablet (100 mg total) by mouth daily   • busPIRone (BUSPAR) 30 MG tablet Take 1/2 tablet twice daily   • mirtazapine (REMERON) 15 MG tablet Take 1 tablet (15 mg total) by mouth nightly   • ascorbic acid (VITAMIN C) 100 MG tablet Take 100 mg by mouth daily   • midodrine (PROAMATINE) 10 MG tablet Take 1 tablet (10 mg total) by mouth 3 times daily   • dicyclomine (BENTYL) 20 MG tablet Take 1 tablet (20 mg total) by mouth 4 times daily (before meals and nightly)   • insulin syringe-needle U-100 (BD ULTRAFINE) 31G X 5/16" 0.3 ML HALF-UNIT Use 3 times a day as instructed.   • diphenoxylate-atropine (LOMOTIL) 2.5-0.025 MG per tablet Take 1 tablet by mouth 4 times  daily as needed for Diarrhea   Max daily dose: 4 tablets   • topiramate (TOPAMAX) 100 MG tablet Take 1 tablet (100 mg total) by mouth 2 times daily   • pantoprazole (PROTONIX) 40 MG EC tablet Take 1 tablet (40 mg total) by mouth daily   SWALLOW WHOLE. DO NOT CRUSH, BREAK, OR CHEW.   • atorvastatin (LIPITOR) 40 MG tablet Take 1 tablet (40 mg total) by mouth daily (with dinner)   • Insulin Disposable Pump (V-GO 30) KIT Use daily; 30ct/month.   • insulin lispro (HUMALOG) 100 UNIT/ML injection vial For VGO: Dinner 12U (6 clicks) ; B & L 4- 6 units. BG 100 - 150 8u (4clk), 151 - 200 10U (5clk), >201 12U ( 6 ck). MDD 72U (36 clk)   • TRULICITY 0.75 MG/0.5ML SOPN INJECT 0.75 MG INTO THE SKIN ONCE A WEEK   • DULoxetine (CYMBALTA) 60 MG capsule Take 1 capsule (60 mg total) by mouth daily   • fluticasone (FLONASE) 50 MCG/ACT nasal spray 1 spray by Nasal route daily   • FREESTYLE LITE test strip Use four   times daily as directed for 250.02    blood glucose monitor system Brand: cheapest brand available per her insurance.  Use as directed.    lancets Brand Free Style Lite; Use 2 times per day as directed for blood glucose testing.    atenolol (TENORMIN) 25 MG tablet Take 25 mg by mouth daily    Alcohol Swabs (ALCOHOL WIPES) PADS Use BID for BG check    famciclovir (FAMVIR) 500 MG tablet Take 1 tablet (500 mg total) by mouth 3 times daily as needed    cetirizine (ZYRTEC) 10 MG tablet Take 10 mg by mouth daily    levonorgestrel (MIRENA) 20 MCG/24HR IUD 1 each by Intrauterine route once    Non-System Medication The above patient is followed in our clinic and cannot resume work permanently.    VENTOLIN HFA 108 (90 BASE) MCG/ACT inhaler Inhale 1-2 puffs into the lungs every 4-6 hours as needed for Wheezing    promethazine (PHENERGAN) 12.5 MG tablet Take 1 tablet (12.5 mg total) by mouth 4 times daily as needed    SUMAtriptan (IMITREX) 50 MG tablet Take 1 tablet (50 mg total) by mouth as needed for Migraine   Take at onset  of headache. May repeat once in 2 hours.    clonazePAM (KLONOPIN) 0.5 MG tablet Take 1 tablet (0.5 mg total) by mouth 2 times daily as needed   Max daily dose: 1 mg     No current facility-administered medications for this visit.        Side Effects  Patient Reported Side Effects: None reported    Mental Status  APPEARANCE: Casual  ATTITUDE TOWARD INTERVIEWER: Cooperative  MOTOR ACTIVITY: WNL (within normal limits)  EYE CONTACT: Direct  SPEECH: Normal rate and tone  AFFECT: Full Range and Appropriate  MOOD: Anxious and Depressed  THOUGHT PROCESS: Normal  THOUGHT CONTENT: No unusual themes  PERCEPTION: Within normal limits  ORIENTATION: Alert and Oriented X 3.  CONCENTRATION: Fair  MEMORY:   Recent: intact   Remote: intact  COGNITIVE FUNCTION: Average intelligence  JUDGMENT: Intact  IMPULSE CONTROL: Poor "Eating junk food that I'm not supposed to be eating."  INSIGHT: Fair    Risk Assessment    Self Injury: Patient Denies  Suicidal Ideation: Yes. Describe: Patient denies any planning or intent but says she has thoughts of "not wanting to be here anymore."  Her wife has her medications locked up so she does not have access.  Homicidal Ideation: Patient Denies  Aggressive Behavior: Patient Denies    Results  Results for Kaylee Harris, Kaylee Harris (MRN [id number]) as of 02/05/2016 14:11   Ref. Range 12/27/2015 15:23   Cholesterol Latest Units: mg/dL 154   Triglycerides Latest Units: mg/dL 304 (!)   HDL Latest Units: mg/dL 33   LDL Calculated Latest Units: mg/dL 60   Non HDL Cholesterol Latest Units: mg/dL 121   Chol/HDL Ratio Unknown 4.7     Results for Kaylee Harris, Kaylee Harris (MRN [id number]) as of 02/05/2016 14:11   Ref. Range 12/27/2015 15:23   Hemoglobin A1C Latest Ref Range: 4.0 - 6.0 % 7.3 (H)   Patient was already aware of the results.   BP Readings from Last 3 Encounters:   02/05/16 126/74   01/22/16 107/77   01/21/16 (!) 148/93       Assessment  FORMULATION: Patient has been very depressed and is now having some suicidal ideation since her  lamictal and Buspar were decreased.  The Lamictal was decreased due to the pain in  her since but the patient says it is still present even with the lower dose.  She wants to increase it back to the 150 mg since that worked better for her.  She increased the Buspar back to 30 mg bid last week and has noticed "I'm not as depressed."  But she says "i'm still crying like crazy."  Writer agreed to increase the dose back to 150 mg for the Lamictal.      Recommendations/Plan and Rationale  PLAN: Increase Lamictal back to 150 mg daily.  Continue other medications.  Writer will route note to PCP so she is aware.

## 2016-02-05 NOTE — Progress Notes (Signed)
Behavioral Health Progress Note     LENGTH OF SESSION: 45 minutes    Contact Type:  Location: On Site    Face to Face     Problem(s)/Goals Addressed from Treatment Plan:    Problem 1:   Treatment Problem #1 01/10/2016   Patient Identified Problem Depression and anxiety       Goal for this problem:    Treatment Goal #1 01/10/2016   Patient Identified Goal Learn DBT and PST skills to manage affect       Progress towards this goal: Problem resolving. Comment: Patient reports a return to mood stability.    Mental Status Exam:  APPEARANCE: Appears stated age, Well-groomed, Casual  ATTITUDE TOWARD INTERVIEWER: Cooperative  MOTOR ACTIVITY: WNL (within normal limits)  EYE CONTACT: Direct and Avoidant  SPEECH: Normal rate and tone  AFFECT: Full Range  MOOD: Anxious and Depressed  THOUGHT PROCESS: Circumstantial  THOUGHT CONTENT: Negative Rumination  PERCEPTION: No evidence of hallucinations  CURRENT SUICIDAL IDEATION: patient denies  CURRENT HOMICIDAL IDEATION: Patient denies  ORIENTATION: Alert and Oriented X 3.  CONCENTRATION: Good  MEMORY:   Recent: intact   Remote: intact  COGNITIVE FUNCTION: Average intelligence  JUDGMENT: Intact  IMPULSE CONTROL: Fair  INSIGHT: Fair    Risk Assessment:  ASSESSMENT OF RISK FOR SUICIDAL BEHAVIOR  Changes in risk for suicide from baseline Formulation of Risk and/or previous intake, including newly identified risk, if any: none    Violence risk was assessed and No Change noted from baseline formulation of risk and/or previous assessment.    Session Content:: Patient reports a return to mood stability. She said that she has resumed her previous medication dosage and this is what made a difference. Patient told Probation officer about a situation involving her brother, sister and her having plans to go out to dinner but she is not sure if her parents were invited. She said that this causes her distress because she doesn't want her parents to feel excluded. Writer and patient discussed when this has  happened to patient in the past and patient's need to be a caretaker for her family's emotional needs. Patient told writer that her spouse revealed that her multiple sclerosis symptoms have worsened. She said that she has a great deal of concern about this, but doesn't have anyone to talk to about this. Patient said that her sister is unable to listen and will make "everything about her". Writer validated patient's thoughts and feelings. Writer talked to patient about seeking out online support groups for spouses and patient said that she will look into this.     Visit Diagnosis:    ICD-10-CM ICD-9-CM   1. MDD (major depressive disorder), recurrent episode F33.9 296.30       Interventions:  Supportive Psychotherapy, Taught/practiced coping skills (specify skills used):  setting limits within relationships, Solution Focused therapy    Current Treatment Plan   Created/Updated On 01/10/2016   Next Treatment Plan Due 04/09/2016     Plan:  Psychotherapy continues as described in care plan; plan remains the same.    NEXT APPT: 02/22/16 @2     Tresa Res, LCSW

## 2016-02-06 ENCOUNTER — Ambulatory Visit: Payer: Self-pay | Admitting: Ophthalmology

## 2016-02-06 VITALS — BP 153/78 | HR 78

## 2016-02-06 DIAGNOSIS — H5310 Unspecified subjective visual disturbances: Secondary | ICD-10-CM

## 2016-02-06 NOTE — Progress Notes (Addendum)
Outpatient Visit      Patient name: Kaylee Harris  DOB: 01/11/1976       Age: 40 y.o.  MR#: [id number]    Encounter Date: 02/06/2016    Subjective:     Chief Complaint:   Chief Complaint   Patient presents with    Eye Problem     2 wk f/u Traumatic periorbital ecchymosis of right eye.      HPI     Eye Problem    Additional comments: 2 wk f/u Traumatic periorbital ecchymosis of right   eye.            Comments   40 yo F with history of traumatic periorbital ecchymosis and   subconjunctival hemorrhage OD who presents for follow up. Patient reports   that she still has a little bit of tunnel vision, but much improved since   last visit. Still seeing old floaters that she first noted after the   trauma.       Last edited by Kandyce Rud, MD on 02/06/2016  2:09 PM. (History)          Current Outpatient Prescriptions:     VENTOLIN HFA 108 (90 BASE) MCG/ACT inhaler, Inhale 1-2 puffs into the lungs every 4-6 hours as needed for Wheezing, Disp: 1 Inhaler, Rfl: 5    ARIPiprazole (ABILIFY) 15 MG tablet, Take 1/2 tablet daily in the morning., Disp: 15 tablet, Rfl: 2    traMADol (ULTRAM) 50 MG tablet, Take 1 tablet (50 mg total) by mouth every 8 hours as needed (for head pain and abd pain)   Max daily dose: 150 mg, Disp: 42 tablet, Rfl: 0    lamoTRIgine (LAMICTAL) 100 MG tablet, Take 1 tablet (100 mg total) by mouth daily (Patient taking differently: Take 150 mg by mouth daily   ), Disp: 30 tablet, Rfl: 0    busPIRone (BUSPAR) 30 MG tablet, Take 1/2 tablet twice daily, Disp: 30 tablet, Rfl: 2    mirtazapine (REMERON) 15 MG tablet, Take 1 tablet (15 mg total) by mouth nightly, Disp: 30 tablet, Rfl: 2    ascorbic acid (VITAMIN C) 100 MG tablet, Take 100 mg by mouth daily, Disp: , Rfl:     midodrine (PROAMATINE) 10 MG tablet, Take 1 tablet (10 mg total) by mouth 3 times daily, Disp: 90 tablet, Rfl: 0    dicyclomine (BENTYL) 20 MG tablet, Take 1 tablet (20 mg total) by mouth 4 times daily (before meals and nightly),  Disp: 120 tablet, Rfl: 5    insulin syringe-needle U-100 (BD ULTRAFINE) 31G X 5/16" 0.3 ML HALF-UNIT, Use 3 times a day as instructed., Disp: 100 each, Rfl: 0    diphenoxylate-atropine (LOMOTIL) 2.5-0.025 MG per tablet, Take 1 tablet by mouth 4 times daily as needed for Diarrhea   Max daily dose: 4 tablets, Disp: 56 tablet, Rfl: 0    promethazine (PHENERGAN) 12.5 MG tablet, Take 1 tablet (12.5 mg total) by mouth 4 times daily as needed, Disp: 30 tablet, Rfl: 0    topiramate (TOPAMAX) 100 MG tablet, Take 1 tablet (100 mg total) by mouth 2 times daily, Disp: 60 tablet, Rfl: 5    SUMAtriptan (IMITREX) 50 MG tablet, Take 1 tablet (50 mg total) by mouth as needed for Migraine   Take at onset of headache. May repeat once in 2 hours., Disp: 9 tablet, Rfl: 5    pantoprazole (PROTONIX) 40 MG EC tablet, Take 1 tablet (40 mg total) by mouth daily   SWALLOW  WHOLE. DO NOT CRUSH, BREAK, OR CHEW., Disp: 30 tablet, Rfl: 5    atorvastatin (LIPITOR) 40 MG tablet, Take 1 tablet (40 mg total) by mouth daily (with dinner), Disp: 30 tablet, Rfl: 5    Insulin Disposable Pump (V-GO 30) KIT, Use daily; 30ct/month., Disp: 30 kit, Rfl: 11    insulin lispro (HUMALOG) 100 UNIT/ML injection vial, For VGO: Dinner 67E (6 clicks) ; B & L 4- 6 units. BG 100 - 150 8u (4clk), 151 - 200 10U (5clk), >201 12U ( 6 ck). MDD 72U (36 clk), Disp: 10 mL, Rfl: 0    TRULICITY 9.38 BO/1.7PZ SOPN, INJECT 0.75 MG INTO THE SKIN ONCE A WEEK, Disp: 2 mL, Rfl: 5    DULoxetine (CYMBALTA) 60 MG capsule, Take 1 capsule (60 mg total) by mouth daily, Disp: 60 capsule, Rfl: 2    fluticasone (FLONASE) 50 MCG/ACT nasal spray, 1 spray by Nasal route daily, Disp: 16 g, Rfl: 3    clonazePAM (KLONOPIN) 0.5 MG tablet, Take 1 tablet (0.5 mg total) by mouth 2 times daily as needed   Max daily dose: 1 mg, Disp: 60 tablet, Rfl: 0    FREESTYLE LITE test strip, Use four times daily as directed for 250.02, Disp: 100 each, Rfl: 11    blood glucose monitor system, Brand:  cheapest brand available per her insurance.  Use as directed., Disp: 1 kit, Rfl: 0    lancets, Brand Free Style Lite; Use 2 times per day as directed for blood glucose testing., Disp: 100 each, Rfl: 2    atenolol (TENORMIN) 25 MG tablet, Take 25 mg by mouth daily, Disp: , Rfl:     Alcohol Swabs (ALCOHOL WIPES) PADS, Use BID for BG check, Disp: 100 each, Rfl: 1    famciclovir (FAMVIR) 500 MG tablet, Take 1 tablet (500 mg total) by mouth 3 times daily as needed, Disp: 30 tablet, Rfl: 5    cetirizine (ZYRTEC) 10 MG tablet, Take 10 mg by mouth daily, Disp: , Rfl:     levonorgestrel (MIRENA) 20 MCG/24HR IUD, 1 each by Intrauterine route once, Disp: , Rfl:     Non-System Medication, The above patient is followed in our clinic and cannot resume work permanently., Disp: 1 each, Rfl: 1  Allergies: Morphine; Toradol [ketorolac tromethamine]; Trazodone; Morphine; Toradol [ketorolac tromethamine]; and Trazodone     Medical History:   Past Medical History:   Diagnosis Date    Abscess of abdominal wall 01/18/2014    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 Lower abdomen with cellulitis changes, wound probed and purulent material expressed.  Had PICC line for "multiple infiltrations" of what? D/c-ed home on 10d of Augmentin 9/11.      Anginal pain     Anxiety     Arthritis     Asthma     Complication of anesthesia     Depression     Diabetes mellitus     Previously on SU and metformin, now diet controlled    Diverticulitis 09/2010    Dysfunctional uterine bleeding     Fibromyalgia     GERD (gastroesophageal reflux disease)     Hyperlipidemia     Migraine     Neuromuscular disorder     Sebaceous cyst of breast     right axilla    Varicella         Surgical History:   Past Surgical History:   Procedure Laterality Date    APPENDECTOMY  2016    arthroscopic  shoulder surgery Right 2010    Related to lifting    CARDIAC CATHETERIZATION  09/2011    negative    DILATION AND CURETTAGE OF UTERUS   2005, 2007    x2 for menorraghia    LEFT COLECTOMY  01/03/14    Dr Tresa Res    loop recorder  Feb 03 2014    SMALL INTESTINE SURGERY      TONSILLECTOMY          ROS     Positive for: Eyes    Negative for: Constitutional, Gastrointestinal, Neurological, Skin,   Genitourinary, Musculoskeletal, HENT, Endocrine, Cardiovascular,   Respiratory, Psychiatric, Allergic/Imm, Heme/Lymph    Last edited by Kandyce Rud, MD on 02/06/2016  2:05 PM. (History)      Objective:     Base Eye Exam     Visual Acuity (Snellen - Linear)      Right Left   Dist cc 20/20 20/20-2   Near cc J1+ J1+       Correction:  Glasses      Tonometry (Tonopen, 2:00 PM)      Right Left   Pressure 12 9         Pupils      Pupils   Right PERRLA   Left PERRLA         Visual Fields      Left Right   Result Full Full         Extraocular Movement      Right Left   Result Full Full         Neuro/Psych     Oriented x3:  Yes    Mood/Affect:  Normal            Slit Lamp and Fundus Exam     External Exam      Right Left    External Normal ocular adnexae, lacrimal gland & drainage, orbits Normal ocular adnexae, lacrimal gland & drainage, orbits      Slit Lamp Exam      Right Left    Lids/Lashes Normal structure & position Normal structure & position    Conjunctiva/Sclera Normal bulbar/palpebral, conjunctiva, sclera Normal bulbar/palpebral, conjunctiva, sclera    Cornea Normal epithelium, stroma, endothelium, tear film Normal epithelium, stroma, endothelium, tear film    Anterior Chamber Clear & deep; no c/f Clear & deep    Iris Normal shape, size, morphology Normal shape, size, morphology    Lens Normal cortex, nucleus, anterior/posterior capsule, clarity Normal cortex, nucleus, anterior/posterior capsule, clarity    Vitreous Clear Clear            Refraction     Wearing Rx      Sphere Cylinder Axis Add   Right +0.50 +0.50 017 +1.50   Left +0.50 +1.50 158 +1.50       Age:  >72yr   Type:  bifocal                      No annotated images are attached to the  encounter.      Assessment/Plan:     Assessment       Periorbital trauma  -F[place]en Circleand hit the bathroom on the right side of face 01/19/16  -CT orbit: no orbital fracture  -Ecchymosis and subconjunctival hemorrhage now resolved  -Patient aware of RD precautions: Discussed with patient signs and symptoms of retinal detachment including flashes/floaters/dark curtain and to call clinic immediately if notices those symptoms.  Tunnel vision OD  -Pt reports tunnel vision after the fall as if looking through a "cardboard roll"   -Subjectively much improved since last visit  -No change/increased constriction in VF with tangent screen when sitting 1 m vs 2 m  -Reassurance     Follow up in 2 months, patient would like complete eye exam at that time      Patient seen and discussed with Dr. Lorin Mercy    Kandyce Rud, MD (PGY2)  Ophthalmology  02/06/2016  2:32 PM  I have seen and examined the patient and agree with residents assessment and plan as documented above.    Forde Radon, MD

## 2016-02-07 ENCOUNTER — Encounter: Payer: Self-pay | Admitting: Registered Nurse

## 2016-02-14 ENCOUNTER — Encounter: Payer: Self-pay | Admitting: Registered Nurse

## 2016-02-15 ENCOUNTER — Telehealth: Payer: Self-pay | Admitting: Gastroenterology

## 2016-02-15 NOTE — Telephone Encounter (Signed)
Pre procedure call made to Kaylee Harris for colonoscopy scheduled on 02/16/2016 with Dr. Cherylynn Ridges. Informed patient to arrive at department at 1245 AM             The following information was confirmed:     1. Patient's arrival time and date   2. Parking information/location given to patient  3. Directions to department given  4. Patient to bring the following: Insurance card, Photo ID, Pre procedure medication list, Health History form  5. Any further questions, instructed to reach [place] at [phone], Monday thru Friday, 7:30am to 5:00pm.      Your driver that will stay with you and drive you home when discharged. Driver:Friend    6. Does patient have a pacemaker?  No      7. Does patient take any blood thinners, blood pressure medication or insulin? Yes, Patient instructed that if their doctor did not give them special instructions regarding how much and when to take these medications, they should contact their doctor immediately for instructions.Marland Kitchen

## 2016-02-16 ENCOUNTER — Encounter
Admission: RE | Disposition: A | Discharge status: Home / Self Care | Payer: Self-pay | Source: Ambulatory Visit | Attending: Gastroenterology

## 2016-02-16 ENCOUNTER — Ambulatory Visit
Admission: RE | Admit: 2016-02-16 | Discharge: 2016-02-16 | Disposition: A | Discharge status: Home / Self Care | Payer: Medicare (Managed Care) | Source: Ambulatory Visit | Attending: Gastroenterology | Admitting: Gastroenterology

## 2016-02-16 DIAGNOSIS — K648 Other hemorrhoids: Secondary | ICD-10-CM | POA: Insufficient documentation

## 2016-02-16 HISTORY — DX: Postural orthostatic tachycardia syndrome (POTS): G90.A

## 2016-02-16 HISTORY — PX: PR COLONOSCOPY THRU COLOTOMY: 45355

## 2016-02-16 LAB — POCT GLUCOSE: Glucose POCT: 108 mg/dL — ABNORMAL HIGH (ref 60–99)

## 2016-02-16 SURGERY — COLONOSCOPY

## 2016-02-16 MED ORDER — FENTANYL CITRATE 50 MCG/ML IJ SOLN *WRAPPED*
INTRAMUSCULAR | Status: AC
Start: 2016-02-16 — End: 2016-02-16
  Filled 2016-02-16: qty 2

## 2016-02-16 MED ORDER — MIDAZOLAM HCL 5 MG/5ML IJ SOLN *I*
INTRAMUSCULAR | Status: AC
Start: 2016-02-16 — End: 2016-02-16
  Filled 2016-02-16: qty 10

## 2016-02-16 MED ORDER — SODIUM CHLORIDE 0.9 % IV SOLN WRAPPED *I*
50.0000 mL/h | Status: DC
Start: 2016-02-16 — End: 2016-02-16
  Administered 2016-02-16: 50 mL/h via INTRAVENOUS

## 2016-02-16 MED ORDER — FENTANYL CITRATE 50 MCG/ML IJ SOLN *WRAPPED*
INTRAMUSCULAR | Status: AC | PRN
Start: 2016-02-16 — End: 2016-02-16
  Administered 2016-02-16: 100 ug via INTRAVENOUS

## 2016-02-16 MED ORDER — MIDAZOLAM HCL 5 MG/5ML IJ SOLN *I*
INTRAMUSCULAR | Status: AC | PRN
Start: 2016-02-16 — End: 2016-02-16
  Administered 2016-02-16: 2 mg via INTRAVENOUS
  Administered 2016-02-16: 1 mg via INTRAVENOUS
  Administered 2016-02-16 (×2): 2 mg via INTRAVENOUS

## 2016-02-16 SURGICAL SUPPLY — 19 items
BINDER ABD POS COLONOSCOPY REG 100CM (Supply) IMPLANT
CLIP HEMOSTASIS ENDO INSTINCT 7FRX230CM (Supply) IMPLANT
COLOWRAP LARGE (Supply) IMPLANT
COLOWRAP REGULAR (Supply)
ELECTRODE ADULT POLYHESIVE PAT RETURN (Supply) IMPLANT
FORCEPS BIOPSY RADIAL JAW LG (Other) ×2 IMPLANT
FORCEPS DISPOSABLE ALLIGATOR JAW W/NEEDLE (Supply) IMPLANT
PROBE CIRCUMFERENTAIL FIRE (Supply) IMPLANT
PROBE COAG 2000A 6.9FR L2.2M INTEGR STYL W/ FLTR FOR FLX ENDOSCP INTERVENTION FIAPC (Supply) IMPLANT
PROBE COAG 6.9FR L2.2M CNVNENT BLT IN FLTR 2200SC SIMPLIFIED SETUP PLUG PLAY FUNCTIONALITY (Supply) IMPLANT
PROBE SIDE FIRE (Supply)
PROBE STRAIGHT FIRE (Supply)
SNARE MICRO MINI (Supply)
SNARE MINI SOFT ACU 1.5 X 3 (Supply) IMPLANT
SNARE POLYP 1CMX1.5CM SFT GI MINI MIC OVL ACUSNR (Supply) IMPLANT
SNARE SOFT JUMBO ENDO DISPO (Supply) IMPLANT
SNARE STANDARD SOFT ACU 2.5 X 5.5 (Supply) IMPLANT
TRAP POLYP MULTICHAMBER DISP OPTIMIZER (Other) IMPLANT
TRAPS POLY (Other)

## 2016-02-16 NOTE — Preop H&P (Signed)
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 Lower abdomen with cellulitis changes, wound probed and purulent material expressed.  Had PICC line for "multiple infiltrations" of what? D/c-ed home on 10d of Augmentin 9/11.      Anginal pain     Anxiety     Arthritis     Asthma     Complication of anesthesia     Depression     Diabetes mellitus     Previously on SU and metformin, now diet controlled    Diverticulitis 09/2010    Dysfunctional uterine bleeding     Fibromyalgia     GERD (gastroesophageal reflux disease)     Hyperlipidemia     Migraine     Neuromuscular disorder     POTS (postural orthostatic tachycardia syndrome)     Sebaceous cyst of breast     right axilla    Varicella      Past Surgical History:   Procedure Laterality Date    APPENDECTOMY  2016    arthroscopic shoulder surgery Right 2010    Related to lifting    CARDIAC CATHETERIZATION  09/2011    negative    DILATION AND CURETTAGE OF UTERUS  2005, 2007    x2 for menorraghia    LEFT COLECTOMY  01/03/14    Dr Tresa Res    loop recorder  Feb 03 2014    SMALL INTESTINE SURGERY      TONSILLECTOMY       Family History   Problem Relation Age of Onset    Hypertension Father     Diabetes Father     Kidney Disease Father     Elevated lipids Father     Heart attack Father 33    Other Father      PVD    Hypertension Mother     Elevated lipids Mother     Heart attack Mother 55    Diabetes Mother     Eczema Mother     Psoriasis Mother     Hypertension Brother     Heart Disease Brother 13     prinzmetal's angina    Heart Disease Sister      currently having work up    [place] Sister     Hypertension Sister     Breast cancer Maternal Grandmother     Stroke Other      Social History     Social History    Marital  status: Married     Spouse name: N/A    Number of children: N/A    Years of education: N/A     Social History Main Topics    Smoking status: Former Smoker     Packs/day: 0.50     Years: 3.00     Types: Cigarettes    Smokeless tobacco: Never Used    Alcohol use 0.0 oz/week     0 Standard drinks or equivalent per week      Comment: <1 weekly    Drug use: No    Sexual activity: Yes     Partners: Female     Other Topics Concern    None     Social History Narrative    ** Merged History Encounter **  Married to [employer], recently relocated back to [place] from [place] due to family stressors. 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        Allergies:    Allergies   Allergen Reactions    Morphine Anaphylaxis    Toradol [Ketorolac Tromethamine] Anaphylaxis     Notes feeling tightness in her throat and full body itching after last dose a month ago. Also notes erythema tracking along her vein after injection. This is not reflected by documentation.  Tolerates Naproxen.    Trazodone Anaphylaxis    Morphine Other (See Comments)     unknown    Toradol [Ketorolac Tromethamine] Other (See Comments)     unknown    Trazodone Other (See Comments)     unknown       Medications:  Prescriptions Prior to Admission   Medication Sig    VENTOLIN HFA 108 (90 BASE) MCG/ACT inhaler Inhale 1-2 puffs into the lungs every 4-6 hours as needed for Wheezing    ARIPiprazole (ABILIFY) 15 MG tablet Take 1/2 tablet daily in the morning.    traMADol (ULTRAM) 50 MG tablet Take 1 tablet (50 mg total) by mouth every 8 hours as needed (for head pain and abd pain)   Max daily dose: 150 mg    lamoTRIgine (LAMICTAL) 100 MG tablet Take 1 tablet (100 mg total) by mouth daily (Patient taking differently: Take 150 mg by mouth daily   )    busPIRone (BUSPAR) 30 MG tablet Take 1/2 tablet twice daily    mirtazapine (REMERON) 15 MG tablet Take 1 tablet (15 mg total) by mouth nightly    ascorbic acid  (VITAMIN C) 100 MG tablet Take 100 mg by mouth daily    midodrine (PROAMATINE) 10 MG tablet Take 1 tablet (10 mg total) by mouth 3 times daily    diphenoxylate-atropine (LOMOTIL) 2.5-0.025 MG per tablet Take 1 tablet by mouth 4 times daily as needed for Diarrhea   Max daily dose: 4 tablets    promethazine (PHENERGAN) 12.5 MG tablet Take 1 tablet (12.5 mg total) by mouth 4 times daily as needed    topiramate (TOPAMAX) 100 MG tablet Take 1 tablet (100 mg total) by mouth 2 times daily    SUMAtriptan (IMITREX) 50 MG tablet Take 1 tablet (50 mg total) by mouth as needed for Migraine   Take at onset of headache. May repeat once in 2 hours.    pantoprazole (PROTONIX) 40 MG EC tablet Take 1 tablet (40 mg total) by mouth daily   SWALLOW WHOLE. DO NOT CRUSH, BREAK, OR CHEW.    atorvastatin (LIPITOR) 40 MG tablet Take 1 tablet (40 mg total) by mouth daily (with dinner)    DULoxetine (CYMBALTA) 60 MG capsule Take 1 capsule (60 mg total) by mouth daily    fluticasone (FLONASE) 50 MCG/ACT nasal spray 1 spray by Nasal route daily    clonazePAM (KLONOPIN) 0.5 MG tablet Take 1 tablet (0.5 mg total) by mouth 2 times daily as needed   Max daily dose: 1 mg    atenolol (TENORMIN) 25 MG tablet Take 25 mg by mouth daily    cetirizine (ZYRTEC) 10 MG tablet Take 10 mg by mouth daily    levonorgestrel (MIRENA) 20 MCG/24HR IUD 1 each by Intrauterine route once    dicyclomine (BENTYL) 20 MG tablet Take 1 tablet (20 mg total) by mouth 4 times daily (before meals and nightly)    insulin syringe-needle U-100 (BD ULTRAFINE)  31G X 5/16" 0.3 ML HALF-UNIT Use 3 times a day as instructed.    Insulin Disposable Pump (V-GO 30) KIT Use daily; 30ct/month.    insulin lispro (HUMALOG) 100 UNIT/ML injection vial For VGO: Dinner 32D (6 clicks) ; B & L 4- 6 units. BG 100 - 150 8u (4clk), 151 - 200 10U (5clk), >201 12U ( 6 ck). MDD 92E (36 clk)    TRULICITY 2.68 TM/1.9QQ SOPN INJECT 0.75 MG INTO THE SKIN ONCE A WEEK    FREESTYLE LITE test  strip Use four times daily as directed for 250.02    blood glucose monitor system Brand: cheapest brand available per her insurance.  Use as directed.    lancets Brand Free Style Lite; Use 2 times per day as directed for blood glucose testing.    Alcohol Swabs (ALCOHOL WIPES) PADS Use BID for BG check    famciclovir (FAMVIR) 500 MG tablet Take 1 tablet (500 mg total) by mouth 3 times daily as needed    Non-System Medication The above patient is followed in our clinic and cannot resume work permanently.     Current Facility-Administered Medications   Medication Dose Route Frequency    sodium chloride 0.9 % IV  50 mL/hr Intravenous Continuous     No current outpatient prescriptions on file.     Vitals:    02/16/16 1317   BP: (!) 143/96   Pulse: 87   Resp: 16   Temp: 36.5 C (97.7 F)   Weight: 127.9 kg (282 lb)   Height: 172.7 cm ('5\' 8"' )           Physical Examination:Oropharynx: mucosa pink and moist  Lungs: percussion normal, good diaphragmatic excursion, lungs clear to auscultation bilaterally  Heart: regular rate and rhythm, no murmurs, gallops or rubs  Abdomen: abdomen soft, non-tender, nondistended, normal active bowel sounds, no masses or organomegaly  Neuro: No focal neurology  Extremities/Musculoskeletal: No rash,cyanosis,or edema noted       Last Lab Results:   Lab Results   Component Value Date/Time    WBC 12.0 (H) 01/21/2016 08:27 PM    HCT 39 01/21/2016 08:27 PM    PLT 394 (H) 01/21/2016 08:27 PM    INR 1.0 10/01/2014 06:37 PM    AST 17 01/04/2016 11:08 PM    ALT 18 01/04/2016 11:08 PM    ALK 122 (H) 01/04/2016 11:08 PM    TB 0.4 01/04/2016 11:08 PM    AMY 46 01/04/2016 11:08 PM    LIP 39 01/04/2016 11:08 PM    NA 143 01/21/2016 08:27 PM    K 3.9 01/21/2016 08:27 PM    UN 9 01/21/2016 08:27 PM    CREAT 0.91 01/21/2016 08:27 PM    CREAT 0.78 10/01/2014 06:37 PM         Radiology impressions (last 30 days):  Ct Orbit, Sella, Fossa Without Contrast    Result Date: 01/22/2016  Exam Site: [facility] 01/21/2016 9:55 PM CT ORBITS WITHOUT CONTRAST  ORDERING CLINICAL INFORMATION:  ERECORD: trauma to right eye ADDITIONAL CLINICAL INFORMATION:   Right eye pain and vision changes after a fall 2 days ago.  TECHNIQUE:  CT of the orbits was performed without intravenous contrast.  Axial, coronal and sagittal reformations were acquired.  COMPARISON:  Head CT 07/06/2014  FINDINGS:  GLOBES:  The globes exhibit normal morphology. The lenses are appropriately positioned.  OPTIC NERVE SHEATH COMPLEXES:  Normal in caliber and morphology.  INTRAORBITAL FAT AND VESSELS:  Preserved. No intraorbital mass is identified.  EXTRAOCULAR MUSCLES:  Normal attenuation and caliber.  LACRIMAL GLANDS:  Symmetric and normal in size.  VISUALIZED PARANASAL SINUSES AND MASTOID AIR CELLS:  Clear.  BONES AND SOFT TISSUE:  Soft tissue infiltrative changes overlying the bone of the right zygomatic process without evidence of underlying bony fracture. There is minimal questionable buckle at the sphenoethmoidal zygomatic suture on the right (series 201, image 74), but this is stable from previous study, and does not represent an acute finding.      IMPRESSION:   Soft tissue contusion overlying the right zygomatic process. No acute osseous changes.   I have personally reviewed the image(s) and the resident's interpretation and agree with or edited the findings.      Currently Active Problems:  Patient Active Problem List   Diagnosis Code    DM (diabetes mellitus), type 2, uncontrolled E11.65    Migraine with aura G43.109    Asthma J45.909    Hyperlipemia E78.5    Fibromyalgia M79.7    Syncope and collapse- recurrent, working diagnosis vascular vs psychogenic. Non-arrythmic per Dr Lovena Le 10/2014 R55    Depression, major, recurrent, moderate F33.1    IUD (intrauterine device) in place Z97.5    Skin lesion of face L98.9    IBS (irritable bowel syndrome) K58.9    Anxiety F41.9    Mixed obsessional thoughts and acts F42.2     Carpal tunnel syndrome G56.00    Meralgia paresthetica of left side G57.12    No diabetic retinopathy in both eyes Z03.89    At risk for abuse of opiates Z91.89    History of repeated overdose Z91.5    Borderline personality disorder F60.3    Neck pain M54.2    Fatty liver disease, nonalcoholic H84.6        Impression:  Diarrhea    Plan:  Colonoscopy  Moderate Sedation    UPDATES TO PATIENT'S CONDITION on the DAY OF SURGERY/PROCEDURE    I. Updates to Patient's Condition (to be completed by a provider privileged to complete a H&P, following reassessment of the patient by the provider):    Full H&P done today; no updates needed.    II. Procedure Readiness   I have reviewed the patient's H&P and updated condition. By completing and signing this form, I attest that this patient is ready for surgery/procedure.      III. Attestation   I have reviewed the updated information regarding the patient's condition and it is appropriate to proceed with the planned surgery/procedure.    Kelly Splinter, MD as of 1:24 PM 02/16/2016

## 2016-02-16 NOTE — Discharge Instructions (Signed)
[facility]  Discharge Instructions  For Colonoscopy    02/16/2016    1:28 PM      Findings:Normal exam biopsies taken    Do not drive, operate heavy machinery, drink alcoholic beverages, make important personal or business decisions, or sign legal documents until next day.     Return to your usual diet.    Things you May Expect:   A small amount of bright red blood in your stool.   It may be a few days before you have a bowel movement.   You may have cramping, bloating, and feeling of "gas". These feelings should go away as you pass gas. If you still feel uncomfortable, walking around will help to pass the gas.   You were given medication to help you relax during the test. You may feel "fuzzy" and drowsy. Go home and rest for at least 4-6 hours.    You Should Call You Doctor For any of the Following:     Bad stomach pain    Fever    Bright red bleeding or clots (This may happen up to 2 weeks after the test.)    Dizziness or weakness that gets worse or last up to 24 hours.    Pain or redness at the IV site  If you have a serious problem after hours, Call [phone] to reach the GI physician on call. If you are unable to reach your doctor, go to the [facility] Emergency Department.    Follow Up Care:  If biopsies were taken during your procedure, we will send you the pathology results within 14 days. If you do not receive your pathology results after 14 days please call [phone].    Results will be sent to your primary care doctor.    Repeat colonoscopy at age 42.    Follow up in clinic in 4-6 weeks- PA.          Provider Signature________________________________________________________

## 2016-02-16 NOTE — Procedures (Signed)
Colonoscopy Procedure Note     Date of Procedure: 02/16/2016   Primary Physician: [name], MD        Attending Physician: Kelly Splinter, M.D.      Indications: Diarrhea  Previous colonoscopy: No  Medications:Fentanyl 100 mcg IV and Midazolam 7 mg IV were administered incrementally over the course of the procedure to achieve an adequate level of conscious sedation.     Informed Consent: Prior to the procedure, the patient was explained the risk, benefits and alternatives including the risk of bleeding, infection or perforation and informed consent was obtained.     Procedure Details: The patient was placed in the left lateral decubitus position and monitored continuously with ECG tracing, pulse oximetry, blood pressure monitoring and direct observations. After anorectal examination was performed, the Olympus CF-H180was inserted into the rectum and advanced under direct vision to the cecum, which was identified by  the ileocecal valve and the appendiceal orifice. The procedure was considered not difficult..    Narrative:    During withdrawal examination, the final quality of the prep was [facility] Bowel Prep Scale   Prep:    Right Colon: Grade3- (entire mucosa of colon segment seen well, with no residual staining, small fragments of stool, or opaque liquid)    Transverse Colon: Grade 3- (entire mucosa of colon segment seen well, with no residual staining, small fragments of stool, or opaque liquid)    Left Colon: Grade 3- (entire mucosa of colon segment seen well, with no residual staining, small fragments of stool, or opaque liquid)    A careful inspection was made as the colonoscope was withdrawn, a retroflexed view of the rectum was included; findings and interventions are described below. Appropriate photo documentation wasobtained. The patient  recovered in the [employer] [facility].    Findings:     Terminal Ileum: Normal. No polyps, ulcers or inflammation identified.      Colon: 1. Normal. No  polyps, ulcers or inflammation identified.  Cold biopsied and sent to pathology.   2. Medium sized internal hemorrhoids.       Complications: The patient tolerated the procedure well.    Impression: 1. Normal exam- no source for diarrhea identified.    Recommendations: 1. Await pathology to rule out microscopic colitis.  2. Repeat colonoscopy at age 77.  3. Follow up in clinic in 6 weeks.       Kelly Splinter, MD  Cell: [phone]  Office Phone # [phone]

## 2016-02-19 ENCOUNTER — Encounter: Payer: Self-pay | Admitting: Gastroenterology

## 2016-02-19 LAB — SURGICAL PATHOLOGY

## 2016-02-22 ENCOUNTER — Ambulatory Visit: Payer: Self-pay

## 2016-02-22 ENCOUNTER — Telehealth: Payer: Self-pay
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
## 2016-02-28 ENCOUNTER — Other Ambulatory Visit: Payer: Self-pay

## 2016-02-28 LAB — CBC AND DIFFERENTIAL
Baso # K/uL: 0.1 10*3/uL (ref 0.0–0.2)
Basophil %: 1 % (ref 0–3)
Eos # K/uL: 0.2 10*3/uL (ref 0.0–0.6)
Eosinophil %: 1 % (ref 0–5)
Hematocrit: 38 % (ref 35–47)
Hemoglobin: 11.6 g/dL — ABNORMAL LOW (ref 12.0–16.0)
Lymph # K/uL: 3.1 10*3/uL (ref 1.0–4.8)
Lymphocyte %: 25 % (ref 15–45)
MCH: 26.4 pg (ref 26.0–34.0)
MCHC: 30.8 g/dL — ABNORMAL LOW (ref 31.0–37.5)
MCV: 86 fL (ref 80–100)
Mono # K/uL: 0.6 10*3/uL (ref 0.1–1.0)
Monocyte %: 5 % (ref 0–15)
Neut # K/uL: 8.6 10*3/uL — ABNORMAL HIGH (ref 1.8–8.0)
Platelets: 390 10*3/uL (ref 150–450)
RBC: 4.4 10*6/uL (ref 3.80–5.20)
RDW: 14 % (ref 0.0–15.2)
Seg Neut %: 68 % (ref 45–75)
WBC: 12.6 10*3/uL — ABNORMAL HIGH (ref 4.0–11.0)

## 2016-02-28 LAB — COMPREHENSIVE METABOLIC PANEL
A/G RATIO: 1.7 (ref 1.1–1.8)
ALT: 19 U/L (ref 10–49)
AST: 20 U/L (ref 7–37)
Albumin: 4.4 g/dL (ref 3.2–4.8)
Alk Phos: 128 U/L — ABNORMAL HIGH (ref 46–116)
Anion Gap: 8 mEq/L (ref 4–16)
Bilirubin,Total: 0.2 mg/dL — ABNORMAL LOW (ref 0.3–1.2)
CO2: 25 mEq/L (ref 20–31)
Calcium: 9.2 mg/dL (ref 8.5–10.4)
Chloride: 106 mEq/L (ref 98–108)
Creatinine: 0.9 mg/dL (ref 0.5–0.9)
Globulin: 2.6 g/dL (ref 2.4–4.3)
Glucose: 143 mg/dL — ABNORMAL HIGH (ref 65–100)
Lab: 12 mg/dL (ref 8–20)
Potassium: 5 mEq/L (ref 3.5–5.1)
Sodium: 139 mEq/L (ref 135–145)
Total Protein: 7 g/dL (ref 6.4–8.5)
UN/Creat Ratio: 13.3 (ref 12.0–20.0)

## 2016-02-28 LAB — ESTIMATED GFR
GFR,Black: >60 mL/min
GFR,Caucasian: >60 mL/min

## 2016-02-28 LAB — MAGNESIUM: Magnesium: 2 mg/dL (ref 1.5–2.4)

## 2016-02-28 LAB — BHCG, QUANT PREGNANCY: BHCG, QUANT PREGNANCY: <2 m[IU]/mL

## 2016-02-28 LAB — TROPONIN T: Troponin T: <0.01 ng/mL (ref 0.00–0.09)

## 2016-02-28 LAB — POCT GLUCOSE: Glucose POCT: 137 mg/dL — ABNORMAL HIGH (ref 65–100)

## 2016-02-29 ENCOUNTER — Other Ambulatory Visit: Payer: Self-pay | Admitting: Primary Care

## 2016-02-29 DIAGNOSIS — R197 Diarrhea, unspecified: Secondary | ICD-10-CM

## 2016-02-29 DIAGNOSIS — R109 Unspecified abdominal pain: Secondary | ICD-10-CM

## 2016-02-29 LAB — URINALYSIS REFLEX TO CULTURE
Blood,UA: NEGATIVE
Glucose, Ur: NEGATIVE mg/dL
Ketones, UA: NEGATIVE mg/dL
Leuk Esterase,UA: NEGATIVE
Nitrite,UA: NEGATIVE
PH,Ur: 6.5 (ref 5.0–8.0)
Protein,UA: NEGATIVE mg/dL
Specific Gravity,UA: 1.012 (ref 1.005–1.030)

## 2016-02-29 MED ORDER — TRAMADOL HCL 50 MG PO TABS *I*
50.0000 mg | ORAL_TABLET | Freq: Three times a day (TID) | ORAL | 0 refills | Status: DC | PRN
Start: 2016-02-29 — End: 2016-05-31

## 2016-02-29 NOTE — Telephone Encounter (Signed)
behalf of: [place]   The Drug Utilization Report below displays all of the controlled substance prescriptions, if any, that your patient has filled in the last twelve months. The information displayed on this report is compiled from pharmacy submissions to the Department, and accurately reflects the information as submitted by the pharmacies.  This report was requested by: Julienne Kass   Reference #: [id number]   [place] Prescriptions  Patient Name: Kaylee Harris Birth Date: Sep 10, 1975   Address: [place], [place] [address] Sex: Female   Rx Written Rx Dispensed Drug Quantity Days Supply Prescriber Name Payment Method Dispenser   01/15/2016 01/15/2016 tramadol hcl 50 mg tablet  8 2 [place], [place]. #02   Others' Prescriptions  Patient Name: Kaylee Harris Birth Date: 12-15-1975   Address: [address] [place], [place] [address] Sex: Female   Rx Written Rx Dispensed Drug Quantity Days Supply Prescriber Name Payment Method Dispenser   01/19/2016 01/19/2016 tramadol hcl 50 mg tablet  65 14 [name], Darnell Level (MD) [place]. #02   12/05/2015 12/05/2015 diphenoxylate-atropine 2.5-0.025 mg tablet  56 14 [place], [place]. #02   10/20/2015 10/20/2015 tramadol hcl 50 mg tablet  5 2 [place], [place]. #02     Patient Name: Kaylee Harris Birth Date: July 26, 1975   Address: [address], [place] [address] Sex: Female   Rx Written Rx Dispensed Drug Quantity Days Supply Prescriber Name Payment Method Dispenser   05/26/2015 05/26/2015 tramadol hcl 50 mg tablet  10 3 Leafy Kindle MD Other [facility] [phone]   05/03/2015 05/03/2015 oxycodone-acetaminophen 5-325 mg tablet  10 2 Pellittieri, Lennox Solders MD Other [facility] [phone]   03/14/2015 04/15/2015 zolpidem tart er 6.25 mg tab  30 30 Garry Heater NP Other [facility] [phone]   03/14/2015 03/16/2015 zolpidem tart er  6.25 mg tab  30 30 Corigliano, Suann Larry NP Other [facility] [phone]   * - Drugs marked with an asterisk are compound drugs. If the compound drug is made up of more than one controlled substance, then each controlled substance will be a separate row in the table.      Report Suspicious Activity   Send Questions/Comments   Substance Abuse Treatment Information     Click the Report Suspicious Activity button to report information related to controlled substance suspicious activity to the [employer] of Narcotic Enforcement.   Click the Send Questions/Comments button to send questions about this report to the [facility] of Narcotic Enforcement, or call [phone].   Click the Substance Abuse Treatment Information button to go to the Office of Alcoholism and Substance Abuse Services website, www.[facility].com or call [phone].

## 2016-03-01 ENCOUNTER — Ambulatory Visit: Payer: Self-pay

## 2016-03-01 DIAGNOSIS — F3341 Major depressive disorder, recurrent, in partial remission: Secondary | ICD-10-CM
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
## 2016-03-03 ENCOUNTER — Encounter: Payer: Self-pay | Admitting: Primary Care

## 2016-03-04 ENCOUNTER — Encounter: Payer: Self-pay | Admitting: Psychiatry

## 2016-03-04 ENCOUNTER — Ambulatory Visit: Payer: Self-pay | Admitting: Psychiatry

## 2016-03-04 VITALS — BP 129/70 | HR 80 | Resp 18 | Ht 67.99 in | Wt 283.0 lb

## 2016-03-04 DIAGNOSIS — F3341 Major depressive disorder, recurrent, in partial remission: Secondary | ICD-10-CM

## 2016-03-04 DIAGNOSIS — F4310 Post-traumatic stress disorder, unspecified: Secondary | ICD-10-CM

## 2016-03-04 DIAGNOSIS — F603 Borderline personality disorder: Secondary | ICD-10-CM

## 2016-03-04 DIAGNOSIS — F422 Mixed obsessional thoughts and acts: Secondary | ICD-10-CM

## 2016-03-04 MED ORDER — ARIPIPRAZOLE 5 MG PO TABS *I*
5.0000 mg | ORAL_TABLET | Freq: Every day | ORAL | 1 refills | Status: DC
Start: 2016-03-04 — End: 2016-05-13

## 2016-03-04 NOTE — Progress Notes (Addendum)
Behavioral Health Psychopharmacology Follow-up     Length of Session: 20 minutes.    Diagnosis Addressed    ICD-10-CM ICD-9-CM   1. 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 Mixed obsessional thoughts and acts F42.2 300.3   3. Borderline personality disorder F60.3 301.83   4. PTSD (post-traumatic stress disorder) F43.10 309.81       Recent History and Response to Medications  Patient states: She is feeling better since the buspar was increased to 15 mg bid.  She says she believes the Lamotrigine helps her to "not get upset and not cry at every little thing."  She is not sure if the Abilify is that helpful or the Cymbalta.  She still has the pain from the fibromyalgia and her OCD is no different though she admits to not working on it behaviorally.  Patient says last week she passed out when at the hospital to support her wife and mother in law as her father in law was having surgery.  She was seen in the ED.    HPI     Follow-up    Additional comments: patient is here to follow up on her medications       Last edited by Shirlee More, LPN on 62/83/6629  4:76 PM. (History)          Current use of alcohol or drugs: No        Neurovegetative Symptoms Review:  Energy level: fair  Concentration: fair  Sleep Quality: fair   Number of hours :  (6)  Appetite: fair     Wt Readings from Last 3 Encounters:   03/04/16 128.4 kg (283 lb)   02/16/16 127.9 kg (282 lb)   02/05/16 128.4 kg (283 lb)     Enjoyment/interest: fair    Current Medications  Current Outpatient Prescriptions   Medication Sig    lamoTRIgine (LAMICTAL) 150 MG tablet     traMADol (ULTRAM) 50 MG tablet Take 1 tablet (50 mg total) by mouth every 8 hours as needed (for head pain and abd pain)   Max daily dose: 150 mg    busPIRone (BUSPAR) 30 MG tablet Take 1/2 tablet twice daily (Patient taking differently: Take 30 mg by mouth daily   Take 1/2 tablet twice daily)    mirtazapine (REMERON) 15 MG tablet Take 1 tablet (15  mg total) by mouth nightly    ascorbic acid (VITAMIN C) 100 MG tablet Take 100 mg by mouth daily    midodrine (PROAMATINE) 10 MG tablet Take 1 tablet (10 mg total) by mouth 3 times daily    dicyclomine (BENTYL) 20 MG tablet Take 1 tablet (20 mg total) by mouth 4 times daily (before meals and nightly)    insulin syringe-needle U-100 (BD ULTRAFINE) 31G X 5/16" 0.3 ML HALF-UNIT Use 3 times a day as instructed.    promethazine (PHENERGAN) 12.5 MG tablet Take 1 tablet (12.5 mg total) by mouth 4 times daily as needed    topiramate (TOPAMAX) 100 MG tablet Take 1 tablet (100 mg total) by mouth 2 times daily    SUMAtriptan (IMITREX) 50 MG tablet Take 1 tablet (50 mg total) by mouth as needed for Migraine   Take at onset of headache. May repeat once in 2 hours.    pantoprazole (PROTONIX) 40 MG EC tablet Take 1 tablet (40 mg total) by mouth daily   SWALLOW WHOLE. DO NOT CRUSH, BREAK, OR CHEW.    atorvastatin (LIPITOR) 40 MG  tablet Take 1 tablet (40 mg total) by mouth daily (with dinner)    Insulin Disposable Pump (V-GO 30) KIT Use daily; 30ct/month.    insulin lispro (HUMALOG) 100 UNIT/ML injection vial For VGO: Dinner 62M (6 clicks) ; B & L 4- 6 units. BG 100 - 150 8u (4clk), 151 - 200 10U (5clk), >201 12U ( 6 ck). MDD 63O (36 clk)    TRULICITY 1.77 NH/6.5BX SOPN INJECT 0.75 MG INTO THE SKIN ONCE A WEEK    DULoxetine (CYMBALTA) 60 MG capsule Take 1 capsule (60 mg total) by mouth daily    fluticasone (FLONASE) 50 MCG/ACT nasal spray 1 spray by Nasal route daily    FREESTYLE LITE test strip Use four times daily as directed for 250.02    blood glucose monitor system Brand: cheapest brand available per her insurance.  Use as directed.    lancets Brand Free Style Lite; Use 2 times per day as directed for blood glucose testing.    atenolol (TENORMIN) 25 MG tablet Take 25 mg by mouth daily    Alcohol Swabs (ALCOHOL WIPES) PADS Use BID for BG check    cetirizine (ZYRTEC) 10 MG tablet Take 10 mg by mouth daily     levonorgestrel (MIRENA) 20 MCG/24HR IUD 1 each by Intrauterine route once    Non-System Medication The above patient is followed in our clinic and cannot resume work permanently.    VENTOLIN HFA 108 (90 BASE) MCG/ACT inhaler Inhale 1-2 puffs into the lungs every 4-6 hours as needed for Wheezing    diphenoxylate-atropine (LOMOTIL) 2.5-0.025 MG per tablet Take 1 tablet by mouth 4 times daily as needed for Diarrhea   Max daily dose: 4 tablets    clonazePAM (KLONOPIN) 0.5 MG tablet Take 1 tablet (0.5 mg total) by mouth 2 times daily as needed   Max daily dose: 1 mg    famciclovir (FAMVIR) 500 MG tablet Take 1 tablet (500 mg total) by mouth 3 times daily as needed     No current facility-administered medications for this visit.        Side Effects  Patient Reported Side Effects: None reported    Mental Status  APPEARANCE: Casual  ATTITUDE TOWARD INTERVIEWER: Cooperative  MOTOR ACTIVITY: WNL (within normal limits)  EYE CONTACT: Direct  SPEECH: Normal rate and tone  AFFECT: Full Range and Appropriate  MOOD: Anxious and Depressed  THOUGHT PROCESS: Normal  THOUGHT CONTENT: No unusual themes  PERCEPTION: Within normal limits  ORIENTATION: Alert and Oriented X 3.  CONCENTRATION: Good  MEMORY:   Recent: intact   Remote: intact  COGNITIVE FUNCTION: Average intelligence  JUDGMENT: Intact  IMPULSE CONTROL: Fair  INSIGHT: Fair    Risk Assessment    Self Injury: Patient Denies  Suicidal Ideation: Yes. Describe: has thought she is a drain to her wife and would be better off gone but denies overt suicidal thoughts, planning or intent.  Her wife still has her medications locked.   Homicidal Ideation: Patient Denies  Aggressive Behavior: Patient Denies    Results  none    BP Readings from Last 3 Encounters:   03/04/16 129/70   02/16/16 137/88   02/06/16 153/78       Assessment  FORMULATION: Patient clearly gets some benefit from both the Lamictal and the Buspar.  It is less clear what benefit she gets from the Abilify and Cymbalta.   Mirtazapine sill helps her to sleep. Writer reviewed the previous notes and the last time we reduced the  Abilify, she became more depressed and irritable but at that time she was not taking the Lamictal.  We discussed decreasing the dose down to 5 mg again which she agreed to try.      Recommendations/Plan and Rationale  PLAN: Decrease Abilify to 5 mg daily.  Return in 6 weeks.

## 2016-03-09 ENCOUNTER — Other Ambulatory Visit: Payer: Self-pay | Admitting: Gastroenterology

## 2016-03-09 ENCOUNTER — Other Ambulatory Visit: Payer: Self-pay

## 2016-03-09 LAB — URINALYSIS REFLEX TO CULTURE
Blood,UA: NEGATIVE
Glucose, Ur: NEGATIVE mg/dL
Ketones, UA: NEGATIVE mg/dL
Leuk Esterase,UA: NEGATIVE
Nitrite,UA: NEGATIVE
PH,Ur: 7 (ref 5.0–8.0)
Protein,UA: NEGATIVE mg/dL
Specific Gravity,UA: 1.015 (ref 1.005–1.030)

## 2016-03-09 LAB — ESTIMATED GFR
GFR,Black: >60 mL/min
GFR,Caucasian: >60 mL/min

## 2016-03-09 LAB — BASIC METABOLIC PANEL
Anion Gap: 9 mEq/L (ref 4–16)
CO2: 21 mEq/L (ref 20–31)
Calcium: 9.3 mg/dL (ref 8.5–10.4)
Chloride: 108 mEq/L (ref 98–108)
Creatinine: 0.7 mg/dL (ref 0.5–0.9)
Glucose: 154 mg/dL — ABNORMAL HIGH (ref 65–100)
Lab: 10 mg/dL (ref 8–20)
Potassium: 4.3 mEq/L (ref 3.5–5.1)
Sodium: 138 mEq/L (ref 135–145)
UN/Creat Ratio: 14.3 (ref 12.0–20.0)

## 2016-03-09 LAB — BHCG, QUANT PREGNANCY: BHCG, QUANT PREGNANCY: <2 m[IU]/mL

## 2016-03-09 LAB — URINE MICROSCOPIC (IQ200)

## 2016-03-09 LAB — CBC AND DIFFERENTIAL
Baso # K/uL: 0.1 10*3/uL (ref 0.0–0.2)
Basophil %: 0 % (ref 0–3)
Eos # K/uL: 0.1 10*3/uL (ref 0.0–0.6)
Eosinophil %: 1 % (ref 0–5)
Hematocrit: 37 % (ref 35–47)
Hemoglobin: 11.7 g/dL — ABNORMAL LOW (ref 12.0–16.0)
Lymph # K/uL: 3.2 10*3/uL (ref 1.0–4.8)
Lymphocyte %: 19 % (ref 15–45)
MCH: 26.4 pg (ref 26.0–34.0)
MCHC: 31.4 g/dL (ref 31.0–37.5)
MCV: 84 fL (ref 80–100)
Mono # K/uL: 0.8 10*3/uL (ref 0.1–1.0)
Monocyte %: 5 % (ref 0–15)
Neut # K/uL: 12.5 10*3/uL — ABNORMAL HIGH (ref 1.8–8.0)
Platelets: 431 10*3/uL (ref 150–450)
RBC: 4.43 10*6/uL (ref 3.80–5.20)
RDW: 13.9 % (ref 0.0–15.2)
Seg Neut %: 74 % (ref 45–75)
WBC: 16.9 10*3/uL — ABNORMAL HIGH (ref 4.0–11.0)

## 2016-03-10 ENCOUNTER — Other Ambulatory Visit: Payer: Self-pay | Admitting: Gastroenterology

## 2016-03-10 LAB — CBC AND DIFFERENTIAL
Baso # K/uL: 0 10*3/uL (ref 0.0–0.2)
Basophil %: 0 % (ref 0–3)
Eos # K/uL: 0.2 10*3/uL (ref 0.0–0.6)
Eosinophil %: 1 % (ref 0–5)
Hematocrit: 35 % (ref 35–47)
Hemoglobin: 10.8 g/dL — ABNORMAL LOW (ref 12.0–16.0)
Lymph # K/uL: 3.7 10*3/uL (ref 1.0–4.8)
Lymphocyte %: 24 % (ref 15–45)
MCH: 26.7 pg (ref 26.0–34.0)
MCHC: 31.3 g/dL (ref 31.0–37.5)
MCV: 85 fL (ref 80–100)
Mono # K/uL: 0.6 10*3/uL (ref 0.1–1.0)
Monocyte %: 4 % (ref 0–15)
Neut # K/uL: 10.9 10*3/uL — ABNORMAL HIGH (ref 1.8–8.0)
Platelets: 335 10*3/uL (ref 150–450)
RBC: 4.05 10*6/uL (ref 3.80–5.20)
RDW: 14 % (ref 0.0–15.2)
Seg Neut %: 71 % (ref 45–75)
WBC: 15.3 10*3/uL — ABNORMAL HIGH (ref 4.0–11.0)

## 2016-03-10 LAB — TROPONIN T
Troponin T: <0.01 ng/mL (ref 0.00–0.09)
Troponin T: <0.01 ng/mL (ref 0.00–0.09)

## 2016-03-10 LAB — POCT GLUCOSE
Glucose POCT: 128 mg/dL — ABNORMAL HIGH (ref 65–100)
Glucose POCT: 136 mg/dL — ABNORMAL HIGH (ref 65–100)
Glucose POCT: 179 mg/dL — ABNORMAL HIGH (ref 65–100)
Glucose POCT: 148 mg/dL — ABNORMAL HIGH (ref 65–100)
Glucose POCT: 124 mg/dL — ABNORMAL HIGH (ref 65–100)

## 2016-03-10 LAB — DIFF MANUAL

## 2016-03-11 ENCOUNTER — Telehealth: Payer: Self-pay

## 2016-03-11 LAB — CBC AND DIFFERENTIAL
Baso # K/uL: 0.1 10*3/uL (ref 0.0–0.2)
Basophil %: 1 % (ref 0–3)
Eos # K/uL: 0.2 10*3/uL (ref 0.0–0.6)
Eosinophil %: 2 % (ref 0–5)
Hematocrit: 36 % (ref 35–47)
Hemoglobin: 11.3 g/dL — ABNORMAL LOW (ref 12.0–16.0)
Lymph # K/uL: 3.6 10*3/uL (ref 1.0–4.8)
Lymphocyte %: 30 % (ref 15–45)
MCH: 26.8 pg (ref 26.0–34.0)
MCHC: 31.1 g/dL (ref 31.0–37.5)
MCV: 86 fL (ref 80–100)
Mono # K/uL: 0.6 10*3/uL (ref 0.1–1.0)
Monocyte %: 5 % (ref 0–15)
Neut # K/uL: 7.6 10*3/uL (ref 1.8–8.0)
Platelets: 355 10*3/uL (ref 150–450)
RBC: 4.21 10*6/uL (ref 3.80–5.20)
RDW: 13.9 % (ref 0.0–15.2)
Seg Neut %: 62 % (ref 45–75)
WBC: 12.3 10*3/uL — ABNORMAL HIGH (ref 4.0–11.0)

## 2016-03-11 LAB — BASIC METABOLIC PANEL
Anion Gap: 8 mEq/L (ref 4–16)
CO2: 25 mEq/L (ref 20–31)
Calcium: 8.8 mg/dL (ref 8.5–10.4)
Chloride: 108 mEq/L (ref 98–108)
Creatinine: 0.9 mg/dL (ref 0.5–0.9)
Glucose: 127 mg/dL — ABNORMAL HIGH (ref 65–100)
Lab: 8 mg/dL (ref 8–20)
Potassium: 3.9 mEq/L (ref 3.5–5.1)
Sodium: 141 mEq/L (ref 135–145)
UN/Creat Ratio: 8.9 — ABNORMAL LOW (ref 12.0–20.0)

## 2016-03-11 LAB — POCT GLUCOSE
Glucose POCT: 116 mg/dL — ABNORMAL HIGH (ref 65–100)
Glucose POCT: 149 mg/dL — ABNORMAL HIGH (ref 65–100)
Glucose POCT: 192 mg/dL — ABNORMAL HIGH (ref 65–100)
Glucose POCT: 166 mg/dL — ABNORMAL HIGH (ref 65–100)

## 2016-03-11 LAB — ESTIMATED GFR
GFR,Black: >60 mL/min
GFR,Caucasian: >60 mL/min

## 2016-03-11 LAB — AEROBIC CULTURE

## 2016-03-11 NOTE — Telephone Encounter (Signed)
ED f/u call.  Left voice mail for pt to call office to schedule any needed f/u appt

## 2016-03-11 NOTE — Telephone Encounter (Signed)
Pt's wife calling to state pt is still at [facility] since fall on 03/09/16.  Wife states pt with amnesia and does not know her own name.    Barbera Setters will communicate with this office when pt is discharged.

## 2016-03-12 LAB — CBC AND DIFFERENTIAL
Baso # K/uL: 0.1 10*3/uL (ref 0.0–0.2)
Basophil %: 0 % (ref 0–3)
Eos # K/uL: 0 10*3/uL (ref 0.0–0.6)
Eosinophil %: 0 % (ref 0–5)
Hematocrit: 38 % (ref 35–47)
Hemoglobin: 11.8 g/dL — ABNORMAL LOW (ref 12.0–16.0)
Lymph # K/uL: 2.6 10*3/uL (ref 1.0–4.8)
Lymphocyte %: 12 % — ABNORMAL LOW (ref 15–45)
MCH: 26.5 pg (ref 26.0–34.0)
MCHC: 31.3 g/dL (ref 31.0–37.5)
MCV: 85 fL (ref 80–100)
Mono # K/uL: 0.7 10*3/uL (ref 0.1–1.0)
Monocyte %: 3 % (ref 0–15)
Neut # K/uL: 17.5 10*3/uL — ABNORMAL HIGH (ref 1.8–8.0)
Platelets: 444 10*3/uL (ref 150–450)
RBC: 4.46 10*6/uL (ref 3.80–5.20)
RDW: 13.6 % (ref 0.0–15.2)
Seg Neut %: 83 % — ABNORMAL HIGH (ref 45–75)
WBC: 21.1 10*3/uL — ABNORMAL HIGH (ref 4.0–11.0)

## 2016-03-12 LAB — BASIC METABOLIC PANEL
Anion Gap: 11 mEq/L (ref 4–16)
CO2: 21 mEq/L (ref 20–31)
Calcium: 9.6 mg/dL (ref 8.5–10.4)
Chloride: 105 mEq/L (ref 98–108)
Creatinine: 0.8 mg/dL (ref 0.5–0.9)
Glucose: 142 mg/dL — ABNORMAL HIGH (ref 65–100)
Lab: 10 mg/dL (ref 8–20)
Potassium: 4.4 mEq/L (ref 3.5–5.1)
Sodium: 137 mEq/L (ref 135–145)
UN/Creat Ratio: 12.5 (ref 12.0–20.0)

## 2016-03-12 LAB — ESTIMATED GFR
GFR,Black: >60 mL/min
GFR,Caucasian: >60 mL/min

## 2016-03-12 LAB — POCT GLUCOSE
Glucose POCT: 134 mg/dL — ABNORMAL HIGH (ref 65–100)
Glucose POCT: 121 mg/dL — ABNORMAL HIGH (ref 65–100)
Glucose POCT: 199 mg/dL — ABNORMAL HIGH (ref 65–100)
Glucose POCT: 153 mg/dL — ABNORMAL HIGH (ref 65–100)

## 2016-03-13 ENCOUNTER — Telehealth: Payer: Self-pay | Admitting: Registered Nurse

## 2016-03-13 ENCOUNTER — Ambulatory Visit: Payer: Medicare (Managed Care) | Admitting: Registered Nurse

## 2016-03-13 LAB — CBC AND DIFFERENTIAL
Baso # K/uL: 0.1 10*3/uL (ref 0.0–0.2)
Basophil %: 1 % (ref 0–3)
Eos # K/uL: 0.2 10*3/uL (ref 0.0–0.6)
Eosinophil %: 1 % (ref 0–5)
Hematocrit: 42 % (ref 35–47)
Hemoglobin: 12.7 g/dL (ref 12.0–16.0)
Lymph # K/uL: 5.4 10*3/uL — ABNORMAL HIGH (ref 1.0–4.8)
Lymphocyte %: 36 % (ref 15–45)
MCH: 25.9 pg — ABNORMAL LOW (ref 26.0–34.0)
MCHC: 30.1 g/dL — ABNORMAL LOW (ref 31.0–37.5)
MCV: 86 fL (ref 80–100)
Mono # K/uL: 0.9 10*3/uL (ref 0.1–1.0)
Monocyte %: 6 % (ref 0–15)
Neut # K/uL: 8.5 10*3/uL — ABNORMAL HIGH (ref 1.8–8.0)
Platelets: 380 10*3/uL (ref 150–450)
RBC: 4.9 10*6/uL (ref 3.80–5.20)
RDW: 14 % (ref 0.0–15.2)
Seg Neut %: 56 % (ref 45–75)
WBC: 15.3 10*3/uL — ABNORMAL HIGH (ref 4.0–11.0)

## 2016-03-13 LAB — POCT GLUCOSE
Glucose POCT: 127 mg/dL — ABNORMAL HIGH (ref 65–100)
Glucose POCT: 156 mg/dL — ABNORMAL HIGH (ref 65–100)
Glucose POCT: 217 mg/dL — ABNORMAL HIGH (ref 65–100)

## 2016-03-13 LAB — BASIC METABOLIC PANEL
Anion Gap: 11 mEq/L (ref 4–16)
CO2: 25 mEq/L (ref 20–31)
Calcium: 9.5 mg/dL (ref 8.5–10.4)
Chloride: 103 mEq/L (ref 98–108)
Creatinine: 0.9 mg/dL (ref 0.5–0.9)
Glucose: 132 mg/dL — ABNORMAL HIGH (ref 65–100)
Lab: 12 mg/dL (ref 8–20)
Potassium: 3.7 mEq/L (ref 3.5–5.1)
Sodium: 139 mEq/L (ref 135–145)
UN/Creat Ratio: 13.3 (ref 12.0–20.0)

## 2016-03-13 LAB — ESTIMATED GFR
GFR,Black: >60 mL/min
GFR,Caucasian: >60 mL/min

## 2016-03-13 NOTE — Telephone Encounter (Signed)
LM re: WEd fu appt & to clarify if Kaelen still inpt at [facility].  Will also send letter.

## 2016-03-14 ENCOUNTER — Telehealth: Payer: Self-pay

## 2016-03-14 LAB — CBC AND DIFFERENTIAL
Baso # K/uL: 0.1 10*3/uL (ref 0.0–0.2)
Basophil %: 1 % (ref 0–3)
Eos # K/uL: 0.2 10*3/uL (ref 0.0–0.6)
Eosinophil %: 1 % (ref 0–5)
Hematocrit: 40 % (ref 35–47)
Hemoglobin: 12.2 g/dL (ref 12.0–16.0)
Lymph # K/uL: 4.6 10*3/uL (ref 1.0–4.8)
Lymphocyte %: 29 % (ref 15–45)
MCH: 26.3 pg (ref 26.0–34.0)
MCHC: 30.7 g/dL — ABNORMAL LOW (ref 31.0–37.5)
MCV: 86 fL (ref 80–100)
Mono # K/uL: 0.8 10*3/uL (ref 0.1–1.0)
Monocyte %: 5 % (ref 0–15)
Neut # K/uL: 9.8 10*3/uL — ABNORMAL HIGH (ref 1.8–8.0)
Platelets: 383 10*3/uL (ref 150–450)
RBC: 4.63 10*6/uL (ref 3.80–5.20)
RDW: 14.1 % (ref 0.0–15.2)
Seg Neut %: 63 % (ref 45–75)
WBC: 15.7 10*3/uL — ABNORMAL HIGH (ref 4.0–11.0)

## 2016-03-14 LAB — BASIC METABOLIC PANEL
Anion Gap: 12 mEq/L (ref 4–16)
CO2: 28 mEq/L (ref 20–31)
Calcium: 9.8 mg/dL (ref 8.5–10.4)
Chloride: 102 mEq/L (ref 98–108)
Creatinine: 0.9 mg/dL (ref 0.5–0.9)
Glucose: 198 mg/dL — ABNORMAL HIGH (ref 65–100)
Lab: 14 mg/dL (ref 8–20)
Potassium: 3.7 mEq/L (ref 3.5–5.1)
Sodium: 142 mEq/L (ref 135–145)
UN/Creat Ratio: 15.6 (ref 12.0–20.0)

## 2016-03-14 LAB — POCT GLUCOSE
Glucose POCT: 136 mg/dL — ABNORMAL HIGH (ref 65–100)
Glucose POCT: 129 mg/dL — ABNORMAL HIGH (ref 65–100)
Glucose POCT: 141 mg/dL — ABNORMAL HIGH (ref 65–100)

## 2016-03-14 LAB — ESTIMATED GFR
GFR,Black: >60 mL/min
GFR,Caucasian: >60 mL/min

## 2016-03-14 NOTE — Telephone Encounter (Signed)
Attempted to call back, unable to leave VM    [name], MD  [facility]  03/14/2016  2:49 PM

## 2016-03-14 NOTE — Telephone Encounter (Signed)
A secretary called from [facility] to schedule a hospital follow up for this patient. Temari is scheduled for Monday 03/18/16 with Nadine. The attending is requesting a call back from Dr. Laurance Flatten. Please contact Dr. Reeves Dam at [phone].

## 2016-03-15 ENCOUNTER — Encounter: Payer: Self-pay | Admitting: Gastroenterology

## 2016-03-15 ENCOUNTER — Telehealth: Payer: Self-pay

## 2016-03-15 NOTE — Telephone Encounter (Signed)
Home nurse made aware of the below information.  Nurse expresses appreciation for the return call.

## 2016-03-15 NOTE — Telephone Encounter (Signed)
I am aware of these interactions. topamax and IUD interactions are not significant for this pt. interactinos between cymbalta, sumatriptan and tramadol are ok since sumatriptan and tramadol are prns.    The falls are a chronic issue, has had extensive w/u with Neuro and cards in past.    [name], MD  [facility]  03/15/2016  3:11 PM

## 2016-03-15 NOTE — Telephone Encounter (Signed)
Major side effects of meds, per nurse.      Home nurse inquiring if f/u with neuro recommended d/t pts frequent falls?    Per home nurse, noted interaction/negative side effects with taking the combination of cymbalta, sumatriptan and tramadol, Also between topamax and IUD.   Please advise.    Case manager is Towana Badger, [phone], if any further questions.

## 2016-03-15 NOTE — Telephone Encounter (Signed)
Santiago Glad, RN from [facility] called, [phone] to advise that she has been opened to home care for syncope.  Patient sees psych but is tearful and seems depressed.  Would like to speak with a nurse regarding neuro. She is available until 5pm today.

## 2016-03-18 ENCOUNTER — Ambulatory Visit: Payer: Self-pay | Admitting: Pulmonary and Critical Care Medicine

## 2016-03-18 ENCOUNTER — Telehealth: Payer: Self-pay

## 2016-03-18 ENCOUNTER — Ambulatory Visit: Payer: Self-pay

## 2016-03-18 NOTE — Telephone Encounter (Signed)
Clinical Management Note   Writer received a message from patient's care manager that she wanted to talk with Probation officer. Writer does not have a release but called patient and the care manager was there. Writer spoke with the care manager about scheduling patient for an earlier appointment. Patient told Probation officer that she had another fainting event and was in the hospital for "awhile". She said that the doctor there told her it was psychogenic and this made her feel a burden, worthless, etc. With a return of SI. Writer validated patient's thoughts and feelings. Writer reviewed skills patient can use and scheduled patient for an earlier appointment on 03/27/16 @10 .

## 2016-03-18 NOTE — Telephone Encounter (Signed)
Will leave for PCP to address once she returns

## 2016-03-18 NOTE — Telephone Encounter (Signed)
Rachelle nurse calling from [facility] stating that she had a first time appointment with patient today. Ernst Bowler states that the patient reports feeling hopeless, depressed, and like she is a burden to her signficiant other. Rachelle reports that the patient doesn't have a plan to commit suicide. Ernst Bowler also stated that she provided patient with the suicide hotline. Ernst Bowler states that the patient had an appointment with her mental health counselor from [facility], but had canceled it due to appointment conflict. Rescheduled it for next week. Rachella stated that she was concerned about the patient because these are the same circumstances last time that made her want to hurt herself. Please call Rachelle at [phone] with any questions or concerns or patient at [phone] to discuss depression issues.

## 2016-03-18 NOTE — Telephone Encounter (Signed)
Routed to provider as FYI

## 2016-03-18 NOTE — Telephone Encounter (Signed)
Pt has visiting nurse coming to home today pt cancel today at 1pm with provider. Pt r/s for 11/29 at 230

## 2016-03-19 ENCOUNTER — Encounter: Payer: Self-pay | Admitting: Gastroenterology

## 2016-03-19 NOTE — Telephone Encounter (Signed)
Noted, routed to therapist and psychiatrist.    [name], MD  [facility]  03/19/2016  9:04 AM

## 2016-03-20 ENCOUNTER — Encounter: Payer: Self-pay | Admitting: Registered Nurse

## 2016-03-21 ENCOUNTER — Telehealth: Payer: Self-pay

## 2016-03-21 NOTE — Telephone Encounter (Signed)
Pt in office and medications given to her.

## 2016-03-21 NOTE — Telephone Encounter (Signed)
Noted  [name], MD  [facility]  03/21/2016  5:08 PM

## 2016-03-21 NOTE — Telephone Encounter (Signed)
Routed to provider as FYI

## 2016-03-21 NOTE — Telephone Encounter (Signed)
Pt advised that medication has been delivered to this office for her and is ready for pick up.

## 2016-03-21 NOTE — Telephone Encounter (Signed)
Richelle a nurse form [facility] called to let Dr. Laurance Flatten know that pt last week had feeling and negative thoughts about her self so pt used a suicide text line that [place] had given pt that night and reported to her that pt had a positive experience and feels much better today. Richelle stated that pt has not been taking her naproxen daily for her side pain but agreed to start taking this medication to help her. If you have any questions please call [phone] as this was verified as the best number to reach her.

## 2016-03-27 ENCOUNTER — Ambulatory Visit: Payer: Self-pay

## 2016-03-27 DIAGNOSIS — F3341 Major depressive disorder, recurrent, in partial remission: Secondary | ICD-10-CM

## 2016-03-27 NOTE — Progress Notes (Signed)
Behavioral Health Progress Note     LENGTH OF SESSION: 45 minutes    Contact Type:  Location: On Site    Face to Face     Problem(s)/Goals Addressed from Treatment Plan:    Problem 1:   Treatment Problem #1 01/10/2016   Patient Identified Problem Depression and anxiety       Goal for this problem:    Treatment Goal #1 01/10/2016   Patient Identified Goal Learn DBT and PST skills to manage affect       Progress towards this goal: Patient reports on her recent hospitalization.      Mental Status Exam:  APPEARANCE: Appears stated age, Well-groomed, Casual  ATTITUDE TOWARD INTERVIEWER: Cooperative  MOTOR ACTIVITY: WNL (within normal limits)  EYE CONTACT: Direct and Avoidant  SPEECH: Normal rate and tone and hesitant  AFFECT: Full Range  MOOD: Depressed  THOUGHT PROCESS: Circumstantial  THOUGHT CONTENT: Negative Rumination  PERCEPTION: No evidence of hallucinations  CURRENT SUICIDAL IDEATION: Vague/passive suicidal ideation  CURRENT HOMICIDAL IDEATION: Patient denies  ORIENTATION: Alert and Oriented X 3.  CONCENTRATION: Good  MEMORY:   Recent: impaired recent memory   Remote: intact  COGNITIVE FUNCTION: Average intelligence  JUDGMENT: Intact  IMPULSE CONTROL: Fair  INSIGHT: Fair    Risk Assessment:  ASSESSMENT OF RISK FOR SUICIDAL BEHAVIOR  Changes in risk for suicide from baseline Formulation of Risk and/or previous intake, including newly identified risk, if any: none    Violence risk was assessed and No Change noted from baseline formulation of risk and/or previous assessment.    Session Content::  Patient reports on her recent hospitalization. She said that after passing out and hitting her head on the bathtub, she had no memory for approximately 5 days. Patient told Probation officer that she has been told that she did not recognize people who came to visit and was fearful during this episode. She said that the examining doctor at [facility] told her family and wife that she did not have POTS or delirium but it was psychogenic and this  was very upsetting. Patient told writer that while she was in the hospital one of her and her wife's friends stayed over as they were drinking together. She said that she confronted her wife about this after going through her phone. Patient told Probation officer that she now has to have physical therapy in the home, a visiting nurse to assist with household chores, and can't be left on her own. She said that she is very frustrated with herself and this situation. Writer validated patient's thoughts and feelings. Writer and patient discussed alternative perspectives to what patient was voicing about people's motives, imagined thoughts, etc. Patient said that she will try to voice her thoughts directly with the parties involved. Writer and patient discussed patient's SI when in emotion mind. Writer asked patient to ask herself three questions when she finds herself caught in negative thinking: Is this kind; is this necessary; is this true (facts). Patient said that she would work on this. Writer talked to patient again about the benefits of couples' counseling and patient said that she would talk with her spouse about this.    Visit Diagnosis:    ICD-10-CM ICD-9-CM   1. 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    NEXT APPT: 04/11/16 @3 :[address] LCSW

## 2016-03-30 ENCOUNTER — Other Ambulatory Visit: Payer: Self-pay | Admitting: Primary Care

## 2016-04-01 ENCOUNTER — Other Ambulatory Visit: Payer: Self-pay | Admitting: Psychiatry

## 2016-04-01 MED ORDER — LAMOTRIGINE 150 MG PO TABS *I'
150.0000 mg | ORAL_TABLET | Freq: Every day | ORAL | 0 refills | Status: DC
Start: 2016-04-01 — End: 2016-04-28

## 2016-04-03 ENCOUNTER — Encounter: Payer: Self-pay | Admitting: Gastroenterology

## 2016-04-03 ENCOUNTER — Ambulatory Visit: Payer: Self-pay

## 2016-04-05 ENCOUNTER — Encounter: Payer: Self-pay | Admitting: Gastroenterology

## 2016-04-11 ENCOUNTER — Ambulatory Visit: Payer: Self-pay

## 2016-04-11 ENCOUNTER — Telehealth: Payer: Self-pay

## 2016-04-11 ENCOUNTER — Other Ambulatory Visit: Payer: Self-pay | Admitting: Psychiatry

## 2016-04-11 DIAGNOSIS — F339 Major depressive disorder, recurrent, unspecified: Secondary | ICD-10-CM

## 2016-04-11 DIAGNOSIS — F603 Borderline personality disorder: Secondary | ICD-10-CM

## 2016-04-11 NOTE — Progress Notes (Signed)
Behavioral Health Progress Note     LENGTH OF SESSION: 45 minutes    Contact Type:  Location: On Site    Face to Face     Problem(s)/Goals Addressed from Treatment Plan:    Problem 1:   Treatment Problem #1 01/10/2016   Patient Identified Problem Depression and anxiety       Goal for this problem:    Treatment Goal #1 01/10/2016   Patient Identified Goal Learn DBT and PST skills to manage affect       Progress towards this goal: Problem resolving. Comment: Patient reports no SI.    Mental Status Exam:  APPEARANCE: Appears stated age, Well-groomed, Casual  ATTITUDE TOWARD INTERVIEWER: Cooperative  MOTOR ACTIVITY: WNL (within normal limits)  EYE CONTACT: Direct and Indirect  SPEECH: Normal rate and tone and Pressured  AFFECT: Full Range  MOOD: Neutral and Sad  THOUGHT PROCESS: Circumstantial  THOUGHT CONTENT: No unusual themes and Negative Rumination  PERCEPTION: No evidence of hallucinations  CURRENT SUICIDAL IDEATION: patient denies  CURRENT HOMICIDAL IDEATION: Patient denies  ORIENTATION: Alert and Oriented X 3.  CONCENTRATION: Good  MEMORY:   Recent: intact   Remote: intact  COGNITIVE FUNCTION: Average intelligence  JUDGMENT: Intact  IMPULSE CONTROL: Fair  INSIGHT: Fair and Limited    Risk Assessment:  ASSESSMENT OF RISK FOR SUICIDAL BEHAVIOR  Changes in risk for suicide from baseline Formulation of Risk and/or previous intake, including newly identified risk, if any: none    Violence risk was assessed and No Change noted from baseline formulation of risk and/or previous assessment.     Session Content:: Patient reports no SI. Patient gave Probation officer a Marine scientist for a bathroom upgrade. Writer reviewed this and advised patient that the clinic did not serve in the role of managing money which is what is required. Writer and patient discussed patient talking to her social worker to complete. Patient told Probation officer about her birthday dinner and events leading up to this. She said that her sister told her that she would  not be able to come because she did not have money. Patient talked about her sister "always" doing this and then wound up going, was in a bad mood, everyone took care of her sister even though it was her night, etc. Writer validated patient's thoughts and feelings. Writer and patient discussed patient's sister's behavior and patient's response. Patient talked about her OCD behaviors and her wife's OCD behaviors. She said that she will work on managing her reaction. Writer talked to patient about engaging in anxiety management group as patient will now have transportation. Patient said that she would try.     Visit Diagnosis:    ICD-10-CM ICD-9-CM   1. MDD (major depressive disorder), recurrent episode F33.9 296.30   2. Borderline personality disorder F60.3 301.83       Interventions:  Group services referral:  Anxiety Management  Identified adaptive/maladaptive family patterns  Provided Psychoeducation  Supportive Psychotherapy  Treatment Planning  Solution Focused therapy    Current Treatment Plan   Created/Updated On 01/10/2016   Next Treatment Plan Due 04/09/2016     Plan:  Psychotherapy continues as described in care plan; plan remains the same.    NEXT APPT: 05/01/16 @2       Tresa Res, LCSW

## 2016-04-11 NOTE — Telephone Encounter (Signed)
Called and patient notified that shipment of humalog has arrived. In medication room fridgerator. Pt will come in to pick up at her convenience.

## 2016-04-12 NOTE — Progress Notes (Signed)
[facility]  ADULT AMBULATORY GROUP THERAPY SERVICE REFERRAL     Identifying Data:  Patient: Kaylee Harris Record Number: [id number]  DOB: 1975-06-05  Address: [place] [address]     Patient Phone: [phone] (home) [phone] (work)    Insurance: Payor: [place] / Plan: Leisure centre manager CHOICE / Product Type: *No Product type* /        Check where patient is currently open in the system:     [x]   [facility]     []   [place]     []   [place]    []   [facility]     []   [facility]    []   [facility]     []   [facility]     Diagnosis:     ICD-10-CM ICD-9-CM   1. MDD (major depressive disorder), recurrent episode F33.9 296.30   2. Borderline personality disorder F60.3 301.83       History:  Was patient HOSPITALIZED within the last three months? Yes  [facility]  Past PSYCHIATRIC HISTORY: Inpatient, Partial, Outpatient  Previous GROUP EXPERIENCE: Yes       Risk Factors:  Suicide or self-mutilation: Past  Violence: None  Alcohol Use: minimal  Drug Use: None    Goals for Group Treatment:  Goal #1: Learn skills to increase tolerance  Goal #2:     Adult Group Therapy Offerings (Please check preferential group and time):    [facility]  []  Depression Management (Mon. 4:00-5:30)  [x]  Anxiety Management:   [x]  (Wed. 5:30-6:30) [x]  (Thurs. 4:00-5:00)  []  Present Centered Group Therapy of Survivors of Trauma (Thurs. 3:00-4:00)   []  Focused Brief Group (Tues. 5:00-6:00)  []  Distress Tolerance Skills Training:   []  Mon. 5:30-7:00 []  Tues. 4:00-5:30 []  Tues. 6:30-8:00 []  Wed. 1:00-2:30 []  Wed.5:30-7:00  []  Thurs.11:30-1:00 []  Thurs.4:30-6:00     []  Interpersonal Skills Group:   []  Mon. 5:30-7:00 []  Tues. 4:00-5:30 []  Tues. 6:30-8:00  []  Wed. 1:00-2:30   []  Wed.5:30-7:00 []  Thurs. 11:30-1:00 []  Thurs.4:30-6:00    []  Emotional Regulation Skills Training:   []  Mon. 5:30-7:00 []  Tues.  4:00-5:30 []  Tues. 6:30-8:00  []  Wed. 1:00-2:30    []  Wed.5:30-7:00 []  Thurs. 11:30-1:00 []  Thurs.4:30-6:00  []  Other      [facility]  []  Anxiety Management   []  Listening Skills  []  DBT (Emotion Regulation, Interpersonal Skills, Distress Tolerance)  []  CBT for Depression/Anxiety Management (SPANISH ONLY)  []  Symptom Management for Psychosis  []  Complicated Grief  []  Relaxation  []  Mental Illness/Chemical Addiction  []  Survivors of Trauma  []  Transition Group      Comments:  Is the patient receptive to this referral? Yes  Will you be involved in the patient's continued care? Yes  Does patient have an individual therapist? Yes    Name of Therapist: Tresa Res    Is he/she in support of this referral? Yes      Referral Processing Information:  The above information must be completed in full for referral to be processed.    Please send completed form to Herbie Drape, Psychiatry via [facility] for [facility].  Please send completed form to Nicolette Bang, Psychiatry via [facility] for [facility].

## 2016-04-15 ENCOUNTER — Ambulatory Visit: Payer: Self-pay | Admitting: Psychiatry

## 2016-04-15 ENCOUNTER — Encounter: Payer: Self-pay | Admitting: Psychiatry

## 2016-04-15 VITALS — BP 133/67 | HR 82 | Resp 18 | Ht 67.99 in | Wt 289.0 lb

## 2016-04-15 DIAGNOSIS — F603 Borderline personality disorder: Secondary | ICD-10-CM

## 2016-04-15 DIAGNOSIS — F330 Major depressive disorder, recurrent, mild: Secondary | ICD-10-CM

## 2016-04-15 DIAGNOSIS — F422 Mixed obsessional thoughts and acts: Secondary | ICD-10-CM

## 2016-04-15 MED ORDER — BUSPIRONE HCL 30 MG PO TABS *I*
30.0000 mg | ORAL_TABLET | Freq: Two times a day (BID) | ORAL | 2 refills | Status: DC
Start: 2016-04-15 — End: 2016-06-18

## 2016-04-15 MED ORDER — MIRTAZAPINE 15 MG PO TABS *I*
15.0000 mg | ORAL_TABLET | Freq: Every evening | ORAL | 2 refills | Status: DC
Start: 2016-04-15 — End: 2016-07-17

## 2016-04-15 NOTE — Progress Notes (Signed)
Behavioral Health Psychopharmacology Follow-up     Length of Session: 20 minutes.    Diagnosis Addressed    ICD-10-CM ICD-9-CM   1. Borderline personality disorder F60.3 301.83   2. Mixed obsessional thoughts and acts F42.2 300.3   3. 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  She discussed being hospitalized shortly after her last visit for a severe fall where she hit her head.  She says the symptoms of depression and anxiety are mild.  "Even my aunt says I seem happier."  She says she is not sure why this is because "it seems like everything is the same."      HPI     Follow-up    Additional comments: patient is here to follow up on her medications       Last edited by Shirlee More, LPN on 29/52/8413  2:44 PM. (History)          Current use of alcohol or drugs: No        Neurovegetative Symptoms Review:  Energy level: fair  Concentration: fair  Sleep Quality: fair   Number of hours :  (6)  Appetite: good     Wt Readings from Last 3 Encounters:   04/15/16 131.1 kg (289 lb)   03/04/16 128.4 kg (283 lb)   02/16/16 127.9 kg (282 lb)     Enjoyment/interest: fair    Current Medications  Current Outpatient Prescriptions   Medication Sig    dicyclomine (BENTYL) 20 MG tablet TAKE 1 TABLET (20 MG TOTAL) BY MOUTH 4 TIMES DAILY (BEFORE MEALS AND NIGHTLY)    lamoTRIgine (LAMICTAL) 150 MG tablet Take 1 tablet (150 mg total) by mouth daily    ARIPiprazole (ABILIFY) 5 MG tablet Take 1 tablet (5 mg total) by mouth daily    traMADol (ULTRAM) 50 MG tablet Take 1 tablet (50 mg total) by mouth every 8 hours as needed (for head pain and abd pain)   Max daily dose: 150 mg    busPIRone (BUSPAR) 30 MG tablet Take 1/2 tablet twice daily (Patient taking differently: Take 30 mg by mouth daily   Take 1/2 tablet twice daily)    mirtazapine (REMERON) 15 MG tablet Take 1 tablet (15  mg total) by mouth nightly    ascorbic acid (VITAMIN C) 100 MG tablet Take 100 mg by mouth daily    midodrine (PROAMATINE) 10 MG tablet Take 1 tablet (10 mg total) by mouth 3 times daily    insulin syringe-needle U-100 (BD ULTRAFINE) 31G X 5/16" 0.3 ML HALF-UNIT Use 3 times a day as instructed.    diphenoxylate-atropine (LOMOTIL) 2.5-0.025 MG per tablet Take 1 tablet by mouth 4 times daily as needed for Diarrhea   Max daily dose: 4 tablets    topiramate (TOPAMAX) 100 MG tablet Take 1 tablet (100 mg total) by mouth 2 times daily    pantoprazole (PROTONIX) 40 MG EC tablet Take 1 tablet (40 mg total) by mouth daily   SWALLOW WHOLE. DO NOT CRUSH, BREAK, OR CHEW.    atorvastatin (LIPITOR) 40 MG tablet Take 1 tablet (40 mg total) by mouth daily (with dinner)    Insulin Disposable Pump (V-GO 30) KIT Use daily; 30ct/month.    insulin lispro (HUMALOG) 100 UNIT/ML injection vial For VGO: Dinner 01U (6 clicks) ; B & L 4-  6 units. BG 100 - 150 8u (4clk), 151 - 200 10U (5clk), >201 12U ( 6 ck). MDD 22G (36 clk)    TRULICITY 2.54 YH/0.6CB SOPN INJECT 0.75 MG INTO THE SKIN ONCE A WEEK    DULoxetine (CYMBALTA) 60 MG capsule Take 1 capsule (60 mg total) by mouth daily    fluticasone (FLONASE) 50 MCG/ACT nasal spray 1 spray by Nasal route daily    clonazePAM (KLONOPIN) 0.5 MG tablet Take 1 tablet (0.5 mg total) by mouth 2 times daily as needed   Max daily dose: 1 mg    FREESTYLE LITE test strip Use four times daily as directed for 250.02    blood glucose monitor system Brand: cheapest brand available per her insurance.  Use as directed.    lancets Brand Free Style Lite; Use 2 times per day as directed for blood glucose testing.    atenolol (TENORMIN) 25 MG tablet Take 25 mg by mouth daily    Alcohol Swabs (ALCOHOL WIPES) PADS Use BID for BG check    cetirizine (ZYRTEC) 10 MG tablet Take 10 mg by mouth daily    levonorgestrel (MIRENA) 20 MCG/24HR IUD 1 each by Intrauterine route once    Non-System Medication The  above patient is followed in our clinic and cannot resume work permanently.    VENTOLIN HFA 108 (90 BASE) MCG/ACT inhaler Inhale 1-2 puffs into the lungs every 4-6 hours as needed for Wheezing    promethazine (PHENERGAN) 12.5 MG tablet Take 1 tablet (12.5 mg total) by mouth 4 times daily as needed    SUMAtriptan (IMITREX) 50 MG tablet Take 1 tablet (50 mg total) by mouth as needed for Migraine   Take at onset of headache. May repeat once in 2 hours.    famciclovir (FAMVIR) 500 MG tablet Take 1 tablet (500 mg total) by mouth 3 times daily as needed     No current facility-administered medications for this visit.        Side Effects  Patient Reported Side Effects: None reported    Mental Status  APPEARANCE: Casual  ATTITUDE TOWARD INTERVIEWER: Cooperative  MOTOR ACTIVITY: WNL (within normal limits)  EYE CONTACT: Direct  SPEECH: Normal rate and tone  AFFECT: Full Range and Appropriate  MOOD: Euthymic  THOUGHT PROCESS: Normal  THOUGHT CONTENT: No unusual themes  PERCEPTION: Within normal limits  ORIENTATION: Alert and Oriented X 3.  CONCENTRATION: Good  MEMORY:   Recent: intact   Remote: intact  COGNITIVE FUNCTION: Average intelligence  JUDGMENT: Intact  IMPULSE CONTROL: Fair "I'm still not good with the junk food."    INSIGHT: Fair    Risk Assessment    Self Injury: Patient Denies  Suicidal Ideation: Patient Denies  Homicidal Ideation: Patient Denies  Aggressive Behavior: Patient Denies    Results  none    BP Readings from Last 3 Encounters:   04/15/16 133/67   03/04/16 129/70   02/16/16 137/88       Assessment  FORMULATION: Patient's self report that her mood is better is consistent with the assessments which are in the mild range for both depression and anxiety.  We discussed that lowering the Abilify seems to have helped her mood to which she agreed.  She still feels like the Lamictal and Buspar are the most helpful to her mood and Mirtazapine for her sleep.  We discussed lowering the Abilify again to which she  agreed.  She also does not believe the Cymbalta has done much for the pain.  Marland Kitchen  Recommendations/Plan and Rationale  PLAN: Decrease Abilify to 5 mg 1/2 tablet daily.  Continue other medications.  Return in 4 weeks.

## 2016-04-17 ENCOUNTER — Telehealth: Payer: Self-pay

## 2016-04-17 ENCOUNTER — Encounter: Payer: Self-pay | Admitting: Gastroenterology

## 2016-04-17 NOTE — Telephone Encounter (Signed)
Routed to provider as FYI

## 2016-04-17 NOTE — Telephone Encounter (Signed)
Blanch Media calling from [facility]. She reports that the patient had syncope episode on Monday. The patient did fall but didn't injure herself. If you have any questions please give Blanch Media a call at  [phone].

## 2016-04-23 ENCOUNTER — Other Ambulatory Visit: Payer: Self-pay | Admitting: Gastroenterology

## 2016-04-23 NOTE — Progress Notes (Signed)
[facility]   Office Note    Primary Care Physician:   [name], MD   Patient Name: Kaylee Harris  04/23/2016      Subjective: 40 y.o. female was seen in our office today for follow up of diarrhea and abdominal pain x8 months per patient. She is s/p right hemicolectomy for diverticulitis Kaylee Harris) in 2015.     Kaylee Harris underwent colonoscopy 02/2016 which was unremarkable endoscopically, random biopsies also negative for evidence of microscopic colitis. She is currently on Dicyclomine 54m QID and Lomotil QID PRN. She continues with at least 5 loose to liquid stools associated with urgency and exacerbated post prandially. No bleeding.     Also, Kaylee Harris ongoing right sided abdominal pain which is constant and rated 6/10. This does not vary with need to defecate/following defecation. At times, she feels this is worse when bending forward.     PMH includes reflux, on Pantoprazole and she feels this is well controlled.         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 Lower abdomen with cellulitis changes, wound probed and purulent material expressed.  Had PICC line for "multiple infiltrations" of what? D/c-ed home on 10d of Augmentin 9/11.      Anginal pain     Anxiety     Arthritis     Asthma     Complication of anesthesia     Depression     Diabetes mellitus     Previously on SU and metformin, now diet controlled    Diverticulitis 09/2010    Dysfunctional uterine bleeding     Fibromyalgia     GERD (gastroesophageal reflux disease)     Hyperlipidemia     Migraine     Neuromuscular disorder     POTS (postural orthostatic tachycardia syndrome)     Sebaceous cyst of breast     right axilla    Varicella     Past Surgical History:   Procedure Laterality Date    APPENDECTOMY  2016    arthroscopic shoulder surgery Right 2010    Related to lifting    CARDIAC CATHETERIZATION   09/2011    negative    DILATION AND CURETTAGE OF UTERUS  2005, 2007    x2 for menorraghia    LEFT COLECTOMY  01/03/14    Dr BTresa Res   loop recorder  Feb 03 2014    PR COLONOSCOPY THRU COLOTOMY N/A 02/16/2016    Procedure: COLONOSCOPY;  Surgeon: GKelly Splinter MD;  Location: [facility]  Service: GI    SMALL INTESTINE SURGERY      TONSILLECTOMY         Social History:   Social History   Substance Use Topics    Smoking status: Former Smoker     Packs/day: 0.50     Years: 3.00     Types: Cigarettes    Smokeless tobacco: Never Used    Alcohol use 0.0 oz/week     0 Standard drinks or equivalent per week      Comment: <1 weekly       Family History:   Family History   Problem Relation Age of Onset    Hypertension Father     Diabetes Father     Kidney Disease Father     Elevated lipids Father  Heart attack Father 19    Other Father      PVD    Hypertension Mother     Elevated lipids Mother     Heart attack Mother 2    Diabetes Mother     Eczema Mother     Psoriasis Mother     Hypertension Brother     Heart Disease Brother 59     prinzmetal's angina    Heart Disease Sister      currently having work up    [place] Sister     Hypertension Sister     Breast cancer Maternal Grandmother     Stroke Other         Medications:   Prior to Admission medications    Medication Sig Start Date End Date Taking? Authorizing Provider   busPIRone (BUSPAR) 30 MG tablet Take 1 tablet (30 mg total) by mouth 2 times daily 04/15/16   Corigliano, Suann Larry, NP   mirtazapine (REMERON) 15 MG tablet Take 1 tablet (15 mg total) by mouth nightly 04/15/16   Corigliano, Suann Larry, NP   dicyclomine (BENTYL) 20 MG tablet TAKE 1 TABLET (20 MG TOTAL) BY MOUTH 4 TIMES DAILY (BEFORE MEALS AND NIGHTLY) 04/01/16   [name], MD   lamoTRIgine (LAMICTAL) 150 MG tablet Take 1 tablet (150 mg total) by mouth daily 04/01/16   Mangarelli, Sonny Masters, NP   ARIPiprazole (ABILIFY) 5 MG tablet Take 1 tablet (5 mg  total) by mouth daily  Patient taking differently: Take 2.5 mg by mouth daily    03/04/16   Corigliano, Suann Larry, NP   traMADol (ULTRAM) 50 MG tablet Take 1 tablet (50 mg total) by mouth every 8 hours as needed (for head pain and abd pain)   Max daily dose: 150 mg 02/29/16   [name], MD   VENTOLIN HFA 108 (90 BASE) MCG/ACT inhaler Inhale 1-2 puffs into the lungs every 4-6 hours as needed for Wheezing 01/29/16   [name], MD   ascorbic acid (VITAMIN C) 100 MG tablet Take 100 mg by mouth daily    [provider]   midodrine (PROAMATINE) 10 MG tablet Take 1 tablet (10 mg total) by mouth 3 times daily 01/12/16   Nelta Numbers, MD   insulin syringe-needle U-100 (BD ULTRAFINE) 31G X 5/16" 0.3 ML HALF-UNIT Use 3 times a day as instructed. 12/28/15   Nelta Numbers, MD   diphenoxylate-atropine (LOMOTIL) 2.5-0.025 MG per tablet Take 1 tablet by mouth 4 times daily as needed for Diarrhea   Max daily dose: 4 tablets 12/05/15   Gift, Sherlon Handing, DO   promethazine (PHENERGAN) 12.5 MG tablet Take 1 tablet (12.5 mg total) by mouth 4 times daily as needed 12/04/15   [name], MD   topiramate (TOPAMAX) 100 MG tablet Take 1 tablet (100 mg total) by mouth 2 times daily 11/08/15   [name], MD   SUMAtriptan (IMITREX) 50 MG tablet Take 1 tablet (50 mg total) by mouth as needed for Migraine   Take at onset of headache. May repeat once in 2 hours. 11/08/15   [name], MD   pantoprazole (PROTONIX) 40 MG EC tablet Take 1 tablet (40 mg total) by mouth daily   SWALLOW WHOLE. DO NOT CRUSH, BREAK, OR CHEW. 11/06/15   [name], MD   atorvastatin (LIPITOR) 40 MG tablet Take 1 tablet (40 mg total) by mouth daily (with dinner) 10/25/15   [name], MD   Insulin Disposable Pump (V-GO  30) KIT Use daily; 30ct/month. 10/11/15   [name], MD   insulin lispro (HUMALOG) 100 UNIT/ML injection vial For VGO: Dinner 74Q (6 clicks) ; B & L 4- 6 units. BG 100 - 150 8u (4clk), 151 - 200 10U (5clk), >201 12U ( 6 ck).  MDD 72U (36 clk) 09/13/15   [name], MD   TRULICITY 5.95 GL/8.7FI SOPN INJECT 0.75 MG INTO THE SKIN ONCE A WEEK 08/31/15   [name], MD   DULoxetine (CYMBALTA) 60 MG capsule Take 1 capsule (60 mg total) by mouth daily 04/25/15   Corigliano, Suann Larry, NP   fluticasone Asencion Islam) 50 MCG/ACT nasal spray 1 spray by Nasal route daily 04/18/15   Sheliah Mends, MD   clonazePAM (KLONOPIN) 0.5 MG tablet Take 1 tablet (0.5 mg total) by mouth 2 times daily as needed   Max daily dose: 1 mg 11/30/14   Corigliano, Suann Larry, NP   FREESTYLE LITE test strip Use four times daily as directed for 250.02 11/21/14   [name], MD   blood glucose monitor system Brand: cheapest brand available per her insurance.  Use as directed. 11/15/14   Woodward Ku, NP   lancets Brand Free Style Lite; Use 2 times per day as directed for blood glucose testing. 11/15/14   Woodward Ku, NP   atenolol (TENORMIN) 25 MG tablet Take 25 mg by mouth daily    [provider]   Alcohol Swabs (ALCOHOL WIPES) PADS Use BID for BG check 10/31/14   [name], MD   famciclovir [facility]) 500 MG tablet Take 1 tablet (500 mg total) by mouth 3 times daily as needed 10/11/14   [name], MD   cetirizine (ZYRTEC) 10 MG tablet Take 10 mg by mouth daily    [provider]   levonorgestrel (MIRENA) 20 MCG/24HR IUD 1 each by Intrauterine route once    [provider]   Non-System Medication The above patient is followed in our clinic and cannot resume work permanently. 02/15/13   Lance Bosch, NP       Allergies:   Allergies   Allergen Reactions    Morphine Anaphylaxis    Toradol [Ketorolac Tromethamine] Anaphylaxis     Notes feeling tightness in her throat and full body itching after last dose a month ago. Also notes erythema tracking along her vein after injection. This is not reflected by documentation.  Tolerates Naproxen.    Trazodone Anaphylaxis    Morphine Other (See Comments)     unknown    Toradol [Ketorolac  Tromethamine] Other (See Comments)     unknown    Trazodone Other (See Comments)     unknown       Objective:  Vital Signs: wt 287lb, pulse 76, BP 130/86    General appearance: alert, appears stated age and cooperative  Head: Normocephalic, without obvious abnormality, atraumatic  Eyes: conjunctivae/corneas clear, anicteric  Neck: supple, symmetrical, trachea midline  Lungs: clear to auscultation bilaterally  Heart: regular rate and rhythm, S1, S2 normal, no murmur, click, rub or gallop  Abdomen: soft, ; bowel sounds normal; no masses,  no organomegaly. Focal TTP right mid abdomen and RUQ.   Extremities: extremities normal, atraumatic, no cyanosis or edema  Rectal: Deferred at this time  Neurologic: Grossly normal  MSK: Grossly unremarkable, uses RW  Lymph: No appreciable lymphadenopathy    Lab Results: Lab Results   Component Value Date    WBC 15.7 (H) 03/14/2016    HGB 12.2  03/14/2016    HCT 40 03/14/2016    MCV 86 03/14/2016    PLT 383 03/14/2016          Lab results: 03/14/16  0221   Sodium 142   Potassium 3.7   Chloride 102   CO2 28   UN 14   Creatinine 0.9   GFR,Caucasian >60   GFR,Black >60   Glucose 198*   Calcium 9.8        Lab Results   Component Value Date    ALT 19 02/28/2016    AST 20 02/28/2016     Lab Results   Component Value Date    TB 0.2 (L) 02/28/2016      Lab Results   Component Value Date    ALK 128 (H) 02/28/2016      Lab Results   Component Value Date    LIP 39 01/04/2016    AMY 46 01/04/2016      No results for input(s): PTI, INR in the last 8760 hours.      Imaging:   No results found.        Assessment/Plan: 40 y.o. female presents for follow up of RUQ abdominal pain and post prandial diarrhea. She is s/p left hemicolectomy 2015 for diverticular disease. No improvement appreciated with high dose antispasmodic and antidiarrheals to date. CT 11/2015 unremarkable, Colonoscopy 02/2016 also unremarkable.     Ddx: bile overload, functional diarrhea, IBS, possible medication SE (minimal  improvement with cessation of Metformin), SIBO, etc.     1. Questran 1 packet twice daily  2. Avoid fatty/greasy foods  3. OK to use Dicyclomine 35m 3-4 times daily  4. OK to use Lomotil as needed  5. If no improvement, consider Xifaxan course.   Med New        :  Cholestyramine 4 GM/Dose                         take one packet twice daily widely                         spaced from other medications    Follow Up      :  8 weeks, call me with update in 2 weeks.         Thank you for allowing me to participate in this patient's care.  Please do not hesistate to contact [facility] any questions or concerns.    Office #: [phone] Office Fax: [phone]   Electronically Signed by:  JMendel Corning PA  Note created: 04/23/2016  at: 4:01 PM

## 2016-04-25 ENCOUNTER — Telehealth: Payer: Self-pay

## 2016-04-25 ENCOUNTER — Ambulatory Visit: Payer: Medicare (Managed Care) | Attending: Primary Care | Admitting: Registered Nurse

## 2016-04-25 DIAGNOSIS — E1165 Type 2 diabetes mellitus with hyperglycemia: Secondary | ICD-10-CM | POA: Insufficient documentation

## 2016-04-25 DIAGNOSIS — IMO0002 Reserved for concepts with insufficient information to code with codable children: Secondary | ICD-10-CM

## 2016-04-25 MED ORDER — INSULIN GLARGINE 300 UNIT/ML SC SOPN *I*
30.0000 [IU] | PEN_INJECTOR | Freq: Every evening | SUBCUTANEOUS | 0 refills | Status: DC
Start: 2016-04-25 — End: 2016-12-19

## 2016-04-25 NOTE — Addendum Note (Signed)
Addended by: Grandville Silos on: 04/25/2016 05:09 PM     Modules accepted: Orders

## 2016-04-25 NOTE — Progress Notes (Signed)
Leata Mouse 40 y.o. female was seen today for diabetes education regarding  uncontrolled type 2 diabetes mellitus. Last seen 12/20/15 for DSMT & new to  VGO insulin system + Trulicity injection teaching.  individual visits; new to Medicare as of 07/17/15 & initial year of DSMT.  Hospitalized at [facility] until late 03/2016.  Currently in donut hole for Medicare.     Diabetes Meds: reviewed in eRecord; patient is taking as prescribed. Trulicity [id number] mg q week;Humalog vial in VGO  30 insulin delivery system (1 click = 2 units); recall B 4 & L 4-5 clicks; dinner 6 clicks.  TDD 62 units    Lab Results   Component Value Date    HA1C 7.3 (H) 12/27/2015    HA1C 7.4 (H) 09/22/2015    HA1C 9.0 (H) 07/10/2015    MALBR 0.31 09/11/2015    CREAT 0.9 03/14/2016    LDLC 60 12/27/2015     Self glucose monitoring:  [name]  BG meter 3 times daily. fbs 140 - 202; AC meals 152 - 98; hs 156     BG today:  208  mg/dl 1 1/2 PP meal & took 4 clicks (8 units)       Diet: 3 meals per day wt loss per pt 283 lbs from 300 lbs ; less hungry  Current Activity  using walker  Smoking: no Alcohol: no rare     Self foot check:  self care  Eye Exam inquiry: due 05/2016    Assessment:   Describes proper skill with filling of VGO device & skin placement on abdomen  Currently using samples of VGO20 until 05/06/2016 & will be out of donut  [facility] & can return to [employer] term as of 05/01/16,  Will have more Vgo; will ask PCP re: sample pens of [place] as base & she can use syringe/vial Humalog for bolus doses  Partner present & both demonstrate proper understanding of basal / bolus system  2 units = 1 click  Will cont  4 - 6 clicks (AC B & L);  Largest meal dinner, 6 clicks  Instructed correction dosing if bG 150 or >; 1 to 30 correction factor  Will bolus for snacks; 1 to 7 gms   Due to mental health, feels cannot participate in group DM education & benefits individual       Coun/Edu:     BARRIERS THAT AFFECT LEARNING:  Mental health ; followed by  psych  READINESS TO LEARN:  good  PREFERRED METHODS OF LEARNING:  reading, demonstration and visual tools  EDUCATION PROVIDED: insulin teaching, BG Goals, using ICR and CF to determine meal bolus and future CGM; risk reduction; skin care with VGO    Plan:     Patient Instructions   1.  Short term until Medicare starts to cover Vgo30        Consider basal insuiln pen injection 30 units daily with humalog syringe/vial (1 click = 2units)   8 units with breakfast & lunch; 12 units with dinner    2.  05/06/2016,  Return to VGO 30 units (AM)   Clicks [id number]    3. Cont Trulicity weekly    4.  Labs prior to 05/13/16 Dr Laurance Flatten appt    DSMT SUPPORT PLAN:  Take advantage of your annual Medicare benefits for DSMT and MNT  Follow-up visit:  06/2016 at [facility] location  Time spent counseling patient: 30 minutes    Medicare initial MD order in eRecord 07/17/15.  Expires 07/16/16  Initial year DSMT for Medicare  DSMT  individual 1/2 hr;  7 1/2  individual hrs remaining    Garrison Michie C Jayme Mednick, RN CDE

## 2016-04-25 NOTE — Telephone Encounter (Cosign Needed)
Clinical Management Note   Writer spoke with PT regarding referral for group therapy made by Therapist Tresa Res.  Writer offered to schedule PT for an intake with Erin when she declined to be in group at this time.

## 2016-04-25 NOTE — Addendum Note (Signed)
Addended by: Grandville Silos on: 04/25/2016 05:07 PM     Modules accepted: Orders

## 2016-04-25 NOTE — Patient Instructions (Signed)
1.  Short term until Medicare starts to cover Vgo30        Consider basal insuiln pen injection 30 units daily with humalog syringe/vial (1 click = 2units)   8 units with breakfast & lunch; 12 units with dinner    2.  05/06/2016,  Return to VGO 30 units (AM)   Clicks [id number]    3. Cont Trulicity weekly    4.  Labs prior to 05/13/16 Dr Laurance Flatten appt

## 2016-04-26 ENCOUNTER — Encounter: Payer: Self-pay | Admitting: Gastroenterology

## 2016-04-28 ENCOUNTER — Other Ambulatory Visit: Payer: Self-pay | Admitting: Psychiatry

## 2016-04-30 ENCOUNTER — Other Ambulatory Visit: Payer: Self-pay | Admitting: Psychiatry

## 2016-05-01 ENCOUNTER — Ambulatory Visit: Payer: Self-pay

## 2016-05-01 ENCOUNTER — Telehealth: Payer: Self-pay

## 2016-05-01 NOTE — Telephone Encounter (Signed)
Pt called to cancel appt on12/27 at 2 pm and r/s for 05/15/16 at 3:45 pm.  Pt also requested a callback [phone].

## 2016-05-01 NOTE — Telephone Encounter (Signed)
Phone call from patient requesting a refill on her Lamotrigine(Lamictal).  Informed patient, prescription renewal was sent yesterday to [facility] on [address] by provider.  Patient indicated, in the future, she will be using [facility] on [address]   Writer reviewed the list of the pharmacies in chart with patient and she would like to keep the other three as she goes to those pharmacies for medical medications.

## 2016-05-08 ENCOUNTER — Other Ambulatory Visit: Payer: Self-pay | Admitting: Primary Care

## 2016-05-09 ENCOUNTER — Other Ambulatory Visit: Payer: Self-pay | Admitting: Psychiatry

## 2016-05-13 ENCOUNTER — Encounter: Payer: Self-pay | Admitting: Primary Care

## 2016-05-13 ENCOUNTER — Encounter: Payer: Self-pay | Admitting: Psychiatry

## 2016-05-13 ENCOUNTER — Ambulatory Visit: Payer: Self-pay | Admitting: Psychiatry

## 2016-05-13 ENCOUNTER — Ambulatory Visit: Payer: Self-pay | Admitting: Primary Care

## 2016-05-13 VITALS — BP 120/84 | HR 88 | Temp 97.8°F | Ht 67.99 in | Wt 291.0 lb

## 2016-05-13 VITALS — BP 126/59 | HR 85 | Resp 18 | Ht 67.99 in | Wt 289.0 lb

## 2016-05-13 DIAGNOSIS — D72829 Elevated white blood cell count, unspecified: Secondary | ICD-10-CM

## 2016-05-13 DIAGNOSIS — IMO0002 Reserved for concepts with insufficient information to code with codable children: Secondary | ICD-10-CM

## 2016-05-13 DIAGNOSIS — F32A Depression, unspecified: Secondary | ICD-10-CM

## 2016-05-13 DIAGNOSIS — F422 Mixed obsessional thoughts and acts: Secondary | ICD-10-CM

## 2016-05-13 DIAGNOSIS — F4310 Post-traumatic stress disorder, unspecified: Secondary | ICD-10-CM

## 2016-05-13 DIAGNOSIS — F603 Borderline personality disorder: Secondary | ICD-10-CM

## 2016-05-13 DIAGNOSIS — K529 Noninfective gastroenteritis and colitis, unspecified: Secondary | ICD-10-CM

## 2016-05-13 DIAGNOSIS — Z23 Encounter for immunization: Secondary | ICD-10-CM

## 2016-05-13 LAB — HM DIABETES FOOT EXAM

## 2016-05-13 NOTE — Progress Notes (Addendum)
[facility]    SUBJECTIVE    Pt is here to discuss:    Chief Complaint   Patient presents with    Diabetes     1. DM - Adherent with medications. Worried about cost of VGO, back to paying for humalog (not as expensive now that new year, but will cost her). Does check BGs 4x daily, 118-150s fasting. One 202. DHS was suggesting dose increase in trulicity. Denies polyuria, polydipsia or hypoglycemia sx. Denies peripheral paresthesias. Does check feet, denies foot lesions or concerns.  Lab Results   Component Value Date    HA1C 7.3 (H) 12/27/2015    HA1C 7.4 (H) 09/22/2015    HA1C 9.0 (H) 07/10/2015    MALBR 0.31 09/11/2015    CREAT 0.9 03/14/2016    LDLC 60 12/27/2015         2. Abd pain and chronic diarrhea - on augmentin x2d in effort to treat SBO. Noting worsening diarrhea. If no improvement with treatment, per pt plan is to consider lap abdominal surgery to eval for adhesions causing pain and diarrhea.     3. Depression - doing dose reduction with plan to d/c abilify    PMH / Family Hx / Social Hx  Patient's medications, allergies, problem list, past medical, social histories were reviewed and notable for:    Current Outpatient Prescriptions   Medication Sig Note    amoxicillin-clavulanate (AUGMENTIN) 875-125 MG per tablet  05/13/2016: Received from: External Pharmacy    atorvastatin (LIPITOR) 40 MG tablet TAKE 1 TABLET BY MOUTH EVERY DAY WITH DINNER     lamoTRIgine (LAMICTAL) 150 MG tablet TAKE 1 TABLET (150 MG TOTAL) BY MOUTH DAILY     insulin glargine 300 UNIT/ML SOPN Inject 30 Units into the skin nightly   FREE voucher will be used; DO NOT bill Insurance     busPIRone (BUSPAR) 30 MG tablet Take 1 tablet (30 mg total) by mouth 2 times daily     mirtazapine (REMERON) 15 MG tablet Take 1 tablet (15 mg total) by mouth nightly     dicyclomine (BENTYL) 20 MG tablet TAKE 1 TABLET (20 MG TOTAL) BY MOUTH 4 TIMES DAILY (BEFORE MEALS AND NIGHTLY)     traMADol (ULTRAM) 50 MG tablet Take 1 tablet (50 mg  total) by mouth every 8 hours as needed (for head pain and abd pain)   Max daily dose: 150 mg     VENTOLIN HFA 108 (90 BASE) MCG/ACT inhaler Inhale 1-2 puffs into the lungs every 4-6 hours as needed for Wheezing     ascorbic acid (VITAMIN C) 100 MG tablet Take 100 mg by mouth daily     midodrine (PROAMATINE) 10 MG tablet Take 1 tablet (10 mg total) by mouth 3 times daily     insulin syringe-needle U-100 (BD ULTRAFINE) 31G X 5/16" 0.3 ML HALF-UNIT Use 3 times a day as instructed.     diphenoxylate-atropine (LOMOTIL) 2.5-0.025 MG per tablet Take 1 tablet by mouth 4 times daily as needed for Diarrhea   Max daily dose: 4 tablets     promethazine (PHENERGAN) 12.5 MG tablet Take 1 tablet (12.5 mg total) by mouth 4 times daily as needed     topiramate (TOPAMAX) 100 MG tablet Take 1 tablet (100 mg total) by mouth 2 times daily     SUMAtriptan (IMITREX) 50 MG tablet Take 1 tablet (50 mg total) by mouth as needed for Migraine   Take at onset of headache. May repeat once in  2 hours.     pantoprazole (PROTONIX) 40 MG EC tablet Take 1 tablet (40 mg total) by mouth daily   SWALLOW WHOLE. DO NOT CRUSH, BREAK, OR CHEW.     Insulin Disposable Pump (V-GO 30) KIT Use daily; 30ct/month.     insulin lispro (HUMALOG) 100 UNIT/ML injection vial For VGO: Dinner 29B (6 clicks) ; B & L 4- 6 units. BG 100 - 150 8u (4clk), 151 - 200 10U (5clk), >201 12U ( 6 ck). MDD 72U (36 clk)     TRULICITY 2.84 XL/2.4MW SOPN INJECT 0.75 MG INTO THE SKIN ONCE A WEEK     DULoxetine (CYMBALTA) 60 MG capsule Take 1 capsule (60 mg total) by mouth daily     fluticasone (FLONASE) 50 MCG/ACT nasal spray 1 spray by Nasal route daily     clonazePAM (KLONOPIN) 0.5 MG tablet Take 1 tablet (0.5 mg total) by mouth 2 times daily as needed   Max daily dose: 1 mg     FREESTYLE LITE test strip Use four times daily as directed for 250.02     blood glucose monitor system Brand: cheapest brand available per her insurance.  Use as directed.     lancets Brand  Free Style Lite; Use 2 times per day as directed for blood glucose testing.     atenolol (TENORMIN) 25 MG tablet Take 25 mg by mouth daily     Alcohol Swabs (ALCOHOL WIPES) PADS Use BID for BG check     famciclovir (FAMVIR) 500 MG tablet Take 1 tablet (500 mg total) by mouth 3 times daily as needed     cetirizine (ZYRTEC) 10 MG tablet Take 10 mg by mouth daily     levonorgestrel (MIRENA) 20 MCG/24HR IUD 1 each by Intrauterine route once     Non-System Medication The above patient is followed in our clinic and cannot resume work permanently.        ROS  Mild wt gain since september    OBJECTIVE  Vitals:    05/13/16 1626   BP: 120/84   Pulse: 88   Temp: 36.6 C (97.8 F)   TempSrc: Temporal   Weight: 132 kg (291 lb)   Height: 1.727 m (5' 7.99")     Body mass index is 44.26 kg/(m^2).      General: well-appearing obese Caucasian female, pleasant & conversant, in NAD  Feet: No evidence of injury, infection or ischemia b/l. DP and TP pulses 2+ b/l. Sensation to monofilament testing is intact.  Psych: AAOx3, normal affect and mood. Insight and judgement intact.           ASSESSMENT & PLAN  1. DM (diabetes mellitus), type 2, uncontrolled  Will update a1c and f/u via mychart. If a1c worsening >7 or 7.5, can consider dose increase in trulicity. Continue VGO as pt has had best success with this. Continue monitoring BGs 4x daily.  - Hemoglobin A1c; Future    2. Leukocytosis, unspecified type  Updated CBC with labwork  - CBC and differential; Future    3. Chronic diarrhea  4. Depression, unspecified depression type  Monitor for improvement with abx treatment. Consider polypharmacy as cause. Pt did not tolerate dose reduction of buspirone or lamictal. In the past we have tried to wean PPI but had recurrent GERD. COnsider reattempt with lower PPI use following SBO treatment and before surgery attempted. Encouraged to monitor sx also with d/c of abilify        5. Need for immunization against influenza  Pt denies current  illness/fever, recent vaccine in last 4 weeks, allergy to egg or flu vaccine component, h/o Guillain-Barre or current pregnancy. I have educated the patient and/or parent/guardian/designee about each component in all the immunizations and toxoids that are being given today, have reviewed potential vaccine side effects and have answered their questions about the vaccinations.   - Flu Vaccine Quadrivalent greater than or equal to 3yo preservative free IM (AMBULATORY USE ONLY)        Follow-up: 73more:DM      JJohny Drilling MD  [facility]  05/13/2016  4:38 PM        ______________________

## 2016-05-13 NOTE — Addendum Note (Signed)
Addended by: [name] Freer on: 05/13/2016 02:38 PM     Modules accepted: Orders

## 2016-05-13 NOTE — Progress Notes (Signed)
Behavioral Health Psychopharmacology Follow-up     Length of Session: 20 minutes.    Diagnosis Addressed    ICD-10-CM ICD-9-CM   1. Borderline personality disorder F60.3 301.83   2. Mixed obsessional thoughts and acts F42.2 300.3   3. PTSD (post-traumatic stress disorder) F43.10 309.81       Recent History and Response to Medications  Patient states: she feels better overall.  She says that there is a situation that she knows there is a situation she will have to work though where her wife's grandmother is going into the place she used to work.    HPI     Follow-up    Additional comments: patient is here for medication evaluation       Last edited by Randell Loop, RN on 05/13/2016  1:59 PM. (History)          Current use of alcohol or drugs: No        Neurovegetative Symptoms Review:  Energy level: fair  Concentration: fair  Sleep Quality: fair   Number of hours : 6  Appetite: fair     Wt Readings from Last 3 Encounters:   05/13/16 131.1 kg (289 lb)   04/15/16 131.1 kg (289 lb)   03/04/16 128.4 kg (283 lb)     Enjoyment/interest: fair    Current Medications  Current Outpatient Prescriptions   Medication Sig    amoxicillin-clavulanate (AUGMENTIN) 875-125 MG per tablet     atorvastatin (LIPITOR) 40 MG tablet TAKE 1 TABLET BY MOUTH EVERY DAY WITH DINNER    lamoTRIgine (LAMICTAL) 150 MG tablet TAKE 1 TABLET (150 MG TOTAL) BY MOUTH DAILY    insulin glargine 300 UNIT/ML SOPN Inject 30 Units into the skin nightly   FREE voucher will be used; DO NOT bill Insurance    busPIRone (BUSPAR) 30 MG tablet Take 1 tablet (30 mg total) by mouth 2 times daily    mirtazapine (REMERON) 15 MG tablet Take 1 tablet (15 mg total) by mouth nightly    dicyclomine (BENTYL) 20 MG tablet TAKE 1 TABLET (20 MG TOTAL) BY MOUTH 4 TIMES DAILY (BEFORE MEALS AND NIGHTLY)    ARIPiprazole (ABILIFY) 5 MG tablet Take 1 tablet (5 mg total) by mouth daily (Patient taking differently: Take 2.5 mg by mouth daily   )    traMADol (ULTRAM) 50 MG tablet  Take 1 tablet (50 mg total) by mouth every 8 hours as needed (for head pain and abd pain)   Max daily dose: 150 mg    ascorbic acid (VITAMIN C) 100 MG tablet Take 100 mg by mouth daily    midodrine (PROAMATINE) 10 MG tablet Take 1 tablet (10 mg total) by mouth 3 times daily    diphenoxylate-atropine (LOMOTIL) 2.5-0.025 MG per tablet Take 1 tablet by mouth 4 times daily as needed for Diarrhea   Max daily dose: 4 tablets    topiramate (TOPAMAX) 100 MG tablet Take 1 tablet (100 mg total) by mouth 2 times daily    pantoprazole (PROTONIX) 40 MG EC tablet Take 1 tablet (40 mg total) by mouth daily   SWALLOW WHOLE. DO NOT CRUSH, BREAK, OR CHEW.    Insulin Disposable Pump (V-GO 30) KIT Use daily; 30ct/month.    insulin lispro (HUMALOG) 100 UNIT/ML injection vial For VGO: Dinner 94R (6 clicks) ; B & L 4- 6 units. BG 100 - 150 8u (4clk), 151 - 200 10U (5clk), >201 12U ( 6 ck). MDD 74Y (36 clk)    TRULICITY  0.75 MG/0.5ML SOPN INJECT 0.75 MG INTO THE SKIN ONCE A WEEK    DULoxetine (CYMBALTA) 60 MG capsule Take 1 capsule (60 mg total) by mouth daily    fluticasone (FLONASE) 50 MCG/ACT nasal spray 1 spray by Nasal route daily    FREESTYLE LITE test strip Use four times daily as directed for 250.02    blood glucose monitor system Brand: cheapest brand available per her insurance.  Use as directed.    lancets Brand Free Style Lite; Use 2 times per day as directed for blood glucose testing.    atenolol (TENORMIN) 25 MG tablet Take 25 mg by mouth daily    Alcohol Swabs (ALCOHOL WIPES) PADS Use BID for BG check    cetirizine (ZYRTEC) 10 MG tablet Take 10 mg by mouth daily    levonorgestrel (MIRENA) 20 MCG/24HR IUD 1 each by Intrauterine route once    Non-System Medication The above patient is followed in our clinic and cannot resume work permanently.    VENTOLIN HFA 108 (90 BASE) MCG/ACT inhaler Inhale 1-2 puffs into the lungs every 4-6 hours as needed for Wheezing    insulin syringe-needle U-100 (BD ULTRAFINE) 31G  X 5/16" 0.3 ML HALF-UNIT Use 3 times a day as instructed.    promethazine (PHENERGAN) 12.5 MG tablet Take 1 tablet (12.5 mg total) by mouth 4 times daily as needed    SUMAtriptan (IMITREX) 50 MG tablet Take 1 tablet (50 mg total) by mouth as needed for Migraine   Take at onset of headache. May repeat once in 2 hours.    clonazePAM (KLONOPIN) 0.5 MG tablet Take 1 tablet (0.5 mg total) by mouth 2 times daily as needed   Max daily dose: 1 mg    famciclovir (FAMVIR) 500 MG tablet Take 1 tablet (500 mg total) by mouth 3 times daily as needed     No current facility-administered medications for this visit.        Side Effects  Patient Reported Side Effects: None reported    Mental Status  APPEARANCE: Casual  ATTITUDE TOWARD INTERVIEWER: Cooperative  MOTOR ACTIVITY: WNL (within normal limits)  EYE CONTACT: Direct  SPEECH: Normal rate and tone  AFFECT: Full Range and Appropriate  MOOD: Euthymic  THOUGHT PROCESS: Normal  THOUGHT CONTENT: No unusual themes  PERCEPTION: Within normal limits  ORIENTATION: Alert and Oriented X 3.  CONCENTRATION: Good  MEMORY:   Recent: intact   Remote: intact  COGNITIVE FUNCTION: Average intelligence  JUDGMENT: Intact  IMPULSE CONTROL: Good  INSIGHT: Good    Risk Assessment    Self Injury: Patient Denies  Suicidal Ideation: Patient Denies  Homicidal Ideation: Patient Denies  Aggressive Behavior: Patient Denies    Results  none    BP Readings from Last 3 Encounters:   05/13/16 126/59   04/15/16 133/67   03/04/16 129/70       Assessment  FORMULATION: Patient's score on both assessments have actually decreased since her Abilify was reduced.  We discussed using the skills of effectiveness to deal with the situation with her wife's grandmother.  We discussed stopping the Abilify completely and reassessing her in 4 weeks to which she agreed.  Writer will not change any of the other medications.    Recommendations/Plan and Rationale  PLAN: Discontinue Abilify. Continue other medications.  Return in  4 weeks.

## 2016-05-14 ENCOUNTER — Other Ambulatory Visit: Payer: Self-pay | Admitting: Primary Care

## 2016-05-15 ENCOUNTER — Ambulatory Visit: Payer: Self-pay

## 2016-05-15 ENCOUNTER — Encounter: Payer: Self-pay | Admitting: Primary Care

## 2016-05-15 ENCOUNTER — Other Ambulatory Visit: Payer: Self-pay | Admitting: Family Medicine

## 2016-05-15 DIAGNOSIS — F429 Obsessive-compulsive disorder, unspecified: Secondary | ICD-10-CM

## 2016-05-15 DIAGNOSIS — F329 Major depressive disorder, single episode, unspecified: Secondary | ICD-10-CM

## 2016-05-15 DIAGNOSIS — F603 Borderline personality disorder: Secondary | ICD-10-CM

## 2016-05-15 DIAGNOSIS — F4310 Post-traumatic stress disorder, unspecified: Secondary | ICD-10-CM

## 2016-05-15 MED ORDER — ALBUTEROL SULFATE HFA 108 (90 BASE) MCG/ACT IN AERS *I*
1.0000 | INHALATION_SPRAY | Freq: Four times a day (QID) | RESPIRATORY_TRACT | 5 refills | Status: DC | PRN
Start: 2016-05-15 — End: 2016-11-07

## 2016-05-15 MED ORDER — FLUTICASONE PROPIONATE 50 MCG/ACT NA SUSP *I*
1.0000 | Freq: Every day | NASAL | 3 refills | Status: DC
Start: 2016-05-15 — End: 2016-08-15

## 2016-05-15 NOTE — Addendum Note (Signed)
Addended by: Grandville Silos on: 05/15/2016 08:31 AM     Modules accepted: Orders

## 2016-05-15 NOTE — BH Treatment Plan (Signed)
[facility]     Date of Plan:   Created/Updated On 05/15/2016   FROM 05/15/2016      Created/Updated On 05/15/2016   Next Treatment Plan Due 08/12/2016       Diagnostic Impression    ICD-10-CM ICD-9-CM   1. MDD (major depressive disorder) F32.9 296.20   2. Obsessive-compulsive disorder, unspecified type F42.9 300.3   3. Borderline personality disorder F60.3 301.83   4. PTSD (post-traumatic stress disorder) F43.10 309.81       Strengths  Strengths derived from the assessment include: Patient is hard working, has supports.    Problem Areas  *At least one problem must be targeted toward risk reduction if Formulation of Risk or any other previous exam indicated special monitoring or intervention for suicide and/or violence risk indicated.    PROBLEM AREAS (choose and describe relevant):  THOUGHT: Negative  MOOD:Depressed and anxious  BEHAVIOR: Reactive and avoidant  ECONOMIC:SSDI  ________________________________________________________________  Treatment Problem #1 05/15/2016   Patient Identified Problem Depression and anxiety        Treatment Goal #1 05/15/2016   Patient Identified Goal Learn DBT and PST skills to manage affect       The rationale for addressing this problem is that resolving it will (select all that apply):  Reduce symptoms of disorder, Reduce functional impairment associated with disorder, Decrease likelihood of hospitalization, Facilitate transfer skills learned in therapy to everday life, Is a key motivational factor for the patient's participation in treatment, Reduce risk for suicide* and Reduce risk for violence*      Progress toward goal(s): Problem resolving. Comment: Patient has maintained intermittent therapy appointments with no issues. Patient is ready to end this episode of care with stable mood, thoughts, and behaviors.    1 a. Measurable Objectives : Patient will make and keep regular individual therapy appointments.   Date established: 05/31/14   Target date:  08/12/16   Attained or Revised? continue    1 b. Measurable Objectives : Patient will continue to learn and utilize skills to manage affect.   Date established: 05/31/14   Target date: 08/12/16   Attained or Revised? continue    ______________________________________________________________________      Plan  TREATMENT MODALITIES:  Individual psychotherapy for 30-60 min Q 1-3 weeks with Tresa Res, LCSW.  Psychopharmacology visits 20-45 min Q 2-8 weeks with [name], NP.     DISCHARGE CRITERIA for this treatment setting: Patient reports a reduction in symptoms as evidenced by PHQ-9 and GAD-7 scores.    Clinician's name: Tresa Res, LCSW    Psychiatrist's Name: Donny Pique, MD      Patient/Family Statement  PATIENT/FAMILY STATEMENT:  Obtain patient and family input into the treatment plan, including areas of agreement / disagreement.  Obtain patient's signature - if not possible, briefly describe the reason.     Patient Comments:          I HAVE PARTICIPATED IN THE DEVELOPMENT OF THIS TREATMENT PLAN AND I AGREE WITH ITS CONTENTS:       Patient Signature: _______________________________________________________    Date: ______________________________
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
## 2016-05-16 ENCOUNTER — Other Ambulatory Visit: Payer: Self-pay | Admitting: Primary Care

## 2016-05-16 MED ORDER — TOPIRAMATE 100 MG PO TABS *I*
100.0000 mg | ORAL_TABLET | Freq: Two times a day (BID) | ORAL | 5 refills | Status: DC
Start: 2016-05-16 — End: 2016-11-15

## 2016-05-17 ENCOUNTER — Other Ambulatory Visit: Payer: Self-pay

## 2016-05-17 MED ORDER — PANTOPRAZOLE SODIUM 40 MG PO TBEC *I*
40.0000 mg | DELAYED_RELEASE_TABLET | Freq: Every day | ORAL | 5 refills | Status: DC
Start: 2016-05-17 — End: 2016-11-21

## 2016-05-21 ENCOUNTER — Other Ambulatory Visit
Admission: RE | Admit: 2016-05-21 | Discharge: 2016-05-21 | Disposition: A | Discharge status: Home / Self Care | Payer: Self-pay | Source: Ambulatory Visit | Attending: Primary Care | Admitting: Primary Care

## 2016-05-21 DIAGNOSIS — IMO0002 Reserved for concepts with insufficient information to code with codable children: Secondary | ICD-10-CM

## 2016-05-21 DIAGNOSIS — D72829 Elevated white blood cell count, unspecified: Secondary | ICD-10-CM

## 2016-05-21 LAB — CBC AND DIFFERENTIAL
Baso # K/uL: 0.1 10*3/uL (ref 0.0–0.1)
Basophil %: 0.7 %
Eos # K/uL: 0.2 10*3/uL (ref 0.0–0.4)
Eosinophil %: 1.3 %
Hematocrit: 41 % (ref 34–45)
Hemoglobin: 12.7 g/dL (ref 11.2–15.7)
IMM Granulocytes #: 0.1 10*3/uL (ref 0.0–0.1)
IMM Granulocytes: 0.7 %
Lymph # K/uL: 3.3 10*3/uL (ref 1.2–3.7)
Lymphocyte %: 24.8 %
MCH: 27 pg/cell (ref 26–32)
MCHC: 31 g/dL — ABNORMAL LOW (ref 32–36)
MCV: 86 fL (ref 79–95)
Mono # K/uL: 0.6 10*3/uL (ref 0.2–0.9)
Monocyte %: 4.8 %
Neut # K/uL: 9 10*3/uL — ABNORMAL HIGH (ref 1.6–6.1)
Nucl RBC # K/uL: 0 10*3/uL (ref 0.0–0.0)
Nucl RBC %: 0 /100 WBC (ref 0.0–0.2)
Platelets: 352 10*3/uL (ref 160–370)
RBC: 4.8 MIL/uL (ref 3.9–5.2)
RDW: 14.8 % — ABNORMAL HIGH (ref 11.7–14.4)
Seg Neut %: 67.7 %
WBC: 13.4 10*3/uL — ABNORMAL HIGH (ref 4.0–10.0)

## 2016-05-22 ENCOUNTER — Encounter: Payer: Self-pay | Admitting: Primary Care

## 2016-05-22 DIAGNOSIS — IMO0002 Reserved for concepts with insufficient information to code with codable children: Secondary | ICD-10-CM

## 2016-05-22 LAB — HEMOGLOBIN A1C: Hemoglobin A1C: 7.4 % — ABNORMAL HIGH (ref 4.0–6.0)

## 2016-05-23 ENCOUNTER — Telehealth: Payer: Self-pay

## 2016-05-23 ENCOUNTER — Encounter: Payer: Self-pay | Admitting: Registered Nurse

## 2016-05-23 MED ORDER — DULAGLUTIDE 1.5 MG/0.5ML SC SOAJ *I*
1.5000 mg | SUBCUTANEOUS | 5 refills | Status: DC
Start: 2016-05-23 — End: 2016-08-05

## 2016-05-23 NOTE — Telephone Encounter (Signed)
Pt cancelled appt for 2/1 with Tresa Res due to conflict with another appt. She rescheduled appt 2/7 with Tresa Res

## 2016-05-31 ENCOUNTER — Other Ambulatory Visit: Payer: Self-pay | Admitting: Primary Care

## 2016-05-31 DIAGNOSIS — R109 Unspecified abdominal pain: Secondary | ICD-10-CM

## 2016-05-31 DIAGNOSIS — R197 Diarrhea, unspecified: Secondary | ICD-10-CM

## 2016-06-03 NOTE — Telephone Encounter (Signed)
Patient Name: Kaylee Harris Birth Date: [birth year]   Address: [place], [place] [address] Sex: Female   Rx Written Rx Dispensed Drug Quantity Days Supply Prescriber Name Payment Method Dispenser   01/15/2016 01/15/2016 tramadol hcl 50 mg tablet  8 2 [place], [place]. #02   Others' Prescriptions  Patient Name: [name] Birth Date: 10-26-1975   Address: [address] [place], [place] [address] Sex: Female   Rx Written Rx Dispensed Drug Quantity Days Supply Prescriber Name Payment Method Dispenser   02/29/2016 03/03/2016 tramadol hcl 50 mg tablet  42 14 [name], [place]arnell Level (MD) [place]. #02   01/19/2016 01/19/2016 tramadol hcl 50 mg tablet  68 14 [name], Darnell Level (MD) [place]. #02   12/05/2015 12/05/2015 diphenoxylate-atropine 2.5-0.025 mg tablet  56 14 [place], [place]. #02   10/20/2015 10/20/2015 tramadol hcl 50 mg tablet  5 2 [place], [place]. #02   * - Drugs marked with an asterisk are compound drugs. If the compound drug is made up of more than one controlled substance, then each controlled substance will be a separate row in the table

## 2016-06-04 MED ORDER — TRAMADOL HCL 50 MG PO TABS *I*
50.0000 mg | ORAL_TABLET | Freq: Three times a day (TID) | ORAL | 0 refills | Status: DC | PRN
Start: 2016-06-04 — End: 2016-08-23

## 2016-06-06 ENCOUNTER — Ambulatory Visit: Payer: Self-pay

## 2016-06-10 ENCOUNTER — Other Ambulatory Visit
Admission: RE | Admit: 2016-06-10 | Discharge: 2016-06-10 | Disposition: A | Discharge status: Home / Self Care | Payer: Self-pay | Source: Ambulatory Visit | Attending: Gastroenterology | Admitting: Gastroenterology

## 2016-06-10 ENCOUNTER — Encounter: Payer: Self-pay | Admitting: Psychiatry

## 2016-06-10 ENCOUNTER — Ambulatory Visit: Payer: Self-pay | Admitting: Psychiatry

## 2016-06-10 VITALS — BP 121/75 | HR 71 | Resp 18 | Ht 67.99 in | Wt 285.0 lb

## 2016-06-10 DIAGNOSIS — F422 Mixed obsessional thoughts and acts: Secondary | ICD-10-CM

## 2016-06-10 DIAGNOSIS — F3341 Major depressive disorder, recurrent, in partial remission: Secondary | ICD-10-CM

## 2016-06-10 DIAGNOSIS — F4312 Post-traumatic stress disorder, chronic: Secondary | ICD-10-CM

## 2016-06-10 LAB — CBC AND DIFFERENTIAL
Baso # K/uL: 0.1 10*3/uL (ref 0.0–0.1)
Basophil %: 0.7 %
Eos # K/uL: 0.2 10*3/uL (ref 0.0–0.4)
Eosinophil %: 1.5 %
Hematocrit: 40 % (ref 34–45)
Hemoglobin: 12.4 g/dL (ref 11.2–15.7)
IMM Granulocytes #: 0.1 10*3/uL (ref 0.0–0.1)
IMM Granulocytes: 0.5 %
Lymph # K/uL: 2.9 10*3/uL (ref 1.2–3.7)
Lymphocyte %: 27 %
MCH: 27 pg/cell (ref 26–32)
MCHC: 31 g/dL — ABNORMAL LOW (ref 32–36)
MCV: 86 fL (ref 79–95)
Mono # K/uL: 0.6 10*3/uL (ref 0.2–0.9)
Monocyte %: 5.9 %
Neut # K/uL: 7 10*3/uL — ABNORMAL HIGH (ref 1.6–6.1)
Nucl RBC # K/uL: 0 10*3/uL (ref 0.0–0.0)
Nucl RBC %: 0 /100 WBC (ref 0.0–0.2)
Platelets: 395 10*3/uL — ABNORMAL HIGH (ref 160–370)
RBC: 4.7 MIL/uL (ref 3.9–5.2)
RDW: 14.6 % — ABNORMAL HIGH (ref 11.7–14.4)
Seg Neut %: 64.4 %
WBC: 10.9 10*3/uL — ABNORMAL HIGH (ref 4.0–10.0)

## 2016-06-10 LAB — HEPATIC FUNCTION PANEL
ALT: 29 U/L (ref 0–35)
AST: 17 U/L (ref 0–35)
Albumin: 4.4 g/dL (ref 3.5–5.2)
Alk Phos: 114 U/L — ABNORMAL HIGH (ref 35–105)
Bilirubin,Direct: <0.2 mg/dL (ref 0.0–0.3)
Bilirubin,Total: 0.3 mg/dL (ref 0.0–1.2)
Total Protein: 6.8 g/dL (ref 6.3–7.7)

## 2016-06-10 LAB — HIGH SENSITIVITY CRP: CRP,High Sensitivity: 3.3 mg/L — AB

## 2016-06-10 LAB — BASIC METABOLIC PANEL
Anion Gap: 16 (ref 7–16)
CO2: 21 mmol/L (ref 20–28)
Calcium: 9.5 mg/dL (ref 8.8–10.2)
Chloride: 104 mmol/L (ref 96–108)
Creatinine: 0.9 mg/dL (ref 0.51–0.95)
GFR,Black: 91 *
GFR,Caucasian: 79 *
Glucose: 125 mg/dL — ABNORMAL HIGH (ref 60–99)
Lab: 10 mg/dL (ref 6–20)
Potassium: 4.8 mmol/L (ref 3.3–5.1)
Sodium: 141 mmol/L (ref 133–145)

## 2016-06-10 NOTE — Progress Notes (Signed)
Behavioral Health Psychopharmacology Follow-up     Length of Session: 20 minutes.    Diagnosis Addressed    ICD-10-CM ICD-9-CM   1. 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 Mixed obsessional thoughts and acts F42.2 300.3   3. Chronic post-traumatic stress disorder (PTSD) F43.12 309.81       Recent History and Response to Medications  Patient states: She feels better being off the Abilify.  "I don't know how it works but I'm off of it and I feel happier." She says he has started doing more at home like cooking meals.  Her wife tells her she seems happier.  She still has some flashbacks and anxiety if she sees her father in law.  She says she still has the OCD symptoms where she is picking at her nails and feels the need to straighten things. She says she spends 3-4 hours per day doing compulsions.  Patient says she is more willing now to work on her OCD.    HPI     Follow-up    Additional comments: patient is here to follow up on her medications       Last edited by Shirlee More, LPN on 05/11/1094  0:45 PM. (History)          Current use of alcohol or drugs: No        Neurovegetative Symptoms Review:  Energy level: fair  Concentration: fair  Sleep Quality: fair   Number of hours :  (6)  Appetite: fair     Wt Readings from Last 3 Encounters:   06/10/16 129.3 kg (285 lb)   05/13/16 132 kg (291 lb)   05/13/16 131.1 kg (289 lb)     Enjoyment/interest: fair    Current Medications  Current Outpatient Prescriptions   Medication Sig    metoprolol (TOPROL-XL) 25 MG 24 hr tablet     traMADol (ULTRAM) 50 MG tablet Take 1 tablet (50 mg total) by mouth every 8 hours as needed (for head pain and abd pain)   Max daily dose: 150 mg    dulaglutide (TRULICITY) 1.5 WU/9.8JX injection pen Inject 0.5 mLs (1.5 mg total) into the skin every 7 days    pantoprazole (PROTONIX) 40 MG EC tablet Take 1 tablet (40 mg total) by mouth daily   SWALLOW WHOLE. DO NOT CRUSH, BREAK, OR CHEW.    topiramate (TOPAMAX)  100 MG tablet Take 1 tablet (100 mg total) by mouth 2 times daily    fluticasone (FLONASE) 50 MCG/ACT nasal spray 1 spray by Nasal route daily    atorvastatin (LIPITOR) 40 MG tablet TAKE 1 TABLET BY MOUTH EVERY DAY WITH DINNER    lamoTRIgine (LAMICTAL) 150 MG tablet TAKE 1 TABLET (150 MG TOTAL) BY MOUTH DAILY    insulin glargine 300 UNIT/ML SOPN Inject 30 Units into the skin nightly   FREE voucher will be used; DO NOT bill Insurance    busPIRone (BUSPAR) 30 MG tablet Take 1 tablet (30 mg total) by mouth 2 times daily    mirtazapine (REMERON) 15 MG tablet Take 1 tablet (15 mg total) by mouth nightly    dicyclomine (BENTYL) 20 MG tablet TAKE 1 TABLET (20 MG TOTAL) BY MOUTH 4 TIMES DAILY (BEFORE MEALS AND NIGHTLY)    ascorbic acid (VITAMIN C) 100 MG tablet Take 100 mg by mouth daily    midodrine (PROAMATINE) 10 MG tablet Take 1 tablet (10 mg total) by mouth 3 times daily    insulin syringe-needle  U-100 (BD ULTRAFINE) 31G X 5/16" 0.3 ML HALF-UNIT Use 3 times a day as instructed.    diphenoxylate-atropine (LOMOTIL) 2.5-0.025 MG per tablet Take 1 tablet by mouth 4 times daily as needed for Diarrhea   Max daily dose: 4 tablets    Insulin Disposable Pump (V-GO 30) KIT Use daily; 30ct/month.    insulin lispro (HUMALOG) 100 UNIT/ML injection vial For VGO: Dinner 16X (6 clicks) ; B & L 4- 6 units. BG 100 - 150 8u (4clk), 151 - 200 10U (5clk), >201 12U ( 6 ck). MDD 72U (36 clk)    DULoxetine (CYMBALTA) 60 MG capsule Take 1 capsule (60 mg total) by mouth daily    FREESTYLE LITE test strip Use four times daily as directed for 250.02    blood glucose monitor system Brand: cheapest brand available per her insurance.  Use as directed.    lancets Brand Free Style Lite; Use 2 times per day as directed for blood glucose testing.    Alcohol Swabs (ALCOHOL WIPES) PADS Use BID for BG check    cetirizine (ZYRTEC) 10 MG tablet Take 10 mg by mouth daily    levonorgestrel (MIRENA) 20 MCG/24HR IUD 1 each by Intrauterine  route once    albuterol HFA 108 (90 BASE) MCG/ACT inhaler Inhale 1-2 puffs into the lungs every 6 hours as needed for Wheezing   Shake well before each use.    promethazine (PHENERGAN) 12.5 MG tablet Take 1 tablet (12.5 mg total) by mouth 4 times daily as needed    SUMAtriptan (IMITREX) 50 MG tablet Take 1 tablet (50 mg total) by mouth as needed for Migraine   Take at onset of headache. May repeat once in 2 hours.    clonazePAM (KLONOPIN) 0.5 MG tablet Take 1 tablet (0.5 mg total) by mouth 2 times daily as needed   Max daily dose: 1 mg    famciclovir (FAMVIR) 500 MG tablet Take 1 tablet (500 mg total) by mouth 3 times daily as needed    Non-System Medication The above patient is followed in our clinic and cannot resume work permanently.     No current facility-administered medications for this visit.      Side Effects  Patient Reported Side Effects: None reported    Mental Status  APPEARANCE: Casual  ATTITUDE TOWARD INTERVIEWER: Cooperative  MOTOR ACTIVITY: WNL (within normal limits)  EYE CONTACT: Direct  SPEECH: Normal rate and tone  AFFECT: Full Range and Appropriate  MOOD: Euphoric  THOUGHT PROCESS: Normal  THOUGHT CONTENT: No unusual themes  PERCEPTION: Within normal limits  ORIENTATION: Alert and Oriented X 3.  CONCENTRATION: Good  MEMORY:   Recent: intact   Remote: intact  COGNITIVE FUNCTION: Average intelligence  JUDGMENT: Intact  IMPULSE CONTROL: Fair  Except eating junk food is poor.  INSIGHT: Good and Fair    Risk Assessment    Self Injury: Patient Denies  Suicidal Ideation: Patient Denies  Homicidal Ideation: Patient Denies  Aggressive Behavior: Patient Denies    Results    Results for KARMIN, KASPRZAK (MRN [id number]) as of 06/10/2016 14:35   Ref. Range 05/21/2016 14:27   Hemoglobin A1C Latest Ref Range: 4.0 - 6.0 % 7.4 (H)   WBC Latest Ref Range: 4.0 - 10.0 THOU/uL 13.4 (H)   RBC Latest Ref Range: 3.9 - 5.2 MIL/uL 4.8   Hemoglobin Latest Ref Range: 11.2 - 15.7 g/dL 12.7   Hematocrit Latest Ref Range: 34 -  45 % 41   MCV Latest Ref Range:  79 - 95 fL 86   MCH Latest Ref Range: 26 - 32 pg/cell 27   MCHC Latest Ref Range: 32 - 36 g/dL 31 (L)   RDW Latest Ref Range: 11.7 - 14.4 % 14.8 (H)   Platelets Latest Ref Range: 160 - 370 THOU/uL 352   Neut # K/uL Latest Ref Range: 1.6 - 6.1 THOU/uL 9.0 (H)   Lymph # K/uL Latest Ref Range: 1.2 - 3.7 THOU/uL 3.3   Mono # K/uL Latest Ref Range: 0.2 - 0.9 THOU/uL 0.6   Eos # K/uL Latest Ref Range: 0.0 - 0.4 THOU/uL 0.2   Baso # K/uL Latest Ref Range: 0.0 - 0.1 THOU/uL 0.1   IMM Granulocytes # Latest Ref Range: 0.0 - 0.1 THOU/uL 0.1   Nucl RBC # K/uL Latest Ref Range: 0.0 - 0.0 THOU/uL 0.0   Seg Neut % Latest Units: % 67.7   Lymphocyte % Latest Units: % 24.8   Monocyte % Latest Units: % 4.8   Eosinophil % Latest Units: % 1.3   Basophil % Latest Units: % 0.7   IMM Granulocytes Latest Units: % 0.7   Nucl RBC % Latest Ref Range: 0.0 - 0.2 /100 WBC 0.0     BP Readings from Last 3 Encounters:   06/10/16 121/75   05/13/16 120/84   05/13/16 126/59       Assessment  FORMULATION: Patient's mood is better since she is off the Abilify.  She believes the Lamictal and Buspar both help as when we tried to cut those back in the past, "It was horrible."  The mirtazapine whlch with her sleep and the Cymbalta helps with her pain.  She has not needed the prn of Klonopin recently.  We discussed not making any additional changes in her medications to which she agreed.  Writer encouraged her to work on the [place] in therapy.  We also discussed that the Lamictal can make her a little more prone to infection but her neutrophil count is good and was elevated as above but she still is having trouble with diarrhea.  Hopefully since she is off the Abilify, her blood glucose levels will also decrease.  Patient says she has asked her wife not to bring junk food into the house which Probation officer supported.     Recommendations/Plan and Rationale  PLAN: Continue current medications.  Return in 12 weeks.

## 2016-06-12 ENCOUNTER — Other Ambulatory Visit
Admission: RE | Admit: 2016-06-12 | Discharge: 2016-06-12 | Disposition: A | Discharge status: Home / Self Care | Payer: Self-pay | Source: Ambulatory Visit | Attending: Gastroenterology | Admitting: Gastroenterology

## 2016-06-12 ENCOUNTER — Ambulatory Visit: Payer: Self-pay

## 2016-06-12 DIAGNOSIS — F422 Mixed obsessional thoughts and acts: Secondary | ICD-10-CM

## 2016-06-12 DIAGNOSIS — F3341 Major depressive disorder, recurrent, in partial remission: Secondary | ICD-10-CM

## 2016-06-12 LAB — CLOSTRIDIUM DIFFICILE EIA: C difficile Toxins A and B EIA: 0

## 2016-06-12 NOTE — Progress Notes (Signed)
Behavioral Health Progress Note     LENGTH OF SESSION: 45 minutes    Contact Type:  Location: On Site    Face to Face     Problem(s)/Goals Addressed from Treatment Plan:    Problem 1:   Treatment Problem #1 05/15/2016   Patient Identified Problem Depression and anxiety       Goal for this problem:    Treatment Goal #1 05/15/2016   Patient Identified Goal Learn DBT and PST skills to manage affect       Progress towards this goal: Problem resolving. Comment: Patient reports a stable mood.    Mental Status Exam:  APPEARANCE: Appears stated age, Well-groomed, Casual  ATTITUDE TOWARD INTERVIEWER: Cooperative  MOTOR ACTIVITY: WNL (within normal limits)  EYE CONTACT: Direct  SPEECH: Normal rate and tone  AFFECT: Full Range  MOOD: Depressed, Lively and Neutral  THOUGHT PROCESS: Normal and Circumstantial  THOUGHT CONTENT: Negative Rumination  PERCEPTION: No evidence of hallucinations  CURRENT SUICIDAL IDEATION: patient denies  CURRENT HOMICIDAL IDEATION: Patient denies  ORIENTATION: Alert and Oriented X 3.  CONCENTRATION: Good  MEMORY:   Recent: intact   Remote: intact  COGNITIVE FUNCTION: Average intelligence  JUDGMENT: Intact  IMPULSE CONTROL: Fair  INSIGHT: Fair    Risk Assessment:  ASSESSMENT OF RISK FOR SUICIDAL BEHAVIOR  Changes in risk for suicide from baseline Formulation of Risk and/or previous intake, including newly identified risk, if any: none  Violence risk was assessed and No Change noted from baseline formulation of risk and/or previous assessment.    Session Content::  Patient reports she has a care manager coming to the house. Patient talked about issues within her relationship related to OCD. Writer talked to patient about working on skills to reduce her distress and manage behaviors. Writer gave patient worksheets and homework of making a record of rituals she does. Patient told Probation officer that she has anxiety about her wife overspending. She said her wife had to give up her credit cards and do debt  consolidation due to overspending and she is worried about her doing the same with her credit. Writer and patient discussed effective ways in which to address this with her wife. Patient told writer about trying to maintain a good relationship with her SIL. Writer commended patient on her success to date towards this goal.     Visit Diagnosis:      ICD-10-CM ICD-9-CM   1. MDD (major depressive disorder), recurrent, in partial remission F33.41 296.35       Interventions:  Cognitive Behavioral Therapy skills (specifiy skills used):  record of rituals  Provided Psychoeducation  Supportive Psychotherapy  Solution Focused therapy    Current Treatment Plan   Created/Updated On 05/15/2016   Next Treatment Plan Due 08/12/2016     Plan:  Psychotherapy continues as described in care plan; plan remains the same.    NEXT APPT: 06/26/16 @2     Tresa Res, LCSW

## 2016-06-13 LAB — CAMPYLOBACTER PCR: Campylobacter PCR: 0

## 2016-06-13 LAB — ENTAMOEBA HISTOLYTICA PCR: E. histolytica PCR: 0

## 2016-06-13 LAB — CRYPTOSPORIDIUM PCR: Cryptosporidium PCR: 0

## 2016-06-13 LAB — SHIGA TOXIN PCR: Shiga toxin PCR: 0

## 2016-06-13 LAB — SHIGELLA PCR: Shigella PCR: 0

## 2016-06-13 LAB — GIARDIA PCR: Giardia PCR: 0

## 2016-06-13 LAB — SALMONELLA PCR: Salmonella PCR: 0

## 2016-06-17 ENCOUNTER — Other Ambulatory Visit: Payer: Self-pay | Admitting: Psychiatry

## 2016-06-18 ENCOUNTER — Other Ambulatory Visit: Payer: Self-pay | Admitting: Psychiatry

## 2016-06-18 MED ORDER — BUSPIRONE HCL 30 MG PO TABS *I*
30.0000 mg | ORAL_TABLET | Freq: Two times a day (BID) | ORAL | 0 refills | Status: DC
Start: 2016-06-18 — End: 2016-07-17

## 2016-06-18 MED ORDER — DULOXETINE HCL 60 MG PO CPEP *I*
60.0000 mg | DELAYED_RELEASE_CAPSULE | Freq: Every day | ORAL | 0 refills | Status: DC
Start: 2016-06-18 — End: 2016-08-19

## 2016-06-19 ENCOUNTER — Encounter: Payer: Self-pay | Admitting: Registered Nurse

## 2016-06-19 ENCOUNTER — Ambulatory Visit: Payer: Medicare (Managed Care) | Attending: Primary Care | Admitting: Registered Nurse

## 2016-06-19 DIAGNOSIS — E1165 Type 2 diabetes mellitus with hyperglycemia: Secondary | ICD-10-CM | POA: Insufficient documentation

## 2016-06-19 DIAGNOSIS — IMO0002 Reserved for concepts with insufficient information to code with codable children: Secondary | ICD-10-CM

## 2016-06-19 NOTE — Patient Instructions (Signed)
1.  Continue to Humalog "clicks" before each meal,  B 4; L 4; dinner 6   Snack 1 click (2units)    2.  Too low below 90,  3 to 4 glucose tabs; wait 15 min;  If over 90,  Ok; eat snack (1/2 PB sandwich)   If still <90,  Repeat glucose tabs    3.  Reschedule eye MD appt    4.  If in need of VGO,  Send Gay Filler message

## 2016-06-19 NOTE — Progress Notes (Signed)
Leata Mouse 41 y.o. female was seen today for diabetes education regarding  uncontrolled type 2 diabetes mellitus. Last seen 04/25/16 for DSMT & continue VGO insulin system + Trulicity injection teaching.  individual visits; new to Medicare as of 07/17/15 & initial year of DSMT.  Costs continue as barrier to self mgt     Diabetes Meds: reviewed in eRecord; patient is taking as prescribed. Trulicity 1.5 mg q week;Humalog vial in VGO  30 insulin delivery system (1 click = 2 units); recall B 4 & L 4-5 clicks; dinner 6 clicks.  TDD 62 units    Lab Results   Component Value Date    HA1C 7.4 (H) 05/21/2016    HA1C 7.3 (H) 12/27/2015    HA1C 7.4 (H) 09/22/2015    MALBR 0.31 09/11/2015    CREAT 0.90 06/10/2016    LDLC 60 12/27/2015     Self glucose monitoring:  [name]  BG meter 3 times daily. fbs 149; AC meals 83-142 hs 156     BG today:  153  mg/dl 1  PP meal & took 4 clicks (8 units)       Diet: 3 meals per day wt loss  Maintained 283 lbs from 300 lbs ; less hungry  Current Activity  using walker  Smoking: no Alcohol: no rare     Self foot check:  self care  Eye Exam inquiry: due     Assessment:   Describes proper skill with filling of VGO device & skin placement on abdomen  Currently using VGO30  Partner present & both demonstrate proper understanding of basal / bolus system  2 units = 1 click  Will cont  4 - 6 clicks (AC B & L);  Largest meal dinner, 6 clicks  Instructed correction dosing if bG 150 or >; 1 to 30 correction factor  Will bolus for snacks; 1 to 7 gms   Due to mental health, feels cannot participate in group DM education & benefits individual       Coun/Edu:     BARRIERS THAT AFFECT LEARNING:  Mental health ; followed by psych  READINESS TO LEARN:  good  PREFERRED METHODS OF LEARNING:  reading, demonstration and visual tools  EDUCATION PROVIDED: insulin teaching, BG Goals, using ICR and CF to determine meal bolus and future CGM; risk reduction; skin care with VGO    Plan:     Patient Instructions   1.   Continue to Humalog "clicks" before each meal,  B 4; L 4; dinner 6   Snack 1 click (2units)    2.  Too low below 90,  3 to 4 glucose tabs; wait 15 min;  If over 90,  Ok; eat snack (1/2 PB sandwich)   If still <90,  Repeat glucose tabs    3.  Reschedule eye MD appt    4.  If in need of VGO,  Send Gay Filler message     DSMT SUPPORT PLAN:  Take advantage of your annual Medicare benefits for DSMT and MNT  Follow-up visit:  09/2016 at [facility] location  Time spent counseling patient: 30 minutes    Medicare initial MD order in eRecord 07/17/15. Expires 07/16/16  Initial year DSMT for Medicare  DSMT  individual 1/2 hr;  7  individual hrs remaining    Tremel Setters C Hallis Meditz, RN CDE

## 2016-06-21 ENCOUNTER — Other Ambulatory Visit: Payer: Self-pay | Admitting: Gastroenterology

## 2016-06-21 NOTE — Progress Notes (Signed)
[facility]   Office Note    Primary Care Physician:   [name], MD   Patient Name: Kaylee Harris  06/21/2016      Subjective: 41 y.o. female was seen in our office today for follow up of chronic abdominal pain and post prandial diarrhea s/p right hemicolectomy 2015. Colonoscopy 02/2016 unremarkable other than hemorrhoids, labs 06/2016 for ongoing symptoms and reported BRBPR noted mild CRP elevation of 3.3 (normal <3) and WBC 10.9K.     Patient has been unsuccessful on fiber therapy, Bentyl 10m QID, Questran and empiriic Augmentin course. She uses 1-2 Lomotil prn, stools bilious and pp. Weight stable, reflux stable on pantoprazole. Continues with chronic Right sided pain, though now reporting left mid abdominal pain as well, afebrile. 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 Lower abdomen with cellulitis changes, wound probed and purulent material expressed.  Had PICC line for "multiple infiltrations" of what? D/c-ed home on 10d of Augmentin 9/11.      Anginal pain     Anxiety     Arthritis     Asthma     Complication of anesthesia     Depression     Diabetes mellitus     Previously on SU and metformin, now diet controlled    Diverticulitis 09/2010    Dysfunctional uterine bleeding     Fibromyalgia     GERD (gastroesophageal reflux disease)     Hyperlipidemia     Migraine     Neuromuscular disorder     POTS (postural orthostatic tachycardia syndrome)     Sebaceous cyst of breast     right axilla    Varicella     Past Surgical History:   Procedure Laterality Date    APPENDECTOMY  2016    arthroscopic shoulder surgery Right 2010    Related to lifting    CARDIAC CATHETERIZATION  09/2011    negative    DILATION AND CURETTAGE OF UTERUS  2005, 2007    x2 for menorraghia    LEFT COLECTOMY  01/03/14    Dr BTresa Res   loop recorder  Feb 03 2014    PR  COLONOSCOPY THRU COLOTOMY N/A 02/16/2016    Procedure: COLONOSCOPY;  Surgeon: GKelly Splinter MD;  Location: [facility]  Service: GI    SMALL INTESTINE SURGERY      TONSILLECTOMY         Social History:   Social History   Substance Use Topics    Smoking status: Former Smoker     Packs/day: 0.50     Years: 3.00     Types: Cigarettes    Smokeless tobacco: Never Used    Alcohol use 0.0 oz/week     0 Standard drinks or equivalent per week      Comment: <1 weekly       Family History:   Family History   Problem Relation Age of Onset    Hypertension Father     Diabetes Father     Kidney Disease Father     Elevated lipids Father     Heart attack Father 380   Other Father      PVD    Hypertension Mother     Elevated lipids Mother     Heart attack Mother 63  Diabetes Mother     Eczema Mother     Psoriasis Mother     Hypertension Brother     Heart Disease Brother 42     prinzmetal's angina    Heart Disease Sister      currently having work up    [place] Sister     Hypertension Sister     Breast cancer Maternal Grandmother     Stroke Other         Medications:   Prior to Admission medications    Medication Sig Start Date End Date Taking? Authorizing Provider   DULoxetine (CYMBALTA) 60 MG capsule Take 1 capsule (60 mg total) by mouth daily 06/18/16   Lucia Estelle, NP   busPIRone (BUSPAR) 30 MG tablet Take 1 tablet (30 mg total) by mouth 2 times daily 06/18/16   Lucia Estelle, NP   metoprolol (TOPROL-XL) 25 MG 24 hr tablet  05/24/16   [provider]   traMADol (ULTRAM) 50 MG tablet Take 1 tablet (50 mg total) by mouth every 8 hours as needed (for head pain and abd pain)   Max daily dose: 150 mg 06/04/16   [name], MD   dulaglutide (TRULICITY) 1.5 UU/7.2ZD injection pen Inject 0.5 mLs (1.5 mg total) into the skin every 7 days 05/23/16   [name], MD   pantoprazole (PROTONIX) 40 MG EC tablet Take 1 tablet (40 mg total) by mouth daily   SWALLOW WHOLE. DO NOT  CRUSH, [place], OR CHEW. 05/17/16   [name], MD   topiramate (TOPAMAX) 100 MG tablet Take 1 tablet (100 mg total) by mouth 2 times daily 05/16/16   [name], MD   albuterol HFA 108 (90 BASE) MCG/ACT inhaler Inhale 1-2 puffs into the lungs every 6 hours as needed for Wheezing   Shake well before each use. 05/15/16   [name], MD   fluticasone Asencion Islam) 50 MCG/ACT nasal spray 1 spray by Nasal route daily 05/15/16   [name], MD   atorvastatin (LIPITOR) 40 MG tablet TAKE 1 TABLET BY MOUTH EVERY DAY WITH DINNER 05/08/16   [name], MD   lamoTRIgine (LAMICTAL) 150 MG tablet TAKE 1 TABLET (150 MG TOTAL) BY MOUTH DAILY 04/30/16   Corigliano, Suann Larry, NP   insulin glargine 300 UNIT/ML SOPN Inject 30 Units into the skin nightly   FREE voucher will be used; DO NOT bill Insurance 04/25/16   [name], MD   mirtazapine (REMERON) 15 MG tablet Take 1 tablet (15 mg total) by mouth nightly 04/15/16   Corigliano, Suann Larry, NP   dicyclomine (BENTYL) 20 MG tablet TAKE 1 TABLET (20 MG TOTAL) BY MOUTH 4 TIMES DAILY (BEFORE MEALS AND NIGHTLY) 04/01/16   [name], MD   ascorbic acid (VITAMIN C) 100 MG tablet Take 100 mg by mouth daily    [provider]   midodrine (PROAMATINE) 10 MG tablet Take 1 tablet (10 mg total) by mouth 3 times daily 01/12/16   Nelta Numbers, MD   insulin syringe-needle U-100 (BD ULTRAFINE) 31G X 5/16" 0.3 ML HALF-UNIT Use 3 times a day as instructed. 12/28/15   Nelta Numbers, MD   diphenoxylate-atropine (LOMOTIL) 2.5-0.025 MG per tablet Take 1 tablet by mouth 4 times daily as needed for Diarrhea   Max daily dose: 4 tablets 12/05/15   Gift, Sherlon Handing, DO   promethazine (PHENERGAN) 12.5 MG tablet Take 1 tablet (12.5 mg total) by mouth 4 times daily as needed 12/04/15  [name], MD   SUMAtriptan (IMITREX) 50 MG tablet Take 1 tablet (50 mg total) by mouth as needed for Migraine   Take at onset of headache. May repeat once in 2 hours. 11/08/15   [name], MD    Insulin Disposable Pump (V-GO 30) KIT Use daily; 30ct/month. 10/11/15   [name], MD   insulin lispro (HUMALOG) 100 UNIT/ML injection vial For VGO: Dinner 91Y (6 clicks) ; B & L 4- 6 units. BG 100 - 150 8u (4clk), 151 - 200 10U (5clk), >201 12U ( 6 ck). MDD 72U (36 clk) 09/13/15   [name], MD   clonazePAM (KLONOPIN) 0.5 MG tablet Take 1 tablet (0.5 mg total) by mouth 2 times daily as needed   Max daily dose: 1 mg 11/30/14   Corigliano, Suann Larry, NP   FREESTYLE LITE test strip Use four times daily as directed for 250.02 11/21/14   [name], MD   blood glucose monitor system Brand: cheapest brand available per her insurance.  Use as directed. 11/15/14   Woodward Ku, NP   lancets Brand Free Style Lite; Use 2 times per day as directed for blood glucose testing. 11/15/14   Woodward Ku, NP   Alcohol Swabs (ALCOHOL WIPES) PADS Use BID for BG check 10/31/14   [name], MD   famciclovir [facility]) 500 MG tablet Take 1 tablet (500 mg total) by mouth 3 times daily as needed 10/11/14   [name], MD   cetirizine (ZYRTEC) 10 MG tablet Take 10 mg by mouth daily    [provider]   levonorgestrel (MIRENA) 20 MCG/24HR IUD 1 each by Intrauterine route once    [provider]   Non-System Medication The above patient is followed in our clinic and cannot resume work permanently. 02/15/13   Lance Bosch, NP       Allergies:   Allergies   Allergen Reactions    Morphine Anaphylaxis    Toradol [Ketorolac Tromethamine] Anaphylaxis     Notes feeling tightness in her throat and full body itching after last dose a month ago. Also notes erythema tracking along her vein after injection. This is not reflected by documentation.  Tolerates Naproxen.    Trazodone Anaphylaxis    Morphine Other (See Comments)     unknown    Toradol [Ketorolac Tromethamine] Other (See Comments)     unknown    Trazodone Other (See Comments)     unknown       Objective:  Vital Signs: wt 286lb, pulse 78, BP  128/82    General appearance: alert, appears stated age and cooperative  Head: Normocephalic, without obvious abnormality, atraumatic  Eyes: conjunctivae/corneas clear, anicteric  Neck: supple, symmetrical, trachea midline  Lungs: clear to auscultation bilaterally  Heart: regular rate and rhythm, S1, S2 normal, no murmur, click, rub or gallop  Abdomen: soft, obese with reported tenderness left mid abdomen and right upper quadrant  Extremities: extremities normal, atraumatic, no cyanosis or edema  Rectal: Deferred at this time  Neurologic: Grossly normal  MSK: Grossly normal  Lymph: No appreciable lymphadenopathy    Lab Results: Lab Results   Component Value Date    WBC 10.9 (H) 06/10/2016    HGB 12.4 06/10/2016    HCT 40 06/10/2016    MCV 86 06/10/2016    PLT 395 (H) 06/10/2016          Lab results: 06/10/16  1600   Sodium 141   Potassium 4.8  Chloride 104   CO2 21   UN 10   Creatinine 0.90   GFR,Caucasian 79   GFR,Black 91   Glucose 125*   Calcium 9.5        Lab Results   Component Value Date    ALT 29 06/10/2016    AST 17 06/10/2016     Lab Results   Component Value Date    TB 0.3 06/10/2016      Lab Results   Component Value Date    ALK 114 (H) 06/10/2016      Lab Results   Component Value Date    LIP 39 01/04/2016    AMY 46 01/04/2016      No results for input(s): PTI, INR in the last 8760 hours.      Imaging:   No results found.        Assessment/Plan: 41 y.o. female presents for follow up of abdominal pain and post prandial diarrhea not improved on multiple therapies with colonoscopy 02/2016 including random bx unremarkable. Last CT 2015 after "falling on a knife" due to repeat syncope.     I suspect largely IBS vs. Functional with component of adhesions, however given new location of pain will complete CTE to eval for occult SB inflammation to suggest IBD, etc.     1. CTE enterography  2. Continue Dicyclomine and Lomotil 1-2 tablets ever6 hours PRN, MDD 8 tablets. Script sent  3. Avoid fatty or greasy  foods  4. FU in 6 weeks with JIG.         Thank you for allowing me to participate in this patient's care.  Please do not hesistate to contact us with any questions or concerns.    Office #: [phone] or Office Fax: [phone]    Electronically Signed by:  Mendel Corning, PA  Note created: 06/21/2016  at: 10:38 AM

## 2016-06-24 ENCOUNTER — Other Ambulatory Visit: Payer: Self-pay | Admitting: Gastroenterology

## 2016-06-26 ENCOUNTER — Ambulatory Visit: Payer: Self-pay

## 2016-06-26 DIAGNOSIS — F3341 Major depressive disorder, recurrent, in partial remission: Secondary | ICD-10-CM

## 2016-06-26 DIAGNOSIS — F422 Mixed obsessional thoughts and acts: Secondary | ICD-10-CM

## 2016-06-26 NOTE — Progress Notes (Signed)
Behavioral Health Progress Note     LENGTH OF SESSION: 45 minutes    Contact Type:  Location: On Site    Face to Face     Problem(s)/Goals Addressed from Treatment Plan:    Problem 1:   Treatment Problem #1 05/15/2016   Patient Identified Problem Depression and anxiety       Goal for this problem:    Treatment Goal #1 05/15/2016   Patient Identified Goal Learn DBT and PST skills to manage affect       Progress towards this goal: Problem resolving. Comment: Patient reports working on OCD.    Mental Status Exam:  APPEARANCE: Appears stated age, Well-groomed, Casual  ATTITUDE TOWARD INTERVIEWER: Cooperative  MOTOR ACTIVITY: WNL (within normal limits)  EYE CONTACT: Direct and Indirect  SPEECH: Normal rate and tone  AFFECT: Full Range  MOOD: Anxious and Depressed  THOUGHT PROCESS: Circumstantial  THOUGHT CONTENT: Negative Rumination  PERCEPTION: No evidence of hallucinations  CURRENT SUICIDAL IDEATION: patient denies  CURRENT HOMICIDAL IDEATION: Patient denies  ORIENTATION: Alert and Oriented X 3.  CONCENTRATION: Good  MEMORY:   Recent: intact   Remote: intact  COGNITIVE FUNCTION: Average intelligence  JUDGMENT: Intact  IMPULSE CONTROL: Fair  INSIGHT: Fair    Risk Assessment:  ASSESSMENT OF RISK FOR SUICIDAL BEHAVIOR  Changes in risk for suicide from baseline Formulation of Risk and/or previous intake, including newly identified risk, if any: none  Violence risk was assessed and No Change noted from baseline formulation of risk and/or previous assessment.    Session Content:: Patient reports working on OCD. Writer commended patient on this work and reviewed Public house manager. Writer and patient discussed patient's SUDs. Patient will work on not straightening the shoes exposure. Patient talked about her brother "making" her talk to her niece on her niece's birthday and tell her that she needed to do better in school and not lie. She said that her brother cancelled his daughter's birthday as a punishment. Patient talked  about how this made her feel and her concerns for her niece. Writer validated patient's thoughts and feelings. Writer and patient discussed choices and setting limits with her brother.    Visit Diagnosis:      ICD-10-CM ICD-9-CM   1. 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 Mixed obsessional thoughts and acts F42.2 300.3       Interventions:  Provided Psychoeducation  Supportive Psychotherapy  OCD therapy, Solution Focused therapy    Current Treatment Plan   Created/Updated On 05/15/2016   Next Treatment Plan Due 08/12/2016       Plan:  Psychotherapy continues as described in care plan; plan remains the same.    NEXT APPT: 07/12/16 @2       Tresa Res, LCSW

## 2016-07-12 ENCOUNTER — Other Ambulatory Visit: Payer: Self-pay | Admitting: Cardiology

## 2016-07-12 ENCOUNTER — Ambulatory Visit: Payer: Medicare (Managed Care) | Attending: Psychiatry

## 2016-07-12 ENCOUNTER — Emergency Department
Admission: EM | Admit: 2016-07-12 | Discharge: 2016-07-12 | Disposition: A | Discharge status: Home / Self Care | Payer: Medicare (Managed Care) | Source: Ambulatory Visit | Attending: Emergency Medicine | Admitting: Emergency Medicine

## 2016-07-12 ENCOUNTER — Emergency Department: Payer: Medicare (Managed Care)

## 2016-07-12 ENCOUNTER — Encounter: Payer: Self-pay | Admitting: Emergency Medicine

## 2016-07-12 DIAGNOSIS — W19XXXA Unspecified fall, initial encounter: Secondary | ICD-10-CM

## 2016-07-12 DIAGNOSIS — R079 Chest pain, unspecified: Secondary | ICD-10-CM

## 2016-07-12 DIAGNOSIS — F422 Mixed obsessional thoughts and acts: Secondary | ICD-10-CM

## 2016-07-12 DIAGNOSIS — I499 Cardiac arrhythmia, unspecified: Secondary | ICD-10-CM

## 2016-07-12 DIAGNOSIS — S0083XA Contusion of other part of head, initial encounter: Secondary | ICD-10-CM

## 2016-07-12 DIAGNOSIS — M542 Cervicalgia: Secondary | ICD-10-CM

## 2016-07-12 DIAGNOSIS — W1830XA Fall on same level, unspecified, initial encounter: Secondary | ICD-10-CM

## 2016-07-12 DIAGNOSIS — F603 Borderline personality disorder: Secondary | ICD-10-CM | POA: Insufficient documentation

## 2016-07-12 DIAGNOSIS — R51 Headache: Secondary | ICD-10-CM

## 2016-07-12 DIAGNOSIS — F3341 Major depressive disorder, recurrent, in partial remission: Secondary | ICD-10-CM | POA: Insufficient documentation

## 2016-07-12 DIAGNOSIS — R55 Syncope and collapse: Secondary | ICD-10-CM

## 2016-07-12 LAB — CBC AND DIFFERENTIAL
Baso # K/uL: 0.1 10*3/uL (ref 0.0–0.1)
Basophil %: 0.6 %
Eos # K/uL: 0.1 10*3/uL (ref 0.0–0.4)
Eosinophil %: 0.5 %
Hematocrit: 45 % (ref 34–45)
Hemoglobin: 13.7 g/dL (ref 11.2–15.7)
IMM Granulocytes #: 0.1 10*3/uL (ref 0.0–0.1)
IMM Granulocytes: 0.4 %
Lymph # K/uL: 3.3 10*3/uL (ref 1.2–3.7)
Lymphocyte %: 23.5 %
MCH: 27 pg/cell (ref 26–32)
MCHC: 30 g/dL — ABNORMAL LOW (ref 32–36)
MCV: 88 fL (ref 79–95)
Mono # K/uL: 0.6 10*3/uL (ref 0.2–0.9)
Monocyte %: 4.6 %
Neut # K/uL: 9.9 10*3/uL — ABNORMAL HIGH (ref 1.6–6.1)
Nucl RBC # K/uL: 0 10*3/uL (ref 0.0–0.0)
Nucl RBC %: 0 /100 WBC (ref 0.0–0.2)
Platelets: 378 10*3/uL — ABNORMAL HIGH (ref 160–370)
RBC: 5.1 MIL/uL (ref 3.9–5.2)
RDW: 14.1 % (ref 11.7–14.4)
Seg Neut %: 70.4 %
WBC: 14 10*3/uL — ABNORMAL HIGH (ref 4.0–10.0)

## 2016-07-12 LAB — BASIC METABOLIC PANEL
Anion Gap: 19 — ABNORMAL HIGH (ref 7–16)
CO2: 18 mmol/L — ABNORMAL LOW (ref 20–28)
Calcium: 9.5 mg/dL (ref 8.8–10.2)
Chloride: 102 mmol/L (ref 96–108)
Creatinine: 0.91 mg/dL (ref 0.51–0.95)
GFR,Black: 90 *
GFR,Caucasian: 78 *
Glucose: 130 mg/dL — ABNORMAL HIGH (ref 60–99)
Lab: 10 mg/dL (ref 6–20)
Sodium: 139 mmol/L (ref 133–145)

## 2016-07-12 MED ORDER — ACETAMINOPHEN 500 MG PO TABS *I*
1000.0000 mg | ORAL_TABLET | Freq: Once | ORAL | Status: AC
Start: 2016-07-12 — End: 2016-07-12
  Administered 2016-07-12: 1000 mg via ORAL
  Filled 2016-07-12: qty 2

## 2016-07-12 MED ORDER — ONDANSETRON HCL 2 MG/ML IV SOLN *I*
4.0000 mg | Freq: Once | INTRAMUSCULAR | Status: AC
Start: 2016-07-12 — End: 2016-07-12
  Administered 2016-07-12: 4 mg via INTRAVENOUS
  Filled 2016-07-12: qty 2

## 2016-07-12 MED ORDER — SODIUM CHLORIDE 0.9 % IV BOLUS *I*
1000.0000 mL | Freq: Once | Status: AC
Start: 2016-07-12 — End: 2016-07-12
  Administered 2016-07-12: 1000 mL via INTRAVENOUS

## 2016-07-12 NOTE — ED Notes (Addendum)
Pt d/c'd in stable condition. Pt D/c paperwork reviewed; pt expressed understanding. Pt dressed appropriately for the weather. Pt ambulatory; left with family by car to home. Pt VS obtained. IV removed.

## 2016-07-12 NOTE — Discharge Instructions (Signed)
Today you were seen and evaluated today in the Emergency Department at [facility] for fall and syncope.  During your visit, we performed LABS, XRAYS and CT and found no signs of intracranial injury or chest injury.  We gave you Tylenol to treat your pain.  At this time, your symptoms have improved, we are discharging you home with plans to follow up with your primary care physician.      If your symptoms return, worsen, or become persistent then we encourage you to seek further care, either with your primary care physician or here in the Emergency Department. Specific symptoms to look out for include confusion, severe headache, fevers, chills, seizure-like activity or continued chest pain or shortness of breath.  We have included additional educational information and instructions with your discharge packet.  If you have any questions about your care, please refer to this information or ask any nursing or medical staff while you are here in the Emergency Department.  Once you leave, if questions arise, we encourage you to contact your primary care physician for follow up as needed.

## 2016-07-12 NOTE — ED Notes (Signed)
Per pt stated was in strong behavioral health. States went to the bathroom and came out passed out. Pt states has head pain, arm pain, and chest pain. Pt endorses nausea w/out emesis. Pt states this happened x78mo ago and was diagnosed with POTS. Pt denies any visual disturbances.

## 2016-07-12 NOTE — ED Triage Notes (Signed)
Pt was here to see therapist when she had mechanical fall. Now endorsing head pain, neck pain, and left arm tingling. Sensation intact.        Triage Note   Earlie Server, RN

## 2016-07-12 NOTE — ED Provider Notes (Addendum)
History     Chief Complaint   Patient presents with    Fall     HPI Comments: Kaylee Harris is a 41 y.o. female with a past medical history significant for type 2 diabetes, asthma, GERD, prior diverticulitis, depression, anxiety, fibromyalgia, recurrent syncope (?POTS), polysubstance abuse who presents after mechanical fall. Patient reports that her last episode was approximately 3 weeks ago (having formerly had them up to three times weekly). Patient urinating while at her counselor and passed out immediately after leaving the bathroom.  Patient reports waking up with multiple people surrounding her.  Since the fall, she is noted some left-sided chest pain with inspiration.  She reports a mild headache with no neck pain.  She reports that her left arm feels like it's "falling asleep."  There is no notable decrease in strength per the patient.      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 Lower abdomen with cellulitis changes, wound probed and purulent material expressed.  Had PICC line for "multiple infiltrations" of what? D/c-ed home on 10d of Augmentin 9/11.      Anginal pain     Anxiety     Arthritis     Asthma     Complication of anesthesia     Depression     Diabetes mellitus     Previously on SU and metformin, now diet controlled    Diverticulitis 09/2010    Dysfunctional uterine bleeding     Fibromyalgia     GERD (gastroesophageal reflux disease)     Hyperlipidemia     Migraine     Neuromuscular disorder     POTS (postural orthostatic tachycardia syndrome)     Sebaceous cyst of breast     right axilla    Varicella         Past Surgical History:   Procedure Laterality Date    APPENDECTOMY  2016    arthroscopic shoulder surgery Right 2010    Related to lifting    CARDIAC CATHETERIZATION  09/2011     negative    DILATION AND CURETTAGE OF UTERUS  2005, 2007    x2 for menorraghia    LEFT COLECTOMY  01/03/14    Dr Tresa Res    loop recorder  Feb 03 2014    PR COLONOSCOPY THRU COLOTOMY N/A 02/16/2016    Procedure: COLONOSCOPY;  Surgeon: Kelly Splinter, MD;  Location: [facility];  Service: GI    SMALL INTESTINE SURGERY      TONSILLECTOMY       Family History   Problem Relation Age of Onset    Hypertension Father     Diabetes Father     Kidney Disease Father     Elevated lipids Father     Heart attack Father 30    Other Father      PVD    Hypertension Mother     Elevated lipids Mother     Heart attack Mother 1    Diabetes Mother     Eczema Mother     Psoriasis Mother     Hypertension Brother     Heart Disease Brother 54     prinzmetal's angina    Heart Disease Sister      currently having work up  Fainting Sister     Hypertension Sister     Breast cancer Maternal Grandmother     Stroke Other        Social History    reports that she has quit smoking. Her smoking use included Cigarettes. She has a 1.50 pack-year smoking history. She has never used smokeless tobacco. She reports that she drinks alcohol. She reports that she currently engages in sexual activity and has had female partners. She reports that she does not use illicit drugs.    Living Situation     Questions Responses    Patient lives with Significant Other    Comment: Lives with Wife, [name]     Homeless No    Caregiver for other family member No    External Services Mental Health Services    Comment: [name]     Employment Disabled    Domestic Violence Risk No          Problem List     Patient Active Problem List   Diagnosis Code    DM (diabetes mellitus), type 2, uncontrolled E11.65    Migraine with aura G43.109    Asthma J45.909    Hyperlipemia E78.5    Fibromyalgia M79.7    Syncope and collapse- recurrent, working diagnosis vascular vs psychogenic. Non-arrythmic per Dr Lovena Le 10/2014 R55     Depression, major, recurrent, in partial remission F33.41    IUD (intrauterine device) in place Z97.5    Skin lesion of face L98.9    IBS (irritable bowel syndrome) K58.9    Mixed obsessional thoughts and acts F42.2    Carpal tunnel syndrome G56.00    Meralgia paresthetica of left side G57.12    No diabetic retinopathy in both eyes Z03.89    At risk for abuse of opiates Z91.89    History of repeated overdose Z91.5    Borderline personality disorder F60.3    Neck pain M54.2    Fatty liver disease, nonalcoholic A35.5       Review of Systems   Review of Systems   Constitutional: Negative for fatigue.   HENT: Negative for congestion.    Respiratory: Negative for cough and shortness of breath.    Cardiovascular: Positive for chest pain. Negative for palpitations.   Gastrointestinal: Negative for nausea and vomiting.   Endocrine: Negative for polyuria.   Genitourinary: Negative for dysuria.   Musculoskeletal: Positive for back pain, neck pain and neck stiffness.   Skin: Negative for wound.   Neurological: Positive for dizziness (not new) and headaches.   Psychiatric/Behavioral: The patient is not nervous/anxious.        Physical Exam     ED Triage Vitals   BP Heart Rate Heart Rate (via Pulse Ox) Resp Temp Temp src SpO2 (Retired) O2 Device O2 Flow Rate   07/12/16 1512 07/12/16 1512 -- 07/12/16 1512 07/12/16 1512 -- 07/12/16 1512 -- --   135/79 82  18 37.2 C (99 F)  98 %        Weight           07/12/16 1512           129.3 kg (285 lb)                    Physical Exam   Constitutional: She is oriented to person, place, and time. No distress.   HENT:   Head: Normocephalic and atraumatic.   Eyes: EOM are normal. Pupils are equal, round, and reactive to  light.   Neck: Normal range of motion.   No cervical spine tenderness   Cardiovascular: Normal rate and regular rhythm.    No murmur heard.  Pulmonary/Chest: Effort normal. No respiratory distress.   Abdominal: Soft.   Musculoskeletal: Normal range of motion. She  exhibits no edema or deformity.   Neurological: She is alert and oriented to person, place, and time. No cranial nerve deficit. She exhibits normal muscle tone. Coordination normal.   Skin: Skin is warm and dry. She is not diaphoretic.   Psychiatric: She has a normal mood and affect.   Nursing note and vitals reviewed.      Medical Decision Making      Amount and/or Complexity of Data Reviewed  Clinical lab tests: ordered and reviewed  Tests in the radiology section of CPT: ordered and reviewed  Tests in the medicine section of CPT: ordered and reviewed  Review and summarize past medical records: yes  Independent visualization of images, tracings, or specimens: yes        Initial Evaluation:  ED First Provider Contact     Date/Time Event User Comments    07/12/16 1715 ED First Provider Contact SANDIFORD, PATRICK Initial Face to Face Provider Contact          Patient seen by me as above    Assessment:  41 y.o.female comes to the ED with syncope following utilizing the restroom at her counselor's office.  She reports waking up without associated persistent symptoms.  She denies any pre-syncopal palpitations or chest pain.  Chest pain only occurred after fall, likely to be related to fall injury.    Differential Diagnosis includes   Long standing syncope of unknown etiology, no new concerning symptoms to suggest other etiology, less likely to be cardiac arrhythmia, PE or ACS  Musculoskeletal pain  Electrolyte abnormalities  Anemia  Rib fractures  Doubt intracranial hemorrhage    Plan:   Labs: CBC, BMP  EKG  Imaging: Chest x-ray, CT head without contrast    9:20 PM   Labs are notable for mild leukocytosis to 14, chest x-ray demonstrates mildly hyperexpanded lung fields with no focal abnormalities  CT head demonstrates a small soft tissue scalp hematoma on the right forehead, consistent with exam, no other signs of acute intracranial abnormalities.  As the patient has had extensive workup for her episodes of syncope  and is actively taking medications for one cause of syncope, it is unlikely that this is a deviation from her normal chronic medical condition.  Patient is safe for discharge and follow-up with her primary care provider to discuss visit.  Return precautions discussed the patient including chest pain, palpitations or more concerning signs of headache or confusion.    Nigel Bridgeman, MD           Nigel Bridgeman, MD  Resident  07/12/16 2121    Resident Attestation:   The patient was seen by me today during the period of evaluation and treatment in the Emergency Department on 07/12/2016      History:   I interviewed this patient, reviewed the resident's note and agree.  Exam:   I examined this patient, reviewed the resident's note and agree.    Decision Making:   I discussed with the resident his/her decision making and agree.               Jalayna Josten, Jen Mow, MD  07/13/16 1435

## 2016-07-12 NOTE — ED Notes (Signed)
Bed: AC-[facility]  Expected date:   Expected time:   Means of arrival:   Comments:     Suanne Marker, RN  07/12/16 1714

## 2016-07-13 NOTE — Progress Notes (Signed)
Behavioral Health Progress Note     LENGTH OF SESSION: 30 minutes    Contact Type:  Location: On Site    Face to Face     Problem(s)/Goals Addressed from Treatment Plan:    Problem 1:   Treatment Problem #1 05/15/2016   Patient Identified Problem Depression and anxiety       Goal for this problem:    Treatment Goal #1 05/15/2016   Patient Identified Goal Learn DBT and PST skills to manage affect       Progress towards this goal: Problem resolving. Comment: Patient reports a stable mood.    Mental Status Exam:  APPEARANCE: Appears stated age, Well-groomed, Casual  ATTITUDE TOWARD INTERVIEWER: Cooperative  MOTOR ACTIVITY: WNL (within normal limits)  EYE CONTACT: Direct  SPEECH: Normal rate and tone  AFFECT: Neutral  MOOD: Lively and Neutral  THOUGHT PROCESS: Circumstantial  THOUGHT CONTENT: No unusual themes  PERCEPTION: No evidence of hallucinations  CURRENT SUICIDAL IDEATION: patient denies  CURRENT HOMICIDAL IDEATION: Patient denies  ORIENTATION: Alert and Oriented X 3.  CONCENTRATION: Good  MEMORY:   Recent: intact   Remote: intact  COGNITIVE FUNCTION: Average intelligence  JUDGMENT: Intact  IMPULSE CONTROL: Fair  INSIGHT: Fair    Risk Assessment:  ASSESSMENT OF RISK FOR SUICIDAL BEHAVIOR  Changes in risk for suicide from baseline Formulation of Risk and/or previous intake, including newly identified risk, if any: none  Violence risk was assessed and No Change noted from baseline formulation of risk and/or previous assessment.    Session Content:: Patient reports a stable mood. She said that her spouse has been dealing with a multiple sclerosis episode and she is managing her OCD. Writer reviewed patient's use of exposure. Writer and patient discussed patient working on imagery of her trigger around the living room drapes.    Visit Diagnosis:      ICD-10-CM ICD-9-CM   1. 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 Borderline personality disorder F60.3 301.83   3. Mixed  obsessional thoughts and acts F42.2 300.3       Interventions:  Cognitive Behavioral Therapy skills (specifiy skills used):  exposure    Current Treatment Plan   Created/Updated On 05/15/2016   Next Treatment Plan Due 08/12/2016         Plan:  Psychotherapy continues as described in care plan; plan remains the same.    NEXT APPT: not scheduled      Tresa Res, LCSW

## 2016-07-15 ENCOUNTER — Telehealth: Payer: Self-pay

## 2016-07-15 NOTE — Telephone Encounter (Signed)
Follow up form ED- Vasovagal syncope  Call: I called patient and left a message  For her to call PCP  Office  If not feeling better or Sx return   routed to PCP     [facility]

## 2016-07-15 NOTE — Telephone Encounter (Signed)
From: Tresa Res, Marlinda Mike     Sent: 07/13/2016   4:57 PM       To: [facility]    Please contact patient and schedule a follow up appointment for her. Thanks.     Writer contacted pt, will call back to r/s appt w/ Tresa Res.

## 2016-07-17 ENCOUNTER — Other Ambulatory Visit: Payer: Self-pay | Admitting: Psychiatry

## 2016-07-17 MED ORDER — MIRTAZAPINE 15 MG PO TABS *I*
15.0000 mg | ORAL_TABLET | Freq: Every evening | ORAL | 2 refills | Status: DC
Start: 2016-07-17 — End: 2016-10-15

## 2016-07-18 ENCOUNTER — Telehealth: Payer: Self-pay

## 2016-07-18 LAB — EKG 12-LEAD
P: 27 deg
PR: 204 ms
QRS: 55 deg
QRSD: 90 ms
QT: 396 ms
QTc: 469 ms
Rate: 84 {beats}/min
Severity: ABNORMAL
T: 65 deg

## 2016-07-18 NOTE — Telephone Encounter (Signed)
CM spoke to [name] today about her appointment on 07/12/2016.  Kaylee Harris stated that she had fainted while leaving her doctors appointment [facility].  CM reminded Kaylee Harris to follow up with her PCP, which she is reluctant to do due to her current [employer] coverage.  CM stressed the importance of follow up.  CM did remind Kaylee Harris that she is in process for Medicaid and will be meeting with the [employer] to begin the [place] on March 20, at 3:30 pm.  Kaylee Harris's difficulty with independence is apparent when navigating appointments without the support of her spouse, related to current health status.  As a team, could we explore utilizing [facility] services or other options for Kaylee Harris's medical appointments.    Routed to Care Team for CC

## 2016-07-22 ENCOUNTER — Encounter: Payer: Self-pay | Admitting: Registered Nurse

## 2016-07-26 ENCOUNTER — Other Ambulatory Visit: Payer: Self-pay | Admitting: Psychiatry

## 2016-07-26 MED ORDER — LAMOTRIGINE 150 MG PO TABS *I'
150.0000 mg | ORAL_TABLET | Freq: Every day | ORAL | 0 refills | Status: DC
Start: 2016-07-26 — End: 2016-10-21

## 2016-08-05 ENCOUNTER — Other Ambulatory Visit: Payer: Self-pay | Admitting: Primary Care

## 2016-08-05 ENCOUNTER — Encounter: Payer: Self-pay | Admitting: Primary Care

## 2016-08-05 DIAGNOSIS — IMO0002 Reserved for concepts with insufficient information to code with codable children: Secondary | ICD-10-CM

## 2016-08-05 MED ORDER — DULAGLUTIDE 1.5 MG/0.5ML SC SOAJ *I*
1.5000 mg | SUBCUTANEOUS | 5 refills | Status: DC
Start: 2016-08-05 — End: 2016-09-19

## 2016-08-05 MED ORDER — V-GO 30 KIT
PACK | 11 refills | Status: DC
Start: 2016-08-05 — End: 2016-09-21

## 2016-08-12 ENCOUNTER — Encounter: Payer: Self-pay | Admitting: Registered Nurse

## 2016-08-12 ENCOUNTER — Other Ambulatory Visit: Payer: Self-pay | Admitting: Psychiatry

## 2016-08-13 ENCOUNTER — Other Ambulatory Visit: Payer: Self-pay | Admitting: Gastroenterology

## 2016-08-13 ENCOUNTER — Other Ambulatory Visit: Payer: Self-pay | Admitting: Psychiatry

## 2016-08-13 MED ORDER — CLONAZEPAM 0.5 MG PO TABS *I*
0.5000 mg | ORAL_TABLET | Freq: Every day | ORAL | 0 refills | Status: DC | PRN
Start: 2016-08-13 — End: 2017-02-18

## 2016-08-13 NOTE — Progress Notes (Signed)
[facility]   Office Note    Primary Care Physician:   [name], MD   Patient Name: Kaylee Harris  08/13/2016      Subjective: 41 y.o. female was seen in our office today for follow up of Hx of obesity, DM, POTS, Hx of Left hemicolectomy by Dr. [name] in 2014 with generalized abdominal pain and diarrhea. She will see bright red blood in the stool usually when having a bowel movement. She will have diarrhea about 5 times per day. Eating makes the diarrhea worse- will eat and then has diarrhea.  She has tried Dicyclomine before meals but doesn't help.     She has been trying the Lomotil and not helping.     Denies weight loss, fever, chills, nausea, vomiting, foreign travel, or recent antibiotics.   Stool tests are normal.  She tried antibiotics in the     Infectious testing in February 2017 normal. She has chronically had an elevated WBC since 2014- labs reviewed.    Colonoscopy in October 2017: Normal exam except diverticulosis. Pathology: FINAL DIAGNOSIS:   Colon, random, biopsy:   - Colonic mucosa with no significant histologic abnormality.             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 Lower abdomen with cellulitis changes, wound probed and purulent material expressed.  Had PICC line for "multiple infiltrations" of what? D/c-ed home on 10d of Augmentin 9/11.      Anginal pain     Anxiety     Arthritis     Asthma     Complication of anesthesia     Depression     Diabetes mellitus     Previously on SU and metformin, now diet controlled    Diverticulitis 09/2010    Dysfunctional uterine bleeding     Fibromyalgia     GERD (gastroesophageal reflux disease)     Hyperlipidemia     Migraine     Neuromuscular disorder     POTS (postural orthostatic tachycardia syndrome)     Sebaceous cyst of breast     right axilla    Varicella     Past Surgical History:   Procedure  Laterality Date    APPENDECTOMY  2016    arthroscopic shoulder surgery Right 2010    Related to lifting    CARDIAC CATHETERIZATION  09/2011    negative    DILATION AND CURETTAGE OF UTERUS  2005, 2007    x2 for menorraghia    LEFT COLECTOMY  01/03/14    Dr Tresa Res    loop recorder  Feb 03 2014    PR COLONOSCOPY THRU COLOTOMY N/A 02/16/2016    Procedure: COLONOSCOPY;  Surgeon: Kelly Splinter, MD;  Location: [facility];  Service: GI    SMALL INTESTINE SURGERY      TONSILLECTOMY         Social History:   Social History   Substance Use Topics    Smoking status: Former Smoker     Packs/day: 0.50     Years: 3.00     Types: Cigarettes    Smokeless tobacco: Never Used    Alcohol use 0.0 oz/week     0 Standard drinks or equivalent per week      Comment: <1 weekly       Family History:  Family History   Problem Relation Age of Onset    Hypertension Father     Diabetes Father     Kidney Disease Father     Elevated lipids Father     Heart attack Father 76    Other Father      PVD    Hypertension Mother     Elevated lipids Mother     Heart attack Mother 18    Diabetes Mother     Eczema Mother     Psoriasis Mother     Hypertension Brother     Heart Disease Brother 7     prinzmetal's angina    Heart Disease Sister      currently having work up    [place] Sister     Hypertension Sister     Breast cancer Maternal Grandmother     Stroke Other         Medications:   Prior to Admission medications    Medication Sig Start Date End Date Taking? Authorizing Provider   clonazePAM (KLONOPIN) 0.5 MG tablet Take 1 tablet (0.5 mg total) by mouth daily as needed   Max daily dose: 0.5 mg 08/13/16   Corigliano, [name] D, NP   Insulin Disposable Pump (V-GO 30) KIT Use daily; 30ct/month. 08/05/16   [name], MD   dulaglutide (TRULICITY) 1.5 QQ/5.9DG injection pen Inject 0.5 mLs (1.5 mg total) into the skin every 7 days 08/05/16   [name], MD   lamoTRIgine (LAMICTAL) 150 MG tablet Take 1  tablet (150 mg total) by mouth daily 07/26/16   Corigliano, Suann Larry, NP   busPIRone (BUSPAR) 30 MG tablet TAKE 1 TABLET BY MOUTH TWO TIMES DAILY 07/17/16   Corigliano, Suann Larry, NP   mirtazapine (REMERON) 15 MG tablet Take 1 tablet (15 mg total) by mouth nightly 07/17/16   Corigliano, Suann Larry, NP   DULoxetine (CYMBALTA) 60 MG capsule Take 1 capsule (60 mg total) by mouth daily 06/18/16   Lucia Estelle, NP   metoprolol (TOPROL-XL) 25 MG 24 hr tablet  05/24/16   [provider]   traMADol (ULTRAM) 50 MG tablet Take 1 tablet (50 mg total) by mouth every 8 hours as needed (for head pain and abd pain)   Max daily dose: 150 mg 06/04/16   [name], MD   pantoprazole (PROTONIX) 40 MG EC tablet Take 1 tablet (40 mg total) by mouth daily   SWALLOW WHOLE. DO NOT CRUSH, BREAK, OR CHEW. 05/17/16   [name], MD   topiramate (TOPAMAX) 100 MG tablet Take 1 tablet (100 mg total) by mouth 2 times daily 05/16/16   [name], MD   albuterol HFA 108 (90 BASE) MCG/ACT inhaler Inhale 1-2 puffs into the lungs every 6 hours as needed for Wheezing   Shake well before each use. 05/15/16   [name], MD   fluticasone Asencion Islam) 50 MCG/ACT nasal spray 1 spray by Nasal route daily 05/15/16   [name], MD   atorvastatin (LIPITOR) 40 MG tablet TAKE 1 TABLET BY MOUTH EVERY DAY WITH DINNER 05/08/16   [name], MD   insulin glargine 300 UNIT/ML SOPN Inject 30 Units into the skin nightly   FREE voucher will be used; DO NOT bill Insurance 04/25/16   [name], MD   dicyclomine (BENTYL) 20 MG tablet TAKE 1 TABLET (20 MG TOTAL) BY MOUTH 4 TIMES DAILY (BEFORE MEALS AND NIGHTLY) 04/01/16   [name], MD   ascorbic acid (VITAMIN C) 100 MG  tablet Take 100 mg by mouth daily    [provider]   midodrine (PROAMATINE) 10 MG tablet Take 1 tablet (10 mg total) by mouth 3 times daily 01/12/16   Nelta Numbers, MD   insulin syringe-needle U-100 (BD ULTRAFINE) 31G X 5/16" 0.3 ML HALF-UNIT Use 3 times a day as  instructed. 12/28/15   Nelta Numbers, MD   diphenoxylate-atropine (LOMOTIL) 2.5-0.025 MG per tablet Take 1 tablet by mouth 4 times daily as needed for Diarrhea   Max daily dose: 4 tablets 12/05/15   Gift, Sherlon Handing, DO   promethazine (PHENERGAN) 12.5 MG tablet Take 1 tablet (12.5 mg total) by mouth 4 times daily as needed 12/04/15   [name], MD   SUMAtriptan (IMITREX) 50 MG tablet Take 1 tablet (50 mg total) by mouth as needed for Migraine   Take at onset of headache. May repeat once in 2 hours. 11/08/15   [name], MD   insulin lispro (HUMALOG) 100 UNIT/ML injection vial For VGO: Dinner 54Y (6 clicks) ; B & L 4- 6 units. BG 100 - 150 8u (4clk), 151 - 200 10U (5clk), >201 12U ( 6 ck). MDD 72U (36 clk) 09/13/15   [name], MD   FREESTYLE LITE test strip Use four times daily as directed for 250.02 11/21/14   [name], MD   blood glucose monitor system Brand: cheapest brand available per her insurance.  Use as directed. 11/15/14   Woodward Ku, NP   lancets Brand Free Style Lite; Use 2 times per day as directed for blood glucose testing. 11/15/14   Woodward Ku, NP   Alcohol Swabs (ALCOHOL WIPES) PADS Use BID for BG check 10/31/14   [name], MD   famciclovir [facility]) 500 MG tablet Take 1 tablet (500 mg total) by mouth 3 times daily as needed 10/11/14   [name], MD   cetirizine (ZYRTEC) 10 MG tablet Take 10 mg by mouth daily    [provider]   levonorgestrel (MIRENA) 20 MCG/24HR IUD 1 each by Intrauterine route once    [provider]   Non-System Medication The above patient is followed in our clinic and cannot resume work permanently. 02/15/13   Lance Bosch, NP       Allergies:   Allergies   Allergen Reactions    Morphine Anaphylaxis    Toradol [Ketorolac Tromethamine] Anaphylaxis     Notes feeling tightness in her throat and full body itching after last dose a month ago. Also notes erythema tracking along her vein after injection. This is not reflected  by documentation.  Tolerates Naproxen.    Trazodone Anaphylaxis    Morphine Other (See Comments)     unknown    Toradol [Ketorolac Tromethamine] Other (See Comments)     unknown    Trazodone Other (See Comments)     unknown       Objective:  Vital Signs: BP: 142/88 Ht: 68" 5'8" Wt: 287lb Wt Prior: 286lb as of 06/21/16 Wt Dif: +1lb BMI: 43.6 Pulse: 92    General appearance: alert, appears stated age and cooperative  Head: Normocephalic, without obvious abnormality, atraumatic  Eyes: conjunctivae/corneas clear, anicteric  Neck: supple, symmetrical, trachea midline  Lungs: clear to auscultation bilaterally  Heart: regular rate and rhythm, S1, S2 normal, no murmur, click, rub or gallop  Abdomen: soft, non-tender; bowel sounds normal; no masses,  no organomegaly  Extremities: extremities normal, atraumatic, no cyanosis or edema  Rectal: Deferred at  this time  Neurologic: Grossly normal  MSK: Grossly normal  Lymph: No appreciable lymphadenopathy    Lab Results: Lab Results   Component Value Date    WBC 14.0 (H) 07/12/2016    HGB 13.7 07/12/2016    HCT 45 07/12/2016    MCV 88 07/12/2016    PLT 378 (H) 07/12/2016          Lab results: 07/12/16  2057   Sodium 139   Potassium CANCELED   Chloride 102   CO2 18*   UN 10   Creatinine 0.91   GFR,Caucasian 78   GFR,Black 90   Glucose 130*   Calcium 9.5        Lab Results   Component Value Date    ALT 29 06/10/2016    AST 17 06/10/2016     Lab Results   Component Value Date    TB 0.3 06/10/2016      Lab Results   Component Value Date    ALK 114 (H) 06/10/2016      Lab Results   Component Value Date    LIP 39 01/04/2016    AMY 46 01/04/2016      No results for input(s): PTI, INR in the last 8760 hours.      Imaging: CT enterography in 2017:   IMPRESSION:       No dilated loops of bowel are seen. No strictures identified.      Diverticulosis without evidence of diverticulitis.      Small umbilical hernia containing fat.           Assessment/Plan: 41 y.o. female presents for  follow up of Hx of obesity, DM, POTS, Hx of Left hemicolectomy by Dr. [name] in 2014 with generalized abdominal pain and diarrhea.  DDx includes celiac disease, infectious unlikely with normal labs in February 2018, medication effect, IBS or functional.    She has chronically elevated WBC so could consider referral to hematology as going on since at least 2014.  We will try to remove 2 medications that ar not working,.    1. Trial stopping Lomotil and Dicyclomine as not helping.  2. Citrucel 1 TBSP daily for 1 week and then increase to 1 TBSP twice a day if still having diarrhea  3. Lactose intolerant- avoid dairy products or use lactose free products  4. Upper endoscopy at [facility] due to POTS syndrome  5. Goal is 5 servings daily of fruits, vegetables and salad.  6. If not working, we will trial Cholestyramine or Viberzi     Order          :  Upper Endoscopy  Pat Edu        :  MedlinePlus-Diarrhea, unspeci  Follow Up      :  post-procedure     Thank you for allowing me to participate in this patient's care.  Please do not hesistate to contact us with any questions or concerns.    Office #: [phone] or Office Fax: [phone]    Electronically Signed by:  Kelly Splinter, MD  Note created: 08/13/2016  at: 2:46 PM   Cell: [phone]

## 2016-08-14 ENCOUNTER — Encounter: Payer: Self-pay | Admitting: Registered Nurse

## 2016-08-15 ENCOUNTER — Other Ambulatory Visit: Payer: Self-pay

## 2016-08-15 MED ORDER — FLUTICASONE PROPIONATE 50 MCG/ACT NA SUSP *I*
1.0000 | Freq: Every day | NASAL | 3 refills | Status: DC
Start: 2016-08-15 — End: 2016-08-27

## 2016-08-19 ENCOUNTER — Other Ambulatory Visit: Payer: Self-pay | Admitting: Psychiatry

## 2016-08-19 MED ORDER — DULOXETINE HCL 60 MG PO CPEP *I*
60.0000 mg | DELAYED_RELEASE_CAPSULE | Freq: Every day | ORAL | 0 refills | Status: DC
Start: 2016-08-19 — End: 2016-09-09

## 2016-08-23 ENCOUNTER — Other Ambulatory Visit: Payer: Self-pay | Admitting: Primary Care

## 2016-08-23 DIAGNOSIS — R197 Diarrhea, unspecified: Secondary | ICD-10-CM

## 2016-08-23 DIAGNOSIS — R109 Unspecified abdominal pain: Secondary | ICD-10-CM

## 2016-08-23 MED ORDER — TRAMADOL HCL 50 MG PO TABS *I*
50.0000 mg | ORAL_TABLET | Freq: Three times a day (TID) | ORAL | 0 refills | Status: DC | PRN
Start: 2016-08-23 — End: 2016-09-17

## 2016-08-23 NOTE — Telephone Encounter (Signed)
[name] - 8 Prescriptions  Confidential Drug Report  Search Terms: Kenedy Haisley, 1975-05-16   Search Date: 08/23/2016 07:33:07 AM   Searching on behalf of: [id number] - [name]   The Drug Utilization Report below displays all of the controlled substance prescriptions, if any, that your patient has filled in the last twelve months. The information displayed on this report is compiled from pharmacy submissions to the Department, and accurately reflects the information as submitted by the pharmacies.  This report was requested by: Verdell Carmine   Reference #: [id number]   [facility]  Patient Name: Kaylee Harris Birth Date: 02-Jul-1975   Address: [address] [place], [place] [address] Sex: Female   Rx Written Rx Dispensed Drug Quantity Days Supply Prescriber Name Payment Method Dispenser   01/15/2016 01/15/2016 tramadol hcl 50 mg tablet  8 2 [place] [place]. #02   Others' Prescriptions  Patient Name: [name] Birth Date: 1975-12-02   Address: [place], [place] [address] Sex: Female   Rx Written Rx Dispensed Drug Quantity Days Supply Prescriber Name Payment Method Dispenser   08/13/2016 08/19/2016 clonazepam 0.5 mg tablet  30 30 CoriglianoLattie Haw D NP [employer] #02   06/21/2016 06/21/2016 diphenoxylate-atropine 2.5-0.025 mg tablet  240 30 Telford Nab A ([employer] [place]. #02   06/04/2016 06/06/2016 tramadol hcl 50 mg tablet  12 14 [name], Darnell Level (MD) [place]. #02   02/29/2016 03/03/2016 tramadol hcl 50 mg tablet  20 14 [name], Darnell Level (MD) [place]. #02   01/19/2016 01/19/2016 tramadol hcl 50 mg tablet  90 14 [name], Darnell Level (MD) [place]. #02   12/05/2015 12/05/2015 diphenoxylate-atropine 2.5-0.025 mg tablet  56 14 [facility], [place] #02   10/20/2015 10/20/2015 tramadol hcl  50 mg tablet  5 2 [place], [place]. #02   * - Drugs marked with an asterisk are compound drugs. If the compound drug is made up of more than one controlled substance, then each controlled substance will be a separate row in the table.

## 2016-08-27 ENCOUNTER — Other Ambulatory Visit: Payer: Self-pay

## 2016-08-27 MED ORDER — FLUTICASONE PROPIONATE 50 MCG/ACT NA SUSP *I*
1.0000 | Freq: Every day | NASAL | 3 refills | Status: DC
Start: 2016-08-27 — End: 2016-08-27

## 2016-08-27 MED ORDER — FLUTICASONE PROPIONATE 50 MCG/ACT NA SUSP *I*
1.0000 | Freq: Every day | NASAL | 3 refills | Status: DC
Start: 2016-08-27 — End: 2016-11-07

## 2016-08-27 NOTE — Telephone Encounter (Signed)
Patient needs a 90 day supply 

## 2016-08-27 NOTE — Addendum Note (Signed)
Addended by: Grandville Silos on: 08/27/2016 01:56 PM     Modules accepted: Orders

## 2016-09-05 ENCOUNTER — Encounter: Payer: Self-pay | Admitting: Registered Nurse

## 2016-09-05 NOTE — Telephone Encounter (Signed)
Nursing, see pt's mychart  Please triage her heavy chest / SOB    [name], MD  [place]  09/05/2016  2:14 PM

## 2016-09-05 NOTE — Telephone Encounter (Signed)
Patient denies shortness of breath, chest pain, or trouble breathing. No arm/neck/jaw pain.     Per patient she believes this "chest heaviness" is related to the weather change. States that it started yesterday. Has been using her inhaler. Denies in office follow-up at this time. Advised her to call back if symptoms worsen or she develops difficulty breathing.     Routed to provider

## 2016-09-05 NOTE — Telephone Encounter (Signed)
CAlled Kaylee Harris re: high BG & Sx of "chest heavy , SOB"  Advised to contact PCP immediately    FBs recheck 382;  States BG 11pm last night 130;  Changed VGO30 this Am with humalog    Advised to take correction (3 clicks = 6 units now & recheck 1 hr; if bG still 300 or > to call back; may need correction with syringe/vial & change VGo is needed.

## 2016-09-09 ENCOUNTER — Ambulatory Visit: Payer: Medicare (Managed Care)

## 2016-09-09 ENCOUNTER — Encounter: Payer: Self-pay | Admitting: Psychiatry

## 2016-09-09 ENCOUNTER — Ambulatory Visit: Payer: Medicare (Managed Care) | Attending: Psychiatry | Admitting: Psychiatry

## 2016-09-09 VITALS — BP 150/88 | HR 87 | Resp 18 | Ht 67.99 in | Wt 285.0 lb

## 2016-09-09 DIAGNOSIS — F331 Major depressive disorder, recurrent, moderate: Secondary | ICD-10-CM

## 2016-09-09 DIAGNOSIS — F603 Borderline personality disorder: Secondary | ICD-10-CM

## 2016-09-09 DIAGNOSIS — F422 Mixed obsessional thoughts and acts: Secondary | ICD-10-CM

## 2016-09-09 DIAGNOSIS — F330 Major depressive disorder, recurrent, mild: Secondary | ICD-10-CM | POA: Insufficient documentation

## 2016-09-09 DIAGNOSIS — F4312 Post-traumatic stress disorder, chronic: Secondary | ICD-10-CM | POA: Insufficient documentation

## 2016-09-09 NOTE — BH Treatment Plan (Signed)
[facility]     Date of Plan:   Created/Updated On 09/09/2016   FROM 09/09/2016      Created/Updated On 09/09/2016   Next Treatment Plan Due 12/09/2016       Diagnostic Impression    ICD-10-CM ICD-9-CM   1. MDD (major depressive disorder), recurrent episode, moderate F33.1 296.32   2. Mixed obsessional thoughts and acts F42.2 300.3   3. Borderline personality disorder F60.3 301.83   4. Chronic post-traumatic stress disorder (PTSD) F43.12 309.81       Strengths  Strengths derived from the assessment include: Patient is hard working, has supports.    Problem Areas  *At least one problem must be targeted toward risk reduction if Formulation of Risk or any other previous exam indicated special monitoring or intervention for suicide and/or violence risk indicated.    PROBLEM AREAS (choose and describe relevant):  THOUGHT: Negative  MOOD:Depressed and anxious  BEHAVIOR: Reactive and avoidant  ECONOMIC:SSDI  ________________________________________________________________  Treatment Problem #1 09/09/2016   Patient Identified Problem Depression and anxiety        Treatment Goal #1 09/09/2016   Patient Identified Goal Learn DBT and PST skills to manage affect       The rationale for addressing this problem is that resolving it will (select all that apply):  Reduce symptoms of disorder, Reduce functional impairment associated with disorder, Decrease likelihood of hospitalization, Facilitate transfer skills learned in therapy to everday life, Is a key motivational factor for the patient's participation in treatment, Reduce risk for suicide* and Reduce risk for violence*      Progress toward goal(s): Problem resolving. Comment: Patient has been out of treatment with Probation officer for two months. Patient reports continued mood stabilty. Patient has some additional psychosocial stressors: her sister's has esophogeal cancer; partner's grandmother is declining.    1 a. Measurable Objectives : Patient will make and keep  regular individual therapy appointments.   Date established: 05/31/14   Target date: 12/09/16   Attained or Revised? continue    1 b. Measurable Objectives : Patient will continue to learn and utilize skills to manage affect.   Date established: 05/31/14   Target date: 12/09/16   Attained or Revised? continue    ______________________________________________________________________      Plan  TREATMENT MODALITIES:  Individual psychotherapy for 30-60 min Q 1-3 weeks with Tresa Res, LCSW.  Psychopharmacology visits 20-45 min Q 2-8 weeks with [name], NP.     DISCHARGE CRITERIA for this treatment setting: Patient reports a reduction in symptoms as evidenced by PHQ-9 and GAD-7 scores.    Clinician's name: Tresa Res, LCSW    Psychiatrist's Name: Donny Pique, MD      Patient/Family Statement  PATIENT/FAMILY STATEMENT:  Obtain patient and family input into the treatment plan, including areas of agreement / disagreement.  Obtain patient's signature - if not possible, briefly describe the reason.     Patient Comments:          I HAVE PARTICIPATED IN THE DEVELOPMENT OF THIS TREATMENT PLAN AND I AGREE WITH ITS CONTENTS:       Patient Signature: _______________________________________________________    Date: ______________________________

## 2016-09-09 NOTE — Progress Notes (Signed)
Behavioral Health Psychopharmacology Follow-up     Length of Session: 20 minutes.    Diagnosis Addressed    ICD-10-CM ICD-9-CM   1. Borderline personality disorder F60.3 301.83   2. Mixed obsessional thoughts and acts F42.2 300.3   3. Depression, major, recurrent, mild F33.0 296.31       Recent History and Response to Medications  Patient states: she has been focusing on other people and not herself.  She says she is a little worried and anxious about her sister who was diagnosed with thyroid CA and her wife's grandmother.  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  She became tearful when discussing it in session.  Patient says she is doing well on her current medications.  She has not been taking her Klonopin much.   She ha not been sleeping as well despite being on the mirtazapine.  She admits to doing a lot of "what if" thinking.  She reports the cymbalta is stil helping with her mood and the pain and the Lamictal helps with her mood.     HPI     Follow-up    Additional comments: patient is here to follow up on her medications       Last edited by Shirlee More, LPN on 09/09/4330  9:51 PM. (History)          Current use of alcohol or drugs: No        Neurovegetative Symptoms Review:  Energy level: fair  Concentration: fair  Sleep Quality: fair   Number of hours :  (6)  Appetite: fair     Wt Readings from Last 3 Encounters:   09/09/16 129.3 kg (285 lb)   07/12/16 129.3 kg (285 lb)   06/10/16 129.3 kg (285 lb)     Enjoyment/interest: fair      Current Medications  Current Outpatient Prescriptions   Medication Sig    fluticasone (FLONASE) 50 MCG/ACT nasal spray 1 spray by Nasal route daily    traMADol (ULTRAM) 50 MG tablet Take 1 tablet (50 mg total) by mouth every 8 hours as needed (for head pain and abd pain)   Max daily dose: 150 mg    DULoxetine (CYMBALTA) 60 MG capsule TAKE 1 CAPSULE BY MOUTH EVERY DAY    Insulin Disposable Pump (V-GO  30) KIT Use daily; 30ct/month.    dulaglutide (TRULICITY) 1.5 OA/4.1YS injection pen Inject 0.5 mLs (1.5 mg total) into the skin every 7 days    lamoTRIgine (LAMICTAL) 150 MG tablet Take 1 tablet (150 mg total) by mouth daily    busPIRone (BUSPAR) 30 MG tablet TAKE 1 TABLET BY MOUTH TWO TIMES DAILY    mirtazapine (REMERON) 15 MG tablet Take 1 tablet (15 mg total) by mouth nightly    metoprolol (TOPROL-XL) 25 MG 24 hr tablet     topiramate (TOPAMAX) 100 MG tablet Take 1 tablet (100 mg total) by mouth 2 times daily    atorvastatin (LIPITOR) 40 MG tablet TAKE 1 TABLET BY MOUTH EVERY DAY WITH DINNER    insulin glargine 300 UNIT/ML SOPN Inject 30 Units into the skin nightly   FREE voucher will be used; DO NOT bill Insurance    dicyclomine (BENTYL) 20 MG tablet TAKE 1 TABLET (20 MG TOTAL) BY MOUTH 4 TIMES DAILY (BEFORE MEALS AND NIGHTLY)    ascorbic acid (VITAMIN C) 100 MG tablet Take 100 mg by mouth daily  midodrine (PROAMATINE) 10 MG tablet Take 1 tablet (10 mg total) by mouth 3 times daily    insulin syringe-needle U-100 (BD ULTRAFINE) 31G X 5/16" 0.3 ML HALF-UNIT Use 3 times a day as instructed.    diphenoxylate-atropine (LOMOTIL) 2.5-0.025 MG per tablet Take 1 tablet by mouth 4 times daily as needed for Diarrhea   Max daily dose: 4 tablets    insulin lispro (HUMALOG) 100 UNIT/ML injection vial For VGO: Dinner 10U (6 clicks) ; B & L 4- 6 units. BG 100 - 150 8u (4clk), 151 - 200 10U (5clk), >201 12U ( 6 ck). MDD 72U (36 clk)    FREESTYLE LITE test strip Use four times daily as directed for 250.02    blood glucose monitor system Brand: cheapest brand available per her insurance.  Use as directed.    lancets Brand Free Style Lite; Use 2 times per day as directed for blood glucose testing.    Alcohol Swabs (ALCOHOL WIPES) PADS Use BID for BG check    cetirizine (ZYRTEC) 10 MG tablet Take 10 mg by mouth daily    levonorgestrel (MIRENA) 20 MCG/24HR IUD 1 each by Intrauterine route once    clonazePAM  (KLONOPIN) 0.5 MG tablet Take 1 tablet (0.5 mg total) by mouth daily as needed   Max daily dose: 0.5 mg    pantoprazole (PROTONIX) 40 MG EC tablet Take 1 tablet (40 mg total) by mouth daily   SWALLOW WHOLE. DO NOT CRUSH, BREAK, OR CHEW.    albuterol HFA 108 (90 BASE) MCG/ACT inhaler Inhale 1-2 puffs into the lungs every 6 hours as needed for Wheezing   Shake well before each use.    promethazine (PHENERGAN) 12.5 MG tablet Take 1 tablet (12.5 mg total) by mouth 4 times daily as needed    SUMAtriptan (IMITREX) 50 MG tablet Take 1 tablet (50 mg total) by mouth as needed for Migraine   Take at onset of headache. May repeat once in 2 hours.    famciclovir (FAMVIR) 500 MG tablet Take 1 tablet (500 mg total) by mouth 3 times daily as needed    Non-System Medication The above patient is followed in our clinic and cannot resume work permanently.     No current facility-administered medications for this visit.        Side Effects  Patient Reported Side Effects: None reported    Mental Status  APPEARANCE: Casual  ATTITUDE TOWARD INTERVIEWER: Cooperative  MOTOR ACTIVITY: WNL (within normal limits)  EYE CONTACT: Direct  SPEECH: Normal rate and tone  AFFECT: Full Range, Sad and Appropriate  MOOD: Anxious and Depressed  THOUGHT PROCESS: Normal  THOUGHT CONTENT: Negative Rumination  PERCEPTION: Within normal limits  ORIENTATION: Alert and Oriented X 3.  CONCENTRATION: Fair  MEMORY:   Recent: intact   Remote: intact  COGNITIVE FUNCTION: Average intelligence  JUDGMENT: Intact  IMPULSE CONTROL: Poor  INSIGHT: Fair    Risk Assessment    Self Injury: Patient Denies  Suicidal Ideation: Patient Denies  Homicidal Ideation: Patient Denies  Aggressive Behavior: Patient Denies    Results  none    BP Readings from Last 3 Encounters:   09/09/16 150/88   07/12/16 (!) 130/92   06/10/16 121/75       Assessment  FORMULATION:  Patient has had an increase in stressors which has caused her level of depression and to go up some.  But overall she  seems to be doing well on her current medications.  Writer encouraged her to  use some of the mindfulness skills to control the negative rumination and worry thoughts.  We discussed not making any changes in her medications since the stress seems situational and her mood was in fairly good control prior to the current stressors.  Patient was in agreement with this.     Recommendations/Plan and Rationale  PLAN: Continue current medications.  Return in 8 weeks.

## 2016-09-09 NOTE — Progress Notes (Signed)
Behavioral Health Progress Note     LENGTH OF SESSION: 45 minutes    Contact Type:  Location: On Site    Face to Face     Problem(s)/Goals Addressed from Treatment Plan:    Problem 1:   Treatment Problem #1 09/09/2016   Patient Identified Problem Depression and anxiety       Goal for this problem:    Treatment Goal #1 09/09/2016   Patient Identified Goal Learn DBT and PST skills to manage affect       Progress towards this goal: Problem resolving. Comment: Patient reports continued mood stability.    Mental Status Exam:  APPEARANCE: Appears stated age, Well-groomed, Casual  ATTITUDE TOWARD INTERVIEWER: Cooperative  MOTOR ACTIVITY: WNL (within normal limits)  EYE CONTACT: Direct  SPEECH: Normal rate and tone and Pressured  AFFECT: Full Range  MOOD: Normal and Sad  THOUGHT PROCESS: Normal and Circumstantial  THOUGHT CONTENT: No unusual themes and Negative Rumination  PERCEPTION: No evidence of hallucinations  CURRENT SUICIDAL IDEATION: patient denies  CURRENT HOMICIDAL IDEATION: Patient denies  ORIENTATION: Alert and Oriented X 3.  CONCENTRATION: Good  MEMORY:   Recent: intact   Remote: intact  COGNITIVE FUNCTION: Average intelligence  JUDGMENT: Intact  IMPULSE CONTROL: Good and Fair  INSIGHT: Fair    Risk Assessment:  ASSESSMENT OF RISK FOR SUICIDAL BEHAVIOR  Changes in risk for suicide from baseline Formulation of Risk and/or previous intake, including newly identified risk, if any: none  Violence risk was assessed and No Change noted from baseline formulation of risk and/or previous assessment.    Session Content:: Patient has not attended treatment since 07/12/16. Patient reports continued mood stability. She said that she has had no intrusive thoughts of self harm/SI. Patient talked about her sister telling her that she has esophogeal cancer while they were at a concert for her niece. She said that this was upsetting news and became tearful. Writer validated patient's thoughts and feelings. Writer and patient  discussed patient working on [employer], staying present centered, and changing her sister's future narrative from worst case scenario to resolving. Patient told Probation officer that her wife's grandmother is declining and had an event. She said that she was moved to [facility]. Patient said that she went to visit her and the first time there was a medication cart outside her door and this brought up feelings. She said that she realizes how much she misses work. Writer normalized this for patient. Writer and patient discussed her fainting episode the last time she was in clinic two months ago. Writer explained the connection between talking to patient about exposure work and the episode. Patient told Probation officer that she has done some exposure work around not moving her wife's sneakers. She said that she is still not able to manage this behavior with regard to the curtains.    Visit Diagnosis:      ICD-10-CM ICD-9-CM   1. MDD (major depressive disorder), recurrent episode, moderate F33.1 296.32   2. Mixed obsessional thoughts and acts F42.2 300.3   3. Borderline personality disorder F60.3 301.83   4. Chronic post-traumatic stress disorder (PTSD) F43.12 309.81     Interventions:  Provided Psychoeducation  Supportive Psychotherapy  Treatment Planning  Solution Focused therapy    Current Treatment Plan   Created/Updated On 09/09/2016   Next Treatment Plan Due 12/09/2016     Plan:  Psychotherapy continues as described in care plan; plan remains the same.    NEXT APPT: 10/07/16  Tresa Res, LCSW

## 2016-09-11 ENCOUNTER — Ambulatory Visit: Payer: Medicare (Managed Care) | Admitting: Registered Nurse

## 2016-09-12 ENCOUNTER — Encounter: Payer: Self-pay | Admitting: Primary Care

## 2016-09-12 DIAGNOSIS — R350 Frequency of micturition: Secondary | ICD-10-CM

## 2016-09-13 ENCOUNTER — Ambulatory Visit: Payer: Medicare (Managed Care) | Attending: Primary Care | Admitting: Primary Care

## 2016-09-13 ENCOUNTER — Other Ambulatory Visit: Payer: Self-pay | Admitting: Gastroenterology

## 2016-09-13 ENCOUNTER — Encounter: Payer: Self-pay | Admitting: Primary Care

## 2016-09-13 ENCOUNTER — Other Ambulatory Visit
Admission: RE | Admit: 2016-09-13 | Discharge: 2016-09-13 | Disposition: A | Discharge status: Home / Self Care | Payer: Medicare (Managed Care) | Source: Ambulatory Visit

## 2016-09-13 ENCOUNTER — Inpatient Hospital Stay: Admit: 2016-09-13 | Discharge: 2016-09-13 | Disposition: A | Discharge status: Home / Self Care | Payer: Self-pay

## 2016-09-13 VITALS — BP 122/80 | HR 92 | Temp 97.5°F | Ht 68.0 in | Wt 285.4 lb

## 2016-09-13 DIAGNOSIS — R339 Retention of urine, unspecified: Secondary | ICD-10-CM | POA: Insufficient documentation

## 2016-09-13 DIAGNOSIS — T8339XA Other mechanical complication of intrauterine contraceptive device, initial encounter: Secondary | ICD-10-CM | POA: Insufficient documentation

## 2016-09-13 DIAGNOSIS — R935 Abnormal findings on diagnostic imaging of other abdominal regions, including retroperitoneum: Secondary | ICD-10-CM | POA: Insufficient documentation

## 2016-09-13 DIAGNOSIS — R1030 Lower abdominal pain, unspecified: Secondary | ICD-10-CM | POA: Insufficient documentation

## 2016-09-13 DIAGNOSIS — R350 Frequency of micturition: Secondary | ICD-10-CM

## 2016-09-13 LAB — URINALYSIS WITH MICROSCOPIC
Blood,UA: NEGATIVE
Hyaline Casts,UA: 4 /lpf — ABNORMAL HIGH (ref 0–2)
Ketones, UA: NEGATIVE
Leuk Esterase,UA: NEGATIVE
Nitrite,UA: NEGATIVE
Protein,UA: NEGATIVE mg/dL
RBC,UA: <1 /hpf (ref 0–2)
Specific Gravity,UA: 1.014 (ref 1.002–1.030)
WBC,UA: NONE SEEN /hpf (ref 0–5)
pH,UA: 6 (ref 5.0–8.0)

## 2016-09-13 LAB — AEROBIC CULTURE

## 2016-09-13 MED ORDER — TAMSULOSIN HCL 0.4 MG PO CAPS *I*
0.4000 mg | ORAL_CAPSULE | Freq: Every evening | ORAL | 5 refills | Status: DC
Start: 2016-09-13 — End: 2016-10-04

## 2016-09-13 NOTE — Patient Instructions (Addendum)
Stop Bentyl, lomotil, tramadol, zyrtec, remeron    [facility]  Ultrasound  [phone]  [place] from [place]

## 2016-09-13 NOTE — Progress Notes (Signed)
[facility]    SUBJECTIVE    Pt is here to discuss:    Chief Complaint   Patient presents with    Hospital Follow-up     unable to urinate. had to be straight cathed    Other     only able to void in small amounts    Abdominal Pain       1. Urinary frequency and nocturia x4d. Yesterday had anuria x4h despite strong urges to urinate. Then developed abd pain, hallucinations (saw a bird). Seen at [facility] where she was unable to void, presented to ED. Bladder scan showed 500cc, straigh cath-ed. BG was 240s. UA negative. Had spontaneous voiding x3 thereafter. IVF helped with her visual hallucinations and they haven't recurred since. Given phenergan and d/c-ed home without further treatments.       Had CT abd/pelvis-    1. No renal or ureteral stones. No hydronephrosis.  2. Central hazy mesenteric stranding and small mesenteric lymph nodes could indicate a mesenteric lymphadenitis or occult enteritis.  3. Fatty infiltration with hepatomegaly. New heterogeneous density along the superior right lobe of the liver is indeterminate on this noncontrast study. Differential includes geographic fatty infiltration, vascular abnormality or infection. Tumor is thought to be less likely given configuration. Correlate clinically. MRI of the abdomen with IV contrast could provide improved characterization of this region if clinically indicated.  4. IUD within the uterus with left limb extending to the serosal surface    Overnight and today having urinary hesitancy still. Minimal UOP. +feels similar to yesterday. Denies fevers, chills, recurrent hallucinations, change in bowel habits, nausea, vomiting.       PMH / Family Hx / Social Hx  Patient's medications, allergies, problem list, past medical, social histories were reviewed and notable for:    Current Outpatient Prescriptions   Medication Sig Note    tamsulosin (FLOMAX) 0.4 MG capsule Take 1 capsule (0.4 mg total) by mouth every evening     fluticasone (FLONASE) 50  MCG/ACT nasal spray 1 spray by Nasal route daily     traMADol (ULTRAM) 50 MG tablet Take 1 tablet (50 mg total) by mouth every 8 hours as needed (for head pain and abd pain)   Max daily dose: 150 mg     DULoxetine (CYMBALTA) 60 MG capsule TAKE 1 CAPSULE BY MOUTH EVERY DAY     clonazePAM (KLONOPIN) 0.5 MG tablet Take 1 tablet (0.5 mg total) by mouth daily as needed   Max daily dose: 0.5 mg     Insulin Disposable Pump (V-GO 30) KIT Use daily; 30ct/month.     dulaglutide (TRULICITY) 1.5 PI/9.5JO injection pen Inject 0.5 mLs (1.5 mg total) into the skin every 7 days     lamoTRIgine (LAMICTAL) 150 MG tablet Take 1 tablet (150 mg total) by mouth daily     busPIRone (BUSPAR) 30 MG tablet TAKE 1 TABLET BY MOUTH TWO TIMES DAILY     mirtazapine (REMERON) 15 MG tablet Take 1 tablet (15 mg total) by mouth nightly     metoprolol (TOPROL-XL) 25 MG 24 hr tablet  06/10/2016: Received from: External Pharmacy    pantoprazole (PROTONIX) 40 MG EC tablet Take 1 tablet (40 mg total) by mouth daily   SWALLOW WHOLE. DO NOT CRUSH, BREAK, OR CHEW.     topiramate (TOPAMAX) 100 MG tablet Take 1 tablet (100 mg total) by mouth 2 times daily     albuterol HFA 108 (90 BASE) MCG/ACT inhaler Inhale 1-2 puffs into the lungs  every 6 hours as needed for Wheezing   Shake well before each use.     atorvastatin (LIPITOR) 40 MG tablet TAKE 1 TABLET BY MOUTH EVERY DAY WITH DINNER     insulin glargine 300 UNIT/ML SOPN Inject 30 Units into the skin nightly   FREE voucher will be used; DO NOT bill Insurance     dicyclomine (BENTYL) 20 MG tablet TAKE 1 TABLET (20 MG TOTAL) BY MOUTH 4 TIMES DAILY (BEFORE MEALS AND NIGHTLY)     ascorbic acid (VITAMIN C) 100 MG tablet Take 100 mg by mouth daily     midodrine (PROAMATINE) 10 MG tablet Take 1 tablet (10 mg total) by mouth 3 times daily     insulin syringe-needle U-100 (BD ULTRAFINE) 31G X 5/16" 0.3 ML HALF-UNIT Use 3 times a day as instructed.     promethazine (PHENERGAN) 12.5 MG tablet Take 1  tablet (12.5 mg total) by mouth 4 times daily as needed     SUMAtriptan (IMITREX) 50 MG tablet Take 1 tablet (50 mg total) by mouth as needed for Migraine   Take at onset of headache. May repeat once in 2 hours.     insulin lispro (HUMALOG) 100 UNIT/ML injection vial For VGO: Dinner 24M (6 clicks) ; B & L 4- 6 units. BG 100 - 150 8u (4clk), 151 - 200 10U (5clk), >201 12U ( 6 ck). MDD 72U (36 clk)     FREESTYLE LITE test strip Use four times daily as directed for 250.02     blood glucose monitor system Brand: cheapest brand available per her insurance.  Use as directed.     lancets Brand Free Style Lite; Use 2 times per day as directed for blood glucose testing.     Alcohol Swabs (ALCOHOL WIPES) PADS Use BID for BG check     famciclovir (FAMVIR) 500 MG tablet Take 1 tablet (500 mg total) by mouth 3 times daily as needed     cetirizine (ZYRTEC) 10 MG tablet Take 10 mg by mouth daily     levonorgestrel (MIRENA) 20 MCG/24HR IUD 1 each by Intrauterine route once     Non-System Medication The above patient is followed in our clinic and cannot resume work permanently.      Not really taking the bentyl or tramadol.        OBJECTIVE  Vitals:    09/13/16 1041   BP: 122/80   BP Location: Right arm   Patient Position: Sitting   Pulse: 92   Temp: 36.4 C (97.5 F)   TempSrc: Temporal   Weight: 129.5 kg (285 lb 6.4 oz)   Height: 1.727 m ('5\' 8"' )         General: well-appearing Caucasian female, pleasant & conversant, in NAD  Eyes:. PERRLA. EOMI. Conjunctiva pink, without swelling or exudate. Lids normal.Sclera anicteric.   ENT: Moist mucous membranes.    Cardiac: RRR, no M/R/G. No pedal edema. Pulses 2+ b/l.     Abd: S/ND. No bladder edge palpated at abdomen. TTP in RUQ and R flank.  Mild RLQ discomfort. Normoactive BS.  No masses or organomegaly.  No guarding or rebound tenderness.  GU: normal external female genitalia. Urethral meatus normal appearing.  no discharge at vaginal introitus.  Normal nulliparous cervix, no  bleeding or blood visible. IUD strings visualized.  No masses in vaginal vault.  No CMT, no adnexal masses.   No bladder or uterine fundus palpated.  Psych: AAOx3, normal affect and mood. Insight and judgement intact.  ASSESSMENT & PLAN  1. Urinary retention  2. Urinary frequency  - Urinalysis with microscopic; Future  - Aerobic culture; Future  - N. Gonorrhoeae DNA amplification; Future  - Chlamydia plasmid DNA amplification; Future  - Chlamydia plasmid DNA amplification  - N. Gonorrhoeae DNA amplification    Attempted to get urine today in office but pt was unable to void. GC/CT also collected. Records reviewed; no signs of uti or renal abnormality or hydronephrosis or PID. No objective retention or bladder enlargement on exam today.     Requested pt submit urine later today or over weekend. Reviewed medications that may be contributing and encouraged to avoid bentyl, lomotil, tramadol over weekend.  Will attempt symptomatic relief with flomax. Pt to call if still oliguria or anuria, or consider returning to ED for catheterization if sx persist      3. Abnormal CT of the abdomen  Liver finding - Will f/u again next week - if still symptomatic in RUQ will get mri imaging. Low threshold to do this given her chronic leukocytosis.   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 No fevers or change to BMs or suspected infectious process.  Will monitor again in 1 week.    4. IUD mechanical complication  Strings appear in place. Will get formal GYN [place] to confirm if malpositioned within endometrium/myometrium. If not, can leave in place for additional 41yr.   - GYN ultrasound scan; Future        Follow-up: next week      JJohny Drilling MD  [place] 09/13/2016  10:43 AM        ______________________

## 2016-09-14 LAB — AEROBIC CULTURE: Aerobic Culture: 0

## 2016-09-16 ENCOUNTER — Encounter: Payer: Self-pay | Admitting: Primary Care

## 2016-09-16 LAB — N. GONORRHOEAE DNA AMPLIFICATION: N. gonorrhoeae DNA Amplification: 0

## 2016-09-16 LAB — CHLAMYDIA PLASMID DNA AMPLIFICATION: Chlamydia Plasmid DNA Amplification: 0

## 2016-09-17 ENCOUNTER — Ambulatory Visit: Payer: Medicare (Managed Care) | Admitting: Pulmonary and Critical Care Medicine

## 2016-09-17 ENCOUNTER — Ambulatory Visit: Payer: Medicare (Managed Care)

## 2016-09-17 ENCOUNTER — Other Ambulatory Visit
Admission: RE | Admit: 2016-09-17 | Discharge: 2016-09-17 | Disposition: A | Discharge status: Home / Self Care | Payer: Medicare (Managed Care) | Source: Ambulatory Visit | Attending: Pulmonary and Critical Care Medicine | Admitting: Pulmonary and Critical Care Medicine

## 2016-09-17 ENCOUNTER — Telehealth: Payer: Self-pay | Admitting: Primary Care

## 2016-09-17 ENCOUNTER — Encounter: Payer: Self-pay | Admitting: Pulmonary and Critical Care Medicine

## 2016-09-17 ENCOUNTER — Encounter: Payer: Self-pay | Admitting: Emergency Medicine

## 2016-09-17 ENCOUNTER — Ambulatory Visit
Admission: RE | Admit: 2016-09-17 | Discharge: 2016-09-17 | Disposition: A | Discharge status: Home / Self Care | Payer: Medicare (Managed Care) | Source: Ambulatory Visit

## 2016-09-17 ENCOUNTER — Telehealth: Payer: Self-pay | Admitting: Pulmonary and Critical Care Medicine

## 2016-09-17 ENCOUNTER — Emergency Department
Admission: EM | Admit: 2016-09-17 | Discharge: 2016-09-17 | Disposition: A | Discharge status: Home / Self Care | Payer: Medicare (Managed Care) | Source: Ambulatory Visit | Attending: Emergency Medicine | Admitting: Emergency Medicine

## 2016-09-17 VITALS — BP 144/88 | HR 112 | Temp 98.2°F | Ht 68.0 in | Wt 283.2 lb

## 2016-09-17 DIAGNOSIS — Z760 Encounter for issue of repeat prescription: Secondary | ICD-10-CM

## 2016-09-17 DIAGNOSIS — T8339XA Other mechanical complication of intrauterine contraceptive device, initial encounter: Secondary | ICD-10-CM

## 2016-09-17 DIAGNOSIS — R339 Retention of urine, unspecified: Secondary | ICD-10-CM | POA: Insufficient documentation

## 2016-09-17 DIAGNOSIS — R5383 Other fatigue: Secondary | ICD-10-CM

## 2016-09-17 DIAGNOSIS — R1030 Lower abdominal pain, unspecified: Secondary | ICD-10-CM

## 2016-09-17 DIAGNOSIS — Z30431 Encounter for routine checking of intrauterine contraceptive device: Secondary | ICD-10-CM

## 2016-09-17 DIAGNOSIS — R102 Pelvic and perineal pain: Secondary | ICD-10-CM | POA: Insufficient documentation

## 2016-09-17 DIAGNOSIS — R39198 Other difficulties with micturition: Secondary | ICD-10-CM

## 2016-09-17 DIAGNOSIS — Z87891 Personal history of nicotine dependence: Secondary | ICD-10-CM | POA: Insufficient documentation

## 2016-09-17 DIAGNOSIS — R110 Nausea: Secondary | ICD-10-CM

## 2016-09-17 LAB — URINALYSIS REFLEX TO CULTURE
Blood,UA: NEGATIVE
Ketones, UA: NEGATIVE
Leuk Esterase,UA: NEGATIVE
Nitrite,UA: NEGATIVE
Protein,UA: NEGATIVE mg/dL
Specific Gravity,UA: 1.009 (ref 1.001–1.030)
pH,UA: 5 (ref 5.0–8.0)

## 2016-09-17 LAB — BASIC METABOLIC PANEL
Anion Gap: 16 (ref 7–16)
Anion Gap: 13 (ref 7–16)
CO2: 20 mmol/L (ref 20–28)
CO2: 22 mmol/L (ref 20–28)
Calcium: 9.9 mg/dL (ref 8.8–10.2)
Calcium: 9.5 mg/dL (ref 8.8–10.2)
Chloride: 102 mmol/L (ref 96–108)
Chloride: 104 mmol/L (ref 96–108)
Creatinine: 0.84 mg/dL (ref 0.51–0.95)
Creatinine: 0.76 mg/dL (ref 0.51–0.95)
GFR,Black: 99 *
GFR,Black: 112 *
GFR,Caucasian: 86 *
GFR,Caucasian: 97 *
Glucose: 280 mg/dL — ABNORMAL HIGH (ref 60–99)
Glucose: 203 mg/dL — ABNORMAL HIGH (ref 60–99)
Lab: 9 mg/dL (ref 6–20)
Lab: 8 mg/dL (ref 6–20)
Potassium: 4.3 mmol/L (ref 3.3–5.1)
Potassium: 4.2 mmol/L (ref 3.3–5.1)
Sodium: 138 mmol/L (ref 133–145)
Sodium: 139 mmol/L (ref 133–145)

## 2016-09-17 LAB — CBC AND DIFFERENTIAL
Baso # K/uL: 0.1 10*3/uL (ref 0.0–0.1)
Basophil %: 0.7 %
Eos # K/uL: 0.1 10*3/uL (ref 0.0–0.4)
Eosinophil %: 1.2 %
Hematocrit: 39 % (ref 34–45)
Hemoglobin: 12.6 g/dL (ref 11.2–15.7)
IMM Granulocytes #: 0.1 10*3/uL (ref 0.0–0.1)
IMM Granulocytes: 0.7 %
Lymph # K/uL: 2.8 10*3/uL (ref 1.2–3.7)
Lymphocyte %: 26 %
MCH: 27 pg (ref 26–32)
MCHC: 32 g/dL (ref 32–36)
MCV: 84 fL (ref 79–95)
Mono # K/uL: 0.5 10*3/uL (ref 0.2–0.9)
Monocyte %: 4.8 %
Neut # K/uL: 7.1 10*3/uL — ABNORMAL HIGH (ref 1.6–6.1)
Nucl RBC # K/uL: 0 10*3/uL (ref 0.0–0.0)
Nucl RBC %: 0 /100 WBC (ref 0.0–0.2)
Platelets: 337 10*3/uL (ref 160–370)
RBC: 4.6 MIL/uL (ref 3.9–5.2)
RDW: 13.7 % (ref 11.7–14.4)
Seg Neut %: 66.6 %
WBC: 10.7 10*3/uL — ABNORMAL HIGH (ref 4.0–10.0)

## 2016-09-17 LAB — PREGNANCY TEST, SERUM: Preg,Serum: NEGATIVE

## 2016-09-17 LAB — RUQ PANEL (ED ONLY)
ALT: 33 U/L (ref 0–35)
AST: 25 U/L (ref 0–35)
Albumin: 4.2 g/dL (ref 3.5–5.2)
Alk Phos: 129 U/L — ABNORMAL HIGH (ref 35–105)
Amylase: 32 U/L (ref 28–100)
Bilirubin,Direct: <0.2 mg/dL (ref 0.0–0.3)
Bilirubin,Total: 0.3 mg/dL (ref 0.0–1.2)
Globulin: 2.6 g/dL — ABNORMAL LOW (ref 2.7–4.3)
Lipase: 31 U/L (ref 13–60)
Total Protein: 6.8 g/dL (ref 6.3–7.7)

## 2016-09-17 MED ORDER — TRAMADOL HCL 50 MG PO TABS *I*
50.0000 mg | ORAL_TABLET | Freq: Once | ORAL | Status: AC
Start: 2016-09-17 — End: 2016-09-17
  Administered 2016-09-17: 50 mg via ORAL
  Filled 2016-09-17: qty 1

## 2016-09-17 MED ORDER — TRAMADOL HCL 50 MG PO TABS *I*
50.0000 mg | ORAL_TABLET | Freq: Three times a day (TID) | ORAL | 0 refills | Status: DC | PRN
Start: 2016-09-17 — End: 2016-09-27

## 2016-09-17 NOTE — Progress Notes (Signed)
[place] - Outpatient Progress Note    SUBJECTIVE    Kaylee Harris is a 41 y.o. female who presents, for Abdominal Pain; Nausea; and Unable to urinate    1. RLQ Abd pain: was seen 5/11 by PCP for abd pain and urinary retention, UA/culture ordered, also had STD testing-all negative results  -worsenend abd pain, RLQ  -was able to urinate with Flomax over the weekend, since last night unable to urinate  -last time she urinated was last night, min. output  -unable to void today at home and in the office x 2 attempts  -no fever, chills, no costipation  -some nausea, no vomiting  -is taking tamsulosin 0.4 mg at qhs with some relief in the beginning  -6/10 pain    Recent hx of tx:  -UC/ED visit 5/10 for urinary retention:  Bladder scan showed 500cc, straigh cath-ed. BG was 240s. UA negative. Had spontaneous voiding x3 thereafter. IVF helped with her visual hallucinations and they haven't recurred since. Given phenergan and d/c-ed home without further treatments.      Had CT abd/pelvis-    1. No renal or ureteral stones. No hydronephrosis.  2. Central hazy mesenteric stranding and small mesenteric lymph nodes could indicate a mesenteric lymphadenitis or occult enteritis.  3. Fatty infiltration with hepatomegaly. New heterogeneous density along the superior right lobe of the liver is indeterminate on this noncontrast study. Differential includes geographic fatty infiltration, vascular abnormality or infection. Tumor is thought to be less likely given configuration. Correlate clinically. MRI of the abdomen with IV contrast could provide improved characterization of this region if clinically indicated.  4. IUD within the uterus with left limb extending to the serosal surface    2. Back pain: started in ED and is worsening now, R lower pain, new pain, does not radiate, no numbness tingeling  -has not taken any meds for this pain  -pain is constant, cannot name aggrevating factors  -has not tried relaxation        Patient's medications were reviewed and reconciled with the patient at visit today.        ROS: see HPI above    Current Outpatient Prescriptions   Medication Sig    tamsulosin (FLOMAX) 0.4 MG capsule Take 1 capsule (0.4 mg total) by mouth every evening    fluticasone (FLONASE) 50 MCG/ACT nasal spray 1 spray by Nasal route daily    traMADol (ULTRAM) 50 MG tablet Take 1 tablet (50 mg total) by mouth every 8 hours as needed (for head pain and abd pain)   Max daily dose: 150 mg    DULoxetine (CYMBALTA) 60 MG capsule TAKE 1 CAPSULE BY MOUTH EVERY DAY    clonazePAM (KLONOPIN) 0.5 MG tablet Take 1 tablet (0.5 mg total) by mouth daily as needed   Max daily dose: 0.5 mg    Insulin Disposable Pump (V-GO 30) KIT Use daily; 30ct/month.    dulaglutide (TRULICITY) 1.5 ZO/1.0RU injection pen Inject 0.5 mLs (1.5 mg total) into the skin every 7 days    lamoTRIgine (LAMICTAL) 150 MG tablet Take 1 tablet (150 mg total) by mouth daily    busPIRone (BUSPAR) 30 MG tablet TAKE 1 TABLET BY MOUTH TWO TIMES DAILY    mirtazapine (REMERON) 15 MG tablet Take 1 tablet (15 mg total) by mouth nightly    metoprolol (TOPROL-XL) 25 MG 24 hr tablet     pantoprazole (PROTONIX) 40 MG EC tablet Take 1 tablet (40 mg total) by mouth daily  SWALLOW WHOLE. DO NOT CRUSH, BREAK, OR CHEW.    topiramate (TOPAMAX) 100 MG tablet Take 1 tablet (100 mg total) by mouth 2 times daily    albuterol HFA 108 (90 BASE) MCG/ACT inhaler Inhale 1-2 puffs into the lungs every 6 hours as needed for Wheezing   Shake well before each use.    atorvastatin (LIPITOR) 40 MG tablet TAKE 1 TABLET BY MOUTH EVERY DAY WITH DINNER    insulin glargine 300 UNIT/ML SOPN Inject 30 Units into the skin nightly   FREE voucher will be used; DO NOT bill Insurance    dicyclomine (BENTYL) 20 MG tablet TAKE 1 TABLET (20 MG TOTAL) BY MOUTH 4 TIMES DAILY (BEFORE MEALS AND NIGHTLY)    ascorbic acid (VITAMIN C) 100 MG tablet Take 100 mg by mouth daily    midodrine (PROAMATINE)  10 MG tablet Take 1 tablet (10 mg total) by mouth 3 times daily    insulin syringe-needle U-100 (BD ULTRAFINE) 31G X 5/16" 0.3 ML HALF-UNIT Use 3 times a day as instructed.    promethazine (PHENERGAN) 12.5 MG tablet Take 1 tablet (12.5 mg total) by mouth 4 times daily as needed    SUMAtriptan (IMITREX) 50 MG tablet Take 1 tablet (50 mg total) by mouth as needed for Migraine   Take at onset of headache. May repeat once in 2 hours.    insulin lispro (HUMALOG) 100 UNIT/ML injection vial For VGO: Dinner 44W (6 clicks) ; B & L 4- 6 units. BG 100 - 150 8u (4clk), 151 - 200 10U (5clk), >201 12U ( 6 ck). MDD 72U (36 clk)    FREESTYLE LITE test strip Use four times daily as directed for 250.02    blood glucose monitor system Brand: cheapest brand available per her insurance.  Use as directed.    lancets Brand Free Style Lite; Use 2 times per day as directed for blood glucose testing.    Alcohol Swabs (ALCOHOL WIPES) PADS Use BID for BG check    famciclovir (FAMVIR) 500 MG tablet Take 1 tablet (500 mg total) by mouth 3 times daily as needed    cetirizine (ZYRTEC) 10 MG tablet Take 10 mg by mouth daily    levonorgestrel (MIRENA) 20 MCG/24HR IUD 1 each by Intrauterine route once    Non-System Medication The above patient is followed in our clinic and cannot resume work permanently.         OBJECTIVE  Vitals Reviewed:  BP 144/88 (BP Location: Left arm, Patient Position: Sitting)   Pulse (!) 112   Temp 36.8 C (98.2 F) (Temporal)    Ht 1.727 m (_0 )   Wt 128.5 kg (283 lb 3.2 oz)   BMI 43.06 kg/m2    Wt Readings from Last 3 Encounters:   09/17/16 128.5 kg (283 lb 3.2 oz)   09/13/16 129.5 kg (285 lb 6.4 oz)   09/09/16 129.3 kg (285 lb)        Hemoglobin A1C   Date Value Ref Range Status   05/21/2016 7.4 (H) 4.0 - 6.0 % Final     Comment:                                   ELEVATED   12/27/2015 7.3 (H) 4.0 - 6.0 % Final     Comment:  ELEVATED   09/22/2015 7.4 (H) 4.0 - 6.0 % Final      Comment:                                   ELEVATED   07/10/2015 9.0 (H) 4.0 - 6.0 % Final     Comment:                                   ELEVATED   06/05/2015 9.3 (H) 4.0 - 6.0 % Final     Comment:                                   ELEVATED          GENERAL: A&Ox3, pleasant, cooperative, well groomed, appropriately dressed, actively engaged in encounter, NAD.  CARDIOVASCULAR: RR, S1 S2, no murmers, gallops, rubs.  RESPIRATORY: RRR, unlabored. Breath sounds clear to auscultation bilaterally, no adventitious breath sounds, full and symmetrical lung expansion.  ABDOMEN: Abdomen obese, non-distended,soft, no palpable masses, BS hypoactive active x 4, positive difuse tenderness in all 4 quads, worse pain in lower quads-R again worse than L, pain over mildly distended bladder, no rebound, or guarding, + R CVAT   GU: normal external female genitalia.  scant discharge at vaginal introitus.  Normal null-parous cervix with strings in place, no bleeding or blood visible.  No masses in vaginal vault.  + CMT, no adnexal masses.  Normal rectal tone, no masses in rectal vault.      ASSESSMENT & PLAN    1. Lower abdominal pain  -continues to have abd pain, more diffuse today but worse in RLQ and over bladder  -will get [place] STAT to evaluate pelvis  -may take Tramadol for abd pain again per PCP  - POCT Urinalysis Dipstick-unable to void  - GYN ultrasound scan; Future    2. IUD mechanical complication  -Strings appear in place. Will get formal GYN [place] to confirm if malpositioned within endometrium/myometrium. If not, can leave in place for additional 19yr.     3. Urinary retention  -ongoing  -on tamsulosin 0.4 mg, continue current use  - tx pending  results  - [place]ore[place]al retroperitoneal complete; Future  - Basic metabolic panel; Future    4. Medication refill  -tramadol refilled today for pain management      F/u pending results     Health Maintenance    Health Maintenance   Topic Date Due    URINE MICROALBUMIN  09/10/2016     DEPRESSION SCREEN MONTHLY  10/10/2016    BREAST CANCER SCREENING  11/08/2016    HEMOGLOBIN A1C  11/18/2016    LIPID DISORDER SCREENING  12/26/2016    FOOT EXAM  05/13/2017    OPHTHALMOLOGY EXAM  01/21/2018    CERVICAL CANCER SCREEN PAP EVERY 5 YEARS  10/15/2018    HIV TESTING OFFERED  Addressed    [id number]  Completed       Patient verbalized understanding of information presented,and confirmed agreement with current plan of care.  Reviewed risks, benefits, and administration of prescribed/refilled medications. Precautions reviewed.    MyChart:  Activated [1]

## 2016-09-17 NOTE — Telephone Encounter (Signed)
Spoke with pt and advised her that her GYN [place] is normal with correct position of IUD. 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 She needs to go to the ED to be catheterized and evaluated by urology. She verbalized understanding and will be on her way to [facility]. [facility] ED notified by Probation officer as well. NG

## 2016-09-17 NOTE — ED Provider Notes (Signed)
History     Chief Complaint   Patient presents with    Urinary Retention    Abdominal Pain     HPI Comments: Patient has had low abdominal pain. She had a gyne [place] and her IUD is in the proper place. They noted a large post void and renal US was done. She had retention but no hydrnephrosis. She last urinated noon yesterday.   Unable to urinate with low abdominal pain.     She has fibromyalgia and migraines.       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 Lower abdomen with cellulitis changes, wound probed and purulent material expressed.  Had PICC line for "multiple infiltrations" of what? D/c-ed home on 10d of Augmentin 9/11.      Anginal pain     Anxiety     Arthritis     Asthma     Complication of anesthesia     Depression     Diabetes mellitus     Previously on SU and metformin, now diet controlled    Diverticulitis 09/2010    Dysfunctional uterine bleeding     Fibromyalgia     GERD (gastroesophageal reflux disease)     Hyperlipidemia     Migraine     Neuromuscular disorder     POTS (postural orthostatic tachycardia syndrome)     Sebaceous cyst of breast     right axilla    Varicella         Past Surgical History:   Procedure Laterality Date    APPENDECTOMY  2016    arthroscopic shoulder surgery Right 2010    Related to lifting    CARDIAC CATHETERIZATION  09/2011    negative    DILATION AND CURETTAGE OF UTERUS  2005, 2007    x2 for menorraghia    LEFT COLECTOMY  01/03/14    Dr Tresa Res    loop recorder  Feb 03 2014    PR COLONOSCOPY THRU COLOTOMY N/A 02/16/2016    Procedure: COLONOSCOPY;  Surgeon: Kelly Splinter, MD;  Location: [place];  Service: GI    SMALL INTESTINE SURGERY      TONSILLECTOMY       Family History   Problem Relation Age of Onset    Hypertension Father     Diabetes Father     Kidney Disease Father      Elevated lipids Father     Heart attack Father 45    Other Father      PVD    Hypertension Mother     Elevated lipids Mother     Heart attack Mother 39    Diabetes Mother     Eczema Mother     Psoriasis Mother     Hypertension Brother     Heart Disease Brother 61     prinzmetal's angina    Heart Disease Sister      currently having work up    [place] Sister     Hypertension Sister     Breast cancer Maternal Grandmother     Stroke Other        Social History    reports that she has quit smoking. Her smoking use included Cigarettes. She has a 1.50 pack-year smoking history. She has never used smokeless tobacco. She reports that she drinks alcohol.  She reports that she currently engages in sexual activity and has had female partners. She reports that she does not use illicit drugs.    Living Situation     Questions Responses    Patient lives with Significant Other    Comment: Lives with Wife, [name]     Homeless No    Caregiver for other family member No    External Services Mental Health Services    Comment: [place]     Employment Disabled    Domestic Violence Risk No          Problem List     Patient Active Problem List   Diagnosis Code    DM (diabetes mellitus), type 2, uncontrolled E11.65    Migraine with aura G43.109    Asthma J45.909    Hyperlipemia E78.5    Fibromyalgia M79.7    Syncope and collapse- recurrent, working diagnosis vascular vs psychogenic. 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   HENT: Negative.    Eyes: Negative.    Respiratory: Negative.    Cardiovascular: Negative.    Gastrointestinal: Positive for abdominal pain and nausea.   Endocrine: Negative.    Genitourinary: Positive for difficulty urinating.   Musculoskeletal: Negative.    Skin: Negative.    Allergic/Immunologic: Negative.    Neurological: Negative.    Hematological: Negative.    Psychiatric/Behavioral: Negative.        Physical Exam     ED Triage Vitals   BP Heart Rate Heart Rate (via Pulse Ox) Resp Temp Temp src SpO2 (Retired) O2 Device O2 Flow Rate   09/17/16 1415 09/17/16 1415 -- 09/17/16 1415 09/17/16 1415 09/17/16 1415 09/17/16 1415 -- --   141/103 114  18 37 C (98.6 F) TEMPORAL 98 %        Weight           09/17/16 1415           128.4 kg (283 lb)                    Physical Exam   Constitutional: She appears well-developed and well-nourished. No distress.   HENT:   Head: Normocephalic and atraumatic.   Right Ear: Tympanic membrane, external ear and ear canal normal.   Left Ear: Tympanic membrane, external ear and ear canal normal.   Nose: Nose normal.   Mouth/Throat: Oropharynx is clear and moist. No oropharyngeal exudate.   Eyes: Conjunctivae and EOM are normal. Pupils are equal, round, and reactive to light. Right eye exhibits no discharge. Left eye exhibits no discharge. No scleral icterus.   Neck: Normal range of motion. Neck supple. No JVD present. No tracheal deviation present. No thyromegaly present.  Cardiovascular: Normal rate, regular rhythm, normal heart sounds and intact distal pulses.    Pulmonary/Chest: Effort normal and breath sounds normal. No stridor.   Abdominal: Soft. She exhibits no distension and no mass. There is tenderness (low abdomen). There is no rebound and no guarding.   Musculoskeletal: Normal range of motion. She exhibits no  edema, tenderness or deformity.   Lymphadenopathy:     She has no cervical adenopathy.   Neurological: She is alert. No cranial nerve deficit.   Skin: Skin is warm and dry. No rash noted. She is not diaphoretic. No erythema. No pallor.   Psychiatric: She has a normal mood and affect. Her behavior is normal. Judgment and thought content normal.   Nursing note and vitals reviewed.      Medical Decision Making        Initial Evaluation:  ED First Provider Contact     Date/Time Event User Comments    09/17/16 1439 ED First Provider Contact [facility], Jesse Hirst R Initial Face to Face Provider Contact          Patient seen by me as above    Assessment:  41 y.o.female comes to the ED with low abdominal pain    Differential Diagnosis includes urinary retention v other pathology                      Plan: labs IV fluids Foley and reassessment  Sonyia Muro Jerrol Banana, MD    Patient had 750 ml residua. Labs OK OP management       Glennon Mac, MD  09/17/16 [phone]

## 2016-09-17 NOTE — Discharge Instructions (Signed)
Usual medications and activities    Vit C 1000 mg twice daily    Foley out with urology in one week     Follow with your MD    Return as needed

## 2016-09-17 NOTE — ED Triage Notes (Signed)
Urinary retention. Seen last week for same and was straight cathed. Went to PCP today had US done, shows blockages. States she last urinated yesterday afternoon. Endorses abdominal pain and distention.        Triage Note   Arna Medici, RN

## 2016-09-17 NOTE — ED Notes (Signed)
09/17/16 1416   Expected Call-In Information   PCP/Service Referral [facility]   Pt Info note/Reason for sending Needs catheter and urology consult. Renal and Gyn imaging done. Notes in erecord.   Call [facility]

## 2016-09-17 NOTE — Telephone Encounter (Signed)
[facility] / PRACTICE CALL NOTE    Date and Time of Call: 09/17/2016 06:45    PCP:  [name], MD     Problem: Pt calling b/c she is having "severe abdominal pain".  Lower abdomen and right lower pelvis.  Urinating a little better than she was last week.  Last week she went to [facility] ED b/c of urinary retention (she has since been started on Flomax which is working well for her) and she was dx'ed with mesenteric adenitis.  Pt is s/p appendectomy.  Pt reports that PCP told her at her visit Friday 5/11 that one of IUD arms may be embedded into uterus a bit.  Has not had fever/chills/vomiting.  Has had some loose stools. Denies bright red blood per rectum.     Impression and Plan: Lower abdominal and RLQ pain  With recent dx of mesenteric adenitis and possibly malpositioned IUD.     Patient does wish to be seen today.  Will have her see Verita Lamb, NP at our office 8:20 AM.  Pt has pelvic U/S scheduled for this Thursday 5/17.      Message sent to staff to schedule pt, to PCP and Sherrye Payor as FYI.       The patient is instructed to call back if patient is having worsening symptoms prior to her appointment in 1 1/2 hours, and patient expressed understanding.    Jennelle Human, MD

## 2016-09-17 NOTE — Addendum Note (Signed)
Addended by: Lorayne Marek on: 09/17/2016 11:17 AM     Modules accepted: Orders

## 2016-09-17 NOTE — Telephone Encounter (Signed)
Provider aware. NG

## 2016-09-17 NOTE — Patient Instructions (Signed)
GYN ULTRASOUND  [address]  Appointment: 09/17/16-11:00  [phone]    US renal retroperitoneal complete   [place]  [address] [place] the right  Appointment: 09/17/16-2:15  Nothing to eat 6hrs   Please drink  16-24 ounces of water 1 hour prior to the exam.

## 2016-09-18 ENCOUNTER — Encounter: Payer: Self-pay | Admitting: Primary Care

## 2016-09-18 ENCOUNTER — Emergency Department
Admission: EM | Admit: 2016-09-18 | Discharge: 2016-09-19 | Disposition: A | Discharge status: Home / Self Care | Payer: Medicare (Managed Care) | Source: Ambulatory Visit | Attending: Emergency Medicine | Admitting: Emergency Medicine

## 2016-09-18 ENCOUNTER — Telehealth: Payer: Self-pay

## 2016-09-18 ENCOUNTER — Emergency Department: Payer: Medicare (Managed Care)

## 2016-09-18 ENCOUNTER — Encounter: Payer: Self-pay | Admitting: Emergency Medicine

## 2016-09-18 ENCOUNTER — Ambulatory Visit: Payer: Medicare (Managed Care) | Admitting: Registered Nurse

## 2016-09-18 DIAGNOSIS — E119 Type 2 diabetes mellitus without complications: Secondary | ICD-10-CM | POA: Insufficient documentation

## 2016-09-18 DIAGNOSIS — R310 Gross hematuria: Secondary | ICD-10-CM | POA: Insufficient documentation

## 2016-09-18 DIAGNOSIS — Z960 Presence of urogenital implants: Secondary | ICD-10-CM | POA: Insufficient documentation

## 2016-09-18 DIAGNOSIS — R3989 Other symptoms and signs involving the genitourinary system: Secondary | ICD-10-CM

## 2016-09-18 DIAGNOSIS — R319 Hematuria, unspecified: Secondary | ICD-10-CM

## 2016-09-18 DIAGNOSIS — Z978 Presence of other specified devices: Secondary | ICD-10-CM

## 2016-09-18 DIAGNOSIS — R339 Retention of urine, unspecified: Secondary | ICD-10-CM

## 2016-09-18 DIAGNOSIS — R109 Unspecified abdominal pain: Secondary | ICD-10-CM

## 2016-09-18 LAB — CBC AND DIFFERENTIAL
Baso # K/uL: 0.1 10*3/uL (ref 0.0–0.1)
Basophil %: 0.5 %
Eos # K/uL: 0.2 10*3/uL (ref 0.0–0.4)
Eosinophil %: 1.3 %
Hematocrit: 37 % (ref 34–45)
Hemoglobin: 11.9 g/dL (ref 11.2–15.7)
IMM Granulocytes #: 0.1 10*3/uL (ref 0.0–0.1)
IMM Granulocytes: 0.7 %
Lymph # K/uL: 2.9 10*3/uL (ref 1.2–3.7)
Lymphocyte %: 24.3 %
MCH: 28 pg (ref 26–32)
MCHC: 33 g/dL (ref 32–36)
MCV: 85 fL (ref 79–95)
Mono # K/uL: 0.6 10*3/uL (ref 0.2–0.9)
Monocyte %: 4.7 %
Neut # K/uL: 8.2 10*3/uL — ABNORMAL HIGH (ref 1.6–6.1)
Nucl RBC # K/uL: 0 10*3/uL (ref 0.0–0.0)
Nucl RBC %: 0 /100 WBC (ref 0.0–0.2)
Platelets: 339 10*3/uL (ref 160–370)
RBC: 4.3 MIL/uL (ref 3.9–5.2)
RDW: 13.6 % (ref 11.7–14.4)
Seg Neut %: 68.5 %
WBC: 12 10*3/uL — ABNORMAL HIGH (ref 4.0–10.0)

## 2016-09-18 LAB — BASIC METABOLIC PANEL
Anion Gap: 13 (ref 7–16)
CO2: 23 mmol/L (ref 20–28)
Calcium: 9 mg/dL (ref 8.8–10.2)
Chloride: 101 mmol/L (ref 96–108)
Creatinine: 0.73 mg/dL (ref 0.51–0.95)
GFR,Black: 117 *
GFR,Caucasian: 102 *
Glucose: 267 mg/dL — ABNORMAL HIGH (ref 60–99)
Lab: 12 mg/dL (ref 6–20)
Potassium: 3.7 mmol/L (ref 3.3–5.1)
Sodium: 137 mmol/L (ref 133–145)

## 2016-09-18 MED ORDER — ONDANSETRON HCL 2 MG/ML IV SOLN *I*
4.0000 mg | Freq: Once | INTRAMUSCULAR | Status: AC
Start: 2016-09-19 — End: 2016-09-19
  Administered 2016-09-19: 4 mg via INTRAVENOUS
  Filled 2016-09-18: qty 2

## 2016-09-18 MED ORDER — ACETAMINOPHEN 325 MG PO TABS *I*
650.0000 mg | ORAL_TABLET | Freq: Once | ORAL | Status: AC
Start: 2016-09-19 — End: 2016-09-19
  Administered 2016-09-19: 650 mg via ORAL
  Filled 2016-09-18: qty 2

## 2016-09-18 NOTE — ED Notes (Signed)
ED RN INTERN ATTESTATION       I Kaylee Hawking, RN (RN) reviewed the following charting information by the RN intern:    Nursing Assessments  Medications  Plan of Care  Teaching   Notes    In the chart of Kaylee Harris (40 y.o. female) and attest to the charting being accurate.

## 2016-09-18 NOTE — Telephone Encounter (Signed)
Left message for pt to call back  °

## 2016-09-18 NOTE — ED Triage Notes (Signed)
Pt had a foley placed yesterday in ED for   Urinary retention. Decreased Urine output and gross hematuria with clots.        Triage Note   Sherilyn Dacosta, RN

## 2016-09-18 NOTE — Telephone Encounter (Signed)
Spoke with patient.  She has been scheduled with Dr. Sharrell Ku on Wednesday 09/25/16 at 9:15 am at the [facility].

## 2016-09-18 NOTE — ED Notes (Signed)
Pt from U/S.

## 2016-09-18 NOTE — ED Provider Notes (Signed)
History     Chief Complaint   Patient presents with    Urinary Catheter Problem    Hematuria     HPI Comments: Kaylee Harris is a 41 y.o. female with PMH significant for DM, GERD, HTN, HL, Fibromyalgia, POTS who presented to the ED with the CC of hematuria. Was seen in ED for urinary retention yesterday and had foley placed. Has Urology FU next week. Today developed hematuria and right flank pain. No fevers or chills. No CP or SOB. Urine continues to flow. No hx of kidney stone.         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 Lower abdomen with cellulitis changes, wound probed and purulent material expressed.  Had PICC line for "multiple infiltrations" of what? D/c-ed home on 10d of Augmentin 9/11.      Anginal pain     Anxiety     Arthritis     Asthma     Complication of anesthesia     Depression     Diabetes mellitus     Previously on SU and metformin, now diet controlled    Diverticulitis 09/2010    Dysfunctional uterine bleeding     Fibromyalgia     GERD (gastroesophageal reflux disease)     Hyperlipidemia     Migraine     Neuromuscular disorder     POTS (postural orthostatic tachycardia syndrome)     Sebaceous cyst of breast     right axilla    Varicella         Past Surgical History:   Procedure Laterality Date    APPENDECTOMY  2016    arthroscopic shoulder surgery Right 2010    Related to lifting    CARDIAC CATHETERIZATION  09/2011    negative    COLON SURGERY      DILATION AND CURETTAGE OF UTERUS  2005, 2007    x2 for menorraghia    LEFT COLECTOMY  01/03/14    Dr Tresa Res    loop recorder  Feb 03 2014    PR COLONOSCOPY THRU COLOTOMY N/A 02/16/2016    Procedure: COLONOSCOPY;  Surgeon: Kelly Splinter, MD;  Location: [place];  Service: GI    SMALL INTESTINE SURGERY      TONSILLECTOMY       Family History   Problem Relation Age  of Onset    Hypertension Father     Diabetes Father     Kidney Disease Father     Elevated lipids Father     Heart attack Father 36    Other Father      PVD    Hypertension Mother     Elevated lipids Mother     Heart attack Mother 46    Diabetes Mother     Eczema Mother     Psoriasis Mother     Hypertension Brother     Heart Disease Brother 20     prinzmetal's angina    Heart Disease Sister      currently having work up    [place] Sister     Hypertension Sister     Breast cancer Maternal Grandmother     Stroke Other        Social History    reports that she has quit smoking. Her smoking use included Cigarettes. She has  a 1.50 pack-year smoking history. She has never used smokeless tobacco. She reports that she drinks alcohol. She reports that she currently engages in sexual activity and has had female partners. She reports that she does not use illicit drugs.    Living Situation     Questions Responses    Patient lives with Significant Other    Comment: Lives with Wife, [name]     Homeless No    Caregiver for other family member No    External Services Mental Health Services    Comment: [place]     Employment Disabled    Domestic Violence Risk No          Problem List     Patient Active Problem List   Diagnosis Code    DM (diabetes mellitus), type 2, uncontrolled E11.65    Migraine with aura G43.109    Asthma J45.909    Hyperlipemia E78.5    Fibromyalgia M79.7    Syncope and collapse- recurrent, working diagnosis vascular vs psychogenic. 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   HENT: Negative for congestion and sore throat.    Eyes: Negative for visual disturbance.   Respiratory: Negative for cough, shortness of breath and wheezing.    Cardiovascular: Negative for chest pain and palpitations.   Gastrointestinal: Negative for abdominal pain, diarrhea, nausea and vomiting.   Genitourinary: Positive for difficulty urinating, flank pain and hematuria. Negative for dysuria, urgency, vaginal bleeding and vaginal discharge.   Musculoskeletal: Negative for back pain and neck pain.   Skin: Negative for rash.   Neurological: Negative for dizziness, seizures, weakness and headaches.   Psychiatric/Behavioral: Negative for confusion, dysphoric mood and suicidal ideas.       Physical Exam     ED Triage Vitals   BP Heart Rate Heart Rate (via Pulse Ox) Resp Temp Temp src SpO2 (Retired) O2 Device O2 Flow Rate   09/18/16 2142 09/18/16 2142 -- 09/18/16 2142 09/18/16 2142 09/18/16 2142 09/18/16 2142 -- --   171/93 105  14 37 C (98.6 F) TEMPORAL 100 %        Weight           09/18/16 2142           129.3 kg (285 lb)                    Physical Exam   Constitutional: She is oriented to person, place, and time. She appears well-developed and well-nourished. No distress.   HENT:   Head: Normocephalic and atraumatic.   Eyes: EOM are normal. Pupils are equal, round, and reactive to light.   Neck: Normal range of motion. No JVD present.   Cardiovascular: Normal rate and  regular rhythm.    Pulmonary/Chest: Effort normal and breath sounds normal. She has no wheezes. She has no rales.   Abdominal: Soft. Bowel sounds are normal. There is no tenderness.   Genitourinary:   Genitourinary Comments: Foley in place.  Blood-tinged urine in bag.  No obvious clots.   Musculoskeletal: Normal range  of motion. She exhibits no edema.   Neurological: She is alert and oriented to person, place, and time. No cranial nerve deficit.   Skin: Skin is warm and dry.   Psychiatric: She has a normal mood and affect.   Nursing note and vitals reviewed.      Medical Decision Making      Amount and/or Complexity of Data Reviewed  Clinical lab tests: ordered and reviewed  Tests in the radiology section of CPT: ordered and reviewed  Decide to obtain previous medical records or to obtain history from someone other than the patient: yes  Review and summarize past medical records: yes  Independent visualization of images, tracings, or specimens: yes        Initial Evaluation:  ED First Provider Contact     Date/Time Event User Comments    09/18/16 2221 ED First Provider Contact Zunaira Lamy Initial Face to Face Provider Contact          Patient seen by me as above    Assessment:  41 y.o.female comes to the ED with urinary retention status post Foley placement yesterday presents with hematuria and right flank pain.  Patient is well-appearing in no apparent distress.    Differential Diagnosis includes traumatic bleeding from Foley, kidney stones, hemorrhagic cystitis                      Plan:   Orders Placed This Encounter   Procedures    Urinalysis reflex to culture    US renal retroperitoneal complete    CBC and differential    Basic metabolic panel    Urinalysis with reflex to Microscopic UA and reflex to Bacterial Culture    Bladder scan    Nursing action    Insert peripheral IV       Ludwig Lean, MD           Ludwig Lean, MD  09/18/16 [phone]

## 2016-09-18 NOTE — Telephone Encounter (Signed)
Kaylee Harris needs a 1 week fuv to have foley cath removed. Please call her at [phone].

## 2016-09-18 NOTE — ED Notes (Signed)
Pt to US.

## 2016-09-18 NOTE — Telephone Encounter (Signed)
Kaylee Harris was discharged from [facility] on 09/17/16 for UTI no follow up appointment is needed in our office. Patient states she has no increase pain but her urin is a very dark yellow and will like to know if that's normal. Patient also states she feels like her bladder is full all the time but does see urin in the bag. Patient states she drinks about six bottles of water a day, and she is not schedule to see urology as of yet but will schedule a appointment today with them.

## 2016-09-18 NOTE — Telephone Encounter (Signed)
Yes, urine can be dark.  It has been tested for any other problem like infection or blood, and it has been negative.  The sensation she is feeling is more likely the catheter.    [name], MD  [facility]  09/18/2016  1:47 PM

## 2016-09-18 NOTE — Telephone Encounter (Signed)
Spoke with pt and message relayed. Understanding expressed.

## 2016-09-18 NOTE — ED Notes (Signed)
Pt c/o of urine being dark brown when she woke up this morning. Around 3pm she noted a clot, and since then urine has progressively become more bloody. Pt also c/o of left sided abdominal pain , and lower back pain, rating pain 6/10. States pain is making her nauseous. Denies any v/d or fevers. Foley was placed yesterday in ED d/t urinary retention.

## 2016-09-19 ENCOUNTER — Ambulatory Visit: Payer: Medicare (Managed Care) | Admitting: Pulmonary and Critical Care Medicine

## 2016-09-19 ENCOUNTER — Telehealth: Payer: Self-pay | Admitting: Urology

## 2016-09-19 ENCOUNTER — Other Ambulatory Visit: Payer: Self-pay

## 2016-09-19 ENCOUNTER — Telehealth: Payer: Self-pay | Admitting: Primary Care

## 2016-09-19 ENCOUNTER — Other Ambulatory Visit: Payer: Medicare (Managed Care)

## 2016-09-19 LAB — URINALYSIS REFLEX TO CULTURE
Ketones, UA: NEGATIVE
Nitrite,UA: NEGATIVE
Protein,UA: 75 mg/dL — AB
Specific Gravity,UA: 1.01 (ref 1.001–1.030)
pH,UA: 7 (ref 5.0–8.0)

## 2016-09-19 LAB — URINE MICROSCOPIC (IQ200): RBC,UA: >50 /hpf — AB (ref 0–2)

## 2016-09-19 MED ORDER — DULAGLUTIDE 1.5 MG/0.5ML SC SOAJ *I*
1.5000 mg | SUBCUTANEOUS | 5 refills | Status: DC
Start: 2016-09-19 — End: 2017-08-19

## 2016-09-19 NOTE — ED Provider Progress Notes (Signed)
ED Provider Progress Note    Labs Reviewed   URINALYSIS REFLEX TO CULTURE - Abnormal; Notable for the following:        Result Value    Color, UA Lt Yellow (*)     Appearance,UR 1+ Cloudy (*)     Leuk Esterase,UA TRACE (*)     Protein,UA 75 (*)     Blood,UA 3+ (*)     All other components within normal limits   CBC AND DIFFERENTIAL - Abnormal; Notable for the following:     WBC 12.0 (*)     Neut # K/uL 8.2 (*)     All other components within normal limits   BASIC METABOLIC PANEL - Abnormal; Notable for the following:     Glucose 267 (*)     All other components within normal limits   URINE MICROSCOPIC (IQ200) - Abnormal; Notable for the following:     RBC,UA >50 (*)     Bacteria,UA 2+ (*)     All other components within normal limits   AEROBIC CULTURE   UA WITH REFLEX TO CULTURE     No signs of infection at this time    US renal retroperitoneal complete   Final Result      No hydronephrosis or sonographic evidence of calculi      END OF IMPRESSION         I have personally reviewed the image(s) and the resident's interpretation and agree with or edited the findings.      [facility] submits this DICOM format image data and final report to the [facility], an independent secure electronic health information exchange, on a reciprocally searchable basis (with patient authorization) for a minimum of 12 months after exam    date.           Will FU with urology       Ludwig Lean, MD, 09/19/2016, 2:38 AM         Ludwig Lean, MD  09/19/16 [phone]

## 2016-09-19 NOTE — ED Notes (Signed)
ED RN INTERN ATTESTATION       I [name], RN (RN) reviewed the following charting information by the RN intern:    Nursing Assessments  Medications  Plan of Care  Teaching   Notes    In the chart of Kaylee Harris (40 y.o. female) and attest to the charting being accurate.

## 2016-09-19 NOTE — Telephone Encounter (Signed)
I spoke to [facility]. She took a message and sent it to the doctor to see if they can get her in acutely. They will call the patient directly to schedule her.

## 2016-09-19 NOTE — Telephone Encounter (Signed)
Mary,    Please see Dr. Tawanna Sat request below.    Thanks  [employer]

## 2016-09-19 NOTE — Telephone Encounter (Signed)
Can we call urology office and see if she can be seen sooner than next week? She has appt 5/23. Can she be seen today or tomorrow? Pt now having gross bloody urine in addition to acute urinary retention. Has been to the ED 3x. Please ask for cystoscopy    [name], MD  [facility]  09/19/2016  9:00 AM

## 2016-09-19 NOTE — Telephone Encounter (Signed)
Kaylee Harris has a scheduled appointment on 5/23 with Dr. Sharrell Ku for a NPV. The patient's PCP Dr. Laurance Flatten would like a sooner appointment if possible to be seen for worsening hematuria, urine retention. Patient has been to ED 3 times, including twice this month to [facility]. Dr. Laurance Flatten is requesting a cystoscopy. Patient can be reached at [phone].

## 2016-09-19 NOTE — Telephone Encounter (Signed)
FYI  New patient appointment scheduled at the [facility] office next week.   Dr. [name] is requesting patient have a cystoscopy due to hematuria.     [place] Staff,   Can the NPV 09/25/2016 appointment be scheduled as a cystoscopy?    Spoke with Bethena Roys.   Currently does not have a foley catheter.   Urinating okay right now. Still having hematuria, bladder pain, urgency.   No medications prescribed. No fluid restrictions.  No blood thinners. Has IBS usually has diarrhea.  Advised to stay well hydrated. Urine culture is pending.   Aware she has the earliest appointment with Dr. Sharrell Ku scheduled next week.       09/18/2016 11:40 PM  US RENAL RETROPERITONEAL COMPLETE    ORDERING CLINICAL INFORMATION:   Right Flank Pain, Hematuria, Retention ;   ADDITIONAL CLINICAL INFORMATION:  Hematuria.    COMPARISON:  Renal ultrasound 09/17/2016     TECHNIQUE:  Sonographic evaluation of the kidneys and bladder was performed using grayscale ultrasound.    FINDINGS:    Right Kidney:  11.8 cm in length.  Details:  No Hydronephrosis or sonographic evidence of calculi    Left Kidney:  12.6 cm in length.    Details:  No Hydronephrosis or sonographic evidence of calculi    Aorta:  Visualized portions are unremarkable.    Proximal Iliacs:  The proximal iliac vessels are poorly evaluated secondary to overlying bowel gas.    IVC Superior:  The IVC is poorly evaluated secondary to bowel gas.    Bladder:  A Foley catheter is present.    IMPRESSION:    No hydronephrosis or sonographic evidence of calculi    END OF IMPRESSION

## 2016-09-19 NOTE — ED Notes (Signed)
Switched pt foley leg bag to large collection bag.

## 2016-09-19 NOTE — Discharge Instructions (Signed)
Department tests were unremarkable.  Urine does show blood but no signs of infection.  No signs of kidney stone or hydronephrosis on the ultrasound. The blood is likely secondary to traumatic Foley placement and should resolve in time.  Please follow up with urology as previously scheduled    Please review any accompanying literature given to you at discharge. These forms provide important information regarding your diagnosis.     Please take any prescriptions as prescribed until finished or otherwise instructed by your doctor.    Continue to take your previously prescribed medications as prescribed unless otherwise instructed to do so.     Should you experience any new or worsening symptoms please don't hesitate to return to the ED for further evaluation.

## 2016-09-20 LAB — AEROBIC CULTURE: Aerobic Culture: 0

## 2016-09-20 NOTE — Telephone Encounter (Addendum)
Called and spoke with patient, informed that provider would like to meet and assess her first, then will determine if procedure is needed. Patient states understanding. Patient scheduled 5/23 for NPV with Dr. Sharrell Ku. Thanks [place].

## 2016-09-20 NOTE — Telephone Encounter (Signed)
Requesting a cystoscopy is a bit aggressive without meeting the patient first. We will interview  And evaluate her and determine the need for cystoscopy.

## 2016-09-21 ENCOUNTER — Other Ambulatory Visit: Payer: Self-pay | Admitting: Primary Care

## 2016-09-21 DIAGNOSIS — E119 Type 2 diabetes mellitus without complications: Secondary | ICD-10-CM

## 2016-09-21 DIAGNOSIS — IMO0002 Reserved for concepts with insufficient information to code with codable children: Secondary | ICD-10-CM

## 2016-09-22 ENCOUNTER — Encounter: Payer: Self-pay | Admitting: Primary Care

## 2016-09-25 ENCOUNTER — Encounter: Payer: Self-pay | Admitting: Urology

## 2016-09-25 ENCOUNTER — Ambulatory Visit: Payer: Medicare (Managed Care) | Attending: Emergency Medicine | Admitting: Urology

## 2016-09-25 ENCOUNTER — Telehealth: Payer: Self-pay | Admitting: Urology

## 2016-09-25 VITALS — BP 121/66 | HR 90 | Ht 68.0 in | Wt 285.0 lb

## 2016-09-25 DIAGNOSIS — R310 Gross hematuria: Secondary | ICD-10-CM

## 2016-09-25 LAB — POCT URINALYSIS DIPSTICK
Glucose,UA POCT: NORMAL mg/dL
Ketones,UA POCT: NEGATIVE mg/dL
Leuk Esterase,UA POCT: NEGATIVE
Lot #: 22262702
Nitrite,UA POCT: NEGATIVE
PH,UA POCT: 5 (ref 5–8)
Protein,UA POCT: NEGATIVE mg/dL
Specific gravity,UA POCT: 1.005 (ref 1.002–1.030)

## 2016-09-25 LAB — POCT BLADDER SCAN PVR: Residual mL: 0

## 2016-09-25 MED ORDER — V-GO 30 KIT
PACK | 11 refills | Status: DC
Start: 2016-09-25 — End: 2017-04-23

## 2016-09-25 MED ORDER — INSULIN LISPRO (HUMAN) 100 UNIT/ML IJ/SC SOLN *WRAPPED*
SUBCUTANEOUS | 0 refills | Status: DC
Start: 2016-09-25 — End: 2016-10-25

## 2016-09-25 NOTE — Progress Notes (Signed)
PRE-PROCEDURE TIME OUT    What procedure is being performed? cystoscopy  Correct procedure? yes  Correct Patient (use 2 identifiers)? yes  Correct Site? yes  Site Marked? N/A  Correct Side? N/A  Correct patient position? yes  Consent verified? yes  Tests results available?Yes  Imaging results available? Yes  List participants involved in time out. Dr. Ed Blalock, Keagen Heinlen RN  Availability of correct instruments and any special equipment or requirements? yes

## 2016-09-25 NOTE — Telephone Encounter (Signed)
Patient called to state she is on her way but may about about 10 minutes late.    Patient can be reached at [phone].

## 2016-09-25 NOTE — Progress Notes (Addendum)
Bladder irrigated via foley catheter, one very small clot evacuated and the rest was clear urine and sterile water.  Foley removed for cystoscopy    Bladder scan done after cystoscopy and patient void. Friend was taught how to straight cath the patient, should she go back into retention.    Surgery pending

## 2016-09-25 NOTE — Progress Notes (Signed)
[facility] Urology New Patient Visit     Referring provider : Glennon Mac    Chief complaint: gross hematuria, urinary retention     History of present illness: Kaylee Harris is a 41 y.o. female who is here for a new patient evaluation of above cc. The patient had painful onset of acute urinary retention with hematuria, no stones, some dysuria, no frequency or urgency no history of UTI. A foley was placed patient was discharged history of smoking     Medications:   Current Outpatient Prescriptions   Medication    dulaglutide (TRULICITY) 1.5 NU/2.7OZ injection pen    traMADol (ULTRAM) 50 MG tablet    fluticasone (FLONASE) 50 MCG/ACT nasal spray    DULoxetine (CYMBALTA) 60 MG capsule    clonazePAM (KLONOPIN) 0.5 MG tablet    Insulin Disposable Pump (V-GO 30) KIT    lamoTRIgine (LAMICTAL) 150 MG tablet    busPIRone (BUSPAR) 30 MG tablet    mirtazapine (REMERON) 15 MG tablet    metoprolol (TOPROL-XL) 25 MG 24 hr tablet    pantoprazole (PROTONIX) 40 MG EC tablet    topiramate (TOPAMAX) 100 MG tablet    albuterol HFA 108 (90 BASE) MCG/ACT inhaler    atorvastatin (LIPITOR) 40 MG tablet    insulin glargine 300 UNIT/ML SOPN    ascorbic acid (VITAMIN C) 100 MG tablet    midodrine (PROAMATINE) 10 MG tablet    insulin syringe-needle U-100 (BD ULTRAFINE) 31G X 5/16" 0.3 ML HALF-UNIT    promethazine (PHENERGAN) 12.5 MG tablet    SUMAtriptan (IMITREX) 50 MG tablet    insulin lispro (HUMALOG) 100 UNIT/ML injection vial    blood glucose monitor system    lancets    Alcohol Swabs (ALCOHOL WIPES) PADS    famciclovir (FAMVIR) 500 MG tablet    cetirizine (ZYRTEC) 10 MG tablet    levonorgestrel (MIRENA) 20 MCG/24HR IUD    tamsulosin (FLOMAX) 0.4 MG capsule    dicyclomine (BENTYL) 20 MG tablet    FREESTYLE LITE test strip    Non-System Medication     No current facility-administered medications for this visit.        Allergies:   Allergies   Allergen Reactions    Morphine Anaphylaxis    Toradol [Ketorolac  Tromethamine] Anaphylaxis     Notes feeling tightness in her throat and full body itching after last dose a month ago. Also notes erythema tracking along her vein after injection. This is not reflected by documentation.  Tolerates Naproxen.    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 Lower abdomen with cellulitis changes, wound probed and purulent material expressed.  Had PICC line for "multiple infiltrations" of what? D/c-ed home on 10d of Augmentin 9/11.      Anginal pain     Anxiety     Arthritis     Asthma     Bleeding 3664    Complication of anesthesia     Depression     Diabetes mellitus     Previously on SU and metformin, now diet controlled    Diverticulitis 09/2010  Dysfunctional uterine bleeding     Fibromyalgia     GERD (gastroesophageal reflux disease)     Hyperlipidemia     Migraine     Neuromuscular disorder     POTS (postural orthostatic tachycardia syndrome)     Sebaceous cyst of breast     right axilla    Varicella        Past surgical history:   Past Surgical History:   Procedure Laterality Date    APPENDECTOMY  2016    arthroscopic shoulder surgery Right 2010    Related to lifting    CARDIAC CATHETERIZATION  09/2011    negative    COLON SURGERY      DILATION AND CURETTAGE OF UTERUS  2005, 2007    x2 for menorraghia    LEFT COLECTOMY  01/03/14    Dr Tresa Res    loop recorder  Feb 03 2014    PR COLONOSCOPY THRU COLOTOMY N/A 02/16/2016    Procedure: COLONOSCOPY;  Surgeon: Kelly Splinter, MD;  Location: [place];  Service: GI    SMALL INTESTINE SURGERY      TONSILLECTOMY         Family history:   Family History   Problem Relation Age of Onset     Hypertension Father     Diabetes Father     Kidney Disease Father     Elevated lipids Father     Heart attack Father 55    Other Father      PVD    Heart Disease Father     Hypertension Mother     Elevated lipids Mother     Heart attack Mother 62    Diabetes Mother     Eczema Mother     Psoriasis Mother     Heart Disease Mother     Hypertension Brother     Heart Disease Brother 22     prinzmetal's angina    Heart Disease Sister      currently having work up    [place] Sister     Hypertension Sister     Breast cancer Maternal Grandmother     Stroke Other     Cancer Sister     Thyroid disease Sister        Social History:   Social History     Social History    Marital status: Married     Spouse name: N/A    Number of children: N/A    Years of education: N/A     Occupational History    Not on file.     Social History Main Topics    Smoking status: Former Smoker     Packs/day: 0.50     Years: 3.00     Types: Cigarettes    Smokeless tobacco: Never Used    Alcohol use 0.0 oz/week     0 Standard drinks or equivalent per week      Comment: <1 weekly    Drug use: No    Sexual activity: Yes     Partners: Female     Patent examiner protection: IUD     Social History Narrative    ** Merged History Encounter **         Married to [place], recently relocated back to [place] from [place] due to family stressors. 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          REVIEW of SYSTEMS  Constitutional: Negative for f/c.  HEENT: Negative no blurry vision has double vision and ear nose and throat infections with sinus issues  Respiratory: has wheezing, and sob no cough  Cardiac: has chest pain and HTN  GI: has abdominal pain n/v and indigestion with GERD  GU: See above.  Musculoskeletal: has neck back and joint pain   Neurological: Negative no tremors does have dizzy spells and numbness no gait changes   Hematological: Negative  Behavorial: Negative  Skin:  Negative no rashes boils or itching   Endocrine: has increased thirst  Intolerance of temperature   Vascular:Negative    Vital signs: Blood pressure 121/66, pulse 90, height 1.727 m ('5\' 8"' ), weight 129.3 kg (285 lb).    Urine analysis shows :   Recent Results (from the past 24 hour(s))   POCT urinalysis dipstick    Collection Time: 09/25/16 11:00 AM   Result Value Ref Range    Specific gravity,UA POCT 1.005 1.002 - 1.030    PH,UA POCT 5.0 5 - 8    Leuk Esterase,UA POCT Negative Negative    Nitrite,UA POCT Negative Negative    Protein,UA POCT Negative Negative mg/dL    Glucose,UA POCT Normal Normal mg/dL    Ketones,UA POCT Negative Negative mg/dL    Urobilinogen,UA  Less than 1 mg/dL    Bilirubin,Ur  Negative    Blood,UA POCT About 250 (!) Negative    Exp date 03/2017     Lot # [id number]    POCT Bladder Scan PVR    Collection Time: 09/25/16 11:33 AM   Result Value Ref Range    Residual mL 0          Physical examination:  GENERAL: NAD  HEENT: Normocephalic, atraumatic.  eomi eye glasses in place Oropharynx clear.  Trachea midline  NEURO: Oriented to person, place, time, and situation cn grossly intact normal gait   PSYCHIATRIC: Normal mood and affect  SKIN: Normal color, turgor, hydration no rashes   RESPIRATORY: Normal respiratory effort.  Lungs clear to auscultation bilaterally without crackles or wheezes  CARDIAC: RRR  BACK/THORAX: No costovertebral angle tenderness.  No axial skeletal tenderness.  No obvious back deformities  ABDOMEN: +BS, non peritoneal obese soft, nontender, nondistended, and without masses  EXTREMITIES: No cyanosis, clubbing, or edema.  Calves nontender.      Post Voiding Residual: 0 ml after cystoscopy     Radiologic evaluation included: US Renal Retroperitoneal Complete    Result Date: 09/19/2016  09/18/2016 11:40 PM US RENAL RETROPERITONEAL COMPLETE ORDERING CLINICAL INFORMATION:   Right Flank Pain, Hematuria, Retention ; ADDITIONAL CLINICAL INFORMATION:  Hematuria. COMPARISON:  Renal  ultrasound 09/17/2016 TECHNIQUE:  Sonographic evaluation of the kidneys and bladder was performed using grayscale ultrasound. FINDINGS: Right Kidney:  11.8 cm in length. Details:  No Hydronephrosis or sonographic evidence of calculi Left Kidney:  12.6 cm in length.  Details:  No Hydronephrosis or sonographic evidence of calculi Aorta:  Visualized portions are unremarkable. Proximal Iliacs:  The proximal iliac vessels are poorly evaluated secondary to overlying bowel gas. IVC Superior:  The IVC is poorly evaluated secondary to bowel gas. Bladder:  A Foley catheter is present.     No hydronephrosis or sonographic evidence of calculi END OF IMPRESSION I have personally reviewed the image(s) and the resident's interpretation and agree with or edited the findings. [facility] submits this DICOM format image data and final report to the [facility], an independent secure electronic health information exchange, on a reciprocally searchable basis (  with patient authorization) for a minimum of 12 months after exam date.     US Renal Retroperitoneal Complete    Result Date: 09/17/2016  09/17/2016 12:04 PM US RENAL RETROPERITONEAL COMPLETE ORDERING CLINICAL INFORMATION:   Urinary Retention, Obstruction? ; R33.9-Retention Of Urine, Unspecified ADDITIONAL CLINICAL INFORMATION: COMPARISON:  None. TECHNIQUE:  Sonographic evaluation of the kidneys and bladder was performed using grayscale ultrasound. FINDINGS: Right Kidney:  11.8 cm in length. Details:  No stones. No Hydronephrosis Left Kidney:  12.6 cm in length.  Details:  No stones. No Hydronephrosis Aorta:  Visualized portions unremarkable Proximal Iliacs:  Bifurcation unremarkable IVC Superior:  Visualized portions unremarkable Bladder:  Approximately 492 cc volume prevoid. Approximately 454 cc volume postvoid. Unremarkable appearance. Bilateral ureteral jets visualized.     1. Significant postvoid residual in the bladder. 2. No evidence of hydronephrosis. END OF IMPRESSION [facility] submits this DICOM format image data and final report to the [facility], an independent secure electronic health information exchange, on a reciprocally searchable basis (with patient authorization) for a minimum of 12 months after exam date.        Assessment: urinary retention gross hematuria    Plan:  Cystoscopy  See note below  To OR for biopsy      PROCEDURE NOTE    After the patient was consented she was prepped and draped in a sterile fashion. The scope was inserted into the urethra which was normal. The bladder had erythema and edema due to foley trauma. There were two area with suspicious growth no bleeding, and well away from foley trauma area. No stones, the UOs were in their usual anatomic position. Patient told of the findings and given smoking history suggested going to OR for biopsy. Patient understood an agreed.   Foley removed and patient voided with above pvr  Learned to self cath    Ed Blalock, MD 09/25/2016 2:46 PM

## 2016-09-25 NOTE — Patient Instructions (Signed)
Cystoscopy Discharge Instructions    About this topic   Your kidneys make urine. It is stored in your bladder. The urethra is a tube at the bottom of the bladder. Urine flows out of this tube. Sometimes, there is a blockage and urine is not able to leave the body.  A cystoscopy is a procedure that lets the doctor see the inside of your bladder and urethra. The doctor does it to:  · Look for stones or tumors blocking the bladder and urethra  · Look for changes or injury inside the bladder  · Take a tissue sample from the inside of your bladder  · Look for reasons for blood in the urine, infections, or why you are passing urine often  · Look for prostate problems  What care is needed at home?   · Ask your doctor what you need to do when you go home. Make sure you ask questions if you do not understand what the doctor says. This way you will know what you need to do.  · Take a warm bath or use a warm wet washcloth over the opening to the urethra. This will help to ease any pain. Do this as needed.  · Drink 6 to 8 glasses of water a day.  · You may see some blood in your urine for a few days. This is normal.  · Empty your bladder as soon as you feel the need to. Don't delay going to the bathroom. It stretches and weakens the bladder.  What follow-up care is needed?   · Your doctor may ask you to make visits to the office to check on your progress. Be sure to keep these visits.  · If you had a biopsy, talk with your doctor about the results.  What drugs may be needed?   The doctor may order drugs to:  · Help with pain  · Fight an infection  Will physical activity be limited?   Talk to your doctor about when you may go back to your normal activities like work, driving, or sex.   What problems could happen?   · Bleeding  · Infection  · Injury to the bladder and urethra  · Discomfort in the urethra area  · Burning sensation for a short time  · Upset stomach  When do I need to call the doctor?   · Signs of infection.  These include a fever of 100.4°F (38°C) or higher, chills, pain with passing urine.  · Pain does not go away even with drugs  · Too much blood in your urine  · Cloudy urine  · Little or no urine or not able to pass urine  Teach Back: Helping You Understand   The Teach Back Method helps you understand the information we are giving you. The idea is simple. After talking with the staff, tell them in your own words what you were just told. This helps to make sure the staff has covered each thing clearly. It also helps to explain things that may have been a bit confusing. Before going home, make sure you are able to do these:  · I can tell you about my procedure.  · I can tell you what may help ease my pain.  · I can tell you what I will do if I have a fever, chills, or am not able to pass urine.  Where can I learn more?   American Urological Association Foundation  http://www.urologyhealth.org/urology/index.cfm?article=77   National Kidney   and Urologic Diseases Information Clearinghouse  http://kidney.niddk.nih.gov/kudiseases/pubs/cystoscopy/index.aspx   NHS Choices  http://www.nhs.uk/conditions/Cystoscopy/Pages/Introduction.aspx   Last Reviewed Date   2013-04-07  Consumer Information Use and Disclaimer   This information is not specific medical advice and does not replace information you receive from your health care provider. This is only a brief summary of general information. It does NOT include all information about conditions, illnesses, injuries, tests, procedures, treatments, therapies, discharge instructions or life-style choices that may apply to you. You must talk with your health care provider for complete information about your health and treatment options. This information should not be used to decide whether or not to accept your health care provider’s advice, instructions or recommendations. Only your health care provider has the knowledge and training to provide advice that is right for you.  Copyright     Copyright © 2015 [employer] and its affiliates and/or licensors. All rights reserved.

## 2016-09-27 ENCOUNTER — Other Ambulatory Visit: Payer: Self-pay

## 2016-09-27 ENCOUNTER — Encounter: Payer: Self-pay | Admitting: Urology

## 2016-09-27 DIAGNOSIS — Z760 Encounter for issue of repeat prescription: Secondary | ICD-10-CM

## 2016-09-27 LAB — MEDICAL CYTOLOGY

## 2016-09-27 MED ORDER — TRAMADOL HCL 50 MG PO TABS *I*
50.0000 mg | ORAL_TABLET | Freq: Three times a day (TID) | ORAL | 0 refills | Status: DC | PRN
Start: 2016-09-27 — End: 2016-11-07

## 2016-09-27 NOTE — Telephone Encounter (Signed)
Patient Name: Kaylee Harris Birth Date: 02-18-1976   Address: [place], [place] [address] Sex: Female   Rx Written Rx Dispensed Drug Quantity Days Supply Prescriber Name Payment Method Dispenser   01/15/2016 01/15/2016 tramadol hcl 50 mg tablet  8 2 [place], [place]. #02   Others' Prescriptions  Patient Name: [name] Birth Date: 11/01/1975   Address: [place], [place] [address] Sex: Female   Rx Written Rx Dispensed Drug Quantity Days Supply Prescriber Name Payment Method Dispenser   09/17/2016 09/17/2016 tramadol hcl 50 mg tablet  42 14 [place], [place]. #02   08/13/2016 08/19/2016 clonazepam 0.5 mg tablet  30 30 Corigliano, [name] D NP [employer] #02   06/21/2016 06/21/2016 diphenoxylate-atropine 2.5-0.025 mg tablet  240 30 [place] Nab A ([place]) [address] [place]. #02   06/04/2016 06/06/2016 tramadol hcl 50 mg tablet  51 14 [name], Darnell Level (MD) [place]. #02   02/29/2016 03/03/2016 tramadol hcl 50 mg tablet  81 14 [name], Darnell Level (MD) [place]. #02   01/19/2016 01/19/2016 tramadol hcl 50 mg tablet  92 14 [name], Darnell Level (MD) [place]. #02   12/05/2015 12/05/2015 diphenoxylate-atropine 2.5-0.025 mg tablet  56 14 [place] [place] #02   10/20/2015 10/20/2015 tramadol hcl 50 mg tablet  5 2 [place], [place]. #02   * - Drugs marked with an asterisk are compound drugs. If the compound drug is made up of more than one controlled substance, then each controlled substance will be a separate row in the table.

## 2016-09-27 NOTE — Telephone Encounter (Signed)
Please complete I-stop and forward to the doctor.

## 2016-10-01 ENCOUNTER — Encounter: Payer: Self-pay | Admitting: Primary Care

## 2016-10-01 ENCOUNTER — Telehealth: Payer: Self-pay | Admitting: Urology

## 2016-10-01 NOTE — Telephone Encounter (Signed)
Spoke with patient, scheduled procedure for tentative date of 6/11 (awaiting ok from [facility] to add case on - advised patient that procedure maybe scheduled on 6/27 as I am still awaiting ok to schedule on 6/11). Patient stated understanding - also went over presurgical instructions on the phone and advised I would send to address on file. Patient had no further questions.

## 2016-10-01 NOTE — Telephone Encounter (Signed)
Patient is calling to schedule biopsy. She can be reached at [phone]

## 2016-10-04 ENCOUNTER — Ambulatory Visit
Admission: RE | Admit: 2016-10-04 | Discharge: 2016-10-04 | Disposition: A | Discharge status: Home / Self Care | Payer: Medicare (Managed Care) | Source: Ambulatory Visit | Attending: Urology | Admitting: Urology

## 2016-10-04 ENCOUNTER — Telehealth: Payer: Self-pay | Admitting: Endocrinology

## 2016-10-04 DIAGNOSIS — K219 Gastro-esophageal reflux disease without esophagitis: Secondary | ICD-10-CM | POA: Insufficient documentation

## 2016-10-04 DIAGNOSIS — K5792 Diverticulitis of intestine, part unspecified, without perforation or abscess without bleeding: Secondary | ICD-10-CM

## 2016-10-04 DIAGNOSIS — R29898 Other symptoms and signs involving the musculoskeletal system: Secondary | ICD-10-CM

## 2016-10-04 DIAGNOSIS — Z794 Long term (current) use of insulin: Secondary | ICD-10-CM

## 2016-10-04 DIAGNOSIS — Z01818 Encounter for other preprocedural examination: Secondary | ICD-10-CM | POA: Insufficient documentation

## 2016-10-04 HISTORY — DX: Gastro-esophageal reflux disease without esophagitis: K21.9

## 2016-10-04 HISTORY — DX: Long term (current) use of insulin: Z79.4

## 2016-10-04 HISTORY — DX: Other symptoms and signs involving the musculoskeletal system: R29.898

## 2016-10-04 LAB — BASIC METABOLIC PANEL
Anion Gap: 13 (ref 7–16)
CO2: 22 mmol/L (ref 20–28)
Calcium: 9.3 mg/dL (ref 8.8–10.2)
Chloride: 103 mmol/L (ref 96–108)
Creatinine: 0.79 mg/dL (ref 0.51–0.95)
GFR,Black: 106 *
GFR,Caucasian: 92 *
Glucose: 182 mg/dL — ABNORMAL HIGH (ref 60–99)
Lab: 9 mg/dL (ref 6–20)
Potassium: 4.4 mmol/L (ref 3.3–5.1)
Sodium: 138 mmol/L (ref 133–145)

## 2016-10-04 NOTE — Anesthesia Preprocedure Evaluation (Addendum)
Anesthesia Pre-operative History and Physical for Kaylee Harris  History and Physical Performed at [facility] ([place])  Highlighted Issues for this Procedure:  41 y.o. female with Gross hematuria (R31.0) presenting for Procedure(s):  CYSTOSCOPY BLADDER TUMOR BIOPSY by Surgeon(s):  Ed Blalock, MD scheduled for 75 minutes.  Cardiol Dr. Kerin Ransom last seen 4/17 (note in EPIC) but phone communications have occurred a couple of times since [facility]).  Endocrinol RN CDE Nordquist 06/19/16 (note in EPIC).  PreSurgical Nurse notified Anesth Dr. Suzzanne Cloud and Nurse in [place]  I have a complicated diabetic patient who is on the new V Go topical And also regular insulin. She uses back up glargine if she needs to remove the Vgo.   Her diabetes resource nurse instructed her to   - use glargine 30 units which she does not use in conjuction with the vgo but they would like her to removed remove the Vgo the night before and take the glargine. Patient will hold humalog as usual on the morning of surgery. Plan was confirmed with patient by phone 4 Jun 18  Will Bonnet, MD      Electrophysiology/AICD/Pacer:  EKG 07/12/16 in EPIC.  Neg Stress Test 1/17 in EPIC.    [facility] Summary:  Kaylee Harris presents preoperatively for anesthesia evaluation prior to Cystoscopy/ Bladder Tumor Biopsy.    Also History of:    DM (diabetes mellitus), type 2, uncontrolled (E11.65)    Migraine with aura (G43.109)    Asthma (J45.909)    Hyperlipemia (E78.5)    Fibromyalgia (M79.7)    Syncope and collapse- recurrent, working diagnosis vascular vs psychogenic. 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)    Carpal tunnel syndrome (G56.00)    Meralgia paresthetica of left side (G57.12)    No diabetic retinopathy in both eyes (Z03.89)    At risk for abuse of opiates  (Z91.89)    History of repeated overdose (Z91.5)    Borderline personality disorder (F60.3)    Fatty liver disease, nonalcoholic (W29.9)    GERD (gastroesophageal reflux disease) (B71.6)    TMJ click, left (R67.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)    Anxiety (F41.9)    Bipolar 1 disorder (F31.9)    POTS (postural orthostatic tachycardia syndrome) (R00.0, I95.1)    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 Also notes erythema tracking along her vein after injection. This is not reflected by documentation.Tolerates Naproxen.  -- Trazodone - Anaphylaxis  -- Seasonal Allergies - Itching  --  Derma Bond(skin glue) pruritis/redness and c/o respiratory distress.    Patient Vitals in the past 72 hrs:  10/04/16 1441, BP:123/79, Pulse:81, SpO2:98 %, Height:1.702 m (5\' 7" ), Weight:127 kg (280 lb), Body mass index  is 43.85 kg/(m^2).By Docia Chuck, PA at 3:38 PM on 10/04/2016    Anesthesia Evaluation Information Source: patient, records     ANESTHESIA HISTORY  Pertinent(-):  No History of anesthetic complications or Family hx of anesthetic complications    GENERAL    + Obesity  Comment: Denies recent fevers/ rashes  Pertinent (-):  No substance abuse    HEENT    + TMJD (click sometimes)  Pertinent (-):  No neck pain PULMONARY    + Smoker          tobacco former    + Asthma          mild intermittent  Pertinent(-):  No sleep apnea or COPD    CARDIOVASCULAR  Good(4+METs)  Exercise Tolerance  Pertinent(-):  No hypertension, past MI, angina, CAD, valvular heart disease, anticoagulants/antiplatelet medications, dysrhythmias, vascular Issues or hx of DVT    GI/HEPATIC/RENAL  Last PO Intake: >8hr before procedure and >2hr before procedure (clears)    + GERD    + Nausea    + Liver Disease (fatty)    + Urinary Issues  Pertinent(-):  No PUD, alcohol use, pancreatic issues or renal issues NEURO/PSYCH    + Headaches          migraines    + Syncope (POTS (daily))    + Chronic pain          fibromyalgia    + Psychiatric Issues          anxiety, depression, bipolar    + Neuromuscular disease    + Gait/Mobility issues (for (pre-)syncopal episodes)          walker  Pertinent(-):  No seizures or cerebrovascular event    ENDO/OTHER    + Diabetes Mellitus (V-Go insulin delivery device adhesed to abdominal wall daily which delivers basal insulin daily as well as SS TID)          Type 2 using insulin  Pertinent(-):  No thyroid disease, menstruating (IUD)    HEMATOLOGIC    + Blood dyscrasia          hyperlipidemia    + Arthritis  Pertinent(-):  No bruising/bleeding easily, coagulopathy, anticoagulants/antiplatelet medications or blood transfusion       Physical Exam    Airway            Mouth opening: normal            Mallampati: II            TM distance (fb): >3 FB            Neck ROM: full     Cardiovascular           Rhythm: regular           Rate: normal  No murmur         Pulmonary     breath sounds clear to auscultation    Mental Status     oriented to person, place and time       ________________________________________________________________________  PLAN  ASA Score  3  Anesthetic Plan general     Induction (routine IV); Airway (LMA); Line ( use current access); Monitoring (standard ASA); Positioning (lithotomy); PONV Plan (dexamethasone and ondansetron); Pain (per surgical team); PostOp (PACU)    Informed Consent     Risks:          Risks discussed were commensurate with the plan listed above  with the following specific points: N/V and sore throat, unexpected  serious injury    Anesthetic Consent:         Anesthetic plan (and risks as noted above) were discussed with patient, spouse and sibling    Attending Attestation:  As the primary attending anesthesiologist, I attest that the patient or proxy understands and accepts the risks and benefits of the anesthesia plan. I also attest that I have personally performed a pre-anesthetic examination and evaluation, and prescribed the anesthetic plan for this particular location within 48 hours prior to the anesthetic as documented. Will Bonnet, MD 12:25 PM

## 2016-10-04 NOTE — Telephone Encounter (Signed)
Patient is scheduled for 6/11 procedure date.

## 2016-10-04 NOTE — Telephone Encounter (Signed)
Pt called about transitioning from VGo delivery system (contains Humalog) to lantus injection for upcoming surgery.  Advised to remove VGo 2 hours before bedtime the night before surgery and take recommended lantus injection at bedtime.  Pt aware she will need to use Humalog pen injections for any bolus coverage while VGo removed.  Confirmed advice with endo NP. Eartha Inch, RN

## 2016-10-04 NOTE — Discharge Instructions (Addendum)
Pre-Operative Instructions                            PRIOR TO SURGERY    Five days before surgery, please STOP taking if surgeon does not specify:     Anti-inflammatory medications (Ibuprofen, Motrin, Advil, Mobic, Meloxicam, Aleve, Naproxen, Voltaren, etc.)   Vitamins and herbal supplements, including herbal teas    YOU MAY TAKE ACETAMINOPHEN (TYLENOL) as needed    FOLLOW YOUR SURGEON'S INSTRUCTIONS IF DIFFERENT THAN ABOVE.    DAY BEFORE SURGERY     Call [phone] between 2PM and 4PM to receive your arrival/surgery time.     Call Friday afternoon if your surgery is on a Monday.      Please arrange for transportation to and from the hospital. You must have a responsible adult to stay with you after your surgery.      Do not eat anything (including candy or gum) after midnight the night before your surgery.                                                                                             DAY OF SURGERY     You are encouraged to drink clear liquids up to 2 hours prior to your scheduled surgery time.     Examples include: Gatorade, clear apple or cranberry juice, water, soda , black coffee or tea.      Please bring photo identification and your insurance card with you for registration.      If you are female, please be prepared to provide a urine sample on arrival to the preoperative area unless you are one year post menopause or have had a hysterectomy.       DO NOT WEAR: JEWELRY, MAKEUP, DARK NAIL POLISH, HAIR PINS, BODY LOTION OR SCENTS.        Please understand that rings and body piercings must be removed prior to surgery.      If they are not removed, your surgery is at risk of being delayed or cancelled.  Please see a jeweler before your surgery if you cannot remove an item yourself.      If wearing eyeglasses, please bring a case.  DO NOT WEAR CONTACT LENSES   .   You may shower, brush your teeth, and use deodorant.      PLEASE BRING YOUR CPAP MACHINE INTO THE  PREOPERATIVE AREA TO BE CHECKED.       MEDICATIONS: DAY OF SURGERY     Take your medications with a sip of water as directed on the medication list.   Anxiety and pain medications may be taken as prescribed at any time prior to arrival.    Bring your inhalers and eye drops on the day of surgery to be used as prescribed.   All other medications will be administered to you from [facility] under the direction of your surgeon.    DIABETICS: ON THE DAY OF SURGERY     Do not take oral diabetes medications.   DO NOT TAKE ANY REGULAR OR HUMALOG INSULIN (THIS  INSTRUCTION DOES NOT APPLY TO  INSULIN PUMPS).    Take half the dose of NPH insulin.   Take Lantus or Levemir insulin as usual.   If you are diabetic and feeling symptomatic, you may take sugar containing clear liquids after midnight the night before your surgery if your glucose level is less than 70.  Please limit your intake of these clear liquids to 8 ounces if at all possible.   If you are still feeling symptomatic, you may report to the [facility] early and report your symptoms to anesthesia personnel.    AT [place] in the Main Ramp garage.  Enter the building through the [employer].        Please stop at the Information Desk so that they may direct you to Surgery Center on [address].       [place]    As a convenience, prior to your discharge we will provide any discharge medications you require.      The pharmacy is open from 9am to 5:30pm on weekdays and 10am-2pm on Saturdays.     If you will be alone on the day of surgery and want to leave with your prescribed medication, you may:     Bring a check made out to [facility]   Use a credit card to pay for your prescription   Have your family call the pharmacy with a credit card number   Only bring cash to the hospital as a last resort. Prescriptions cannot be filled without payment. Thank you for your  consideration.    QUESTIONS?     Question about these instructions? Call [phone], select option 2, and leave a message for a nurse to return your call.   Any questions regarding specifics about your surgery or recovery? Please call your surgeons office.

## 2016-10-05 ENCOUNTER — Other Ambulatory Visit: Payer: Self-pay | Admitting: Primary Care

## 2016-10-07 ENCOUNTER — Ambulatory Visit: Payer: Medicare (Managed Care) | Attending: Psychiatry

## 2016-10-07 DIAGNOSIS — F603 Borderline personality disorder: Secondary | ICD-10-CM

## 2016-10-07 DIAGNOSIS — F3341 Major depressive disorder, recurrent, in partial remission: Secondary | ICD-10-CM | POA: Insufficient documentation

## 2016-10-07 DIAGNOSIS — F422 Mixed obsessional thoughts and acts: Secondary | ICD-10-CM | POA: Insufficient documentation

## 2016-10-07 MED ORDER — FAMCICLOVIR 500 MG PO TABS *I*
500.0000 mg | ORAL_TABLET | Freq: Three times a day (TID) | ORAL | 5 refills | Status: DC | PRN
Start: 2016-10-07 — End: 2017-11-28
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
## 2016-10-09 ENCOUNTER — Other Ambulatory Visit: Payer: Medicare (Managed Care)

## 2016-10-09 NOTE — Telephone Encounter (Signed)
Spoke with patient, advised there has been a change in Dr. Albesa Seen OR schedule and procedure has to be moved to 6/14 instead of 6/11. Patient stated understanding and confirmed ok.

## 2016-10-15 ENCOUNTER — Other Ambulatory Visit: Payer: Self-pay | Admitting: Psychiatry

## 2016-10-15 MED ORDER — MIRTAZAPINE 15 MG PO TABS *I*
15.0000 mg | ORAL_TABLET | Freq: Every evening | ORAL | 2 refills | Status: DC
Start: 2016-10-15 — End: 2017-01-07

## 2016-10-15 MED ORDER — DULOXETINE HCL 60 MG PO CPEP *I*
60.0000 mg | DELAYED_RELEASE_CAPSULE | Freq: Every day | ORAL | 2 refills | Status: DC
Start: 2016-10-15 — End: 2016-11-04

## 2016-10-15 MED ORDER — BUSPIRONE HCL 30 MG PO TABS *I*
30.0000 mg | ORAL_TABLET | Freq: Two times a day (BID) | ORAL | 2 refills | Status: DC
Start: 2016-10-15 — End: 2017-01-07

## 2016-10-17 ENCOUNTER — Encounter: Payer: Self-pay | Admitting: Urology

## 2016-10-17 ENCOUNTER — Encounter: Payer: Self-pay | Admitting: Primary Care

## 2016-10-17 ENCOUNTER — Encounter
Admission: RE | Disposition: A | Discharge status: Home / Self Care | Payer: Self-pay | Source: Ambulatory Visit | Attending: Urology

## 2016-10-17 ENCOUNTER — Ambulatory Visit: Payer: Medicare (Managed Care) | Admitting: Physician Assistant

## 2016-10-17 ENCOUNTER — Ambulatory Visit
Admission: RE | Admit: 2016-10-17 | Discharge: 2016-10-17 | Disposition: A | Discharge status: Home / Self Care | Payer: Medicare (Managed Care) | Source: Ambulatory Visit | Attending: Urology | Admitting: Urology

## 2016-10-17 ENCOUNTER — Ambulatory Visit: Payer: Medicare (Managed Care) | Admitting: Anesthesiology

## 2016-10-17 DIAGNOSIS — Z794 Long term (current) use of insulin: Secondary | ICD-10-CM | POA: Insufficient documentation

## 2016-10-17 DIAGNOSIS — Z6841 Body Mass Index (BMI) 40.0 and over, adult: Secondary | ICD-10-CM

## 2016-10-17 DIAGNOSIS — R339 Retention of urine, unspecified: Secondary | ICD-10-CM | POA: Insufficient documentation

## 2016-10-17 DIAGNOSIS — R310 Gross hematuria: Secondary | ICD-10-CM

## 2016-10-17 DIAGNOSIS — R319 Hematuria, unspecified: Secondary | ICD-10-CM | POA: Diagnosis present

## 2016-10-17 DIAGNOSIS — E119 Type 2 diabetes mellitus without complications: Secondary | ICD-10-CM | POA: Insufficient documentation

## 2016-10-17 HISTORY — PX: PR CYSTOURETHROSCOPY WITH BIOPSY: 52204

## 2016-10-17 LAB — POCT GLUCOSE
Glucose POCT: 228 mg/dL — ABNORMAL HIGH (ref 60–99)
Glucose POCT: 202 mg/dL — ABNORMAL HIGH (ref 60–99)

## 2016-10-17 LAB — POCT URINE PREGNANCY: Lot #: 7040040

## 2016-10-17 SURGERY — CYSTOSCOPY, WITH BLADDER NEOPLASM BIOPSY
Anesthesia: General | Site: Abdomen | Wound class: Clean

## 2016-10-17 MED ORDER — PROMETHAZINE HCL 25 MG/ML IJ SOLN *I*
12.5000 mg | INTRAMUSCULAR | Status: DC | PRN
Start: 2016-10-17 — End: 2016-10-18

## 2016-10-17 MED ORDER — PROMETHAZINE HCL 25 MG/ML IJ SOLN *I*
6.2500 mg | Freq: Once | INTRAMUSCULAR | Status: AC | PRN
Start: 2016-10-17 — End: 2016-10-17
  Administered 2016-10-17: 6.25 mg via INTRAVENOUS
  Filled 2016-10-17: qty 1

## 2016-10-17 MED ORDER — HYDROCODONE-ACETAMINOPHEN 5-325 MG PO TABS *I*
1.0000 | ORAL_TABLET | ORAL | Status: DC | PRN
Start: 2016-10-17 — End: 2016-10-18

## 2016-10-17 MED ORDER — OXYBUTYNIN CHLORIDE 5 MG PO TABS *I*
5.0000 mg | ORAL_TABLET | Freq: Three times a day (TID) | ORAL | Status: DC | PRN
Start: 2016-10-17 — End: 2016-10-18
  Administered 2016-10-17: 5 mg via ORAL
  Filled 2016-10-17: qty 1

## 2016-10-17 MED ORDER — LACTATED RINGERS IV SOLN *I*
20.0000 mL/h | INTRAVENOUS | Status: DC
Start: 2016-10-17 — End: 2016-10-17
  Administered 2016-10-17: 20 mL/h via INTRAVENOUS

## 2016-10-17 MED ORDER — DEXAMETHASONE SODIUM PHOSPHATE 4 MG/ML INJ SOLN *WRAPPED*
INTRAMUSCULAR | Status: DC | PRN
Start: 2016-10-17 — End: 2016-10-17
  Administered 2016-10-17: 4 mg via INTRAVENOUS

## 2016-10-17 MED ORDER — FENTANYL CITRATE 50 MCG/ML IJ SOLN *WRAPPED*
INTRAMUSCULAR | Status: AC
Start: 2016-10-17 — End: 2016-10-17
  Filled 2016-10-17: qty 2

## 2016-10-17 MED ORDER — PHENAZOPYRIDINE HCL 100 MG PO TABS *I*
100.0000 mg | ORAL_TABLET | Freq: Three times a day (TID) | ORAL | 0 refills | Status: DC | PRN
Start: 2016-10-17 — End: 2017-10-20

## 2016-10-17 MED ORDER — ONDANSETRON HCL 2 MG/ML IV SOLN *I*
INTRAMUSCULAR | Status: DC | PRN
Start: 2016-10-17 — End: 2016-10-17
  Administered 2016-10-17: 4 mg via INTRAVENOUS

## 2016-10-17 MED ORDER — GLUCAGON HCL (RDNA) 1 MG IJ SOLR *WRAPPED*
1.0000 mg | Freq: Once | INTRAMUSCULAR | Status: DC | PRN
Start: 2016-10-17 — End: 2016-10-17

## 2016-10-17 MED ORDER — LACTATED RINGERS IV SOLN *I*
125.0000 mL/h | INTRAVENOUS | Status: DC
Start: 2016-10-17 — End: 2016-10-17
  Administered 2016-10-17 (×2): 125 mL/h via INTRAVENOUS

## 2016-10-17 MED ORDER — DEXAMETHASONE SODIUM PHOSPHATE 4 MG/ML INJ SOLN *WRAPPED*
INTRAMUSCULAR | Status: AC
Start: 2016-10-17 — End: 2016-10-17
  Filled 2016-10-17: qty 1

## 2016-10-17 MED ORDER — LIDOCAINE HCL 1 % IJ SOLN *I*
0.1000 mL | INTRAMUSCULAR | Status: DC | PRN
Start: 2016-10-17 — End: 2016-10-17
  Administered 2016-10-17: 0.1 mL via SUBCUTANEOUS
  Filled 2016-10-17: qty 2

## 2016-10-17 MED ORDER — DEXTROSE 50 % IV SOLN *I*
12.5000 g | INTRAVENOUS | Status: DC | PRN
Start: 2016-10-17 — End: 2016-10-17

## 2016-10-17 MED ORDER — ROCURONIUM BROMIDE 10 MG/ML IV SOLN *WRAPPED*
Status: AC
Start: 2016-10-17 — End: 2016-10-17
  Filled 2016-10-17: qty 5

## 2016-10-17 MED ORDER — LIDOCAINE HCL 2 % (PF) IJ SOLN *I*
INTRAMUSCULAR | Status: AC
Start: 2016-10-17 — End: 2016-10-17
  Filled 2016-10-17: qty 5

## 2016-10-17 MED ORDER — MIDAZOLAM HCL 1 MG/ML IJ SOLN *I* WRAPPED
INTRAMUSCULAR | Status: DC | PRN
Start: 2016-10-17 — End: 2016-10-17
  Administered 2016-10-17: 2 mg via INTRAVENOUS

## 2016-10-17 MED ORDER — PROPOFOL 10 MG/ML IV EMUL (INTERMITTENT DOSING) WRAPPED *I*
INTRAVENOUS | Status: AC
Start: 2016-10-17 — End: 2016-10-17
  Filled 2016-10-17: qty 20

## 2016-10-17 MED ORDER — ACETAMINOPHEN 500 MG PO TABS *I*
ORAL_TABLET | ORAL | Status: AC
Start: 2016-10-17 — End: 2016-10-17
  Administered 2016-10-17: 1000 mg via ORAL
  Filled 2016-10-17: qty 2

## 2016-10-17 MED ORDER — PROPOFOL 10 MG/ML IV EMUL (INTERMITTENT DOSING) WRAPPED *I*
INTRAVENOUS | Status: DC | PRN
Start: 2016-10-17 — End: 2016-10-17
  Administered 2016-10-17: 120 mg via INTRAVENOUS

## 2016-10-17 MED ORDER — LIDOCAINE HCL 2 % IJ SOLN *I*
INTRAMUSCULAR | Status: DC | PRN
Start: 2016-10-17 — End: 2016-10-17
  Administered 2016-10-17: 100 mg via INTRAVENOUS

## 2016-10-17 MED ORDER — MIDAZOLAM HCL 1 MG/ML IJ SOLN *I* WRAPPED
INTRAMUSCULAR | Status: AC
Start: 2016-10-17 — End: 2016-10-17
  Filled 2016-10-17: qty 2

## 2016-10-17 MED ORDER — INSULIN LISPRO (HUMAN) 100 UNIT/ML IJ/SC SOLN *WRAPPED*
SUBCUTANEOUS | Status: AC
Start: 2016-10-17 — End: 2016-10-17
  Administered 2016-10-17: 12 [IU] via SUBCUTANEOUS
  Filled 2016-10-17: qty 3

## 2016-10-17 MED ORDER — ACETAMINOPHEN 500 MG PO TABS *I*
1000.0000 mg | ORAL_TABLET | Freq: Once | ORAL | Status: AC
Start: 2016-10-17 — End: 2016-10-17

## 2016-10-17 MED ORDER — INSULIN LISPRO (HUMAN) 100 UNIT/ML IJ/SC SOLN *WRAPPED*
12.0000 [IU] | Freq: Three times a day (TID) | SUBCUTANEOUS | Status: DC
Start: 2016-10-17 — End: 2016-10-17

## 2016-10-17 MED ORDER — ONDANSETRON HCL 2 MG/ML IV SOLN *I*
INTRAMUSCULAR | Status: AC
Start: 2016-10-17 — End: 2016-10-17
  Filled 2016-10-17: qty 2

## 2016-10-17 MED ORDER — HYDROCODONE-ACETAMINOPHEN 5-325 MG PO TABS *I*
2.0000 | ORAL_TABLET | ORAL | Status: DC | PRN
Start: 2016-10-17 — End: 2016-10-18
  Administered 2016-10-17: 2 via ORAL
  Filled 2016-10-17: qty 2

## 2016-10-17 MED ORDER — MEPERIDINE HCL 25 MG/ML IJ SOLN *I*
12.5000 mg | INTRAMUSCULAR | Status: DC | PRN
Start: 2016-10-17 — End: 2016-10-17
  Filled 2016-10-17: qty 1

## 2016-10-17 MED ORDER — GLUCAGON HCL (RDNA) 1 MG IJ SOLR *WRAPPED*
1.0000 mg | Freq: Once | INTRAMUSCULAR | Status: DC | PRN
Start: 2016-10-17 — End: 2016-10-18

## 2016-10-17 MED ORDER — SODIUM CHLORIDE 0.9 % IV SOLN WRAPPED *I*
20.0000 mL/h | Status: DC
Start: 2016-10-17 — End: 2016-10-17

## 2016-10-17 MED ORDER — CIPROFLOXACIN HCL 500 MG PO TABS *I*
500.0000 mg | ORAL_TABLET | Freq: Two times a day (BID) | ORAL | 0 refills | Status: AC
Start: 2016-10-17 — End: 2016-10-20

## 2016-10-17 MED ORDER — DEXTROSE 50 % IV SOLN *I*
12.5000 g | INTRAVENOUS | Status: DC | PRN
Start: 2016-10-17 — End: 2016-10-18

## 2016-10-17 MED ORDER — PHENAZOPYRIDINE HCL 100 MG PO TABS *I*
100.0000 mg | ORAL_TABLET | Freq: Two times a day (BID) | ORAL | Status: DC | PRN
Start: 2016-10-17 — End: 2016-10-18
  Administered 2016-10-17: 100 mg via ORAL
  Filled 2016-10-17: qty 1

## 2016-10-17 MED ORDER — FENTANYL CITRATE 50 MCG/ML IJ SOLN *WRAPPED*
INTRAMUSCULAR | Status: DC | PRN
Start: 2016-10-17 — End: 2016-10-17
  Administered 2016-10-17: 100 ug via INTRAVENOUS

## 2016-10-17 MED ORDER — CIPROFLOXACIN IN D5W 400 MG/200ML IV SOLN *I*
400.0000 mg | INTRAVENOUS | Status: AC
Start: 2016-10-17 — End: 2016-10-17
  Administered 2016-10-17: 400 mg via INTRAVENOUS
  Filled 2016-10-17: qty 200

## 2016-10-17 MED ORDER — KETOROLAC TROMETHAMINE 30 MG/ML IJ SOLN *I*
INTRAMUSCULAR | Status: AC
Start: 2016-10-17 — End: 2016-10-17
  Filled 2016-10-17: qty 1

## 2016-10-17 SURGICAL SUPPLY — 11 items
BAG DRAIN URINARY CATCH FOR BLK RING USE 244999 (Supply) ×2 IMPLANT
FILTER NEPTUNE 4PORT MANIFOLD (Supply) ×2 IMPLANT
GLOVE SURG PROTEXIS PI 6.0 PF SYN (Glove) ×8 IMPLANT
GLOVE SURG PROTEXIS PI 6.5 PF SYN (Glove) ×4 IMPLANT
GOWN PACK XL (Gown) ×1
PACK CUSTOM SURGICAL TURP (Pack) ×1 IMPLANT
PACK CUSTOM TURP (Pack) ×1
PACK GOWN SURGICAL XL (Gown) ×1 IMPLANT
SLEEVE COMP KNEE HI MED (Supply) ×2 IMPLANT
SOL GLYCINE IRRIG 1.5PCT 3000ML BAG (Solution) ×2 IMPLANT
SOL SODIUM CHLORIDE IRRIG 1000ML BTL (Solution) ×2 IMPLANT

## 2016-10-17 NOTE — INTERIM OP NOTE (Signed)
Interim Op Note (Surgical Log ID: [id number])       Date of Surgery: 10/17/2016       Surgeons: Surgeon(s) and Role:     * Ed Blalock, MD - Primary       Pre-op Diagnosis: Pre-Op Diagnosis Codes:     * Gross hematuria [R31.0]       Post-op Diagnosis: Post-Op Diagnosis Codes:     * Gross hematuria [R31.0]       Procedure(s) Performed: cystoscopy       Additional CPT Codes:        Anesthesia Type: General        Fluid Totals: I/O this shift:  06/14 0700 - 06/14 1459  In: 300 (2.4 mL/kg) [I.V.:300]  Out: 0 (0 mL/kg)   Net: 300  Weight: 127 kg        Estimated Blood Loss: Blood Loss: 0 mL       Specimens to Pathology:  * No specimens in log *       Temporary Implants:        Packing:                 Patient Condition: good       Findings (Including unexpected complications): Glomerulations with bleeding on distention  No hunners lesions      Signed:  Ed Blalock, MD  on 10/17/2016 at 1:37 PM

## 2016-10-17 NOTE — Discharge Instructions (Signed)
[facility] PATIENT DISCHARGE PLAN      DATE: 10/17/2016     Procedure: cysto with biopsy              Physician: Ed Blalock, MD       FOR AMBULATORY SURGICAL PATIENTS:  You have received sedative medication and/or general anesthesia which may make you drowsy for as long as 24 hours.     A.) DO NOT drive or operate any machinery for 24 hours    B.) DO NOT drink alcoholic beverages for 24 hours    C.) DO NOT make major decisions, sign contracts, etc. for 24 hours    DIET: Resume your previous diet    ACTIVITY: Rest today walking encouraged no heavy lifting greater than ten pounds       OTHER: if you have a catheter you may remove it Friday or wait until Monday the office closes at 4 on Friday.    PAIN MANAGEMENT RECOMMENDATION: Assess your level of pain on a scale from 0 to 10.      You should take narcotic pain medication if your pain level is greater than three (3) - be sure to eat prior to taking the medication. If your physician has prescribed non-narcotic pain medication, please take as directed.    FOLLOW-UP CARE  Call for an appointment with Dr. Sharrell Ku     Date: call for follow up 7-10 post procedure     Tel: [phone]    Call your doctor for the following problems: FEVER over 101 F/38.4 C; PAIN not relieved with pain medication ordered; SWELLING, REDNESS at incision site, heavy BLEEDING, foul DRAINAGE, INABILITY TO URINATE.     If there is a problem Network engineer      At [facility]. # [phone]

## 2016-10-17 NOTE — Progress Notes (Addendum)
Post op BG 202 mg/dl.  No treatment at this time per Dr. Pollyann Kennedy, Anesthesia as patient will D/C to home today and resume home medications.   Patient reports pain to "bladder", abdomen and back 7/10 as was her baseline report.  Warm packs applied, repositioning and emotional support provided.

## 2016-10-17 NOTE — Op Note (Signed)
PATIENTIKRAN, PATMAN  MR #:  [id number]   CSN:  [id number] DOB:  1976-01-28    AGE:  41     SURGEON:  Ed Blalock, MD,PHD  CO-SURGEON:    ASSISTANT:    SURGERY DATE:  10/17/2016    PREOPERATIVE DIAGNOSIS:  Gross hematuria, urinary retention.    POSTOPERATIVE DIAGNOSIS:  Gross hematuria, urinary retention.    OPERATIVE PROCEDURE:  Cystoscopy.    ANESTHESIA:  General LMA.    COMPLICATIONS:  None.    BLOOD LOSS:  None.    DRAINS LEFT IN PLACE:  None.    FLUIDS:  As per anesthesia record.    INDICATION FOR THE PROCEDURE:  This is a 41 year old female who presented to the clinic in acute urinary retention with a Foley catheter. She also had a UTI that was diagnosed as well as gross hematuria.  She does also have a history significant for cigarette smoking and other environmental exposures.  In the clinic, the patient was cystoscoped and found to have areas of erythema with edema and suspicious patches which may or may not have been related to chronic indwelling Foley catheter.  The patient was able to void and was discharged with a low PVR and presents today for followup.  Importantly, her recent imaging which was ultrasound did not demonstrate hydronephrosis or renal lesions or stones.    PROCEDURE:  After the patient was consented, she was brought to the operating room and placed on the table in supine position.  After satisfactory anesthesia was induced, the patient was then placed in lithotomy position.  After which she was prepped and draped in sterile fashion.  After a time-out was called, and the patient was identified by 2 identifiers, and all parties were in agreement as to the proceedings to take place. We used a rigid cystoscope to gain access to the bladder through the urethra.  Of note, her bladder was a little bit retro-tilted. We had quite a bit of difficulty getting into the bladder with the rigid scope.  We did a surveillance cystoscopy.  The bladder was essentially normal however along the back of  the bladder wall there were some areas of mild inflammation, some of which were friable and bled easily.  No other lesions, masses, or irregularities were discovered. The UOs were in their usual anatomic position.  There were no stones. We emptied the bladder, and the patient was awoken from anesthesia, transferred to a gurney, delivered to the PACU, and should be in good condition for discharge on date of service.  She will go home with antibiotics and Pyridium.  She needs no other pain control as no biopsy was performed.  She is to follow up in approximately 7-10 days in the clinic to discuss findings in detail in the OR.             ______________________________  Ed Blalock, MD,PHD    CB/MODL  DD:  10/17/2016 13:43:22  DT:  10/17/2016 15:48:01  Job #:  [id number]    cc:

## 2016-10-17 NOTE — Anesthesia Postprocedure Evaluation (Signed)
Anesthesia Post-Op Note    Patient: Kaylee Harris    Procedure(s) Performed:  Procedure Summary     Date Anesthesia Start Anesthesia Stop Room / Location    10/17/16 1259 1336 [id number] / [place] MAIN OR       Procedure Diagnosis Surgeon Attending Anesthesia    CYSTOSCOPY BLADDER  (N/A Abdomen) Gross hematuria  (Gross hematuria [R31.0]) Ed Blalock, MD Will Bonnet, MD        Recovery Vitals  BP: 111/58 (10/17/2016  3:10 PM)  Heart Rate: 83 (10/17/2016  3:10 PM)  Heart Rate (via Pulse Ox): 81 (10/17/2016  2:15 PM)  Resp: 18 (10/17/2016  3:10 PM)  Temp: 36.4 C (97.5 F) (10/17/2016  3:10 PM)  SpO2: 92 % (10/17/2016  2:20 PM)   0-10 Scale: 6 (10/17/2016  3:10 PM)  Anesthesia type:  General  Complications Noted During Procedure or in PACU:  None   Comment:    Patient Location:  ASC  Level of Consciousness:    Recovered to baseline  Patient Participation:     Able to participate  Temperature Status:    Normothermic  Oxygen Saturation:    Within patient's normal range  Cardiac Status:   Within patient's normal range  Fluid Status:    Stable  Airway Patency:     Yes  Pulmonary Status:    Baseline  Pain Management:    Adequate analgesia  Nausea and Vomiting:  None    Post Op Assessment:    Tolerated procedure well and no evidence of recall   Attending Attestation:  All indicated post anesthesia care provided     -

## 2016-10-17 NOTE — H&P (Signed)
UPDATES TO PATIENT'S CONDITION on the DAY OF SURGERY/PROCEDURE    I. Updates to Patient's Condition (to be completed by a provider privileged to complete a H&P, following reassessment of the patient by the provider):    Day of Surgery/Procedure Update:  History  History reviewed and no change    Physical  Physical exam updated and no change            II. Procedure Readiness   I have reviewed the patient's H&P and updated condition. By completing and signing this form, I attest that this patient is ready for surgery/procedure.    III. Attestation   I have reviewed the updated information regarding the patient's condition and it is appropriate to proceed with the planned surgery/procedure.    Ed Blalock, MD as of 12:52 PM 10/17/2016

## 2016-10-17 NOTE — Progress Notes (Signed)
Spoke to resident [employer] at approx 1745, OK given to discharge patient. Patient had voided X2 total amt 120cc, bladder scanned for 21 cc.

## 2016-10-17 NOTE — Anesthesia Procedure Notes (Signed)
---------------------------------------------------------------------------------------------------------------------------------------    AIRWAY   GENERAL INFORMATION AND STAFF    Patient location during procedure: OR       Date of Procedure: 10/17/2016 1:13 PM  CONDITION PRIOR TO MANIPULATION     Current Airway/Neck Condition:  Normal        For more airway physical exam details, see Anesthesia PreOp Evaluation  AIRWAY METHOD     Patient Position:  Sniffing    Preoxygenated: yes      Induction: IV  Mask Difficulty Assessment:  1 - vent by mask    Number of Attempts at Approach:  1  FINAL AIRWAY DETAILS    Final Airway Type:  LMA    Final LMA: Unique    LMA Size: 4  ----------------------------------------------------------------------------------------------------------------------------------------

## 2016-10-17 NOTE — Anesthesia Case Conclusion (Signed)
CASE CONCLUSION  [facility]  Actions:  LMA removed  Criteria Used for Airway Removal:  Adequate Tv & RR and acceptable O2 saturation  Assessment:  Routine  Transport  Directly to: PACU  Position:  Recumbent  Patient Condition on Handoff  Level of Consciousness:  Mildly sedated  Patient Condition:  Stable  Handoff Report to:  RN

## 2016-10-18 ENCOUNTER — Telehealth: Payer: Self-pay | Admitting: Urology

## 2016-10-18 ENCOUNTER — Other Ambulatory Visit: Payer: Self-pay | Admitting: Primary Care

## 2016-10-18 ENCOUNTER — Other Ambulatory Visit: Payer: Self-pay | Admitting: Psychiatry

## 2016-10-18 ENCOUNTER — Encounter: Payer: Self-pay | Admitting: Urology

## 2016-10-18 NOTE — Telephone Encounter (Signed)
Patient calling to schedule POP with Dr. Sharrell Ku. 6/26 was offered but patient has an endoscopy that morning and can't make the 9:45 time that's open.    Please call patient to schedule her at [phone]

## 2016-10-21 ENCOUNTER — Other Ambulatory Visit: Payer: Self-pay | Admitting: Psychiatry

## 2016-10-21 MED ORDER — LAMOTRIGINE 150 MG PO TABS *I'
150.0000 mg | ORAL_TABLET | Freq: Every day | ORAL | 0 refills | Status: DC
Start: 2016-10-21 — End: 2017-01-21

## 2016-10-21 MED ORDER — SUMATRIPTAN SUCCINATE 50 MG PO TABS *I*
50.0000 mg | ORAL_TABLET | ORAL | 5 refills | Status: DC | PRN
Start: 2016-10-21 — End: 2017-04-15

## 2016-10-21 NOTE — Telephone Encounter (Signed)
Spoke with patient.  She has been scheduled with Dr. Sharrell Ku on Tuesday 10/29/16 at 1:00 pm at the [facility] [place] office.

## 2016-10-24 ENCOUNTER — Encounter: Payer: Self-pay | Admitting: Primary Care

## 2016-10-24 DIAGNOSIS — E119 Type 2 diabetes mellitus without complications: Secondary | ICD-10-CM

## 2016-10-25 MED ORDER — INSULIN LISPRO (HUMAN) 100 UNIT/ML IJ/SC SOLN *WRAPPED*
SUBCUTANEOUS | 11 refills | Status: DC
Start: 2016-10-25 — End: 2017-04-22

## 2016-10-28 ENCOUNTER — Telehealth: Payer: Self-pay | Admitting: Gastroenterology

## 2016-10-28 ENCOUNTER — Encounter: Payer: Self-pay | Admitting: Primary Care

## 2016-10-28 DIAGNOSIS — R932 Abnormal findings on diagnostic imaging of liver and biliary tract: Secondary | ICD-10-CM

## 2016-10-28 MED ORDER — SUMATRIPTAN SUCCINATE 6 MG/0.5ML SC DEVI *A*
6.0000 mg | Freq: Once | SUBCUTANEOUS | 3 refills | Status: AC | PRN
Start: 2016-10-28 — End: 2016-10-28

## 2016-10-28 NOTE — Telephone Encounter (Signed)
Pre procedure call made to Kaylee Harris for EGD scheduled on 10/29/16 with Dr. Cherylynn Ridges. Informed patient to arrive at department at 745AM             The following information was confirmed:     1. Patient's arrival time and date   2. Parking information/location given to patient  3. Directions to department given  4. Patient to bring the following: Insurance card, Photo ID, Pre procedure medication list, Health History form  5. Any further questions, instructed to reach [place] at [phone], Monday thru Friday, 7:30am to 5:00pm.      Your driver that will stay with you and drive you home when discharged. Driver:Friend    6. Does patient have a pacemaker?  No      7. Does patient take any blood thinners, blood pressure medication or insulin? Yes, Patient instructed that if their doctor did not give them special instructions regarding how much and when to take these medications, they should contact their doctor immediately for instructions.Marland Kitchen

## 2016-10-28 NOTE — Telephone Encounter (Signed)
Routed to provider for advisement

## 2016-10-29 ENCOUNTER — Encounter: Payer: Self-pay | Admitting: Urology

## 2016-10-29 ENCOUNTER — Encounter
Admission: RE | Disposition: A | Discharge status: Home / Self Care | Payer: Self-pay | Source: Ambulatory Visit | Attending: Gastroenterology

## 2016-10-29 ENCOUNTER — Ambulatory Visit: Payer: Medicare (Managed Care) | Attending: Urology | Admitting: Urology

## 2016-10-29 ENCOUNTER — Encounter: Payer: Self-pay | Admitting: Primary Care

## 2016-10-29 ENCOUNTER — Ambulatory Visit
Admission: RE | Admit: 2016-10-29 | Discharge: 2016-10-29 | Disposition: A | Discharge status: Home / Self Care | Payer: Medicare (Managed Care) | Source: Ambulatory Visit | Attending: Gastroenterology | Admitting: Gastroenterology

## 2016-10-29 ENCOUNTER — Telehealth: Payer: Self-pay

## 2016-10-29 VITALS — BP 111/61 | HR 79 | Ht 68.0 in | Wt 281.0 lb

## 2016-10-29 DIAGNOSIS — D494 Neoplasm of unspecified behavior of bladder: Secondary | ICD-10-CM

## 2016-10-29 DIAGNOSIS — K319 Disease of stomach and duodenum, unspecified: Secondary | ICD-10-CM | POA: Insufficient documentation

## 2016-10-29 DIAGNOSIS — K2960 Other gastritis without bleeding: Secondary | ICD-10-CM

## 2016-10-29 HISTORY — PX: PR EGD TRANSORAL BIOPSY SINGLE/MULTIPLE: 43239

## 2016-10-29 LAB — POCT URINALYSIS DIPSTICK
Blood,UA POCT: NEGATIVE
Glucose,UA POCT: NORMAL mg/dL
Ketones,UA POCT: NEGATIVE mg/dL
Leuk Esterase,UA POCT: NEGATIVE
Lot #: 22262702
Nitrite,UA POCT: NEGATIVE
PH,UA POCT: 5 (ref 5–8)
Protein,UA POCT: NEGATIVE mg/dL
Specific gravity,UA POCT: 1.025 (ref 1.002–1.030)

## 2016-10-29 LAB — POCT GLUCOSE: Glucose POCT: 188 mg/dL — ABNORMAL HIGH (ref 60–99)

## 2016-10-29 LAB — POCT BLADDER SCAN PVR

## 2016-10-29 SURGERY — EGD (ESOPHAGOGASTRODUODENOSCOPY)

## 2016-10-29 MED ORDER — FENTANYL CITRATE 50 MCG/ML IJ SOLN *WRAPPED*
INTRAMUSCULAR | Status: AC | PRN
Start: 2016-10-29 — End: 2016-10-29
  Administered 2016-10-29: 100 ug via INTRAVENOUS

## 2016-10-29 MED ORDER — BENZOCAINE 20 % MT SOLN WRAPPED *I*
1.0000 | Freq: Once | OROMUCOSAL | Status: DC
Start: 2016-10-29 — End: 2016-10-29

## 2016-10-29 MED ORDER — MIDAZOLAM HCL 5 MG/5ML IJ SOLN *I*
INTRAMUSCULAR | Status: AC
Start: 2016-10-29 — End: 2016-10-29
  Filled 2016-10-29: qty 10

## 2016-10-29 MED ORDER — FENTANYL CITRATE 50 MCG/ML IJ SOLN *WRAPPED*
INTRAMUSCULAR | Status: AC
Start: 2016-10-29 — End: 2016-10-29
  Filled 2016-10-29: qty 2

## 2016-10-29 MED ORDER — SODIUM CHLORIDE 0.9 % IV SOLN WRAPPED *I*
30.0000 mL/h | Status: DC
Start: 2016-10-29 — End: 2016-10-29
  Administered 2016-10-29: 30 mL/h via INTRAVENOUS

## 2016-10-29 MED ORDER — MIDAZOLAM HCL 5 MG/5ML IJ SOLN *I*
INTRAMUSCULAR | Status: AC | PRN
Start: 2016-10-29 — End: 2016-10-29
  Administered 2016-10-29 (×2): 2 mg via INTRAVENOUS
  Administered 2016-10-29: 1 mg via INTRAVENOUS

## 2016-10-29 SURGICAL SUPPLY — 12 items
ELECTRODE ADULT POLYHESIVE PAT RETURN (Supply) IMPLANT
FORCEPS BIOPSY RADIAL JAW LG (Other) ×2 IMPLANT
FORCEPS DISPOSABLE ALLIGATOR JAW W/NEEDLE (Supply) IMPLANT
PROBE CIRCUMFERENTAIL FIRE (Supply) IMPLANT
PROBE COAG 2000A 6.9FR L2.2M INTEGR STYL W/ FLTR FOR FLX ENDOSCP INTERVENTION FIAPC (Supply) IMPLANT
PROBE COAG 6.9FR L2.2M CNVNENT BLT IN FLTR 2200SC SIMPLIFIED SETUP PLUG PLAY FUNCTIONALITY (Supply) IMPLANT
PROBE SIDE FIRE (Supply)
PROBE STRAIGHT FIRE (Supply)
SYRINGE DIL 60CC PRSS 14ATM 200PSI FOR MON PRSS OF BLLN DIL (Syringe) IMPLANT
SYRINGE INFLAT DIL (Syringe)
TRAP POLYP MULTICHAMBER DISP OPTIMIZER (Other) IMPLANT
TRAPS POLY (Other)

## 2016-10-29 NOTE — Telephone Encounter (Signed)
Pharmacy Notified will process and call patient

## 2016-10-29 NOTE — Telephone Encounter (Signed)
We recieved a prior authorization request via fax. The request is for Sumavel dosepro 6 mg/0.5 ml jet-infectors. The form has been placed in the prior authorization folder for processing.

## 2016-10-29 NOTE — Discharge Instructions (Signed)
[place]  Discharge Instructions  for Gastroscopy    10/29/2016                8:56 AM    Findings: Normal exam    Do not drive, operate heavy machinery, drink alcoholic beverages, make important personal or business decisions, or sign legal documents until next day.    Follow anti-reflux measures. and Return to your usual diet.    Things You May Expect:  * A mild sore throat (Warm liquids or lozenges will soothe the throat)  * You were given medication to help you relax during the test. You need to rest at home for at least 4-6 hours.    You Should Call Your Doctor For Any of the Following:  * Fever, abdominal or chest pain that does not go away  * Cough or trouble breathing  * Bloody or black stools.  * Pain or redness at the IV site    If you have a serious problem after hours.Call [phone] to reach the GI physician on call.    If you are unable to reach your doctor, go to the [facility] Emergency Department.    Follow Up Care:  A letter will be sent to you with the biopsy results in 14 days.    Report will be sent to your primary doctor.    Schedule for clinic in 6-8 weeks- PA.        Provider Signature_________________________________________________________

## 2016-10-29 NOTE — Preop H&P (Signed)
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 Lower abdomen with cellulitis changes, wound probed and purulent material expressed.  Had PICC line for "multiple infiltrations" of what? D/c-ed home on 10d of Augmentin 9/11.      Anginal pain     Anxiety     Arthritis     Asthma     Depression     Diabetes mellitus     Previously on SU and metformin, now diet controlled    Diverticulitis 09/2010    Dysfunctional uterine bleeding     Fibromyalgia     GERD (gastroesophageal reflux disease)     GERD (gastroesophageal reflux disease) 10/04/2016    Hyperlipidemia     Long term (current) use of insulin, Dermal Adhesed V-Go Insulin Delivery Device 10/04/2016    Migraine     Neuromuscular disorder     POTS (postural orthostatic tachycardia syndrome)     Sebaceous cyst of breast     right axilla    TMJ click, left 3/0/1601    Varicella      Past Surgical History:   Procedure Laterality Date    APPENDECTOMY  2016    arthroscopic shoulder surgery Right 2010    Related to lifting    CARDIAC CATHETERIZATION  09/2011    negative    COLON SURGERY      DILATION AND CURETTAGE OF UTERUS  2005, 2007    x2 for menorraghia    LEFT COLECTOMY  01/03/14    Dr Tresa Res    loop recorder  Feb 03 2014    loop recorder removal      PR COLONOSCOPY THRU COLOTOMY N/A 02/16/2016    Procedure: COLONOSCOPY;  Surgeon: Kelly Splinter, MD;  Location: [facility];  Service: GI    PR CYSTOURETHROSCOPY,BIOPSY N/A 10/17/2016    Procedure: CYSTOSCOPY BLADDER ;  Surgeon: Ed Blalock, MD;  Location: [facility];  Service: Urology    TONSILLECTOMY       Family History   Problem Relation Age of Onset    Hypertension Father     Diabetes Father     Kidney Disease Father     Elevated lipids Father      Heart attack Father 62    Other Father      PVD    Heart Disease Father     Hypertension Mother     Elevated lipids Mother     Heart attack Mother 88    Diabetes Mother     Eczema Mother     Psoriasis Mother     Heart Disease Mother     Hypertension Brother     Heart Disease Brother 85     prinzmetal's angina    Heart Disease Sister      currently having work up    Maury Sister     Hypertension Sister     Breast cancer Maternal Grandmother     Stroke Other     Cancer Sister     Thyroid disease Sister     Anesth problems Neg Hx      Social History     Social History    Marital status: Married     Spouse name: N/A  Number of children: N/A    Years of education: N/A     Social History Main Topics    Smoking status: Former Smoker     Packs/day: 0.50     Years: 3.00     Types: Cigarettes    Smokeless tobacco: Never Used      Comment: quit age late 47s    Alcohol use 0.0 oz/week     0 Standard drinks or equivalent per week      Comment: 0-1 drink/ month    Drug use: No    Sexual activity: Yes     Partners: Female     Patent examiner protection: IUD     Other Topics Concern    None     Social History Narrative    ** Merged History Encounter **         Married to Pope, recently relocated back to [place] from [place] due to family stressors. 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        Allergies:    Allergies   Allergen Reactions    Morphine Anaphylaxis    Toradol [Ketorolac Tromethamine] Anaphylaxis     Notes feeling tightness in her throat and full body itching after last dose a month ago. Also notes erythema tracking along her vein after injection. This is not reflected by documentation.  Tolerates Naproxen.    Trazodone Anaphylaxis    Seasonal Allergies Itching    Other [Other] Other (See Comments)     Derma Bond(skin glue) pruritis/redness and c/o respiratory distress.   Received name: Other       Medications:  Prescriptions Prior  to Admission   Medication Sig    SUMAtriptan (IMITREX) 50 MG tablet Take 1 tablet (50 mg total) by mouth as needed for Migraine   Take at onset of headache. May repeat once in 2 hours.    lamoTRIgine (LAMICTAL) 150 MG tablet Take 1 tablet (150 mg total) by mouth daily    busPIRone (BUSPAR) 30 MG tablet Take 1 tablet (30 mg total) by mouth 2 times daily    mirtazapine (REMERON) 15 MG tablet Take 1 tablet (15 mg total) by mouth nightly    DULoxetine (CYMBALTA) 60 MG capsule Take 1 capsule (60 mg total) by mouth daily    traMADol (ULTRAM) 50 MG tablet Take 1 tablet (50 mg total) by mouth every 8 hours as needed (for head pain and abd pain)   Max daily dose: 150 mg    dulaglutide (TRULICITY) 1.5 VF/6.4PP injection pen Inject 0.5 mLs (1.5 mg total) into the skin every 7 days    fluticasone (FLONASE) 50 MCG/ACT nasal spray 1 spray by Nasal route daily    metoprolol (TOPROL-XL) 25 MG 24 hr tablet Take by mouth daily       pantoprazole (PROTONIX) 40 MG EC tablet Take 1 tablet (40 mg total) by mouth daily   SWALLOW WHOLE. DO NOT CRUSH, BREAK, OR CHEW.    topiramate (TOPAMAX) 100 MG tablet Take 1 tablet (100 mg total) by mouth 2 times daily    atorvastatin (LIPITOR) 40 MG tablet TAKE 1 TABLET BY MOUTH EVERY DAY WITH DINNER    insulin glargine 300 UNIT/ML SOPN Inject 30 Units into the skin nightly   FREE voucher will be used; DO NOT Buyer, retail (Patient taking differently: Inject 30 Units into the skin nightly   Only when V-Go not being used; FREE voucher will be used; DO NOT bill  Insurance)    ascorbic acid (VITAMIN C) 100 MG tablet Take 100 mg by mouth daily    midodrine (PROAMATINE) 10 MG tablet Take 1 tablet (10 mg total) by mouth 3 times daily    promethazine (PHENERGAN) 12.5 MG tablet Take 1 tablet (12.5 mg total) by mouth 4 times daily as needed    cetirizine (ZYRTEC) 10 MG tablet Take 10 mg by mouth daily    levonorgestrel (MIRENA) 20 MCG/24HR IUD 1 each by Intrauterine route once    insulin  lispro 100 UNIT/ML injection vial For VGO: Dinner 43X (6 clicks) ; B & L 4- 6 units. BG 100 - 150 8u (4clk), 151 - 200 10U (5clk), >201 12U ( 6 ck). MDD 72U (36 clk)    phenazopyridine (PYRIDIUM) 100 MG tablet Take 1 tablet (100 mg total) by mouth every 8 hours as needed    famciclovir (FAMVIR) 500 MG tablet Take 1 tablet (500 mg total) by mouth 3 times daily as needed    Insulin Disposable Pump (V-GO 30) KIT Use daily; 30ct/month.    clonazePAM (KLONOPIN) 0.5 MG tablet Take 1 tablet (0.5 mg total) by mouth daily as needed   Max daily dose: 0.5 mg    albuterol HFA 108 (90 BASE) MCG/ACT inhaler Inhale 1-2 puffs into the lungs every 6 hours as needed for Wheezing   Shake well before each use.    insulin syringe-needle U-100 (BD ULTRAFINE) 31G X 5/16" 0.3 ML HALF-UNIT Use 3 times a day as instructed.    FREESTYLE LITE test strip Use four times daily as directed for 250.02    blood glucose monitor system Brand: cheapest brand available per her insurance.  Use as directed.    lancets Brand Free Style Lite; Use 2 times per day as directed for blood glucose testing.    Alcohol Swabs (ALCOHOL WIPES) PADS Use BID for BG check    Non-System Medication The above patient is followed in our clinic and cannot resume work permanently.     Current Facility-Administered Medications   Medication Dose Route Frequency    sodium chloride 0.9 % IV  30 mL/hr Intravenous Continuous    benzocaine (HURRICAINE) 20 % mouth spray 1 spray  1 spray Mouth/Throat Once     No current outpatient prescriptions on file.     Vitals:    10/29/16 0756   BP: 146/70   Pulse: 76   Resp: 16   Temp: 35.6 C (96.1 F)   Weight: 127.5 kg (281 lb)   Height: 172.7 cm (_0 )           Physical Examination:Oropharynx: mucosa pink and moist  Lungs: percussion normal, good diaphragmatic excursion, lungs clear to auscultation bilaterally  Heart: regular rate and rhythm, no murmurs, gallops or rubs  Abdomen: abdomen soft, non-tender, nondistended, normal  active bowel sounds, no masses or organomegaly  Neuro: No focal neurology  Extremities/Musculoskeletal: No rash,cyanosis,or edema noted       Last Lab Results:   Lab Results   Component Value Date/Time    WBC 12.0 (H) 09/18/2016 10:50 PM    HCT 37 09/18/2016 10:50 PM    PLT 339 09/18/2016 10:50 PM    INR 1.0 10/01/2014 06:37 PM    AST 25 09/17/2016 03:58 PM    ALT 33 09/17/2016 03:58 PM    ALK 129 (H) 09/17/2016 03:58 PM    TB 0.3 09/17/2016 03:58 PM    AMY 32 09/17/2016 03:58 PM    LIP 31 09/17/2016  03:58 PM    NA 138 10/04/2016 03:23 PM    K 4.4 10/04/2016 03:23 PM    UN 9 10/04/2016 03:23 PM    CREAT 0.79 10/04/2016 03:23 PM    CREAT 0.78 10/01/2014 06:37 PM         Radiology impressions (last 30 days):  No results found.    Currently Active Problems:  Patient Active Problem List   Diagnosis Code    DM (diabetes mellitus), type 2, uncontrolled E11.65    Migraine with aura G43.109    Asthma J45.909    Hyperlipemia E78.5    Fibromyalgia M79.7    Syncope and collapse- recurrent, working diagnosis vascular vs psychogenic. 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    IUD (intrauterine device) in place Z97.5    IBS (irritable bowel syndrome) K58.9    Mixed obsessional thoughts and acts F42.2    Carpal tunnel syndrome G56.00    Meralgia paresthetica of left side G57.12    No diabetic retinopathy in both eyes Z03.89    At risk for abuse of opiates Z91.89    History of repeated overdose Z91.5    Borderline personality disorder F60.3    Fatty liver disease, nonalcoholic I34.7    GERD (gastroesophageal reflux disease) Q25.9    TMJ click, left D63.875    Anxiety F41.9    Bipolar 1 disorder F31.9    POTS (postural orthostatic tachycardia syndrome) R00.0, I95.1    Traumatic hemorrhage of right cerebrum S06.349A    Diverticulitis K57.92    Long term (current) use of insulin, Dermal Adhesed V-Go Insulin Delivery Device Z79.4    Morbid obesity  with BMI of 40.0-44.9, adult E66.01, Z68.41    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 Updates to Patient's Condition (to be completed by a provider privileged to complete a H&P, following reassessment of the patient by the provider):    Full H&P done today; no updates needed.    II. Procedure Readiness   I have reviewed the patient's H&P and updated condition. By completing and signing this form, I attest that this patient is ready for surgery/procedure.      III. Attestation   I have reviewed the updated information regarding the patient's condition and it is appropriate to proceed with the planned surgery/procedure.    Kelly Splinter, MD as of 8:39 AM 10/29/2016

## 2016-10-29 NOTE — Telephone Encounter (Signed)
Prior Authorization sent to patients insurance

## 2016-10-29 NOTE — Procedures (Signed)
Endoscopic Gastroduodenoscopy Procedure Note    Procedure: Endoscopic Gastroduodenoscopy --diagnostic, with biopsy    Pre-operative Diagnosis: Diarrhea, GERD, RUQ abdominal pain    Post-operative Diagnosis: See below    Sedation: Versed 5 mg IV, fentanyl 100 mcg IV    Pre-Procedure Physical:  Patient's medications, allergies, past medical, surgical, social and family histories were reviewed and updated as appropriate.    BP (!) 127/96   Pulse 74   Temp 35.6 C (96.1 F) (Temporal)    Resp (!) 8   Ht 172.7 cm (5\' 8" )   Wt 127.5 kg (281 lb)   SpO2 96%   BMI 42.73 kg/m2    Airway:  normal  Heart:  normal S1 and S2  Lungs:  clear  Abdomen:  soft, nontender, normal bowel sounds  Mental Status:  awake and alert; oriented to person, place, and time      ASA Class: 2    Procedure Details     Informed consent was obtained for the procedure, including conscious sedation. Risks of pancreatitis, infection, perforation, hemorrhage, adverse drug reaction and aspiration were discussed. The patient was placed in the left lateral decubitus position.  Based on the pre-procedure assessment, including review of the patient's medical history, medications, allergies, and review of systems, she had been deemed to be an appropriate candidate for conscious sedation; she was therefore sedated with the medications listed below.  She was monitored continuously with ECG tracing, pulse oximetry, blood pressure monitoring, and direct observation.      The gastroscope was inserted into the mouth and advanced under direct vision to second portion of the duodenum.  A careful inspection was made as the gastroscope was withdrawn, including a retroflexed view of the proximal stomach; findings and interventions are described below. Appropriate photodocumentation was obtained.    Estimated Blood Loss:  There was no significant blood loss during the procedure.    Findings: 1. Esophagus normal. Z-line regular.  2. Stomach normal. Cold biopsied and sent to  pathology.   3. Duodenum normal. Cold biopsied and sent to pathology.     Specimens: antrum, duodenum           Complications:  None; patient tolerated the procedure well.           Disposition: Recovery           Condition: stable    Attending Attestation: I performed the procedure.    Impression:  1. Normal exam- no source for symptoms identified.    Recommendations: 1. Await pathology  2. Healthy eating counseled.  3.  Increase fiber in diet- more fruits/vegetables and salads. Avoid processed fiber products e.g. Cereals or fiber bars. Goal is 5 servings daily- Strive for Five!  4. Citrucel 1-2 TBSP daily  5. Lactose intolerance- Avoid dairy or use lactaid pills.  6. Follow up in clinic in 6 weeks- if not better, she can trial Viberzi.

## 2016-10-29 NOTE — Telephone Encounter (Signed)
Spoke to patients pharmacy the medication Sumavel dosepro is being discontinued. Pharmacist stated they have 2 left but after that they will need an alternative.

## 2016-10-29 NOTE — Progress Notes (Signed)
Visit Diagnosis(es) or CC:   Gross hematuria     HPI:    Patient returns for follow up of the above cc. The patient denies hematuria, dysuria, frequency or urgency. The patient reports easy bruising and spotting had an episode of bleeding per vagina excessively now with IUD to stop bleeding which worked. Here with partner     Medications social and family no changes         ROS:    Systemic: Denies recent weight loss, weight gain, fever, chills, or fatigue.  Respiratory: Denies SOB. No wheeze or cough   Heart: Denies chest pain or palpitations.  GI:  Denies change in bowel habits. No n/v or abdominal pain   GU: Denies urogenital complaints, apart from as documented in HPI.    Musculoskeletal: Denies any new muscle or joint pain, stiffness, or arthritis.  All others reviewed and negative     PE:    GENERAL:  NAD  NEUROLOGIC:  Oriented to person, place, time, and situation, cn grossly intact  PSYCHIATRIC:  Normal mood and affect  HEENT:  Normocephalic, atraumatic.  Sclerae anicteric. eomi eye glasses in place  Nose/mouth without lesions.  NECK:  Trachea midline.  No JVD in sitting position.    SKIN:  Normal color, turgor, texture, hydration  LUNGS:  Respirations unlabored.  ctab no wheeze or crackles   HEART:  Regular rate and rhythm    BACK/ORTHO:  No CVA tenderness.  No obvious back deformities.  No tenderness of the axial skeleton.    ABDOMEN:  Soft, nontender, nondistended, and without masses.  No evidence of bladder distention.  +bs no rebound or guarding   EXTREMITIES:  No evidence of cyanosis or clubbing.  Edema not evident. Calves non-tender        Labs/X-Rays:    PVR 43ml       Assessment:  Gross hematuria  With negative imaging and negative cystoscopy       Plan:  Follow up in 6 months with KUB  Follow up with PCP for bleeding disorder evaluation     Ed Blalock, MD

## 2016-10-29 NOTE — Telephone Encounter (Signed)
Received back from [facility]  approval for patients Rx  Sumavel dosepro Case number [id number]  Approved for Start 05.27.2018  End 12.31.2099

## 2016-10-30 ENCOUNTER — Encounter: Payer: Self-pay | Admitting: Gastroenterology

## 2016-10-30 LAB — SURGICAL PATHOLOGY

## 2016-10-31 NOTE — Telephone Encounter (Signed)
Please help with MRI  [name], MD  [facility]  10/31/2016  8:04 AM

## 2016-11-04 ENCOUNTER — Ambulatory Visit: Payer: Medicare (Managed Care)

## 2016-11-04 ENCOUNTER — Ambulatory Visit: Payer: Medicare (Managed Care) | Attending: Psychiatry | Admitting: Psychiatry

## 2016-11-04 ENCOUNTER — Encounter: Payer: Self-pay | Admitting: Psychiatry

## 2016-11-04 VITALS — BP 135/69 | HR 86 | Resp 18 | Ht 67.99 in | Wt 280.0 lb

## 2016-11-04 DIAGNOSIS — F603 Borderline personality disorder: Secondary | ICD-10-CM | POA: Insufficient documentation

## 2016-11-04 DIAGNOSIS — F422 Mixed obsessional thoughts and acts: Secondary | ICD-10-CM

## 2016-11-04 DIAGNOSIS — F3341 Major depressive disorder, recurrent, in partial remission: Secondary | ICD-10-CM

## 2016-11-04 MED ORDER — DULOXETINE HCL 20 MG PO CPEP *I*
DELAYED_RELEASE_CAPSULE | ORAL | 0 refills | Status: DC
Start: 2016-11-04 — End: 2016-12-04

## 2016-11-04 NOTE — Progress Notes (Signed)
Behavioral Health Progress Note     LENGTH OF SESSION: 30 minutes    Contact Type:  Location: On Site    Face to Face     Problem(s)/Goals Addressed from Treatment Plan:    Problem 1:   Treatment Problem #1 09/09/2016   Patient Identified Problem Depression and anxiety       Goal for this problem:    Treatment Goal #1 09/09/2016   Patient Identified Goal Learn DBT and PST skills to manage affect       Progress towards this goal: Problem resolving. Comment: Patient reports a stable mood.    Mental Status Exam:  APPEARANCE: Appears stated age, Well-groomed, Casual  ATTITUDE TOWARD INTERVIEWER: Cooperative  MOTOR ACTIVITY: WNL (within normal limits)  EYE CONTACT: Direct  SPEECH: Normal rate and tone  AFFECT: Happy and Neutral  MOOD: Lively and Neutral  THOUGHT PROCESS: Normal  THOUGHT CONTENT: No unusual themes  PERCEPTION: No evidence of hallucinations  CURRENT SUICIDAL IDEATION: patient denies  CURRENT HOMICIDAL IDEATION: Patient denies  ORIENTATION: Alert and Oriented X 3.  CONCENTRATION: Good  MEMORY:   Recent: intact   Remote: intact  COGNITIVE FUNCTION: Average intelligence  JUDGMENT: Intact  IMPULSE CONTROL: Fair  INSIGHT: Fair    Risk Assessment:  ASSESSMENT OF RISK FOR SUICIDAL BEHAVIOR  Changes in risk for suicide from baseline Formulation of Risk and/or previous intake, including newly identified risk, if any: none  Violence risk was assessed and No Change noted from baseline formulation of risk and/or previous assessment.    Session Content:: Patient reports a stable mood. She said that the kidney biopsy went well and there is nothing wrong. Patient said that she still has to have a liver test because of something they saw on the imaging. Patient said that her brother came to see her while she was in the hospital and she was surprised by this. She said that he did not show up for the Father's Day celebration but she let him know where they were going and it is up to him. Writer commended patient on her  observing this limit. Patient told Probation officer that she had a huge "blow out" with her wife. She said that she did not go to [facility]. Patient said that one of their friends wanted to come over and play cards and her wife just wanted to watch TV. She said that this is their only friend as she lost all of her friends from school due to being with her wife and then the friends from work. Patient said that she still has her friends in [place] and she talks with them on the phone, but it's not the same. She said that her wife eventually gave in and their friend came over. Writer and patient discussed patient's needs in this regard and the communication pattern and how to communicate more effectively. Writer asked patient whether she would consider going to [employer] and patient said that she would be interested. Writer gave patient the calendar and patient said that she would check this out.    Visit Diagnosis:      ICD-10-CM ICD-9-CM   1. MDD (major depressive disorder), recurrent, in partial remission F33.41 296.35     Interventions:  Resource information given for:  [employer]  Supportive Psychotherapy  Taught/practiced communication skills (specify skills used):  interpersonal  Solution Focused therapy    Current Treatment Plan   Created/Updated On 09/09/2016   Next Treatment Plan Due 12/09/2016  Plan:  Psychotherapy continues as described in care plan; plan remains the same.    NEXT APPT: 12/09/16    Tresa Res, LCSW

## 2016-11-04 NOTE — Progress Notes (Signed)
Behavioral Health Psychopharmacology Follow-up     Length of Session: 20 minutes.    Diagnosis Addressed    ICD-10-CM ICD-9-CM   1. Borderline personality disorder F60.3 301.83   2. 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 Mixed obsessional thoughts and acts F42.2 300.3       Recent History and Response to Medications  Patient states: the depression is "a lot better."  She says she believes it is the combination of the medications and using skills she has learned in therapy.  She still has times where her mood gets a little labile "but it is better than it used to be."  She has not been using her Klonopin in the past two weeks but did use it when she went to visit her wife's grandmother.  She believes the mirtazapine, Lamictal and Buspar all help her mood as when we have tried to lower them, she has more symptoms.  The only medication she is not sure about it the Cymbalta.      HPI     Follow-up    Additional comments: patient is here to follow up on her medications       Last edited by Shirlee More, LPN on 06/10/9561  8:75 PM. (History)          Current use of alcohol or drugs: No        Neurovegetative Symptoms Review:  Energy level: fair  Concentration: fair  Sleep Quality: fair   Number of hours :  (5-7)  Appetite: fair     Wt Readings from Last 3 Encounters:   11/04/16 127 kg (280 lb)   10/29/16 127.5 kg (281 lb)   10/29/16 127.5 kg (281 lb)     Enjoyment/interest: good    Current Medications  Current Outpatient Prescriptions   Medication Sig    SUMAVEL DOSEPRO 6 MG/0.5ML needle-free injection     insulin lispro 100 UNIT/ML injection vial For VGO: Dinner 64P (6 clicks) ; B & L 4- 6 units. BG 100 - 150 8u (4clk), 151 - 200 10U (5clk), >201 12U ( 6 ck). MDD 72U (36 clk)    SUMAtriptan (IMITREX) 50 MG tablet Take 1 tablet (50 mg total) by mouth as needed for Migraine   Take at onset of headache. May repeat once in 2 hours.    lamoTRIgine (LAMICTAL) 150 MG tablet  Take 1 tablet (150 mg total) by mouth daily    busPIRone (BUSPAR) 30 MG tablet Take 1 tablet (30 mg total) by mouth 2 times daily    mirtazapine (REMERON) 15 MG tablet Take 1 tablet (15 mg total) by mouth nightly    DULoxetine (CYMBALTA) 60 MG capsule Take 1 capsule (60 mg total) by mouth daily    famciclovir (FAMVIR) 500 MG tablet Take 1 tablet (500 mg total) by mouth 3 times daily as needed    traMADol (ULTRAM) 50 MG tablet Take 1 tablet (50 mg total) by mouth every 8 hours as needed (for head pain and abd pain)   Max daily dose: 150 mg    Insulin Disposable Pump (V-GO 30) KIT Use daily; 30ct/month.    dulaglutide (TRULICITY) 1.5 PI/9.5JO injection pen Inject 0.5 mLs (1.5 mg total) into the skin every 7 days    fluticasone (FLONASE) 50 MCG/ACT nasal spray 1 spray by Nasal route daily    metoprolol (TOPROL-XL) 25 MG 24 hr tablet Take by mouth daily       pantoprazole (PROTONIX) 40  MG EC tablet Take 1 tablet (40 mg total) by mouth daily   SWALLOW WHOLE. DO NOT CRUSH, BREAK, OR CHEW.    topiramate (TOPAMAX) 100 MG tablet Take 1 tablet (100 mg total) by mouth 2 times daily    albuterol HFA 108 (90 BASE) MCG/ACT inhaler Inhale 1-2 puffs into the lungs every 6 hours as needed for Wheezing   Shake well before each use.    atorvastatin (LIPITOR) 40 MG tablet TAKE 1 TABLET BY MOUTH EVERY DAY WITH DINNER    ascorbic acid (VITAMIN C) 100 MG tablet Take 100 mg by mouth daily    midodrine (PROAMATINE) 10 MG tablet Take 1 tablet (10 mg total) by mouth 3 times daily    insulin syringe-needle U-100 (BD ULTRAFINE) 31G X 5/16" 0.3 ML HALF-UNIT Use 3 times a day as instructed.    promethazine (PHENERGAN) 12.5 MG tablet Take 1 tablet (12.5 mg total) by mouth 4 times daily as needed    FREESTYLE LITE test strip Use four times daily as directed for 250.02    blood glucose monitor system Brand: cheapest brand available per her insurance.  Use as directed.    lancets Brand Free Style Lite; Use 2 times per day as  directed for blood glucose testing.    Alcohol Swabs (ALCOHOL WIPES) PADS Use BID for BG check    cetirizine (ZYRTEC) 10 MG tablet Take 10 mg by mouth daily    levonorgestrel (MIRENA) 20 MCG/24HR IUD 1 each by Intrauterine route once    Non-System Medication The above patient is followed in our clinic and cannot resume work permanently.    phenazopyridine (PYRIDIUM) 100 MG tablet Take 1 tablet (100 mg total) by mouth every 8 hours as needed    clonazePAM (KLONOPIN) 0.5 MG tablet Take 1 tablet (0.5 mg total) by mouth daily as needed   Max daily dose: 0.5 mg    insulin glargine 300 UNIT/ML SOPN Inject 30 Units into the skin nightly   FREE voucher will be used; DO NOT Buyer, retail (Patient taking differently: Inject 30 Units into the skin nightly   Only when V-Go not being used; FREE voucher will be used; DO NOT Buyer, retail)     No current facility-administered medications for this visit.        Side Effects  Patient Reported Side Effects: None reported    Mental Status  APPEARANCE: Casual  ATTITUDE TOWARD INTERVIEWER: Cooperative  MOTOR ACTIVITY: WNL (within normal limits)  EYE CONTACT: Direct  SPEECH: Normal rate and tone  AFFECT: Full Range and Appropriate  MOOD: Euthymic  THOUGHT PROCESS: Normal  THOUGHT CONTENT: No unusual themes  PERCEPTION: Within normal limits  ORIENTATION: Alert and Oriented X 3.  CONCENTRATION: Good  MEMORY:   Recent: intact   Remote: intact  COGNITIVE FUNCTION: Average intelligence  JUDGMENT: Intact  IMPULSE CONTROL: Fair  INSIGHT: Good and Fair    Risk Assessment    Self Injury: Patient Denies  Suicidal Ideation: Patient Denies  Homicidal Ideation: Patient Denies  Aggressive Behavior: Patient Denies    Results  none    BP Readings from Last 3 Encounters:   11/04/16 135/69   10/29/16 111/61   10/29/16 130/89       Assessment  FORMULATION: Patient's mood is very stable and in the minimal range of symptoms for depression and anxiety.  We have tried lowering the mirtazapine,  Lamictal and Buspar which all caused some mood instability.  It is not clear how much the Cymbalta  may be helping her pain or the depression.  Writer questioned if she wanted to try lowering the dose to which she agreed.  We discussed lowering it to 40 mg daily to which she agreed.  She is taking the Klonopin appropriately.  Her wife is still locking her medications which writer suggested seh continue due to the possible mood lability.  The mood changes are most likely related to the BPD.      Recommendations/Plan and Rationale  PLAN: Decrease Cymbalta to 20 mg two tablets daily in the morning.  Continue other medications.  Return in 4 weeks.

## 2016-11-07 ENCOUNTER — Other Ambulatory Visit: Payer: Self-pay | Admitting: Primary Care

## 2016-11-07 ENCOUNTER — Other Ambulatory Visit: Payer: Self-pay | Admitting: Pulmonary and Critical Care Medicine

## 2016-11-07 ENCOUNTER — Encounter: Payer: Self-pay | Admitting: Primary Care

## 2016-11-07 DIAGNOSIS — R112 Nausea with vomiting, unspecified: Secondary | ICD-10-CM

## 2016-11-07 DIAGNOSIS — K589 Irritable bowel syndrome without diarrhea: Secondary | ICD-10-CM

## 2016-11-07 DIAGNOSIS — Z760 Encounter for issue of repeat prescription: Secondary | ICD-10-CM

## 2016-11-07 MED ORDER — PROMETHAZINE HCL 12.5 MG PO TABS *I*
12.5000 mg | ORAL_TABLET | Freq: Four times a day (QID) | ORAL | 0 refills | Status: DC | PRN
Start: 2016-11-07 — End: 2017-06-09

## 2016-11-07 MED ORDER — ALBUTEROL SULFATE HFA 108 (90 BASE) MCG/ACT IN AERS *I*
1.0000 | INHALATION_SPRAY | Freq: Four times a day (QID) | RESPIRATORY_TRACT | 5 refills | Status: DC | PRN
Start: 2016-11-07 — End: 2018-02-02

## 2016-11-07 MED ORDER — FLUTICASONE PROPIONATE 50 MCG/ACT NA SUSP *I*
1.0000 | Freq: Every day | NASAL | 3 refills | Status: DC
Start: 2016-11-07 — End: 2017-02-28

## 2016-11-07 MED ORDER — SUMATRIPTAN SUCCINATE 6 MG/0.5ML SC SOAJ *A*
6.0000 mg | Freq: Once | SUBCUTANEOUS | 0 refills | Status: AC | PRN
Start: 2016-11-07 — End: 2016-11-07

## 2016-11-07 NOTE — Addendum Note (Signed)
Addended by: Grandville Silos on: 11/07/2016 09:52 PM     Modules accepted: Orders

## 2016-11-07 NOTE — Telephone Encounter (Signed)
New Rx for imitrex stat dose kit sent. Please see if this or generic sumatriptan 61m/mL is available    JJohny Drilling MD  [facility]  11/07/2016  9:51 PM

## 2016-11-08 ENCOUNTER — Other Ambulatory Visit: Payer: Self-pay | Admitting: Gastroenterology

## 2016-11-08 MED ORDER — ATORVASTATIN CALCIUM 40 MG PO TABS *I*
40.0000 mg | ORAL_TABLET | Freq: Every day | ORAL | 3 refills | Status: DC
Start: 2016-11-08 — End: 2021-03-22

## 2016-11-08 MED ORDER — SUMATRIPTAN SUCCINATE 6 MG/0.5ML SC SOAJ *A*
6.0000 mg | Freq: Once | SUBCUTANEOUS | 5 refills | Status: DC | PRN
Start: 2016-11-08 — End: 2017-09-25

## 2016-11-08 MED ORDER — TRAMADOL HCL 50 MG PO TABS *I*
50.0000 mg | ORAL_TABLET | Freq: Three times a day (TID) | ORAL | 0 refills | Status: DC | PRN
Start: 2016-11-08 — End: 2017-01-21

## 2016-11-08 NOTE — Telephone Encounter (Signed)
Kaylee Harris - 9 Prescriptions  Confidential Drug Report  Search Terms: Kaylee Harris, 1976/01/30   Search Date: 11/08/2016 07:36:13 AM   Searching on behalf of: [id number] - [name]   The Drug Utilization Report below displays all of the controlled substance prescriptions, if any, that your patient has filled in the last twelve months. The information displayed on this report is compiled from pharmacy submissions to the Department, and accurately reflects the information as submitted by the pharmacies.  This report was requested by: Verdell Carmine   Reference #: [id number]   [facility]  Patient Name: Kaylee Harris Birth Date: May 23, 1975   Address: [address] [place], [place] [address] Sex: Female   Rx Written Rx Dispensed Drug Quantity Days Supply Prescriber Name Payment Method Dispenser   01/15/2016 01/15/2016 tramadol hcl 50 mg tablet  8 2 [place], [place]. #02   Others' Prescriptions  Patient Name: Kaylee Harris Birth Date: [birth year]   Address: [place], [place] [address] Sex: Female   Rx Written Rx Dispensed Drug Quantity Days Supply Prescriber Name Payment Method Dispenser   09/27/2016 10/01/2016 tramadol hcl 50 mg tablet  42 14 [place], [place]. #02   09/17/2016 09/17/2016 tramadol hcl 50 mg tablet  42 14 [place], [place]. #02   08/13/2016 08/19/2016 clonazepam 0.5 mg tablet  30 30 Corigliano, [name] D NP [employer] #02   06/21/2016 06/21/2016 diphenoxylate-atropine 2.5-0.025 mg tablet  240 30 Telford Nab A ([employer] [place]. #02   06/04/2016 06/06/2016 tramadol hcl 50 mg tablet  84 14 [name], Darnell Level (MD) [place]. #02   02/29/2016 03/03/2016 tramadol hcl 50 mg tablet  8 14 [name], Darnell Level (MD) [place]. #02   01/19/2016 01/19/2016 tramadol hcl 50 mg tablet  44 14  [name], Darnell Level (MD) [place]. #02   12/05/2015 12/05/2015 diphenoxylate-atropine 2.5-0.025 mg tablet  77 14 [facility], [place]. #02   * - Drugs marked with an asterisk are compound drugs. If the compound drug is made up of more than one controlled substance, then each controlled substance will be a separate row in the table.

## 2016-11-08 NOTE — Telephone Encounter (Signed)
Thanks!  imitrex stat dose kit sent with refils.    [name], MD  [facility]  11/08/2016  9:52 AM

## 2016-11-08 NOTE — Addendum Note (Signed)
Addended by: Grandville Silos on: 11/08/2016 09:52 AM     Modules accepted: Orders

## 2016-11-08 NOTE — Telephone Encounter (Signed)
Spoke to patients pharmacy, the medication Imitrex stat dose kit and the generic sumatriptan is available and patients insurance covers it with a low co pay of $1.25.

## 2016-11-12 ENCOUNTER — Encounter: Payer: Self-pay | Admitting: Primary Care

## 2016-11-13 ENCOUNTER — Ambulatory Visit: Payer: Medicare (Managed Care) | Admitting: Pulmonary and Critical Care Medicine

## 2016-11-14 ENCOUNTER — Ambulatory Visit: Payer: Self-pay | Admitting: Primary Care

## 2016-11-15 ENCOUNTER — Other Ambulatory Visit: Payer: Self-pay | Admitting: Primary Care

## 2016-11-15 DIAGNOSIS — G43909 Migraine, unspecified, not intractable, without status migrainosus: Secondary | ICD-10-CM

## 2016-11-15 MED ORDER — TOPIRAMATE 100 MG PO TABS *I*
100.0000 mg | ORAL_TABLET | Freq: Two times a day (BID) | ORAL | 5 refills | Status: DC
Start: 2016-11-15 — End: 2017-05-08

## 2016-11-19 ENCOUNTER — Encounter: Payer: Self-pay | Admitting: Primary Care

## 2016-11-20 ENCOUNTER — Encounter: Payer: Self-pay | Admitting: Primary Care

## 2016-11-21 ENCOUNTER — Encounter: Payer: Self-pay | Admitting: Primary Care

## 2016-11-21 ENCOUNTER — Other Ambulatory Visit: Payer: Self-pay | Admitting: Primary Care

## 2016-11-21 ENCOUNTER — Telehealth: Payer: Self-pay

## 2016-11-21 MED ORDER — PANTOPRAZOLE SODIUM 40 MG PO TBEC *I*
40.0000 mg | DELAYED_RELEASE_TABLET | Freq: Every day | ORAL | 5 refills | Status: DC
Start: 2016-11-21 — End: 2017-05-05

## 2016-11-21 MED ORDER — DIAZEPAM 5 MG PO TABS *I*
ORAL_TABLET | ORAL | 0 refills | Status: DC
Start: 2016-11-21 — End: 2017-01-20

## 2016-11-21 MED ORDER — FREESTYLE LIBRE SENSOR SYSTEM MISC *A*
5 refills | Status: DC
Start: 2016-11-21 — End: 2017-06-10

## 2016-11-21 MED ORDER — FREESTYLE LIBRE READER DEVI *A*
0 refills | Status: DC
Start: 2016-11-21 — End: 2017-06-10

## 2016-11-21 NOTE — Telephone Encounter (Signed)
We got a fax from [name]. They said that the patient needs a new RX for a freestyle lite meter. Her insurance doesn't cover the freestyle libre meter. Please advise.

## 2016-11-23 ENCOUNTER — Ambulatory Visit
Admission: AD | Admit: 2016-11-23 | Discharge: 2016-11-23 | Disposition: A | Discharge status: Home / Self Care | Payer: Medicare (Managed Care) | Source: Ambulatory Visit | Attending: Emergency Medicine | Admitting: Emergency Medicine

## 2016-11-23 DIAGNOSIS — N764 Abscess of vulva: Secondary | ICD-10-CM | POA: Insufficient documentation

## 2016-11-23 DIAGNOSIS — E1169 Type 2 diabetes mellitus with other specified complication: Secondary | ICD-10-CM

## 2016-11-23 MED ORDER — SULFAMETHOXAZOLE-TRIMETHOPRIM 800-160 MG PO TABS *I*
1.0000 | ORAL_TABLET | Freq: Two times a day (BID) | ORAL | 0 refills | Status: AC
Start: 2016-11-23 — End: 2016-12-03

## 2016-11-23 NOTE — Discharge Instructions (Signed)
Frequent warm moist compresses on area for 15 minutes at a time every 2-4 hours as able/when awake.    Wash gently with soap and water, rinse well, pat dry twice a day.    Take antibiotics with food and for the entire course.    If you develop a fever, chills, joint pain, joint swelling, worsening symptoms despite the medications please do not hessitate to go to the Emergency Room for urgent evaluation and management. As we discussed you may require another procedure to drain any further pus from area.    Follow up with GYN for further evaluation and management    Take ibuprofen as needed for pain and swelling- please keep this in mind that this medication will also mask a fever though.     Follow up with your primary care provider for a wound recheck in 3 days.    Change pad frequently (every 2 hours) to avoid increased risk for infection.

## 2016-11-23 NOTE — UC Provider Note (Signed)
History     Chief Complaint   Patient presents with    Vaginal Pain     Pt noticed vaginal cyst yesterday afternoon. Pt does have a hx of these with concer d/t diabetes. pt reports temp 101.5 took tylenol & ibuprofen with good effect.  Pt is concnerned for the size in the past when they have been this large she was admitted to ED obs for IV antibiotics.     HPI Comments: 41 year old female with past medical history significant for diabetes, bipolar, anxiety, fibromyalgia, and POTS presents complaining of abscess along vaginal region that she first noticed yesterday afternoon.  Patient states has a history with these due to her diabetes.  Patient reports had a fever yesterday of 101.5 which responded well to Tylenol and ibuprofen.  Patient states has not had to take any ibuprofen or Tylenol more recently as fever has not returned.  Patient states in the past they had to admit her to the ED for IV antibiotics.      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 Lower abdomen with cellulitis changes, wound probed and purulent material expressed.  Had PICC line for "multiple infiltrations" of what? D/c-ed home on 10d of Augmentin 9/11.      Anginal pain     Anxiety     Arthritis     Asthma     Depression     Diabetes mellitus     Previously on SU and metformin, now diet controlled    Diverticulitis 09/2010    Dysfunctional uterine bleeding     Fibromyalgia     GERD (gastroesophageal reflux disease)     GERD (gastroesophageal reflux disease) 10/04/2016    Hyperlipidemia     Long term (current) use of insulin, Dermal Adhesed V-Go Insulin Delivery Device 10/04/2016    Migraine     Neuromuscular disorder     POTS (postural orthostatic tachycardia syndrome)     Sebaceous cyst of breast     right axilla    TMJ  click, left 01/09/453    Varicella         Patient Active Problem List   Diagnosis Code    DM (diabetes mellitus), type 2, uncontrolled E11.65    Migraine with aura G43.109    Asthma J45.909    Hyperlipemia E78.5    Fibromyalgia M79.7    Syncope and collapse- recurrent, working diagnosis vascular vs psychogenic. 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    IUD (intrauterine device) in place Z97.5    IBS (irritable bowel syndrome) K58.9    Mixed obsessional thoughts and acts F42.2    Carpal tunnel syndrome G56.00    Meralgia paresthetica of left side G57.12    No diabetic retinopathy in both eyes Z03.89    At risk for abuse of opiates Z91.89    History of repeated overdose Z91.5    Borderline personality disorder F60.3    Fatty liver disease, nonalcoholic U98.1    GERD (gastroesophageal reflux disease) X91.4    TMJ click, left N82.956    Anxiety F41.9    Bipolar 1 disorder F31.9    POTS (postural orthostatic tachycardia syndrome) R00.0, I95.1    Traumatic hemorrhage of  right cerebrum S06.349A    Diverticulitis K57.92    Long term (current) use of insulin, Dermal Adhesed V-Go Insulin Delivery Device Z79.4    Morbid obesity with BMI of 40.0-44.9, adult E66.01, Z68.41    Hematuria R31.9            Past Surgical History:   Procedure Laterality Date    APPENDECTOMY  2016    arthroscopic shoulder surgery Right 2010    Related to lifting    CARDIAC CATHETERIZATION  09/2011    negative    COLON SURGERY      DILATION AND CURETTAGE OF UTERUS  2005, 2007    x2 for menorraghia    LEFT COLECTOMY  01/03/14    Dr Tresa Res    loop recorder  Feb 03 2014    loop recorder removal      PR COLONOSCOPY THRU COLOTOMY N/A 02/16/2016    Procedure: COLONOSCOPY;  Surgeon: Kelly Splinter, MD;  Location: [facility];  Service: GI    PR CYSTOURETHROSCOPY,BIOPSY N/A 10/17/2016    Procedure: CYSTOSCOPY BLADDER ;  Surgeon: Ed Blalock,  MD;  Location: [facility];  Service: Urology    PR EDG TRANSORAL BIOPSY SINGLE/MULTIPLE N/A 10/29/2016    Procedure: EGD;  Surgeon: Kelly Splinter, MD;  Location: [facility];  Service: GI    TONSILLECTOMY       Family History   Problem Relation Age of Onset    Hypertension Father     Diabetes Father     Kidney Disease Father     Elevated lipids Father     Heart attack Father 58    Other Father      PVD    Heart Disease Father     Hypertension Mother     Elevated lipids Mother     Heart attack Mother 65    Diabetes Mother     Eczema Mother     Psoriasis Mother     Heart Disease Mother     Hypertension Brother     Heart Disease Brother 8     prinzmetal's angina    Heart Disease Sister      currently having work up    [place] Sister     Hypertension Sister     Breast cancer Maternal Grandmother     Stroke Other     Cancer Sister     Thyroid disease Sister     Anesth problems Neg Hx           Social History   Substance Use Topics    Smoking status: Former Smoker     Packs/day: 0.50     Years: 3.00     Types: Cigarettes    Smokeless tobacco: Never Used      Comment: quit age late 75s    Alcohol use 0.0 oz/week     0 Standard drinks or equivalent per week      Comment: 0-1 drink/ month     Living Situation     Questions Responses    Patient lives with Significant Other    Comment: Lives with Wife, Shari     Homeless No    Caregiver for other family member No    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 Negative for diaphoresis and fatigue.   Eyes: Negative for redness.   Gastrointestinal: Negative for abdominal pain.   Genitourinary: Positive for vaginal pain. Negative for dysuria, flank pain, genital sores and urgency.   Musculoskeletal: Negative for arthralgias and myalgias.   Neurological:  Negative for dizziness, light-headedness and headaches.       Physical Exam   Triage Vitals  Triage Start: Start, (11/23/16 1901)   First Recorded BP: 134/85, Resp: 18, Temp: 36.7 C (98.1 F), Temp src: TEMPORAL Oxygen Therapy SpO2: 99 %, Oximetry Source: Lt Hand, O2 Device: None (Room air), Heart Rate: 99, (11/23/16 1900) Heart Rate (via Pulse Ox): 99, (11/23/16 1900).  First Pain Reported  0-10 Scale: 8, Pain Location/Orientation: Perineum, (11/23/16 1903)       Physical Exam   Constitutional: She is oriented to person, place, and time. She appears well-developed and well-nourished. No distress.   HENT:   Head: Normocephalic and atraumatic.   Eyes: Conjunctivae are normal. Pupils are equal, round, and reactive to light. No scleral icterus.   Neck: Normal range of motion. Neck supple.   Cardiovascular: Intact distal pulses.    Pulmonary/Chest: Effort normal.   Abdominal: There is no CVA tenderness.   Genitourinary: Vagina normal.       Pelvic exam was performed with patient supine. There is tenderness on the right labia. There is no tenderness on the left labia.   Genitourinary Comments: Harvie Junior RN present for procedure as chaperone    Fluctuance mass approx size of quarter palpable along right inferior labia majora    Well demarcated palpable cyst     No bartholin's gland cyst noted at time of exam   Neurological: She is alert and oriented to person, place, and time.   Skin: Skin is warm and dry. She is not diaphoretic.   Psychiatric: She has a normal mood and affect. Her behavior is normal. Judgment and thought content normal.   Nursing note and vitals reviewed.       Medical Decision Making      Amount and/or Complexity of Data Reviewed  Tests in the medicine section of CPT: reviewed and ordered        Initial Evaluation:  ED First Provider Contact     Date/Time Event User Comments    11/23/16 1902 ED First Provider Contact Vedansh Kerstetter Initial Face to Face Provider Contact          Patient was seen on:  11/23/2016        Assessment:  41 y.o.female comes to the [facility] with painful swollen mass along right side of vulva.    Differential Diagnosis includes:  Cyst, abscess, cellulitis    Discussed due to location and lack of medications to administer to help with pain management (patient states can't take toradol as it caused a phlebitis and itching along area where they administered in the past)  discussed further evaluation and management in the emergency room.  Patient requesting attempt to be done here in the office with use of local infiltration of lidocaine.  Patient tearful through first part of procedure however patient adamant that we continue. Harvie Junior RN present also offering to stop procedure at any time. Patient tolerated procedure well after local infiltration of lidocaine was complete.     Plan:   Orders Placed This Encounter    Incision and drainage    ED/UC REFERRAL TO OBGYN  sulfamethoxazole-trimethoprim (BACTRIM DS,SEPTRA DS) 800-160 MG per tablet           Final Diagnosis    ICD-10-CM ICD-9-CM   1. Abscess of labia majora N76.4 616.4       Encourage fluids, encourage rest, good hand hygiene.    Use over the counter medications as discussed.    Please start the new medications as below:    Current Discharge Medication List      New Medications    Details Last Dose Given Next Dose Due Script Given?   sulfamethoxazole-trimethoprim (BACTRIM DS,SEPTRA DS) 1 tablet Dose: 1 tablet  Take 1 tablet by mouth 2 times daily  Quantity 20 tablet, Refill 0  Start date: 11/23/2016, End date: 12/03/2016       Comments: Emergency Encounter                   Please follow up with your physician as below:    Follow-up Information     Follow up with [name], MD In 3 days.    Specialties:  Primary Care, Family Medicine    Why:  For wound re-check    Contact information:    [address]  [address]  [place] [address]  [phone]          Thank you Leata Mouse for coming to [facility] for  your health care concerns.    If your condition changes and/or worsens please follow up with her primary doctor and/or return to the urgent care center.    If short of breath, chest pains or any other concerns please report to the emergency room.    In the event of an Emergency dial 911.      Final Diagnosis  Final diagnoses:   [N76.4] Abscess of labia majora (Primary)         Robyne Askew, NP    Collaborating physician Allayne Gitelman, MD was immediately available          Delanna Ahmadi Beverely Risen, NP  11/23/16 2004

## 2016-11-23 NOTE — ED Procedure Documentation (Signed)
Procedures   Incision and drainage  Date/Time: 11/23/2016 7:49 PM  Performed by: Robyne Askew  Authorized by: Robyne Askew     Consent:     Consent obtained:  Verbal    Consent given by:  Patient    Risks discussed:  Bleeding, incomplete drainage, infection, pain and damage to other organs    Alternatives discussed:  Alternative treatment and observation  Universal protocol:     Procedure explained and questions answered to patient or proxy's satisfaction: yes      Relevant documents present and verified: yes      Test results available and properly labeled: yes      Immediately prior to procedure a time out was called: yes      Patient identity confirmed:  Verbally with patient  Location:     Type:  Abscess    Size:  Quarter sized    Location:  Anogenital    Anogenital location:  Vulva  Pre-procedure details:     Skin preparation:  Betadine  Anesthesia (see MAR for exact dosages):     Anesthesia method:  Local infiltration    Local anesthetic:  Lidocaine 1% WITH epi  Procedure details:     Incision types:  Single straight    Incision depth:  Subcutaneous    Scalpel blade:  11    Wound management:  Probed and deloculated and irrigated with saline    Drainage:  Bloody and purulent    Drainage amount:  Moderate    Wound treatment:  Wound left open    Packing materials:  None  Post-procedure details:     Patient tolerance of procedure:  Tolerated well, no immediate complications        Robyne Askew, NP     Robyne Askew, NP  11/23/16 1950

## 2016-11-25 ENCOUNTER — Telehealth: Payer: Self-pay

## 2016-11-25 NOTE — Telephone Encounter (Signed)
Received a request for a blood glucose monitor for [employer] meter. Per the request the insurance does not cover the [facility] but will cover the Freestyle lite. Please send to the pharmacy and call the pharmacy at [phone] with any questions or concerns. Thank you!

## 2016-11-25 NOTE — Telephone Encounter (Signed)
Pt already has that meter therefore doesn't need refill.    [name], MD  [facility]  11/25/2016  5:04 PM

## 2016-11-25 NOTE — Telephone Encounter (Signed)
Routed to provider

## 2016-11-25 NOTE — Telephone Encounter (Signed)
Kaylee Harris was discharged from [place] urgent care on 11/23/16 no follow up appointment has been scheduled. Patient will call the office Wednesday of this week to if vaginal cyst does not improve.

## 2016-11-27 ENCOUNTER — Ambulatory Visit
Admission: RE | Admit: 2016-11-27 | Discharge: 2016-11-27 | Disposition: A | Discharge status: Home / Self Care | Payer: Medicare (Managed Care) | Source: Ambulatory Visit | Attending: Primary Care | Admitting: Primary Care

## 2016-11-27 DIAGNOSIS — R935 Abnormal findings on diagnostic imaging of other abdominal regions, including retroperitoneum: Secondary | ICD-10-CM | POA: Insufficient documentation

## 2016-11-27 DIAGNOSIS — R932 Abnormal findings on diagnostic imaging of liver and biliary tract: Secondary | ICD-10-CM

## 2016-11-27 DIAGNOSIS — K7689 Other specified diseases of liver: Secondary | ICD-10-CM

## 2016-11-27 LAB — POCT CREATININE
Creatinine, POCT: 0.8 mg/dL (ref 0.51–0.95)
GFR,Black POC: 105 *
GFR,Other POC: 91 *

## 2016-11-27 MED ORDER — GADOXETATE DISODIUM 0.25 MOL/L (EOVIST) IV SOLN *I*
10.0000 mL | Freq: Once | INTRAVENOUS | Status: AC
Start: 2016-11-27 — End: 2016-11-27
  Administered 2016-11-27: 20:00:00 10 mL via INTRAVENOUS

## 2016-11-28 ENCOUNTER — Other Ambulatory Visit: Payer: Self-pay

## 2016-12-02 ENCOUNTER — Telehealth: Payer: Self-pay | Admitting: Pulmonary and Critical Care Medicine

## 2016-12-02 ENCOUNTER — Encounter: Payer: Self-pay | Admitting: Primary Care

## 2016-12-02 NOTE — Telephone Encounter (Signed)
Spoke with patient about MRI result, consistent with NAFLD. Pt advised to work on weight-loss/medetaranian diet and to avoid ETOH. She verbalized understanding. NG

## 2016-12-02 NOTE — Telephone Encounter (Signed)
Reviewed by Verita Lamb, FNP, see TE from 12/02/2016   Nelta Numbers, MD

## 2016-12-02 NOTE — Telephone Encounter (Signed)
Patient calling to find out about MRI result.  Results are in patients imaging tab.  She is very anxious to hear.  Please call her at [phone]

## 2016-12-04 ENCOUNTER — Other Ambulatory Visit: Payer: Self-pay | Admitting: Psychiatry

## 2016-12-04 ENCOUNTER — Encounter: Payer: Self-pay | Admitting: Primary Care

## 2016-12-04 ENCOUNTER — Encounter: Payer: Self-pay | Admitting: Psychiatry

## 2016-12-04 MED ORDER — DULOXETINE HCL 20 MG PO CPEP *I*
DELAYED_RELEASE_CAPSULE | ORAL | 0 refills | Status: DC
Start: 2016-12-04 — End: 2017-01-07

## 2016-12-04 MED ORDER — CETIRIZINE HCL 10 MG PO TABS *I*
10.0000 mg | ORAL_TABLET | Freq: Every day | ORAL | 1 refills | Status: DC
Start: 2016-12-04 — End: 2017-06-10

## 2016-12-05 ENCOUNTER — Emergency Department
Admission: EM | Admit: 2016-12-05 | Discharge: 2016-12-05 | Discharge status: Against Medical Advice | Payer: Medicare (Managed Care)

## 2016-12-05 ENCOUNTER — Ambulatory Visit
Admission: AD | Admit: 2016-12-05 | Discharge: 2016-12-05 | Disposition: A | Discharge status: Other Acute Inpatient Hospital | Payer: Medicare (Managed Care) | Source: Ambulatory Visit | Attending: Emergency Medicine | Admitting: Emergency Medicine

## 2016-12-05 ENCOUNTER — Telehealth: Payer: Self-pay

## 2016-12-05 DIAGNOSIS — K920 Hematemesis: Secondary | ICD-10-CM | POA: Insufficient documentation

## 2016-12-05 DIAGNOSIS — R1031 Right lower quadrant pain: Secondary | ICD-10-CM

## 2016-12-05 DIAGNOSIS — G43719 Chronic migraine without aura, intractable, without status migrainosus: Secondary | ICD-10-CM | POA: Insufficient documentation

## 2016-12-05 MED ORDER — FAMOTIDINE 20 MG PO TABS *I*
40.0000 mg | ORAL_TABLET | Freq: Once | ORAL | Status: AC
Start: 2016-12-05 — End: 2016-12-05
  Administered 2016-12-05: 40 mg via ORAL

## 2016-12-05 MED ORDER — DIPHENHYDRAMINE HCL 25 MG PO TABS *I*
25.0000 mg | ORAL_TABLET | Freq: Once | ORAL | Status: AC
Start: 2016-12-05 — End: 2016-12-05
  Administered 2016-12-05: 25 mg via ORAL

## 2016-12-05 MED ORDER — PROMETHAZINE HCL 25 MG/ML IJ SOLN *I*
12.5000 mg | Freq: Once | INTRAMUSCULAR | Status: AC
Start: 2016-12-05 — End: 2016-12-05
  Administered 2016-12-05: 12.5 mg via INTRAVENOUS

## 2016-12-05 NOTE — ED Triage Notes (Signed)
Complains of a migraine headache and nausea that started this morning.  Did take immitrex PO and SQ without relief.       Triage Note   Manus Gunning, RN

## 2016-12-05 NOTE — Telephone Encounter (Signed)
Patient called stated she had a Migraine 1st thing this morning Patient has had this all day she stated she just Starting throwing up  And she noticed Quarter sized clots she also mentioned she has had  Stomach issues  Would like to know what she should do. Please call patient at [phone]

## 2016-12-05 NOTE — ED Notes (Signed)
12/05/16 1712   Expected Call-In Information   ED Service [facility]   PCP/Service Referral Wynn Maudlin, PA   Pt Info note/Reason for sending Migraine;vomiting;emesis   Pt Coming from Urgent Care   Interventions prior to arrival (pepcid, benadryl, phenergan)   Call Back number [phone]   Call reported to April Lee

## 2016-12-05 NOTE — ED Notes (Signed)
Called to place pt into treatment room , pt not in waiting room

## 2016-12-05 NOTE — ED Triage Notes (Signed)
migraine today , bno relief w meds  Went to urgent care, given phenergan, benadryl and pepcid w some relief . Pt state she vomited earlier with quarter size clots.    Triage Note   Tammi Klippel, RN

## 2016-12-05 NOTE — UC Provider Note (Signed)
History     Chief Complaint   Patient presents with    Headache     HPI Comments: Kaylee Harris is a 41 yo female with hx of Diabetes, bipolar, anxiety, fibromyalgia, POTS, migraines, asthma, HTN, HLD, GERD who presents with a migraine since this morning.     Pt reports she woke up this morning feeling okay then developed severe migraine. She reports it goes from her right temple down the right side of her neck. Her usual migraines are just at her temple. Pt states she took her imitrex pill at 1100 and imitrex shot at 1300 with minimal improvement. Pt reports 2 episodes of emesis this morning with multiple quarter sized blood clots. Pt reports this is unusual for her and she reports RLQ abdominal pain associated with this. She reports only having a muffin for breakfast this morning. She denies light headedness, numbness, tingling, stiff neck, fever, chills, melena, hematochezia.       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 Lower abdomen with cellulitis changes, wound probed and purulent material expressed.  Had PICC line for "multiple infiltrations" of what? D/c-ed home on 10d of Augmentin 9/11.      Anginal pain     Anxiety     Arthritis     Asthma     Depression     Diabetes mellitus     Previously on SU and metformin, now diet controlled    Diverticulitis 09/2010    Dysfunctional uterine bleeding     Fibromyalgia     GERD (gastroesophageal reflux disease)     GERD (gastroesophageal reflux disease) 10/04/2016    High blood pressure     Hyperlipidemia     Liver disease     fatty liver     Long term (current) use of insulin, Dermal Adhesed V-Go Insulin Delivery Device 10/04/2016    Migraine     Neuromuscular disorder     POTS (postural orthostatic tachycardia syndrome)     Sebaceous cyst of breast     right axilla    TMJ  click, left 08/08/4096    Varicella         Patient Active Problem List   Diagnosis Code    DM (diabetes mellitus), type 2, uncontrolled E11.65    Migraine with aura G43.109    Asthma J45.909    Hyperlipemia E78.5    Fibromyalgia M79.7    Syncope and collapse- recurrent, working diagnosis vascular vs psychogenic. 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    IUD (intrauterine device) in place Z97.5    IBS (irritable bowel syndrome) K58.9    Mixed obsessional thoughts and acts F42.2    Carpal tunnel syndrome G56.00    Meralgia paresthetica of left side G57.12    No diabetic retinopathy in both eyes Z03.89    At risk for abuse of opiates Z91.89    History of repeated overdose Z91.5    Borderline personality disorder F60.3    Fatty liver disease, nonalcoholic J19.1    GERD (gastroesophageal reflux disease) Y78.2    TMJ click, left N56.213    Anxiety F41.9    Bipolar 1 disorder F31.9    POTS (postural orthostatic tachycardia syndrome) R00.0, I95.1  Traumatic hemorrhage of right cerebrum S06.349A    Diverticulitis K57.92    Long term (current) use of insulin, Dermal Adhesed V-Go Insulin Delivery Device Z79.4    Morbid obesity with BMI of 40.0-44.9, adult E66.01, Z68.41    Hematuria R31.9            Past Surgical History:   Procedure Laterality Date    APPENDECTOMY  2016    arthroscopic shoulder surgery Right 2010    Related to lifting    CARDIAC CATHETERIZATION  09/2011    negative    COLON SURGERY      DILATION AND CURETTAGE OF UTERUS  2005, 2007    x2 for menorraghia    LEFT COLECTOMY  01/03/14    Dr Tresa Res    loop recorder  Feb 03 2014    loop recorder removal      PR COLONOSCOPY THRU COLOTOMY N/A 02/16/2016    Procedure: COLONOSCOPY;  Surgeon: Kelly Splinter, MD;  Location: [facility];  Service: GI    PR CYSTOURETHROSCOPY,BIOPSY N/A 10/17/2016    Procedure: CYSTOSCOPY BLADDER ;  Surgeon: Ed Blalock,  MD;  Location: [facility];  Service: Urology    PR EDG TRANSORAL BIOPSY SINGLE/MULTIPLE N/A 10/29/2016    Procedure: EGD;  Surgeon: Kelly Splinter, MD;  Location: [facility];  Service: GI    TONSILLECTOMY       Family History   Problem Relation Age of Onset    Hypertension Father     Diabetes Father     Kidney Disease Father     Elevated lipids Father     Heart attack Father 5    Other Father      PVD    Heart Disease Father     Hypertension Mother     Elevated lipids Mother     Heart attack Mother 24    Diabetes Mother     Eczema Mother     Psoriasis Mother     Heart Disease Mother     Hypertension Brother     Heart Disease Brother 23     prinzmetal's angina    Heart Disease Sister      currently having work up    [place] Sister     Hypertension Sister     Breast cancer Maternal Grandmother     Stroke Other     Cancer Sister     Thyroid disease Sister     Anesth problems Neg Hx           Social History   Substance Use Topics    Smoking status: Former Smoker     Packs/day: 0.50     Years: 3.00     Types: Cigarettes    Smokeless tobacco: Never Used      Comment: quit age late 67s    Alcohol use 0.0 oz/week     0 Standard drinks or equivalent per week      Comment: 0-1 drink/ month     Living Situation     Questions Responses    Patient lives with Significant Other    Comment: Lives with Wife, Shari     Homeless No    Caregiver for other family member No    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 Negative for activity change, appetite change, chills and fever.   HENT: Negative for congestion and tinnitus.    Eyes: Positive for photophobia. Negative for visual disturbance.   Respiratory: Negative for cough, chest tightness and shortness of breath.    Cardiovascular: Negative for chest pain.    Gastrointestinal: Positive for abdominal pain (RLQ), nausea and vomiting. Negative for abdominal distention, blood in stool and diarrhea.   Genitourinary: Negative for hematuria.   Musculoskeletal: Negative for back pain, myalgias, neck pain and neck stiffness.   Skin: Negative for color change, pallor and rash.   Neurological: Positive for headaches. Negative for dizziness, weakness, light-headedness and numbness.   Psychiatric/Behavioral: The patient is not nervous/anxious.        Physical Exam   Triage Vitals  Triage Start: Start, (12/05/16 1611)   First Recorded BP: 141/85, Resp: 17, Temp: 37.1 C (98.8 F), Temp src: TEMPORAL Oxygen Therapy SpO2: 98 %, Oximetry Source: Rt Hand, O2 Device: None (Room air), Heart Rate: 80, (12/05/16 1604)  .  First Pain Reported  0-10 Scale: 8, Pain Location/Orientation: Head, (12/05/16 1604)       Physical Exam   Constitutional: She appears well-developed and well-nourished. No distress.   Pt sitting in dark room with towel over face   HENT:   Head: Normocephalic and atraumatic.   Right Ear: External ear normal.   Left Ear: External ear normal.   Eyes: Conjunctivae and EOM are normal. Pupils are equal, round, and reactive to light.   Neck: Normal range of motion. Neck supple.   No stiffness    Cardiovascular: Normal rate, regular rhythm and normal heart sounds.    Abdominal: Soft. Bowel sounds are normal. There is tenderness.   Diffuse abdominal tenderness, no guarding, no rebound tenderness, no distension   Musculoskeletal: Normal range of motion.   Lymphadenopathy:     She has no cervical adenopathy.   Skin: She is not diaphoretic.   Nursing note and vitals reviewed.       Medical Decision Making        Initial Evaluation:  ED First Provider Contact     Date/Time Event User Comments    12/05/16 1604 ED First Provider Contact Wynn Maudlin Initial Face to Face Provider Contact          Patient was seen on: 12/05/2016    Assessment:  40 y.o.female comes to the [facility]  with migraine and blood clots in emesis since this morning. Pt hemodynamically stable. No signs of meningismus. History of blood clots in emesis concerning.     Differential Diagnosis includes: intractable migraine, cluster headache, upper GI bleed, gastritis, esophagitis, peptic ulcer, duodenal ulcer, esophageal varices     Plan:   - Benadryl, phenergan for migraine (pt allergic to toradol)  - Pepcid 40 mg for history of possible blood clot in emesis  - Pt friend will take her to [place] for further workup, amenable to plan. Pt hemodynamically stable and asymptomatic (no light headedness, dizziness, palor, emesis) at time of discharge. Able to be transported by private vehicle.     Final Diagnosis  Final diagnoses:   [U20.254] Intractable chronic migraine without aura and without status migrainosus (Primary)   [K92.0] Hematemesis with nausea     Wynn Maudlin, [place]    Supervising physician Geraldine Contras, MD was immediately available          Ruven Corradi, Gladis Riffle, PA  12/05/16 1705

## 2016-12-05 NOTE — Discharge Instructions (Signed)
Please drive directly to [facility] were given benadryl, phenergan, and pepcid while at urgent care.

## 2016-12-05 NOTE — Telephone Encounter (Signed)
Per conversation with Dr. Laurance Flatten, pt advised to monitor for now.  If vomiting a large amount of blood clot (palm sized) she is to go to the ED.  Pt expresses understanding.  States she is currently resting.

## 2016-12-09 ENCOUNTER — Telehealth: Payer: Self-pay

## 2016-12-09 ENCOUNTER — Ambulatory Visit: Payer: Medicare (Managed Care)

## 2016-12-09 ENCOUNTER — Ambulatory Visit: Payer: Medicare (Managed Care) | Admitting: Psychiatry

## 2016-12-09 NOTE — Telephone Encounter (Signed)
Patient called to cancel today's appt 12/09/16 @2 :00PM with [name] (Due to [facility]) and reschedule 12/16/16 @3 :40PM. Pt also cancel appt 12/30/16 @11 :15AM with Tresa Res and reschedule 01/13/17 @3 :00PM.

## 2016-12-09 NOTE — Telephone Encounter (Signed)
Patient called to cancel appointment on 8/6 at 2:45pm and re-schedule for 8/27 at 11:15am, due to family member passing away.

## 2016-12-16 ENCOUNTER — Encounter: Payer: Self-pay | Admitting: Psychiatry

## 2016-12-16 ENCOUNTER — Ambulatory Visit: Payer: Medicare (Managed Care) | Attending: Psychiatry | Admitting: Psychiatry

## 2016-12-16 VITALS — BP 131/80 | HR 86 | Ht 67.99 in | Wt 282.0 lb

## 2016-12-16 DIAGNOSIS — F3341 Major depressive disorder, recurrent, in partial remission: Secondary | ICD-10-CM | POA: Insufficient documentation

## 2016-12-16 DIAGNOSIS — F422 Mixed obsessional thoughts and acts: Secondary | ICD-10-CM

## 2016-12-16 DIAGNOSIS — F4312 Post-traumatic stress disorder, chronic: Secondary | ICD-10-CM | POA: Insufficient documentation

## 2016-12-16 DIAGNOSIS — F603 Borderline personality disorder: Secondary | ICD-10-CM | POA: Insufficient documentation

## 2016-12-16 NOTE — Progress Notes (Signed)
Behavioral Health Psychopharmacology Follow-up     Length of Session: 20 minutes.    Diagnosis Addressed    ICD-10-CM ICD-9-CM   1. Borderline personality disorder F60.3 301.83   2. 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 Mixed obsessional thoughts and acts F42.2 300.3   4. Chronic post-traumatic stress disorder (PTSD) F43.12 309.81       Recent History and Response to Medications  Patient states: She has not noticed any difference either with her mood or her pain with the decrease in Cymbalta. She has been having trouble with migraine headaches but now has inject ible Imitrex which usually works for her.  It became so severe and she cold not stop vomiting to the pint where she was vomiting up blood.  Patient says she is doing well on her current medications.  She has had some significant stressors as her wife's grandmother passed away las week and her father is currently hospitalized for pneumonia and CHF.  She says her sleep has been a little off where she has trouble both falling and staying asleep. She has been waking some nightmares.  Marlana Salvage says the mirtazapine is not working as well.     Current use of alcohol or drugs: No      Neurovegetative Symptoms Review:  Energy level: fair  Concentration: fair  Sleep Quality: fair   Number of hours : 5  Appetite: fair     Wt Readings from Last 3 Encounters:   12/16/16 127.9 kg (282 lb)   12/05/16 128.4 kg (283 lb)   12/05/16 128.4 kg (283 lb)     Enjoyment/interest: fair    Current Medications  Current Outpatient Prescriptions   Medication Sig    SUMAVEL DOSEPRO 6 MG/0.5ML needle-free injection INJECT 0.5MLS (6MG TOTAL) UNDER THE SKIN AS NEEDED FOR MIGRAINE MAY REPEAT ONCE AFTER 1 HOUR IF NEEDED    DULoxetine (CYMBALTA) 20 MG DR capsule Take 2 capsules every morning.    cetirizine (ZYRTEC) 10 MG tablet Take 1 tablet (10 mg total) by mouth daily    continuous blood glucose monitor (FREESTYLE LIBRE) reader Use with Freestyle  Libre CGM sensor    continuous blood glucose monitor (FREESTYLE LIBRE) sensor Apply sensor to back of upper arm. Replace sensor every 10 days.    diazepam (VALIUM) 5 MG tablet Take 1 tablet 1 hour prior to MRI. Can repeat in 70mn if still anxious    pantoprazole (PROTONIX) 40 MG EC tablet Take 1 tablet (40 mg total) by mouth daily   SWALLOW WHOLE. DO NOT CRUSH, BREAK, OR CHEW.    topiramate (TOPAMAX) 100 MG tablet Take 1 tablet (100 mg total) by mouth 2 times daily    traMADol (ULTRAM) 50 MG tablet Take 1 tablet (50 mg total) by mouth every 8 hours as needed (for head pain and abd pain)   Max daily dose: 150 mg    atorvastatin (LIPITOR) 40 MG tablet Take 1 tablet (40 mg total) by mouth daily (with dinner)    promethazine (PHENERGAN) 12.5 MG tablet Take 1 tablet (12.5 mg total) by mouth 4 times daily as needed    albuterol HFA 108 (90 Base) MCG/ACT inhaler Inhale 1-2 puffs into the lungs every 6 hours as needed for Wheezing   Shake well before each use.    fluticasone (FLONASE) 50 MCG/ACT nasal spray 1 spray by Nasal route daily    insulin lispro 100 UNIT/ML injection vial For VGO: Dinner 151O(6 clicks) ;  B & L 4- 6 units. BG 100 - 150 8u (4clk), 151 - 200 10U (5clk), >201 12U ( 6 ck). MDD 72U (36 clk)    SUMAtriptan (IMITREX) 50 MG tablet Take 1 tablet (50 mg total) by mouth as needed for Migraine   Take at onset of headache. May repeat once in 2 hours.    lamoTRIgine (LAMICTAL) 150 MG tablet Take 1 tablet (150 mg total) by mouth daily    phenazopyridine (PYRIDIUM) 100 MG tablet Take 1 tablet (100 mg total) by mouth every 8 hours as needed    busPIRone (BUSPAR) 30 MG tablet Take 1 tablet (30 mg total) by mouth 2 times daily    mirtazapine (REMERON) 15 MG tablet Take 1 tablet (15 mg total) by mouth nightly    famciclovir (FAMVIR) 500 MG tablet Take 1 tablet (500 mg total) by mouth 3 times daily as needed    Insulin Disposable Pump (V-GO 30) KIT Use daily; 30ct/month.    dulaglutide (TRULICITY) 1.5  JJ/0.0XF injection pen Inject 0.5 mLs (1.5 mg total) into the skin every 7 days    clonazePAM (KLONOPIN) 0.5 MG tablet Take 1 tablet (0.5 mg total) by mouth daily as needed   Max daily dose: 0.5 mg    metoprolol (TOPROL-XL) 25 MG 24 hr tablet Take by mouth daily       insulin glargine 300 UNIT/ML SOPN Inject 30 Units into the skin nightly   FREE voucher will be used; DO NOT Buyer, retail (Patient taking differently: Inject 30 Units into the skin nightly   Only when V-Go not being used; FREE voucher will be used; DO NOT Buyer, retail)    ascorbic acid (VITAMIN C) 100 MG tablet Take 100 mg by mouth daily    midodrine (PROAMATINE) 10 MG tablet Take 1 tablet (10 mg total) by mouth 3 times daily    insulin syringe-needle U-100 (BD ULTRAFINE) 31G X 5/16" 0.3 ML HALF-UNIT Use 3 times a day as instructed.    FREESTYLE LITE test strip Use four times daily as directed for 250.02    blood glucose monitor system Brand: cheapest brand available per her insurance.  Use as directed.    lancets Brand Free Style Lite; Use 2 times per day as directed for blood glucose testing.    Alcohol Swabs (ALCOHOL WIPES) PADS Use BID for BG check    levonorgestrel (MIRENA) 20 MCG/24HR IUD 1 each by Intrauterine route once    Non-System Medication The above patient is followed in our clinic and cannot resume work permanently.     No current facility-administered medications for this visit.        Side Effects  Patient Reported Side Effects: None reported    Mental Status  APPEARANCE: Casual  ATTITUDE TOWARD INTERVIEWER: Cooperative  MOTOR ACTIVITY: WNL (within normal limits)  EYE CONTACT: Direct  SPEECH: Normal rate and tone  AFFECT: Full Range and Appropriate  MOOD: Euthymic  THOUGHT PROCESS: Normal  THOUGHT CONTENT: No unusual themes  PERCEPTION: Within normal limits  ORIENTATION: Alert and Oriented X 3.  CONCENTRATION: Fair  MEMORY:   Recent: intact   Remote: intact  COGNITIVE FUNCTION: Average intelligence  JUDGMENT:  Intact  IMPULSE CONTROL: Poor in terms of overeating but otherwise is good.   INSIGHT: Fair    Risk Assessment    Self Injury: Patient Denies  Suicidal Ideation: Patient Denies  Homicidal Ideation: Patient Denies  Aggressive Behavior: Patient Denies    Results  none    BP  Readings from Last 3 Encounters:   12/16/16 131/80   12/05/16 (!) 142/92   12/05/16 141/85       Assessment  FORMULATION: Patient has some significant situational stressors and overall is doing well with them except for a little difficulty with sleep and stress eating.  There does not seem to be any significant change in her mood with the decrease in Cymbalta but since has so much stress, writer suggested we hold at the current dose.  We discussed that higher that higher doses of mirtazapine do not help with sleep and that she could try taking half a table to see if that works better for her.  The other Lamictal and Buspar have both been helpful in keeping her mood stable and she has not done as well when the doses were lowered.      Recommendations/Plan and Rationale  PLAN: Patient can try taking 1/2 tablet of the mirtazapine to see if that works better for sleep.  Continue other medications.  Return in 4 weeks.

## 2016-12-19 ENCOUNTER — Other Ambulatory Visit: Payer: Self-pay | Admitting: Primary Care

## 2016-12-19 MED ORDER — INSULIN GLARGINE 300 UNIT/ML SC SOPN *I*
30.0000 [IU] | PEN_INJECTOR | Freq: Every evening | SUBCUTANEOUS | 0 refills | Status: DC
Start: 2016-12-19 — End: 2017-02-14

## 2016-12-26 ENCOUNTER — Encounter: Payer: Self-pay | Admitting: Primary Care

## 2016-12-30 ENCOUNTER — Ambulatory Visit: Payer: Medicare (Managed Care)

## 2017-01-04 ENCOUNTER — Other Ambulatory Visit: Payer: Self-pay | Admitting: Psychiatry

## 2017-01-07 ENCOUNTER — Other Ambulatory Visit: Payer: Self-pay | Admitting: Psychiatry

## 2017-01-07 MED ORDER — DULOXETINE HCL 20 MG PO CPEP *I*
DELAYED_RELEASE_CAPSULE | ORAL | 0 refills | Status: DC
Start: 2017-01-07 — End: 2017-02-17

## 2017-01-07 MED ORDER — MIRTAZAPINE 15 MG PO TABS *I*
15.0000 mg | ORAL_TABLET | Freq: Every evening | ORAL | 2 refills | Status: DC
Start: 2017-01-07 — End: 2017-05-04

## 2017-01-13 ENCOUNTER — Ambulatory Visit: Payer: Medicare (Managed Care)

## 2017-01-20 ENCOUNTER — Ambulatory Visit: Payer: Medicare (Managed Care) | Attending: Psychiatry | Admitting: Psychiatry

## 2017-01-20 ENCOUNTER — Encounter: Payer: Self-pay | Admitting: Psychiatry

## 2017-01-20 ENCOUNTER — Encounter: Payer: Self-pay | Admitting: Primary Care

## 2017-01-20 ENCOUNTER — Ambulatory Visit: Payer: Medicare (Managed Care)

## 2017-01-20 VITALS — BP 127/75 | HR 78 | Resp 17 | Ht 67.99 in | Wt 282.0 lb

## 2017-01-20 DIAGNOSIS — M797 Fibromyalgia: Secondary | ICD-10-CM

## 2017-01-20 DIAGNOSIS — F3341 Major depressive disorder, recurrent, in partial remission: Secondary | ICD-10-CM

## 2017-01-20 DIAGNOSIS — F4312 Post-traumatic stress disorder, chronic: Secondary | ICD-10-CM

## 2017-01-20 DIAGNOSIS — F422 Mixed obsessional thoughts and acts: Secondary | ICD-10-CM

## 2017-01-20 DIAGNOSIS — F603 Borderline personality disorder: Secondary | ICD-10-CM

## 2017-01-20 NOTE — Progress Notes (Signed)
Behavioral Health Progress Note     LENGTH OF SESSION: 45 minutes    Contact Type:  Location: On Site    Face to Face     Problem(s)/Goals Addressed from Treatment Plan:    Problem 1:   Treatment Problem #1 01/20/2017   Patient Identified Problem OCD       Goal for this problem:    Treatment Goal #1 01/20/2017   Patient Identified Goal Work to reduce obsessional thoughts and actions       Progress towards this goal: Problem resolving. Comment: Patient has been out of treatment for 10 weeks and reports maintaining a balanced mood.    Mental Status Exam:  APPEARANCE: Appears stated age, Well-groomed, Casual  ATTITUDE TOWARD INTERVIEWER: Cooperative  MOTOR ACTIVITY: WNL (within normal limits)  EYE CONTACT: Direct  SPEECH: Normal rate and tone  AFFECT: Neutral and Appropriate  MOOD: Lively and Neutral  THOUGHT PROCESS: Circumstantial  THOUGHT CONTENT: No unusual themes and Negative Rumination  PERCEPTION: No evidence of hallucinations  CURRENT SUICIDAL IDEATION: patient denies  CURRENT HOMICIDAL IDEATION: Patient denies  ORIENTATION: Alert and Oriented X 3.  CONCENTRATION: Good  MEMORY:   Recent: intact   Remote: intact  COGNITIVE FUNCTION: Average intelligence  JUDGMENT: Intact  IMPULSE CONTROL: Good  INSIGHT: Good    Risk Assessment:  ASSESSMENT OF RISK FOR SUICIDAL BEHAVIOR  Changes in risk for suicide from baseline Formulation of Risk and/or previous intake, including newly identified risk, if any: none  Violence risk was assessed and No Change noted from baseline formulation of risk and/or previous assessment.    Session Content::  Patient has been out of treatment for 10 weeks and reports maintaining a balanced mood. Writer and patient discussed patient's lapse in treatment. Patient told Probation officer about the deaths in the family: her spouse's grandmother and one of her cousins. Patient said that while she was away for two weeks for her cousin's funeral and assisting the family with paperwork, she managed her own  medications with no issues. Patient denied SI. Patient said that she is trying to set better boundaries with her brother and that she did not attend his birthday celebration. She said that he did not invite their parents or sister. Writer and patient discussed patient having a conversation with her brother and patient said that she is "trying". Patient said that he agreed to meet and talk but he has not followed up. Writer commended patient on her actions.     Visit Diagnosis:      ICD-10-CM ICD-9-CM   1. 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 Mixed obsessional thoughts and acts F42.2 300.3   3. Borderline personality disorder F60.3 301.83   4. Chronic post-traumatic stress disorder (PTSD) F43.12 309.81       Interventions:  Supportive Psychotherapy  Treatment Planning  Solution Focused therapy    Current Treatment Plan   Created/Updated On 01/20/2017   Next Treatment Plan Due 04/20/2017     Plan:  Psychotherapy continues as described in care plan; plan remains the same.    NEXT APPT: 02/24/17    Tresa Res, LCSW

## 2017-01-20 NOTE — BH Treatment Plan (Signed)
[facility]     Date of Plan:   Created/Updated On 01/20/2017   FROM 01/20/2017      Created/Updated On 01/20/2017   Next Treatment Plan Due 04/20/2017       Diagnostic Impression    ICD-10-CM ICD-9-CM   1. 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 Mixed obsessional thoughts and acts F42.2 300.3   3. Borderline personality disorder F60.3 301.83   4. Chronic post-traumatic stress disorder (PTSD) F43.12 309.81       Strengths  Strengths derived from the assessment include: Patient is hard working, has supports.    Problem Areas  *At least one problem must be targeted toward risk reduction if Formulation of Risk or any other previous exam indicated special monitoring or intervention for suicide and/or violence risk indicated.    PROBLEM AREAS (choose and describe relevant):  THOUGHT: Negative  MOOD:Depressed and anxious  BEHAVIOR: Reactive and avoidant  ECONOMIC:SSDI  ________________________________________________________________  Treatment Problem #1 01/20/2017   Patient Identified Problem OCD        Treatment Goal #1 01/20/2017   Patient Identified Goal Work to reduce obsessional thoughts and actions       The rationale for addressing this problem is that resolving it will (select all that apply):  Reduce symptoms of disorder, Reduce functional impairment associated with disorder, Decrease likelihood of hospitalization, Facilitate transfer skills learned in therapy to everday life, Is a key motivational factor for the patient's participation in treatment, Reduce risk for suicide* and Reduce risk for violence*      Progress toward goal(s): Problem resolving. Comment: Patient has been out of treatment with Probation officer for two months. Patient reports continued mood stabilty. Patient has some additional psychosocial stressors: her spouse's grandmother died and patient's first cousin died. Patient reports managing her own medications without SI for over two  weeks.    1 a. Measurable Objectives : Patient will make and keep regular individual therapy appointments.   Date established: 05/31/14   Target date: 04/20/17   Attained or Revised? continue    1 b. Measurable Objectives : Patient will continue to learn and utilize skills to manage affect.   Date established: 05/31/14   Target date: 04/20/17   Attained or Revised? continue    ______________________________________________________________________      Plan  TREATMENT MODALITIES:  Individual psychotherapy for 30-60 min Q 1-3 weeks with Tresa Res, LCSW.  Psychopharmacology visits 20-45 min Q 2-8 weeks with [name], NP.     DISCHARGE CRITERIA for this treatment setting: Patient reports a reduction in symptoms as evidenced by PHQ-9 and GAD-7 scores.    Clinician's name: Tresa Res, LCSW    Psychiatrist's Name: Donny Pique, MD      Patient/Family Statement  PATIENT/FAMILY STATEMENT:  Obtain patient and family input into the treatment plan, including areas of agreement / disagreement.  Obtain patient's signature - if not possible, briefly describe the reason.     Patient Comments:          I HAVE PARTICIPATED IN THE DEVELOPMENT OF THIS TREATMENT PLAN AND I AGREE WITH ITS CONTENTS:       Patient Signature: _______________________________________________________    Date: ______________________________

## 2017-01-20 NOTE — Progress Notes (Signed)
Behavioral Health Psychopharmacology Follow-up     Length of Session: 20 minutes.    Diagnosis Addressed    ICD-10-CM ICD-9-CM   1. Borderline personality disorder F60.3 301.83   2. 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 Mixed obsessional thoughts and acts F42.2 300.3   4. Chronic post-traumatic stress disorder (PTSD) F43.12 309.81     Chief Complaint:  "Its been hard dealing with the death's."    Recent History and Response to Medications  Patient states: both her wife's grandmother and then her cousin, with whom she had a close relationship, passed away.  She had to travel with family to [place] for her cousin's funeral and managed her own meds but says she did well.  Her wife is back to managing them now.  The time with her family went well.  Patient says that the half tablet of mirtazapine has not made any difference in her sleep or appetite.  Patient says "I have not passed out in three months."      HPI     Follow-up    Additional comments: patient is here for medication evaluation       Last edited by Randell Loop, RN on 01/20/2017  3:31 PM. (History)        Current use of alcohol or drugs: No      Neurovegetative Symptoms Review:  Energy level: fair  Concentration: good  Sleep Quality: poor   Number of hours : 5  Appetite: fair     Wt Readings from Last 3 Encounters:   01/20/17 127.9 kg (282 lb)   12/16/16 127.9 kg (282 lb)   12/05/16 128.4 kg (283 lb)     Enjoyment/interest: fair      Current Medications  Current Outpatient Prescriptions   Medication Sig    busPIRone (BUSPAR) 30 MG tablet TAKE 1 TABLET BY MOUTH TWO TIMES DAILY    DULoxetine (CYMBALTA) 20 MG DR capsule Take 2 capsules every morning.    mirtazapine (REMERON) 15 MG tablet Take 1 tablet (15 mg total) by mouth nightly    insulin glargine 300 UNIT/ML SOPN Inject 30 Units into the skin nightly   FREE voucher will be used; DO NOT bill Insurance    SUMAVEL DOSEPRO 6 MG/0.5ML needle-free injection  INJECT 0.5MLS (6MG TOTAL) UNDER THE SKIN AS NEEDED FOR MIGRAINE MAY REPEAT ONCE AFTER 1 HOUR IF NEEDED    cetirizine (ZYRTEC) 10 MG tablet Take 1 tablet (10 mg total) by mouth daily    continuous blood glucose monitor (FREESTYLE LIBRE) reader Use with Freestyle Libre CGM sensor    continuous blood glucose monitor (FREESTYLE LIBRE) sensor Apply sensor to back of upper arm. Replace sensor every 10 days.    pantoprazole (PROTONIX) 40 MG EC tablet Take 1 tablet (40 mg total) by mouth daily   SWALLOW WHOLE. DO NOT CRUSH, BREAK, OR CHEW.    topiramate (TOPAMAX) 100 MG tablet Take 1 tablet (100 mg total) by mouth 2 times daily    traMADol (ULTRAM) 50 MG tablet Take 1 tablet (50 mg total) by mouth every 8 hours as needed (for head pain and abd pain)   Max daily dose: 150 mg    atorvastatin (LIPITOR) 40 MG tablet Take 1 tablet (40 mg total) by mouth daily (with dinner)    promethazine (PHENERGAN) 12.5 MG tablet Take 1 tablet (12.5 mg total) by mouth 4 times daily as needed    albuterol HFA 108 (90 Base) MCG/ACT inhaler  Inhale 1-2 puffs into the lungs every 6 hours as needed for Wheezing   Shake well before each use.    fluticasone (FLONASE) 50 MCG/ACT nasal spray 1 spray by Nasal route daily    insulin lispro 100 UNIT/ML injection vial For VGO: Dinner 16X (6 clicks) ; B & L 4- 6 units. BG 100 - 150 8u (4clk), 151 - 200 10U (5clk), >201 12U ( 6 ck). MDD 72U (36 clk)    lamoTRIgine (LAMICTAL) 150 MG tablet Take 1 tablet (150 mg total) by mouth daily    Insulin Disposable Pump (V-GO 30) KIT Use daily; 30ct/month.    dulaglutide (TRULICITY) 1.5 WR/6.0AV injection pen Inject 0.5 mLs (1.5 mg total) into the skin every 7 days    clonazePAM (KLONOPIN) 0.5 MG tablet Take 1 tablet (0.5 mg total) by mouth daily as needed   Max daily dose: 0.5 mg    metoprolol (TOPROL-XL) 25 MG 24 hr tablet Take by mouth daily       ascorbic acid (VITAMIN C) 100 MG tablet Take 100 mg by mouth daily    midodrine (PROAMATINE) 10 MG  tablet Take 1 tablet (10 mg total) by mouth 3 times daily    insulin syringe-needle U-100 (BD ULTRAFINE) 31G X 5/16" 0.3 ML HALF-UNIT Use 3 times a day as instructed.    FREESTYLE LITE test strip Use four times daily as directed for 250.02    blood glucose monitor system Brand: cheapest brand available per her insurance.  Use as directed.    lancets Brand Free Style Lite; Use 2 times per day as directed for blood glucose testing.    Alcohol Swabs (ALCOHOL WIPES) PADS Use BID for BG check    levonorgestrel (MIRENA) 20 MCG/24HR IUD 1 each by Intrauterine route once    Non-System Medication The above patient is followed in our clinic and cannot resume work permanently.    SUMAtriptan (IMITREX) 50 MG tablet Take 1 tablet (50 mg total) by mouth as needed for Migraine   Take at onset of headache. May repeat once in 2 hours.    phenazopyridine (PYRIDIUM) 100 MG tablet Take 1 tablet (100 mg total) by mouth every 8 hours as needed    famciclovir (FAMVIR) 500 MG tablet Take 1 tablet (500 mg total) by mouth 3 times daily as needed     No current facility-administered medications for this visit.        Side Effects  Patient Reported Side Effects: None reported    Mental Status  APPEARANCE: Casual  ATTITUDE TOWARD INTERVIEWER: Cooperative  MOTOR ACTIVITY: WNL (within normal limits)  EYE CONTACT: Direct  SPEECH: Normal rate and tone  AFFECT: Full Range and Appropriate  MOOD: Euthymic  THOUGHT PROCESS: Normal  THOUGHT CONTENT: No unusual themes  PERCEPTION: Within normal limits  ORIENTATION: Alert and Oriented X 3.  CONCENTRATION: Good  MEMORY:   Recent: intact   Remote: intact  COGNITIVE FUNCTION: Average intelligence  JUDGMENT: Intact  IMPULSE CONTROL: Good  INSIGHT: Good    Risk Assessment    Self Injury: Patient Denies  Suicidal Ideation: Patient Denies  Homicidal Ideation: Patient Denies  Aggressive Behavior: Patient Denies    Results  PHQ-9 decreased from 8 to 6 and GAD-7 decreased from 7 to 6.      BP Readings  from Last 3 Encounters:   01/20/17 127/75   12/16/16 131/80   12/05/16 (!) 142/92       Assessment  FORMULATION: Patient was not getting any more  sleep on the 7.5 mg of mirtazapine so increased it back to 15.  She is still getting only 5 hours of sleep.  She say she can't get comfortable.  Her PCP has talked with her about starting Lyrica,   Writer discussed with her Cymbalta to 20 mg daily instead of BID and that I am okay with her starting the Lyrica.  We discussed that the symptoms of depression and anxiety are in the mild range.  We discussed continuing at the mirtazapine 7.5 mg since the 15 mg was not any more effective.  We discussed that the time line with not passing out coincides with when the Cymbalta was lowered.      Recommendations/Plan and Rationale  PLAN: Decrease Cymbalta to 20 mg daily.  Continue other medications.  Writer will route note to Dr. Laurance Flatten and let her know I am fine with patient starting the Lyrica as if her pain is better controlled, her sleep may improve.   Return in 4 weeks.

## 2017-01-21 ENCOUNTER — Other Ambulatory Visit: Payer: Self-pay | Admitting: Psychiatry

## 2017-01-21 ENCOUNTER — Other Ambulatory Visit: Payer: Self-pay | Admitting: Neurology

## 2017-01-21 ENCOUNTER — Other Ambulatory Visit: Payer: Self-pay | Admitting: Primary Care

## 2017-01-21 DIAGNOSIS — Z760 Encounter for issue of repeat prescription: Secondary | ICD-10-CM

## 2017-01-21 MED ORDER — TRAMADOL HCL 50 MG PO TABS *I*
50.0000 mg | ORAL_TABLET | Freq: Three times a day (TID) | ORAL | 0 refills | Status: DC | PRN
Start: 2017-01-21 — End: 2017-04-23

## 2017-01-21 NOTE — Telephone Encounter (Signed)
Patient Name: Kaylee Harris Birth Date: June 19, 1975   Address: [place], [place] [address] Sex: Female   Rx Written Rx Dispensed Drug Quantity Days Supply Prescriber Name Payment Method Dispenser   11/21/2016 11/21/2016 diazepam 5 mg tablet  2 1 [name], Darnell Level (MD) [place]. #02   11/08/2016 11/08/2016 tramadol hcl 50 mg tablet  44 14 [name], Darnell Level (MD) [place]. #02   09/27/2016 10/01/2016 tramadol hcl 50 mg tablet  42 14 [place], [place]. #02   09/17/2016 09/17/2016 tramadol hcl 50 mg tablet  42 14 [place], [place]. #02   08/13/2016 08/19/2016 clonazepam 0.5 mg tablet  30 30 Corigliano, [name] D NP [employer] #02   06/21/2016 06/21/2016 diphenoxylate-atropine 2.5-0.025 mg tablet  240 30 Telford Nab A ([employer]) [employer] [place]. #02   06/04/2016 06/06/2016 tramadol hcl 50 mg tablet  4 14 [name], Darnell Level (MD) [place]. #02   02/29/2016 03/03/2016 tramadol hcl 50 mg tablet  23 14 [name], G (MD) [place]. [phone]

## 2017-01-22 ENCOUNTER — Encounter: Payer: Self-pay | Admitting: Primary Care

## 2017-01-22 MED ORDER — PREGABALIN 75 MG PO CAPS *I*
75.0000 mg | ORAL_CAPSULE | Freq: Two times a day (BID) | ORAL | 0 refills | Status: DC
Start: 2017-01-22 — End: 2017-02-17

## 2017-01-22 NOTE — Telephone Encounter (Signed)
The patient is calling  about the status of this medication. Please advise [phone]

## 2017-02-11 ENCOUNTER — Telehealth: Payer: Self-pay | Admitting: Urology

## 2017-02-11 NOTE — Telephone Encounter (Signed)
Called and informed patient dr. Sharrell Ku no longer with [place], patient would like to reschedule appt with provider in [place] office.

## 2017-02-12 ENCOUNTER — Telehealth: Payer: Self-pay | Admitting: Urology

## 2017-02-12 NOTE — Telephone Encounter (Signed)
lvm for Kaylee Harris to notify of appt made at  Her request.    appt scheduled for 10/23 /18 @ 8:30 with dr Domenica Fail at [place] office    Call center please confirm with patient when she calls office

## 2017-02-14 ENCOUNTER — Other Ambulatory Visit: Payer: Self-pay | Admitting: Primary Care

## 2017-02-17 ENCOUNTER — Other Ambulatory Visit
Admission: RE | Admit: 2017-02-17 | Discharge: 2017-02-17 | Disposition: A | Discharge status: Home / Self Care | Payer: Medicare (Managed Care) | Source: Ambulatory Visit | Attending: Primary Care | Admitting: Primary Care

## 2017-02-17 ENCOUNTER — Other Ambulatory Visit: Payer: Self-pay | Admitting: Primary Care

## 2017-02-17 ENCOUNTER — Other Ambulatory Visit: Payer: Self-pay | Admitting: Psychiatry

## 2017-02-17 ENCOUNTER — Encounter: Payer: Self-pay | Admitting: Gastroenterology

## 2017-02-17 DIAGNOSIS — M797 Fibromyalgia: Secondary | ICD-10-CM | POA: Insufficient documentation

## 2017-02-17 DIAGNOSIS — IMO0002 Reserved for concepts with insufficient information to code with codable children: Secondary | ICD-10-CM

## 2017-02-17 DIAGNOSIS — E1165 Type 2 diabetes mellitus with hyperglycemia: Secondary | ICD-10-CM | POA: Insufficient documentation

## 2017-02-17 LAB — COMPREHENSIVE METABOLIC PANEL
ALT: 42 U/L — ABNORMAL HIGH (ref 0–35)
AST: 32 U/L (ref 0–35)
Albumin: 4.2 g/dL (ref 3.5–5.2)
Alk Phos: 131 U/L — ABNORMAL HIGH (ref 35–105)
Anion Gap: 14 (ref 7–16)
Bilirubin,Total: 0.3 mg/dL (ref 0.0–1.2)
CO2: 21 mmol/L (ref 20–28)
Calcium: 9.5 mg/dL (ref 8.8–10.2)
Chloride: 101 mmol/L (ref 96–108)
Creatinine: 0.76 mg/dL (ref 0.51–0.95)
GFR,Black: 111 *
GFR,Caucasian: 96 *
Glucose: 321 mg/dL — ABNORMAL HIGH (ref 60–99)
Lab: 11 mg/dL (ref 6–20)
Potassium: 4.4 mmol/L (ref 3.3–5.1)
Sodium: 136 mmol/L (ref 133–145)
Total Protein: 6.9 g/dL (ref 6.3–7.7)

## 2017-02-17 NOTE — Telephone Encounter (Signed)
Patient Name: Kaylee Harris Birth Date: 1975/11/25   Address: [place], [place] [address] Sex: Female   Rx Written Rx Dispensed Drug Quantity Days Supply Prescriber Name Payment Method Dispenser   01/22/2017 01/22/2017 lyrica 75 mg capsule  60 30 [name], Darnell Level (MD) [place]. #02   01/21/2017 01/21/2017 tramadol hcl 50 mg tablet  42 14 [place], [place]. #02   11/21/2016 11/21/2016 diazepam 5 mg tablet  2 1 [name], Darnell Level (MD) [place]. #02   11/08/2016 11/08/2016 tramadol hcl 50 mg tablet  70 14 [name], Darnell Level (MD) [place]. #02   09/27/2016 10/01/2016 tramadol hcl 50 mg tablet  42 14 [place], [place]. #02   09/17/2016 09/17/2016 tramadol hcl 50 mg tablet  42 14 [place], [place]. #02   08/13/2016 08/19/2016 clonazepam 0.5 mg tablet  30 30 Corigliano, [name] D NP Insurance [place]. #02   06/21/2016 06/21/2016 diphenoxylate-atropine 2.5-0.025 mg tablet  240 30 Telford Nab A ([employer]) [employer] [place]. #02   06/04/2016 06/06/2016 tramadol hcl 50 mg tablet  74 14 [name], Darnell Level (MD) [place]. #02   02/29/2016 03/03/2016 tramadol hcl 50 mg tablet  24 14 [name], G (MD) [place]. #02   * - Drugs marked with an asterisk are compound drugs. If the compound drug is made up of more than one controlled substance, then each controlled substance will be a separate row in the table.      Report Suspicious Activity

## 2017-02-18 ENCOUNTER — Other Ambulatory Visit: Payer: Self-pay | Admitting: Psychiatry

## 2017-02-18 LAB — HEMOGLOBIN A1C: Hemoglobin A1C: 11 % — ABNORMAL HIGH (ref 4.0–6.0)

## 2017-02-18 MED ORDER — CLONAZEPAM 0.5 MG PO TABS *I*
0.5000 mg | ORAL_TABLET | Freq: Every day | ORAL | 0 refills | Status: DC | PRN
Start: 2017-02-18 — End: 2017-08-08

## 2017-02-19 ENCOUNTER — Encounter: Payer: Self-pay | Admitting: Primary Care

## 2017-02-19 ENCOUNTER — Encounter: Payer: Self-pay | Admitting: Registered Nurse

## 2017-02-19 DIAGNOSIS — Z760 Encounter for issue of repeat prescription: Secondary | ICD-10-CM

## 2017-02-20 MED ORDER — PREGABALIN 75 MG PO CAPS *I*
75.0000 mg | ORAL_CAPSULE | Freq: Two times a day (BID) | ORAL | 0 refills | Status: DC
Start: 2017-02-20 — End: 2017-03-21

## 2017-02-23 ENCOUNTER — Ambulatory Visit
Admission: AD | Admit: 2017-02-23 | Discharge: 2017-02-23 | Disposition: A | Discharge status: Home / Self Care | Payer: Medicare (Managed Care) | Source: Ambulatory Visit | Attending: Emergency Medicine | Admitting: Emergency Medicine

## 2017-02-23 ENCOUNTER — Ambulatory Visit
Admit: 2017-02-23 | Discharge: 2017-02-23 | Disposition: A | Discharge status: Home / Self Care | Payer: Medicare (Managed Care)

## 2017-02-23 DIAGNOSIS — Z23 Encounter for immunization: Secondary | ICD-10-CM

## 2017-02-23 DIAGNOSIS — M25551 Pain in right hip: Secondary | ICD-10-CM | POA: Insufficient documentation

## 2017-02-23 DIAGNOSIS — S73101A Unspecified sprain of right hip, initial encounter: Secondary | ICD-10-CM | POA: Insufficient documentation

## 2017-02-23 MED ORDER — KETOROLAC TROMETHAMINE 30 MG/ML IJ SOLN *I*
30.0000 mg | Freq: Once | INTRAMUSCULAR | Status: AC
Start: 2017-02-23 — End: 2017-02-23
  Administered 2017-02-23: 30 mg via INTRAMUSCULAR

## 2017-02-23 MED ORDER — INFLUENZA VAC SPLIT QUAD (FLULAVAL) 0.5 ML IM SUSY PF(>=6 MONTHS) *I*
0.5000 mL | PREFILLED_SYRINGE | Freq: Once | INTRAMUSCULAR | Status: AC
Start: 2017-02-23 — End: 2017-02-23
  Administered 2017-02-23: 0.5 mL via INTRAMUSCULAR

## 2017-02-23 MED ORDER — KETOROLAC TROMETHAMINE 10 MG PO TABS *I*
10.0000 mg | ORAL_TABLET | Freq: Four times a day (QID) | ORAL | 0 refills | Status: AC | PRN
Start: 2017-02-23 — End: 2017-02-28

## 2017-02-23 NOTE — Discharge Instructions (Signed)
Soak in warm to hot soapy water (or epsom salts) for 20 min, 3 times daily until better (or 1 cup rice in a cotton tube sock or knee high, put in microwave until warm to hot (~1-2 min), and apply to affected area)     Do NOT take Aleve or Ibuprofen while taking Toradol (ketorolac). You MAY take tylenol (acetaminophen), extra strength [phone] mg three times daily as needed to supplement toradol for pain relief.     PLEASE REVIEW ALL INSTRUCTIONS FOR DETAILS AND CONTENT CONTAINED IN THE DISCHARGE MATERIALS NOT COVERED AT DISCHARGE.    Thank you Kaylee Harris for coming to [facility] for your health care concerns.    If your condition changes and/or worsens please follow up with your primary doctor and/or return to the urgent care center.    In the event of an emergency please dial 911.

## 2017-02-23 NOTE — UC Provider Note (Signed)
History     Chief Complaint   Patient presents with    Hip Pain     patient complains of right sided hip pain that started last night. heard pop last night. recent fall 2 weeks ago     HPI Comments: Pt had a POTS episode on the stairs 2 weeks ago, when she passed out and fell down the stairs, did ok, but then yesterday, felt a "pop" with the right hip and has had increasing pain with weight bearing until this am, when she has not been able to put any weight on her right leg/hip without severe pain. She has taken ibuprofen and tylenol as well as her tramadol, lyrica without relief. States she took 800 mg ibuprofen around 0600 this am, and nothing else since. No numb/tingling or weakness., Denies FCNVD, no swelling. Pain is to anterior and lateral hip region. Pain with hip flexion as well as weight bearing. Did not feel it pop out of socket.      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 Lower abdomen with cellulitis changes, wound probed and purulent material expressed.  Had PICC line for "multiple infiltrations" of what? D/c-ed home on 10d of Augmentin 9/11.      Anginal pain     Anxiety     Arthritis     Asthma     Depression     Diabetes mellitus     Previously on SU and metformin, now diet controlled    Diverticulitis 09/2010    Dysfunctional uterine bleeding     Fibromyalgia     GERD (gastroesophageal reflux disease)     GERD (gastroesophageal reflux disease) 10/04/2016    High blood pressure     Hyperlipidemia     Liver disease     fatty liver     Long term (current) use of insulin, Dermal Adhesed V-Go Insulin Delivery Device 10/04/2016    Migraine     Neuromuscular disorder     POTS (postural orthostatic tachycardia syndrome)     Sebaceous cyst of breast     right axilla    TMJ  click, left 10/08/7844    Varicella         Patient Active Problem List   Diagnosis Code    DM (diabetes mellitus), type 2, uncontrolled E11.65    Migraine with aura G43.109    Asthma J45.909    Hyperlipemia E78.5    Fibromyalgia M79.7    Syncope and collapse- recurrent, working diagnosis vascular vs psychogenic. 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    IUD (intrauterine device) in place Z97.5    IBS (irritable bowel syndrome) K58.9    Mixed obsessional thoughts and acts F42.2    Carpal tunnel syndrome G56.00    Meralgia paresthetica of left side G57.12    No diabetic retinopathy in both eyes Z03.89    At risk for abuse of opiates Z91.89    History of repeated overdose Z91.89    Borderline personality disorder F60.3    Fatty liver disease, nonalcoholic N62.9    GERD (gastroesophageal reflux disease) B28.4    TMJ click, left X32.440    Anxiety F41.9    Bipolar 1  disorder F31.9    POTS (postural orthostatic tachycardia syndrome) R00.0, I95.1    Traumatic hemorrhage of right cerebrum S06.349A    Diverticulitis K57.92    Long term (current) use of insulin, Dermal Adhesed V-Go Insulin Delivery Device Z79.4    Morbid obesity with BMI of 40.0-44.9, adult E66.01, Z68.41    Hematuria R31.9            Past Surgical History:   Procedure Laterality Date    APPENDECTOMY  2016    arthroscopic shoulder surgery Right 2010    Related to lifting    CARDIAC CATHETERIZATION  09/2011    negative    COLON SURGERY      DILATION AND CURETTAGE OF UTERUS  2005, 2007    x2 for menorraghia    LEFT COLECTOMY  01/03/14    Dr Tresa Res    loop recorder  Feb 03 2014    loop recorder removal      PR COLONOSCOPY THRU COLOTOMY N/A 02/16/2016    Procedure: COLONOSCOPY;  Surgeon: Kelly Splinter, MD;  Location: [facility];  Service: GI    PR CYSTOURETHROSCOPY,BIOPSY N/A 10/17/2016    Procedure: CYSTOSCOPY BLADDER ;  Surgeon: Ed Blalock,  MD;  Location: [facility];  Service: Urology    PR EDG TRANSORAL BIOPSY SINGLE/MULTIPLE N/A 10/29/2016    Procedure: EGD;  Surgeon: Kelly Splinter, MD;  Location: [facility];  Service: GI    TONSILLECTOMY       Family History   Problem Relation Age of Onset    Hypertension Father     Diabetes Father     Kidney Disease Father     Elevated lipids Father     Heart attack Father 34    Other Father      PVD    Heart Disease Father     Hypertension Mother     Elevated lipids Mother     Heart attack Mother 80    Diabetes Mother     Eczema Mother     Psoriasis Mother     Heart Disease Mother     Hypertension Brother     Heart Disease Brother 2     prinzmetal's angina    Heart Disease Sister      currently having work up    [place] Sister     Hypertension Sister     Breast cancer Maternal Grandmother     Stroke Other     Cancer Sister     Thyroid disease Sister     Anesth problems Neg Hx           Social History   Substance Use Topics    Smoking status: Former Smoker     Packs/day: 0.50     Years: 3.00     Types: Cigarettes    Smokeless tobacco: Never Used      Comment: quit age late 56s    Alcohol use 0.0 oz/week     0 Standard drinks or equivalent per week      Comment: 0-1 drink/ month     Living Situation     Questions Responses    Patient lives with Spouse    Comment: Lives with Wife, Shari     Homeless No    Caregiver for other family member No    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   HENT: Negative.    Respiratory: Negative.    Cardiovascular: Negative.    Musculoskeletal: Positive for arthralgias and gait problem. Negative for joint swelling.   Skin: Negative.  Negative for color change.   Neurological: Negative for weakness, numbness and headaches.   Hematological: Does not bruise/bleed  easily.   Psychiatric/Behavioral: Positive for sleep disturbance. Negative for self-injury.       Physical Exam   Triage Vitals  Triage Start: Start, (02/23/17 1406)   First Recorded BP: 138/89, Resp: 18, Temp: 36.3 C (97.3 F), Temp src: TEMPORAL Oxygen Therapy SpO2: 99 %, O2 Device: None (Room air), Heart Rate: 84, (02/23/17 1408)  .  First Pain Reported  0-10 Scale: 8, (02/23/17 1408)       Physical Exam   Constitutional: She is oriented to person, place, and time. She appears well-developed and well-nourished.   HENT:   Head: Normocephalic and atraumatic.   Mouth/Throat: Oropharynx is clear and moist.   Eyes: EOM are normal.   Neck: Normal range of motion. Neck supple.   Cardiovascular: Normal rate, regular rhythm, normal heart sounds and intact distal pulses.    Pulmonary/Chest: Effort normal and breath sounds normal.   Musculoskeletal: She exhibits tenderness. She exhibits no edema.        Right hip: She exhibits decreased range of motion (pain with hip flexion, internal rotation and mildly so with external rotation of the hip), tenderness and bony tenderness. She exhibits normal strength, no swelling, no crepitus and no deformity.        Left hip: Normal.        Right knee: Normal.        Left knee: Normal.        Thoracic back: Normal.        Lumbar back: Normal.   Neurological: She is alert and oriented to person, place, and time. She has normal strength and normal reflexes. No sensory deficit.   Skin: Skin is warm, dry and intact. No rash noted.   Nursing note and vitals reviewed.       Medical Decision Making        Initial Evaluation:  ED First Provider Contact     Date/Time Event User Comments    02/23/17 1413 ED First Provider Contact Thurmond Hildebran R Initial Face to Face Provider Contact          Patient was seen on: 02/23/2017        Assessment:  41 y.o.female comes to the [facility] with right hip pain-anterolateral aspect and worse with internal rotation compared to external rotation, here  for eval as she has not been able to walk without severe pain the past 2 days, getting worse. Did fall down stairs 2 weeks ago, but was reportedly ok until recently.    Differential Diagnosis includes:  Hip pain  Arthritis  Hip sprain/fracture  Labral tear/injury.      Plan: right hip shows no acute fracture. Pt received Toradol IM here with good result, reduced pain from an 8/10 to a 5-6/10. supportive measures given, OTC suggestions given. RICE, ed ref ortho, f/u with pcp in 3-7 days for rehab guidance,  sooner if worse. Patient advised to use crutches for orthopedic injury. Patient was provided crutches during this visit. Teaching done. The nurse administered an influenza vaccine.     Orders Placed This Encounter    Crutches    *  Hip RIGHT AP and Lateral views    ED/UC REFERRAL TO ORTHO    Please Provide Supply: Crutches    ketorolac (TORADOL) 30 MG/ML injection 30 mg    Influenza vaccine (FLULAVAL) syringe 0.109mL - Quad, PF, for patients aged 6 months and older    ketorolac (TORADOL) 10 MG tablet       No results found for this or any previous visit (from the past 24 hour(s)).        Final Diagnosis    ICD-10-CM ICD-9-CM   1. Hip sprain, right, initial encounter S73.101A 843.9       Encourage fluids, encourage rest, good hand hygiene.    Use over the counter medications as discussed.    Please start the new medications as below:    Current Discharge Medication List      New Medications    Details Last Dose Given Next Dose Due Script Given?   ketorolac (TORADOL) 10 mg Dose: 10 mg  Take 10 mg by mouth 4 times daily as needed for Pain   IM given at: 1430 pm, Next dose (with food) at: 2030 pm  Quantity 20 tablet, Refill 0  Start date: 02/23/2017, End date: 02/28/2017       Comments: Emergency Encounter                   Please follow up with your physician as below:    Follow-up Information     Follow up with [name], MD. Schedule an appointment as soon as   possible for a visit in 1 week.     Specialties:  Primary Care, Family Medicine    Why:  As needed, If symptoms worsen    Contact information:    [address]  [address]  [place] [id number]  [phone]          Thank you LAVANYA ROA for coming to [facility] for your health care concerns.    If your condition changes and/or worsens please follow up with her primary doctor and/or return to the urgent care center.    If short of breath, chest pains or any other concerns please report to the emergency room.    In the event of an Emergency dial 911.      Final Diagnosis  Final diagnoses:   [I45.809X] Hip sprain, right, initial encounter (Primary)         Maurice March, MD              Dorathy Kinsman, MD  02/23/17 2053

## 2017-02-23 NOTE — ED Notes (Signed)
Discharge paperwork gone over with patient, all questions answered and pt verbalized understanding.

## 2017-02-24 ENCOUNTER — Ambulatory Visit: Payer: Medicare (Managed Care)

## 2017-02-24 ENCOUNTER — Telehealth: Payer: Self-pay

## 2017-02-24 ENCOUNTER — Encounter: Payer: Self-pay | Admitting: Orthopedic Surgery

## 2017-02-24 ENCOUNTER — Ambulatory Visit: Payer: Medicare (Managed Care) | Admitting: Orthopedic Surgery

## 2017-02-24 ENCOUNTER — Ambulatory Visit: Payer: Medicare (Managed Care) | Attending: Psychiatry | Admitting: Psychiatry

## 2017-02-24 ENCOUNTER — Ambulatory Visit
Admission: RE | Admit: 2017-02-24 | Discharge: 2017-02-24 | Disposition: A | Discharge status: Home / Self Care | Payer: Medicare (Managed Care) | Source: Ambulatory Visit | Attending: Radiology | Admitting: Radiology

## 2017-02-24 ENCOUNTER — Encounter: Payer: Self-pay | Admitting: Psychiatry

## 2017-02-24 VITALS — BP 140/71 | Ht 68.0 in | Wt 283.0 lb

## 2017-02-24 VITALS — BP 136/81 | HR 84 | Resp 18 | Ht 67.99 in | Wt 282.0 lb

## 2017-02-24 DIAGNOSIS — F603 Borderline personality disorder: Secondary | ICD-10-CM | POA: Insufficient documentation

## 2017-02-24 DIAGNOSIS — F422 Mixed obsessional thoughts and acts: Secondary | ICD-10-CM | POA: Insufficient documentation

## 2017-02-24 DIAGNOSIS — M7061 Trochanteric bursitis, right hip: Secondary | ICD-10-CM | POA: Insufficient documentation

## 2017-02-24 DIAGNOSIS — F3341 Major depressive disorder, recurrent, in partial remission: Secondary | ICD-10-CM | POA: Insufficient documentation

## 2017-02-24 DIAGNOSIS — M533 Sacrococcygeal disorders, not elsewhere classified: Secondary | ICD-10-CM | POA: Insufficient documentation

## 2017-02-24 MED ORDER — LIDOCAINE HCL 1 % IJ SOLN *I*
2.0000 mL | Freq: Once | INTRAMUSCULAR | Status: AC | PRN
Start: 2017-02-24 — End: 2017-02-24
  Administered 2017-02-24: 2 mL via INTRA_ARTICULAR

## 2017-02-24 MED ORDER — BETAMETHASONE ACET & SOD PHOS 6 (3-3) MG/ML IJ SUSP *I*
6.0000 mg | Freq: Once | INTRAMUSCULAR | Status: AC | PRN
Start: 2017-02-24 — End: 2017-02-24
  Administered 2017-02-24: 6 mg via INTRA_ARTICULAR

## 2017-02-24 MED ORDER — LIDOCAINE HCL 1 % IJ SOLN *I*
1.0000 mL | Freq: Once | INTRAMUSCULAR | Status: AC | PRN
Start: 2017-02-24 — End: 2017-02-24
  Administered 2017-02-24: 1 mL via INTRA_ARTICULAR

## 2017-02-24 NOTE — Procedures (Signed)
Large Joint Aspiration/Injection Procedure    Date/Time: 02/24/2017 9:33 PM  Consent given by: patient  Site marked: site marked  Timeout: Immediately prior to procedure a time out was called to verify the correct patient, procedure, equipment, support staff and site/side marked as required     Procedure Details    Location: hip - R greater trochanteric bursa  Preparation: The site was prepped using the usual aseptic technique.  Anesthetics administered: 2 mL lidocaine hcl 1 %; 1 mL lidocaine hcl 1 %  Intra-Articular Steroids administered: 6 mg betamethasone acetate & sodium phosphate 6 (3-3) MG/ML  Dressing:  A dry, sterile dressing was applied.  Patient tolerance: patient tolerated the procedure well with no immediate complications

## 2017-02-24 NOTE — Telephone Encounter (Signed)
Patient returned call and stated that she is going to follow up with Orthopedics first and will then contact the office back if she needs a follow up with Dr. Laurance Flatten. Patient declined at this time.

## 2017-02-24 NOTE — Telephone Encounter (Signed)
Kaylee Harris was discharged from Ur ed on 02/23/17 no follow up appointment is needed with PCP. Writer left message to see if patient made her  Follow up appointment with ortho.

## 2017-02-24 NOTE — Progress Notes (Addendum)
Behavioral Health Psychopharmacology Follow-up     Length of Session: 15 minutes.    Diagnosis Addressed    ICD-10-CM ICD-9-CM   1. 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 Mixed obsessional thoughts and acts F42.2 300.3   3. Borderline personality disorder F60.3 301.83     Chief Complaint:  "I fell down 8 steps."    Recent History and Response to Medications  Patient states: she fell down the steps two days ago and so presents in a motorized cart today. She says she may have a slight fracture but has to wait until she sees ortho.  She says she was officially diagnosed with POTS.  Her mood continues to be stable with minimal symptoms of depression and anxiety.    HPI     Follow-up    Additional comments: patient is here to follow up on her medications       Last edited by Shirlee More, LPN on 57/32/2025  4:27 PM. (History)          Current use of alcohol or drugs: No        Neurovegetative Symptoms Review:  Energy level: fair  Concentration: good  Sleep Quality: poor   Number of hours :  (5)  Appetite: fair     Wt Readings from Last 3 Encounters:   02/24/17 127.9 kg (282 lb)   01/20/17 127.9 kg (282 lb)   12/16/16 127.9 kg (282 lb)     Enjoyment/interest: fair    Current Medications  Current Outpatient Prescriptions   Medication Sig    ketorolac (TORADOL) 10 MG tablet Take 1 tablet (10 mg total) by mouth 4 times daily as needed for Pain   IM given at: 1430 pm, Next dose (with food) at: 2030 pm    pregabalin (LYRICA) 75 MG capsule Take 1 capsule (75 mg total) by mouth 2 times daily   Max daily dose: 150 mg MAXIMUM DAILY DOSE OF 2 PER DAY    DULoxetine (CYMBALTA) 20 MG DR capsule Take 1 capsule (20 mg total) by mouth daily    TOUJEO SOLOSTAR 300 UNIT/ML SOPN INJECT 30 UNITS INTO THE SKIN NIGHTLY    lamoTRIgine (LAMICTAL) 150 MG tablet TAKE 1 TABLET BY MOUTH EVERY DAY    traMADol (ULTRAM) 50 MG tablet Take 1 tablet (50 mg total) by mouth every 8 hours as needed (for  head pain and abd pain)   Max daily dose: 150 mg    busPIRone (BUSPAR) 30 MG tablet TAKE 1 TABLET BY MOUTH TWO TIMES DAILY    mirtazapine (REMERON) 15 MG tablet Take 1 tablet (15 mg total) by mouth nightly    cetirizine (ZYRTEC) 10 MG tablet Take 1 tablet (10 mg total) by mouth daily    continuous blood glucose monitor (FREESTYLE LIBRE) reader Use with Freestyle Libre CGM sensor    continuous blood glucose monitor (FREESTYLE LIBRE) sensor Apply sensor to back of upper arm. Replace sensor every 10 days.    pantoprazole (PROTONIX) 40 MG EC tablet Take 1 tablet (40 mg total) by mouth daily   SWALLOW WHOLE. DO NOT CRUSH, BREAK, OR CHEW.    topiramate (TOPAMAX) 100 MG tablet Take 1 tablet (100 mg total) by mouth 2 times daily    atorvastatin (LIPITOR) 40 MG tablet Take 1 tablet (40 mg total) by mouth daily (with dinner)    promethazine (PHENERGAN) 12.5 MG tablet Take 1 tablet (12.5 mg total) by mouth 4 times daily as needed  albuterol HFA 108 (90 Base) MCG/ACT inhaler Inhale 1-2 puffs into the lungs every 6 hours as needed for Wheezing   Shake well before each use.    fluticasone (FLONASE) 50 MCG/ACT nasal spray 1 spray by Nasal route daily    insulin lispro 100 UNIT/ML injection vial For VGO: Dinner 94R (6 clicks) ; B & L 4- 6 units. BG 100 - 150 8u (4clk), 151 - 200 10U (5clk), >201 12U ( 6 ck). MDD 72U (36 clk)    SUMAtriptan (IMITREX) 50 MG tablet Take 1 tablet (50 mg total) by mouth as needed for Migraine   Take at onset of headache. May repeat once in 2 hours.    phenazopyridine (PYRIDIUM) 100 MG tablet Take 1 tablet (100 mg total) by mouth every 8 hours as needed    famciclovir (FAMVIR) 500 MG tablet Take 1 tablet (500 mg total) by mouth 3 times daily as needed    Insulin Disposable Pump (V-GO 30) KIT Use daily; 30ct/month.    dulaglutide (TRULICITY) 1.5 DE/0.8XK injection pen Inject 0.5 mLs (1.5 mg total) into the skin every 7 days    metoprolol (TOPROL-XL) 25 MG 24 hr tablet Take by mouth  daily       ascorbic acid (VITAMIN C) 100 MG tablet Take 100 mg by mouth daily    midodrine (PROAMATINE) 10 MG tablet Take 1 tablet (10 mg total) by mouth 3 times daily    insulin syringe-needle U-100 (BD ULTRAFINE) 31G X 5/16" 0.3 ML HALF-UNIT Use 3 times a day as instructed.    FREESTYLE LITE test strip Use four times daily as directed for 250.02    blood glucose monitor system Brand: cheapest brand available per her insurance.  Use as directed.    lancets Brand Free Style Lite; Use 2 times per day as directed for blood glucose testing.    Alcohol Swabs (ALCOHOL WIPES) PADS Use BID for BG check    levonorgestrel (MIRENA) 20 MCG/24HR IUD 1 each by Intrauterine route once    Non-System Medication The above patient is followed in our clinic and cannot resume work permanently.    clonazePAM (KLONOPIN) 0.5 MG tablet Take 1 tablet (0.5 mg total) by mouth daily as needed   Max daily dose: 0.5 mg    SUMAVEL DOSEPRO 6 MG/0.5ML needle-free injection INJECT 0.5MLS (6MG TOTAL) UNDER THE SKIN AS NEEDED FOR MIGRAINE MAY REPEAT ONCE AFTER 1 HOUR IF NEEDED     No current facility-administered medications for this visit.        Side Effects  Patient Reported Side Effects: None reported    Mental Status  APPEARANCE: Casual  ATTITUDE TOWARD INTERVIEWER: Cooperative  MOTOR ACTIVITY: WNL (within normal limits)  EYE CONTACT: Direct  SPEECH: Normal rate and tone  AFFECT: Full Range and Appropriate  MOOD: Euthymic  THOUGHT PROCESS: Normal  THOUGHT CONTENT: No unusual themes  PERCEPTION: Within normal limits  ORIENTATION: Alert and Oriented X 3.  CONCENTRATION: Good  MEMORY:   Recent: intact   Remote: intact  COGNITIVE FUNCTION: Average intelligence  JUDGMENT: Intact  IMPULSE CONTROL: Fair and Poor mostly around her diet.   INSIGHT: Fair    Risk Assessment    Self Injury: Patient Denies  Suicidal Ideation: Patient Denies  Homicidal Ideation: Patient Denies  Aggressive Behavior: Patient Denies    Results  GAD-7 Dates 02/24/2017  01/20/2017 12/16/2016 11/04/2016 09/09/2016 06/10/2016 05/13/2016   Total Score _0 Recent Review Flowsheet  Data     PHQ-2/9 Scores 02/24/2017 01/20/2017 12/16/2016 11/04/2016 09/09/2016 06/10/2016 05/22/2016    PHQ9 Q9 - Better Off Dead 0 0 0 0 0 0 0    PHQ9 Calculated Score _0 BP Readings from Last 3 Encounters:   02/24/17 136/81   02/23/17 138/89   01/20/17 127/75       Assessment  FORMULATION: Patient's mood continues to be stable with lowering the Cymbalta to 20 mg and she has started Lyrica which she said was helping with her pain until the fall.  Her right hip is very sore and she is waiting to hear back from Ortho about an appointment.  We discussed going ahead and stopping the Cymbalta to which she agreed.  The mirtazapine continues to help with her sleep and the Lamictal and Buspar stabilize her mood and the anxiety.  She rarely uses the prn of Klonopin; "Maybe twice a month if needed."  We discussed not making any changes in her other medications.     Recommendations/Plan and Rationale  PLAN: Discontinue Cymbalta. Continue Mirtazapine 15 mg nightly, Lamictal 150 mg daily and Buspar 30 mg bid.  Return in 12 weeks.

## 2017-02-24 NOTE — Progress Notes (Signed)
Behavioral Health Progress Note     LENGTH OF SESSION: 30 minutes    Contact Type:  Location: On Site    Face to Face     Problem(s)/Goals Addressed from Treatment Plan:    Problem 1:   Treatment Problem #1 01/20/2017   Patient Identified Problem OCD       Goal for this problem:    Treatment Goal #1 01/20/2017   Patient Identified Goal Work to reduce obsessional thoughts and actions       Progress towards this goal: Problem resolving. Comment: Patient reports continued stable mood.    Mental Status Exam:  APPEARANCE: Appears stated age, Well-groomed, Casual  ATTITUDE TOWARD INTERVIEWER: Cooperative  MOTOR ACTIVITY: WNL (within normal limits)  EYE CONTACT: Direct  SPEECH: Normal rate and tone  AFFECT: Neutral  MOOD: Lively and Neutral  THOUGHT PROCESS: Normal and Circumstantial  THOUGHT CONTENT: No unusual themes and Negative Rumination  PERCEPTION: No evidence of hallucinations  CURRENT SUICIDAL IDEATION: patient denies  CURRENT HOMICIDAL IDEATION: Patient denies  ORIENTATION: Alert and Oriented X 3.  CONCENTRATION: Good  MEMORY:   Recent: intact   Remote: intact  COGNITIVE FUNCTION: Average intelligence  JUDGMENT: Intact  IMPULSE CONTROL: Good  INSIGHT: Good and Fair    Risk Assessment:  ASSESSMENT OF RISK FOR SUICIDAL BEHAVIOR  Changes in risk for suicide from baseline Formulation of Risk and/or previous intake, including newly identified risk, if any: none  Violence risk was assessed and No Change noted from baseline formulation of risk and/or previous assessment.    Session Content:: Patient presents in a motorized cart today. She said that she passed out and fell last week down stairs when she and her spouse were at a friend's house. Patient said that her hip is injured and she is scheduled for an MRI. Patient said that her appointment with her cardiologist determined that she has POTS and it is not psychogenic. Patient said that she already drinks a great deal of water but is not willing to give up her coffee  and doesn't like salt.    Patient reports continued stable mood. She said that she continues to try to facilitate better relations within her family system. Patient said that she feels she has made progress as she was able to use I feel statements. Writer commended patient on this.     Visit Diagnosis:      ICD-10-CM ICD-9-CM   1. 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 Mixed obsessional thoughts and acts F42.2 300.3   3. Borderline personality disorder F60.3 301.83       Interventions:  Taught/practiced coping skills (specify skills used):  observing and setting limits, self care  Solution Focused therapy    Current Treatment Plan   Created/Updated On 01/20/2017   Next Treatment Plan Due 04/20/2017     Plan:  Psychotherapy continues as described in care plan; plan remains the same.    NEXT APPT: 03/24/17    Tresa Res, LCSW

## 2017-02-24 NOTE — Progress Notes (Signed)
Chief Complaint: Right hip pain      HPI: Patient presents today for evaluation of right hip.  States about one week ago, she fell down a flight of stairs from fainting, felt a pop that she locates the lateral hip, pain and difficulty bearing weight since.  Denies a history of hip pain or injury.  Reports a history of frequent fainting due to postural orthostatic tachycardia syndrome.  She also has a history of fibromyalgia.  She locates pain to the right buttock and to the lateral/anterior thigh.  States pain is worse with bearing weight.  She was seen by urgent care on 02/23/17, they obtained x-rays demonstrating no sign of fracture.  They gave her IM Toradol which helped and provided her with crutches.  They recommended orthopedic evaluation.  She denies any numbness or tingling.  Denies any other injury.    ROS: 10 + Review of systems per patient questionnaire was reviewed and confirmed. Pertinent positives listed in HPI, all other systems negative.     Medications, Family history and allergies reviewed, per patient question, see chart listing.      Past medical history: [facility], type 2 diabetes, diverticulosis, fibromyalgia, asthma, depression/anxiety      Past surgical history: D&C, tonsillectomy, appendectomy, shoulder surgery      Social History: She is married.  She denies smoking, illicit drug use, alcohol consumption.      Physical Exam:  Constitutional: 41 year old female, alert, oriented, no acute distress.  Musculoskeletal: She presents with crutches today.  Evaluation of the lumbar spine reveals skin intact, no ecchymosis swelling or deformity.  Tenderness to palpation along the lumbar spine.  Tenderness at the right SI joint.  Pain with a lying straight leg raise.  Evaluation of the right hip reveals skin intact, no ecchymosis, swelling or deformity.  Point tender to palpation at the greater trochanteric region.  Nontender at the groin.  Tolerates gentle hip log rolling.  Internal and external rotation  provokes pain which she locates to the right buttock and lateral hip.  Negative Faber/fadir.  Symmetric patellar reflexes.  Sensation is intact and symmetrical.  The leg is well perfused.      Imaging: Radiographs of the right hip obtained in urgent care demonstrated no acute osseous or soft tissue abnormalities.  An AP pelvis was obtained in the office demonstrating no acute osseous or soft tissue abnormalities.      Assessment/Diagnosis: 1) posttraumatic trochanteric bursitis 2) SI joint pain, aggravated by fall      Plan: All findings are presented to the patient.  I recommended a steroid injection into the trochanteric bursa, she wished to proceed with this today.  She tolerated the injection well.  I also recommended a formal PT, activity modification, protected weightbearing with crutches.  Follow up in 4 weeks for reassessment. If she continues to experience SI pain, likely referral to the spine division.          Orders Today: ap pelvis, PT, injection    Orders placed for next visit: none    Supervising Physician: Dr. Noland Fordyce

## 2017-02-25 ENCOUNTER — Encounter: Payer: Self-pay | Admitting: Urology

## 2017-02-25 ENCOUNTER — Ambulatory Visit: Payer: Medicare (Managed Care) | Admitting: Urology

## 2017-02-25 ENCOUNTER — Encounter: Payer: Self-pay | Admitting: Psychiatry

## 2017-02-25 ENCOUNTER — Encounter: Payer: Self-pay | Admitting: Primary Care

## 2017-02-25 DIAGNOSIS — M25551 Pain in right hip: Secondary | ICD-10-CM | POA: Insufficient documentation

## 2017-02-26 ENCOUNTER — Encounter: Payer: Self-pay | Admitting: Psychiatry

## 2017-02-27 ENCOUNTER — Ambulatory Visit
Payer: Medicare (Managed Care) | Attending: Orthopedic Surgery | Admitting: Rehabilitative and Restorative Service Providers"

## 2017-02-27 DIAGNOSIS — M7061 Trochanteric bursitis, right hip: Secondary | ICD-10-CM | POA: Insufficient documentation

## 2017-02-27 NOTE — Progress Notes (Signed)
A user error has taken place: encounter opened in error, closed for administrative reasons.

## 2017-02-27 NOTE — Progress Notes (Addendum)
Sent via: eRecord EMR    Physician attestation for [facility] Plan of Care: Physician:  Greta Doom, NP  Per signature, I have reviewed and agree with the documented plan of care.    ____________________________________________________________         [facility] REHABILITATION LE EVALUATION        Diagnosis: R Hip Traumatic Bursitis     Onset date:  02/16/17  Date of surgery: NA    History    Kaylee Harris is a 41 y.o. female who is present today for right hip care.   Mechanism of injury/history of symptoms:  Trauma and fell due to fainting from POTS   She has been using axillary crutches since urgent care visit when she fell. She is now able to put more weight on her leg  She  Had a cortisone injection in her hip on 02/25/17. 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  Sport(s): NONE  Other: NA  Diagnostic tests: X-ray    Symptom location: Lateral and Posterior, right  Relevant symptoms:  Sharp, Decreased strength  Symptom frequency: Intermittent  Symptom intensity:  (0 - 10 scale): Now 4 Best 0 Worst 8   Night Pain: Yes   Restful sleep:   No  Morning Pain/Stiffness: Unchanged   Symptoms worsen with: Squatting, Walking, get into bed  Symptoms improve with: Ice, Heat, Medication  Assistive device:  crutches  Patients goals for therapy: Reduce pain, Increase ROM / flexibility, Increase strength, Achieve independence with home program for self care / condition management     Patient Specific Functional Scale    Activities rated 0-10, where 0 is the inability to perform task  Activity    Walking  4   Ascending Stairs 5   Descending Stairs 6   Squatting 4   Driving    Getting in / out of car 7   Into/out of bed  5       Total 31/60     Total score = sum of the activity scores / number of  activities  Minimal detectable change for average score = 2 points  Minimal detectable change for single activity score = 3 points      Objective    Observation: Unremarkable  Gait:  PWB, Axillary Crutches, also trialed using one crutch, Antalgic    Lumbar Screen:  Not Tested, hx of LBP  Neurologic:  Myotomes:  LE all myotomes at least 1/2 grade decreased on R    Palpation:  tenderness @ Glut medius  Incision:  NA      ROM/Strength      HIP LEFT RIGHT STRENGTH    PROM AROM PROM AROM Left Right   Flexion      4   Extension         Abduction         IR      4   ER      4   Adduction                        KNEE LEFT RIGHT STRENGTH    PROM AROM PROM AROM Left Right   Flexion     5  4   Extension     5 4                  ANKLE LEFT RIGHT STRENGTH    PROM AROM PROM AROM Left Right   DF (knee ext)         DF (knee flex)         PF         INV         EV                  CKC Ankle DF Knee to Wall (cm)              LE Flexibility  Hamstring:  Fair  Illiotibial Band:  Fair  Ilipsoas : WNL    Special Tests:   Hip FADIR,  negative  FABER,  negative  Gluteal tendinopathy,  negative  Trendelenberg,  negative   Knee NA   Ankle Homan,  negative          Functional:  See Patient specific functional score above      Assessment:    Findings consistent with 41 y.o. female with traumatic R hip Gluteus medius tendonitis/bursitis with pain, strength limitations, functional limitations, patient educated on ambulating with one crutch and WBAT as well as with tow axillary crutches. adjusted handle position up so patient can unload better during ambulation. She takes it slow as advised  with one crutch and if she is too uncomfortable or safety an issue, she will revert to using two as previously. she likes one crutch better. .    Personal factors affecting treatment/recovery:   Unemployed   Substance abuse history   History of falls   Recent hospital admission  Comorbidities affecting treatment/recovery:   Balance  disorder   Depression   Diabetes   Fibromyalgia   Joint pain: hx LBP   Obesity   Osteoarthritis (OA)   Psychological disorders: Bipolar disorder; birderline personality disorder   POTS  Clinical presentation:   evolving  Patient complexity:     high level as indicated by above stability of condition, personal factors, environmental factors and comorbidities in addition to patient symptom presentation and impairments found on physical exam.    Prognosis:  Good   Contraindications/Precautions/Limitation:  Per protocol, Per diagnosis and patient with hx of frequent syncope due to POTS. less freq now, but this is how she fell doen her stairs and injured her hip     Short Term Goals: (6 week(s)): Decrease pain to 3/10 , Increase strength to mini squats, step-ups, downs, , Minimal assistance with HEP/ education concepts and PSFS to 40  Long Term Goals: (3 month(s)): Pain/Sx 0 - minimal, Restoration of functional strength, Independent with HEP/education , Functional return to ADLs / activities without limitations, PSFS to 60     Treatment Plan:   Options / plan reviewed with patient:  Yes  Freq 1 times per week for 3 month(s)    Treatment plan inclusive of:   Exercise: Stretching, Progressive Resistive, Aerobic exercise   Manual Techniques:  N/A   Modalities:  Cold pack, Functional/Therapeutic activites per flowsheet, Interferential, Joint Mobilizations, Moist heat, Neuromuscular Re-ed,  Activity guidelines, Ther Exercise per flowsheet   Functional: Proprioception/Dynamic stability, Functional rehab    Thank you for referring this patient to [facility].    Birdie Hopes, PT    Nu stepper >>>?   PPT 5" x 10   bridges 5" x 20   adductor  squeezes 3" x 20   Lateral HS stretch 30" x 3                              TIME + FUNCTIONAL REPORTING SHEET    Treatment Start Time 230   Treatment End Time 340   Total Minutes of treatment 70       Total Non-Treatment time(rest, etc)    Total Service Based Min. of  Treatment 35   Total Time-Based minutes of treatment 25           Service-Based Procedures/ Modalities    Evaluation 35   Re-evaluation    Traction, mechanical    Electric stimulation (unattended)    Whirlpool    Vasopneumatic device    Total service-based billable procedures 35       Time-Based  Procedures / Modalities    Electric stimulation (manual)    Iontophoresis    Contrast baths    Ultrasound    Physical Performance Test    Therapeutic ex 25   Neuromuscular reed    Gait training, including stairs    Manual Therapy    Therapeutic Activities    Total Time-Based Billable Procedures 25           FUNCTIONAL REPORTING for PT     GOAL STATUS  Mobility Walking Moving Around /  Mobility GOAL status  G8979  CH  0 % impaired, limited or restricted    CURRENT or DISCHARGE STATUS  CURRENT STATUS  Mobility Walking Moving Around / CURRENT status G8978   CL  60 - < 80% impaired, limited, restricted      Recorded on encounter:YES   POC DATE: 02/27/17

## 2017-02-28 ENCOUNTER — Encounter: Payer: Self-pay | Admitting: Rehabilitative and Restorative Service Providers"

## 2017-02-28 ENCOUNTER — Other Ambulatory Visit: Payer: Self-pay | Admitting: Primary Care

## 2017-02-28 MED ORDER — FLUTICASONE PROPIONATE 50 MCG/ACT NA SUSP *I*
1.0000 | Freq: Every day | NASAL | 3 refills | Status: DC
Start: 2017-02-28 — End: 2018-02-20

## 2017-03-05 ENCOUNTER — Other Ambulatory Visit: Payer: Self-pay | Admitting: Registered Nurse

## 2017-03-05 ENCOUNTER — Ambulatory Visit: Payer: Medicare (Managed Care) | Attending: Nutrition | Admitting: Registered Nurse

## 2017-03-05 DIAGNOSIS — E1165 Type 2 diabetes mellitus with hyperglycemia: Secondary | ICD-10-CM

## 2017-03-05 NOTE — Patient Instructions (Signed)
1.  Maximize bolus amts for VGO total 60RVIFB = 6 clicks , 3 x day    2.  addtl clicks if BG over 379    3.  New soft sites for VGo application    4.  Check 4 x day & bolus 3 to 4 x day     If no eating, correction 1 or 2 clicks      Base for food 6 clicks

## 2017-03-05 NOTE — Progress Notes (Signed)
Leata Mouse 41 y.o. female was seen today for diabetes education regarding  uncontrolled type 2 diabetes mellitus. Last seen 06/19/16 for DSMT & continue VGO insulin system + Trulicity injection teaching.  individual visits with VGO insuiln patch pump.     Diabetes Meds: reviewed in eRecord; patient is taking as prescribed. Trulicity 1.5 mg q week;Humalog vial in VGO  30 insulin delivery system (1 click = 2 units); recall B 6 clicks 3 x day.     Lab Results   Component Value Date    HA1C 11.0 (H) 02/17/2017    HA1C 7.4 (H) 05/21/2016    HA1C 7.3 (H) 12/27/2015    MALBR 0.31 09/11/2015    CREAT 0.76 02/17/2017    LDLC 60 12/27/2015     Self glucose monitoring:  [name]  BG meter 4 times daily. fbs 152; AC meals 130-190-30 - 400      BG today:  332 mg/dl 2  PP meal & took 6 clicks (12 units) + addtl 1 click (2 units) correction       Diet: 3 meals per day wt loss  Maintained 283 lbs from 300 lbs ; less hungry  Current Activity  As tolerated  Smoking: no Alcohol: no rare     Self foot check:  self care  Eye Exam inquiry: due     Assessment:   Describes proper skill with filling of VGO device & skin placement on abdomen  Currently using VGO30  Needs new soft sites for VGO application  Partner present & both demonstrate proper understanding of basal / bolus system  2 units = 1 click  Will cont  6 clicks (AC B & L + dinner;  correciton if > 150  May need VGO40 if BGs high despite max bolus amts   nstructed correction dosing if bG 150 or >; 1 to 30 correction factor  Due to mental health, feels cannot participate in group DM education & benefits individual       Coun/Edu:     BARRIERS THAT AFFECT LEARNING:  Mental health ; followed by psych  READINESS TO LEARN:  good  PREFERRED METHODS OF LEARNING:  reading, demonstration and visual tools  EDUCATION PROVIDED: insulin teaching, BG Goals, using ICR and CF to determine meal bolus and future CGM; risk reduction; skin care with VGO    Plan:     Patient Instructions   1.   Maximize bolus amts for VGO total 16XWRUE = 6 clicks , 3 x day    2.  addtl clicks if BG over 454    3.  New soft sites for VGo application    4.  Check 4 x day & bolus 3 to 4 x day     If no eating, correction 1 or 2 clicks      Base for food 6 clicks    DSMT SUPPORT PLAN:  Take advantage of your annual Medicare benefits for DSMT and MNT  Follow-up visit:  04/09/2017 at [facility] location  Time spent counseling patient: 30 minutes  M[employer] insurance MD follow up referral is in eRecord    Allowed 2 hrs Individual/group DSMT 1 1/2 hrs remain; expires 05/05/2017     Valaria Good, RN CDE

## 2017-03-12 ENCOUNTER — Encounter: Payer: Self-pay | Admitting: Gastroenterology

## 2017-03-12 ENCOUNTER — Ambulatory Visit
Payer: Medicare (Managed Care) | Attending: Orthopedic Surgery | Admitting: Rehabilitative and Restorative Service Providers"

## 2017-03-12 DIAGNOSIS — M7061 Trochanteric bursitis, right hip: Secondary | ICD-10-CM | POA: Insufficient documentation

## 2017-03-12 NOTE — Progress Notes (Signed)
Orthopedic Sports/Spine  PT Note    Kaylee Harris   [id number]     Diagnosis: R Hip Traumatic Bursitis     Subjective:  Pain Score:  4  Pain: Improved; feels exercises seem to help    Objective:  ROM - Unchanged, Right, Hip,   Strength - added front planks and standing hip extension  Function: - Unchanged  Education:  Updated HEP, Verbal cues for ther ex, Manual cues for ther ex, Spine ed concepts    Objective         Nu stepper 10' level 1   PPT 5" x 20   Bridges I 5" x 20   adductor squeezes 3" x 20   Lateral HS stretch 30" x 3   Front planks I 10" x 4   Standing hip extension 2 x 10                   Treatment:  Ther Exercise per flowsheet, Ther Exercise per flowsheet, added per bold font    Assessment:   Patient had increased pain with SLB on R. She has positive Trendelenburg on R mnot appreciated at time of eval. Her hip may be too weak for this drill. Advised to be cautious with this. Will focus on gluts and keep loads off of abductors until she can stabilize with core and gluts       Plan of Care:  Continue per Plan of care advised no SLB if increases her pain    Thank you for referring this patient to [place] Time 330   Treatment End Time 420   Total Minutes of treatment 50       Total Non-Treatment time(rest, etc)    Total Service Based Min. of Treatment    Total Time-Based minutes of treatment 50           Service-Based Procedures/ Modalities    Evaluation    Re-evaluation    Traction, mechanical    Electric stimulation (unattended)    Whirlpool    Vasopneumatic device    Total service-based billable procedures        Time-Based  Procedures / Modalities    Electric stimulation (manual)    Iontophoresis    Contrast baths    Ultrasound    Physical Performance Test    Therapeutic ex 50   Neuromuscular reed    Gait training, including stairs    Manual Therapy    Therapeutic Activities    Total  Time-Based Billable Procedures 50           FUNCTIONAL REPORTING for PT     GOAL STATUS  Mobility Walking Moving Around /  Mobility GOAL status  G8979  CH  0 % impaired, limited or restricted    CURRENT or DISCHARGE STATUS  CURRENT STATUS  Mobility Walking Moving Around / CURRENT status G8978   CL  60 - < 80% impaired, limited, restricted      Recorded on encounter:YES   POC DATE: 02/27/17

## 2017-03-17 ENCOUNTER — Telehealth: Payer: Self-pay

## 2017-03-17 ENCOUNTER — Encounter: Payer: Self-pay | Admitting: Registered Nurse

## 2017-03-19 ENCOUNTER — Other Ambulatory Visit: Payer: Self-pay

## 2017-03-19 ENCOUNTER — Ambulatory Visit: Payer: Medicare (Managed Care) | Admitting: Rehabilitative and Restorative Service Providers"

## 2017-03-19 ENCOUNTER — Encounter: Payer: Self-pay | Admitting: Gastroenterology

## 2017-03-19 DIAGNOSIS — M7061 Trochanteric bursitis, right hip: Secondary | ICD-10-CM

## 2017-03-19 LAB — HM DIABETES EYE EXAM

## 2017-03-19 NOTE — Progress Notes (Signed)
Orthopedic Sports/Spine  PT Note    Kaylee Harris   [id number]     Diagnosis: R Hip Traumatic Bursitis     Subjective:  Pain Score:  4  Pain: Improved; patient continues to report pain in her lateral R hip into Iliac crest and into groin with standing (WBing) and with sitting    Objective:  ROM - Unchanged, Right, Hip,   Strength - added front planks and standing hip extension  Function: - Unchanged  Education:  Updated HEP, Verbal cues for ther ex, Manual cues for ther ex, Spine ed concepts    Objective         Nu stepper 10' level 1   PPT 5" x 20   Bridges I 5" x 20   adductor squeezes 3" x 20   Lateral HS stretch 30" x 3   Front planks I 10" x 5   Standing hip extension 2 x 10   Quadratus Lumorum stretch (R only) 15" x 3   Bird dogs 5" x 10       Wall slide squats with ball >>>       Long axis distraction to r hip  >>>       Treatment:  Ther Exercise per flowsheet, Ther Exercise per flowsheet, added per bold font    Assessment:   Patient may have suffered a labral to her R hip injury from her fall.        Plan of Care:  Continue per Plan of care - add wall slide squats if tolerate; long axis distraction trial    Thank you for referring this patient to [facility] Start Time 330   Treatment End Time 420   Total Minutes of treatment 50       Total Non-Treatment time(rest, etc)    Total Service Based Min. of Treatment    Total Time-Based minutes of treatment 50           Service-Based Procedures/ Modalities    Evaluation    Re-evaluation    Traction, mechanical    Electric stimulation (unattended)    Whirlpool    Vasopneumatic device    Total service-based billable procedures        Time-Based  Procedures / Modalities    Electric stimulation (manual)    Iontophoresis    Contrast baths    Ultrasound    Physical Performance Test    Therapeutic ex 50   Neuromuscular reed    Gait training, including stairs    Manual  Therapy    Therapeutic Activities    Total Time-Based Billable Procedures 50           FUNCTIONAL REPORTING for PT     GOAL STATUS  Mobility Walking Moving Around /  Mobility GOAL status  G8979  CH  0 % impaired, limited or restricted    CURRENT or DISCHARGE STATUS  CURRENT STATUS  Mobility Walking Moving Around / CURRENT status G8978   CL  60 - < 80% impaired, limited, restricted      Recorded on encounter:YES   POC DATE: 02/27/17

## 2017-03-20 ENCOUNTER — Encounter: Payer: Self-pay | Admitting: Psychiatry

## 2017-03-21 ENCOUNTER — Encounter: Payer: Self-pay | Admitting: Primary Care

## 2017-03-21 ENCOUNTER — Encounter: Payer: Self-pay | Admitting: Psychiatry

## 2017-03-21 ENCOUNTER — Other Ambulatory Visit: Payer: Self-pay | Admitting: Psychiatry

## 2017-03-21 MED ORDER — PREGABALIN 150 MG PO CAPS *A*
150.0000 mg | ORAL_CAPSULE | Freq: Two times a day (BID) | ORAL | 0 refills | Status: DC
Start: 2017-03-21 — End: 2017-03-24

## 2017-03-21 MED ORDER — DULOXETINE HCL 20 MG PO CPEP *I*
20.0000 mg | DELAYED_RELEASE_CAPSULE | Freq: Every day | ORAL | 2 refills | Status: DC
Start: 2017-03-21 — End: 2017-03-24

## 2017-03-21 NOTE — Telephone Encounter (Signed)
Patient Name: Kaylee Harris Birth Date: Feb 21, 1976   Address: [place], [place] [address] Sex: Female   Rx Written Rx Dispensed Drug Quantity Days Supply Prescriber Name Payment Method Dispenser   02/20/2017 02/21/2017 lyrica 75 mg capsule  60 30 [name], Darnell Level (MD) [place]. #02   02/18/2017 02/19/2017 clonazepam 0.5 mg tablet  28 28 Corigliano, [name] D NP [employer] #02   01/22/2017 01/22/2017 lyrica 75 mg capsule  60 30 [name], Darnell Level (MD) [place]. #02   01/21/2017 01/21/2017 tramadol hcl 50 mg tablet  42 14 [place], [place]. #02   11/21/2016 11/21/2016 diazepam 5 mg tablet  2 1 [name], Darnell Level (MD) [place]. #02   11/08/2016 11/08/2016 tramadol hcl 50 mg tablet  29 14 [name], Darnell Level (MD) [employer] [place]. #02   09/27/2016 10/01/2016 tramadol hcl 50 mg tablet  42 14 [place], [place]. #02   09/17/2016 09/17/2016 tramadol hcl 50 mg tablet  42 14 [place], [place]. #02   08/13/2016 08/19/2016 clonazepam 0.5 mg tablet  30 30 Corigliano, [name] D NP [employer] [place]. #02   06/21/2016 06/21/2016 diphenoxylate-atropine 2.5-0.025 mg tablet  240 30 Telford Nab A ([facility]) [employer] [place]. #02   06/04/2016 06/06/2016 tramadol hcl 50 mg tablet  77 14 [name], Darnell Level (MD) [place]. [phone]

## 2017-03-22 ENCOUNTER — Encounter: Payer: Self-pay | Admitting: Primary Care

## 2017-03-24 ENCOUNTER — Ambulatory Visit: Payer: Medicare (Managed Care) | Attending: Psychiatry

## 2017-03-24 ENCOUNTER — Ambulatory Visit: Payer: Medicare (Managed Care) | Admitting: Rehabilitative and Restorative Service Providers"

## 2017-03-24 ENCOUNTER — Ambulatory Visit: Payer: Medicare (Managed Care) | Attending: Orthopedic Surgery | Admitting: Orthopedic Surgery

## 2017-03-24 ENCOUNTER — Encounter: Payer: Self-pay | Admitting: Orthopedic Surgery

## 2017-03-24 ENCOUNTER — Encounter: Payer: Self-pay | Admitting: Psychiatry

## 2017-03-24 VITALS — BP 126/86 | Ht 68.0 in | Wt 283.0 lb

## 2017-03-24 DIAGNOSIS — M5416 Radiculopathy, lumbar region: Secondary | ICD-10-CM

## 2017-03-24 DIAGNOSIS — F422 Mixed obsessional thoughts and acts: Secondary | ICD-10-CM | POA: Insufficient documentation

## 2017-03-24 DIAGNOSIS — M7061 Trochanteric bursitis, right hip: Secondary | ICD-10-CM

## 2017-03-24 DIAGNOSIS — M545 Low back pain, unspecified: Secondary | ICD-10-CM

## 2017-03-24 DIAGNOSIS — F603 Borderline personality disorder: Secondary | ICD-10-CM | POA: Insufficient documentation

## 2017-03-24 DIAGNOSIS — F3341 Major depressive disorder, recurrent, in partial remission: Secondary | ICD-10-CM

## 2017-03-24 MED ORDER — PREGABALIN 150 MG PO CAPS *A*
150.0000 mg | ORAL_CAPSULE | Freq: Two times a day (BID) | ORAL | 0 refills | Status: DC
Start: 2017-03-24 — End: 2017-04-23

## 2017-03-24 MED ORDER — DULOXETINE HCL 20 MG PO CPEP *I*
20.0000 mg | DELAYED_RELEASE_CAPSULE | Freq: Every day | ORAL | 2 refills | Status: DC
Start: 2017-03-24 — End: 2017-06-06

## 2017-03-24 NOTE — Progress Notes (Signed)
Chief Complaint: Right lower extremity pain right hip trochanteric bursitis,      Interval Hx: Patient presents today for evaluation of her right hip trochanteric bursitis, right lower extremity pain.  States she not get any relief from the injection.  She is no longer needing crutches.  However she  continues to ambulate with a limp due to the pain she experiences that she locates the right buttock and right lateral hip region.  She also reports pain in her lumbar spine.  She has been experiencing discomfort since her fall.  She denies any changes in bowel or bladder function.  She denies any numbness or tingling in her right lower cavity.  She does report a history of numbness along the lateral left thigh.  She also has a history of fibromyalgia.       Allergies and medications and were reviewed.  Review of systems is negative except for above noted in HPI.      Physical Exam:    Constitutional: 41 year old female, alert, oriented, no distress.  Musculoskeletal: She presents ambulating in normal shoewear, no assistance, slight antalgic gait favoring the right lower extremity.  Evaluation of the lumbar spine reveals skin intact no ecchymosis swelling or deformity.  She has  tenderness to palpation throughout the lumbar spine region.  Tenderness along the lateral right hip, nontender along the trochanteric bursa, nontender along the groin.  Nontender over the anterior thigh.  She tolerates full hip motion.  Sensation is intact and symmetrical throughout her bilateral lower extremities.  Lower extremity deep tendon reflexes symmetrical and intact.  Her legs are warm and well-perfused, sensation intact.        Assessment/Diagnosis: 1) lumbar back pain with radiculopathy versus sciatica affecting her right lower extremity.   2)She was asymptomatic in regards to trochanteric bursitis on exam  today.      Plan:  All findings were reviewed with the patient.  This time I offered her a renewal for formal therapy which she  declined stating that this is been aggravating her symptoms.  I recommended follow-up in the spine division for further evaluation.  This appointment will be set up upon her checked out today.  In the interim.  She'll continue modifying her activities, she will switch to trialing Aleve for pain, alternating ice/heat.  All questions were invited and answered.
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
## 2017-03-24 NOTE — Progress Notes (Signed)
Orthopedic Sports/Spine  PT Note    Kaylee Harris   [id number]     Diagnosis: R Hip Traumatic Bursitis     Subjective:  Pain Score:  4  6Pain: Unchanged; patient continues to report pain in her lateral R hip into Iliac crest and into groin with standing (WBing) and with sitting; she has not felt any change in her pain level or function for that matter    Objective:  ROM - Unchanged, Right, Hip,   Strength - Unchanged  Function: - Unchanged - she cannot do R standing hip extension because she cannot WB on the R without a lot of hip pain; she also cannot do Bird dogs (quadruped position) kneeling on the R because of hip pain.   Education:  Updated HEP, Verbal cues for ther ex, Manual cues for ther ex, Spine ed concepts    Objective       Patient Specific Functional Scale    Activities rated 0-10, where 0 is the inability to perform task  Activity 02/27/17 03/24/17   Walking  4 4   Ascending Stairs 5 5   Descending Stairs 6 6   Squatting 4 4   Driving     Getting in / out of car 7 5   Into/out of bed  5 7        Total 31/60 33/60     Total score = sum of the activity scores / number of activities  Minimal detectable change for average score = 2 points  Minimal detectable change for single activity score = 3 points      Nu stepper 10' level 1   PPT 5" x 20   Bridges I 5" x 20   adductor squeezes 3" x 20   Lateral HS stretch 30" x 3   Front planks I 10" x 5   Standing hip extension 2 x 10   Quadratus Lumorum stretch (R only) 15" x 3   Bird dogs 5" x 10       Wall slide squats with ball xxx       Long axis distraction to r hip  Trial = sharp pain in lateral hip       Treatment:  Ther Exercise per flowsheet, Ther Exercise per flowsheet, added per bold font    Assessment:   Patient may have suffered a labral or glut medius tear to her R hip from  injury from her fall (?) I don't see major signs that  her lumbar spine is   a pain generator, but this cannot be ruled out just yet. She has not responded to PT to this point after 4  visits. She is compliant with her HEP       Plan of Care:  Continue per Plan of care - Modify: patient has f/u appt with referring PA. Will await for further POC    Thank you for referring this patient to [facility] Visit 4   Treatment Start Time 1230   Treatment End Time 120   Total Minutes of treatment 50       Total Non-Treatment time(rest, etc)    Total Service Based Min. of Treatment    Total Time-Based minutes of treatment 50           Service-Based Procedures/ Modalities    Evaluation    Re-evaluation  Traction, mechanical    Electric stimulation (unattended)    Whirlpool    Vasopneumatic device    Total service-based billable procedures        Time-Based  Procedures / Modalities    Electric stimulation (manual)    Iontophoresis    Contrast baths    Ultrasound    Physical Performance Test    Therapeutic ex 50   Neuromuscular reed    Gait training, including stairs    Manual Therapy    Therapeutic Activities    Total Time-Based Billable Procedures 50           FUNCTIONAL REPORTING for PT     GOAL STATUS  Mobility Walking Moving Around /  Mobility GOAL status  G8979  CH  0 % impaired, limited or restricted    CURRENT or DISCHARGE STATUS  CURRENT STATUS  Mobility Walking Moving Around / CURRENT status G8978   CL  60 - < 80% impaired, limited, restricted      Recorded on encounter:YES   POC DATE: 02/27/17

## 2017-03-28 ENCOUNTER — Inpatient Hospital Stay
Admission: EM | Admit: 2017-03-28 | Discharge: 2017-04-01 | DRG: 759 | Disposition: A | Discharge status: Home Health | Payer: Medicare (Managed Care) | Source: Ambulatory Visit | Attending: Obstetrics and Gynecology | Admitting: Obstetrics and Gynecology

## 2017-03-28 ENCOUNTER — Encounter: Payer: Self-pay | Admitting: Obstetrics and Gynecology

## 2017-03-28 ENCOUNTER — Ambulatory Visit
Admission: AD | Admit: 2017-03-28 | Discharge: 2017-03-28 | Disposition: A | Discharge status: Other Acute Inpatient Hospital | Payer: Medicare (Managed Care) | Source: Ambulatory Visit | Attending: Palliative Care | Admitting: Palliative Care

## 2017-03-28 DIAGNOSIS — R509 Fever, unspecified: Secondary | ICD-10-CM

## 2017-03-28 DIAGNOSIS — Z87891 Personal history of nicotine dependence: Secondary | ICD-10-CM

## 2017-03-28 DIAGNOSIS — E1165 Type 2 diabetes mellitus with hyperglycemia: Secondary | ICD-10-CM

## 2017-03-28 DIAGNOSIS — N764 Abscess of vulva: Secondary | ICD-10-CM | POA: Insufficient documentation

## 2017-03-28 DIAGNOSIS — Z9641 Presence of insulin pump (external) (internal): Secondary | ICD-10-CM | POA: Diagnosis present

## 2017-03-28 DIAGNOSIS — E785 Hyperlipidemia, unspecified: Secondary | ICD-10-CM | POA: Diagnosis present

## 2017-03-28 DIAGNOSIS — N907 Vulvar cyst: Secondary | ICD-10-CM

## 2017-03-28 DIAGNOSIS — F319 Bipolar disorder, unspecified: Secondary | ICD-10-CM | POA: Diagnosis present

## 2017-03-28 DIAGNOSIS — M797 Fibromyalgia: Secondary | ICD-10-CM | POA: Diagnosis present

## 2017-03-28 DIAGNOSIS — F419 Anxiety disorder, unspecified: Secondary | ICD-10-CM | POA: Diagnosis present

## 2017-03-28 DIAGNOSIS — E119 Type 2 diabetes mellitus without complications: Secondary | ICD-10-CM | POA: Diagnosis present

## 2017-03-28 DIAGNOSIS — Z794 Long term (current) use of insulin: Secondary | ICD-10-CM

## 2017-03-28 DIAGNOSIS — N762 Acute vulvitis: Principal | ICD-10-CM | POA: Diagnosis present

## 2017-03-28 DIAGNOSIS — R102 Pelvic and perineal pain: Secondary | ICD-10-CM

## 2017-03-28 LAB — BASIC METABOLIC PANEL
Anion Gap: 13 (ref 7–16)
CO2: 22 mmol/L (ref 20–28)
Calcium: 9.5 mg/dL (ref 8.8–10.2)
Chloride: 105 mmol/L (ref 96–108)
Creatinine: 0.77 mg/dL (ref 0.51–0.95)
GFR,Black: 109 *
GFR,Caucasian: 95 *
Glucose: 149 mg/dL — ABNORMAL HIGH (ref 60–99)
Lab: 10 mg/dL (ref 6–20)
Potassium: 4.1 mmol/L (ref 3.3–5.1)
Sodium: 140 mmol/L (ref 133–145)

## 2017-03-28 LAB — CBC AND DIFFERENTIAL
Baso # K/uL: 0.1 10*3/uL (ref 0.0–0.1)
Basophil %: 0.7 %
Eos # K/uL: 0.2 10*3/uL (ref 0.0–0.4)
Eosinophil %: 1.3 %
Hematocrit: 41 % (ref 34–45)
Hemoglobin: 13.3 g/dL (ref 11.2–15.7)
IMM Granulocytes #: 0.1 10*3/uL (ref 0.0–0.1)
IMM Granulocytes: 0.6 %
Lymph # K/uL: 2.8 10*3/uL (ref 1.2–3.7)
Lymphocyte %: 23.4 %
MCH: 28 pg (ref 26–32)
MCHC: 32 g/dL (ref 32–36)
MCV: 87 fL (ref 79–95)
Mono # K/uL: 0.7 10*3/uL (ref 0.2–0.9)
Monocyte %: 5.5 %
Neut # K/uL: 8.2 10*3/uL — ABNORMAL HIGH (ref 1.6–6.1)
Nucl RBC # K/uL: 0 10*3/uL (ref 0.0–0.0)
Nucl RBC %: 0 /100 WBC (ref 0.0–0.2)
Platelets: 367 10*3/uL (ref 160–370)
RBC: 4.8 MIL/uL (ref 3.9–5.2)
RDW: 13.3 % (ref 11.7–14.4)
Seg Neut %: 68.5 %
WBC: 11.9 10*3/uL — ABNORMAL HIGH (ref 4.0–10.0)

## 2017-03-28 LAB — POCT GLUCOSE: Glucose POCT: 117 mg/dL — ABNORMAL HIGH (ref 60–99)

## 2017-03-28 MED ORDER — GLUCOSE 40 % PO GEL *I*
15.0000 g | ORAL | Status: DC | PRN
Start: 2017-03-28 — End: 2017-04-01

## 2017-03-28 MED ORDER — DULOXETINE HCL 20 MG PO CPEP *I*
20.0000 mg | DELAYED_RELEASE_CAPSULE | Freq: Every day | ORAL | Status: DC
Start: 2017-03-28 — End: 2017-04-01
  Administered 2017-03-29 – 2017-04-01 (×4): 20 mg via ORAL
  Filled 2017-03-28 (×7): qty 1

## 2017-03-28 MED ORDER — INSULIN LISPRO (HUMAN) 100 UNIT/ML IJ/SC SOLN *WRAPPED*
10.0000 [IU] | Freq: Three times a day (TID) | SUBCUTANEOUS | Status: DC
Start: 2017-03-28 — End: 2017-03-29

## 2017-03-28 MED ORDER — METOPROLOL SUCCINATE 25 MG PO TB24 *I*
25.0000 mg | ORAL_TABLET | Freq: Every day | ORAL | Status: DC
Start: 2017-03-28 — End: 2017-04-01
  Administered 2017-03-28 – 2017-04-01 (×5): 25 mg via ORAL
  Filled 2017-03-28 (×5): qty 1

## 2017-03-28 MED ORDER — OXYCODONE HCL 5 MG PO TABS *I*
5.0000 mg | ORAL_TABLET | Freq: Once | ORAL | Status: AC
Start: 2017-03-28 — End: 2017-03-28
  Administered 2017-03-28: 5 mg via ORAL
  Filled 2017-03-28: qty 1

## 2017-03-28 MED ORDER — MIRTAZAPINE 15 MG PO TABS *I*
15.0000 mg | ORAL_TABLET | Freq: Every evening | ORAL | Status: DC
Start: 2017-03-28 — End: 2017-04-01
  Administered 2017-03-28 – 2017-03-31 (×4): 15 mg via ORAL
  Filled 2017-03-28 (×5): qty 1

## 2017-03-28 MED ORDER — CETIRIZINE HCL 10 MG PO TABS *I*
10.0000 mg | ORAL_TABLET | Freq: Every day | ORAL | Status: DC
Start: 2017-03-28 — End: 2017-04-01
  Administered 2017-03-29 – 2017-04-01 (×4): 10 mg via ORAL
  Filled 2017-03-28 (×6): qty 1

## 2017-03-28 MED ORDER — ATORVASTATIN CALCIUM 20 MG PO TABS *I*
40.0000 mg | ORAL_TABLET | Freq: Every day | ORAL | Status: DC
Start: 2017-03-28 — End: 2017-04-01
  Administered 2017-03-28 – 2017-03-31 (×4): 40 mg via ORAL
  Filled 2017-03-28 (×2): qty 2
  Filled 2017-03-28: qty 1
  Filled 2017-03-28 (×2): qty 2

## 2017-03-28 MED ORDER — ONDANSETRON HCL 2 MG/ML IV SOLN *I*
4.0000 mg | Freq: Two times a day (BID) | INTRAMUSCULAR | Status: DC | PRN
Start: 2017-03-28 — End: 2017-03-29
  Administered 2017-03-28: 4 mg via INTRAVENOUS
  Filled 2017-03-28: qty 2

## 2017-03-28 MED ORDER — ONDANSETRON HCL 2 MG/ML IV SOLN *I*
4.0000 mg | Freq: Once | INTRAMUSCULAR | Status: AC
Start: 2017-03-28 — End: 2017-03-28
  Administered 2017-03-28: 4 mg via INTRAVENOUS
  Filled 2017-03-28: qty 2

## 2017-03-28 MED ORDER — GLUCAGON HCL (RDNA) 1 MG IJ SOLR *WRAPPED*
1.0000 mg | INTRAMUSCULAR | Status: DC | PRN
Start: 2017-03-28 — End: 2017-04-01

## 2017-03-28 MED ORDER — PREGABALIN 150 MG PO CAPS *I*
150.0000 mg | ORAL_CAPSULE | Freq: Two times a day (BID) | ORAL | Status: DC
Start: 2017-03-28 — End: 2017-04-01
  Administered 2017-03-28 – 2017-04-01 (×8): 150 mg via ORAL
  Filled 2017-03-28 (×8): qty 1

## 2017-03-28 MED ORDER — HYDROMORPHONE HCL PF 0.5 MG/0.5 ML IJ SOLN *I*
0.5000 mg | Freq: Once | INTRAMUSCULAR | Status: AC
Start: 2017-03-28 — End: 2017-03-28
  Administered 2017-03-28: 0.5 mg via INTRAVENOUS
  Filled 2017-03-28: qty 0.5

## 2017-03-28 MED ORDER — CLINDAMYCIN PHOSPHATE IN D5W 600 MG/50ML IV SOLN *I*
600.0000 mg | Freq: Three times a day (TID) | INTRAVENOUS | Status: DC
Start: 2017-03-28 — End: 2017-03-29
  Administered 2017-03-28: 600 mg via INTRAVENOUS
  Filled 2017-03-28 (×2): qty 50

## 2017-03-28 MED ORDER — LAMOTRIGINE 25 MG PO TABS *I*
150.0000 mg | ORAL_TABLET | Freq: Every day | ORAL | Status: DC
Start: 2017-03-28 — End: 2017-04-01
  Administered 2017-03-28 – 2017-04-01 (×5): 150 mg via ORAL
  Filled 2017-03-28: qty 2
  Filled 2017-03-28: qty 1
  Filled 2017-03-28: qty 2
  Filled 2017-03-28 (×2): qty 1
  Filled 2017-03-28: qty 2
  Filled 2017-03-28: qty 1
  Filled 2017-03-28: qty 2
  Filled 2017-03-28: qty 1
  Filled 2017-03-28 (×2): qty 2

## 2017-03-28 MED ORDER — DEXTROSE 50 % IV SOLN *I*
25.0000 g | INTRAVENOUS | Status: DC | PRN
Start: 2017-03-28 — End: 2017-04-01

## 2017-03-28 MED ORDER — INSULIN GLARGINE 100 UNIT/ML SC SOLN *WRAPPED*
30.0000 [IU] | Freq: Every evening | SUBCUTANEOUS | Status: DC
Start: 2017-03-28 — End: 2017-03-30
  Administered 2017-03-28 – 2017-03-29 (×2): 30 [IU] via SUBCUTANEOUS

## 2017-03-28 MED ORDER — MIDODRINE HCL 5 MG PO TABS *I*
10.0000 mg | ORAL_TABLET | Freq: Three times a day (TID) | ORAL | Status: DC
Start: 2017-03-28 — End: 2017-04-01
  Administered 2017-03-28 – 2017-04-01 (×10): 10 mg via ORAL
  Filled 2017-03-28 (×14): qty 2

## 2017-03-28 MED ORDER — PANTOPRAZOLE SODIUM 40 MG PO TBEC *I*
40.0000 mg | DELAYED_RELEASE_TABLET | Freq: Every day | ORAL | Status: DC
Start: 2017-03-28 — End: 2017-04-01
  Administered 2017-03-29 – 2017-04-01 (×4): 40 mg via ORAL
  Filled 2017-03-28 (×5): qty 1

## 2017-03-28 MED ORDER — INSULIN LISPRO (HUMAN) 100 UNIT/ML IJ/SC SOLN *WRAPPED*
0.0000 [IU] | Freq: Three times a day (TID) | SUBCUTANEOUS | Status: DC
Start: 2017-03-29 — End: 2017-04-01
  Administered 2017-03-29 – 2017-03-30 (×6): 11 [IU] via SUBCUTANEOUS
  Administered 2017-03-31 – 2017-04-01 (×3): 12 [IU] via SUBCUTANEOUS

## 2017-03-28 MED ORDER — ALBUTEROL SULFATE HFA 108 (90 BASE) MCG/ACT IN AERS *I*
1.0000 | INHALATION_SPRAY | Freq: Four times a day (QID) | RESPIRATORY_TRACT | Status: DC | PRN
Start: 2017-03-28 — End: 2017-04-01
  Filled 2017-03-28: qty 8

## 2017-03-28 MED ORDER — CLINDAMYCIN PHOSPHATE IN D5W 600 MG/50ML IV SOLN *I*
600.0000 mg | Freq: Once | INTRAVENOUS | Status: DC
Start: 2017-03-28 — End: 2017-03-28

## 2017-03-28 MED ORDER — ACETAMINOPHEN 325 MG PO TABS *I*
650.0000 mg | ORAL_TABLET | ORAL | Status: DC | PRN
Start: 2017-03-28 — End: 2017-04-01
  Administered 2017-03-29 – 2017-04-01 (×4): 650 mg via ORAL
  Filled 2017-03-28 (×5): qty 2

## 2017-03-28 NOTE — ED Provider Notes (Addendum)
History     Chief Complaint   Patient presents with    Abscess     abscess on the labia on the right side and going into the glands.  pt went to urgent care and was told to go to emergency.     This is a 41 yo female with a PMH significant for uncontrolled DM, hx of left labial cyst (required I&D, IV abx), anxiety/depression, asthma, GERD, HTN, hyperlipidemia and migraine headaches who presents complaining of a right labial abscess that appeared yesterday and is getting worse with associated fever and nausea. She states that when she had a large cyst like this on the left she required admission, I&D and IV antibiotics so she became concerned and quickly came in for evaluation when she got a fever. + nausea, no vomiting or diarrhea.         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 Lower abdomen with cellulitis changes, wound probed and purulent material expressed.  Had PICC line for "multiple infiltrations" of what? D/c-ed home on 10d of Augmentin 9/11.      Anginal pain     Anxiety     Arthritis     Asthma     Depression     Diabetes mellitus     Previously on SU and metformin, now diet controlled    Diverticulitis 09/2010    Dysfunctional uterine bleeding     Fibromyalgia     GERD (gastroesophageal reflux disease)     GERD (gastroesophageal reflux disease) 10/04/2016    High blood pressure     Hyperlipidemia     Liver disease     fatty liver     Long term (current) use of insulin, Dermal Adhesed V-Go Insulin Delivery Device 10/04/2016    Migraine     Neuromuscular disorder     POTS (postural orthostatic tachycardia syndrome)     Sebaceous cyst of breast     right axilla    TMJ click, left 05/09/863    Varicella         Patient Active Problem List   Diagnosis Code     DM (diabetes mellitus), type 2, uncontrolled E11.65    Migraine with aura G43.109    Asthma J45.909    Hyperlipemia E78.5    Fibromyalgia M79.7    Syncope and collapse- recurrent, working diagnosis vascular vs psychogenic. 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    IUD (intrauterine device) in place Z97.5    IBS (irritable bowel syndrome) K58.9    Mixed obsessional thoughts and acts F42.2    Carpal tunnel syndrome G56.00    Meralgia paresthetica of left side G57.12    No diabetic retinopathy in both eyes Z03.89    At risk for abuse of opiates Z91.89    History of repeated overdose Z91.89    Borderline personality disorder F60.3    Fatty liver disease, nonalcoholic H84.6    GERD (gastroesophageal reflux disease) N62.9    TMJ click, left B28.413    Anxiety F41.9    Bipolar 1 disorder F31.9    POTS (postural orthostatic tachycardia syndrome) R00.0, I95.1    Traumatic hemorrhage of right cerebrum K44.010U  Diverticulitis K57.92    Long term (current) use of insulin, Dermal Adhesed V-Go Insulin Delivery Device Z79.4    Morbid obesity with BMI of 40.0-44.9, adult E66.01, Z68.41    Hematuria R31.9    Right hip pain M25.551            Past Surgical History:   Procedure Laterality Date    APPENDECTOMY  2016    arthroscopic shoulder surgery Right 2010    Related to lifting    CARDIAC CATHETERIZATION  09/2011    negative    COLON SURGERY      DILATION AND CURETTAGE OF UTERUS  2005, 2007    x2 for menorraghia    LEFT COLECTOMY  01/03/14    Dr Tresa Res    loop recorder  Feb 03 2014    loop recorder removal      PR COLONOSCOPY THRU COLOTOMY N/A 02/16/2016    Procedure: COLONOSCOPY;  Surgeon: Kelly Splinter, MD;  Location: [facility];  Service: GI    PR CYSTOURETHROSCOPY,BIOPSY N/A 10/17/2016    Procedure: CYSTOSCOPY BLADDER ;  Surgeon: Ed Blalock, MD;  Location: [facility];  Service: Urology    PR EDG TRANSORAL  BIOPSY SINGLE/MULTIPLE N/A 10/29/2016    Procedure: EGD;  Surgeon: Kelly Splinter, MD;  Location: [facility];  Service: GI    TONSILLECTOMY       Family History   Problem Relation Age of Onset    Hypertension Father     Diabetes Father     Kidney Disease Father     Elevated lipids Father     Heart attack Father 3    Other Father         PVD    Heart Disease Father     Hypertension Mother     Elevated lipids Mother     Heart attack Mother 68    Diabetes Mother     Eczema Mother     Psoriasis Mother     Heart Disease Mother     Hypertension Brother     Heart Disease Brother 23        prinzmetal's angina    Heart Disease Sister         currently having work up    [place] Sister     Hypertension Sister     Breast cancer Maternal Grandmother     Stroke Other     Cancer Sister     Thyroid disease Sister     Anesth problems Neg Hx           Social History   Substance Use Topics    Smoking status: Former Smoker     Packs/day: 0.50     Years: 3.00     Types: Cigarettes    Smokeless tobacco: Never Used      Comment: quit age late 21s    Alcohol use 0.0 oz/week      Comment: 0-1 drink/ month     Living Situation     Questions Responses    Patient lives with Spouse    Comment: Lives with Wife, Shari     Homeless No    Caregiver for other family member No    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   Gastrointestinal: Negative for abdominal pain.   Genitourinary: Positive for pelvic pain. Negative for dysuria, flank pain, frequency, hematuria, vaginal bleeding and vaginal discharge.   All other systems reviewed and are negative.      Physical Exam     Triage Vitals  Triage Start: Start, (03/28/17 1546)   First Recorded BP: 148/85, Temp: 36.4 C (97.5 F), Temp src: TEMPORAL Oxygen Therapy SpO2: 100 %,  Oximetry Source: Rt Hand, O2 Device: None (Room air), Heart Rate: 79, (03/28/17 1548)  .  First Pain Reported  0-10 Scale: 5, Pain Location/Orientation: Other (Comment);Groin (right side of labia), Pain Descriptors: Aching, (03/28/17 1548)       Physical Exam   Constitutional: She is oriented to person, place, and time. She appears well-developed and well-nourished. No distress.   HENT:   Head: Normocephalic and atraumatic.   Right Ear: External ear normal.   Left Ear: External ear normal.   Nose: Nose normal.   Mouth/Throat: Oropharynx is clear and moist.   Eyes: Pupils are equal, round, and reactive to light. Conjunctivae and EOM are normal.   Neck: Normal range of motion. Neck supple.   Cardiovascular: Normal rate, regular rhythm and normal heart sounds.    No murmur heard.  Pulmonary/Chest: Effort normal and breath sounds normal. She has no wheezes. She has no rales.   Abdominal: Soft. Bowel sounds are normal. She exhibits no distension. There is no tenderness. There is no rebound and no guarding.   Genitourinary:       Pelvic exam was performed with patient prone. There is tenderness on the right labia.   Genitourinary Comments: Large tender right labial cyst palpable    Musculoskeletal: Normal range of motion. She exhibits no edema.   Lymphadenopathy:     She has no cervical adenopathy.        Right: Inguinal adenopathy present.   Neurological: She is alert and oriented to person, place, and time.   Skin: Skin is warm. She is not diaphoretic.   Psychiatric: She has a normal mood and affect. Her behavior is normal. Judgment and thought content normal.   Nursing note and vitals reviewed.      Medical Decision Making      Amount and/or Complexity of Data Reviewed  Clinical lab tests: ordered and reviewed  Review and summarize past medical records: yes  Discuss the patient with other providers: yes        Initial Evaluation:  ED First Provider Contact     Date/Time Event User Comments    03/28/17 1652 ED First  Provider Contact CHARNO, CHRYSA Initial Face to Face Provider Contact          Patient seen by me on arrival date of 03/28/2017.    Assessment:  41 y.o.female comes to the ED with right labial cyst since yesterday, hx of fever.       Differential Diagnosis includes: right labial abscess vs cyst, cellulitis     Plan: cbc, bmp, GYN consult   Labs Reviewed   CBC AND DIFFERENTIAL - Abnormal; Notable for the following:        Result Value    WBC 11.9 (*)     Neut # K/uL 8.2 (*)     All other components within normal limits   BASIC METABOLIC PANEL - Abnormal; Notable for the following:     Glucose 149 (*)     All other components  within normal limits     Patient seen by GYN team and will admit for IV Clindamycin and pain control.          [place], PA    APP Review:    I had face-to-face interaction with the patient on 03/28/2017.    I was asked by APP to see this patient due to the complexity of the current medical presentation.      I have reviewed and agree with the above documentation and, in addition:    The history is notable for 41 year old female with a PMH of insulin-requiring type II DM (on an insulin pump), asthma, GERD, bipolar disorder and morbid obesity who presents to the ED for a right labial abscess..    Our plan is Clindamycin IV, ObGyn consult, analgesia.    Author:  Elly Modena, DO       [facility] [place], [place]  03/28/17 [place], [place], [place]  03/29/17 1616

## 2017-03-28 NOTE — Progress Notes (Signed)
03/28/17 2000   [facility] Patient Class Review   Patient Class Review Observation

## 2017-03-28 NOTE — ED Triage Notes (Signed)
Patient reports cyst on right labia since yesterday and is much bigger since yesterday. Last night had chills and sweating. Cyst is not draining.        Triage Note   William Dalton, RN

## 2017-03-28 NOTE — H&P (Addendum)
OB/GYN PGY1 ED Consult    Consult Requested By: Kaylee Harris  Consult Question: concern for labial abscess    CC: Right labial abscess    HPI: 41 y.o. female with multiple medical comorbidities including uncontrolled Type 2 DM who presents with right-sided labial abscess. Patient notes yesterday she developed and abscess on the right side with rapidly enlarged. She had a fever and chills last evening and woke up with her clothing soaked. She has a history of recurrent labial abscesses. She usually gets small abscesses that resolve spontaneously with warm compresses. Most recently had a small left labial abscess that opened earlier this week and is draining. The patient was seen at Urgent Care earlier today and noted to have a temp of 101. It was suggested she come to the ED for further evaluation. Of note, about 3 years ago patient had a similar left-labial abscess which required incision and drainage with packing and 3 days of IV antibiotics. Pt notes this abscess seems similar to the one that required drainage.     In the ED, pt is afebrile. She last took Tylenol at 2 pm. WBC 11.9. No other lab abnormalities. HbA1c 11 in October.     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 Lower abdomen with cellulitis changes, wound probed and purulent material expressed.  Had PICC line for "multiple infiltrations" of what? D/c-ed home on 10d of Augmentin 9/11.      Anginal pain     Anxiety     Arthritis     Asthma     Depression     Diabetes mellitus     Previously on SU and metformin, now diet controlled    Diverticulitis 09/2010    Dysfunctional uterine bleeding     Fibromyalgia     GERD (gastroesophageal reflux disease)     GERD (gastroesophageal reflux disease) 10/04/2016    High blood pressure     Hyperlipidemia     Liver disease     fatty liver     Long term (current) use of insulin, Dermal  Adhesed V-Go Insulin Delivery Device 10/04/2016    Migraine     Neuromuscular disorder     POTS (postural orthostatic tachycardia syndrome)     Sebaceous cyst of breast     right axilla    TMJ click, left 12/10/5641    Varicella         Past Surgical History:  Past Surgical History:   Procedure Laterality Date    APPENDECTOMY  2016    arthroscopic shoulder surgery Right 2010    Related to lifting    CARDIAC CATHETERIZATION  09/2011    negative    COLON SURGERY      DILATION AND CURETTAGE OF UTERUS  2005, 2007    x2 for menorraghia    LEFT COLECTOMY  01/03/14    Dr Tresa Res    loop recorder  Feb 03 2014    loop recorder removal      PR COLONOSCOPY THRU COLOTOMY N/A 02/16/2016    Procedure: COLONOSCOPY;  Surgeon: Kelly Splinter, MD;  Location: [facility];  Service: GI    PR CYSTOURETHROSCOPY,BIOPSY N/A 10/17/2016    Procedure: CYSTOSCOPY BLADDER ;  Surgeon: Ed Blalock, MD;  Location: [facility];  Service: Urology    PR EDG TRANSORAL BIOPSY SINGLE/MULTIPLE N/A 10/29/2016  Procedure: EGD;  Surgeon: Kelly Splinter, MD;  Location: [facility];  Service: GI    TONSILLECTOMY          Gynecologic History:  Menses: Patient has had a Mirena IUD in place for last 5 years, no longer gets menstrual periods.  Prior to Mirena pt has a hx of heavy menstrual bleeding and had 2 D&Cs.   Cramps: None  STD History: none  Current Birth control method: IUD: Mirena    Obstetrical History:  OB History   Gravida Para Term Preterm AB Living   0 0 0 0 0     SAB TAB Ectopic Multiple Live Births   0 0 0                 Social History:  Patient reports that she has quit smoking. Her smoking use included Cigarettes. She has a 1.50 pack-year smoking history. She has never used smokeless tobacco. She reports that she drinks alcohol. She reports that she currently engages in sexual activity and has had female partners. She reports using the following method of birth control/protection: IUD. She  reports that she does not use drugs.      Family History:  Family History   Problem Relation Age of Onset    Hypertension Father     Diabetes Father     Kidney Disease Father     Elevated lipids Father     Heart attack Father 45    Other Father         PVD    Heart Disease Father     Hypertension Mother     Elevated lipids Mother     Heart attack Mother 41    Diabetes Mother     Eczema Mother     Psoriasis Mother     Heart Disease Mother     Hypertension Brother     Heart Disease Brother 26        prinzmetal's angina    Heart Disease Sister         currently having work up    [place] Sister     Hypertension Sister     Breast cancer Maternal Grandmother     Stroke Other     Cancer Sister     Thyroid disease Sister     Anesth problems Neg Hx       Home Medications:  See Med Reconciliation    Allergies:  Allergies   Allergen Reactions    Morphine Anaphylaxis    Trazodone Anaphylaxis    Seasonal Allergies Itching    Other [Other] Other (See Comments)     Derma Bond(skin glue) pruritis/redness and c/o respiratory distress.   Received name: Other        Review of Systems:  Pertinent items are noted in HPI.  All other ROS negative unless otherwise noted in HPI.     Physical Exam:  Vitals:    03/28/17 1548   BP: 148/85   Pulse: 79   Temp: 36.4 C (97.5 F)   Weight: 128.4 kg (283 lb)   Height: 1.727 m (5\' 8" )       General: Appears stated age, well appearing and in NAD  HEENT: Normocephalic, atraumatic  Cardiovascular: Normotensive, RRR  Respiratory: On RA, non-labored respirations.  Abdomen: soft, non-tender, non-distended  Neurological: Grossly normal, 2+ DTRs  Pelvic exam: < 1 cm left labial abscess opened and draining, large right labial abscess extending along course of labia majora, indurated and  firm, tender to palpation. Tender to palpation along mons into right inguinal fold.   Extremities/Skin: No edema noted    Labs:    Recent Labs  Lab 03/28/17  1717   WBC 11.9*   Hemoglobin 13.3    Hematocrit 41   Platelets 367         Recent Labs  Lab 03/28/17  1717   Sodium 140   Potassium 4.1   Chloride 105   CO2 22   UN 10   Creatinine 0.77   Calcium 9.5   Glucose 149*       No results for input(s): ABORH in the last 168 hours.        Lab results: 10/17/16  1152 09/17/16  1558   Preg Test,UR POC Negative-Dilute urine specimens may cause false negative urine pregnancy results...  --    Preg,Serum  --  NEG      Assessment:  41 y.o. female with uncontrolled type II DM and recurrent labial abscesses presenting with R-sided indurated tender labial abscess, without fluctuance.     Plan:  - Admit to gynecology given pain, systemic symptoms, uncontrolled diabetes, and history of abscess requiring extensive drainage and IV antibiotics.   - Start IV clindamycin  - Monitor temps and abscess progression.   - At this point, does not appear there is an area of fluctuance that can be drained.   - Type 2 DM: Follow endocrinology regimen. 30 U Lantus this evening, 10 U lantus w/ meals w/ 1:40 correction > 140 mg/dl. Endocrine to see in am.     D/w Dr. Francella Solian. Onge and Dr. Zachery Dakins, MD  Obstetrics and Gynecology, PGY-1  Pager # [phone]

## 2017-03-28 NOTE — ED Notes (Signed)
OBGYN consult at bedside.

## 2017-03-28 NOTE — ED Notes (Signed)
03/28/17 1516   Expected [place]   ED Service [facility] Adult Call-in   PCP/Service Referral [facility]/Urgent care   Pt Info note/Reason for sending R labial cyst, fever, uncontrolled diabetic, last night chills,sweats, BP 142/80, T 36.7, P 77, R 16, O2 99%. Coming in for IV antibiotics.   Call reported to [facility]

## 2017-03-28 NOTE — Discharge Instructions (Signed)
Due to high concern for worsening labia abscess with fevers, and uncontrolled diabetes, advised that patient seeks further evaluation / possible workup/monitoring at [facility] Emergency Department.     Patient will be transported to the ED by private car    Triage RN called and report was given    Advised patient to remain NPO until further instructed

## 2017-03-28 NOTE — ED Notes (Signed)
Report Given To  [place] 5, RN      Descriptive Sentence / Reason for Admission   Labia Abcess       Active Issues / Relevant Events       To Do List  IV antibiotics, pain management      Anticipatory Guidance / Discharge Planning

## 2017-03-28 NOTE — Progress Notes (Signed)
I reviewed Medicare Outpatient Observation Notice with patient, she verbalized understanding and signed notice. Signed Original to patient and copy to chart.

## 2017-03-28 NOTE — UC Provider Note (Signed)
History     Chief Complaint   Patient presents with    Cyst     Patient is a 41 year old female who presents with complaints of a cyst to her right labia.  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  She states that yesterday she noticed that she had a cyst to the left labia for which she had been doing warm compresses, and it did drain on its own.      She then developed one on the right labia which has been worsening since yesterday.  She reports it as painful but not having any drainage from the area.  Last night she did have a fever of 101F.  She also had sweats and chills.      She states that she is a diabetic with uncontrolled blood sugars.  Her last A1c was 11 and her fasting glucose this morning was 210.    She has been trying warm compresses to the right labia but notes worsening pain and swelling radiating into the right groin area.    Patient states that about 2 years ago she did have an abscess of the left labia and required admission with IV antibiotics due to this.                     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 Lower abdomen with cellulitis changes, wound probed and purulent material expressed.  Had PICC line for "multiple infiltrations" of what? D/c-ed home on 10d of Augmentin 9/11.      Anginal pain     Anxiety     Arthritis     Asthma     Depression     Diabetes mellitus     Previously on SU and metformin, now diet controlled    Diverticulitis 09/2010    Dysfunctional uterine bleeding     Fibromyalgia     GERD (gastroesophageal reflux disease)     GERD (gastroesophageal reflux disease) 10/04/2016    High blood pressure     Hyperlipidemia     Liver disease     fatty liver     Long term (current) use of insulin, Dermal Adhesed V-Go Insulin Delivery Device 10/04/2016    Migraine     Neuromuscular disorder        POTS (postural orthostatic tachycardia syndrome)     Sebaceous cyst of breast     right axilla    TMJ click, left 09/08/2128    Varicella         Patient Active Problem List   Diagnosis Code    DM (diabetes mellitus), type 2, uncontrolled E11.65    Migraine with aura G43.109    Asthma J45.909    Hyperlipemia E78.5    Fibromyalgia M79.7    Syncope and collapse- recurrent, working diagnosis vascular vs psychogenic. 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    IUD (intrauterine device) in place Z97.5    IBS (irritable bowel syndrome) K58.9    Mixed obsessional thoughts and acts F42.2    Carpal tunnel syndrome G56.00    Meralgia paresthetica of left side G57.12    No diabetic retinopathy in both eyes Z03.89    At risk for abuse of opiates Z91.89    History of  repeated overdose Z91.89    Borderline personality disorder F60.3    Fatty liver disease, nonalcoholic J81.1    GERD (gastroesophageal reflux disease) B14.7    TMJ click, left W29.562    Anxiety F41.9    Bipolar 1 disorder F31.9    POTS (postural orthostatic tachycardia syndrome) R00.0, I95.1    Traumatic hemorrhage of right cerebrum S06.349A    Diverticulitis K57.92    Long term (current) use of insulin, Dermal Adhesed V-Go Insulin Delivery Device Z79.4    Morbid obesity with BMI of 40.0-44.9, adult E66.01, Z68.41    Hematuria R31.9    Right hip pain M25.551    Labial abscess N76.4            Past Surgical History:   Procedure Laterality Date    APPENDECTOMY  2016    arthroscopic shoulder surgery Right 2010    Related to lifting    CARDIAC CATHETERIZATION  09/2011    negative    COLON SURGERY      DILATION AND CURETTAGE OF UTERUS  2005, 2007    x2 for menorraghia    LEFT COLECTOMY  01/03/14    Dr Tresa Res    loop recorder  Feb 03 2014    loop recorder removal      PR COLONOSCOPY THRU COLOTOMY N/A 02/16/2016    Procedure: COLONOSCOPY;  Surgeon: Kelly Splinter, MD;  Location: [place];  Service: GI    PR CYSTOURETHROSCOPY,BIOPSY N/A 10/17/2016    Procedure: CYSTOSCOPY BLADDER ;  Surgeon: Ed Blalock, MD;  Location: [facility];  Service: Urology    PR EDG TRANSORAL BIOPSY SINGLE/MULTIPLE N/A 10/29/2016    Procedure: EGD;  Surgeon: Kelly Splinter, MD;  Location: [place];  Service: GI    TONSILLECTOMY       Family History   Problem Relation Age of Onset    Hypertension Father     Diabetes Father     Kidney Disease Father     Elevated lipids Father     Heart attack Father 103    Other Father         PVD    Heart Disease Father     Hypertension Mother     Elevated lipids Mother     Heart attack Mother 56    Diabetes Mother     Eczema Mother     Psoriasis Mother     Heart Disease Mother     Hypertension Brother     Heart Disease Brother 105        prinzmetal's angina    Heart Disease Sister         currently having work up    [place] Sister     Hypertension Sister     Breast cancer Maternal Grandmother     Stroke Other     Cancer Sister     Thyroid disease Sister     Anesth problems Neg Hx           Social History   Substance Use Topics    Smoking status: Former Smoker     Packs/day: 0.50     Years: 3.00     Types: Cigarettes    Smokeless tobacco: Never Used      Comment: quit age late 19s    Alcohol use 0.0 oz/week      Comment: 0-1 drink/ month     Living Situation     Questions Responses    Patient lives  with Spouse    Comment: Lives with Wife, [name]     Homeless No    Caregiver for other family member No    External Services Mental Health Services    Comment: [place]     Employment Disabled    Domestic Violence Risk                 Review of Systems   Review of Systems   Constitutional: Positive for chills, diaphoresis and fever.   HENT: Negative.    Gastrointestinal: Negative for abdominal pain, nausea and vomiting.   Musculoskeletal: Negative for arthralgias and myalgias.   Skin: Positive for wound  (Abscess left and right labia). Negative for pallor and rash.   Psychiatric/Behavioral: The patient is not nervous/anxious.        Physical Exam   Triage Vitals  Triage Start: Start, (03/28/17 1413)   First Recorded BP: 142/82, Resp: 16, Temp: 36.7 C (98.1 F), Temp src: TEMPORAL Oxygen Therapy SpO2: 99 %, O2 Device: None (Room air), Heart Rate: 77, (03/28/17 1416)  .  First Pain Reported  0-10 Scale: 6, Pain Location/Orientation:  (cyst), (03/28/17 1416)       Physical Exam   Constitutional: She is oriented to person, place, and time. She appears well-developed. She does not appear ill. No distress.   HENT:   Head: Normocephalic and atraumatic.   Right Ear: Hearing normal.   Left Ear: Hearing normal.   Eyes: Conjunctivae are normal.   Neck: Neck supple. No neck rigidity.   Cardiovascular: Intact distal pulses.    Pulmonary/Chest: Effort normal. No stridor. No respiratory distress.   Abdominal: She exhibits no distension.   Genitourinary:       There is tenderness on the right labia.   Lymphadenopathy:        Right: Inguinal (with tenderness) adenopathy present.   Neurological: She is alert and oriented to person, place, and time.   Skin: Skin is warm and dry. She is not diaphoretic. No pallor.   Psychiatric: She has a normal mood and affect. Her speech is normal and behavior is normal. Judgment and thought content normal. Cognition and memory are normal.   Nursing note and vitals reviewed.       Medical Decision Making      Amount and/or Complexity of Data Reviewed  Review and summarize past medical records: yes        Initial Evaluation:  ED First Provider Contact     Date/Time Event User Comments    03/28/17 1439 ED First Provider Contact Rodena Goldmann Initial Face to Face Provider Contact          Patient was seen on: 03/28/2017        Assessment:  41 y.o.female comes [facility] the [facility] with complaints of left and right labial abscesses.  The right one has become increasingly painful, swollen, red and  the pain is radiating into the right groin region.  She is also having fevers with Tmax of 101F.  She reports having chills and sweats.  With history of uncontrolled diabetes concern for worsening infection and further evaluation is warranted in the emergency room.  Patient is agreeable to plan.     Differential Diagnosis includes:    Abscess  Cellulitis  Folliculitis  Sepsis      Plan:     Orders Placed This Encounter   No orders placed during this encounter.           Your  Final Diagnosis    ICD-10-CM ICD-9-CM   1. Abscess of right genital labia N76.4 616.4         Below are your home instructions:     Due to high concern for worsening labia abscess with fevers, and uncontrolled diabetes, advised that patient seeks further evaluation / possible workup/monitoring at [facility] Emergency Department.     Patient will be transported to the ED by private car    Triage RN called and report was given    Advised patient to remain NPO until further instructed            Please follow up with your Primary Care Doctor as listed below:    Follow-up Information     Go to  [facility]    Why:  For further evaluation of your symptoms  Contact information:  [address]  [place] [address]                   Thank you, NEIDY GUERRIERI, for visiting [facility].    If your condition fails to improve, worsens, or changes, please follow up with your primary care provider and/or return to the urgent care center for further evaluation               Final Diagnosis  Final diagnoses:   [N76.4] Abscess of right genital labia (Primary)         Herbert Spires, NP          Leanora Cover, NP  03/28/17 2209

## 2017-03-28 NOTE — Plan of Care (Signed)
Endocrinology was called for  Kaylee Harris as she is being admitted for labial abscess and she is on on insulin pump    I spoke with her over the phone   She is on VGO-30 for the last 2 years  Has been on insulin for 5 years  previously on  basal bolus regimen   She was diagnosed when she was in her 34 s and she was initially on oral hypoglycemia agents  She takes 6 clicks with meals which is equivalent to 12 units of insulin     Last HbA1c was 11% , follows with Dr April Manson office    She checks her BG 4 times per day  FBG ranges between 120-350 mg/dl  Before meal  it ranges between 130-220 mg/dl     She denied any hypoglycemia less than 70 mg/dl but she feel hypoglycemia symptoms when she is at 79 mg/dl     She put her insulin pump on today at 11:00 AM and she does not have her supplies with her   Discussed with patient for now will manage her with SC insulin basal regimen which she agreed and reported that she usually do that if get admitted    She has some nausea but no vomiting, she is feeling hungry and wants to eat dinner now as she has not eaten any thing today    Her BG was 149 mg/dl ata round 06:00 PM today     Assessment:  Poorly controlled T2DM on insulin pump VGO-30 , admitted with right labial abscess  She is planned for IV antibiotic now, no surgery is planned as of now     Plan:  Start on insulin Lantus 30 units SC tonight  Patient to remove her VGO- pump after she receive the Lantus injection   Humalog 10 units with meals plus correction scale of 1:40>140 mg/dl  Endocrinology team will see in AM

## 2017-03-29 ENCOUNTER — Other Ambulatory Visit: Payer: Self-pay | Admitting: Primary Care

## 2017-03-29 LAB — CBC AND DIFFERENTIAL
Baso # K/uL: 0.1 10*3/uL (ref 0.0–0.1)
Basophil %: 0.7 %
Eos # K/uL: 0.2 10*3/uL (ref 0.0–0.4)
Eosinophil %: 1.8 %
Hematocrit: 38 % (ref 34–45)
Hemoglobin: 12 g/dL (ref 11.2–15.7)
IMM Granulocytes #: 0.1 10*3/uL (ref 0.0–0.1)
IMM Granulocytes: 0.6 %
Lymph # K/uL: 4.1 10*3/uL — ABNORMAL HIGH (ref 1.2–3.7)
Lymphocyte %: 33.9 %
MCH: 28 pg (ref 26–32)
MCHC: 32 g/dL (ref 32–36)
MCV: 88 fL (ref 79–95)
Mono # K/uL: 0.9 10*3/uL (ref 0.2–0.9)
Monocyte %: 7 %
Neut # K/uL: 6.8 10*3/uL — ABNORMAL HIGH (ref 1.6–6.1)
Nucl RBC # K/uL: 0 10*3/uL (ref 0.0–0.0)
Nucl RBC %: 0 /100 WBC (ref 0.0–0.2)
Platelets: 354 10*3/uL (ref 160–370)
RBC: 4.4 MIL/uL (ref 3.9–5.2)
RDW: 13.4 % (ref 11.7–14.4)
Seg Neut %: 56 %
WBC: 12.1 10*3/uL — ABNORMAL HIGH (ref 4.0–10.0)

## 2017-03-29 LAB — POCT GLUCOSE
Glucose POCT: 122 mg/dL — ABNORMAL HIGH (ref 60–99)
Glucose POCT: 152 mg/dL — ABNORMAL HIGH (ref 60–99)
Glucose POCT: 158 mg/dL — ABNORMAL HIGH (ref 60–99)
Glucose POCT: 145 mg/dL — ABNORMAL HIGH (ref 60–99)
Glucose POCT: 181 mg/dL — ABNORMAL HIGH (ref 60–99)

## 2017-03-29 MED ORDER — ONDANSETRON HCL 2 MG/ML IV SOLN *I*
4.0000 mg | Freq: Four times a day (QID) | INTRAMUSCULAR | Status: AC | PRN
Start: 2017-03-29 — End: 2017-03-31
  Administered 2017-03-29 – 2017-03-30 (×5): 4 mg via INTRAVENOUS
  Filled 2017-03-29 (×6): qty 2

## 2017-03-29 MED ORDER — FLUTICASONE PROPIONATE 50 MCG/ACT NA SUSP *I*
1.0000 | Freq: Every day | NASAL | Status: DC
Start: 2017-03-29 — End: 2017-04-01
  Administered 2017-03-29 – 2017-04-01 (×4): 1 via NASAL
  Filled 2017-03-29: qty 16

## 2017-03-29 MED ORDER — OXYCODONE HCL 5 MG PO TABS *I*
5.0000 mg | ORAL_TABLET | ORAL | Status: DC | PRN
Start: 2017-03-29 — End: 2017-03-31
  Administered 2017-03-29 – 2017-03-31 (×8): 5 mg via ORAL
  Filled 2017-03-29 (×10): qty 1

## 2017-03-29 MED ORDER — IBUPROFEN 600 MG PO TABS *I*
600.0000 mg | ORAL_TABLET | Freq: Three times a day (TID) | ORAL | Status: DC
Start: 2017-03-29 — End: 2017-04-01
  Administered 2017-03-29 – 2017-04-01 (×8): 600 mg via ORAL
  Filled 2017-03-29 (×9): qty 1

## 2017-03-29 MED ORDER — BUSPIRONE HCL 15 MG PO TABS *I*
30.0000 mg | ORAL_TABLET | Freq: Two times a day (BID) | ORAL | Status: DC
Start: 2017-03-29 — End: 2017-04-01
  Administered 2017-03-29 – 2017-04-01 (×7): 30 mg via ORAL
  Filled 2017-03-29 (×2): qty 2
  Filled 2017-03-29: qty 6
  Filled 2017-03-29: qty 2
  Filled 2017-03-29: qty 6
  Filled 2017-03-29 (×2): qty 2
  Filled 2017-03-29 (×3): qty 6

## 2017-03-29 MED ORDER — OXYCODONE HCL 5 MG PO TABS *I*
5.0000 mg | ORAL_TABLET | Freq: Four times a day (QID) | ORAL | Status: DC | PRN
Start: 2017-03-29 — End: 2017-03-29
  Administered 2017-03-29 (×3): 5 mg via ORAL
  Filled 2017-03-29 (×3): qty 1

## 2017-03-29 MED ORDER — CLINDAMYCIN PHOSPHATE IN D5W 900 MG/50ML IV SOLN *I*
900.0000 mg | Freq: Three times a day (TID) | INTRAVENOUS | Status: DC
Start: 2017-03-29 — End: 2017-04-01
  Administered 2017-03-29 – 2017-04-01 (×10): 900 mg via INTRAVENOUS
  Filled 2017-03-29 (×14): qty 50

## 2017-03-29 NOTE — Consults (Signed)
Endocrine Consult H&P for Inpatients    Consult Requested by: Thompsoncarrillo, Raquel Sarna,*   Consult Question: T2DM on insulin pump      History of Present Illness:  Kaylee Harris is a 41 years old lady with PMH of T2DM, HLD, Asthma, GERD, Bipolar type 1 , morbid obesity, fibromyalgia, IBS and migraine who was admitted for right labial abscess and planned for medical management with antibiotics    Diabetes   diagnosed in her 33 s   managed by Dr April Manson   initially started on on oral hypoglycemia agents. Has been on insulin for 5 years  previously on basal bolus regimen and then switched to VGO-30 for the last 2 years    On the VGO-30 her basal insulin is 30 units and she takes 6 clicks with meals which is equivalent to 12 units of insulin     Last HbA1c was 11% in 10/18    Hypoglycemia: denies episodes.     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 Lower abdomen with cellulitis changes, wound probed and purulent material expressed.  Had PICC line for "multiple infiltrations" of what? D/c-ed home on 10d of Augmentin 9/11.      Anginal pain     Anxiety     Arthritis     Asthma     Depression     Diabetes mellitus     Previously on SU and metformin, now diet controlled    Diverticulitis 09/2010    Dysfunctional uterine bleeding     Fibromyalgia     GERD (gastroesophageal reflux disease)     GERD (gastroesophageal reflux disease) 10/04/2016    High blood pressure     Hyperlipidemia     Liver disease     fatty liver     Long term (current) use of insulin, Dermal Adhesed V-Go Insulin Delivery Device 10/04/2016    Migraine     Neuromuscular disorder     POTS (postural  orthostatic tachycardia syndrome)     Sebaceous cyst of breast     right axilla    TMJ click, left 11/08/1948    Varicella      Past Surgical History:   Procedure Laterality Date    APPENDECTOMY  2016    arthroscopic shoulder surgery Right 2010    Related to lifting    CARDIAC CATHETERIZATION  09/2011    negative    COLON SURGERY      DILATION AND CURETTAGE OF UTERUS  2005, 2007    x2 for menorraghia    LEFT COLECTOMY  01/03/14    Dr Tresa Res    loop recorder  Feb 03 2014    loop recorder removal      PR COLONOSCOPY THRU COLOTOMY N/A 02/16/2016    Procedure: COLONOSCOPY;  Surgeon: Kelly Splinter, MD;  Location: [place];  Service: GI    PR CYSTOURETHROSCOPY,BIOPSY N/A 10/17/2016    Procedure: CYSTOSCOPY BLADDER ;  Surgeon: Ed Blalock, MD;  Location: [place] MAIN OR;  Service: Urology  PR EDG TRANSORAL BIOPSY SINGLE/MULTIPLE N/A 10/29/2016    Procedure: EGD;  Surgeon: Kelly Splinter, MD;  Location: [place];  Service: GI    TONSILLECTOMY       Family History   Problem Relation Age of Onset    Hypertension Father     Diabetes Father     Kidney Disease Father     Elevated lipids Father     Heart attack Father 13    Other Father         PVD    Heart Disease Father     Hypertension Mother     Elevated lipids Mother     Heart attack Mother 27    Diabetes Mother     Eczema Mother     Psoriasis Mother     Heart Disease Mother     Hypertension Brother     Heart Disease Brother 71        prinzmetal's angina    Heart Disease Sister         currently having work up    [place] Sister     Hypertension Sister     Breast cancer Maternal Grandmother     Stroke Other     Cancer Sister     Thyroid disease Sister     Anesth problems Neg Hx      Social History     Social History    Marital status: Married     Spouse name: N/A    Number of children: N/A    Years of education: N/A     Social History Main Topics    Smoking status: Former Smoker     Packs/day:  0.50     Years: 3.00     Types: Cigarettes    Smokeless tobacco: Never Used      Comment: quit age late 73s    Alcohol use 0.0 oz/week      Comment: 0-1 drink/ month    Drug use: No    Sexual activity: Yes     Partners: Female     Patent examiner protection: IUD     Other Topics Concern    None     Social History Narrative    ** Merged History Encounter **         Married to [place], recently relocated back to [place] from [place] due to family stressors. 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        Allergies:   Allergies   Allergen Reactions    Morphine Itching    Trazodone Anaphylaxis    Seasonal Allergies Itching    Other [Other] Other (See Comments)     Derma Bond(skin glue) pruritis/redness and c/o respiratory distress.   Received name: Other       Active Hospital Medications:  Current Facility-Administered Medications   Medication Dose Route Frequency    ondansetron (ZOFRAN) injection 4 mg  4 mg Intravenous Q6H PRN    oxyCODONE (ROXICODONE) IR tablet 5 mg  5 mg Oral Q6H PRN    clindamycin (CLEOCIN) IVPB 900 mg  900 mg Intravenous Q8H    busPIRone (BUSPAR) tablet 30 mg  30 mg Oral BID    fluticasone (FLONASE) 50 MCG/ACT nasal spray 1 spray  1 spray Each Nare Daily    atorvastatin (LIPITOR) tablet 40 mg  40 mg Oral Daily with dinner    metoprolol (TOPROL-XL) 24 hr tablet 25 mg  25 mg Oral  Daily    acetaminophen (TYLENOL) tablet 650 mg  650 mg Oral Q4H PRN    albuterol HFA (PROVENTIL, VENTOLIN, PROAIR HFA) inhaler 1-2 puff  1-2 puff Inhalation Q6H PRN    cetirizine (ZyrTEC) tablet 10 mg  10 mg Oral Daily    DULoxetine (CYMBALTA) DR capsule 20 mg  20 mg Oral Daily    lamoTRIgine (LaMICtal) tablet 150 mg  150 mg Oral Daily    midodrine (PROAMATINE) tablet 10 mg  10 mg Oral TID    mirtazapine (REMERON) tablet 15 mg  15 mg Oral Nightly    pantoprazole (PROTONIX) EC tablet 40 mg  40 mg Oral Daily    pregabalin (LYRICA) capsule 150 mg  150 mg Oral 2  times per day    insulin glargine (LANTUS) injection 30 Units  30 Units Subcutaneous Nightly    insulin lispro (HumaLOG,ADMELOG) injection 0-30 Units  0-30 Units Subcutaneous TID WC    dextrose (GLUTOSE) 40 % oral gel 15 g  15 g Oral PRN    dextrose 50% (0.5 g/mL) injection 25 g  25 g Intravenous PRN    glucagon (GLUCAGEN) injection 1 mg  1 mg Intramuscular PRN       Review of Systems:  CONSTITUTIONAL:  Appetite good, no  weight loss. Has sweating   HEAD: No headache, dizzinessEYES:No vision changes or eye pain  CV: No chest pain, shortness of breath, or edema.  RESPIRATORY:  No cough, wheezing, or dyspnea  GI:  No nausea, vomiting, or change in bowel habits, has right lower abdominal pain  GU:  No dysuria, or hematuria   NEURO:  No mental status changes, motor weakness, or sensory changes  ENDOCRINE:  No polyuria, polydipsia.      Physical Examination:  Blood pressure 140/80, pulse 72, temperature 36.9 C (98.4 F), temperature source Temporal, resp. rate 16, height 1.727 m (5\' 8" ), weight 128.4 kg (283 lb), SpO2 95 %.  GENERAL APPEARANCE: well developed, well nourished, no acute distress  HEENT: clear sclera, pink conjunctiva  NECK: supple, no palpable cervical lymphadenopathy  HEART: RRR, normal S1, S2, no MRG  CHEST: lungs clear to auscultation bilaterally  ABDOMEN: soft, nondistended, , tenderness at the right lower abdomen  EXTREMITIES: no clubbing, cyanosis, or edema. Pedal pulses 2+ bilaterally. Dry feet and heels  NEUROLOGICAL: Alert and oriented.    POCT BG Results:    Recent Labs  Lab 03/29/17  0833 03/29/17  0250 03/28/17  2200   Glucose POCT 152* 122* 117*       Lab Results:     Recent Labs  Lab 03/28/17  1717   Sodium 140   Potassium 4.1   Chloride 105   CO2 22   UN 10   Creatinine 0.77   Calcium 9.5   Glucose 149*       Recent Labs  Lab 03/29/17  0232 03/28/17  1717   WBC 12.1* 11.9*   Hemoglobin 12.0 13.3   Hematocrit 38 41   Platelets 354 367      No results for input(s): HA1C in the last 168  hours.  No results for input(s): TSH, T3F, FT4 in the last 168 hours.     Radiology impressions (last 3 days):  No results found.      Currently Active/Followed Hospital Problems:  Active Hospital Problems    Diagnosis    *!*Labial abscess       Assessment:   Uncontrolled T2DM on insulin pump VGO-30 admitted with right labial abscess  on IV antibiotic  She took off her insulin pump last night and was started on basal bolus regimen of Lantus 30 units at bedtime and Humalog 10 units with correction scale of 1:40>140 mg/dl    Her BG has been stable on her current insulin regimen    Plan:    Continue on current insulin regimen   Check BG before each meal and at bedtime   Correct any hypoglycemia < 70 mg/dl as per protocol   On discharge patient was advised to start her pump back on 21 hours from the last Lantus dose and resume her usual regimen before admission   She has follow up scheduled at  Dr April Manson 's office on 04/09/17   Will continue to follow    Author: Cephus Shelling, MBBS  Note created: 03/29/2017  at: 12:19 PM   Plan was D/W Dr Bernita Raisin

## 2017-03-29 NOTE — Progress Notes (Signed)
GYNECOLOGY PROGRESS NOTE    LOS: 0 days     Subjective     Kaylee Harris reports feeling uncomfortable this morning. Pain is well-controlled with current regimen when right labia is not being palpated. Says that the pain is spreading up to her right groin and lower right abdomen. Her current diet is diabetic and she reports tolerating this, but has some nausea when pain gets severe. She has ambulated. Denies fevers, sweats, chills. Otherwise without concerns this morning.      Objective     Vitals:    03/28/17 2034 03/28/17 2212 03/29/17 0130 03/29/17 0514   BP: 132/80 144/86 110/61 107/67   BP Location: Left arm Left arm Right arm Left arm   Pulse: 72 76 60 65   Resp: '16 16 16 16   ' Temp: 36.6 C (97.9 F) 36.8 C (98.2 F) 36.5 C (97.7 F) 36.8 C (98.2 F)   TempSrc: Temporal Temporal Temporal Temporal   SpO2: 99% 96% 96% 96%   Weight:       Height:           General appearance: Alert, no acute distress  Lungs: Clear to auscultation bilaterally, normal respiratory effort  Heart: S1S2 RRR  Abdomen: soft, non-distended. Tender to palpation in right lower quadrant, no tenderness elsewhere. No overlying skin changes in RLQ.   GU: right labial edematous, erythematous, firm, tender to palpation. Left labia normal.   Extremities: No edema        Labs     Blood counts    Recent Labs  Lab 03/29/17  0232 03/28/17  1717   WBC 12.1* 11.9*   Seg Neut % 56.0 68.5   Neut # K/uL 6.8* 8.2*   Hemoglobin 12.0 13.3   Hematocrit 38 41   Platelets [phone]     Metabolic panel    Recent Labs  Lab 03/28/17  1717   Sodium 140   Potassium 4.1   Chloride 105   CO2 22   UN 10   Creatinine 0.77   GFR,Caucasian 95   Glucose 149*   Calcium 9.5      Hepatobiliary testing  No results for input(s): TP, ALB, ALT, AST, ALK, TB, DB in the last 168 hours. Coagulopathy testing  No results for input(s): PTI, PTT, INR in the last 168 hours.   Blood glucose    Recent Labs  Lab 03/29/17  0250 03/28/17  2200   Glucose POCT 122* 117*     Cardiac testing  No results  for input(s): TROP, CK, CKMB in the last 168 hours.   Tumor markers  No results found for: C1253  All recent labs reviewed.         Assessment & Plan     41 y.o. with history of type II diabetes and prior labial abscesses admitted with right labial abscess. Doing okay.     Right Labial Abscess  - Clindamycin IV q 8 hours, will transition to PO when able  - Afebrile  - No area to drain at this time  - Pain: tylenol, oxycodone   - Nausea: zofran    Type II Diabetes  - Insulin pump at home  [facility] regimen: Lantus 30 units QHS, 10 units w/ meals with 1:40 correction > 140 mg/dl.   - Diabetic diet  - Endocrinology will see patient today, appreciate their recommendations    Anxiety  - Home buspar ordered    Fibromyalgia  - home cymbalta, lyrica ordered  Hyperlipidemia  - home atorvastatin ordered    Bipolar Disorder  - home lamictal, mirtazapine ordered      - DVT prophylaxis: ambulation, SCDs      Dispo: pending clinical course    Ilda Foil, MD, MPH  PGY1 OB/GYN  Pager [phone]

## 2017-03-30 LAB — POCT GLUCOSE
Glucose POCT: 147 mg/dL — ABNORMAL HIGH (ref 60–99)
Glucose POCT: 176 mg/dL — ABNORMAL HIGH (ref 60–99)
Glucose POCT: 172 mg/dL — ABNORMAL HIGH (ref 60–99)
Glucose POCT: 165 mg/dL — ABNORMAL HIGH (ref 60–99)

## 2017-03-30 LAB — CBC
Hematocrit: 37 % (ref 34–45)
Hemoglobin: 11.8 g/dL (ref 11.2–15.7)
MCH: 28 pg (ref 26–32)
MCHC: 32 g/dL (ref 32–36)
MCV: 88 fL (ref 79–95)
Platelets: 319 10*3/uL (ref 160–370)
RBC: 4.3 MIL/uL (ref 3.9–5.2)
RDW: 13.4 % (ref 11.7–14.4)
WBC: 8.1 10*3/uL (ref 4.0–10.0)

## 2017-03-30 MED ORDER — VANCOMYCIN HCL IN DEXTROSE 5 MG/ML IV SOLN *I*
1000.0000 mg | Freq: Three times a day (TID) | INTRAVENOUS | Status: DC
Start: 2017-03-30 — End: 2017-04-01
  Administered 2017-03-30 – 2017-04-01 (×5): 1000 mg via INTRAVENOUS
  Filled 2017-03-30 (×10): qty 200

## 2017-03-30 MED ORDER — INSULIN GLARGINE 100 UNIT/ML SC SOLN *WRAPPED*
20.0000 [IU] | Freq: Every evening | SUBCUTANEOUS | Status: DC
Start: 2017-03-30 — End: 2017-04-01
  Administered 2017-03-30 – 2017-03-31 (×2): 20 [IU] via SUBCUTANEOUS

## 2017-03-30 MED ORDER — OXYCODONE HCL 5 MG PO TABS *I*
5.0000 mg | ORAL_TABLET | Freq: Once | ORAL | Status: AC
Start: 2017-03-31 — End: 2017-03-30
  Administered 2017-03-30: 5 mg via ORAL
  Filled 2017-03-30: qty 1

## 2017-03-30 MED ORDER — DOCUSATE SODIUM 100 MG PO CAPS *I*
100.0000 mg | ORAL_CAPSULE | Freq: Two times a day (BID) | ORAL | Status: DC | PRN
Start: 2017-03-30 — End: 2017-04-01

## 2017-03-30 MED ORDER — VANCOMYCIN HCL IN D5W 1750 MG/500 ML IV SOLN *I*
1750.0000 mg | Freq: Three times a day (TID) | INTRAVENOUS | Status: DC
Start: 2017-03-30 — End: 2017-03-30

## 2017-03-30 MED ORDER — CLONAZEPAM 0.5 MG PO TABS *I*
0.5000 mg | ORAL_TABLET | Freq: Every day | ORAL | Status: DC
Start: 2017-03-30 — End: 2017-04-01
  Administered 2017-03-30 – 2017-04-01 (×3): 0.5 mg via ORAL
  Filled 2017-03-30 (×3): qty 1

## 2017-03-30 MED ORDER — TOPIRAMATE 25 MG PO TABS *I*
100.0000 mg | ORAL_TABLET | Freq: Two times a day (BID) | ORAL | Status: DC | PRN
Start: 2017-03-30 — End: 2017-04-01
  Administered 2017-03-30 – 2017-04-01 (×3): 100 mg via ORAL
  Filled 2017-03-30: qty 1
  Filled 2017-03-30 (×3): qty 4

## 2017-03-30 NOTE — Progress Notes (Signed)
Pharmacist Pharmacokinetic Note - Vancomycin    General  Patient is receiving vancomycin for: SSTI    Current vancomycin regimen: 1000 mg every 8 hours    Today is day 1 of therapy.      Goal vancomycin trough concentration:  10-15 mcg/mL.    Laboratory Data      Lab results: 03/30/17  0900 03/29/17  0232 03/28/17  1717   WBC 8.1 12.1* 11.9*         Lab results: 03/28/17  1717   Creatinine 0.77         Lab results: 03/28/17  1717   UN 10           Vancomycin concentrations:    No results for input(s): VANCT, VANC in the last 720 hours.       Assessment  Reported vancomycin concentration is: none at this time.    Recommendation  Based on calculations, patient's renal function, volume status, and reported concentrations, recommend the following dose: 1000 mg IV every 8 hours.     The next vancomycin trough concentration has been ordered to be drawn on: 11/26.    Discussed plan with provider.   For questions contact pharmacy at [facility].      Merlene Pulling, PharmD

## 2017-03-30 NOTE — Progress Notes (Signed)
Pt moved into a new room today, pt requested medication for anxiety due to the move. Pt is clostrophobic and is now on the side of the room without windows and that seems smaller than her previous room. No orders for anti-anxiety meds at this time. Provider notified

## 2017-03-30 NOTE — Plan of Care (Signed)
Patient chart was reviewed:  She is planned for I&D of her right labial abscess in the OR tomorrow  She will be NPO after midnight      Recent Labs  Lab 03/30/17  2146 03/30/17  1737 03/30/17  1208 03/30/17  0851 03/29/17  2217 03/29/17  1723 03/29/17  1237 03/29/17  0833   Glucose POCT 165* 172* 176* 147* 181* 145* 158* 152*       Assessment:   Uncontrolled T2DM on insulin pump VGO-30 admitted with right labial abscess on IV antibiotic  She took off her insulin pump last night and was started on basal bolus regimen of Lantus 30 units at bedtime and Humalog 10 units with correction scale of 1:40>140 mg/dl    Her BG has been stable on her current insulin regimen  She is planned for I&D tomorrow      Plan:    Continue on current insulin regimen   Please hold Humalog dose in AM as patient will be NPO    Check BG before each meal and at bedtime   Correct any hypoglycemia < 70 mg/dl as per protocol   Endocrinology team at [facility] will follow tomorrow    Cephus Shelling  Endocrinology Fellow    Plan was D/W Dr Bernita Raisin

## 2017-03-30 NOTE — Progress Notes (Signed)
03/30/17 1400   UM Patient Class Review   Patient Class Review Inpatient

## 2017-03-30 NOTE — Progress Notes (Signed)
[facility] Day 3    Subjective     Kaylee Harris reports feeling worse this morning. Pain is reduced with current regimen. Feels that the pain continues to spread up to her right groin and lower right abdomen, and today feels it is now spreading on her thigh. Her current diet is diabetic and she reports tolerating this as long as she continues zofran. She has ambulated. Denies fevers.       Objective     Vitals:    03/29/17 1719 03/29/17 2145 03/30/17 0158 03/30/17 0531   BP: 132/74 114/78 102/62 132/74   BP Location: Left arm Left arm Left arm Left arm   Pulse: 72 70 57 59   Resp: '18 18 18 18   '$ Temp: 36.6 C (97.9 F) 36.2 C (97.2 F) 35.8 C (96.4 F) 35.7 C (96.3 F)   TempSrc: Temporal Temporal Temporal Temporal   SpO2: 97% 97% 95% 92%   Weight:       Height:           General appearance: Alert, mild distress due to pain  Lungs: Clear to auscultation bilaterally, normal respiratory effort  Heart: S1S2 RRR  Abdomen: soft, non-distended. Tender to palpation in right lower quadrant, no tenderness elsewhere. No overlying skin changes in RLQ.   GU: right labial with increased edema compared to yesterday. Erythematous, tender to palpation. Fluctuant mass palpated in upper right labia majora. Left labia normal.   Extremities: Increased warmth on right inner thigh. No visible skin changes.        Labs     Blood counts    Recent Labs  Lab 03/29/17  0232 03/28/17  1717   WBC 12.1* 11.9*   Seg Neut % 56.0 68.5   Neut # K/uL 6.8* 8.2*   Hemoglobin 12.0 13.3   Hematocrit 38 41   Platelets 332 951     Metabolic panel    Recent Labs  Lab 03/28/17  1717   Sodium 140   Potassium 4.1   Chloride 105   CO2 22   UN 10   Creatinine 0.77   GFR,Caucasian 95   Glucose 149*   Calcium 9.5      Hepatobiliary testing  No results for input(s): TP, ALB, ALT, AST, ALK, TB, DB in the last 168 hours. Coagulopathy testing  No results for input(s): PTI, PTT, INR in the last 168 hours.   Blood glucose    Recent Labs  Lab  03/29/17  2217 03/29/17  1723 03/29/17  1237 03/29/17  0833   Glucose POCT 181* 145* 158* 152*     Cardiac testing  No results for input(s): TROP, CK, CKMB in the last 168 hours.   Tumor markers  No results found for: C1253  All recent labs reviewed.         Assessment & Plan     41 y.o. with history of type II diabetes and prior labial abscesses admitted with right labial abscess. Worsening edema this morning with more of a discrete mass palpated.     Right Labial Abscess  - Clindamycin IV q 8 hours, will transition to PO when able  - Afebrile  - Will consider ultrasound of abscess today to determine if mass is able to be drained.   - Pain: tylenol, motrin, oxycodone   - Nausea: zofran    Type II Diabetes  - Insulin pump at home  [facility] regimen per endocrine : Lantus 30 units QHS,  10 units w/ meals with 1:40 correction > 140 mg/dl. Appreciate Endocrinology  involvement.   - Diabetic diet     Anxiety  - Home buspar ordered    Fibromyalgia  - home cymbalta, lyrica ordered    Hyperlipidemia  - home atorvastatin ordered    Bipolar Disorder  - home lamictal, mirtazapine ordered      - DVT prophylaxis: ambulation, SCDs      Dispo: pending clinical course    Ilda Foil, MD, MPH  PGY1 OB/GYN  Pager [phone]

## 2017-03-30 NOTE — Progress Notes (Signed)
Since patient had meal at 1030 am, will schedule procedure for the AM.  I discussed her with Dr. Higinio Roger and Dr. Loa Socks from anesthesiology.    Ian Bushman, MD

## 2017-03-31 ENCOUNTER — Encounter
Admission: EM | Disposition: A | Discharge status: Home Health | Payer: Self-pay | Source: Ambulatory Visit | Attending: Obstetrics and Gynecology

## 2017-03-31 ENCOUNTER — Inpatient Hospital Stay: Payer: Medicare (Managed Care) | Admitting: Anesthesiology

## 2017-03-31 DIAGNOSIS — N764 Abscess of vulva: Secondary | ICD-10-CM

## 2017-03-31 DIAGNOSIS — L03315 Cellulitis of perineum: Secondary | ICD-10-CM

## 2017-03-31 LAB — POCT GLUCOSE
Glucose POCT: 145 mg/dL — ABNORMAL HIGH (ref 60–99)
Glucose POCT: 151 mg/dL — ABNORMAL HIGH (ref 60–99)
Glucose POCT: 146 mg/dL — ABNORMAL HIGH (ref 60–99)
Glucose POCT: 154 mg/dL — ABNORMAL HIGH (ref 60–99)
Glucose POCT: 159 mg/dL — ABNORMAL HIGH (ref 60–99)
Glucose POCT: 202 mg/dL — ABNORMAL HIGH (ref 60–99)
Glucose POCT: 302 mg/dL — ABNORMAL HIGH (ref 60–99)

## 2017-03-31 LAB — VANCOMYCIN, TROUGH: Vancomycin Trough: 10.3 ug/mL (ref 10.0–20.0)

## 2017-03-31 LAB — POCT URINE PREGNANCY: Lot #: 7110176

## 2017-03-31 SURGERY — VAGINOSCOPY
Anesthesia: General | Site: Vagina | Wound class: Dirty or Infected

## 2017-03-31 MED ORDER — HALOPERIDOL LACTATE 5 MG/ML IJ SOLN *I*
0.5000 mg | Freq: Once | INTRAMUSCULAR | Status: DC | PRN
Start: 2017-03-31 — End: 2017-03-31

## 2017-03-31 MED ORDER — SODIUM CHLORIDE 0.9 % IV SOLN WRAPPED *I*
125.0000 mL/h | Status: DC
Start: 2017-03-31 — End: 2017-04-01
  Administered 2017-03-31 (×3): 125 mL/h via INTRAVENOUS

## 2017-03-31 MED ORDER — LACTATED RINGERS IV SOLN *I*
125.0000 mL/h | INTRAVENOUS | Status: DC
Start: 2017-03-31 — End: 2017-03-31
  Administered 2017-03-31: 125 mL/h via INTRAVENOUS

## 2017-03-31 MED ORDER — LIDOCAINE HCL 2 % IJ SOLN *I*
INTRAMUSCULAR | Status: DC | PRN
Start: 2017-03-31 — End: 2017-03-31
  Administered 2017-03-31: 100 mg via INTRAVENOUS

## 2017-03-31 MED ORDER — LIDOCAINE HCL (PF) 1 % IJ SOLN *I*
INTRAMUSCULAR | Status: AC
Start: 2017-03-31 — End: 2017-03-31
  Filled 2017-03-31: qty 30

## 2017-03-31 MED ORDER — ONDANSETRON HCL 2 MG/ML IV SOLN *I*
INTRAMUSCULAR | Status: DC | PRN
Start: 2017-03-31 — End: 2017-03-31
  Administered 2017-03-31: 4 mg via INTRAVENOUS

## 2017-03-31 MED ORDER — LACTATED RINGERS IV SOLN *I*
125.0000 mL/h | INTRAVENOUS | Status: DC
Start: 2017-03-31 — End: 2017-03-31

## 2017-03-31 MED ORDER — PROPOFOL 10 MG/ML IV EMUL (INTERMITTENT DOSING) WRAPPED *I*
INTRAVENOUS | Status: DC | PRN
Start: 2017-03-31 — End: 2017-03-31
  Administered 2017-03-31: 50 mg via INTRAVENOUS
  Administered 2017-03-31: 150 mg via INTRAVENOUS

## 2017-03-31 MED ORDER — HYDROMORPHONE HCL PF 0.5 MG/0.5 ML IJ SOLN *I*
0.5000 mg | Freq: Once | INTRAMUSCULAR | Status: DC
Start: 2017-03-31 — End: 2017-03-31
  Filled 2017-03-31: qty 0.5

## 2017-03-31 MED ORDER — LIDOCAINE HCL (PF) 1 % IJ SOLN *I*
0.1000 mL | INTRAMUSCULAR | Status: DC | PRN
Start: 2017-03-31 — End: 2017-03-31

## 2017-03-31 MED ORDER — FENTANYL CITRATE 50 MCG/ML IJ SOLN *WRAPPED*
INTRAMUSCULAR | Status: AC
Start: 2017-03-31 — End: 2017-03-31
  Filled 2017-03-31: qty 2

## 2017-03-31 MED ORDER — HYDROMORPHONE HCL PF 0.5 MG/0.5 ML IJ SOLN *I*
0.5000 mg | INTRAMUSCULAR | Status: DC | PRN
Start: 2017-03-31 — End: 2017-03-31
  Administered 2017-03-31 (×2): 0.5 mg via INTRAVENOUS
  Filled 2017-03-31: qty 0.5

## 2017-03-31 MED ORDER — MIDAZOLAM HCL 1 MG/ML IJ SOLN *I* WRAPPED
INTRAMUSCULAR | Status: AC
Start: 2017-03-31 — End: 2017-03-31
  Filled 2017-03-31: qty 2

## 2017-03-31 MED ORDER — PROMETHAZINE HCL 25 MG/ML IJ SOLN *I*
25.0000 mg | Freq: Four times a day (QID) | INTRAMUSCULAR | Status: DC | PRN
Start: 2017-03-31 — End: 2017-04-01
  Administered 2017-03-31: 25 mg via INTRAVENOUS
  Filled 2017-03-31: qty 1

## 2017-03-31 MED ORDER — PROMETHAZINE HCL 25 MG/ML IJ SOLN *I*
6.2500 mg | Freq: Once | INTRAMUSCULAR | Status: DC | PRN
Start: 2017-03-31 — End: 2017-03-31

## 2017-03-31 MED ORDER — ONDANSETRON HCL 2 MG/ML IV SOLN *I*
4.0000 mg | Freq: Four times a day (QID) | INTRAMUSCULAR | Status: DC | PRN
Start: 2017-03-31 — End: 2017-04-01
  Administered 2017-03-31 – 2017-04-01 (×4): 4 mg via INTRAVENOUS
  Filled 2017-03-31 (×4): qty 2

## 2017-03-31 MED ORDER — VANCOMYCIN HCL IN DEXTROSE 5 MG/ML IV SOLN *I*
INTRAVENOUS | Status: DC
Start: 2017-03-31 — End: 2017-03-31
  Filled 2017-03-31: qty 200

## 2017-03-31 MED ORDER — VANCOMYCIN HCL 1000 MG IV SOLR WRAPPED *I*
INTRAVENOUS | Status: DC | PRN
  Administered 2017-03-31: 1000 mg via INTRAVENOUS

## 2017-03-31 MED ORDER — LACTATED RINGERS IV SOLN *I*
20.0000 mL/h | INTRAVENOUS | Status: DC
Start: 2017-03-31 — End: 2017-03-31

## 2017-03-31 MED ORDER — MIDAZOLAM HCL 1 MG/ML IJ SOLN *I* WRAPPED
INTRAMUSCULAR | Status: DC | PRN
Start: 2017-03-31 — End: 2017-03-31
  Administered 2017-03-31: 2 mg via INTRAVENOUS

## 2017-03-31 MED ORDER — FENTANYL CITRATE 50 MCG/ML IJ SOLN *WRAPPED*
INTRAMUSCULAR | Status: DC | PRN
Start: 2017-03-31 — End: 2017-03-31
  Administered 2017-03-31: 50 ug via INTRAVENOUS

## 2017-03-31 MED ORDER — EPHEDRINE 5MG/ML IN NS IV/IJ *WRAPPED*
INTRAMUSCULAR | Status: DC | PRN
Start: 2017-03-31 — End: 2017-03-31
  Administered 2017-03-31: 10 mg via INTRAVENOUS

## 2017-03-31 MED ORDER — CLINDAMYCIN PHOSPHATE IN D5W 900 MG/50ML IV SOLN *I*
INTRAVENOUS | Status: DC
Start: 2017-03-31 — End: 2017-03-31
  Filled 2017-03-31: qty 50

## 2017-03-31 MED ORDER — ONDANSETRON HCL 2 MG/ML IV SOLN *I*
4.0000 mg | Freq: Once | INTRAMUSCULAR | Status: DC | PRN
Start: 2017-03-31 — End: 2017-03-31

## 2017-03-31 MED ORDER — OXYCODONE HCL 5 MG PO TABS *I*
5.0000 mg | ORAL_TABLET | ORAL | Status: DC | PRN
Start: 2017-03-31 — End: 2017-04-01
  Administered 2017-03-31 – 2017-04-01 (×8): 5 mg via ORAL
  Filled 2017-03-31 (×6): qty 1

## 2017-03-31 MED ORDER — DEXAMETHASONE SODIUM PHOSPHATE 4 MG/ML INJ SOLN *WRAPPED*
INTRAMUSCULAR | Status: DC | PRN
Start: 2017-03-31 — End: 2017-03-31
  Administered 2017-03-31: 4 mg via INTRAVENOUS

## 2017-03-31 SURGICAL SUPPLY — 27 items
BLADE SURG CARBON STEEL #15 STER (Supply) ×3 IMPLANT
BLADE SURG CARBON STEEL #15 STER REUSE HNDL (Supply) ×2 IMPLANT
BLANKET UPPER BODY TEMP THERAPY (Drape) ×3 IMPLANT
COVER LIGHT HANDLE SURG FLEX (Other) ×3 IMPLANT
DRAPE UNDERBUTTOCKS W/POUCH BACKING STER (Drape) ×3 IMPLANT
FILTER NEPTUNE 4PORT MANIFOLD (Supply) ×3 IMPLANT
GLOVE LINER BIOGEL INDICATOR SZ6.5 LTX (Glove) ×3 IMPLANT
GLOVE SURG BIOGEL SZ6.5 LTX STER PF (Glove) ×6 IMPLANT
GLOVE SURG PROTEXIS PI 7.0 PF SYN (Glove) ×24 IMPLANT
LUBRICANT JELLY 2.7GM STER (Supply) IMPLANT
NEEDLE HYPO BVL LF 25G X 1.5IN (Needle) IMPLANT
PACK CUSTOM MINOR VAGINAL PACK (Pack) ×3 IMPLANT
PENCIL ROCKER SWITCH W/HOLSTER SS DISP (Supply) IMPLANT
SLEEVE COMP KNEE HI MED (Supply) ×3 IMPLANT
SOL CHG SCRUB 4 OZ (Other) ×1
SOL PVP TOPICAL PREP 4OZ (Other) ×3 IMPLANT
SOL SODIUM CHLORIDE IRRIG 1000ML BTL (Solution) ×3 IMPLANT
SOL WATER IRRIG STERILE 1000ML BTL (Solution) IMPLANT
SOLUTION SUR PREP 4OZ 4% CHG BTL EXIDINE (Other) ×2 IMPLANT
SPIKE MINI DISPENSING PIN (BBRAUN DP1000) (Supply) ×3 IMPLANT
SUTR VICRYL ANTIB 3-0 SH 18 VIOLET (Suture) ×3 IMPLANT
SYRINGE LUERLOCK 30ML INDIVIDUAL WRAP (Supply) ×3 IMPLANT
SYRINGE LUERLOCK CNTL 10CC (Supply) IMPLANT
TIP YANKAUER SUCT BULBOUS ON/OFF LF (Supply) ×3 IMPLANT
TRAY PATIENT PREP (Tray) ×1
TRAY PREP SKIN INCL 8 DRY GZ PD TWO 6IN COT TIP APPL 2 STK SPNG (Tray) ×2 IMPLANT
TUBING NONCONDUC CONN 12FT X 3/16IN (Tubing) ×3 IMPLANT

## 2017-03-31 NOTE — Progress Notes (Signed)
GYNECOLOGY INPATIENT PROGRESS NOTE       LOS: 1 day       Subjective     Reporting 6/10 vulvar pain this morning. Endorses chills/diaphoresis at times, no fevers. No N/V. NPO for possible OR case today.       Objective     Maternal vital signs  Vitals:    03/30/17 2128 03/31/17 0036 03/31/17 0116 03/31/17 0516   BP: 123/90  121/77 139/60   BP Location: Right arm  Right arm Right arm   Pulse: 71  67 60   Resp: 18 17 18 18    Temp: 35.9 C (96.7 F)  36.3 C (97.3 F) 36.4 C (97.5 F)   TempSrc: Oral  Temporal Temporal   SpO2: 96%  95% 99%   Weight:       Height:           Physical exam  General: NAD, well-appearing  Mental Status: Alert and oriented x 3  HEENT: Normocephalic, atraumatic  Cardiovascular: Regular rate and rhythm with no murmurs  Respiratory: Clear to auscultate and Unlabored respiratory effort on room air  Abdomen: soft, nontender  Vulva: firm induration of right labia near mons, soft edema of right labia without fluctuant mass, tender to palpation, no erythema. Mild edema and induration without erythema of right mons and groin.       Laboratory Evaluation   Ref. Range 03/30/2017 09:00   WBC Latest Ref Range: 4.0 - 10.0 THOU/uL 8.1   RBC Latest Ref Range: 3.9 - 5.2 MIL/uL 4.3   Hemoglobin Latest Ref Range: 11.2 - 15.7 g/dL 11.8   Hematocrit Latest Ref Range: 34 - 45 % 37   MCV Latest Ref Range: 79 - 95 fL 88   MCH Latest Ref Range: 26 - 32 pg 28   MCHC Latest Ref Range: 32 - 36 g/dL 32   RDW Latest Ref Range: 11.7 - 14.4 % 13.4   Platelets Latest Ref Range: 160 - 370 THOU/uL 319         {  Recent Labs  Lab 03/31/17  0347 03/30/17  2146 03/30/17  1737 03/30/17  1208 03/30/17  0851 03/29/17  2217 03/29/17  1723 03/29/17  1237   Glucose POCT 145* 165* 172* 176* 147* 181* 145* 158*           Assessment & Plan     Kaylee Harris is a 41 y.o. F with T2DM on insulin pump (turned off overnight) and right vulvar cellulitis without fluctuant mass admitted for IV antibiotics, possible I&D.     Right vulvar  cellulitis  - IV clindamycin (11/24 - ) and IV vancomycin (11/25 - )  - afebrile, no leukocytosis  - continue ibuprofen, tylenol, oxycodone PRN for pain.   - no fluctuant collection identified on my exam this morning, will re-examine with attending and then decide on plan for OR I&D versus continued antibiotics    T2DM  - insulin pump discontinued overnight  - endocrine following, appreciate recommendations  - hospital regimen:Lantus 30 units QHS, 10 units w/ meals with 1:40 correction > 140 mg/dl.   - hold lispro while NPO    Anxiety  - continue home buspar    Fibromyalgia  - continue home cymbalta, lyrica    Hyperlipidemia  - continue home atorvastatin    Bipolar Disorder  - home lamictal, mirtazapine ordered    DVT prophylaxis: ambulation, SCDs    Dispo: pending decision for OR today    Jennette Banker,  MD  Obstetrics and Gynecology R4  Pager [phone]

## 2017-03-31 NOTE — Anesthesia Procedure Notes (Signed)
---------------------------------------------------------------------------------------------------------------------------------------    AIRWAY   GENERAL INFORMATION AND STAFF    Patient location during procedure: OR       Date of Procedure: 03/31/2017 2:51 PM  CONDITION PRIOR TO MANIPULATION     Current Airway/Neck Condition:  Normal        For more airway physical exam details, see Anesthesia PreOp Evaluation  AIRWAY METHOD     Patient Position:  Sniffing    Preoxygenated: yes      Induction: IV  Mask Difficulty Assessment:  0 - not attempted    Number of Attempts at Approach:  1  FINAL AIRWAY DETAILS    Final Airway Type:  LMA    Final LMA: Unique    LMA Size: 4  ----------------------------------------------------------------------------------------------------------------------------------------

## 2017-03-31 NOTE — Anesthesia Case Conclusion (Signed)
CASE CONCLUSION  Emergence  Actions:  LMA removed  Criteria Used for Airway Removal:  Adequate Tv & RR, acceptable O2 saturation and following commands  Assessment:  Routine  Transport  Directly to: PACU  Airway:  Facemask  Oxygen Delivery:  10 lpm  Monitoring:  Pulse oximetry  Position:  Recumbent  Patient Condition on Handoff  Level of Consciousness:  Alert/talking/calm  Patient Condition:  Stable  Handoff Report to:  RN

## 2017-03-31 NOTE — Progress Notes (Signed)
Report to [place] on E5

## 2017-03-31 NOTE — Op Note (Signed)
Operative Note (Surgical Log ID: [id number])     DATE OF SURGERY: 03/31/2017  SURGEON: Chipper Oman, MD   ASSISTANT: Jennette Banker, MD    PREOPERATIVE DIAGNOSES:  1. Concern for right labial abscess    POSTOPERATIVE DIAGNOSES:  1. Labial cellulitis    PROCEDURE:  1. Exam under anesthesia     ANESTHESIA:  General    FINDINGS:  On EUA, edematous and indurated right labia major, with overlying erythema. Firm area of induration on right labia towards mons. Through examination of right labia majora demonstrated no area of fluctuance suggesting abscess.    ESTIMATED BLOOD LOSS: 0 mL    FLUIDS: See anesthesia record    URINE OUTPUT: N/A    SPECIMENS: N/A    DRAINS, PACKS, FOREIGN OBJECTS RETAINED: N/A    COMPLICATIONS: None    CONDITION: good    DISPOSITION: PACU -> Floor    INDICATION: Kaylee Harris is an 41 y.o. who presented on 11/23 with right labial abscess. Past medical history significant for poorly controlled T2DM.  Treated with IV antibiotics, with appropriate response to therapy. Due to limitations with bedside exam and concern for possible palpable abscess, decision was made to take the patient to the OR for an EUA, and possible I&D.    NARRATIVE:   The patient was taken to the operating room with an IV in place. The safety checklist was performed, confirming the patient and the procedure to be completed.  The patient was placed under general anesthesia, and tolerated this well. The patient was then prepped and draped in the normal sterile fashion in the dorsal lithotomy position in stirrups. The surgical pause was completed.     Exam under anesthesia demonstrated findings as noted above: erythematous and edematous right labia, with area of firm induration, but no discreet palpable collection which could be drained.  Given those findings, no I&D was performed. This concluded the surgery.    Sponge, lap, needle, and instrument counts were correct. The patient tolerated the procedure well, anesthesia was reversed,  and patient was taken to the recovery room in a stable condition.    Signed: Chipper Oman, MD on 03/31/2017 at 3:34 PM

## 2017-03-31 NOTE — Progress Notes (Signed)
OB/GYN Attending Post-Operative Family Discussion Note    At Delona's request, I spoke with her wife and mother in the main lobby.    I informed them that the EUA surgical procedure was completed.  Reviewed that there were no complications with the procedure.  We discussed that on EUA, no discreet collection that would suggest an abscess was identified, and that her clinical exam findings are more consistent with a cellulitis, and that no I&D was required or performed.   We discussed that IV antibiotics are the appropriate treatment here, and plan to transition to PO antibiotics tomorrow as long as patient continues to remain afebrile.  We discussed that it will take time for the edema and induration to improve, and symptomatic management is the goal (including OTC analgesics, warm compresses, sitz baths).  We discussed long-term glycemic control as a way to help present future occurrences.   They expressed understanding of our discussion.    Chipper Oman, MD 03/31/2017 3:29 PM

## 2017-03-31 NOTE — Preop H&P (Signed)
UPDATES TO PATIENT'S CONDITION on the DAY OF SURGERY/PROCEDURE    I. Updates to Patient's Condition (to be completed by a provider privileged to complete a H&P, following reassessment of the patient by the provider):    Day of Surgery/Procedure Update:  History  (Inpatients only): I confirm that progress notes within the past 24 hours document updates to the patient's condition.    Physical  (Inpatients only): I confirm that progress notes within the past 24 hours document updates to the patient's condition.    II. Procedure Readiness   I have reviewed the patient's H&P and updated condition. By completing and signing this form, I attest that this patient is ready for surgery/procedure.    III. Attestation   I have reviewed the updated information regarding the patient's condition and it is appropriate to proceed with the planned surgery/procedure.    Chipper Oman, MD as of 2:02 PM 03/31/2017

## 2017-03-31 NOTE — Anesthesia Preprocedure Evaluation (Signed)
Anesthesia Pre-operative History and Physical for Kaylee Harris  .  Marland Kitchen  Anesthesia Evaluation Information Source: patient, family, records     ANESTHESIA HISTORY     Denies anesthesia history    GENERAL    + Obesity (morbid obesity)     PULMONARY    + Asthma  Pertinent(-):  No smoking    CARDIOVASCULAR  Good(4+METs) Exercise Tolerance    + Hypertension    GI/HEPATIC/RENAL  Last PO Intake: >8hr before procedure    + GERD          well controlled    + Liver Disease (fatty liver) NEURO/PSYCH    + Headaches    + Psychiatric Issues    ENDO/OTHER    + Diabetes Mellitus          Type 2 using insulin    HEMATOLOGIC    + Arthritis       Physical Exam    Airway            Mallampati: III            TM distance (fb): >3 FB            Neck ROM: full            Airway Impression: easy  Dental   Normal Exam   Cardiovascular  Normal Exam           Rhythm: regular           Rate: normal        General Survey    Normal Exam   Pulmonary   Normal Exam    breath sounds clear to auscultation    Mental Status   Normal Exam         ________________________________________________________________________  PLAN  ASA Score  3  Anesthetic Plan general     Induction (routine IV) General Anesthesia/Sedation Maintenance Plan (inhaled agents); Airway (LMA); Line ( use current access); Monitoring (standard ASA); Positioning (lithotomy); PONV Plan (ondansetron); Pain (per surgical team); PostOp (PACU)    Informed Consent     Risks:          Risks discussed were commensurate with the plan listed above with the following specific points: N/V and sore throat , damage to:(eyes, teeth), unexpected serious injury    Anesthetic Consent:         Anesthetic plan (and risks as noted above) were discussed with patient    Blood products Consent:        Use of blood products discussed with:and they consented    Plan also discussed with team members including:       resident    Attending Attestation:  As the primary attending anesthesiologist, I attest that the  patient or proxy understands and accepts the risks and benefits of the anesthesia plan. I also attest that I have personally performed a pre-anesthetic examination and evaluation, and prescribed the anesthetic plan for this particular location within 48 hours prior to the anesthetic as documented. Boyce Medici, DO 2:31 PM

## 2017-04-01 DIAGNOSIS — E1165 Type 2 diabetes mellitus with hyperglycemia: Secondary | ICD-10-CM

## 2017-04-01 LAB — POCT GLUCOSE
Glucose POCT: 229 mg/dL — ABNORMAL HIGH (ref 60–99)
Glucose POCT: 192 mg/dL — ABNORMAL HIGH (ref 60–99)
Glucose POCT: 211 mg/dL — ABNORMAL HIGH (ref 60–99)

## 2017-04-01 MED ORDER — SULFAMETHOXAZOLE-TRIMETHOPRIM 800-160 MG PO TABS *I*
1.0000 | ORAL_TABLET | Freq: Two times a day (BID) | ORAL | 0 refills | Status: DC
Start: 2017-04-01 — End: 2017-04-01

## 2017-04-01 MED ORDER — SULFAMETHOXAZOLE-TRIMETHOPRIM 800-160 MG PO TABS *I*
1.0000 | ORAL_TABLET | Freq: Two times a day (BID) | ORAL | 0 refills | Status: DC
Start: 2017-04-01 — End: 2017-04-17

## 2017-04-01 MED ORDER — PROMETHAZINE HCL 25 MG PO TABS *I*
25.0000 mg | ORAL_TABLET | ORAL | 0 refills | Status: DC | PRN
Start: 2017-04-01 — End: 2017-06-26

## 2017-04-01 MED ORDER — IBUPROFEN 600 MG PO TABS *I*
600.0000 mg | ORAL_TABLET | Freq: Three times a day (TID) | ORAL | 0 refills | Status: DC | PRN
Start: 2017-04-01 — End: 2017-04-23

## 2017-04-01 MED ORDER — PROMETHAZINE HCL 25 MG PO TABS *I*
25.0000 mg | ORAL_TABLET | ORAL | 0 refills | Status: DC | PRN
Start: 2017-04-01 — End: 2017-04-01

## 2017-04-01 MED ORDER — OXYCODONE HCL 5 MG PO CAPS *A*
5.0000 mg | ORAL_CAPSULE | ORAL | 0 refills | Status: DC | PRN
Start: 2017-04-01 — End: 2017-04-17

## 2017-04-01 MED ORDER — OXYCODONE HCL 5 MG PO CAPS *A*
5.0000 mg | ORAL_CAPSULE | ORAL | 0 refills | Status: DC | PRN
Start: 2017-04-01 — End: 2017-04-01

## 2017-04-01 MED ORDER — SULFAMETHOXAZOLE-TRIMETHOPRIM 800-160 MG PO TABS *I*
1.0000 | ORAL_TABLET | Freq: Two times a day (BID) | ORAL | Status: DC
Start: 2017-04-01 — End: 2017-04-01
  Administered 2017-04-01: 1 via ORAL
  Filled 2017-04-01: qty 1

## 2017-04-01 MED ORDER — CLINDAMYCIN HCL 150 MG PO CAPS *I*
300.0000 mg | ORAL_CAPSULE | Freq: Four times a day (QID) | ORAL | Status: DC
Start: 2017-04-01 — End: 2017-04-01

## 2017-04-01 NOTE — Discharge Instructions (Signed)
[facility]   Discharge Instructions    You were admitted to the hospital for cellulitis of the vulva. Your condition has been determined to be stable and you are now being discharged.    Activity: you can resume normal activity    Special instructions:  1. Apply warm compresses to vulva four times daily  2. Take warm/hot tub baths three times daily  3. Continue antibiotic until all pills are gone  4. Take 600 mg motrin every 6 hours for pain around the clock. Take 3 pills (975 mg) of tylenol every 6 hours for pain around the clock. You should stagger these pain medications so you are taking a pain medication every 3 hours. Set an alarm on your phone to remind you of the times.     Follow-up plan:   You should be seen by your primary Ob/Gyn within the next 72h    Call your primary Ob/Gyn if you experience any of the following:  1. Fever  2. Worsening pain or redness of the vulva  3. Nausea/vomiting and cant eat or drink or take antibiotic pill  4. Dizzy/lightheaded    If you cannot reach your primary Ob/Gyn, come to [facility] Emergency Department.   Call 911 if it is an emergency.    Patient education regarding approaches to personal and hand hygiene  ? Practice frequent hand hygiene with soap and water and/or alcohol-based hand  gels, especially after touching infected skin or wound bandages.  ? Cover draining wounds with clean, dry bandages.  ? Do not share personal items (e.g. razors, used towels or clothing before  washing).  ? Regular bathing.  ? Avoid shaving.  ? Launder clothing, sheets, towels in hot water.  ? Clean all personal sporting clothing/equipment.    Decontamination of the environment  ? Clean high-touch areas in the bathroom with a disinfectant active against S.  aureus daily (e.g. Clorox bleach wipes)

## 2017-04-01 NOTE — Progress Notes (Signed)
Pt tolerating PO. Started PO antibiotics. Pt doing sitz bath TID as ordered and heat applied to affected area. Covered pt's BG for lunch and pt to restart insulin pump at home as discussed with endocrine NP.   Discharge instructions reviewed with pt and spouse. Pt stated understanding. Pt left floor via wheelchair with belongings.

## 2017-04-01 NOTE — Progress Notes (Signed)
Inpatient Endocrinology Follow Up Note.    Initial Consult Question: insulin management     Interval History:  Patient s/p right labial abscess I&D. Blood glucoses elevated overnight due to stress insulin resistance and insulin insufficiency. Currently running 192, received Lantus 20 units last night and Humalog 10 units + correction with meals.     Patient does not have V-Go 30 insulin pump at hospital to restart. She will restart once home this afternoon.     Patient reports hyperglycemia/poor blood glucose control due to poor nutrition at home. She has difficulty avoiding sweets and breads and is working with [facility] (CDE/RD) on plan for better BG control.       Active Hospital Medications:  Current Facility-Administered Medications   Medication Dose Route Frequency    ondansetron (ZOFRAN) injection 4 mg  4 mg Intravenous Q6H PRN    oxyCODONE (ROXICODONE) IR tablet 5 mg  5 mg Oral Q4H PRN    promethazine (PHENERGAN) injection 25 mg  25 mg Intravenous Q6H PRN    topiramate (TOPAMAX) tablet 100 mg  100 mg Oral Q12H PRN    docusate sodium (COLACE) capsule 100 mg  100 mg Oral BID PRN    insulin glargine (LANTUS) injection 20 Units  20 Units Subcutaneous Nightly    vancomycin (VANCOCIN) IV 1,000 mg  1,000 mg Intravenous Q8H    clonazePAM (KlonoPIN) tablet 0.5 mg  0.5 mg Oral Daily    clindamycin (CLEOCIN) IVPB 900 mg  900 mg Intravenous Q8H    busPIRone (BUSPAR) tablet 30 mg  30 mg Oral BID    fluticasone (FLONASE) 50 MCG/ACT nasal spray 1 spray  1 spray Each Nare Daily    ibuprofen (ADVIL,MOTRIN) tablet 600 mg  600 mg Oral TID    atorvastatin (LIPITOR) tablet 40 mg  40 mg Oral Daily with dinner    metoprolol (TOPROL-XL) 24 hr tablet 25 mg  25 mg Oral Daily    acetaminophen (TYLENOL) tablet 650 mg  650 mg Oral Q4H PRN    albuterol HFA (PROVENTIL, VENTOLIN, PROAIR HFA) inhaler 1-2 puff  1-2 puff Inhalation Q6H PRN    cetirizine (ZyrTEC) tablet 10 mg  10 mg Oral Daily    DULoxetine  (CYMBALTA) DR capsule 20 mg  20 mg Oral Daily    lamoTRIgine (LaMICtal) tablet 150 mg  150 mg Oral Daily    midodrine (PROAMATINE) tablet 10 mg  10 mg Oral TID    mirtazapine (REMERON) tablet 15 mg  15 mg Oral Nightly    pantoprazole (PROTONIX) EC tablet 40 mg  40 mg Oral Daily    pregabalin (LYRICA) capsule 150 mg  150 mg Oral 2 times per day    insulin lispro (HumaLOG,ADMELOG) injection 0-30 Units  0-30 Units Subcutaneous TID WC    dextrose (GLUTOSE) 40 % oral gel 15 g  15 g Oral PRN    dextrose 50% (0.5 g/mL) injection 25 g  25 g Intravenous PRN    glucagon (GLUCAGEN) injection 1 mg  1 mg Intramuscular PRN       Physical Examination:   CONSTITUTIONAL:   Vitals:    04/01/17 0945   BP: 114/80   Pulse: 96   Resp: 16   Temp: 35.9 C (96.6 F)   Weight:    Height:    She is obese, no acute distress  HEENT: PERLA, EOMI, no lid lag, pink conjunctiva, no proptosis.  NECK: supple, no thyromegaly, no lymphadenopathy  NEUROLOGICAL: Alert and oriented x 3.  ENDOCRINE: No supraclavicular fat pads, facial plethora, moon facies.     Assessment: This is a 41 y.o. with uncontrolled type 2 diabetes on insulin pump VGO-30 admitted with right vulvar cellulitis on IV antibiotic s/p I&D POD#1 with improved hyperglycemia on SQ insulin.     Plan:   Patient to restart V-Go at home. She will monitor blood glucoses closely.   Scheduled with [facility] endocrine on 12/13 at 2 PM      Please feel free to call me with any questions or concerns. Pager# [phone], [phone]  04/01/2017  10:25 AM    Prentice Docker NP BC-ADM   Department of Endocrinology  [address], [place], [place] [address]  Phone: [phone]  Fax: [phone]  Pager: [phone]

## 2017-04-01 NOTE — Plan of Care (Signed)
Problem: Safe Discharge Barriers  Goal: Safe Discharge  Outcome: Completed or Resolved Date Met: 04/01/17    Intervention: Homecare Services  Pt was referred to Angela Nevin RN for [facility] [phone].    Vira Browns, LMSW  Pager [phone]  04/01/2017 1:06 PM

## 2017-04-01 NOTE — Plan of Care (Signed)
Referral for home care service has been received from Minus Breeding, SW, and we have   _x___Accepted _____Declined.   [facility] will continue to follow patient throughout hospital stay and plan for home care needs.    Patient will be seen by home nursing on 04/02/17.         Angela Nevin, RN, [facility]  [facility] [phone]

## 2017-04-01 NOTE — Progress Notes (Signed)
GYNECOLOGY INPATIENT PROGRESS NOTE       LOS: 2 days       Subjective     Kaylee Harris is resting comfortably in bed.  Tolerating regular diet.  She reports ongoing pain from the site of her vulvar lesion radiating into her groin.  She is very concerned about the pain medication she will be discharged with.    Objective     Vitals:    03/31/17 2100 03/31/17 2300 04/01/17 0135 04/01/17 0553   BP: 134/80 141/87 138/60 138/85   BP Location: Left arm Left arm Left arm Left arm   Pulse: 95 92 91 91   Resp: 14 14 14 14    Temp: 35.3 C (95.5 F) 35.8 C (96.4 F) 35.4 C (95.7 F) 35.1 C (95.2 F)   TempSrc: Temporal Temporal Temporal Temporal   SpO2: 93% 93% 93% 93%   Weight:       Height:         General: NAD, well-appearing  Mental Status: Alert and oriented x 3  HEENT: Normocephalic, atraumatic  Cardiovascular: Regular rate   Respiratory: Normal respiratory effort  Abdomen: soft, nontender  Vulva: Right labia major swollen and erythematous, soft to palpation, nonfluctuant.    Assessment & Plan     Kaylee Harris is a 41 y.o. with T2DM on insulin pump and right vulvar cellulitis without fluctuant mass admitted for IV antibiotics.  S/p I&D/EUA on 11/26.    Right vulvar cellulitis  - IV clindamycin (11/24 - ) and IV vancomycin (11/25 - )  - Afebrile overnight  - Continue ibuprofen, tylenol, oxycodone PRN for pain.   - No fluctuant collection identified on exam this morning    T2DM  - Endocrine following, appreciate recommendations  [facility] regimen: Lantus 30 units QHS, 10 units w/ meals with 1:40 correction > 140 mg/dl.     Anxiety  - continue home buspar    Fibromyalgia  - continue home cymbalta, lyrica    Hyperlipidemia  - continue home atorvastatin    Bipolar Disorder  - home lamictal, mirtazapine ordered    DVT prophylaxis: ambulation, SCDs    Dispo: pending transition to oral antibiotics.    Arlean Hopping, DO  Obstetrics/Gynecology PGY-3  Pager [phone]

## 2017-04-01 NOTE — Discharge Summary (Signed)
Name: [name] MRN: [id number] DOB: 1975/09/24     Admit Date: 03/28/2017   Date of Discharge: 04/01/2017     Patient was accepted for discharge to   Home or Self Care [1]           Discharge Attending Physician: Ian Bushman, MD      Hospitalization Summary    CONCISE NARRATIVE: Kaylee Harris is a 41 y.o. female with type 2 diabetes mellitus on an insulin pump who was admitted with right vulvar cellulitis. She received IV antibioitcs throughout her hospital stay and had an exam done under anesthesia which showed no area amenable to incision and drainage. She remained afebrile and was transitioned to oral antibiotics. She was discharged home in good condition on oral bactrim for a total of 10 days of antibiotics therapy. She will follow-up in the office in 3 days.                           Signed: Alphonzo Grieve, MD  On: 04/01/2017  at: 11:01 AM

## 2017-04-01 NOTE — Progress Notes (Signed)
Brief GYN R4 progress note:    In to see Kaylee Harris prior to discharge. Reviewed decision for discharge home on Bactrim for MRSA coverage. Pt scheduled for follow up appointment in 72h. Clinical image captured to aid in continuity of care (see below). Per endocrine, plan for her to re-attach and restart insulin pump just prior to discharge (her partner is bringing it in from home). Discussed pain control with alternating tylenol/motrin ATC, oxycodone PRN, hot compresses and sitz baths.

## 2017-04-02 ENCOUNTER — Encounter: Payer: Self-pay | Admitting: Gastroenterology

## 2017-04-02 ENCOUNTER — Encounter: Payer: Self-pay | Admitting: Primary Care

## 2017-04-02 NOTE — Anesthesia Postprocedure Evaluation (Signed)
Anesthesia Post-Op Note    Patient: Kaylee Harris    Procedure(s) Performed:  Procedure Summary  Date:  03/31/2017 Anesthesia Start: 03/31/2017  2:29 PM Anesthesia Stop: 03/31/2017  3:20 PM Room / Location:  [id number] / [place] MA[facility] OR   Procedure(s):  VAGINAL EXAM UNDER ANESTHESIA Diagnosis:  Labial abscess [N76.4] Surgeon(s):  Jennette Banker, MD  Gaylyn Cheers, Harley Hallmark, MD Attending Anesthesiologist:  Boyce Medici, DO         Recovery Vitals  BP: 114/80 (04/01/2017  9:45 AM)  Heart Rate: 96 (04/01/2017  9:45 AM)  Heart Rate (via Pulse Ox): 69 (03/31/2017  4:00 PM)  Resp: 16 (04/01/2017  9:45 AM)  Temp: 35.9 C (96.6 F) (04/01/2017  9:45 AM)  SpO2: 96 % (04/01/2017  9:45 AM)  O2 Flow Rate: 2 L/min (03/31/2017  7:00 PM)   0-10 Scale: 6 (04/01/2017  1:17 PM)  Anesthesia type:  General  Complications Noted During Procedure or in PACU:  None   Comment:    Patient Location:  [facility] Surgical Floor  Level of Consciousness:    Recovered to baseline  Patient Participation:     Able to participate  Temperature Status:    Normothermic  Oxygen Saturation:    Within patient's normal range  Cardiac Status:   Within patient's normal range  Fluid Status:    Stable  Airway Patency:     Yes  Pulmonary Status:    Baseline  Pain Management:    Adequate analgesia  Nausea and Vomiting:  None    Post Op Assessment:    Tolerated procedure well and no evidence of recall   Attending Attestation:  All indicated post anesthesia care provided       Complications Noted During Recovery Period:      None  Recovery from Anesthesia:      Recovered to baseline level of consciousness  Condition of patient:      Recovered to pre-anesthetic condition

## 2017-04-04 ENCOUNTER — Encounter: Payer: Self-pay | Admitting: Obstetrics and Gynecology

## 2017-04-04 ENCOUNTER — Ambulatory Visit: Payer: Medicare (Managed Care) | Attending: Obstetrics and Gynecology | Admitting: Obstetrics and Gynecology

## 2017-04-04 VITALS — BP 130/80 | Wt 286.2 lb

## 2017-04-04 DIAGNOSIS — N764 Abscess of vulva: Secondary | ICD-10-CM

## 2017-04-04 MED ORDER — GABAPENTIN 300 MG PO CAPSULE *I*
300.0000 mg | ORAL_CAPSULE | Freq: Two times a day (BID) | ORAL | 0 refills | Status: DC
Start: 2017-04-04 — End: 2017-04-17

## 2017-04-04 MED ORDER — OXYCODONE-ACETAMINOPHEN 5-325 MG PO TABS *I*
1.0000 | ORAL_TABLET | Freq: Two times a day (BID) | ORAL | 0 refills | Status: DC | PRN
Start: 2017-04-04 — End: 2017-04-17

## 2017-04-04 MED ORDER — OXYCODONE-ACETAMINOPHEN 5-325 MG PO TABS *I*
1.0000 | ORAL_TABLET | Freq: Two times a day (BID) | ORAL | 0 refills | Status: DC | PRN
Start: 2017-04-04 — End: 2017-04-04

## 2017-04-04 MED ORDER — GABAPENTIN 300 MG PO CAPSULE *I*
300.0000 mg | ORAL_CAPSULE | Freq: Two times a day (BID) | ORAL | 0 refills | Status: DC
Start: 2017-04-04 — End: 2017-04-04

## 2017-04-04 NOTE — Progress Notes (Signed)
Kaylee Harris  Location: [facility] is a 41 y.o. . who presents for follow up regarding a right labial abscess.  She was admitted to [facility] on 11/24 for IV clindamycin in setting of left labial abscess, subjective fevers and poorly controlled DM.  During admission was taken to OR for EUA and assessment for drainage of abscess - it was not amenable to drainage.  She was discharged home for planned 7d course of bactrim.    Since d/c has continued to have significant pain and subjective chills.  Feels like abscess is getting smaller.  No spontaneous drainage.  Is taking ATC ibuprofen and tylenol.  Was given 6 percocet on d/c, she is out of these.  BGs are in the 200-300s fasting.  Has appt with her endocrinologist next week.      Patient's medications, allergies, past medical, surgical, obstetrical, gynecologic, social and family histories were reviewed and updated as appropriate.    General ROS: negative  positive for  - chills and fatigue   Respiratory ROS: no cough, shortness of breath, or wheezing  Cardiovascular ROS: no chest pain or dyspnea on exertion  Gastrointestinal ROS: no abdominal pain, change in bowel habits, or black or bloody stools  Urinary ROS: no dysuria, trouble voiding, or hematuria  Musculoskeletal ROS: negative    Objective     BP 130/80    Wt 129.8 kg (286 lb 3.2 oz)    LMP  (LMP Unknown)    BMI 43.52 kg/m      General: obese, NAD   Abdomen: soft, obese, nt    Pelvic:   Left labia majora with small 60mm abscess with spontaneous drainage, minimal surrounding erythema.  Right labia majora with firm, indurated mass significantly decreased in size since exam on 11/24 (by same examiner), mild surrounding erythema but no other induration, skin changes.  Mons normal in appearance but tender to palpation        Assessment     Kaylee Harris is an 41 y.o. No obstetric history on file. presenting to the office for labial  abscess.     1. Labial abscess           Plan     Patient with continued clinical improvement now on oral bactrim after admission for IV abx, no I&D performed.  Suspect that underlying diabetic neuropathy is exacerbating her pain.  BGs remain poorly controlled.  Discussed importance of notifying office or coming to ER if any rapid expansion of erythema, worsening pain or fevers.      Will try gabapentin BDx14 days to address neuropathic pain component.  Patient still requiring oral narcotics, refill for #10 pills provided.      Follow up in office in one week.    Dispo:  She will follow-up in 1 week.    15 min face to face time spent with the patient; >50% of this time was spent on counseling and coordination of care.    Anderson Malta, MD 04/04/2017 3:23 PM

## 2017-04-07 ENCOUNTER — Ambulatory Visit: Payer: Medicare (Managed Care) | Admitting: Obstetrics and Gynecology

## 2017-04-09 ENCOUNTER — Ambulatory Visit: Payer: Medicare (Managed Care) | Admitting: Registered Nurse

## 2017-04-11 ENCOUNTER — Ambulatory Visit: Payer: Medicare (Managed Care) | Admitting: Obstetrics and Gynecology

## 2017-04-14 ENCOUNTER — Ambulatory Visit: Payer: Medicare (Managed Care) | Admitting: Obstetrics and Gynecology

## 2017-04-15 ENCOUNTER — Other Ambulatory Visit: Payer: Self-pay | Admitting: Primary Care

## 2017-04-17 ENCOUNTER — Ambulatory Visit: Payer: Medicare (Managed Care) | Attending: Psychiatry | Admitting: Psychiatry

## 2017-04-17 ENCOUNTER — Encounter: Payer: Self-pay | Admitting: Psychiatry

## 2017-04-17 VITALS — Resp 19 | Ht 68.0 in | Wt 283.0 lb

## 2017-04-17 DIAGNOSIS — Z7189 Other specified counseling: Secondary | ICD-10-CM

## 2017-04-17 DIAGNOSIS — E1165 Type 2 diabetes mellitus with hyperglycemia: Secondary | ICD-10-CM

## 2017-04-17 NOTE — Progress Notes (Signed)
Endocrinology, Diabetes, and Metabolism New Patient Referral for Diabetes.    Consult requested by: [name] MD    HPI:    This is a 41 y.o. female with a history of Type 2 Diabetes for almost 10 years.  Known complications include none.  She recently had cellulitis on her labia requiring surgical intervention and wound packing.   Reports that blood sugars became worse with the infection and has a hard time controlling them with the current VGo system. She is giving up to 10 clicks at certain meals and is finding she is running out of bolus insulin to give.She denies any hypoglycemia or LOC. She is interested in insulin pump therapy beyond the Vgo and is motivated to learn about the more technical pumps as well as learning more about proper nutrition and carb counting. She has been seeing Gay Filler for some time now. Her partner is here today and offering support to help the pt get better control of her Diabetes. She has had reasonable DM control up to the last 6 months or so. She denies any polyuria or polydipsia, no double or blurry vision, no CP or SOB. No complaint paresthesias    Current diabetes regimen:  Basal insulin:  VGo 30 kit       Bolus insulin:  36  TDI: 76  Other medications:  trulicity listed below    she does not have a prescription for glucagon at home; family knows how to use if when needed.  she does not have ketostrips at home.    Diabetic medications are not always taken appropriately.  There are few missed doses a week.      Blood sugar logs and/or meter were brought to the visit for review.    Blood sugars range mid 100s-250s hypoglycemia occurs 0 times a week.  It is most often at never.  There has been the need for medical assistance for these episodes none.      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 Lower abdomen with cellulitis changes, wound probed and purulent material expressed.  Had PICC line for "multiple infiltrations" of what? D/c-ed home on 10d of Augmentin 9/11.      Anginal pain     Anxiety     Arthritis     Asthma     Depression     Diabetes mellitus     Previously on SU and metformin, now diet controlled    Diverticulitis 09/2010    Dysfunctional uterine bleeding     Fibromyalgia     GERD (gastroesophageal reflux disease)     GERD (gastroesophageal reflux disease) 10/04/2016    High blood pressure     Hyperlipidemia     Liver disease     fatty liver     Long term (current) use of insulin, Dermal Adhesed V-Go Insulin Delivery Device 10/04/2016    Migraine     Neuromuscular disorder     POTS (postural orthostatic tachycardia syndrome)     Sebaceous cyst of breast     right axilla    TMJ click, left 11/08/6431    Varicella      Past  Surgical History:   Procedure Laterality Date    APPENDECTOMY  2016    arthroscopic shoulder surgery Right 2010    Related to lifting    CARDIAC CATHETERIZATION  09/2011    negative    COLON SURGERY      DILATION AND CURETTAGE OF UTERUS  2005, 2007    x2 for menorraghia    LEFT COLECTOMY  01/03/14    Dr Tresa Res    loop recorder  Feb 03 2014    loop recorder removal      PR COLONOSCOPY THRU COLOTOMY N/A 02/16/2016    Procedure: COLONOSCOPY;  Surgeon: Kelly Splinter, MD;  Location: [place];  Service: GI    PR CYSTOURETHROSCOPY,BIOPSY N/A 10/17/2016    Procedure: CYSTOSCOPY BLADDER ;  Surgeon: Ed Blalock, MD;  Location: [place] [facility] OR;  Service: Urology    PR EDG TRANSORAL BIOPSY SINGLE/MULTIPLE N/A 10/29/2016    Procedure: EGD;  Surgeon: Kelly Splinter, MD;  Location: [place];  Service: GI    TONSILLECTOMY       Family History   Problem Relation Age of Onset    Hypertension Father     Diabetes Father      Kidney Disease Father     Elevated lipids Father     Heart attack Father 33    Other Father         PVD    Heart Disease Father     Hypertension Mother     Elevated lipids Mother     Heart attack Mother 63    Diabetes Mother     Eczema Mother     Psoriasis Mother     Heart Disease Mother     Hypertension Brother     Heart Disease Brother 26        prinzmetal's angina    Heart Disease Sister         currently having work up    Mathews Sister     Hypertension Sister     Breast cancer Maternal Grandmother     Stroke Other     Cancer Sister     Thyroid disease Sister     Anesth problems Neg Hx      Social History     Social History    Marital status: Married     Spouse name: N/A    Number of children: N/A    Years of education: N/A     Social History Main Topics    Smoking status: Former Smoker     Packs/day: 0.50     Years: 3.00     Types: Cigarettes    Smokeless tobacco: Never Used      Comment: quit age late 100s    Alcohol use 0.0 oz/week      Comment: 0-1 drink/ month    Drug use: No    Sexual activity: Yes     Partners: Female     Patent examiner protection: IUD     Other Topics Concern    None     Social History Narrative    ** Merged History Encounter **         Married to [place], recently relocated back to [place] from [place] due to family stressors. 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        Allergies:   Allergies   Allergen Reactions    Morphine Itching    Trazodone Anaphylaxis  Seasonal Allergies Itching    Other [Other] Other (See Comments)     Derma Bond(skin glue) pruritis/redness and c/o respiratory distress.   Received name: Other       Current Outpatient Prescriptions on File Prior to Visit   Medication Sig Dispense Refill    SUMAtriptan (IMITREX) 50 MG tablet TAKE 1 TABLET BY MOUTH AS NEEDED FOR MIGRAINE, TAKE AT ONSET OF HEADACHE. MAY REPEAT ONCE IN 2 HOURS 9 tablet 5    ibuprofen (ADVIL,MOTRIN) 600 MG tablet Take 1  tablet (600 mg total) by mouth 3 times daily as needed for Pain 30 tablet 0    promethazine (PHENERGAN) 25 MG tablet Take 1 tablet (25 mg total) by mouth every 4-6 hours as needed for Nausea 30 tablet 0    TRULICITY 1.5 KV/4.2VZ injection pen INJECT THE CONTENTS OF ONE PEN (0.5ML) INTO THE SKIN EVERY 7 DAYS 2 mL 5    pregabalin (LYRICA) 150 MG capsule Take 1 capsule (150 mg total) by mouth 2 times daily   Max daily dose: 300 mg MAXIMUM DAILY DOSE OF 2 PER DAY 60 capsule 0    DULoxetine (CYMBALTA) 20 MG DR capsule Take 1 capsule (20 mg total) by mouth daily 30 capsule 2    fluticasone (FLONASE) 50 MCG/ACT nasal spray 1 spray by Nasal route daily 48 g 3    clonazePAM (KLONOPIN) 0.5 MG tablet Take 1 tablet (0.5 mg total) by mouth daily as needed   Max daily dose: 0.5 mg 28 tablet 0    TOUJEO SOLOSTAR 300 UNIT/ML SOPN INJECT 30 UNITS INTO THE SKIN NIGHTLY 7.5 mL 5    lamoTRIgine (LAMICTAL) 150 MG tablet TAKE 1 TABLET BY MOUTH EVERY DAY 90 tablet 0    traMADol (ULTRAM) 50 MG tablet Take 1 tablet (50 mg total) by mouth every 8 hours as needed (for head pain and abd pain)   Max daily dose: 150 mg 42 tablet 0    busPIRone (BUSPAR) 30 MG tablet TAKE 1 TABLET BY MOUTH TWO TIMES DAILY 60 tablet 2    mirtazapine (REMERON) 15 MG tablet Take 1 tablet (15 mg total) by mouth nightly 30 tablet 2    SUMAVEL DOSEPRO 6 MG/0.5ML needle-free injection INJECT 0.5MLS (6MG TOTAL) UNDER THE SKIN AS NEEDED FOR MIGRAINE MAY REPEAT ONCE AFTER 1 HOUR IF NEEDED  1    cetirizine (ZYRTEC) 10 MG tablet Take 1 tablet (10 mg total) by mouth daily 90 tablet 1    continuous blood glucose monitor (FREESTYLE LIBRE) reader Use with Freestyle Libre CGM sensor 1 Device 0    continuous blood glucose monitor (FREESTYLE LIBRE) sensor Apply sensor to back of upper arm. Replace sensor every 10 days. 3 each 5    pantoprazole (PROTONIX) 40 MG EC tablet Take 1 tablet (40 mg total) by mouth daily   SWALLOW WHOLE. DO NOT CRUSH, BREAK, OR CHEW. 30  tablet 5    topiramate (TOPAMAX) 100 MG tablet Take 1 tablet (100 mg total) by mouth 2 times daily 60 tablet 5    atorvastatin (LIPITOR) 40 MG tablet Take 1 tablet (40 mg total) by mouth daily (with dinner) 90 tablet 3    promethazine (PHENERGAN) 12.5 MG tablet Take 1 tablet (12.5 mg total) by mouth 4 times daily as needed 30 tablet 0    albuterol HFA 108 (90 Base) MCG/ACT inhaler Inhale 1-2 puffs into the lungs every 6 hours as needed for Wheezing   Shake well before each use. 1 Inhaler 5  insulin lispro 100 UNIT/ML injection vial For VGO: Dinner 41U (6 clicks) ; B & L 4- 6 units. BG 100 - 150 8u (4clk), 151 - 200 10U (5clk), >201 12U ( 6 ck). MDD 72U (36 clk) 30 mL 11    phenazopyridine (PYRIDIUM) 100 MG tablet Take 1 tablet (100 mg total) by mouth every 8 hours as needed 20 tablet 0    famciclovir (FAMVIR) 500 MG tablet Take 1 tablet (500 mg total) by mouth 3 times daily as needed 30 tablet 5    Insulin Disposable Pump (V-GO 30) KIT Use daily; 30ct/month. 30 kit 11    dulaglutide (TRULICITY) 1.5 LA/4.5XM injection pen Inject 0.5 mLs (1.5 mg total) into the skin every 7 days 2 mL 5    metoprolol (TOPROL-XL) 25 MG 24 hr tablet Take by mouth daily         ascorbic acid (VITAMIN C) 100 MG tablet Take 100 mg by mouth daily      midodrine (PROAMATINE) 10 MG tablet Take 1 tablet (10 mg total) by mouth 3 times daily 90 tablet 0    insulin syringe-needle U-100 (BD ULTRAFINE) 31G X 5/16" 0.3 ML HALF-UNIT Use 3 times a day as instructed. 100 each 0    FREESTYLE LITE test strip Use four times daily as directed for 250.02 100 each 11    blood glucose monitor system Brand: cheapest brand available per her insurance.  Use as directed. 1 kit 0    lancets Brand Free Style Lite; Use 2 times per day as directed for blood glucose testing. 100 each 2    Alcohol Swabs (ALCOHOL WIPES) PADS Use BID for BG check 100 each 1    levonorgestrel (MIRENA) 20 MCG/24HR IUD 1 each by Intrauterine route once      Non-System  Medication The above patient is followed in our clinic and cannot resume work permanently. 1 each 1     No current facility-administered medications on file prior to visit.        Review of Systems:  CONSTITUTIONAL:  Appetite good, no fevers, night sweats or weight loss  HEAD: No headache, dizziness or syncope  EYES:No vision changes or eye pain  ENT: No hearing difficulties or ear pain  CV: No chest pain, shortness of breath or edema.  RESPIRATORY:  No SOB, cough or wheezing  GI:  No nausea, vomiting, abdominal pain or change in bowel habits  GU:  No dysuria, urgency or incontinence  NEURO:  No mental status changes, motor weakness or sensory changes  PSYCH:  + depression and anxiety  MS:  No joint pain, swelling or musculoskeletal deformities  SKIN:  No rashes  HEME/LYMPH:  No easy bleeding, bruising or swollen nodes  ENDOCRINE:  No polyuria, polydipsia, polyphagia or heat/cold intolerance    Physical Examination:   Vitals:    04/17/17 1435   Resp: 19   Weight: 128.4 kg (283 lb)   Height: 1.727 m ('5\' 8"' )     Body mass index is 43.03 kg/m.  Wt Readings from Last 3 Encounters:   04/17/17 128.4 kg (283 lb)   04/04/17 129.8 kg (286 lb 3.2 oz)   03/31/17 128.4 kg (283 lb)       CONSTITUTIONAL:  well developed, well nourished, no acute distress  HEENT: EOMI, no lid lag, pink conjunctiva, no proptosis.   NECK: supple, no thyromegaly, no lymphadenopathy, no acanthosis nigricans  HEART/VASCULAR: RRR, normal S1, S2, no MRG, no JVD or carotid bruit, no LE edema,  clubbing, or cyanosis.  Pedal pulses 2+ bilaterally.  CHEST: lungs clear to auscultation bilaterally posteriorly  ABDOMEN: soft, nontender, nondistended  EXTREMITIES:  joints without deformity or synovitis. Both feet clean without deformity, callous, ulceration, or fungal infection.  NEUROLOGICAL: alert and oriented x 3. Full power bilateral upper and lower extremities.  Monofilament testing intact bilateral and normal vibratory sensation at distal extremities  bilaterally. No tremor noted.  SKIN:  No rashes. No lipohypertrophy.  LYMPH: no palpable lymphadenopathy.  ENDOCRINE: No supraclavicular fat pads, facial plethora, moon facies, abdominal striae, thyromegaly.  PSYCH: stable and followed by psychiatry      Labs and Imaging:    I personally reviewed and confirmed all laboratory and radiology testing listed below:      Lab results: 03/28/17  1717   Sodium 140   Potassium 4.1   Chloride 105   CO2 22   UN 10   Creatinine 0.77   GFR,Caucasian 95   GFR,Black 109   Glucose 149*   Calcium 9.5         Lab Results   Component Value Date    HA1C 11.0 (H) 02/17/2017       Lab Results   Component Value Date    ALT 42 (H) 02/17/2017    AST 32 02/17/2017     Lab Results   Component Value Date    CHOL 154 12/27/2015    HDL 33 12/27/2015    LDLC 60 12/27/2015    TRIG 304 (!) 12/27/2015    [place] 4.7 12/27/2015     Lab Results   Component Value Date    TSH 1.31 07/10/2015       Lab Results   Component Value Date    WBC 8.1 03/30/2017    HGB 11.8 03/30/2017    HCT 37 03/30/2017    MCV 88 03/30/2017    PLT 319 03/30/2017       Assessment: This is a 41 y.o. female with Type 2 Diabetes Uncontrolled with hyperglycemia.  Goal A1c is <7% likley with poor nutritional choices, occasional missing insulin doses and overall insufficient insulin. If she maintains her motivation and will carb count and check BGs consistently, she may be a candidate for an advanced pump in the future    Plan:   Changes in diabetic regimen are as follows: increase insulin to VGo 40  First appt with Mickel Baas - nutrition and carb counting. Gay Filler for ongoing education and pump review  BP is at goal on current therapy.  On statin therapy.  Recommend annual dilated eye exams and biannual dental exams for routine health maintenance.    Follow up: 3 month(s) or sooner if needed.      Nada Libman, NP    Nada Libman, NP  [facility] Department of Endocrinology  [address] [place], [place]  [address]  Phone [phone]  Fax [phone]

## 2017-04-18 ENCOUNTER — Ambulatory Visit
Admission: RE | Admit: 2017-04-18 | Discharge: 2017-04-18 | Disposition: A | Discharge status: Home / Self Care | Payer: Medicare (Managed Care) | Source: Ambulatory Visit

## 2017-04-18 ENCOUNTER — Encounter: Payer: Self-pay | Admitting: Primary Care

## 2017-04-18 ENCOUNTER — Encounter: Payer: Self-pay | Admitting: Physical Medicine and Rehabilitation

## 2017-04-18 ENCOUNTER — Other Ambulatory Visit
Admission: RE | Admit: 2017-04-18 | Discharge: 2017-04-18 | Disposition: A | Discharge status: Home / Self Care | Payer: Medicare (Managed Care) | Source: Ambulatory Visit | Attending: Psychiatry | Admitting: Psychiatry

## 2017-04-18 ENCOUNTER — Telehealth: Payer: Self-pay

## 2017-04-18 ENCOUNTER — Ambulatory Visit
Payer: Medicare (Managed Care) | Attending: Physical Medicine and Rehabilitation | Admitting: Physical Medicine and Rehabilitation

## 2017-04-18 VITALS — BP 142/98 | Ht 68.0 in | Wt 283.0 lb

## 2017-04-18 DIAGNOSIS — M5136 Other intervertebral disc degeneration, lumbar region: Secondary | ICD-10-CM

## 2017-04-18 DIAGNOSIS — E1165 Type 2 diabetes mellitus with hyperglycemia: Secondary | ICD-10-CM | POA: Insufficient documentation

## 2017-04-18 DIAGNOSIS — M549 Dorsalgia, unspecified: Secondary | ICD-10-CM

## 2017-04-18 LAB — C-PEPTIDE: C-Peptide: 4.1 ng/mL (ref 1.1–4.4)

## 2017-04-18 LAB — GLUCOSE: Glucose: 158 mg/dL — ABNORMAL HIGH (ref 60–99)

## 2017-04-18 MED ORDER — DIAZEPAM 5 MG PO TABS *I*
ORAL_TABLET | ORAL | 0 refills | Status: DC
Start: 2017-04-18 — End: 2017-06-09

## 2017-04-18 NOTE — Telephone Encounter (Signed)
Please call in Humalog 4 vials and [id number] for [place]. Thanks!

## 2017-04-18 NOTE — Progress Notes (Signed)
ID: Kaylee Harris is a 41 y.o. year old  woman with multiple medical problems has had right hip and lower back pain low back pain ongoing for a number of years right hip pain that began several months ago with a fall.  She has some sense that the pain radiates from the back to the hip show some it's pain down the left leg and into the foot.  She's had physical therapy did not help she's taken anti-inflammatories does not help.  She reports no progressive weakness no loss brought bladder control and no saddle anesthesia.    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 Lower abdomen with cellulitis changes, wound probed and purulent material expressed.  Had PICC line for "multiple infiltrations" of what? D/c-ed home on 10d of Augmentin 9/11.      Anginal pain     Anxiety     Arthritis     Asthma     Depression     Diabetes mellitus     Previously on SU and metformin, now diet controlled    Diverticulitis 09/2010    Dysfunctional uterine bleeding     Fibromyalgia     GERD (gastroesophageal reflux disease)     GERD (gastroesophageal reflux disease) 10/04/2016    High blood pressure     Hyperlipidemia     Liver disease     fatty liver     Long term (current) use of insulin, Dermal Adhesed V-Go Insulin Delivery Device 10/04/2016    Migraine     Neuromuscular disorder     POTS (postural orthostatic tachycardia syndrome)     Sebaceous cyst of breast     right axilla    TMJ click, left 07/06/2023    Varicella        Surgical History:  Past Surgical History:   Procedure Laterality Date    APPENDECTOMY  2016    arthroscopic shoulder surgery Right 2010    Related to lifting    CARDIAC CATHETERIZATION  09/2011    negative    COLON SURGERY      DILATION AND CURETTAGE OF UTERUS  2005, 2007    x2 for  menorraghia    LEFT COLECTOMY  01/03/14    Dr Tresa Res    loop recorder  Feb 03 2014    loop recorder removal      PR COLONOSCOPY THRU COLOTOMY N/A 02/16/2016    Procedure: COLONOSCOPY;  Surgeon: Kelly Splinter, MD;  Location: [facility];  Service: GI    PR CYSTOURETHROSCOPY,BIOPSY N/A 10/17/2016    Procedure: CYSTOSCOPY BLADDER ;  Surgeon: Ed Blalock, MD;  Location: [facility];  Service: Urology    PR EDG TRANSORAL BIOPSY SINGLE/MULTIPLE N/A 10/29/2016    Procedure: EGD;  Surgeon: Kelly Splinter, MD;  Location: [facility];  Service: GI    TONSILLECTOMY               KYH:CWCBJS of Systems , 10 systems reviewed all normal  pleasant woman awake alert and oriented well-developed well-nourished no acute distress        Allergies:  Allergies   Allergen Reactions    Morphine Itching  Trazodone Anaphylaxis    Seasonal Allergies Itching    Other [Other] Other (See Comments)     Derma Bond(skin glue) pruritis/redness and c/o respiratory distress.   Received name: Other        Vitals:    04/18/17 0815   BP: (!) 142/98   Weight: 128.4 kg (283 lb)   Height: 1.727 m (5\' 8" )     Body mass index is 43.03 kg/m.  Physical Exam:    Observations: as above  Presentation:.presents a timely fashion we'll dress well-groomed significant other attends with her  Affect:.normal  Gait and station:.stable nonantalgic  Postures:.normal    Testing:.They get up and out of a chair independently and without difficulty,  get up and onto an exam table independently and without difficulty.  Straight leg raise is negative.  There is no deformity of the cervical thoracic or lumbar spine to visual or palpatory inspection.  There is no tenderness over the subgluteal or trochanteric bursa. There is no atrophy of either upper or lower extremity.  Hip range of motion does not reproduce  symptoms.    Range of motion:.  forward flex reaching hands to shins    There is no lymphadenopathy in the cervical,  inguinal or axillary regions.  Skin is intact in all four limbs and in the trunk.  Tibial and radial artery pulses are intact bilaterally.    Neuromuscular:Marland Kitchen    Motor Strength:.Strength is normal in the shoulder abductors, elbow flexors, wrist extensors and finger intrinsics bilaterally.  In the lower limbs strength is normal in the hip flexors, knee extensors, ankle dorsiflexors, and in the EHL bilaterally    Sensory Exam:.Sensation is normal to light touch to all dermatomes in the upper and lower extremities bilaterally.    Reflexes:.Reflexes are symmetric and normal at the biceps, triceps, brachioradialis, patellar tendon, and Achilles tendon bilaterally.  Toes are downgoing.  Romberg is negative.    Imaging:.  x-ray and MRI lumbar spine are pending    Other Diagnostics:.    Assessment:.   chronic low back pain possible radiating referred pain she's neurologically stable she is failed physical therapy and anti-inflammatories symptoms have been ongoing for more than 6 weeks  Plan:.  imaging is pending including x-ray and MRI she'll follow-up afterwards

## 2017-04-21 ENCOUNTER — Other Ambulatory Visit: Payer: Self-pay | Admitting: Psychiatry

## 2017-04-21 ENCOUNTER — Encounter: Payer: Self-pay | Admitting: Physical Medicine and Rehabilitation

## 2017-04-21 ENCOUNTER — Encounter: Payer: Self-pay | Admitting: Psychiatry

## 2017-04-21 DIAGNOSIS — IMO0002 Reserved for concepts with insufficient information to code with codable children: Secondary | ICD-10-CM

## 2017-04-22 ENCOUNTER — Encounter: Payer: Self-pay | Admitting: Psychiatry

## 2017-04-22 ENCOUNTER — Other Ambulatory Visit: Payer: Self-pay | Admitting: Psychiatry

## 2017-04-22 DIAGNOSIS — E119 Type 2 diabetes mellitus without complications: Secondary | ICD-10-CM

## 2017-04-22 MED ORDER — V-GO 40 KIT
PACK | 11 refills | Status: DC
Start: 2017-04-22 — End: 2017-07-17

## 2017-04-22 MED ORDER — INSULIN LISPRO (HUMAN) 100 UNIT/ML IJ/SC SOLN *WRAPPED*
SUBCUTANEOUS | 11 refills | Status: DC
Start: 2017-04-22 — End: 2018-05-19

## 2017-04-23 ENCOUNTER — Other Ambulatory Visit: Payer: Self-pay | Admitting: Psychiatry

## 2017-04-23 ENCOUNTER — Ambulatory Visit: Payer: Medicare (Managed Care) | Attending: Primary Care | Admitting: Primary Care

## 2017-04-23 ENCOUNTER — Other Ambulatory Visit: Payer: Self-pay | Admitting: Obstetrics and Gynecology

## 2017-04-23 ENCOUNTER — Other Ambulatory Visit: Payer: Self-pay | Admitting: Pulmonary and Critical Care Medicine

## 2017-04-23 ENCOUNTER — Encounter: Payer: Self-pay | Admitting: Primary Care

## 2017-04-23 VITALS — BP 108/76 | HR 84 | Temp 97.9°F | Ht 68.0 in | Wt 284.6 lb

## 2017-04-23 DIAGNOSIS — E119 Type 2 diabetes mellitus without complications: Secondary | ICD-10-CM | POA: Insufficient documentation

## 2017-04-23 DIAGNOSIS — N762 Acute vulvitis: Secondary | ICD-10-CM | POA: Insufficient documentation

## 2017-04-23 DIAGNOSIS — Z760 Encounter for issue of repeat prescription: Secondary | ICD-10-CM

## 2017-04-23 DIAGNOSIS — L609 Nail disorder, unspecified: Secondary | ICD-10-CM

## 2017-04-23 LAB — HM DIABETES FOOT EXAM

## 2017-04-23 NOTE — Progress Notes (Signed)
[facility]    SUBJECTIVE    Pt is here to discuss:    Chief Complaint   Patient presents with    [facility] admit follow-up     lifetime care to discharge patient, cellulitis of the labia, has since resolved      1. F/u labial abscess vs cellulitis- pt presented to UC on 11/23 for cellulitis of the labia. She was transported to the ED and subsequently hospitalized for IV abx. GYN providers attempted exploration and I&D in OR but no drainable focus found.  She was sent home with a 7d course of Bactrim.  Sx have since resolved. Pt had home nursing who inspected her and feel that infection has resolved.     2. DM- DM is uncontrolled, saw endo on 12/13, and an insulin pump was ordered yesterday. Pt's DM was best controlled when using VGO, and attributes the recent rise on A1c due to lack of VGO supplies. Back on track now.  Denies polyuria, polydipsia or hypoglycemia sx. Denies peripheral paresthesias.     Does check feet, reports an ingrown R great toenail, recurrent infections per pt. She has been using Gold Bond DM cream with some relief. Does not have a podiatrist, would like a referral.   Lab Results   Component Value Date    HA1C 11.0 (H) 02/17/2017    HA1C 7.4 (H) 05/21/2016    HA1C 7.3 (H) 12/27/2015    MALBR 0.31 09/11/2015    CREAT 0.77 03/28/2017    LDLC 60 12/27/2015     3. Syncope, fibromyalgia - remarkable improvement. Pt is walking without her walker. Continues to see psych and cardiology. No recent syncope.       PMH / Family Hx / Social Hx  Patient's medications, allergies, problem list, past medical, social histories were reviewed and notable for:    Current Outpatient Prescriptions   Medication Sig Note    Insulin Disposable Pump (V-GO 40) KIT Use as directed     insulin lispro 100 UNIT/ML injection vial Inject as directed up to MDD 100 units     diazepam (VALIUM) 5 MG tablet Take one tablet one hour prior to procedure.  May repeat x1 if needed 15 minutes before procedure.     SUMAtriptan  (IMITREX) 50 MG tablet TAKE 1 TABLET BY MOUTH AS NEEDED FOR MIGRAINE, TAKE AT ONSET OF HEADACHE. MAY REPEAT ONCE IN 2 HOURS     ibuprofen (ADVIL,MOTRIN) 600 MG tablet Take 1 tablet (600 mg total) by mouth 3 times daily as needed for Pain     promethazine (PHENERGAN) 25 MG tablet Take 1 tablet (25 mg total) by mouth every 4-6 hours as needed for Nausea     TRULICITY 1.5 GN/5.6OZ injection pen INJECT THE CONTENTS OF ONE PEN (0.5ML) INTO THE SKIN EVERY 7 DAYS     pregabalin (LYRICA) 150 MG capsule Take 1 capsule (150 mg total) by mouth 2 times daily   Max daily dose: 300 mg MAXIMUM DAILY DOSE OF 2 PER DAY     DULoxetine (CYMBALTA) 20 MG DR capsule Take 1 capsule (20 mg total) by mouth daily     fluticasone (FLONASE) 50 MCG/ACT nasal spray 1 spray by Nasal route daily     clonazePAM (KLONOPIN) 0.5 MG tablet Take 1 tablet (0.5 mg total) by mouth daily as needed   Max daily dose: 0.5 mg     TOUJEO SOLOSTAR 300 UNIT/ML SOPN INJECT 30 UNITS INTO THE SKIN NIGHTLY  lamoTRIgine (LAMICTAL) 150 MG tablet TAKE 1 TABLET BY MOUTH EVERY DAY     traMADol (ULTRAM) 50 MG tablet Take 1 tablet (50 mg total) by mouth every 8 hours as needed (for head pain and abd pain)   Max daily dose: 150 mg     busPIRone (BUSPAR) 30 MG tablet TAKE 1 TABLET BY MOUTH TWO TIMES DAILY     mirtazapine (REMERON) 15 MG tablet Take 1 tablet (15 mg total) by mouth nightly     SUMAVEL DOSEPRO 6 MG/0.5ML needle-free injection INJECT 0.5MLS (6MG TOTAL) UNDER THE SKIN AS NEEDED FOR MIGRAINE MAY REPEAT ONCE AFTER 1 HOUR IF NEEDED 12/16/2016: Received from: External Pharmacy    cetirizine (ZYRTEC) 10 MG tablet Take 1 tablet (10 mg total) by mouth daily     continuous blood glucose monitor (FREESTYLE LIBRE) reader Use with Freestyle Libre CGM sensor     continuous blood glucose monitor (FREESTYLE LIBRE) sensor Apply sensor to back of upper arm. Replace sensor every 10 days.     pantoprazole (PROTONIX) 40 MG EC tablet Take 1 tablet (40 mg total) by  mouth daily   SWALLOW WHOLE. DO NOT CRUSH, BREAK, OR CHEW.     topiramate (TOPAMAX) 100 MG tablet Take 1 tablet (100 mg total) by mouth 2 times daily     atorvastatin (LIPITOR) 40 MG tablet Take 1 tablet (40 mg total) by mouth daily (with dinner)     promethazine (PHENERGAN) 12.5 MG tablet Take 1 tablet (12.5 mg total) by mouth 4 times daily as needed     albuterol HFA 108 (90 Base) MCG/ACT inhaler Inhale 1-2 puffs into the lungs every 6 hours as needed for Wheezing   Shake well before each use.     phenazopyridine (PYRIDIUM) 100 MG tablet Take 1 tablet (100 mg total) by mouth every 8 hours as needed     famciclovir (FAMVIR) 500 MG tablet Take 1 tablet (500 mg total) by mouth 3 times daily as needed     dulaglutide (TRULICITY) 1.5 MG/8.6PY injection pen Inject 0.5 mLs (1.5 mg total) into the skin every 7 days     metoprolol (TOPROL-XL) 25 MG 24 hr tablet Take by mouth daily    06/10/2016: Received from: External Pharmacy    ascorbic acid (VITAMIN C) 100 MG tablet Take 100 mg by mouth daily     midodrine (PROAMATINE) 10 MG tablet Take 1 tablet (10 mg total) by mouth 3 times daily     insulin syringe-needle U-100 (BD ULTRAFINE) 31G X 5/16" 0.3 ML HALF-UNIT Use 3 times a day as instructed.     FREESTYLE LITE test strip Use four times daily as directed for 250.02     blood glucose monitor system Brand: cheapest brand available per her insurance.  Use as directed.     lancets Brand Free Style Lite; Use 2 times per day as directed for blood glucose testing.     Alcohol Swabs (ALCOHOL WIPES) PADS Use BID for BG check     levonorgestrel (MIRENA) 20 MCG/24HR IUD 1 each by Intrauterine route once     Non-System Medication The above patient is followed in our clinic and cannot resume work permanently.           OBJECTIVE  Vitals:    04/23/17 1215   BP: 108/76   Pulse: 84   Temp: 36.6 C (97.9 F)   Weight: 129.1 kg (284 lb 9.6 oz)   Height: 1.727 m ('5\' 8"' )     Body  mass index is 43.27 kg/m.      General:  well-appearing morbidly obese Caucasian female, pleasant & conversant, in NAD. Here without walker. Here with wife.   Psych: AAOx3, remarkably positive affect and demeanor. Moves quickly in and out of exam room, engaged. Insight and judgement intact.   Feet: No evidence of infection or ischemia b/l. DP and TP pulses 2+ b/l. Sensation to monofilament testing is intact. R first toenail with evidence of previous partial nail removal, and interval nail regrowth. Distal aspect is cracked and detached form the majority of the nail. No signs of infection.          ASSESSMENT & PLAN  1. Cellulitis of labia  Resolved. Likely so severe due to uncontrolled DM.     2. Type 2 diabetes mellitus  3. Nail abnormalities  Agreed with insulin pump. Will update labs as below. Referred to podiatry for DM nail care and assessment of the chronic crack/detached distal nail   - AMB REFERRAL TO PODIATRY  - Microalbumin, Urine, Random; Future  - Lipid add Rfx to Drt LDL if Trig >400; Future  - Microalbumin, Urine, Random         Follow-up: 84mofor DM, syncope, fibro      JJohny Drilling MD  [place] 04/23/2017  12:36 PM      I CLuellen Pucker am scribing for and in the presence of Dr. JJohny Drilling 04/23/2017 12:37 PM.    I, Dr. JJohny Drilling MD, personally performed the services described in this documentation, as scribed by CLuellen Puckerin my presence, and it is accurate and complete.  I have reviewed, added to, and completed this documentation personally and assure that it is complete and accurate. 04/25/2017 11:05 AM        ______________________

## 2017-04-24 ENCOUNTER — Encounter: Payer: Self-pay | Admitting: Gastroenterology

## 2017-04-24 ENCOUNTER — Other Ambulatory Visit: Payer: Self-pay | Admitting: Psychiatry

## 2017-04-24 ENCOUNTER — Ambulatory Visit: Payer: Medicare (Managed Care) | Admitting: Urology

## 2017-04-24 LAB — MICROALBUMIN, URINE, RANDOM
Creatinine,UR: 141 mg/dL (ref 20–300)
Microalb/Creat Ratio: 3 mg MA/g CR (ref 0.0–29.9)
Microalbumin,UR: 0.43 mg/dL

## 2017-04-24 MED ORDER — PREGABALIN 150 MG PO CAPS *A*
150.0000 mg | ORAL_CAPSULE | Freq: Two times a day (BID) | ORAL | 0 refills | Status: DC
Start: 2017-04-24 — End: 2017-05-26

## 2017-04-24 MED ORDER — BUSPIRONE HCL 30 MG PO TABS *I*
30.0000 mg | ORAL_TABLET | Freq: Two times a day (BID) | ORAL | 2 refills | Status: DC
Start: 2017-04-24 — End: 2017-07-15

## 2017-04-24 MED ORDER — TRAMADOL HCL 50 MG PO TABS *I*
50.0000 mg | ORAL_TABLET | Freq: Three times a day (TID) | ORAL | 0 refills | Status: DC | PRN
Start: 2017-04-24 — End: 2017-11-04

## 2017-04-24 NOTE — Telephone Encounter (Signed)
Patient Name: Kaylee Harris Birth Date: [birth year]   Address: [place], [place] [address] Sex: Female   Rx Written Rx Dispensed Drug Quantity Days Supply Prescriber Name Payment Method Dispenser   04/04/2017 04/04/2017 oxycodone-acetaminophen 5-325 mg tablet  10 [place], [place]. #02   04/01/2017 04/01/2017 oxycodone hcl 5 mg tablet  6 1 [facility]   04/01/2017 04/01/2017 oxycodone hcl 5 mg capsule  6 1 [place] #02   03/24/2017 03/24/2017 lyrica 150 mg capsule  60 30 [place], [place]. #02   02/20/2017 02/21/2017 lyrica 75 mg capsule  60 30 [name], Darnell Level (MD) [place]. #02   02/18/2017 02/19/2017 clonazepam 0.5 mg tablet  28 28 Corigliano, [name] D NP [facility] #02   01/22/2017 01/22/2017 lyrica 75 mg capsule  60 30 [name], Darnell Level (MD) [place]. #02   01/21/2017 01/21/2017 tramadol hcl 50 mg tablet  42 14 [place], [place]. #02   11/21/2016 11/21/2016 diazepam 5 mg tablet  2 1 [name], Darnell Level (MD) [place]. #02   11/08/2016 11/08/2016 tramadol hcl 50 mg tablet  13 14 [name], Darnell Level (MD) [place]. #02   09/27/2016 10/01/2016 tramadol hcl 50 mg tablet  42 14 [place] [place]. #02   09/17/2016 09/17/2016 tramadol hcl 50 mg tablet  42 14 [place], [place]. #02   08/13/2016 08/19/2016 clonazepam 0.5 mg tablet  30 30 Corigliano, [name] D NP Insurance [place]. #02   06/21/2016 06/21/2016 diphenoxylate-atropine 2.5-0.025 mg tablet  240 30 [place] Nab A ([facility]) Insurance [place]. #02   06/04/2016 06/06/2016 tramadol hcl 50 mg tablet  36 14 [name], G (MD)  [place]. [phone]

## 2017-04-26 NOTE — Patient Instructions (Signed)
Almost 10 year

## 2017-04-28 ENCOUNTER — Telehealth: Payer: Self-pay

## 2017-04-28 ENCOUNTER — Other Ambulatory Visit: Payer: Self-pay | Admitting: Psychiatry

## 2017-04-28 MED ORDER — LAMOTRIGINE 150 MG PO TABS *I'
150.0000 mg | ORAL_TABLET | Freq: Every day | ORAL | 0 refills | Status: DC
Start: 2017-04-28 — End: 2017-07-18

## 2017-04-28 NOTE — Telephone Encounter (Signed)
Writer called pt to reschedule appointment due to provider out sick.

## 2017-04-30 ENCOUNTER — Other Ambulatory Visit: Payer: Self-pay | Admitting: Primary Care

## 2017-04-30 MED ORDER — ALCOHOL PREP PADS *I*
MEDICATED_PAD | 1 refills | Status: DC
Start: 2017-04-30 — End: 2017-11-04

## 2017-05-01 ENCOUNTER — Ambulatory Visit: Payer: Medicare (Managed Care)

## 2017-05-04 ENCOUNTER — Other Ambulatory Visit: Payer: Self-pay | Admitting: Psychiatry

## 2017-05-05 ENCOUNTER — Other Ambulatory Visit: Payer: Self-pay | Admitting: Primary Care

## 2017-05-05 ENCOUNTER — Other Ambulatory Visit: Payer: Self-pay

## 2017-05-05 ENCOUNTER — Other Ambulatory Visit: Payer: Self-pay | Admitting: Psychiatry

## 2017-05-05 MED ORDER — PANTOPRAZOLE SODIUM 40 MG PO TBEC *I*
40.0000 mg | DELAYED_RELEASE_TABLET | Freq: Every day | ORAL | 5 refills | Status: DC
Start: 2017-05-05 — End: 2017-10-10

## 2017-05-05 NOTE — Telephone Encounter (Signed)
Last appointment: 04/23/2017  Next appointment: 10/20/2017

## 2017-05-07 ENCOUNTER — Encounter: Payer: Self-pay | Admitting: Physical Medicine and Rehabilitation

## 2017-05-08 ENCOUNTER — Other Ambulatory Visit: Payer: Self-pay

## 2017-05-08 MED ORDER — TOPIRAMATE 100 MG PO TABS *I*
100.0000 mg | ORAL_TABLET | Freq: Two times a day (BID) | ORAL | 5 refills | Status: DC
Start: 2017-05-08 — End: 2017-10-29

## 2017-05-09 ENCOUNTER — Encounter: Payer: Self-pay | Admitting: Primary Care

## 2017-05-09 ENCOUNTER — Other Ambulatory Visit: Payer: Self-pay | Admitting: Gastroenterology

## 2017-05-13 ENCOUNTER — Ambulatory Visit
Payer: Medicare (Managed Care) | Attending: Physical Medicine and Rehabilitation | Admitting: Physical Medicine and Rehabilitation

## 2017-05-13 ENCOUNTER — Other Ambulatory Visit: Payer: Self-pay | Admitting: Registered Nurse

## 2017-05-13 ENCOUNTER — Other Ambulatory Visit
Admission: RE | Admit: 2017-05-13 | Discharge: 2017-05-13 | Disposition: A | Discharge status: Home / Self Care | Payer: Medicare (Managed Care) | Source: Ambulatory Visit | Attending: Primary Care | Admitting: Primary Care

## 2017-05-13 ENCOUNTER — Encounter: Payer: Self-pay | Admitting: Physical Medicine and Rehabilitation

## 2017-05-13 VITALS — BP 147/89 | HR 74 | Ht 68.0 in | Wt 282.0 lb

## 2017-05-13 DIAGNOSIS — IMO0002 Reserved for concepts with insufficient information to code with codable children: Secondary | ICD-10-CM

## 2017-05-13 DIAGNOSIS — M545 Low back pain, unspecified: Secondary | ICD-10-CM

## 2017-05-13 DIAGNOSIS — E119 Type 2 diabetes mellitus without complications: Secondary | ICD-10-CM

## 2017-05-13 DIAGNOSIS — M47816 Spondylosis without myelopathy or radiculopathy, lumbar region: Secondary | ICD-10-CM

## 2017-05-13 DIAGNOSIS — E1165 Type 2 diabetes mellitus with hyperglycemia: Secondary | ICD-10-CM | POA: Insufficient documentation

## 2017-05-13 LAB — LIPID PANEL
Chol/HDL Ratio: 4.7
Cholesterol: 160 mg/dL
HDL: 34 mg/dL
LDL Calculated: 79 mg/dL
Non HDL Cholesterol: 126 mg/dL
Triglycerides: 234 mg/dL — AB

## 2017-05-13 LAB — MULTIPLE ORDERING DOCS

## 2017-05-13 NOTE — Progress Notes (Signed)
ID: Kaylee Harris is a 42 y.o. year old  woman with multiple medical problems has had right hip and lower back pain low back pain ongoing for a number of years right hip pain that began several months ago with a fall.  She has some sense that the pain radiates from the back to the hip show some it's pain down the left leg and into the foot.  She's had physical therapy did not help she's taken anti-inflammatories does not help.  She reports no progressive weakness no loss brought bladder control and no saddle anesthesia.    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 Lower abdomen with cellulitis changes, wound probed and purulent material expressed.  Had PICC line for "multiple infiltrations" of what? D/c-ed home on 10d of Augmentin 9/11.      Anginal pain     Anxiety     Arthritis     Asthma     Depression     Diabetes mellitus     Previously on SU and metformin, now diet controlled    Diverticulitis 09/2010    Dysfunctional uterine bleeding     Fibromyalgia     GERD (gastroesophageal reflux disease)     GERD (gastroesophageal reflux disease) 10/04/2016    High blood pressure     Hyperlipidemia     Liver disease     fatty liver     Long term (current) use of insulin, Dermal Adhesed V-Go Insulin Delivery Device 10/04/2016    Migraine     Neuromuscular disorder     POTS (postural orthostatic tachycardia syndrome)     Sebaceous cyst of breast     right axilla    TMJ click, left 1/0/9323    Varicella        Surgical History:  Past Surgical History:   Procedure Laterality Date    APPENDECTOMY  2016    arthroscopic shoulder surgery Right 2010    Related to lifting    CARDIAC CATHETERIZATION  09/2011    negative    COLON SURGERY      DILATION AND CURETTAGE OF UTERUS  2005, 2007    x2 for menorraghia     LEFT COLECTOMY  01/03/14    Dr Tresa Res    loop recorder  Feb 03 2014    loop recorder removal      PR COLONOSCOPY THRU COLOTOMY N/A 02/16/2016    Procedure: COLONOSCOPY;  Surgeon: Kelly Splinter, MD;  Location: [facility];  Service: GI    PR CYSTOURETHROSCOPY,BIOPSY N/A 10/17/2016    Procedure: CYSTOSCOPY BLADDER ;  Surgeon: Ed Blalock, MD;  Location: [place] [facility];  Service: Urology    PR EDG TRANSORAL BIOPSY SINGLE/MULTIPLE N/A 10/29/2016    Procedure: EGD;  Surgeon: Kelly Splinter, MD;  Location: [facility];  Service: GI    TONSILLECTOMY               FTD:DUKGUR of Systems , 10 systems reviewed all normal  pleasant woman awake alert and oriented well-developed well-nourished no acute distress        Allergies:  Allergies   Allergen Reactions    Morphine Itching    Trazodone  Anaphylaxis    Seasonal Allergies Itching    Other [Other] Other (See Comments)     Derma Bond(skin glue) pruritis/redness and c/o respiratory distress.   Received name: Other        Vitals:    05/13/17 1450   BP: 147/89   Pulse: 74   Weight: 127.9 kg (282 lb)   Height: 1.727 m (5\' 8" )     Body mass index is 42.88 kg/m.  Physical Exam:    Observations: as above  Presentation:.presents a timely fashion we'll dress well-groomed significant other attends with her  Affect:.normal  Gait and station:.stable nonantalgic  Postures:.normal    Testing:.They get up and out of a chair independently and without difficulty,  get up and onto an exam table independently and without difficulty.  Straight leg raise is negative.  There is no deformity of the cervical thoracic or lumbar spine to visual or palpatory inspection.  There is no tenderness over the subgluteal or trochanteric bursa. There is no atrophy of either upper or lower extremity.  Hip range of motion does not reproduce  symptoms.    Range of motion:.  forward flex reaching hands to shins    There is no lymphadenopathy in the cervical, inguinal or  axillary regions.  Skin is intact in all four limbs and in the trunk.  Tibial and radial artery pulses are intact bilaterally.    Neuromuscular:Marland Kitchen    Motor Strength:.Strength is normal in the shoulder abductors, elbow flexors, wrist extensors and finger intrinsics bilaterally.  In the lower limbs strength is normal in the hip flexors, knee extensors, ankle dorsiflexors, and in the EHL bilaterally    Sensory Exam:.Sensation is normal to light touch to all dermatomes in the upper and lower extremities bilaterally.    Reflexes:.Reflexes are symmetric and normal at the biceps, triceps, brachioradialis, patellar tendon, and Achilles tendon bilaterally.  Toes are downgoing.  Romberg is negative.    Imaging:.  Lumbar MRI taken last week reveals well-maintained intervertebral disc heights and hydration at all levels mild desiccation at L4-5.  She does have some facet joint effusions bilaterally at L4-5.  There is no focal herniation protrusion or stenosis.  There is no lysis no listhesis.    Other Diagnostics:.    Assessment:.   chronic low back pain.    We discussed options for treatment.    Lisfranc with her in detailing that so in the long run the most helpful thing is for her to carry out regular aerobic exercise low-impact in nature and to begin core strengthening on a daily basis    We might be able to facilitate her therapeutic exercise with facet joint injections she wishes to pursue that.        Plan:.  Rescheduling bilateral L4-5 facet joint injections

## 2017-05-14 ENCOUNTER — Ambulatory Visit: Payer: Medicare (Managed Care) | Attending: Nutrition | Admitting: Registered Nurse

## 2017-05-14 ENCOUNTER — Other Ambulatory Visit: Payer: Self-pay | Admitting: Registered Nurse

## 2017-05-14 ENCOUNTER — Encounter: Payer: Self-pay | Admitting: Registered Nurse

## 2017-05-14 DIAGNOSIS — E119 Type 2 diabetes mellitus without complications: Secondary | ICD-10-CM

## 2017-05-14 DIAGNOSIS — IMO0002 Reserved for concepts with insufficient information to code with codable children: Secondary | ICD-10-CM

## 2017-05-14 DIAGNOSIS — E1165 Type 2 diabetes mellitus with hyperglycemia: Secondary | ICD-10-CM | POA: Insufficient documentation

## 2017-05-14 LAB — HEMOGLOBIN A1C: Hemoglobin A1C: 9.4 % — ABNORMAL HIGH

## 2017-05-14 NOTE — Patient Instructions (Signed)
1.  Bring 3 days of food log & GMs of carb in it      Will use 72hr CGM log sheets    2.  Record bGs,  4 xday, carbs GMs  & food ;  Activity & vgo 40 clicks #  (1 click = 2units)     6 clicks , 09UKR before each meal     3  Remove & mail back in plastic bag log sheets, sensor      Complete omnipod form

## 2017-05-14 NOTE — Progress Notes (Signed)
Kaylee Harris 42 y.o. female was seen today for diabetes education regarding  uncontrolled type 2 diabetes mellitus. Last seen 03/05/2017 for DSMT & continue VGO40 insulin system + Trulicity injection teaching.  individual visits with VGO insuiln patch pump.  Inserted 72hr cgm libre per endo;  Pre pump process started.     Diabetes Meds: reviewed in eRecord; patient is taking as prescribed. Trulicity 1.5 mg q week;Humalog vial in VGO  40 insulin delivery system (1 click = 2 units); recall  6 clicks 3 x day.     Lab Results   Component Value Date    HA1C 9.4 (H) 05/13/2017    HA1C 11.0 (H) 02/17/2017    HA1C 7.4 (H) 05/21/2016    MALBR 0.43 04/23/2017    CREAT 0.77 03/28/2017    LDLC 79 05/13/2017     Self glucose monitoring:  [name]  BG meter 4 times daily. fbs 166; AC  113 - 187; hs 158       BG today:  113 mg/dl        Diet: 3 meals per day ; carb cting per RD   Current Activity  As tolerated  Smoking: no Alcohol: no rare     Self foot check:  self care  Eye Exam inquiry: done    Assessment:   Describes proper skill with filling of VGO device & skin placement on abdomen  Currently using VGO40  MET goal for new soft sites for VGO application  Partner present & both demonstrate proper understanding of basal / bolus system  omnipod pump process started  Was not eligible for CGM libre from [facility] at this time  Will cont  6 clicks (AC B & L + dinner;   Due to mental health, feels cannot participate in group DM education & benefits individual       Coun/Edu:     BARRIERS THAT AFFECT LEARNING:  Mental health ; followed by psych  READINESS TO LEARN:  good  PREFERRED METHODS OF LEARNING:  reading, demonstration and visual tools  EDUCATION PROVIDED: insulin teaching, BG Goals, using ICR and CF to determine meal bolus and future CGM; risk reduction; skin care with VGO    Plan:     Patient Instructions   1.  Bring 3 days of food log & GMs of carb in it      Will use 72hr CGM log sheets    2.  Record bGs,  4 xday, carbs  GMs  & food ;  Activity & vgo 40 clicks #  (1 click = 2units)     6 clicks , 18ACZ before each meal     3  Remove & mail back in plastic bag log sheets, sensor      Complete omnipod form     DSMT SUPPORT PLAN:  Take advantage of your annual Medicare benefits for DSMT and MNT  Follow-up visit:  05/28/17 RD CDE; RN CDE TBS  Time spent counseling patient: 60 minutes  MCR insurance MD follow up referral is in eRecord    Allowed 2 hrs Individual/group DSMT 1  hrs remain; expires 05/05/2018    Valaria Good, RN CDE

## 2017-05-15 ENCOUNTER — Telehealth: Payer: Self-pay

## 2017-05-15 ENCOUNTER — Encounter: Payer: Self-pay | Admitting: Physical Medicine and Rehabilitation

## 2017-05-15 NOTE — Telephone Encounter (Signed)
Pt called to cancel appt of 1/14 with Tresa Res as she needs to stay home that day due to injection.  Pt r/s to: 1/17

## 2017-05-19 ENCOUNTER — Ambulatory Visit: Payer: Medicare (Managed Care) | Admitting: Optometry

## 2017-05-19 ENCOUNTER — Ambulatory Visit
Payer: Medicare (Managed Care) | Attending: Physical Medicine and Rehabilitation | Admitting: Physical Medicine and Rehabilitation

## 2017-05-19 ENCOUNTER — Ambulatory Visit: Payer: Medicare (Managed Care) | Admitting: Psychiatry

## 2017-05-19 ENCOUNTER — Encounter: Payer: Self-pay | Admitting: Physical Medicine and Rehabilitation

## 2017-05-19 ENCOUNTER — Ambulatory Visit
Admission: RE | Admit: 2017-05-19 | Discharge: 2017-05-19 | Disposition: A | Discharge status: Home / Self Care | Payer: Medicare (Managed Care) | Source: Ambulatory Visit | Attending: Physical Medicine and Rehabilitation | Admitting: Physical Medicine and Rehabilitation

## 2017-05-19 ENCOUNTER — Other Ambulatory Visit: Payer: Self-pay | Admitting: Physical Medicine and Rehabilitation

## 2017-05-19 ENCOUNTER — Ambulatory Visit: Payer: Medicare (Managed Care) | Admitting: Physical Medicine and Rehabilitation

## 2017-05-19 ENCOUNTER — Ambulatory Visit: Payer: Medicare (Managed Care)

## 2017-05-19 VITALS — BP 120/85 | HR 79 | Resp 18

## 2017-05-19 DIAGNOSIS — M47816 Spondylosis without myelopathy or radiculopathy, lumbar region: Secondary | ICD-10-CM | POA: Insufficient documentation

## 2017-05-19 DIAGNOSIS — M545 Low back pain, unspecified: Secondary | ICD-10-CM

## 2017-05-19 DIAGNOSIS — M5186 Other intervertebral disc disorders, lumbar region: Secondary | ICD-10-CM | POA: Insufficient documentation

## 2017-05-19 DIAGNOSIS — M5400 Panniculitis affecting regions of neck and back, site unspecified: Secondary | ICD-10-CM | POA: Insufficient documentation

## 2017-05-19 MED ORDER — IOHEXOL 240 MG/ML (OMNIPAQUE) IV SOLN *I*
3.0000 mL | Freq: Once | INTRAMUSCULAR | Status: AC
Start: 2017-05-19 — End: 2017-05-19
  Administered 2017-05-19: 3 mL via EPIDURAL

## 2017-05-19 MED ORDER — LIDOCAINE HCL 1 % IJ SOLN *I*
5.0000 mL | Freq: Once | INTRAMUSCULAR | Status: AC
Start: 2017-05-19 — End: 2017-05-19
  Administered 2017-05-19: 5 mL via SUBCUTANEOUS

## 2017-05-19 MED ORDER — IBUPROFEN 600 MG PO TABS *I*
600.0000 mg | ORAL_TABLET | Freq: Three times a day (TID) | ORAL | 0 refills | Status: AC | PRN
Start: 2017-05-19 — End: 2017-06-18

## 2017-05-19 MED ORDER — DEXAMETHASONE SOD PHOSPHATE PF 10 MG/ML IJ SOLN *I*
12.0000 mg | Freq: Once | INTRAMUSCULAR | Status: AC
Start: 2017-05-19 — End: 2017-05-19
  Administered 2017-05-19: 12 mg via EPIDURAL

## 2017-05-19 MED ORDER — LIDOCAINE HCL 1% IJ SOLN (PF) (AMB) *I*
1.0000 mL | Freq: Once | INTRAMUSCULAR | Status: AC
Start: 2017-05-19 — End: 2017-05-19
  Administered 2017-05-19: 1 mL via EPIDURAL

## 2017-05-19 NOTE — Patient Instructions (Signed)
Post Spinal Injection / Procedure Instructions    Instructions:  Notify the office of the doctor who performed your procedure if you experience any of the following:  · Fever of 100 degrees Fahrenheit or 38 degrees Celsius or greater.  · Excessive swelling or redness at the injection site.  · Excessive pain or numbness that lasts longer than 72 hours after your injections.  · Severe headache that goes away when you lie flat and comes back when you sit upright    Activity:  · Rest after your injection for 24 hours, then return to your usual activities.  This includes any restrictions your physician may have prescribed.  · Do not drive the day of your injection.    Care of the injection site:  · You may ice the injection site for local discomfort.  Cover ice with a cloth and apply light pressure.  Never place ice directly on the skin.   Apply ice for 15 minutes on then off for 1 hour.  Repeat as needed.  · Take band aids off the following day.    For diabetic patients:  · Check your blood sugar level every 8 hours for the next 72 hours.  If it is greater than 250, call your primary care physician.    Medications:  · You may continue taking your pre-injection medications as previously prescribed.  · Remember, the steroid you had injected today usually takes 1 to 3 days to take effect, however it can take up to 7 days.    Contacts/Questions:  In case of an urgent issue or questions:  · Dr. Patel's secretary, Lisa can be reached at [phone]  · Dr. Everett's secretary, Doreen can be reached at [phone]  · Dr. Speach's secretary, Ashley can be reached at [phone]  · Dr. Orsini's secretary, Doreen can be reached at [phone]  · Call the answering service at [phone] after 4:30 PM, on the weekend or a holiday.  Ask for the physician on call.    Follow-up Office Visit:  You need to call the appointment center at [phone] for a follow-up office visit with DR ORSINI in 6 weeks or the  next available appointment.

## 2017-05-19 NOTE — Procedures (Signed)
Pt denies pregnancy, denies pumps or implants, has an inulin dispenser Ve-go LLQ, pt has driver, denies metal implants

## 2017-05-19 NOTE — Procedures (Signed)
Attending Physician:  Dr. 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  The patient was placed prone on the fluoroscopic table and the left L45 facet joint was identified.  This area was then prepped with Betadine X 3 which was allowed to dry. Then using 3 cc of 1% lidocaine the skin and subcutaneous tissue was anesthetized.  Then using a 22 gauge spinal needle the joint space was entered without difficulty.  1/2 cc of Omnipaque 240 was slowly infused at the site showing good spread within the joint space.  Then 2 cc of 1% lidocaine and 8 mg of Dexamethasone Sodium Phosphate was slowly infused at the site.     Then  the right6 L45 facet joint was identified.  This area was then prepped with Betadine X 3 which was allowed to dry. Then using 3 cc of 1% lidocaine the skin and subcutaneous tissue was anesthetized.  Then using a 22 gauge spinal needle the joint space was entered without difficulty.  1/2 cc of Omnipaque 240 was slowly infused at the site showing good spread within the joint space.  Then 2 cc of 1% lidocaine and 8 mg of Dexamethasone Sodium Phosphate was slowly infused at the site.    The patient tolerated the procedure well, no complications were noted.      The patient was observed in the waiting room and after 30 minutes without problems was released home in the care of their driver.     I will see the patient back in 6weeks to review their response to the steroid component.

## 2017-05-22 ENCOUNTER — Ambulatory Visit: Payer: Medicare (Managed Care) | Attending: Psychiatry

## 2017-05-22 DIAGNOSIS — F422 Mixed obsessional thoughts and acts: Secondary | ICD-10-CM | POA: Insufficient documentation

## 2017-05-22 DIAGNOSIS — F603 Borderline personality disorder: Secondary | ICD-10-CM | POA: Insufficient documentation

## 2017-05-22 DIAGNOSIS — F4312 Post-traumatic stress disorder, chronic: Secondary | ICD-10-CM | POA: Insufficient documentation

## 2017-05-22 DIAGNOSIS — F3341 Major depressive disorder, recurrent, in partial remission: Secondary | ICD-10-CM

## 2017-05-22 NOTE — BH Treatment Plan (Addendum)
[facility] Treatment Plan     Date of Plan:   Created/Updated On 05/22/2017   FROM 05/22/2017      Created/Updated On 05/22/2017   Next Treatment Plan Due 08/19/2017       Diagnostic Impression    ICD-10-CM ICD-9-CM   1. 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 Mixed obsessional thoughts and acts F42.2 300.3   3. Borderline personality disorder F60.3 301.83   4. Chronic post-traumatic stress disorder (PTSD) F43.12 309.81       Strengths  Strengths derived from the assessment include: Patient is hard working, has supports.    Problem Areas  *At least one problem must be targeted toward risk reduction if Formulation of Risk or any other previous exam indicated special monitoring or intervention for suicide and/or violence risk indicated.    PROBLEM AREAS (choose and describe relevant):  THOUGHT: Negative  MOOD:Depressed and anxious  BEHAVIOR: Reactive and avoidant  ECONOMIC:SSDI  ________________________________________________________________  Treatment Problem #1 05/22/2017   Patient Identified Problem OCD        Treatment Goal #1 05/22/2017   Patient Identified Goal Work to reduce obsessional thoughts and actions       The rationale for addressing this problem is that resolving it will (select all that apply):  Reduce symptoms of disorder, Reduce functional impairment associated with disorder, Decrease likelihood of hospitalization, Facilitate transfer skills learned in therapy to everday life, Is a key motivational factor for the patient's participation in treatment, Reduce risk for suicide* and Reduce risk for violence*      Progress toward goal(s): Problem resolving. Comment: Patient reports continued stable mood state. Patient continues to work to address chronic medical issues and is following recommendations. Patient has made some attempt to reduce OCD behaviors. Patient continues to endorse PTSD symptoms but has not committed to working on skills to reduce  these. Patient is not on the Suicide Care Pathway.    1 a. Measurable Objectives : Patient will make and keep regular individual therapy appointments.   Date established: 05/31/14   Target date: 08/19/17   Attained or Revised? continue    1 b. Measurable Objectives : Patient will continue to learn and utilize skills to manage affect.   Date established: 05/31/14   Target date: 08/19/17   Attained or Revised? continue    ______________________________________________________________________      Plan  TREATMENT MODALITIES:  Individual psychotherapy for 30-60 min Q 1-3 weeks with Tresa Res, LCSW.  Psychopharmacology visits 20-45 min Q 2-8 weeks with [name], NP.     DISCHARGE CRITERIA for this treatment setting: Patient reports a reduction in symptoms as evidenced by PHQ-9 and GAD-7 scores.    Clinician's name: Tresa Res, LCSW    Psychiatrist's Name: Donny Pique, MD      Patient/Family Statement  PATIENT/FAMILY STATEMENT:  Obtain patient and family input into the treatment plan, including areas of agreement / disagreement.  Obtain patient's signature - if not possible, briefly describe the reason.     Patient Comments:          I HAVE PARTICIPATED IN THE DEVELOPMENT OF THIS TREATMENT PLAN AND I AGREE WITH ITS CONTENTS:       Patient Signature: _______________________________________________________    Date: ______________________________
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
## 2017-05-26 ENCOUNTER — Other Ambulatory Visit: Payer: Self-pay | Admitting: Pulmonary and Critical Care Medicine

## 2017-05-27 MED ORDER — PREGABALIN 150 MG PO CAPS *A*
150.0000 mg | ORAL_CAPSULE | Freq: Two times a day (BID) | ORAL | 0 refills | Status: DC
Start: 2017-05-27 — End: 2017-06-22

## 2017-05-27 NOTE — Telephone Encounter (Signed)
Patient Name: Kaylee Harris Birth Date: 14-Dec-1975   Address: [place], [place] [address] Sex: Female   Rx Written Rx Dispensed Drug Quantity Days Supply Prescriber Name Payment Method Dispenser   05/09/2017 05/12/2017 diphenoxylate-atropine 2.5-0.025 mg tablet  240 30 Lawatsch, Almyra Free A ([employer]) Insurance [place]. #02   04/24/2017 04/24/2017 tramadol hcl 50 mg tablet  42 14 [place], [place]. #02   04/24/2017 04/24/2017 lyrica 150 mg capsule  60 [place], [place]. #02   04/18/2017 04/23/2017 diazepam 5 mg tablet  2 1 Orsini, [place]. #02   04/04/2017 04/04/2017 oxycodone-acetaminophen 5-325 mg tablet  10 5 Ivyland Carrillo, [place]. #02   04/01/2017 04/01/2017 oxycodone hcl 5 mg tablet  6 1 [facility]   04/01/2017 04/01/2017 oxycodone hcl 5 mg capsule  6 1 [place] #02   03/24/2017 03/24/2017 lyrica 150 mg capsule  60 30 [place], [place]. #02   02/20/2017 02/21/2017 lyrica 75 mg capsule  60 30 [name], Darnell Level (MD) [place]. #02   02/18/2017 02/19/2017 clonazepam 0.5 mg tablet  28 28 [name] D NP [employer] #02   01/22/2017 01/22/2017 lyrica 75 mg capsule  60 30 [name], Darnell Level (MD) [place]. #02   01/21/2017 01/21/2017 tramadol hcl 50 mg tablet  42 14 [place], [place]. #02   11/21/2016 11/21/2016 diazepam 5 mg tablet  2 1 [name], Darnell Level (MD) [place]. #02   11/08/2016 11/08/2016 tramadol hcl 50 mg tablet  37 14 [name], Darnell Level (MD) [place]. #02   09/27/2016 10/01/2016 tramadol hcl 50 mg tablet  42 14 [place], [place]. #02   09/17/2016 09/17/2016 tramadol hcl 50 mg tablet  42 14 [place], [place]. #02   08/13/2016 08/19/2016 clonazepam 0.5 mg tablet  30 30 Corigliano, [name] D NP Insurance [place]. #02   06/21/2016 06/21/2016 diphenoxylate-atropine 2.5-0.025 mg tablet  240 30 Mendel Corning ([employer]) Insurance [place]. #02   06/04/2016 06/06/2016 tramadol hcl 50 mg tablet  52 14 [name], G (MD) [place]. #02   * - Drugs marked with an asterisk are compound drugs. If the compound drug is made up of more than one controlled substance, then each controlled substance will be a separate row in the table.      Report Suspicious Activity   Send Questions/Comments   Substance Abuse Treatment Information

## 2017-05-28 ENCOUNTER — Ambulatory Visit: Payer: Medicare (Managed Care) | Attending: Nutrition | Admitting: Nutrition

## 2017-05-28 VITALS — Wt 287.2 lb

## 2017-05-28 DIAGNOSIS — E1165 Type 2 diabetes mellitus with hyperglycemia: Secondary | ICD-10-CM | POA: Insufficient documentation

## 2017-05-28 NOTE — Progress Notes (Signed)
Kaylee Harris is here today for [facility] regarding Insulin requiring Type 2 Diabetes. Referred by: PCP  Here with wife, Kaylee Harris  Needs to learn carb ct    Using VGO insulin delivery but changing to Omnipod + sensor  On Trulicity meds covered by [facility] Readings from Last 3 Encounters:   05/28/17 130.3 kg (287 lb 3.2 oz)   05/13/17 127.9 kg (282 lb)   04/23/17 129.1 kg (284 lb 9.6 oz)     Lab Results   Component Value Date    HA1C 9.4 (H) 05/13/2017    HA1C 11.0 (H) 02/17/2017    HA1C 7.4 (H) 05/21/2016    MALBR 0.43 04/23/2017    CREAT 0.77 03/28/2017    LDLC 79 05/13/2017       Diet: grazing on snacks not going to program anymore; 3 meals close together 11 am 2 pm and 5 pm  Beverages: Powerade Zero; water, coffee, creamer   ETOH: no  Exercise : sedentary  ; dizzy spells  DM Meds: VGOTaking as prescribed.       Meter:  Freestyle lite 3 time daily,  Ac meals    Assessment:   DM-  reviewed CGM report- highest with eve snacks     Obesity  - wt up after holidays; wants to lose; reviewed 1800 cal plan  snack portions today  Meals- are OK but eating too many snacks; reviewed healthier choices;  Exercise very limited    Coun/Edu:     Barriers to learning:  Neuro problems  Education Provided: Plate Method label reading and carb factors    Plan:   Nutrition Goals:   1500 calories  150-180 gm carbs  Weight goal: (-10%) 270 lb  Patient Instructions   Cab goal 180g per day Spread over the day  Not more than 60g per meal    Add more fruit /veg  Include 2-3 hi calcium foods (almond milk, yogurt, low fat cheese )     Limit eve snack to 15g carbs or no snack if pasta meal         Follow-up visit:  Endo + RN   Time spent counseling patient: 60 minutes    [employer] insurance MD follow up referral is in eRecord  .  MNT 1 hrs remain   Expires 05/05/18    Marjo Bicker, RD

## 2017-05-28 NOTE — Patient Instructions (Signed)
Cab goal 180g per day Spread over the day  Not more than 60g per meal    Add more fruit /veg  Include 2-3 hi calcium foods (almond milk, yogurt, low fat cheese )     Limit eve snack to 15g carbs or no snack if pasta meal

## 2017-06-02 ENCOUNTER — Encounter: Payer: Self-pay | Admitting: Registered Nurse

## 2017-06-02 ENCOUNTER — Telehealth: Payer: Self-pay

## 2017-06-02 NOTE — Telephone Encounter (Signed)
Hi Kaylee, Harris is calling to get the status on the Omnipod paperwork. Can you please call her back. Thanks!

## 2017-06-03 NOTE — Progress Notes (Signed)
[facility] - Adult     06/03/2017      Patient Name:  ROMESHA SCHERER  Patient Date of Birth:  30-Mar-1976  Patient Medical Record Number:  [id number]  Patient Phone (Home):  [phone] (home) [phone] (work)  Patient Mobile Phone:    Telephone Information:   Mobile [phone]     Patient Address:  [address]  [place] [address]      Health Screen:    There were no vitals taken for this visit.    Date of Last Physical Exam:  (Not indicated on BMS.)  Name of Primary Care Provider:  [name], MD  Name of OB/GYN: Dr. 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 Lower abdomen with cellulitis changes, wound probed and purulent material expressed.  Had PICC line for "multiple infiltrations" of what? D/c-ed home on 10d of Augmentin 9/11.      Anginal pain     Anxiety     Arthritis     Asthma     Depression     Diabetes mellitus     Previously on SU and metformin, now diet controlled    Diverticulitis 09/2010    Dysfunctional uterine bleeding     Fibromyalgia     GERD (gastroesophageal reflux disease)     GERD (gastroesophageal reflux disease) 10/04/2016    High blood pressure     Hyperlipidemia     Liver disease     fatty liver     Long term (current) use of insulin, Dermal Adhesed V-Go Insulin Delivery Device 10/04/2016    Migraine     Neuromuscular disorder     POTS (postural orthostatic tachycardia syndrome)     Sebaceous cyst of breast     right axilla    TMJ click, left 06/10/2351    Varicella        Obstetrical/Gynecological Screen:    Currently pregnant? : No        Significant pregnancy history?: No     Currently breastfeeding: No    Surgical History:    Past Surgical History:   Procedure Laterality Date    APPENDECTOMY  2016    arthroscopic shoulder surgery Right 2010    Related to lifting    CARDIAC CATHETERIZATION  09/2011     negative    COLON SURGERY      DILATION AND CURETTAGE OF UTERUS  2005, 2007    x2 for menorraghia    LEFT COLECTOMY  01/03/14    Dr Tresa Res    loop recorder  Feb 03 2014    loop recorder removal      PR COLONOSCOPY THRU COLOTOMY N/A 02/16/2016    Procedure: COLONOSCOPY;  Surgeon: Kelly Splinter, MD;  Location: [facility];  Service: GI    PR CYSTOURETHROSCOPY,BIOPSY N/A 10/17/2016    Procedure: CYSTOSCOPY BLADDER ;  Surgeon: Ed Blalock, MD;  Location: [facility];  Service: Urology    PR EDG TRANSORAL BIOPSY SINGLE/MULTIPLE N/A 10/29/2016    Procedure: EGD;  Surgeon: Kelly Splinter, MD;  Location: [facility];  Service: GI    TONSILLECTOMY         Medication History:    Medications/Vitamins/Supplements         Last Dose Start Date End Date Provider  albuterol HFA 108 (90 Base) MCG/ACT inhaler Taking  11/07/16  --  [name], MD     Inhale 1-2 puffs into the lungs every 6 hours as needed for Wheezing   Shake well before each use.     Alcohol Swabs (ALCOHOL PREP) PADS Taking  04/30/17  --  [name], MD     Use BID for BG checkUse BID for BG check     ascorbic acid (VITAMIN C) 100 MG tablet Taking  --  --  [provider]     Take 100 mg by mouth daily     atorvastatin (LIPITOR) 40 MG tablet Taking  11/08/16  --  [name], MD     Take 1 tablet (40 mg total) by mouth daily (with dinner)     blood glucose monitor system Taking  11/15/14  --  Woodward Ku, NP     Brand: cheapest brand available per her insurance.  Use as directed.     busPIRone (BUSPAR) 30 MG tablet Taking  04/24/17  --  Corigliano, Suann Larry, NP     Take 1 tablet (30 mg total) by mouth 2 times daily     cetirizine (ZYRTEC) 10 MG tablet Taking  12/04/16  --  [name], MD     Take 1 tablet (10 mg total) by mouth daily     clonazePAM (KLONOPIN) 0.5 MG tablet Taking  02/18/17  --  Corigliano, Suann Larry, NP     Take 1 tablet (0.5 mg total) by mouth daily as needed   Max daily dose:  0.5 mg     continuous blood glucose monitor (FREESTYLE LIBRE) reader Taking  11/21/16  --  [name], MD     Use with Freestyle Libre CGM sensor     continuous blood glucose monitor (FREESTYLE LIBRE) sensor Taking  11/21/16  --  [name], MD     Apply sensor to back of upper arm. Replace sensor every 10 days.     diazepam (VALIUM) 5 MG tablet Taking  04/18/17  --  Truett Mainland, MD     Take one tablet one hour prior to procedure.  May repeat x1 if needed 15 minutes before procedure.     dulaglutide (TRULICITY) 1.5 VZ/5.6LO injection pen Taking  09/19/16  --  [name], MD     Inject 0.5 mLs (1.5 mg total) into the skin every 7 days     DULoxetine (CYMBALTA) 20 MG DR capsule Taking  03/24/17  --  Corigliano, Suann Larry, NP     Take 1 capsule (20 mg total) by mouth daily     famciclovir (FAMVIR) 500 MG tablet Taking  10/07/16  --  [name], MD     Take 1 tablet (500 mg total) by mouth 3 times daily as needed     fluticasone (FLONASE) 50 MCG/ACT nasal spray Taking  02/28/17  --  [name], MD     1 spray by Nasal route daily     FREESTYLE LITE test strip Taking  11/21/14  --  [name], MD     Use four times daily as directed for 250.02     ibuprofen (ADVIL,MOTRIN) 600 MG tablet   05/19/17  06/18/17  Thompsoncarrillo, Raquel Sarna, MD     Take 1 tablet (600 mg total) by mouth 3 times daily as needed for Pain     Insulin Disposable Pump (V-GO 40) KIT Taking  04/22/17  --  Nada Libman, NP  Use as directed     insulin lispro 100 UNIT/ML injection vial Taking  04/22/17  --  Nada Libman, NP     Inject as directed up to MDD 100 units     insulin syringe-needle U-100 (BD ULTRAFINE) 31G X 5/16" 0.3 ML HALF-UNIT Taking  12/28/15  --  Nelta Numbers, MD     Use 3 times a day as instructed.     lamoTRIgine (LAMICTAL) 150 MG tablet Taking  04/28/17  --  Dirk Dress, NP     Take 1 tablet (150 mg total) by mouth daily     lancets Taking  11/15/14  --  Woodward Ku, NP      Brand Free Style Lite; Use 2 times per day as directed for blood glucose testing.     levonorgestrel (MIRENA) 20 MCG/24HR IUD Taking  --  --  [provider]     1 each by Intrauterine route once     metoprolol (TOPROL-XL) 25 MG 24 hr tablet Taking  05/24/16  --  [provider]     Take by mouth daily        midodrine (PROAMATINE) 10 MG tablet Taking  01/12/16  --  Nelta Numbers, MD     Take 1 tablet (10 mg total) by mouth 3 times daily     mirtazapine (REMERON) 15 MG tablet Taking  05/05/17  --  Garry Heater, NP     TAKE 1 TABLET BY MOUTH NIGHTLY     Non-System Medication Taking  02/15/13  --  Lance Bosch, NP     The above patient is followed in our clinic and cannot resume work permanently.     pantoprazole (PROTONIX) 40 MG EC tablet Taking  05/05/17  --  [name], MD     Take 1 tablet (40 mg total) by mouth daily   SWALLOW WHOLE. DO NOT CRUSH, BREAK, OR CHEW.     phenazopyridine (PYRIDIUM) 100 MG tablet Taking  10/17/16  --  Ed Blalock, MD     Take 1 tablet (100 mg total) by mouth every 8 hours as needed     pregabalin (LYRICA) 150 MG capsule   05/27/17  --  Verita Lamb, NP     Take 1 capsule (150 mg total) by mouth 2 times daily   Max daily dose: 300 mg MAXIMUM DAILY DOSE OF 2 PER DAY     promethazine (PHENERGAN) 12.5 MG tablet Taking  11/07/16  --  [name], MD     Take 1 tablet (12.5 mg total) by mouth 4 times daily as needed     promethazine (PHENERGAN) 25 MG tablet Taking  04/01/17  --  Alphonzo Grieve, MD     Take 1 tablet (25 mg total) by mouth every 4-6 hours as needed for Nausea     SUMAtriptan (IMITREX) 50 MG tablet Taking  04/15/17  --  Verita Lamb, NP     TAKE 1 TABLET BY MOUTH AS NEEDED FOR MIGRAINE, TAKE AT ONSET OF HEADACHE. MAY REPEAT ONCE IN 2 HOURS     SUMAVEL DOSEPRO 6 MG/0.5ML needle-free injection Taking  10/29/16  --  [provider]     INJECT 0.5MLS (6MG TOTAL) UNDER THE SKIN AS NEEDED FOR MIGRAINE MAY REPEAT ONCE  AFTER 1 HOUR IF NEEDED     topiramate (TOPAMAX) 100 MG tablet Taking  05/08/17  --  Verita Lamb, NP     Take 1 tablet (100  mg total) by mouth 2 times daily     TOUJEO SOLOSTAR 300 UNIT/ML SOPN Taking  02/14/17  --  [name], MD     INJECT 30 UNITS INTO THE SKIN NIGHTLY     traMADol (ULTRAM) 50 MG tablet Taking  04/24/17  --  Verita Lamb, NP     Take 1 tablet (50 mg total) by mouth every 8 hours as needed (for head pain and abd pain)   Max daily dose: 629 mg     TRULICITY 1.5 BM/8.4XL injection pen Taking  03/31/17  --  Verita Lamb, NP     INJECT THE CONTENTS OF ONE PEN (0.5ML) INTO THE SKIN EVERY 7 DAYS          Allergies:    Allergy History as of 06/03/17     TRAZODONE       Noted Status Severity Type Reaction    01/08/13 1545 Morris, Arrie Aran 01/08/13 Active High Allergy Anaphylaxis          MORPHINE       Noted Status Severity Type Reaction    03/28/17 2239 Frankey Poot, MD 01/08/13 Active High Allergy Itching    01/08/13 1545 Morris, Arrie Aran 01/08/13 Active High Allergy Anaphylaxis          TRAMADOL       Noted Status Severity Type Reaction    07/06/14 1457 Beatriz Chancellor, RN 07/06/14 Deleted High Allergy Anaphylaxis    07/06/14 1457 Beatriz Chancellor, RN 07/06/14 Active High Allergy Anaphylaxis          KETOROLAC TROMETHAMINE       Noted Status Severity Type Reaction    02/23/17 1425 Dorathy Kinsman, MD 07/06/14 Deleted High Allergy Anaphylaxis    Comments:  Notes feeling tightness in her throat and full body itching after last dose a month ago. Also notes erythema tracking along her vein after injection. This is not reflected by documentation.  Tolerates Naproxen.     07/20/14 1845 Conception Oms, PharmD 07/06/14 Active High Allergy Anaphylaxis    Comments:  Notes feeling tightness in her throat and full body itching after last dose a month ago. Also notes erythema tracking along her vein after injection. This is not reflected by documentation.  Tolerates Naproxen.     07/06/14 Latty, MD 07/06/14 Active High Allergy Anaphylaxis    Comments:  Notes feeling tightness in her throat and full body itching after last dose a month ago. Also notes erythema tracking along her vein after injection. This is not reflected by documentation.     07/06/14 1458 Beatriz Chancellor, RN 07/06/14 Active High Allergy Anaphylaxis          KETOROLAC TROMETHAMINE       Noted Status Severity Type Reaction    10/04/16 1448 Docia Chuck, PA 10/01/14 Deleted Low Allergy Other (See Comments)    Comments:  unknown     10/01/14 1837 Mosko, [place], RN 10/01/14 Active Low Allergy Other (See Comments)    Comments:  unknown           TRAZODONE       Noted Status Severity Type Reaction    10/04/16 1447 Docia Chuck, [place] 10/01/14 Deleted Low Allergy Other (See Comments)    Comments:  unknown     10/01/14 1837 Mosko, [place], RN 10/01/14 Active Low Allergy Other (See Comments)    Comments:  unknown           MORPHINE  Noted Status Severity Type Reaction    10/04/16 1447 Docia Chuck, [place] 10/01/14 Deleted Low Allergy Other (See Comments)    Comments:  unknown     10/01/14 1837 Mosko, [place], RN 10/01/14 Active Low Allergy Other (See Comments)    Comments:  unknown           Other [Other]       Noted Status Severity Type Reaction    10/04/16 1449 Docia Chuck, PA 10/31/15 Active Low  Other (See Comments)    Comments:  Derma Bond(skin glue) pruritis/redness and c/o respiratory distress.   Received name: Other           SEASONAL ALLERGIES       Noted Status Severity Type Reaction    10/04/16 1449 Docia Chuck, PA 10/04/16 Active  Allergy Itching                Nutrition/Hydration Screening:    Weight loss or gain of 10 pounds or more in the past 3 mo.?:  (Not indicated on BMS.)  Does the patient report a recent change in appetite?:  (Not indicated on BMS.)     Any other problems eating or drinking, if so comment.: None reported on BMS    Tobacco Use Screen:    History   Smoking Status    Former Smoker     Packs/day: 0.50    Years: 3.00    Types: Cigarettes   Smokeless Tobacco    Never Used     Comment: quit age late 35s       Pain Assessment:    Do you have any ongoing pain problems?: Yes  Where is your pain located?: Back (Hip)  Where is the pain oriented?:  (Not indicated on BMS.)  How often do you experience the pain?: Continuous  What does the pain feel like?: Stabbing  Pain Scale (0-10): 7  Pain is adequately controlled?: No, referred to PCP    Fall Risk Screen:    Have you fallen in the last year?: Yes  Do you feel you are at risk of falling?: Yes

## 2017-06-05 ENCOUNTER — Encounter: Payer: Self-pay | Admitting: Gastroenterology

## 2017-06-06 ENCOUNTER — Other Ambulatory Visit: Payer: Self-pay | Admitting: Psychiatry

## 2017-06-08 ENCOUNTER — Telehealth: Payer: Self-pay | Admitting: Primary Care

## 2017-06-08 MED ORDER — SULFAMETHOXAZOLE-TRIMETHOPRIM 800-160 MG PO TABS *I*
1.0000 | ORAL_TABLET | Freq: Two times a day (BID) | ORAL | 0 refills | Status: AC
Start: 2017-06-08 — End: 2017-06-18

## 2017-06-08 NOTE — Telephone Encounter (Signed)
[facility] / PRACTICE CALL NOTE    Date and Time of Call: 06/08/2017 12:45pm    PCP:  [name], MD     Problem: cyst on labia    Calling c/o 1d of worsening swelling, pain and drainage from labia, from same site as the cyst/cellulitis that required hospitalization in November. Describes large amount of bloody wet drainage in underwear overnight.     Impression and Plan: 41yof with recurrent labial cyst and suspected infection. Describes purulent drainage. Previously had cellulitis related to this requiring hospitalization in November. Will treat presumptively with bactrim starting today, advised pt to f/u with GYN or me Monday to assure no alternative diagnosis or worsening process in interim.       Staff, please call [facility] and let them know pt has recurrent cyst infection, hospitalized and seen by Dr Donnalee Curry in November. Please ask them for acute appt for pt to be seen Monday or Tuesday.    The patient is instructed to call back if patient is having worsening symptoms or lack of improvement of current symptoms with the above treatment, and patient expressed understanding.    [name], MD

## 2017-06-09 ENCOUNTER — Encounter: Payer: Self-pay | Admitting: Primary Care

## 2017-06-09 ENCOUNTER — Ambulatory Visit: Payer: Medicare (Managed Care) | Attending: Psychiatry | Admitting: Psychiatry

## 2017-06-09 ENCOUNTER — Telehealth: Payer: Self-pay

## 2017-06-09 ENCOUNTER — Ambulatory Visit: Payer: Medicare (Managed Care)

## 2017-06-09 ENCOUNTER — Encounter: Payer: Self-pay | Admitting: Psychiatry

## 2017-06-09 VITALS — BP 128/77 | HR 71 | Resp 18 | Ht 67.99 in | Wt 284.0 lb

## 2017-06-09 DIAGNOSIS — F422 Mixed obsessional thoughts and acts: Secondary | ICD-10-CM

## 2017-06-09 DIAGNOSIS — F3341 Major depressive disorder, recurrent, in partial remission: Secondary | ICD-10-CM | POA: Insufficient documentation

## 2017-06-09 DIAGNOSIS — F4312 Post-traumatic stress disorder, chronic: Secondary | ICD-10-CM

## 2017-06-09 DIAGNOSIS — F603 Borderline personality disorder: Secondary | ICD-10-CM

## 2017-06-09 NOTE — Telephone Encounter (Signed)
Pt had called Dr Laurance Flatten over weekend with c/o labial cyst- was started on Bactrim but Dr Laurance Flatten would like pt to be seen in our office this week. Pt was seen and treated for labial cyst in 04/2017 following an ED visit- we have not seen this pt before or after that .  Will reach out to pt to schedule appt

## 2017-06-09 NOTE — Telephone Encounter (Signed)
Writer called pt to let know provider is out, pt will call back to r/s with [employer]

## 2017-06-09 NOTE — Telephone Encounter (Signed)
Called Dr. Donnalee Curry office and spoke with a nurse, Relayed message below and was told that they are going to contact the patient to schedule an acute appointment. Writer will follow up later today to ensure patient gets scheduled.

## 2017-06-09 NOTE — Progress Notes (Signed)
Behavioral Health Psychopharmacology Follow-up     Length of Session: 25 minutes.    Diagnosis Addressed    ICD-10-CM ICD-9-CM   1. 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 Borderline personality disorder F60.3 301.83   3. Chronic post-traumatic stress disorder (PTSD) F43.12 309.81   4. Mixed obsessional thoughts and acts F42.2 300.3     Chief Complaint:  "The holidays were rough. Too may memories.:"    Recent History and Response to Medications  Patient states: the holidays were difficult with some triggers for her PTSD but that is getting better.  Patient says he believes the Cymbalta helps with the depression but she does not believe it helps with the pain.  The mirtazapine is still only about 5 hours on average despite taking the mirtazapine.  She says she only naps if she has an episode of the POTS.  Patient reports the Buspar helps keep her anxiety down and she has only used the prn of Klonopin about 5 times int he last month.  She believes the Lamictal helps to stabilize her mood.  Patient says she  And her wife had to file for bankruptcy which "was stressful."  Patient says her OCD is "okay."  She has been doing some exposures.      HPI     Follow-up    Additional comments: patient is here to follow up on her medications       Last edited by Shirlee More, LPN on 11/11/4694  2:95 PM. (History)          Current use of alcohol or drugs: No        Neurovegetative Symptoms Review:  Energy level: fair  Concentration: good  Sleep Quality: poor   Number of hours :  (4-6)  Appetite: fair     Wt Readings from Last 3 Encounters:   06/09/17 128.8 kg (284 lb)   05/28/17 130.3 kg (287 lb 3.2 oz)   05/13/17 127.9 kg (282 lb)     Enjoyment/interest: poor    Review of Systems:  Patient says she is waiting to be put on an insulin pump because of her high HgA1c.  The pump was approved and she is waiting for the mail order to get it.  She has another cyst on her labia again and is  back on antibiotics.      Current Medications  Current Outpatient Prescriptions   Medication Sig    sulfamethoxazole-trimethoprim (BACTRIM DS) 800-160 MG per tablet Take 1 tablet by mouth 2 times daily for 10 days    DULoxetine (CYMBALTA) 20 MG DR capsule TAKE 1 CAPSULE BY MOUTH EVERY DAY    pregabalin (LYRICA) 150 MG capsule Take 1 capsule (150 mg total) by mouth 2 times daily   Max daily dose: 300 mg MAXIMUM DAILY DOSE OF 2 PER DAY    ibuprofen (ADVIL,MOTRIN) 600 MG tablet Take 1 tablet (600 mg total) by mouth 3 times daily as needed for Pain    topiramate (TOPAMAX) 100 MG tablet Take 1 tablet (100 mg total) by mouth 2 times daily    mirtazapine (REMERON) 15 MG tablet TAKE 1 TABLET BY MOUTH NIGHTLY    pantoprazole (PROTONIX) 40 MG EC tablet Take 1 tablet (40 mg total) by mouth daily   SWALLOW WHOLE. DO NOT CRUSH, BREAK, OR CHEW.    Alcohol Swabs (ALCOHOL PREP) PADS Use BID for BG checkUse BID for BG check    lamoTRIgine (LAMICTAL) 150 MG tablet  Take 1 tablet (150 mg total) by mouth daily    traMADol (ULTRAM) 50 MG tablet Take 1 tablet (50 mg total) by mouth every 8 hours as needed (for head pain and abd pain)   Max daily dose: 150 mg    busPIRone (BUSPAR) 30 MG tablet Take 1 tablet (30 mg total) by mouth 2 times daily    Insulin Disposable Pump (V-GO 40) KIT Use as directed    insulin lispro 100 UNIT/ML injection vial Inject as directed up to MDD 062 units    TRULICITY 1.5 BJ/6.2GB injection pen INJECT THE CONTENTS OF ONE PEN (0.5ML) INTO THE SKIN EVERY 7 DAYS    fluticasone (FLONASE) 50 MCG/ACT nasal spray 1 spray by Nasal route daily    clonazePAM (KLONOPIN) 0.5 MG tablet Take 1 tablet (0.5 mg total) by mouth daily as needed   Max daily dose: 0.5 mg    cetirizine (ZYRTEC) 10 MG tablet Take 1 tablet (10 mg total) by mouth daily    continuous blood glucose monitor (FREESTYLE LIBRE) reader Use with Freestyle Libre CGM sensor    continuous blood glucose monitor (FREESTYLE LIBRE) sensor Apply  sensor to back of upper arm. Replace sensor every 10 days.    atorvastatin (LIPITOR) 40 MG tablet Take 1 tablet (40 mg total) by mouth daily (with dinner)    dulaglutide (TRULICITY) 1.5 TD/1.7OH injection pen Inject 0.5 mLs (1.5 mg total) into the skin every 7 days    metoprolol (TOPROL-XL) 25 MG 24 hr tablet Take by mouth daily       ascorbic acid (VITAMIN C) 100 MG tablet Take 100 mg by mouth daily    midodrine (PROAMATINE) 10 MG tablet Take 1 tablet (10 mg total) by mouth 3 times daily    insulin syringe-needle U-100 (BD ULTRAFINE) 31G X 5/16" 0.3 ML HALF-UNIT Use 3 times a day as instructed.    FREESTYLE LITE test strip Use four times daily as directed for 250.02    blood glucose monitor system Brand: cheapest brand available per her insurance.  Use as directed.    lancets Brand Free Style Lite; Use 2 times per day as directed for blood glucose testing.    levonorgestrel (MIRENA) 20 MCG/24HR IUD 1 each by Intrauterine route once    Non-System Medication The above patient is followed in our clinic and cannot resume work permanently.    SUMAtriptan (IMITREX) 50 MG tablet TAKE 1 TABLET BY MOUTH AS NEEDED FOR MIGRAINE, TAKE AT ONSET OF HEADACHE. MAY REPEAT ONCE IN 2 HOURS    promethazine (PHENERGAN) 25 MG tablet Take 1 tablet (25 mg total) by mouth every 4-6 hours as needed for Nausea    TOUJEO SOLOSTAR 300 UNIT/ML SOPN INJECT 30 UNITS INTO THE SKIN NIGHTLY    SUMAVEL DOSEPRO 6 MG/0.5ML needle-free injection INJECT 0.5MLS (6MG TOTAL) UNDER THE SKIN AS NEEDED FOR MIGRAINE MAY REPEAT ONCE AFTER 1 HOUR IF NEEDED    albuterol HFA 108 (90 Base) MCG/ACT inhaler Inhale 1-2 puffs into the lungs every 6 hours as needed for Wheezing   Shake well before each use.    phenazopyridine (PYRIDIUM) 100 MG tablet Take 1 tablet (100 mg total) by mouth every 8 hours as needed    famciclovir (FAMVIR) 500 MG tablet Take 1 tablet (500 mg total) by mouth 3 times daily as needed     No current facility-administered  medications for this visit.        Side Effects  Patient Reported Side Effects: None reported  Mental Status  APPEARANCE: Appears younger than stated age, Casual  ATTITUDE TOWARD INTERVIEWER: Cooperative  MOTOR ACTIVITY: WNL (within normal limits)  EYE CONTACT: Direct  SPEECH: Normal rate and tone  AFFECT: Full Range and Appropriate  MOOD: Anxious and Depressed  THOUGHT PROCESS: Normal  THOUGHT CONTENT: No unusual themes  PERCEPTION: Within normal limits  ORIENTATION: Alert and Oriented X 3.  CONCENTRATION: Good  MEMORY:   Recent: intact   Remote: intact  COGNITIVE FUNCTION: Average intelligence  JUDGMENT: Intact  IMPULSE CONTROL: Good and Fair  INSIGHT: Good and Fair    Risk Assessment    Self Injury: Patient Denies  Suicidal Ideation: Patient Denies  Homicidal Ideation: Patient Denies  Aggressive Behavior: Patient Denies    Results  GAD-7 Dates 06/09/2017 05/22/2017 02/24/2017 01/20/2017 12/16/2016 11/04/2016 09/09/2016   Total Score _0 Recent Review Flowsheet Data     PHQ-2/9 Scores 06/09/2017 05/22/2017 04/23/2017 02/24/2017 01/20/2017 12/16/2016 11/04/2016    PSQ2 Q1 - Interest/Pleasure - - N - - - -    PSQ2 Q2 - Down, Depressed, Hopeless - - N - - - -    PHQ9 Q9 - Better Off Dead 0 0 - 0 0 0 0    PHQ9 Calculated Score 9 5 - _1 BP Readings from Last 3 Encounters:   06/09/17 128/77   05/19/17 120/85   05/13/17 147/89       Assessment  FORMULATION:  Patient had had some recent stressors which have contributed to a light increase in symptoms but it is starting to decrease some.  Her current medications are effective in stabilizing her mood and she is using her prn of Klonopin appropriately. We discussed not making any changes in her medications to which she agreed.      Recommendations/Plan and Rationale  PLAN: Continue current medications. Return in 12 weeks. We discussed she can make an earlier appointment if there is any change in her mood.

## 2017-06-09 NOTE — Telephone Encounter (Signed)
Thank you :)

## 2017-06-09 NOTE — Telephone Encounter (Signed)
Spoke with pt, she accepted appt at 2:00 pm 06/12/17 with J Albert,NP- aware she should also schedule a NPV when she comes- she is agreeable-notation in scheduling comment

## 2017-06-10 ENCOUNTER — Other Ambulatory Visit: Payer: Self-pay | Admitting: Primary Care

## 2017-06-10 DIAGNOSIS — Z9109 Other allergy status, other than to drugs and biological substances: Secondary | ICD-10-CM

## 2017-06-10 DIAGNOSIS — E1165 Type 2 diabetes mellitus with hyperglycemia: Secondary | ICD-10-CM

## 2017-06-11 MED ORDER — FREESTYLE LIBRE SENSOR SYSTEM MISC *A*
5 refills | Status: DC
Start: 2017-06-11 — End: 2017-08-19

## 2017-06-11 MED ORDER — FREESTYLE LIBRE READER DEVI *A*
0 refills | Status: DC
Start: 2017-06-11 — End: 2017-08-19

## 2017-06-11 MED ORDER — CETIRIZINE HCL 10 MG PO TABS *I*
10.0000 mg | ORAL_TABLET | Freq: Every day | ORAL | 1 refills | Status: DC
Start: 2017-06-11 — End: 2017-12-07

## 2017-06-12 ENCOUNTER — Ambulatory Visit: Payer: Medicare (Managed Care) | Attending: Obstetrics and Gynecology | Admitting: Obstetrics and Gynecology

## 2017-06-12 VITALS — BP 130/80 | Wt 282.8 lb

## 2017-06-12 DIAGNOSIS — N764 Abscess of vulva: Secondary | ICD-10-CM

## 2017-06-13 ENCOUNTER — Encounter: Payer: Medicare (Managed Care) | Admitting: Psychiatry

## 2017-06-13 ENCOUNTER — Other Ambulatory Visit: Payer: Self-pay | Admitting: Psychiatry

## 2017-06-13 DIAGNOSIS — E1165 Type 2 diabetes mellitus with hyperglycemia: Secondary | ICD-10-CM

## 2017-06-13 NOTE — Progress Notes (Signed)
[facility] Visit  Location: [facility]     Chief Complaint   Patient presents with    Follow-up       Subjective:      Kaylee Harris is 42 y.o. GYN pt of ours who is here for evaluation of possible labial abscess.  Kaylee Harris called her PCP on 06/08/17 after 1 day of swelling/pain/drainage from her labia - at the same site where she developed cellulitis in Nov 2018.  PCP started her on Bactrim and advised her to follow up at this office. She is on day #4/10 of Bactrim course.  Of note, Kaylee Harris is a poorly controlled DM.    Kaylee Harris reports great improvement since starting Bactrim.  She notes that the size has decreased, no more drainage and less tenderness at site.  She reports an episode of chills yesterday, which she says is unusual for her, but she did not check her temperature for fever.  She is afebrile in office today at 98.0.     No LMP recorded (lmp unknown). Patient has had an implant.  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 Lower abdomen with cellulitis changes, wound probed and purulent material expressed.  Had PICC line for "multiple infiltrations" of what? D/c-ed home on 10d of Augmentin 9/11.      Anginal pain     Anxiety     Arthritis     Asthma     Depression     Diabetes mellitus     Previously on SU and metformin, now diet controlled    Diverticulitis 09/2010    Dysfunctional uterine bleeding     Fibromyalgia     GERD (gastroesophageal reflux disease)     GERD (gastroesophageal reflux disease) 10/04/2016    High blood pressure     Hyperlipidemia     Liver disease     fatty liver     Long term (current) use of insulin, Dermal Adhesed V-Go Insulin Delivery Device 10/04/2016    Migraine     Neuromuscular disorder     POTS  (postural orthostatic tachycardia syndrome)     Sebaceous cyst of breast     right axilla    TMJ click, left 11/08/6431    Varicella      Past Surgical History:   Procedure Laterality Date    APPENDECTOMY  2016    arthroscopic shoulder surgery Right 2010    Related to lifting    CARDIAC CATHETERIZATION  09/2011    negative    COLON SURGERY      DILATION AND CURETTAGE OF UTERUS  2005, 2007    x2 for menorraghia    LEFT COLECTOMY  01/03/14    Dr Tresa Res    loop recorder  Feb 03 2014    loop recorder removal      PR COLONOSCOPY THRU COLOTOMY N/A 02/16/2016    Procedure: COLONOSCOPY;  Surgeon: Kelly Splinter, MD;  Location: [place];  Service: GI    PR CYSTOURETHROSCOPY,BIOPSY N/A 10/17/2016    Procedure: CYSTOSCOPY BLADDER ;  Surgeon: Ed Blalock, MD;  Location: [place] [facility];  Service: Urology    PR EDG TRANSORAL BIOPSY SINGLE/MULTIPLE N/A 10/29/2016  Procedure: EGD;  Surgeon: Kelly Splinter, MD;  Location: [place];  Service: GI    TONSILLECTOMY         Social History   Substance Use Topics    Smoking status: Former Smoker     Packs/day: 0.50     Years: 3.00     Types: Cigarettes    Smokeless tobacco: Never Used      Comment: quit age late 32s    Alcohol use 0.0 oz/week      Comment: 0-1 drink/ month     Family History   Problem Relation Age of Onset    Hypertension Father     Diabetes Father     Kidney Disease Father     Elevated lipids Father     Heart attack Father 61    Other Father         PVD    Heart Disease Father     Hypertension Mother     Elevated lipids Mother     Heart attack Mother 22    Diabetes Mother     Eczema Mother     Psoriasis Mother     Heart Disease Mother     Hypertension Brother     Heart Disease Brother 77        prinzmetal's angina    Heart Disease Sister         currently having work up    [place] Sister     Hypertension Sister     Breast cancer Maternal Grandmother     Stroke Other     Cancer Sister      Thyroid disease Sister     Anesth problems Neg Hx          ROS  See HPI for positives.  Denies N/V/C/D/fever/chills.  Denies dysuria, urinary frequency or urgency.  Denies SOB, cough or wheezing.  Denies chest pain or heart palpitations.  Denies pelvic pain.    Objective:     Vitals:    06/12/17 1413   BP: 130/80   Weight: 128.3 kg (282 lb 12.8 oz)    Body mass index is 43.01 kg/m.    Physical Exam  Well developed and well-nourished female.  A&O x3, no acute distress, appropriate mood and affect.  Unlabored respiratory effort.  External genitalia examined and the right labia majora is noted to have a 1cm, indurated, slightly tender mass that is without redness, drainage or visible ceiling.  Upon palpation, this examiner is suspicious for possible sinus tracking from her along her inner groin - no visible surface scaring noted.    Issues Addressed/ Plan    Improving labial abscess with possible component of Hidradenitis?  Will continue with course of Bactrim.  Reviewed s/sx of infection worsening - and pt is strongly encouraged to measure her temperature should she feel chills/feverish.  May use OTC APAP/Ibuprofen as needed for pain mgmt.  Encouraged warm compresses, warm tub soaks with epsom salt and to avoid friction at site.  Will plan for short term follow up in office.  Stressed importance of tight glycemic control.  Pt understands and agrees with plan of care - denies further needs or concerns at this time.    Total visit time is 5min with >50% FTF time.    1. Labial abscess       Return in about 5 days (around 06/17/2017).    Minerva Ends, NP  06/13/17  7:56 AM

## 2017-06-13 NOTE — Progress Notes (Signed)
Continuous Glucose Monitoring Review with LIBRE  #[id number]    Name: Kaylee Harris     Date: January 9-May 18 2017    Diagnosis: Type 2 Diabetes with Hyperglycemia    Medications: VGO 40 Kit  Trulicity 1.5mg weekly    Diet: High fat, some protein unsure of fat percentage, simple carbohydrates. Some vegetables, processed meats    Exercise: none indicated    CGMS reveals: Elevated blood sugars at baseline quite often after dinner and then overnight  BG AVG 186  TIR 70-180 48%  ABOVE 180 52%  BELOW 70 0%    Overall Interpretation: Elevated blood sugars at baseline suggesting she needs more insulin overall than the VGo40 will allow. In the meantime needs better food choices to minimize rise in glucose due to ingestion of simple carbs.    Recommendations:   1. Needs an extra click with bedtime snack and also with higher carb meals  2. Suggest minimize simple carbs and improve snack choices that are higher in protein  3. Suggest ongoing support with MNT  4.Needs more insulin overall, suggest insulin pump versus VGO40 to allow for greater increases in dosing when needed.       , NP     , NP  [facility] Department of Endocrinology  [address], [address]  [place], [place] [address]  Phone [phone]  Fax [phone]

## 2017-06-16 ENCOUNTER — Telehealth: Payer: Self-pay

## 2017-06-16 ENCOUNTER — Encounter: Payer: Self-pay | Admitting: Registered Nurse

## 2017-06-16 NOTE — Telephone Encounter (Signed)
Kaylee Harris called [place] to let us know that we need to send the Omnipod order to [facility]. I looked for it here and in her chart to see if it was scanned but I didn't see it.

## 2017-06-17 ENCOUNTER — Telehealth: Payer: Self-pay

## 2017-06-17 ENCOUNTER — Ambulatory Visit: Payer: Medicare (Managed Care)

## 2017-06-17 NOTE — Telephone Encounter (Signed)
Pt cancelled today's 212 appt due to the weather. Pt rescheduled appt to 2/21. Thank you.

## 2017-06-22 ENCOUNTER — Other Ambulatory Visit: Payer: Self-pay | Admitting: Pulmonary and Critical Care Medicine

## 2017-06-23 ENCOUNTER — Encounter: Payer: Self-pay | Admitting: Obstetrics and Gynecology

## 2017-06-23 ENCOUNTER — Other Ambulatory Visit: Payer: Self-pay

## 2017-06-23 ENCOUNTER — Ambulatory Visit: Payer: Medicare (Managed Care) | Attending: Obstetrics and Gynecology | Admitting: Obstetrics and Gynecology

## 2017-06-23 VITALS — BP 120/70 | Wt 286.6 lb

## 2017-06-23 DIAGNOSIS — N764 Abscess of vulva: Secondary | ICD-10-CM

## 2017-06-23 NOTE — Telephone Encounter (Signed)
Patient Name: Vivianne Carles Birth Date: 1976/01/30   Address: [place], [place] [address] Sex: Female   Rx Written Rx Dispensed Drug Quantity Days Supply Prescriber Name Payment Method Dispenser   05/27/2017 05/29/2017 lyrica 150 mg capsule  60 30 [facility] [place]. #02   05/09/2017 05/12/2017 diphenoxylate-atropine 2.5-0.025 mg tablet  240 30 [facility] ([facility] [employer]. #02   04/24/2017 04/24/2017 tramadol hcl 50 mg tablet  42 14 [place] [place]. #02   04/24/2017 04/24/2017 lyrica 150 mg capsule  60 30 [place], [place]. #02   04/18/2017 04/23/2017 diazepam 5 mg tablet  2 1 [place], [place]. #02   04/04/2017 04/04/2017 oxycodone-acetaminophen 5-325 mg tablet  10 5 Rene Kocher, [place]. #02   04/01/2017 04/01/2017 oxycodone hcl 5 mg tablet  6 1 [facility]   04/01/2017 04/01/2017 oxycodone hcl 5 mg capsule  6 1 [place] #02   03/24/2017 03/24/2017 lyrica 150 mg capsule  60 30 [place] [place]. #02   02/20/2017 02/21/2017 lyrica 75 mg capsule  60 30 [name], Darnell Level (MD) [place]. #02   02/18/2017 02/19/2017 clonazepam 0.5 mg tablet  28 28 Corigliano, [name] D NP [employer] #02   01/22/2017 01/22/2017 lyrica 75 mg capsule  60 30 [name], Darnell Level (MD) [place]. #02   01/21/2017 01/21/2017 tramadol hcl 50 mg tablet  42 14 [place], [place]. #02   11/21/2016 11/21/2016 diazepam 5 mg tablet  2 1 [name], Darnell Level (MD) [place]. #02   11/08/2016 11/08/2016 tramadol hcl 50 mg tablet  42 14 [name], Darnell Level (MD) [place]. #02   09/27/2016 10/01/2016 tramadol hcl 50 mg tablet  42 14 [place], [place]. #02   09/17/2016 09/17/2016 tramadol hcl 50 mg tablet  42 14 [place], [place]. #02   08/13/2016 08/19/2016 clonazepam 0.5 mg tablet  30 30 Corigliano, [name] D NP [employer] [place]. [phone]

## 2017-06-23 NOTE — Progress Notes (Signed)
GYNECOLOGY OFFICE VISIT  CHIEF COMPLAINT: Labial abscess follow-up    HISTORY OF PRESENT ILLNESS  Kaylee Harris presents to clinic today for follow-up of a labial abscess.  She reports that she completed the 10 day course of Bactrim on 2/12. She feels like the area has continued to heal well but notes an area that feels firm like scar tissue. She also notes a new bump that broke open 3 days ago. She denies any current drainage. The area still feels sore to touch. Denies fevers/chills.     She reports that her BGs are 180s - 250s. No recent changes/elevations in her BGs.     She is concerned that she may have Hidradenitis due to recurrent areas of drainage.      REVIEW OF SYSTEMS  General ROS: see above - negative    Patient's medications, allergies, past medical, surgical, obstetrical, gynecologic, social and family histories were reviewed and updated as appropriate.  Problem list reviewed and updated.    MEDICATIONS  Prior to Admission medications    Medication Sig Start Date End Date Taking? Authorizing Provider   continuous blood glucose monitor (FREESTYLE LIBRE) reader Use with Freestyle Libre CGM sensor 06/11/17  Yes Verita Lamb, NP   continuous blood glucose monitor (FREESTYLE LIBRE) sensor Apply sensor to back of upper arm. Replace sensor every 10 days. 06/11/17  Yes Verita Lamb, NP   cetirizine (ZYRTEC) 10 MG tablet Take 1 tablet (10 mg total) by mouth daily 06/11/17  Yes Verita Lamb, NP   DULoxetine (CYMBALTA) 20 MG DR capsule TAKE 1 CAPSULE BY MOUTH EVERY DAY 06/06/17  Yes Lucia Estelle, NP   pregabalin (LYRICA) 150 MG capsule Take 1 capsule (150 mg total) by mouth 2 times daily   Max daily dose: 300 mg MAXIMUM DAILY DOSE OF 2 PER DAY 05/27/17  Yes Verita Lamb, NP   topiramate (TOPAMAX) 100 MG tablet Take 1 tablet (100 mg total) by mouth 2 times daily 05/08/17  Yes Verita Lamb, NP   mirtazapine (REMERON) 15 MG tablet TAKE 1 TABLET BY MOUTH NIGHTLY 05/05/17  Yes Corigliano, Suann Larry, NP   pantoprazole  (PROTONIX) 40 MG EC tablet Take 1 tablet (40 mg total) by mouth daily   SWALLOW WHOLE. DO NOT CRUSH, [place], OR CHEW. 05/05/17  Yes [name], MD   Alcohol Swabs (ALCOHOL PREP) PADS Use BID for BG checkUse BID for BG check 04/30/17  Yes [name], MD   lamoTRIgine (LAMICTAL) 150 MG tablet Take 1 tablet (150 mg total) by mouth daily 04/28/17  Yes Mangarelli, Sonny Masters, NP   traMADol (ULTRAM) 50 MG tablet Take 1 tablet (50 mg total) by mouth every 8 hours as needed (for head pain and abd pain)   Max daily dose: 150 mg 04/24/17  Yes Verita Lamb, NP   busPIRone (BUSPAR) 30 MG tablet Take 1 tablet (30 mg total) by mouth 2 times daily 04/24/17  Yes Corigliano, Suann Larry, NP   Insulin Disposable Pump (V-GO 40) KIT Use as directed 04/22/17  Yes Nada Libman, NP   insulin lispro 100 UNIT/ML injection vial Inject as directed up to MDD 100 units 04/22/17  Yes Nada Libman, NP   SUMAtriptan (IMITREX) 50 MG tablet TAKE 1 TABLET BY MOUTH AS NEEDED FOR MIGRAINE, TAKE AT ONSET OF HEADACHE. MAY REPEAT ONCE IN 2 HOURS 04/15/17  Yes Verita Lamb, NP   promethazine (PHENERGAN) 25 MG tablet Take 1 tablet (25 mg total) by mouth every 4-6 hours as needed for  Nausea 04/01/17  Yes Alphonzo Grieve, MD   TRULICITY 1.5 RU/0.4VW injection pen INJECT THE CONTENTS OF ONE PEN (0.5ML) INTO THE SKIN EVERY 7 DAYS 03/31/17  Yes Verita Lamb, NP   fluticasone [facility]) 50 MCG/ACT nasal spray 1 spray by Nasal route daily 02/28/17  Yes [name], MD   clonazePAM (KLONOPIN) 0.5 MG tablet Take 1 tablet (0.5 mg total) by mouth daily as needed   Max daily dose: 0.5 mg 02/18/17  Yes Corigliano, Suann Larry, NP   TOUJEO SOLOSTAR 300 UNIT/ML SOPN INJECT 30 UNITS INTO THE SKIN NIGHTLY 02/14/17  Yes [name], MD   SUMAVEL DOSEPRO 6 MG/0.5ML needle-free injection INJECT 0.5MLS (6MG TOTAL) UNDER THE SKIN AS NEEDED FOR MIGRAINE MAY REPEAT ONCE AFTER 1 HOUR IF NEEDED 10/29/16  Yes [provider]   atorvastatin  (LIPITOR) 40 MG tablet Take 1 tablet (40 mg total) by mouth daily (with dinner) 11/08/16  Yes [name], MD   albuterol HFA 108 (90 Base) MCG/ACT inhaler Inhale 1-2 puffs into the lungs every 6 hours as needed for Wheezing   Shake well before each use. 11/07/16  Yes [name], MD   phenazopyridine (PYRIDIUM) 100 MG tablet Take 1 tablet (100 mg total) by mouth every 8 hours as needed 10/17/16  Yes Ed Blalock, MD   famciclovir (FAMVIR) 500 MG tablet Take 1 tablet (500 mg total) by mouth 3 times daily as needed 10/07/16  Yes [name], MD   dulaglutide (TRULICITY) 1.5 UJ/8.1XB injection pen Inject 0.5 mLs (1.5 mg total) into the skin every 7 days 09/19/16  Yes [name], MD   metoprolol (TOPROL-XL) 25 MG 24 hr tablet Take by mouth daily    05/24/16  Yes [provider]   ascorbic acid (VITAMIN C) 100 MG tablet Take 100 mg by mouth daily   Yes [provider]   midodrine (PROAMATINE) 10 MG tablet Take 1 tablet (10 mg total) by mouth 3 times daily 01/12/16  Yes Nelta Numbers, MD   insulin syringe-needle U-100 (BD ULTRAFINE) 31G X 5/16" 0.3 ML HALF-UNIT Use 3 times a day as instructed. 12/28/15  Yes Nelta Numbers, MD   FREESTYLE LITE test strip Use four times daily as directed for 250.02 11/21/14  Yes [name], MD   blood glucose monitor system Brand: cheapest brand available per her insurance.  Use as directed. 11/15/14  Yes Woodward Ku, NP   lancets Brand Free Style Lite; Use 2 times per day as directed for blood glucose testing. 11/15/14  Yes Stannard, Hewitt Shorts, NP   levonorgestrel (MIRENA) 20 MCG/24HR IUD 1 each by Intrauterine route once   Yes [provider]   Non-System Medication The above patient is followed in our clinic and cannot resume work permanently. 02/15/13  Yes Lance Bosch, NP       ALLERGIES  Allergies   Allergen Reactions    Morphine Itching    Trazodone Anaphylaxis    Seasonal Allergies Itching    Other [Other] Other (See Comments)      Derma Bond(skin glue) pruritis/redness and c/o respiratory distress.   Received name: Other          PHYSICAL EXAM  Vitals:    06/23/17 1425   BP: 120/70   Weight: 130 kg (286 lb 9.6 oz)       General appearance - alert, well appearing, and in no distress  Mental status - alert, oriented to person, place, and time  Chest -  respirations unlabored, breathing comfortably on room air  External genitalia  - Normal appearing labia. No evidence of erythema. Non-tender to palpation. No open sores. No areas of fluctuance or induration.    ASSESSMENT/PLAN  42 y.o. woman with the following diagnosis(es):  1. Labial abscess     - No areas concerning for infection  - No open areas of drainage  - Labial appear well healed s/p Bactrim  - Low concern for Hidradenitis given current clinical picture    Follow-up PRN    See with Dr Hubbard Robinson, DO  OB/GYN PGY-4  06/23/2017 2:54 PM  Pager [phone]

## 2017-06-25 MED ORDER — PREGABALIN 150 MG PO CAPS *A*
150.0000 mg | ORAL_CAPSULE | Freq: Two times a day (BID) | ORAL | 0 refills | Status: DC
Start: 2017-06-25 — End: 2017-08-14

## 2017-06-26 ENCOUNTER — Encounter: Payer: Self-pay | Admitting: Registered Nurse

## 2017-06-26 ENCOUNTER — Telehealth: Payer: Self-pay

## 2017-06-26 ENCOUNTER — Ambulatory Visit: Payer: Medicare (Managed Care)

## 2017-06-26 ENCOUNTER — Encounter: Payer: Self-pay | Admitting: Primary Care

## 2017-06-26 ENCOUNTER — Other Ambulatory Visit: Payer: Self-pay | Admitting: Obstetrics and Gynecology

## 2017-06-26 MED ORDER — PROMETHAZINE HCL 25 MG PO TABS *I*
25.0000 mg | ORAL_TABLET | ORAL | 3 refills | Status: DC | PRN
Start: 2017-06-26 — End: 2018-03-25

## 2017-06-26 NOTE — Telephone Encounter (Signed)
Last appt: 04/23/2017  Next appt:  10/20/2017    Recent Lab Values 03/31/2017 10/29/2016 10/17/2016 09/25/2016 01/18/2016 01/05/2016 01/04/2016   EXP DATE 04/04/18 04/04/2017 08/03/17 03/2017 - 06/2016 8-19   [facility] - - - - NEG - -

## 2017-06-26 NOTE — Telephone Encounter (Signed)
Pt is sick and cancelled appt with Tresa Res for today and rescheduled on 3/7 at 3:15pm

## 2017-06-27 ENCOUNTER — Telehealth: Payer: Self-pay

## 2017-06-27 ENCOUNTER — Encounter: Payer: Self-pay | Admitting: Primary Care

## 2017-06-27 ENCOUNTER — Ambulatory Visit
Admission: AD | Admit: 2017-06-27 | Discharge: 2017-06-27 | Disposition: A | Discharge status: Home / Self Care | Payer: Medicare (Managed Care) | Source: Ambulatory Visit | Attending: Emergency Medicine | Admitting: Emergency Medicine

## 2017-06-27 DIAGNOSIS — B9789 Other viral agents as the cause of diseases classified elsewhere: Secondary | ICD-10-CM | POA: Insufficient documentation

## 2017-06-27 DIAGNOSIS — J069 Acute upper respiratory infection, unspecified: Secondary | ICD-10-CM | POA: Insufficient documentation

## 2017-06-27 DIAGNOSIS — J45901 Unspecified asthma with (acute) exacerbation: Secondary | ICD-10-CM | POA: Insufficient documentation

## 2017-06-27 MED ORDER — BENZONATATE 200 MG PO CAPS *I*
200.0000 mg | ORAL_CAPSULE | Freq: Three times a day (TID) | ORAL | 0 refills | Status: AC | PRN
Start: 2017-06-27 — End: 2017-07-07

## 2017-06-27 MED ORDER — PREDNISONE 20 MG PO TABS *I*
20.0000 mg | ORAL_TABLET | Freq: Every day | ORAL | 0 refills | Status: AC
Start: 2017-06-27 — End: 2017-07-02

## 2017-06-27 MED ORDER — ALBUTEROL SULFATE (2.5 MG/3ML) 0.083% IN NEBU *I*
2.5000 mg | INHALATION_SOLUTION | Freq: Once | RESPIRATORY_TRACT | Status: AC
Start: 2017-06-27 — End: 2017-06-27
  Administered 2017-06-27: 2.5 mg via RESPIRATORY_TRACT

## 2017-06-27 MED ORDER — PREDNISONE 10 MG PO TABS *I*
40.0000 mg | ORAL_TABLET | Freq: Once | ORAL | Status: AC
Start: 2017-06-27 — End: 2017-06-27
  Administered 2017-06-27: 40 mg via ORAL

## 2017-06-27 MED ORDER — IPRATROPIUM BROMIDE 0.02 % IN SOLN *I*
500.0000 ug | Freq: Once | RESPIRATORY_TRACT | Status: AC
Start: 2017-06-27 — End: 2017-06-27
  Administered 2017-06-27: 500 ug via RESPIRATORY_TRACT

## 2017-06-27 NOTE — Telephone Encounter (Signed)
Pt read mychart message.

## 2017-06-27 NOTE — Telephone Encounter (Signed)
I sent pt mychart response; recommended UC evaluation for possible prednisone    [name], MD  [facility]  06/27/2017  2:48 PM

## 2017-06-27 NOTE — ED Triage Notes (Signed)
Patient states that she has had a cough with chest congestion and green phlegm and shortness of breath for the past 6 days. She does have a history of asthma. She has been using her rescue inhaler with no releif.    Triage Note   Billey Co, RN

## 2017-06-27 NOTE — [facility] Provider Note (Signed)
History     Chief Complaint   Patient presents with    Cough     Patient states that she has had a cough with chest congestion and green phlegm and shortness of breath for the past 6 days. She does have a history of asthma.     This is a 42 year old female with significant past medical history for, type 2 diabetes, hyperlipidemia, GERD presenting with cough and chest tightness.  Patient states that she has a cough for about 6 days.  She states it is productive and she is bringing up green/brown phlegm.  She has been using her albuterol inhaler more than usual.  She states that she last used around 1 PM today.  She's also been taking over-the-counter cold medication with minimal relief.  She denies any fevers, chills, chest pain.            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 Lower abdomen with cellulitis changes, wound probed and purulent material expressed.  Had PICC line for "multiple infiltrations" of what? D/c-ed home on 10d of Augmentin 9/11.      Anginal pain     Anxiety     Arthritis     Asthma     Depression     Diabetes mellitus     Previously on SU and metformin, now diet controlled    Diverticulitis 09/2010    Dysfunctional uterine bleeding     Fibromyalgia     GERD (gastroesophageal reflux disease)     GERD (gastroesophageal reflux disease) 10/04/2016    High blood pressure     Hyperlipidemia     Liver disease     fatty liver     Long term (current) use of insulin, Dermal Adhesed V-Go Insulin Delivery Device 10/04/2016    Migraine     Neuromuscular disorder     POTS (postural orthostatic tachycardia syndrome)     Sebaceous cyst of breast     right axilla    TMJ click, left 01/09/2840    Varicella         Patient Active Problem List   Diagnosis Code    DM (diabetes mellitus), type 2, uncontrolled E11.65    Migraine with aura G43.109    Asthma  J45.909    Hyperlipemia E78.5    Fibromyalgia M79.7    Syncope and collapse- recurrent, working diagnosis vascular vs psychogenic. 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1    Traumatic hemorrhage of right cerebrum Y40.347Q    Diverticulitis K57.92    Long term (current) use of insulin, Dermal Adhesed V-Go Insulin  Delivery Device Z79.4    Morbid obesity with BMI of 40.0-44.9, adult E66.01, Z68.41    Hematuria R31.9    Right hip pain M25.551    Labial abscess N76.4    Labial cyst N90.7    Low back pain M54.5            Past Surgical History:   Procedure Laterality Date    APPENDECTOMY  2016    arthroscopic shoulder surgery Right 2010    Related to lifting    CARDIAC CATHETERIZATION  09/2011    negative    COLON SURGERY      DILATION AND CURETTAGE OF UTERUS  2005, 2007    x2 for menorraghia    LEFT COLECTOMY  01/03/14    Dr Tresa Res    loop recorder  Feb 03 2014    loop recorder removal      PR COLONOSCOPY THRU COLOTOMY N/A 02/16/2016    Procedure: COLONOSCOPY;  Surgeon: Kelly Splinter, MD;  Location: [facility];  Service: GI    PR CYSTOURETHROSCOPY,BIOPSY N/A 10/17/2016    Procedure: CYSTOSCOPY BLADDER ;  Surgeon: Ed Blalock, MD;  Location: [facility];  Service: Urology    PR EDG TRANSORAL BIOPSY SINGLE/MULTIPLE  N/A 10/29/2016    Procedure: EGD;  Surgeon: Kelly Splinter, MD;  Location: [facility];  Service: GI    TONSILLECTOMY       Family History   Problem Relation Age of Onset    Hypertension Father     Diabetes Father     Kidney Disease Father     Elevated lipids Father     Heart attack Father 23    Other Father         PVD    Heart Disease Father     Hypertension Mother     Elevated lipids Mother     Heart attack Mother 9    Diabetes Mother     Eczema Mother     Psoriasis Mother     Heart Disease Mother     Hypertension Brother     Heart Disease Brother 62        prinzmetal's angina    Heart Disease Sister         currently having work up    Peyton Sister     Hypertension Sister     Breast cancer Maternal Grandmother     Stroke Other     Cancer Sister     Thyroid disease Sister     Anesth problems Neg Hx           Social History   Substance Use Topics    Smoking status: Former Smoker     Packs/day: 0.50     Years: 3.00     Types: Cigarettes    Smokeless tobacco: Never Used      Comment: quit age late 21s    Alcohol use 0.0 oz/week      Comment: 0-1 drink/ month     Living Situation     Questions Responses    Patient lives with Spouse    Comment: Lives with Wife, Shari     Homeless No    Caregiver for other family member No    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   HENT: Positive for congestion. Negative for postnasal drip, rhinorrhea, sinus pain, sinus pressure and sore throat.    Respiratory: Positive for cough, chest tightness and shortness of breath.    Cardiovascular: Negative for chest pain.   Gastrointestinal: Negative for diarrhea and vomiting.       Physical Exam   Triage Vitals  Triage Start: Start, (06/27/17 1536)   First Recorded BP: 144/67, Resp: 18, Temp: 36.5 C (97.7 F)  Oxygen Therapy SpO2: 99 %, Heart Rate: 87, (06/27/17 1537)  .      Physical Exam   Constitutional: She appears well-developed and well-nourished.   HENT:   Head: Normocephalic.   Right Ear: Tympanic membrane and ear canal normal.   Left Ear: Tympanic membrane and ear canal normal.   Mouth/Throat: Uvula is midline, oropharynx is clear and moist and mucous membranes are normal. Tonsils are 0 on the right. Tonsils are 0 on the left.   Eyes: Pupils are equal, round, and reactive to light.   Neck: Normal range of motion.   Cardiovascular: Normal rate and regular rhythm.    Pulmonary/Chest: Effort normal. She has wheezes in the right lower field and the left lower field. She has no rhonchi. She has no rales.   Lymphadenopathy:     She has no cervical adenopathy.   Nursing note and vitals reviewed.       Medical Decision Making        Initial Evaluation:  ED First Provider Contact     Date/Time Event User Comments    06/27/17 1534 ED First Provider Contact Benigno Check C Initial Face to Face Provider Contact          Patient was seen on: 06/27/2017        Assessment:  42 y.o.female comes to the [facility] with cough and chest tightness x 6 days. Pt well appearing and VSS. There is some wheezing noted in bilateral lung bases. No clinical suspicion for pneumonia at this time. Will give duoneb and prednisone and reassess.    Differential Diagnosis includes:  Viral Bronchitis  Nasopharyngitis   Rhinosinusitis  Viral URI  Post Nasal Drip  Sinusitis   Strep Pharyngitis  CAP      Plan:   Orders Placed This Encounter    albuterol (PROVENTIL) nebulization 2.5 mg    ipratropium (ATROVENT) 0.02 % nebulizer solution 500 mcg    predniSONE (DELTASONE) tablet 40 mg    benzonatate (TESSALON) 200 MG capsule    predniSONE (DELTASONE) 20 MG tablet       - s/p neb treatment patient feeling better    Final Diagnosis    ICD-10-CM ICD-9-CM   1. Viral URI with cough J06.9 465.9    B97.89    2. Exacerbation of asthma, unspecified  asthma severity, unspecified whether persistent J45.901 493.92     Continue to use the albuterol inhaler every 4-6 hours  Take the prednisone as directed. Start the prescription tomorrow. Monitor your sugars while taking this.  Take the benzonatate three times a day as needed for cough  Keep your head elevated at night. Try warm liquids such as tea with honey and lemon  Follow up as needed      Please start the new medications as below:    Current Discharge Medication List      New Medications    Details Last Dose Given Next Dose Due  Script Given?   benzonatate (TESSALON) 200 mg Dose: 200 mg  Take 200 mg by mouth 3 times daily as needed for Cough    Quantity 30 capsule, Refill 0  Start date: 06/27/2017, End date: 07/07/2017            predniSONE (DELTASONE) 20 mg Dose: 20 mg  Take 20 mg by mouth daily    Quantity 5 tablet, Refill 0  Start date: 06/27/2017, End date: 07/02/2017                   Please follow up with your physician as below:    Follow-up Information     [name], MD.    Specialties:  Primary Care, Family Medicine  Why:  As needed  Contact information:  [address]  [address]   [place] [address]  [phone]                 Thank you Leata Mouse for coming to [facility] for your health care concerns.    If your condition changes and/or worsens please follow up with your primary doctor and/or return to the urgent care center.    If short of breath, chest pains or any other concerns please report to the emergency room.    In the event of an Emergency dial 911.           Final Diagnosis  Final diagnoses:   [J06.9, B97.89] Viral URI with cough (Primary)   [J45.901] Exacerbation of asthma, unspecified asthma severity, unspecified whether persistent         Pearlie Oyster, [place], [place], [place]  06/27/17 [phone]

## 2017-06-27 NOTE — Discharge Instructions (Signed)
Continue to use the albuterol inhaler every 4-6 hours  Take the prednisone as directed. Start the prescription tomorrow. Monitor your sugars while taking this.  Take the benzonatate three times a day as needed for cough  Keep your head elevated at night. Try warm liquids such as tea with honey and lemon  Follow up as needed

## 2017-06-27 NOTE — Telephone Encounter (Signed)
Duration? 1 wk     Fever? No     Recent Infection? On antibiotics, off on the 13th of Feb    Any Sputum? Not always     Color? Green/brown     History of Asthma, COPD OR CHF? Asthma    Chest Pain? No     SOB? Yes, due to congestion. Manageable, but taking inhaler     Have you taken anything for it? Patient has been taking cold and flu medicine over the counter, inhaler, but stated it's not working.

## 2017-06-30 ENCOUNTER — Other Ambulatory Visit: Payer: Self-pay | Admitting: Psychiatry

## 2017-07-02 ENCOUNTER — Encounter: Payer: Self-pay | Admitting: Physical Medicine and Rehabilitation

## 2017-07-02 ENCOUNTER — Ambulatory Visit
Payer: Medicare (Managed Care) | Attending: Physical Medicine and Rehabilitation | Admitting: Physical Medicine and Rehabilitation

## 2017-07-02 VITALS — BP 142/78 | HR 78 | Ht 68.0 in | Wt 281.0 lb

## 2017-07-02 DIAGNOSIS — M47816 Spondylosis without myelopathy or radiculopathy, lumbar region: Secondary | ICD-10-CM

## 2017-07-02 DIAGNOSIS — M542 Cervicalgia: Secondary | ICD-10-CM

## 2017-07-02 NOTE — Progress Notes (Signed)
ID: Kaylee Harris is a 42 y.o. year old  woman with multiple medical problems  CC:  Chief Complaint   Patient presents with    Back Pain     Initial eval December 2018 HPI:    has had right hip and lower back pain low back pain ongoing for a number of years right hip pain that began several months ago with a fall.  She has some sense that the pain radiates from the back to the hip show some it's pain down the left leg and into the foot.  She's had physical therapy did not help she's taken anti-inflammatories does not help.  She reports no progressive weakness no loss brought bladder control and no saddle anesthesia.    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 Lower abdomen with cellulitis changes, wound probed and purulent material expressed.  Had PICC line for "multiple infiltrations" of what? D/c-ed home on 10d of Augmentin 9/11.      Anginal pain     Anxiety     Arthritis     Asthma     Depression     Diabetes mellitus     Previously on SU and metformin, now diet controlled    Diverticulitis 09/2010    Dysfunctional uterine bleeding     Fibromyalgia     GERD (gastroesophageal reflux disease)     GERD (gastroesophageal reflux disease) 10/04/2016    High blood pressure     Hyperlipidemia     Liver disease     fatty liver     Long term (current) use of insulin, Dermal Adhesed V-Go Insulin Delivery Device 10/04/2016    Migraine     Neuromuscular disorder     POTS  (postural orthostatic tachycardia syndrome)     Sebaceous cyst of breast     right axilla    TMJ click, left 01/09/2840    Varicella        Surgical History:  Past Surgical History:   Procedure Laterality Date    APPENDECTOMY  2016    arthroscopic shoulder surgery Right 2010    Related to lifting    CARDIAC CATHETERIZATION  09/2011    negative    COLON SURGERY      DILATION AND CURETTAGE OF UTERUS  2005, 2007    x2 for menorraghia    LEFT COLECTOMY  01/03/14    Dr Tresa Res    loop recorder  Feb 03 2014    loop recorder removal      PR COLONOSCOPY THRU COLOTOMY N/A 02/16/2016    Procedure: COLONOSCOPY;  Surgeon: Kelly Splinter, MD;  Location: [facility];  Service: GI    PR CYSTOURETHROSCOPY,BIOPSY N/A 10/17/2016    Procedure: CYSTOSCOPY BLADDER ;  Surgeon: Ed Blalock, MD;  Location: [facility];  Service: Urology    PR EDG TRANSORAL BIOPSY SINGLE/MULTIPLE N/A 10/29/2016  Procedure: EGD;  Surgeon: Kelly Splinter, MD;  Location: [facility];  Service: GI    TONSILLECTOMY               SWF:UXNATF of Systems , 10 systems reviewed all normal  pleasant woman awake alert and oriented well-developed well-nourished no acute distress        Allergies:  Allergies   Allergen Reactions    Morphine Itching    Trazodone Anaphylaxis    Seasonal Allergies Itching    Other [Other] Other (See Comments)     Derma Bond(skin glue) pruritis/redness and c/o respiratory distress.   Received name: Other        Vitals:    07/02/17 1459   BP: 142/78   Pulse: 78   Weight: 127.5 kg (281 lb)   Height: 1.727 m (5\' 8" )     Body mass index is 42.73 kg/m.  Physical Exam:    Observations: as above  Presentation:.presents a timely fashion we'll dress well-groomed significant other attends with her  Affect:.normal  Gait and station:.stable nonantalgic  Postures:.normal    Testing:.They get up and out of a chair independently and without difficulty,  get up and onto an exam table independently and  without difficulty.  Straight leg raise is negative.  There is no deformity of the cervical thoracic or lumbar spine to visual or palpatory inspection.  There is no tenderness over the subgluteal or trochanteric bursa. There is no atrophy of either upper or lower extremity.  Hip range of motion does not reproduce  symptoms.      Shoulder range of motion motion does not reproduce her symptoms in the subscapular region    Cervical range of motion does reproduce some subscapular symptoms.  Range of motion:.  forward flex reaching hands to shins    There is no lymphadenopathy in the cervical, inguinal or axillary regions.  Skin is intact in all four limbs and in the trunk.  Tibial and radial artery pulses are intact bilaterally.    Neuromuscular:Marland Kitchen    Motor Strength:.Strength is normal in the shoulder abductors, elbow flexors, wrist extensors and finger intrinsics bilaterally.  In the lower limbs strength is normal in the hip flexors, knee extensors, ankle dorsiflexors, and in the EHL bilaterally    Sensory Exam:.Sensation is normal to light touch to all dermatomes in the upper and lower extremities bilaterally.    Reflexes:.Reflexes are symmetric and normal at the biceps, triceps, brachioradialis, patellar tendon, and Achilles tendon bilaterally.  Toes are downgoing.  Romberg is negative.    Imaging:.  MRI noted as above L4-5 facet joint effusion bilaterally    Other Diagnostics:.    Assessment:.    1.  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  She's going to begin physical therapy for cervicalgia referred to the shoulder    If no significant improvement additional imaging of the cervical spine will be indicated.  Plan:.  imaging is pending including x-ray and MRI she'll follow-up  afterwards

## 2017-07-03 ENCOUNTER — Encounter: Payer: Self-pay | Admitting: Primary Care

## 2017-07-03 ENCOUNTER — Encounter: Payer: Self-pay | Admitting: Physical Medicine and Rehabilitation

## 2017-07-03 DIAGNOSIS — M47816 Spondylosis without myelopathy or radiculopathy, lumbar region: Secondary | ICD-10-CM | POA: Insufficient documentation

## 2017-07-05 ENCOUNTER — Other Ambulatory Visit: Payer: Self-pay | Admitting: Psychiatry

## 2017-07-08 ENCOUNTER — Ambulatory Visit
Payer: Medicare (Managed Care) | Attending: Physical Medicine and Rehabilitation | Admitting: Rehabilitative and Restorative Service Providers"

## 2017-07-08 DIAGNOSIS — M5412 Radiculopathy, cervical region: Secondary | ICD-10-CM | POA: Insufficient documentation

## 2017-07-08 NOTE — Telephone Encounter (Signed)
Gay Filler,    Can you email me the form and I will have Colletta Maryland fill it out tomorrow?    Thanks,  Mickel Baas

## 2017-07-08 NOTE — Progress Notes (Addendum)
Sent via: eRecord EMR    Physician attestation for [facility] Plan of Care: Physician:  Truett Mainland  Per signature, I have reviewed and agree with the documented plan of care.    ____________________________________________________________         CERVICAL THORACIC SPINE EVALUATION   Subjective:  Kaylee Harris is a 42 y.o. female who is present today for cervical  pain.  Mechanism of injury/history of symptoms: hx of falls from recently diagnosed POTS. She reports hx of neck pain since  approx November 2018 Pain is central from neck and includes the sides of her neck. She is also have R scapular pain and discomfort.     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   Sport/Activities: NONE   Diagnostic tests: None   Other: NA    Symptom location: Central  right and scapula   Relevant symptoms: Aching, Pain , Decreased ROM, Decreased strength   Symptom frequency: Constant  Symptom intensity (0 - 10 scale): Now 4 Best 2 Worst 7   Night Pain: Yes and she is a prone sleeper      Symptoms worsen with: Sitting, prolonged positioning;    Symptoms improve with: None/ Nothing   Assistive device:  none  Patients goals for therapy: Reduce pain, Increase ROM / flexibility, Increase strength, Achieve independence with home program for self care / condition management  Objective:  Observation: Patient was able to ambulate into the department with a normal gait pattern.  The patient was able to perform basic bed mobility independent.    Movement Screen: NT    Palpation:  TTP C7-8 spinous processes    ROM Loss    Flexion: minor loss, moderate loss  Extension:  moderate loss  Left Sidebend: minor loss, moderate loss  Right Sidebend:  minor loss, moderate loss  Left Rotation:  minor loss  Right Rotation:  minor loss  Repeated Movement Testing:  NA   Neuromuscular Assessment: Myotomes:  UE Intact to gross screen    Test:   Lumbar:  NA               Cervical Spine:  Distraction, positive  Vertebral Artery,  negative    Sacroilliac Joint:  NA       CERVICAL/THORACIC SCREEN    McGill Stage:  deferred  Postural correction test:    Sitting Correction: symptoms reduced:  no  Standing Correction: symptoms reduced:  no    Directional Preference:  Neutral    Deep Neck Flexor Endurance Test:  NT,     Trunk and Neck Flexion Test:  deferrred    Dynamic Stability Test:      SPINE ASSESSMENT    Assessment: consistent with 42 y.o., female with   Encounter Diagnosis   Name Primary?    Cervical radiculopathy Yes   .  with pain, ROM limitations, strength limitations, functional limitations, from a series of falls due to POTS which was recently diagnosed. She has had neck pain since Novemeber 2018, She is now medicated for her POTS, and has not had an episode in 4 months.   I was unsuccessful getting a subjective functional scale due to patients functional limitations being unrelated to her diagnosis. Will continue to evaluate function regardless.     Prognosis:  Good      Contraindications/Precautions/Limitation:  Per protocol and Per diagnosis     Short Term  Goals:  Decrease c/o max pain to < 4/10, Increase ROM to min restractions all planes of motion, Increase strength cervical progression from static to more dynamic stabilization exercises and Minimal assistance with HEP/ education concepts ; patient will be able to report a 40-50% improvement in pain response to ADLs  Long Term Goals:  Walk community distances without increased symptoms, Stand as long as needed when completing ADL's without increased symptoms, Sit as long as needed with ADL's without increased symptoms, Ambulate stairs up and down without increased symptoms, Drive automobile without increased symptoms, Transfer from sit to stand and perform bed mobility without increased symptoms, Patient demonstrates improved functional core strength, Patient will return to full prior  level of function  , Independent with symptom management ; patient will subjectively report symptom provocation improved to only 10% or less of her day    Treatment Plan:     Patient/family involved in developing goals and treatment plan: Yes  Freq 1-2 times per week(s) for 3 month(s)    Treatment plan inclusive of:   Exercise: Stretching, Strengthening, Progressive Resistive, Aerobic exercise    Manual Techniques:  Myofascial Release, Joint mobilization, Soft tissue release/massage    Modalities:  Functional/Therapeutic activites per flowsheet, Interferential, Joint Mobilizations, Manual therapy,  Joint Mobilization, Soft tissue Mobilization / Massage, Massage, Moist heat, Neuromuscular Re-ed,  Activity guidelines, Ther Exercise per flowsheet,     Functional: Proprioception/Dynamic stability, Functional rehab, Postural training, Self-care, Home management    Patient was educated on how the principles of the Active Spine Program will benefit their condition.  Patient was educated on the benefits of maintaining an active lifestyle. The patient was instructed on core strengthening exercises for spine support and stability; flexibility exercise for the upper and lower extremities to reduce movement stress to the spine; and aerobic exercise for weight management and endurance.  The patient was also educated on proper posture and body mechanics to avoid painful movements and to work on any directional preference positions.  Modalities for pain control, and traction will be used as necessary to benefit the patients symptoms.        Patient was instructed how to gradually progress their exercise routine within their range of tolerable symptoms.  Patient was advised that maintenance and consistency with the exercise routine at home is key to success.  And that it is critical that they notify their therapist of any prescribed activity that increases their symptoms so that the routine may be modified and progressed.       Thank you for the referral of this patient to the [facility], PT        Mechanical cervical traction Intermittent cervical traction 20' x 10' intermittent (60/20)       Cervical isom with ball on wall 5" x 5   UT str 15' x 2                                TIME + FUNCTIONAL REPORTING SHEET    Treatment Start Time 230   Treatment End Time 120   Total Minutes of treatment 50       Total Non-Treatment time(rest, etc)    Total Service Based Min. of Treatment 35   Total Time-Based minutes of treatment 10           Service-Based Procedures/ Modalities    Evaluation 35   Re-evaluation  Traction, mechanical    Electric stimulation (unattended)    Whirlpool    Vasopneumatic device    Total service-based billable procedures 35       Time-Based  Procedures / Modalities    Electric stimulation (manual)    Iontophoresis    Contrast baths    Ultrasound    Physical Performance Test    Therapeutic ex 10   Neuromuscular reed    Gait training, including stairs    Manual Therapy    Therapeutic Activities    Total Time-Based Billable Procedures 10            POC DATE: 07/08/17

## 2017-07-09 ENCOUNTER — Encounter: Payer: Self-pay | Admitting: Podiatry

## 2017-07-09 ENCOUNTER — Ambulatory Visit: Payer: Medicare (Managed Care) | Attending: Primary Care | Admitting: Podiatry

## 2017-07-09 VITALS — Ht 68.0 in | Wt 283.0 lb

## 2017-07-09 DIAGNOSIS — L600 Ingrowing nail: Secondary | ICD-10-CM

## 2017-07-09 DIAGNOSIS — M79671 Pain in right foot: Secondary | ICD-10-CM

## 2017-07-09 DIAGNOSIS — Z794 Long term (current) use of insulin: Secondary | ICD-10-CM

## 2017-07-09 DIAGNOSIS — E119 Type 2 diabetes mellitus without complications: Secondary | ICD-10-CM

## 2017-07-09 LAB — HM DIABETES FOOT EXAM

## 2017-07-09 NOTE — Progress Notes (Signed)
Subjective:       Patient presents to clinic for a painful ingrown right first toenail.  Patient states that she had   the nail borders of both first toenails removed when she was living in [place].  She states that the left first toenail healed without incident however the medial border of the right first toenail regrew.  She states the nail border is painful with pressure.  She denies any signs of infection to the toe.  Patient is a type II diabetic and denies any numbness, tingling, or burning in her feet and legs.  Her most recent A1c was 10.7.    Patient ID: Kaylee Harris is a 42 y.o. female.  Patient Active Problem List   Diagnosis Code    DM (diabetes mellitus), type 2, uncontrolled E11.65    Migraine with aura G43.109    Asthma J45.909    Hyperlipemia E78.5    Fibromyalgia M79.7    Syncope and collapse- recurrent, working diagnosis vascular vs psychogenic. 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1    Traumatic hemorrhage of right cerebrum S06.349A    Diverticulitis K57.92    Long term (current) use of insulin, Dermal Adhesed V-Go Insulin Delivery Device Z79.4    Morbid obesity with BMI of 40.0-44.9, adult E66.01, Z68.41    Hematuria R31.9    Right hip pain M25.551    Labial abscess N76.4    Labial cyst N90.7    Low back pain M54.5     Lumbar facet arthropathy M47.816     Current Outpatient Prescriptions   Medication    DULoxetine (CYMBALTA) 20 MG DR capsule    promethazine (PHENERGAN) 25 MG tablet    pregabalin (LYRICA) 150 MG capsule    continuous blood glucose monitor (FREESTYLE LIBRE) reader    continuous blood glucose monitor (FREESTYLE LIBRE) sensor    cetirizine (ZYRTEC) 10 MG tablet    topiramate (TOPAMAX) 100 MG tablet    mirtazapine (REMERON) 15 MG tablet    pantoprazole (PROTONIX) 40 MG EC tablet    Alcohol Swabs (ALCOHOL PREP) PADS    lamoTRIgine (LAMICTAL) 150 MG tablet    traMADol (ULTRAM) 50 MG tablet    busPIRone (BUSPAR) 30 MG tablet    Insulin Disposable Pump (V-GO 40) KIT    insulin lispro 100 UNIT/ML injection vial    SUMAtriptan (IMITREX) 50 MG tablet    TRULICITY 1.5 HC/6.2BJ injection pen    fluticasone (FLONASE) 50 MCG/ACT nasal spray    clonazePAM (KLONOPIN) 0.5 MG tablet    TOUJEO SOLOSTAR 300 UNIT/ML SOPN    SUMAVEL DOSEPRO 6 MG/0.5ML needle-free injection    atorvastatin (LIPITOR) 40 MG tablet    albuterol HFA 108 (90 Base) MCG/ACT inhaler  phenazopyridine (PYRIDIUM) 100 MG tablet    famciclovir (FAMVIR) 500 MG tablet    dulaglutide (TRULICITY) 1.5 DJ/4.9FW injection pen    metoprolol (TOPROL-XL) 25 MG 24 hr tablet    ascorbic acid (VITAMIN C) 100 MG tablet    midodrine (PROAMATINE) 10 MG tablet    insulin syringe-needle U-100 (BD ULTRAFINE) 31G X 5/16" 0.3 ML HALF-UNIT    FREESTYLE LITE test strip    blood glucose monitor system    lancets    levonorgestrel (MIRENA) 20 MCG/24HR IUD    Non-System Medication     No current facility-administered medications for this visit.      Allergy History as of 07/09/17     TRAZODONE       Noted Status Severity Type Reaction    01/08/13 1545 Morris, Arrie Aran 01/08/13 Active High Allergy Anaphylaxis          MORPHINE       Noted Status Severity Type Reaction    03/28/17 2239 Frankey Poot, MD 01/08/13 Active High Allergy Itching     01/08/13 1545 Morris, Arrie Aran 01/08/13 Active High Allergy Anaphylaxis          TRAMADOL       Noted Status Severity Type Reaction    07/06/14 1457 Beatriz Chancellor, RN 07/06/14 Deleted High Allergy Anaphylaxis    07/06/14 1457 Beatriz Chancellor, RN 07/06/14 Active High Allergy Anaphylaxis          KETOROLAC TROMETHAMINE       Noted Status Severity Type Reaction    02/23/17 1425 Dorathy Kinsman, MD 07/06/14 Deleted High Allergy Anaphylaxis    Comments:  Notes feeling tightness in her throat and full body itching after last dose a month ago. Also notes erythema tracking along her vein after injection. This is not reflected by documentation.  Tolerates Naproxen.     07/20/14 1845 Conception Oms, PharmD 07/06/14 Active High Allergy Anaphylaxis    Comments:  Notes feeling tightness in her throat and full body itching after last dose a month ago. Also notes erythema tracking along her vein after injection. This is not reflected by documentation.  Tolerates Naproxen.     07/06/14 [place], MD 07/06/14 Active High Allergy Anaphylaxis    Comments:  Notes feeling tightness in her throat and full body itching after last dose a month ago. Also notes erythema tracking along her vein after injection. This is not reflected by documentation.     07/06/14 1458 Beatriz Chancellor, RN 07/06/14 Active High Allergy Anaphylaxis          KETOROLAC TROMETHAMINE       Noted Status Severity Type Reaction    10/04/16 1448 Docia Chuck, PA 10/01/14 Deleted Low Allergy Other (See Comments)    Comments:  unknown     10/01/14 1837 Mosko, [place], RN 10/01/14 Active Low Allergy Other (See Comments)    Comments:  unknown           TRAZODONE       Noted Status Severity Type Reaction    10/04/16 1447 Docia Chuck, [place] 10/01/14 Deleted Low Allergy Other (See Comments)    Comments:  unknown     10/01/14 1837 Mosko, [place], RN 10/01/14 Active Low Allergy Other (See Comments)    Comments:  unknown           MORPHINE       Noted Status Severity  Type Reaction    10/04/16 1447 Docia Chuck, PA 10/01/14 Deleted Low Allergy Other (  See Comments)    Comments:  unknown     10/01/14 1837 Mosko, [place], RN 10/01/14 Active Low Allergy Other (See Comments)    Comments:  unknown           Other [Other]       Noted Status Severity Type Reaction    10/04/16 1449 Docia Chuck, PA 10/31/15 Active Low  Other (See Comments)    Comments:  Derma Bond(skin glue) pruritis/redness and c/o respiratory distress.   Received name: Other           SEASONAL ALLERGIES       Noted Status Severity Type Reaction    10/04/16 1449 Docia Chuck, PA 10/04/16 Active  Allergy Itching                HPI  Patient's medications, allergies, past medical, surgical, social and family histories were reviewed and updated as appropriate.    Review of Systems        Objective:   Physical Exam    Vascular:  Dorsalis pedis pulses are 2+ (normal)/4 bilateral and posterior tibial pulses are 2+ (normal)/4 bilateral.  Capillary refill time is less than 3 seconds to all toes.  Skin temperature is warm to warm from proximal to distal.  Pedal hair growth is present.      Musculoskeletal:  Pain with palpation of medial border of the right first toenail.    Dermatological:  Toe nails 1 through 5 bilateral are thickened, dystrophic, and mycotic with subungual debris.  Web spaces 1 through 4 bilateral are dry and intact. No open lesions or excoriations are noted.  Medial border of the right first toenail is incurvated into the adjacent nail fold.  Longitudinal split noted to the medial border of the right first toenail.  No signs of infection noted.    Neurological:  Vibratory sensation is intact to the hallux IPJ bilaterally.  Protective sensation is intact to 10 out of 10 sites bilateral.        Assessment:         1. Ingrowing right great toenail     2. Pain in right foot     3. Type 2 diabetes mellitus without complication, with long-term current use of insulin         Plan:      1.    Patient was examined  and evaluated for painful ingrown medial border of the right first toenail.  She was verbally consented for a partial nail avulsion with phenol matrixectomy on today's visit.  Patient was dispensed soaking and bandaging instructions.  2.  Follow-up 4 weeks      Procedure:    1.  A time out was performed.  Patient was examined and evaluated for a paronychia of the medial borders of the right first toe.  Consent was obtained for a partial nail avulsion of the medial borders of the right first toe.  The toe was prepped with isopropyl alcohol and ethyl chloride and anesthetized with approximately 3 mL of 2% lidocaine plain.  Next a partial nail avulsion was performed to the medial borders of the right first toe.  The nail border was treated with 3 applications of phenol and flushed with copious amounts of isopropyl alcohol.  The toe was dressed with a compressive dressing with bacitracin.  Patient was dispensed soaking and dressing instructions and advised to call the office if they notice increased signs of infection.    PROCEDURE DOCUMENTATION:    Mycotic Nail Debridement  Indications/Consent:  Secondary to increased risk factors the patient requires mycotic nail debridement in length and thickness to reduce the risk of injury, infection, or ulceration.  Verbal consent was obtained.  Procedural timeout was taken.    Procedure:  Mycotic nails were debrided in length and thickness sharply with a nail nipper x 10 to include toenails 1-5 left foot and 1-5 right foot without incident secondary to high risk status to reduce risk of injury and infection. The patient tolerated procedure well.

## 2017-07-09 NOTE — Patient Instructions (Signed)
Soaking Instructions    1.  You will need to scrub the area daily with antibacterial soap and warm water using a washcloth.     2. You will need to soak twice daily in a basin of warm water with 1/4 - 1/2 cup of white vinegar or Epsom salts for 10-20  minutes for 1-2 weeks (until drainage stops)    3. Be sure to dry the area well and then apply a small amount of antibiotic ointment and gauze or a band-aid for 1-2 weeks (until drainage stops)     4. The area will get red and drain.  This is normal.  The area needs to drain and this may last a few weeks.

## 2017-07-10 ENCOUNTER — Ambulatory Visit: Payer: Medicare (Managed Care) | Attending: Psychiatry

## 2017-07-10 DIAGNOSIS — F422 Mixed obsessional thoughts and acts: Secondary | ICD-10-CM | POA: Insufficient documentation

## 2017-07-10 DIAGNOSIS — F3341 Major depressive disorder, recurrent, in partial remission: Secondary | ICD-10-CM | POA: Insufficient documentation

## 2017-07-10 DIAGNOSIS — F603 Borderline personality disorder: Secondary | ICD-10-CM | POA: Insufficient documentation

## 2017-07-10 DIAGNOSIS — F4312 Post-traumatic stress disorder, chronic: Secondary | ICD-10-CM | POA: Insufficient documentation

## 2017-07-10 NOTE — Progress Notes (Signed)
Behavioral Health Progress Note     Length of Session: 45 minutes    Contact Type:  Location: On Site    Face to Face     Problem(s)/Goals Addressed from Treatment Plan:    Problem 1:   Treatment Problem #1 05/22/2017   Patient Identified Problem OCD       Goal for this problem:    Treatment Goal #1 05/22/2017   Patient Identified Goal Work to reduce obsessional thoughts and actions       Progress towards this goal: Patient reports some difficult with emotional boundaries.    Mental Status Exam:  APPEARANCE: Appears stated age, Well-groomed, Casual  ATTITUDE TOWARD INTERVIEWER: Cooperative  MOTOR ACTIVITY: WNL (within normal limits)  EYE CONTACT: Direct and Indirect  SPEECH: Normal rate and tone  AFFECT: Full Range  MOOD: Lively, Neutral and Sad  THOUGHT PROCESS: Normal and Circumstantial  THOUGHT CONTENT: No unusual themes and Negative Rumination  PERCEPTION: No evidence of hallucinations  CURRENT SUICIDAL IDEATION: patient denies  CURRENT HOMICIDAL IDEATION: Patient denies  ORIENTATION: Alert and Oriented X 3.  CONCENTRATION: Good  MEMORY:   Recent: intact   Remote: intact  COGNITIVE FUNCTION: Average intelligence  JUDGMENT: Intact  IMPULSE CONTROL: Fair  INSIGHT: Fair    Risk Assessment:  ASSESSMENT OF RISK FOR SUICIDAL BEHAVIOR  Changes in risk for suicide from baseline Formulation of Risk and/or previous intake, including newly identified risk, if any: none  Violence risk was assessed and No Change noted from baseline formulation of risk and/or previous assessment.    Session Content:: Patient reports some difficult with emotional boundaries. Patient talked about a recent fight with her wife about transportation. She said that her wife hates to drive in winter and this made her feel bad about herself. Writer provided empathetic support. Writer and patient discussed ways in which patient can problem solve this issues, e.g. Set up transportation. Writer suggested couples' counseling again and patient said that  her wife would not do this. Patient said that she is trying to eat healthier and succeeds most of the time, but her wife does not. Writer commended patient for working to meet her goals.      Visit Diagnosis:      ICD-10-CM ICD-9-CM   1. Mixed obsessional thoughts and acts F42.2 300.3   2. Borderline personality disorder F60.3 301.83   3. Chronic post-traumatic stress disorder (PTSD) F43.12 309.81   4. MDD (major depressive disorder), recurrent, in partial remission F33.41 296.35       Interventions:  Supportive Psychotherapy  Taught/practiced communication skills (specify skills used):  interpersonal  Solution Focused therapy    Current Treatment Plan   Created/Updated On 05/22/2017   Next Treatment Plan Due 08/19/2017     Plan:  Psychotherapy continues as described in care plan; plan remains the same.    NEXT APPT: 08/07/17      Tresa Res, LCSW-R

## 2017-07-14 ENCOUNTER — Encounter: Payer: Self-pay | Admitting: Primary Care

## 2017-07-15 ENCOUNTER — Encounter: Payer: Self-pay | Admitting: Psychiatry

## 2017-07-15 ENCOUNTER — Telehealth: Payer: Self-pay

## 2017-07-15 ENCOUNTER — Other Ambulatory Visit: Payer: Self-pay | Admitting: Psychiatry

## 2017-07-15 NOTE — Telephone Encounter (Signed)
Elyse Hsu called to ask you to send a script to [facility] for [facility] because [employer] has approved it. She can be reached at [phone].  Thank you,  Nazirah Tri

## 2017-07-17 ENCOUNTER — Encounter: Payer: Self-pay | Admitting: Psychiatry

## 2017-07-17 ENCOUNTER — Ambulatory Visit: Payer: Medicare (Managed Care) | Admitting: Rehabilitative and Restorative Service Providers"

## 2017-07-17 ENCOUNTER — Ambulatory Visit: Payer: Medicare (Managed Care) | Attending: Psychiatry | Admitting: Psychiatry

## 2017-07-17 VITALS — BP 120/84 | HR 68 | Resp 17 | Ht 68.0 in | Wt 281.0 lb

## 2017-07-17 DIAGNOSIS — M5412 Radiculopathy, cervical region: Secondary | ICD-10-CM

## 2017-07-17 DIAGNOSIS — E1165 Type 2 diabetes mellitus with hyperglycemia: Secondary | ICD-10-CM

## 2017-07-17 MED ORDER — OMNIPOD DASH PDM (GEN 4) KIT
1.0000 | PACK | 0 refills | Status: DC | PRN
Start: 2017-07-17 — End: 2017-11-10

## 2017-07-17 MED ORDER — OMNIPOD DASH PODS (GEN 4) MISC
1.0000 | 3 refills | Status: DC | PRN
Start: 2017-07-17 — End: 2017-11-10

## 2017-07-17 NOTE — Progress Notes (Signed)
Endocrinology, Diabetes, and Metabolism [facility] for Diabetes.    Consult requested by: Kaylee Harris    HPI:    This is a 42 y.o. female with a history of Type 2 Diabetes for almost 10 years.  Known complications include none.    Since her last visit, she denies any hypoglycemia or LOC. She has been learning how to carb count and is preparing for the OmniPod pump. She has been wearing the VGo40 and is doing better with her diabetes self management.   She denies any polyuria or polydipsia, no double or blurry vision, no CP or SOB. No complaint paresthesias  Her partner is present and supportive    Current diabetes regimen:  Basal insulin:  VGo 40 kit       Bolus insulin:  36 6 clicks per meal  TDI: 76  Other medications:  trulicity listed below    she does not have a prescription for glucagon at home; family knows how to use if when needed.  she does not have ketostrips at home.    Diabetic medications are not always taken appropriately.  There are few missed doses a week.      Blood sugar logs and/or meter were brought to the visit for review.    Fasting 121-250  Lunch 159-213  Dinner 132-321    hypoglycemia occurs 0 times a week.  It is most often at never.  There has been the need for medical assistance for these episodes none.      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 Lower abdomen with cellulitis changes, wound probed and purulent material expressed.  Had PICC line for "multiple infiltrations" of what? D/c-ed home on 10d of Augmentin 9/11.      Anginal pain     Anxiety     Arthritis     Asthma     Depression     Diabetes mellitus     Previously on SU and metformin, now diet controlled    Diverticulitis 09/2010     Dysfunctional uterine bleeding     Fibromyalgia     GERD (gastroesophageal reflux disease)     GERD (gastroesophageal reflux disease) 10/04/2016    High blood pressure     Hyperlipidemia     Liver disease     fatty liver     Long term (current) use of insulin, Dermal Adhesed V-Go Insulin Delivery Device 10/04/2016    Migraine     Neuromuscular disorder     POTS (postural orthostatic tachycardia syndrome)     Sebaceous cyst of breast     right axilla    TMJ click, left 08/07/1538    Varicella      Past Surgical History:   Procedure Laterality Date    APPENDECTOMY  2016    arthroscopic shoulder surgery Right 2010    Related to lifting    CARDIAC CATHETERIZATION  09/2011    negative    COLON SURGERY      DILATION AND CURETTAGE OF UTERUS  2005, 2007    x2 for menorraghia    LEFT COLECTOMY  01/03/14    Dr Tresa Res  loop recorder  Feb 03 2014    loop recorder removal      PR COLONOSCOPY THRU COLOTOMY N/A 02/16/2016    Procedure: COLONOSCOPY;  Surgeon: Kelly Splinter, Harris;  Location: [facility];  Service: GI    PR CYSTOURETHROSCOPY,BIOPSY N/A 10/17/2016    Procedure: CYSTOSCOPY BLADDER ;  Surgeon: Ed Blalock, Harris;  Location: [facility];  Service: Urology    PR EDG TRANSORAL BIOPSY SINGLE/MULTIPLE N/A 10/29/2016    Procedure: EGD;  Surgeon: Kelly Splinter, Harris;  Location: [facility];  Service: GI    TONSILLECTOMY       Family History   Problem Relation Age of Onset    Hypertension Father     Diabetes Father     Kidney Disease Father     Elevated lipids Father     Heart attack Father 42    Other Father         PVD    Heart Disease Father     Hypertension Mother     Elevated lipids Mother     Heart attack Mother 28    Diabetes Mother     Eczema Mother     Psoriasis Mother     Heart Disease Mother     Hypertension Brother     Heart Disease Brother 50        prinzmetal's angina    Heart Disease Sister         currently having work up    [place] Sister      Hypertension Sister     Breast cancer Maternal Grandmother     Stroke Other     Cancer Sister     Thyroid disease Sister     Anesth problems Neg Hx      Social History     Social History    Marital status: Married     Spouse name: N/A    Number of children: N/A    Years of education: N/A     Social History Main Topics    Smoking status: Former Smoker     Packs/day: 0.50     Years: 3.00     Types: Cigarettes    Smokeless tobacco: Never Used      Comment: quit age late 60s    Alcohol use 0.0 oz/week      Comment: 0-1 drink/ month    Drug use: No    Sexual activity: Yes     Partners: Female     Patent examiner protection: IUD     Other Topics Concern    None     Social History Narrative    ** Merged History Encounter **         Married to [name], recently relocated back to [place] from [place] due to family stressors. 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        Allergies:   Allergies   Allergen Reactions    Morphine Itching    Trazodone Anaphylaxis    Seasonal Allergies Itching    Other [Other] Other (See Comments)     Derma Bond(skin glue) pruritis/redness and c/o respiratory distress.   Received name: Other       Current Outpatient Prescriptions on File Prior to Visit   Medication Sig Dispense Refill    busPIRone (BUSPAR) 30 MG tablet TAKE 1 TABLET BY MOUTH TWO TIMES DAILY 60 tablet 2    DULoxetine (CYMBALTA) 20 MG DR capsule  TAKE 1 CAPSULE BY MOUTH EVERY DAY 30 capsule 2    promethazine (PHENERGAN) 25 MG tablet Take 1 tablet (25 mg total) by mouth every 4-6 hours as needed for Nausea 30 tablet 3    pregabalin (LYRICA) 150 MG capsule Take 1 capsule (150 mg total) by mouth 2 times daily   Max daily dose: 300 mg MAXIMUM DAILY DOSE OF 2 PER DAY 60 capsule 0    continuous blood glucose monitor (FREESTYLE LIBRE) reader Use with Freestyle Libre CGM sensor 1 Device 0    continuous blood glucose monitor (FREESTYLE LIBRE) sensor Apply sensor to back of  upper arm. Replace sensor every 10 days. 3 each 5    cetirizine (ZYRTEC) 10 MG tablet Take 1 tablet (10 mg total) by mouth daily 90 tablet 1    topiramate (TOPAMAX) 100 MG tablet Take 1 tablet (100 mg total) by mouth 2 times daily 60 tablet 5    pantoprazole (PROTONIX) 40 MG EC tablet Take 1 tablet (40 mg total) by mouth daily   SWALLOW WHOLE. DO NOT CRUSH, BREAK, OR CHEW. 30 tablet 5    Alcohol Swabs (ALCOHOL PREP) PADS Use BID for BG checkUse BID for BG check 100 each 1    traMADol (ULTRAM) 50 MG tablet Take 1 tablet (50 mg total) by mouth every 8 hours as needed (for head pain and abd pain)   Max daily dose: 150 mg 42 tablet 0    insulin lispro 100 UNIT/ML injection vial Inject as directed up to MDD 100 units 40 mL 11    SUMAtriptan (IMITREX) 50 MG tablet TAKE 1 TABLET BY MOUTH AS NEEDED FOR MIGRAINE, TAKE AT ONSET OF HEADACHE. MAY REPEAT ONCE IN 2 HOURS 9 tablet 5    TRULICITY 1.5 KP/5.3ZS injection pen INJECT THE CONTENTS OF ONE PEN (0.5ML) INTO THE SKIN EVERY 7 DAYS 2 mL 5    fluticasone (FLONASE) 50 MCG/ACT nasal spray 1 spray by Nasal route daily 48 g 3    clonazePAM (KLONOPIN) 0.5 MG tablet Take 1 tablet (0.5 mg total) by mouth daily as needed   Max daily dose: 0.5 mg 28 tablet 0    TOUJEO SOLOSTAR 300 UNIT/ML SOPN INJECT 30 UNITS INTO THE SKIN NIGHTLY 7.5 mL 5    SUMAVEL DOSEPRO 6 MG/0.5ML needle-free injection INJECT 0.5MLS (6MG TOTAL) UNDER THE SKIN AS NEEDED FOR MIGRAINE MAY REPEAT ONCE AFTER 1 HOUR IF NEEDED  1    atorvastatin (LIPITOR) 40 MG tablet Take 1 tablet (40 mg total) by mouth daily (with dinner) 90 tablet 3    albuterol HFA 108 (90 Base) MCG/ACT inhaler Inhale 1-2 puffs into the lungs every 6 hours as needed for Wheezing   Shake well before each use. 1 Inhaler 5    phenazopyridine (PYRIDIUM) 100 MG tablet Take 1 tablet (100 mg total) by mouth every 8 hours as needed 20 tablet 0    famciclovir (FAMVIR) 500 MG tablet Take 1 tablet (500 mg total) by mouth 3 times daily as needed  30 tablet 5    dulaglutide (TRULICITY) 1.5 MO/7.0BE injection pen Inject 0.5 mLs (1.5 mg total) into the skin every 7 days 2 mL 5    metoprolol (TOPROL-XL) 25 MG 24 hr tablet Take by mouth daily         ascorbic acid (VITAMIN C) 100 MG tablet Take 100 mg by mouth daily      midodrine (PROAMATINE) 10 MG tablet Take 1 tablet (10 mg total) by mouth 3 times  daily 90 tablet 0    insulin syringe-needle U-100 (BD ULTRAFINE) 31G X 5/16" 0.3 ML HALF-UNIT Use 3 times a day as instructed. 100 each 0    FREESTYLE LITE test strip Use four times daily as directed for 250.02 100 each 11    blood glucose monitor system Brand: cheapest brand available per her insurance.  Use as directed. 1 kit 0    lancets Brand Free Style Lite; Use 2 times per day as directed for blood glucose testing. 100 each 2    levonorgestrel (MIRENA) 20 MCG/24HR IUD 1 each by Intrauterine route once      Non-System Medication The above patient is followed in our clinic and cannot resume work permanently. 1 each 1     No current facility-administered medications on file prior to visit.        Review of Systems:  CONSTITUTIONAL:  Appetite good, no fevers, night sweats or weight loss  HEAD: No headache, dizziness or syncope  EYES:No vision changes or eye pain  ENT: No hearing difficulties or ear pain  CV: No chest pain, shortness of breath or edema.  RESPIRATORY:  No SOB, cough or wheezing  GI:  No nausea, vomiting, abdominal pain or change in bowel habits  GU:  No dysuria, urgency or incontinence  NEURO:  No mental status changes, motor weakness or sensory changes  PSYCH:  + depression and anxiety  MS:  No joint pain, swelling or musculoskeletal deformities  SKIN:  No rashes  HEME/LYMPH:  No easy bleeding, bruising or swollen nodes  ENDOCRINE:  No polyuria, polydipsia, polyphagia or heat/cold intolerance    Physical Examination:   Vitals:    07/17/17 1520   BP: 120/84   Pulse: 68   Resp: 17   Weight: 127.5 kg (281 lb)   Height: 1.727 m (5' 8")      Body mass index is 42.73 kg/m.  Wt Readings from Last 3 Encounters:   07/17/17 127.5 kg (281 lb)   07/09/17 128.4 kg (283 lb)   07/02/17 127.5 kg (281 lb)       CONSTITUTIONAL:  well developed, well nourished, no acute distress  HEENT: EOMI, no lid lag, pink conjunctiva, no proptosis.   NECK: supple, no thyromegaly, no lymphadenopathy, no acanthosis nigricans  HEART/VASCULAR: RRR, normal S1, S2, no MRG, no JVD or carotid bruit, no LE edema, clubbing, or cyanosis.  Pedal pulses 2+ bilaterally.  CHEST: lungs clear to auscultation bilaterally posteriorly  ABDOMEN: soft, nontender, nondistended  EXTREMITIES:  joints without deformity or synovitis. Foot exam deferred.  NEUROLOGICAL: alert and oriented x 3. Full power bilateral upper and lower extremities. No tremor noted.  SKIN:  No rashes. No lipohypertrophy.  LYMPH: no palpable lymphadenopathy.  ENDOCRINE: No supraclavicular fat pads, facial plethora, moon facies, abdominal striae, thyromegaly.  PSYCH: stable and followed by psychiatry      Labs and Imaging:    I personally reviewed and confirmed all laboratory and radiology testing listed below:      Lab results: 04/18/17  0933 03/28/17  1717   Sodium  --  140   Potassium  --  4.1   Chloride  --  105   CO2  --  22   UN  --  10   Creatinine  --  0.77   GFR,Caucasian  --  95   GFR,Black  --  109   Glucose 158* 149*   Calcium  --  9.5         Lab  Results   Component Value Date    HA1C 9.4 (H) 05/13/2017       Lab Results   Component Value Date    ALT 42 (H) 02/17/2017    AST 32 02/17/2017     Lab Results   Component Value Date    CHOL 160 05/13/2017    HDL 34 05/13/2017    LDLC 79 05/13/2017    TRIG 234 (!) 05/13/2017    CHHDC 4.7 05/13/2017     Lab Results   Component Value Date    TSH 1.31 07/10/2015       Lab Results   Component Value Date    WBC 8.1 03/30/2017    HGB 11.8 03/30/2017    HCT 37 03/30/2017    MCV 88 03/30/2017    PLT 319 03/30/2017       Assessment: This is a 42 y.o. female with Type 2 Diabetes  Uncontrolled with hyperglycemia.  Goal A1c is <7%.  Her blood sugar control is improving and will be pump ready soon. Needs an additional visit for carb counting review before pump start.     Plan:   Changes in diabetic regimen are as follows: Continue VGo 40  Appt with Mickel Baas for final review of carb counting, once she receives pump we will coordinate pump training with Omnipod trainer.  BP is at goal on current therapy.  On statin therapy.  Recommend annual dilated eye exams and biannual dental exams for routine health maintenance.  hba1c next month    Follow up: 3 month(s) or sooner if needed.      Nada Libman, NP    Nada Libman, NP  [facility] Department of Endocrinology  [address] [place], [place] [address]  Phone [phone]  Fax [phone]

## 2017-07-17 NOTE — Patient Instructions (Signed)
[facility] [phone] - [place] [phone] (Phone)  [phone] (Fax)

## 2017-07-17 NOTE — Progress Notes (Signed)
[facility]  PT Note    PRISILLA KOCSIS   [id number]     Diagnosis: cervical radiculopathy    Subjective:  Pain Score:  4  Pain: Unchanged    Objective:  ROM - Unchanged, Bilateral, Neck,   Strength - Ther Ex per flowsheet, using hand for cervical isomertrics.   Function: - Unchanged  Education:  Updated HEP, Verbal cues for ther ex, Manual cues for ther ex, Independent symptom management techniques, Activity modification    Objective             Mechanical cervical traction Intermittent cervical traction 20' x 10' intermittent (60/20)       Cervical isom with ball on wall 5" x 5   UT str 15' x 3   Chin tucks 10x                       Treatment:  Ther Exercise per flowsheet, mech cervical traction    Assessment:   Patient likes traction and felt better after this. She likes ball on wall method for cervical isometrics       Plan of Care:  Continue per Plan of care -  As written; Patient would benefit from skilled rehabilitation services to address the above impairments to restore functional capacity.    Thank you for referring this patient to [facility] Time 3790   Treatment End Time 120   Total Minutes of treatment 35       Total Non-Treatment time(rest, etc)    Total Service Based Min. of Treatment 25   Total Time-Based minutes of treatment 10           Service-Based Procedures/ Modalities    Evaluation    Re-evaluation    Traction, mechanical 25   Electric stimulation (unattended)    Whirlpool    Vasopneumatic device    Total service-based billable procedures 25       Time-Based  Procedures / Modalities    Electric stimulation (manual)    Iontophoresis    Contrast baths    Ultrasound    Physical Performance Test    Therapeutic ex 10   Neuromuscular reed    Gait training, including stairs    Manual Therapy    Therapeutic Activities    Total Time-Based Billable Procedures 10            FUNCTIONAL REPORTING for PT    POC DATE: 07/08/17

## 2017-07-18 ENCOUNTER — Other Ambulatory Visit: Payer: Self-pay | Admitting: Psychiatry

## 2017-07-18 ENCOUNTER — Telehealth: Payer: Self-pay | Admitting: Physical Medicine and Rehabilitation

## 2017-07-18 ENCOUNTER — Telehealth: Payer: Self-pay

## 2017-07-18 MED ORDER — LAMOTRIGINE 150 MG PO TABS *I'
150.0000 mg | ORAL_TABLET | Freq: Every day | ORAL | 0 refills | Status: DC
Start: 2017-07-18 — End: 2017-10-06

## 2017-07-18 NOTE — Telephone Encounter (Signed)
Please see staff message sent yesterday re injection denial from prior auth dept.   Patient says she is in a lot of pain in back and hips, difficulty doing housework.   Patient requesting meds for the pain

## 2017-07-18 NOTE — Telephone Encounter (Signed)
Pt states she received word from pharmacy that Omni pod is shipping this week.  She was given name and number of omni pod trainer: Brantley Stage, to set up training date.  Will given new pump order form to S. Bartlett endo NP.  Eartha Inch, RN

## 2017-07-18 NOTE — Telephone Encounter (Signed)
Patient stated she should be receiving her omnipod tomorrow.  Would like to schedule appointment for training.  [phone]

## 2017-07-20 ENCOUNTER — Other Ambulatory Visit: Payer: Self-pay | Admitting: Psychiatry

## 2017-07-22 ENCOUNTER — Encounter: Payer: Self-pay | Admitting: Psychiatry

## 2017-07-24 ENCOUNTER — Encounter: Payer: Self-pay | Admitting: Physical Medicine and Rehabilitation

## 2017-07-25 ENCOUNTER — Other Ambulatory Visit: Payer: Self-pay | Admitting: Physical Medicine and Rehabilitation

## 2017-07-25 ENCOUNTER — Ambulatory Visit: Payer: Medicare (Managed Care) | Admitting: Rehabilitative and Restorative Service Providers"

## 2017-07-25 MED ORDER — LIDOCAINE 5 % EX PTCH *I*
1.0000 | MEDICATED_PATCH | CUTANEOUS | 0 refills | Status: DC
Start: 2017-07-25 — End: 2017-09-30

## 2017-07-25 NOTE — Telephone Encounter (Signed)
Requested referral or rx has been completed

## 2017-07-27 ENCOUNTER — Encounter: Payer: Self-pay | Admitting: Registered Nurse

## 2017-07-27 ENCOUNTER — Other Ambulatory Visit: Payer: Self-pay | Admitting: Registered Nurse

## 2017-07-28 ENCOUNTER — Other Ambulatory Visit: Payer: Self-pay | Admitting: Registered Nurse

## 2017-07-28 ENCOUNTER — Ambulatory Visit: Payer: Medicare (Managed Care) | Admitting: Rehabilitative and Restorative Service Providers"

## 2017-07-28 DIAGNOSIS — IMO0002 Reserved for concepts with insufficient information to code with codable children: Secondary | ICD-10-CM

## 2017-07-28 DIAGNOSIS — M5412 Radiculopathy, cervical region: Secondary | ICD-10-CM

## 2017-07-28 MED ORDER — GLUCOSE BLOOD VI STRP *A*
ORAL_STRIP | 11 refills | Status: DC
Start: 2017-07-28 — End: 2017-11-10

## 2017-07-28 NOTE — Progress Notes (Signed)
[facility]  PT Note    Kaylee Harris   [id number]     Diagnosis: cervical radiculopathy    Subjective:  Pain Score:  4  Pain: Unchanged    Objective:  ROM - Unchanged, Bilateral, Neck,   Strength - Ther Ex per flowsheet, isometric progression advanced to include extension using band  Function: - discussed sleeping position - patient sleeps prone exclusively   Education:  Updated HEP, Verbal cues for ther ex, Manual cues for ther ex, Independent symptom management techniques, Activity modification    Objective         MHP cervical wrap 10'    Intermittent cervical traction 25 lbs x 10' intermittent (60/20)       Cervical isom with ball on wall 5" x 5   UT str 15' x 3   Chin tucks 10x   Cervical isom extension Orange 15" x 5   busdrivers Orange  15" x 5               Treatment:  Ther Exercise per flowsheet, mech cervical traction - added Busdrivers for postural reinforcement and isom extension with resistive band    Assessment:   Patient sleeps prone. But when she sleeps on side it is a modified sidelying. She felt McKenzie oll felt comfortable and she will look into obtaining a contour pillow or just purchase Mckenzie roll at next visit       Plan of Care:  Continue per Plan of care -  As written; Patient would benefit from skilled rehabilitation services to address the above impairments to restore functional capacity. consider patient application for home traction unit    Thank you for referring this patient to [facility] Start Time 300   Treatment End Time 400   Total Minutes of treatment 60       Total Non-Treatment time(rest, etc) 20   Total Service Based Min. of Treatment 20   Total Time-Based minutes of treatment 20           Service-Based Procedures/ Modalities    Evaluation    Re-evaluation    Traction, mechanical 20   Electric stimulation (unattended)    Whirlpool    Vasopneumatic device     Total service-based billable procedures 20       Time-Based  Procedures / Modalities    Electric stimulation (manual)    Iontophoresis    Contrast baths    Ultrasound    Physical Performance Test    Therapeutic ex 20   Neuromuscular reed    Gait training, including stairs    Manual Therapy    Therapeutic Activities    Total Time-Based Billable Procedures 20           FUNCTIONAL REPORTING for PT    POC DATE: 07/08/17

## 2017-07-31 ENCOUNTER — Ambulatory Visit: Payer: Medicare (Managed Care) | Admitting: Rehabilitative and Restorative Service Providers"

## 2017-08-06 ENCOUNTER — Encounter: Payer: Self-pay | Admitting: Podiatry

## 2017-08-06 ENCOUNTER — Ambulatory Visit: Payer: Medicare (Managed Care) | Attending: Podiatry | Admitting: Podiatry

## 2017-08-06 VITALS — Ht 68.0 in | Wt 281.0 lb

## 2017-08-06 DIAGNOSIS — L600 Ingrowing nail: Secondary | ICD-10-CM

## 2017-08-07 ENCOUNTER — Ambulatory Visit: Payer: Medicare (Managed Care) | Admitting: Rehabilitative and Restorative Service Providers"

## 2017-08-07 ENCOUNTER — Encounter: Payer: Self-pay | Admitting: Primary Care

## 2017-08-07 ENCOUNTER — Ambulatory Visit: Payer: Medicare (Managed Care) | Attending: Psychiatry

## 2017-08-07 ENCOUNTER — Ambulatory Visit: Payer: Medicare (Managed Care) | Attending: Nutrition | Admitting: Nutrition

## 2017-08-07 ENCOUNTER — Other Ambulatory Visit: Payer: Self-pay | Admitting: Psychiatry

## 2017-08-07 VITALS — Wt 278.1 lb

## 2017-08-07 DIAGNOSIS — F4312 Post-traumatic stress disorder, chronic: Secondary | ICD-10-CM | POA: Insufficient documentation

## 2017-08-07 DIAGNOSIS — Z794 Long term (current) use of insulin: Secondary | ICD-10-CM | POA: Insufficient documentation

## 2017-08-07 DIAGNOSIS — F422 Mixed obsessional thoughts and acts: Secondary | ICD-10-CM | POA: Insufficient documentation

## 2017-08-07 DIAGNOSIS — E1165 Type 2 diabetes mellitus with hyperglycemia: Secondary | ICD-10-CM | POA: Insufficient documentation

## 2017-08-07 DIAGNOSIS — F603 Borderline personality disorder: Secondary | ICD-10-CM | POA: Insufficient documentation

## 2017-08-07 DIAGNOSIS — F3341 Major depressive disorder, recurrent, in partial remission: Secondary | ICD-10-CM | POA: Insufficient documentation
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
## 2017-08-07 NOTE — Progress Notes (Signed)
Kaylee Harris is here today for [facility] regarding Insulin requiring Type 2 Diabetes. Referred by: PCP  Here with wife, [name]  Needs to learn carb ct- starting Omni pod tomorrow     Using VGO insulin delivery but changing to Omnipod   On Trulicity meds covered by [facility] Readings from Last 3 Encounters:   08/07/17 126.1 kg (278 lb 1.6 oz)   08/06/17 127.5 kg (281 lb)   07/17/17 127.5 kg (281 lb)     Lab Results   Component Value Date    HA1C 9.4 (H) 05/13/2017    HA1C 11.0 (H) 02/17/2017    HA1C 7.4 (H) 05/21/2016    MALBR 0.43 04/23/2017    CREAT 0.77 03/28/2017    LDLC 79 05/13/2017       Diet: 2 meals + grazing on snacks;  not going to program anymore;    Beverages: Powerade Zero; water, coffee, creamer   ETOH: no  Exercise : sedentary  ; dizzy spells  DM Meds: VGO Taking as prescribed.       Meter:  Freestyle lite 3 time daily,  Ac meals    Assessment:   DM- instructed carb ct; math skills fair, will need to use calculator; given diary book to record/ practice   Reviewed using calorie king book or app; chain restaurant websites     Obesity  -down 3 lb in 3 months  ; net down 11 lb; Exercise very limited    Coun/Edu:     Barriers to learning:  Neuro problems  Education Provided: Plate Method label reading and carb factors    Plan:   Nutrition Goals:   1500 calories  150-180 gm carbs  Initial Weight goal: (-10%) 270 lb    Follow-up visit: 4 mo   Time spent counseling patient: 60 minutes    M[employer] insurance MD follow up referral is in eRecord  .  MNT 0 hrs remain   Expires 05/05/18    Marjo Bicker, RD

## 2017-08-07 NOTE — Progress Notes (Signed)
Chief Complaint   Patient presents with    Follow-up    Ingrown Toenail     right hallux-resolved     Subjective:     Patient presents to clinic for follow up of an ingrown right first toe nail and partial nail avulsion procedure.  She states that the toe is not painful on today's visit.  She denies any pus or bloody drainage from the toe.    Patient ID: Kaylee Harris is a 42 y.o. female.  Patient Active Problem List   Diagnosis Code    DM (diabetes mellitus), type 2, uncontrolled E11.65    Migraine with aura G43.109    Asthma J45.909    Hyperlipemia E78.5    Fibromyalgia M79.7    Syncope and collapse- recurrent, working diagnosis vascular vs psychogenic. 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1    Traumatic hemorrhage of right cerebrum S06.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    Diverticulitis K57.92    Long term (current) use of insulin, Dermal Adhesed V-Go Insulin Delivery Device Z79.4    Morbid obesity with BMI of 40.0-44.9, adult E66.01, Z68.41    Hematuria R31.9    Right hip pain M25.551    Labial abscess N76.4    Labial cyst N90.7    Low back pain M54.5    Lumbar facet arthropathy M47.816     Current Outpatient Prescriptions   Medication    blood glucose test strip    lidocaine (LIDODERM) 5 % patch    mirtazapine (REMERON) 15 MG tablet    lamoTRIgine  (LAMICTAL) 150 MG tablet    Insulin Disposable Pump (OMNIPOD DASH SYSTEM) KIT    Insulin Disposable Pump (OMNIPOD DASH 5 PACK) MISC    busPIRone (BUSPAR) 30 MG tablet    DULoxetine (CYMBALTA) 20 MG DR capsule    promethazine (PHENERGAN) 25 MG tablet    pregabalin (LYRICA) 150 MG capsule    continuous blood glucose monitor (FREESTYLE LIBRE) reader    continuous blood glucose monitor (FREESTYLE LIBRE) sensor    cetirizine (ZYRTEC) 10 MG tablet    topiramate (TOPAMAX) 100 MG tablet    pantoprazole (PROTONIX) 40 MG EC tablet    Alcohol Swabs (ALCOHOL PREP) PADS    traMADol (ULTRAM) 50 MG tablet    insulin lispro 100 UNIT/ML injection vial    SUMAtriptan (IMITREX) 50 MG tablet    TRULICITY 1.5 IO/2.7OJ injection pen    fluticasone (FLONASE) 50 MCG/ACT nasal spray    clonazePAM (KLONOPIN) 0.5 MG tablet    TOUJEO SOLOSTAR 300 UNIT/ML SOPN    SUMAVEL DOSEPRO 6 MG/0.5ML needle-free injection    atorvastatin (LIPITOR) 40 MG tablet    albuterol HFA 108 (90 Base) MCG/ACT inhaler    phenazopyridine (PYRIDIUM) 100 MG tablet  famciclovir (FAMVIR) 500 MG tablet    dulaglutide (TRULICITY) 1.5 ZO/1.0RU injection pen    metoprolol (TOPROL-XL) 25 MG 24 hr tablet    ascorbic acid (VITAMIN C) 100 MG tablet    midodrine (PROAMATINE) 10 MG tablet    insulin syringe-needle U-100 (BD ULTRAFINE) 31G X 5/16" 0.3 ML HALF-UNIT    blood glucose monitor system    lancets    levonorgestrel (MIRENA) 20 MCG/24HR IUD    Non-System Medication     No current facility-administered medications for this visit.      Allergy History as of 07/09/17     TRAZODONE       Noted Status Severity Type Reaction    01/08/13 1545 Morris, Arrie Aran 01/08/13 Active High Allergy Anaphylaxis          MORPHINE       Noted Status Severity Type Reaction    03/28/17 2239 Frankey Poot, MD 01/08/13 Active High Allergy Itching    01/08/13 1545 Morris, Arrie Aran 01/08/13 Active High Allergy Anaphylaxis          TRAMADOL       Noted Status  Severity Type Reaction    07/06/14 1457 Beatriz Chancellor, RN 07/06/14 Deleted High Allergy Anaphylaxis    07/06/14 1457 Beatriz Chancellor, RN 07/06/14 Active High Allergy Anaphylaxis          KETOROLAC TROMETHAMINE       Noted Status Severity Type Reaction    02/23/17 1425 Dorathy Kinsman, MD 07/06/14 Deleted High Allergy Anaphylaxis    Comments:  Notes feeling tightness in her throat and full body itching after last dose a month ago. Also notes erythema tracking along her vein after injection. This is not reflected by documentation.  Tolerates Naproxen.     07/20/14 1845 Conception Oms, PharmD 07/06/14 Active High Allergy Anaphylaxis    Comments:  Notes feeling tightness in her throat and full body itching after last dose a month ago. Also notes erythema tracking along her vein after injection. This is not reflected by documentation.  Tolerates Naproxen.     07/06/14 Baden, MD 07/06/14 Active High Allergy Anaphylaxis    Comments:  Notes feeling tightness in her throat and full body itching after last dose a month ago. Also notes erythema tracking along her vein after injection. This is not reflected by documentation.     07/06/14 1458 Beatriz Chancellor, RN 07/06/14 Active High Allergy Anaphylaxis          KETOROLAC TROMETHAMINE       Noted Status Severity Type Reaction    10/04/16 1448 Docia Chuck, PA 10/01/14 Deleted Low Allergy Other (See Comments)    Comments:  unknown     10/01/14 1837 Mosko, [place], RN 10/01/14 Active Low Allergy Other (See Comments)    Comments:  unknown           TRAZODONE       Noted Status Severity Type Reaction    10/04/16 1447 Docia Chuck, [place] 10/01/14 Deleted Low Allergy Other (See Comments)    Comments:  unknown     10/01/14 1837 Mosko, [place], RN 10/01/14 Active Low Allergy Other (See Comments)    Comments:  unknown           MORPHINE       Noted Status Severity Type Reaction    10/04/16 1447 Docia Chuck, PA 10/01/14 Deleted Low Allergy Other (See Comments)     Comments:  unknown     10/01/14 1837 Mosko,  Lenna Sciara, RN 10/01/14 Active Low Allergy Other (See Comments)    Comments:  unknown           Other [Other]       Noted Status Severity Type Reaction    10/04/16 1449 Docia Chuck, PA 10/31/15 Active Low  Other (See Comments)    Comments:  Derma Bond(skin glue) pruritis/redness and c/o respiratory distress.   Received name: Other           SEASONAL ALLERGIES       Noted Status Severity Type Reaction    10/04/16 1449 Docia Chuck, PA 10/04/16 Active  Allergy Itching                HPI  Patient's medications, allergies, past medical, surgical, social and family histories were reviewed and updated as appropriate.    Review of Systems        Objective:   Physical Exam    Vascular:  Dorsalis pedis pulses are 2+ (normal)/4 bilateral and posterior tibial pulses are 2+ (normal)/4 bilateral.  Capillary refill time is less than 3 seconds to all toes.  Skin temperature is warm to warm from proximal to distal.  Pedal hair growth is present.      Musculoskeletal:  No pain with palpation of medial border of the right first toenail.    Dermatological:  Toe nails 1 through 5 bilateral are thickened, dystrophic, and mycotic with subungual debris.  Web spaces 1 through 4 bilateral are dry and intact. No open lesions or excoriations are noted.  Medial border of the right first toenail is healed.  No signs of infection noted.    Neurological:  Vibratory sensation is intact to the hallux IPJ bilaterally.  Protective sensation is intact to 10 out of 10 sites bilateral.        Assessment:         1. Ingrowing right great toenail         Plan:      1.    Patient was examined and evaluated for follow up of a painful ingrown medial border of the right first toenail with partial nail avulsion with phenol matrixectomy.  She was advised to discontinue soaking and bandaging the toe.  She may return to normal shoes and activities to tolerance.    2.  Follow-up as needed.

## 2017-08-08 ENCOUNTER — Ambulatory Visit: Payer: Medicare (Managed Care) | Admitting: Rehabilitative and Restorative Service Providers"

## 2017-08-08 ENCOUNTER — Other Ambulatory Visit: Payer: Self-pay | Admitting: Psychiatry

## 2017-08-08 MED ORDER — IBUPROFEN 600 MG PO TABS *I*
600.0000 mg | ORAL_TABLET | Freq: Three times a day (TID) | ORAL | 3 refills | Status: DC | PRN
Start: 2017-08-08 — End: 2017-11-25

## 2017-08-08 MED ORDER — CLONAZEPAM 0.5 MG PO TABS *I*
0.5000 mg | ORAL_TABLET | Freq: Every day | ORAL | 0 refills | Status: DC | PRN
Start: 2017-08-08 — End: 2018-07-22

## 2017-08-08 NOTE — Progress Notes (Deleted)
SPINE REHABILITATION RE-EVALUATION    History  Diagnosis: No diagnosis found.    Referring MD: Truett Mainland    Subjective    Kaylee Harris is a 42 y.o. female who is receiving treatment for {SPINE REGION:30900} Mechanism of injury/history of symptoms: {PT Mechanism of injury:26756}    Pt has received care for {NUMBERS 0-12:18577} visits to date.  Attendance:  {good/fair/poor:27869}   Compliance:  {good/fair/poor:27869}  Symptom location: {anterior/posterior/lateral:12563}, {Left/right:33004}  Relevant symptoms: {PT UE symptoms:26762}  Symptom frequency: {Desc; intermittent/persistent/constant:12478}  Symptom intensity (0 - 10 scale): Now {PAIN XBJYNW:29562} Best {PAIN LEVELS:22940} Worst {PAIN LEVELS:22940}  Patients goals for therapy:  {goals:26767}, {walk,stand, sit:31853} without symptoms     Objective:    Palpation:  {palpation:27032}  ROM Loss    Flexion: {ROM:27033}  Extension:  {ROM:27033}  Left Sidebend: {ROM:27033}  Right Sidebend:  {ROM:27033}  Left Rotation:  {ROM:27033}  Right Rotation:  {ROM:27033}  Repeated Movement Testing:  {Repeated Movement Testing:27034}   Neuromuscular Assessment: {Neurologic:23749}     Test:     Lumbar:  {Lumbar Tests:29916}              Cervical Spine:  {Cervical Tests:29919}   Sacroilliac Joint:  {Sacroilliac Tests:29921}                       Flexibility:   Hamstring:  {Flexibility:27040}  Quads:  {Flexibility:27040}  Psoas:  {Flexibility:27040}    Strength: {Strength:33470}    McGill Stage: {McGill Stage:27018}    Comments:  {NA/explanation:16022516}      MHP cervical wrap 10'    Intermittent cervical traction 25 lbs x 10' intermittent (60/20)       Cervical isom with ball on wall 5" x 5   UT str 15' x 3   Chin tucks 10x   Cervical isom extension Orange 15" x 5   busdrivers Orange  15" x 5             SPINE ASSESSMENT    Assessment: consistent with 42 y.o. {female/female:29594} with No diagnosis found. with {pain consistent ZHYQ:65784}    Prognosis:  {good/fair/poor:27869}      Contraindications/Precautions/Limitation:  {Contraindications/precautions:23709}  Short Term Goals: ({NUMBERS 1-8:23345} {MONTHS/WEEKS/DAYS:23338}):  {Short term goals:23710}  Long Term Goals:({NUMBERS 1-8:23345} {MONTHS/WEEKS/DAYS:23338}):   {LTG:29816}    Treatment Plan:     Patient/family involved in developing goals and treatment plan: {YES NO:22742}  Freq {NUMBERS 0-7:27905} times per {Time; day/week/month:13537} for {NUMBERS 0-7:20272} {MONTHS/WEEKS/DAYS:23338}    Treatment plan inclusive of:   Exercise: {Exercise:23697}   Manual Techniques:  {Manual Techniques:23698}   Modalities:  {Modalities:26249}   Functional: {Functional:23712}    Patient was educated on how the principles of the Active Spine Program will benefit their condition.  Patient was educated on the benefits of maintaining an active lifestyle. The patient was instructed on core strengthening exercises for spine support and stability; flexibility exercise for the upper and lower extremities to reduce movement stress to the spine; and aerobic exercise for weight management and endurance.  The patient was also educated on proper posture and body mechanics to avoid painful movements and to work on any directional preference positions.  Modalities for pain control, and traction will be used as necessary to benefit the patients symptoms.        Patient was instructed how to gradually progress their exercise routine within their range of tolerable symptoms.  Patient was advised that maintenance and consistency with the exercise routine at home is key to success.  And that it is critical that they notify their therapist of any prescribed activity that increases their symptoms so that the routine may be modified and progressed.      Thank you for the referral of this patient to the [facility].    Sincerely,  Birdie Hopes, PT

## 2017-08-08 NOTE — Telephone Encounter (Signed)
Last appt: 04/23/2017  Next appt:  10/20/2017    Recent Lab Values 03/31/2017 10/29/2016 10/17/2016 09/25/2016 01/18/2016 01/05/2016 01/04/2016   EXP DATE 04/04/18 04/04/2017 08/03/17 03/2017 - 06/2016 8-19   [facility] - - - - NEG - -

## 2017-08-12 ENCOUNTER — Other Ambulatory Visit: Payer: Self-pay

## 2017-08-12 MED ORDER — BUSPIRONE HCL 15 MG PO TABS *I*
30.0000 mg | ORAL_TABLET | Freq: Two times a day (BID) | ORAL | 2 refills | Status: DC
Start: 2017-08-12 — End: 2017-10-29

## 2017-08-13 ENCOUNTER — Encounter: Payer: Self-pay | Admitting: Psychiatry

## 2017-08-13 MED ORDER — GLUCAGON HCL (RDNA) 1 MG IJ SOLR *WRAPPED*
1.0000 mg | Freq: Once | INTRAMUSCULAR | 5 refills | Status: AC | PRN
Start: 2017-08-13 — End: ?

## 2017-08-14 ENCOUNTER — Ambulatory Visit: Payer: Medicare (Managed Care) | Admitting: Psychiatry

## 2017-08-14 ENCOUNTER — Encounter: Payer: Self-pay | Admitting: Psychiatry

## 2017-08-14 VITALS — BP 124/58 | HR 77 | Ht 68.0 in | Wt 275.0 lb

## 2017-08-14 DIAGNOSIS — F422 Mixed obsessional thoughts and acts: Secondary | ICD-10-CM

## 2017-08-14 DIAGNOSIS — F4312 Post-traumatic stress disorder, chronic: Secondary | ICD-10-CM

## 2017-08-14 DIAGNOSIS — F603 Borderline personality disorder: Secondary | ICD-10-CM

## 2017-08-14 MED ORDER — DULOXETINE HCL 30 MG PO CPEP *I*
30.0000 mg | DELAYED_RELEASE_CAPSULE | Freq: Two times a day (BID) | ORAL | 1 refills | Status: DC
Start: 2017-08-14 — End: 2017-10-08

## 2017-08-14 NOTE — Progress Notes (Signed)
Behavioral Health Psychopharmacology Follow-up     Length of Session: 20 minutes.    Diagnosis Addressed    ICD-10-CM ICD-9-CM   1. Mixed obsessional thoughts and acts F42.2 300.3   2. Borderline personality disorder F60.3 301.83   3. Chronic post-traumatic stress disorder (PTSD) F43.12 309.81     Chief Complaint:  "I went off the Lyrica and my fibromyalgia pain is worse."     Recent History and Response to Medications  Patient states: her mood has been more irritable since she has been off the Lyrica and she has noticed that her OCD seems to be a little worse  "But I've also lost 10 pounds since being off the Lyrica but the pain sucks."      Current use of alcohol or drugs: No        Neurovegetative Symptoms Review:  Energy level: fair  Concentration: fair  Sleep Quality: poor   Number of hours : 5  Appetite: poor     Wt Readings from Last 3 Encounters:   08/14/17 124.7 kg (275 lb)   08/07/17 126.1 kg (278 lb 1.6 oz)   08/06/17 127.5 kg (281 lb)     Enjoyment/interest: fair    Review of Systems:  Patient says her POTS "has been okay."  She has not fallen recently.  She now has an insulin pump which so far is working well for her and "my numbers are getting better."      Current Medications  Current Outpatient Prescriptions   Medication Sig    glucagon (GLUCAGEN) 1 MG injection kit Inject 1 mg into the skin once as needed for Low blood sugar   Discard any unused portion.    busPIRone (BUSPAR) 15 MG tablet Take 2 tablets (30 mg total) by mouth 2 times daily    clonazePAM (KLONOPIN) 0.5 MG tablet Take 1 tablet (0.5 mg total) by mouth daily as needed   Max daily dose: 0.5 mg    ibuprofen (ADVIL,MOTRIN) 600 MG tablet Take 1 tablet (600 mg total) by mouth 3 times daily as needed for Pain    blood glucose test strip Test  8 times a day.   Brand name of strips Freestyle BG test strips for linked omnipod pump    lidocaine (LIDODERM) 5 % patch Place 1 patch onto the skin every 24 hours   Remove & discard patch within  12 hours or as directed.    mirtazapine (REMERON) 15 MG tablet TAKE 1 TABLET BY MOUTH NIGHTLY    lamoTRIgine (LAMICTAL) 150 MG tablet Take 1 tablet (150 mg total) by mouth daily    Insulin Disposable Pump (OMNIPOD DASH SYSTEM) KIT By 1 kit no specified route as needed    Insulin Disposable Pump (OMNIPOD DASH 5 PACK) MISC By 1 Device no specified route as needed    DULoxetine (CYMBALTA) 20 MG DR capsule TAKE 1 CAPSULE BY MOUTH EVERY DAY    cetirizine (ZYRTEC) 10 MG tablet Take 1 tablet (10 mg total) by mouth daily    topiramate (TOPAMAX) 100 MG tablet Take 1 tablet (100 mg total) by mouth 2 times daily    pantoprazole (PROTONIX) 40 MG EC tablet Take 1 tablet (40 mg total) by mouth daily   SWALLOW WHOLE. DO NOT CRUSH, BREAK, OR CHEW.    Alcohol Swabs (ALCOHOL PREP) PADS Use BID for BG checkUse BID for BG check    traMADol (ULTRAM) 50 MG tablet Take 1 tablet (50 mg total) by mouth every 8 hours as  needed (for head pain and abd pain)   Max daily dose: 150 mg    insulin lispro 100 UNIT/ML injection vial Inject as directed up to MDD 100 units    SUMAtriptan (IMITREX) 50 MG tablet TAKE 1 TABLET BY MOUTH AS NEEDED FOR MIGRAINE, TAKE AT ONSET OF HEADACHE. MAY REPEAT ONCE IN 2 HOURS    TRULICITY 1.5 WF/0.9NA injection pen INJECT THE CONTENTS OF ONE PEN (0.5ML) INTO THE SKIN EVERY 7 DAYS    fluticasone (FLONASE) 50 MCG/ACT nasal spray 1 spray by Nasal route daily    atorvastatin (LIPITOR) 40 MG tablet Take 1 tablet (40 mg total) by mouth daily (with dinner)    metoprolol (TOPROL-XL) 25 MG 24 hr tablet Take by mouth daily       ascorbic acid (VITAMIN C) 100 MG tablet Take 100 mg by mouth daily    midodrine (PROAMATINE) 10 MG tablet Take 1 tablet (10 mg total) by mouth 3 times daily    blood glucose monitor system Brand: cheapest brand available per her insurance.  Use as directed.    lancets Brand Free Style Lite; Use 2 times per day as directed for blood glucose testing.    levonorgestrel (MIRENA) 20  MCG/24HR IUD 1 each by Intrauterine route once    promethazine (PHENERGAN) 25 MG tablet Take 1 tablet (25 mg total) by mouth every 4-6 hours as needed for Nausea    continuous blood glucose monitor (FREESTYLE LIBRE) reader Use with Freestyle Libre CGM sensor    continuous blood glucose monitor (FREESTYLE LIBRE) sensor Apply sensor to back of upper arm. Replace sensor every 10 days.    TOUJEO SOLOSTAR 300 UNIT/ML SOPN INJECT 30 UNITS INTO THE SKIN NIGHTLY    SUMAVEL DOSEPRO 6 MG/0.5ML needle-free injection INJECT 0.5MLS (6MG TOTAL) UNDER THE SKIN AS NEEDED FOR MIGRAINE MAY REPEAT ONCE AFTER 1 HOUR IF NEEDED    albuterol HFA 108 (90 Base) MCG/ACT inhaler Inhale 1-2 puffs into the lungs every 6 hours as needed for Wheezing   Shake well before each use.    phenazopyridine (PYRIDIUM) 100 MG tablet Take 1 tablet (100 mg total) by mouth every 8 hours as needed    famciclovir (FAMVIR) 500 MG tablet Take 1 tablet (500 mg total) by mouth 3 times daily as needed    dulaglutide (TRULICITY) 1.5 TF/5.7DU injection pen Inject 0.5 mLs (1.5 mg total) into the skin every 7 days    insulin syringe-needle U-100 (BD ULTRAFINE) 31G X 5/16" 0.3 ML HALF-UNIT Use 3 times a day as instructed.    Non-System Medication The above patient is followed in our clinic and cannot resume work permanently.     No current facility-administered medications for this visit.        Side Effects  Patient Reported Side Effects: None reported    Mental Status  APPEARANCE: Casual  ATTITUDE TOWARD INTERVIEWER: Cooperative  MOTOR ACTIVITY: WNL (within normal limits)  EYE CONTACT: Direct  SPEECH: Normal rate and tone  AFFECT: Full Range and Appropriate  MOOD: Euthymic  THOUGHT PROCESS: Normal  THOUGHT CONTENT: No unusual themes  PERCEPTION: Within normal limits  ORIENTATION: Alert and Oriented X 3.  CONCENTRATION: Good  MEMORY:   Recent: intact   Remote: intact  COGNITIVE FUNCTION: Average intelligence  JUDGMENT: Intact  IMPULSE CONTROL: Fair  INSIGHT:  Good and Fair    Risk Assessment    Self Injury: Patient Denies  Suicidal Ideation: Patient Denies  Homicidal Ideation: Patient Denies  Aggressive Behavior: Patient Denies  Results  GAD-7 Dates 08/14/2017 06/09/2017 05/22/2017 02/24/2017 01/20/2017 12/16/2016 11/04/2016   Total Score '5 10 7 3 6 7 4     ' Recent Review Flowsheet Data     PHQ Scores 08/14/2017 06/09/2017 05/22/2017 04/23/2017 02/24/2017 01/20/2017 12/16/2016    PSQ2 Q1 - Interest/Pleasure - - - N - - -    PSQ2 Q2 - Down, Depressed, Hopeless - - - N - - -    PHQ Q9 - Better Off Dead 0 0 0 - 0 0 0    PHQ Calculated Score '4 9 5 ' - '4 6 8          ' BP Readings from Last 3 Encounters:   08/14/17 124/58   07/17/17 120/84   07/02/17 142/78       Assessment  FORMULATION: Patient's mood is relatively stable except for the increased mood irritability which old be related to coming off the Lyrica. She also has some family issues which may be contributing as her mother is depressed.  Writer agreed with increasing the Cymbalta again to see if that helps with the pain from her fibromyalgia.      Recommendations/Plan and Rationale  PLAN: Increase Cymbalta to 30 mg bid.  Continue other medications.  Return in 6 weeks.

## 2017-08-19 ENCOUNTER — Ambulatory Visit
Payer: Medicare (Managed Care) | Attending: Pulmonary and Critical Care Medicine | Admitting: Pulmonary and Critical Care Medicine

## 2017-08-19 ENCOUNTER — Other Ambulatory Visit: Payer: Self-pay

## 2017-08-19 ENCOUNTER — Encounter: Payer: Self-pay | Admitting: Pulmonary and Critical Care Medicine

## 2017-08-19 VITALS — BP 110/82 | HR 110 | Temp 97.7°F | Resp 16 | Ht 68.0 in | Wt 273.8 lb

## 2017-08-19 DIAGNOSIS — J45901 Unspecified asthma with (acute) exacerbation: Secondary | ICD-10-CM

## 2017-08-19 MED ORDER — AZITHROMYCIN 250 MG PO TABS *I*
ORAL_TABLET | ORAL | 0 refills | Status: DC
Start: 2017-08-19 — End: 2017-09-30

## 2017-08-19 MED ORDER — PREDNISONE 20 MG PO TABS *I*
ORAL_TABLET | ORAL | 0 refills | Status: DC
Start: 2017-08-19 — End: 2017-09-30

## 2017-08-19 MED ORDER — PSEUDOEPHEDRINE-GUAIFENESIN 60-600 MG PO TB12 *I*
1.0000 | ORAL_TABLET | Freq: Two times a day (BID) | ORAL | 0 refills | Status: DC | PRN
Start: 2017-08-19 — End: 2017-09-30

## 2017-08-19 NOTE — Progress Notes (Signed)
[facility] - Outpatient Progress Note    SUBJECTIVE    Kaylee Harris is a 42 y.o. female who presents, for Cough (green phlegm x 5 days) and Nasal Congestion    1. URI: started about 4 days ago with cough, chest congestion, has shortness of breath, wheezing, coughing has needed albuterol at least 5 x daily-last this pm  -worse s/s at night  -spouse is ill as well  -no fever/some chills, no sore throat, no body aches  -no nausea/vomiting/ diarrhea  -got flu shot    Patient's medications were reviewed and reconciled with the patient at visit today.        ROS: see HPI above    Current Outpatient Prescriptions   Medication Sig    DULoxetine (CYMBALTA) 30 MG DR capsule Take 1 capsule (30 mg total) by mouth 2 times daily    glucagon (GLUCAGEN) 1 MG injection kit Inject 1 mg into the skin once as needed for Low blood sugar   Discard any unused portion.    busPIRone (BUSPAR) 15 MG tablet Take 2 tablets (30 mg total) by mouth 2 times daily    clonazePAM (KLONOPIN) 0.5 MG tablet Take 1 tablet (0.5 mg total) by mouth daily as needed   Max daily dose: 0.5 mg    ibuprofen (ADVIL,MOTRIN) 600 MG tablet Take 1 tablet (600 mg total) by mouth 3 times daily as needed for Pain    blood glucose test strip Test  8 times a day.   Brand name of strips Freestyle BG test strips for linked omnipod pump    lidocaine (LIDODERM) 5 % patch Place 1 patch onto the skin every 24 hours   Remove & discard patch within 12 hours or as directed.    mirtazapine (REMERON) 15 MG tablet TAKE 1 TABLET BY MOUTH NIGHTLY    lamoTRIgine (LAMICTAL) 150 MG tablet Take 1 tablet (150 mg total) by mouth daily    Insulin Disposable Pump (OMNIPOD DASH SYSTEM) KIT By 1 kit no specified route as needed    Insulin Disposable Pump (OMNIPOD DASH 5 PACK) MISC By 1 Device no specified route as needed    promethazine (PHENERGAN) 25 MG tablet Take 1 tablet (25 mg total) by mouth every 4-6 hours as needed for Nausea    cetirizine (ZYRTEC) 10 MG tablet  Take 1 tablet (10 mg total) by mouth daily    topiramate (TOPAMAX) 100 MG tablet Take 1 tablet (100 mg total) by mouth 2 times daily    pantoprazole (PROTONIX) 40 MG EC tablet Take 1 tablet (40 mg total) by mouth daily   SWALLOW WHOLE. DO NOT CRUSH, BREAK, OR CHEW.    Alcohol Swabs (ALCOHOL PREP) PADS Use BID for BG checkUse BID for BG check    traMADol (ULTRAM) 50 MG tablet Take 1 tablet (50 mg total) by mouth every 8 hours as needed (for head pain and abd pain)   Max daily dose: 150 mg    insulin lispro 100 UNIT/ML injection vial Inject as directed up to MDD 100 units    SUMAtriptan (IMITREX) 50 MG tablet TAKE 1 TABLET BY MOUTH AS NEEDED FOR MIGRAINE, TAKE AT ONSET OF HEADACHE. MAY REPEAT ONCE IN 2 HOURS    TRULICITY 1.5 OZ/3.6UY injection pen INJECT THE CONTENTS OF ONE PEN (0.5ML) INTO THE SKIN EVERY 7 DAYS    fluticasone (FLONASE) 50 MCG/ACT nasal spray 1 spray by Nasal route daily    TOUJEO SOLOSTAR 300 UNIT/ML SOPN INJECT 30 UNITS  INTO THE SKIN NIGHTLY    SUMAVEL DOSEPRO 6 MG/0.5ML needle-free injection INJECT 0.5MLS (6MG TOTAL) UNDER THE SKIN AS NEEDED FOR MIGRAINE MAY REPEAT ONCE AFTER 1 HOUR IF NEEDED    atorvastatin (LIPITOR) 40 MG tablet Take 1 tablet (40 mg total) by mouth daily (with dinner)    albuterol HFA 108 (90 Base) MCG/ACT inhaler Inhale 1-2 puffs into the lungs every 6 hours as needed for Wheezing   Shake well before each use.    phenazopyridine (PYRIDIUM) 100 MG tablet Take 1 tablet (100 mg total) by mouth every 8 hours as needed    famciclovir (FAMVIR) 500 MG tablet Take 1 tablet (500 mg total) by mouth 3 times daily as needed    dulaglutide (TRULICITY) 1.5 FX/9.0WI injection pen Inject 0.5 mLs (1.5 mg total) into the skin every 7 days    metoprolol (TOPROL-XL) 25 MG 24 hr tablet Take by mouth daily       ascorbic acid (VITAMIN C) 100 MG tablet Take 100 mg by mouth daily    midodrine (PROAMATINE) 10 MG tablet Take 1 tablet (10 mg total) by mouth 3 times daily    insulin  syringe-needle U-100 (BD ULTRAFINE) 31G X 5/16" 0.3 ML HALF-UNIT Use 3 times a day as instructed.    lancets Brand Free Style Lite; Use 2 times per day as directed for blood glucose testing.    levonorgestrel (MIRENA) 20 MCG/24HR IUD 1 each by Intrauterine route once    Non-System Medication The above patient is followed in our clinic and cannot resume work permanently.         OBJECTIVE  Vitals Reviewed:  BP 110/82    Pulse 110    Temp 36.5 C (97.7 F) (Temporal)    Resp 16    Ht 1.727 m (_0 )    Wt 124.2 kg (273 lb 12.8 oz)    SpO2 96%    BMI 41.63 kg/m     Wt Readings from Last 3 Encounters:   08/19/17 124.2 kg (273 lb 12.8 oz)   08/14/17 124.7 kg (275 lb)   08/07/17 126.1 kg (278 lb 1.6 oz)            GENERAL: A&Ox3, pleasant, cooperative, well groomed, appropriately dressed, actively engaged in encounter, NAD.  ENT: TMs and canals clear bilat, nasal mucosa clear, oropharynx moist with no oropharyngeal edema or erythema present; no oral lesions, exudate, or tonsillar enlargement. No frontal or maxillary sinus discomfort or pain with palpation.   LYMPH: supple, no cervical or supraclavicular lymphadenopathy  CARDIOVASCULAR: RR, S1 S2, no murmers, gallops, rubs.  RESPIRATORY: RRR, unlabored. Distant, no adventitious breath sounds-has used her albuterol numerous times today, last just prior to exam, full and symmetrical lung expansion.      ASSESSMENT & PLAN    1. Exacerbation of asthma, unspecified asthma severity, unspecified whether persistent  -acute  - predniSONE (DELTASONE) 20 MG tablet; Take 2 tab x 2 days, take 1 tab x 2 days, 1/2 tab x 2 days  Dispense: 7 tablet; Refill: 0  - azithromycin (ZITHROMAX) 250 MG tablet; Take 2 tablets on the first day and then 1 tablet daily.  Dispense: 6 tablet; Refill: 0  - pseudoephedrine-guaifenesin (MUCINEX D) 60-600 MG per 12 hr tablet; Take 1 tablet by mouth 2 times daily as needed for Congestion  Dispense: 28 tablet; Refill: 0  -advised to adjust her insulin  accordingly to increased BGs r/t prednisone    -push fluids, rest  -use humidifier at  night for cough  -Mucinex for congestion, Tylenol or Motrin for fever  -pract ice good hand-washing, cover cough  -f/u in 10 days to 2 weeks, sooner  as needed    Health Maintenance    Health Maintenance   Topic Date Due    Cervical Cancer Screening  10/14/2016    Breast Cancer Screening Other  11/08/2017    HEMOGLOBIN A1C  11/10/2017    URINE MICROALBUMIN  04/23/2018    LIPID DISORDER SCREENING  05/13/2018    FOOT EXAM  07/10/2018    DEPRESSION SCREEN YEARLY  08/15/2018    OPHTHALMOLOGY EXAM  03/20/2019    HIV TESTING OFFERED  Addressed    IMM-INFLUENZA  Completed       Patient verbalized understanding of information presented,and confirmed agreement with current plan of care.  Reviewed risks, benefits, and administration of prescribed/refilled medications. Precautions reviewed.    MyChart:  Activated [1]  Health Care Proxy:

## 2017-08-21 MED ORDER — DULAGLUTIDE 1.5 MG/0.5ML SC SOAJ *I*
1.5000 mg | SUBCUTANEOUS | 3 refills | Status: DC
Start: 2017-08-21 — End: 2018-08-20

## 2017-09-02 ENCOUNTER — Ambulatory Visit: Payer: Medicare (Managed Care) | Admitting: Pulmonary and Critical Care Medicine

## 2017-09-02 ENCOUNTER — Ambulatory Visit: Payer: Medicare (Managed Care) | Admitting: Psychiatry

## 2017-09-03 ENCOUNTER — Ambulatory Visit: Payer: Medicare (Managed Care) | Attending: Psychiatry

## 2017-09-03 DIAGNOSIS — F3341 Major depressive disorder, recurrent, in partial remission: Secondary | ICD-10-CM | POA: Insufficient documentation

## 2017-09-03 DIAGNOSIS — F4312 Post-traumatic stress disorder, chronic: Secondary | ICD-10-CM

## 2017-09-03 DIAGNOSIS — F422 Mixed obsessional thoughts and acts: Secondary | ICD-10-CM | POA: Insufficient documentation

## 2017-09-03 DIAGNOSIS — F603 Borderline personality disorder: Secondary | ICD-10-CM | POA: Insufficient documentation

## 2017-09-03 NOTE — BH Treatment Plan (Signed)
[facility] Treatment Plan     Date of Plan:   Created/Updated On 09/03/2017   FROM 09/03/2017      Created/Updated On 09/03/2017   Next Treatment Plan Due 12/03/2017       Diagnostic Impression    ICD-10-CM ICD-9-CM   1. Mixed obsessional thoughts and acts F42.2 300.3   2. Borderline personality disorder F60.3 301.83   3. 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 Chronic post-traumatic stress disorder (PTSD) F43.12 309.81       Strengths  Strengths derived from the assessment include: Patient is hard working, has supports.    Problem Areas  *At least one problem must be targeted toward risk reduction if Formulation of Risk or any other previous exam indicated special monitoring or intervention for suicide and/or violence risk indicated.    PROBLEM AREAS (choose and describe relevant):  THOUGHT: Negative  MOOD:Depressed and anxious  BEHAVIOR: Reactive and avoidant  ECONOMIC:SSDI  ________________________________________________________________  Treatment Problem #1 09/03/2017   Patient Identified Problem OCD        Treatment Goal #1 09/03/2017   Patient Identified Goal Work to reduce obsessional thoughts and actions       The rationale for addressing this problem is that resolving it will (select all that apply):  Reduce symptoms of disorder, Reduce functional impairment associated with disorder, Decrease likelihood of hospitalization, Facilitate transfer skills learned in therapy to everday life, Is a key motivational factor for the patient's participation in treatment, Reduce risk for suicide* and Reduce risk for violence*      Progress toward goal(s): Problem resolving. Comment: Patient reports continued stable mood state. Patient continues to work to address chronic medical issues and is following recommendations. Patient has made some attempt to reduce OCD behaviors. Patient continues to endorse PTSD symptoms but has not committed to working on skills to reduce these.  Patient is not on the Suicide Care Pathway.    1 a. Measurable Objectives : Patient will make and keep regular individual therapy appointments.   Date established: 05/31/14   Target date: 01/03/18   Attained or Revised? continue    1 b. Measurable Objectives : Patient will continue to learn and utilize skills to manage affect.   Date established: 05/31/14   Target date: 01/03/18   Attained or Revised? continue    ______________________________________________________________________      Plan  TREATMENT MODALITIES:  Individual psychotherapy for 30-60 min Q 1-3 weeks with Tresa Res, LCSW.  Psychopharmacology visits 20-45 min Q 2-8 weeks with [name], NP.     DISCHARGE CRITERIA for this treatment setting: Patient reports a reduction in symptoms as evidenced by PHQ-9 and GAD-7 scores.    Clinician's name: Tresa Res, LCSW    Psychiatrist's Name: Donny Pique, MD      Patient/Family Statement  PATIENT/FAMILY STATEMENT:  Obtain patient and family input into the treatment plan, including areas of agreement / disagreement.  Obtain patient's signature - if not possible, briefly describe the reason.     Patient Comments:          I HAVE PARTICIPATED IN THE DEVELOPMENT OF THIS TREATMENT PLAN AND I AGREE WITH ITS CONTENTS:       Patient Signature: _______________________________________________________    Date: ______________________________
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
## 2017-09-25 ENCOUNTER — Other Ambulatory Visit: Payer: Self-pay | Admitting: Primary Care

## 2017-09-25 ENCOUNTER — Encounter: Payer: Self-pay | Admitting: Gastroenterology

## 2017-09-25 ENCOUNTER — Other Ambulatory Visit: Payer: Self-pay | Admitting: Psychiatry

## 2017-09-25 DIAGNOSIS — G43909 Migraine, unspecified, not intractable, without status migrainosus: Secondary | ICD-10-CM

## 2017-09-25 LAB — HM DIABETES EYE EXAM

## 2017-09-25 NOTE — Telephone Encounter (Signed)
Last appt: 08/19/2017  Next appt:  10/20/2017    Recent Lab Values 03/31/2017 10/29/2016 10/17/2016 09/25/2016 01/18/2016 01/05/2016 01/04/2016   EXP DATE 04/04/18 04/04/2017 08/03/17 03/2017 - 06/2016 8-19   [facility] - - - - NEG - -

## 2017-09-26 ENCOUNTER — Encounter: Payer: Self-pay | Admitting: Gastroenterology

## 2017-09-26 ENCOUNTER — Telehealth: Payer: Self-pay | Admitting: Psychiatry

## 2017-09-26 NOTE — Telephone Encounter (Signed)
ORDER UPDATED FAXED TO [facility]

## 2017-09-30 ENCOUNTER — Telehealth: Payer: Self-pay

## 2017-09-30 ENCOUNTER — Encounter: Payer: Self-pay | Admitting: Primary Care

## 2017-09-30 ENCOUNTER — Encounter: Payer: Self-pay | Admitting: Psychiatry

## 2017-09-30 ENCOUNTER — Ambulatory Visit: Payer: Medicare (Managed Care) | Admitting: Psychiatry

## 2017-09-30 ENCOUNTER — Encounter: Payer: Self-pay | Admitting: Physical Medicine and Rehabilitation

## 2017-09-30 VITALS — BP 131/82 | HR 74 | Resp 18 | Ht 67.99 in | Wt 275.0 lb

## 2017-09-30 DIAGNOSIS — F422 Mixed obsessional thoughts and acts: Secondary | ICD-10-CM

## 2017-09-30 DIAGNOSIS — F3341 Major depressive disorder, recurrent, in partial remission: Secondary | ICD-10-CM

## 2017-09-30 DIAGNOSIS — F4312 Post-traumatic stress disorder, chronic: Secondary | ICD-10-CM

## 2017-09-30 DIAGNOSIS — F603 Borderline personality disorder: Secondary | ICD-10-CM

## 2017-09-30 NOTE — Progress Notes (Addendum)
Behavioral Health Psychopharmacology Follow-up     Length of Session: 15 minutes.    Diagnosis Addressed    ICD-10-CM ICD-9-CM   1. Mixed obsessional thoughts and acts F42.2 300.3   2. Borderline personality disorder F60.3 301.83   3. 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 Chronic post-traumatic stress disorder (PTSD) F43.12 309.81     Chief Complaint:  "It's doing okay."    Recent History and Response to Medications  Patient states: She still has pain and has really noticed any improvement with the increase in Cymbalta.  She says she her mood was a little better before the Cymbalta was increased. Patient says the OCD is still an issue.  We rearranged the house so now the curtains are driving me crazy again."  Patient says she and her therapist have been talking about discharge.  She says she feels okay and would like to try being out of therapy.    HPI     Follow-up    Additional comments: patient is here to follow up on her medications       Last edited by Shirlee More, LPN on 05/30/5807 98:33 AM. (History)          Current use of alcohol or drugs: No        Neurovegetative Symptoms Review:  Energy level: fair  Concentration: fair  Sleep Quality: poor   Number of hours :  (5)  Appetite: poor     Wt Readings from Last 3 Encounters:   09/30/17 124.7 kg (275 lb)   08/19/17 124.2 kg (273 lb 12.8 oz)   08/14/17 124.7 kg (275 lb)     Enjoyment/interest: fair    Review of Systems:  Patient denies any recent medical issues.     Current Medications  Current Outpatient Prescriptions   Medication Sig    SUMAtriptan refill (IMITREX STATDOSE) 6 MG/0.5ML injection INJECT 0.5ML UNDER THE SKIN ONCE AS NEEDED FOR MIGRAINE. MAY REPEAT DOSE ONCE AFTER 1 HOUR    dulaglutide (TRULICITY) 1.5 AS/5.0NL injection pen Inject 0.5 mLs (1.5 mg total) into the skin every 7 days    DULoxetine (CYMBALTA) 30 MG DR capsule Take 1 capsule (30 mg total) by mouth 2 times daily    glucagon (GLUCAGEN)  1 MG injection kit Inject 1 mg into the skin once as needed for Low blood sugar   Discard any unused portion.    busPIRone (BUSPAR) 15 MG tablet Take 2 tablets (30 mg total) by mouth 2 times daily    clonazePAM (KLONOPIN) 0.5 MG tablet Take 1 tablet (0.5 mg total) by mouth daily as needed   Max daily dose: 0.5 mg    ibuprofen (ADVIL,MOTRIN) 600 MG tablet Take 1 tablet (600 mg total) by mouth 3 times daily as needed for Pain    blood glucose test strip Test  8 times a day.   Brand name of strips Freestyle BG test strips for linked omnipod pump    mirtazapine (REMERON) 15 MG tablet TAKE 1 TABLET BY MOUTH NIGHTLY    lamoTRIgine (LAMICTAL) 150 MG tablet Take 1 tablet (150 mg total) by mouth daily    Insulin Disposable Pump (OMNIPOD DASH SYSTEM) KIT By 1 kit no specified route as needed    Insulin Disposable Pump (OMNIPOD DASH 5 PACK) MISC By 1 Device no specified route as needed    promethazine (PHENERGAN) 25 MG tablet Take 1 tablet (25 mg total) by mouth every 4-6 hours as needed  for Nausea    cetirizine (ZYRTEC) 10 MG tablet Take 1 tablet (10 mg total) by mouth daily    topiramate (TOPAMAX) 100 MG tablet Take 1 tablet (100 mg total) by mouth 2 times daily    pantoprazole (PROTONIX) 40 MG EC tablet Take 1 tablet (40 mg total) by mouth daily   SWALLOW WHOLE. DO NOT CRUSH, BREAK, OR CHEW.    Alcohol Swabs (ALCOHOL PREP) PADS Use BID for BG checkUse BID for BG check    traMADol (ULTRAM) 50 MG tablet Take 1 tablet (50 mg total) by mouth every 8 hours as needed (for head pain and abd pain)   Max daily dose: 150 mg    insulin lispro 100 UNIT/ML injection vial Inject as directed up to MDD 100 units    SUMAtriptan (IMITREX) 50 MG tablet TAKE 1 TABLET BY MOUTH AS NEEDED FOR MIGRAINE, TAKE AT ONSET OF HEADACHE. MAY REPEAT ONCE IN 2 HOURS    fluticasone (FLONASE) 50 MCG/ACT nasal spray 1 spray by Nasal route daily    TOUJEO SOLOSTAR 300 UNIT/ML SOPN INJECT 30 UNITS INTO THE SKIN NIGHTLY    SUMAVEL DOSEPRO 6  MG/0.5ML needle-free injection INJECT 0.5MLS (6MG TOTAL) UNDER THE SKIN AS NEEDED FOR MIGRAINE MAY REPEAT ONCE AFTER 1 HOUR IF NEEDED    atorvastatin (LIPITOR) 40 MG tablet Take 1 tablet (40 mg total) by mouth daily (with dinner)    albuterol HFA 108 (90 Base) MCG/ACT inhaler Inhale 1-2 puffs into the lungs every 6 hours as needed for Wheezing   Shake well before each use.    phenazopyridine (PYRIDIUM) 100 MG tablet Take 1 tablet (100 mg total) by mouth every 8 hours as needed    famciclovir (FAMVIR) 500 MG tablet Take 1 tablet (500 mg total) by mouth 3 times daily as needed    metoprolol (TOPROL-XL) 25 MG 24 hr tablet Take by mouth daily       ascorbic acid (VITAMIN C) 100 MG tablet Take 100 mg by mouth daily    midodrine (PROAMATINE) 10 MG tablet Take 1 tablet (10 mg total) by mouth 3 times daily    insulin syringe-needle U-100 (BD ULTRAFINE) 31G X 5/16" 0.3 ML HALF-UNIT Use 3 times a day as instructed.    lancets Brand Free Style Lite; Use 2 times per day as directed for blood glucose testing.    levonorgestrel (MIRENA) 20 MCG/24HR IUD 1 each by Intrauterine route once    Non-System Medication The above patient is followed in our clinic and cannot resume work permanently.     No current facility-administered medications for this visit.        Side Effects  Patient Reported Side Effects: None reported    Mental Status  APPEARANCE: Casual  ATTITUDE TOWARD INTERVIEWER: Cooperative  MOTOR ACTIVITY: WNL (within normal limits)  EYE CONTACT: Direct  SPEECH: Normal rate and tone  AFFECT: Full Range and Appropriate  MOOD: Euthymic  THOUGHT PROCESS: Normal  THOUGHT CONTENT: No unusual themes  PERCEPTION: Within normal limits  ORIENTATION: Alert and Oriented X 3.  CONCENTRATION: Fair  MEMORY:   Recent: intact   Remote: intact  COGNITIVE FUNCTION: Average intelligence  JUDGMENT: Intact  IMPULSE CONTROL: Fair  INSIGHT: Good and Fair    Risk Assessment    Self Injury: Patient Denies  Suicidal Ideation: Patient  Denies  Homicidal Ideation: Patient Denies  Aggressive Behavior: Patient Denies    Results  GAD-7 Dates 09/30/2017 08/14/2017 06/09/2017 05/22/2017 02/24/2017 01/20/2017 12/16/2016   Total Score  _0 Recent Review Flowsheet Data     PHQ Scores 09/30/2017 08/14/2017 06/09/2017 05/22/2017 04/23/2017 02/24/2017 01/20/2017    PSQ2 Q1 - Interest/Pleasure - - - - N - -    PSQ2 Q2 - Down, Depressed, Hopeless - - - - N - -    PHQ Q9 - Better Off Dead 0 0 0 0 - 0 0    PHQ Calculated Score _1 - 4 6          BP Readings from Last 3 Encounters:   09/30/17 131/82   08/19/17 110/82   08/14/17 124/58       Assessment  FORMULATION: Patient's impression an it is supported by the assessments is that she was doing better on the lower dose of the Cymbalta.  We discussed lowering the dose back to 30 mg nightly to which she agreed. Writer will not make any change in her Buspar, Lamictal or Remeron as they seem to be helping with her mood stability.  She says she has only used the Klonopin "a couple times" so is using it appropriately.  We discussed transferring her medications to her PCP to which she agreed.  She is welcome to come back to treatment if needed.      Recommendations/Plan and Rationale  PLAN: Decrease Cymbalta to 30 mg nightly.  Contact PCP about prescribing patient's psych meds.

## 2017-09-30 NOTE — Telephone Encounter (Signed)
Clinical Management Note   Writer received a message from patient regarding closing her episode of care. Writer called patient and talked with her about her current mood stability. Patient said that she thought she would be ok with ending this episode of care and not needing her next scheduled appointment. Writer agreed that patient has been managing stressors well and advised patient that she could return if needed.

## 2017-09-30 NOTE — Progress Notes (Signed)
Clinical Management Note     [name], MD  Jeffren Dombek, Suann Larry, NP            Sure , thank you for helping her and being available as needed in the future!    Previous Messages      ----- Message -----   From: Garry Heater, NP   Sent: 09/30/2017 12:03 PM   To: [name], MD     Kaylee Harris is ready for discharge for now from psych. She can come back if needed either for crisis visits, a med consult or another episode of care if needed. Will you prescribe her psych meds?     Kaylee Harris

## 2017-10-06 ENCOUNTER — Encounter: Payer: Self-pay | Admitting: Primary Care

## 2017-10-06 DIAGNOSIS — F603 Borderline personality disorder: Secondary | ICD-10-CM

## 2017-10-06 DIAGNOSIS — F319 Bipolar disorder, unspecified: Secondary | ICD-10-CM

## 2017-10-06 MED ORDER — LAMOTRIGINE 150 MG PO TABS *I'
150.0000 mg | ORAL_TABLET | Freq: Every day | ORAL | 0 refills | Status: DC
Start: 2017-10-06 — End: 2017-12-22

## 2017-10-06 NOTE — Telephone Encounter (Signed)
Last appt: 08/19/2017  Next appt:  10/20/2017    Recent Lab Values 03/31/2017 10/29/2016 10/17/2016 09/25/2016 01/18/2016 01/05/2016 01/04/2016   EXP DATE 04/04/18 04/04/2017 08/03/17 03/2017 - 06/2016 8-19   [facility] - - - - NEG - -

## 2017-10-07 ENCOUNTER — Ambulatory Visit: Payer: Medicare (Managed Care)

## 2017-10-08 ENCOUNTER — Other Ambulatory Visit: Payer: Self-pay | Admitting: Psychiatry

## 2017-10-08 ENCOUNTER — Other Ambulatory Visit: Payer: Self-pay

## 2017-10-08 ENCOUNTER — Other Ambulatory Visit: Payer: Self-pay | Admitting: Primary Care

## 2017-10-08 MED ORDER — DULOXETINE HCL 30 MG PO CPEP *I*
30.0000 mg | DELAYED_RELEASE_CAPSULE | Freq: Two times a day (BID) | ORAL | 3 refills | Status: DC
Start: 2017-10-08 — End: 2018-07-27

## 2017-10-08 MED ORDER — MIRTAZAPINE 15 MG PO TABS *I*
15.0000 mg | ORAL_TABLET | Freq: Every evening | ORAL | 3 refills | Status: DC
Start: 2017-10-08 — End: 2018-07-27

## 2017-10-08 NOTE — Telephone Encounter (Signed)
Last appt: 08/19/2017  Next appt:  10/20/2017    Recent Lab Values 03/31/2017 10/29/2016 10/17/2016 09/25/2016 01/18/2016 01/05/2016 01/04/2016   EXP DATE 04/04/18 04/04/2017 08/03/17 03/2017 - 06/2016 8-19   [facility] - - - - NEG - -

## 2017-10-10 ENCOUNTER — Other Ambulatory Visit: Payer: Self-pay

## 2017-10-10 MED ORDER — PANTOPRAZOLE SODIUM 40 MG PO TBEC *I*
40.0000 mg | DELAYED_RELEASE_TABLET | Freq: Every day | ORAL | 5 refills | Status: DC
Start: 2017-10-10 — End: 2018-04-06

## 2017-10-14 ENCOUNTER — Ambulatory Visit: Payer: Medicare (Managed Care) | Admitting: Physical Medicine and Rehabilitation

## 2017-10-16 ENCOUNTER — Encounter: Payer: Self-pay | Admitting: Gastroenterology

## 2017-10-16 ENCOUNTER — Emergency Department
Admission: EM | Admit: 2017-10-16 | Discharge: 2017-10-17 | Disposition: A | Discharge status: Home / Self Care | Payer: Medicare (Managed Care) | Source: Ambulatory Visit | Attending: Emergency Medicine | Admitting: Emergency Medicine

## 2017-10-16 ENCOUNTER — Encounter: Payer: Self-pay | Admitting: Rehabilitative and Restorative Service Providers"

## 2017-10-16 ENCOUNTER — Emergency Department: Payer: Medicare (Managed Care)

## 2017-10-16 ENCOUNTER — Other Ambulatory Visit: Payer: Self-pay | Admitting: Cardiology

## 2017-10-16 DIAGNOSIS — R109 Unspecified abdominal pain: Secondary | ICD-10-CM

## 2017-10-16 DIAGNOSIS — Z87891 Personal history of nicotine dependence: Secondary | ICD-10-CM | POA: Insufficient documentation

## 2017-10-16 DIAGNOSIS — R1031 Right lower quadrant pain: Secondary | ICD-10-CM | POA: Insufficient documentation

## 2017-10-16 DIAGNOSIS — M5412 Radiculopathy, cervical region: Secondary | ICD-10-CM

## 2017-10-16 DIAGNOSIS — R110 Nausea: Secondary | ICD-10-CM

## 2017-10-16 DIAGNOSIS — Z3202 Encounter for pregnancy test, result negative: Secondary | ICD-10-CM | POA: Insufficient documentation

## 2017-10-16 DIAGNOSIS — I499 Cardiac arrhythmia, unspecified: Secondary | ICD-10-CM

## 2017-10-16 LAB — CBC AND DIFFERENTIAL
Baso # K/uL: 0.1 10*3/uL (ref 0.0–0.1)
Basophil %: 0.7 %
Eos # K/uL: 0.2 10*3/uL (ref 0.0–0.4)
Eosinophil %: 1.2 %
Hematocrit: 43 % (ref 34–45)
Hemoglobin: 13.8 g/dL (ref 11.2–15.7)
IMM Granulocytes #: 0.1 10*3/uL
IMM Granulocytes: 0.4 %
Lymph # K/uL: 4.4 10*3/uL — ABNORMAL HIGH (ref 1.2–3.7)
Lymphocyte %: 29.6 %
MCH: 28 pg (ref 26–32)
MCHC: 32 g/dL (ref 32–36)
MCV: 88 fL (ref 79–95)
Mono # K/uL: 0.7 10*3/uL (ref 0.2–0.9)
Monocyte %: 4.9 %
Neut # K/uL: 9.4 10*3/uL — ABNORMAL HIGH (ref 1.6–6.1)
Nucl RBC # K/uL: 0 10*3/uL (ref 0.0–0.0)
Nucl RBC %: 0 /100 WBC (ref 0.0–0.2)
Platelets: 403 10*3/uL — ABNORMAL HIGH (ref 160–370)
RBC: 4.9 MIL/uL (ref 3.9–5.2)
RDW: 13.5 % (ref 11.7–14.4)
Seg Neut %: 63.2 %
WBC: 14.8 10*3/uL — ABNORMAL HIGH (ref 4.0–10.0)

## 2017-10-16 MED ORDER — DEXTROSE 5 % FLUSH FOR PUMPS *I*
0.0000 mL/h | INTRAVENOUS | Status: DC | PRN
Start: 2017-10-16 — End: 2017-10-17

## 2017-10-16 MED ORDER — SODIUM CHLORIDE 0.9 % FLUSH FOR PUMPS *I*
0.0000 mL/h | INTRAVENOUS | Status: DC | PRN
Start: 2017-10-16 — End: 2017-10-17

## 2017-10-16 NOTE — Progress Notes (Signed)
[facility] Orthopedic Therapy Services  Discharge Summary      Kaylee Harris  [id number]  10/16/2017    This patient was seen in our Orthopedic Therapy rehabilitation department for care of neck - generalized.    SUMMARY OF TREATMENT:  Received care for 3 rehabilitation visits    Therapy treatment(s) included:  Ice/heat, Active spine protocol    REASON FOR DISCHARGE: Pt did not return for additional evaluation/treatment. Patient did not return for follow-up     The patient's last known condition (and any Medicare Functional Limitation Reporting prior to 05/06/17) is as per last date of assessment / treatment. Please refer to the patient's last re-evaluation and/or treatment for details.    Thank you for the referral of this patient to [facility].   The patient is welcome to return for rehabilitation at a future time if indicated.    Birdie Hopes, PT

## 2017-10-16 NOTE — ED Triage Notes (Signed)
Pt reports she is having pain in her RLQ abdominal pain that started at about 5pm today. Says the pain has been getting progressively worse. Says that she feels nauseous, denies emesis. Reports she feels like she is having heart palpitations and is also feeling lightheaded, which started after the pain intensified.        Triage Note   Kaylee Harris

## 2017-10-17 ENCOUNTER — Encounter: Payer: Self-pay | Admitting: Emergency Medicine

## 2017-10-17 ENCOUNTER — Telehealth: Payer: Self-pay | Admitting: Endocrinology

## 2017-10-17 ENCOUNTER — Emergency Department: Payer: Medicare (Managed Care)

## 2017-10-17 ENCOUNTER — Telehealth: Payer: Self-pay

## 2017-10-17 DIAGNOSIS — R109 Unspecified abdominal pain: Secondary | ICD-10-CM

## 2017-10-17 LAB — POCT URINALYSIS DIPSTICK
Blood,UA POCT: NEGATIVE
Glucose,UA POCT: NORMAL mg/dL
Ketones,UA POCT: NEGATIVE mg/dL
Leuk Esterase,UA POCT: NEGATIVE
Lot #: 35020902
Nitrite,UA POCT: NEGATIVE
PH,UA POCT: 6 (ref 5–8)
Protein,UA POCT: NEGATIVE mg/dL

## 2017-10-17 LAB — RUQ PANEL (ED ONLY)
ALT: 17 U/L (ref 0–35)
AST: 14 U/L (ref 0–35)
Albumin: 4.7 g/dL (ref 3.5–5.2)
Alk Phos: 111 U/L — ABNORMAL HIGH (ref 35–105)
Amylase: 46 U/L (ref 28–100)
Bilirubin,Direct: <0.2 mg/dL (ref 0.0–0.3)
Bilirubin,Total: 0.3 mg/dL (ref 0.0–1.2)
Globulin: 2.7 g/dL (ref 2.7–4.3)
Lipase: 42 U/L (ref 13–60)
Total Protein: 7.4 g/dL (ref 6.3–7.7)

## 2017-10-17 LAB — EKG 12-LEAD
P: 32 deg
PR: 198 ms
QRS: 70 deg
QRSD: 89 ms
QT: 401 ms
QTc: 448 ms
Rate: 75 {beats}/min
T: 77 deg

## 2017-10-17 LAB — BASIC METABOLIC PANEL
Anion Gap: 15 (ref 7–16)
CO2: 21 mmol/L (ref 20–28)
Calcium: 9.5 mg/dL (ref 8.8–10.2)
Chloride: 103 mmol/L (ref 96–108)
Creatinine: 0.9 mg/dL (ref 0.51–0.95)
GFR,Black: 90 *
GFR,Caucasian: 78 *
Glucose: 120 mg/dL — ABNORMAL HIGH (ref 60–99)
Lab: 13 mg/dL (ref 6–20)
Potassium: 4.1 mmol/L (ref 3.3–5.1)
Sodium: 139 mmol/L (ref 133–145)

## 2017-10-17 LAB — POCT URINE PREGNANCY: Lot #: 8060102

## 2017-10-17 LAB — LACTATE, VENOUS, WHOLE BLOOD: Lactate VEN,WB: 1 mmol/L (ref 0.5–2.2)

## 2017-10-17 MED ORDER — ONDANSETRON HCL 2 MG/ML IV SOLN *I*
4.0000 mg | Freq: Once | INTRAMUSCULAR | Status: AC
Start: 2017-10-17 — End: 2017-10-17
  Administered 2017-10-17: 4 mg via INTRAVENOUS
  Filled 2017-10-17: qty 2

## 2017-10-17 MED ORDER — HYDROMORPHONE HCL PF 1 MG/ML IJ SOLN *WRAPPED*
1.0000 mg | Freq: Once | INTRAMUSCULAR | Status: AC
Start: 2017-10-17 — End: 2017-10-17
  Administered 2017-10-17: 1 mg via INTRAVENOUS
  Filled 2017-10-17: qty 1

## 2017-10-17 MED ORDER — SODIUM CHLORIDE 0.9 % IV SOLN WRAPPED *I*
1000.0000 mL | Freq: Once | Status: AC
Start: 2017-10-17 — End: 2017-10-17
  Administered 2017-10-17: 1000 mL via INTRAVENOUS

## 2017-10-17 MED ORDER — STERILE WATER FOR IRRIGATION IR SOLN *I*
900.0000 mL | Freq: Once | Status: AC
Start: 2017-10-17 — End: 2017-10-17
  Administered 2017-10-17: 900 mL via ORAL

## 2017-10-17 MED ORDER — IOHEXOL 350 MG/ML (OMNIPAQUE) IV SOLN *I*
1.0000 mL | Freq: Once | INTRAVENOUS | Status: AC
Start: 2017-10-17 — End: 2017-10-17
  Administered 2017-10-17: 150 mL via INTRAVENOUS

## 2017-10-17 NOTE — Discharge Instructions (Signed)
Your CT scan is reassuring.  Alternate tylenol and ibuprofen for pain.  Sit in a warm bath or use heat packs for pain.  Stay on a clear diet for the next 24 hours. Advance slowly.

## 2017-10-17 NOTE — Telephone Encounter (Signed)
Please tell pt to make sure she stays hydrated, clear liquids-advance diet as tolerated (bland foods), watch for fever/chills, worsening pain, blood in the stool/vomit-if that occurs she needs to return to the ED, if tolerable s/s over the WE, she has appointment with PCP on Mo. She can continue the phenergan for nausea/vomiting.  NG

## 2017-10-17 NOTE — Telephone Encounter (Signed)
Faxed [facility] order to [facility]

## 2017-10-17 NOTE — Telephone Encounter (Addendum)
Spoke with pt, she said yesterday she was having severe abdominal pain and nausea. She went to the ED and had a CAT scan and Xray done and they told pt she was constipated. She said since 7 am this morning she has vomited bile 3 times, last one was 20 minutes ago. She said she had a normal bowel movement this morning at 9 am, but she said she gets diarrhea all the time.     She said the ED recommended pt be on a clear diet. She said she hasn't eaten anything today but has been drinking water and still urinating. Pt just took phenergan for her nausea just now so she is not sure if it is helping yet or not.

## 2017-10-17 NOTE — Telephone Encounter (Signed)
Pt.notified

## 2017-10-17 NOTE — Telephone Encounter (Signed)
Patient called because she states that she was in The Emergency Department at [facility] last night and was discharged home because they did not find anything and told patient that she was constipated and patient is requesting Dr. Laurance Flatten to assist her because patient is reporting that her condition is worsening. Patient reports that she is experiencing more abdominal pain and is mow vomiting stomach bile. There are no more available appointments at this time. Please Advise. Patient can be reached at [phone] as this was verified as the best number to reach her.

## 2017-10-17 NOTE — ED Provider Notes (Signed)
History     Chief Complaint   Patient presents with    Abdominal Pain    Palpitations     Chief complaint: Abdominal pain  HPI: 42 year old female with a history of multiple abdominal surgeries including partial colectomy presents to emergency department with severe right lower quadrant pain since about 5 PM.  It is colicky in nature but very severe when it sets in.  It never feels mildly better.  She has had nausea but denies vomiting.  She believes her bowel movements have been regular for her which is loose at baseline.  She denies fevers or chills but has been cold.  She denies dysuria or vaginal discharge.        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 Lower abdomen with cellulitis changes, wound probed and purulent material expressed.  Had PICC line for "multiple infiltrations" of what? D/c-ed home on 10d of Augmentin 9/11.      Anginal pain     Anxiety     Arthritis     Asthma     Depression     Diabetes mellitus     Previously on SU and metformin, now diet controlled    Diverticulitis 09/2010    Dysfunctional uterine bleeding     Fibromyalgia     GERD (gastroesophageal reflux disease)     GERD (gastroesophageal reflux disease) 10/04/2016    High blood pressure     Hyperlipidemia     Liver disease     fatty liver     Long term (current) use of insulin, Dermal Adhesed V-Go Insulin Delivery Device 10/04/2016    Migraine     Neuromuscular disorder     POTS (postural orthostatic tachycardia syndrome)     Sebaceous cyst of breast     right axilla    TMJ click, left 1/0/2725    Varicella         Patient Active Problem List   Diagnosis Code    DM (diabetes mellitus), type 2, uncontrolled E11.65    Migraine with aura G43.109    Asthma J45.909    Hyperlipemia E78.5    Fibromyalgia M79.7    Syncope and collapse-  recurrent, working diagnosis vascular vs psychogenic. 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1    Traumatic hemorrhage of right cerebrum G38.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    Diverticulitis K57.92    Long term (current) use of insulin, Dermal Adhesed V-Go Insulin Delivery Device Z79.4    Morbid obesity with BMI of 40.0-44.9, adult E66.01, Z68.41  Hematuria R31.9    Right hip pain M25.551    Labial abscess N76.4    Labial cyst N90.7    Low back pain M54.5    Lumbar facet arthropathy M47.816            Past Surgical History:   Procedure Laterality Date    APPENDECTOMY  2016    arthroscopic shoulder surgery Right 2010    Related to lifting    CARDIAC CATHETERIZATION  09/2011    negative    COLON SURGERY      DILATION AND CURETTAGE OF UTERUS  2005, 2007    x2 for menorraghia    LEFT COLECTOMY  01/03/14    Dr Tresa Res    loop recorder  Feb 03 2014    loop recorder removal      PR COLONOSCOPY THRU COLOTOMY N/A 02/16/2016    Procedure: COLONOSCOPY;  Surgeon: Kelly Splinter, MD;  Location: [place] [facility];  Service: GI    PR CYSTOURETHROSCOPY,BIOPSY N/A 10/17/2016    Procedure: CYSTOSCOPY BLADDER ;  Surgeon: Ed Blalock, MD;  Location: [place] [facility];  Service: Urology    PR EDG TRANSORAL BIOPSY SINGLE/MULTIPLE N/A 10/29/2016    Procedure: EGD;  Surgeon:  Kelly Splinter, MD;  Location: [place];  Service: GI    TONSILLECTOMY       Family History   Problem Relation Age of Onset    Hypertension Father     Diabetes Father     Kidney Disease Father     Elevated lipids Father     Heart attack Father 31    Other Father         PVD    Heart Disease Father     Hypertension Mother     Elevated lipids Mother     Heart attack Mother 15    Diabetes Mother     Eczema Mother     Psoriasis Mother     Heart Disease Mother     Hypertension Brother     Heart Disease Brother 12        prinzmetal's angina    Heart Disease Sister         currently having work up    Megargel Sister     Hypertension Sister     Breast cancer Maternal Grandmother     Stroke Other     Cancer Sister     Thyroid disease Sister     Anesth problems Neg Hx           Social History   Substance Use Topics    Smoking status: Former Smoker     Packs/day: 0.50     Years: 3.00     Types: Cigarettes    Smokeless tobacco: Never Used      Comment: quit age late 73s    Alcohol use 0.0 oz/week      Comment: 0-1 drink/ month     Living Situation     Questions Responses    Patient lives with Spouse    Comment: Lives with Wife, Shari     Homeless No    Caregiver for other family member No    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   Respiratory: Negative for apnea, cough, choking, chest tightness, shortness of breath, wheezing and stridor.    Cardiovascular: Negative for chest pain, palpitations and leg swelling.   Gastrointestinal: Positive for abdominal pain and nausea. Negative for constipation, diarrhea and vomiting.   Genitourinary: Negative for dysuria, flank pain, vaginal bleeding, vaginal discharge and vaginal pain.   Musculoskeletal: Negative for back  pain.   Skin: Negative for rash and wound.   Neurological: Negative for dizziness, weakness and light-headedness.       Physical Exam     Triage Vitals  Triage Start: Start, (10/16/17 2028)   First Recorded BP: 152/89, Resp: 20, Temp: 36.6 C (97.9 F), Temp src: TEMPORAL Oxygen Therapy SpO2: 99 %, Oximetry Source: Lt Hand, O2 Device: None (Room air), Heart Rate: 88, (10/16/17 2030)  .  First Pain Reported  0-10 Scale: 7, Pain Location/Orientation: Abdomen, Pain Descriptors: Spasm, (10/16/17 2030)       Physical Exam   Constitutional: She is oriented to person, place, and time. She appears well-developed. No distress.   HENT:   Head: Normocephalic and atraumatic.   Cardiovascular: Normal rate, regular rhythm, normal heart sounds and intact distal pulses.    Pulmonary/Chest: Effort normal and breath sounds normal. She has no wheezes. She has no rales.   Abdominal: Soft. Bowel sounds are normal. She exhibits no distension. There is tenderness (rlq). There is no rebound, no guarding and no CVA tenderness.   Musculoskeletal:   No long bone deformity   Neurological: She is alert and oriented to person, place, and time.   Skin: Skin is warm and dry. She is not diaphoretic.   Psychiatric: She has a normal mood and affect. Her behavior is normal. Judgment and thought content normal.   Nursing note and vitals reviewed.      Medical Decision Making        Initial Evaluation:  ED First Provider Contact     Date/Time Event User Comments    10/16/17 2307 ED First Provider Contact Patrick Sohm S Initial Face to Face Provider Contact          Patient seen by me on arrival date of 10/16/2017.    Assessment:  42 y.o.female comes to the ED with rlq pain      Differential Diagnosis includes:  Obstruction, colic, colitis, ovarian cyst    Plan: cbc, lytes, ruq, ua, upreg, IVFs, ct abd/pel, kub  Labs Reviewed   CBC AND DIFFERENTIAL - Abnormal; Notable for the following:        Result Value    WBC 14.8 (*)     Platelets 403 (*)     Neut  # K/uL 9.4 (*)     Lymph # K/uL 4.4 (*)     All other components within normal limits   BASIC METABOLIC PANEL - Abnormal; Notable for the following:     Glucose 120 (*)     All other components within normal limits   RUQ PANEL (ED ONLY) - Abnormal; Notable for the following:     Alk Phos 111 (*)     All other components within normal limits   LACTATE, VENOUS, WHOLE BLOOD   POCT URINALYSIS DIPSTICK   POCT URINE PREGNANCY     CT abdomen and pelvis with contrast   Final Result      Unremarkable enhanced CT of the abdominal pelvis. No bowel wall thickening. No bowel obstruction.      END  OF IMPRESSION      [facility] submits this DICOM format image data and final report to the [facility], an independent secure electronic health information exchange, on a reciprocally searchable basis (with patient authorization) for a minimum of 12 months after exam    date.      * Abdomen standard AP single view/KUB    (Results Pending)     CT is unremarkable. This is reassuring in the setting of multiple surgeries and elevated WBC. Pt given instructions to return with fevers or worsening symptoms. Ok to d/c       Micki Riley, MD          Micki Riley, MD  10/17/17 [phone]

## 2017-10-18 ENCOUNTER — Other Ambulatory Visit: Payer: Self-pay | Admitting: Psychiatry

## 2017-10-20 ENCOUNTER — Encounter: Payer: Self-pay | Admitting: Primary Care

## 2017-10-20 ENCOUNTER — Ambulatory Visit: Payer: Medicare (Managed Care) | Attending: Primary Care | Admitting: Primary Care

## 2017-10-20 ENCOUNTER — Other Ambulatory Visit
Admission: RE | Admit: 2017-10-20 | Discharge: 2017-10-20 | Disposition: A | Discharge status: Home / Self Care | Payer: Medicare (Managed Care) | Source: Ambulatory Visit | Attending: Primary Care | Admitting: Primary Care

## 2017-10-20 VITALS — BP 116/84 | HR 76 | Temp 98.1°F | Ht 68.0 in | Wt 270.6 lb

## 2017-10-20 DIAGNOSIS — E119 Type 2 diabetes mellitus without complications: Secondary | ICD-10-CM | POA: Insufficient documentation

## 2017-10-20 DIAGNOSIS — R1031 Right lower quadrant pain: Secondary | ICD-10-CM

## 2017-10-20 DIAGNOSIS — Z0000 Encounter for general adult medical examination without abnormal findings: Secondary | ICD-10-CM

## 2017-10-20 DIAGNOSIS — M47816 Spondylosis without myelopathy or radiculopathy, lumbar region: Secondary | ICD-10-CM

## 2017-10-20 DIAGNOSIS — IMO0002 Reserved for concepts with insufficient information to code with codable children: Secondary | ICD-10-CM

## 2017-10-20 DIAGNOSIS — E1165 Type 2 diabetes mellitus with hyperglycemia: Secondary | ICD-10-CM | POA: Insufficient documentation

## 2017-10-20 DIAGNOSIS — Z975 Presence of (intrauterine) contraceptive device: Secondary | ICD-10-CM

## 2017-10-20 DIAGNOSIS — D72829 Elevated white blood cell count, unspecified: Secondary | ICD-10-CM

## 2017-10-20 DIAGNOSIS — M797 Fibromyalgia: Secondary | ICD-10-CM

## 2017-10-20 LAB — CBC
Hematocrit: 42 % (ref 34–45)
Hemoglobin: 13.4 g/dL (ref 11.2–15.7)
MCH: 29 pg/cell (ref 26–32)
MCHC: 32 g/dL (ref 32–36)
MCV: 89 fL (ref 79–95)
Platelets: 397 10*3/uL — ABNORMAL HIGH (ref 160–370)
RBC: 4.7 MIL/uL (ref 3.9–5.2)
RDW: 13.2 % (ref 11.7–14.4)
WBC: 10.7 10*3/uL — ABNORMAL HIGH (ref 4.0–10.0)

## 2017-10-20 LAB — PCMH FALL RISK PLAN

## 2017-10-20 LAB — PCMH DEPRESSION ASSESSMENT

## 2017-10-20 LAB — MULTIPLE ORDERING DOCS

## 2017-10-20 LAB — PCMH FALL RISK ASSESSMENT

## 2017-10-20 NOTE — Patient Instructions (Addendum)
[email or web address]       Thank you for completing your Subsequent Annual Medicare Visit; Diabetes; Fibromyalgia; Loss of Consciousness; and Contraception (mirena due to be changed)   with [place] today.     The purpose of this visits was to:     Screen for disease   Assess risk of future medical problems   Help develop a healthy lifestyle   Update vaccines   Get to know your doctor in case of an illness    Patient Care Team:  [name], MD as PCP - General  Trilby Leaver, MD  [name], MD  [name], MD  Dimarsico, Katie as [facility] - Internal     Medicare 5 Year Plan    The following items were identified as areas of concern during your screening today:  BMI greater than 25 - This is a risk for Heart Attack, Stroke, High Blood Pressure, Diabetes, High Cholesterol and other complications.       The Health Maintenance table below identifies screening tests and immunizations recommended by your health care team:  Health Maintenance   Topic Date Due    Cervical Cancer Screening  10/14/2016    Breast Cancer Screening Other  11/08/2017    HEMOGLOBIN A1C  11/10/2017    URINE MICROALBUMIN  04/23/2018    LIPID DISORDER SCREENING  05/13/2018    FOOT EXAM  07/10/2018    DEPRESSION SCREEN YEARLY  10/01/2018    OPHTHALMOLOGY EXAM  09/26/2019    HIV TESTING OFFERED  Addressed    IMM-INFLUENZA  Completed     In addition, goals and orders placed to address these recommendations are listed in the "Today's Visit" section.    We wish you the best of health and look forward to seeing you again next year for your Annual Medicare Wellness Visit.     If you have any health care concerns before then, please do not hesitate to contact us.

## 2017-10-20 NOTE — Progress Notes (Signed)
Today we reviewed and updated Kaylee Harris smoking status, activities of daily living, depression screen, fall risk, medications and allergies.   I have counseled the patient in the above areas.     Subjective:     Chief Complaint: Kaylee Harris is a 42 y.o. female here for a/an Subsequent Annual Medicare Visit; Diabetes; Fibromyalgia; Loss of Consciousness; and Contraception (mirena due to be changed)    In general, Kaylee Harris rates their overall health as:  good    Patient Care Team:  Kaylee Drilling, MD as PCP - General  Trilby Leaver, MD  Kaylee Drilling, MD  Kaylee Drilling, MD  Dimarsico, Katie as [place] - Internal     Patient Active Problem List    Diagnosis Date Noted    At risk for abuse of opiates 06/29/2014     Priority: High     DO NOT GIVE IV NARCOTICS OR RX      History of repeated overdose 06/29/2014     Priority: High     DO NOT GIVE IV NARCOTICS      Borderline personality disorder 06/29/2014     Priority: High    Fibromyalgia 03/24/2013     Priority: High     Cymbalta -> lyrica 01/22/2017       Syncope and collapse- recurrent, working diagnosis vascular vs psychogenic. Non-arrythmic per Dr Lovena Le 10/2014 03/24/2013     Priority: High     Hospitalized in 09/2011 with angina, had normal cardiac catheterization then developed syncopal episodes and hypotension following this hospitalization.   24h holter 10/2011 which showed brief mobitz 1, +tilt table testing, treated with Florinef, compression stockings, fluids/salt liberalization.   Seemed to have improvement for ~1 yr, then recurrent syncope in 10/2012. Resolved with use of compression stockings and avoidance of gluten  Loop monitor placed 10/1 by [facility]; last seen 5/11 with normal sinus rhythm even during episodes. Transitioning off ranexa as of 09/13/13 to decrease medication burden  Nuclear stress test 7/27 negative. Transitioned from theophylline to midodrine 7/7    05/2014: taken off all meds due to escalating episodes and  no cardiac etiology. Felt to be psychogenic per discussion with Dr Kerin Ransom and [name] (psych). Given protective helmet and will focus more on psychiatric treatment/care. Both pt and wife are not in agreement with this diagnosis    07/2014: seen by neurologist and requested second opinion from different cardiologist and EEG. Underwent one time EEG (normal 3/24), if neg will pursue extended monitoring    09/05/14 referred to Dr Lovena Le for second opinion in cardiology  Repeat tilt tabel 09/28/14 + and suggestive of POTS although per Dr Tanna Furry notes "hemodynamics not c/w POTS"  10/2014: started on atenolol and midodrine, cogentin d/c-ed  02/07/2015: repeat evaluation by [name], [facility] cardiologist; recommended consideration of prolonged inpatient monitoring (such as [facility] inpatient neuro with camera monitoring, EEG and HR monitoring).  May need to consider migraine variant as a cause of syncope.  Cardiologist's comments on meds: better after stopping Cogentin, now on Abilify for bipolar, tolerating atenolol, increase midodrine to 3x/day, try salt tablets, support hose daily, continue abd binder.)  She does not have cardiac or arrhythmia problems.  Needs to continue intensive treatment with psychiatry and neurology.  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 sent to [facility] program (dismissed 06/2014 due to frequent abscenses)  6/16: cogentin and latuda d/c-ed , transitioned to abilify  06/2015: abilify decreased due to uncontrolled DM      DM (diabetes mellitus), type 2, uncontrolled 03/09/2009     Priority: High      Undergoing Lantus titration; previously intolerant of glipizide and metformin  10/14/2014 mealtime insulin  06/2015 trial of splitting lantus (no change), increasing lantus and humalog  07/2015 transitioned to GLP1 agonist and VGO      Migraine with aura 03/09/2009     Priority: Medium    Hyperlipemia 03/09/2009     Priority: Medium             IUD (intrauterine device) in place 03/24/2013     Priority: Low     10/29/11      Asthma 03/09/2009     Priority: Low     Created by Conversion        Lumbar facet arthropathy 07/03/2017     Chronic lumbar facet syndrome L4-5  Failed intra-articular injection  Pending median branch block protocol with plan towards pursuing radiofrequency ablation      Low back pain 04/18/2017     Moderate lumbar DJD on xray 12/18  MRI shows mild disc dessication and facet joint swelling at L4/5, no central stenosis or herniation  REcommended exercise  05/20/17 s/p b/l L4/5 facet joint injection      Labial cyst 03/30/2017    Labial abscess 03/28/2017    Right hip pain 02/25/2017     S/p TB injection 02/2017      Morbid obesity with BMI of 40.0-44.9, adult 10/17/2016    Hematuria 10/17/2016     Likely 2/2 bladder catheter trauma  Felt to have ?bladder mass on first cystoscopy. Repeat was neg for mass      GERD (gastroesophageal reflux disease) 40/34/7425    TMJ click, left 95/63/8756    Long term (current) use of insulin, Dermal Adhesed V-Go Insulin Delivery Device 10/04/2016    POTS (postural orthostatic tachycardia syndrome) 01/15/2016    Anxiety 01/14/2016    Bipolar 1 disorder 05/03/2015    Traumatic hemorrhage of right cerebrum 05/03/2015    Fatty liver disease, nonalcoholic 43/32/9518     Hepatomegaly (19cm) on 09/25/14 [place], normal LFTs      No diabetic retinopathy in both eyes 04/05/2014    Meralgia paresthetica of left side 10/06/2013    Mixed obsessional thoughts and acts 10/04/2013    Carpal tunnel syndrome 10/04/2013     EMG 09/2013 neg for denervation, R>L carpal  tunnel. Recommended splinting      IBS (irritable bowel syndrome) 05/31/2013     Seen in ED 5/21 and 22 with RUQ pain, [place] neg for gallstones, labs normal. US showed hepatomegaly with steatosis  Chronic diarrhea and pain since. 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 Lower abdomen with cellulitis changes, wound probed and purulent material expressed.  Had PICC line for "multiple infiltrations" of what? D/c-ed home on 10d of Augmentin 9/11.      Anginal pain     Anxiety     Arthritis     Asthma     Depression     Diabetes mellitus     Previously on SU and metformin, now diet controlled    Diverticulitis 09/2010    Dysfunctional uterine bleeding     Fibromyalgia     GERD (gastroesophageal reflux disease)     GERD (gastroesophageal reflux disease) 10/04/2016    High blood pressure     Hyperlipidemia     Liver disease     fatty liver     Long term (current) use of insulin, Dermal Adhesed V-Go Insulin Delivery Device 10/04/2016    Migraine     Neuromuscular disorder     POTS (postural orthostatic tachycardia syndrome)     Sebaceous cyst of breast     right axilla    TMJ click, left 6/0/7371    Varicella      Past Surgical History:   Procedure Laterality Date    APPENDECTOMY  2016    arthroscopic shoulder surgery Right 2010    Related to lifting    CARDIAC CATHETERIZATION  09/2011    negative    COLON SURGERY      DILATION AND CURETTAGE OF UTERUS  2005, 2007    x2 for menorraghia    LEFT COLECTOMY  01/03/14    Dr Tresa Res    loop recorder  Feb 03 2014    loop recorder removal       PR COLONOSCOPY THRU COLOTOMY N/A 02/16/2016    Procedure: COLONOSCOPY;  Surgeon: Kelly Splinter, MD;  Location: [place];  Service: GI    PR CYSTOURETHROSCOPY,BIOPSY N/A 10/17/2016    Procedure: CYSTOSCOPY BLADDER ;  Surgeon: Ed Blalock, MD;  Location[facility];  Service: Urology    PR EDG TRANSORAL BIOPSY SINGLE/MULTIPLE N/A 10/29/2016    Procedure: EGD;  Surgeon: Kelly Splinter, MD;  Location: [facility];  Service: GI    TONSILLECTOMY       Family History   Problem Relation Age of Onset    Hypertension Father     Diabetes Father     Kidney Disease Father     Elevated lipids Father     Heart attack Father 1    Other Father         PVD    Heart Disease Father     Hypertension Mother     Elevated lipids Mother     Heart attack Mother 45    Diabetes Mother     Eczema Mother     Psoriasis Mother     Heart Disease Mother     Hypertension Brother     Heart Disease Brother 79        prinzmetal's angina    Heart Disease Sister         currently having work up    [place] Sister     Hypertension Sister     Breast cancer Maternal Grandmother     Stroke Other     Cancer Sister     Thyroid disease Sister     Anesth problems Neg Hx      Social History  Social History    Marital status: Married     Spouse name: N/A    Number of children: N/A    Years of education: N/A     Occupational History    Not on file.     Social History Main Topics    Smoking status: Former Smoker     Packs/day: 0.50     Years: 3.00     Types: Cigarettes    Smokeless tobacco: Never Used      Comment: quit age late 65s    Alcohol use 0.0 oz/week      Comment: 0-1 drink/ month    Drug use: No    Sexual activity: Yes     Partners: Female     Patent examiner protection: IUD     Social History Narrative    ** Merged History Encounter **         Married to [place], recently relocated back to [place] from [place] due to family stressors. 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        Objective:     Vital Signs: BP 116/84    Pulse 76    Temp 36.7 C (98.1 F) (Temporal)    Ht 1.727 m (5\' 8" )    Wt 122.7 kg (270 lb 9.6 oz)    BMI 41.14 kg/m    BMI: Body mass index is 41.14 kg/m.    Vision Screening Results (Welcome visit only):  No exam data present    Depression Screening Results:  Recent Review Flowsheet Data     PHQ Scores 10/20/2017 09/30/2017 08/14/2017 06/09/2017 05/22/2017 04/23/2017 02/24/2017    PSQ2 Q1 - Interest/Pleasure N - - - - N -    PSQ2 Q2 - Down, Depressed, Hopeless N - - - - N -    PHQ Q9 - Better Off Dead - 0 0 0 0 - 0    PHQ Calculated Score - 7 4 9 5  - 4          Tobacco Use:  History   Smoking Status    Former Smoker    Packs/day: 0.50    Years: 3.00    Types: Cigarettes   Smokeless Tobacco    Never Used     Comment: quit age late 55s       Activities of Daily Living/Functional Screening Results:  Is the person deaf or does he/she have serious difficulty hearing?: No  Is this person blind or does he/she have serious difficulty seeing even when wearing glasses?: No  *Vision Status: Visual aid   Does this person have serious difficulty walking or climbing stairs?: No  Does this person have difficulty dressing or bathing?: No  *Shopping: Needs Assistance  *House Keeping: Independent  *Managing Own Medications: Independent  *Handling Finances: Independent  Difficulty doing errands due to a physicial, mental or emotional condition: Yes  Difficulty remembering or making decisions due to a physicial, mental or emotional condition: No    Assessment and Plan:      Cognitive Function:  Recall of recent and remote events appears:  Normal    The following health maintenance plan was reviewed with the patient:    Health Maintenance   Topic Date Due    Cervical Cancer Screening  10/14/2016    Breast Cancer Screening Other  11/08/2017    HEMOGLOBIN A1C  11/10/2017    URINE MICROALBUMIN  04/23/2018    LIPID DISORDER SCREENING  05/13/2018  FOOT EXAM  07/10/2018    DEPRESSION SCREEN YEARLY  10/01/2018    OPHTHALMOLOGY EXAM  09/26/2019    HIV TESTING OFFERED  Addressed    IMM-INFLUENZA  Completed     This health maintenance schedule, identified risks, a list of orders placed today and patient goals have been provided to Kaylee Harris in the after visit summary.     Plan for any concerns identified during screening or risk assessments:  HCP completed today  Scheduled appt for AGY  Labs requested

## 2017-10-20 NOTE — Progress Notes (Signed)
[facility]    SUBJECTIVE    Pt is here to discuss:    Chief Complaint   Patient presents with    Subsequent Annual Medicare Visit    Diabetes    Fibromyalgia    Loss of Consciousness    Contraception     mirena due to be changed     1. F/u ED - pt reported to the ED 6/13 for severe R lower/mid abdominal pain similar to 35yr ago. She describes feeling a "charlie horse" cramp that would come and go. She also had chills and back pain, although she has chronic back pain. She has had similar pain on/off in the past couple years, but it is usually mild and tolerable. This time it was severe, did not respond to tizanidine. CT showed b/l ovarian cysts, with a larger 2.5 x 2.3 cm ovarian cyst on the R. Pt had colonoscopy 236yrago, which was normal except for internal hemorrhoids.     2. Contraception - pt had Mirena IUD placed 10/2011. She previously had heavy bleeding, which has been controlled with Mirena. She would like to schedule Mirena removal/insertion.     3. DM - Pt has been using an insulin pump x 90m6mor endo. She loves it. She has lost 14lbs. Does check BGs, highest fasting BG has been 140.   Lab Results   Component Value Date    HA1C 9.4 (H) 05/13/2017    HA1C 11.0 (H) 02/17/2017    HA1C 7.4 (H) 05/21/2016    MALBR 0.43 04/23/2017    CREAT 0.90 10/16/2017    LDLC 79 05/13/2017     4. Fibromyalgia - uncontrolled pain. Wondering what to do for relief; denies relief with previous attempts with Lyrica, Tramadol. Currently o cymbalta She does not formally exercise, but sometimes walks her dog 6 houses down. Her wife says she has been more active around the house. She feels her depression is currently controlled; recently d/c-ed from her psychiatry office and our office will take over Rx's.      PMH / Family Hx / Social Hx  Patient's medications, allergies, problem list, past medical, social histories were reviewed and notable for:    Current Outpatient Prescriptions   Medication Sig Note     pantoprazole (PROTONIX) 40 MG EC tablet Take 1 tablet (40 mg total) by mouth daily   SWALLOW WHOLE. DO NOT CRUSH, BREAK, OR CHEW.     DULoxetine (CYMBALTA) 30 MG DR capsule Take 1 capsule (30 mg total) by mouth 2 times daily     mirtazapine (REMERON) 15 MG tablet Take 1 tablet (15 mg total) by mouth nightly     lamoTRIgine (LAMICTAL) 150 MG tablet Take 1 tablet (150 mg total) by mouth daily     azelastine (OPTIVAR) 0.05 % ophthalmic solution Place 1 drop into both eyes 2 times daily     SUMAtriptan refill (IMITREX STATDOSE) 6 MG/0.5ML injection INJECT 0.5ML UNDER THE SKIN ONCE AS NEEDED FOR MIGRAINE. MAY REPEAT DOSE ONCE AFTER 1 HOUR     dulaglutide (TRULICITY) 1.5 MG/IR/4.8NIjection pen Inject 0.5 mLs (1.5 mg total) into the skin every 7 days     glucagon (GLUCAGEN) 1 MG injection kit Inject 1 mg into the skin once as needed for Low blood sugar   Discard any unused portion.     busPIRone (BUSPAR) 15 MG tablet Take 2 tablets (30 mg total) by mouth 2 times daily     clonazePAM (KLONOPIN) 0.5 MG tablet Take  1 tablet (0.5 mg total) by mouth daily as needed   Max daily dose: 0.5 mg     ibuprofen (ADVIL,MOTRIN) 600 MG tablet Take 1 tablet (600 mg total) by mouth 3 times daily as needed for Pain     blood glucose test strip Test  8 times a day.   Brand name of strips Freestyle BG test strips for linked omnipod pump     Insulin Disposable Pump (OMNIPOD DASH SYSTEM) KIT By 1 kit no specified route as needed     Insulin Disposable Pump (OMNIPOD DASH 5 PACK) MISC By 1 Device no specified route as needed     promethazine (PHENERGAN) 25 MG tablet Take 1 tablet (25 mg total) by mouth every 4-6 hours as needed for Nausea     cetirizine (ZYRTEC) 10 MG tablet Take 1 tablet (10 mg total) by mouth daily     topiramate (TOPAMAX) 100 MG tablet Take 1 tablet (100 mg total) by mouth 2 times daily     Alcohol Swabs (ALCOHOL PREP) PADS Use BID for BG checkUse BID for BG check     traMADol (ULTRAM) 50 MG tablet Take 1  tablet (50 mg total) by mouth every 8 hours as needed (for head pain and abd pain)   Max daily dose: 150 mg     insulin lispro 100 UNIT/ML injection vial Inject as directed up to MDD 100 units     SUMAtriptan (IMITREX) 50 MG tablet TAKE 1 TABLET BY MOUTH AS NEEDED FOR MIGRAINE, TAKE AT ONSET OF HEADACHE. MAY REPEAT ONCE IN 2 HOURS     fluticasone (FLONASE) 50 MCG/ACT nasal spray 1 spray by Nasal route daily     SUMAVEL DOSEPRO 6 MG/0.5ML needle-free injection INJECT 0.5MLS (6MG TOTAL) UNDER THE SKIN AS NEEDED FOR MIGRAINE MAY REPEAT ONCE AFTER 1 HOUR IF NEEDED 12/16/2016: Received from: External Pharmacy    atorvastatin (LIPITOR) 40 MG tablet Take 1 tablet (40 mg total) by mouth daily (with dinner)     albuterol HFA 108 (90 Base) MCG/ACT inhaler Inhale 1-2 puffs into the lungs every 6 hours as needed for Wheezing   Shake well before each use.     famciclovir (FAMVIR) 500 MG tablet Take 1 tablet (500 mg total) by mouth 3 times daily as needed     metoprolol (TOPROL-XL) 25 MG 24 hr tablet Take by mouth daily    06/10/2016: Received from: External Pharmacy    ascorbic acid (VITAMIN C) 100 MG tablet Take 100 mg by mouth daily     midodrine (PROAMATINE) 10 MG tablet Take 1 tablet (10 mg total) by mouth 3 times daily     insulin syringe-needle U-100 (BD ULTRAFINE) 31G X 5/16" 0.3 ML HALF-UNIT Use 3 times a day as instructed.     lancets Brand Free Style Lite; Use 2 times per day as directed for blood glucose testing.     levonorgestrel (MIRENA) 20 MCG/24HR IUD 1 each by Intrauterine route once     Non-System Medication The above patient is followed in our clinic and cannot resume work permanently.        ROS  +14lbs lost in the past 49mo No current abdominal/pelvic pain      OBJECTIVE  Vitals:    10/20/17 1316   BP: 116/84   Pulse: 76   Temp: 36.7 C (98.1 F)   TempSrc: Temporal   Weight: 122.7 kg (270 lb 9.6 oz)   Height: 1.727 m (_0 )  Body mass index is 41.14 kg/m.      General: well-appearing  morbidly obese Caucasian female, pleasant & conversant, in NAD. Here with wife.   Psych: AAOx3, normal affect and mood. Insight and judgement intact.         ASSESSMENT & PLAN  1. Right lower quadrant abdominal pain  Episode of RLQ abd pain resolved. W/u only notable for R ovarian cyst. Recommended observation for now. Reviewed previous GI w/u including colonoscopy and EGD which were unrevealing. If recurrent episodes, could consider referral back for small bowel follow through or capsule endoscopy.     2. Type 2 diabetes mellitus  Suspect improving control with pump. Continue care per endo. Labs requested prior to endo appt tomorrow   - Hemoglobin A1c; Future    3. Leukocytosis  Will repeat CBC withA1c; suspect elevated WBC was acute phase reactant.  - CBC; Future    4. Fibromyalgia  Recommended focusing on behavioral interventions given previous medication trials and lack of relief. Referred to aqutherapy. Encouraged monitoring for triggers of fibro flares and pacing herself on good/bad days  - AMB REFERRAL TO PHYSICAL / OCCUPATIONAL THERAPY    5. Lumbar facet arthropathy  - AMB REFERRAL TO PHYSICAL / OCCUPATIONAL THERAPY    6. IUD (intrauterine device) in place  F/u next avail for AGY and IUD replacement    7. Preventative health care  Medicare wellness exam completed today       Follow-up: next availability for pap and IUD removal/insertion      [name], MD  [facility]  10/20/2017  1:48 PM      I [name], am scribing for and in the presence of Dr. [name]. 10/20/2017 1:48 PM.    I, Dr. [name], MD, personally performed the services described in this documentation, as scribed by [name] in my presence, and it is accurate and complete.  I have reviewed, added to, and completed this documentation personally and assure that it is complete and accurate. 10/20/2017 3:33 PM        ______________________

## 2017-10-21 ENCOUNTER — Ambulatory Visit: Payer: Medicare (Managed Care) | Attending: Psychiatry | Admitting: Psychiatry

## 2017-10-21 ENCOUNTER — Encounter: Payer: Self-pay | Admitting: Psychiatry

## 2017-10-21 VITALS — BP 112/82 | HR 64 | Resp 16 | Ht 68.0 in | Wt 271.0 lb

## 2017-10-21 DIAGNOSIS — E1165 Type 2 diabetes mellitus with hyperglycemia: Secondary | ICD-10-CM

## 2017-10-21 LAB — HEMOGLOBIN A1C: Hemoglobin A1C: 7.2 % — ABNORMAL HIGH

## 2017-10-21 NOTE — Patient Instructions (Signed)
Continue current pump settings  Hba1c before next appt in 3 months  Appt with [facility] in august

## 2017-10-21 NOTE — Progress Notes (Signed)
Endocrinology, Diabetes, and Metabolism [facility] for Diabetes.    Consult requested by: [name] MD    HPI:    This is a 42 y.o. female with a history of Type 2 Diabetes for almost 10 years.  Known complications include none.    Since her last visit, she has been doing very well. She started on her OmniPod pump and is enjoying it. She is managing her sugars well. She has lost 15#. Her partner is present and supportive.    Denies any CP or SOB, no polyuria, no polydipsia. No double or blurry vision. No severe hypoglycemia requiring assistance or EMS. No paresthesias in hands or feet. No open wounds or sores on feet and performs daily self checks.Has ongoing diabetes health maintenance activities done including routine eye exams, podiatry and dental.    Current diabetes regimen:  OMNIPOD  Basal Rates:  MN 0.95 units/hr  3AM 1.45 units/hr  6AM 1.2 units/hr  10PM 0.95 units/hr  ICR 8  ISF 40  Target 140  Active Insulin 4h    Blood sugar logs and/or meter were brought to the visit for review.  Reviewed on [facility]  Lowest BG 76- Highest 209  TIR 71%  Fasting 99-133  Lunch 76-143  Dinner 108-170  Bedtime 98    Checking 4-6x daily. Complains of sore fingers is interested in CGM.    She will be receiving the DASH system soon and will need more supplies.    hypoglycemia occurs 0 times a week.  It is most often at never.  There has been the need for medical assistance for these episodes none.      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 Lower abdomen with cellulitis changes, wound probed and purulent material expressed.  Had PICC line for "multiple infiltrations" of what? D/c-ed home on 10d of Augmentin 9/11.      Anginal pain     Anxiety      Arthritis     Asthma     Depression     Diabetes mellitus     Previously on SU and metformin, now diet controlled    Diverticulitis 09/2010    Dysfunctional uterine bleeding     Fibromyalgia     GERD (gastroesophageal reflux disease)     GERD (gastroesophageal reflux disease) 10/04/2016    High blood pressure     Hyperlipidemia     Liver disease     fatty liver     Long term (current) use of insulin, Dermal Adhesed V-Go Insulin Delivery Device 10/04/2016    Migraine     Neuromuscular disorder     POTS (postural orthostatic tachycardia syndrome)     Sebaceous cyst of breast     right axilla    TMJ click, left 09/11/5275    Varicella      Past Surgical History:   Procedure Laterality Date    APPENDECTOMY  2016    arthroscopic shoulder surgery Right 2010    Related to lifting    CARDIAC CATHETERIZATION  09/2011    negative    COLON SURGERY      DILATION AND CURETTAGE OF UTERUS  2005, 2007    x2 for menorraghia    LEFT COLECTOMY  01/03/14    Dr Tresa Res    loop recorder  Feb 03 2014    loop recorder removal      PR COLONOSCOPY THRU COLOTOMY N/A 02/16/2016    Procedure: COLONOSCOPY;  Surgeon: Kelly Splinter, MD;  Location: [facility];  Service: GI    PR CYSTOURETHROSCOPY,BIOPSY N/A 10/17/2016    Procedure: CYSTOSCOPY BLADDER ;  Surgeon: Ed Blalock, MD;  Location: [facility];  Service: Urology    PR EDG TRANSORAL BIOPSY SINGLE/MULTIPLE N/A 10/29/2016    Procedure: EGD;  Surgeon: Kelly Splinter, MD;  Location: [facility];  Service: GI    TONSILLECTOMY       Family History   Problem Relation Age of Onset    Hypertension Father     Diabetes Father     Kidney Disease Father     Elevated lipids Father     Heart attack Father 12    Other Father         PVD    Heart Disease Father     Hypertension Mother     Elevated lipids Mother     Heart attack Mother 36    Diabetes Mother     Eczema Mother     Psoriasis Mother     Heart Disease Mother      Hypertension Brother     Heart Disease Brother 68        prinzmetal's angina    Heart Disease Sister         currently having work up    [place] Sister     Hypertension Sister     Breast cancer Maternal Grandmother     Stroke Other     Cancer Sister     Thyroid disease Sister     Anesth problems Neg Hx      Social History     Social History    Marital status: Married     Spouse name: N/A    Number of children: N/A    Years of education: N/A     Social History Main Topics    Smoking status: Former Smoker     Packs/day: 0.50     Years: 3.00     Types: Cigarettes    Smokeless tobacco: Never Used      Comment: quit age late 35s    Alcohol use 0.0 oz/week      Comment: 0-1 drink/ month    Drug use: No    Sexual activity: Yes     Partners: Female     Patent examiner protection: IUD     Other Topics Concern    None     Social History Narrative    ** Merged History Encounter **         Married to [place], recently relocated back to [place] from [place] due to family stressors. 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        Allergies:   Allergies   Allergen Reactions    Morphine Itching    Trazodone Anaphylaxis    Seasonal Allergies Itching    Other [Other] Other (See Comments)     Derma Bond(skin glue) pruritis/redness and c/o respiratory distress.   Received name: Other       Current Outpatient Prescriptions on File Prior to Visit   Medication Sig Dispense Refill    pantoprazole (  PROTONIX) 40 MG EC tablet Take 1 tablet (40 mg total) by mouth daily   SWALLOW WHOLE. DO NOT CRUSH, BREAK, OR CHEW. 30 tablet 5    DULoxetine (CYMBALTA) 30 MG DR capsule Take 1 capsule (30 mg total) by mouth 2 times daily 180 capsule 3    mirtazapine (REMERON) 15 MG tablet Take 1 tablet (15 mg total) by mouth nightly 90 tablet 3    lamoTRIgine (LAMICTAL) 150 MG tablet Take 1 tablet (150 mg total) by mouth daily 90 tablet 0    azelastine (OPTIVAR) 0.05 % ophthalmic solution Place  1 drop into both eyes 2 times daily      SUMAtriptan refill (IMITREX STATDOSE) 6 MG/0.5ML injection INJECT 0.5ML UNDER THE SKIN ONCE AS NEEDED FOR MIGRAINE. MAY REPEAT DOSE ONCE AFTER 1 HOUR 2 Syringe 5    dulaglutide (TRULICITY) 1.5 GG/2.6RS injection pen Inject 0.5 mLs (1.5 mg total) into the skin every 7 days 6 mL 3    glucagon (GLUCAGEN) 1 MG injection kit Inject 1 mg into the skin once as needed for Low blood sugar   Discard any unused portion. 1 each 5    busPIRone (BUSPAR) 15 MG tablet Take 2 tablets (30 mg total) by mouth 2 times daily 120 tablet 2    clonazePAM (KLONOPIN) 0.5 MG tablet Take 1 tablet (0.5 mg total) by mouth daily as needed   Max daily dose: 0.5 mg 28 tablet 0    ibuprofen (ADVIL,MOTRIN) 600 MG tablet Take 1 tablet (600 mg total) by mouth 3 times daily as needed for Pain 90 tablet 3    blood glucose test strip Test  8 times a day.   Brand name of strips Freestyle BG test strips for linked omnipod pump 250 strip 11    Insulin Disposable Pump (OMNIPOD DASH SYSTEM) KIT By 1 kit no specified route as needed 1 kit 0    Insulin Disposable Pump (OMNIPOD DASH 5 PACK) MISC By 1 Device no specified route as needed 9 each 3    promethazine (PHENERGAN) 25 MG tablet Take 1 tablet (25 mg total) by mouth every 4-6 hours as needed for Nausea 30 tablet 3    cetirizine (ZYRTEC) 10 MG tablet Take 1 tablet (10 mg total) by mouth daily 90 tablet 1    topiramate (TOPAMAX) 100 MG tablet Take 1 tablet (100 mg total) by mouth 2 times daily 60 tablet 5    Alcohol Swabs (ALCOHOL PREP) PADS Use BID for BG checkUse BID for BG check 100 each 1    traMADol (ULTRAM) 50 MG tablet Take 1 tablet (50 mg total) by mouth every 8 hours as needed (for head pain and abd pain)   Max daily dose: 150 mg 42 tablet 0    insulin lispro 100 UNIT/ML injection vial Inject as directed up to MDD 100 units 40 mL 11    SUMAtriptan (IMITREX) 50 MG tablet TAKE 1 TABLET BY MOUTH AS NEEDED FOR MIGRAINE, TAKE AT ONSET OF HEADACHE. MAY  REPEAT ONCE IN 2 HOURS 9 tablet 5    fluticasone (FLONASE) 50 MCG/ACT nasal spray 1 spray by Nasal route daily 48 g 3    SUMAVEL DOSEPRO 6 MG/0.5ML needle-free injection INJECT 0.5MLS (6MG TOTAL) UNDER THE SKIN AS NEEDED FOR MIGRAINE MAY REPEAT ONCE AFTER 1 HOUR IF NEEDED  1    atorvastatin (LIPITOR) 40 MG tablet Take 1 tablet (40 mg total) by mouth daily (with dinner) 90 tablet 3    albuterol HFA 108 (90  Base) MCG/ACT inhaler Inhale 1-2 puffs into the lungs every 6 hours as needed for Wheezing   Shake well before each use. 1 Inhaler 5    famciclovir (FAMVIR) 500 MG tablet Take 1 tablet (500 mg total) by mouth 3 times daily as needed 30 tablet 5    metoprolol (TOPROL-XL) 25 MG 24 hr tablet Take by mouth daily         ascorbic acid (VITAMIN C) 100 MG tablet Take 100 mg by mouth daily      midodrine (PROAMATINE) 10 MG tablet Take 1 tablet (10 mg total) by mouth 3 times daily 90 tablet 0    insulin syringe-needle U-100 (BD ULTRAFINE) 31G X 5/16" 0.3 ML HALF-UNIT Use 3 times a day as instructed. 100 each 0    lancets Brand Free Style Lite; Use 2 times per day as directed for blood glucose testing. 100 each 2    levonorgestrel (MIRENA) 20 MCG/24HR IUD 1 each by Intrauterine route once      Non-System Medication The above patient is followed in our clinic and cannot resume work permanently. 1 each 1     No current facility-administered medications on file prior to visit.      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 no  -Dawn  phenomenon:  yes  -HbA1c greater than 7% or 1% over upper range of normal: yes  -History of suboptimal glycemic control before or during pregnancy: na  -LAST 30 DAYS of BG LOGS (in notes below) or ranges from: [facility] report 76-209    Review of Systems:  CONSTITUTIONAL:  Appetite good, no fevers, night sweats, +15# weight loss  HEAD: No headache, dizziness or syncope  EYES:No vision changes or eye pain  ENT: No hearing difficulties or ear pain  CV: No chest pain, shortness of breath or edema.  RESPIRATORY:  No SOB, cough or wheezing  GI:  No nausea, vomiting, abdominal pain or change in bowel habits  GU:  No dysuria, urgency or incontinence  NEURO:  No mental status changes, motor weakness or sensory changes  PSYCH:  + depression and anxiety, stable  MS:  No joint pain, swelling or musculoskeletal deformities  SKIN:  No rashes  HEME/LYMPH:  No easy bleeding, bruising or swollen nodes  ENDOCRINE:  No polyuria, polydipsia, polyphagia or heat/cold intolerance    Physical Examination:   Vitals:    10/21/17 1415   BP: 112/82   Pulse: 64   Resp: 16   Weight: 122.9 kg (271 lb)   Height: 1.727 m ('5\' 8"' )     Body mass index is 41.21 kg/m.  Wt Readings from Last 3 Encounters:   10/21/17 122.9 kg (271 lb)   10/20/17 122.7 kg (270 lb 9.6 oz)   10/16/17 125.6 kg (277 lb)       CONSTITUTIONAL:  well developed, well nourished, no acute distress  HEENT: EOMI, no lid lag, pink  conjunctiva, no proptosis.   NECK: supple, no thyromegaly, no lymphadenopathy, no acanthosis nigricans  HEART/VASCULAR: RRR, normal S1, S2, no MRG, no JVD or carotid bruit, no LE edema, clubbing, or cyanosis.  Pedal pulses 2+ bilaterally.  CHEST: lungs clear to auscultation bilaterally posteriorly  ABDOMEN: soft, nontender, nondistended  EXTREMITIES:  joints without deformity or synovitis. Foot exam deferred.  NEUROLOGICAL: alert and oriented x 3. Full power bilateral upper and lower extremities. No tremor noted.  SKIN:  No rashes. No lipohypertrophy.  LYMPH:  no palpable lymphadenopathy.  ENDOCRINE: No supraclavicular fat pads, facial plethora, moon facies, abdominal striae, thyromegaly.  PSYCH:mood bright and affect appropriate      Labs and Imaging:    I personally reviewed and confirmed all laboratory and radiology testing listed below:      Lab results: 10/16/17  2345   Sodium 139   Potassium 4.1   Chloride 103   CO2 21   UN 13   Creatinine 0.90   GFR,Caucasian 78   GFR,Black 90   Glucose 120*   Calcium 9.5         Lab Results   Component Value Date    HA1C 7.2 (H) 10/20/2017       Lab Results   Component Value Date    ALT 17 10/16/2017    AST 14 10/16/2017     Lab Results   Component Value Date    CHOL 160 05/13/2017    HDL 34 05/13/2017    LDLC 79 05/13/2017    TRIG 234 (!) 05/13/2017    [place] 4.7 05/13/2017     Lab Results   Component Value Date    TSH 1.31 07/10/2015       Lab Results   Component Value Date    WBC 10.7 (H) 10/20/2017    HGB 13.4 10/20/2017    HCT 42 10/20/2017    MCV 89 10/20/2017    PLT 397 (H) 10/20/2017       Assessment: This is a 42 y.o. female with Type 2 Diabetes Uncontrolled with hyperglycemia.  Goal A1c is <7%.Her blood sugar control has improved substantially via pump. Recommend continue with education for use of advanced pump therapy. Dexcom may be a helpful tool.    Plan:   Continue current pump settings  Meet with Gay Filler at [facility] for advanced features training on pump as well as emergency planning and sick day management  BP is at goal on current therapy.  On statin therapy.  Recommend annual dilated eye exams and biannual dental exams for routine health maintenance.  hba1c before next appointment  Will look into sensor at appointment with Gay Filler    Follow up: 3 month(s) or sooner if needed.      Nada Libman, NP    Nada Libman, NP  [facility] Department of Endocrinology  [address], [place], [place] [address]  Phone [phone]  Fax [phone]

## 2017-10-29 ENCOUNTER — Other Ambulatory Visit: Payer: Self-pay

## 2017-10-29 ENCOUNTER — Encounter: Payer: Self-pay | Admitting: Gastroenterology

## 2017-10-29 ENCOUNTER — Other Ambulatory Visit: Payer: Self-pay | Admitting: Psychiatry

## 2017-10-29 MED ORDER — BUSPIRONE HCL 15 MG PO TABS *I*
30.0000 mg | ORAL_TABLET | Freq: Two times a day (BID) | ORAL | 2 refills | Status: DC
Start: 2017-10-29 — End: 2017-12-02

## 2017-10-29 MED ORDER — TOPIRAMATE 100 MG PO TABS *I*
100.0000 mg | ORAL_TABLET | Freq: Two times a day (BID) | ORAL | 5 refills | Status: DC
Start: 2017-10-29 — End: 2018-05-12

## 2017-10-29 NOTE — Telephone Encounter (Signed)
Last appt: 10/20/2017  Next appt:  11/03/2017    Recent Lab Values 10/17/2017 10/17/2017 03/31/2017 10/29/2016 10/17/2016 09/25/2016 01/18/2016   EXP DATE 11-03-18 08-04-18 04/04/18 04/04/2017 08/03/17 03/2017 -   [facility] - - - - - - NEG

## 2017-11-03 ENCOUNTER — Encounter: Payer: Self-pay | Admitting: Primary Care

## 2017-11-03 ENCOUNTER — Ambulatory Visit: Payer: Medicare (Managed Care) | Attending: Primary Care | Admitting: Primary Care

## 2017-11-03 VITALS — BP 108/80 | HR 80 | Temp 97.4°F | Ht 68.0 in | Wt 276.2 lb

## 2017-11-03 DIAGNOSIS — Z975 Presence of (intrauterine) contraceptive device: Secondary | ICD-10-CM | POA: Insufficient documentation

## 2017-11-03 DIAGNOSIS — Z30433 Encounter for removal and reinsertion of intrauterine contraceptive device: Secondary | ICD-10-CM | POA: Insufficient documentation

## 2017-11-03 DIAGNOSIS — N764 Abscess of vulva: Secondary | ICD-10-CM

## 2017-11-03 DIAGNOSIS — Z01419 Encounter for gynecological examination (general) (routine) without abnormal findings: Secondary | ICD-10-CM

## 2017-11-03 LAB — HM PAP SMEAR

## 2017-11-03 MED ORDER — AMOXICILLIN-POT CLAVULANATE 500-125 MG PO TABS *I*
1.0000 | ORAL_TABLET | Freq: Three times a day (TID) | ORAL | 0 refills | Status: AC
Start: 2017-11-03 — End: 2017-11-08

## 2017-11-03 MED ORDER — LEVONORGESTREL (MIRENA) 20 MCG/DAY IU IUD *I*
1.0000 | INTRAUTERINE_SYSTEM | Freq: Once | INTRAUTERINE | Status: AC
Start: 2017-11-03 — End: 2017-11-03
  Administered 2017-11-03: 1 via INTRAUTERINE

## 2017-11-03 NOTE — Procedures (Signed)
IUD Removal and Insertion Procedure Note    Indications: menorrhagia/AUB, expiring IUD    Correct pt (2 identifiers)? Y  Correct procedure/site? Y  Correct materials? Y  Participants? Patient, Kaylee Drilling, MD   Time started: 9:22 AM  Time ended: 9:40 AM    Procedure Details   The risks (including infection, bleeding, pain, and uterine perforation) and benefits of the procedure were explained to the patient and written informed consent was obtained.      The patient was placed in the dorsal lithotomy position. A speculum was inserted into the vagina and cervix was visualized. Cervix cleansed with Betadine.  The IUD strings were seen and grasped with a ring forceps and the IUD was removed without difficulty.     Uterus sounded to 7 cm. New Mirena IUD inserted without difficulty. String visible and trimmed. Patient tolerated procedure well.    IUD Information:  Mirena, Lot # [id number], Expiration date 10/2019.    Condition:  Stable    Complications:  None    Plan:  The patient was advised to call for any fever or for prolonged or severe pain or bleeding. She was advised to use NSAID as needed for mild to moderate pain. All questions were answered and the patient stated a good understanding of instructions.      Kaylee Drilling, MD  [facility]  11/03/2017   9:58 AM     I [name], am scribing for and in the presence of Dr. Johny Harris. 11/03/2017 9:58 AM.    I, Dr. [name], MD, personally performed the services described in this documentation, as scribed by [name] in my presence, and it is accurate and complete.  I have reviewed, added to, and completed this documentation personally and assure that it is complete and accurate. 11/03/2017 12:42 PM

## 2017-11-03 NOTE — Procedures (Signed)
Procedures  Encounter Date: 11/03/2017    [name], MD   Family Medicine     Procedure: Incision and aspiration   Location: L labia minora  Indication: pain    Exam: pea sized tender nodule within the L labia minora. There is a second, smaller nodule that is less tender distal to this, within the same labia minora. No surrounding redness or overlying pustular changes    Correct pt (2 identifiers)? Y  Correct procedure/site? Y   Correct materials? Y  Participants? Patient, [name], MD   Time started: 9:50 AM  Time ended: 10:13 AM    Consent: Prior to proceeding, the patient/guardian was counseled on risks (bleeding, infection, adverse reaction to medication), benefits (treatment of abscess and infection), and alternatives (watching) and agreed to procedure.  Informed consent was verbalized.    Procedure: Time out performed.  Using clean technique, the surgical site was cleaned with Hibiclens and marked with surgical marking pen. It was then anesthestized with 10 cc 1% lidocaine without epinephrine.  Following anesthesia, the abscess was incised with a #15 gauge needle and syringe, and minimal amount of purulent material was encountered. Pt had continued discomfort at the medial aspect of the incision, and an additional 5cc of lidocaine without epinephrine was administered. The wound bed was explored with gentle dissection using curved mosquito clamp and iris scissors. Neither additional purulent material nor the cyst wall was encountered. The incision was closed with 2 interrupted sutures using 4-0 absorbable monofilament suture.  Patient tolerated procedure well with minimal pain, discomfort, or blood loss.     Post Procedure Instructions: Patient was counseled on wound care.  If lesions become irritated, ulcerated, red, developed discharge, or if she had any other concerns; she was informed to call. Given her h/o of labial abscess requiring hospitalization and DM comorbidity she was put on PO  Augmentin for infection prevention.       [name], MD  [place], am scribing for and in the presence of Dr. [name]. 11/03/2017 12:13 PM.    I, Dr. [name], MD, personally performed the services described in this documentation, as scribed by [name] in my presence, and it is accurate and complete.  I have reviewed, added to, and completed this documentation personally and assure that it is complete and accurate. 11/03/2017 12:41 PM

## 2017-11-03 NOTE — H&P (Signed)
[facility]: Annual Gyn Exam    HPI  Kaylee Harris is a 42 y.o. premenopausal female who presents for an annual gyn exam. The patient has the following concerns today:    1. L labial cyst - x couple days. Increasing discomfort. No current drainage. Requesting I&D today. H/o labial abscess 03/2017 requiring hospitalization and OR for incision and exploration.     2. Contraception - due for Mirena replacement.       OB/Gyn History  LMP: No LMP recorded. Patient has had an implant.   Menses: No menses since 2008 with Mirena IUD. Current IUD was placed 10/2011    Last pap smear: Date: 10/14/2013  Results: normal, HPV neg  History of abnormal pap smear: possibly, when she was quite young. No recent abnormal results.     The patient is sexually active.   Sexually active: with wife only  Together with partner: years  STD History: N/A  Contraception: female partner only, Mirena IUD      Obstetric History     No data available       Patient Active Problem List   Diagnosis Code    DM (diabetes mellitus), type 2, uncontrolled E11.65    Migraine with aura G43.109    Asthma J45.909    Hyperlipemia E78.5    Fibromyalgia M79.7    Syncope and collapse- recurrent, working diagnosis vascular vs psychogenic. 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1     Traumatic hemorrhage of right cerebrum S06.349A    Diverticulitis K57.92    Long term (current) use of insulin, Dermal Adhesed V-Go Insulin Delivery Device Z79.4    Morbid obesity with BMI of 40.0-44.9, adult E66.01, Z68.41    Hematuria R31.9    Right hip pain M25.551    Labial abscess N76.4    Labial cyst N90.7    Low back pain M54.5    Lumbar facet arthropathy M47.816      Past Medical History:   Diagnosis Date    Abscess of abdominal wall 01/18/2014    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 Lower abdomen with cellulitis changes, wound probed and purulent material expressed.  Had PICC line for "multiple infiltrations" of what? D/c-ed home on 10d of Augmentin 9/11.      Anginal pain     Anxiety     Arthritis     Asthma     Depression     Diabetes mellitus     Previously on SU and metformin, now diet controlled    Diverticulitis 09/2010    Dysfunctional uterine bleeding     Fibromyalgia     GERD (gastroesophageal reflux disease)     GERD (gastroesophageal reflux disease) 10/04/2016  High blood pressure     Hyperlipidemia     Liver disease     fatty liver     Long term (current) use of insulin, Dermal Adhesed V-Go Insulin Delivery Device 10/04/2016    Migraine     Neuromuscular disorder     POTS (postural orthostatic tachycardia syndrome)     Sebaceous cyst of breast     right axilla    TMJ click, left 11/11/4694    Varicella       Past Surgical History:   Procedure Laterality Date    APPENDECTOMY  2016    arthroscopic shoulder surgery Right 2010    Related to lifting    CARDIAC CATHETERIZATION  09/2011    negative    COLON SURGERY      DILATION AND CURETTAGE OF UTERUS  2005, 2007    x2 for menorraghia    LEFT COLECTOMY  01/03/14    Dr Tresa Res    loop recorder  Feb 03 2014    loop recorder removal      PR COLONOSCOPY THRU COLOTOMY N/A 02/16/2016    Procedure: COLONOSCOPY;  Surgeon: Kelly Splinter, MD;  Location: [facility];  Service: GI    PR  CYSTOURETHROSCOPY,BIOPSY N/A 10/17/2016    Procedure: CYSTOSCOPY BLADDER ;  Surgeon: Ed Blalock, MD;  Location: [facility];  Service: Urology    PR EDG TRANSORAL BIOPSY SINGLE/MULTIPLE N/A 10/29/2016    Procedure: EGD;  Surgeon: Kelly Splinter, MD;  Location: [facility];  Service: GI    TONSILLECTOMY       Current Outpatient Prescriptions   Medication    topiramate (TOPAMAX) 100 MG tablet    busPIRone (BUSPAR) 15 MG tablet    pantoprazole (PROTONIX) 40 MG EC tablet    DULoxetine (CYMBALTA) 30 MG DR capsule    mirtazapine (REMERON) 15 MG tablet    lamoTRIgine (LAMICTAL) 150 MG tablet    azelastine (OPTIVAR) 0.05 % ophthalmic solution    SUMAtriptan refill (IMITREX STATDOSE) 6 MG/0.5ML injection    dulaglutide (TRULICITY) 1.5 EX/5.2WU injection pen    glucagon (GLUCAGEN) 1 MG injection kit    clonazePAM (KLONOPIN) 0.5 MG tablet    ibuprofen (ADVIL,MOTRIN) 600 MG tablet    promethazine (PHENERGAN) 25 MG tablet    cetirizine (ZYRTEC) 10 MG tablet    traMADol (ULTRAM) 50 MG tablet    insulin lispro 100 UNIT/ML injection vial    SUMAtriptan (IMITREX) 50 MG tablet    fluticasone (FLONASE) 50 MCG/ACT nasal spray    atorvastatin (LIPITOR) 40 MG tablet    albuterol HFA 108 (90 Base) MCG/ACT inhaler    famciclovir (FAMVIR) 500 MG tablet    metoprolol (TOPROL-XL) 25 MG 24 hr tablet    ascorbic acid (VITAMIN C) 100 MG tablet    midodrine (PROAMATINE) 10 MG tablet    amoxicillin-clavulanate (AUGMENTIN) 500-125 MG per tablet    blood glucose test strip    Insulin Disposable Pump (OMNIPOD DASH SYSTEM) KIT    Insulin Disposable Pump (OMNIPOD DASH 5 PACK) MISC    Alcohol Swabs (ALCOHOL PREP) PADS    SUMAVEL DOSEPRO 6 MG/0.5ML needle-free injection    insulin syringe-needle U-100 (BD ULTRAFINE) 31G X 5/16" 0.3 ML HALF-UNIT    lancets    Non-System Medication     Current Facility-Administered Medications   Medication Dose Route Frequency    levonorgestrel (MIRENA) 20 MCG/24HR IUD 1  each  1 each Intrauterine Once     Allergies  Allergen Reactions    Morphine Itching    Trazodone Anaphylaxis    Seasonal Allergies Itching    Other [Other] Other (See Comments)     Derma Bond(skin glue) pruritis/redness and c/o respiratory distress.   Received name: Other     Family History   Problem Relation Age of Onset    Hypertension Father     Diabetes Father     Kidney Disease Father     Elevated lipids Father     Heart attack Father 36    Other Father         PVD    Heart Disease Father     Hypertension Mother     Elevated lipids Mother     Heart attack Mother 64    Diabetes Mother     Eczema Mother     Psoriasis Mother     Heart Disease Mother     Hypertension Brother     Heart Disease Brother 79        prinzmetal's angina    Heart Disease Sister         currently having work up    [place] Sister     Hypertension Sister     Breast cancer Maternal Grandmother     Stroke Other     Cancer Sister     Thyroid disease Sister     Anesth problems Neg Hx      Social History     Social History    Marital status: Married     Spouse name: N/A    Number of children: N/A    Years of education: N/A     Occupational History    Not on file.     Social History Main Topics    Smoking status: Former Smoker     Packs/day: 0.50     Years: 3.00     Types: Cigarettes    Smokeless tobacco: Never Used      Comment: quit age late 89s    Alcohol use 0.0 oz/week      Comment: 0-1 drink/ month    Drug use: No    Sexual activity: Yes     Partners: Female     Patent examiner protection: IUD     Social History Narrative    ** Merged History Encounter **         Married to [place], recently relocated back to [place] from [place] due to family stressors. 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            Health Maintenance  Health Maintenance Topics with due status: Due Soon       Topic Date Due    Breast Cancer Screening Other 11/08/2017     Health Maintenance  Topics with due status: Not Due       Topic Last Completion Date    Cervical Cancer Screening 10/14/2013    IMM-INFLUENZA 02/23/2017    URINE MICROALBUMIN 04/23/2017    LIPID DISORDER SCREENING 05/13/2017    FOOT EXAM 07/09/2017    OPHTHALMOLOGY EXAM 09/25/2017    HEMOGLOBIN A1C 10/20/2017    DEPRESSION SCREEN YEARLY 10/20/2017     Health Maintenance Topics with due status: Completed / Addressed / Aged Out       Topic Last Completion Date    HIV TESTING OFFERED 06/08/2014     Has mammogram scheduled for next week. Pap updated today.      ROS  Constitutional--negative for fever and chills  Respiratory--negative for shortness of breath, cough, wheeze  Cardiovascular--negative for chest pain, palpitations, syncope  Gastrointestinal--negative for abdominal pain, n/v/c/d, bloody stool  Genitourinary:  --negative for pain with urination, hematuria, incontinence  --negative for dyspareunia  +vaginal discharge that comes and goes, non odorous and without itching/irritation  Heme/Lymph:  --negative for swollen lymph nodes   --negative for easy bruising or bleeding  Skin/Breast--negative for rashes or concerning mole or breast concerns  Psych--negative for depressed mood, anxiety, substance abuse, SI      PHYSICAL EXAM  Vitals:    11/03/17 0916   BP: 108/80   Pulse: 80   Temp: 36.3 C (97.4 F)   TempSrc: Temporal   Weight: 125.3 kg (276 lb 3.2 oz)   Height: 1.727 m ('5\' 8"' )     Body mass index is 42 kg/m.      General: well-appearing morbidly obese Caucasian female, pleasant & conversant, in NAD. Here with wife.   Eyes: Wearing glasses. PERRLA. EOMI. Conjunctiva pink, without swelling or exudate. Lids normal.   ENT: Moist mucous membranes. Normal lips and dentition. B/L TMs grey, without bulging or erythema.   Neck: Trachea midline. Thyroid nontender, without nodularity or enlargement.   Lymph: No cervical or supraclavicular lymphadenopathy.  Lungs: Normal resp effort. CTAB.  No crackles or wheezes.  Cardiac: RRR, no M/R/G.  No pedal edema. Pulses 2+ b/l.     Abd: S/NT. Central obesity. Normoactive BS.  No masses or organomegaly.  No guarding or rebound tenderness.  Psych: AAOx3, normal affect and mood. Insight and judgement intact.   GU: normal external female genitalia.  Pt points out pea sized tender nodule deep within the L labia minora. There is a second, smaller nodule that is less tender distal to this, within the same labia minora. No surrounding redness or overlying pustular changes. No discharge at vaginal introitus.  Normal cervix, with IUD strings visble. No masses in vaginal vault.   Breasts: breasts appear normal, no suspicious masses, no skin or nipple changes or axillary nodes.  Skin: no lesions on back, trunk or arms.         ASSESSMENT  KATRICIA PREHN is a 42 y.o. female here for annual gyn exam.      Other issues that were addressed today include:    1. Well woman exam with routine gynecological exam  - GYN Cytology; Future  - GYN Cytology    2. Encounter for IUD removal and reinsertion  See procedure note from today.     3. Labial abscess  See PROC note re: I&E         PLAN    Immunizations:   Immunization History   Administered Date(s) Administered    H1N1 03/14/2008    Influenza Adult(44yrand up) 01/18/2013    Influenza PF(3 year and up) 02/20/2007    Influenza Quadrivalent 0.556mprefilled syringe/single dose vial(FluLaval,Fluzone,Afluria,Fluarix) 05/11/2015, 05/13/2016, 02/23/2017    Influenza Split (GSHolt11/01/2008, 03/03/2009, 03/13/2010    Influenza multi-dose vial 01/27/2014    MMR 11/18/1979, 06/11/1990    Pneumococcal Polysaccharide (Pneumovax) 03/13/2010    Tdap 05/31/2013     Pt will get me records of her immunizations    Weight management: Body mass index is 42 kg/m.. Marland Kitchen  Discussed the patient's BMI with her. The BMI is above average; BMI management plan is completed.      STD testing: offered and declined today    Anticipatory/preventative guidance  Tobacco use and cessation  counseling if  indicated: N/A  Alcohol use: Rare (< monthly)  Drug use: N/A  Routine sunscreen use advised/ skin cancer prevention discussed: Y, skin checked today  Dentist: UTD  Eye exam: UTD  Exercise: Y, starting aquatherapy at [place].  Marital/relationship history: lives with wife  Depression screening evaluation performed today: Y  Recent Review Flowsheet Data     PHQ Scores 10/20/2017 09/30/2017 08/14/2017 06/09/2017 05/22/2017 04/23/2017 02/24/2017    PSQ2 Q1 - Interest/Pleasure N - - - - N -    PSQ2 Q2 - Down, Depressed, Hopeless N - - - - N -    PHQ Q9 - Better Off Dead - 0 0 0 0 - 0    PHQ Calculated Score - '7 4 9 5 ' - 4      Cardiac risk factor modification discussed: Y. BP at goal, on Statin, DM control improving. Recently starting aerobic exercise The 10-year ASCVD risk score Mikey Bussing DC Jr., et al., 2013) is: 1.5%   Diabetes prevention and screening discussed: Y, managed by endo.       Follow-up: [address] sooner PRN    Signed:  JJohny Drilling MD  CS[facility] am scribing for and in the presence of Dr. JJohny Drilling 11/03/2017 12:09 PM.    I, Dr. JJohny Drilling MD, personally performed the services described in this documentation, as scribed by CLuellen Puckerin my presence, and it is accurate and complete.  I have reviewed, added to, and completed this documentation personally and assure that it is complete and accurate. 11/03/2017 12:38 PM

## 2017-11-04 ENCOUNTER — Other Ambulatory Visit: Payer: Self-pay | Admitting: Primary Care

## 2017-11-04 ENCOUNTER — Other Ambulatory Visit: Payer: Self-pay | Admitting: Pulmonary and Critical Care Medicine

## 2017-11-04 DIAGNOSIS — Z760 Encounter for issue of repeat prescription: Secondary | ICD-10-CM

## 2017-11-04 MED ORDER — TRAMADOL HCL 50 MG PO TABS *I*
50.0000 mg | ORAL_TABLET | Freq: Three times a day (TID) | ORAL | 0 refills | Status: DC | PRN
Start: 2017-11-04 — End: 2017-11-09

## 2017-11-04 MED ORDER — ALCOHOL PREP PADS *I*
MEDICATED_PAD | 1 refills | Status: DC
Start: 2017-11-04 — End: 2017-11-28

## 2017-11-04 NOTE — Telephone Encounter (Signed)
Last appt: 11/03/2017  Next appt:  11/04/2017    Recent Lab Values 10/17/2017 10/17/2017 03/31/2017 10/29/2016 10/17/2016 09/25/2016 01/18/2016   EXP DATE 11-03-18 08-04-18 04/04/18 04/04/2017 08/03/17 03/2017 -   [facility] - - - - - - NEG

## 2017-11-04 NOTE — Telephone Encounter (Signed)
Last appt: 11/03/2017  Next appt:  Visit date not found    Recent Lab Values 10/17/2017 10/17/2017 03/31/2017 10/29/2016 10/17/2016 09/25/2016 01/18/2016   EXP DATE 11-03-18 08-04-18 04/04/18 04/04/2017 08/03/17 03/2017 -   [facility] - - - - - - NEG       Patient Name: Kaylee Harris Birth Date: 05-31-1975   Address: [place], [place] [address] Sex: Female   Rx Written Rx Dispensed Drug Quantity Days Supply Prescriber Name Payment Method Dispenser   08/08/2017 08/08/2017 clonazepam 0.5 mg tablet  28 28 Corigliano, [name] D NP [facility] #02   06/25/2017 06/26/2017 lyrica 150 mg capsule  60 30 [name], Darnell Level (MD) [facility] #02   05/27/2017 05/29/2017 lyrica 150 mg capsule  60 30 [facility] [place]. #02   05/09/2017 05/12/2017 diphenoxylate-atropine 2.5-0.025 mg tablet  240 30 [place]ord Nab A ([facility]) [facility] [place]. #02   04/24/2017 04/24/2017 tramadol hcl 50 mg tablet  42 14 [facility], [place]. #02   04/24/2017 04/24/2017 lyrica 150 mg capsule  60 30 [facility] [place]. #02   04/18/2017 04/23/2017 diazepam 5 mg tablet  2 1 [facility], [place] [facility]02   04/04/2017 04/04/2017 oxycodone-acetaminophen 5-325 mg tablet  10 5 Rene Kocher, [place]. #02   04/01/2017 04/01/2017 oxycodone hcl 5 mg tablet  6 1 [facility] Medicare [facility]   04/01/2017 04/01/2017 oxycodone hcl 5 mg capsule  6 1 [place]. #02   03/24/2017 03/24/2017 lyrica 150 mg capsule  60 30 [facility] [place]. #02   02/20/2017 02/21/2017 lyrica 75 mg capsule  60 30 [name], Darnell Level (MD) [place]. #02   02/18/2017 02/19/2017 clonazepam 0.5 mg tablet  28 28 Corigliano, [name] D NP Insurance  [place]. #02   01/22/2017 01/22/2017 lyrica 75 mg capsule  60 30 [name], Darnell Level (MD) [place]. #02   01/21/2017 01/21/2017 tramadol hcl 50 mg tablet  42 14 [facility], [place]. #02   11/21/2016 11/21/2016 diazepam 5 mg tablet  2 1 [place] Silos (MD) [place]. #02   11/08/2016 11/08/2016 tramadol hcl 50 mg tablet  14 14 [name], Darnell Level (MD) [place]. [phone]

## 2017-11-07 ENCOUNTER — Telehealth: Payer: Self-pay

## 2017-11-07 DIAGNOSIS — Z760 Encounter for issue of repeat prescription: Secondary | ICD-10-CM

## 2017-11-07 NOTE — Telephone Encounter (Signed)
Prescription Information: Date Filled:    [id number]  Rx: Tramadol HCI 50 mg tabs  Qty/Day Supply: 42 tabs  Sig: Take 1 tablet (50 mg total) by mouth every 8 hours as needed (for head pain and abd pain)  Max daily dose: 150 mg - Oral

## 2017-11-09 ENCOUNTER — Encounter: Payer: Self-pay | Admitting: Psychiatry

## 2017-11-09 DIAGNOSIS — E1165 Type 2 diabetes mellitus with hyperglycemia: Secondary | ICD-10-CM

## 2017-11-09 MED ORDER — TRAMADOL HCL 50 MG PO TABS *I*
50.0000 mg | ORAL_TABLET | Freq: Three times a day (TID) | ORAL | 0 refills | Status: DC | PRN
Start: 2017-11-09 — End: 2018-01-18

## 2017-11-10 ENCOUNTER — Encounter: Payer: Self-pay | Admitting: Nutrition

## 2017-11-10 ENCOUNTER — Encounter: Payer: Self-pay | Admitting: Registered Nurse

## 2017-11-10 ENCOUNTER — Telehealth: Payer: Self-pay

## 2017-11-10 ENCOUNTER — Other Ambulatory Visit: Payer: Self-pay | Admitting: Gastroenterology

## 2017-11-10 ENCOUNTER — Encounter: Payer: Self-pay | Admitting: Psychiatry

## 2017-11-10 DIAGNOSIS — E1165 Type 2 diabetes mellitus with hyperglycemia: Secondary | ICD-10-CM

## 2017-11-10 LAB — GYN CYTOLOGY

## 2017-11-10 LAB — HM MAMMOGRAPHY

## 2017-11-10 MED ORDER — LANCETS MISC *A*
11 refills | Status: DC
Start: 2017-11-10 — End: 2017-11-10

## 2017-11-10 MED ORDER — GLUCOSE BLOOD VI STRP *A*
ORAL_STRIP | 11 refills | Status: DC
Start: 2017-11-10 — End: 2017-11-10

## 2017-11-10 MED ORDER — OMNIPOD DASH PODS (GEN 4) MISC
1.0000 | 3 refills | Status: DC | PRN
Start: 2017-11-10 — End: 2017-11-12

## 2017-11-10 MED ORDER — BAYER MICROLET LANCETS MISC *A*
11 refills | Status: AC
Start: 2017-11-10 — End: ?

## 2017-11-10 MED ORDER — GLUCOSE BLOOD VI STRP *A*
ORAL_STRIP | 11 refills | Status: DC
Start: 2017-11-10 — End: 2020-04-16

## 2017-11-10 NOTE — Telephone Encounter (Signed)
Prescription      Disp Refills Start End    traMADol (ULTRAM) 50 MG tablet 42 tablet 0 11/09/2017     Sig - Route: Take 1 tablet (50 mg total) by mouth every 8 hours as needed (for head pain and abd pain)  Max daily dose: 150 mg - Oral    Sent to pharmacy as: traMADol (ULTRAM) 50 MG tablet

## 2017-11-11 NOTE — Telephone Encounter (Signed)
Approved 10/12/17-12/11/17

## 2017-11-12 MED ORDER — OMNIPOD DASH PODS (GEN 4) MISC
1.0000 | 3 refills | Status: DC | PRN
Start: 2017-11-12 — End: 2018-05-15

## 2017-11-13 ENCOUNTER — Encounter: Payer: Self-pay | Admitting: Primary Care

## 2017-11-13 ENCOUNTER — Ambulatory Visit: Payer: Medicare (Managed Care) | Attending: Primary Care | Admitting: Primary Care

## 2017-11-13 VITALS — BP 118/84 | HR 80 | Temp 98.1°F | Ht 68.0 in | Wt 271.6 lb

## 2017-11-13 DIAGNOSIS — N764 Abscess of vulva: Secondary | ICD-10-CM

## 2017-11-13 DIAGNOSIS — N907 Vulvar cyst: Secondary | ICD-10-CM

## 2017-11-13 NOTE — Progress Notes (Signed)
[facility]    SUBJECTIVE    Pt is here to discuss:    Chief Complaint   Patient presents with    Cyst     right side vagina     1. R labial cyst - x 4d. Located in exact location as the one she had on the L side 7/1.  She has pain with friction, especially when walking. She had I&A of L labial cyst 7/1 and was started on Augmentin x 5d, which she completed. She has noticed she gets more cysts in the summer. She's looking for more definitive treatment. Her previous GYN suspected hydradenitis. Pt's brother gets cysts on her legs, arms, and face.     Pt has lost 5lbs since her last visit. She has been doing aquatherapy in a 98 degree pool. Had her intake appointment on 6/26. Her next appointment is on Monday. She does wear a bathing suit, and takes the suit off immediately afterwards. Her bathing suit is very loose on her.      PMH / Family Hx / Social Hx  Patient's medications, allergies, problem list, past medical, social histories were reviewed and notable for:    Current Outpatient Prescriptions   Medication Sig Note    Insulin Disposable Pump (OMNIPOD DASH 5 PACK) MISC By 1 Device no specified route as needed     blood glucose test strip Test  8 times a day.   Brand name of strips Contour NEXT test strips for linked omnipod pump     lancets (BAYER MICROLET) Use   8  times per day as instructed for blood glucose testing.     traMADol (ULTRAM) 50 MG tablet Take 1 tablet (50 mg total) by mouth every 8 hours as needed (for head pain and abd pain)   Max daily dose: 150 mg     Alcohol Swabs (ALCOHOL PREP) PADS Use BID for BG checkUse BID for BG check     topiramate (TOPAMAX) 100 MG tablet Take 1 tablet (100 mg total) by mouth 2 times daily     busPIRone (BUSPAR) 15 MG tablet Take 2 tablets (30 mg total) by mouth 2 times daily     pantoprazole (PROTONIX) 40 MG EC tablet Take 1 tablet (40 mg total) by mouth daily   SWALLOW WHOLE. DO NOT CRUSH, BREAK, OR CHEW.     DULoxetine (CYMBALTA) 30 MG DR  capsule Take 1 capsule (30 mg total) by mouth 2 times daily     mirtazapine (REMERON) 15 MG tablet Take 1 tablet (15 mg total) by mouth nightly     lamoTRIgine (LAMICTAL) 150 MG tablet Take 1 tablet (150 mg total) by mouth daily     azelastine (OPTIVAR) 0.05 % ophthalmic solution Place 1 drop into both eyes 2 times daily     SUMAtriptan refill (IMITREX STATDOSE) 6 MG/0.5ML injection INJECT 0.5ML UNDER THE SKIN ONCE AS NEEDED FOR MIGRAINE. MAY REPEAT DOSE ONCE AFTER 1 HOUR     dulaglutide (TRULICITY) 1.5 JX/9.1YN injection pen Inject 0.5 mLs (1.5 mg total) into the skin every 7 days     glucagon (GLUCAGEN) 1 MG injection kit Inject 1 mg into the skin once as needed for Low blood sugar   Discard any unused portion.     clonazePAM (KLONOPIN) 0.5 MG tablet Take 1 tablet (0.5 mg total) by mouth daily as needed   Max daily dose: 0.5 mg     ibuprofen (ADVIL,MOTRIN) 600 MG tablet Take 1 tablet (600 mg total)  by mouth 3 times daily as needed for Pain     promethazine (PHENERGAN) 25 MG tablet Take 1 tablet (25 mg total) by mouth every 4-6 hours as needed for Nausea     cetirizine (ZYRTEC) 10 MG tablet Take 1 tablet (10 mg total) by mouth daily     insulin lispro 100 UNIT/ML injection vial Inject as directed up to MDD 100 units     SUMAtriptan (IMITREX) 50 MG tablet TAKE 1 TABLET BY MOUTH AS NEEDED FOR MIGRAINE, TAKE AT ONSET OF HEADACHE. MAY REPEAT ONCE IN 2 HOURS     fluticasone (FLONASE) 50 MCG/ACT nasal spray 1 spray by Nasal route daily     SUMAVEL DOSEPRO 6 MG/0.5ML needle-free injection INJECT 0.5MLS (6MG TOTAL) UNDER THE SKIN AS NEEDED FOR MIGRAINE MAY REPEAT ONCE AFTER 1 HOUR IF NEEDED 12/16/2016: Received from: External Pharmacy    atorvastatin (LIPITOR) 40 MG tablet Take 1 tablet (40 mg total) by mouth daily (with dinner)     albuterol HFA 108 (90 Base) MCG/ACT inhaler Inhale 1-2 puffs into the lungs every 6 hours as needed for Wheezing   Shake well before each use.     famciclovir (FAMVIR) 500 MG  tablet Take 1 tablet (500 mg total) by mouth 3 times daily as needed     metoprolol (TOPROL-XL) 25 MG 24 hr tablet Take by mouth daily    06/10/2016: Received from: External Pharmacy    ascorbic acid (VITAMIN C) 100 MG tablet Take 100 mg by mouth daily     midodrine (PROAMATINE) 10 MG tablet Take 1 tablet (10 mg total) by mouth 3 times daily     insulin syringe-needle U-100 (BD ULTRAFINE) 31G X 5/16" 0.3 ML HALF-UNIT Use 3 times a day as instructed.     Non-System Medication The above patient is followed in our clinic and cannot resume work permanently.        ROS  No fever or chills      OBJECTIVE  Vitals:    11/13/17 1027   BP: 118/84   Pulse: 80   Temp: 36.7 C (98.1 F)   TempSrc: Temporal   Weight: 123.2 kg (271 lb 9.6 oz)   Height: 1.727 m (_0 )     Body mass index is 41.3 kg/m.      General: well-appearing morbidly obese Caucasian female, pleasant & conversant, in NAD  Psych: AAOx3, normal affect and mood. Insight and judgement intact.   GU: R labia minora is visibly swollen, ~2x the size of the L side. Without erythema or warmth. Tender to palpation. Faint overlying sinus tract at medial aspect. Some fluctuance on palpation. L side has persistent pea sized nodule at base of labia minora. Previous incision site at lateral labia healed / closed.          ASSESSMENT & PLAN    1. Labial cyst  Labial cysts aspirated today with purulent sebaceous material, see procedure note. I congratulated pt on weight loss. We reviewed that continued weight loss will likely improve the frequency of the cysts. I recommended sitz baths 3-4x daily through the weekend. No evidence of surrounding cellulitis and not extensive I advised pt to let me know she has worsening swelling and pain or drainage, and we will order abx (consider clindamycin as she was just on augmentin) . Referred to GYN specialist for further management.   - AMB REFERRAL TO OBSTETRICS & GYNECOLOGY      Follow-up: PRN      [name],  MD  [facility]  11/13/2017  10:57 AM      I [name], am scribing for and in the presence of Dr. [name]. 11/13/2017 10:57 AM.    I, Dr. [name], MD, personally performed the services described in this documentation, as scribed by [name] in my presence, and it is accurate and complete.  I have reviewed, added to, and completed this documentation personally and assure that it is complete and accurate. 11/13/2017 11:54 AM        ______________________

## 2017-11-13 NOTE — Procedures (Signed)
Procedures  Encounter Date: 11/13/2017    [name], MD   Family Medicine     Procedure: Incision and aspiration   Location: R labia minora  Indication: pain    Exam: R labia minora is visibly swollen, ~2x the size of the L side. Without erythema or warmth. Tender to palpation. Faint overlying sinus tract at medial aspect. Some fluctuance on palpation.    Correct pt (2 identifiers)? Y  Correct procedure/site? Y   Correct materials? Y  Participants? Patient, [name], MD   Time started: 10:43 AM  Time ended: 10:51 AM    Consent: Prior to proceeding, the patient/guardian was counseled on risks (bleeding, infection, adverse reaction to medication), benefits (treatment of abscess and infection), and alternatives (watching) and agreed to procedure.  Informed consent was verbalized.    Procedure: Time out performed.  Using clean technique, the surgical site was cleaned with Hibiclens. It was then anesthestized with 4 cc 1% lidocaine without epinephrine, 3cc at base of labia, 1cc at surface.  Following anesthesia, the abscess was entered with a #18 gauge needle and syringe, and <1cc amount of yellow purulent material was encountered. The needle was then advanced throughout the labia within the palpable cystic structures via the same entry site. Additional but minimal purulence was encountered. Upon withdrawal of needle/syringe, minimal bleeding was noted. Gentle pressure and milking of the labia was neg for any further purulent or sebaceous drainage.      Gauze was placed over the puncture site.  Patient tolerated procedure well with minimal pain, discomfort, or blood loss.     Post Procedure Instructions: Patient was counseled on wound care.  If lesions become irritated, ulcerated, red, developed discharge, or if she had any other concerns; she was informed to call. Given her h/o of labial abscess requiring hospitalization and DM comorbidity she was put on PO Augmentin for infection prevention.        [name], MD  [place], am scribing for and in the presence of Dr. [name]. 11/13/2017 10:44 AM.    I, Dr. [name], MD, personally performed the services described in this documentation, as scribed by [name] in my presence, and it is accurate and complete.  I have reviewed, added to, and completed this documentation personally and assure that it is complete and accurate. 11/13/2017 11:32 AM

## 2017-11-13 NOTE — Patient Instructions (Signed)
Dr Avon Gully at [facility]

## 2017-11-17 ENCOUNTER — Telehealth: Payer: Self-pay

## 2017-11-17 NOTE — Telephone Encounter (Signed)
Reatha Armour received a call from [facility] saying they need a script from you and also chart notes. The fax number for her is [phone].  I explained that you were unavailable until Wednesday which she already knew but wanted me to my chart you.  Thank you,  Azlee Monforte

## 2017-11-18 ENCOUNTER — Encounter: Payer: Self-pay | Admitting: Obstetrics and Gynecology

## 2017-11-18 ENCOUNTER — Ambulatory Visit: Payer: Medicare (Managed Care) | Attending: Obstetrics and Gynecology | Admitting: Obstetrics and Gynecology

## 2017-11-18 VITALS — BP 130/80 | Wt 273.6 lb

## 2017-11-18 DIAGNOSIS — N764 Abscess of vulva: Secondary | ICD-10-CM

## 2017-11-18 MED ORDER — CLINDAMYCIN HCL 300 MG PO CAPS *I*
300.0000 mg | ORAL_CAPSULE | Freq: Three times a day (TID) | ORAL | 0 refills | Status: AC
Start: 2017-11-18 — End: 2017-11-25

## 2017-11-18 NOTE — Progress Notes (Deleted)
[facility] Visit  Location: [facility]     Subjective     Kaylee Harris is a 42 y.o. No obstetric history on file. who presents ***.    Patient's medications, allergies, past medical, surgical, obstetrical, gynecologic, social and family histories were reviewed and updated as appropriate.    General ROS: {rosgen:310653::"negative"}   Respiratory ROS: {ros resp:310659::"no cough, shortness of breath, or wheezing"}  Cardiovascular ROS: {roscv:310661::"no chest pain or dyspnea on exertion"}  Gastrointestinal ROS: {ros gi:310669::"no abdominal pain, change in bowel habits, or black or bloody stools"}  Urinary ROS: {rosgu:310671::"no dysuria, trouble voiding, or hematuria"}  Musculoskeletal ROS: {ros musculoskeletal:310677::"negative"}    Objective     There were no vitals taken for this visit.     General: {GENERAL - POS/NEG:21297}  Abdomen: {Exam; abdomen:16834::"soft, non-tender; bowel sounds normal; no masses,  no organomegaly"}    Pelvic:   VULVA: {details:315901::"normal appearing vulva with no masses, tenderness or lesions"}  VAGINA: {details:315903::"normal appearing vagina with normal color and discharge, no lesions"}  CERVIX: {details:315904::"normal appearing cervix without discharge or lesions"}  UTERUS: {details:315905::"uterus is normal size, shape, consistency and nontender"}  ADNEXA: {details:315906::"normal adnexa in size, nontender and no masses"}  Examination chaperoned by ***.    Wet Prep: {pe wet prep/koh:313109::"no pathogens"}    Assessment     Kaylee Harris is a 42 y.o. No obstetric history on file. presenting to the office for ***.     1. Labial abscess           Plan     ***    Dispo:  She will follow-up {FOLLOW-UP GENERIC:20225}.    *** min face to face time spent with the patient; >50% of this time was spent on counseling and coordination of care.    Anderson Malta, MD 11/18/2017 2:34 PM

## 2017-11-18 NOTE — Procedures (Signed)
Subjective:       Kaylee Harris is a 42 y.o. 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 Lesion is located in the right labia minora. Onset was 2 days ago. Symptoms have gradually worsened.      Abscess has associated symptoms of pain, no spontaneous drainage. Patient does have previous history of labial and inguinal abscesses. Patient does have diabetes which is now under better control with an insulin pump.      Patient's medications, allergies, past medical, surgical, social and family histories were reviewed and updated as appropriate.      Objective:      There is an area characterized by induration, fluctuance, tenderness measuring 2 cm in greatest dimension. Location: right labia minora.  She also has a small, 05.cm tender nodule of her left medial gluteal fold.  Minimal surrounding erythema, no fluctuance, no spontaneous drainage.     Procedure  Informed consent obtained.  The area was prepped in the usual manner and the skin overlying the abscess was anesthetized with 2cc of 1% plain lidocaine.  The area was sharply incised and approx 10ccs of purulent material was obtained. Packing was not inserted.   Hemostasis obtained with application of silver nitrate     Assessment:        Labial abscess, no exam findings consistent with hydradenitis suppurativa       Plan:      Oral antibiotics -- see med orders.  I & D procedure as above.   Smaller gluteal abscess not amenable to drainage.      RTC 2-3 weeks to follow up, earlier if symptoms worsen.    Laruth Bouchard, MD  OB/GYN Attending  Pager [phone]

## 2017-11-19 NOTE — Telephone Encounter (Signed)
Waiting for a new CMN to cover through via fax.

## 2017-11-22 ENCOUNTER — Encounter: Payer: Self-pay | Admitting: Gastroenterology

## 2017-11-25 ENCOUNTER — Encounter: Payer: Self-pay | Admitting: Gastroenterology

## 2017-11-25 ENCOUNTER — Other Ambulatory Visit: Payer: Self-pay | Admitting: Primary Care

## 2017-11-25 NOTE — Telephone Encounter (Signed)
Last appt: 11/13/2017  Next appt:  Visit date not found    Recent Lab Values 10/17/2017 10/17/2017 03/31/2017 10/29/2016 10/17/2016 09/25/2016 01/18/2016   EXP DATE 11-03-18 08-04-18 04/04/18 04/04/2017 08/03/17 03/2017 -   [facility] - - - - - - NEG

## 2017-11-26 ENCOUNTER — Ambulatory Visit: Payer: Medicare (Managed Care) | Admitting: Physical Medicine and Rehabilitation

## 2017-11-28 ENCOUNTER — Other Ambulatory Visit: Payer: Self-pay | Admitting: Pulmonary and Critical Care Medicine

## 2017-11-28 ENCOUNTER — Other Ambulatory Visit: Payer: Self-pay | Admitting: Primary Care

## 2017-11-28 DIAGNOSIS — E1165 Type 2 diabetes mellitus with hyperglycemia: Secondary | ICD-10-CM

## 2017-11-28 MED ORDER — ALCOHOL PREP PADS *I*
MEDICATED_PAD | 1 refills | Status: DC
Start: 2017-11-28 — End: 2018-05-12

## 2017-11-28 NOTE — Telephone Encounter (Signed)
Last appt: 11/13/2017  Next appt:  11/28/2017    Recent Lab Values 10/17/2017 10/17/2017 03/31/2017 10/29/2016 10/17/2016 09/25/2016 01/18/2016   EXP DATE 11-03-18 08-04-18 04/04/18 04/04/2017 08/03/17 03/2017 -   [facility] - - - - - - NEG

## 2017-11-28 NOTE — Telephone Encounter (Signed)
Last appt: 11/13/2017  Next appt:  Visit date not found    Recent Lab Values 10/17/2017 10/17/2017 03/31/2017 10/29/2016 10/17/2016 09/25/2016 01/18/2016   EXP DATE 11-03-18 08-04-18 04/04/18 04/04/2017 08/03/17 03/2017 -   [facility] - - - - - - NEG

## 2017-12-01 ENCOUNTER — Other Ambulatory Visit: Payer: Self-pay | Admitting: Psychiatry

## 2017-12-02 ENCOUNTER — Other Ambulatory Visit: Payer: Self-pay

## 2017-12-02 MED ORDER — BUSPIRONE HCL 15 MG PO TABS *I*
30.0000 mg | ORAL_TABLET | Freq: Two times a day (BID) | ORAL | 2 refills | Status: DC
Start: 2017-12-02 — End: 2017-12-08

## 2017-12-02 NOTE — Telephone Encounter (Signed)
Last appt: 11/13/2017  Next appt:  Visit date not found    Recent Lab Values 10/17/2017 10/17/2017 03/31/2017 10/29/2016 10/17/2016 09/25/2016 01/18/2016   EXP DATE 11-03-18 08-04-18 04/04/18 04/04/2017 08/03/17 03/2017 -   [facility] - - - - - - NEG

## 2017-12-04 ENCOUNTER — Other Ambulatory Visit: Payer: Self-pay | Admitting: Primary Care

## 2017-12-05 ENCOUNTER — Telehealth: Payer: Self-pay | Admitting: Endocrinology

## 2017-12-05 NOTE — Telephone Encounter (Signed)
-----   Message from Hilma Favors sent at 12/05/2017  9:29 AM EDT -----  [facility] called regarding her test strips. They said that they need to file an appeal through her medicare advantage plan and that they need a letter of medical necessity as well as a patient consent form signed by the patient.I spoke with Melissa who is handling the claim. Phone: [phone]  [phone]

## 2017-12-05 NOTE — Telephone Encounter (Signed)
Dr. April Manson,    Can you please write a letter of medical necessity for patients [facility] Test Strips.    Thanks,  Mickel Baas

## 2017-12-06 ENCOUNTER — Encounter: Payer: Self-pay | Admitting: Endocrinology

## 2017-12-07 ENCOUNTER — Encounter: Payer: Self-pay | Admitting: Primary Care

## 2017-12-07 ENCOUNTER — Other Ambulatory Visit: Payer: Self-pay | Admitting: Pulmonary and Critical Care Medicine

## 2017-12-07 DIAGNOSIS — Z9109 Other allergy status, other than to drugs and biological substances: Secondary | ICD-10-CM

## 2017-12-08 ENCOUNTER — Encounter: Payer: Self-pay | Admitting: Gastroenterology

## 2017-12-08 ENCOUNTER — Other Ambulatory Visit: Payer: Self-pay

## 2017-12-08 DIAGNOSIS — F419 Anxiety disorder, unspecified: Secondary | ICD-10-CM

## 2017-12-08 MED ORDER — CETIRIZINE HCL 10 MG PO TABS *I*
10.0000 mg | ORAL_TABLET | Freq: Every day | ORAL | 1 refills | Status: DC
Start: 2017-12-08 — End: 2018-05-26

## 2017-12-08 MED ORDER — BUSPIRONE HCL 30 MG PO TABS *I*
30.0000 mg | ORAL_TABLET | Freq: Two times a day (BID) | ORAL | 5 refills | Status: DC
Start: 2017-12-08 — End: 2018-06-03

## 2017-12-08 NOTE — Telephone Encounter (Signed)
Last appt: 11/13/2017  Next appt:  Visit date not found    Recent Lab Values 10/17/2017 10/17/2017 03/31/2017 10/29/2016 10/17/2016 09/25/2016 01/18/2016   EXP DATE 11-03-18 08-04-18 04/04/18 04/04/2017 08/03/17 03/2017 -   [facility] - - - - - - NEG

## 2017-12-08 NOTE — Telephone Encounter (Signed)
Spoke with patient and stated that she would like to be switched back to one 30mg  tablet instead of the two tablets.

## 2017-12-08 NOTE — Telephone Encounter (Signed)
Letter of Medical Necessity faxed to Medicare.

## 2017-12-09 ENCOUNTER — Encounter: Payer: Self-pay | Admitting: Registered Nurse

## 2017-12-10 ENCOUNTER — Encounter: Payer: Self-pay | Admitting: Primary Care

## 2017-12-10 ENCOUNTER — Ambulatory Visit: Payer: Medicare (Managed Care) | Attending: Nutrition | Admitting: Registered Nurse

## 2017-12-10 ENCOUNTER — Telehealth: Payer: Self-pay

## 2017-12-10 ENCOUNTER — Ambulatory Visit: Payer: Medicare (Managed Care) | Admitting: Registered Nurse

## 2017-12-10 ENCOUNTER — Other Ambulatory Visit: Payer: Self-pay

## 2017-12-10 ENCOUNTER — Other Ambulatory Visit: Payer: Self-pay | Admitting: Registered Nurse

## 2017-12-10 DIAGNOSIS — E1165 Type 2 diabetes mellitus with hyperglycemia: Secondary | ICD-10-CM | POA: Insufficient documentation

## 2017-12-10 DIAGNOSIS — IMO0002 Reserved for concepts with insufficient information to code with codable children: Secondary | ICD-10-CM

## 2017-12-10 DIAGNOSIS — E119 Type 2 diabetes mellitus without complications: Secondary | ICD-10-CM

## 2017-12-10 LAB — UNMAPPED LAB RESULTS
Basophil # (HT): 0.1 10 3/uL — NL (ref 0.0–0.2)
Basophil % (HT): 0 % — NL (ref 0–2)
Eosinophil # (HT): 0.2 10 3/uL — NL (ref 0.0–0.5)
Eosinophil % (HT): 1 % — NL (ref 0–7)
Hematocrit (HT): 40 % — NL (ref 40–52)
Hemoglobin (HGB) (HT): 13.1 g/dL — NL (ref 11.5–16.0)
Lymphocyte # (HT): 3.4 10 3/uL — NL (ref 0.9–3.8)
Lymphocyte % (HT): 23 % — NL (ref 17–44)
MCHC (HT): 32.8 g/dL — NL (ref 32.0–36.0)
MCV (HT): 87 fL — NL (ref 81–99)
Mean Corpuscular Hemoglobin (MCH) (HT): 28.6 pg — NL (ref 26.0–34.0)
Monocyte # (HT): 0.7 10 3/uL — NL (ref 0.2–1.0)
Monocyte % (HT): 5 % — NL (ref 4–15)
Neutrophil # (HT): 10.5 10 3/uL — ABNORMAL HIGH (ref 1.5–7.7)
Platelets (HT): 334 10 3/uL — NL (ref 140–400)
RBC (HT): 4.58 10 6/uL — NL (ref 3.80–5.20)
RDW (HT): 13.5 % — NL (ref 11.5–15.0)
Seg Neut % (HT): 71 % — NL (ref 43–75)
WBC (HT): 14.7 10 3/uL — ABNORMAL HIGH (ref 4.0–10.0)

## 2017-12-10 NOTE — Progress Notes (Signed)
Kaylee Harris 42 y.o. female was seen today for diabetes education regarding  uncontrolled type 2 diabetes mellitus. Last seen 05/14/2017 for DSMT; now Omnipod DASH pump .  Inserted 72hr cgm libre per endo to eval trends ;  Post pump fu today.     Diabetes Meds: reviewed in eRecord; patient is taking as prescribed. Trulicity 1.5 mg q week;Humalog vial      Lab Results   Component Value Date    HA1C 7.2 (H) 10/20/2017    HA1C 9.4 (H) 05/13/2017    HA1C 11.0 (H) 02/17/2017    MALBR 0.43 04/23/2017    CREAT 0.90 10/16/2017    LDLC 79 05/13/2017     Self glucose monitoring:  Generic  BG meter 8 times daily. Last 30 days 62 - 379;  fbs  106 - 93; hs 117 ; does NOT enter all bgs as meter not linked;  Severe Sx with 60        BG today:  143 mg/dl    IOB 1.65 units    -- Current management: Omnipod DASH Insulin Pump  -- Insulin pump settings:  -- Basal rates:  12 AM - 0.95 units/hour  3 AM - 1.45 units/hour  6 AM - 1.20 units/hour  10 PM - 0.95 units/hour  --TDD basal insulin: 28.2 units   TDD 52;    Basal 53%; bolus 47%  -- Active insulin time: 4 hours  -- Bolus wizard use: yes  -- Insulin:carb ratio: 1:8 grams  -- Sensitivity: 40 mg/dL  -- Target blood glucose: 140 mg/dL  -- Glucose sensor: none.  72 hr CGM libre insert today     Diet: 3 meals per day ; wt loss per pt  Current Activity  As tolerated  Smoking: no Alcohol: no rare     Self foot check:  self care  Eye Exam inquiry: done    Assessment:   Describes proper skill with insert PODs for pump   Needs PA [employer] [facility] strips for linked meter to pump  DOES NOT enter all bGS from non linked meter  MET goal for new soft sites for sites  Partner present & both demonstrate proper understanding of basal / bolus system  omnipod pump emergency back up plan established  Asking about future dexcom RT CGM system  Coun/Edu:     BARRIERS THAT AFFECT LEARNING:  Mental health ; followed by psych  READINESS TO LEARN:  good  PREFERRED METHODS OF LEARNING:  reading, demonstration  and visual tools  EDUCATION PROVIDED: insulin teaching, BG Goals, using ICR and CF to determine meal bolus and future CGM; risk reduction; skin care with PODS; post pump FU  72 hr CGM    Plan:     Patient Instructions   1   Freestyle LibrePro 72 hour CGM inserted  Inserted into back of upper arm  Check blood sugars 2-3 times per day typically before each meal and at bedtime   Keep log sheets with food intake, medications and dosages, and exercise   Return to [facility] by mail with log sheets  Next appt with [facility] on [place] 12/24/17 RD CDE    2.  Emergency pump failure back up with VGO    3    Await approval test strps for linked meter [facility] strips     DSMT SUPPORT PLAN:  Take advantage of your annual Medicare benefits for DSMT and MNT  Follow-up visit:  01/2018 endo NP;  2 mos RN CDE  Time spent counseling patient: 30 minutes    MCR insurance MD follow up referral is in eRecord    Allowed 2 hrs Individual/group DSMT 1 /2 hrs remain; expires 05/05/2018    Valaria Good, RN CDE

## 2017-12-10 NOTE — Patient Instructions (Signed)
1   Freestyle LibrePro 72 hour CGM inserted  Inserted into back of upper arm  Check blood sugars 2-3 times per day typically before each meal and at bedtime   Keep log sheets with food intake, medications and dosages, and exercise   Return to [facility] by mail with log sheets  Next appt with [facility] on [place] 12/24/17 RD CDE    2.  Emergency pump failure back up with VGO    3    Await approval test strps for linked meter [facility] strips

## 2017-12-10 NOTE — Telephone Encounter (Signed)
From: Kaylee Harris  Sent: 12/10/2017 3:27 PM EDT  To: Olevia Bowens Med Nurse  Subject: follow up    Will Dr. Laurance Flatten follow up with me regarding the bloody noses? I wanted her to get all the info from the ENT and get her in put on the matter. Thank you so much. Kaylee Harris     ----- Message -----  From: Daine Floras, NP  Sent: 12/10/17, 3:23 PM  To: Kaylee Harris  Subject: follow up    Gustavo Lah,    My name is Velna Hatchet and I am the nurse practitioner covering for Dr. Laurance Flatten while she is out of the office. I have added the nsaids to your allergy list and also taken the ibuprofen off of your med list. Have a great day.    Thanks  [employer]

## 2017-12-10 NOTE — Telephone Encounter (Signed)
Kaylee Harris called from [facility] - stated they need more information for her - chart notes and AOR. Please contact her regarding this.  [phone].  If you have the documents she is requesting, you can fax at [phone].

## 2017-12-10 NOTE — Telephone Encounter (Signed)
From: Leata Mouse  Sent: 12/10/2017 3:27 PM EDT  To: Olevia Bowens Med Nurse  Subject: follow up    Will Dr. Laurance Flatten follow up with me regarding the bloody noses? I wanted her to get all the info from the ENT and get her in put on the matter. Thank you so much. Bethena Roys     ----- Message -----  From: Daine Floras, NP  Sent: 12/10/17, 3:23 PM  To: Leata Mouse  Subject: follow up    Gustavo Lah,    My name is Velna Hatchet and I am the nurse practitioner covering for Dr. Laurance Flatten while she is out of the office. I have added the nsaids to your allergy list and also taken the ibuprofen off of your med list. Have a great day.    Thanks  [employer]

## 2017-12-10 NOTE — Telephone Encounter (Signed)
Called and spoke with Representative from [facility], they had the wrong fax number for the office, I provided her with the correct one and she is sending over the necessary paperwork.

## 2017-12-11 ENCOUNTER — Encounter: Payer: Self-pay | Admitting: Psychiatry

## 2017-12-11 LAB — UNMAPPED LAB RESULTS
ABO RH Blood Type (HT): A NEG — NL
Antibody Screen (HT): NEGATIVE — NL
Hematocrit (HT): 39 % — ABNORMAL LOW (ref 40–52)
Hemoglobin (HGB) (HT): 12.2 g/dL — NL (ref 11.5–16.0)
MCHC (HT): 31.7 g/dL — ABNORMAL LOW (ref 32.0–36.0)
MCV (HT): 89 fL — NL (ref 81–99)
Mean Corpuscular Hemoglobin (MCH) (HT): 28 pg — NL (ref 26.0–34.0)
Platelets (HT): 324 10 3/uL — NL (ref 140–400)
RBC (HT): 4.35 10 6/uL — NL (ref 3.80–5.20)
RDW (HT): 13.5 % — NL (ref 11.5–15.0)
WBC (HT): 13.4 10 3/uL — ABNORMAL HIGH (ref 4.0–10.0)

## 2017-12-11 LAB — BASIC METABOLIC PANEL
Anion Gap: 8 mEq/L (ref 7–16)
CO2: 23 mEq/L (ref 20–31)
Calcium: 9.3 mg/dL (ref 8.6–10.2)
Chloride: 109 mEq/L (ref 99–109)
Creatinine: 0.9 mg/dL (ref 0.50–1.10)
Glucose: 125 mg/dL — ABNORMAL HIGH (ref 60–99)
Lab: 13 mg/dL (ref 9–23)
Potassium: 3.7 mEq/L (ref 3.5–5.1)
Sodium: 140 mEq/L (ref 136–145)

## 2017-12-11 LAB — CBC AND DIFFERENTIAL
Baso # K/uL: 0.1 10*3/uL (ref 0.0–0.2)
Basophil %: 0 % (ref 0–2)
Eos # K/uL: 0.2 10*3/uL (ref 0.0–0.5)
Eosinophil %: 1 % (ref 0–7)
Hematocrit: 40 % (ref 40–52)
Hemoglobin: 13.1 g/dL (ref 11.5–16.0)
Lymph # K/uL: 3.4 10*3/uL (ref 0.9–3.8)
Lymphocyte %: 23 % (ref 17–44)
MCH: 28.6 pg (ref 26.0–34.0)
MCHC: 32.8 g/dL (ref 32.0–36.0)
MCV: 87 fL (ref 81–99)
Mono # K/uL: 0.7 10*3/uL (ref 0.2–1.0)
Monocyte %: 5 % (ref 4–15)
Neut # K/uL: 10.5 10*3/uL — ABNORMAL HIGH (ref 1.5–7.7)
Platelets: 334 10*3/uL (ref 140–400)
RBC: 4.58 10*6/uL (ref 3.80–5.20)
RDW: 13.5 % (ref 11.5–15.0)
Seg Neut %: 71 % (ref 43–75)
WBC: 14.7 10*3/uL — ABNORMAL HIGH (ref 4.0–10.0)

## 2017-12-11 LAB — PREGNANCY TEST, SERUM: Preg,Serum: NEGATIVE

## 2017-12-11 LAB — ESTIMATED GFR
GFR,Black: >60 mL/min
GFR,Caucasian: >60 mL/min

## 2017-12-12 ENCOUNTER — Other Ambulatory Visit: Payer: Self-pay | Admitting: Gastroenterology

## 2017-12-12 ENCOUNTER — Encounter: Payer: Self-pay | Admitting: Primary Care

## 2017-12-12 HISTORY — PX: SHX DX CEREBRAL ANGIOGRAM: SHX210350001

## 2017-12-12 LAB — UNMAPPED LAB RESULTS
ABO RH Blood Type (HT): A NEG — NL
Antibody Screen (HT): NEGATIVE — NL
Basophil # (HT): 0.1 10 3/uL — NL (ref 0.0–0.2)
Basophil # (HT): 0.1 10 3/uL — NL (ref 0.0–0.2)
Basophil % (HT): 1 % — NL (ref 0–3)
Basophil % (HT): 0 % — NL (ref 0–3)
Eosinophil # (HT): 0.2 10 3/uL — NL (ref 0.0–0.6)
Eosinophil # (HT): 0.1 10 3/uL — NL (ref 0.0–0.6)
Eosinophil % (HT): 2 % — NL (ref 0–5)
Eosinophil % (HT): 1 % — NL (ref 0–5)
Hematocrit (HT): 38 % — NL (ref 35–47)
Hematocrit (HT): 39 % — NL (ref 35–47)
Hemoglobin (HGB) (HT): 11.8 g/dL — ABNORMAL LOW (ref 12.0–16.0)
Hemoglobin (HGB) (HT): 12 g/dL — NL (ref 12.0–16.0)
Lymphocyte # (HT): 3.9 10 3/uL — NL (ref 1.0–4.8)
Lymphocyte # (HT): 1.3 10 3/uL — NL (ref 1.0–4.8)
Lymphocyte % (HT): 32 % — NL (ref 15–45)
Lymphocyte % (HT): 9 % — ABNORMAL LOW (ref 15–45)
MCHC (HT): 30.9 g/dL — ABNORMAL LOW (ref 31.0–37.5)
MCHC (HT): 30.8 g/dL — ABNORMAL LOW (ref 31.0–37.5)
MCV (HT): 91 fL — NL (ref 80–100)
MCV (HT): 92 fL — NL (ref 80–100)
Mean Corpuscular Hemoglobin (MCH) (HT): 28 pg — NL (ref 26.0–34.0)
Mean Corpuscular Hemoglobin (MCH) (HT): 28.4 pg — NL (ref 26.0–34.0)
Monocyte # (HT): 0.6 10 3/uL — NL (ref 0.1–1.0)
Monocyte # (HT): 0.2 10 3/uL — NL (ref 0.1–1.0)
Monocyte % (HT): 5 % — NL (ref 0–15)
Monocyte % (HT): 1 % — NL (ref 0–15)
Neutrophil # (HT): 7.3 10 3/uL — NL (ref 1.8–8.0)
Neutrophil # (HT): 12.1 10 3/uL — ABNORMAL HIGH (ref 1.8–8.0)
Platelets (HT): 314 10 3/uL — NL (ref 150–450)
Platelets (HT): 286 10 3/uL — NL (ref 150–450)
RBC (HT): 4.21 10 6/uL — NL (ref 3.80–5.20)
RBC (HT): 4.22 10 6/uL — NL (ref 3.80–5.20)
RDW (HT): 13.1 % — NL (ref 0.0–15.2)
RDW (HT): 13.1 % — NL (ref 0.0–15.2)
Seg Neut % (HT): 60 % — NL (ref 45–75)
Seg Neut % (HT): 88 % — ABNORMAL HIGH (ref 45–75)
WBC (HT): 12.1 10 3/uL — ABNORMAL HIGH (ref 4.0–11.0)
WBC (HT): 13.8 10 3/uL — ABNORMAL HIGH (ref 4.0–11.0)

## 2017-12-13 LAB — UNMAPPED LAB RESULTS
Basophil # (HT): 0 10 3/uL — NL (ref 0.0–0.2)
Basophil % (HT): 0 % — NL (ref 0–3)
Eosinophil # (HT): 0 10 3/uL — NL (ref 0.0–0.6)
Eosinophil % (HT): 0 % — NL (ref 0–5)
Hematocrit (HT): 41 % — NL (ref 35–47)
Hemoglobin (HGB) (HT): 12.6 g/dL — NL (ref 12.0–16.0)
Lymphocyte # (HT): 1.6 10 3/uL — NL (ref 1.0–4.8)
Lymphocyte % (HT): 10 % — ABNORMAL LOW (ref 15–45)
MCHC (HT): 31.1 g/dL — NL (ref 31.0–37.5)
MCV (HT): 89 fL — NL (ref 80–100)
Mean Corpuscular Hemoglobin (MCH) (HT): 27.6 pg — NL (ref 26.0–34.0)
Monocyte # (HT): 0.4 10 3/uL — NL (ref 0.1–1.0)
Monocyte % (HT): 2 % — NL (ref 0–15)
Neutrophil # (HT): 14.9 10 3/uL — ABNORMAL HIGH (ref 1.8–8.0)
Platelets (HT): 375 10 3/uL — NL (ref 150–450)
RBC (HT): 4.56 10 6/uL — NL (ref 3.80–5.20)
RDW (HT): 12.7 % — NL (ref 0.0–15.2)
Seg Neut % (HT): 87 % — ABNORMAL HIGH (ref 45–75)
WBC (HT): 17 10 3/uL — ABNORMAL HIGH (ref 4.0–11.0)

## 2017-12-14 ENCOUNTER — Other Ambulatory Visit: Payer: Self-pay | Admitting: Gastroenterology

## 2017-12-14 ENCOUNTER — Other Ambulatory Visit: Payer: Self-pay

## 2017-12-14 LAB — UNMAPPED LAB RESULTS
Basophil # (HT): 0.1 10 3/uL — NL (ref 0.0–0.2)
Basophil % (HT): 0 % — NL (ref 0–3)
Eosinophil # (HT): 0.1 10 3/uL — NL (ref 0.0–0.6)
Eosinophil % (HT): 1 % — NL (ref 0–5)
Hematocrit (HT): 42 % — NL (ref 35–47)
Hemoglobin (HGB) (HT): 13.3 g/dL — NL (ref 12.0–16.0)
Lymphocyte # (HT): 4.3 10 3/uL — NL (ref 1.0–4.8)
Lymphocyte % (HT): 26 % — NL (ref 15–45)
MCHC (HT): 31.9 g/dL — NL (ref 31.0–37.5)
MCV (HT): 88 fL — NL (ref 80–100)
Mean Corpuscular Hemoglobin (MCH) (HT): 28 pg — NL (ref 26.0–34.0)
Monocyte # (HT): 0.7 10 3/uL — NL (ref 0.1–1.0)
Monocyte % (HT): 4 % — NL (ref 0–15)
Neutrophil # (HT): 11.1 10 3/uL — ABNORMAL HIGH (ref 1.8–8.0)
Platelets (HT): 372 10 3/uL — NL (ref 150–450)
RBC (HT): 4.75 10 6/uL — NL (ref 3.80–5.20)
RDW (HT): 13.1 % — NL (ref 0.0–15.2)
Seg Neut % (HT): 68 % — NL (ref 45–75)
WBC (HT): 16.5 10 3/uL — ABNORMAL HIGH (ref 4.0–11.0)

## 2017-12-14 LAB — BHCG, QUANT PREGNANCY: BHCG, QUANT PREGNANCY: <2 m[IU]/mL

## 2017-12-14 LAB — CBC AND DIFFERENTIAL
Baso # K/uL: 0.1 10*3/uL (ref 0.0–0.2)
Basophil %: 0 % (ref 0–3)
Eos # K/uL: 0.1 10*3/uL (ref 0.0–0.6)
Eosinophil %: 1 % (ref 0–5)
Hematocrit: 42 % (ref 35–47)
Hemoglobin: 13.3 g/dL (ref 12.0–16.0)
Lymph # K/uL: 4.3 10*3/uL (ref 1.0–4.8)
Lymphocyte %: 26 % (ref 15–45)
MCH: 28 pg (ref 26.0–34.0)
MCHC: 31.9 g/dL (ref 31.0–37.5)
MCV: 88 fL (ref 80–100)
Mono # K/uL: 0.7 10*3/uL (ref 0.1–1.0)
Monocyte %: 4 % (ref 0–15)
Neut # K/uL: 11.1 10*3/uL — ABNORMAL HIGH (ref 1.8–8.0)
Platelets: 372 10*3/uL (ref 150–450)
RBC: 4.75 10*6/uL (ref 3.80–5.20)
RDW: 13.1 % (ref 0.0–15.2)
Seg Neut %: 68 % (ref 45–75)
WBC: 16.5 10*3/uL — ABNORMAL HIGH (ref 4.0–11.0)

## 2017-12-14 LAB — BASIC METABOLIC PANEL
Anion Gap: 10 mEq/L (ref 4–16)
CO2: 24 mEq/L (ref 20–31)
Calcium: 9.3 mg/dL (ref 8.5–10.4)
Chloride: 108 mEq/L (ref 98–108)
Creatinine: 0.9 mg/dL (ref 0.5–0.9)
Glucose: 120 mg/dL — ABNORMAL HIGH (ref 65–100)
Lab: 15 mg/dL (ref 8–20)
Potassium: 3.7 mEq/L (ref 3.5–5.1)
Sodium: 142 mEq/L (ref 135–145)

## 2017-12-14 LAB — ESTIMATED GFR
GFR,Black: >60 mL/min
GFR,Caucasian: >60 mL/min

## 2017-12-15 DIAGNOSIS — F4310 Post-traumatic stress disorder, unspecified: Secondary | ICD-10-CM

## 2017-12-15 DIAGNOSIS — F3340 Major depressive disorder, recurrent, in remission, unspecified: Secondary | ICD-10-CM

## 2017-12-15 DIAGNOSIS — F422 Mixed obsessional thoughts and acts: Secondary | ICD-10-CM

## 2017-12-15 DIAGNOSIS — F603 Borderline personality disorder: Secondary | ICD-10-CM

## 2017-12-15 NOTE — BH Discharge Summary (Addendum)
[facility]ulatory Discharge Summary       R PSY DATE ADMITTED 12/15/2017   DATE ADMITTED TO CURRENT [place] PROGRAM 04/20/2013     R PSY DC DATE 12/15/2017   DISCHARGE DATE 12/15/2017     R PSY PROGRAM 12/15/2017   Program Adult Ambulatory: Clinic     R PSY REFERRAL SOURCE 12/15/2017   REFERRAL SOURCE Primary Care Physician   Comments -     R PSY PRESENTING PROBLEM 12/15/2017   PRESENTING PROBLEM Depression and anxiety       Discharge Diagnosis      ICD-10-CM ICD-9-CM   1. Mixed obsessional thoughts and acts F42.2 300.3   2. Borderline personality disorder F60.3 301.83   3. 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 PTSD (post-traumatic stress disorder) F43.10 309.81         Summary of Treatment: (interventions,progress, # of visits, response to medications): Patient was referred to [facility] by her PCP for [facility] treatment and was seen on 04/15/13 by Barnie Alderman, PhD for intake. Patient transitioned to Tresa Res, LCSW-R (wrtier) on 04/20/13 for ongoing treatment. Patient presented with heightened SI and was referred to CPEP on 06/01/13, admitted, then discharged to [facility] for two weeks of treatment. Patient returned to treatment with Probation officer and met with Probation officer weekly. Patient was referred for medication management and met with [name], NP on 07/27/13. Patient processed issues related to past trauma, ongoing interpersonal issues, and medical issues - POTS. Patient was referred to DBT group and attended but did not complete all three modules. Patient was awarded Social Security disability while in treatment.     Condition at Time of Discharge: (symptoms, functioning, unresolved problems, recommendations for future care): Patient's depression and SI had resolved. Patient continued to endorse OCD and PTSD symptoms although her PTSD symptoms had decreased somewhat. Patient declined treatment for OCD and PTSD. Patient would benefit from CPT, EPT, and DBT if  she returns to treatment in the future.    IS FURTHER MENTAL HEALTH SERVICE, INCLUDING PSYCHOPHARMACOTHERAPY, RECOMMENDED AT THIS TIME? No  If "No" or "Yes, but patient has dropped out of care," skip to Discharge Instructions.    DOES PATIENT DISAGREE WITH DISCHARGE FROM THIS PROGRAM? No :  If Yes, Medical Director's signature is required.    R PSY DC TO 12/15/2017   DISCHARGED TO Community            DISCHARGE INSTRUCTIONS:     We have provided enough medication to last you until your next psychiatric assessment.  Refills or changes in medication must be done by your new Outpatient Provider.  It is VITAL that you keep your scheduled appointments.    Current Outpatient Prescriptions on File Prior to Visit   Medication Sig Dispense Refill    cetirizine (ZYRTEC) 10 MG tablet Take 1 tablet (10 mg total) by mouth daily 90 tablet 1    busPIRone (BUSPAR) 30 MG tablet Take 1 tablet (30 mg total) by mouth 2 times daily 60 tablet 5    famciclovir (FAMVIR) 500 MG tablet TAKE 1 TABLET BY MOUTH THREE TIMES DAILY AS NEEDED 30 tablet 5    Alcohol Swabs (ALCOHOL PREP) PADS Use BID for BG checkUse BID for BG check 100 each 1    Insulin Disposable Pump (OMNIPOD DASH 5 PACK) MISC By 1 Device no specified route as needed 9 each 3    blood glucose test strip Test  8 times a day.  Brand name of strips [facility] test strips for linked omnipod pump 250 strip 11    lancets (BAYER MICROLET) Use   8  times per day as instructed for blood glucose testing. 250 each 11    traMADol (ULTRAM) 50 MG tablet Take 1 tablet (50 mg total) by mouth every 8 hours as needed (for head pain and abd pain)   Max daily dose: 150 mg 42 tablet 0    topiramate (TOPAMAX) 100 MG tablet Take 1 tablet (100 mg total) by mouth 2 times daily 60 tablet 5    pantoprazole (PROTONIX) 40 MG EC tablet Take 1 tablet (40 mg total) by mouth daily   SWALLOW WHOLE. DO NOT CRUSH, BREAK, OR CHEW. 30 tablet 5    DULoxetine (CYMBALTA) 30 MG DR capsule Take 1 capsule (30  mg total) by mouth 2 times daily 180 capsule 3    mirtazapine (REMERON) 15 MG tablet Take 1 tablet (15 mg total) by mouth nightly 90 tablet 3    lamoTRIgine (LAMICTAL) 150 MG tablet Take 1 tablet (150 mg total) by mouth daily 90 tablet 0    azelastine (OPTIVAR) 0.05 % ophthalmic solution Place 1 drop into both eyes 2 times daily      SUMAtriptan refill (IMITREX STATDOSE) 6 MG/0.5ML injection INJECT 0.5ML UNDER THE SKIN ONCE AS NEEDED FOR MIGRAINE. MAY REPEAT DOSE ONCE AFTER 1 HOUR 2 Syringe 5    dulaglutide (TRULICITY) 1.5 XB/1.4NW injection pen Inject 0.5 mLs (1.5 mg total) into the skin every 7 days 6 mL 3    glucagon (GLUCAGEN) 1 MG injection kit Inject 1 mg into the skin once as needed for Low blood sugar   Discard any unused portion. 1 each 5    clonazePAM (KLONOPIN) 0.5 MG tablet Take 1 tablet (0.5 mg total) by mouth daily as needed   Max daily dose: 0.5 mg 28 tablet 0    promethazine (PHENERGAN) 25 MG tablet Take 1 tablet (25 mg total) by mouth every 4-6 hours as needed for Nausea 30 tablet 3    insulin lispro 100 UNIT/ML injection vial Inject as directed up to MDD 100 units 40 mL 11    SUMAtriptan (IMITREX) 50 MG tablet TAKE 1 TABLET BY MOUTH AS NEEDED FOR MIGRAINE, TAKE AT ONSET OF HEADACHE. MAY REPEAT ONCE IN 2 HOURS 9 tablet 5    fluticasone (FLONASE) 50 MCG/ACT nasal spray 1 spray by Nasal route daily 48 g 3    SUMAVEL DOSEPRO 6 MG/0.5ML needle-free injection INJECT 0.5MLS (6MG TOTAL) UNDER THE SKIN AS NEEDED FOR MIGRAINE MAY REPEAT ONCE AFTER 1 HOUR IF NEEDED  1    atorvastatin (LIPITOR) 40 MG tablet Take 1 tablet (40 mg total) by mouth daily (with dinner) 90 tablet 3    albuterol HFA 108 (90 Base) MCG/ACT inhaler Inhale 1-2 puffs into the lungs every 6 hours as needed for Wheezing   Shake well before each use. 1 Inhaler 5    metoprolol (TOPROL-XL) 25 MG 24 hr tablet Take by mouth daily         ascorbic acid (VITAMIN C) 100 MG tablet Take 100 mg by mouth daily      midodrine  (PROAMATINE) 10 MG tablet Take 1 tablet (10 mg total) by mouth 3 times daily 90 tablet 0    insulin syringe-needle U-100 (BD ULTRAFINE) 31G X 5/16" 0.3 ML HALF-UNIT Use 3 times a day as instructed. 100 each 0    Non-System Medication The above patient is followed  in our clinic and cannot resume work permanently. 1 each 1     No current facility-administered medications on file prior to visit.        Psychopharmacology Provider Comments: Patient's PCP has agreed to prescribe her psychiatric medications.      Discharge report sent or faxed to PCP       According to [place] [facility] Standards, patients will be assigned to the Psychiatrists/NP by the receiving agency therapist after their first appointment.  The receiving agency is responsible for the patient's psychiatric management once the patient begins services with that agency.    REASON FOR DISCHARGE:     01  TRTMT COMPLETED/ PROGRAM TERMINATED      GENERAL INSTRUCTIONS: Patient had treatment completion/relapse prevention via telephone. Patient advised to return to treatment in future if necessary.    [employer] and [employer] Team: (24 hours/7 days) [phone], [phone], (Out of [place]) [phone]         The above information has been discussed with me and I have received a copy. I understand that I am advised to follow the instructions given to me at appropriately care for my condition.    Patient Signature ______________________________________________________    Date: __________________________    Patient Representative Signature, if required:  ____________________________________________________________________    Date: __________________________

## 2017-12-16 ENCOUNTER — Encounter: Payer: Self-pay | Admitting: Registered Nurse

## 2017-12-16 ENCOUNTER — Telehealth: Payer: Self-pay | Admitting: Pulmonary and Critical Care Medicine

## 2017-12-16 NOTE — Telephone Encounter (Signed)
Writer called pt to follow up on hospital admission. Pt stated she feels horrible after the embolization, experiencing pain in her "stomach and also having blood in her urine, clots and feels like she needs to strain to get the urine out" Pt states she contacted admitting doctor in regards to her pain and was told that this abd pain does not seem to be related to her procedure and that she should be checked for any clotting abnormalities.     Pt already has an appt schedule for tomorrow, 12/17/17 with NP.

## 2017-12-16 NOTE — Telephone Encounter (Signed)
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 Please call her and see how she is doing. Any nose bleeds? Offer her a visit if needed. Thanks. NG

## 2017-12-17 ENCOUNTER — Ambulatory Visit: Payer: Medicare (Managed Care) | Attending: Primary Care | Admitting: Primary Care

## 2017-12-17 ENCOUNTER — Encounter: Payer: Self-pay | Admitting: Primary Care

## 2017-12-17 VITALS — HR 102 | Temp 97.1°F | Ht 67.99 in | Wt 262.0 lb

## 2017-12-17 DIAGNOSIS — R319 Hematuria, unspecified: Secondary | ICD-10-CM | POA: Insufficient documentation

## 2017-12-17 LAB — POCT URINALYSIS DIPSTICK
Glucose,UA POCT: NORMAL mg/dL
Ketones,UA POCT: NEGATIVE mg/dL
Leuk Esterase,UA POCT: NEGATIVE
Lot #: 32222103
Nitrite,UA POCT: NEGATIVE
PH,UA POCT: 5 (ref 5–8)
Specific gravity,UA POCT: 1.01 (ref 1.002–1.030)

## 2017-12-17 NOTE — Progress Notes (Signed)
[place]    Kaylee Harris is a 42 y.o. female who presents, for Hematuria (pt has blood in her urine with foul odor )      Patient presents to the office with complaints of hematuria and foul odor.  Patient with recent hospitalization for surgery requiring catheterization.  She denies dysuria, but reports bladder/pelvic pressure.  She is experiencing mild intermittent abdominal pain, denies any vaginal s/s.  Patient has an IUD as contraception.  She reports that she has had these types of s/s before her surgery.    I personally reviewed the patient's medications, allergies, medical history and updated as appropriate.      Current Outpatient Prescriptions   Medication Sig    cetirizine (ZYRTEC) 10 MG tablet Take 1 tablet (10 mg total) by mouth daily    busPIRone (BUSPAR) 30 MG tablet Take 1 tablet (30 mg total) by mouth 2 times daily    famciclovir (FAMVIR) 500 MG tablet TAKE 1 TABLET BY MOUTH THREE TIMES DAILY AS NEEDED    topiramate (TOPAMAX) 100 MG tablet Take 1 tablet (100 mg total) by mouth 2 times daily    pantoprazole (PROTONIX) 40 MG EC tablet Take 1 tablet (40 mg total) by mouth daily   SWALLOW WHOLE. DO NOT CRUSH, BREAK, OR CHEW.    DULoxetine (CYMBALTA) 30 MG DR capsule Take 1 capsule (30 mg total) by mouth 2 times daily    mirtazapine (REMERON) 15 MG tablet Take 1 tablet (15 mg total) by mouth nightly    lamoTRIgine (LAMICTAL) 150 MG tablet Take 1 tablet (150 mg total) by mouth daily    dulaglutide (TRULICITY) 1.5 FT/7.3UK injection pen Inject 0.5 mLs (1.5 mg total) into the skin every 7 days    insulin lispro 100 UNIT/ML injection vial Inject as directed up to MDD 100 units    fluticasone (FLONASE) 50 MCG/ACT nasal spray 1 spray by Nasal route daily    atorvastatin (LIPITOR) 40 MG tablet Take 1 tablet (40 mg total) by mouth daily (with dinner)    metoprolol (TOPROL-XL) 25 MG 24 hr tablet Take by mouth daily       ascorbic acid (VITAMIN C) 100 MG tablet Take  100 mg by mouth daily    midodrine (PROAMATINE) 10 MG tablet Take 1 tablet (10 mg total) by mouth 3 times daily    Alcohol Swabs (ALCOHOL PREP) PADS Use BID for BG checkUse BID for BG check    Insulin Disposable Pump (OMNIPOD DASH 5 PACK) MISC By 1 Device no specified route as needed    blood glucose test strip Test  8 times a day.   Brand name of strips Contour NEXT test strips for linked omnipod pump    lancets (BAYER MICROLET) Use   8  times per day as instructed for blood glucose testing.    traMADol (ULTRAM) 50 MG tablet Take 1 tablet (50 mg total) by mouth every 8 hours as needed (for head pain and abd pain)   Max daily dose: 150 mg    azelastine (OPTIVAR) 0.05 % ophthalmic solution Place 1 drop into both eyes 2 times daily    SUMAtriptan refill (IMITREX STATDOSE) 6 MG/0.5ML injection INJECT 0.5ML UNDER THE SKIN ONCE AS NEEDED FOR MIGRAINE. MAY REPEAT DOSE ONCE AFTER 1 HOUR    glucagon (GLUCAGEN) 1 MG injection kit Inject 1 mg into the skin once as needed for Low blood sugar   Discard any unused portion.  clonazePAM (KLONOPIN) 0.5 MG tablet Take 1 tablet (0.5 mg total) by mouth daily as needed   Max daily dose: 0.5 mg    promethazine (PHENERGAN) 25 MG tablet Take 1 tablet (25 mg total) by mouth every 4-6 hours as needed for Nausea    SUMAtriptan (IMITREX) 50 MG tablet TAKE 1 TABLET BY MOUTH AS NEEDED FOR MIGRAINE, TAKE AT ONSET OF HEADACHE. MAY REPEAT ONCE IN 2 HOURS    SUMAVEL DOSEPRO 6 MG/0.5ML needle-free injection INJECT 0.5MLS (6MG TOTAL) UNDER THE SKIN AS NEEDED FOR MIGRAINE MAY REPEAT ONCE AFTER 1 HOUR IF NEEDED    albuterol HFA 108 (90 Base) MCG/ACT inhaler Inhale 1-2 puffs into the lungs every 6 hours as needed for Wheezing   Shake well before each use.    insulin syringe-needle U-100 (BD ULTRAFINE) 31G X 5/16" 0.3 ML HALF-UNIT Use 3 times a day as instructed.    Non-System Medication The above patient is followed in our clinic and cannot resume work permanently.       ROS  See  pertinent HPI      OBJECTIVE  Vitals:    12/17/17 1534   Pulse: 102   Temp: 36.2 C (97.1 F)   SpO2: 98%   Weight: 118.8 kg (262 lb)   Height: 1.727 m (5' 7.99")     Body mass index is 39.85 kg/m.      GENERAL: A&Ox3, pleasant, cooperative, well groomed, appropriately dressed, actively engaged in encounter, NAD.  LUNGS: Normal resp effort. CTAB.  No adventitious breath sounds noted.  Full and symmetric lung expansion.  CARDIAC: RRR, S1 S2, no murmers, gallops, rubs.  No pedal edema. Pulses 2+ b/l.     ABDOMEN: Abdomen non-distended,soft, no palpable masses, BS active x 4, negative tenderness, rebound, or guarding, no CVAT   PSYCH: AAOx3, normal affect and mood. Insight and judgement intact.         Lab results: 12/17/17  1550   Glucose,UA POCT Normal   Ketones,UA POCT Negative   Specific gravity,UA POCT 1.010   Blood,UA POCT Trace*   PH,UA POCT 5.0   Protein,UA POCT Trace*   Nitrite,UA POCT Negative   Leuk Esterase,UA POCT Negative           ASSESSMENT & PLAN    1. Hematuria, unspecified type    - POCT Urinalysis Dipstick  - Aerobic culture; Future  - Aerobic culture    Patient Instructions   1. Urine culture.  2. Increase water intake.  3. FOllow up via mychart.      Follow-up: Return if symptoms worsen or fail to improve.      Patient verbalized understanding of information presented,and confirmed agreement with current plan of care.  Reviewed risks, benefits, and administration of prescribed/refilled medications. Precautions reviewed.      Graceann Congress, FNP-C  12/17/2017  4:41 PM

## 2017-12-17 NOTE — Patient Instructions (Signed)
1. Urine culture.  2. Increase water intake.  3. FOllow up via mychart.

## 2017-12-18 ENCOUNTER — Encounter: Payer: Self-pay | Admitting: Psychiatry

## 2017-12-18 ENCOUNTER — Encounter: Payer: Self-pay | Admitting: Primary Care

## 2017-12-18 DIAGNOSIS — I440 Atrioventricular block, first degree: Secondary | ICD-10-CM | POA: Insufficient documentation

## 2017-12-19 ENCOUNTER — Encounter: Payer: Self-pay | Admitting: Primary Care

## 2017-12-19 MED ORDER — SULFAMETHOXAZOLE-TRIMETHOPRIM 800-160 MG PO TABS *I*
1.0000 | ORAL_TABLET | Freq: Two times a day (BID) | ORAL | 0 refills | Status: AC
Start: 2017-12-19 — End: 2017-12-24

## 2017-12-20 LAB — AEROBIC CULTURE

## 2017-12-22 ENCOUNTER — Other Ambulatory Visit: Payer: Self-pay

## 2017-12-22 DIAGNOSIS — F603 Borderline personality disorder: Secondary | ICD-10-CM

## 2017-12-22 DIAGNOSIS — F319 Bipolar disorder, unspecified: Secondary | ICD-10-CM

## 2017-12-22 MED ORDER — LAMOTRIGINE 150 MG PO TABS *I'
150.0000 mg | ORAL_TABLET | Freq: Every day | ORAL | 0 refills | Status: DC
Start: 2017-12-22 — End: 2018-01-27

## 2017-12-22 NOTE — Telephone Encounter (Signed)
Last appt: 12/17/2017  Next appt:  Visit date not found    Recent Lab Values 12/17/2017 10/17/2017 10/17/2017 03/31/2017 10/29/2016 10/17/2016 09/25/2016   EXP DATE 02/02/2018 11-03-18 08-04-18 04/04/18 04/04/2017 08/03/17 03/2017   [facility] - - - - - - -

## 2017-12-24 ENCOUNTER — Ambulatory Visit: Payer: Medicare (Managed Care) | Attending: Endocrinology | Admitting: Nutrition

## 2017-12-24 ENCOUNTER — Ambulatory Visit: Payer: Medicare (Managed Care) | Admitting: Nutrition

## 2017-12-24 VITALS — Wt 266.2 lb

## 2017-12-24 DIAGNOSIS — E1165 Type 2 diabetes mellitus with hyperglycemia: Secondary | ICD-10-CM | POA: Insufficient documentation

## 2017-12-24 NOTE — Progress Notes (Signed)
Kaylee Harris is here today for [facility] regarding Insulin requiring Type 2 Diabetes. Referred by: PCP  Here with wife, [name]  Recent nose sx for sever nose bleed. On prednisone   Wt loss going well   Omni pod  Going well    Using VGO insulin delivery but changing to Omnipod   On Trulicity meds covered by [facility] Readings from Last 3 Encounters:   12/24/17 120.7 kg (266 lb 3.2 oz)   12/17/17 118.8 kg (262 lb)   11/18/17 124.1 kg (273 lb 9.6 oz)     Lab Results   Component Value Date    HA1C 7.2 (H) 10/20/2017    HA1C 9.4 (H) 05/13/2017    HA1C 11.0 (H) 02/17/2017    MALBR 0.43 04/23/2017    CREAT 0.9 12/14/2017    LDLC 79 05/13/2017       Diet: 2 meals + grazing on snacks;  not going to program anymore;    Beverages: Powerade Zero; water, coffee, creamer   ETOH: no  Exercise : sedentary  ; dizzy spells  DM Meds: VGO Taking as prescribed.       Meter:  Freestyle lite 3 time daily,  Ac meals    Assessment:   DM- excellent DM control with pump  Reviewed extended bolus  Reviewed how to bolus for carbs only w/o entering BG   Reviewed using calorie king book or app; chain restaurant websites     Obesity  -down 20 lb in 6 months Exercise very limited    Coun/Edu:     Barriers to learning:  Neuro problems  Education Provided: Plate Method label reading and carb factors    Plan:   Nutrition Goals:   1500 calories  150-180 gm carbs  Initial Weight goal: (-10%) 270 lb    Follow-up visit: 4 mo   Time spent counseling patient: 30 minutes    [employer] insurance MD follow up referral is in eRecord  .  Extra MNT hrs ordered  MNT 1.5 hrs remain   Expires 05/05/18    Marjo Bicker, RD

## 2018-01-04 ENCOUNTER — Ambulatory Visit
Admission: AD | Admit: 2018-01-04 | Discharge: 2018-01-04 | Disposition: A | Discharge status: Home / Self Care | Payer: Medicare (Managed Care) | Source: Ambulatory Visit | Attending: Nurse Practitioner | Admitting: Nurse Practitioner

## 2018-01-04 DIAGNOSIS — H6692 Otitis media, unspecified, left ear: Secondary | ICD-10-CM | POA: Insufficient documentation

## 2018-01-04 DIAGNOSIS — R062 Wheezing: Secondary | ICD-10-CM

## 2018-01-04 DIAGNOSIS — J029 Acute pharyngitis, unspecified: Secondary | ICD-10-CM | POA: Insufficient documentation

## 2018-01-04 LAB — POCT AMBULATORY RAPID STREP
Lot #: 191143
Rapid Strep Group A Throat-POC: NEGATIVE

## 2018-01-04 MED ORDER — AMOXICILLIN-POT CLAVULANATE 875-125 MG PO TABS *I*
1.0000 | ORAL_TABLET | Freq: Two times a day (BID) | ORAL | 0 refills | Status: AC
Start: 2018-01-04 — End: 2018-01-14

## 2018-01-04 NOTE — ED Triage Notes (Signed)
Patient c/o a sore throat for the last 4 days. Patient also has a productive cough along with some wheezing and left ear discomfort.     Triage Note   Marius Ditch, RN

## 2018-01-04 NOTE — Discharge Instructions (Signed)
Orders Placed This Encounter    Strep A culture, throat    POCT rapid strep    amoxicillin-clavulanate (AUGMENTIN) 875-125 MG per tablet       Recent Results (from the past 24 hour(s))   POCT rapid strep    Collection Time: 01/04/18 12:03 PM   Result Value Ref Range    Rapid Strep Group A Throat-POC Negative for Streptococcus Group A Antigen Negative    INTERNAL CONTROL RAPID STREP POCT *Yes-internal procedural control(s) acceptable     Exp date 9/20     Lot # [id number]            Final Diagnosis    ICD-10-CM ICD-9-CM   1. Left otitis media, unspecified otitis media type H66.92 382.9       Encourage fluids, encourage rest, good hand hygiene.    Use over the counter medications as discussed.    Please start the new medications as below:    Current Discharge Medication List        New Medications    Details Last Dose Given Next Dose Due Script Given?   amoxicillin-clavulanate (AUGMENTIN) 1 tablet Dose: 1 tablet  Take 1 tablet by mouth 2 times daily  Quantity 20 tablet, Refill 0  Start date: 01/04/2018, End date: 01/14/2018       Comments: Emergency Encounter                     Please follow up with your physician as below:    Follow-up Information     [name], MD In 1 week.    Specialties:  Primary Care, Family Medicine  Contact information:  [address]  [address]  [place] [address]  [phone]                 Thank you DANE BLOCH for coming to [facility] for your health care concerns.    If your condition changes and/or worsens please follow up with her primary doctor and/or return to the urgent care center.    If short of breath, chest pains or any other concerns please report to the emergency room.    In the event of an Emergency dial 911.

## 2018-01-04 NOTE — ED Notes (Signed)
Pt discharged home. Pt verbalized understanding of all discharge instructions including follow-up. All questions answered. Pt belonging accounted for and taken by patient at discharge.

## 2018-01-04 NOTE — UC Provider Note (Signed)
History     Chief Complaint   Patient presents with    Sore Throat     Patient c/o a sore throat for the last 4 days. Patient also has a productive cough along with some wheezing and left ear discomfort.     Cough     42 year old female who presents today with left ear pain, sore throat and wheezing over last 4 days, she reports she was exposed directly to strep from a family member.  Has history of asthma and was utilized for albuterol several times yesterday.  She reports she has been hospitalized to use for her asthma, has never been intubated.  Denies fever or chills, has some fatigue denies abdominal pain or rash.            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 Lower abdomen with cellulitis changes, wound probed and purulent material expressed.  Had PICC line for "multiple infiltrations" of what? D/c-ed home on 10d of Augmentin 9/11.      Anginal pain     Anxiety     Arthritis     Asthma     Depression     Diabetes mellitus     Previously on SU and metformin, now diet controlled    Diverticulitis 09/2010    Dysfunctional uterine bleeding     Fibromyalgia     GERD (gastroesophageal reflux disease)     GERD (gastroesophageal reflux disease) 10/04/2016    High blood pressure     Hyperlipidemia     Liver disease     fatty liver     Long term (current) use of insulin, Dermal Adhesed V-Go Insulin Delivery Device 10/04/2016    Migraine     Neuromuscular disorder     POTS (postural orthostatic tachycardia syndrome)     Sebaceous cyst of breast     right axilla    TMJ click, left 0/0/9381    Varicella         Patient Active Problem List   Diagnosis Code    DM (diabetes mellitus), type 2, uncontrolled E11.65    Migraine with aura G43.109    Asthma J45.909    Hyperlipemia E78.5    Fibromyalgia M79.7    Syncope and collapse- recurrent, working diagnosis  vascular vs psychogenic. 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1    Traumatic hemorrhage of right cerebrum Y10.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    Diverticulitis K57.92    Long term (current) use of insulin, Dermal Adhesed V-Go Insulin Delivery Device Z79.4    Morbid obesity with BMI of 40.0-44.9, adult E66.01, Z68.41  Hematuria R31.9    Right hip pain M25.551    Labial abscess N76.4    Low back pain M54.5    Lumbar facet arthropathy M47.816    First degree heart block I44.0            Past Surgical History:   Procedure Laterality Date    APPENDECTOMY  2016    arthroscopic shoulder surgery Right 2010    Related to lifting    CARDIAC CATHETERIZATION  09/2011    negative    COLON SURGERY      COSMETIC SURGERY Bilateral 12/12/2017    B/L internal maxillary arteries embolized due to recurrent epistaxis, Dr Linard Millers    DILATION AND CURETTAGE OF UTERUS  2005, 2007    x2 for menorraghia    LEFT COLECTOMY  01/03/14    Dr Tresa Res    loop recorder  Feb 03 2014    loop recorder removal      PR COLONOSCOPY THRU COLOTOMY N/A 02/16/2016    Procedure: COLONOSCOPY;  Surgeon: Kelly Splinter, MD;  Location: [facility];  Service: GI    PR CYSTOURETHROSCOPY,BIOPSY N/A 10/17/2016    Procedure: CYSTOSCOPY BLADDER ;  Surgeon: Ed Blalock, MD;  Location:  [facility];  Service: Urology    PR EDG TRANSORAL BIOPSY SINGLE/MULTIPLE N/A 10/29/2016    Procedure: EGD;  Surgeon: Kelly Splinter, MD;  Location: [facility];  Service: GI    TONSILLECTOMY       Family History   Problem Relation Age of Onset    Hypertension Father     Diabetes Father     Kidney Disease Father     Hyperlipidemia Father     Heart attack Father 57    Other Father         PVD    Heart Disease Father     Hypertension Mother     Hyperlipidemia Mother     Heart attack Mother 70    Diabetes Mother     Eczema Mother     Psoriasis Mother     Heart Disease Mother     Hypertension Brother     Heart Disease Brother 82        prinzmetal's angina    Heart Disease Sister         currently having work up    [place] Sister     Hypertension Sister     Breast cancer Maternal Grandmother     Stroke Other     Cancer Sister     Thyroid disease Sister     Anesthesia problems Neg Hx           Social History   Substance Use Topics    Smoking status: Former Smoker     Packs/day: 0.50     Years: 3.00     Types: Cigarettes    Smokeless tobacco: Never Used      Comment: quit age late 92s    Alcohol use 0.0 oz/week      Comment: 0-1 drink/ month     Living Situation     Questions Responses    Patient lives with Spouse    Comment: Lives with Wife, Shari     Homeless No    Caregiver for other family member No    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   HENT: Positive for congestion, ear pain and sore throat. Negative for trouble swallowing and voice change.    Eyes: Negative.  Negative for redness and visual disturbance.   Respiratory: Positive for cough and wheezing. Negative for chest tightness and shortness of breath.    Cardiovascular: Negative.  Negative for chest pain.   Gastrointestinal: Negative  for abdominal distention and abdominal pain.   Musculoskeletal: Negative.  Negative for arthralgias and myalgias.   Skin: Negative.    Neurological: Negative.  Negative for speech difficulty, light-headedness and headaches.   Psychiatric/Behavioral: Negative.    All other systems reviewed and are negative.      Physical Exam   Triage Vitals  Triage Start: Start, (01/04/18 1149)   First Recorded BP: 156/82, Temp: 36.9 C (98.4 F), Temp src: TEMPORAL Oxygen Therapy SpO2: 98 %, Oximetry Source: Rt Hand, O2 Device: None (Room air), Heart Rate: 92, (01/04/18 1143)  .  First Pain Reported  0-10 Scale: 7, Pain Location/Orientation: Throat, (01/04/18 1150)       Physical Exam   Constitutional: She is oriented to person, place, and time. Vital signs are normal. She appears well-developed and well-nourished.   HENT:   Head: Normocephalic and atraumatic.   Right Ear: Hearing, tympanic membrane, external ear and ear canal normal.   Left Ear: Hearing, external ear and ear canal normal. Tympanic membrane is injected and retracted. A middle ear effusion is present.   Nose: Nose normal.   Mouth/Throat: Uvula is midline and mucous membranes are normal. Posterior oropharyngeal erythema present.   Eyes: Pupils are equal, round, and reactive to light. Conjunctivae, EOM and lids are normal.   Neck: Trachea normal, normal range of motion, full passive range of motion without pain and phonation normal. Neck supple.   Cardiovascular: Normal rate, regular rhythm, S1 normal, S2 normal, normal heart sounds and intact distal pulses.    Pulmonary/Chest: Effort normal and breath sounds normal.   Harsh nonproductive cough elicited with exam, no wheezing   Abdominal: Soft. Normal appearance. She exhibits no distension and no mass. There is no tenderness. There is no rebound and no guarding. No hernia.   Musculoskeletal: Normal range of motion.   Lymphadenopathy:     She has cervical adenopathy.   Neurological: She is alert and oriented to person,  place, and time.   Skin: Skin is warm, dry and intact. Capillary refill takes less than 2 seconds. No rash noted.   Psychiatric: She has a normal mood and affect. Her speech is normal and behavior is normal. Judgment and thought content normal. Cognition and memory are normal.   Nursing note and vitals reviewed.       Medical Decision Making      Amount and/or Complexity of Data Reviewed  Clinical lab tests: ordered and reviewed        Initial Evaluation:  ED First Provider Contact     Date/Time Event User Comments    01/04/18 1151 ED First Provider Contact Arien Morine Initial Face to Face Provider Contact          Patient was seen on: 01/04/2018        Assessment:  42 y.o.female comes to the [facility] with  left ear pain, sore throat and wheezing over last 4 days, she reports she was exposed directly to strep from a family member.  Has history of asthma and was utilized  for albuterol several times yesterday.  She reports she has been hospitalized to use for her asthma, has never been intubated.  Denies fever or chills, has some fatigue denies abdominal pain or rash.  Reviewed supportive measures with patient.      Differential Diagnosis includes:  Acute viral bronchitis  Community acquired pneumonia  Acute URI NOS  Acute pneumonitis  Pleurisy  Acute otitis externa  Foreign Body  Acute Suporative Otitis Media  Acute Serous Otitis Media  Acute Allergic Otitis Media    Acute Viral Pharyngitis  Acute Allergic Pharyngitis  Acute Bacterial Pharyngitis  Acute Streptococcal Pharyngitis        Plan:   Orders Placed This Encounter    Strep A culture, throat    POCT rapid strep    amoxicillin-clavulanate (AUGMENTIN) 875-125 MG per tablet       Recent Results (from the past 24 hour(s))   POCT rapid strep    Collection Time: 01/04/18 12:03 PM   Result Value Ref Range    Rapid Strep Group A Throat-POC Negative for Streptococcus Group A Antigen Negative    INTERNAL CONTROL RAPID STREP POCT *Yes-internal procedural  control(s) acceptable     Exp date 9/20     Lot # [id number]            Final Diagnosis    ICD-10-CM ICD-9-CM   1. Left otitis media, unspecified otitis media type H66.92 382.9       Encourage fluids, encourage rest, good hand hygiene.    Use over the counter medications as discussed.    Please start the new medications as below:    Current Discharge Medication List        New Medications    Details Last Dose Given Next Dose Due Script Given?   amoxicillin-clavulanate (AUGMENTIN) 1 tablet Dose: 1 tablet  Take 1 tablet by mouth 2 times daily  Quantity 20 tablet, Refill 0  Start date: 01/04/2018, End date: 01/14/2018       Comments: Emergency Encounter                     Please follow up with your physician as below:    Follow-up Information     [name], MD In 1 week.    Specialties:  Primary Care, Family Medicine  Contact information:  [address]  [address]  [place] [address]  [phone]                 Thank you JONI COLEGROVE for coming to [facility] for your health care concerns.    If your condition changes and/or worsens please follow up with her primary doctor and/or return to the urgent care center.    If short of breath, chest pains or any other concerns please report to the emergency room.    In the event of an Emergency dial 911.           Final Diagnosis  Final diagnoses:   [H66.92] Left otitis media, unspecified otitis media type (Primary)         Maebelle Munroe, NP              Maebelle Munroe, NP  01/04/18 1238

## 2018-01-06 LAB — STREP A CULTURE, THROAT: Group A Strep Throat Culture: 0

## 2018-01-14 ENCOUNTER — Ambulatory Visit: Payer: Medicare (Managed Care) | Admitting: Registered Nurse

## 2018-01-16 ENCOUNTER — Other Ambulatory Visit
Admission: RE | Admit: 2018-01-16 | Discharge: 2018-01-16 | Disposition: A | Discharge status: Home / Self Care | Payer: Medicare (Managed Care) | Source: Ambulatory Visit | Attending: Psychiatry | Admitting: Psychiatry

## 2018-01-16 DIAGNOSIS — IMO0002 Reserved for concepts with insufficient information to code with codable children: Secondary | ICD-10-CM

## 2018-01-16 DIAGNOSIS — E1165 Type 2 diabetes mellitus with hyperglycemia: Secondary | ICD-10-CM | POA: Insufficient documentation

## 2018-01-17 LAB — HEMOGLOBIN A1C: Hemoglobin A1C: 7 % — ABNORMAL HIGH

## 2018-01-18 ENCOUNTER — Other Ambulatory Visit: Payer: Self-pay | Admitting: Primary Care

## 2018-01-18 DIAGNOSIS — Z760 Encounter for issue of repeat prescription: Secondary | ICD-10-CM

## 2018-01-19 ENCOUNTER — Encounter: Payer: Self-pay | Admitting: Primary Care

## 2018-01-19 DIAGNOSIS — K589 Irritable bowel syndrome without diarrhea: Secondary | ICD-10-CM

## 2018-01-19 MED ORDER — TRAMADOL HCL 50 MG PO TABS *I*
50.0000 mg | ORAL_TABLET | Freq: Three times a day (TID) | ORAL | 0 refills | Status: DC | PRN
Start: 2018-01-19 — End: 2018-03-25

## 2018-01-19 NOTE — Telephone Encounter (Signed)
Please do istop    [name], MD  [facility]  01/19/2018  8:51 PM

## 2018-01-19 NOTE — Telephone Encounter (Signed)
Last appt: 12/17/2017  Next appt:  Visit date not found    Recent Lab Values 01/04/2018 12/17/2017 10/17/2017 10/17/2017 03/31/2017 10/29/2016 10/17/2016   EXP DATE 9/20 02/02/2018 11-03-18 08-04-18 04/04/18 04/04/2017 08/03/17   [facility] - - - - - - -       [name]   Reference #: [id number]   Others' Prescriptions  Patient Name: Kaylee Harris Birth Date: 1976-02-21   Address: [place], [place] [address] Sex: Female     Rx Written Rx Dispensed Drug Quantity Days Supply Prescriber Name     12/13/2017 12/13/2017 hydrocodone-acetaminophen 5-325 mg tablet  12 2 [place], Lauren, E     11/04/2017 11/12/2017 tramadol hcl 50 mg tablet  42 14 [place], Justice Rocher     08/08/2017 08/08/2017 clonazepam 0.5 mg tablet  28 28 Garry Heater NP     06/25/2017 06/26/2017 lyrica 150 mg capsule  60 30 [name], G (MD)     05/27/2017 05/29/2017 lyrica 150 mg capsule  60 30 [place], Justice Rocher     05/09/2017 05/12/2017 diphenoxylate-atropine 2.5-0.025 mg tablet  240 30 Telford Nab A (Mpas)     04/24/2017 04/24/2017 tramadol hcl 50 mg tablet  42 14 Verita Lamb     04/24/2017 04/24/2017 lyrica 150 mg capsule  60 30 Verita Lamb     04/18/2017 04/23/2017 diazepam 5 mg tablet  2 1 Reece Levy MD     04/04/2017 04/04/2017 oxycodone-acetaminophen 5-325 mg tablet  10 5 Josph Macho MD     04/01/2017 04/01/2017 oxycodone hcl 5 mg tablet  6 [facility]     04/01/2017 04/01/2017 oxycodone hcl 5 mg capsule  6 1 [facility]     03/24/2017 03/24/2017 lyrica 150 mg capsule  60 30 [place], Justice Rocher     02/20/2017 02/21/2017 lyrica 75 mg capsule  60 30 [name], G (MD)     02/18/2017 02/19/2017 clonazepam 0.5 mg tablet  28 28 CoriglianoLattie Haw D NP     01/22/2017 01/22/2017 lyrica 75 mg capsule  60 30 [name], G (MD)     01/21/2017 01/21/2017 tramadol hcl 50 mg tablet  42 14 [place], Nadine

## 2018-01-19 NOTE — Telephone Encounter (Signed)
We have no record of the patient taking this medication. Please advise.

## 2018-01-20 ENCOUNTER — Encounter: Payer: Self-pay | Admitting: Primary Care

## 2018-01-20 MED ORDER — DIPHENOXYLATE-ATROPINE 2.5-0.025 MG PO TABS *I*
1.0000 | ORAL_TABLET | Freq: Four times a day (QID) | ORAL | 0 refills | Status: DC | PRN
Start: 2018-01-20 — End: 2018-11-10

## 2018-01-20 NOTE — Telephone Encounter (Signed)
Last appt: 12/17/2017  Next appt:  Visit date not found    Recent Lab Values 01/04/2018 12/17/2017 10/17/2017 10/17/2017 03/31/2017 10/29/2016 10/17/2016   EXP DATE 9/20 02/02/2018 11-03-18 08-04-18 04/04/18 04/04/2017 08/03/17   TH[facility] - - - - - - -           [name] - 17 Prescriptions  Confidential Drug Report  Search Terms: [name], 11/17/1975   Search Date: 01/20/2018 08:18:45 AM   Searching on behalf of: [id number] - [name]   The Drug Utilization Report below displays all of the controlled substance prescriptions, if any, that your patient has filled in the last twelve months. The information displayed on this report is compiled from pharmacy submissions to the Department, and accurately reflects the information as submitted by the pharmacies.  This report was requested by: Montez Morita   Reference #: [id number]   Others' Prescriptions  Patient Name: Kaylee Harris Birth Date: Jul 22, 1975   Address: [place], [place] [address] Sex: Female   Rx Written Rx Dispensed Drug Quantity Days Supply Prescriber Name Payment Method Dispenser   12/13/2017 12/13/2017 hydrocodone-acetaminophen 5-325 mg tablet  12 2 Haslinger, Lauren, [place]. #02   11/04/2017 11/12/2017 tramadol hcl 50 mg tablet  42 14 [place], [place]. #02   08/08/2017 08/08/2017 clonazepam 0.5 mg tablet  28 28 Corigliano, [name] D NP [facility] #02   06/25/2017 06/26/2017 lyrica 150 mg capsule  60 30 [name], Darnell Level (MD) [place]. #02   05/27/2017 05/29/2017 lyrica 150 mg capsule  48 [place], [place]. #02   05/09/2017 05/12/2017 diphenoxylate-atropine 2.5-0.025 mg tablet  240 30 Telford Nab A ([facility]) [employer] [place]. #02   04/24/2017 04/24/2017 tramadol hcl 50 mg tablet  42 14 [place], [place]. #02   04/24/2017 04/24/2017 lyrica 150 mg capsule   60 30 [place], [place]. #02   04/18/2017 04/23/2017 diazepam 5 mg tablet  2 1 Orsini, [place]. #02   04/04/2017 04/04/2017 oxycodone-acetaminophen 5-325 mg tablet  10 5  [place]arr[place], [place]. #02   04/01/2017 04/01/2017 oxycodone hcl 5 mg tablet  6 1 [place]   04/01/2017 04/01/2017 oxycodone hcl 5 mg capsule  6 1 [place]. #02   03/24/2017 03/24/2017 lyrica 150 mg capsule  37 [place], [place]. #02   02/20/2017 02/21/2017 lyrica 75 mg capsule  60 30 [name], Darnell Level (MD) [place]. #02   02/18/2017 02/19/2017 clonazepam 0.5 mg tablet  28 28 [name] D NP [facility] #02   01/22/2017 01/22/2017 lyrica 75 mg capsule  60 30 [name], Darnell Level (MD) [place]. #02   01/21/2017 01/21/2017 tramadol hcl 50 mg tablet  61 [place], [place]. #02   * - Drugs marked with an asterisk are compound drugs. If the compound drug is made up of more than one controlled substance, then each controlled substance will be a separate row in the table.

## 2018-01-22 ENCOUNTER — Encounter: Payer: Self-pay | Admitting: Psychiatry

## 2018-01-22 ENCOUNTER — Other Ambulatory Visit
Admission: RE | Admit: 2018-01-22 | Discharge: 2018-01-22 | Disposition: A | Discharge status: Home / Self Care | Payer: Medicare (Managed Care) | Source: Ambulatory Visit | Attending: Psychiatry | Admitting: Psychiatry

## 2018-01-22 ENCOUNTER — Ambulatory Visit: Payer: Medicare (Managed Care) | Attending: Psychiatry | Admitting: Psychiatry

## 2018-01-22 VITALS — BP 120/82 | HR 62 | Resp 18 | Ht 67.99 in | Wt 269.0 lb

## 2018-01-22 DIAGNOSIS — E1165 Type 2 diabetes mellitus with hyperglycemia: Secondary | ICD-10-CM

## 2018-01-22 LAB — TSH: TSH: 1.5 u[IU]/mL (ref 0.27–4.20)

## 2018-01-22 LAB — T4, FREE: Free T4: 1.1 ng/dL (ref 0.9–1.7)

## 2018-01-22 MED ORDER — DEXCOM G6 TRANSMITTER MISC *A*
1.0000 | 3 refills | Status: DC | PRN
Start: 2018-01-22 — End: 2018-08-12

## 2018-01-22 MED ORDER — DEXCOM G6 RECEIVER DEVI *A*
1.0000 | 0 refills | Status: DC | PRN
Start: 2018-01-22 — End: 2019-08-24

## 2018-01-22 MED ORDER — DEXCOM G6 SENSOR MISC *A*
1.0000 | 11 refills | Status: DC | PRN
Start: 2018-01-22 — End: 2018-04-08

## 2018-01-22 NOTE — Progress Notes (Signed)
Endocrinology, Diabetes, and Metabolism [facility] for Diabetes.    Consult requested by: [name] MD    HPI:    This is a 42 y.o. female with a history of Type 2 Diabetes for almost 10 years.  Known complications include none.    Since her last visit, she underwent cervical cerebral angiography with embolization of the right and left internal maxillary arteries after posterior epistaxis.    With respect to her diabetes she has been doing very well. She has improved her Diabetes control significantly on her OmniPod pump. She is trying to get approved for the dexcom.    Denies any CP or SOB, no polyuria, no polydipsia. Occasional double vision when she feels a migraine is coming on. No blurry vision. No severe hypoglycemia requiring assistance or EMS. No paresthesias in hands or feet. No open wounds or sores on feet and performs daily self checks.Has ongoing diabetes health maintenance activities done including routine eye exams, podiatry and dental.    She has been feeling hot lately. Her hair is falling out in clumps and has been having diarrhea for some time now. She has lost 9# since November 03 2017. She has had some intermittent palpitations. Co occasional difficulty swallowing. She feels tired all the time. Her sister has a history of thyroid cancer, she is unsure what type.      Current diabetes regimen:   trulicity 2.7XA weekly    OMNIPOD  Basal Rates:  MN 0.95 units/hr  3AM 1.45 units/hr  6AM 1.2 units/hr  10PM 0.95 units/hr  ICR 8  ISF 40  Target 140  Active Insulin 4h  49% basal /51% bolus    Blood sugar logs and/or meter were brought to the visit for review.  Reviewed on [facility]  8/21-9/19/2019  AVG BGs 133  Lowest BG 68  Fasting 100s-160s  Lunch 116-170s  Dinner 100s-190s  Bedtime 85-140s    Checking 4-6x daily. Complains of sore fingers is interested in CGM.    hypoglycemia occurs 0 times a week.  It is most often at never.  There has been the need for medical assistance for these episodes none.      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 Lower abdomen with cellulitis changes, wound probed and purulent material expressed.  Had PICC line for "multiple infiltrations" of what? D/c-ed home on 10d of Augmentin 9/11.      Anginal pain     Anxiety     Arthritis     Asthma     Depression     Diabetes mellitus     Previously on SU and metformin, now diet controlled    Diverticulitis 09/2010    Dysfunctional uterine bleeding     Fibromyalgia     GERD (gastroesophageal reflux disease)     GERD (gastroesophageal reflux disease) 10/04/2016    High blood pressure     Hyperlipidemia     Liver disease     fatty liver     Long term (current) use of insulin, Dermal Adhesed V-Go Insulin Delivery Device 10/04/2016    Migraine     Neuromuscular disorder  POTS (postural orthostatic tachycardia syndrome)     Sebaceous cyst of breast     right axilla    TMJ click, left 1/0/2725    Varicella      Past Surgical History:   Procedure Laterality Date    APPENDECTOMY  2016    arthroscopic shoulder surgery Right 2010    Related to lifting    CARDIAC CATHETERIZATION  09/2011    negative    COLON SURGERY      COSMETIC SURGERY Bilateral 12/12/2017    B/L internal maxillary arteries embolized due to recurrent epistaxis, Dr Linard Millers    DILATION AND CURETTAGE OF UTERUS  2005, 2007    x2 for menorraghia    LEFT COLECTOMY  01/03/14    Dr Tresa Res    loop recorder  Feb 03 2014    loop recorder removal      PR COLONOSCOPY THRU COLOTOMY N/A 02/16/2016    Procedure: COLONOSCOPY;  Surgeon: Kelly Splinter, MD;  Location: [facility];  Service: GI    PR CYSTOURETHROSCOPY,BIOPSY N/A 10/17/2016    Procedure: CYSTOSCOPY BLADDER  ;  Surgeon: Ed Blalock, MD;  Location: [facility];  Service: Urology    PR EDG TRANSORAL BIOPSY SINGLE/MULTIPLE N/A 10/29/2016    Procedure: EGD;  Surgeon: Kelly Splinter, MD;  Location: [facility];  Service: GI    TONSILLECTOMY       Family History   Problem Relation Age of Onset    Hypertension Father     Diabetes Father     Kidney Disease Father     Hyperlipidemia Father     Heart attack Father 64    Other Father         PVD    Heart Disease Father     Hypertension Mother     Hyperlipidemia Mother     Heart attack Mother 85    Diabetes Mother     Eczema Mother     Psoriasis Mother     Heart Disease Mother     Hypertension Brother     Heart Disease Brother 69        prinzmetal's angina    Heart Disease Sister         currently having work up    [place] Sister     Hypertension Sister     Breast cancer Maternal Grandmother     Stroke Other     Cancer Sister     Thyroid disease Sister     Anesthesia problems Neg Hx      Social History     Social History    Marital status: Married     Spouse name: N/A    Number of children: N/A    Years of education: N/A     Social History Main Topics    Smoking status: Former Smoker     Packs/day: 0.50     Years: 3.00     Types: Cigarettes    Smokeless tobacco: Never Used      Comment: quit age late 24s    Alcohol use 0.0 oz/week      Comment: 0-1 drink/ month    Drug use: No    Sexual activity: Yes     Partners: Female     Patent examiner protection: IUD     Other Topics Concern    None     Social History Narrative    ** Merged History Encounter **  Married to [employer], recently relocated back to [place] from [place] due to family stressors. 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        Allergies:   Allergies   Allergen Reactions    Morphine Itching    Trazodone Anaphylaxis    Seasonal Allergies Itching    Nsaids Other (See Comments)     Bleeding/epistaxis    Other [Other]  Other (See Comments)     Derma Bond(skin glue) pruritis/redness and c/o respiratory distress.   Received name: Other       Current Outpatient Prescriptions on File Prior to Visit   Medication Sig Dispense Refill    diphenoxylate-atropine (LOMOTIL) 2.5-0.025 MG per tablet Take 1-2 tablets by mouth 4 times daily as needed for Diarrhea   Max daily dose: 8 tablets Code D 240 tablet 0    traMADol (ULTRAM) 50 MG tablet Take 1 tablet (50 mg total) by mouth every 8 hours as needed (for head pain and abd pain)   Max daily dose: 150 mg 42 tablet 0    lamoTRIgine (LAMICTAL) 150 MG tablet Take 1 tablet (150 mg total) by mouth daily 90 tablet 0    cetirizine (ZYRTEC) 10 MG tablet Take 1 tablet (10 mg total) by mouth daily 90 tablet 1    busPIRone (BUSPAR) 30 MG tablet Take 1 tablet (30 mg total) by mouth 2 times daily 60 tablet 5    famciclovir (FAMVIR) 500 MG tablet TAKE 1 TABLET BY MOUTH THREE TIMES DAILY AS NEEDED 30 tablet 5    Alcohol Swabs (ALCOHOL PREP) PADS Use BID for BG checkUse BID for BG check 100 each 1    Insulin Disposable Pump (OMNIPOD DASH 5 PACK) MISC By 1 Device no specified route as needed 9 each 3    blood glucose test strip Test  8 times a day.   Brand name of strips Contour NEXT test strips for linked omnipod pump 250 strip 11    lancets (BAYER MICROLET) Use   8  times per day as instructed for blood glucose testing. 250 each 11    topiramate (TOPAMAX) 100 MG tablet Take 1 tablet (100 mg total) by mouth 2 times daily 60 tablet 5    pantoprazole (PROTONIX) 40 MG EC tablet Take 1 tablet (40 mg total) by mouth daily   SWALLOW WHOLE. DO NOT CRUSH, BREAK, OR CHEW. 30 tablet 5    DULoxetine (CYMBALTA) 30 MG DR capsule Take 1 capsule (30 mg total) by mouth 2 times daily 180 capsule 3    mirtazapine (REMERON) 15 MG tablet Take 1 tablet (15 mg total) by mouth nightly 90 tablet 3    azelastine (OPTIVAR) 0.05 % ophthalmic solution Place 1 drop into both eyes 2 times daily      SUMAtriptan refill  (IMITREX STATDOSE) 6 MG/0.5ML injection INJECT 0.5ML UNDER THE SKIN ONCE AS NEEDED FOR MIGRAINE. MAY REPEAT DOSE ONCE AFTER 1 HOUR 2 Syringe 5    dulaglutide (TRULICITY) 1.5 OE/7.8SX injection pen Inject 0.5 mLs (1.5 mg total) into the skin every 7 days 6 mL 3    glucagon (GLUCAGEN) 1 MG injection kit Inject 1 mg into the skin once as needed for Low blood sugar   Discard any unused portion. 1 each 5    clonazePAM (KLONOPIN) 0.5 MG tablet Take 1 tablet (0.5 mg total) by mouth daily as needed   Max daily dose: 0.5 mg 28 tablet 0    promethazine (PHENERGAN)  25 MG tablet Take 1 tablet (25 mg total) by mouth every 4-6 hours as needed for Nausea 30 tablet 3    insulin lispro 100 UNIT/ML injection vial Inject as directed up to MDD 100 units 40 mL 11    SUMAtriptan (IMITREX) 50 MG tablet TAKE 1 TABLET BY MOUTH AS NEEDED FOR MIGRAINE, TAKE AT ONSET OF HEADACHE. MAY REPEAT ONCE IN 2 HOURS 9 tablet 5    fluticasone (FLONASE) 50 MCG/ACT nasal spray 1 spray by Nasal route daily 48 g 3    SUMAVEL DOSEPRO 6 MG/0.5ML needle-free injection INJECT 0.5MLS (6MG TOTAL) UNDER THE SKIN AS NEEDED FOR MIGRAINE MAY REPEAT ONCE AFTER 1 HOUR IF NEEDED  1    atorvastatin (LIPITOR) 40 MG tablet Take 1 tablet (40 mg total) by mouth daily (with dinner) 90 tablet 3    albuterol HFA 108 (90 Base) MCG/ACT inhaler Inhale 1-2 puffs into the lungs every 6 hours as needed for Wheezing   Shake well before each use. 1 Inhaler 5    metoprolol (TOPROL-XL) 25 MG 24 hr tablet Take by mouth daily         ascorbic acid (VITAMIN C) 100 MG tablet Take 100 mg by mouth daily      midodrine (PROAMATINE) 10 MG tablet Take 1 tablet (10 mg total) by mouth 3 times daily 90 tablet 0    insulin syringe-needle U-100 (BD ULTRAFINE) 31G X 5/16" 0.3 ML HALF-UNIT Use 3 times a day as instructed. 100 each 0    Non-System Medication The above patient is followed in our clinic and cannot resume work permanently. 1 each 1     No current facility-administered  medications on file prior to visit.      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 no  -Dawn phenomenon:  yes  -HbA1c greater than 7% or 1% over upper range of normal: yes  -History of suboptimal glycemic control before or during pregnancy: na  -LAST 30 DAYS of BG LOGS (in notes below) or ranges from: [facility] report 68-190s    Review of Systems:  CONSTITUTIONAL:  Appetite good, no fevers, night sweats, +13# weight loss since 06/2017  HEAD: No headache, dizziness or syncope  EYES:No vision changes or eye pain, intermittent blurry vision with migraine  ENT: No hearing difficulties or ear pain  CV: No chest pain, shortness of breath or edema.  RESPIRATORY:  No SOB, cough or wheezing  GI:  No nausea, vomiting, abdominal pain or change in bowel habits  GU:  No dysuria, urgency or incontinence  NEURO:  No mental status changes, motor weakness or sensory changes  PSYCH:  + depression and anxiety, stable  MS:  No joint pain, swelling or musculoskeletal deformities  SKIN:  No rashes  HEME/LYMPH:  No easy bleeding, bruising or swollen nodes  ENDOCRINE:  No polyuria, polydipsia, polyphagia or heat/cold intolerance    Physical Examination:  Vitals:    01/22/18 1429   BP: 120/82   Pulse: 62   Resp: 18   Weight: 122 kg (269 lb)   Height: 1.727 m (5' 7.99")     Body mass index is 40.91 kg/m.  Wt Readings from Last 3 Encounters:   01/22/18 122 kg (269 lb)   12/24/17 120.7 kg (266 lb 3.2 oz)   12/17/17 118.8 kg (262 lb)       CONSTITUTIONAL:  well  developed, well nourished, no acute distress  HEENT: EOMI, no lid lag, pink conjunctiva, no proptosis.   NECK: supple, no thyromegaly, no lymphadenopathy, no acanthosis nigricans  HEART/VASCULAR: RRR, normal S1, S2, no MRG, no JVD or carotid bruit, no LE edema, clubbing, or cyanosis.  Pedal pulses 2+ bilaterally.  CHEST: lungs clear to auscultation bilaterally posteriorly  ABDOMEN: soft, nontender, nondistended  EXTREMITIES:  joints without deformity or synovitis. Foot exam deferred.  NEUROLOGICAL: alert and oriented x 3. Full power bilateral upper and lower extremities. bil fine tremor noted.  SKIN:  No rashes. No lipohypertrophy.  LYMPH: no palpable lymphadenopathy.  ENDOCRINE: No supraclavicular fat pads, facial plethora, moon facies, abdominal striae, neck full on palpation, no obvious nodules  PSYCH:mood bright and affect appropriate      Labs and Imaging:    I personally reviewed and confirmed all laboratory and radiology testing listed below:      Lab results: 12/14/17  1527   Sodium 142   Potassium 3.7   Chloride 108   CO2 24   UN 15   Creatinine 0.9   GFR,Caucasian >60   GFR,Black >60   Glucose 120*   Calcium 9.3         Lab Results   Component Value Date    HA1C 7.0 (H) 01/16/2018       Lab Results   Component Value Date    ALT 17 10/16/2017    AST 14 10/16/2017     Lab Results   Component Value Date    CHOL 160 05/13/2017    HDL 34 05/13/2017    LDLC 79 05/13/2017    TRIG 234 (!) 05/13/2017    [place] 4.7 05/13/2017     Lab Results   Component Value Date    TSH 1.31 07/10/2015       Lab Results   Component Value Date    WBC 16.5 (H) 12/14/2017    HGB 13.3 12/14/2017    HCT 42 12/14/2017    MCV 88 12/14/2017    PLT 372 12/14/2017       Assessment: This is a 42 y.o. female with Type 2 Diabetes Uncontrolled with hyperglycemia.  Goal A1c is <7%. She has made substantial improvements in her blood sugar control since pump therapy. CGM would be beneficial to help her monitor trends and patterns and help avoid  potential hypoglycemia.Check Tfts.      Plan:   Continue current pump settings  Continue Trulicity for now and will investigate family history of thyroid cancer and consider discontinuing  BP is at goal on current therapy.  On statin therapy.  Reorder dexcom through [employer] annual dilated eye exams and biannual dental exams for routine health maintenance.  hba1c before next appointment  TFTs today    Follow up: 3 month(s) or sooner if needed.    >45 minutes spent with pt and >50% of visit in counseling and coordination of care as above reviewing dexcom G6 approval and benefits of wearing sensor in  overall glucose control.    Nada Libman, NP    Nada Libman, NP  [facility] Department of Endocrinology  [address] [place], [place] [address]  Phone [phone]  Fax [phone]

## 2018-01-23 ENCOUNTER — Other Ambulatory Visit: Payer: Self-pay | Admitting: Psychiatry

## 2018-01-23 DIAGNOSIS — E119 Type 2 diabetes mellitus without complications: Secondary | ICD-10-CM

## 2018-01-26 ENCOUNTER — Encounter: Payer: Self-pay | Admitting: Registered Nurse

## 2018-01-27 ENCOUNTER — Other Ambulatory Visit: Payer: Self-pay | Admitting: Primary Care

## 2018-01-27 DIAGNOSIS — F603 Borderline personality disorder: Secondary | ICD-10-CM

## 2018-01-27 DIAGNOSIS — F319 Bipolar disorder, unspecified: Secondary | ICD-10-CM

## 2018-01-28 MED ORDER — LAMOTRIGINE 150 MG PO TABS *I'
150.0000 mg | ORAL_TABLET | Freq: Every day | ORAL | 0 refills | Status: DC
Start: 2018-01-28 — End: 2018-07-06

## 2018-01-28 NOTE — Telephone Encounter (Signed)
Last appt: 12/17/2017  Next appt:  Visit date not found    Recent Lab Values 01/04/2018 12/17/2017 10/17/2017 10/17/2017 03/31/2017 10/29/2016 10/17/2016   EXP DATE 9/20 02/02/2018 11-03-18 08-04-18 04/04/18 04/04/2017 08/03/17   [facility] - - - - - - -

## 2018-02-02 ENCOUNTER — Other Ambulatory Visit: Payer: Self-pay | Admitting: Primary Care

## 2018-02-02 DIAGNOSIS — IMO0002 Reserved for concepts with insufficient information to code with codable children: Secondary | ICD-10-CM

## 2018-02-02 NOTE — Telephone Encounter (Signed)
Already filled

## 2018-02-02 NOTE — Telephone Encounter (Signed)
Last appt: 12/17/2017  Next appt:  Visit date not found    Recent Lab Values 01/04/2018 12/17/2017 10/17/2017 10/17/2017 03/31/2017 10/29/2016 10/17/2016   EXP DATE 9/20 02/02/2018 11-03-18 08-04-18 04/04/18 04/04/2017 08/03/17   [facility] - - - - - - -

## 2018-02-02 NOTE — Telephone Encounter (Signed)
Last appt: 12/17/2017  Next appt:  02/02/2018    Recent Lab Values 01/04/2018 12/17/2017 10/17/2017 10/17/2017 03/31/2017 10/29/2016 10/17/2016   EXP DATE 9/20 02/02/2018 11-03-18 08-04-18 04/04/18 04/04/2017 08/03/17   [facility]CU - - - - - - -

## 2018-02-03 MED ORDER — INSULIN SYRINGE-NEEDLE U-100 31G X 5/16" 0.3 ML MISC *A*
0 refills | Status: DC
Start: 2018-02-03 — End: 2018-02-20

## 2018-02-04 ENCOUNTER — Ambulatory Visit: Payer: Medicare (Managed Care) | Attending: Endocrinology | Admitting: Registered Nurse

## 2018-02-04 DIAGNOSIS — E1165 Type 2 diabetes mellitus with hyperglycemia: Secondary | ICD-10-CM | POA: Insufficient documentation

## 2018-02-04 DIAGNOSIS — IMO0002 Reserved for concepts with insufficient information to code with codable children: Secondary | ICD-10-CM

## 2018-02-04 NOTE — Progress Notes (Signed)
Kaylee Harris 42 y.o. female was seen today for diabetes education regarding  uncontrolled type 2 diabetes mellitus. Last seen 12/10/2017 for DSMT; now Omnipod DASH pump + DExcom G6 CGM. Post pump + CGM fu today.     Diabetes Meds: reviewed in eRecord; patient is taking as prescribed. Trulicity 1.5 mg q week;Humalog vial      Lab Results   Component Value Date    HA1C 7.0 (H) 01/16/2018    HA1C 7.2 (H) 10/20/2017    HA1C 9.4 (H) 05/13/2017    MALBR 0.43 04/23/2017    CREAT 0.9 12/14/2017    LDLC 79 05/13/2017     Self glucose monitoring:    BG meter 8 times daily. Last 14  Days average 136;  Lowest 72 - 200.   fbs today 120  BG today:  114 mg/dl    IOB 0.80 units    -- Current management: Omnipod DASH Insulin Pump  -- Insulin pump settings:  -- Basal rates:  12 AM - 0.95 units/hour  3 AM - 1.45 units/hour  6 AM - 1.20 units/hour  10 PM - 0.95 units/hour  --TDD basal insulin: 28.2 units   TDD 52;    Basal 53%; bolus 47%  -- Active insulin time: 4 hours  -- Bolus wizard use: yes  -- Insulin:carb ratio: 1:8 grams  -- Sensitivity: 40 mg/dL  -- Target blood glucose: 140 mg/dL  -- Glucose sensor: G6  Low 70; high 250      Diet: 1 meals per day ; wt loss per pt; lack of appetite; boluses for coffee (15 gms)  Current Activity  As tolerated  Smoking: no Alcohol: no rare     Self foot check:  self care  Eye Exam inquiry: done    Assessment:   Describes proper skill with insert PODs for pump   Describes proper skill with insert Dexcom G6 CGM  Met goal to enter all bGS from dexcom  Goal to confirm by bG low or high alerts on dexcom   Now linked to Clarity   Goal for 20% temp basal reduction with activity  MET goal for new soft sites for sites  Partner present & both demonstrate proper understanding of basal / bolus system  omnipod pump emergency back up plan established    Coun/Edu:     BARRIERS THAT AFFECT LEARNING:  Mental health ; followed by psych  READINESS TO LEARN:  good  PREFERRED METHODS OF LEARNING:  reading,  demonstration and visual tools  EDUCATION PROVIDED: insulin teaching, BG Goals, using ICR and CF to determine meal bolus and future CGM; risk reduction; skin care with PODS; post pump FU  & personal CGM    Plan:     Patient Instructions   Emergency Back Up Plan for Pump Failure  Insulin Pump company contact phone numbers & request replacement pump:  Medtronic: [phone]     Omnipod: [phone]    Tandem: [phone]    Do you have a copy of your most recent pump settings? yes  Do you have long acting insulin (Lantus, Basaglar,  Levemir, [place]ola Corn) NO  Need any Pen needles Or syringe? YES    Option 1   :  Total Basal insulin amount  for 24 hrs:  28.40 units   Add 20%: 32  take this dose NOW & note time  Use your rapid acting insulin (Humalog, Novolog, Admelog, Apidra) for meal bolus & correction doses based on your insulin to carb ratio &  sensitivity & target blood sugar .  Wait 4 hrs between each injection bolus  Example: insulin to carb  ratio is 15 GMs;  Sensitivity is 50: target blood sugar 100 - 120   Take blood sugar - target (120) & divide that result by 50 for correction dose   Take carb GMs you plan to eat & divide by 15 GMs    Add together for your insulin bolus dose    Once your new pump arrives:    Restart pump 2 hours from last long acting insulin dose*  OR you can start pump immediately & use a temporary basal rate 0% until 2 hours from your long acting insulin dose from the day before.  Example:  Your pump stopped working at 2pm on Tuesday, you take long acting insulin at 3pm.  Pump arrives 8 am Wednesday.    Option to  WAIT & restart resume  pump use at 1pm Wednesday (2 hrs from long acting dose)   OR start pump immediately ( 8 am)  & use temporary basal 0% until 1 pm ( 2 hr from long acting dose taken at 3pm)     Option#2:  (if no long acting insulin is available)  Use your rapid acting insulin vial (with syringe) or pen every 4 hrs  Calculate your food bolus amount based on your  insulin to carb ratio:  8 GMs  Calculate your sensitivity (correction dose) based on your pump sensitivity: 40  Using your target blood sugar:  140  Once your pump arrives  You can set up your pump & start its use immediately    Medtronic 670G Pump Users must be in manual mode for 6 days & then enter auto mode    On Call [facility] Doctor is available after 5pm & weekends    [phone]  OR you can contact your diabetes educator during normal business hours.  All pump companies offer customer support 24 hours x 7 days/week to assist you      2  Use temp basal 20% reduction with activity (cleaning house)       DSMT SUPPORT PLAN:  Take advantage of your annual Medicare benefits for DSMT and MNT  Follow-up visit:  06/2018 RN CDE   Time spent counseling patient: 30 minutes    MCR insurance MD follow up referral is in eRecord    Allowed 2 hrs Individual/group DSMT 0 hrs remain; expires 05/05/2018    Valaria Good, RN CDE

## 2018-02-04 NOTE — Patient Instructions (Signed)
Emergency Back Up Plan for Pump Failure  Insulin Pump company contact phone numbers & request replacement pump:  [employer]: [phone]     O[employer]od: [phone]    T[employer]: [phone]    Do you have a copy of your most recent pump settings? yes  Do you have long acting insulin (Lantus, Basaglar,  Levemir, Creola Corn) NO  Need any Pen needles Or syringe? YES    Option 1   :  Total Basal insulin amount  for 24 hrs:  28.40 units   Add 20%: 32  take this dose NOW & note time  Use your rapid acting insulin (Humalog, Novolog, Admelog, Apidra) for meal bolus & correction doses based on your insulin to carb ratio & sensitivity & target blood sugar .  Wait 4 hrs between each injection bolus  Example: insulin to carb  ratio is 15 GMs;  Sensitivity is 50: target blood sugar 100 - 120   Take blood sugar - target (120) & divide that result by 50 for correction dose   Take carb GMs you plan to eat & divide by 15 GMs    Add together for your insulin bolus dose    Once your new pump arrives:    Restart pump 2 hours from last long acting insulin dose*  OR you can start pump immediately & use a temporary basal rate 0% until 2 hours from your long acting insulin dose from the day before.  Example:  Your pump stopped working at 2pm on Tuesday, you take long acting insulin at 3pm.  Pump arrives 8 am Wednesday.    Option to  WAIT & restart resume  pump use at 1pm Wednesday (2 hrs from long acting dose)   OR start pump immediately ( 8 am)  & use temporary basal 0% until 1 pm ( 2 hr from long acting dose taken at 3pm)     Option#2:  (if no long acting insulin is available)  Use your rapid acting insulin vial (with syringe) or pen every 4 hrs  Calculate your food bolus amount based on your insulin to carb ratio:  8 GMs  Calculate your sensitivity (correction dose) based on your pump sensitivity: 40  Using your target blood sugar:  140  Once your pump arrives  You can set up your pump & start its use immediately    [employer]  670G Pump Users must be in manual mode for 6 days & then enter auto mode    On Call [facility] is available after 5pm & weekends    [phone]  OR you can contact your diabetes educator during normal business hours.  All pump companies offer customer support 24 hours x 7 days/week to assist you      2  Use temp basal 20% reduction with activity (cleaning house)

## 2018-02-05 ENCOUNTER — Telehealth: Payer: Self-pay

## 2018-02-05 ENCOUNTER — Ambulatory Visit
Admission: RE | Admit: 2018-02-05 | Discharge: 2018-02-05 | Disposition: A | Discharge status: Home / Self Care | Payer: Medicare (Managed Care) | Source: Ambulatory Visit

## 2018-02-05 DIAGNOSIS — R590 Localized enlarged lymph nodes: Secondary | ICD-10-CM

## 2018-02-05 DIAGNOSIS — E119 Type 2 diabetes mellitus without complications: Secondary | ICD-10-CM

## 2018-02-05 MED ORDER — CEPHALEXIN 500 MG PO CAPS *I*
500.0000 mg | ORAL_CAPSULE | Freq: Three times a day (TID) | ORAL | 0 refills | Status: DC
Start: 2018-02-05 — End: 2018-02-05

## 2018-02-05 MED ORDER — CEPHALEXIN 500 MG PO CAPS *I*
500.0000 mg | ORAL_CAPSULE | Freq: Three times a day (TID) | ORAL | 0 refills | Status: AC
Start: 2018-02-05 — End: 2018-02-15

## 2018-02-05 NOTE — Telephone Encounter (Signed)
Patient called stating that her right great toe is infected and red. She touched it and there was some yellowish/ white discharge that came out. Please advise if you want to see the pt or send in abx.

## 2018-02-05 NOTE — Addendum Note (Signed)
Addended byPerfecto Kingdom on: 02/05/2018 04:03 PM     Modules accepted: Orders

## 2018-02-06 ENCOUNTER — Encounter: Payer: Self-pay | Admitting: Podiatry

## 2018-02-06 ENCOUNTER — Encounter: Payer: Self-pay | Admitting: Endocrinology

## 2018-02-06 ENCOUNTER — Ambulatory Visit: Payer: Medicare (Managed Care) | Attending: Podiatry | Admitting: Podiatry

## 2018-02-06 VITALS — Ht 67.99 in | Wt 269.0 lb

## 2018-02-06 DIAGNOSIS — L03031 Cellulitis of right toe: Secondary | ICD-10-CM

## 2018-02-06 LAB — HM DIABETES FOOT EXAM

## 2018-02-06 NOTE — Progress Notes (Signed)
Chief Complaint   Patient presents with    Right Foot - Follow-up, Pain     Subjective:       Patient presents to clinic complaining of pain to the right first toe.  She states that approximately 1 week ago the toe became red and swollen.  She states that she was able to express pus from the medial nail border.  She is currently taking cephalexin that was prescribed to her yesterday.  She has been bandaging the toe with antibiotic ointment and a Band-Aid.      Patient ID: Kaylee Harris is a 42 y.o. female.  Patient Active Problem List   Diagnosis Code    DM (diabetes mellitus), type 2, uncontrolled E11.65    Migraine with aura G43.109    Asthma J45.909    Hyperlipemia E78.5    Fibromyalgia M79.7    Syncope and collapse- recurrent, working diagnosis vascular vs psychogenic. 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1    Traumatic hemorrhage of right cerebrum S06.349A    Diverticulitis K57.92    Long term (current) use of insulin, Dermal Adhesed V-Go Insulin Delivery Device Z79.4    Morbid obesity with BMI of 40.0-44.9, adult E66.01, Z68.41    Hematuria R31.9    Right hip pain M25.551    Labial abscess N76.4    Low back pain M54.5    Lumbar facet arthropathy M47.816    First degree heart block I44.0     Current Outpatient Prescriptions    Medication    cephalexin (KEFLEX) 500 MG capsule    insulin syringe-needle U-100 (BD ULTRAFINE) 31G X 5/16" 0.3 ML HALF-UNIT    VENTOLIN HFA 108 (90 Base) MCG/ACT inhaler    lamoTRIgine (LAMICTAL) 150 MG tablet    Continuous Blood Gluc Receiver (DEXCOM G6 RECEIVER) DEVI    Continuous Blood Gluc Sensor (DEXCOM G6 SENSOR) MISC    Continuous Blood Gluc Transmit (DEXCOM G6 TRANSMITTER) MISC    diphenoxylate-atropine (LOMOTIL) 2.5-0.025 MG per tablet    traMADol (ULTRAM) 50 MG tablet    cetirizine (ZYRTEC) 10 MG tablet    busPIRone (BUSPAR) 30 MG tablet    famciclovir (FAMVIR) 500 MG tablet    Alcohol Swabs (ALCOHOL PREP) PADS    Insulin Disposable Pump (OMNIPOD DASH 5 PACK) MISC    blood glucose test strip    lancets (BAYER MICROLET)    topiramate (TOPAMAX) 100 MG tablet    pantoprazole (PROTONIX) 40 MG EC tablet    DULoxetine (CYMBALTA) 30 MG DR capsule    mirtazapine (REMERON) 15 MG tablet    azelastine (OPTIVAR) 0.05 % ophthalmic solution    SUMAtriptan refill (IMITREX STATDOSE) 6 MG/0.5ML injection    dulaglutide (TRULICITY) 1.5 OZ/3.6UY injection pen  glucagon (GLUCAGEN) 1 MG injection kit    clonazePAM (KLONOPIN) 0.5 MG tablet    promethazine (PHENERGAN) 25 MG tablet    insulin lispro 100 UNIT/ML injection vial    SUMAtriptan (IMITREX) 50 MG tablet    fluticasone (FLONASE) 50 MCG/ACT nasal spray    SUMAVEL DOSEPRO 6 MG/0.5ML needle-free injection    atorvastatin (LIPITOR) 40 MG tablet    metoprolol (TOPROL-XL) 25 MG 24 hr tablet    ascorbic acid (VITAMIN C) 100 MG tablet    midodrine (PROAMATINE) 10 MG tablet    Non-System Medication     No current facility-administered medications for this visit.      Allergy History as of 07/09/17     TRAZODONE       Noted Status Severity Type Reaction    01/08/13 1545 Morris, Arrie Aran 01/08/13 Active High Allergy Anaphylaxis          MORPHINE       Noted Status Severity Type Reaction    03/28/17 2239 Frankey Poot, MD 01/08/13 Active  High Allergy Itching    01/08/13 1545 Morris, Arrie Aran 01/08/13 Active High Allergy Anaphylaxis          TRAMADOL       Noted Status Severity Type Reaction    07/06/14 1457 Beatriz Chancellor, RN 07/06/14 Deleted High Allergy Anaphylaxis    07/06/14 1457 Beatriz Chancellor, RN 07/06/14 Active High Allergy Anaphylaxis          KETOROLAC TROMETHAMINE       Noted Status Severity Type Reaction    02/23/17 1425 Dorathy Kinsman, MD 07/06/14 Deleted High Allergy Anaphylaxis    Comments:  Notes feeling tightness in her throat and full body itching after last dose a month ago. Also notes erythema tracking along her vein after injection. This is not reflected by documentation.  Tolerates Naproxen.     07/20/14 1845 Conception Oms, PharmD 07/06/14 Active High Allergy Anaphylaxis    Comments:  Notes feeling tightness in her throat and full body itching after last dose a month ago. Also notes erythema tracking along her vein after injection. This is not reflected by documentation.  Tolerates Naproxen.     07/06/14 [place], MD 07/06/14 Active High Allergy Anaphylaxis    Comments:  Notes feeling tightness in her throat and full body itching after last dose a month ago. Also notes erythema tracking along her vein after injection. This is not reflected by documentation.     07/06/14 1458 Beatriz Chancellor, RN 07/06/14 Active High Allergy Anaphylaxis          KETOROLAC TROMETHAMINE       Noted Status Severity Type Reaction    10/04/16 1448 Docia Chuck, PA 10/01/14 Deleted Low Allergy Other (See Comments)    Comments:  unknown     10/01/14 1837 Mosko, [place], RN 10/01/14 Active Low Allergy Other (See Comments)    Comments:  unknown           TRAZODONE       Noted Status Severity Type Reaction    10/04/16 1447 Docia Chuck, [place] 10/01/14 Deleted Low Allergy Other (See Comments)    Comments:  unknown     10/01/14 1837 Mosko, [place], RN 10/01/14 Active Low Allergy Other (See Comments)    Comments:  unknown           MORPHINE        Noted Status Severity Type Reaction    10/04/16 1447 Docia Chuck, [place] 10/01/14 Deleted  Low Allergy Other (See Comments)    Comments:  unknown     10/01/14 1837 Mosko, [place], RN 10/01/14 Active Low Allergy Other (See Comments)    Comments:  unknown           Other [Other]       Noted Status Severity Type Reaction    10/04/16 1449 Docia Chuck, PA 10/31/15 Active Low  Other (See Comments)    Comments:  Derma Bond(skin glue) pruritis/redness and c/o respiratory distress.   Received name: Other           SEASONAL ALLERGIES       Noted Status Severity Type Reaction    10/04/16 1449 Docia Chuck, PA 10/04/16 Active  Allergy Itching                right foot pain   What is your goal for today's visit?:  Check out my big toe it has yellow / white puss coming out. Has a black spot down in the corner of the bail.   Date of onset:  02/03/2018  Was this the result of an injury?: No    Pain level:  7/10  Pain quality:  Radiating, tenderness and throbbing  Diagnostic workup:  No prior workup  Treatments tried:  Acetominophen  Progression since onset:  Rapidly worsening  Work related condition?: No    Work status:  No work    Patient's medications, allergies, past medical, surgical, social and family histories were reviewed and updated as appropriate.    Review of Systems   Psychiatric/Behavioral: Agitation:   .foll.   Answers for HPI/ROS submitted by the patient on 02/05/2018   Right foot pain  Tingling: Yes          Objective:   Physical Exam    Vascular:  Dorsalis pedis pulses are 2+ (normal)/4 bilateral and posterior tibial pulses are 2+ (normal)/4 bilateral.  Capillary refill time is less than 3 seconds to all toes.  Skin temperature is warm to warm from proximal to distal.  Pedal hair growth is present. Erythema and edema noted to the medial and proximal border of the right first toe.     Musculoskeletal:   Pain with palpation of the proximal medial border of the right first toe.    Dermatological:  Toe nails 1 through 5  bilateral are thickened, dystrophic, and mycotic with subungual debris.  Web spaces 1 through 4 bilateral are dry and intact. No open lesions or excoriations are noted.  Purulent drainage and cellulitic streaking noted to the medial border of the right first toenail.    Neurological:  Vibratory sensation is intact to the hallux IPJ bilaterally.  Protective sensation is intact to 10 out of 10 sites bilateral.        Assessment:         1. Cellulitis of great toe of right foot         Plan:      1.  Patient was examined and evaluated for cellulitis of the right hallux due to a purulent abscess of the proximal nail border.  She was verbally consented for drainage of the nail border.  Patient was advised to complete her course of cephalexin and continue to soak her feet in warm water and Epsom salts or antibacterial soap.  She may continue to dress the toe with antibacterial ointment and a bandage.    Procedure:    1.  A time out was performed.  The right first toe was  cleaned and prepped with isopropyl alcohol and ethyl chloride.  The toe was anesthetized with approximately 3 mL of 2% lidocaine plain.  A Freer elevator was used to clean any debris from the proximal medial border of the right first toenail.  Purulent drainage was expressed from the nail fold.  The nail border was cleaned of any debris and dressed with a sterile compressive dressing consisting of bacitracin, gauze, and Coban.    Return if symptoms worsen or fail to improve.

## 2018-02-17 ENCOUNTER — Encounter: Payer: Self-pay | Admitting: Gastroenterology

## 2018-02-20 ENCOUNTER — Other Ambulatory Visit: Payer: Self-pay | Admitting: Primary Care

## 2018-02-20 ENCOUNTER — Other Ambulatory Visit: Payer: Self-pay

## 2018-02-20 DIAGNOSIS — IMO0002 Reserved for concepts with insufficient information to code with codable children: Secondary | ICD-10-CM

## 2018-02-20 MED ORDER — INSULIN SYRINGE-NEEDLE U-100 31G X 5/16" 0.3 ML MISC *A*
11 refills | Status: DC
Start: 2018-02-20 — End: 2019-02-26

## 2018-02-20 NOTE — Telephone Encounter (Signed)
Last appt: 12/17/2017  Next appt:  02/20/2018    Recent Lab Values 01/04/2018 12/17/2017 10/17/2017 10/17/2017 03/31/2017 10/29/2016 10/17/2016   EXP DATE 9/20 02/02/2018 11-03-18 08-04-18 04/04/18 04/04/2017 08/03/17   [facility] - - - - - - -

## 2018-02-20 NOTE — Telephone Encounter (Signed)
Last appt: 12/17/2017  Next appt:  Visit date not found    Recent Lab Values 01/04/2018 12/17/2017 10/17/2017 10/17/2017 03/31/2017 10/29/2016 10/17/2016   EXP DATE 9/20 02/02/2018 11-03-18 08-04-18 04/04/18 04/04/2017 08/03/17   [facility] - - - - - - -

## 2018-02-25 ENCOUNTER — Ambulatory Visit: Payer: Medicare (Managed Care) | Admitting: Physical Medicine and Rehabilitation

## 2018-02-26 ENCOUNTER — Ambulatory Visit
Payer: Medicare (Managed Care) | Attending: Physical Medicine and Rehabilitation | Admitting: Physical Medicine and Rehabilitation

## 2018-02-26 ENCOUNTER — Encounter: Payer: Self-pay | Admitting: Physical Medicine and Rehabilitation

## 2018-02-26 VITALS — BP 115/69 | HR 76 | Ht 68.0 in | Wt 264.0 lb

## 2018-02-26 DIAGNOSIS — M47816 Spondylosis without myelopathy or radiculopathy, lumbar region: Secondary | ICD-10-CM

## 2018-02-27 NOTE — Progress Notes (Signed)
ID: Kaylee Harris is a 42 y.o. year old  woman with multiple medical problems  CC:  Chief Complaint   Patient presents with    Middle Back - Pain    Lower Back - Pain     Initial eval December 2018 HPI:    has had right hip and lower back pain low back pain ongoing for a number of years right hip pain that began several months ago with a fall.  She has some sense that the pain radiates from the back to the hip show some it's pain down the left leg and into the foot.  She's had physical therapy did not help she's taken anti-inflammatories does not help.  She reports no progressive weakness no loss brought bladder control and no saddle anesthesia.    Follow-up July 02, 2017:  Since last visit she had an MRI and follow-up with that and then also had a right L4-5 facet joint injection performed    MRI showed bilateral facet joint effusion at L4-5 facet joint injection however only gave her about 15% of relief    In addition to the ongoing low back pain that is on the right side greater than left she describes pain in the lower neck radiating to the left shoulder blade    Follow-up February 26, 2018:  Meerab continues to struggle with symptoms of low back pain she's tried water treadmill with no benefit she gets somewhat orthostatic difficulties with some of these activities she had a surgery for chronic epistaxis recently she says that something showed up in the C1-2 level on her CT scan that she was concerned about.  She has low back pain that continues 6-8 out of 10 worse with increased activities.    She reports no progressive weakness to loss poor bladder control.  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 Lower abdomen with cellulitis changes, wound probed and purulent material expressed.  Had PICC line for "multiple infiltrations" of what? D/c-ed home on 10d of Augmentin 9/11.      Anginal pain      Anxiety     Arthritis     Asthma     Depression     Diabetes mellitus     Previously on SU and metformin, now diet controlled    Diverticulitis 09/2010    Dysfunctional uterine bleeding     Fibromyalgia     GERD (gastroesophageal reflux disease)     GERD (gastroesophageal reflux disease) 10/04/2016    High blood pressure     Hyperlipidemia     Liver disease     fatty liver     Long term (current) use of insulin, Dermal Adhesed V-Go Insulin Delivery Device 10/04/2016    Migraine     Neuromuscular disorder     POTS (postural orthostatic tachycardia syndrome)     Sebaceous cyst of breast     right axilla    TMJ click, left 0/05/270    Varicella        Surgical History:  Past Surgical History:   Procedure Laterality Date    APPENDECTOMY  2016    arthroscopic shoulder surgery Right 2010    Related to lifting    CARDIAC CATHETERIZATION  09/2011    negative    COLON SURGERY      COSMETIC SURGERY Bilateral 12/12/2017    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, Dr Linard Millers    DILATION AND CURETTAGE OF UTERUS  2005, 2007    x2 for menorraghia    LEFT COLECTOMY  01/03/14    Dr Tresa Res    loop recorder  Feb 03 2014    loop recorder removal      PR COLONOSCOPY THRU COLOTOMY N/A 02/16/2016    Procedure: COLONOSCOPY;  Surgeon: Kelly Splinter, MD;  Location: [facility];  Service: GI    PR CYSTOURETHROSCOPY,BIOPSY N/A 10/17/2016    Procedure: CYSTOSCOPY BLADDER ;  Surgeon: Ed Blalock, MD;  Location: [place] [facility];  Service: Urology    PR EDG TRANSORAL BIOPSY SINGLE/MULTIPLE N/A 10/29/2016    Procedure: EGD;  Surgeon: Kelly Splinter, MD;  Location: [facility];  Service: GI    TONSILLECTOMY               OIZ:TIWPYK of Systems , 10 systems reviewed all normal  pleasant woman awake alert and oriented well-developed well-nourished no acute distress        Allergies:  Allergies   Allergen Reactions    Morphine Itching    Trazodone  Anaphylaxis    Seasonal Allergies Itching    Nsaids Other (See Comments)     Bleeding/epistaxis    Other [Other] Other (See Comments)     Derma Bond(skin glue) pruritis/redness and c/o respiratory distress.   Received name: Other        Vitals:    02/26/18 1247   BP: 115/69   Pulse: 76   Weight: 119.7 kg (264 lb)   Height: 1.727 m (5\' 8" )     Body mass index is 40.14 kg/m.  Physical Exam:    Observations: as above  Presentation:.presents a timely fashion we'll dress well-groomed significant other attends with her  Affect:.normal  Gait and station:.stable nonantalgic  Postures:.normal    Testing:.They get up and out of a chair independently and without difficulty,  get up and onto an exam table independently and without difficulty.  Straight leg raise is negative.  There is no deformity of the cervical thoracic or lumbar spine to visual or palpatory inspection.  There is no tenderness over the subgluteal or trochanteric bursa. There is no atrophy of either upper or lower extremity.  Hip range of motion does not reproduce  symptoms.      Shoulder range of motion motion does not reproduce her symptoms in the subscapular region    Cervical range of motion does reproduce some subscapular symptoms.  Range of motion:.  forward flex reaching hands to shins    There is no lymphadenopathy in the cervical, inguinal or axillary regions.  Skin is intact in all four limbs and in the trunk.  Tibial and radial artery pulses are intact bilaterally.    Neuromuscular:Marland Kitchen    Motor Strength:.Strength is normal in the shoulder abductors, elbow flexors, wrist extensors and finger intrinsics bilaterally.  In the lower limbs strength is normal in the hip flexors, knee extensors, ankle dorsiflexors, and in the EHL bilaterally    Sensory Exam:.Sensation is normal to light touch to all dermatomes in the upper and lower extremities bilaterally.    Reflexes:.Reflexes are symmetric and normal at the biceps, triceps, brachioradialis, patellar  tendon, and Achilles tendon bilaterally.  Toes are downgoing.  Romberg is negative.    Imaging:.  MRI noted as above L4-5 facet joint effusion bilaterally    Other Diagnostics:.    Assessment:.  1.  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  She is now had a course of physical therapy for low back pain she has home exercise program she is done she has continued low back pain mostly related to the facet arthropathy at L4-5     We are requesting authorization once again for median branch blocks L3-L4 to treat the L4-5 facet joint arthropathy..  Plan:.  imaging is pending including x-ray and MRI she'll follow-up afterwards

## 2018-03-02 ENCOUNTER — Ambulatory Visit
Admit: 2018-03-02 | Discharge: 2018-03-02 | Disposition: A | Discharge status: Home / Self Care | Payer: Medicare (Managed Care)

## 2018-03-02 ENCOUNTER — Other Ambulatory Visit: Payer: Self-pay | Admitting: Psychiatry

## 2018-03-02 ENCOUNTER — Encounter: Payer: Self-pay | Admitting: Primary Care

## 2018-03-02 ENCOUNTER — Ambulatory Visit
Admission: AD | Admit: 2018-03-02 | Discharge: 2018-03-02 | Disposition: A | Discharge status: Home / Self Care | Payer: Medicare (Managed Care) | Source: Ambulatory Visit | Attending: Family | Admitting: Family

## 2018-03-02 DIAGNOSIS — M7989 Other specified soft tissue disorders: Secondary | ICD-10-CM | POA: Insufficient documentation

## 2018-03-02 DIAGNOSIS — L03011 Cellulitis of right finger: Secondary | ICD-10-CM | POA: Insufficient documentation

## 2018-03-02 DIAGNOSIS — S67192A Crushing injury of right middle finger, initial encounter: Secondary | ICD-10-CM | POA: Insufficient documentation

## 2018-03-02 DIAGNOSIS — Z23 Encounter for immunization: Secondary | ICD-10-CM | POA: Insufficient documentation

## 2018-03-02 MED ORDER — INFLUENZA VAC SPLIT QUAD (FLULAVAL) 0.5 ML IM SUSY PF(>=6 MONTHS) *I*
0.5000 mL | PREFILLED_SYRINGE | Freq: Once | INTRAMUSCULAR | Status: AC
Start: 2018-03-02 — End: 2018-03-02
  Administered 2018-03-02: 0.5 mL via INTRAMUSCULAR

## 2018-03-02 MED ORDER — CEPHALEXIN 500 MG PO CAPS *I*
500.0000 mg | ORAL_CAPSULE | Freq: Four times a day (QID) | ORAL | 0 refills | Status: AC
Start: 2018-03-02 — End: 2018-03-12

## 2018-03-02 NOTE — UC Provider Note (Addendum)
History     Chief Complaint   Patient presents with    Wound Infection     42 y/o right handed female with PMHx of DMII presents with concern for infection of right middle finger after moving furniture around this Saturday.  Pt reports she was moving a dresser when one of the legs slipped and fell on her finger.  Since Saturday she has noticed increased pain, redness, and swelling of her right middle finger near her nailbed.  She is worried something scraped her finger/got stuck causing an infection.  She notes DROM of middle finger only 2/2 to pain.  Denies fever, chills, drainage from wound, streaking, CP, SOB, additional injury, abd pain, N/V/D or numbness/tingling of fingers.  She has been taking tylenol without relief in symptoms.  She was recently treated in early October for an ingrown toenail by podiatry with Keflex, she reports taking the full course as directed.      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 Lower abdomen with cellulitis changes, wound probed and purulent material expressed.  Had PICC line for "multiple infiltrations" of what? D/c-ed home on 10d of Augmentin 9/11.      Anginal pain     Anxiety     Arthritis     Asthma     Depression     Diabetes mellitus     Previously on SU and metformin, now diet controlled    Diverticulitis 09/2010    Dysfunctional uterine bleeding     Fibromyalgia     GERD (gastroesophageal reflux disease)     GERD (gastroesophageal reflux disease) 10/04/2016    High blood pressure     Hyperlipidemia     Liver disease     fatty liver     Long term (current) use of insulin, Dermal Adhesed V-Go Insulin Delivery Device 10/04/2016    Migraine     Neuromuscular disorder     POTS (postural orthostatic tachycardia syndrome)     Sebaceous cyst of breast      right axilla    TMJ click, left 09/05/8411    Varicella         Patient Active Problem List   Diagnosis Code    DM (diabetes mellitus), type 2, uncontrolled E11.65    Migraine with aura G43.109    Asthma J45.909    Hyperlipemia E78.5    Fibromyalgia M79.7    Syncope and collapse- recurrent, working diagnosis vascular vs psychogenic. 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    IUD (intrauterine device) in place Z97.5    IBS (irritable bowel syndrome) K58.9    Mixed obsessional thoughts and acts F42.2    Carpal tunnel syndrome G56.00    Meralgia paresthetica of left side G57.12    No diabetic retinopathy in both eyes Z03.89    At risk for abuse of opiates Z91.89    History of repeated overdose Z91.89    Borderline personality disorder F60.3    Fatty liver disease, nonalcoholic K44.0    GERD (gastroesophageal reflux disease) N02.7    TMJ click, left O53.664    Anxiety F41.9    Bipolar 1 disorder F31.9    POTS (postural orthostatic tachycardia syndrome) R00.0,  I95.1    Traumatic hemorrhage of right cerebrum S06.349A    Diverticulitis K57.92    Long term (current) use of insulin, Dermal Adhesed V-Go Insulin Delivery Device Z79.4    Morbid obesity with BMI of 40.0-44.9, adult E66.01, Z68.41    Hematuria R31.9    Right hip pain M25.551    Labial abscess N76.4    Low back pain M54.5    Lumbar facet arthropathy M47.816    First degree heart block I44.0            Past Surgical History:   Procedure Laterality Date    APPENDECTOMY  2016    arthroscopic shoulder surgery Right 2010    Related to lifting    CARDIAC CATHETERIZATION  09/2011    negative    COLON SURGERY      COSMETIC SURGERY Bilateral 12/12/2017    B/L internal maxillary arteries embolized due to recurrent epistaxis, Dr Linard Millers    DILATION AND CURETTAGE OF UTERUS  2005, 2007    x2 for menorraghia    LEFT COLECTOMY  01/03/14    Dr Tresa Res    loop recorder   Feb 03 2014    loop recorder removal      PR COLONOSCOPY THRU COLOTOMY N/A 02/16/2016    Procedure: COLONOSCOPY;  Surgeon: Kelly Splinter, MD;  Location: [facility];  Service: GI    PR CYSTOURETHROSCOPY,BIOPSY N/A 10/17/2016    Procedure: CYSTOSCOPY BLADDER ;  Surgeon: Ed Blalock, MD;  Location[facility];  Service: Urology    PR EDG TRANSORAL BIOPSY SINGLE/MULTIPLE N/A 10/29/2016    Procedure: EGD;  Surgeon: Kelly Splinter, MD;  Location: [facility];  Service: GI    TONSILLECTOMY       Family History   Problem Relation Age of Onset    Hypertension Father     Diabetes Father     Kidney Disease Father     Hyperlipidemia Father     Heart attack Father 92    Other Father         PVD    Heart Disease Father     Hypertension Mother     Hyperlipidemia Mother     Heart attack Mother 94    Diabetes Mother     Eczema Mother     Psoriasis Mother     Heart Disease Mother     Hypertension Brother     Heart Disease Brother 39        prinzmetal's angina    Heart Disease Sister         currently having work up    [place] Sister     Hypertension Sister     Breast cancer Maternal Grandmother     Stroke Other     Cancer Sister     Thyroid disease Sister     Anesthesia problems Neg Hx           Social History     Tobacco Use    Smoking status: Former Smoker     Packs/day: 0.50     Years: 3.00     Pack years: 1.50     Types: Cigarettes    Smokeless tobacco: Never Used    Tobacco comment: quit age late 56s   Substance Use Topics    Alcohol use: Yes     Alcohol/week: 0.0 standard drinks     Comment: 0-1 drink/ month    Drug use: No     Living  Situation     Questions Responses    Patient lives with Spouse    Comment: Lives with Wife, [name]     Homeless No    Caregiver for other family member No    External Services Mental Health Services    Comment: [facility]     Employment Disabled    Domestic Violence Risk                 Review of Systems   Review of Systems    Constitutional: Negative for activity change, chills and fever.   Respiratory: Negative for shortness of breath.    Cardiovascular: Negative for chest pain.   Gastrointestinal: Negative for abdominal pain, diarrhea, nausea and vomiting.   Musculoskeletal: Positive for arthralgias and joint swelling. Negative for myalgias.   Skin: Positive for color change and wound.   Allergic/Immunologic: Positive for immunocompromised state (DMII).   Neurological: Negative for dizziness, tremors, weakness and numbness.   Psychiatric/Behavioral: Negative for self-injury and sleep disturbance.       Physical Exam   Triage Vitals  Triage Start: Start, (03/02/18 1410)   First Recorded BP: 120/79, Resp: 21, Temp: 36.6 C (97.9 F), Temp src: TEMPORAL Oxygen Therapy SpO2: 98 %, O2 Device: None (Room air), Heart Rate: 73, (03/02/18 1412)  .  First Pain Reported  0-10 Scale: 4, Pain Location/Orientation: Finger (Comment which one)(right 3rd), (03/02/18 1412)       Physical Exam  Vitals signs and nursing note reviewed.   Constitutional:       Appearance: She is not ill-appearing.   HENT:      Head: Normocephalic and atraumatic.   Cardiovascular:      Rate and Rhythm: Normal rate and regular rhythm.      Pulses:           Radial pulses are 2+ on the right side and 2+ on the left side.      Heart sounds: Normal heart sounds.   Pulmonary:      Effort: Pulmonary effort is normal.      Breath sounds: Normal breath sounds. No decreased breath sounds, wheezing, rhonchi or rales.   Musculoskeletal:      Right wrist: Normal.      Right hand: She exhibits decreased range of motion, tenderness and swelling.        Hands:       Comments: Erythema, heat, swelling,  and TTP along radial aspect of right middle finger and distal phalanx.  Area indurated and without fluctuance.  No drainage, streaking, crepitus, laceration, FB, or deformity noted.  Pain noted with flexion/extension in middle finger of R hand only.  Able to flex, extend, adduct/abduct  with full strength and perform thumb/finger opposition with all 5 fingers of R hand.  Cap refill < 2 seconds and normal sensation to light touch in all 5 fingertips of right finger.  Hand grasps equal bilaterally.No nailbed involvement   Skin:     General: Skin is warm.      Capillary Refill: Capillary refill takes less than 2 seconds.   Neurological:      Mental Status: She is alert and oriented to person, place, and time.   Psychiatric:         Attention and Perception: Attention normal.         Mood and Affect: Mood normal.         Speech: Speech normal.         Behavior: Behavior normal.  Thought Content: Thought content normal.          Medical Decision Making      Amount and/or Complexity of Data Reviewed  Tests in the radiology section of CPT: ordered and reviewed  Review and summarize past medical records: yes        Initial Evaluation:  ED First Provider Contact     Date/Time Event User Comments    03/02/18 1408 ED First Provider Contact DECICCO, CAITLYN Initial Face to Face Provider Contact          Patient was seen on: 03/02/2018        Assessment:  42 y.o.female comes to the [facility] with 2 days of worsening middle finger pain, swelling, and redness.  Prelim x-ray negative for fracture.  Afebrile and without streaking.    Differential Diagnosis includes:    Cellulitis  Abscess  Folliculitis  Skin Infection          Plan: Due to presentation of cellulitis (no fluctuance and distance from nailbed), writer informed pt I&D would likely be unsuccessful at this time (see procedure note).  Encouraged watchful waiting, pt refused and adamantly requested I&D as it is what she typically has done as a diabetic.      Kefex 500 mg QID for 10 days with epsom soaks.  Encouraged follow up with PCP to assure infection is responsive to treatment.    The nurse administered an influenza vaccine.      Orders Placed This Encounter    Incision and Drainage    * Finger middle RIGHT standard PA, Lateral, and  Oblique views    cephalexin (KEFLEX) 500 MG capsule    Influenza vaccine (FLULAVAL) syringe 0.84mL - Quad, PF, for patients aged 6 months and older     Orders Placed This Encounter   Procedures    Incision and Drainage     This order was created via procedure documentation     Standing Status:   Standing     Number of Occurrences:   1    * Finger middle RIGHT standard PA, Lateral, and Oblique views     Standing Status:   Standing     Number of Occurrences:   1     Order Specific Question:   Where should test be performed?     Answer:   [facility]     Order Specific Question:   Signs and symptoms/indications     Answer:   crush injury 3 days ago, TTP over distal phalanx/DIP.  r/o fracture.     Order Specific Question:   Patient pregnant     Answer:   Unknown       Result Date: 03/02/2018  03/02/2018 2:37 PM RIGHT FINGER X-RAYS CLINICAL INFORMATION:  crush injury 3 days ago, TTP over distal phalanx/DIP.  r/o fracture.. COMPARISON:  None. PROCEDURE: Three projections of the right middle finger were obtained. FINDINGS/IMPRESSION: No acute osseous or articular abnormality is seen. The joint spaces are maintained. Mild soft tissue swelling overlies the distal third finger.  END OF IMPRESSION [facility] submits this DICOM format image data and final report to the [facility], an independent secure electronic health information exchange, on a reciprocally searchable basis (with patient authorization) for a minimum of 12 months after exam date.      Final Diagnosis    ICD-10-CM ICD-9-CM   1. Cellulitis of right middle finger L03.011 681.00         Discharge Instructions  Please take your entire course of antibiotics even if you feel better.  You may take a probiotic or eat a probiotic yogurt to help decrease the risk of diarrhea or stomach upset.  You may wash the area gently with warm soapy water thereafter, but these do not submerge the area of your wound, or spend any time in hot tubs, public pools, or  public water sources.  Please consistently utilize warm Epsom Salt soaks 3-4 times per day on the affected area for at least 10 minutes.  For pain/inflammation relief, please continue to use tylenol over the counter as directed.  Do not pick, scratch, pluck, rub, or traumatically irritate the affected area!  If you were prescribed antibiotics, take them as directed. Finish them even if you start to feel better.    Please follow up with your PCP in 2-3 days to assure the antibiotics are working effectively.    If your wound/area of incision and drainage continues to become more red, swollen, tender, warm to the touch, or starts to show emergence of new pus-like discharge, your right hand starts to swell, you notice proximal red streaking, or you develop high fever/chills, you need to present again to our department or your nearest emergency room for further evaluation and treatment of cellulitis versus refractory infectious abscess.      Please start the new medications as below:  Current Discharge Medication List      New Medications    Details Last Dose Given Next Dose Due Script Given?   cephalexin (KEFLEX) 500 mg Dose: 500 mg  Take 500 mg by mouth 4 times daily  Quantity 40 capsule, Refill 0  Start date: 03/02/2018, End date: 03/12/2018       Comments: Emergency Encounter                 Please follow up with your physician as below:  Follow-up Information     [name], MD In 2 days.    Specialties:  Primary Care, Family Medicine  Why:  For wound re-check, As needed  Contact information:  [address]  [address]  [place] [address]  [phone]         Final Diagnosis  Final diagnoses:   [J69.678] Cellulitis of right middle finger (Primary)         Natale Lay, NP              Decicco, Letta Kocher, NP  03/02/18 Hancock, Letta Kocher, NP  03/02/18 1605    APP Fellow Attestation      Patient seen by me today, 03/02/2018 at the time of arrival 2:05 PM    History:   I reviewed this  patient, reviewed the APP Fellow's note and agree.  Exam:   I examined this patient, reviewed the APP Fellow's note and agree.    Decision Making:   I discussed with the APP Fellow his/her documented decision making  and agree.        An APP fellow is a fullylicensed and credentialed advanced practice provider. He or she is being supervised in order toobtain more expertise in emergencymedicine.    Author Andris Flurry, PA        Lonell Face [place], [place]  03/02/18 [phone]

## 2018-03-02 NOTE — ED Triage Notes (Signed)
Patient reports 3 days patient was moving and while using a lifter the dresser fell on right 3rd finger. "I think dirt got in my finger and it might be infected". Finger is now swollen and painful. Hx:DM       Triage Note   William Dalton, RN

## 2018-03-02 NOTE — Discharge Instructions (Addendum)
Please take your entire course of antibiotics even if you feel better.  You may take a probiotic or eat a probiotic yogurt to help decrease the risk of diarrhea or stomach upset.  You may wash the area gently with warm soapy water thereafter, but these do not submerge the area of your wound, or spend any time in hot tubs, public pools, or public water sources.  Please consistently utilize warm Epsom Salt soaks 3-4 times per day on the affected area for at least 10 minutes.  For pain/inflammation relief, please continue to use tylenol over the counter as directed.  Do not pick, scratch, pluck, rub, or traumatically irritate the affected area!  If you were prescribed antibiotics, take them as directed. Finish them even if you start to feel better.    Please follow up with your PCP in 2-3 days to assure the antibiotics are working effectively.    If your wound/area of incision and drainage continues to become more red, swollen, tender, warm to the touch, or starts to show emergence of new pus-like discharge, your right hand starts to swell, you notice proximal red streaking, or you develop high fever/chills, you need to present again to our department or your nearest emergency room for further evaluation and treatment of cellulitis versus refractory infectious abscess.

## 2018-03-02 NOTE — ED Procedure Documentation (Addendum)
Procedures   Incision and drainage  Date/Time: 03/02/2018 3:01 PM  Performed by: Adele Schilder, NP  Authorized by: Adele Schilder, NP     Consent:     Consent obtained:  Verbal    Consent given by:  Patient    Risks discussed:  Bleeding, incomplete drainage, infection and pain    Alternatives discussed:  No treatment, alternative treatment, delayed treatment and observation  Universal protocol:     Procedure explained and questions answered to patient or proxy's satisfaction: yes      Relevant documents present and verified: yes      Test results available and properly labeled: yes      Imaging studies available: yes      Required blood products, implants, devices, and special equipment available: yes      Site/side marked: yes      Immediately prior to procedure a time out was called: yes      Patient identity confirmed:  Verbally with patient  Location:     Type:  Abscess    Size:  0.75    Location:  Upper extremity    Upper extremity location:  Finger    Finger location:  R long finger  Pre-procedure details:     Skin preparation:  Hibiclens  Anesthesia (see MAR for exact dosages):     Anesthesia method:  Topical application    Topical anesthetic:  Lidocaine spray  Procedure details:     Needle aspiration: no      Incision types:  Single straight    Incision depth:  Dermal    Scalpel size: 18G needle.    Wound management:  Irrigated with saline    Drainage:  Bloody    Drainage amount:  Scant    Wound treatment:  Wound left open    Packing materials:  None  Post-procedure details:     Patient tolerance of procedure:  Tolerated well, no immediate complications  Comments:      Writer educated pt that due to presentation of wound, I&D would likely unsuccessful.  Pt adamantly requested I&D as it is what she typically has done for infection as a diabetic.  Unsuccessful I & D with scant amount of blood noted after incision.          Natale Lay, NP         Ajooni Karam, Letta Kocher,  NP  03/02/18 [phone]

## 2018-03-03 ENCOUNTER — Ambulatory Visit: Payer: Medicare (Managed Care) | Admitting: Pulmonary and Critical Care Medicine

## 2018-03-25 ENCOUNTER — Other Ambulatory Visit: Payer: Self-pay | Admitting: Pulmonary and Critical Care Medicine

## 2018-03-25 ENCOUNTER — Other Ambulatory Visit: Payer: Self-pay | Admitting: Primary Care

## 2018-03-25 ENCOUNTER — Ambulatory Visit: Payer: Medicare (Managed Care) | Attending: Nutrition | Admitting: Nutrition

## 2018-03-25 DIAGNOSIS — E119 Type 2 diabetes mellitus without complications: Secondary | ICD-10-CM | POA: Insufficient documentation

## 2018-03-25 DIAGNOSIS — Z794 Long term (current) use of insulin: Secondary | ICD-10-CM | POA: Insufficient documentation

## 2018-03-25 DIAGNOSIS — Z760 Encounter for issue of repeat prescription: Secondary | ICD-10-CM

## 2018-03-25 MED ORDER — TRAMADOL HCL 50 MG PO TABS *I*
50.0000 mg | ORAL_TABLET | Freq: Three times a day (TID) | ORAL | 0 refills | Status: DC | PRN
Start: 2018-03-25 — End: 2018-05-26

## 2018-03-25 MED ORDER — PROMETHAZINE HCL 25 MG PO TABS *I*
25.0000 mg | ORAL_TABLET | ORAL | 3 refills | Status: DC | PRN
Start: 2018-03-25 — End: 2019-11-09

## 2018-03-25 NOTE — Telephone Encounter (Signed)
Last appt: 12/17/2017  Next appt:  03/25/2018    Recent Lab Values 01/04/2018 12/17/2017 10/17/2017 10/17/2017 03/31/2017 10/29/2016 10/17/2016   EXP DATE 9/20 02/02/2018 11-03-18 08-04-18 04/04/18 04/04/2017 08/03/17   [facility] - - - - - - -

## 2018-03-25 NOTE — Telephone Encounter (Signed)
Last appt: 12/17/2017  Next appt:  Visit date not found    Recent Lab Values 01/04/2018 12/17/2017 10/17/2017 10/17/2017 03/31/2017 10/29/2016 10/17/2016   EXP DATE 9/20 02/02/2018 11-03-18 08-04-18 04/04/18 04/04/2017 08/03/17   [facility] - - - - - - -     Patient Name: Kaylee Harris Birth Date: 11/22/1975   Address: [place] [address] Sex: Female   Rx Written Rx Dispensed Drug Quantity Days Supply Prescriber Name Payment Method Dispenser   01/19/2018 01/22/2018 tramadol hcl 50 mg tablet  42 14 [place], [place]. #02   01/20/2018 01/22/2018 diphenoxylate-atropine 2.5-0.025 mg tablet  240 30 Telford Nab A ([employer]pas) [employer] [place]. #02   12/13/2017 12/13/2017 hydrocodone-acetaminophen 5-325 mg tablet  12 2 Haslinger, Lauren, [place]. #02   11/04/2017 11/12/2017 tramadol hcl 50 mg tablet  42 14 [place], [place]. #02   08/08/2017 08/08/2017 clonazepam 0.5 mg tablet  28 28 Corigliano, [name] D NP [employer] #02   06/25/2017 06/26/2017 lyrica 150 mg capsule  60 30 [name], Darnell Level (MD) [place]. #02   05/27/2017 05/29/2017 lyrica 150 mg capsule  60 30 [place], [place]. #02   05/09/2017 05/12/2017 diphenoxylate-atropine 2.5-0.025 mg tablet  240 30 T[place]ord Nab A ([employer]pas) Insurance [place]. #02   04/24/2017 04/24/2017 tramadol hcl 50 mg tablet  42 14 [place], [place]. #02   04/24/2017 04/24/2017 lyrica 150 mg capsule  60 30 [place], [place]. #02   04/18/2017 04/23/2017 diazepam 5 mg tablet  2 1 Orsini, [place]. #02   04/04/2017 04/04/2017 oxycodone-acetaminophen 5-325 mg tablet  10 5 [place] Carrillo, [place]. #02   04/01/2017 04/01/2017 oxycodone hcl 5 mg  tablet  6 1 [place]   04/01/2017 04/01/2017 oxycodone hcl 5 mg capsule  6 1 [place]. #02   * - Drugs marked with an asterisk are compound drugs. If the compound drug is made up of more than one controlled substance, then each controlled substance will be a separate row in the table.

## 2018-03-25 NOTE — Progress Notes (Signed)
Kaylee Harris is here today for [place] [facility] regarding Insulin requiring Type 2 Diabetes. Referred by: PCP  Here with wife, Kaylee Harris    Omni pod + Dexcpm  Going well    On Trulicity meds covered by [facility] Readings from Last 3 Encounters:   02/26/18 119.7 kg (264 lb)   02/06/18 122 kg (269 lb)   01/22/18 122 kg (269 lb)     Lab Results   Component Value Date    HA1C 7.0 (H) 01/16/2018    HA1C 7.2 (H) 10/20/2017    HA1C 9.4 (H) 05/13/2017    MALBR 0.43 04/23/2017    CREAT 0.9 12/14/2017    LDLC 79 05/13/2017       Diet: 2 meals + grazing on snacks;    Beverages: Powerade Zero; water, coffee, creamer   ETOH: no  Exercise : sedentary  ; dizzy spells    Meter: dexcom 6 report reviewed      Assessment:   DM- excellent DM control with pump; using calorie king book or app; chain restaurant websites   Reviewed low cal sm meal options for B/L as appetite is low   Reviewed extended bolus for hi fat meals    Obesity  -up 3 lb net down 17 lb Exercise very limited    Coun/Edu:     Barriers to learning:  Neuro problems  Education Provided: Plate Method label reading and carb factors    Plan:   Nutrition Goals:   1500 calories  150-180 gm carbs  Initial Weight goal: (-10%) 270 lb    Follow-up visit: 4 mo   Time spent counseling patient: 30 minutes    M[employer] insurance MD follow up referral is in eRecord  .  Extra MNT hrs ordered  MNT 1.hrs remain   Expires 05/05/18    Marjo Bicker, RD

## 2018-03-26 ENCOUNTER — Encounter: Payer: Self-pay | Admitting: Nutrition

## 2018-04-06 ENCOUNTER — Other Ambulatory Visit: Payer: Self-pay

## 2018-04-06 MED ORDER — PANTOPRAZOLE SODIUM 40 MG PO TBEC *I*
40.0000 mg | DELAYED_RELEASE_TABLET | Freq: Every day | ORAL | 5 refills | Status: DC
Start: 2018-04-06 — End: 2018-09-21

## 2018-04-06 NOTE — Telephone Encounter (Signed)
Last appt: 12/17/2017  Next appt:  Visit date not found    Recent Lab Values 01/04/2018 12/17/2017 10/17/2017 10/17/2017 03/31/2017 10/29/2016 10/17/2016   EXP DATE 9/20 02/02/2018 11-03-18 08-04-18 04/04/18 04/04/2017 08/03/17   [facility] - - - - - - -

## 2018-04-08 ENCOUNTER — Other Ambulatory Visit: Payer: Self-pay | Admitting: Psychiatry

## 2018-04-08 DIAGNOSIS — E1165 Type 2 diabetes mellitus with hyperglycemia: Secondary | ICD-10-CM

## 2018-04-20 ENCOUNTER — Other Ambulatory Visit
Admission: RE | Admit: 2018-04-20 | Discharge: 2018-04-20 | Disposition: A | Discharge status: Home / Self Care | Payer: Medicare (Managed Care) | Source: Ambulatory Visit | Attending: Psychiatry | Admitting: Psychiatry

## 2018-04-20 DIAGNOSIS — E1165 Type 2 diabetes mellitus with hyperglycemia: Secondary | ICD-10-CM | POA: Insufficient documentation

## 2018-04-20 DIAGNOSIS — IMO0002 Reserved for concepts with insufficient information to code with codable children: Secondary | ICD-10-CM

## 2018-04-21 ENCOUNTER — Encounter: Payer: Self-pay | Admitting: Psychiatry

## 2018-04-21 ENCOUNTER — Ambulatory Visit: Payer: Medicare (Managed Care) | Attending: Psychiatry | Admitting: Psychiatry

## 2018-04-21 VITALS — BP 100/80 | HR 64 | Resp 18 | Ht 68.0 in | Wt 266.0 lb

## 2018-04-21 DIAGNOSIS — E1165 Type 2 diabetes mellitus with hyperglycemia: Secondary | ICD-10-CM

## 2018-04-21 LAB — HEMOGLOBIN A1C: Hemoglobin A1C: 6.8 % — ABNORMAL HIGH

## 2018-04-21 MED ORDER — GLUCAGON 3 MG/DOSE NA POWD *I*
NASAL | 5 refills | Status: DC
Start: 2018-04-21 — End: 2021-01-19

## 2018-04-21 NOTE — Progress Notes (Signed)
Endocrinology, Diabetes, and Metabolism [facility] for Diabetes.    Consult requested by: Kaylee Harris    HPI:    This is a 42 y.o. female with a history of Type 2 Diabetes for almost 10 years.  Known complications include none.    Since her last visit, she has been doing very well. She has improved her Diabetes control significantly on her OmniPod pump. She also has the Dexcom G6 now. Her A1c is 6.8%    Denies any CP or SOB, no polyuria, no polydipsia. Occasional double vision when she feels a migraine is coming on. No blurry vision. No severe hypoglycemia requiring assistance or EMS. No paresthesias in hands or feet. No open wounds or sores on feet and performs daily self checks.Has ongoing diabetes health maintenance activities done including routine eye exams, podiatry and dental.    She had a thyroid ultrasound after her last visit which was not concerning for any acute thyroid issues. She denies any complaints    Current diabetes regimen:   trulicity 5.3GD weekly    humalog in pump  OMNIPOD  Basal Rates:  MN 0.95 units/hr  3AM 1.45 units/hr  6AM 1.2 units/hr  10PM 0.95 units/hr  ICR 8  ISF 40  Target 140  Active Insulin 4h  49% basal /51% bolus    Blood sugar logs and/or meter were brought to the visit for review.  Reviewed on Glooko  Past 30 days  Lowest BG in the 60s    Fasting 100s-150s  Lunch  100s-150s  Dinner 100s-180s  Bedtime 80s-200s    Blood sugars seem to rise after dinner with a high fat/carb meal. Not using extended bolus often.  Checking 4-6x daily. She has been eating simple carbs for snacks which elevate her sugar at bedtime and into the overnight    hypoglycemia occurs 0 times a week.  It is most often at never.  There has been the need for medical assistance for these episodes none.      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 Lower abdomen with cellulitis changes, wound probed and purulent material expressed.  Had PICC line for "multiple infiltrations" of what? D/c-ed home on 10d of Augmentin 9/11.      Anginal pain     Anxiety     Arthritis     Asthma     Depression     Diabetes mellitus     Previously on SU and metformin, now diet controlled    Diverticulitis 09/2010    Dysfunctional uterine bleeding     Fibromyalgia     GERD (gastroesophageal reflux disease)     GERD (gastroesophageal reflux disease) 10/04/2016    High blood pressure     Hyperlipidemia     Liver disease     fatty liver     Long term (current) use of insulin, Dermal Adhesed V-Go Insulin Delivery Device 10/04/2016    Migraine     Neuromuscular disorder     POTS (postural orthostatic tachycardia syndrome)     Sebaceous cyst of breast     right axilla    TMJ click, left 01/06/4267  Varicella      Past Surgical History:   Procedure Laterality Date    APPENDECTOMY  2016    arthroscopic shoulder surgery Right 2010    Related to lifting    CARDIAC CATHETERIZATION  09/2011    negative    COLON SURGERY      COSMETIC SURGERY Bilateral 12/12/2017    B/L internal maxillary arteries embolized due to recurrent epistaxis, Dr Linard Millers    DILATION AND CURETTAGE OF UTERUS  2005, 2007    x2 for menorraghia    LEFT COLECTOMY  01/03/14    Dr Tresa Res    loop recorder  Feb 03 2014    loop recorder removal      PR COLONOSCOPY THRU COLOTOMY N/A 02/16/2016    Procedure: COLONOSCOPY;  Surgeon: Kelly Splinter, Harris;  Location: [facility];  Service: GI    PR CYSTOURETHROSCOPY,BIOPSY N/A 10/17/2016    Procedure: CYSTOSCOPY BLADDER ;  Surgeon: Ed Blalock, Harris;  Location: [facility];  Service: Urology    PR EDG TRANSORAL BIOPSY SINGLE/MULTIPLE N/A 10/29/2016    Procedure: EGD;  Surgeon: Kelly Splinter, Harris;  Location:  [facility];  Service: GI    TONSILLECTOMY       Family History   Problem Relation Age of Onset    Hypertension Father     Diabetes Father     Kidney Disease Father     Hyperlipidemia Father     Heart attack Father 17    Other Father         PVD    Heart Disease Father     Hypertension Mother     Hyperlipidemia Mother     Heart attack Mother 37    Diabetes Mother     Eczema Mother     Psoriasis Mother     Heart Disease Mother     Hypertension Brother     Heart Disease Brother 47        prinzmetal's angina    Heart Disease Sister         currently having work up    [place] Sister     Hypertension Sister     Breast cancer Maternal Grandmother     Stroke Other     Cancer Sister     Thyroid disease Sister     Anesthesia problems Neg Hx      Social History     Socioeconomic History    Marital status: Married     Spouse name: Not on file    Number of children: Not on file    Years of education: Not on file    Highest education level: Not on file   Tobacco Use    Smoking status: Former Smoker     Packs/day: 0.50     Years: 3.00     Pack years: 1.50     Types: Cigarettes    Smokeless tobacco: Never Used    Tobacco comment: quit age late 77s   Substance and Sexual Activity    Alcohol use: Yes     Alcohol/week: 0.0 standard drinks     Comment: 0-1 drink/ month    Drug use: No    Sexual activity: Yes     Partners: Female     Birth control/protection: I.U.D.   Other Topics Concern    Special Diet Not Asked    Exercise Not Asked    Weight Concern Not Asked    Caffeine Concern Not Asked  Social History Narrative    ** Merged History Encounter **         Married to [employer], recently relocated back to [place] from [place] due to family stressors. 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        Allergies:   Allergies   Allergen Reactions    Morphine Itching    Trazodone Anaphylaxis    Seasonal Allergies Itching    Nsaids Other (See  Comments)     Bleeding/epistaxis    Other [Other] Other (See Comments)     Derma Bond(skin glue) pruritis/redness and c/o respiratory distress.   Received name: Other       Current Outpatient Medications on File Prior to Visit   Medication Sig Dispense Refill    Continuous Blood Gluc Sensor (DEXCOM G6 SENSOR) MISC CHANGE EVERY 10 DAYS AS NEEDED 3 each 11    pantoprazole (PROTONIX) 40 MG EC tablet Take 1 tablet (40 mg total) by mouth daily SWALLOW WHOLE. DO NOT CRUSH, BREAK, OR CHEW. 30 tablet 5    promethazine (PHENERGAN) 25 MG tablet Take 1 tablet (25 mg total) by mouth every 4-6 hours as needed 30 tablet 3    traMADol (ULTRAM) 50 MG tablet Take 1 tablet (50 mg total) by mouth every 8 hours as needed (for head pain and abd pain) Max daily dose: 150 mg 42 tablet 0    fluticasone (FLONASE) 50 MCG/ACT nasal spray SPRAY 1 SPRAY INTO EACH NOSTRIL ONE TIME DAILY 48 g 3    insulin syringe-needle U-100 (BD ULTRAFINE) 31G X 5/16" 0.3 ML HALF-UNIT Use 3 times a day as instructed. 100 each 11    VENTOLIN HFA 108 (90 Base) MCG/ACT inhaler INHALE 1 TO 2 PUFFS BY MOUTH INTO THE LUNGS EVERY 6 HOURS AS NEEDED FOR WHEEZING. SHAKE WELL BEFORE EACH USE. 18 Inhaler 5    lamoTRIgine (LAMICTAL) 150 MG tablet Take 1 tablet (150 mg total) by mouth daily 90 tablet 0    Continuous Blood Gluc Receiver (DEXCOM G6 RECEIVER) DEVI By 1 Device no specified route as needed 1 Device 0    Continuous Blood Gluc Transmit (DEXCOM G6 TRANSMITTER) MISC By 1 Device no specified route as needed   Change as needed every 90 days 1 each 3    diphenoxylate-atropine (LOMOTIL) 2.5-0.025 MG per tablet Take 1-2 tablets by mouth 4 times daily as needed for Diarrhea   Max daily dose: 8 tablets Code D 240 tablet 0    cetirizine (ZYRTEC) 10 MG tablet Take 1 tablet (10 mg total) by mouth daily 90 tablet 1    busPIRone (BUSPAR) 30 MG tablet Take 1 tablet (30 mg total) by mouth 2 times daily 60 tablet 5    famciclovir (FAMVIR) 500 MG tablet TAKE 1 TABLET  BY MOUTH THREE TIMES DAILY AS NEEDED 30 tablet 5    Alcohol Swabs (ALCOHOL PREP) PADS Use BID for BG checkUse BID for BG check 100 each 1    Insulin Disposable Pump (OMNIPOD DASH 5 PACK) MISC By 1 Device no specified route as needed 9 each 3    blood glucose test strip Test  8 times a day.   Brand name of strips Contour NEXT test strips for linked omnipod pump 250 strip 11    lancets (BAYER MICROLET) Use   8  times per day as instructed for blood glucose testing. 250 each 11    topiramate (TOPAMAX) 100 MG tablet Take 1 tablet (100 mg  total) by mouth 2 times daily 60 tablet 5    DULoxetine (CYMBALTA) 30 MG DR capsule Take 1 capsule (30 mg total) by mouth 2 times daily 180 capsule 3    mirtazapine (REMERON) 15 MG tablet Take 1 tablet (15 mg total) by mouth nightly 90 tablet 3    azelastine (OPTIVAR) 0.05 % ophthalmic solution Place 1 drop into both eyes 2 times daily      SUMAtriptan refill (IMITREX STATDOSE) 6 MG/0.5ML injection INJECT 0.5ML UNDER THE SKIN ONCE AS NEEDED FOR MIGRAINE. MAY REPEAT DOSE ONCE AFTER 1 HOUR 2 Syringe 5    dulaglutide (TRULICITY) 1.5 YT/0.1SW injection pen Inject 0.5 mLs (1.5 mg total) into the skin every 7 days 6 mL 3    glucagon (GLUCAGEN) 1 MG injection kit Inject 1 mg into the skin once as needed for Low blood sugar   Discard any unused portion. 1 each 5    clonazePAM (KLONOPIN) 0.5 MG tablet Take 1 tablet (0.5 mg total) by mouth daily as needed   Max daily dose: 0.5 mg 28 tablet 0    insulin lispro 100 UNIT/ML injection vial Inject as directed up to MDD 100 units 40 mL 11    SUMAtriptan (IMITREX) 50 MG tablet TAKE 1 TABLET BY MOUTH AS NEEDED FOR MIGRAINE, TAKE AT ONSET OF HEADACHE. MAY REPEAT ONCE IN 2 HOURS 9 tablet 5    SUMAVEL DOSEPRO 6 MG/0.5ML needle-free injection INJECT 0.5MLS (6MG TOTAL) UNDER THE SKIN AS NEEDED FOR MIGRAINE MAY REPEAT ONCE AFTER 1 HOUR IF NEEDED  1    atorvastatin (LIPITOR) 40 MG tablet Take 1 tablet (40 mg total) by mouth daily (with dinner)  90 tablet 3    metoprolol (TOPROL-XL) 25 MG 24 hr tablet Take by mouth daily         ascorbic acid (VITAMIN C) 100 MG tablet Take 100 mg by mouth daily      midodrine (PROAMATINE) 10 MG tablet Take 1 tablet (10 mg total) by mouth 3 times daily 90 tablet 0    Non-System Medication The above patient is followed in our clinic and cannot resume work permanently. 1 each 1     No current facility-administered medications on file prior to visit.      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 no  -Dawn phenomenon:  yes  -HbA1c greater than 7% or 1% over upper range of normal: yes  -History of suboptimal glycemic control before or during pregnancy: na  -LAST 30 DAYS of BG LOGS (in notes below) or ranges from: Glooko report 60s-200s    Review of Systems:  CONSTITUTIONAL:  Appetite good, no fevers, night sweats, weight loss  HEAD: No headache, dizziness or syncope  EYES:No vision changes or eye pain, intermittent blurry vision with migraine  ENT: No hearing difficulties or ear pain  CV: No chest pain, shortness of breath or edema.  RESPIRATORY:  No SOB, cough or wheezing  GI:  No nausea, vomiting, abdominal pain or change in bowel habits  GU:  No dysuria, urgency or incontinence  NEURO:  No mental status changes, motor weakness or sensory changes  PSYCH:  + depression and anxiety, stable  MS:  No joint pain, swelling or musculoskeletal deformities  SKIN:  No rashes  HEME/LYMPH:  No easy bleeding, bruising or swollen nodes  ENDOCRINE:   No polyuria, polydipsia, polyphagia or heat/cold intolerance    Physical Examination:   Vitals:    04/21/18 1404   BP: 100/80   Pulse: 64   Resp: 18   Weight: 120.7 kg (266 lb)   Height: 1.727 m ('5\' 8"' )     Body mass index is 40.45 kg/m.  Wt Readings from Last 3 Encounters:   04/21/18 120.7 kg (266 lb)   02/26/18 119.7 kg (264 lb)   02/06/18 122 kg (269 lb)       CONSTITUTIONAL:  well developed, well nourished, no acute distress  HEENT: EOMI, no lid lag, pink conjunctiva, no proptosis.   NECK: supple, no thyromegaly, no lymphadenopathy, no acanthosis nigricans  HEART/VASCULAR: RRR, normal S1, S2, no MRG, no JVD or carotid bruit, no LE edema, clubbing, or cyanosis.  Pedal pulses 2+ bilaterally.  CHEST: lungs clear to auscultation bilaterally posteriorly  ABDOMEN: soft, nontender, nondistended  EXTREMITIES:  joints without deformity or synovitis. Foot exam deferred.  NEUROLOGICAL: alert and oriented x 3. Full power bilateral upper and lower extremities. bil fine tremor noted.  SKIN:  No rashes. No lipohypertrophy.  LYMPH: no palpable lymphadenopathy.  ENDOCRINE: No supraclavicular fat pads, facial plethora, moon facies, abdominal striae, neck full on palpation, no obvious nodules  PSYCH:mood bright and affect appropriate      Labs and Imaging:    I personally reviewed and confirmed all laboratory and radiology testing listed below:      Lab results: 12/14/17  1527   Sodium 142   Potassium 3.7   Chloride 108   CO2 24   UN 15   Creatinine 0.9   GFR,Caucasian >60   GFR,Black >60   Glucose 120*   Calcium 9.3         Lab Results   Component Value Date    HA1C 6.8 (H) 04/20/2018       Lab Results   Component Value Date    ALT 17 10/16/2017    AST 14 10/16/2017     Lab Results   Component Value Date    CHOL 160 05/13/2017    HDL 34 05/13/2017    LDLC 79 05/13/2017    TRIG 234 (!) 05/13/2017    [place] 4.7 05/13/2017     Lab Results   Component Value Date    TSH 1.50 01/22/2018       Lab Results   Component Value Date    WBC  16.5 (H) 12/14/2017    HGB 13.3 12/14/2017    HCT 42 12/14/2017    MCV 88 12/14/2017    PLT 372 12/14/2017       Assessment: This is a 42 y.o. female with Type 2 Diabetes Uncontrolled with hyperglycemia.  Goal A1c is <7%. She has made substantial improvements in her blood sugar control since pump therapy and now Dexcom has been helpful. Intermittent postprandial elevations after dinner/snack with slower absorbing foods. Recommend extended bolus with those foods to better match his insulin with her food absorption    Plan:   Continue current pump settings,  use extended bolus with pizza, pasta and chinese food. Add protein with carb snacks  Continue Trulicity   BP is at goal on current therapy.  On statin therapy.  Recommend annual dilated eye exams and biannual dental exams for routine health maintenance.  hba1c before next appointment    Follow up: 3 month(s) or sooner if needed.      Nada Libman, NP    Nada Libman, NP  [facility] Department of Endocrinology  [address], [place],  [address]  Phone [phone]  Fax [phone]

## 2018-04-22 ENCOUNTER — Encounter: Payer: Self-pay | Admitting: Psychiatry

## 2018-04-22 ENCOUNTER — Encounter: Payer: Self-pay | Admitting: Primary Care

## 2018-04-22 DIAGNOSIS — M47816 Spondylosis without myelopathy or radiculopathy, lumbar region: Secondary | ICD-10-CM

## 2018-04-27 NOTE — Telephone Encounter (Signed)
Please help with referral    Kaylee Drilling, MD  [facility]  04/27/2018   6:30 AM

## 2018-05-08 ENCOUNTER — Ambulatory Visit: Payer: Medicare (Managed Care) | Attending: Primary Care | Admitting: Primary Care

## 2018-05-08 ENCOUNTER — Encounter: Payer: Self-pay | Admitting: Primary Care

## 2018-05-08 VITALS — BP 114/72 | HR 70 | Temp 98.6°F | Wt 268.2 lb

## 2018-05-08 DIAGNOSIS — M47816 Spondylosis without myelopathy or radiculopathy, lumbar region: Secondary | ICD-10-CM

## 2018-05-08 DIAGNOSIS — S31109A Unspecified open wound of abdominal wall, unspecified quadrant without penetration into peritoneal cavity, initial encounter: Secondary | ICD-10-CM

## 2018-05-08 MED ORDER — CLOTRIMAZOLE 1 % EX CREA *I*
TOPICAL_CREAM | Freq: Two times a day (BID) | CUTANEOUS | 0 refills | Status: DC
Start: 2018-05-08 — End: 2018-08-23

## 2018-05-08 MED ORDER — ZINC OXIDE 20 % EX OINT *I*
TOPICAL_OINTMENT | CUTANEOUS | 0 refills | Status: DC | PRN
Start: 2018-05-08 — End: 2018-08-23

## 2018-05-08 NOTE — Progress Notes (Signed)
[facility]    SUBJECTIVE    Pt is here to discuss:    Chief Complaint   Patient presents with    Wound Check     small hole in groin    Back Pain     neck, hips, leg pain f/u      1. Groin lesion - 53moago, pt noticed irritation, her partner examined the area, and saw what appeared to be a blood blister. A couple days later, the blister popped and she had blood on her underwear. Since then, the blister has been popping repeatedly. It does not appear to be healing. She has noticed redness around the lesion and an odor.     2. Back pain - her pain now radiates to her neck, entire back, and legs. She was scheduled for a nerve block with Dr. OKendrick Fries but insurance did not approve it. She has tried the epidural steroid shot without relief. Wondering what to do next. Referra to pain management was declined      PMH / Family Hx / Social Hx  Patient's medications, allergies, problem list, past medical, social histories were reviewed and notable for:    Current Outpatient Medications   Medication Sig Note    glucagon (BAQSIMI) 3 MG/DOSE nasal powder Inhale one dose (3 mg) into one nostril once as needed for low blood sugar. If no response in 15 min, inhale second dose from new device     Continuous Blood Gluc Sensor (DEXCOM G6 SENSOR) MISC CHANGE EVERY 10 DAYS AS NEEDED     pantoprazole (PROTONIX) 40 MG EC tablet Take 1 tablet (40 mg total) by mouth daily SWALLOW WHOLE. DO NOT CRUSH, BREAK, OR CHEW.     promethazine (PHENERGAN) 25 MG tablet Take 1 tablet (25 mg total) by mouth every 4-6 hours as needed     traMADol (ULTRAM) 50 MG tablet Take 1 tablet (50 mg total) by mouth every 8 hours as needed (for head pain and abd pain) Max daily dose: 150 mg     fluticasone (FLONASE) 50 MCG/ACT nasal spray SPRAY 1 SPRAY INTO EACH NOSTRIL ONE TIME DAILY     insulin syringe-needle U-100 (BD ULTRAFINE) 31G X 5/16" 0.3 ML HALF-UNIT Use 3 times a day as instructed.     VENTOLIN HFA 108 (90 Base) MCG/ACT inhaler  INHALE 1 TO 2 PUFFS BY MOUTH INTO THE LUNGS EVERY 6 HOURS AS NEEDED FOR WHEEZING. SHAKE WELL BEFORE EACH USE.     lamoTRIgine (LAMICTAL) 150 MG tablet Take 1 tablet (150 mg total) by mouth daily     Continuous Blood Gluc Receiver (DEXCOM G6 RECEIVER) DEVI By 1 Device no specified route as needed     Continuous Blood Gluc Transmit (DEXCOM G6 TRANSMITTER) MISC By 1 Device no specified route as needed   Change as needed every 90 days     diphenoxylate-atropine (LOMOTIL) 2.5-0.025 MG per tablet Take 1-2 tablets by mouth 4 times daily as needed for Diarrhea   Max daily dose: 8 tablets Code D     cetirizine (ZYRTEC) 10 MG tablet Take 1 tablet (10 mg total) by mouth daily     busPIRone (BUSPAR) 30 MG tablet Take 1 tablet (30 mg total) by mouth 2 times daily     famciclovir (FAMVIR) 500 MG tablet TAKE 1 TABLET BY MOUTH THREE TIMES DAILY AS NEEDED     Alcohol Swabs (ALCOHOL PREP) PADS Use BID for BG checkUse BID for BG check     Insulin  Disposable Pump (OMNIPOD DASH 5 PACK) MISC By 1 Device no specified route as needed     blood glucose test strip Test  8 times a day.   Brand name of strips Contour NEXT test strips for linked omnipod pump     lancets (BAYER MICROLET) Use   8  times per day as instructed for blood glucose testing.     topiramate (TOPAMAX) 100 MG tablet Take 1 tablet (100 mg total) by mouth 2 times daily     DULoxetine (CYMBALTA) 30 MG DR capsule Take 1 capsule (30 mg total) by mouth 2 times daily     mirtazapine (REMERON) 15 MG tablet Take 1 tablet (15 mg total) by mouth nightly     azelastine (OPTIVAR) 0.05 % ophthalmic solution Place 1 drop into both eyes 2 times daily     SUMAtriptan refill (IMITREX STATDOSE) 6 MG/0.5ML injection INJECT 0.5ML UNDER THE SKIN ONCE AS NEEDED FOR MIGRAINE. MAY REPEAT DOSE ONCE AFTER 1 HOUR     dulaglutide (TRULICITY) 1.5 UJ/8.1XB injection pen Inject 0.5 mLs (1.5 mg total) into the skin every 7 days     glucagon (GLUCAGEN) 1 MG injection kit Inject 1 mg into  the skin once as needed for Low blood sugar   Discard any unused portion.     clonazePAM (KLONOPIN) 0.5 MG tablet Take 1 tablet (0.5 mg total) by mouth daily as needed   Max daily dose: 0.5 mg     insulin lispro 100 UNIT/ML injection vial Inject as directed up to MDD 100 units     SUMAtriptan (IMITREX) 50 MG tablet TAKE 1 TABLET BY MOUTH AS NEEDED FOR MIGRAINE, TAKE AT ONSET OF HEADACHE. MAY REPEAT ONCE IN 2 HOURS     SUMAVEL DOSEPRO 6 MG/0.5ML needle-free injection INJECT 0.5MLS (6MG TOTAL) UNDER THE SKIN AS NEEDED FOR MIGRAINE MAY REPEAT ONCE AFTER 1 HOUR IF NEEDED 12/16/2016: Received from: External Pharmacy    atorvastatin (LIPITOR) 40 MG tablet Take 1 tablet (40 mg total) by mouth daily (with dinner)     metoprolol (TOPROL-XL) 25 MG 24 hr tablet Take by mouth daily    06/10/2016: Received from: External Pharmacy    ascorbic acid (VITAMIN C) 100 MG tablet Take 100 mg by mouth daily     midodrine (PROAMATINE) 10 MG tablet Take 1 tablet (10 mg total) by mouth 3 times daily     Non-System Medication The above patient is followed in our clinic and cannot resume work permanently.          OBJECTIVE  Vitals:    05/08/18 1442   BP: 114/72   Pulse: 70   Temp: 37 C (98.6 F)   TempSrc: Temporal   SpO2: 98%   Weight: 121.7 kg (268 lb 3.2 oz)     Body mass index is 40.78 kg/m.      General: well-appearing morbidly obese Caucasian female, pleasant & conversant, in NAD. Here with wife.   Psych: AAOx3, normal affect and mood. Insight and judgement intact.  Skin: R inferior inguinal fold has shallow ulcer bed measuring 54m. No surrounding erythema or macerated changes. No fluctuance or tenderness on palpation.          ASSESSMENT & PLAN    1. Wound of right groin  No signs of infection today.wound bed does not appear to have a tract c/w hidradenitis supporitiva. I recommended applying zinc oxide to clean skin after using the restroom for wound healing and barrier purposed. If any development of  fungal infection (with  odor, erythema, white exudate), instructed to use Lotrimin cream BID. Recommended wearing loose underwear that will not irritate the area.   - zinc oxide 20 % ointment; Apply topically as needed for Dry Skin  Dispense: 30 g; Refill: 0  - clotrimazole (LOTRIMIN) 1 % cream; Apply topically 2 times daily  Dispense: 15 g; Refill: 0      2. Lumbar facet arthropathy  I offered to reach out to Dr Kendrick Fries to confirm and clarify insurance barrier.       Follow-up: as scheduled in July or sooner PRN      [name], MD  [facility]  05/08/2018  3:08 PM      I [name], am scribing for and in the presence of Dr. [name]. 05/08/2018 3:08 PM.    I, Dr. [name], MD, personally performed the services described in this documentation, as scribed by [name] in my presence, and it is accurate and complete.  I have reviewed, added to, and completed this documentation personally and assure that it is complete and accurate. 05/10/2018 8:36 AM        ______________________

## 2018-05-10 ENCOUNTER — Encounter: Payer: Self-pay | Admitting: Primary Care

## 2018-05-12 ENCOUNTER — Encounter: Payer: Self-pay | Admitting: Primary Care

## 2018-05-12 ENCOUNTER — Other Ambulatory Visit: Payer: Self-pay | Admitting: Pulmonary and Critical Care Medicine

## 2018-05-12 ENCOUNTER — Other Ambulatory Visit: Payer: Self-pay | Admitting: Primary Care

## 2018-05-12 DIAGNOSIS — E1165 Type 2 diabetes mellitus with hyperglycemia: Secondary | ICD-10-CM

## 2018-05-13 ENCOUNTER — Other Ambulatory Visit: Payer: Self-pay

## 2018-05-13 ENCOUNTER — Telehealth: Payer: Self-pay | Admitting: Primary Care

## 2018-05-13 MED ORDER — TOPIRAMATE 100 MG PO TABS *I*
100.0000 mg | ORAL_TABLET | Freq: Two times a day (BID) | ORAL | 3 refills | Status: DC
Start: 2018-05-13 — End: 2019-03-25

## 2018-05-13 MED ORDER — ALCOHOL PREP PADS *I*
MEDICATED_PAD | 1 refills | Status: DC
Start: 2018-05-13 — End: 2018-07-22

## 2018-05-13 MED ORDER — FAMCICLOVIR 500 MG PO TABS *I*
500.0000 mg | ORAL_TABLET | Freq: Three times a day (TID) | ORAL | 5 refills | Status: DC | PRN
Start: 2018-05-13 — End: 2020-04-03

## 2018-05-13 NOTE — Telephone Encounter (Signed)
Last appt: 05/08/2018     Next appt:  Visit date not found        Recent Lab Values 01/04/2018 12/17/2017 10/17/2017 10/17/2017 03/31/2017 10/29/2016 10/17/2016   EXP DATE 9/20 02/02/2018 11-03-18 08-04-18 04/04/18 04/04/2017 08/03/17   [facility] - - - - - - -

## 2018-05-13 NOTE — Telephone Encounter (Signed)
Last appt: 05/08/2018     Next appt:  05/12/2018        Recent Lab Values 01/04/2018 12/17/2017 10/17/2017 10/17/2017 03/31/2017 10/29/2016 10/17/2016   EXP DATE 9/20 02/02/2018 11-03-18 08-04-18 04/04/18 04/04/2017 08/03/17   [facility] - - - - - - -

## 2018-05-13 NOTE — Telephone Encounter (Signed)
[facility] / PRACTICE CALL NOTE    Date and Time of Call: 05/13/2018 6:30 PM    PCP:  [name], MD     Problem: pt with hip and low back pain  Felt hip "pop" twice in the last day and now having difficulty bearing weight  On leg   Wants to know what she can take for pain, or if she should go to ED.   (chart not available to me at time of phone call)     Impression and Plan: Hip and low back pain  --pt has tramadol left over at home;  I instructed her to take 1 tab now and another in 1 hr if she does not have relief from the 1st tab.   -- she also has tizanidine at home; can take 1 tab of that at hs if needed for muscle spasm/sleep     Pt agreeable.   Also instructed pt to call our office in AM for same day visit if she continues to have significant pain.       The patient is instructed to call back if patient is having worsening symptoms or lack of improvement of current symptoms with the above treatment, and patient expressed understanding.    Jennelle Human, MD

## 2018-05-14 ENCOUNTER — Encounter: Payer: Self-pay | Admitting: Primary Care

## 2018-05-14 ENCOUNTER — Ambulatory Visit
Admission: RE | Admit: 2018-05-14 | Discharge: 2018-05-14 | Disposition: A | Discharge status: Home / Self Care | Payer: Medicare (Managed Care) | Source: Ambulatory Visit | Attending: Primary Care | Admitting: Primary Care

## 2018-05-14 ENCOUNTER — Ambulatory Visit
Admission: RE | Admit: 2018-05-14 | Discharge: 2018-05-14 | Disposition: A | Discharge status: Home / Self Care | Payer: Medicare (Managed Care) | Source: Ambulatory Visit

## 2018-05-14 ENCOUNTER — Ambulatory Visit: Payer: Medicare (Managed Care) | Admitting: Primary Care

## 2018-05-14 VITALS — BP 118/72 | HR 82 | Temp 98.0°F | Ht 68.0 in | Wt 274.0 lb

## 2018-05-14 DIAGNOSIS — M545 Low back pain, unspecified: Secondary | ICD-10-CM

## 2018-05-14 DIAGNOSIS — M7061 Trochanteric bursitis, right hip: Secondary | ICD-10-CM

## 2018-05-14 DIAGNOSIS — M47816 Spondylosis without myelopathy or radiculopathy, lumbar region: Secondary | ICD-10-CM

## 2018-05-14 DIAGNOSIS — M25551 Pain in right hip: Secondary | ICD-10-CM

## 2018-05-14 MED ORDER — TRIAMCINOLONE ACETONIDE 40 MG/ML IJ SUSP *I*
80.0000 mg | Freq: Once | INTRAMUSCULAR | Status: AC
Start: 2018-05-14 — End: 2018-05-14
  Administered 2018-05-14: 80 mg via INTRAMUSCULAR

## 2018-05-14 NOTE — Telephone Encounter (Signed)
Addressed at appt 05/14/2018

## 2018-05-14 NOTE — Telephone Encounter (Signed)
The patient called. She is scheduled for today with Dr. Laurance Flatten.

## 2018-05-14 NOTE — Progress Notes (Signed)
[facility]    SUBJECTIVE    Pt is here to discuss:    Chief Complaint   Patient presents with    Back Pain     Entire back, mostly on the right side, radiating on the right side down her leg now, last night while cooking she heard a pop, the night before heard a pop in her hip, pain is getting worse. Called on call last night, did the 2 Tramadol and a Tizanidine, tried heat, tried ice     1. Back pain - involving her entire back, mostly on the R side, and radiating down her R lateral thigh. Her R back feels swollen compared to the L side. 2 nights ago, she felt a pop in her R hip/groin area. Last night while cooking, she felt a pop in her lower back. She called the on call doctor last night, and took Tramadol and Tizanidine as recommended. She also tried heat and ice. She currently has 2 locations of pain; R lateral hip with radiation down lateral thigh and R lower back with radiation down posterior thigh.    She has had chronic LBP and diagnosed with lumbar facet syndrome. Did course of PT and aquatherapy.  05/2017 had facet joint of L4/5 and R4/5 without relief, then referred for medial branch block and radiofrequency ablations. per pt, these procedures were rejected by her insurance.       PMH / Family Hx / Social Hx  Patient's medications, allergies, problem list, past medical, social histories were reviewed and notable for:    Current Outpatient Medications   Medication Sig Note    famciclovir (FAMVIR) 500 MG tablet Take 1 tablet (500 mg total) by mouth 3 times daily as needed     Alcohol Swabs (ALCOHOL PREP) PADS Use BID for BG checkUse BID for BG check     topiramate (TOPAMAX) 100 MG tablet Take 1 tablet (100 mg total) by mouth 2 times daily     zinc oxide 20 % ointment Apply topically as needed for Dry Skin     clotrimazole (LOTRIMIN) 1 % cream Apply topically 2 times daily     glucagon (BAQSIMI) 3 MG/DOSE nasal powder Inhale one dose (3 mg) into one nostril once as needed for low blood  sugar. If no response in 15 min, inhale second dose from new device     Continuous Blood Gluc Sensor (DEXCOM G6 SENSOR) MISC CHANGE EVERY 10 DAYS AS NEEDED     pantoprazole (PROTONIX) 40 MG EC tablet Take 1 tablet (40 mg total) by mouth daily SWALLOW WHOLE. DO NOT CRUSH, BREAK, OR CHEW.     promethazine (PHENERGAN) 25 MG tablet Take 1 tablet (25 mg total) by mouth every 4-6 hours as needed     traMADol (ULTRAM) 50 MG tablet Take 1 tablet (50 mg total) by mouth every 8 hours as needed (for head pain and abd pain) Max daily dose: 150 mg     fluticasone (FLONASE) 50 MCG/ACT nasal spray SPRAY 1 SPRAY INTO EACH NOSTRIL ONE TIME DAILY     insulin syringe-needle U-100 (BD ULTRAFINE) 31G X 5/16" 0.3 ML HALF-UNIT Use 3 times a day as instructed.     VENTOLIN HFA 108 (90 Base) MCG/ACT inhaler INHALE 1 TO 2 PUFFS BY MOUTH INTO THE LUNGS EVERY 6 HOURS AS NEEDED FOR WHEEZING. SHAKE WELL BEFORE EACH USE.     lamoTRIgine (LAMICTAL) 150 MG tablet Take 1 tablet (150 mg total) by mouth daily  Continuous Blood Gluc Receiver (DEXCOM G6 RECEIVER) DEVI By 1 Device no specified route as needed     Continuous Blood Gluc Transmit (DEXCOM G6 TRANSMITTER) MISC By 1 Device no specified route as needed   Change as needed every 90 days     diphenoxylate-atropine (LOMOTIL) 2.5-0.025 MG per tablet Take 1-2 tablets by mouth 4 times daily as needed for Diarrhea   Max daily dose: 8 tablets Code D     cetirizine (ZYRTEC) 10 MG tablet Take 1 tablet (10 mg total) by mouth daily     busPIRone (BUSPAR) 30 MG tablet Take 1 tablet (30 mg total) by mouth 2 times daily     Insulin Disposable Pump (OMNIPOD DASH 5 PACK) MISC By 1 Device no specified route as needed     blood glucose test strip Test  8 times a day.   Brand name of strips Contour NEXT test strips for linked omnipod pump     lancets (BAYER MICROLET) Use   8  times per day as instructed for blood glucose testing.     DULoxetine (CYMBALTA) 30 MG DR capsule Take 1 capsule (30 mg  total) by mouth 2 times daily     mirtazapine (REMERON) 15 MG tablet Take 1 tablet (15 mg total) by mouth nightly     azelastine (OPTIVAR) 0.05 % ophthalmic solution Place 1 drop into both eyes 2 times daily     SUMAtriptan refill (IMITREX STATDOSE) 6 MG/0.5ML injection INJECT 0.5ML UNDER THE SKIN ONCE AS NEEDED FOR MIGRAINE. MAY REPEAT DOSE ONCE AFTER 1 HOUR     dulaglutide (TRULICITY) 1.5 EG/3.1DV injection pen Inject 0.5 mLs (1.5 mg total) into the skin every 7 days     glucagon (GLUCAGEN) 1 MG injection kit Inject 1 mg into the skin once as needed for Low blood sugar   Discard any unused portion.     clonazePAM (KLONOPIN) 0.5 MG tablet Take 1 tablet (0.5 mg total) by mouth daily as needed   Max daily dose: 0.5 mg     insulin lispro 100 UNIT/ML injection vial Inject as directed up to MDD 100 units     SUMAtriptan (IMITREX) 50 MG tablet TAKE 1 TABLET BY MOUTH AS NEEDED FOR MIGRAINE, TAKE AT ONSET OF HEADACHE. MAY REPEAT ONCE IN 2 HOURS     SUMAVEL DOSEPRO 6 MG/0.5ML needle-free injection INJECT 0.5MLS (6MG TOTAL) UNDER THE SKIN AS NEEDED FOR MIGRAINE MAY REPEAT ONCE AFTER 1 HOUR IF NEEDED 12/16/2016: Received from: External Pharmacy    atorvastatin (LIPITOR) 40 MG tablet Take 1 tablet (40 mg total) by mouth daily (with dinner)     metoprolol (TOPROL-XL) 25 MG 24 hr tablet Take by mouth daily    06/10/2016: Received from: External Pharmacy    ascorbic acid (VITAMIN C) 100 MG tablet Take 500 mg by mouth daily      midodrine (PROAMATINE) 10 MG tablet Take 1 tablet (10 mg total) by mouth 3 times daily     Non-System Medication The above patient is followed in our clinic and cannot resume work permanently.        ROS  No trauma      OBJECTIVE  Vitals:    05/14/18 1429   BP: 118/72   Pulse: 82   Temp: 36.7 C (98 F)   Weight: 124.3 kg (274 lb)   Height: 1.727 m (_0 )     Body mass index is 41.66 kg/m.      General: well-appearing morbidly obese Caucasian female,  pleasant & conversant, in NAD  Psych:  AAOx3, normal affect and mood. Insight and judgement intact.   MSK: tender throughout lumbar spinal processes, but most significantly tender at R lumbar paravertebral and SIJ. Negative seated and supine SLR. FABER testing produces pain within the R hip joint. No pain with log rolling. + pain on external rotation. R GT TTP and R ITB tight and TTP.         ASSESSMENT & PLAN    1. Lumbar facet arthropathy  2. Trochanteric bursitis of right hip  3. Low back pain  Acute flare of lumbar facet arthopathy vs SIJ arthritis/inflammation with coexisting trochanteric bursitis. TB injection given today, see procedure note. I will call Dr. Kendrick Fries to clarify radiofrequency ablation or other options for pain management. If insurance remains a barrier, may look into SIJ injections. Will update hip imaging to r/o hip OA as well.   - SACROILIAC JOINTS MIN 3 VIEWS; Future  - * Hip RIGHT AP and Lateral with pelvis; Future  - triamcinolone acetonide (KENALOG-40) injection 80 mg        Follow-up: as scheduled in July or sooner PRN      [name], MD  [place]  05/14/2018  3:20 PM      I [name], am scribing for and in the presence of Dr. [name]. 05/14/2018 3:20 PM.    I, Dr. [name], MD, personally performed the services described in this documentation, as scribed by [name] in my presence, and it is accurate and complete.  I have reviewed, added to, and completed this documentation personally and assure that it is complete and accurate. 05/14/2018 9:02 PM        ______________________

## 2018-05-14 NOTE — Procedures (Signed)
TROCHANTERIC BURSITIS INJECTION PROCEDURE NOTE  05/14/2018    Correct pt (2 identifiers)? Y  Correct procedure/site? Y  Correct materials? Y  Participants? Patient, Kaylee Drilling, MD   Location: R hip  Indication: acute pain  Time started: 3:24 PM  Time ended: 3:26 PM    CONSENT:  Risks, benefits, and alternatives to injection were discussed with the patient. Patient specifically acknowledges the possibility of pain, bleeding, infection, color change of skin,and systemic reaction. Written informed consent is obtained.    PROCEDURE:  The area of maximum point tenderness of the R lateral hip is palpated and imprinted with the needle cap. The area is cleaned with Hibiclens in the usual sterile fashion. Using topical ethyl chloride for analgesia, a mixture of 2ccof 40mg /mL of Kenalog, 4cc of 1% lidocaine without epinephrine, and 3cc of 0.25% Bupivicaineis injected into rightlateral hip site.The needle isremoved.Patient tolerated the procedure well.A bandaid was applied.    Routine aftercare and warning signs of complicationswere discussed. Advised to keep the area clean, dry and covered for 24hrs.       Kaylee Drilling, MD    I [name], am scribing for and in the presence of Dr. Johny Harris. 05/14/2018 3:26 PM      I, Dr. [name], MD, personally performed the services described in this documentation, as scribed by [name] in my presence, and it is accurate and complete.  I have reviewed, added to, and completed this documentation personally and assure that it is complete and accurate. 05/14/2018 9:03 PM

## 2018-05-15 ENCOUNTER — Other Ambulatory Visit: Payer: Self-pay | Admitting: Psychiatry

## 2018-05-15 DIAGNOSIS — E1165 Type 2 diabetes mellitus with hyperglycemia: Secondary | ICD-10-CM

## 2018-05-19 ENCOUNTER — Other Ambulatory Visit: Payer: Self-pay | Admitting: Psychiatry

## 2018-05-19 DIAGNOSIS — E119 Type 2 diabetes mellitus without complications: Secondary | ICD-10-CM

## 2018-05-20 ENCOUNTER — Encounter: Payer: Self-pay | Admitting: Psychiatry

## 2018-05-20 MED ORDER — INSULIN LISPRO (HUMAN) 100 UNIT/ML IJ/SC SOLN *WRAPPED*
SUBCUTANEOUS | 11 refills | Status: DC
Start: 2018-05-20 — End: 2019-03-22

## 2018-05-26 ENCOUNTER — Other Ambulatory Visit: Payer: Self-pay | Admitting: Pulmonary and Critical Care Medicine

## 2018-05-26 DIAGNOSIS — Z9109 Other allergy status, other than to drugs and biological substances: Secondary | ICD-10-CM

## 2018-05-26 DIAGNOSIS — Z760 Encounter for issue of repeat prescription: Secondary | ICD-10-CM

## 2018-05-27 MED ORDER — CETIRIZINE HCL 10 MG PO TABS *I*
10.0000 mg | ORAL_TABLET | Freq: Every day | ORAL | 1 refills | Status: DC
Start: 2018-05-27 — End: 2018-07-22

## 2018-05-27 MED ORDER — TRAMADOL HCL 50 MG PO TABS *I*
50.0000 mg | ORAL_TABLET | Freq: Three times a day (TID) | ORAL | 0 refills | Status: DC | PRN
Start: 2018-05-27 — End: 2018-08-20

## 2018-05-27 NOTE — Telephone Encounter (Signed)
Last appt: 05/14/2018     Next appt:  11/10/2018        Recent Lab Values 01/04/2018 12/17/2017 10/17/2017 10/17/2017 03/31/2017 10/29/2016 10/17/2016   EXP DATE 9/20 02/02/2018 11-03-18 08-04-18 04/04/18 04/04/2017 08/03/17   [facility] - - - - - - -             [name] - 8 Prescriptions  Confidential Drug Report  Search Terms: [name], April 13, 1976   Search Date: 05/27/2018 09:41:21 AM   Searching on behalf of: [id number] - [name]   The Drug Utilization Report below displays all of the controlled substance prescriptions, if any, that your patient has filled in the last twelve months. The information displayed on this report is compiled from pharmacy submissions to the Department, and accurately reflects the information as submitted by the pharmacies.  This report was requested by: Montez Morita   Reference #: [id number]   Others' Prescriptions  Patient Name: Bill Mcvey Birth Date: August 07, 1975   Address: [place], [place] [address] Sex: Female   Rx Written Rx Dispensed Drug Quantity Days Supply Prescriber Name Payment Method Dispenser   03/25/2018 03/25/2018 tramadol hcl 50 mg tablet  42 [address] [place], [place]. #02   01/19/2018 01/22/2018 tramadol hcl 50 mg tablet  [address], [place]. [place]   01/20/2018 01/22/2018 diphenoxylate-atropine 2.5-0.025 mg tablet  240 30 T[place] Nab A ([place]) [facility] #02   12/13/2017 12/13/2017 hydrocodone-acetaminophen 5-325 mg tablet  12 2 [place], [place], [place]. #02   11/04/2017 11/12/2017 tramadol hcl 50 mg tablet  27 [address] [place], [place]. #02   08/08/2017 08/08/2017 clonazepam 0.5 mg tablet  28 28 [name] D NP [facility] #02   06/25/2017 06/26/2017 lyrica 150 mg capsule  78 30 [name], Darnell Level (MD) [place]. #02   05/27/2017 05/29/2017 lyrica 150 mg capsule   21 [place], [place]. #02   * - Drugs marked with an asterisk are compound drugs. If the compound drug is made up of more than one controlled substance, then each controlled substance will be a separate row in the table.

## 2018-05-27 NOTE — Telephone Encounter (Signed)
Called an spoke with pharmacy and pharmacist stated that patient does not require a PA until 2021. Called and relayed info to patient and she will let me know if she has a problem filling it next month.

## 2018-06-03 ENCOUNTER — Other Ambulatory Visit: Payer: Self-pay

## 2018-06-03 DIAGNOSIS — F419 Anxiety disorder, unspecified: Secondary | ICD-10-CM

## 2018-06-03 MED ORDER — BUSPIRONE HCL 30 MG PO TABS *I*
30.0000 mg | ORAL_TABLET | Freq: Two times a day (BID) | ORAL | 3 refills | Status: DC
Start: 2018-06-03 — End: 2019-05-25

## 2018-06-03 NOTE — Telephone Encounter (Signed)
Last appt: 05/14/2018     Next appt:  11/10/2018        Recent Lab Values 01/04/2018 12/17/2017 10/17/2017 10/17/2017 03/31/2017 10/29/2016 10/17/2016   EXP DATE 9/20 02/02/2018 11-03-18 08-04-18 04/04/18 04/04/2017 08/03/17   [facility] - - - - - - -

## 2018-06-09 ENCOUNTER — Other Ambulatory Visit: Payer: Medicare (Managed Care) | Admitting: Registered Nurse

## 2018-06-09 DIAGNOSIS — IMO0002 Reserved for concepts with insufficient information to code with codable children: Secondary | ICD-10-CM

## 2018-06-10 ENCOUNTER — Encounter: Payer: Self-pay | Admitting: Primary Care

## 2018-06-17 ENCOUNTER — Ambulatory Visit: Payer: Medicare (Managed Care) | Attending: Psychiatry | Admitting: Registered Nurse

## 2018-06-17 DIAGNOSIS — IMO0002 Reserved for concepts with insufficient information to code with codable children: Secondary | ICD-10-CM

## 2018-06-17 DIAGNOSIS — E1165 Type 2 diabetes mellitus with hyperglycemia: Secondary | ICD-10-CM | POA: Insufficient documentation

## 2018-06-17 NOTE — Patient Instructions (Addendum)
1  Use temp basal reduction (20%) with cleaning/ exercise/activity           Can start 1/2 hr before & duration 4 hrs ; keep on addtl 1hr after        Use increase (10%) with illness over try 4- hrs & eval    2.  Extended bolus 4 hrs high fat   Pasta can try 2 hrs     3.  Evaluate changed 12n basal rate reduce to 1.1 (from 1.2) til 10 pm   eval if less trends closer to 70   Goal >75  Or 80

## 2018-06-17 NOTE — Progress Notes (Signed)
Kaylee Harris 43 y.o. female was seen today for diabetes education regarding  uncontrolled type 2 diabetes mellitus. Last seen 02/04/2018 for DSMT; now Omnipod DASH pump + DExcom G6 CGM. Post pump + CGM fu today.     Diabetes Meds: reviewed in eRecord; patient is taking as prescribed. Trulicity 1.5 mg q week;Humalog vial      Lab Results   Component Value Date    HA1C 6.8 (H) 04/20/2018    HA1C 7.0 (H) 01/16/2018    HA1C 7.2 (H) 10/20/2017    MALBR 0.43 04/23/2017    CREAT 0.9 12/14/2017    LDLC 79 05/13/2017     Self glucose monitoring:    BG meter 6 times daily. Last 14  Days average 127;  Trends of 70 range esp afternoons & post activity req Rx.   BG today:  SG 97 mg/dl    IOB 1.15 units    -- Current management: Omnipod DASH Insulin Pump  -- Insulin pump settings:  -- Basal rates:  12 AM - 0.95 units/hour  3 AM - 1.45 units/hour  6 AM - 1.20 units/hour  Added 12n to 10 pm 1.10 units/hr due to afternoon trends 70 range  10 PM - 0.95 units/hour  --TDD basal insulin: 27.2 units   TDD 52-56;    Basal 53%; bolus 47%  -- Active insulin time: 4 hours  -- Bolus wizard use: yes  -- Insulin:carb ratio: 1:8 grams  -- Sensitivity: 40 mg/dL  -- Target blood glucose: 140 mg/dL  -- Glucose sensor: G6  Low 70; high 250      Diet: 1-2 meals per day ; boluses for coffee (15 gms)  Current Activity  As tolerated  Smoking: no Alcohol: no rare     Self foot check:  self care  Eye Exam inquiry: done    Assessment:   Describes proper skill with insert PODs for pump   Describes proper skill with insert Dexcom G6 CGM  cont goal to enter all SGS from dexcom  Needs temp basal reduction with "cleaning" to prevent hypo Sx & reaction  Reduced basal 12n to 1.10 as SG trends close to 70 range  Goal for 20% temp basal reduction with activity  Met goal for extended bolus for high fat food (4 hrs 50/50%)  Omnipod pump emergency back up plan established; prefers to use old Omnipod pump & has supplies    Coun/Edu:     BARRIERS THAT AFFECT LEARNING:   Mental health ; followed by psych  READINESS TO LEARN:  good  PREFERRED METHODS OF LEARNING:  reading, demonstration and visual tools  EDUCATION PROVIDED: insulin teaching, BG Goals, using ICR and CF to determine meal bolus and  risk reduction; skin care with PODS; post pump FU  & personal CGM prob solving    Plan:     Patient Instructions   1  Use temp basal reduction (20%) with cleaning/ exercise/activity           Can start 1/2 hr before & duration 4 hrs ; keep on addtl 1hr after        Use increase (10%) with illness over try 4- hrs & eval    2.  Extended bolus 4 hrs high fat   Pasta can try 2 hrs     3.  Evaluate changed 12n basal rate reduce to 1.1 (from 1.2) til 10 pm   eval if less trends closer to 70   Goal >75  Or 80  DSMT SUPPORT PLAN:  Take advantage of your annual Medicare benefits for DSMT and MNT  Follow-up visit:  1 year  Time spent counseling patient: 60 minutes    MCR insurance MD follow up referral is in eRecord    Allowed 2 hrs Individual/group DSMT 1 hrs remain; expires 05/06/19    Valaria Good, RN CDE

## 2018-06-18 ENCOUNTER — Encounter: Payer: Self-pay | Admitting: Primary Care

## 2018-06-24 ENCOUNTER — Encounter: Payer: Self-pay | Admitting: Primary Care

## 2018-06-24 DIAGNOSIS — M47817 Spondylosis without myelopathy or radiculopathy, lumbosacral region: Secondary | ICD-10-CM

## 2018-06-26 NOTE — Telephone Encounter (Signed)
Please help with referral    Kaylee Drilling, MD  [facility]  06/26/2018   6:54 AM

## 2018-07-06 ENCOUNTER — Other Ambulatory Visit: Payer: Self-pay

## 2018-07-06 DIAGNOSIS — F603 Borderline personality disorder: Secondary | ICD-10-CM

## 2018-07-06 DIAGNOSIS — F319 Bipolar disorder, unspecified: Secondary | ICD-10-CM

## 2018-07-06 NOTE — Telephone Encounter (Signed)
Last appt: 05/14/2018     Next appt:  11/10/2018        Recent Lab Values 01/04/2018 12/17/2017 10/17/2017 10/17/2017 03/31/2017 10/29/2016 10/17/2016   EXP DATE 9/20 02/02/2018 11-03-18 08-04-18 04/04/18 04/04/2017 08/03/17   [facility] - - - - - - -

## 2018-07-07 MED ORDER — LAMOTRIGINE 150 MG PO TABS *I'
150.0000 mg | ORAL_TABLET | Freq: Every day | ORAL | 3 refills | Status: DC
Start: 2018-07-07 — End: 2019-04-05

## 2018-07-15 ENCOUNTER — Telehealth: Payer: Self-pay

## 2018-07-15 NOTE — Telephone Encounter (Signed)
You had referred this patient to pain medicine on 07/01/18. I got this message back from their office. Please advise.    Please refer to another provider After clinical review, the provider has determined we do not have additional treatment options for the patient and will defer at this time.

## 2018-07-16 ENCOUNTER — Telehealth: Payer: Self-pay

## 2018-07-16 NOTE — Telephone Encounter (Signed)
Pt called regarding connecting with Tresa Res as a therapist, pt stated that Tresa Res told her that she could return on an emergency visit. Please advise, contact for pt [phone]

## 2018-07-19 ENCOUNTER — Encounter: Payer: Self-pay | Admitting: Endocrinology

## 2018-07-19 ENCOUNTER — Encounter: Payer: Self-pay | Admitting: Primary Care

## 2018-07-20 ENCOUNTER — Ambulatory Visit: Payer: Medicare (Managed Care) | Admitting: Primary Care

## 2018-07-20 ENCOUNTER — Telehealth: Payer: Self-pay | Admitting: Endocrinology

## 2018-07-20 ENCOUNTER — Ambulatory Visit: Payer: Medicare (Managed Care) | Admitting: Endocrinology

## 2018-07-20 ENCOUNTER — Ambulatory Visit: Payer: Medicare (Managed Care) | Attending: Psychiatry

## 2018-07-20 DIAGNOSIS — F331 Major depressive disorder, recurrent, moderate: Secondary | ICD-10-CM | POA: Insufficient documentation

## 2018-07-20 DIAGNOSIS — F4312 Post-traumatic stress disorder, chronic: Secondary | ICD-10-CM | POA: Insufficient documentation

## 2018-07-20 DIAGNOSIS — Z794 Long term (current) use of insulin: Secondary | ICD-10-CM

## 2018-07-20 DIAGNOSIS — F422 Mixed obsessional thoughts and acts: Secondary | ICD-10-CM | POA: Insufficient documentation

## 2018-07-20 DIAGNOSIS — E1165 Type 2 diabetes mellitus with hyperglycemia: Secondary | ICD-10-CM

## 2018-07-20 DIAGNOSIS — F603 Borderline personality disorder: Secondary | ICD-10-CM | POA: Insufficient documentation

## 2018-07-20 NOTE — Telephone Encounter (Addendum)
Telephone Visit     Reason for visit: COVID-19 Concern      HPI:  This is a 43 y.o. female with a history of Type 2 Diabetes for almost 10 years.  Known complications include none.    Since her last visit, she has been doing very well. She has improved her Diabetes control significantly on her OmniPod pump. She also has the Dexcom G6 now. Her A1c is 6.8%.She has had steroid injections in the past without a lot of relief but none scheduled for now.    Denies any CP or SOB, no polyuria, no polydipsia. Occasional double vision when she feels a migraine is coming on. No blurry vision. No severe hypoglycemia requiring assistance or EMS. No paresthesias in hands or feet. No open wounds or sores on feet and performs daily self checks.Has ongoing diabetes health maintenance activities done including routine eye exams, podiatry and dental.    She had a thyroid ultrasound after her last visit which was not concerning for any acute thyroid issues. She denies any complaints    Current diabetes regimen:   trulicity 4.0JW weekly    Current management: Omnipod DASH Insulin Pump  -- Insulin pump settings:  -- Basal rates:  12 AM - 0.95 units/hour  3 AM - 1.45 units/hour  6 AM - 1.20 units/hour  Added 12n to 10 pm 1.10 units/hr due to afternoon trends 70 range  10 PM - 0.95 units/hour  --TDD basal insulin: 27.2 units   TDD 52-56;    Basal 53%; bolus 47%  -- Active insulin time: 4 hours  -- Bolus wizard use: yes  -- Insulin:carb ratio: 1:8 grams  -- Sensitivity: 40 mg/dL  -- Target blood glucose: 140 mg/dL  -- Glucose sensor: G6  Low 70; high 250          Blood sugar logs and/or meter were brought to the visit for review.  Reviewed on Glooko  Past 30 days  Lowest BG in the 60s    Fasting 100s-150s  Lunch  100s-150s  Dinner 100s-180s  Bedtime 80s-200s    Blood sugars seem to rise after dinner with a high fat/carb meal. Not using extended bolus often.  Checking 4-6x daily. She has been eating simple carbs for snacks which elevate her  sugar at bedtime and into the overnight    hypoglycemia occurs 0 times a week.  It is most often at never.  There has been the need for medical assistance for these episodes none.      Exercise and diet habits:  Diet: 3 meals per day  Exercise walking daily    Last dilated eye exam: routine  Foot care:  Podiatry N/A; self foot check Yes    Last dental appointment: routine      Allergies:   Allergies   Allergen Reactions    Morphine Itching    Trazodone Anaphylaxis    Seasonal Allergies Itching    Lidocaine Rash     Patches caused a rash    Nsaids Other (See Comments)     Bleeding/epistaxis    Other [Other] Other (See Comments)     Derma Bond(skin glue) pruritis/redness and c/o respiratory distress.   Received name: Other       Current Outpatient Medications on File Prior to Visit   Medication Sig Dispense Refill    lamoTRIgine (LAMICTAL) 150 MG tablet Take 1 tablet (150 mg total) by mouth daily 90 tablet 3    busPIRone (BUSPAR) 30 MG tablet Take  1 tablet (30 mg total) by mouth 2 times daily 180 tablet 3    cetirizine (ZYRTEC) 10 MG tablet Take 1 tablet (10 mg total) by mouth daily 90 tablet 1    traMADol (ULTRAM) 50 MG tablet Take 1 tablet (50 mg total) by mouth every 8 hours as needed (for head pain and abd pain) Max daily dose: 150 mg 42 tablet 0    insulin lispro 100 UNIT/ML injection vial Inject as directed up to MDD 100 units 40 mL 11    Insulin Disposable Pump (OMNIPOD DASH 5 PACK) MISC Inject 1 Device into the skin as needed change pod every 2 days 45 each 3    famciclovir (FAMVIR) 500 MG tablet Take 1 tablet (500 mg total) by mouth 3 times daily as needed 30 tablet 5    Alcohol Swabs (ALCOHOL PREP) PADS Use BID for BG checkUse BID for BG check 100 each 1    topiramate (TOPAMAX) 100 MG tablet Take 1 tablet (100 mg total) by mouth 2 times daily 180 tablet 3    zinc oxide 20 % ointment Apply topically as needed for Dry Skin 30 g 0    clotrimazole (LOTRIMIN) 1 % cream Apply topically 2 times  daily 15 g 0    glucagon (BAQSIMI) 3 MG/DOSE nasal powder Inhale one dose (3 mg) into one nostril once as needed for low blood sugar. If no response in 15 min, inhale second dose from new device 2 each 5    Continuous Blood Gluc Sensor (DEXCOM G6 SENSOR) MISC CHANGE EVERY 10 DAYS AS NEEDED 3 each 11    pantoprazole (PROTONIX) 40 MG EC tablet Take 1 tablet (40 mg total) by mouth daily SWALLOW WHOLE. DO NOT CRUSH, BREAK, OR CHEW. 30 tablet 5    promethazine (PHENERGAN) 25 MG tablet Take 1 tablet (25 mg total) by mouth every 4-6 hours as needed 30 tablet 3    fluticasone (FLONASE) 50 MCG/ACT nasal spray SPRAY 1 SPRAY INTO EACH NOSTRIL ONE TIME DAILY 48 g 3    insulin syringe-needle U-100 (BD ULTRAFINE) 31G X 5/16" 0.3 ML HALF-UNIT Use 3 times a day as instructed. 100 each 11    VENTOLIN HFA 108 (90 Base) MCG/ACT inhaler INHALE 1 TO 2 PUFFS BY MOUTH INTO THE LUNGS EVERY 6 HOURS AS NEEDED FOR WHEEZING. SHAKE WELL BEFORE EACH USE. 18 Inhaler 5    Continuous Blood Gluc Receiver (DEXCOM G6 RECEIVER) DEVI By 1 Device no specified route as needed 1 Device 0    Continuous Blood Gluc Transmit (DEXCOM G6 TRANSMITTER) MISC By 1 Device no specified route as needed   Change as needed every 90 days 1 each 3    diphenoxylate-atropine (LOMOTIL) 2.5-0.025 MG per tablet Take 1-2 tablets by mouth 4 times daily as needed for Diarrhea   Max daily dose: 8 tablets Code D 240 tablet 0    blood glucose test strip Test  8 times a day.   Brand name of strips Contour NEXT test strips for linked omnipod pump 250 strip 11    lancets (BAYER MICROLET) Use   8  times per day as instructed for blood glucose testing. 250 each 11    DULoxetine (CYMBALTA) 30 MG DR capsule Take 1 capsule (30 mg total) by mouth 2 times daily 180 capsule 3    mirtazapine (REMERON) 15 MG tablet Take 1 tablet (15 mg total) by mouth nightly 90 tablet 3    azelastine (OPTIVAR) 0.05 % ophthalmic solution Place 1  drop into both eyes 2 times daily      SUMAtriptan  refill (IMITREX STATDOSE) 6 MG/0.5ML injection INJECT 0.5ML UNDER THE SKIN ONCE AS NEEDED FOR MIGRAINE. MAY REPEAT DOSE ONCE AFTER 1 HOUR 2 Syringe 5    dulaglutide (TRULICITY) 1.5 XL/2.4MW injection pen Inject 0.5 mLs (1.5 mg total) into the skin every 7 days 6 mL 3    glucagon (GLUCAGEN) 1 MG injection kit Inject 1 mg into the skin once as needed for Low blood sugar   Discard any unused portion. 1 each 5    clonazePAM (KLONOPIN) 0.5 MG tablet Take 1 tablet (0.5 mg total) by mouth daily as needed   Max daily dose: 0.5 mg 28 tablet 0    SUMAtriptan (IMITREX) 50 MG tablet TAKE 1 TABLET BY MOUTH AS NEEDED FOR MIGRAINE, TAKE AT ONSET OF HEADACHE. MAY REPEAT ONCE IN 2 HOURS 9 tablet 5    SUMAVEL DOSEPRO 6 MG/0.5ML needle-free injection INJECT 0.5MLS (6MG TOTAL) UNDER THE SKIN AS NEEDED FOR MIGRAINE MAY REPEAT ONCE AFTER 1 HOUR IF NEEDED  1    atorvastatin (LIPITOR) 40 MG tablet Take 1 tablet (40 mg total) by mouth daily (with dinner) 90 tablet 3    metoprolol (TOPROL-XL) 25 MG 24 hr tablet Take by mouth daily         ascorbic acid (VITAMIN C) 100 MG tablet Take 500 mg by mouth daily       midodrine (PROAMATINE) 10 MG tablet Take 1 tablet (10 mg total) by mouth 3 times daily 90 tablet 0    Non-System Medication The above patient is followed in our clinic and cannot resume work permanently. 1 each 1     No current facility-administered medications on file prior to visit.      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  no  -Dawn phenomenon:  yes  -HbA1c greater than 7% or 1% over upper range of normal: yes  -History of suboptimal glycemic control before or during pregnancy: na  -LAST 30 DAYS of BG LOGS (in notes below) or ranges from: Glooko report 60s-200s    Review of Systems:  CONSTITUTIONAL:  Appetite good, no fevers, night sweats, weight loss  HEAD: No headache, dizziness or syncope  EYES:No vision changes or eye pain, intermittent blurry vision with migraine  ENT: No hearing difficulties or ear pain  CV: No chest pain, shortness of breath or edema.  RESPIRATORY:  No SOB, cough or wheezing  GI:  No nausea, vomiting, abdominal pain or change in bowel habits  GU:  No dysuria, urgency or incontinence  NEURO:  No mental status changes, motor weakness or sensory changes  PSYCH:  + depression and anxiety, stable  MS:  No joint pain, swelling or musculoskeletal deformities  SKIN:  No rashes  HEME/LYMPH:  No easy bleeding, bruising or swollen nodes  ENDOCRINE:  No polyuria, polydipsia, polyphagia or heat/cold  intolerance    Labs and Imaging:    I personally reviewed and confirmed all laboratory and radiology testing listed below:      Lab results: 12/14/17  1527   Sodium 142   Potassium 3.7   Chloride 108   CO2 24   UN 15   Creatinine 0.9   GFR,Caucasian >60   GFR,Black >60   Glucose 120*   Calcium 9.3         Lab Results   Component Value Date    HA1C 6.8 (H) 04/20/2018       Lab Results   Component Value Date    ALT 17 10/16/2017    AST 14 10/16/2017     Lab Results   Component Value Date    CHOL 160 05/13/2017    HDL 34 05/13/2017    LDLC 79 05/13/2017    TRIG 234 (!) 05/13/2017    [place] 4.7 05/13/2017     Lab Results   Component Value Date    TSH 1.50 01/22/2018       Lab Results   Component Value Date    WBC 16.5 (H) 12/14/2017    HGB 13.3 12/14/2017    HCT 42 12/14/2017    MCV 88 12/14/2017    PLT 372 12/14/2017         Patient's problem list, allergies, and medications were reviewed and updated as appropriate. Please see the  EHR for full details.    Assessment Plan:  Assessment: This is a 43 y.o. female with Type 2 Diabetes Uncontrolled with hyperglycemia.  Goal A1c is <7%. She has made substantial improvements in her blood sugar control since pump therapy and now Dexcom has been helpful. Intermittent postprandial elevations after dinner/snack with slower absorbing foods. Recommend extended bolus with those foods to better match his insulin with her food absorption    Plan:   No change to pump settings.  everything on dexcom looks great.  Continue Trulicity   On statin therapy.  Recommend annual dilated eye exams and biannual dental exams for routine health maintenance.  hba1c before next appointment      The plan was discussed with the patient and the patient/patient rep demonstrated understanding to the provider's satisfaction.    Consent was previously obtained from the patient to complete this telephone consult; including the potential for financial liability.    21+ minutes was spent reviewing the EMR and management of this patient.                 Tonette Bihari, MD  Department of Endocrinology  [address], [place],  [address]  Phone: [phone]  Fax: [phone]  Pager: [phone]

## 2018-07-20 NOTE — Progress Notes (Signed)
Behavioral Health Face to Face Crisis Intervention Note     Duration:  60 minutes      Contact Type:  Location: On Site   General Adult Ambulatory Clinic @ [facility]    Visit Diagnosis:     ICD-10-CM ICD-9-CM   1. Mixed obsessional thoughts and acts F42.2 300.3   2. Borderline personality disorder F60.3 301.83       Presenting Crisis:   Situational anxiety    Mental Status Exam:  APPEARANCE: Appears stated age, Well-groomed, Casual  ATTITUDE TOWARD INTERVIEWER: Cooperative  MOTOR ACTIVITY: WNL (within normal limits)  EYE CONTACT: Direct and Avoidant  SPEECH: Normal rate and tone and Pressured  AFFECT: Full Range and Sad  MOOD: Anxious, Depressed and Sad  THOUGHT PROCESS: Normal and Circumstantial  THOUGHT CONTENT: Negative Rumination  PERCEPTION: No evidence of hallucinations  ORIENTATION: Alert and Oriented X 3.  CONCENTRATION: Good  MEMORY:   Recent: intact   Remote: intact  COGNITIVE FUNCTION: Average intelligence  JUDGMENT:Intact  IMPULSE CONTROL: Fair  INSIGHT: Fair    Risk Assessment:  ASSESSMENT OF RISK FOR SUICIDAL BEHAVIOR  Changes in risk for suicide from baseline Formulation of Risk and/or previous intake, including newly identified risk, if any: none  Violence risk was assessed and No Change noted from baseline formulation of risk and/or previous assessment.      Session Content/Interventions and Response: Patient contacted Probation officer via [place] and requested a crisis appointment stating her anti-anxiety medication was not working and she has been experiencing a great deal of stress. Writer and patient met for a crisis appointment. Patient told Probation officer that her father is very sick and "everybody is putting everything on me". She said that her brother works, her sister works, so she and her wife are expected to help out whenever needed. Patient told writer that she was doing so much and "burning the candle at both ends" and had a POTS event, fell, hit her head. She said that her father was in the hospital then he  refused home care and she did "everything". Patient told Probation officer that her aunt and sister keep telling her that her father isn't supposed to eat certain foods, then her mother goes and buys these foods and she gets stressed out when they complain and tell her that she has to do something. Patient said that her brother is divorcing his wife and they are not living together anymore. She said that he wants to her provide childcare whenever he needs it. Patient said that her brother talked to her and her wife about moving to [place] to be live in [place] for the kids. She said that her wife thinks this is a great idea but she doesn't want to. Patient said that when they moved to [place] her wife missed her mother and knows this will likely happen again. She said that she doesn't want to move in any case due to parents health issues. Patient told Probation officer that her dog died last Feb 21, 2023. She said that she didn't want another dog, but [name] was "sad" and the other dog was "sad" so she gave in and they got a puppy in January. Writer provided empathetic support. Writer and patient discussed patient setting better boundaries with her family and observing her own limits. Patient said that this is difficult due to her fears about someone getting angry if she says no and then losing them, being alone "forever", etc. Writer validated patient's thoughts and feelings. Writer talked to patient about working to  utilize skills to set better limits, possibly setting up a schedule with her parents and brother that preserves time for herself. Patient asked writer about medications. She said that she stopped taking the lorazapam because it "wasn't working". Writer talked to patient about the multiple psychosocial stressors and the impact this has had on patient and how patient was able to manage previously before all these things happened. Patient said that she wasn't even taking the lorazapam before and had felt more in control.  Writer told patient that she would ask her previous medication provider about having a consult with the plan to have her PCP continue to prescribe.    Plan:   Patient was provided with an acute appointment to see (therapist/psychopharm provider) on 08/03/18.Marland Kitchen    Next Appointment: 08/03/18.

## 2018-07-21 ENCOUNTER — Other Ambulatory Visit: Payer: Self-pay | Admitting: Psychiatry

## 2018-07-21 ENCOUNTER — Encounter: Payer: Self-pay | Admitting: Psychiatry

## 2018-07-21 ENCOUNTER — Other Ambulatory Visit: Payer: Self-pay | Admitting: Primary Care

## 2018-07-21 DIAGNOSIS — E1165 Type 2 diabetes mellitus with hyperglycemia: Secondary | ICD-10-CM

## 2018-07-22 ENCOUNTER — Encounter: Payer: Self-pay | Admitting: Primary Care

## 2018-07-22 ENCOUNTER — Other Ambulatory Visit: Payer: Self-pay | Admitting: Psychiatry

## 2018-07-22 ENCOUNTER — Other Ambulatory Visit: Payer: Self-pay

## 2018-07-22 DIAGNOSIS — Z9109 Other allergy status, other than to drugs and biological substances: Secondary | ICD-10-CM

## 2018-07-22 DIAGNOSIS — Z0000 Encounter for general adult medical examination without abnormal findings: Secondary | ICD-10-CM

## 2018-07-22 MED ORDER — CETIRIZINE HCL 10 MG PO TABS *I*
10.0000 mg | ORAL_TABLET | Freq: Every day | ORAL | 1 refills | Status: DC
Start: 2018-07-22 — End: 2019-02-12

## 2018-07-22 MED ORDER — CLONAZEPAM 0.5 MG PO TABS *I*
0.5000 mg | ORAL_TABLET | Freq: Every day | ORAL | 0 refills | Status: DC | PRN
Start: 2018-07-22 — End: 2018-08-23

## 2018-07-22 MED ORDER — VITAMIN C 100 MG PO TABS *A*
500.0000 mg | ORAL_TABLET | Freq: Every day | ORAL | 3 refills | Status: DC
Start: 2018-07-22 — End: 2019-09-14

## 2018-07-22 NOTE — Telephone Encounter (Signed)
The patient is calling. She doesn't have any more pills left. Please advise.

## 2018-07-22 NOTE — Telephone Encounter (Signed)
Last appt: 05/14/2018     Next appt:  07/22/2018        Recent Lab Values 01/04/2018 12/17/2017 10/17/2017 10/17/2017 03/31/2017 10/29/2016 10/17/2016   EXP DATE 9/20 02/02/2018 11-03-18 08-04-18 04/04/18 04/04/2017 08/03/17   [facility] - - - - - - -

## 2018-07-22 NOTE — Telephone Encounter (Signed)
Looks like we did not provide  the last script in 08/2017.

## 2018-07-22 NOTE — Telephone Encounter (Signed)
Last appt: 05/14/2018     Next appt:  11/10/2018        Recent Lab Values 01/04/2018 12/17/2017 10/17/2017 10/17/2017 03/31/2017 10/29/2016 10/17/2016   EXP DATE 9/20 02/02/2018 11-03-18 08-04-18 04/04/18 04/04/2017 08/03/17   [facility] - - - - - - -

## 2018-07-23 ENCOUNTER — Other Ambulatory Visit: Payer: Self-pay

## 2018-07-23 DIAGNOSIS — S31109A Unspecified open wound of abdominal wall, unspecified quadrant without penetration into peritoneal cavity, initial encounter: Secondary | ICD-10-CM

## 2018-07-23 NOTE — Telephone Encounter (Signed)
Last appt: 05/14/2018     Next appt:  11/10/2018        Recent Lab Values 01/04/2018 12/17/2017 10/17/2017 10/17/2017 03/31/2017 10/29/2016 10/17/2016   EXP DATE 9/20 02/02/2018 11-03-18 08-04-18 04/04/18 04/04/2017 08/03/17   [facility] - - - - - - -

## 2018-07-24 NOTE — Telephone Encounter (Signed)
Please see the message below. I am still waiting on a answer.

## 2018-07-25 ENCOUNTER — Other Ambulatory Visit: Payer: Self-pay | Admitting: Primary Care

## 2018-07-27 NOTE — Telephone Encounter (Signed)
Last appt: 05/14/2018     Next appt:  11/10/2018        Recent Lab Values 01/04/2018 12/17/2017 10/17/2017 10/17/2017 03/31/2017 10/29/2016 10/17/2016   EXP DATE 9/20 02/02/2018 11-03-18 08-04-18 04/04/18 04/04/2017 08/03/17   [facility] - - - - - - -

## 2018-07-30 ENCOUNTER — Other Ambulatory Visit: Payer: Self-pay | Admitting: Primary Care

## 2018-07-31 NOTE — Telephone Encounter (Signed)
Last appt: 09/30/2017     Next appt:  08/03/2018        Recent Lab Values 01/04/2018 12/17/2017 10/17/2017 10/17/2017 03/31/2017 10/29/2016 10/17/2016   EXP DATE 9/20 02/02/2018 11-03-18 08-04-18 04/04/18 04/04/2017 08/03/17   [facility] - - - - - - -         on, the PMP Registry.   [name] - 8 Prescriptions  Confidential Drug Report  Search Terms: Kazia Grisanti, 1976/04/27   Search Date: 07/31/2018 10:40:44 AM   Searching on behalf of: [id number] - [name]   The Drug Utilization Report below displays all of the controlled substance prescriptions, if any, that your patient has filled in the last twelve months. The information displayed on this report is compiled from pharmacy submissions to the Department, and accurately reflects the information as submitted by the pharmacies.  This report was requested by: Julienne Kass   Reference #: [id number]   Others' Prescriptions  Patient Name: Kaylee Harris   Birth Date: 08/10/1975   Address: [place],  [address]   Sex: Female   Rx Written Rx Dispensed Drug Quantity Days Supply Prescriber Name Payment Method Dispenser   2018/07/22 2018/07/22 clonazepam 0.5 mg tablet  28 28 [name], Darnell Level (MD) [place]. #02   2018/05/27 2018/06/02 tramadol hcl 50 mg tablet  42 14 [place], [place]. [place]   2018/03/25 2018/03/25 tramadol hcl 50 mg tablet  42 14 Grove, [place]. [place]   2018/01/19 2018/01/22 tramadol hcl 50 mg tablet  42 14 Grove, [place]. [place]   2018/01/20 2018/01/22 diphenoxylate-atropine 2.5-0.025 mg tablet  240 30 Mendel Corning [facility]) [facility] #02   2017/12/13 2017/12/13 hydrocodone-acetaminophen 5-325 mg tablet  12 2 Haslinger, Lauren, [place]. #02   2017/11/04 2017/11/12 tramadol hcl 50 mg tablet  7 14 Grove, [place]. #02   2017/08/08 2017/08/08  clonazepam 0.5 mg tablet  28 28 Corigliano, Suann Larry NP [place]. #02   * - Drugs marked with an asterisk are compound drugs. If the compound drug is made up of more than one controlled substance, then each controlled substance will be a separate row in the table.

## 2018-07-31 NOTE — Progress Notes (Signed)
Pre-Visit Planning    Health Maintenance Due   Topic Date Due    URINE MICROALBUMIN  04/23/2018    LIPID DISORDER SCREENING  05/13/2018    FOOT EXAM  07/10/2018       Lab Results   Component Value Date    HA1C 6.8 (H) 04/20/2018     Lab Results   Component Value Date    MALBR 0.43 04/23/2017       Notes:      Completed on 07/31/18 by Hewitt Blade

## 2018-08-01 ENCOUNTER — Other Ambulatory Visit: Payer: Self-pay | Admitting: Pulmonary and Critical Care Medicine

## 2018-08-01 DIAGNOSIS — E1165 Type 2 diabetes mellitus with hyperglycemia: Secondary | ICD-10-CM

## 2018-08-03 ENCOUNTER — Ambulatory Visit: Payer: Medicare (Managed Care)

## 2018-08-03 DIAGNOSIS — F603 Borderline personality disorder: Secondary | ICD-10-CM

## 2018-08-03 DIAGNOSIS — F4312 Post-traumatic stress disorder, chronic: Secondary | ICD-10-CM

## 2018-08-03 DIAGNOSIS — F422 Mixed obsessional thoughts and acts: Secondary | ICD-10-CM

## 2018-08-03 DIAGNOSIS — F331 Major depressive disorder, recurrent, moderate: Secondary | ICD-10-CM

## 2018-08-03 MED ORDER — SM ALCOHOL PREP 70 % PADS
MEDICATED_PAD | 1 refills | Status: DC
Start: 2018-08-03 — End: 2018-11-23

## 2018-08-03 NOTE — Telephone Encounter (Signed)
Last appt: 05/14/2018     Next appt:  08/04/2018        Recent Lab Values 01/04/2018 12/17/2017 10/17/2017 10/17/2017 03/31/2017 10/29/2016 10/17/2016   EXP DATE 9/20 02/02/2018 11-03-18 08-04-18 04/04/18 04/04/2017 08/03/17   [facility] - - - - - - -

## 2018-08-03 NOTE — Telephone Encounter (Signed)
The patient is scheduled for a telehome tomorrow with Dr. Laurance Flatten. Dr. Laurance Flatten will talk to the patient about this then.

## 2018-08-04 ENCOUNTER — Ambulatory Visit: Payer: Medicare (Managed Care) | Attending: Primary Care | Admitting: Primary Care

## 2018-08-04 ENCOUNTER — Encounter: Payer: Self-pay | Admitting: Primary Care

## 2018-08-04 ENCOUNTER — Telehealth: Payer: Self-pay

## 2018-08-04 DIAGNOSIS — M797 Fibromyalgia: Secondary | ICD-10-CM

## 2018-08-04 DIAGNOSIS — M47816 Spondylosis without myelopathy or radiculopathy, lumbar region: Secondary | ICD-10-CM

## 2018-08-04 DIAGNOSIS — F419 Anxiety disorder, unspecified: Secondary | ICD-10-CM

## 2018-08-04 DIAGNOSIS — Z7189 Other specified counseling: Secondary | ICD-10-CM

## 2018-08-04 DIAGNOSIS — F319 Bipolar disorder, unspecified: Secondary | ICD-10-CM

## 2018-08-04 MED ORDER — QUETIAPINE FUMARATE 50 MG PO TB24 *I*
50.0000 mg | ORAL_TABLET | Freq: Every evening | ORAL | 1 refills | Status: DC
Start: 2018-08-04 — End: 2018-08-26

## 2018-08-04 MED ORDER — QUETIAPINE FUMARATE 50 MG PO TB24 *I*
100.0000 mg | ORAL_TABLET | Freq: Every evening | ORAL | 1 refills | Status: DC
Start: 2018-08-04 — End: 2018-08-04

## 2018-08-04 NOTE — Progress Notes (Signed)
Telephone Visit     This is an established patient visit.    Reason for visit: Anxiety      HPI:  1. Anxiety -   Denies depression , SI/ SA  Worsened with COVID restrictions, but predated COVID b/c of her father's illness. As asked to be present for all of his appts, and be his new HCP.   Sx are worsening though with COVID concern. Makes sure her parents don't go to grocery or on outings, frustrated though b/c they don't listen fully and are still going to pharmacy for meds. Wife also has immunocompromise (mult sclerosis) therefore she constantly worries about her.   Notices increased OCD over hand hygiene    Has been going to [facility] for therapy. Requested referral back to [facility] due to uncontrolled anxiety. Told she can't be seen until July.  Unable to transfer back to [facility] for med management therefore reqested clonazepam refill from our office. Was taking it daily without any relief, therefore stopped it.     2. 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 Pain increased.    Patient's problem list, allergies, and medications were reviewed and updated as appropriate. Please see the EHR for full details.  - h/o bipolar d/o  - h/o fibromyalgia    Assessment Plan:  1. Anxiety  2. Fibromyalgia  3. Bipolar 1 disorder    Uncontrolled anxiety with stress of father's health and now COVID pandemic/restrictions.  Given her fibromyalgia and prior lack of success with SSRIs, did not change her SNRI cymbalta.  Recommended changing mirtazepine to seroquel xr for continued sleep and anxiety benefit and mood stabilization. Will f/u in 2 weeks to see if this aides any of her symptoms.       4. Lumbar facet arthropathy  No further procedural treatments offered by ortho spine nor pain clinic  Uses tramadol sparingly  Will f/u in 2wks after seroquel started to see if mood playing role on pain perception        5. Educated About 2019 Novel  Coronavirus Infection  Encouraged to take the following steps to stay healthy:    Wash your hands with soap and water for at least 20 seconds.    Avoid touching your eyes, nose and mouth.    Stay hydrated and get plenty of rest.    Avoid close contact with people who are sick. And when you are sick, stay home.    Cover your cough or sneeze with a tissue, then throw the tissue in the trash.    Clean and disinfect frequently touched objects and surfaces.    Social distancing in public and among close family contacts   Remaining 6 feet away from others whenever possible   Reduced essential outings and/or requesting other family members to help with these errands (grocery, pharmacy)   Staying out of UC / ED and calling our office with any medical needs in the next 1-2 months.       Reviewed s/s of COVID (fever, cough, SOB) and encouraged to call office if he develops these          The plan was discussed with the patient and the patient/patient rep demonstrated understanding to the provider's satisfaction.    Consent was previously obtained from the patient to complete this telephone consult; including the potential for financial liability.    11-20 minutes was spent on the  phone with the patient, patient representatives, and/or other attendees.   1:15pm - 1:28 PM                       [name], MD  [facility]  08/04/2018  2:21 PM

## 2018-08-04 NOTE — Telephone Encounter (Signed)
Received fax from patients pharmacy [facility] .  Please process Prior Authorization for:     Disp Refills Start End    QUEtiapine (SEROQUEL XR) 50 MG 24 hr tablet 60 tablet 1 08/04/2018     Sig - Route: Take 2 tablets (100 mg total) by mouth nightly Swallow whole. Do not chew, crush, or break. - Oral    Sent to pharmacy as: QUEtiapine (SEROQUEL XR) 50 MG 24 hr tablet

## 2018-08-04 NOTE — Telephone Encounter (Signed)
Will try lower dose, in case it's a 30 pills per 30d issue    [name], MD  [facility]  08/04/2018  4:46 PM

## 2018-08-06 NOTE — Telephone Encounter (Signed)
Patient is all set no PA is needed

## 2018-08-08 ENCOUNTER — Other Ambulatory Visit: Payer: Self-pay | Admitting: Psychiatry

## 2018-08-08 DIAGNOSIS — E1165 Type 2 diabetes mellitus with hyperglycemia: Secondary | ICD-10-CM

## 2018-08-10 ENCOUNTER — Other Ambulatory Visit: Payer: Self-pay | Admitting: Primary Care

## 2018-08-10 ENCOUNTER — Encounter: Payer: Self-pay | Admitting: Psychiatry

## 2018-08-10 DIAGNOSIS — E1165 Type 2 diabetes mellitus with hyperglycemia: Secondary | ICD-10-CM

## 2018-08-10 DIAGNOSIS — Z0000 Encounter for general adult medical examination without abnormal findings: Secondary | ICD-10-CM

## 2018-08-10 MED ORDER — VITAMIN C 500 MG PO TABS *I*
500.0000 mg | ORAL_TABLET | Freq: Every day | ORAL | 3 refills | Status: AC
Start: 2018-08-10 — End: 2019-02-06

## 2018-08-10 NOTE — Telephone Encounter (Signed)
Pt is requesting a new script for the ascorbic acid 500 mg tablets due to the 100mg  tabs are no longer being produced

## 2018-08-10 NOTE — Telephone Encounter (Signed)
Last appt: 05/14/2018     Next appt:  08/18/2018        Recent Lab Values 01/04/2018 12/17/2017 10/17/2017 10/17/2017 03/31/2017 10/29/2016 10/17/2016   EXP DATE 9/20 02/02/2018 11-03-18 08-04-18 04/04/18 04/04/2017 08/03/17   [facility] - - - - - - -       Vitamin C 100  MG Tablets are no longer in production. Requesting new rx for 500 MG tablets, as the patient is taking 5 tablets by mouth everyday
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
## 2018-08-12 MED ORDER — DEXCOM G6 SENSOR MISC *A*
1.0000 | 11 refills | Status: DC | PRN
Start: 2018-08-12 — End: 2019-08-10

## 2018-08-12 MED ORDER — DEXCOM G6 RECEIVER DEVI *A*
1.0000 | 0 refills | Status: DC | PRN
Start: 2018-08-12 — End: 2020-09-22

## 2018-08-12 MED ORDER — DEXCOM G6 TRANSMITTER MISC *A*
1.0000 | 3 refills | Status: DC | PRN
Start: 2018-08-12 — End: 2019-08-29

## 2018-08-12 MED ORDER — OMNIPOD DASH PODS (GEN 4) MISC
1.0000 | 3 refills | Status: DC | PRN
Start: 2018-08-12 — End: 2018-08-25

## 2018-08-12 NOTE — Telephone Encounter (Signed)
Kaylee Harris,    We are going to try [employer]. Please send prescriptions.    Thanks,  Kaylee Harris

## 2018-08-12 NOTE — Telephone Encounter (Signed)
Colletta Maryland,    Why were these refused? We wanted to see if going through a DME would help?    Thanks,  Mickel Baas

## 2018-08-13 NOTE — Progress Notes (Signed)
Pre-Visit Planning    Health Maintenance Due   Topic Date Due    URINE MICROALBUMIN  04/23/2018    LIPID DISORDER SCREENING  05/13/2018    FOOT EXAM  07/10/2018       Lab Results   Component Value Date    HA1C 6.8 (H) 04/20/2018     Lab Results   Component Value Date    MALBR 0.43 04/23/2017       Notes:  - Last Mammogram: 11/10/2017 Completed @: [facility]     - Eye exam - up to date    - Urine microalbumin - over due 04/23/18    - A1c - due soon 11/19/18     - Foot exam - over due    Completed on 08/13/18 by Tanna Savoy

## 2018-08-15 NOTE — NoShare Progress Note (Signed)
Telephone Visit     Reason for visit: COVID-19 Concern      HPI:  This is a 43 y.o. female with a history of Type 2 Diabetes for almost 10 years.  Known complications include none.    Since her last visit, she has been doing very well. She has improved her Diabetes control significantly on her OmniPod pump. She also has the Dexcom G6 now. Her A1c is 6.8%.She has had steroid injections in the past without a lot of relief but none scheduled for now.    Denies any CP or SOB, no polyuria, no polydipsia. Occasional double vision when she feels a migraine is coming on. No blurry vision. No severe hypoglycemia requiring assistance or EMS. No paresthesias in hands or feet. No open wounds or sores on feet and performs daily self checks.Has ongoing diabetes health maintenance activities done including routine eye exams, podiatry and dental.    She had a thyroid ultrasound after her last visit which was not concerning for any acute thyroid issues. She denies any complaints    Current diabetes regimen:   trulicity 1.7OH weekly    Current management: Omnipod DASH Insulin Pump  -- Insulin pump settings:  -- Basal rates:  12 AM - 0.95 units/hour  3 AM - 1.45 units/hour  6 AM - 1.20 units/hour  Added 12n to 10 pm 1.10 units/hr due to afternoon trends 70 range  10 PM - 0.95 units/hour  --TDD basal insulin: 27.2 units   TDD 52-56;    Basal 53%; bolus 47%  -- Active insulin time: 4 hours  -- Bolus wizard use: yes  -- Insulin:carb ratio: 1:8 grams  -- Sensitivity: 40 mg/dL  -- Target blood glucose: 140 mg/dL  -- Glucose sensor: G6  Low 70; high 250          Blood sugar logs and/or meter were brought to the visit for review.  Reviewed on Glooko  Past 30 days  Lowest BG in the 60s    Fasting 100s-150s  Lunch  100s-150s  Dinner 100s-180s  Bedtime 80s-200s    Blood sugars seem to rise after dinner with a high fat/carb meal. Not using extended bolus often.  Checking 4-6x daily. She has been eating simple carbs for snacks which  elevate her sugar at bedtime and into the overnight    hypoglycemia occurs 0 times a week.  It is most often at never.  There has been the need for medical assistance for these episodes none.      Exercise and diet habits:  Diet: 3 meals per day  Exercise walking daily    Last dilated eye exam: routine  Foot care:  Podiatry N/A; self foot check Yes    Last dental appointment: routine      Allergies:         Allergies   Allergen Reactions    Morphine Itching    Trazodone Anaphylaxis    Seasonal Allergies Itching    Lidocaine Rash     Patches caused a rash    Nsaids Other (See Comments)     Bleeding/epistaxis    Other [Other] Other (See Comments)     Derma Bond(skin glue) pruritis/redness and c/o respiratory distress.   Received name: Other              Current Outpatient Medications on File Prior to Visit   Medication Sig Dispense Refill    lamoTRIgine (LAMICTAL) 150 MG tablet Take 1 tablet (150 mg total) by mouth  daily 90 tablet 3    busPIRone (BUSPAR) 30 MG tablet Take 1 tablet (30 mg total) by mouth 2 times daily 180 tablet 3    cetirizine (ZYRTEC) 10 MG tablet Take 1 tablet (10 mg total) by mouth daily 90 tablet 1    traMADol (ULTRAM) 50 MG tablet Take 1 tablet (50 mg total) by mouth every 8 hours as needed (for head pain and abd pain) Max daily dose: 150 mg 42 tablet 0    insulin lispro 100 UNIT/ML injection vial Inject as directed up to MDD 100 units 40 mL 11    Insulin Disposable Pump (OMNIPOD DASH 5 PACK) MISC Inject 1 Device into the skin as needed change pod every 2 days 45 each 3    famciclovir (FAMVIR) 500 MG tablet Take 1 tablet (500 mg total) by mouth 3 times daily as needed 30 tablet 5    Alcohol Swabs (ALCOHOL PREP) PADS Use BID for BG checkUse BID for BG check 100 each 1    topiramate (TOPAMAX) 100 MG tablet Take 1 tablet (100 mg total) by mouth 2 times daily 180 tablet 3    zinc oxide 20 % ointment Apply topically as needed for Dry Skin 30 g 0    clotrimazole (LOTRIMIN)  1 % cream Apply topically 2 times daily 15 g 0    glucagon (BAQSIMI) 3 MG/DOSE nasal powder Inhale one dose (3 mg) into one nostril once as needed for low blood sugar. If no response in 15 min, inhale second dose from new device 2 each 5    Continuous Blood Gluc Sensor (DEXCOM G6 SENSOR) MISC CHANGE EVERY 10 DAYS AS NEEDED 3 each 11    pantoprazole (PROTONIX) 40 MG EC tablet Take 1 tablet (40 mg total) by mouth daily SWALLOW WHOLE. DO NOT CRUSH, BREAK, OR CHEW. 30 tablet 5    promethazine (PHENERGAN) 25 MG tablet Take 1 tablet (25 mg total) by mouth every 4-6 hours as needed 30 tablet 3    fluticasone (FLONASE) 50 MCG/ACT nasal spray SPRAY 1 SPRAY INTO EACH NOSTRIL ONE TIME DAILY 48 g 3    insulin syringe-needle U-100 (BD ULTRAFINE) 31G X 5/16" 0.3 ML HALF-UNIT Use 3 times a day as instructed. 100 each 11    VENTOLIN HFA 108 (90 Base) MCG/ACT inhaler INHALE 1 TO 2 PUFFS BY MOUTH INTO THE LUNGS EVERY 6 HOURS AS NEEDED FOR WHEEZING. SHAKE WELL BEFORE EACH USE. 18 Inhaler 5    Continuous Blood Gluc Receiver (DEXCOM G6 RECEIVER) DEVI By 1 Device no specified route as needed 1 Device 0    Continuous Blood Gluc Transmit (DEXCOM G6 TRANSMITTER) MISC By 1 Device no specified route as needed   Change as needed every 90 days 1 each 3    diphenoxylate-atropine (LOMOTIL) 2.5-0.025 MG per tablet Take 1-2 tablets by mouth 4 times daily as needed for Diarrhea   Max daily dose: 8 tablets Code D 240 tablet 0    blood glucose test strip Test  8 times a day.   Brand name of strips Contour NEXT test strips for linked omnipod pump 250 strip 11    lancets (BAYER MICROLET) Use   8  times per day as instructed for blood glucose testing. 250 each 11    DULoxetine (CYMBALTA) 30 MG DR capsule Take 1 capsule (30 mg total) by mouth 2 times daily 180 capsule 3    mirtazapine (REMERON) 15 MG tablet Take 1 tablet (15 mg total) by mouth nightly 90  tablet 3    azelastine (OPTIVAR) 0.05 % ophthalmic solution Place 1 drop into both eyes  2 times daily      SUMAtriptan refill (IMITREX STATDOSE) 6 MG/0.5ML injection INJECT 0.5ML UNDER THE SKIN ONCE AS NEEDED FOR MIGRAINE. MAY REPEAT DOSE ONCE AFTER 1 HOUR 2 Syringe 5    dulaglutide (TRULICITY) 1.5 ZD/6.6YQ injection pen Inject 0.5 mLs (1.5 mg total) into the skin every 7 days 6 mL 3    glucagon (GLUCAGEN) 1 MG injection kit Inject 1 mg into the skin once as needed for Low blood sugar   Discard any unused portion. 1 each 5    clonazePAM (KLONOPIN) 0.5 MG tablet Take 1 tablet (0.5 mg total) by mouth daily as needed   Max daily dose: 0.5 mg 28 tablet 0    SUMAtriptan (IMITREX) 50 MG tablet TAKE 1 TABLET BY MOUTH AS NEEDED FOR MIGRAINE, TAKE AT ONSET OF HEADACHE. MAY REPEAT ONCE IN 2 HOURS 9 tablet 5    SUMAVEL DOSEPRO 6 MG/0.5ML needle-free injection INJECT 0.5MLS (6MG TOTAL) UNDER THE SKIN AS NEEDED FOR MIGRAINE MAY REPEAT ONCE AFTER 1 HOUR IF NEEDED  1    atorvastatin (LIPITOR) 40 MG tablet Take 1 tablet (40 mg total) by mouth daily (with dinner) 90 tablet 3    metoprolol (TOPROL-XL) 25 MG 24 hr tablet Take by mouth daily         ascorbic acid (VITAMIN C) 100 MG tablet Take 500 mg by mouth daily       midodrine (PROAMATINE) 10 MG tablet Take 1 tablet (10 mg total) by mouth 3 times daily 90 tablet 0    Non-System Medication The above patient is followed in our clinic and cannot resume work permanently. 1 each 1     No current facility-administered medications on file prior to visit.      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 no  -Dawn phenomenon:  yes  -HbA1c greater than 7% or 1% over upper range of normal: yes  -History of suboptimal glycemic control before or during pregnancy: na  -LAST 30 DAYS of BG LOGS (in notes below) or ranges from: Glooko report 60s-200s    Review of Systems:  CONSTITUTIONAL:  Appetite good, no fevers, night sweats, weight loss  HEAD: No headache, dizziness or syncope  EYES:No vision changes or eye pain, intermittent blurry vision with migraine  ENT: No hearing difficulties or ear pain  CV: No chest pain, shortness of breath or edema.  RESPIRATORY:  No SOB, cough or wheezing  GI:  No nausea, vomiting, abdominal pain or change in bowel habits  GU:  No dysuria, urgency or incontinence  NEURO:  No mental status changes, motor weakness or sensory changes  PSYCH:  + depression and anxiety, stable  MS:  No joint pain, swelling or musculoskeletal deformities  SKIN:  No rashes  HEME/LYMPH:  No easy bleeding, bruising or swollen nodes  ENDOCRINE:  No polyuria, polydipsia, polyphagia or heat/cold intolerance    Labs and Imaging:    I personally reviewed and confirmed all laboratory and radiology testing listed below:          Lab results: 12/14/17  1527   Sodium 142   Potassium 3.7   Chloride 108   CO2 24   UN 15   Creatinine 0.9   GFR,Caucasian >60   GFR,Black >60   Glucose 120*   Calcium 9.3               Lab Results   Component Value Date    HA1C 6.8 (H) 04/20/2018             Lab Results   Component Value Date    ALT 17 10/16/2017    AST 14 10/16/2017           Lab Results   Component Value Date    CHOL 160 05/13/2017    HDL 34 05/13/2017    LDLC 79 05/13/2017    TRIG 234 (!) 05/13/2017    [place] 4.7 05/13/2017           Lab Results   Component Value Date    TSH 1.50 01/22/2018             Lab Results   Component Value Date    WBC 16.5 (H) 12/14/2017    HGB 13.3 12/14/2017    HCT 42 12/14/2017    MCV 88 12/14/2017    PLT 372 12/14/2017          Patient's problem list, allergies, and medications were reviewed and updated as appropriate. Please see the EHR for full details.    Assessment Plan:  Assessment: This is a 43 y.o. female with Type 2 Diabetes Uncontrolled with hyperglycemia.  Goal A1c is <7%. She has made substantial improvements in her blood sugar control since pump therapy and now Dexcom has been helpful. Intermittent postprandial elevations after dinner/snack with slower absorbing foods. Recommend extended bolus with those foods to better match his insulin with her food absorption    Plan:   No change to pump settings.  everything on dexcom looks great.  Continue Trulicity   On statin therapy.  Recommend annual dilated eye exams and biannual dental exams for routine health maintenance.  hba1c before next appointment      The plan was discussed with the patient and the patient/patient rep demonstrated understanding to the provider's satisfaction.    Consent was previously obtained from the patient to complete this telephone consult; including the potential for financial liability.    21+ minutes was spent reviewing the EMR and management of this patient.       Tonette Bihari, MD  Department of Endocrinology  [address], [place], [place] [address]  Phone: [phone]  Fax: [phone]  Pager: [phone]          Electronically signed by Tonette Bihari, MD at 07/20/2018 11:48 AM  Electronically signed by Tonette Bihari, MD at 07/21/2018 1:59 PM

## 2018-08-17 ENCOUNTER — Ambulatory Visit: Payer: Medicare (Managed Care)

## 2018-08-17 DIAGNOSIS — F422 Mixed obsessional thoughts and acts: Secondary | ICD-10-CM

## 2018-08-17 DIAGNOSIS — F4312 Post-traumatic stress disorder, chronic: Secondary | ICD-10-CM

## 2018-08-17 DIAGNOSIS — F331 Major depressive disorder, recurrent, moderate: Secondary | ICD-10-CM

## 2018-08-17 DIAGNOSIS — F603 Borderline personality disorder: Secondary | ICD-10-CM

## 2018-08-17 NOTE — Progress Notes (Signed)
Behavioral Health Progress Note   Patient encounter was performed via      []  Real time audiovisual    [x]   Telephone    Patient located at Home    Provider located at:   [x]  Home office  []  Clinical office   []   Administrative office         Other participants in this encounter and roles:  N/A    Length of Session: 45 minutes    Contact Type:  Location: telemedicine    Telephone Contact     Problem(s)/Goals Addressed from Treatment Plan:    Problem 1:   Treatment Problem #1 09/03/2017   Patient Identified Problem OCD       Goal for this problem:    Treatment Goal #1 09/03/2017   Patient Identified Goal Work to reduce obsessional thoughts and actions       Progress towards this goal: N/A - Initial plan    Mental Status Exam:  APPEARANCE: Not assessed  ATTITUDE TOWARD INTERVIEWER: Cooperative  MOTOR ACTIVITY: Not assessed  EYE CONTACT: not assessed  SPEECH: Normal rate and tone  AFFECT: Not assessed  MOOD: Anxious and Depressed  THOUGHT PROCESS: Circumstantial  THOUGHT CONTENT: Negative Rumination  PERCEPTION: No evidence of hallucinations  CURRENT SUICIDAL IDEATION: patient denies  CURRENT HOMICIDAL IDEATION: Patient denies  ORIENTATION: Alert and Oriented X 3.  CONCENTRATION: Good and Fair  MEMORY:   Recent: intact   Remote: intact  COGNITIVE FUNCTION: Average intelligence  JUDGMENT: Intact  IMPULSE CONTROL: Fair  INSIGHT: Fair    Risk Assessment:  ASSESSMENT OF RISK FOR SUICIDAL BEHAVIOR  Changes in risk for suicide from baseline Formulation of Risk and/or previous intake, including newly identified risk, if any: none  Violence risk was assessed and No Change noted from baseline formulation of risk and/or previous assessment.    Session Content::  Patient had her third session in her new episode of care. Patient said that she is dealing with the "usual" which she described as stress, worrying about "everybody", "panicking". Patient said that her new medicine seems to be helping but she is "short" with her wife, the  dogs, etc. Writer validated patient's thoughts and feelings. Writer and patient talked about situational things that could be contributing to her mood. Patient said that this morning she was reading things about nursing homes and Covid-19, she said that she was drinking her coffee and asked [name] to take the dogs out but this request was denied. She said that she doesn't have any time to herself and that the dogs are "always" on her. Patient said that even when she tries to go into the bedroom, [name] lets the dogs in. She said that when she sleeps they are on her too. Patient said that she has been worried about her father as he is getting swollen again. She said that she is afraid he'll have go to the hospital again. Patient said that she is "tossing and turning" all night. Writer provided empathetic support. Writer and patient discussed patient talking to her PCP about whether she wants to discontinue the medication or see if things improve once it has a chance to take full affect. Writer talked to patient about pulling out diary cards, using DBT skills, etc. And practicing self compassion. Patient said that she would try these things.    Visit Diagnosis:      ICD-10-CM ICD-9-CM   1. Mixed obsessional thoughts and acts F42.2 300.3   2. Borderline personality disorder F60.3  301.83   3. MDD (major depressive disorder), recurrent episode, moderate F33.1 296.32   4. Chronic post-traumatic stress disorder (PTSD) F43.12 309.81       Interventions:  Supportive Psychotherapy  Taught/practiced coping skills (specify skills used):  Distress tolerance, Mindfulness, activities  Treatment Planning    Current Treatment Plan   Created/Updated On 09/03/2017   Next Treatment Plan Due 12/03/2017         Plan:  Psychotherapy continues as described in care plan; plan remains the same.    NEXT APPT: 08/31/18.      Tresa Res, LCSW-R

## 2018-08-18 ENCOUNTER — Ambulatory Visit: Payer: Medicare (Managed Care) | Attending: Primary Care | Admitting: Primary Care

## 2018-08-18 ENCOUNTER — Encounter: Payer: Self-pay | Admitting: Primary Care

## 2018-08-18 DIAGNOSIS — F419 Anxiety disorder, unspecified: Secondary | ICD-10-CM

## 2018-08-18 NOTE — Progress Notes (Signed)
[facility]    SUBJECTIVE    Pt is here to discuss:    Chief Complaint   Patient presents with    Anxiety     follow up on seroquel      1. F/u anxiety - tolerating seroquel 55m XR. Wife comments that it seems to be relaxing her more and she appears more happy. Pt agrees she feels better, but has hard time explaining exactly why. Feels less urgency to "get up and do things immediately". Pt still worries about her father's and her wife's health, and fearful they would die if they got COVID. This is her main source of anxiety and irritability.     She is working with therapist lisa, trying distraction techniques like coloring  Rarely watches news/social media/radio about COVID.  Admits to a friend who talks about it constantly , which upsets her      PMH / Family Hx / Social Hx  Patient's medications, allergies, problem list, past medical, social histories were reviewed and notable for:       Current Outpatient Medications   Medication Sig Note    QUEtiapine (SEROQUEL XR) 50 MG 24 hr tablet Take 1 tablet (50 mg total) by mouth nightly Swallow whole. Do not chew, crush, or break.     DULoxetine (CYMBALTA) 30 MG DR capsule TAKE 1 CAPSULE BY MOUTH TWO TIMES DAILY     cetirizine (ZYRTEC) 10 MG tablet Take 1 tablet (10 mg total) by mouth daily     ascorbic acid (VITAMIN C) 100 MG tablet Take 5 tablets (500 mg total) by mouth daily     lamoTRIgine (LAMICTAL) 150 MG tablet Take 1 tablet (150 mg total) by mouth daily     busPIRone (BUSPAR) 30 MG tablet Take 1 tablet (30 mg total) by mouth 2 times daily     insulin lispro 100 UNIT/ML injection vial Inject as directed up to MDD 100 units     topiramate (TOPAMAX) 100 MG tablet Take 1 tablet (100 mg total) by mouth 2 times daily     pantoprazole (PROTONIX) 40 MG EC tablet Take 1 tablet (40 mg total) by mouth daily SWALLOW WHOLE. DO NOT CRUSH, BREAK, OR CHEW.     fluticasone (FLONASE) 50 MCG/ACT nasal spray SPRAY 1 SPRAY INTO EACH NOSTRIL ONE TIME DAILY      dulaglutide (TRULICITY) 1.5 MZO/1.0RUinjection pen Inject 0.5 mLs (1.5 mg total) into the skin every 7 days     atorvastatin (LIPITOR) 40 MG tablet Take 1 tablet (40 mg total) by mouth daily (with dinner)     metoprolol (TOPROL-XL) 25 MG 24 hr tablet Take by mouth daily    06/10/2016: Received from: External Pharmacy    midodrine (PROAMATINE) 10 MG tablet Take 1 tablet (10 mg total) by mouth 3 times daily     Continuous Blood Gluc Sensor (DEXCOM G6 SENSOR) MISC By 1 Device no specified route as needed     Continuous Blood Gluc Transmit (DEXCOM G6 TRANSMITTER) MISC By 1 Device no specified route as needed     Continuous Blood Gluc Receiver (DEXCOM G6 RECEIVER) DEVI By 1 Device no specified route as needed     Insulin Disposable Pump (OMNIPOD DASH 5 PACK) MISC By 1 Device no specified route as needed Change every 2 days = 45 pods for 90 days supply = 9 (5packs)     ascorbic acid (VITAMIN C) 500 MG tablet Take 1 tablet (500 mg total) by mouth daily  Alcohol Swabs (SM ALCOHOL PREP) 70 % PADS USE TWO TIMES DAILY TO CHECK BLOOD GLUCOSE     clonazePAM (KLONOPIN) 0.5 MG tablet Take 1 tablet (0.5 mg total) by mouth daily as needed Max daily dose: 0.5 mg     traMADol (ULTRAM) 50 MG tablet Take 1 tablet (50 mg total) by mouth every 8 hours as needed (for head pain and abd pain) Max daily dose: 150 mg     famciclovir (FAMVIR) 500 MG tablet Take 1 tablet (500 mg total) by mouth 3 times daily as needed     zinc oxide 20 % ointment Apply topically as needed for Dry Skin     clotrimazole (LOTRIMIN) 1 % cream Apply topically 2 times daily     glucagon (BAQSIMI) 3 MG/DOSE nasal powder Inhale one dose (3 mg) into one nostril once as needed for low blood sugar. If no response in 15 min, inhale second dose from new device     promethazine (PHENERGAN) 25 MG tablet Take 1 tablet (25 mg total) by mouth every 4-6 hours as needed     insulin syringe-needle U-100 (BD ULTRAFINE) 31G X 5/16" 0.3 ML HALF-UNIT Use 3 times a  day as instructed.     VENTOLIN HFA 108 (90 Base) MCG/ACT inhaler INHALE 1 TO 2 PUFFS BY MOUTH INTO THE LUNGS EVERY 6 HOURS AS NEEDED FOR WHEEZING. SHAKE WELL BEFORE EACH USE.     Continuous Blood Gluc Receiver (DEXCOM G6 RECEIVER) DEVI By 1 Device no specified route as needed     diphenoxylate-atropine (LOMOTIL) 2.5-0.025 MG per tablet Take 1-2 tablets by mouth 4 times daily as needed for Diarrhea   Max daily dose: 8 tablets Code D     blood glucose test strip Test  8 times a day.   Brand name of strips Contour NEXT test strips for linked omnipod pump     lancets (BAYER MICROLET) Use   8  times per day as instructed for blood glucose testing.     azelastine (OPTIVAR) 0.05 % ophthalmic solution Place 1 drop into both eyes 2 times daily     SUMAtriptan refill (IMITREX STATDOSE) 6 MG/0.5ML injection INJECT 0.5ML UNDER THE SKIN ONCE AS NEEDED FOR MIGRAINE. MAY REPEAT DOSE ONCE AFTER 1 HOUR     glucagon (GLUCAGEN) 1 MG injection kit Inject 1 mg into the skin once as needed for Low blood sugar   Discard any unused portion.     SUMAtriptan (IMITREX) 50 MG tablet TAKE 1 TABLET BY MOUTH AS NEEDED FOR MIGRAINE, TAKE AT ONSET OF HEADACHE. MAY REPEAT ONCE IN 2 HOURS     SUMAVEL DOSEPRO 6 MG/0.5ML needle-free injection INJECT 0.5MLS (6MG TOTAL) UNDER THE SKIN AS NEEDED FOR MIGRAINE MAY REPEAT ONCE AFTER 1 HOUR IF NEEDED 12/16/2016: Received from: External Pharmacy    Non-System Medication The above patient is followed in our clinic and cannot resume work permanently.        ROS  +nightmares      OBJECTIVE  There were no vitals filed for this visit.  There is no height or weight on file to calculate BMI.      General: well-appearing Caucasian female, pleasant & conversant, in NAD   Psych: AAOx3, depressed affect and mood. Labile and tearful. No psychomotor agitation or slowing. Insight and judgement intact.           ASSESSMENT & PLAN  1. Anxiety  Recommended against further dose increase of seroquel given metabolic SE  and her underlying  DM. 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 She identified concerns about her father's current leg swelling and other chronic conditions which sound acutely worse and are triggering her to fear if he would need to return to the hospital. I offered to see him (as I am his PCP as well) to help his/her concerns.        Due to pandemic event, visit performed via:        Video   Location of Patient: home  Location of Telemedicine Provider: clinical office   Other participants in telemedicine encounter and roles: n/a    Consent was obtained from the patient to complete this video visit; including the potential for financial liability.    This visit was performed during a pandemic event and the vital signs including BMI and physical exam are limited or missing due to the patient's location.         2:31 - 2:48 PM          [name], MD  [facility]  08/18/2018  2:31 PM        ______________________

## 2018-08-19 ENCOUNTER — Encounter: Payer: Self-pay | Admitting: Registered Nurse

## 2018-08-20 ENCOUNTER — Other Ambulatory Visit: Payer: Self-pay | Admitting: Primary Care

## 2018-08-20 ENCOUNTER — Other Ambulatory Visit: Payer: Self-pay | Admitting: Pulmonary and Critical Care Medicine

## 2018-08-20 DIAGNOSIS — Z760 Encounter for issue of repeat prescription: Secondary | ICD-10-CM

## 2018-08-20 MED ORDER — TRAMADOL HCL 50 MG PO TABS *I*
50.0000 mg | ORAL_TABLET | Freq: Three times a day (TID) | ORAL | 0 refills | Status: DC | PRN
Start: 2018-08-20 — End: 2018-10-13

## 2018-08-20 NOTE — Telephone Encounter (Signed)
[name] - 7 Prescriptions  Confidential Drug Report  Search Terms: Daleigh Pollinger, 1976/04/24   Search Date: 08/20/2018 12:44:38 PM   Searching on behalf of: [id number] - [name]   The Drug Utilization Report below displays all of the controlled substance prescriptions, if any, that your patient has filled in the last twelve months. The information displayed on this report is compiled from pharmacy submissions to the Department, and accurately reflects the information as submitted by the pharmacies.  This report was requested by: Montez Morita   Reference #: [id number]   Others' Prescriptions  Patient Name: Armina Galloway   Birth Date: 22-May-1975   Address: [place], [place] [address]   Sex: Female   Rx Written Rx Dispensed Drug Quantity Days Supply Prescriber Name Payment Method Dispenser   07/22/2018 07/22/2018 clonazepam 0.5 mg tablet  28 28 [name], Darnell Level (MD) [place]. #02   05/27/2018 06/02/2018 tramadol hcl 50 mg tablet  42 14 [place], [place]. #02   03/25/2018 03/25/2018 tramadol hcl 50 mg tablet  42 14 [place], [place]. #02   01/19/2018 01/22/2018 tramadol hcl 50 mg tablet  42 14 [place], [place]. #02   01/20/2018 01/22/2018 diphenoxylate-atropine 2.5-0.025 mg tablet  240 30 Telford Nab A ([facility]) [employer] [place]. #02   12/13/2017 12/13/2017 hydrocodone-acetaminophen 5-325 mg tablet  12 2 [place], [place], [place]. #02   11/04/2017 11/12/2017 tramadol hcl 50 mg tablet  31 [place], [place]. #02   * - Drugs marked with an asterisk are compound drugs. If the compound drug is made up of more than one controlled substance, then each controlled substance will be a separate row in the table.

## 2018-08-20 NOTE — Telephone Encounter (Signed)
Last appt: 05/14/2018     Next appt:  08/20/2018        Recent Lab Values 01/04/2018 12/17/2017 10/17/2017 10/17/2017 03/31/2017 10/29/2016 10/17/2016   EXP DATE 9/20 02/02/2018 11-03-18 08-04-18 04/04/18 04/04/2017 08/03/17   [facility] - - - - - - -

## 2018-08-23 ENCOUNTER — Other Ambulatory Visit: Payer: Self-pay | Admitting: Primary Care

## 2018-08-23 DIAGNOSIS — S31109A Unspecified open wound of abdominal wall, unspecified quadrant without penetration into peritoneal cavity, initial encounter: Secondary | ICD-10-CM

## 2018-08-23 DIAGNOSIS — F419 Anxiety disorder, unspecified: Secondary | ICD-10-CM

## 2018-08-24 MED ORDER — CLONAZEPAM 0.5 MG PO TABS *I*
0.5000 mg | ORAL_TABLET | Freq: Every day | ORAL | 0 refills | Status: DC | PRN
Start: 2018-08-24 — End: 2019-02-23

## 2018-08-24 MED ORDER — CLOTRIMAZOLE 1 % EX CREA *I*
TOPICAL_CREAM | Freq: Two times a day (BID) | CUTANEOUS | 3 refills | Status: DC
Start: 2018-08-24 — End: 2020-04-06

## 2018-08-24 MED ORDER — ZINC OXIDE 20 % EX OINT *I*
TOPICAL_OINTMENT | CUTANEOUS | 3 refills | Status: AC | PRN
Start: 2018-08-24 — End: ?

## 2018-08-24 NOTE — Telephone Encounter (Signed)
[name] - 8 Prescriptions  Confidential Drug Report  Search Terms: Niamh Rada, 21-Jun-1975   Search Date: 08/24/2018 14:00:07 PM   Searching on behalf of: [id number] - [name]   The Drug Utilization Report below displays all of the controlled substance prescriptions, if any, that your patient has filled in the last twelve months. The information displayed on this report is compiled from pharmacy submissions to the Department, and accurately reflects the information as submitted by the pharmacies.  This report was requested by: Montez Morita   Reference #: [id number]   Others' Prescriptions  Patient Name: Kaylee Harris   Birth Date: 1976/05/06   Address: [place] [address]   Sex: Female   Rx Written Rx Dispensed Drug Quantity Days Supply Prescriber Name Payment Method Dispenser   08/20/2018 08/21/2018 tramadol hcl 50 mg tablet  42 14 [place], [place]. #19   07/22/2018 07/22/2018 clonazepam 0.5 mg tablet  28 28 [name], Darnell Level (MD) [place]. #02   05/27/2018 06/02/2018 tramadol hcl 50 mg tablet  42 14 [place], [place]. #02   03/25/2018 03/25/2018 tramadol hcl 50 mg tablet  42 14 [place], [place]. #02   01/19/2018 01/22/2018 tramadol hcl 50 mg tablet  42 14 [place], [place]. #02   01/20/2018 01/22/2018 diphenoxylate-atropine 2.5-0.025 mg tablet  240 30 [place] Nab A ([facility]) [employer] [place]. #02   12/13/2017 12/13/2017 hydrocodone-acetaminophen 5-325 mg tablet  12 2 Haslinger, [place], [place]. #02   11/04/2017 11/12/2017 tramadol hcl 50 mg tablet  24 [place], [place]. #02   * - Drugs marked with an asterisk are compound drugs. If the compound drug is made up of more than one controlled substance, then each controlled substance will be a separate  row in the table.

## 2018-08-25 ENCOUNTER — Encounter: Payer: Self-pay | Admitting: Psychiatry

## 2018-08-25 ENCOUNTER — Other Ambulatory Visit: Payer: Self-pay | Admitting: Psychiatry

## 2018-08-25 DIAGNOSIS — E1165 Type 2 diabetes mellitus with hyperglycemia: Secondary | ICD-10-CM

## 2018-08-25 MED ORDER — OMNIPOD DASH PODS (GEN 4) MISC
1.0000 | 3 refills | Status: DC | PRN
Start: 2018-08-25 — End: 2019-12-15

## 2018-08-26 ENCOUNTER — Other Ambulatory Visit: Payer: Self-pay | Admitting: Primary Care

## 2018-08-26 MED ORDER — QUETIAPINE FUMARATE 50 MG PO TB24 *I*
50.0000 mg | ORAL_TABLET | Freq: Every evening | ORAL | 3 refills | Status: DC
Start: 2018-08-26 — End: 2019-02-14

## 2018-08-26 NOTE — Telephone Encounter (Signed)
Last appt: 05/14/2018     Next appt:  11/10/2018        Recent Lab Values 01/04/2018 12/17/2017 10/17/2017 10/17/2017 03/31/2017 10/29/2016 10/17/2016   EXP DATE 9/20 02/02/2018 11-03-18 08-04-18 04/04/18 04/04/2017 08/03/17   [facility] - - - - - - -

## 2018-08-31 ENCOUNTER — Ambulatory Visit: Payer: Medicare (Managed Care)

## 2018-08-31 DIAGNOSIS — F422 Mixed obsessional thoughts and acts: Secondary | ICD-10-CM

## 2018-08-31 DIAGNOSIS — F603 Borderline personality disorder: Secondary | ICD-10-CM

## 2018-08-31 DIAGNOSIS — F331 Major depressive disorder, recurrent, moderate: Secondary | ICD-10-CM

## 2018-08-31 DIAGNOSIS — F4312 Post-traumatic stress disorder, chronic: Secondary | ICD-10-CM

## 2018-08-31 NOTE — Progress Notes (Signed)
Behavioral Health Progress Note   Patient encounter was performed via      [x]  Real time audiovisual    []   Telephone    Patient located at Home    Provider located at:   [x]  Home office  []  Clinical office   []   Administrative office         Other participants in this encounter and roles: N/A    Length of Session: 40 minutes    Contact Type:  Location: Telemedicine    Telephone Contact Covid-19    Problem(s)/Goals Addressed from Treatment Plan:    Problem 1:   Treatment Problem #1 09/03/2017   Patient Identified Problem OCD       Goal for this problem:    Treatment Goal #1 09/03/2017   Patient Identified Goal Work to reduce obsessional thoughts and actions       Progress towards this goal: No change. Comment: Patient reports continued anxiety.    Mental Status Exam:  APPEARANCE: Appears stated age, Casual  ATTITUDE TOWARD INTERVIEWER: Cooperative  MOTOR ACTIVITY: WNL (within normal limits)  EYE CONTACT: Direct  SPEECH: Normal rate and tone  AFFECT: Decreased Range  MOOD: Anxious and Depressed  THOUGHT PROCESS: Circumstantial  THOUGHT CONTENT: Negative Rumination  PERCEPTION: No evidence of hallucinations  CURRENT SUICIDAL IDEATION: patient denies  CURRENT HOMICIDAL IDEATION: Patient denies  ORIENTATION: Alert and Oriented X 3.  CONCENTRATION: Good  MEMORY:   Recent: intact   Remote: intact  COGNITIVE FUNCTION: Average intelligence  JUDGMENT: Intact  IMPULSE CONTROL: Fair  INSIGHT: Fair    Risk Assessment:  ASSESSMENT OF RISK FOR SUICIDAL BEHAVIOR  Changes in risk for suicide from baseline Formulation of Risk and/or previous intake, including newly identified risk, if any: none  Violence risk was assessed and No Change noted from baseline formulation of risk and/or previous assessment.    Session Content:: Patient reports continued anxiety. She said "so much" had happened. Patient told Probation officer that her father passed out twice and her mother called her to come over, checked her father's head, call an ambulance, contact  his doctor who said they had to call the cardiologist, which she did. Patient said that she had to do "everything" because "no one" else would. She said that she was freaking out, but had to keep her cool. Patient said that her father is back home as of today, but now she is worried because of Covid-19. Writer validated patient's thoughts and feelings. Patient said that the medication change seems to be helping but "the problems are still there". Writer and patient discussed ways in which patient can set boundaries. Patient said that her parents are doing their own shopping now. She said that she isn't yelling at [facility] to wash her hands all the time either. Patient said that she is still wiping everything down. She said that she is coloring and tried meditating twice. Writer commended patient on her progress. Patient said that she is still binge eating sweets. Writer encouraged patient to utilize urge surfing skills and Mindful eating.      Visit Diagnosis:      ICD-10-CM ICD-9-CM   1. Mixed obsessional thoughts and acts F42.2 300.3   2. Borderline personality disorder F60.3 301.83   3. MDD (major depressive disorder), recurrent episode, moderate F33.1 296.32   4. Chronic post-traumatic stress disorder (PTSD) F43.12 309.81       Interventions:  Provided therapeutic support during discussion of distressing/traumatic events and/or symptoms  Supportive Psychotherapy  Taught/practiced coping  skills (specify skills used):  Observe limits, set boundaries, activities, urge surfing, Mindfulness    Current Treatment Plan   Created/Updated On 09/03/2017   Next Treatment Plan Due 12/03/2017     Consent was previously obtained from the patient to complete this Video consult; including the potential for financial liability.    Time spent on the  Video with patient: 25-39 minutes    Length of time compiling the report:15 minutes    Plan:  Psychotherapy continues as described in care plan; plan remains the same.    NEXT APPT:  09/14/18.      Tresa Res, LCSW-R

## 2018-08-31 NOTE — BH Treatment Plan (Addendum)
[facility]     Date of Plan:   Created/Updated On 08/31/2018   FROM 08/31/2018      Created/Updated On 08/31/2018   Next Treatment Plan Due 11/29/2018       Diagnostic Impression    ICD-10-CM ICD-9-CM   1. Mixed obsessional thoughts and acts F42.2 300.3   2. Borderline personality disorder F60.3 301.83   3. MDD (major depressive disorder), recurrent episode, moderate F33.1 296.32   4. Chronic post-traumatic stress disorder (PTSD) F43.12 309.81       Strengths  Strengths derived from the assessment include: Patient is hard working, has supports.    Problem Areas  *At least one problem must be targeted toward risk reduction if Formulation of Risk or any other previous exam indicated special monitoring or intervention for suicide and/or violence risk indicated.    PROBLEM AREAS (choose and describe relevant):  THOUGHT: Negative  MOOD:Depressed and anxious  BEHAVIOR: Reactive and avoidant  ECONOMIC:SSDI  ________________________________________________________________  Treatment Problem #1 08/31/2018   Patient Identified Problem OCD        Treatment Goal #1 08/31/2018   Patient Identified Goal Utilize CBT to reduce behaviors       The rationale for addressing this problem is that resolving it will (select all that apply):  Reduce symptoms of disorder, Reduce functional impairment associated with disorder, Decrease likelihood of hospitalization, Facilitate transfer skills learned in therapy to everday life, Is a key motivational factor for the patient's participation in treatment, Reduce risk for suicide* and Reduce risk for violence*      Progress toward goal(s): N/A - Initial plan Patient is not on the [place]. Patient not available to sign treatment plan due to Covid-19.    1 a. Measurable Objectives : Patient will make and keep regular individual therapy appointments.   Date established: 05/31/14   Target date: 11/29/18   Attained or Revised? continue    1 b. Measurable Objectives :  Patient will continue to learn and utilize skills to manage affect.   Date established: 08/31/18   Target date: 11/29/18   Attained or Revised? continue    ______________________________________________________________________      Plan  TREATMENT MODALITIES:  Individual psychotherapy for 30-60 min Q 1-3 weeks with Tresa Res, LCSW.    DISCHARGE CRITERIA for this treatment setting: Patient reports a reduction in symptoms as evidenced by PHQ-9 and GAD-7 scores.    Clinician's name: Tresa Res, LCSW    Psychiatrist's Name: Donny Pique, MD      Patient/Family Statement  PATIENT/FAMILY STATEMENT:  Obtain patient and family input into the treatment plan, including areas of agreement / disagreement.  Obtain patient's signature - if not possible, briefly describe the reason.     Patient Comments:          I HAVE PARTICIPATED IN THE DEVELOPMENT OF THIS TREATMENT PLAN AND I AGREE WITH ITS CONTENTS:       Patient Signature: _______________________________________________________    Date: ______________________________

## 2018-09-02 NOTE — Progress Notes (Signed)
The Probation officer called and spoke to [place] on the phone.  Kaylee Harris reports that she is doing well.  That she is becoming obsessive about washing her hands due to the COVID 19 Virus.  She reports that her anxiety has gotten worse.  Kaylee Harris reports that she has recently had a medication change to decrease her anxiety.  She reports that she has only been on it a week and is unsure effective it is working.  The writer encouraged Kaylee Harris to journal her symptoms to help her with communicating the medications effects with her PCP who prescribes the medications.    Kaylee Harris reports that she is now seeing , therapist, Tresa Res, again to talk about her feelings.  She reports that she is happy with this change.    Kaylee Harris reports that she received information in the mail regarding a [place] and she was confused about what she needed to do regarding the plans and what they mean.  The writer referred Kaylee Harris to [employer], Kaylee Harris, who went to [name]'s home to help set her up with a [place].  Kaylee Harris reports that she has the phone # of Kaylee Harris.  Kaylee Harris reports that she is in need of food.  The Probation officer gave Kaylee Harris the phone # of Kaylee Harris at [phone] to call for emergency assistance.    Greg reports needing nothing  else at this time.  The writer checked medical records and reminded Kaylee Harris of her appointments on the 08/17/18 at 3pm with Tresa Res and on 08/18/18 at 2:20pm with [name].  The writer reminded Kaylan that most appointments are now over the phone due to the [place] 19 pandemic.  The writer made sure that Kaylee Harris was practicing Social Distancing and told her to call the writer if she needed help with anything.  The writer called Kaylee Harris to see how she is doing and to address any needs she has at this time.  Kaylee Harris reports that she has been very stressed lately due to her dad going back into the hospital and her needing to help them with taking them to [facility] to sign up  for a spend down and teaching them how to do that.  Kaylee Harris reports that her anxiety has been through the roof, that her brother and sister are not helping and that she seems to be carrying all the burden of taking care of her parents.  Kaylee Harris reports that her parents need a personal care aide to come in and help her dad not just after he gets out of the hospital but for a longer period of time, because they need the assistance.  The Probation officer inquired about their [facility].  [name] reports that they have a care manager named Vaughan Basta through [facility].  The Probation officer and [name] together called to speak to the care manager, Vaughan Basta to ask her to help get personal care aide services for her parents due to her father currently being in the Hospital.  The receptionist at Dr. Tawanna Sat office asked her parents to wait until his follow up appointment on May 4th to talk to the doctor and to talk to the Doctor about the situation then.   Kaylee Harris reports that the writer's assistance helps her out a lot due to her high anxiety.  Kaylee Harris reports needing nothing else at this time.

## 2018-09-03 ENCOUNTER — Telehealth: Payer: Self-pay | Admitting: Psychiatry

## 2018-09-03 NOTE — Telephone Encounter (Signed)
PA submitted for Omnipod Dash Pods via CMM. Sent to review.

## 2018-09-04 NOTE — Telephone Encounter (Signed)
PA for Omnipod approved until 09/03/2019

## 2018-09-08 ENCOUNTER — Encounter: Payer: Self-pay | Admitting: Psychiatry

## 2018-09-14 ENCOUNTER — Ambulatory Visit: Payer: Medicare (Managed Care) | Attending: Psychiatry

## 2018-09-14 DIAGNOSIS — F4312 Post-traumatic stress disorder, chronic: Secondary | ICD-10-CM

## 2018-09-14 DIAGNOSIS — F422 Mixed obsessional thoughts and acts: Secondary | ICD-10-CM

## 2018-09-14 DIAGNOSIS — F603 Borderline personality disorder: Secondary | ICD-10-CM

## 2018-09-14 DIAGNOSIS — F331 Major depressive disorder, recurrent, moderate: Secondary | ICD-10-CM

## 2018-09-14 NOTE — Progress Notes (Signed)
Behavioral Health Progress Note   Patient encounter was performed via      [x]  Real time audiovisual    []   Telephone    Patient located at Home    Provider located at:   [x]  Home office  []  Clinical office   []   Administrative office         Other participants in this encounter and roles:  N/A    Length of Session: 45 minutes    Contact Type:  Location: Telemedicine - Covid-19    Telephone Contact     Problem(s)/Goals Addressed from Treatment Plan:    Problem 1:   Treatment Problem #1 08/31/2018   Patient Identified Problem OCD       Goal for this problem:    Treatment Goal #1 08/31/2018   Patient Identified Goal Utilize CBT to reduce behaviors       Progress towards this goal: Problem resolving. Comment: Patient reports greater mood stability.    Mental Status Exam:  APPEARANCE: Appears stated age, Casual  ATTITUDE TOWARD INTERVIEWER: Cooperative  MOTOR ACTIVITY: WNL (within normal limits)  EYE CONTACT: Direct  SPEECH: Normal rate and tone  AFFECT: Full Range  MOOD: Anxious and Depressed  THOUGHT PROCESS: Circumstantial  THOUGHT CONTENT: Negative Rumination  PERCEPTION: No evidence of hallucinations  CURRENT SUICIDAL IDEATION: patient denies  CURRENT HOMICIDAL IDEATION: Patient denies  ORIENTATION: Alert and Oriented X 3.  CONCENTRATION: Good  MEMORY:   Recent: intact   Remote: intact  COGNITIVE FUNCTION: Average intelligence  JUDGMENT: Intact  IMPULSE CONTROL: Fair  INSIGHT: Fair    Risk Assessment:  ASSESSMENT OF RISK FOR SUICIDAL BEHAVIOR  Changes in risk for suicide from baseline Formulation of Risk and/or previous intake, including newly identified risk, if any: none  Violence risk was assessed and No Change noted from baseline formulation of risk and/or previous assessment.    Session Content:: Patient reports greater mood stability. She said that she thinks the medication has helped with this. Patient told Probation officer that her father was in the hospital again for 4 days and it was pretty bad this time. She said  that his foot swelled so much it turned purple. Patient talked about her annoyance with her brother due to his not believing that they need to wear masks. She said that her brother and other family members text, message, and call her every day to talk about her father and her anxiety level goes up. Writer validated patient's thoughts and feelings. Writer and patient discussed patient setting boundaries with family. Patient received four phone calls from her sister during her session. Patient said that she specifically told her sister that she had this appointment. She said that she had already talked to her sister today in person. Patient said that she now is staring to worry that something might be wrong with her father. Writer and patient discussed how patient can set a boundary with her sister. Writer encouraged patient to work on her checking behavior by not calling her sister back for an hour after her session. Patient said that she will start setting boundaries with her sister today. She said that she is better at using I feel statements. Writer commended patient on her progress.      Visit Diagnosis:      ICD-10-CM ICD-9-CM   1. Mixed obsessional thoughts and acts F42.2 300.3   2. Borderline personality disorder F60.3 301.83   3. MDD (major depressive disorder), recurrent episode, moderate F33.1 296.32   4. Chronic  post-traumatic stress disorder (PTSD) F43.12 309.81       Interventions:  Supportive Psychotherapy  Taught/practiced coping skills (specify skills used):  observe limits, set boundaries, reduce checking behavior.    Current Treatment Plan   Created/Updated On 08/31/2018   Next Treatment Plan Due 11/29/2018     Consent was previously obtained from the patient to complete this Video consult; including the potential for financial liability.    Time spent on the  Video with patient: 40+ minutes    Length of time compiling the report:15 minutes    Plan:  Psychotherapy continues as described in care plan;  plan remains the same.    NEXT APPT: 10/05/18.      Tresa Res, LCSW-R

## 2018-09-18 ENCOUNTER — Telehealth: Payer: Self-pay

## 2018-09-18 NOTE — Telephone Encounter (Signed)
Received DOH forms, placed them in nurses bin for review. Patient was last "seen" 4/14 by JM

## 2018-09-18 NOTE — Telephone Encounter (Signed)
Nursing reveiwed and put in [place], NP inbox for completion

## 2018-09-19 ENCOUNTER — Other Ambulatory Visit: Payer: Self-pay | Admitting: Pulmonary and Critical Care Medicine

## 2018-09-21 NOTE — Telephone Encounter (Signed)
Please call patient and let her know that I have taken a look at the paperwork and based on her last office visit, I can complete the paperwork.  However, I am unsure that she will get what she wants from it based on that visit.  I would advise that she schedule a zoom visit so we can discuss in detail to see what she really needs to get out of this.  Thanks.

## 2018-09-21 NOTE — Telephone Encounter (Signed)
Left message for patient to return call.

## 2018-09-21 NOTE — Telephone Encounter (Signed)
Spoke with patient and Zoom visit scheduled with NP

## 2018-09-21 NOTE — Telephone Encounter (Signed)
Patient returned call for nurse. Please advise

## 2018-09-21 NOTE — Telephone Encounter (Signed)
Last appt: 05/14/2018     Next appt:  11/10/2018        Recent Lab Values 01/04/2018 12/17/2017 10/17/2017 10/17/2017 03/31/2017 10/29/2016 10/17/2016   EXP DATE 9/20 02/02/2018 11-03-18 08-04-18 04/04/18 04/04/2017 08/03/17   [facility] - - - - - - -

## 2018-09-22 ENCOUNTER — Ambulatory Visit: Payer: Medicare (Managed Care) | Admitting: Primary Care

## 2018-09-22 DIAGNOSIS — K580 Irritable bowel syndrome with diarrhea: Secondary | ICD-10-CM

## 2018-09-22 DIAGNOSIS — R000 Tachycardia, unspecified: Secondary | ICD-10-CM

## 2018-09-22 DIAGNOSIS — F419 Anxiety disorder, unspecified: Secondary | ICD-10-CM

## 2018-09-22 DIAGNOSIS — G90A Postural orthostatic tachycardia syndrome (POTS): Secondary | ICD-10-CM

## 2018-09-22 DIAGNOSIS — E1165 Type 2 diabetes mellitus with hyperglycemia: Secondary | ICD-10-CM

## 2018-09-22 DIAGNOSIS — I498 Other specified cardiac arrhythmias: Secondary | ICD-10-CM

## 2018-09-22 DIAGNOSIS — I951 Orthostatic hypotension: Secondary | ICD-10-CM

## 2018-09-22 MED ORDER — INCONTINENCE SUPPLY DISPOSABLE MISC *A*
3 refills | Status: DC
Start: 2018-09-22 — End: 2021-01-22

## 2018-09-22 NOTE — Progress Notes (Signed)
Kaylee    SHACOLA Harris is a 43 y.o. female who presents, for CDPAS forms    Due to pandemic event, visit performed via:        Video   Location of Patient: home  Location of Telemedicine Provider: clinical office   Other participants in telemedicine encounter and roles: n/a    Consent was obtained from the patient to complete this video visit; including the potential for financial liability.    This visit was performed during a pandemic event and the vital signs including BMI and physical exam are limited or missing due to the patient's location.      This visit is to complete CDPAS forms for the patient.  These forms are new for her and clarification was obtained as to what she needs.  Patient needs to be able to receive transportation to and from her dr appointments when needed.  She also needs help with housekeeping tasks specifically laundry as it is in the basement.  Patient also feels that if she could get ready made meals delivered this would be helpful when her spouse is not home.       I personally reviewed the patient's medications, allergies, medical history and updated as appropriate.      Current Outpatient Medications   Medication Sig    incontinence supply disposable Order Description:  Wear as needed for stool leakage    pantoprazole (PROTONIX) 40 MG EC tablet TAKE 1 TABLET BY MOUTH EVERY DAY, SWALLOW WHOLE. DO NOT BREAK, CHEW OR CRUSH    QUEtiapine (SEROQUEL XR) 50 MG 24 hr tablet Take 1 tablet (50 mg total) by mouth nightly Swallow whole. Do not chew, crush, or break.    Insulin Disposable Pump (OMNIPOD DASH 5 PACK) MISC By 1 Device no specified route as needed Change every 2 days = 45 pods for 90 days supply = 9 (5packs)    zinc oxide 20 % ointment Apply topically as needed for Dry Skin    clotrimazole (LOTRIMIN) 1 % cream Apply topically 2 times daily    clonazePAM (KLONOPIN) 0.5 MG tablet Take 1 tablet (0.5 mg total) by mouth daily as needed Max daily  dose: 0.5 mg    TRULICITY 1.5 TD/3.2KG injection pen INJECT THE CONTENTS OF ONE PEN (0.5 ML) INTO THE SKIN EVERY 7 DAYS    traMADol (ULTRAM) 50 MG tablet Take 1 tablet (50 mg total) by mouth every 8 hours as needed (for head pain and abd pain) Max daily dose: 150 mg    Continuous Blood Gluc Sensor (DEXCOM G6 SENSOR) MISC By 1 Device no specified route as needed    Continuous Blood Gluc Transmit (DEXCOM G6 TRANSMITTER) MISC By 1 Device no specified route as needed    Continuous Blood Gluc Receiver (DEXCOM G6 RECEIVER) DEVI By 1 Device no specified route as needed    ascorbic acid (VITAMIN C) 500 MG tablet Take 1 tablet (500 mg total) by mouth daily    Alcohol Swabs (SM ALCOHOL PREP) 70 % PADS USE TWO TIMES DAILY TO CHECK BLOOD GLUCOSE    DULoxetine (CYMBALTA) 30 MG DR capsule TAKE 1 CAPSULE BY MOUTH TWO TIMES DAILY    cetirizine (ZYRTEC) 10 MG tablet Take 1 tablet (10 mg total) by mouth daily    ascorbic acid (VITAMIN C) 100 MG tablet Take 5 tablets (500 mg total) by mouth daily    lamoTRIgine (LAMICTAL) 150 MG tablet Take 1 tablet (150 mg total)  by mouth daily    busPIRone (BUSPAR) 30 MG tablet Take 1 tablet (30 mg total) by mouth 2 times daily    insulin lispro 100 UNIT/ML injection vial Inject as directed up to MDD 100 units    famciclovir (FAMVIR) 500 MG tablet Take 1 tablet (500 mg total) by mouth 3 times daily as needed    topiramate (TOPAMAX) 100 MG tablet Take 1 tablet (100 mg total) by mouth 2 times daily    glucagon (BAQSIMI) 3 MG/DOSE nasal powder Inhale one dose (3 mg) into one nostril once as needed for low blood sugar. If no response in 15 min, inhale second dose from new device    promethazine (PHENERGAN) 25 MG tablet Take 1 tablet (25 mg total) by mouth every 4-6 hours as needed    fluticasone (FLONASE) 50 MCG/ACT nasal spray SPRAY 1 SPRAY INTO EACH NOSTRIL ONE TIME DAILY    insulin syringe-needle U-100 (BD ULTRAFINE) 31G X 5/16" 0.3 ML HALF-UNIT Use 3 times a day as instructed.     VENTOLIN HFA 108 (90 Base) MCG/ACT inhaler INHALE 1 TO 2 PUFFS BY MOUTH INTO THE LUNGS EVERY 6 HOURS AS NEEDED FOR WHEEZING. SHAKE WELL BEFORE EACH USE.    Continuous Blood Gluc Receiver (DEXCOM G6 RECEIVER) DEVI By 1 Device no specified route as needed    diphenoxylate-atropine (LOMOTIL) 2.5-0.025 MG per tablet Take 1-2 tablets by mouth 4 times daily as needed for Diarrhea   Max daily dose: 8 tablets Code D    blood glucose test strip Test  8 times a day.   Brand name of strips Contour NEXT test strips for linked omnipod pump    lancets (BAYER MICROLET) Use   8  times per day as instructed for blood glucose testing.    azelastine (OPTIVAR) 0.05 % ophthalmic solution Place 1 drop into both eyes 2 times daily    SUMAtriptan refill (IMITREX STATDOSE) 6 MG/0.5ML injection INJECT 0.5ML UNDER THE SKIN ONCE AS NEEDED FOR MIGRAINE. MAY REPEAT DOSE ONCE AFTER 1 HOUR    glucagon (GLUCAGEN) 1 MG injection kit Inject 1 mg into the skin once as needed for Low blood sugar   Discard any unused portion.    SUMAtriptan (IMITREX) 50 MG tablet TAKE 1 TABLET BY MOUTH AS NEEDED FOR MIGRAINE, TAKE AT ONSET OF HEADACHE. MAY REPEAT ONCE IN 2 HOURS    SUMAVEL DOSEPRO 6 MG/0.5ML needle-free injection INJECT 0.5MLS (6MG TOTAL) UNDER THE SKIN AS NEEDED FOR MIGRAINE MAY REPEAT ONCE AFTER 1 HOUR IF NEEDED    atorvastatin (LIPITOR) 40 MG tablet Take 1 tablet (40 mg total) by mouth daily (with dinner)    metoprolol (TOPROL-XL) 25 MG 24 hr tablet Take by mouth daily       midodrine (PROAMATINE) 10 MG tablet Take 1 tablet (10 mg total) by mouth 3 times daily    Non-System Medication The above patient is followed in our clinic and cannot resume work permanently.       ROS  See pertinent HPI      OBJECTIVE  There were no vitals filed for this visit.  There is no height or weight on file to calculate BMI.      GENERAL: A&Ox3, pleasant, cooperative, well groomed, appropriately dressed, actively engaged in encounter, NAD.  EYES: PERRLA.  EOMI. Conjunctiva pink, without swelling or exudate. Lids normal.   LUNGS: Normal resp effort. Appears to have full and symmetric lung expansion.  PSYCH: AAOx3, normal affect and mood. Insight and judgement intact.  ASSESSMENT & PLAN    1. Uncontrolled type 2 diabetes mellitus with hyperglycemia  2. POTS (postural orthostatic tachycardia syndrome)  3. Irritable bowel syndrome with diarrhea    - incontinence supply disposable; Order Description:  Wear as needed for stool leakage  Dispense: 100 each; Refill: 3    4. Anxiety    CDPAS forms completed and signed.      Follow-up: Return if symptoms worsen or fail to improve.      Patient verbalized understanding of information presented,and confirmed agreement with current plan of care.  Reviewed risks, benefits, and administration of prescribed/refilled medications. Precautions reviewed.      Graceann Congress, FNP-C  09/23/2018  7:34 AM

## 2018-09-23 ENCOUNTER — Encounter: Payer: Self-pay | Admitting: Gastroenterology

## 2018-09-24 NOTE — Progress Notes (Signed)
The writer called Kaylee Harris to review the care plan and crisis plan with her.  Kaylee Harris reports no changes to the crisis plan.  Kaylee Harris reports no changes to the care plan.  The writer reviewed the care plan with Kaylee Harris and she agreed to go to [employer] [facility] every 4 months to assist with her SNAP benefits.  Kaylee Harris reports that she continues to go to her appointments.  The Probation officer then checked medical records and found that Kaylee Harris has an appointment on May 11th at 3pm.  The writer reminded Kaylee Harris of this appointment.  Kaylee Harris reports that she wrote down the appointment in her phone calendar now that the writer reminded her.  Kaylee Harris reports that she has been very stressed lately, due to her father going back into the hospital after retaining water and having a purple leg.  Kaylee Harris reports that her mom is not receptive to having a [place] come to their home and help them after her father comes home from the hospital.  The writer offered to speak to her parents with Kaylee Harris to help them understand that they could really use the assistance.    Kaylee Harris then thanked Careers information officer for her assistance.  Kaylee Harris reports needing nothing else at this time.

## 2018-09-25 NOTE — Telephone Encounter (Signed)
Faxed DOH forms to [place] at [phone]. Placed a copy into scanning. Script was faxed to [phone]

## 2018-09-26 ENCOUNTER — Encounter: Payer: Self-pay | Admitting: Primary Care

## 2018-09-26 DIAGNOSIS — I10 Essential (primary) hypertension: Secondary | ICD-10-CM

## 2018-09-29 ENCOUNTER — Encounter: Payer: Self-pay | Admitting: Primary Care

## 2018-09-29 MED ORDER — GENERIC DME *A*
0 refills | Status: DC
Start: 2018-09-29 — End: 2019-04-07

## 2018-09-29 NOTE — Telephone Encounter (Signed)
Generic order pended. Please fill out and send if appropriate.

## 2018-09-30 NOTE — Telephone Encounter (Signed)
Please fax Rx (in purple folder NG) to [facility]. NG

## 2018-10-02 LAB — HM DIABETES EYE EXAM

## 2018-10-05 ENCOUNTER — Ambulatory Visit: Payer: Medicare (Managed Care) | Attending: Psychiatry

## 2018-10-05 DIAGNOSIS — F603 Borderline personality disorder: Secondary | ICD-10-CM | POA: Insufficient documentation

## 2018-10-05 DIAGNOSIS — F331 Major depressive disorder, recurrent, moderate: Secondary | ICD-10-CM | POA: Insufficient documentation

## 2018-10-05 DIAGNOSIS — F4312 Post-traumatic stress disorder, chronic: Secondary | ICD-10-CM | POA: Insufficient documentation

## 2018-10-05 DIAGNOSIS — F422 Mixed obsessional thoughts and acts: Secondary | ICD-10-CM | POA: Insufficient documentation

## 2018-10-05 NOTE — Progress Notes (Signed)
Behavioral Health Progress Note   Patient encounter was performed via      [x]  Real time audiovisual    []   Telephone    Patient located at Home    Provider located at:   [x]  Home office  []  Clinical office   []   Administrative office         Other participants in this encounter and roles: N/A    Length of Session: 45 minutes    Contact Type:  Location: Telemedicine Covid-19    Telephone Contact     Problem(s)/Goals Addressed from Treatment Plan:    Problem 1:   Treatment Problem #1 08/31/2018   Patient Identified Problem OCD       Goal for this problem:    Treatment Goal #1 08/31/2018   Patient Identified Goal Utilize CBT to reduce behaviors       Progress towards this goal: Problem resolving. Comment: Patient reports mood stability some of the time.    Mental Status Exam:  APPEARANCE: Appears stated age, Casual  ATTITUDE TOWARD INTERVIEWER: Cooperative  MOTOR ACTIVITY: WNL (within normal limits)  EYE CONTACT: Direct  SPEECH: Normal rate and tone  AFFECT: Full Range  MOOD: Neutral  THOUGHT PROCESS: Circumstantial  THOUGHT CONTENT: No unusual themes and Negative Rumination  PERCEPTION: No evidence of hallucinations  CURRENT SUICIDAL IDEATION: patient denies  CURRENT HOMICIDAL IDEATION: Patient denies  ORIENTATION: Alert and Oriented X 3.  CONCENTRATION: Good  MEMORY:   Recent: intact   Remote: intact  COGNITIVE FUNCTION: Average intelligence  JUDGMENT: Intact  IMPULSE CONTROL: Fair  INSIGHT: Fair    Risk Assessment:  ASSESSMENT OF RISK FOR SUICIDAL BEHAVIOR  Changes in risk for suicide from baseline Formulation of Risk and/or previous intake, including newly identified risk, if any: none  Violence risk was assessed and No Change noted from baseline formulation of risk and/or previous assessment.    Session Content:: Patient reports mood stability some of the time. Patient told Probation officer that her father has been in the hospital 3 times since we last spoke. She said that she is always the one who takes him and walks him  in. Patient said that last time he was almost in a diabetic coma - sugar was over 600. She said that her mother keeps telling her that she is overwhelmed, driving, shopping, paperwork, care. Patient said that she got it set up for her mother and father to get extra help but her mother to complete paperwork. Patient said that her [facility] care manager set her up with "mom meals" - frozen - diabetic and respite care go to do crafts - when sherri is having a bad day or she has to help out her mother. She said that this would be four hours a week and someone would come in the house - do dishes, clean, etc. Patient talked about her difficulty in acceptance this assistance. She said that she doesn't know if she is ready for this and that is what she used to do. Patient said that she thinks this means she has "fallen further down" and is "more of a failure". Writer provided empathetic support and feedback. Writer and patient discussed the pros and cons of having extra help and patient said that she would contact the agency to discuss it further.    Visit Diagnosis:      ICD-10-CM ICD-9-CM   1. Mixed obsessional thoughts and acts F42.2 300.3   2. Borderline personality disorder F60.3 301.83   3. MDD (major  depressive disorder), recurrent episode, moderate F33.1 296.32   4. Chronic post-traumatic stress disorder (PTSD) F43.12 309.81       Interventions:  Supportive Psychotherapy  Problem solving for aide service    Current Treatment Plan   Created/Updated On 08/31/2018   Next Treatment Plan Due 11/29/2018     Consent was previously obtained from the patient to complete this Video consult; including the potential for financial liability.    Time spent on the  Video with patient: 40+ minutes    Length of time compiling the report:15 minutes    Plan:  Psychotherapy continues as described in care plan; plan remains the same.    NEXT APPT: 10/26/18.      Tresa Res, LCSW-R

## 2018-10-09 NOTE — Telephone Encounter (Signed)
Ok to take pt's sister and her son's as NPV (see mychart message)  [name], MD  [facility]  10/09/2018  8:27 AM

## 2018-10-13 ENCOUNTER — Other Ambulatory Visit: Payer: Self-pay | Admitting: Pulmonary and Critical Care Medicine

## 2018-10-13 DIAGNOSIS — Z760 Encounter for issue of repeat prescription: Secondary | ICD-10-CM

## 2018-10-13 NOTE — Telephone Encounter (Signed)
Confidential Drug Report  Search Terms: [name], March 30, 1976   Search Date: 10/13/2018 13:41:34 PM   Searching on behalf of: [id number] - [name]   The Drug Utilization Report below displays all of the controlled substance prescriptions, if any, that your patient has filled in the last twelve months. The information displayed on this report is compiled from pharmacy submissions to the Department, and accurately reflects the information as submitted by the pharmacies.  This report was requested by: Montez Morita   Reference #: [id number]   Others' Prescriptions  Patient Name: Kaylee Harris   Birth Date: [birth year]   Address: [place], [place] [address]   Sex: Female   Rx Written Rx Dispensed Drug Quantity Days Supply Prescriber Name Payment Method Dispenser   08/24/2018 08/27/2018 clonazepam 0.5 mg tablet  [address], [place]. #19   08/20/2018 08/21/2018 tramadol hcl 50 mg tablet  [address], [place]. #19   07/22/2018 07/22/2018 clonazepam 0.5 mg tablet  28 28 [name], Darnell Level (MD) [place]. #02   05/27/2018 06/02/2018 tramadol hcl 50 mg tablet  [address] [facility], [place]. #02   03/25/2018 03/25/2018 tramadol hcl 50 mg tablet  [address] [facility], [place]. #02   01/19/2018 01/22/2018 tramadol hcl 50 mg tablet  [address], [place]. #02   01/20/2018 01/22/2018 diphenoxylate-atropine 2.5-0.025 mg tablet  240 [address] ([facility]) [employer] [place]. #02   12/13/2017 12/13/2017 hydrocodone-acetaminophen 5-325 mg tablet  12 2 [facility], [place], [place]. #02   11/04/2017 11/12/2017 tramadol hcl 50 mg tablet  104 [place], [place]. #02   * - Drugs marked with an asterisk are compound drugs. If the compound drug is made up of more  than one controlled substance, then each controlled substance will be a separate row in the table.

## 2018-10-20 ENCOUNTER — Ambulatory Visit: Payer: Medicare (Managed Care) | Admitting: Psychiatry

## 2018-10-20 ENCOUNTER — Other Ambulatory Visit: Payer: Self-pay | Admitting: Primary Care

## 2018-10-20 DIAGNOSIS — E1165 Type 2 diabetes mellitus with hyperglycemia: Secondary | ICD-10-CM

## 2018-10-20 NOTE — Progress Notes (Addendum)
Endocrinology, Diabetes, and Metabolism FUV for Diabetes.    Video Visit     Location of Patient: home    Location of Telemedicine Provider: home office    Other participants in telemedicine encounter and roles:  [name]    This is an established patient visit.    Reason for visit: Follow-up      HPI:    This is a 43 y.o. female with a history of Type 2 Diabetes for almost 10 years.  Known complications include none.    Since her last visit, she has been doing very well. She has improved her Diabetes control significantly on her OmniPod pump. She also has the Dexcom G6 now. Her A1c is 6.8%    Denies any CP or SOB, no polyuria, no polydipsia. Occasional double vision when she feels a migraine is coming on. No blurry vision. No severe hypoglycemia requiring assistance or EMS. No paresthesias in hands or feet. No open wounds or sores on feet and performs daily self checks.Has ongoing diabetes health maintenance activities done including routine eye exams, podiatry and dental.    She had a thyroid ultrasound after her last visit which was not concerning for any acute thyroid issues. She denies any complaints    She is following closely with psych. She was started on seroquel XR 38m nightly to help her sleep.    Current diabetes regimen:   trulicity 16.0VPweekly    humalog in pump  OMNIPOD  Basal Rates:  MN 0.95 units/hr  3AM 1.45 units/hr  6AM 1.2 units/hr  10PM 0.95 units/hr  ICR 8  ISF 40  Target 140  Active Insulin 4h  49% basal /51% bolus  Glucose Sensor - G6 Dexcom    Not eating much during the days, POTS makes her feel tired and bloated.    She lacks motivation right now and feels fatigued often    Blood sugar logs and/or meter were brought to the visit for review.  Reviewed on Glooko/Dexcom  Past 30 days  Lowest BG in the 70s    Fasting 100s-200s  Lunch  100s-150s  Dinner 100s-180s  Bedtime 70s-200s    Blood sugars seem to rise after dinner occasionally. No lows.    Checking 4-6x daily.     hypoglycemia occurs 0  times a week.  It is most often at never.  There has been the need for medical assistance for these episodes none.      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 Lower abdomen with cellulitis changes, wound probed and purulent material expressed.  Had PICC line for "multiple infiltrations" of what? D/c-ed home on 10d of Augmentin 9/11.      Anginal pain     Anxiety     Arthritis     Asthma     Depression     Diabetes mellitus     Previously on SU and metformin, now diet controlled    Diverticulitis 09/2010    Dysfunctional uterine bleeding     Fibromyalgia     GERD (gastroesophageal reflux disease)     GERD (gastroesophageal reflux disease) 10/04/2016    High blood  pressure     Hyperlipidemia     Liver disease     fatty liver     Long term (current) use of insulin, Dermal Adhesed V-Go Insulin Delivery Device 10/04/2016    Migraine     Neuromuscular disorder     POTS (postural orthostatic tachycardia syndrome)     Sebaceous cyst of breast     right axilla    TMJ click, left 09/03/256    Varicella      Past Surgical History:   Procedure Laterality Date    APPENDECTOMY  2016    arthroscopic shoulder surgery Right 2010    Related to lifting    CARDIAC CATHETERIZATION  09/2011    negative    COLON SURGERY      COSMETIC SURGERY Bilateral 12/12/2017    B/L internal maxillary arteries embolized due to recurrent epistaxis, Dr Linard Millers    DILATION AND CURETTAGE OF UTERUS  2005, 2007    x2 for menorraghia    LEFT COLECTOMY  01/03/14    Dr Tresa Res    loop recorder  Feb 03 2014    loop recorder removal      PR COLONOSCOPY THRU COLOTOMY N/A 02/16/2016    Procedure: COLONOSCOPY;  Surgeon: Kelly Splinter,  MD;  Location: [facility];  Service: GI    PR CYSTOURETHROSCOPY,BIOPSY N/A 10/17/2016    Procedure: CYSTOSCOPY BLADDER ;  Surgeon: Ed Blalock, MD;  Location[facility];  Service: Urology    PR EDG TRANSORAL BIOPSY SINGLE/MULTIPLE N/A 10/29/2016    Procedure: EGD;  Surgeon: Kelly Splinter, MD;  Location: [facility];  Service: GI    TONSILLECTOMY       Family History   Problem Relation Age of Onset    Hypertension Father     Diabetes Father     Kidney Disease Father     Hyperlipidemia Father     Heart attack Father 37    Other Father         PVD    Heart Disease Father     Hypertension Mother     Hyperlipidemia Mother     Heart attack Mother 24    Diabetes Mother     Eczema Mother     Psoriasis Mother     Heart Disease Mother     Hypertension Brother     Heart Disease Brother 76        prinzmetal's angina    Heart Disease Sister         currently having work up    [place] Sister     Hypertension Sister     Breast cancer Maternal Grandmother     Stroke Other     Cancer Sister     Thyroid disease Sister     Anesthesia problems Neg Hx      Social History     Socioeconomic History    Marital status: Married     Spouse name: Not on file    Number of children: Not on file    Years of education: Not on file    Highest education level: Not on file   Tobacco Use    Smoking status: Former Smoker     Packs/day: 0.50     Years: 3.00     Pack years: 1.50     Types: Cigarettes    Smokeless tobacco: Never Used    Tobacco comment: quit age late 62s   Substance and Sexual Activity  Alcohol use: Yes     Alcohol/week: 0.0 standard drinks     Comment: 0-1 drink/ month    Drug use: No    Sexual activity: Yes     Partners: Female     Birth control/protection: I.U.D.   Other Topics Concern    Special Diet Not Asked    Exercise Not Asked    Weight Concern Not Asked    Caffeine Concern Not Asked   Social History Narrative    ** Merged History Encounter **         Married  to [employer], recently relocated back to [place] from [place] due to family stressors. 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        Allergies:   Allergies   Allergen Reactions    Morphine Itching    Trazodone Anaphylaxis    Seasonal Allergies Itching    Lidocaine Rash     Patches caused a rash    Nsaids Other (See Comments)     Bleeding/epistaxis    Other [Other] Other (See Comments)     Derma Bond(skin glue) pruritis/redness and c/o respiratory distress.   Received name: Other       Current Outpatient Medications on File Prior to Visit   Medication Sig Dispense Refill    traMADol (ULTRAM) 50 MG tablet TAKE 1 TABLET BY MOUTH EVERY 8 HOURS AS NEEDED FOR HEAD PAIN AND ABDOMINAL PAIN. MAXIMUM DAILY DOSE OF 3 TABLETS (150MG) PER DAY 42 tablet 0    generic DME Please dispense one digital BP cuff/monitor to check BP at home daily. Use as directed. 1 each 0    incontinence supply disposable Order Description:  Wear as needed for stool leakage 100 each 3    pantoprazole (PROTONIX) 40 MG EC tablet TAKE 1 TABLET BY MOUTH EVERY DAY, SWALLOW WHOLE. DO NOT BREAK, CHEW OR CRUSH 90 tablet 3    QUEtiapine (SEROQUEL XR) 50 MG 24 hr tablet Take 1 tablet (50 mg total) by mouth nightly Swallow whole. Do not chew, crush, or break. 90 tablet 3    Insulin Disposable Pump (OMNIPOD DASH 5 PACK) MISC By 1 Device no specified route as needed Change every 2 days = 45 pods for 90 days supply = 9 (5packs) 45 each 3    zinc oxide 20 % ointment Apply topically as needed for Dry Skin 30 g 3    clotrimazole (LOTRIMIN) 1 % cream Apply topically 2 times daily 15 g 3    clonazePAM (KLONOPIN) 0.5 MG tablet Take 1 tablet (0.5 mg total) by mouth daily as needed Max daily dose: 0.5 mg 28 tablet 0    TRULICITY 1.5 ZO/1.0RU injection pen INJECT THE CONTENTS OF ONE PEN (0.5 ML) INTO THE SKIN EVERY 7 DAYS 6 mL 3    Continuous Blood Gluc Sensor (DEXCOM G6 SENSOR) MISC By 1 Device no specified route as  needed 3 each 11    Continuous Blood Gluc Transmit (DEXCOM G6 TRANSMITTER) MISC By 1 Device no specified route as needed 1 each 3    Continuous Blood Gluc Receiver (DEXCOM G6 RECEIVER) DEVI By 1 Device no specified route as needed 1 Device 0    ascorbic acid (VITAMIN C) 500 MG tablet Take 1 tablet (500 mg total) by mouth daily 90 tablet 3    Alcohol Swabs (SM ALCOHOL PREP) 70 % PADS USE TWO TIMES DAILY TO CHECK BLOOD GLUCOSE 100 each 1    DULoxetine (CYMBALTA) 30  MG DR capsule TAKE 1 CAPSULE BY MOUTH TWO TIMES DAILY 180 capsule 3    cetirizine (ZYRTEC) 10 MG tablet Take 1 tablet (10 mg total) by mouth daily 90 tablet 1    ascorbic acid (VITAMIN C) 100 MG tablet Take 5 tablets (500 mg total) by mouth daily 100 tablet 3    lamoTRIgine (LAMICTAL) 150 MG tablet Take 1 tablet (150 mg total) by mouth daily 90 tablet 3    busPIRone (BUSPAR) 30 MG tablet Take 1 tablet (30 mg total) by mouth 2 times daily 180 tablet 3    insulin lispro 100 UNIT/ML injection vial Inject as directed up to MDD 100 units 40 mL 11    famciclovir (FAMVIR) 500 MG tablet Take 1 tablet (500 mg total) by mouth 3 times daily as needed 30 tablet 5    topiramate (TOPAMAX) 100 MG tablet Take 1 tablet (100 mg total) by mouth 2 times daily 180 tablet 3    glucagon (BAQSIMI) 3 MG/DOSE nasal powder Inhale one dose (3 mg) into one nostril once as needed for low blood sugar. If no response in 15 min, inhale second dose from new device 2 each 5    promethazine (PHENERGAN) 25 MG tablet Take 1 tablet (25 mg total) by mouth every 4-6 hours as needed 30 tablet 3    fluticasone (FLONASE) 50 MCG/ACT nasal spray SPRAY 1 SPRAY INTO EACH NOSTRIL ONE TIME DAILY 48 g 3    insulin syringe-needle U-100 (BD ULTRAFINE) 31G X 5/16" 0.3 ML HALF-UNIT Use 3 times a day as instructed. 100 each 11    VENTOLIN HFA 108 (90 Base) MCG/ACT inhaler INHALE 1 TO 2 PUFFS BY MOUTH INTO THE LUNGS EVERY 6 HOURS AS NEEDED FOR WHEEZING. SHAKE WELL BEFORE EACH USE. 18 Inhaler 5     Continuous Blood Gluc Receiver (DEXCOM G6 RECEIVER) DEVI By 1 Device no specified route as needed 1 Device 0    diphenoxylate-atropine (LOMOTIL) 2.5-0.025 MG per tablet Take 1-2 tablets by mouth 4 times daily as needed for Diarrhea   Max daily dose: 8 tablets Code D 240 tablet 0    blood glucose test strip Test  8 times a day.   Brand name of strips Contour NEXT test strips for linked omnipod pump 250 strip 11    lancets (BAYER MICROLET) Use   8  times per day as instructed for blood glucose testing. 250 each 11    azelastine (OPTIVAR) 0.05 % ophthalmic solution Place 1 drop into both eyes 2 times daily      SUMAtriptan refill (IMITREX STATDOSE) 6 MG/0.5ML injection INJECT 0.5ML UNDER THE SKIN ONCE AS NEEDED FOR MIGRAINE. MAY REPEAT DOSE ONCE AFTER 1 HOUR 2 Syringe 5    glucagon (GLUCAGEN) 1 MG injection kit Inject 1 mg into the skin once as needed for Low blood sugar   Discard any unused portion. 1 each 5    SUMAtriptan (IMITREX) 50 MG tablet TAKE 1 TABLET BY MOUTH AS NEEDED FOR MIGRAINE, TAKE AT ONSET OF HEADACHE. MAY REPEAT ONCE IN 2 HOURS 9 tablet 5    SUMAVEL DOSEPRO 6 MG/0.5ML needle-free injection INJECT 0.5MLS (6MG TOTAL) UNDER THE SKIN AS NEEDED FOR MIGRAINE MAY REPEAT ONCE AFTER 1 HOUR IF NEEDED  1    atorvastatin (LIPITOR) 40 MG tablet Take 1 tablet (40 mg total) by mouth daily (with dinner) 90 tablet 3    metoprolol (TOPROL-XL) 25 MG 24 hr tablet Take by mouth daily  midodrine (PROAMATINE) 10 MG tablet Take 1 tablet (10 mg total) by mouth 3 times daily 90 tablet 0    Non-System Medication The above patient is followed in our clinic and cannot resume work permanently. 1 each 1     No current facility-administered medications on file prior to visit.      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 no  -Dawn phenomenon:  yes  -HbA1c greater than 7% or 1% over upper range of normal: yes  -History of suboptimal glycemic control before or during pregnancy: na  -LAST 30 DAYS of BG LOGS (in notes below) or ranges from: Glooko/G6 report 70s-200s    Review of Systems:  CONSTITUTIONAL:  Appetite good, no fevers, night sweats, weight loss  HEAD: No headache, dizziness or syncope  EYES:No vision changes or eye pain, intermittent blurry vision with migraine  ENT: No hearing difficulties or ear pain  CV: No chest pain, shortness of breath or edema.  RESPIRATORY:  No SOB, cough or wheezing  GI:  No nausea, vomiting, abdominal pain or change in bowel habits  GU:  No dysuria, urgency or incontinence  NEURO:  No mental status changes, motor weakness or sensory changes  PSYCH:  + depression and anxiety, worsened since COVID and father being ill  MS:  No joint pain, swelling or musculoskeletal deformities  SKIN:  No rashes  HEME/LYMPH:  No easy bleeding, bruising or swollen nodes  ENDOCRINE:  No polyuria, polydipsia, polyphagia or heat/cold intolerance    Patient's problem list, allergies, and medications were reviewed and updated as appropriate.  Please see the EHR for full details.    Exam and data reviewed:    Physical Examination:   There were no vitals filed for this visit.  There is no height or weight on file to calculate BMI.  Wt Readings from Last 3 Encounters:   05/14/18 124.3 kg (274 lb)   05/08/18 121.7 kg (268 lb 3.2 oz)   04/21/18 120.7 kg (266 lb)       CONSTITUTIONAL:  well developed, well nourished, no acute distress  HEENT:  no lid lank, no proptosis.   NECK on inspection, no thyromegaly,   HEART/VASCULAR:no  LE edema  EXTREMITIES:  joints without deformity or synovitis. Foot exam deferred.  NEUROLOGICAL: alert and oriented x 3  SKIN:  No rashes.   ENDOCRINE: No supraclavicular fat pads, facial plethora, moon facies,   PSYCH:mood low    Labs and Imaging:    I personally reviewed and confirmed all laboratory and radiology testing listed below:      Lab results: 12/14/17  1527   Sodium 142   Potassium 3.7   Chloride 108   CO2 24   UN 15   Creatinine 0.9   GFR,Caucasian >60   GFR,Black >60   Glucose 120*   Calcium 9.3         Lab  Results   Component Value Date    HA1C 6.8 (H) 04/20/2018       Lab Results   Component Value Date    ALT 17 10/16/2017    AST 14 10/16/2017     Lab Results   Component Value Date    CHOL 160 05/13/2017    HDL 34 05/13/2017    LDLC 79 05/13/2017    TRIG 234 (!) 05/13/2017    CHHDC 4.7 05/13/2017     Lab Results   Component Value Date    TSH 1.50 01/22/2018       Lab Results   Component Value Date    WBC 16.5 (H) 12/14/2017    HGB 13.3 12/14/2017    HCT 42 12/14/2017    MCV 88 12/14/2017    PLT 372 12/14/2017       Assessment: This is a 43 y.o. female with Type 2 Diabetes Uncontrolled with hyperglycemia.  Goal A1c is <7%. She is not having any nocturnal lows since basal rates adjusted. Despite her personal stressors that have exacerbated anxiety and depression, she is doing well overall. Recommend temp basal with exercise if she begins to increase her outdfoor physical activity    Plan:   Continue current pump settings, use 30-50% reduction in basal rates with increased physical activity.  Continue Trulicity   Continue to follow with psych - if any change in seroquel dose please advise [place].  BP is at goal on current therapy.  On statin therapy.  Recommend annual dilated eye exams and biannual dental exams for routine health maintenance.  hba1c before next appointment    Follow up: 3 month(s) or sooner if needed.      Nada Libman, NP    Nada Libman, NP  [facility] Department of  Endocrinology  [address], [place], [place] [address]  Phone [phone]  Fax [phone]      Consent was obtained from the patient to complete this video visit; including the potential for financial liability.          Nada Libman, NP

## 2018-10-21 ENCOUNTER — Telehealth: Payer: Self-pay

## 2018-10-21 NOTE — Telephone Encounter (Signed)
The patient has been scheduled

## 2018-10-21 NOTE — Progress Notes (Signed)
Scheduled

## 2018-10-21 NOTE — Telephone Encounter (Signed)
-----   Message from [name], MD sent at 10/20/2018  3:42 PM EDT -----  Regarding: FW: physical and awv scheduled  Change to zoom re: DM/AWV  Please remind to do labs a few days before visit (order in)  ----- Message -----  From: Dorene Ar  Sent: 10/19/2018   1:18 PM EDT  To: [name], MD  Subject: physical and awv scheduled                       This patient is currently scheduled for a Physical & AWV in July.  Please advise where/how to schedule.  When responding, kindly respond to the staff pool.  Thank you.    Darnelle Maffucci

## 2018-10-21 NOTE — Telephone Encounter (Signed)
Called the patient and left a message, when the patient calls back, please schedule Appointment.

## 2018-10-23 ENCOUNTER — Encounter: Payer: Self-pay | Admitting: Primary Care

## 2018-10-26 ENCOUNTER — Ambulatory Visit: Payer: Medicare (Managed Care)

## 2018-10-26 DIAGNOSIS — F4312 Post-traumatic stress disorder, chronic: Secondary | ICD-10-CM

## 2018-10-26 DIAGNOSIS — F331 Major depressive disorder, recurrent, moderate: Secondary | ICD-10-CM

## 2018-10-26 DIAGNOSIS — F422 Mixed obsessional thoughts and acts: Secondary | ICD-10-CM

## 2018-10-26 DIAGNOSIS — F603 Borderline personality disorder: Secondary | ICD-10-CM

## 2018-10-26 NOTE — Progress Notes (Signed)
Behavioral Health Progress Note   Patient encounter was performed via Video     Patient located at: home    Provider located at: home office    Other participants in this encounter and roles: N/A    Consent was previously obtained from the patient to complete this service via telehealth; including the potential for financial liability.  Length of Session: 45 minutes    Contact Type:  Location: Telemedicine Covid-19    Telephone Contact     Problem(s)/Goals Addressed from Treatment Plan:    Problem 1:   Treatment Problem #1 08/31/2018   Patient Identified Problem OCD       Goal for this problem:    Treatment Goal #1 08/31/2018   Patient Identified Goal Utilize CBT to reduce behaviors       Progress towards this goal: Problem resolving. Comment: Patient reports setting better limits.    Mental Status Exam:  APPEARANCE: Appears stated age, Well-groomed, Casual  ATTITUDE TOWARD INTERVIEWER: Cooperative  MOTOR ACTIVITY: WNL (within normal limits)  EYE CONTACT: Direct  SPEECH: Normal rate and tone  AFFECT: Full Range  MOOD: Anxious and Depressed  THOUGHT PROCESS: Circumstantial  THOUGHT CONTENT: No unusual themes and Negative Rumination  PERCEPTION: No evidence of hallucinations  CURRENT SUICIDAL IDEATION: patient denies  CURRENT HOMICIDAL IDEATION: Patient denies  ORIENTATION: Alert and Oriented X 3.  CONCENTRATION: Good  MEMORY:   Recent: intact   Remote: intact  COGNITIVE FUNCTION: Average intelligence  JUDGMENT: Intact  IMPULSE CONTROL: Good and Fair  INSIGHT: Fair    Risk Assessment:  ASSESSMENT OF RISK FOR SUICIDAL BEHAVIOR  Changes in risk for suicide from baseline Formulation of Risk and/or previous intake, including newly identified risk, if any: none  Violence risk was assessed and No Change noted from baseline formulation of risk and/or previous assessment.    Session Content:: Patient told writer that she has been "ok". She said that things have been a little crazy - a "little situation" with her friend  because of "all this racism going on". Patient said that some of her family members say racist things on social media and her friend saw something a family member posted and her friend got angry, told patient that she wasn't reaching out, doesn't care about her kids, etc. She said that her friend's children are of mixed race, they consider her like an aunt, and the youngest is her godchild . Patient said that her friend wants them to come over for his birthday and she is worried about Covid. She said that she stood her ground on the graduation party for her cousin's kids who graduated. Patient said that she told them she did not feel comfortable going and now the "whole" family is mad at her. She said that she did father's day yesterday - they weren't wearing masks - they've all been isolated. Patient told Probation officer that she decided not to go to [employer] parents and [name] support her in this. Writer provided validation and support and commended patient on maintaining her boundaries. Patient said that she accepted the plan to have an aide come. Writer commended patient on effective problem solving.      Visit Diagnosis:      ICD-10-CM ICD-9-CM   1. Mixed obsessional thoughts and acts  F42.2 300.3   2. Borderline personality disorder  F60.3 301.83   3. MDD (major depressive disorder), recurrent episode, moderate  F33.1 296.32   4. Chronic post-traumatic stress disorder (PTSD)  F43.12 309.81  Interventions:  Supportive Psychotherapy  Taught/practiced coping skills (specify skills used):  observe limits, radical acceptance    Current Treatment Plan   Created/Updated On 08/31/2018   Next Treatment Plan Due 11/29/2018     Consent was previously obtained from the patient to complete this Video consult; including the potential for financial liability.    Time spent on the  Video with patient: 40+ minutes    Length of time compiling the report:15 minutes    Plan:  Psychotherapy continues as described in care plan; plan  remains the same.    NEXT APPT: 11/18/18.      Tresa Res, LCSW-R

## 2018-10-28 ENCOUNTER — Ambulatory Visit: Payer: Medicare (Managed Care) | Attending: Endocrinology | Admitting: Nutrition

## 2018-10-28 ENCOUNTER — Encounter: Payer: Self-pay | Admitting: Nutrition

## 2018-10-28 DIAGNOSIS — E119 Type 2 diabetes mellitus without complications: Secondary | ICD-10-CM | POA: Insufficient documentation

## 2018-10-28 DIAGNOSIS — Z794 Long term (current) use of insulin: Secondary | ICD-10-CM

## 2018-10-28 NOTE — Progress Notes (Signed)
Kaylee Harris called today for [facility] regarding Insulin requiring Type 2 Diabetes. Referred by: PCP; FOLLOWED BY ENDOCRINOLOGY    Telephone Visit due to COVID 19. Audio visit; Patient does not have video capability    This is an established patient visit.    Reason for visit: diabetes  The plan was discussed with the patient and the patient/patient rep demonstrated understanding to the provider's satisfaction.    Consent was previously obtained from the patient to complete this telephone consult; including the potential for financial liability.    21+ minutes was spent on the phone with the patient, patient representatives, and/or other attendees.     SUBJECTIVE  C/o indigestion and bloating; seeing PCP tomorrow; has hx IBS; may benefit from trial less Trulicity ?     Omni pod + Dexcom  Going well  On Trulicity meds covered by [facility] Readings from Last 3 Encounters:   05/14/18 124.3 kg (274 lb)   05/08/18 121.7 kg (268 lb 3.2 oz)   04/21/18 120.7 kg (266 lb)     Lab Results   Component Value Date    HA1C 6.8 (H) 04/20/2018    HA1C 7.0 (H) 01/16/2018    HA1C 7.2 (H) 10/20/2017    MALBR 0.43 04/23/2017    CREAT 0.9 12/14/2017    LDLC 79 05/13/2017       Diet: 2 meals + grazing on snacks;    Beverages: Powerade Zero; water, coffee, creamer   ETOH: no  Exercise : sedentary  ; dizzy spells    Meter: dexcom 6 report reviewed      Assessment:   DM- excellent DM control with 94% BG in range, 0% hypo  Reviewed low cal sm meal options for B/L as appetite is low   Reviewed extended bolus for hi fat meals,   Recent Gi distress / PCP to eval     Obesity no wt check to assess change; hx  down 17 lb Exercise very limited    Coun/Edu:     Barriers to learning:  Neuro problems  Education Provided: Plate Method label reading and carb factors    Plan:   Nutrition Goals:   1500 calories  150-180 gm carbs  Initial Weight goal: (-10%) 270 lb    Follow-up visit: 4 mo   Time spent counseling patient: 30  minutes    [employer] insurance MD follow up referral is in eRecord  .  MNT 1.5 hrs remain   Expires 05/06/19    Telephone Visit due to COVID 19.  Audio visit; Patient does not have video capability    This is an established patient visit.    Reason for visit: diabetes  The plan was discussed with the patient and the patient/patient rep demonstrated understanding to the provider's satisfaction.    Consent was previously obtained from the patient to complete this telephone consult; including the potential for financial liability.    21+ minutes was spent on the phone with the patient, patient representatives, and/or other attendees.     Marjo Bicker, RD

## 2018-10-28 NOTE — Progress Notes (Deleted)
Pre-Visit Planning    Health Maintenance Due   Topic Date Due    Diabetic Nephropathy Screening no ACE/ARB  04/10/1992    Diabetic Foot Exam ADA  07/10/2018    Diabetic A1C Monitoring ADA  10/20/2018    DEPRESSION SCREEN YEARLY  10/21/2018       Lab Results   Component Value Date    HA1C 6.8 (H) 04/20/2018     Lab Results   Component Value Date    MALBR 0.43 04/23/2017       Notes:   Last Mammogram: 11/10/2017 Completed @: [facility] [place]     - Eye exam - up to date    - Urine microalbumin - over due 04/23/18    - A1c - due soon 10/20/18     - Foot exam - over due    Completed on 10/28/18 by Tanna Savoy

## 2018-10-28 NOTE — Progress Notes (Signed)
The Probation officer received a phone call from [place].  Alvah reports that she is doing okay, and that she has a aide coming to her home today for 5 hours to help her with cleaning and food preparation.  Gabriellia reports that she is currently getting and taking her medications as prescribed by her providers.  The writer checked medical records and reminded Jaquasha of her upcoming appointments with June 22nd at 1pm with Tresa Res and on June 24th at 1:30 with Marjo Bicker.    Marticia reports that  she is a little nervous about having an aide come to her home, because she used to be a Automotive engineer.  She reports that now that she is no longer able to do that sort of work it is hard for her to have someone coming and helping her.  The Probation officer provided Annah with the support that she needed explaining that she needs the help.  The Probation officer received a phone call from Megan stating that her aide was cancelled again this week stating that [facility] was not able to find an aide to go to her home.  Loretha reports that it is the 2nd time in a row and that she wants to change MLTC's.  The Probation officer and Kasumi together called [facility] at [phone] and spoke to Care Coordinator Jacqlyn Krauss and explained that this is the 2nd time in a row that an aide was not provided for Sumi due to having a Tree surgeon.  Ria Comment reports that she was unaware of the situation and that they should have called her to explain that they are unable to accommodate the referral for PCA services.  Ria Comment reports that she is going to call Afnan back to inform her if they are able to accommodate her and if not help her switch to another [facility] that does have the capacity.

## 2018-10-29 ENCOUNTER — Encounter: Payer: Self-pay | Admitting: Primary Care

## 2018-10-29 ENCOUNTER — Ambulatory Visit: Payer: Medicare (Managed Care) | Admitting: Primary Care

## 2018-10-29 VITALS — BP 118/84 | HR 66 | Ht 68.0 in | Wt 274.0 lb

## 2018-10-29 DIAGNOSIS — F329 Major depressive disorder, single episode, unspecified: Secondary | ICD-10-CM

## 2018-10-29 DIAGNOSIS — E1165 Type 2 diabetes mellitus with hyperglycemia: Secondary | ICD-10-CM

## 2018-10-29 DIAGNOSIS — Z0000 Encounter for general adult medical examination without abnormal findings: Secondary | ICD-10-CM

## 2018-10-29 DIAGNOSIS — R142 Eructation: Secondary | ICD-10-CM

## 2018-10-29 DIAGNOSIS — F419 Anxiety disorder, unspecified: Secondary | ICD-10-CM

## 2018-10-29 DIAGNOSIS — M47816 Spondylosis without myelopathy or radiculopathy, lumbar region: Secondary | ICD-10-CM

## 2018-10-29 DIAGNOSIS — F32A Depression, unspecified: Secondary | ICD-10-CM

## 2018-10-29 NOTE — Progress Notes (Signed)
Kaylee Harris      TeleHome Video Encounter   Location of Patient: home  Location of Telemedicine Provider: clinical office  Other participants in telemedicine encounter and roles: no other participants.  Reason for visit: Gastrophageal Reflux  Patient's problem list, allergies, and medications were reviewed and updated as appropriate. Please see the EHR for full details.  Consent was obtained from the patient to complete this video visit; including the potential for financial liability.           SUBJECTIVE    Pt is here to discuss:    Chief Complaint   Patient presents with    Gastrophageal Reflux         1. Indigestion, burping - increase in episodes. Now noticing abnormal taste (eggs).  Also having bloating, poor appetite. Usually only has coffee in AM, then only 1 meal during daytime and snacks at night.  Wasn't sure if this was her trulicity (which she's been on for >59yr) or seroquel. Denies knowledge of weight gain or clothes fitting tighter.     2. Anxiety - father ended up having multiple hospitalizations, no COVID however. In retrospect his hospitalizations were less anxiety provoking than her original fear. Adherent with seroquel. Noticing that it helps reduce her worry over him. Gives example now that he'll be attending a grad party this weekend; "usually I would be throwing a fit, but now I just think that he's going to do what he wants to do and I can't worry about it."    3. DM - Saw endo yesterday. Due for bloodwork. Dexcom monitor projects her A1c to be 6.6. only hyperglycemia episodes are occurring overnight at 2am.     4.  [facility]. Still experiencing pain and wondering if medial branch blocks could be resubmitted to insurance company.     PMH / Family Hx / Social Hx  Patient's medications, allergies, problem list, past medical, social histories were reviewed and notable for:      Current Outpatient Medications   Medication Sig Note     pantoprazole (PROTONIX) 40 MG EC tablet TAKE 1 TABLET BY MOUTH EVERY DAY, SWALLOW WHOLE. DO NOT BREAK, CHEW OR CRUSH     QUEtiapine (SEROQUEL XR) 50 MG 24 hr tablet Take 1 tablet (50 mg total) by mouth nightly Swallow whole. Do not chew, crush, or break.     TRULICITY 1.5 MAY/3.0ZSinjection pen INJECT THE CONTENTS OF ONE PEN (0.5 ML) INTO THE SKIN EVERY 7 DAYS     ascorbic acid (VITAMIN C) 500 MG tablet Take 1 tablet (500 mg total) by mouth daily     DULoxetine (CYMBALTA) 30 MG DR capsule TAKE 1 CAPSULE BY MOUTH TWO TIMES DAILY     cetirizine (ZYRTEC) 10 MG tablet Take 1 tablet (10 mg total) by mouth daily     ascorbic acid (VITAMIN C) 100 MG tablet Take 5 tablets (500 mg total) by mouth daily     lamoTRIgine (LAMICTAL) 150 MG tablet Take 1 tablet (150 mg total) by mouth daily     busPIRone (BUSPAR) 30 MG tablet Take 1 tablet (30 mg total) by mouth 2 times daily     insulin lispro 100 UNIT/ML injection vial Inject as directed up to MDD 100 units     topiramate (TOPAMAX) 100 MG tablet Take 1 tablet (100 mg total) by mouth 2 times daily     fluticasone (FLONASE) 50 MCG/ACT nasal spray SPRAY 1  SPRAY INTO EACH NOSTRIL ONE TIME DAILY     azelastine (OPTIVAR) 0.05 % ophthalmic solution Place 1 drop into both eyes 2 times daily     atorvastatin (LIPITOR) 40 MG tablet Take 1 tablet (40 mg total) by mouth daily (with dinner)     metoprolol (TOPROL-XL) 25 MG 24 hr tablet Take by mouth daily    06/10/2016: Received from: External Pharmacy    midodrine (PROAMATINE) 10 MG tablet Take 1 tablet (10 mg total) by mouth 3 times daily     traMADol (ULTRAM) 50 MG tablet TAKE 1 TABLET BY MOUTH EVERY 8 HOURS AS NEEDED FOR HEAD PAIN AND ABDOMINAL PAIN. MAXIMUM DAILY DOSE OF 3 TABLETS (150MG) PER DAY     generic DME Please dispense one digital BP cuff/monitor to check BP at home daily. Use as directed.     incontinence supply disposable Order Description:  Wear as needed for stool leakage     Insulin Disposable Pump  (OMNIPOD DASH 5 PACK) MISC By 1 Device no specified route as needed Change every 2 days = 45 pods for 90 days supply = 9 (5packs)     zinc oxide 20 % ointment Apply topically as needed for Dry Skin     clotrimazole (LOTRIMIN) 1 % cream Apply topically 2 times daily     clonazePAM (KLONOPIN) 0.5 MG tablet Take 1 tablet (0.5 mg total) by mouth daily as needed Max daily dose: 0.5 mg     Continuous Blood Gluc Sensor (DEXCOM G6 SENSOR) MISC By 1 Device no specified route as needed     Continuous Blood Gluc Transmit (DEXCOM G6 TRANSMITTER) MISC By 1 Device no specified route as needed     Continuous Blood Gluc Receiver (DEXCOM G6 RECEIVER) DEVI By 1 Device no specified route as needed     Alcohol Swabs (SM ALCOHOL PREP) 70 % PADS USE TWO TIMES DAILY TO CHECK BLOOD GLUCOSE     famciclovir (FAMVIR) 500 MG tablet Take 1 tablet (500 mg total) by mouth 3 times daily as needed     glucagon (BAQSIMI) 3 MG/DOSE nasal powder Inhale one dose (3 mg) into one nostril once as needed for low blood sugar. If no response in 15 min, inhale second dose from new device     promethazine (PHENERGAN) 25 MG tablet Take 1 tablet (25 mg total) by mouth every 4-6 hours as needed     insulin syringe-needle U-100 (BD ULTRAFINE) 31G X 5/16" 0.3 ML HALF-UNIT Use 3 times a day as instructed.     VENTOLIN HFA 108 (90 Base) MCG/ACT inhaler INHALE 1 TO 2 PUFFS BY MOUTH INTO THE LUNGS EVERY 6 HOURS AS NEEDED FOR WHEEZING. SHAKE WELL BEFORE EACH USE.     Continuous Blood Gluc Receiver (DEXCOM G6 RECEIVER) DEVI By 1 Device no specified route as needed     diphenoxylate-atropine (LOMOTIL) 2.5-0.025 MG per tablet Take 1-2 tablets by mouth 4 times daily as needed for Diarrhea   Max daily dose: 8 tablets Code D     blood glucose test strip Test  8 times a day.   Brand name of strips Contour NEXT test strips for linked omnipod pump     lancets (BAYER MICROLET) Use   8  times per day as instructed for blood glucose testing.     SUMAtriptan refill  (IMITREX STATDOSE) 6 MG/0.5ML injection INJECT 0.5ML UNDER THE SKIN ONCE AS NEEDED FOR MIGRAINE. MAY REPEAT DOSE ONCE AFTER 1 HOUR     glucagon (GLUCAGEN)  1 MG injection kit Inject 1 mg into the skin once as needed for Low blood sugar   Discard any unused portion.     SUMAtriptan (IMITREX) 50 MG tablet TAKE 1 TABLET BY MOUTH AS NEEDED FOR MIGRAINE, TAKE AT ONSET OF HEADACHE. MAY REPEAT ONCE IN 2 HOURS     SUMAVEL DOSEPRO 6 MG/0.5ML needle-free injection INJECT 0.5MLS (6MG TOTAL) UNDER THE SKIN AS NEEDED FOR MIGRAINE MAY REPEAT ONCE AFTER 1 HOUR IF NEEDED 12/16/2016: Received from: External Pharmacy    Non-System Medication The above patient is followed in our clinic and cannot resume work permanently.           OBJECTIVE     Vitals:    10/29/18 1100   BP: 118/84   Pulse: 66   Weight: 124.3 kg (274 lb)   Height: 1.727 m (_0 )       General: well-appearing Caucasian female, pleasant & conversant, in NAD  Eyes:. PERRLA. EOMI. Conjunctiva pink, without swelling or exudate. Lids normal.    Psych: AAOx3, normal affect and mood. Insight and judgement intact.           ASSESSMENT & PLAN  1. Belching  2. Anxiety and depression  3. Uncontrolled type 2 diabetes mellitus with hyperglycemia      Reviewed risks of worsening DM , metabolic syndrome on seroquel. Given her reported subjective improvements in mood and previous control of DM however, we may want to continue medication as long as belching/burping/bloating SE are mild and A1c unchanged. Requested that pt update labs at next convenience. We could also consider transitioning from ER to IR version but pt declines this for now. Belching/burping/bloating unlikely to be SE of trulicity given she has been tolerating this for >41yrprior.  Encouraged to observe for now given the benefit of seroquel on anxiety sx           4. Lumbar facet arthropathy  Referred to [facility] for 2nd opinion    5. Preventative health care  AWV completed (see separate  note)    Follow-up: December in person re: DM      JJohny Drilling MD  [facility]  10/29/2018  11:10 AM        ______________________

## 2018-10-29 NOTE — Progress Notes (Signed)
Due to pandemic event, telehome visit preformed via:             Video/Zoom     Location of Patient: home  Location of Telemedicine Provider: clinical office   Other participants in telemedicine encounter and roles: n/a    Consent was obtained from the patient to complete this video visit; including the potential for financial liability.    This visit was performed during a pandemic event and the vital signs including BMI and physical exam are limited or missing due to the patient's location.  _____________________________________________________________________    Today we reviewed and updated Kaylee Harris smoking status, activities of daily living, depression screen, fall risk, medications and allergies.   I have counseled the patient in the above areas.     Subjective:     Chief Complaint: Kaylee Harris is a 43 y.o. female here for a/an Gastrophageal Reflux and Initial Annual Medicare visit    In general, Kaylee Harris rates their overall health as:  fair      Patient Care Team:  [name], MD as PCP - General  Trilby Leaver, MD  Isidoro Donning as [place] - Internal  Tresa Res, LCSW-R as Social Worker  Nordquist, Janett Billow, RN as Registered Nurse  Nada Libman, NP as Consulting Provider (Endocrinology)     Current Outpatient Medications on File Prior to Visit   Medication Sig Dispense Refill    pantoprazole (PROTONIX) 40 MG EC tablet TAKE 1 TABLET BY MOUTH EVERY DAY, SWALLOW WHOLE. DO NOT BREAK, CHEW OR CRUSH 90 tablet 3    QUEtiapine (SEROQUEL XR) 50 MG 24 hr tablet Take 1 tablet (50 mg total) by mouth nightly Swallow whole. Do not chew, crush, or break. 90 tablet 3    TRULICITY 1.5 VO/1.6WV injection pen INJECT THE CONTENTS OF ONE PEN (0.5 ML) INTO THE SKIN EVERY 7 DAYS 6 mL 3    ascorbic acid (VITAMIN C) 500 MG tablet Take 1 tablet (500 mg total) by mouth daily 90 tablet 3    DULoxetine (CYMBALTA) 30 MG DR capsule TAKE 1 CAPSULE BY MOUTH TWO TIMES DAILY 180  capsule 3    cetirizine (ZYRTEC) 10 MG tablet Take 1 tablet (10 mg total) by mouth daily 90 tablet 1    ascorbic acid (VITAMIN C) 100 MG tablet Take 5 tablets (500 mg total) by mouth daily 100 tablet 3    lamoTRIgine (LAMICTAL) 150 MG tablet Take 1 tablet (150 mg total) by mouth daily 90 tablet 3    busPIRone (BUSPAR) 30 MG tablet Take 1 tablet (30 mg total) by mouth 2 times daily 180 tablet 3    insulin lispro 100 UNIT/ML injection vial Inject as directed up to MDD 100 units 40 mL 11    topiramate (TOPAMAX) 100 MG tablet Take 1 tablet (100 mg total) by mouth 2 times daily 180 tablet 3    fluticasone (FLONASE) 50 MCG/ACT nasal spray SPRAY 1 SPRAY INTO EACH NOSTRIL ONE TIME DAILY 48 g 3    azelastine (OPTIVAR) 0.05 % ophthalmic solution Place 1 drop into both eyes 2 times daily      atorvastatin (LIPITOR) 40 MG tablet Take 1 tablet (40 mg total) by mouth daily (with dinner) 90 tablet 3    metoprolol (TOPROL-XL) 25 MG 24 hr tablet Take by mouth daily         midodrine (PROAMATINE) 10 MG tablet Take 1 tablet (10 mg total) by mouth 3 times  daily 90 tablet 0    traMADol (ULTRAM) 50 MG tablet TAKE 1 TABLET BY MOUTH EVERY 8 HOURS AS NEEDED FOR HEAD PAIN AND ABDOMINAL PAIN. MAXIMUM DAILY DOSE OF 3 TABLETS (150MG) PER DAY 42 tablet 0    generic DME Please dispense one digital BP cuff/monitor to check BP at home daily. Use as directed. 1 each 0    incontinence supply disposable Order Description:  Wear as needed for stool leakage 100 each 3    Insulin Disposable Pump (OMNIPOD DASH 5 PACK) MISC By 1 Device no specified route as needed Change every 2 days = 45 pods for 90 days supply = 9 (5packs) 45 each 3    zinc oxide 20 % ointment Apply topically as needed for Dry Skin 30 g 3    clotrimazole (LOTRIMIN) 1 % cream Apply topically 2 times daily 15 g 3    clonazePAM (KLONOPIN) 0.5 MG tablet Take 1 tablet (0.5 mg total) by mouth daily as needed Max daily dose: 0.5 mg 28 tablet 0    Continuous Blood Gluc Sensor  (DEXCOM G6 SENSOR) MISC By 1 Device no specified route as needed 3 each 11    Continuous Blood Gluc Transmit (DEXCOM G6 TRANSMITTER) MISC By 1 Device no specified route as needed 1 each 3    Continuous Blood Gluc Receiver (DEXCOM G6 RECEIVER) DEVI By 1 Device no specified route as needed 1 Device 0    Alcohol Swabs (SM ALCOHOL PREP) 70 % PADS USE TWO TIMES DAILY TO CHECK BLOOD GLUCOSE 100 each 1    famciclovir (FAMVIR) 500 MG tablet Take 1 tablet (500 mg total) by mouth 3 times daily as needed 30 tablet 5    glucagon (BAQSIMI) 3 MG/DOSE nasal powder Inhale one dose (3 mg) into one nostril once as needed for low blood sugar. If no response in 15 min, inhale second dose from new device 2 each 5    promethazine (PHENERGAN) 25 MG tablet Take 1 tablet (25 mg total) by mouth every 4-6 hours as needed 30 tablet 3    insulin syringe-needle U-100 (BD ULTRAFINE) 31G X 5/16" 0.3 ML HALF-UNIT Use 3 times a day as instructed. 100 each 11    VENTOLIN HFA 108 (90 Base) MCG/ACT inhaler INHALE 1 TO 2 PUFFS BY MOUTH INTO THE LUNGS EVERY 6 HOURS AS NEEDED FOR WHEEZING. SHAKE WELL BEFORE EACH USE. 18 Inhaler 5    Continuous Blood Gluc Receiver (DEXCOM G6 RECEIVER) DEVI By 1 Device no specified route as needed 1 Device 0    diphenoxylate-atropine (LOMOTIL) 2.5-0.025 MG per tablet Take 1-2 tablets by mouth 4 times daily as needed for Diarrhea   Max daily dose: 8 tablets Code D 240 tablet 0    blood glucose test strip Test  8 times a day.   Brand name of strips Contour NEXT test strips for linked omnipod pump 250 strip 11    lancets (BAYER MICROLET) Use   8  times per day as instructed for blood glucose testing. 250 each 11    SUMAtriptan refill (IMITREX STATDOSE) 6 MG/0.5ML injection INJECT 0.5ML UNDER THE SKIN ONCE AS NEEDED FOR MIGRAINE. MAY REPEAT DOSE ONCE AFTER 1 HOUR 2 Syringe 5    glucagon (GLUCAGEN) 1 MG injection kit Inject 1 mg into the skin once as needed for Low blood sugar   Discard any unused portion. 1 each 5     SUMAtriptan (IMITREX) 50 MG tablet TAKE 1 TABLET BY MOUTH AS NEEDED FOR MIGRAINE,  TAKE AT ONSET OF HEADACHE. MAY REPEAT ONCE IN 2 HOURS 9 tablet 5    SUMAVEL DOSEPRO 6 MG/0.5ML needle-free injection INJECT 0.5MLS ('6MG'$  TOTAL) UNDER THE SKIN AS NEEDED FOR MIGRAINE MAY REPEAT ONCE AFTER 1 HOUR IF NEEDED  1    Non-System Medication The above patient is followed in our clinic and cannot resume work permanently. 1 each 1     No current facility-administered medications on file prior to visit.      Allergies   Allergen Reactions    Morphine Itching    Trazodone Anaphylaxis    Seasonal Allergies Itching    Lidocaine Rash     Patches caused a rash    Nsaids Other (See Comments)     Bleeding/epistaxis    Other [Other] Other (See Comments)     Derma Bond(skin glue) pruritis/redness and c/o respiratory distress.   Received name: Other     Patient Active Problem List    Diagnosis Date Noted    At risk for abuse of opiates 06/29/2014     Priority: High     DO NOT GIVE IV NARCOTICS OR RX      History of repeated overdose 06/29/2014     Priority: High     DO NOT GIVE IV NARCOTICS      Borderline personality disorder 06/29/2014     Priority: High    Fibromyalgia 03/24/2013     Priority: High     Cymbalta -> lyrica 01/22/2017       Syncope and collapse- recurrent, working diagnosis vascular vs psychogenic. Non-arrythmic per Dr Lovena Le 10/2014 03/24/2013     Priority: High     Hospitalized in 09/2011 with angina, had normal cardiac catheterization then developed syncopal episodes and hypotension following this hospitalization.   24h holter 10/2011 which showed brief mobitz 1, +tilt table testing, treated with Florinef, compression stockings, fluids/salt liberalization.   Seemed to have improvement for ~1 yr, then recurrent syncope in 10/2012. Resolved with use of compression stockings and avoidance of gluten  Loop monitor placed 10/1 by [facility]; last seen 5/11 with normal sinus rhythm even during episodes. Transitioning off  ranexa as of 09/13/13 to decrease medication burden  Nuclear stress test 7/27 negative. Transitioned from theophylline to midodrine 7/7    05/2014: taken off all meds due to escalating episodes and no cardiac etiology. Felt to be psychogenic per discussion with Dr Kerin Ransom and [name] (psych). Given protective helmet and will focus more on psychiatric treatment/care. Both pt and wife are not in agreement with this diagnosis    07/2014: seen by neurologist and requested second opinion from different cardiologist and EEG. Underwent one time EEG (normal 3/24), if neg will pursue extended monitoring    09/05/14 referred to Dr Lovena Le for second opinion in cardiology  Repeat tilt tabel 09/28/14 + and suggestive of POTS although per Dr Tanna Furry notes "hemodynamics not c/w POTS"  10/2014: started on atenolol and midodrine, cogentin d/c-ed  02/07/2015: repeat evaluation by [name], [facility] cardiologist; recommended consideration of prolonged inpatient monitoring (such as [facility] inpatient neuro with camera monitoring, EEG and HR monitoring).  May need to consider migraine variant as a cause of syncope.  Cardiologist's comments on meds: better after stopping Cogentin, now on Abilify for bipolar, tolerating atenolol, increase midodrine to 3x/day, try salt tablets, support hose daily, continue abd binder.)  She does not have cardiac or arrhythmia problems.  Needs to continue intensive treatment with psychiatry and neurology.  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 sent to [facility] -> [facility] program (dismissed 06/2014 due to frequent  abscenses)  6/16: cogentin and latuda d/c-ed , transitioned to abilify  06/2015: abilify decreased due to uncontrolled DM      DM (diabetes mellitus), type 2, uncontrolled 03/09/2009     Priority: High     Undergoing Lantus titration; previously intolerant of glipizide and metformin  10/14/2014 mealtime insulin  06/2015 trial of splitting lantus (no change), increasing lantus and humalog  07/2015 transitioned to GLP1 agonist and VGO      Migraine with aura 03/09/2009     Priority: Medium    Hyperlipemia 03/09/2009     Priority: Medium             IUD (intrauterine device) in place 03/24/2013     Priority: Low     10/29/11  11/03/2017       Asthma 03/09/2009     Priority: Low     Created by Conversion        First degree heart block 12/18/2017     PR 226 8/19      Lumbar facet arthropathy 07/03/2017     Moderate lumbar DJD on xray 12/18  MRI shows mild disc dessication and facet joint swelling at L4/5, no central stenosis or herniation  REcommended exercise  05/20/17 s/p b/l L4/5 facet joint injection    Pending median branch block protocol with plan towards pursuing radiofrequency ablation      Labial abscess 03/28/2017    Right hip pain 02/25/2017     S/p TB injection 02/2017      Morbid obesity with BMI of 40.0-44.9, adult 10/17/2016    Hematuria 10/17/2016     Likely 2/2 bladder catheter trauma  Felt to have ?bladder mass on first cystoscopy. Repeat was neg for mass      GERD (gastroesophageal reflux disease) 24/40/1027    TMJ click, left 25/36/6440    Long term (current) use of insulin, Dermal Adhesed V-Go Insulin Delivery Device 10/04/2016    POTS (postural orthostatic tachycardia syndrome) 01/15/2016    Anxiety 01/14/2016    Bipolar 1 disorder 05/03/2015    Traumatic hemorrhage of right cerebrum 05/03/2015    Fatty liver disease, nonalcoholic 34/74/2595     Hepatomegaly (19cm) on 09/25/14 [place], normal LFTs      No diabetic retinopathy in both eyes 04/05/2014    Meralgia paresthetica of left side  10/06/2013    Mixed obsessional thoughts and acts 10/04/2013    Carpal tunnel syndrome 10/04/2013     EMG 09/2013 neg for denervation, R>L carpal tunnel. Recommended splinting      IBS (irritable bowel syndrome) 05/31/2013     Seen in ED 5/21 and 22 with RUQ pain, [place] neg for gallstones, labs normal. US showed hepatomegaly with steatosis  Chronic diarrhea and pain since. 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 Lower abdomen with cellulitis changes, wound probed and purulent material expressed.  Had PICC line for "multiple infiltrations" of what? D/c-ed home on 10d of Augmentin 9/11.      Anginal pain     Anxiety     Arthritis     Asthma     Depression     Diabetes mellitus     Previously on SU and metformin, now diet controlled    Diverticulitis 09/2010    Dysfunctional uterine bleeding     Fibromyalgia     GERD (gastroesophageal reflux disease)     GERD (gastroesophageal reflux disease) 10/04/2016    High blood pressure     Hyperlipidemia     Liver disease     fatty liver     Long term (current) use of insulin, Dermal Adhesed V-Go Insulin Delivery Device 10/04/2016    Migraine     Neuromuscular disorder     POTS (postural orthostatic tachycardia syndrome)     Sebaceous cyst of breast     right axilla    TMJ click, left 0/08/5407    Varicella      Past Surgical History:   Procedure Laterality Date    APPENDECTOMY  2016    arthroscopic shoulder surgery Right 2010    Related to lifting    CARDIAC CATHETERIZATION  09/2011    negative    COLON SURGERY      COSMETIC SURGERY  Bilateral 12/12/2017    B/L internal maxillary arteries embolized due to recurrent epistaxis, Dr Linard Millers    DILATION AND CURETTAGE OF UTERUS  2005, 2007    x2 for menorraghia    LEFT COLECTOMY  01/03/14    Dr Tresa Res    loop recorder  Feb 03 2014    loop recorder removal      PR COLONOSCOPY THRU COLOTOMY N/A 02/16/2016    Procedure: COLONOSCOPY;  Surgeon: Kelly Splinter, MD;  Location: [facility];  Service: GI    PR CYSTOURETHROSCOPY,BIOPSY N/A 10/17/2016    Procedure: CYSTOSCOPY BLADDER ;  Surgeon: Ed Blalock, MD;  Location: [facility];  Service: Urology    PR EDG TRANSORAL BIOPSY SINGLE/MULTIPLE N/A 10/29/2016    Procedure: EGD;  Surgeon: Kelly Splinter, MD;  Location: [facility];  Service: GI    TONSILLECTOMY       Family History   Problem Relation Age of Onset    Hypertension Father     Diabetes Father     Kidney Disease Father     Hyperlipidemia Father     Heart attack Father 2    Other Father         PVD    Heart Disease Father     Hypertension Mother     Hyperlipidemia Mother     Heart attack Mother 50    Diabetes Mother     Eczema Mother     Psoriasis Mother     Heart Disease Mother     Hypertension Brother     Heart Disease Brother 49        prinzmetal's angina    Heart Disease Sister         currently having work up    Fainting Sister  Hypertension Sister     Breast cancer Maternal Grandmother     Stroke Other     Cancer Sister     Thyroid disease Sister     Anesthesia problems Neg Hx      Social History     Socioeconomic History    Marital status: Married     Spouse name: Not on file    Number of children: Not on file    Years of education: Not on file    Highest education level: Not on file   Occupational History    Not on file   Tobacco Use    Smoking status: Former Smoker     Packs/day: 0.50     Years: 3.00     Pack years: 1.50     Types: Cigarettes    Smokeless tobacco: Never Used    Tobacco comment: quit age late  56s   Substance and Sexual Activity    Alcohol use: Yes     Alcohol/week: 0.0 standard drinks     Comment: 0-1 drink/ month    Drug use: No    Sexual activity: Yes     Partners: Female     Birth control/protection: I.U.D.   Social History Narrative    ** Merged History Encounter **         Married to Kaylee Harris, recently relocated back to [place] from [place] due to family stressors. 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        Objective:     Vital Signs: BP 118/84    Pulse 66    BMI: Body mass index is 41.66 kg/m.      Vision Screening Results (Welcome visit only):  No exam data present    Depression Screening Results:  Recent Review Flowsheet Data     PHQ Scores 10/29/2018 10/20/2017 09/30/2017 08/14/2017 06/09/2017 05/22/2017 04/23/2017    PSQ2 Q1 - Interest/Pleasure N N - - - - N    PSQ2 Q2 - Down, Depressed, Hopeless N N - - - - N    PHQ Q9 - Better Off Dead - - 0 0 0 0 -    PHQ Calculated Score - - _0 -        Opioid Use/DAST- 10 Screening Results:   How many times in the past year have you used an illegal drug or used a prescription medication for nonmedical reasons?: 0 (10/29/2018 11:32 AM)    Activities of Daily Living/Functional Screening Results:  Is the person deaf or does he/she have serious difficulty hearing?: N  Is this person blind or does he/she have serious difficulty seeing even when wearing glasses?: N  *Vision Status: Visual aid   Does this person have serious difficulty walking or climbing stairs?: N  Does this person have difficulty dressing or bathing?: N (uses shower chair)  *Shopping: Independent  *House Keeping: Independent  *Managing Own Medications: Independent  *Handling Finances: Independent  If you need help, who helps you?: wife drives  Difficulty doing errands due to a physicial, mental or emotional condition: Yes (POTs, anxiety, relies on wife)  Difficulty remembering or making decisions due to a physicial, mental or emotional condition:  No      Fall Risk Screening Results:  Have you fallen in the last year?: Yes (syncope 1 week ago)  Did you sustain an injury which required medical attention?: No  What level of medical attention?: ED  Do you feel you  are at risk for falling?: Yes  Do you feel your risk of falling is: : Moderate (POTS)  Would you like to learn more about how to reduce your risk of falling?: No      Assessment and Plan:      Cognitive Function:  Recall of recent and remote events appears:  Normal      Advanced Care Planning:  was discussed and the paperwork can be found in the scanned media section    Confirmed wife Kaylee Harris as primary, sister Kaylee Harris as secondary    The following health maintenance plan was reviewed with the patient:    Health Maintenance Topics with due status: Overdue       Topic Date Due    Diabetic Nephropathy Screening no ACE/ARB 04/10/1992    Diabetic Foot Exam ADA 07/10/2018    Diabetic A1C Monitoring ADA 10/20/2018    DEPRESSION SCREEN YEARLY 10/21/2018     Health Maintenance Topics with due status: Not Due       Topic Last Completion Date    Diabetic Eye Exam ADA 09/25/2017    Cervical Cancer Screening 11/03/2017    Breast Cancer Screening Other 11/10/2017     Health Maintenance Topics with due status: Completed       Topic Last Completion Date    IMM-INFLUENZA 03/02/2018     Health Maintenance Topics with due status: Addressed       Topic Date Due    HIV TESTING OFFERED Addressed     This health maintenance schedule, identified risks, a list of orders placed today and patient goals have been provided to Kaylee Harris in the after visit summary.     Plan for any concerns identified during screening or risk assessments:    labwork recommended  Unable to DM foot exam virtually today, reviewed records and last podiatry appt 02/2018    [name], MD  [facility]  10/29/2018  1:07 PM

## 2018-10-29 NOTE — Patient Instructions (Signed)
Thank you for completing your Gastrophageal Reflux and Initial Annual Medicare visit   with [place] today.     The purpose of this visits was to:     Screen for disease   Assess risk of future medical problems   Help develop a healthy lifestyle   Update vaccines   Get to know your doctor in case of an illness    Patient Care Team:  [name], MD as PCP - Allegra Grana, MD  [name], MD  [name], MD  Isidoro Donning as [place] - Internal     Medicare 5 Year Plan    The following items were identified as areas of concern during your screening today:      The Health Maintenance table below identifies screening tests and immunizations recommended by your health care team:  Health Maintenance   Topic Date Due    Diabetic Kidney Disease  04/10/1992    Diabetic Foot Exam  07/10/2018    Diabetic A1C Monitoring  10/20/2018    DEPRESSION SCREEN YEARLY  10/21/2018    Diabetic Eye Exam  09/26/2019    Breast Cancer Screening  11/11/2019    Cervical Cancer Screening   11/04/2022    Flu Shot  Completed    HIV TESTING OFFERED  Addressed     In addition, goals and orders placed to address these recommendations are listed in the "Today's Visit" section.    We wish you the best of health and look forward to seeing you again next year for your Annual Medicare Wellness Visit.     If you have any health care concerns before then, please do not hesitate to contact us.

## 2018-11-02 ENCOUNTER — Telehealth: Payer: Self-pay

## 2018-11-02 NOTE — Telephone Encounter (Signed)
-----   Message from [name], MD sent at 11/02/2018  9:00 AM EDT -----  Please call pt and ask if she was able to check BP and weight over weekend? Please document vitals in this encounter if so.

## 2018-11-02 NOTE — Telephone Encounter (Signed)
Left message for patient to return call.

## 2018-11-03 NOTE — Telephone Encounter (Signed)
Blood pressure 118/84, pulse 66. Added to the telemed encounter as well.

## 2018-11-03 NOTE — Telephone Encounter (Signed)
Left a message for pt to give the office a call back.  Will send a mychart message as well.

## 2018-11-10 ENCOUNTER — Other Ambulatory Visit: Payer: Self-pay | Admitting: Primary Care

## 2018-11-10 ENCOUNTER — Telehealth: Payer: Medicare (Managed Care) | Admitting: Primary Care

## 2018-11-10 DIAGNOSIS — K589 Irritable bowel syndrome without diarrhea: Secondary | ICD-10-CM

## 2018-11-11 NOTE — Telephone Encounter (Signed)
Last appt: 05/14/2018     Next appt:  04/16/2019        Recent Lab Values 01/04/2018 12/17/2017 10/17/2017 10/17/2017 03/31/2017 10/29/2016 10/17/2016   EXP DATE 9/20 02/02/2018 11-03-18 08-04-18 04/04/18 04/04/2017 08/03/17   [facility] - - - - - - -

## 2018-11-11 NOTE — Telephone Encounter (Signed)
Search Terms: Del Wiseman, 1975/07/24   Search Date: 11/11/2018 16:53:06 PM   Searching on behalf of: [id number] - [name]   The Drug Utilization Report below displays all of the controlled substance prescriptions, if any, that your patient has filled in the last twelve months. The information displayed on this report is compiled from pharmacy submissions to the Department, and accurately reflects the information as submitted by the pharmacies.  This report was requested by: Dorthula Matas   Reference #: [id number]   Others' Prescriptions  Patient Name: Kaylee Harris   Birth Date: [birth year]   Address: [place], [place] [address]   Sex: Female   Rx Written Rx Dispensed Drug Quantity Days Supply Prescriber Name Payment Method Dispenser   10/13/2018 10/15/2018 tramadol hcl 50 mg tablet  42 14 [place], [place]. #19   08/24/2018 08/27/2018 clonazepam 0.5 mg tablet  28 28 [place], [place]. #19   08/20/2018 08/21/2018 tramadol hcl 50 mg tablet  42 14 [place], [place]. #19   07/22/2018 07/22/2018 clonazepam 0.5 mg tablet  28 28 [name], Darnell Level (MD) [place]. #02   05/27/2018 06/02/2018 tramadol hcl 50 mg tablet  42 14 [place], [place]. #02   03/25/2018 03/25/2018 tramadol hcl 50 mg tablet  42 14 [facility], [place]. #02   01/19/2018 01/22/2018 tramadol hcl 50 mg tablet  42 14 [place], [place]. #02   01/20/2018 01/22/2018 diphenoxylate-atropine 2.5-0.025 mg tablet  240 30 [facility] Nab A ([facility]) [employer] [place]. #02   12/13/2017 12/13/2017 hydrocodone-acetaminophen 5-325 mg tablet  12 2 [place], [place], [place]. #02   11/04/2017 11/12/2017 tramadol hcl 50 mg tablet  13 14 [place], [place]. #02   * - Drugs  marked with an asterisk are compound drugs. If the compound drug is made up of more than one controlled substance, then each controlled substance will be a separate row in the table.

## 2018-11-12 MED ORDER — DIPHENOXYLATE-ATROPINE 2.5-0.025 MG PO TABS *I*
1.0000 | ORAL_TABLET | Freq: Four times a day (QID) | ORAL | 0 refills | Status: DC | PRN
Start: 2018-11-12 — End: 2019-02-23

## 2018-11-13 ENCOUNTER — Encounter: Payer: Self-pay | Admitting: Gastroenterology

## 2018-11-18 ENCOUNTER — Ambulatory Visit: Payer: Medicare (Managed Care) | Attending: Psychiatry

## 2018-11-18 ENCOUNTER — Encounter: Payer: Self-pay | Admitting: Primary Care

## 2018-11-18 DIAGNOSIS — F4312 Post-traumatic stress disorder, chronic: Secondary | ICD-10-CM | POA: Insufficient documentation

## 2018-11-18 DIAGNOSIS — F603 Borderline personality disorder: Secondary | ICD-10-CM | POA: Insufficient documentation

## 2018-11-18 DIAGNOSIS — F422 Mixed obsessional thoughts and acts: Secondary | ICD-10-CM

## 2018-11-18 DIAGNOSIS — F331 Major depressive disorder, recurrent, moderate: Secondary | ICD-10-CM | POA: Insufficient documentation

## 2018-11-18 NOTE — Progress Notes (Signed)
Behavioral Health Progress Note   Patient encounter was performed via Video     Patient located at: home    Provider located at: home office    Other participants in this encounter and roles: N/A    Consent was previously obtained from the patient to complete this service via telehealth; including the potential for financial liability.  Length of Session: 45 minutes    Contact Type:  Location: Telemedicine Covid-19    Telephone Contact     Problem(s)/Goals Addressed from Treatment Plan:    Problem 1:   Treatment Problem #1 08/31/2018   Patient Identified Problem OCD       Goal for this problem:    Treatment Goal #1 08/31/2018   Patient Identified Goal Utilize CBT to reduce behaviors       Progress towards this goal: Problem resolving. Comment: Patient reports working to set effective boundaries.    Mental Status Exam:  APPEARANCE: Appears stated age, Casual  ATTITUDE TOWARD INTERVIEWER: Cooperative  MOTOR ACTIVITY: WNL (within normal limits)  EYE CONTACT: Direct  SPEECH: Normal rate and tone  AFFECT: Full Range  MOOD: Anxious, Depressed, Lively and Neutral  THOUGHT PROCESS: Normal and Circumstantial  THOUGHT CONTENT: No unusual themes  PERCEPTION: No evidence of hallucinations  CURRENT SUICIDAL IDEATION: patient denies  CURRENT HOMICIDAL IDEATION: Patient denies  ORIENTATION: Alert and Oriented X 3.  CONCENTRATION: Good  MEMORY:   Recent: intact   Remote: intact  COGNITIVE FUNCTION: Average intelligence  JUDGMENT: Intact  IMPULSE CONTROL: Good and Fair  INSIGHT: Good and Fair    Risk Assessment:  ASSESSMENT OF RISK FOR SUICIDAL BEHAVIOR  Changes in risk for suicide from baseline Formulation of Risk and/or previous intake, including newly identified risk, if any: none  Violence risk was assessed and No Change noted from baseline formulation of risk and/or previous assessment.    Session Content::  Patient told writer that she was supposed to be in [place] now but she convinced sister to not go due to 60% infection  rate. Patient said that she has joined groups online for POTS and fibromyalgia and this additional support has been beneficial. Patient said that she passed out 4 times in the past 3 weeks. She said that she has been tired a lot, still having problems with her stomach so not eating a lot. Patient said that other people in group say this time of year exacerbates symptoms. Patient told Probation officer that her brother wants her to talk to her niece about the "birds and bees" because she thinks she likes girls so he feels she is the logical choice. She said that she had a conversation with family about who should be the one but her brother wasn't there. Writer and patient discussed why patient does this. Patient said that she tries to set boundaries and gets "deer in headlights" feeling. Writer validated patient and encouraged patient to revisit this and tell him no and patient said that she felt able to do this. Patient said that her new aide called off today. She said that she stresses about not knowing if they are coming or not and gets ready for them. She said that one girl comes and does the dishes but the dishes are still dirty afterwards and she complains about other clients and the company the "whole" time she is there. Writer validated patient's thoughts and feelings. Writer talked to patient about her OCD thoughts and behaviors and how this is manifesting due to her difficulty giving up control.  Patient said that she had a huge fight with [name] because she never finishes a project she starts before she starts another one. She said that she got upset and started crying and [name] told her she was being a baby and her medication needs to be adjusted. Writer empathized with patient's experience and Probation officer and patient discussed how patient can address this with [facility].    Visit Diagnosis:      ICD-10-CM ICD-9-CM   1. Mixed obsessional thoughts and acts  F42.2 300.3   2. Borderline personality disorder  F60.3 301.83   3. MDD  (major depressive disorder), recurrent episode, moderate  F33.1 296.32   4. Chronic post-traumatic stress disorder (PTSD)  F43.12 309.81       Interventions:  Supportive Psychotherapy  Taught/practiced communication skills (specify skills used):  Interpersonal    Current Treatment Plan   Created/Updated On 08/31/2018   Next Treatment Plan Due 11/29/2018     Consent was previously obtained from the patient to complete this Video consult; including the potential for financial liability.    Time spent on the  Video with patient: 40+ minutes    Length of time compiling the report:15 minutes    Plan:  Psychotherapy continues as described in care plan; plan remains the same.    NEXT APPT: 12/08/18.      Tresa Res, LCSW-R

## 2018-11-21 ENCOUNTER — Other Ambulatory Visit: Payer: Self-pay | Admitting: Pulmonary and Critical Care Medicine

## 2018-11-21 DIAGNOSIS — E1165 Type 2 diabetes mellitus with hyperglycemia: Secondary | ICD-10-CM

## 2018-11-23 NOTE — Telephone Encounter (Signed)
Last appt: 05/14/2018     Next appt:  04/16/2019        Recent Lab Values 01/04/2018 12/17/2017 10/17/2017 10/17/2017 03/31/2017 10/29/2016 10/17/2016   EXP DATE 9/20 02/02/2018 11-03-18 08-04-18 04/04/18 04/04/2017 08/03/17   [facility] - - - - - - -

## 2018-11-24 ENCOUNTER — Encounter: Payer: Self-pay | Admitting: Primary Care

## 2018-11-24 DIAGNOSIS — G90A Postural orthostatic tachycardia syndrome (POTS): Secondary | ICD-10-CM

## 2018-11-24 DIAGNOSIS — I498 Other specified cardiac arrhythmias: Secondary | ICD-10-CM

## 2018-11-25 ENCOUNTER — Encounter: Payer: Self-pay | Admitting: Gastroenterology

## 2018-11-25 ENCOUNTER — Other Ambulatory Visit
Admission: RE | Admit: 2018-11-25 | Discharge: 2018-11-25 | Disposition: A | Discharge status: Home / Self Care | Payer: Medicare (Managed Care) | Source: Ambulatory Visit | Attending: Primary Care | Admitting: Primary Care

## 2018-11-25 DIAGNOSIS — E1165 Type 2 diabetes mellitus with hyperglycemia: Secondary | ICD-10-CM

## 2018-11-25 LAB — COMPREHENSIVE METABOLIC PANEL
ALT: 20 U/L (ref 0–35)
AST: 15 U/L (ref 0–35)
Albumin: 4.7 g/dL (ref 3.5–5.2)
Alk Phos: 107 U/L — ABNORMAL HIGH (ref 35–105)
Anion Gap: 14 (ref 7–16)
Bilirubin,Total: 0.4 mg/dL (ref 0.0–1.2)
CO2: 20 mmol/L (ref 20–28)
Calcium: 9.9 mg/dL (ref 8.8–10.2)
Chloride: 105 mmol/L (ref 96–108)
Creatinine: 0.95 mg/dL (ref 0.51–0.95)
GFR,Black: 84 *
GFR,Caucasian: 73 *
Glucose: 110 mg/dL — ABNORMAL HIGH (ref 60–99)
Lab: 11 mg/dL (ref 6–20)
Potassium: 4.2 mmol/L (ref 3.3–5.1)
Sodium: 139 mmol/L (ref 133–145)
Total Protein: 7.2 g/dL (ref 6.3–7.7)

## 2018-11-25 LAB — MICROALBUMIN, URINE, RANDOM
Creatinine,UR: 67 mg/dL (ref 20–300)
Microalbumin,UR: <1.2 mg/dL

## 2018-11-25 LAB — LIPID PANEL
Chol/HDL Ratio: 4.1
Cholesterol: 146 mg/dL
HDL: 36 mg/dL — ABNORMAL LOW (ref 40–60)
LDL Calculated: 84 mg/dL
Non HDL Cholesterol: 110 mg/dL
Triglycerides: 129 mg/dL

## 2018-11-25 LAB — HEMOGLOBIN A1C: Hemoglobin A1C: 6.7 % — ABNORMAL HIGH

## 2018-11-25 MED ORDER — BLOOD PRESSURE MONITOR/ARM DEVI *A*
0 refills | Status: AC
Start: 2018-11-25 — End: ?

## 2018-11-25 NOTE — Progress Notes (Signed)
The writer called Kaylee Harris to see how she is doing.  Kaylee Harris reports that she is having a difficult time with her PCA services not showing up for their time slot.  Kaylee Harris reports that when an aide does show up she will stay approximately 2 hours and leave out of the 5 hours she is suppose to be there.  Kaylee Harris reports that she called [place] and reported the problem and they haven't called back and nothing has changed.  The writer and Kaylee Harris together called Kaylee Harris [phone] at [place] and spoke to her about the problem with Mom's meals and the aides not showing up for the time that she is allowed.  Kaylee Harris reports that she will have Kaylee Harris call Kaylee Harris to speak to her regarding the problem and put Kaylee Harris on the list for another [place] to pick up her hours where they are more consistent.  The Probation officer then asked about Mom's meals and if Kaylee Harris is able to switch to [employer] Meals instead of Mom's Meals due to receiving meals that are not diabetic friendly.  Kaylee Harris reports that she has been receiving meals that are really high in carbohydrates and fears that her blood sugars are going to go up too much.  Kaylee Harris reports that there is nothing saying that Kaylee Harris is in need of diabetic meals and that Mom[employer] meals probably isn't aware of this.  Kaylee Harris also asked about the blood pressure cuff that was supposed to be ordered for her, and Kaylee Harris reports that the PCP never sent the prescription to them and for Kaylee Harris to call her PCP and follow up on this.  The writer explained to Kaylee Harris that if she needs help with this to just give the Probation officer a call.  The Probation officer then checked medical records and reminded Kaylee Harris of her upcoming appointment on 11/25/18 at 1pm with the spine doctor.  Kaylee Harris thanked the Probation officer.  Kaylee Harris reports that she is in need of nothing else at this time.

## 2018-11-25 NOTE — Telephone Encounter (Signed)
The script has been faxed to [phone].

## 2018-11-25 NOTE — Telephone Encounter (Signed)
Please fax script per pt's mychart     [name], MD  [facility]  11/25/2018  9:55 AM

## 2018-12-02 ENCOUNTER — Encounter: Payer: Self-pay | Admitting: Primary Care

## 2018-12-08 ENCOUNTER — Ambulatory Visit: Payer: Medicare (Managed Care) | Attending: Psychiatry

## 2018-12-08 DIAGNOSIS — F422 Mixed obsessional thoughts and acts: Secondary | ICD-10-CM

## 2018-12-08 DIAGNOSIS — F603 Borderline personality disorder: Secondary | ICD-10-CM

## 2018-12-08 DIAGNOSIS — F331 Major depressive disorder, recurrent, moderate: Secondary | ICD-10-CM

## 2018-12-08 DIAGNOSIS — F4312 Post-traumatic stress disorder, chronic: Secondary | ICD-10-CM | POA: Insufficient documentation

## 2018-12-08 NOTE — Progress Notes (Signed)
Behavioral Health Progress Note   Patient encounter was performed via Video     Patient located at: home    Provider located at: home office    Other participants in this encounter and roles: N/A    Consent was previously obtained from the patient to complete this service via telehealth; including the potential for financial liability.  Length of Session: 45 minutes    Contact Type:  Location: Telemedicine Covid-19    Telephone Contact     Problem(s)/Goals Addressed from Treatment Plan:    Problem 1:   Treatment Problem #1 12/08/2018   Patient Identified Problem OCD       Goal for this problem:    Treatment Goal #1 12/08/2018   Patient Identified Goal Utilize CBT to reduce behaviors       Progress towards this goal: Problem resolving. Comment: Patient reports continuing to work to set boundaries.    Mental Status Exam:  APPEARANCE: Appears stated age, Well-groomed, Casual  ATTITUDE TOWARD INTERVIEWER: Cooperative  MOTOR ACTIVITY: WNL (within normal limits)  EYE CONTACT: Direct  SPEECH: Normal rate and tone  AFFECT: Full Range  MOOD: Anxious, Depressed and Lively  THOUGHT PROCESS: Normal and Circumstantial  THOUGHT CONTENT: No unusual themes  PERCEPTION: No evidence of hallucinations  CURRENT SUICIDAL IDEATION: patient denies  CURRENT HOMICIDAL IDEATION: Patient denies  ORIENTATION: Alert and Oriented X 3.  CONCENTRATION: Good  MEMORY:   Recent: intact   Remote: intact  COGNITIVE FUNCTION: Average intelligence  JUDGMENT: Intact  IMPULSE CONTROL: Fair  INSIGHT: Good and Fair    Risk Assessment:  ASSESSMENT OF RISK FOR SUICIDAL BEHAVIOR  Changes in risk for suicide from baseline Formulation of Risk and/or previous intake, including newly identified risk, if any: none  Violence risk was assessed and No Change noted from baseline formulation of risk and/or previous assessment.    Session Content:: Patient talked about a recent situation with her nieces at her parents. She said that one niece had a melt down - hadn't taken  her medication - brother got on call and "screamed" at niece - then mother gets into it and brings up brother's ex and how she created some of this. Patient said that she felt trapped in the video chat and concerned because she doesn't want her niece to hear these things. Patient said that her sister has been talking to her about being upset that her son is moving to [place], she's not a good mother, etc. She said that then Sherri's father went to the hospital and she knows Venida Jarvis is concerned about this but doesn't really care what happens to him. Patient said that she and Sherri just had a fight prior to her appointment because there was "so much stuff" in the kitchen and she couldn't make her lunch. Writer provided empathetic support. Writer and patient discussed patient's reaction today and patient recognized that it wasn't really about the sandwich. Patient said that she does care about Sherri and what she is going through and wants to support her. Writer and patient discussed patient continuing to work to set boundaries with her family. Patient said that she did tell her parents she wouldn't take the girls today because she has an appointment and writer commended patient on setting this limit. Patient said that she continues to work to address physical issues related to GI and her diabetes. She said that the aide is still not showing up regularly but she discussed using another agency with the foundation. Writer commended patient  on being proactive.    Visit Diagnosis:      ICD-10-CM ICD-9-CM   1. Mixed obsessional thoughts and acts  F42.2 300.3   2. Borderline personality disorder  F60.3 301.83   3. MDD (major depressive disorder), recurrent episode, moderate  F33.1 296.32       Interventions:  Supportive Psychotherapy  Taught/practiced coping skills (specify skills used):  observe limits, set boundaries    Current Treatment Plan   Created/Updated On 12/08/2018   Next Treatment Plan Due 03/09/2019     Consent was  previously obtained from the patient to complete this Video consult; including the potential for financial liability.    Time spent on the  Video with patient: 40+ minutes    Length of time compiling the report:15 minutes    Plan:  Psychotherapy continues as described in care plan; plan remains the same.    NEXT APPT: 12/29/18.      Tresa Res, LCSW-R

## 2018-12-08 NOTE — BH Treatment Plan (Signed)
[facility] Treatment Plan     Date of Plan:   Created/Updated On 12/08/2018   FROM 12/08/2018      Created/Updated On 12/08/2018   Next Treatment Plan Due 03/09/2019       Diagnostic Impression    ICD-10-CM ICD-9-CM   1. Mixed obsessional thoughts and acts  F42.2 300.3   2. Borderline personality disorder  F60.3 301.83   3. MDD (major depressive disorder), recurrent episode, moderate  F33.1 296.32       Strengths  Strengths derived from the assessment include: Patient is hard working, has supports.    Problem Areas  *At least one problem must be targeted toward risk reduction if Formulation of Risk or any other previous exam indicated special monitoring or intervention for suicide and/or violence risk indicated.    PROBLEM AREAS (choose and describe relevant):  THOUGHT: Negative  MOOD:Depressed and anxious  BEHAVIOR: Reactive and avoidant  ECONOMIC:SSDI  ________________________________________________________________  Treatment Problem #1 12/08/2018   Patient Identified Problem OCD        Treatment Goal #1 12/08/2018   Patient Identified Goal Utilize CBT to reduce behaviors       The rationale for addressing this problem is that resolving it will (select all that apply):  Reduce symptoms of disorder, Reduce functional impairment associated with disorder, Decrease likelihood of hospitalization, Facilitate transfer skills learned in therapy to everday life, Is a key motivational factor for the patient's participation in treatment, Reduce risk for suicide* and Reduce risk for violence*      Progress toward goal(s): Problem resolving. Comment: Patient has remained in treatment and working to address issues in her relationships and set clearer boundaries. Patient is not on the Suicide Care Pathway. Patient not available to sign treatment plan due to Covid-19.    1 a. Measurable Objectives : Patient will make and keep regular individual therapy appointments.   Date established: 05/31/14   Target date:  03/09/19.   Attained or Revised? continue    1 b. Measurable Objectives : Patient will continue to learn and utilize skills to manage affect.   Date established: 08/31/18   Target date: 03/09/19.   Attained or Revised? continue    ______________________________________________________________________      Plan  TREATMENT MODALITIES:  Individual psychotherapy for 30-60 min Q 1-3 weeks with Tresa Res, LCSW.    DISCHARGE CRITERIA for this treatment setting: Patient reports a reduction in symptoms as evidenced by PHQ-9 and GAD-7 scores.    Clinician's name: Tresa Res, LCSW    Psychiatrist's Name: Donny Pique, MD      Patient/Family Statement  PATIENT/FAMILY STATEMENT:  Obtain patient and family input into the treatment plan, including areas of agreement / disagreement.  Obtain patient's signature - if not possible, briefly describe the reason.     Patient Comments:          I HAVE PARTICIPATED IN THE DEVELOPMENT OF THIS TREATMENT PLAN AND I AGREE WITH ITS CONTENTS:       Patient Signature: _______________________________________________________    Date: ______________________________

## 2018-12-09 ENCOUNTER — Ambulatory Visit: Payer: Medicare (Managed Care) | Admitting: Registered Nurse

## 2018-12-09 ENCOUNTER — Ambulatory Visit: Payer: Medicare (Managed Care) | Attending: Endocrinology | Admitting: Registered Nurse

## 2018-12-09 ENCOUNTER — Encounter: Payer: Self-pay | Admitting: Registered Nurse

## 2018-12-09 DIAGNOSIS — E10649 Type 1 diabetes mellitus with hypoglycemia without coma: Secondary | ICD-10-CM | POA: Insufficient documentation

## 2018-12-09 NOTE — Progress Notes (Signed)
Kaylee Harris 43 y.o. female was spoke from home today for diabetes education regarding  uncontrolled type 2 diabetes mellitus. Last seen 06/17/2018 for DSMT; now Omnipod DASH pump + DExcom G6 CGM. Post pump + CGM fu today.   Video Visit     Location of Patient: home    Location of Telemedicine Provider: home office    Other participants in telemedicine encounter and roles:  pt & educator    This is an established patient visit.    Reason for visit: diabetes    Consent was obtained from the patient to complete this video visit; including the potential for financial liability.    Recent Patient Data     No data found in the last 1 encounters.          Diabetes Meds: reviewed in eRecord; patient is taking as prescribed. Trulicity 1.5 mg q week;Humalog vial      Lab Results   Component Value Date    HA1C 6.7 (H) 11/25/2018    HA1C 6.8 (H) 04/20/2018    HA1C 7.0 (H) 01/16/2018    MALBR <1.20 11/25/2018    CREAT 0.95 11/25/2018    LDLC 84 11/25/2018     Self glucose monitoring:    BG meter 6 times daily. Last 14  Days average 126;  Trends of 53 - 55-56 esp later afternoon/ 1230 am;  .   BG today:  SG 160 mg/dl; post coffee (not bolused)  Self correction 0.50 units    -- Current management: Omnipod DASH Insulin Pump  -- Insulin pump settings:  -- Basal rates:  12 AM - 0.95 units/hour reduced 0.90 units due to 1230 am hypos  3 AM - 1.45 units/hour  6 AM - 1.20 units/hour   12n to 10 pm 1.10 units/hr ; reduced 1.0 units due to afternoon trends <75range  10 PM - 0.95 units/hour reduced 0.90 units due to nocturnal hypo  --TDD basal insulin: NOW 16.05 units   TDD 45 units   Basal 57% ; bolus 43%  -- Active insulin time: 4 hours  -- Bolus wizard use: yes  -- Insulin:carb ratio: 1:8 grams  -- Sensitivity: 40 mg/dL  -- Target blood glucose: 140 mg/dL  -- Glucose sensor: G6  Low 70; high 250      Diet: 1-2 meals per day ; needs to boluses for coffee (15 gms); IBS ; bloating noted; lack appetite  Smoking: no Alcohol: no rare      Self foot check:  self care  Eye Exam inquiry: done    Assessment:   Describes proper skill with insert PODs for pump   Describes proper skill with insert Dexcom G6 CGM  MET goal to enter all SGS from dexcom  Nocturnal hypo 53; afternoon lowers trends; Reduced basal 10 pm & 19m (0.90) & 12n to 1.0 as SG trends close to 70 range  ? Bloating & lack of appetite due to GLP1?  Will check endo NP re: trial none for 1 wk OR reduced dose  Omnipod pump emergency back up plan established; prefers to use old Omnipod pump & has supplies    Coun/Edu:     BARRIERS THAT AFFECT LEARNING:  Mental health ; followed by psych  READINESS TO LEARN:  good  PREFERRED METHODS OF LEARNING:  reading, demonstration and visual tools  EDUCATION PROVIDED: insulin teaching, BG Goals, using ICR and CF to determine meal bolus and  risk reduction; skin care with PODS; post pump FU  &  personal CGM prob solving    Plan:     Patient Instructions   1.  Bolus for coffee    2.  Evaluate reduced 24m , 12N & 10 pm basal rates     Basal testing    Skip a meal 12n to 5pm ; if rates are ok; then sensor values do not drop <70 or rise > 150;    3  Continue new sites for PODs & cgm    4.  Evaluate bloating ; ask endo NP re? Lower dose       DSMT SUPPORT PLAN:  Take advantage of your annual Medicare benefits for DSMT and MNT  Follow-up visit:  4 mos; teleheath   Time spent counseling patient: 30 minutes    MCR insurance MD follow up referral is in eRecord    Allowed 2 hrs Individual/group DSMT 1/2 hrs remain; expires 05/06/19    SValaria Good RN CDE

## 2018-12-09 NOTE — Patient Instructions (Signed)
1.  Bolus for coffee    2.  Evaluate reduced 69mn , 12N & 10 pm basal rates     Basal testing    Skip a meal 12n to 5pm ; if rates are ok; then sensor values do not drop <70 or rise > 150;    3  Continue new sites for PODs & cgm    4.  Evaluate bloating ; ask endo NP re? Lower dose

## 2018-12-13 ENCOUNTER — Encounter: Payer: Self-pay | Admitting: Physical Medicine and Rehabilitation

## 2018-12-15 ENCOUNTER — Encounter: Payer: Self-pay | Admitting: Psychiatry

## 2018-12-18 ENCOUNTER — Other Ambulatory Visit: Payer: Self-pay | Admitting: Primary Care

## 2018-12-18 DIAGNOSIS — Z760 Encounter for issue of repeat prescription: Secondary | ICD-10-CM

## 2018-12-18 MED ORDER — TRAMADOL HCL 50 MG PO TABS *I*
ORAL_TABLET | ORAL | 0 refills | Status: DC
Start: 2018-12-18 — End: 2019-01-26

## 2018-12-18 NOTE — Progress Notes (Signed)
The Probation officer received a phone call from [name] stating that she recently went to the eye ware provider, [facility], and was told that they no longer accept her insurance.  The writer explained to Kaylee Harris that now that she has [place] that she would need to contact [place] to ask them for a list of providers for eyeware, dental, podiatrist, adiology and physical therapy because Tye Savoy provides the coverage for them.  Anntionette reports that she now understands.  Kaylee Harris then reports that her aide has still not come for her hours for the past 2 weeks now.  The Probation officer and Kaylee Harris together called [employer] to speak to someone regarding changing [employer] plans if they were able to provide her with better aide service that actually shows up weekly.  Kaylee Harris asked [employer] to come to her home and reassess her for hours and possibly to change [facility] plans.  The Probation officer then checked medical records and reminded Kaylee Harris of her upcoming appointment with therapist, Kaylee Harris, on August 25th at [place] thanked the writer for the reminder.  Kaylee Harris reports needing nothing else at this time.

## 2018-12-18 NOTE — Telephone Encounter (Signed)
Last appt: 05/14/2018     Next appt:  04/16/2019        Recent Lab Values 01/04/2018 12/17/2017 10/17/2017 10/17/2017 03/31/2017 10/29/2016 10/17/2016   EXP DATE 9/20 02/02/2018 11-03-18 08-04-18 04/04/18 04/04/2017 08/03/17   [facility] - - - - - - -       Patient Name: Kaylee Harris   Birth Date: November 17, 1975   Address: [place], [place] [address]   Sex: Female   Rx Written Rx Dispensed Drug Quantity Days Supply Prescriber Name Payment Method Dispenser   11/12/2018 11/15/2018 diphenoxylate-atropine 2.5-0.025 mg tablet  240 [address] [place], [place]. #19   10/13/2018 10/15/2018 tramadol hcl 50 mg tablet  42 [address] [place], [place]. #19   08/24/2018 08/27/2018 clonazepam 0.5 mg tablet  28 [address] [place], [place]. #19   08/20/2018 08/21/2018 tramadol hcl 50 mg tablet  42 [address] [place], [place]. #19   07/22/2018 07/22/2018 clonazepam 0.5 mg tablet  28 28 [name], Darnell Level (MD) [place]. #02   05/27/2018 06/02/2018 tramadol hcl 50 mg tablet  42 [address] [place], [place]. #02   03/25/2018 03/25/2018 tramadol hcl 50 mg tablet  42 [address] [place], [place]. #02   01/19/2018 01/22/2018 tramadol hcl 50 mg tablet  42 [address] [place], [place]. #02   01/20/2018 01/22/2018 diphenoxylate-atropine 2.5-0.025 mg tablet  240 30 [place], Almyra Free A ([facility]) Insurance [place]. [phone]

## 2018-12-29 ENCOUNTER — Ambulatory Visit: Payer: Medicare (Managed Care)

## 2018-12-29 DIAGNOSIS — F4312 Post-traumatic stress disorder, chronic: Secondary | ICD-10-CM

## 2018-12-29 DIAGNOSIS — F331 Major depressive disorder, recurrent, moderate: Secondary | ICD-10-CM

## 2018-12-29 DIAGNOSIS — F422 Mixed obsessional thoughts and acts: Secondary | ICD-10-CM

## 2018-12-29 DIAGNOSIS — F603 Borderline personality disorder: Secondary | ICD-10-CM

## 2018-12-29 NOTE — Progress Notes (Signed)
Behavioral Health Progress Note   Patient encounter was performed via Telephone     Patient located at: home    Provider located at: home office    Other participants in this encounter and roles: N/A    Consent was previously obtained from the patient to complete this service via telehealth; including the potential for financial liability.  Length of Session: 45 minutes    Contact Type:  Location: Telemedicine Covid-19    Telephone Contact     Problem(s)/Goals Addressed from Treatment Plan:    Problem 1:   Treatment Problem #1 12/08/2018   Patient Identified Problem OCD       Goal for this problem:    Treatment Goal #1 12/08/2018   Patient Identified Goal Utilize CBT to reduce behaviors       Progress towards this goal: Patient reports an additional stressor.    Mental Status Exam:  APPEARANCE: Not assessed  ATTITUDE TOWARD INTERVIEWER: Cooperative  MOTOR ACTIVITY: Not assessed  EYE CONTACT: not assessed  SPEECH: Normal rate and tone  AFFECT: Not assessed  MOOD: Anxious and Depressed  THOUGHT PROCESS: Circumstantial  THOUGHT CONTENT: Negative Rumination  PERCEPTION: No evidence of hallucinations  CURRENT SUICIDAL IDEATION: patient denies  CURRENT HOMICIDAL IDEATION: Patient denies  ORIENTATION: Alert and Oriented X 3.  CONCENTRATION: Good  MEMORY:   Recent: intact   Remote: intact  COGNITIVE FUNCTION: Average intelligence  JUDGMENT: Intact  IMPULSE CONTROL: Fair  INSIGHT: Fair    Risk Assessment:  ASSESSMENT OF RISK FOR SUICIDAL BEHAVIOR  Changes in risk for suicide from baseline Formulation of Risk and/or previous intake, including newly identified risk, if any: none  Violence risk was assessed and No Change noted from baseline formulation of risk and/or previous assessment.    Session Content:: Patient reports an additional stressor. Patient told Probation officer that [employer] father had a stroke. She said that she has been feeling bad that she thinks he deserves this. Patient said that this is not something she can talk to  [place] about. Writer validated patient's thoughts and feelings. Patient and Probation officer discussed skills patient can utilize to manage her distress, e.g. Stop, Mindfulness. Patient said that her cousin is sending her a laptop and she is looking forward to this. She said that she will keep the workbook to work on skills.    Visit Diagnosis:      ICD-10-CM ICD-9-CM   1. Mixed obsessional thoughts and acts  F42.2 300.3   2. Borderline personality disorder  F60.3 301.83   3. MDD (major depressive disorder), recurrent episode, moderate  F33.1 296.32   4. Chronic post-traumatic stress disorder (PTSD)  F43.12 309.81       Interventions:  Supportive Psychotherapy  Taught/practiced coping skills (specify skills used):  Stop, self care, Mindfulness    Current Treatment Plan   Created/Updated On 12/08/2018   Next Treatment Plan Due 03/09/2019     Consent was previously obtained from the patient to complete this Telephone consult; including the potential for financial liability.    Time spent on the  Telephone with patient: 40+ minutes    Length of time compiling the report:15 minutes    Plan:  Psychotherapy continues as described in care plan; plan remains the same.    NEXT APPT: 01/20/19.      Tresa Res, LCSW-R

## 2019-01-01 ENCOUNTER — Encounter: Payer: Self-pay | Admitting: Psychiatry

## 2019-01-18 ENCOUNTER — Ambulatory Visit: Payer: Medicare (Managed Care) | Admitting: Obstetrics and Gynecology

## 2019-01-18 ENCOUNTER — Telehealth: Payer: Self-pay

## 2019-01-18 ENCOUNTER — Encounter: Payer: Self-pay | Admitting: Obstetrics and Gynecology

## 2019-01-18 VITALS — BP 120/80 | Temp 98.2°F | Ht 67.99 in | Wt 264.6 lb

## 2019-01-18 DIAGNOSIS — N907 Vulvar cyst: Secondary | ICD-10-CM

## 2019-01-18 MED ORDER — CLINDAMYCIN PHOSPHATE 1 % EX SOLN *I*
Freq: Two times a day (BID) | CUTANEOUS | 2 refills | Status: DC
Start: 2019-01-18 — End: 2019-04-19

## 2019-01-18 MED ORDER — CLINDAMYCIN HCL 300 MG PO CAPS *I*
300.0000 mg | ORAL_CAPSULE | Freq: Three times a day (TID) | ORAL | 0 refills | Status: AC
Start: 2019-01-18 — End: 2019-01-23

## 2019-01-18 NOTE — Progress Notes (Signed)
The Probation officer received a phone call from [place].  Kendra reports that she is doing okay and that she is in need of calling and speaking to her Care Manager, Wynelle Link, at [facility].  The Probation officer and Jahara spoke to [place] to find out when her aide services will be starting.  Rosalynd reports that she is happy with [facility].  She is only getting 4 hours a week, but is now getting effort[employer] healthy meals delivered to her and she is very happy due to the meals being more healthy.  The writer found that [facility] is having a hard time filling her aide service.  Gisell did ask [facility] about a dental facility that accepts her insurance.  [facility] gave Selia the phone # of [facility].  The writer checked medical records and reminded Jaisley of her upcoming appointments on 9/16 at 3pm with Therapist, Tresa Res, and on the 17th at 1pm with Endocrinology.  Royalty thanks Careers information officer and reports that she is in need of nothing else at this time.

## 2019-01-18 NOTE — Telephone Encounter (Signed)
T/C from patient. Experiencing vulvar cysts again. Patient states she has had them in the past and has seen Dr. Jeralyn Bennett for them. Patient states she has a number of them this time. States one of the cysts is in the place it was before when she was hospitalized and it is expanding up into her groin.    Patient wonders if she needs antibiotics or if she should come into the office to be seen.    Patient was last seen on 11/18/17 in our office.    Routed to provider to advise.

## 2019-01-18 NOTE — Telephone Encounter (Signed)
Left message on the pt's voice mail to call the nurses' line back, phone number provided.

## 2019-01-18 NOTE — Telephone Encounter (Signed)
On chart view, patient has had multiple vulvar abscess. Would recommend evaluation in office to determine need for I&D versus Abx.    Chipper Oman, MD 01/18/2019 2:02 PM

## 2019-01-18 NOTE — Progress Notes (Signed)
[facility] Gynecology Visit  Location: [facility]     Subjective     Kaylee Harris is a 43 yo G0P0 female with an extensive past medical history most notable for T2DM, obesity and hyperlipidemia that presents with concern for infection of five vulvar cysts.     She reports that over the past 2 weeks she has been concerned that five vulvar cysts have become infected. She reports three cysts in a cluster over her right labia, one over her upper left labia and one over her lower left labia. All of the lesions are tender to touch. She reports they "feel like a bruise," most notably the cluster of three over her right labia. The lower left cyst has been draining over the last two weeks, she does not believe the other four have been draining.     She has a history of vulvar cysts that have developed into vulvar abscesses requiring antibiotics and I&D. She was hospitalized in November 2018 with right vulvar cellulitis requiring IV antibiotics. She was last seen in our office on 11/18/17 for I&D of an abscess on her left labia, subsequently treated with a 7 day course of clindamycin.     The recent concern for infection of the five cysts occurs in the setting of poor control of her blood sugars over the last three weeks after discontinuing Trulicity. She reports her blood sugars have consistently been 250-300 over the last three weeks. Prior to discontinuing Trulicity, she reports good control of her blood sugars. She remains on an insulin pump. Her last HbA1c on 11/25/18 was 6.7,    Patient's medications, allergies, past medical, surgical, obstetrical, gynecologic, social and family histories were reviewed and updated as appropriate.     General ROS: negative   Respiratory ROS: no cough, shortness of breath, or wheezing  Cardiovascular ROS: no chest pain or dyspnea on exertion  Gastrointestinal ROS: no abdominal pain, change in bowel habits, or black or bloody stools  Urinary ROS:  no dysuria, trouble voiding, or hematuria  Musculoskeletal ROS: negative    Objective     BP 120/80    Temp 36.8 C (98.2 F)    Ht 1.727 m (5' 7.99")    Wt 120 kg (264 lb 9.6 oz)    BMI 40.24 kg/m      General: well developed and well nourished, alert and oriented, in no acute distress, obese  CV. Regular rate, well perfused lower extremities  Resp: Normal respiratory effort on RA    Pelvic:   VULVA: 3 discrete 0.5 cm cysts on the right vulva at 11, 9, and 7 o'clock and 2 on the left vulva at 1 and 5 o'clock - all areas swollen and tender to palpation without appreciable fluctuance or drainage noted, no tracking and minimally surrounding erythema    Examination chaperoned by Milinda Antis, MD.    Assessment     Kaylee Harris is an 43 y.o. G0P0000 presenting to the office for concern of vulvar cysts with possible infection vs. need for bedside incision and drainage.     1. Vulvar cyst        Plan     Vulvar cysts without drainable fluid collection.  - No fluctuance or concern for fluid collection and therefore no I&D indicated  - Prescribed clindamycin 300 mg po TID for 5 days and clindamycin 1% external solution to apply topically BID until improvement in cysts.   - Patient advised in conservative measures  such as heat application and warm compresses    Dispo:  She will follow-up in 2 week for a follow-up visit.    Patient seen and discussed with Dr, Otelia Santee, MD  OBGYN Resident PGY-3  Pager [phone]

## 2019-01-18 NOTE — Telephone Encounter (Signed)
Pt scheduled for appt at 3:30 pm

## 2019-01-18 NOTE — Progress Notes (Addendum)
CC: Vulvar abscess     HPI: Kaylee Harris is a 43 yo G0P0 female with an extensive past medical history most notable for T2DM, obesity and hyperlipidemia that presents with concern for infection of five vulvar cysts.     She reports that over the past 2 weeks she has been concerned that five vulvar cysts have become infected. She reports three cysts in a cluster over her right labia, one over her upper left labia and one over her lower left labia. All of the lesions are tender to touch, she reports they "feel like a bruise," most notably the cluster of three over her right labia. The lower left cyst has been draining over the last two weeks, she does not believe the other four have been draining.     She has a history of vulvar cysts that have developed into vulvar abscesses requiring antibiotics and I&D. She was hospitalized in November 2018 with right vulvar cellulitis requiring IV antibiotics. She was last seen in our office on 11/18/17 for I&D of an abscess on her left labia, subsequently treated with a 7 day course of clindamycin.     The recent concern for infection of the five cysts occurs in the setting of poor control of her blood sugars over the last three weeks after discontinuing Trulicity. She reports her blood sugars have consistently been 250-300 over the last three weeks. Prior to discontinuing Trulicity, she reports good control of her blood sugars. She remains on an insulin pump. 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 Lower abdomen with cellulitis changes, wound probed and purulent material expressed.  Had PICC line for "multiple infiltrations" of what? D/c-ed home on 10d of  Augmentin 9/11.      Anginal pain     Anxiety     Arthritis     Asthma     Depression     Diabetes mellitus     Previously on SU and metformin, now diet controlled    Diverticulitis 09/2010    Dysfunctional uterine bleeding     Fibromyalgia     GERD (gastroesophageal reflux disease)     GERD (gastroesophageal reflux disease) 10/04/2016    High blood pressure     Hyperlipidemia     Liver disease     fatty liver     Long term (current) use of insulin, Dermal Adhesed V-Go Insulin Delivery Device 10/04/2016    Migraine     Neuromuscular disorder     POTS (postural orthostatic tachycardia syndrome)     Sebaceous cyst of breast     right axilla    TMJ click, left 0/01/7352    Varicella      Past Surgical History:  Past Surgical History:   Procedure Laterality Date    APPENDECTOMY  2016    arthroscopic shoulder surgery Right 2010    Related to lifting    CARDIAC CATHETERIZATION  09/2011    negative    COLON SURGERY      COSMETIC SURGERY Bilateral 12/12/2017    B/L internal maxillary arteries embolized due to recurrent epistaxis, Dr Brendolyn Patty  Ethelene Browns AND CURETTAGE OF UTERUS  2005, 2007    x2 for menorraghia    LEFT COLECTOMY  01/03/14    Dr Tresa Res    loop recorder  Feb 03 2014    loop recorder removal      PR COLONOSCOPY THRU COLOTOMY N/A 02/16/2016    Procedure: COLONOSCOPY;  Surgeon: Kelly Splinter, MD;  Location: [facility];  Service: GI    PR CYSTOURETHROSCOPY,BIOPSY N/A 10/17/2016    Procedure: CYSTOSCOPY BLADDER ;  Surgeon: Ed Blalock, MD;  Location: [place] [facility];  Service: Urology    PR EDG TRANSORAL BIOPSY SINGLE/MULTIPLE N/A 10/29/2016    Procedure: EGD;  Surgeon: Kelly Splinter, MD;  Location: [place];  Service: GI    TONSILLECTOMY       Medications:  Current Outpatient Medications on File Prior to Visit   Medication Sig Dispense Refill    traMADol (ULTRAM) 50 MG tablet TAKE 1 TABLET BY MOUTH EVERY 8 HOURS AS NEEDED FOR HEAD PAIN AND  ABDOMINAL PAIN. MAXIMUM DAILY DOSE OF 3 TABLETS (150MG) PER DAY 42 tablet 0    blood pressure monitor, automatic with arm cuff Use as directed to monitor blood pressure 1 each 0    Alcohol Swabs (B-D SINGLE USE SWABS REGULAR) PADS USE 1 PAD TOPICALLY TWO TIMES DAILY TO CHECK BLOOD GLUCOSE 100 each 1    diphenoxylate-atropine (LOMOTIL) 2.5-0.025 MG per tablet Take 1-2 tablets by mouth 4 times daily as needed for Diarrhea Max daily dose: 8 tablets Code D 240 tablet 0    generic DME Please dispense one digital BP cuff/monitor to check BP at home daily. Use as directed. 1 each 0    incontinence supply disposable Order Description:  Wear as needed for stool leakage 100 each 3    pantoprazole (PROTONIX) 40 MG EC tablet TAKE 1 TABLET BY MOUTH EVERY DAY, SWALLOW WHOLE. DO NOT BREAK, CHEW OR CRUSH 90 tablet 3    QUEtiapine (SEROQUEL XR) 50 MG 24 hr tablet Take 1 tablet (50 mg total) by mouth nightly Swallow whole. Do not chew, crush, or break. 90 tablet 3    Insulin Disposable Pump (OMNIPOD DASH 5 PACK) MISC By 1 Device no specified route as needed Change every 2 days = 45 pods for 90 days supply = 9 (5packs) 45 each 3    zinc oxide 20 % ointment Apply topically as needed for Dry Skin 30 g 3    clotrimazole (LOTRIMIN) 1 % cream Apply topically 2 times daily 15 g 3    clonazePAM (KLONOPIN) 0.5 MG tablet Take 1 tablet (0.5 mg total) by mouth daily as needed Max daily dose: 0.5 mg 28 tablet 0    TRULICITY 1.5 DG/3.8VF injection pen INJECT THE CONTENTS OF ONE PEN (0.5 ML) INTO THE SKIN EVERY 7 DAYS 6 mL 3    Continuous Blood Gluc Sensor (DEXCOM G6 SENSOR) MISC By 1 Device no specified route as needed 3 each 11    Continuous Blood Gluc Transmit (DEXCOM G6 TRANSMITTER) MISC By 1 Device no specified route as needed 1 each 3    Continuous Blood Gluc Receiver (DEXCOM G6 RECEIVER) DEVI By 1 Device no specified route as needed 1 Device 0    ascorbic acid (VITAMIN C) 500 MG tablet Take 1 tablet (500 mg total) by mouth  daily 90 tablet 3    DULoxetine (CYMBALTA) 30 MG DR capsule TAKE 1 CAPSULE BY MOUTH TWO TIMES DAILY 180 capsule  3    cetirizine (ZYRTEC) 10 MG tablet Take 1 tablet (10 mg total) by mouth daily 90 tablet 1    ascorbic acid (VITAMIN C) 100 MG tablet Take 5 tablets (500 mg total) by mouth daily 100 tablet 3    lamoTRIgine (LAMICTAL) 150 MG tablet Take 1 tablet (150 mg total) by mouth daily 90 tablet 3    busPIRone (BUSPAR) 30 MG tablet Take 1 tablet (30 mg total) by mouth 2 times daily 180 tablet 3    insulin lispro 100 UNIT/ML injection vial Inject as directed up to MDD 100 units 40 mL 11    famciclovir (FAMVIR) 500 MG tablet Take 1 tablet (500 mg total) by mouth 3 times daily as needed 30 tablet 5    topiramate (TOPAMAX) 100 MG tablet Take 1 tablet (100 mg total) by mouth 2 times daily 180 tablet 3    glucagon (BAQSIMI) 3 MG/DOSE nasal powder Inhale one dose (3 mg) into one nostril once as needed for low blood sugar. If no response in 15 min, inhale second dose from new device 2 each 5    promethazine (PHENERGAN) 25 MG tablet Take 1 tablet (25 mg total) by mouth every 4-6 hours as needed 30 tablet 3    fluticasone (FLONASE) 50 MCG/ACT nasal spray SPRAY 1 SPRAY INTO EACH NOSTRIL ONE TIME DAILY 48 g 3    insulin syringe-needle U-100 (BD ULTRAFINE) 31G X 5/16" 0.3 ML HALF-UNIT Use 3 times a day as instructed. 100 each 11    VENTOLIN HFA 108 (90 Base) MCG/ACT inhaler INHALE 1 TO 2 PUFFS BY MOUTH INTO THE LUNGS EVERY 6 HOURS AS NEEDED FOR WHEEZING. SHAKE WELL BEFORE EACH USE. 18 Inhaler 5    Continuous Blood Gluc Receiver (DEXCOM G6 RECEIVER) DEVI By 1 Device no specified route as needed 1 Device 0    blood glucose test strip Test  8 times a day.   Brand name of strips Contour NEXT test strips for linked omnipod pump 250 strip 11    lancets (BAYER MICROLET) Use   8  times per day as instructed for blood glucose testing. 250 each 11    azelastine (OPTIVAR) 0.05 % ophthalmic solution Place 1 drop into both  eyes 2 times daily      SUMAtriptan refill (IMITREX STATDOSE) 6 MG/0.5ML injection INJECT 0.5ML UNDER THE SKIN ONCE AS NEEDED FOR MIGRAINE. MAY REPEAT DOSE ONCE AFTER 1 HOUR 2 Syringe 5    glucagon (GLUCAGEN) 1 MG injection kit Inject 1 mg into the skin once as needed for Low blood sugar   Discard any unused portion. 1 each 5    SUMAtriptan (IMITREX) 50 MG tablet TAKE 1 TABLET BY MOUTH AS NEEDED FOR MIGRAINE, TAKE AT ONSET OF HEADACHE. MAY REPEAT ONCE IN 2 HOURS 9 tablet 5    SUMAVEL DOSEPRO 6 MG/0.5ML needle-free injection INJECT 0.5MLS (6MG TOTAL) UNDER THE SKIN AS NEEDED FOR MIGRAINE MAY REPEAT ONCE AFTER 1 HOUR IF NEEDED  1    atorvastatin (LIPITOR) 40 MG tablet Take 1 tablet (40 mg total) by mouth daily (with dinner) 90 tablet 3    metoprolol (TOPROL-XL) 25 MG 24 hr tablet Take by mouth daily         midodrine (PROAMATINE) 10 MG tablet Take 1 tablet (10 mg total) by mouth 3 times daily 90 tablet 0    Non-System Medication The above patient is followed in our clinic and cannot resume work permanently. 1 each 1  No current facility-administered medications on file prior to visit.        Physical Exam:  General: Well-appearing female in no acute distress.   Head: Normocephalic and atraumatic.   Respiratory: Normal respiratory effort in room air.   Pelvic: I was not present in room for pelvic exam, performed by Dr. Ozzie Hoyle and Dr. Jeralyn Bennett.   Neuro: Alert and oriented x3, normal speech.       Assessment and Plan:  Chalsea Darko is a 43 yo G0P0 female with past medical history notable for T2DM and recurrent vulvar cysts that presents today with concern for infection of five vulvar cysts in the setting of recent poor control of her blood glucose. She is well appearing and non-toxic in the office today with no fever. Per Dr. Ozzie Hoyle, vulvar cysts present on pelvic exam without drainable fluid collection. Will treat with oral and topical antibiotics as below.     Vulvar Cyst   - Prescribed clindamycin 300 mg  po TID for 5 days and clindamycin 1% external solution to apply topically BID until improvement in cysts.   - Advised to follow-up for further evaluation if experiencing persistent or worsening symptoms, or as needed.     Jana Hakim, medical student

## 2019-01-19 ENCOUNTER — Telehealth: Payer: Medicare (Managed Care) | Admitting: Psychiatry

## 2019-01-20 ENCOUNTER — Ambulatory Visit: Payer: Medicare (Managed Care) | Attending: Psychiatry

## 2019-01-20 DIAGNOSIS — F422 Mixed obsessional thoughts and acts: Secondary | ICD-10-CM | POA: Insufficient documentation

## 2019-01-20 DIAGNOSIS — F331 Major depressive disorder, recurrent, moderate: Secondary | ICD-10-CM

## 2019-01-20 DIAGNOSIS — F603 Borderline personality disorder: Secondary | ICD-10-CM | POA: Insufficient documentation

## 2019-01-20 DIAGNOSIS — F4312 Post-traumatic stress disorder, chronic: Secondary | ICD-10-CM

## 2019-01-20 NOTE — Progress Notes (Signed)
Behavioral Health Progress Note   Patient encounter was performed via Telephone     Patient located at: home    Provider located at: home office    Other participants in this encounter and roles: N/A    Consent was previously obtained from the patient to complete this service via telehealth; including the potential for financial liability.  Length of Session: 45 minutes    Contact Type:  Location: Telemedicine Covid-19    Telephone Contact     Problem(s)/Goals Addressed from Treatment Plan:    Problem 1:   Treatment Problem #1 12/08/2018   Patient Identified Problem OCD       Goal for this problem:    Treatment Goal #1 12/08/2018   Patient Identified Goal Utilize CBT to reduce behaviors       Progress towards this goal: Problem resolving. Comment: Patient reports confronting a fear.    Mental Status Exam:  APPEARANCE: Appears stated age, Well-groomed, Casual  ATTITUDE TOWARD INTERVIEWER: Cooperative  MOTOR ACTIVITY: WNL (within normal limits)  EYE CONTACT: Direct  SPEECH: Normal rate and tone  AFFECT: Full Range  MOOD: Anxious  THOUGHT PROCESS: Circumstantial  THOUGHT CONTENT: No unusual themes  PERCEPTION: No evidence of hallucinations  CURRENT SUICIDAL IDEATION: patient denies  CURRENT HOMICIDAL IDEATION: Patient denies  ORIENTATION: Alert and Oriented X 3.  CONCENTRATION: Good  MEMORY:   Recent: intact   Remote: intact  COGNITIVE FUNCTION: Average intelligence  JUDGMENT: Intact  IMPULSE CONTROL: Good and Fair  INSIGHT: Fair    Risk Assessment:  ASSESSMENT OF RISK FOR SUICIDAL BEHAVIOR  Changes in risk for suicide from baseline Formulation of Risk and/or previous intake, including newly identified risk, if any: none  Violence risk was assessed and No Change noted from baseline formulation of risk and/or previous assessment.    Session Content:: Patient reports confronting a fear. Patient talked about her father and said that he is getting worse with his breathing although he has not had any other hospitalizations,  which is good. She said that she changed her aide service - getting better meals but still no aide. Patient said that her sugar has been high and her provider is still working to address this. She said that she goes out sometimes, not often - still going to [facility], into a doctor's office. Patient told Probation officer about going to the OBGYN for a cyst on her labia. She said that the tech who checked her in asked if a student could come in and she said yes. She said that the student came in and it was a female who asked her a lot of questions, then left, then came back in with another doctor who was a resident, female, didn't know how to ask him to leave - then another doctor came in because the other was only a resident - didn't know how to ask the female to leave - did finally manage to ask if all three had to be there and he left. Patient said that she then said to the resident and doctor she was uncomfortable with males and they told her she should have spoken up. She said that she told a friend who told her later that "students have to learn somehow" and she was in "lala land" the rest of the day and still thinking about it today. Writer validated patient's thoughts and feelings. Writer and patient discussed patient's trauma history in relation to her reaction. Writer asked patient if she thought she could address this via  MyChart with the doctor as the tech should have given her more information and this is a training issue and patient said that she could. Patient told Probation officer that [employer] mother is having a surprise birthday party for her father. She said that she is not going but did not say this directly to her. Patient told Probation officer that today she went out and confronted the woman she had an affair with several years ago that keeps walking by their house w/dog and driving [name] "crazy". She said that the woman ignored her. Patient said that this brought up emotions, feelings of guilt, shame - "what did I do" and  berating herself for almost leaving [name] for her. Writer commended patient on confronting this issues. Writer and patient discussed patient talking to [name] about this and patient said that she would do this after our session.    Visit Diagnosis:      ICD-10-CM ICD-9-CM   1. Mixed obsessional thoughts and acts  F42.2 300.3   2. Borderline personality disorder  F60.3 301.83   3. MDD (major depressive disorder), recurrent episode, moderate  F33.1 296.32   4. Chronic post-traumatic stress disorder (PTSD)  F43.12 309.81       Interventions:  Supportive Psychotherapy  Taught/practiced coping skills (specify skills used):  Observe limits, Mindfulness, maintain boundaries  Taught/practiced communication skills (specify skills used):  Interpersonal    Current Treatment Plan   Created/Updated On 12/08/2018   Next Treatment Plan Due 03/09/2019     Consent was previously obtained from the patient to complete this Video consult; including the potential for financial liability.    Time spent on the  Video with patient: 40+ minutes    Length of time compiling the report:15 minutes    Plan:  Psychotherapy continues as described in care plan; plan remains the same.    NEXT APPT: 02/10/19.      Tresa Res, LCSW-R

## 2019-01-21 ENCOUNTER — Encounter: Payer: Self-pay | Admitting: Psychiatry

## 2019-01-21 ENCOUNTER — Ambulatory Visit: Payer: Medicare (Managed Care) | Admitting: Psychiatry

## 2019-01-21 DIAGNOSIS — E1165 Type 2 diabetes mellitus with hyperglycemia: Secondary | ICD-10-CM

## 2019-01-21 MED ORDER — DULAGLUTIDE 0.75 MG/0.5ML SC SOAJ *I*
0.7500 mg | SUBCUTANEOUS | 5 refills | Status: DC
Start: 2019-01-21 — End: 2019-06-09

## 2019-01-22 ENCOUNTER — Telehealth: Payer: Self-pay

## 2019-01-22 NOTE — Telephone Encounter (Addendum)
Left voice message for patient  To call and schedule appointment to have form filled out      Received   CDPAS   form from [facility] to review and Doctor to [place] out and sign Placed in [place]. Please fax completed form to  [phone]

## 2019-01-22 NOTE — Telephone Encounter (Signed)
Form is in patient pick up folder once she comes in for appointment

## 2019-01-24 ENCOUNTER — Other Ambulatory Visit: Payer: Self-pay | Admitting: Primary Care

## 2019-01-24 DIAGNOSIS — Z760 Encounter for issue of repeat prescription: Secondary | ICD-10-CM

## 2019-01-25 NOTE — Telephone Encounter (Signed)
The patient is scheduled for 02/09/19 with Graceann Congress, NP. I have placed the forms in Brandi's office.

## 2019-01-26 ENCOUNTER — Encounter: Payer: Self-pay | Admitting: Primary Care

## 2019-01-26 ENCOUNTER — Other Ambulatory Visit: Payer: Self-pay | Admitting: Primary Care

## 2019-01-26 DIAGNOSIS — Z760 Encounter for issue of repeat prescription: Secondary | ICD-10-CM

## 2019-01-26 NOTE — Telephone Encounter (Signed)
Last appt: 05/14/2018     Next appt:  02/09/2019        Recent Lab Values 01/04/2018 12/17/2017 10/17/2017 10/17/2017 03/31/2017 10/29/2016 10/17/2016   EXP DATE 9/20 02/02/2018 11-03-18 08-04-18 04/04/18 04/04/2017 08/03/17   [facility] - - - - - - -       Patient Name: Kaylee Harris   Birth Date: [birth year]   Address: [place], [place] [address]   Sex: Female   Rx Written Rx Dispensed Drug Quantity Days Supply Prescriber Name Payment Method Dispenser   12/18/2018 12/21/2018 tramadol hcl 50 mg tablet  42 14 [name], Darnell Level (MD) [place]. #19   11/12/2018 11/15/2018 diphenoxylate-atropine 2.5-0.025 mg tablet  240 30 [place], [place]. #19   10/13/2018 10/15/2018 tramadol hcl 50 mg tablet  42 14 Burke, [place]. #19   08/24/2018 08/27/2018 clonazepam 0.5 mg tablet  28 28 [place], [place] #19   08/20/2018 08/21/2018 tramadol hcl 50 mg tablet  42 14 [place], [place]. #19   07/22/2018 07/22/2018 clonazepam 0.5 mg tablet  28 28 [name], Darnell Level (MD) [place]. #02   05/27/2018 06/02/2018 tramadol hcl 50 mg tablet  42 14 [place], [place]. #02   03/25/2018 03/25/2018 tramadol hcl 50 mg tablet  42 14 [place], [place]. [employer]

## 2019-01-26 NOTE — Telephone Encounter (Signed)
Last appt: 05/14/2018     Next appt:  01/24/2019        Recent Lab Values 01/04/2018 12/17/2017 10/17/2017 10/17/2017 03/31/2017 10/29/2016 10/17/2016   EXP DATE 9/20 02/02/2018 11-03-18 08-04-18 04/04/18 04/04/2017 08/03/17   [facility] - - - - - - -       Patient Name: Kaylee Harris   Birth Date: [birth year]   Address: [place], [place] [address]   Sex: Female   Rx Written Rx Dispensed Drug Quantity Days Supply Prescriber Name Payment Method Dispenser   12/18/2018 12/21/2018 tramadol hcl 50 mg tablet  42 14 [name], Darnell Level (MD) [place]. #19   11/12/2018 11/15/2018 diphenoxylate-atropine 2.5-0.025 mg tablet  240 30 [place], [place]. #19   10/13/2018 10/15/2018 tramadol hcl 50 mg tablet  42 14 Burke, [place]. #19   08/24/2018 08/27/2018 clonazepam 0.5 mg tablet  28 28 [place], [place] #19   08/20/2018 08/21/2018 tramadol hcl 50 mg tablet  42 14 [place], [place]. #19   07/22/2018 07/22/2018 clonazepam 0.5 mg tablet  28 28 [name], Darnell Level (MD) [place]. #02   05/27/2018 06/02/2018 tramadol hcl 50 mg tablet  42 14 [place], [place]. #02   03/25/2018 03/25/2018 tramadol hcl 50 mg tablet  42 14 [place], [place]. [employer]

## 2019-01-26 NOTE — Telephone Encounter (Signed)
Last appt: 05/14/2018     Next appt:  02/09/2019        Recent Lab Values 01/04/2018 12/17/2017 10/17/2017 10/17/2017 03/31/2017 10/29/2016 10/17/2016   EXP DATE 9/20 02/02/2018 11-03-18 08-04-18 04/04/18 04/04/2017 08/03/17   [facility] - - - - - - -         [name]   Reference #: [id number]   Others' Prescriptions  Patient Name: Kaylee Harris   Birth Date: [birth year]   Address: [place], [place] [address]   Sex: Female   Rx Written Rx Dispensed Drug Quantity Days Supply Prescriber Name Payment Method Dispenser   12/18/2018 12/21/2018 tramadol hcl 50 mg tablet  42 14 [name], Darnell Level (MD) [place]. #19   11/12/2018 11/15/2018 diphenoxylate-atropine 2.5-0.025 mg tablet  240 30 Pauline Aus, [place]. #19   10/13/2018 10/15/2018 tramadol hcl 50 mg tablet  42 14 Burke, [place]. #19   08/24/2018 08/27/2018 clonazepam 0.5 mg tablet  28 28 [place], [place]. #19   08/20/2018 08/21/2018 tramadol hcl 50 mg tablet  42 14 [place], [place]. #19   07/22/2018 07/22/2018 clonazepam 0.5 mg tablet  28 28 [name], Darnell Level (MD) [place]. #02   05/27/2018 06/02/2018 tramadol hcl 50 mg tablet  42 14 [place], [place]. #02   03/25/2018 03/25/2018 tramadol hcl 50 mg tablet  42 14 [place], [place]. [employer]

## 2019-01-28 NOTE — Telephone Encounter (Signed)
Sent 9/22

## 2019-02-01 ENCOUNTER — Ambulatory Visit: Payer: Medicare (Managed Care) | Attending: Obstetrics and Gynecology | Admitting: Obstetrics and Gynecology

## 2019-02-01 ENCOUNTER — Other Ambulatory Visit: Payer: Self-pay | Admitting: Student in an Organized Health Care Education/Training Program

## 2019-02-01 ENCOUNTER — Encounter: Payer: Self-pay | Admitting: Obstetrics and Gynecology

## 2019-02-01 VITALS — BP 122/80 | Temp 97.9°F | Wt 264.4 lb

## 2019-02-01 DIAGNOSIS — L732 Hidradenitis suppurativa: Secondary | ICD-10-CM | POA: Insufficient documentation

## 2019-02-01 MED ORDER — SPIRONOLACTONE 25 MG PO TABS *I*
25.0000 mg | ORAL_TABLET | Freq: Every day | ORAL | 3 refills | Status: DC
Start: 2019-02-01 — End: 2019-02-01

## 2019-02-01 MED ORDER — DOXYCYCLINE MONOHYDRATE 100 MG PO CAPS *I*
100.0000 mg | ORAL_CAPSULE | Freq: Two times a day (BID) | ORAL | 0 refills | Status: DC
Start: 2019-02-01 — End: 2019-02-01

## 2019-02-01 MED ORDER — SPIRONOLACTONE 25 MG PO TABS *I*
25.0000 mg | ORAL_TABLET | Freq: Every day | ORAL | 3 refills | Status: DC
Start: 2019-02-01 — End: 2019-07-01

## 2019-02-01 MED ORDER — DOXYCYCLINE MONOHYDRATE 100 MG PO CAPS *I*
100.0000 mg | ORAL_CAPSULE | Freq: Two times a day (BID) | ORAL | 0 refills | Status: AC
Start: 2019-02-01 — End: 2019-02-15

## 2019-02-01 NOTE — Progress Notes (Signed)
[place] [facility] Visit  Location: [facility]     Subjective     Kaylee Harris is a 44 y.o. G0P0000 who presents as a follow visit regarding labial lesions. She was recently treated with PO and topical clinda. Has not noticed any improvement in her symptoms since then. Lesions are still tender. Noticed some drainage from one of the left-sided lesions but this stopped. Has recently noticed new lesions in her axilla and on her thigh. She is very frustrated that these skin lesions are not improving.    Patient's medications, allergies, past medical, surgical, obstetrical, gynecologic, social and family histories were reviewed and updated as appropriate.    General ROS: negative   Respiratory ROS: negative  Cardiovascular ROS: negative  Gastrointestinal ROS: negative  Urinary ROS: negative  Musculoskeletal ROS: negative    Objective     BP 122/80    Temp 36.6 C (97.9 F) (Temporal)    Wt 119.9 kg (264 lb 6.4 oz)    LMP  (LMP Unknown)    BMI 40.21 kg/m      General: well developed and well nourished, alert and oriented, in no acute distress  HEENT: normocephalic, atraumatic  Respiratory: normal respiratory effort  Vulva: 5 distinct lesions on the labia majora (two on R, three on L), all <1cm in diameter, none with any areas of fluctuance, no surrounding erythema/edema, no active drainage, mild tenderness to palpation      Assessment     Kaylee Harris is an 43 y.o. G0P0000 presenting to the office for follow up visit for labial lesions/hidradenitis    1. Hidradenitis suppurativa  AMB REFERRAL TO DERMATOLOGY    doxycycline monohydrate (MONODOX) 100 MG capsule    Aerobic culture    Aerobic culture    DISCONTINUED: spironolactone (ALDACTONE) 25 MG tablet         Plan     Hidradenitis suppurativa:  - No lesions that would benefit from I&D  - Aerobic culture sent from L labial lesion that was recently draining  - PO doxy 100mg  BID x14 days  - Spironolactone 25mg  daily  -  Referral to dermatology  - Continue topical clinda and warm compresses    Dispo:  She will follow-up in ~1 month after dermatology appointment or as needed if symptoms worsen    Freeman Caldron, MD 02/01/2019 5:33 PM

## 2019-02-01 NOTE — Progress Notes (Deleted)
Dr. Jimmye Norman notified writer noted Aerobic Culture order in chart when printing GBS order .  No urine collected or found to send .Marland Kitchen

## 2019-02-02 ENCOUNTER — Encounter: Payer: Self-pay | Admitting: Obstetrics and Gynecology

## 2019-02-02 LAB — AEROBIC CULTURE

## 2019-02-03 ENCOUNTER — Ambulatory Visit: Payer: Medicare (Managed Care) | Admitting: Nutrition

## 2019-02-03 ENCOUNTER — Telehealth: Payer: Self-pay | Admitting: Obstetrics and Gynecology

## 2019-02-03 ENCOUNTER — Ambulatory Visit: Payer: Medicare (Managed Care) | Attending: Nutrition | Admitting: Nutrition

## 2019-02-03 DIAGNOSIS — Z794 Long term (current) use of insulin: Secondary | ICD-10-CM | POA: Insufficient documentation

## 2019-02-03 DIAGNOSIS — E119 Type 2 diabetes mellitus without complications: Secondary | ICD-10-CM

## 2019-02-03 NOTE — Telephone Encounter (Signed)
It was supposed to be an aerobic culture of a left labial lesion- I'm not sure how the source got listed as "urine". Is the lab going to run it?

## 2019-02-03 NOTE — Telephone Encounter (Signed)
T/c to lab. Lab received e swab labeled with source "urine." Sample was placed in "problem bucket."     Micro department has sample. Advised source was a labial lesion. Will run aerobic culture.    Routed to provider to advise. Will notify patient as well.

## 2019-02-03 NOTE — Telephone Encounter (Signed)
Forwarding message to Dr. Waynard Edwards for review.    Kaylee Harris. Kaylee Harris   [facility], MSN  02/03/19  1:12 PM

## 2019-02-03 NOTE — Telephone Encounter (Signed)
Looks like Dr. Waynard Edwards did a wound culture at the last visit.  I don't see where a urine culture was collected.  Can we call the lab and see why they cancelled it?    Junita Push. Gwenlyn Perking   [facility], MSN  02/03/19  12:11 PM

## 2019-02-03 NOTE — Telephone Encounter (Signed)
My Chart message sent

## 2019-02-03 NOTE — Progress Notes (Signed)
Kaylee Harris called today for Kaylee Harris regarding Insulin requiring Type 2 Diabetes. Referred by: PCP; FOLLOWED BY ENDOCRINOLOGY    Telephone Visit due to COVID 19. Audio visit; Patient does not have video capability    This is an established patient visit.    Reason for visit: diabetes  The plan was discussed with the patient and the patient/patient rep demonstrated understanding to the provider's satisfaction.    Consent was previously obtained from the patient to complete this telephone consult; including the potential for financial liability.    21+ minutes was spent on the phone with the patient, patient representatives, and/or other attendees.     SUBJECTIVE  restarted Trulicity lower dose x 2 weeks- no GI side effects     Omni pod + Dexcom  Going well  On Trulicity meds covered by [facility] Readings from Last 3 Encounters:   02/01/19 119.9 kg (264 lb 6.4 oz)   01/18/19 120 kg (264 lb 9.6 oz)   10/29/18 124.3 kg (274 lb)     Lab Results   Component Value Date    HA1C 6.7 (H) 11/25/2018    HA1C 6.8 (H) 04/20/2018    HA1C 7.0 (H) 01/16/2018    MALBR <1.20 11/25/2018    CREAT 0.95 11/25/2018    LDLC 84 11/25/2018       Diet: 2 meals + grazing on snacks;    Beverages: Powerade Zero; water, coffee, creamer   ETOH: no  Exercise : sedentary  ; dizzy spells    Meter: dexcom 6 report reviewed      Assessment:   DM- excellent DM control with 95% BG in range, 0% hypo avg= 159 predicted A1c= 6.9  Reviewed low cal sm meal options for B/L as appetite is low     Obesity down 25 lb over 18 mo  Exercise very limited    Coun/Edu:     Barriers to learning:  Neuro problems  Education Provided: Plate Method label reading and carb factors    Plan:   Nutrition Goals:   1500 calories  150-180 gm carbs  Initial Weight goal: (-10%) 270 lb    Follow-up visit: 4 mo   Time spent counseling patient: 30 minutes    MCR insurance MD follow up referral is in eRecord  .  MNT 1. hrs remain   Expires  05/06/19    Telephone Visit due to [place] 19.  Audio visit; Patient does not have video capability    This is an established patient visit.    Reason for visit: diabetes  The plan was discussed with the patient and the patient/patient rep demonstrated understanding to the provider's satisfaction.    Consent was previously obtained from the patient to complete this telephone consult; including the potential for financial liability.    21+ minutes was spent on the phone with the patient, patient representatives, and/or other attendees.     Marjo Bicker

## 2019-02-06 LAB — AEROBIC CULTURE

## 2019-02-09 ENCOUNTER — Ambulatory Visit: Payer: Medicare (Managed Care) | Admitting: Internal Medicine

## 2019-02-09 NOTE — Telephone Encounter (Signed)
Patient no showed appointment, Forms are in the patient pick up folder

## 2019-02-10 ENCOUNTER — Ambulatory Visit: Payer: Medicare (Managed Care) | Attending: Psychiatry

## 2019-02-10 DIAGNOSIS — F422 Mixed obsessional thoughts and acts: Secondary | ICD-10-CM | POA: Insufficient documentation

## 2019-02-10 DIAGNOSIS — F603 Borderline personality disorder: Secondary | ICD-10-CM

## 2019-02-10 DIAGNOSIS — F4312 Post-traumatic stress disorder, chronic: Secondary | ICD-10-CM | POA: Insufficient documentation

## 2019-02-10 DIAGNOSIS — F331 Major depressive disorder, recurrent, moderate: Secondary | ICD-10-CM | POA: Insufficient documentation

## 2019-02-10 NOTE — Progress Notes (Signed)
[facility]  ADULT AMBULATORY GROUP THERAPY SERVICE REFERRAL     Identifying Data:  Patient: [place] Record Number: [id number]  DOB: [birth year]  Address: [address]  [place] [address]     Patient Phone: [phone] (home)     Insurance: Payor: Leisure centre manager CHOICE / Plan: [place] / Product Type: *No Product type* /        Check where patient is currently open in the system:     [x]   [facility]     []   [place]     []   [place]    []   [facility]     []   [facility]    []   [place] [facility]     []   [facility]     Diagnosis:     ICD-10-CM ICD-9-CM   1. Mixed obsessional thoughts and acts  F42.2 300.3   2. Borderline personality disorder  F60.3 301.83   3. MDD (major depressive disorder), recurrent episode, moderate  F33.1 296.32       History:  Was patient HOSPITALIZED within the last three months? No    Past PSYCHIATRIC HISTORY: Inpatient, Outpatient  Previous GROUP EXPERIENCE: Yes       Risk Factors:  Suicide or self-mutilation: Past  Violence: None  Alcohol Use: Past  Drug Use: None    Goals for Group Treatment:  Goal #1: Engage in trauma group to manage PTSD  Goal #2:      Adult Group Therapy Offerings (Please check preferential group and time):    [facility] Adult General    []  Depression Management: Mon. 4:00-5:30  []  Anxiety Management:  Tues. 2:30-4:00  []  Interpersonal Therapy for Intimate Partner Violence (HEAL):  Wed. 3:00-4:30  []  Chronic Pain Management: Thur. 1:00-2:30   [x]  Women Trauma Group: Thurs. 3:00-4:00  []  Brief Focused Group ( Interpersonal Process Group): Thurs. 5:00-6:00    DBT Modules    []  Distress Tolerance Skills Training:    []  Mon. 1:00-2:30 []  Mon. 5:30-7:00  []  Tues. 11:00-12:30  []  Tues.   6:30-8:00 []  Wed. 1:00-2:30 []  Wed. 4:00-5:30 []  Wed. 5:30-7:00  []  Thurs.3:00-4:30     []  Emotional Regulation Skills Training:    []  Mon. 1:00-2:30 []  Mon. 5:30-7:00 []  Tues.  11:00-12:30  []  Tues.   6:30-8:00  []  Wed. 1:00-2:30 []  Wed. 4:00-5:30 []  Wed.5:30-7:00 []  Thurs. 3:00-4:30    []  Interpersonal Skills Training:    []  Mon. 1:00-2:30 []  Mon. 5:30-7:00 []  Tues. 11:00-12:30  []  Tues.   6:30-8:00  []  Wed. 1:00-2:30 []  Wed. 4:00-5:30  []  Wed.5:30-7:00 []  Thurs. 3:00-4:30    []  DBT Advanced Skills Training (must have completed [facility] DBT rotation): Tues. 6:30-8:00    []  Other      [facility]   Spanish speaking Patients  ? CBT Brief Focus Group:  Monday 1:45-3:00   ? CBT Brief Focus Group:  Wednesday 2:15-3:30   ? CBT for Beginners Depression Group:  Wednesday 9:45 -11:00   ? Advanced CBT Depression Group:  Wednesday 10:45-12:00    [facility]  []  Anxiety Management   []  Listening Skills  []  DBT (Emotion Regulation, Interpersonal Skills, Distress Tolerance)  []  CBT for Depression/Anxiety Management (SPANISH ONLY)  []  Symptom Management for Psychosis  []  Complicated Grief  []  Relaxation  []  Mental Illness/Chemical Addiction  []  Survivors of Trauma  []  Transition Group  Comments:  Is the patient receptive to this referral? Yes  Will you be involved in the patient's continued care? Yes  Does patient have an individual therapist? Yes    Name of Therapist: Tresa Res    Is he/she in support of this referral? Yes      Referral Processing Information:  The above information must be completed in full for referral to be processed.    Please send completed form to Herbie Drape, Psychiatry via [facility] for [facility].  Please send completed form to Nicolette Bang, Psychiatry via [facility] for [place].
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
## 2019-02-11 ENCOUNTER — Telehealth: Payer: Self-pay

## 2019-02-11 NOTE — Telephone Encounter (Signed)
Writer spoke briefly to PT, PT asked for a later call back.  Writer will make the attempt this afternoon.

## 2019-02-11 NOTE — Telephone Encounter (Signed)
Writer  called PT back as agreed upon from this morning regarding the [place] referral received from [facility].  Writer left a VM for PT to return the call.  When PT calls back Writer will offer PT a group screen with Christin Fudge of [facility].

## 2019-02-11 NOTE — Telephone Encounter (Signed)
I called the patient to reschedule her appointment. At the time of the call the patient was asleep. She will call us back.

## 2019-02-12 ENCOUNTER — Other Ambulatory Visit: Payer: Self-pay

## 2019-02-12 DIAGNOSIS — Z9109 Other allergy status, other than to drugs and biological substances: Secondary | ICD-10-CM

## 2019-02-12 NOTE — Telephone Encounter (Signed)
Last appt: 05/14/2018     Next appt:  04/16/2019        Recent Lab Values 01/04/2018 12/17/2017 10/17/2017 10/17/2017 03/31/2017 10/29/2016 10/17/2016   EXP DATE 9/20 02/02/2018 11-03-18 08-04-18 04/04/18 04/04/2017 08/03/17   [facility] - - - - - - -

## 2019-02-13 MED ORDER — CETIRIZINE HCL 10 MG PO TABS *I*
10.0000 mg | ORAL_TABLET | Freq: Every day | ORAL | 1 refills | Status: DC
Start: 2019-02-13 — End: 2019-05-13

## 2019-02-14 ENCOUNTER — Other Ambulatory Visit: Payer: Self-pay | Admitting: Pulmonary and Critical Care Medicine

## 2019-02-15 ENCOUNTER — Ambulatory Visit: Payer: Medicare (Managed Care) | Admitting: Internal Medicine

## 2019-02-15 ENCOUNTER — Encounter: Payer: Self-pay | Admitting: Primary Care

## 2019-02-15 ENCOUNTER — Encounter: Payer: Self-pay | Admitting: Internal Medicine

## 2019-02-15 VITALS — BP 120/70 | HR 72 | Temp 97.7°F | Ht 67.99 in | Wt 263.0 lb

## 2019-02-15 DIAGNOSIS — L989 Disorder of the skin and subcutaneous tissue, unspecified: Secondary | ICD-10-CM

## 2019-02-15 DIAGNOSIS — E1165 Type 2 diabetes mellitus with hyperglycemia: Secondary | ICD-10-CM

## 2019-02-15 DIAGNOSIS — L301 Dyshidrosis [pompholyx]: Secondary | ICD-10-CM

## 2019-02-15 MED ORDER — QUETIAPINE FUMARATE 50 MG PO TB24 *I*
50.0000 mg | ORAL_TABLET | Freq: Every evening | ORAL | 3 refills | Status: DC
Start: 2019-02-15 — End: 2020-04-21

## 2019-02-15 NOTE — Telephone Encounter (Signed)
Patient is scheduled   

## 2019-02-15 NOTE — Progress Notes (Signed)
SUBJECTIVE    Kaylee Harris is a 43 y.o. female who presents for Follow-up (left foot pain )    43 year old white female presents with left foot lesion, diabetes mellitus, dyshidrotic eczema    Regarding left foot lesion, a few days ago a piece of drywall landed on medial left ankle.  Slight bruising.  Subjectively this is getting better.    However she subsequently noted what is a very small area of erythema on the left instep.  Tender to touch, so much so that she's been walking in a manner that avoids putting any pressure on the instep.    Regarding diabetes mellitus patient is on the insulin pump and Trulicity with excellent results-she's gone which she self describes as hemoglobin A1c's in the 12's to one of 6.7 2 months ago    Regarding dyshidrotic eczema, she notes a dryness to her hands over the past few months.  She's been applying some Neosporin to areas of cracking.  She is using Lubriderm without success    Past Medical, Family and Social History  Patient's medications, allergies, past medical, surgical, social and family histories were updated and marked as reviewed, as appropriate.    Review of Systems   Skin: Positive for rash.         OBJECTIVE    BP 120/70    Pulse 72    Temp 36.5 C (97.7 F) (Temporal)    Ht 1.727 m (5' 7.99")    Wt 119.3 kg (263 lb)    LMP  (LMP Unknown)    SpO2 100%    BMI 40.00 kg/m     VITALS: reviewed    Physical Exam    On exam she is overweight for height, but otherwise friendly, well-appearing, no evident distress.  Nurse vitals noted above    Exam of the left foot in its entirety reveals the Achilles to be intact and nontender.  The medial and lateral malleoli are nontender.  The grooves are nontender.  The plantar fascia is nontender.  There is a minor area of fading bruise just above the medial malleolus.  It is not particularly tender.    Separately there is a 3-5 mm area of erythema, with a slight cystic feel, that is focally and reproducibly tender to palpation.   This overlays the medial instep.  No signs of puncture wound.  No signs of spider bite or bug bite.  She has good sensation throughout the foot    Separately exam of the hands reveals severe dyshidrosis, on both the extensor surface and the palms of the hands.  There is some minor cracking over the knuckles, but no signs of superinfection  ASSESSMENT & PLAN    1. Foot lesion    2. Uncontrolled type 2 diabetes mellitus with hyperglycemia    3. Dyshidrotic eczema      Medication precautions reviewed.      Clinical impression is that of a small foot lesion.  I don't see any sign of an entry point that would go along with a bug bite, spider bite or some other insult-without what looks like.  It so very small, that I think we can start with some topical Neosporin for some absorption, and simple heat application.  However strong precautions are provided against any spread of this-in this diabetic we certainly don't want any further infection.  She is understanding and appreciative.  She is also just finishing a course of doxycycline for a pubic cyst.  She has  an additional day to go on that tomorrow.    On exam this is all very superficial.  There is no sense of any deep penetration.    Clinical impression is that of well-controlled diabetes mellitus.  No change in medication, no intervention from me is required    Clinical impression is that of dyshidrotic eczema.  I think she needs a better lotion-alternatives inclusive of Neutrogena, Cetaphil, and Eucerin are written down for her.    She is appreciative of all.    She will stay in touch with the office over this week.  If the foot lesion starts looking worse in any way then empiric broad-spectrum antibiotics would be an appropriate consideration and she will [place] telephone in for same    She otherwise will stay in touch with Dr. Laurance Flatten regarding any progression current symptoms or onset of new symptoms    Dictation not checked for errors    Encouraged follow up by  phone/message if needed within a week.

## 2019-02-15 NOTE — Patient Instructions (Signed)
Good lotions:    Eucerin  Neutragena  Cetaphil

## 2019-02-15 NOTE — Progress Notes (Signed)
Endocrinology, Diabetes, and Metabolism [facility] for Diabetes.    Video Visit     Location of Patient: home    Location of Telemedicine Provider: home office    Other participants in telemedicine encounter and roles:  [name]    This is an established patient visit.    Reason for visit: No chief complaint on file.      HPI:    This is a 43 y.o. female with a history of Type 2 Diabetes for almost 10 years.  Known complications include none.    Since her last visit, she went off Trulicity as she had some GI complaints so decided to trial off the medication. She is complaining of elevations after meals since stopping Trulicity.  She is unsure if the Trulicity was the cause and would like to trial the lower dose to see if it helps some.    She also has another infection in urogenital area. She is being treated with antibiotics.    Denies any CP or SOB, no polyuria, no polydipsia. Occasional double vision when she feels a migraine is coming on. No blurry vision. No severe hypoglycemia requiring assistance or EMS. No paresthesias in hands or feet. No open wounds or sores on feet and performs daily self checks.Has ongoing diabetes health maintenance activities done including routine eye exams, podiatry and dental.    Current diabetes regimen:   trulicity 4.7QQ weekly - stopped 2 months ago    humalog in pump  OMNIPOD  Basal Rates:  MN 0.95 units/hr  3AM 1.45 units/hr  6AM 1.2 units/hr  10PM 0.95 units/hr  ICR 8  ISF 40  Target 140  Active Insulin 4h  49% basal /51% bolus  Glucose Sensor - G6 Dexcom    Not eating much during the days, POTS makes her feel tired and bloated.    She lacks motivation right now and feels fatigued often    Blood sugar logs and/or meter were brought to the visit for review.  Reviewed on Glooko/Dexcom  Past 30 days  Lowest BG in the 70s    Fasting 100s-200s  Lunch  100s- 200s  Dinner 100s-180s  Bedtime 70s-200s    Blood sugars seem to rise after dinner or after a bedtime snack. No lows.    Checking 4-6x  daily.     hypoglycemia occurs 0 times a week.  It is most often at never.  There has been the need for medical assistance for these episodes none.      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 Lower abdomen with cellulitis changes, wound probed and purulent material expressed.  Had PICC line for "multiple infiltrations" of what? D/c-ed home on 10d of Augmentin 9/11.      Anginal pain     Anxiety     Arthritis     Asthma     Depression     bipolar    Diabetes mellitus     Previously on SU and metformin, now diet controlled    Diverticulitis 09/2010    Dysfunctional uterine bleeding     Fibromyalgia     GERD (gastroesophageal reflux disease)  GERD (gastroesophageal reflux disease) 10/04/2016    High blood pressure     Hyperlipidemia     Liver disease     fatty liver     Long term (current) use of insulin, Dermal Adhesed V-Go Insulin Delivery Device 10/04/2016    Migraine     Neuromuscular disorder     POTS (postural orthostatic tachycardia syndrome)     Sebaceous cyst of breast     right axilla    TMJ click, left 09/09/4330    Varicella      Past Surgical History:   Procedure Laterality Date    APPENDECTOMY  2016    arthroscopic shoulder surgery Right 2010    Related to lifting    CARDIAC CATHETERIZATION  09/2011    negative    COLON SURGERY      COSMETIC SURGERY Bilateral 12/12/2017    B/L internal maxillary arteries embolized due to recurrent epistaxis, Dr Linard Millers    DILATION AND CURETTAGE OF UTERUS  2005, 2007    x2 for menorraghia    LEFT COLECTOMY  01/03/14    Dr Tresa Res    loop recorder  Feb 03 2014    loop recorder removal      PR COLONOSCOPY THRU COLOTOMY N/A 02/16/2016    Procedure:  COLONOSCOPY;  Surgeon: Kelly Splinter, MD;  Location: [facility];  Service: GI    PR CYSTOURETHROSCOPY,BIOPSY N/A 10/17/2016    Procedure: CYSTOSCOPY BLADDER ;  Surgeon: Ed Blalock, MD;  Location[facility];  Service: Urology    PR EDG TRANSORAL BIOPSY SINGLE/MULTIPLE N/A 10/29/2016    Procedure: EGD;  Surgeon: Kelly Splinter, MD;  Location: [facility];  Service: GI    TONSILLECTOMY       Family History   Problem Relation Age of Onset    Hypertension Father     Diabetes Father     Kidney Disease Father     Hyperlipidemia Father     Heart attack Father 28    Other Father         PVD    Heart Disease Father     Hypertension Mother     Hyperlipidemia Mother     Heart attack Mother 36    Diabetes Mother     Eczema Mother     Psoriasis Mother     Heart Disease Mother     Hypertension Brother     Heart Disease Brother 82        prinzmetal's angina    Heart Disease Sister         currently having work up    [place] Sister     Hypertension Sister     Breast cancer Maternal Grandmother     Stroke Other     Thyroid disease Sister     Thyroid cancer Sister     Anesthesia problems Neg Hx      Social History     Socioeconomic History    Marital status: Married     Spouse name: Not on file    Number of children: Not on file    Years of education: Not on file    Highest education level: Not on file   Tobacco Use    Smoking status: Former Smoker     Packs/day: 0.50     Years: 3.00     Pack years: 1.50     Types: Cigarettes    Smokeless tobacco: Never Used    Tobacco comment:  quit age late 14s   Substance and Sexual Activity    Alcohol use: Yes     Alcohol/week: 0.0 standard drinks     Comment: 0-1 drink/ month    Drug use: No    Sexual activity: Yes     Partners: Female     Birth control/protection: I.U.D.   Other Topics Concern    Special Diet Not Asked    Exercise Not Asked    Weight Concern Not Asked    Caffeine Concern Not Asked   Social History  Narrative    ** Merged History Encounter **         Married to [employer], recently relocated back to [place] from [place] due to family stressors. 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        Allergies:   Allergies   Allergen Reactions    Morphine Itching    Trazodone Anaphylaxis    Seasonal Allergies Itching    Lidocaine Rash     Patches caused a rash    Nsaids Other (See Comments)     Bleeding/epistaxis    Other [Other] Other (See Comments)     Derma Bond(skin glue) pruritis/redness and c/o respiratory distress.   Received name: Other       Current Outpatient Medications on File Prior to Visit   Medication Sig Dispense Refill    clindamycin (CLEOCIN T) 1 % external solution Apply topically 2 times daily Continue until improvement in cysts 60 mL 2    blood pressure monitor, automatic with arm cuff Use as directed to monitor blood pressure 1 each 0    Alcohol Swabs (B-D SINGLE USE SWABS REGULAR) PADS USE 1 PAD TOPICALLY TWO TIMES DAILY TO CHECK BLOOD GLUCOSE 100 each 1    diphenoxylate-atropine (LOMOTIL) 2.5-0.025 MG per tablet Take 1-2 tablets by mouth 4 times daily as needed for Diarrhea Max daily dose: 8 tablets Code D 240 tablet 0    generic DME Please dispense one digital BP cuff/monitor to check BP at home daily. Use as directed. 1 each 0    incontinence supply disposable Order Description:  Wear as needed for stool leakage 100 each 3    pantoprazole (PROTONIX) 40 MG EC tablet TAKE 1 TABLET BY MOUTH EVERY DAY, SWALLOW WHOLE. DO NOT BREAK, CHEW OR CRUSH 90 tablet 3    Insulin Disposable Pump (OMNIPOD DASH 5 PACK) MISC By 1 Device no specified route as needed Change every 2 days = 45 pods for 90 days supply = 9 (5packs) 45 each 3    zinc oxide 20 % ointment Apply topically as needed for Dry Skin (Patient not taking: Reported on 02/01/2019) 30 g 3    clotrimazole (LOTRIMIN) 1 % cream Apply topically 2 times daily (Patient not taking: Reported on 02/01/2019)  15 g 3    clonazePAM (KLONOPIN) 0.5 MG tablet Take 1 tablet (0.5 mg total) by mouth daily as needed Max daily dose: 0.5 mg 28 tablet 0    Continuous Blood Gluc Sensor (DEXCOM G6 SENSOR) MISC By 1 Device no specified route as needed 3 each 11    Continuous Blood Gluc Transmit (DEXCOM G6 TRANSMITTER) MISC By 1 Device no specified route as needed 1 each 3    Continuous Blood Gluc Receiver (DEXCOM G6 RECEIVER) DEVI By 1 Device no specified route as needed 1 Device 0    DULoxetine (CYMBALTA) 30 MG DR capsule TAKE 1 CAPSULE BY MOUTH TWO TIMES DAILY 180  capsule 3    ascorbic acid (VITAMIN C) 100 MG tablet Take 5 tablets (500 mg total) by mouth daily 100 tablet 3    lamoTRIgine (LAMICTAL) 150 MG tablet Take 1 tablet (150 mg total) by mouth daily 90 tablet 3    busPIRone (BUSPAR) 30 MG tablet Take 1 tablet (30 mg total) by mouth 2 times daily 180 tablet 3    insulin lispro 100 UNIT/ML injection vial Inject as directed up to MDD 100 units 40 mL 11    famciclovir (FAMVIR) 500 MG tablet Take 1 tablet (500 mg total) by mouth 3 times daily as needed 30 tablet 5    topiramate (TOPAMAX) 100 MG tablet Take 1 tablet (100 mg total) by mouth 2 times daily 180 tablet 3    glucagon (BAQSIMI) 3 MG/DOSE nasal powder Inhale one dose (3 mg) into one nostril once as needed for low blood sugar. If no response in 15 min, inhale second dose from new device 2 each 5    promethazine (PHENERGAN) 25 MG tablet Take 1 tablet (25 mg total) by mouth every 4-6 hours as needed 30 tablet 3    fluticasone (FLONASE) 50 MCG/ACT nasal spray SPRAY 1 SPRAY INTO EACH NOSTRIL ONE TIME DAILY 48 g 3    insulin syringe-needle U-100 (BD ULTRAFINE) 31G X 5/16" 0.3 ML HALF-UNIT Use 3 times a day as instructed. 100 each 11    VENTOLIN HFA 108 (90 Base) MCG/ACT inhaler INHALE 1 TO 2 PUFFS BY MOUTH INTO THE LUNGS EVERY 6 HOURS AS NEEDED FOR WHEEZING. SHAKE WELL BEFORE EACH USE. 18 Inhaler 5    Continuous Blood Gluc Receiver (DEXCOM G6 RECEIVER) DEVI By 1  Device no specified route as needed 1 Device 0    blood glucose test strip Test  8 times a day.   Brand name of strips Contour NEXT test strips for linked omnipod pump 250 strip 11    lancets (BAYER MICROLET) Use   8  times per day as instructed for blood glucose testing. 250 each 11    azelastine (OPTIVAR) 0.05 % ophthalmic solution Place 1 drop into both eyes 2 times daily      SUMAtriptan refill (IMITREX STATDOSE) 6 MG/0.5ML injection INJECT 0.5ML UNDER THE SKIN ONCE AS NEEDED FOR MIGRAINE. MAY REPEAT DOSE ONCE AFTER 1 HOUR 2 Syringe 5    glucagon (GLUCAGEN) 1 MG injection kit Inject 1 mg into the skin once as needed for Low blood sugar   Discard any unused portion. 1 each 5    SUMAtriptan (IMITREX) 50 MG tablet TAKE 1 TABLET BY MOUTH AS NEEDED FOR MIGRAINE, TAKE AT ONSET OF HEADACHE. MAY REPEAT ONCE IN 2 HOURS 9 tablet 5    SUMAVEL DOSEPRO 6 MG/0.5ML needle-free injection INJECT 0.5MLS (6MG TOTAL) UNDER THE SKIN AS NEEDED FOR MIGRAINE MAY REPEAT ONCE AFTER 1 HOUR IF NEEDED  1    atorvastatin (LIPITOR) 40 MG tablet Take 1 tablet (40 mg total) by mouth daily (with dinner) 90 tablet 3    metoprolol (TOPROL-XL) 25 MG 24 hr tablet Take by mouth daily         midodrine (PROAMATINE) 10 MG tablet Take 1 tablet (10 mg total) by mouth 3 times daily 90 tablet 0    Non-System Medication The above patient is followed in our clinic and cannot resume work permanently. 1 each 1     No current facility-administered medications on file prior to visit.      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 no  -Dawn phenomenon:  yes  -HbA1c greater than 7% or 1% over upper range of normal: yes  -History of suboptimal glycemic control before or during pregnancy: na  -LAST 30 DAYS of BG LOGS (in notes below) or ranges from: Glooko/G6 report 70s-200s    Review of Systems:  CONSTITUTIONAL:  Appetite good, no fevers, night sweats, weight loss  HEAD: No headache, dizziness or syncope  EYES:No vision changes or eye pain, intermittent blurry vision with migraine  ENT: No hearing difficulties or ear pain  CV: No chest pain, shortness of breath or edema.  RESPIRATORY:  No SOB, cough or wheezing  GI:  No nausea, vomiting, abdominal pain or change in bowel habits  GU:  No dysuria, urgency or incontinence  NEURO:  No mental status changes, motor weakness or sensory changes  PSYCH:  + depression and anxiety, worsened since COVID and father being ill  MS:  No joint pain, swelling or musculoskeletal deformities  SKIN:  No rashes  HEME/LYMPH:  No easy bleeding, bruising or swollen nodes  ENDOCRINE:  No polyuria, polydipsia, polyphagia or heat/cold intolerance    Patient's problem list, allergies, and medications were reviewed and updated as appropriate.  Please see the EHR for full details.    Exam and data reviewed:    Physical Examination:   There were no vitals filed for this visit.  There is no height or weight on file to calculate BMI.  Wt Readings from Last 3 Encounters:   02/01/19 119.9 kg (264 lb 6.4 oz)   01/18/19 120 kg (264 lb 9.6 oz)   10/29/18 124.3 kg (274 lb)       CONSTITUTIONAL:  well developed, well nourished, no acute distress  HEENT:  no lid lank, no proptosis.   NECK on inspection, no thyromegaly,   HEART/VASCULAR:no LE edema  EXTREMITIES:  joints without deformity or synovitis. Foot exam deferred.  NEUROLOGICAL: alert and oriented x 3  SKIN:  No rashes.   ENDOCRINE: No supraclavicular fat pads, facial plethora, moon facies,    PSYCH:mood appropriate    Labs and Imaging:    I personally reviewed and confirmed all laboratory and radiology testing listed below:      Lab results: 11/25/18  1425   Sodium 139   Potassium 4.2   Chloride 105   CO2 20   UN 11   Creatinine 0.95   GFR,Caucasian 73   GFR,Black 84   Glucose 110*   Calcium 9.9         Lab Results   Component Value Date    HA1C 6.7 (H) 11/25/2018       Lab Results   Component Value Date    ALT 20 11/25/2018    AST 15 11/25/2018     Lab Results   Component Value Date    CHOL 146 11/25/2018    HDL 36 (L)  11/25/2018    LDLC 84 11/25/2018    TRIG 129 11/25/2018    CHHDC 4.1 11/25/2018     Lab Results   Component Value Date    TSH 1.50 01/22/2018       Lab Results   Component Value Date    WBC 16.5 (H) 12/14/2017    HGB 13.3 12/14/2017    HCT 42 12/14/2017    MCV 88 12/14/2017    PLT 372 12/14/2017       Assessment: This is a 43 y.o. female with Type 2 Diabetes Uncontrolled with hyperglycemia.  Goal A1c is <7%. She is not having any nocturnal lows since basal rates adjusted. She is having postprandial hyperglycemia especially after dinner and late night snacks due to insufficient insulin and lack of GLP-1    Plan:   Change ICHO to 7 with dinner and snacks  Restart low dose Trulicity and will evaluate the efficacy over the next few weeks.  BP I - on betablocker and diuretic  On statin therapy.  Recommend annual dilated eye exams and biannual dental exams for routine health maintenance.  hba1c before next appointment    Follow up: 3 month(s) or sooner if needed.      Nada Libman, NP    Nada Libman, NP  [facility] Department of Endocrinology  [address] [place], [place] [address]  Phone [phone]  Fax [phone]      Consent was obtained from the patient to complete this video visit; including the potential for financial liability.          Nada Libman, NP

## 2019-02-17 NOTE — Telephone Encounter (Signed)
2nd attempt to reschedule appt left message for patient to return call

## 2019-02-18 ENCOUNTER — Encounter: Payer: Self-pay | Admitting: Internal Medicine

## 2019-02-18 ENCOUNTER — Other Ambulatory Visit: Payer: Self-pay | Admitting: Internal Medicine

## 2019-02-18 MED ORDER — CEPHALEXIN 500 MG PO CAPS *I*
500.0000 mg | ORAL_CAPSULE | Freq: Four times a day (QID) | ORAL | 0 refills | Status: AC
Start: 2019-02-18 — End: 2019-02-25

## 2019-02-18 NOTE — Telephone Encounter (Signed)
Could someone from the pt's office handle this , I work at the [employer] thank you

## 2019-02-18 NOTE — Telephone Encounter (Signed)
Nurses-Can you please find out which pharmacy she wants this sent to and route back to dr. Lowella Fairy please and thank you

## 2019-02-18 NOTE — Telephone Encounter (Signed)
Writer spoke with patient . Patient uses [facility]

## 2019-02-19 ENCOUNTER — Encounter: Payer: Self-pay | Admitting: Primary Care

## 2019-02-19 NOTE — Progress Notes (Signed)
The writer called Kaylee Harris to see how she is doing and to review the care plan and crisis plan.  The writer reviewed the care plan with Kaylee Harris and she reports no changes at this time.  She reports still needing assistance with food cupboard listings and that she received the [place] bulletin that the writer sent to her in the mail.  Kaylee Harris reports that she is currently waiting in line for a 40 pound food giveaway and she doesn't feel that she is going to get the box of food.  The Probation officer then gave Kaylee Harris the phone #[phone] to call and set up a pick up time for a food giveaway.  The writer then gave Kaylee Harris the times that she can choose to pick up the food.  Kaylee Harris reports that she is going to call.    Kaylee Harris reports that her dad is in the hospital again and that she is taking on too much with her parents and that she is not receiving the care that she needs.  The writer reminded Kaylee Harris that it is important to take care of herself before she is able to take care of anyone else.  Kaylee Harris reviewed the crisis plan and reports no changes.

## 2019-02-19 NOTE — Telephone Encounter (Signed)
3rd attempt to reach patient. Unable to reach letter sent out

## 2019-02-22 ENCOUNTER — Encounter: Payer: Self-pay | Admitting: Gastroenterology

## 2019-02-23 ENCOUNTER — Other Ambulatory Visit: Payer: Self-pay | Admitting: Pulmonary and Critical Care Medicine

## 2019-02-23 ENCOUNTER — Other Ambulatory Visit: Payer: Self-pay | Admitting: Primary Care

## 2019-02-23 DIAGNOSIS — F419 Anxiety disorder, unspecified: Secondary | ICD-10-CM

## 2019-02-23 DIAGNOSIS — K589 Irritable bowel syndrome without diarrhea: Secondary | ICD-10-CM

## 2019-02-23 DIAGNOSIS — Z760 Encounter for issue of repeat prescription: Secondary | ICD-10-CM

## 2019-02-23 NOTE — Telephone Encounter (Signed)
Last appt: 02/15/2019     Next appt:  04/16/2019        Recent Lab Values 01/04/2018 12/17/2017 10/17/2017 10/17/2017 03/31/2017 10/29/2016 10/17/2016   EXP DATE 9/20 02/02/2018 11-03-18 08-04-18 04/04/18 04/04/2017 08/03/17   [facility] - - - - - - -

## 2019-02-24 ENCOUNTER — Other Ambulatory Visit: Payer: Self-pay | Admitting: Primary Care

## 2019-02-24 DIAGNOSIS — IMO0002 Reserved for concepts with insufficient information to code with codable children: Secondary | ICD-10-CM

## 2019-02-24 NOTE — Telephone Encounter (Signed)
Patient Name: Annaleise Haneline   Birth Date: 08-Jul-1975   Address: [place],  [address]   Sex: Female   Rx Written Rx Dispensed Drug Quantity Days Supply Prescriber Name Payment Method Dispenser   01/26/2019 01/29/2019 tramadol hcl 50 mg tablet  42 14 [place], [place]. #19   12/18/2018 12/21/2018 tramadol hcl 50 mg tablet  16 14 [name], Darnell Level (MD) [place]. #19   11/12/2018 11/15/2018 diphenoxylate-atropine 2.5-0.025 mg tablet  240 30 [place], [place]. #19   10/13/2018 10/15/2018 tramadol hcl 50 mg tablet  42 14 [place], [place]. #19   08/24/2018 08/27/2018 clonazepam 0.5 mg tablet  28 28 [place], [place]. #19   08/20/2018 08/21/2018 tramadol hcl 50 mg tablet  42 14 [place], [place]. #19   07/22/2018 07/22/2018 clonazepam 0.5 mg tablet  28 28 [name], Darnell Level (MD) [place]. #02   05/27/2018 06/02/2018 tramadol hcl 50 mg tablet  42 14 [place], [place]. #02   03/25/2018 03/25/2018 tramadol hcl 50 mg tablet  42 14 [place], [place]. [employer]

## 2019-02-25 MED ORDER — TRAMADOL HCL 50 MG PO TABS *I*
ORAL_TABLET | ORAL | 0 refills | Status: DC
Start: 2019-02-25 — End: 2019-04-09

## 2019-02-25 MED ORDER — DIPHENOXYLATE-ATROPINE 2.5-0.025 MG PO TABS *I*
1.0000 | ORAL_TABLET | Freq: Four times a day (QID) | ORAL | 0 refills | Status: DC | PRN
Start: 2019-02-25 — End: 2020-09-28

## 2019-02-25 NOTE — Telephone Encounter (Signed)
Last appt: 02/15/2019     Next appt:  02/24/2019        Recent Lab Values 01/04/2018 12/17/2017 10/17/2017 10/17/2017 03/31/2017 10/29/2016 10/17/2016   EXP DATE 9/20 02/02/2018 11-03-18 08-04-18 04/04/18 04/04/2017 08/03/17   [facility] - - - - - - -       [name]   Reference #: [id number]   Others' Prescriptions  Patient Name: Kaylee Harris   Birth Date: February 02, 1976   Address: [place], [place] [address]   Sex: Female   Rx Written Rx Dispensed Drug Quantity Days Supply Prescriber Name Payment Method Dispenser   01/26/2019 01/29/2019 tramadol hcl 50 mg tablet  42 14 [place], [place]. #19   12/18/2018 12/21/2018 tramadol hcl 50 mg tablet  30 14 [name], Darnell Level (MD) [place]. #19   11/12/2018 11/15/2018 diphenoxylate-atropine 2.5-0.025 mg tablet  240 30 [place], [place]. #19   10/13/2018 10/15/2018 tramadol hcl 50 mg tablet  42 14 [place], [place]. #19   08/24/2018 08/27/2018 clonazepam 0.5 mg tablet  28 28 [place], [place]. #19   08/20/2018 08/21/2018 tramadol hcl 50 mg tablet  42 14 [place], [place]. #19   07/22/2018 07/22/2018 clonazepam 0.5 mg tablet  28 28 [name], Darnell Level (MD) [place]. #02   05/27/2018 06/02/2018 tramadol hcl 50 mg tablet  42 14 [place], [place]. #02   03/25/2018 03/25/2018 tramadol hcl 50 mg tablet  42 14 [place], [place]. [employer]

## 2019-02-25 NOTE — Telephone Encounter (Signed)
Last appt: 02/15/2019     Next appt:  04/16/2019        Recent Lab Values 01/04/2018 12/17/2017 10/17/2017 10/17/2017 03/31/2017 10/29/2016 10/17/2016   EXP DATE 9/20 02/02/2018 11-03-18 08-04-18 04/04/18 04/04/2017 08/03/17   [facility] - - - - - - -

## 2019-02-26 MED ORDER — CLONAZEPAM 0.5 MG PO TABS *I*
0.5000 mg | ORAL_TABLET | Freq: Every day | ORAL | 0 refills | Status: DC | PRN
Start: 2019-02-26 — End: 2019-12-17

## 2019-03-03 ENCOUNTER — Ambulatory Visit: Payer: Medicare (Managed Care)

## 2019-03-03 ENCOUNTER — Encounter: Payer: Medicare (Managed Care) | Admitting: Obstetrics and Gynecology

## 2019-03-03 ENCOUNTER — Encounter: Payer: Self-pay | Admitting: Obstetrics and Gynecology

## 2019-03-03 DIAGNOSIS — F603 Borderline personality disorder: Secondary | ICD-10-CM

## 2019-03-03 DIAGNOSIS — F422 Mixed obsessional thoughts and acts: Secondary | ICD-10-CM

## 2019-03-03 DIAGNOSIS — F4312 Post-traumatic stress disorder, chronic: Secondary | ICD-10-CM

## 2019-03-03 DIAGNOSIS — F331 Major depressive disorder, recurrent, moderate: Secondary | ICD-10-CM

## 2019-03-03 NOTE — Progress Notes (Signed)
Behavioral Health Progress Note   Patient encounter was performed via Video     Patient located at: home    Provider located at: home office    Other participants in this encounter and roles:  N/A    Consent was previously obtained from the patient to complete this service via telehealth; including the potential for financial liability.  Length of Session: 45 minutes    Contact Type:  Location: Telemedicine Covid-19    Telephone Contact     Problem(s)/Goals Addressed from Treatment Plan:    Problem 1:   Treatment Problem #1 12/08/2018   Patient Identified Problem OCD       Goal for this problem:    Treatment Goal #1 12/08/2018   Patient Identified Goal Utilize CBT to reduce behaviors       Progress towards this goal: Patient reports situational anxiety.    Mental Status Exam:  APPEARANCE: Appears stated age, Well-groomed, Casual  ATTITUDE TOWARD INTERVIEWER: Cooperative  MOTOR ACTIVITY: WNL (within normal limits)  EYE CONTACT: Direct  SPEECH: Normal rate and tone  AFFECT: Full Range  MOOD: Anxious and Depressed  THOUGHT PROCESS: Circumstantial  THOUGHT CONTENT: Negative Rumination  PERCEPTION: No evidence of hallucinations  CURRENT SUICIDAL IDEATION: patient denies  CURRENT HOMICIDAL IDEATION: Patient denies  ORIENTATION: Alert and Oriented X 3.  CONCENTRATION: Good  MEMORY:   Recent: intact   Remote: intact  COGNITIVE FUNCTION: Average intelligence  JUDGMENT: Intact  IMPULSE CONTROL: Fair  INSIGHT: Fair    Risk Assessment:  ASSESSMENT OF RISK FOR SUICIDAL BEHAVIOR  Changes in risk for suicide from baseline Formulation of Risk and/or previous intake, including newly identified risk, if any: none  Violence risk was assessed and No Change noted from baseline formulation of risk and/or previous assessment.    Session Content:: Patient reports situational anxiety. Patient told Probation officer that her father was in the hospital again for two weeks. She said that her family wants her to take over his chart. Patient said that her  parents' phones are still not set up - asked Joy to do it - she gave up and handed it back to her. She said that her anxiety feels different now - feels like someone is sitting on her chest - house is closing in around her; noticing she is avoiding eye contact again - doesn't feel like she can't still - "different type of anxious" "everything" is sitting on her shoulders. Writer validated patient and asked patient to review what specifically has happened today. Patient said that she woke up - hadn't even had coffee and aunt called - going on about covid, what's going on with father, hung up with her then father called - complaints about her nieces and mom was eating junk food in front of him and playing on her phone, dogs were barking, shari doing laundry, things "crazy" - happens "all the time" doesn't know what to do - sister shows up almost every day. Writer reinforced situational stressors and validated patient. Patient said that her cousin wanted to have Thanksgiving at his restaurant with the whole family. She said  that she wasn't going b/c of Covid and then [name] said that her family might view this as selfish - got upset. Patient said that she has a new medication - for cysts and is wondering if this is creating more anxiety. Patient said that her brother calls her and tells her that he feels left out of the loop - a stressful month for him, why did  he stay in [place], etc. She said that everyone wants her to be the referee and Probation officer agreed that her boundaries are not being maintained. Patient said that she tries but has been feeling more like she can't do this. Writer suggested DBT group and patient said that she would be willing to try again.    Visit Diagnosis:      ICD-10-CM ICD-9-CM   1. Mixed obsessional thoughts and acts  F42.2 300.3   2. Borderline personality disorder  F60.3 301.83   3. MDD (major depressive disorder), recurrent episode, moderate  F33.1 296.32   4. Chronic post-traumatic stress  disorder (PTSD)  F43.12 309.81       Interventions:  Group services referral:  DBT  Motivational Interviewing  Supportive Psychotherapy    Current Treatment Plan   Created/Updated On 12/08/2018   Next Treatment Plan Due 03/09/2019     Consent was previously obtained from the patient to complete this Video consult; including the potential for financial liability.    Time spent on the  Video with patient: 40+ minutes    Length of time compiling the report:15 minutes    Plan:  Psychotherapy continues as described in care plan; plan remains the same.    NEXT APPT: 03/22/19.      Tresa Res, LCSW-R

## 2019-03-03 NOTE — Progress Notes (Signed)
[facility]  ADULT AMBULATORY GROUP THERAPY SERVICE REFERRAL     Identifying Data:  Patient: Kaylee Harris Record Number: [id number]  DOB: Jul 22, 1975  Address: [address]  [place] [address]     Patient Phone: [phone] (home)     Insurance: Payor: [employer] / Plan: [place] / Product Type: *No Product type* /        Check where patient is currently open in the system:     [x]   [facility]     []   [place]     []   [place]    []   [facility]     []   [facility]    []   [facility]     []   [facility]     Diagnosis:     ICD-10-CM ICD-9-CM   1. Mixed obsessional thoughts and acts  F42.2 300.3   2. Borderline personality disorder  F60.3 301.83   3. MDD (major depressive disorder), recurrent episode, moderate  F33.1 296.32   4. Chronic post-traumatic stress disorder (PTSD)  F43.12 309.81       History:  Was patient HOSPITALIZED within the last three months? No    Past PSYCHIATRIC HISTORY: Inpatient, Partial, Outpatient  Previous GROUP EXPERIENCE: Yes       Risk Factors:  Suicide or self-mutilation: Past  Violence: None  Alcohol Use: None  Drug Use: None    Goals for Group Treatment:  Goal #1: Increase skills  Goal #2:      Adult Group Therapy Offerings (Please check preferential group and time):    BH Adult General    []  Depression Management: Mon. 4:00-5:30  []  Anxiety Management:  Tues. 2:30-4:00  []  Interpersonal Therapy for Intimate Partner Violence (HEAL):  Wed. 3:00-4:30  []  Chronic Pain Management: Thur. 1:00-2:30   []  Women Trauma Group: Thurs. 3:00-4:00  []  Brief Focused Group ( Interpersonal Process Group): Thurs. 5:00-6:00    DBT Modules    []  Distress Tolerance Skills Training:    []  Mon. 1:00-2:30 []  Mon. 5:30-7:00  []  Tues. 11:00-12:30  []  Tues.   6:30-8:00 []  Wed. 1:00-2:30 []  Wed. 4:00-5:30 []  Wed. 5:30-7:00  []  Thurs.3:00-4:30     []  Emotional Regulation Skills Training:     []  Mon. 1:00-2:30 []  Mon. 5:30-7:00 []  Tues. 11:00-12:30  []  Tues.   6:30-8:00  []  Wed. 1:00-2:30 []  Wed. 4:00-5:30 []  Wed.5:30-7:00 []  Thurs. 3:00-4:30    [x]  Interpersonal Skills Training:    []  Mon. 1:00-2:30 []  Mon. 5:30-7:00 []  Tues. 11:00-12:30  []  Tues.   6:30-8:00  []  Wed. 1:00-2:30 []  Wed. 4:00-5:30  []  Wed.5:30-7:00 [x]  Thurs. 3:00-4:30    []  DBT Advanced Skills Training (must have completed [facility] DBT rotation): Tues. 6:30-8:00    []  Other      [facility]   Spanish speaking Patients  ? CBT Brief Focus Group:  Monday 1:45-3:00   ? CBT Brief Focus Group:  Wednesday 2:15-3:30   ? CBT for Beginners Depression Group:  Wednesday 9:45 -11:00   ? Advanced CBT Depression Group:  Wednesday 10:45-12:00    [facility]  []  Anxiety Management   []  Listening Skills  []  DBT (Emotion Regulation, Interpersonal Skills, Distress Tolerance)  []  CBT for Depression/Anxiety Management (SPANISH ONLY)  []  Symptom Management for Psychosis  []  Complicated Grief  []  Relaxation  []  Mental Illness/Chemical Addiction  []  Survivors  of Trauma  []  Transition Group      Comments:  Is the patient receptive to this referral? Yes  Will you be involved in the patient's continued care? Yes  Does patient have an individual therapist? Yes    Name of Therapist: Tresa Res    Is he/she in support of this referral? Yes      Referral Processing Information:  The above information must be completed in full for referral to be processed.    Please send completed form to Herbie Drape, Psychiatry via [facility] for [facility].  Please send completed form to Nicolette Bang, Psychiatry via [facility] for [place].

## 2019-03-04 ENCOUNTER — Telehealth: Payer: Self-pay

## 2019-03-04 DIAGNOSIS — Z5181 Encounter for therapeutic drug level monitoring: Secondary | ICD-10-CM

## 2019-03-04 NOTE — Telephone Encounter (Signed)
T/C from patient. States she didn't receive a response to her MyChart message yesterday. Message copied below and forwarded to providers.    Elie Goody, RN  to [facility]         8:59 AM  The pt does have an appointment on 11/4. Do you want her to d/c the Spironolactone?   March 03, 2019  Leata Mouse  to Anderson Malta, MD         4:55 PM  This medication I have been on for a month. Yes it has been doing a good job so far but it has not been doing a good job on my Librarian, academic. I have had bad mood swings & my legs have been having bad muscle spasms I also have been itching not all the time but alot of it. To where I can't sit still. Can't sleep good I have been having cold sweats. To where I wake up and the bed it wet. Very anxious I'm really thinking it's the medicine. My psych meds were all working fine till I started this medicine. Please let me know what I can do to stop this message. It has gotten worse the longer I've been on the medication. Thanks Bethena Roys   Attachments

## 2019-03-04 NOTE — Telephone Encounter (Signed)
Routed back to nurses to review with one of the MD's in the office today if possible, she has a hx of needing a labial abscess drained in 2018 by Dr. Gaylyn Cheers, or can she be added in for an urgent appt tomorrow to discuss her concerns with an MD if possible?    Thank you

## 2019-03-04 NOTE — Telephone Encounter (Signed)
Has patient started any other medications in this time period?  If patient feels like these symptoms have coincided with thismedication, it is reasonable to D/C this medication until patient's appointment.  Please also have patient go to lab and get a BMP.    Chipper Oman, MD 03/04/2019 4:31 PM

## 2019-03-04 NOTE — Telephone Encounter (Signed)
T/c to patient. States she has not started any new medications during this time period. Will stop medication. Will go to lab for BMP. Will come to appointment on 11/4.

## 2019-03-05 ENCOUNTER — Other Ambulatory Visit
Admission: RE | Admit: 2019-03-05 | Discharge: 2019-03-05 | Disposition: A | Discharge status: Home / Self Care | Payer: Medicare (Managed Care) | Source: Ambulatory Visit | Attending: Obstetrics and Gynecology | Admitting: Obstetrics and Gynecology

## 2019-03-05 DIAGNOSIS — Z5181 Encounter for therapeutic drug level monitoring: Secondary | ICD-10-CM

## 2019-03-05 LAB — BASIC METABOLIC PANEL
Anion Gap: 10 (ref 7–16)
CO2: 25 mmol/L (ref 20–28)
Calcium: 9.5 mg/dL (ref 8.8–10.2)
Chloride: 104 mmol/L (ref 96–108)
Creatinine: 0.94 mg/dL (ref 0.51–0.95)
GFR,Black: 85 *
GFR,Caucasian: 74 *
Glucose: 84 mg/dL (ref 60–99)
Lab: 10 mg/dL (ref 6–20)
Potassium: 4.8 mmol/L (ref 3.3–5.1)
Sodium: 139 mmol/L (ref 133–145)

## 2019-03-09 ENCOUNTER — Telehealth: Payer: Self-pay

## 2019-03-09 ENCOUNTER — Encounter: Payer: Self-pay | Admitting: Primary Care

## 2019-03-09 ENCOUNTER — Telehealth: Payer: Self-pay | Admitting: Primary Care

## 2019-03-09 NOTE — Telephone Encounter (Signed)
LM for pt to return call to reschedule to 12/4

## 2019-03-09 NOTE — Telephone Encounter (Signed)
Writer attempted to reach PT regarding the DBT group referral received from [facility].  Writer called and an unknown woman answered the mobile number, Writer left a message for PT to return the call.  Writer explained that she is working remotely however checking messages hourly.  When PT calls back Writer will return  the call and offer to scheduled PT for a video screen with Dianna Guerin for the Distress Tolerance Skills Group starting in December.

## 2019-03-10 ENCOUNTER — Encounter: Payer: Self-pay | Admitting: Obstetrics and Gynecology

## 2019-03-10 ENCOUNTER — Ambulatory Visit: Payer: Medicare (Managed Care) | Admitting: Obstetrics and Gynecology

## 2019-03-10 VITALS — BP 122/70 | Temp 97.9°F | Wt 264.2 lb

## 2019-03-10 DIAGNOSIS — L732 Hidradenitis suppurativa: Secondary | ICD-10-CM

## 2019-03-10 MED ORDER — DOXYCYCLINE MONOHYDRATE 100 MG PO CAPS *I*
100.0000 mg | ORAL_CAPSULE | Freq: Once | ORAL | 5 refills | Status: AC
Start: 2019-03-10 — End: 2019-03-10

## 2019-03-10 NOTE — Patient Instructions (Signed)
Take daily doxycyline - we can increase dose to twice daily as needed. Let us know.     Keep doing superb vulvar care.

## 2019-03-10 NOTE — Progress Notes (Signed)
[facility] Visit  Location: [facility]     Subjective     LECRESHA MANNERING is a 43 y.o. G0P0000 who presents as follow-up for medication management of hidradenitis.    She has derm lesion scheduled in January. She reports new lesions under her now a new one under foot. She was treated by PCP with antibiotics.     She was started on Spironolactone with good relief from new lesions. She started to have side effects including cold sweats, leg cramps, and irritable/anxious while taking spironolactone. She feels these symptoms have improved since stopping the medication and feels they were possibly interacting the with medication.     She reports history of POTS with frequent light-headedness that was worse on spironolactone. While on the medication she felt she had less breakouts.    Patient's medications, allergies, past medical, surgical, obstetrical, gynecologic, social and family histories were reviewed and updated as appropriate.    General ROS: negative   Respiratory ROS: no cough, shortness of breath, or wheezing  Cardiovascular ROS: no chest pain or dyspnea on exertion  Gastrointestinal ROS: no abdominal pain, change in bowel habits, or black or bloody stools  Urinary ROS: no dysuria, trouble voiding, or hematuria  Musculoskeletal ROS: negative    Objective     BP 122/70    Temp 36.6 C (97.9 F)    Wt 119.8 kg (264 lb 3.2 oz)    BMI 40.18 kg/m      General: well developed and well nourished, alert and oriented, in no acute distress  Left axilla: multiple small erythematous lesions    Pelvic:   VULVA: no active lesions or erythema, small amount of scaring of lower left vulva    Examination chaperoned by B Parker.      Assessment     DEONI KOPINSKI is an 43 y.o. G0P0000 presenting to the office for follow-up for medication management of hidradenitis.     1. Hidradenitis suppurativa           Plan     - Discussed plan to hold on spironolactone given side effects.  Reviewed dermatology input much necessary given possible lesions on her foot.  - Trial of Doxycycline 100mg  daily as suppression. Could increase to twice daily.    Dispo:  She will follow-up as needed prior to dermatology follow-up    15 min face to face time spent with the patient; >50% of this time was spent on counseling and coordination of care.    Elliot Gault, MD 03/10/2019 4:40 PM

## 2019-03-11 NOTE — Telephone Encounter (Signed)
Writer made second attempt to reach PT Writer left a VM for PT to return the call.  When PT calls back Writer will offer PT a group video screen with Dianna Guerin for the Distress Tolerance Skills starting on 04/07/2019.

## 2019-03-17 ENCOUNTER — Ambulatory Visit: Payer: Medicare (Managed Care) | Attending: Nutrition | Admitting: Registered Nurse

## 2019-03-17 DIAGNOSIS — IMO0002 Reserved for concepts with insufficient information to code with codable children: Secondary | ICD-10-CM

## 2019-03-17 DIAGNOSIS — E1165 Type 2 diabetes mellitus with hyperglycemia: Secondary | ICD-10-CM | POA: Insufficient documentation

## 2019-03-17 NOTE — Progress Notes (Signed)
Kaylee Harris 43 y.o. female was spoke from home today for diabetes education regarding  uncontrolled type 2 diabetes mellitus. Last visit 12/09/2018 for DSMT;  Omnipod DASH pump + DExcom G6 CGM. Post pump + CGM fu today.   Has started doxycyline 100 mg qd.    Telephone Visit.  Given COVID-19 pandemic declaration and recommendations for social distancing for risk reduction, this visit was conducted as a telemedicine visit for patient risk reduction.  This is an established patient visit.  Reason for visit: diabetes  Audio only due to lack of video capability   Consent was previously obtained from the patient to complete this telephone consult; including the potential for financial liability.      Recent Patient Data     No data found in the last 1 encounters.          Diabetes Meds: reviewed in eRecord; patient is taking as prescribed. Trulicity [id number] mg q week;Humalog vial      Lab Results   Component Value Date    HA1C 6.7 (H) 11/25/2018    HA1C 6.8 (H) 04/20/2018    HA1C 7.0 (H) 01/16/2018    MALBR <1.20 11/25/2018    CREAT 0.94 03/05/2019    LDLC 84 11/25/2018     Self glucose monitoring:    BG meter as needed; RT CGM . Last 30  Days average 136;  Trends of 62-285 .  Marland Kitchen   BG today:  SG 180 mg/dl; post meal; IOB 4.2 units     -- Current management: Omnipod DASH Insulin Pump  -- Insulin pump settings:  -- Basal rates:  12 AM - 0.90 units/hour   3 AM - 1.45 units/hour  6 AM - 1.20 units/hour   12pm 1.00 units/hr ;  4pm -  1.20 units/hr   10 PM - 1.10 units/hour   Total basal 27.65;  units   TDD 52.30 nits   Basal 51% ; bolus 49%  -- Active insulin time: 4 hours  -- Bolus wizard use: yes  -- Insulin:carb ratio: 10mn 1:8 grams;   8pm 1 to 7 gms  -- Sensitivity: 40 mg/dL  -- Target blood glucose: 140 mg/dL  -- Glucose sensor: G6  Low 70; high 250      Diet: 1-2 meals per day ;; snacking   Smoking: no Alcohol: no rare     Self foot check:  self care  Eye Exam inquiry: done    Assessment:   Describes proper skill with  insert PODs for pump + rotates sites  Describes proper skill with insert Dexcom G6 CGM  Tolerating lower dose GLP1   Omnipod pump emergency back up plan established; prefers to use old Omnipod pump & has supplies  ATB does not appear to impact bG control  Coun/Edu:     BARRIERS THAT AFFECT LEARNING:  Mental health ; followed by psych  READINESS TO LEARN:  good  PREFERRED METHODS OF LEARNING:  reading, demonstration and visual tools  EDUCATION PROVIDED: insulin teaching, BG Goals, using ICR and CF to determine meal bolus and  risk reduction; skin care with PODS; post pump FU  & personal CGM prob solving    Plan:     Patient Instructions   1.  Watch sensor trends for <70    2.  Change dash pods new sites    3.  Explore DASH wifi to glooko auto upload  DSMT SUPPORT PLAN:  Take advantage of your annual Medicare benefits for DSMT and MNT  Follow-up visit:  6 mos; teleheath  May 5th 2021   Time spent counseling patient: 30 minutes    MCR insurance MD follow up referral is in eRecord    Allowed 2 hrs Individual/group DSMT 0 hrs remain; expires 05/06/19    Valaria Good, RN CDE

## 2019-03-17 NOTE — Patient Instructions (Signed)
1.  Watch sensor trends for <70    2.  Change dash pods new sites    3.  Explore DASH wifi to glooko auto upload

## 2019-03-22 ENCOUNTER — Other Ambulatory Visit: Payer: Self-pay | Admitting: Psychiatry

## 2019-03-22 ENCOUNTER — Ambulatory Visit: Payer: Medicare (Managed Care) | Attending: Psychiatry

## 2019-03-22 ENCOUNTER — Other Ambulatory Visit: Payer: Self-pay | Admitting: Primary Care

## 2019-03-22 DIAGNOSIS — F4312 Post-traumatic stress disorder, chronic: Secondary | ICD-10-CM | POA: Insufficient documentation

## 2019-03-22 DIAGNOSIS — F422 Mixed obsessional thoughts and acts: Secondary | ICD-10-CM | POA: Insufficient documentation

## 2019-03-22 DIAGNOSIS — F331 Major depressive disorder, recurrent, moderate: Secondary | ICD-10-CM | POA: Insufficient documentation

## 2019-03-22 DIAGNOSIS — E119 Type 2 diabetes mellitus without complications: Secondary | ICD-10-CM

## 2019-03-22 DIAGNOSIS — F603 Borderline personality disorder: Secondary | ICD-10-CM | POA: Insufficient documentation

## 2019-03-22 MED ORDER — ALBUTEROL SULFATE HFA 108 (90 BASE) MCG/ACT IN AERS *I*
INHALATION_SPRAY | RESPIRATORY_TRACT | 1 refills | Status: DC
Start: 2019-03-22 — End: 2019-05-10

## 2019-03-22 MED ORDER — INSULIN LISPRO (HUMAN) 100 UNIT/ML IJ/SC SOLN *WRAPPED*
SUBCUTANEOUS | 11 refills | Status: DC
Start: 2019-03-22 — End: 2019-08-07

## 2019-03-22 NOTE — Progress Notes (Signed)
The Probation officer received a return phone call from [place].  Kaylee Harris reports that she is doing okay and that she still doesn't have aide services through [facility] yet.  She reports that she is in need of calling her PCP and setting up an appointment, but doesn't want to go into the office.  The Probation officer called with Kaylee Harris to her PCP's office asking for a telehealth visit over the phone to complete the paperwork.  The Probation officer and Kaylee Harris found that another doctor in the office was able to do the visit over the phone, but Kaylee Harris was unaware of who this doctor was and not knowing them she did not feel comfortable with them.  Kaylee Harris decided that she would go into the office to meet with NP, Kaylee Harris that she does know while her PCP is still out on [place].  Kaylee Harris's appointment is on December 2nd at 2:20pm with Kaylee Congress, NP.  Kaylee Harris thanked the writer for her assistance since Kaylee Harris was having a hard time scheduling the appointment due to depression and fatigue.  Kaylee Harris reports that she went to the last food box giveaway that Careers information officer gave to [place], and asked Careers information officer to send her more information regarding [employer] when [employer] finds out about them.  The Probation officer agreed.  Kaylee Harris reports needing nothing else at this time.

## 2019-03-22 NOTE — Progress Notes (Signed)
Behavioral Health Progress Note   Patient encounter was performed via Video     Patient located at: home    Provider located at: home office    Other participants in this encounter and roles: N/A    Consent was previously obtained from the patient to complete this service via telehealth; including the potential for financial liability.  Length of Session: 45 minutes    Contact Type:  Location: Telemedicine Covid-19    Telephone Contact     Problem(s)/Goals Addressed from Treatment Plan:    Problem 1:   Treatment Problem #1 12/08/2018   Patient Identified Problem OCD       Goal for this problem:    Treatment Goal #1 12/08/2018   Patient Identified Goal Utilize CBT to reduce behaviors       Progress towards this goal: Patient reports continued distress over situations.    Mental Status Exam:  APPEARANCE: Appears stated age, Well-groomed, Casual  ATTITUDE TOWARD INTERVIEWER: Cooperative  MOTOR ACTIVITY: WNL (within normal limits)  EYE CONTACT: Direct  SPEECH: Normal rate and tone and Pressured  AFFECT: Full Range  MOOD: Anxious, Depressed and Sad  THOUGHT PROCESS: Normal and Circumstantial  THOUGHT CONTENT: Negative Rumination  PERCEPTION: No evidence of hallucinations  CURRENT SUICIDAL IDEATION: patient denies  CURRENT HOMICIDAL IDEATION: Patient denies  ORIENTATION: Alert and Oriented X 3.  CONCENTRATION: Good  MEMORY:   Recent: intact   Remote: intact  COGNITIVE FUNCTION: Average intelligence  JUDGMENT: Intact  IMPULSE CONTROL: Fair  INSIGHT: Fair    Risk Assessment:  ASSESSMENT OF RISK FOR SUICIDAL BEHAVIOR  Changes in risk for suicide from baseline Formulation of Risk and/or previous intake, including newly identified risk, if any: none  Violence risk was assessed and No Change noted from baseline formulation of risk and/or previous assessment.    Session Content:: Patient reports continued distress over situations. Writer asked patient about following up with the group referral and patient said that "it's been  crazy". She said that she has been dealing with Shari's stuff, father's stuff. Patient told Probation officer that her father has to have surgery, have a port put in for dialysis - makes her nervous - no one can be at the hospital for when he has it done. She said that Shari's been "full of anxiety, short, agitated lately" and talked to her PCP about getting her on medication. Patient said that Shari's father fell and couldn't get up - called them to come help and he was in his underwear. She said that she didn't know he called but [name] told her and then she didn't know how to tell [name] how that bugged her so much. Patient said that [facility] didn't allow him to call them - called paramedics - Stanton Kidney told [name] about this afterwards. She said that she can't stop thinking about why he thought it was ok that he wanted to call them to help. Patient said that her family asks about Shari's father because he is ill - feels like what happened to her doesn't matter and it's just another thing that happened to her. Writer validated patient's thoughts and feelings. Writer asked patient what she felt she could do about this. Patient said that it is difficult for her to say to her parents - feels it is disrespectful to them if she reminds them. She said that she talks to them every day and night on facetime. Patient said that she is in pain all the time - spine doctor she went to  several months ago didn't do anything and this is discouraging. Writer validated patient's thoughts and feelings. Writer encouraged patient to consider working on skills to manage distress and symptoms.     Visit Diagnosis:      ICD-10-CM ICD-9-CM   1. Mixed obsessional thoughts and acts  F42.2 300.3   2. Borderline personality disorder  F60.3 301.83   3. MDD (major depressive disorder), recurrent episode, moderate  F33.1 296.32       Interventions:  Motivational Interviewing  Supportive Psychotherapy    Current Treatment Plan   Created/Updated On 12/08/2018   Next  Treatment Plan Due 03/09/2019     Consent was previously obtained from the patient to complete this Video consult; including the potential for financial liability.    Time spent on the  Video with patient: 40+ minutes    Length of time compiling the report:15 minutes    Plan:  Psychotherapy continues as described in care plan; plan remains the same.    NEXT APPT: 04/08/19.      Tresa Res, LCSW-R

## 2019-03-23 ENCOUNTER — Encounter: Payer: Self-pay | Admitting: Registered Nurse

## 2019-03-25 ENCOUNTER — Other Ambulatory Visit: Payer: Self-pay

## 2019-03-25 MED ORDER — TOPIRAMATE 100 MG PO TABS *I*
100.0000 mg | ORAL_TABLET | Freq: Two times a day (BID) | ORAL | 3 refills | Status: DC
Start: 2019-03-25 — End: 2020-05-07

## 2019-03-25 NOTE — Telephone Encounter (Signed)
Last appt: 02/15/2019     Next appt:  04/07/2019        Recent Lab Values 01/04/2018 12/17/2017 10/17/2017 10/17/2017 03/31/2017 10/29/2016 10/17/2016   EXP DATE 9/20 02/02/2018 11-03-18 08-04-18 04/04/18 04/04/2017 08/03/17   [facility] - - - - - - -

## 2019-03-29 NOTE — Progress Notes (Signed)
Pre-Visit Planning    Health Maintenance Due   Topic Date Due    HIV Screening USPSTF/[place]  04/11/1987    Hepatitis C Screening  04/10/1992    Diabetic Foot Exam ADA  02/07/2019       Lab Results   Component Value Date    HA1C 6.7 (H) 11/25/2018     Lab Results   Component Value Date    MALBR <1.20 11/25/2018       Notes:  - Foot exam - DUE    Completed on 03/29/19 by Tanna Savoy

## 2019-04-05 ENCOUNTER — Other Ambulatory Visit: Payer: Self-pay | Admitting: Primary Care

## 2019-04-05 DIAGNOSIS — F319 Bipolar disorder, unspecified: Secondary | ICD-10-CM

## 2019-04-05 DIAGNOSIS — F603 Borderline personality disorder: Secondary | ICD-10-CM

## 2019-04-05 NOTE — Telephone Encounter (Signed)
Requested Prescriptions     Pending Prescriptions Disp Refills    lamoTRIgine (LAMICTAL) 150 MG tablet [Pharmacy Med Name: LAMOTRIGINE 150 MG TABLET] 90 tablet 2     Sig: TAKE 1 TABLET BY MOUTH EVERY DAY       Last OV  02/15/2019    Future appt  04/07/2019    Lab Frequency Next Occurrence

## 2019-04-07 ENCOUNTER — Encounter: Payer: Self-pay | Admitting: Primary Care

## 2019-04-07 ENCOUNTER — Ambulatory Visit: Payer: Medicare (Managed Care) | Admitting: Primary Care

## 2019-04-07 ENCOUNTER — Encounter: Payer: Self-pay | Admitting: Gastroenterology

## 2019-04-07 VITALS — BP 128/72 | HR 73 | Temp 98.3°F | Ht 67.99 in | Wt 260.0 lb

## 2019-04-07 DIAGNOSIS — R059 Cough, unspecified: Secondary | ICD-10-CM

## 2019-04-07 DIAGNOSIS — G90A Postural orthostatic tachycardia syndrome (POTS): Secondary | ICD-10-CM

## 2019-04-07 DIAGNOSIS — J45909 Unspecified asthma, uncomplicated: Secondary | ICD-10-CM

## 2019-04-07 DIAGNOSIS — R05 Cough: Secondary | ICD-10-CM

## 2019-04-07 DIAGNOSIS — E1165 Type 2 diabetes mellitus with hyperglycemia: Secondary | ICD-10-CM

## 2019-04-07 DIAGNOSIS — K580 Irritable bowel syndrome with diarrhea: Secondary | ICD-10-CM

## 2019-04-07 DIAGNOSIS — I498 Other specified cardiac arrhythmias: Secondary | ICD-10-CM

## 2019-04-07 DIAGNOSIS — K219 Gastro-esophageal reflux disease without esophagitis: Secondary | ICD-10-CM

## 2019-04-07 DIAGNOSIS — F419 Anxiety disorder, unspecified: Secondary | ICD-10-CM

## 2019-04-07 MED ORDER — FLUTICASONE PROPIONATE HFA 220 MCG/ACT IN AERO *I*
2.0000 | INHALATION_SPRAY | Freq: Two times a day (BID) | RESPIRATORY_TRACT | 0 refills | Status: DC
Start: 2019-04-07 — End: 2019-05-05

## 2019-04-07 NOTE — Progress Notes (Signed)
Kaylee    YURIKA Harris is a 43 y.o. female who presents, for No chief complaint on file.      Patient present for completion of CDPAS forms    Patient notes that she has been using her albuterol 1-2 times daily.  She notes that she is having dyspnea and wheezing.  She notes that this started about 2 weeks ago.  Reports cough, chest congestion, denies any F/C/N//V/D.  Patient notes relief with the albuterol.  Continues to use flonase and zyrtec daily.  Up at night coughing with chest tightness with an increase in PND.    Patient also notes that she is experiencing mid epi-gastric abdominal discomfort with meals.  She notes that she only eats one meal a day as she isn't hungry and even then has to force herself to do it.  Denies any vomiting.    I personally reviewed the patient's medications, allergies, medical history and updated as appropriate.      Current Outpatient Medications   Medication Sig    lamoTRIgine (LAMICTAL) 150 MG tablet TAKE 1 TABLET BY MOUTH EVERY DAY    topiramate (TOPAMAX) 100 MG tablet Take 1 tablet (100 mg total) by mouth 2 times daily    insulin lispro 100 UNIT/ML injection vial Inject as directed up to MDD 100 units    QUEtiapine (SEROQUEL XR) 50 MG 24 hr tablet Take 1 tablet (50 mg total) by mouth nightly Swallow whole. Do not chew, crush, or break.    cetirizine (ZYRTEC) 10 MG tablet Take 1 tablet (10 mg total) by mouth daily    spironolactone (ALDACTONE) 25 MG tablet Take 1 tablet (25 mg total) by mouth daily    dulaglutide (TRULICITY) 9.92 EQ/6.8TM injection pen Inject 0.5 mLs (0.75 mg total) into the skin every 7 days    pantoprazole (PROTONIX) 40 MG EC tablet TAKE 1 TABLET BY MOUTH EVERY DAY, SWALLOW WHOLE. DO NOT BREAK, CHEW OR CRUSH    DULoxetine (CYMBALTA) 30 MG DR capsule TAKE 1 CAPSULE BY MOUTH TWO TIMES DAILY    ascorbic acid (VITAMIN C) 100 MG tablet Take 5 tablets (500 mg total) by mouth daily    busPIRone (BUSPAR) 30 MG tablet Take 1  tablet (30 mg total) by mouth 2 times daily    famciclovir (FAMVIR) 500 MG tablet Take 1 tablet (500 mg total) by mouth 3 times daily as needed    fluticasone (FLONASE) 50 MCG/ACT nasal spray SPRAY 1 SPRAY INTO EACH NOSTRIL ONE TIME DAILY    azelastine (OPTIVAR) 0.05 % ophthalmic solution Place 1 drop into both eyes 2 times daily    atorvastatin (LIPITOR) 40 MG tablet Take 1 tablet (40 mg total) by mouth daily (with dinner)    metoprolol (TOPROL-XL) 25 MG 24 hr tablet Take by mouth daily       midodrine (PROAMATINE) 10 MG tablet Take 1 tablet (10 mg total) by mouth 3 times daily    fluticasone (FLOVENT HFA) 220 MCG/ACT inhaler Inhale 2 puffs into the lungs 2 times daily Shake well before each use.    albuterol HFA (VENTOLIN HFA) 108 (90 Base) MCG/ACT inhaler Shake well before each use.INHALE 1 TO 2 PUFFS BY MOUTH INTO THE LUNGS EVERY 6 HOURS AS NEEDED FOR WHEEZING. SHAKE WELL BEFORE EACH USE.    clonazePAM (KLONOPIN) 0.5 MG tablet Take 1 tablet (0.5 mg total) by mouth daily as needed Max daily dose: 0.5 mg    insulin syringe-needle U-100 (BD  INSULIN SYRINGE U/F) 31G X 5/16" 0.3 ML HALF-UNIT USE THREE TIMES DAILY AS INSTRUCTED    traMADol (ULTRAM) 50 MG tablet TAKE 1 TABLET BY MOUTH EVERY 8 HOURS AS NEEDED FOR HEAD PAIN AND ABDOMINAL PAIN. MAXIMUM DAILY DOSE 3 TABLETS PER DAY    diphenoxylate-atropine (LOMOTIL) 2.5-0.025 MG per tablet Take 1-2 tablets by mouth 4 times daily as needed for Diarrhea Max daily dose: 8 tablets Code D    clindamycin (CLEOCIN T) 1 % external solution Apply topically 2 times daily Continue until improvement in cysts    blood pressure monitor, automatic with arm cuff Use as directed to monitor blood pressure    Alcohol Swabs (B-D SINGLE USE SWABS REGULAR) PADS USE 1 PAD TOPICALLY TWO TIMES DAILY TO CHECK BLOOD GLUCOSE    incontinence supply disposable Order Description:  Wear as needed for stool leakage    Insulin Disposable Pump (OMNIPOD DASH 5 PACK) MISC By 1 Device no  specified route as needed Change every 2 days = 45 pods for 90 days supply = 9 (5packs)    zinc oxide 20 % ointment Apply topically as needed for Dry Skin    clotrimazole (LOTRIMIN) 1 % cream Apply topically 2 times daily    Continuous Blood Gluc Sensor (DEXCOM G6 SENSOR) MISC By 1 Device no specified route as needed    Continuous Blood Gluc Transmit (DEXCOM G6 TRANSMITTER) MISC By 1 Device no specified route as needed    Continuous Blood Gluc Receiver (DEXCOM G6 RECEIVER) DEVI By 1 Device no specified route as needed    glucagon (BAQSIMI) 3 MG/DOSE nasal powder Inhale one dose (3 mg) into one nostril once as needed for low blood sugar. If no response in 15 min, inhale second dose from new device    promethazine (PHENERGAN) 25 MG tablet Take 1 tablet (25 mg total) by mouth every 4-6 hours as needed    Continuous Blood Gluc Receiver (DEXCOM G6 RECEIVER) DEVI By 1 Device no specified route as needed    blood glucose test strip Test  8 times a day.   Brand name of strips Contour NEXT test strips for linked omnipod pump    lancets (BAYER MICROLET) Use   8  times per day as instructed for blood glucose testing.    SUMAtriptan refill (IMITREX STATDOSE) 6 MG/0.5ML injection INJECT 0.5ML UNDER THE SKIN ONCE AS NEEDED FOR MIGRAINE. MAY REPEAT DOSE ONCE AFTER 1 HOUR    glucagon (GLUCAGEN) 1 MG injection kit Inject 1 mg into the skin once as needed for Low blood sugar   Discard any unused portion.    SUMAtriptan (IMITREX) 50 MG tablet TAKE 1 TABLET BY MOUTH AS NEEDED FOR MIGRAINE, TAKE AT ONSET OF HEADACHE. MAY REPEAT ONCE IN 2 HOURS    SUMAVEL DOSEPRO 6 MG/0.5ML needle-free injection INJECT 0.5MLS (6MG TOTAL) UNDER THE SKIN AS NEEDED FOR MIGRAINE MAY REPEAT ONCE AFTER 1 HOUR IF NEEDED    Non-System Medication The above patient is followed in our clinic and cannot resume work permanently.       ROS  See pertinent HPI      OBJECTIVE  Vitals:    04/07/19 1432   BP: 128/72   BP Location: Right arm   Patient  Position: Sitting   Pulse: 73   Temp: 36.8 C (98.3 F)   SpO2: 98%   Weight: 117.9 kg (260 lb)   Height: 1.727 m (5' 7.99")     Body mass index is 39.54 kg/m.  GENERAL: A&Ox3, pleasant, cooperative, well groomed, appropriately dressed, actively engaged in encounter, NAD.  LYMPH: No cervical or supraclavicular lymphadenopathy.  LUNGS: Normal resp effort. CTAB.  No adventitious breath sounds noted.  Full and symmetric lung expansion.  CARDIAC: RRR, S1 S2, no murmers, gallops, rubs.  No pedal edema. Pulses 2+ b/l.     ABDOMEN: Abdomen non-distended,soft, no palpable masses, BS active x 4, negative tenderness, rebound, or guarding, no CVAT   PSYCH: AAOx3, normal affect and mood. Insight and judgement intact.       ASSESSMENT & PLAN    1. Uncontrolled type 2 diabetes mellitus with hyperglycemia  2. POTS (postural orthostatic tachycardia syndrome)  3. Irritable bowel syndrome with diarrhea  4. Anxiety  CDPAS forms completed.    5. Cough  6. Asthma, unspecified asthma severity, unspecified whether complicated, unspecified whether persistent  Continue the flonase, zyrtec daily.  Will add flovent twice daily for 2 weeks.  Educated patient on red flags and when to seek emergency medical treatment.  Follow up in 2-3 weeks.    7. Gastroesophageal reflux disease without esophagitis  Continue pantoprazole   Work on avoiding triggers.  Replace 1 big meal with 3 small snacks throughout the day.      Patient Instructions   1. flovent twice daily x2 weeks  2. Small snacks 3 times daily instead of a big meal.  3. Continue reflux meds and avoid triggers.  4. Follow up in 2-3 weeks.  5. If fever, cough, chest tightness or shortness of breath worsen please contact us sooner.      Patient Education   Diet for Stomach Ulcers and Gastritis   WHAT YOU NEED TO KNOW:   A diet for stomach ulcers and gastritis is a meal plan that limits foods that irritate your stomach. Certain foods may worsen symptoms such as stomach pain, bloating,  heartburn, or indigestion.   DISCHARGE INSTRUCTIONS:   Foods to limit or avoid:  You may need to avoid acidic, spicy, or high-fat foods. Not all foods affect everyone the same way. You will need to learn which foods worsen your symptoms and limit those foods. The following are some foods that may worsen ulcer or gastritis symptoms:   Beverages:      ? Whole milk and chocolate milk    ? Hot cocoa and cola    ? Any beverage with caffeine    ? Regular and decaffeinated coffee    ? Peppermint and spearmint tea    ? Green and black tea, with or without caffeine    ? Orange and grapefruit juices    ? Drinks that contain alcohol     Spices and seasonings:      ? Black and red pepper    ? Chili powder    ? Mustard seed and nutmeg     Other foods:      ? Dairy foods made from whole milk or cream    ? Chocolate    ? Spicy or strongly flavored cheeses, such as jalapeno or black pepper    ? Highly seasoned, high-fat meats, such as sausage, salami, bacon, ham, and cold cuts    ? Hot chiles and peppers    ? Tomato products, such as tomato paste, tomato sauce, or tomato juice    Foods to include:  Eat a variety of healthy foods from all the food groups. Eat fruits, vegetables, whole grains, and fat-free or low-fat dairy foods. Whole grains include whole-wheat breads, cereals, pasta,  and brown rice. Choose lean meats, poultry (chicken and [place]), fish, beans, eggs, and nuts. A healthy meal plan is low in unhealthy fats, salt, and added sugar. Healthy fats include olive oil and canola oil. Ask your dietitian for more information about a healthy diet.  Other helpful guidelines:    Do not eat right before bedtime.  Stop eating at least 2 hours before bedtime.     Eat small, frequent meals.  Your stomach may tolerate small, frequent meals better than large meals.     Copyright [employer] Information is for [employer] use only and may not be sold, redistributed or otherwise used for commercial purposes. All  illustrations and images included in CareNotes are the copyrighted property of [employer].D[employer]M., Inc[employer] or [place]  The above information is an educational aid only. It is not intended as medical advice for individual conditions or treatments. Talk to your doctor, nurse or pharmacist before following any medical regimen to see if it is safe and effective for you.           Follow-up: No follow-ups on file.      Patient verbalized understanding of information presented,and confirmed agreement with current plan of care.  Reviewed risks, benefits, and administration of prescribed/refilled medications. Precautions reviewed.      Graceann Congress, FNP-C  04/07/2019  4:40 PM

## 2019-04-07 NOTE — Patient Instructions (Signed)
1. flovent twice daily x2 weeks  2. Small snacks 3 times daily instead of a big meal.  3. Continue reflux meds and avoid triggers.  4. Follow up in 2-3 weeks.  5. If fever, cough, chest tightness or shortness of breath worsen please contact us sooner.      Patient Education   Diet for Stomach Ulcers and Gastritis   WHAT YOU NEED TO KNOW:   A diet for stomach ulcers and gastritis is a meal plan that limits foods that irritate your stomach. Certain foods may worsen symptoms such as stomach pain, bloating, heartburn, or indigestion.   DISCHARGE INSTRUCTIONS:   Foods to limit or avoid:  You may need to avoid acidic, spicy, or high-fat foods. Not all foods affect everyone the same way. You will need to learn which foods worsen your symptoms and limit those foods. The following are some foods that may worsen ulcer or gastritis symptoms:   Beverages:      ? Whole milk and chocolate milk    ? Hot cocoa and cola    ? Any beverage with caffeine    ? Regular and decaffeinated coffee    ? Peppermint and spearmint tea    ? Green and black tea, with or without caffeine    ? Orange and grapefruit juices    ? Drinks that contain alcohol     Spices and seasonings:      ? Black and red pepper    ? Chili powder    ? Mustard seed and nutmeg     Other foods:      ? Dairy foods made from whole milk or cream    ? Chocolate    ? Spicy or strongly flavored cheeses, such as jalapeno or black pepper    ? Highly seasoned, high-fat meats, such as sausage, salami, bacon, ham, and cold cuts    ? Hot chiles and peppers    ? Tomato products, such as tomato paste, tomato sauce, or tomato juice    Foods to include:  Eat a variety of healthy foods from all the food groups. Eat fruits, vegetables, whole grains, and fat-free or low-fat dairy foods. Whole grains include whole-wheat breads, cereals, pasta, and brown rice. Choose lean meats, poultry (chicken and [place]), fish, beans, eggs, and nuts. A healthy meal plan is low in unhealthy fats, salt,  and added sugar. Healthy fats include olive oil and canola oil. Ask your dietitian for more information about a healthy diet.  Other helpful guidelines:    Do not eat right before bedtime.  Stop eating at least 2 hours before bedtime.     Eat small, frequent meals.  Your stomach may tolerate small, frequent meals better than large meals.     Copyright [employer] Information is for [employer] use only and may not be sold, redistributed or otherwise used for commercial purposes. All illustrations and images included in CareNotes are the copyrighted property of [employer] Inc[employer] or [place]  The above information is an educational aid only. It is not intended as medical advice for individual conditions or treatments. Talk to your doctor, nurse or pharmacist before following any medical regimen to see if it is safe and effective for you.

## 2019-04-08 ENCOUNTER — Ambulatory Visit: Payer: Medicare (Managed Care) | Attending: Psychiatry

## 2019-04-08 ENCOUNTER — Encounter: Payer: Self-pay | Admitting: Psychiatry

## 2019-04-08 DIAGNOSIS — F331 Major depressive disorder, recurrent, moderate: Secondary | ICD-10-CM | POA: Insufficient documentation

## 2019-04-08 DIAGNOSIS — F603 Borderline personality disorder: Secondary | ICD-10-CM | POA: Insufficient documentation

## 2019-04-08 DIAGNOSIS — F422 Mixed obsessional thoughts and acts: Secondary | ICD-10-CM

## 2019-04-08 DIAGNOSIS — F4312 Post-traumatic stress disorder, chronic: Secondary | ICD-10-CM | POA: Insufficient documentation

## 2019-04-08 NOTE — Progress Notes (Signed)
Behavioral Health Progress Note   Patient encounter was performed via Video     Patient located at: home    Provider located at: home office    Other participants in this encounter and roles: N/A    Consent was previously obtained from the patient to complete this service via telehealth; including the potential for financial liability.  Length of Session: 45 minutes    Contact Type:  Location: Telemedicine Covid-19    Telephone Contact     Problem(s)/Goals Addressed from Treatment Plan:    Problem 1:   Treatment Problem #1 04/08/2019   Patient Identified Problem OCD       Goal for this problem:    Treatment Goal #1 04/08/2019   Patient Identified Goal Utilize CBT to reduce behaviors       Progress towards this goal: Problem resolving. Comment: Patient reports some ability to regulate mood.    Mental Status Exam:  APPEARANCE: Appears stated age, Well-groomed, Casual  ATTITUDE TOWARD INTERVIEWER: Cooperative  MOTOR ACTIVITY: WNL (within normal limits)  EYE CONTACT: Direct  SPEECH: Normal rate and tone  AFFECT: Full Range  MOOD: Anxious and Depressed  THOUGHT PROCESS: Normal and Circumstantial  THOUGHT CONTENT: No unusual themes  PERCEPTION: No evidence of hallucinations  CURRENT SUICIDAL IDEATION: patient denies  CURRENT HOMICIDAL IDEATION: Patient denies  ORIENTATION: Alert and Oriented X 3.  CONCENTRATION: Good  MEMORY:   Recent: intact   Remote: intact  COGNITIVE FUNCTION: Average intelligence  JUDGMENT: Intact  IMPULSE CONTROL: Good and Fair  INSIGHT: Fair    Risk Assessment:  ASSESSMENT OF RISK FOR SUICIDAL BEHAVIOR  Changes in risk for suicide from baseline Formulation of Risk and/or previous intake, including newly identified risk, if any: none  Violence risk was assessed and No Change noted from baseline formulation of risk and/or previous assessment.    Session Content:: Patient reports some ability to regulate mood. Patient told Probation officer that her father is not doing well. She said that her brother told her  that he is worried about him "giving up". Writer validated patient. Patient talked about her father's health and Probation officer provided support. She said that he is not doing well but acknowledged that her father is a Management consultant". Patient said that they went to her sister's for Thanksgiving and this was very hard b/c they had to help their father up and down the stairs. Patient said that one of her aunts has been in the hospital for four days with Covid. Patient said that her family wants to get together for her birthday - [name] doesn't want to be around "everybody" - doesn't want anyone in their house anymore but is asking her what she wants to do for her birthday. Writer and patient discussed ways in which patient can address this with her family and wife. Patient said that she has concerns about Shari's memory and is worried her MS is getting worse. Writer had patient examine the facts, reframe this in view of the pandemic and others experiencing memory issues as well.      Visit Diagnosis:      ICD-10-CM ICD-9-CM   1. Mixed obsessional thoughts and acts  F42.2 300.3   2. Borderline personality disorder  F60.3 301.83   3. MDD (major depressive disorder), recurrent episode, moderate  F33.1 296.32   4. Chronic post-traumatic stress disorder (PTSD)  F43.12 309.81       Interventions:  Supportive Psychotherapy  Taught/practiced coping skills (specify skills used):  observe limits, Mindfulness, Distress tolerance  Taught/practiced communication skills (specify skills used):  Interpersonal    Current Treatment Plan   Created/Updated On 04/08/2019   Next Treatment Plan Due 07/06/2019     Consent was previously obtained from the patient to complete this Video consult; including the potential for financial liability.    Time spent on the  Video with patient: 40+ minutes    Length of time compiling the report:15 minutes    Plan:  Psychotherapy continues as described in care plan; plan remains the same.    NEXT APPT:  04/22/19.      Tresa Res, LCSW-R

## 2019-04-08 NOTE — BH Treatment Plan (Signed)
[facility] Treatment Plan     Date of Plan:   Created/Updated On 04/08/2019   FROM 04/08/2019      Created/Updated On 04/08/2019   Next Treatment Plan Due 07/06/2019       Diagnostic Impression    ICD-10-CM ICD-9-CM   1. Mixed obsessional thoughts and acts  F42.2 300.3   2. Borderline personality disorder  F60.3 301.83   3. MDD (major depressive disorder), recurrent episode, moderate  F33.1 296.32   4. Chronic post-traumatic stress disorder (PTSD)  F43.12 309.81       Strengths  Strengths derived from the assessment include: Patient is hard working, has supports.    Problem Areas  *At least one problem must be targeted toward risk reduction if Formulation of Risk or any other previous exam indicated special monitoring or intervention for suicide and/or violence risk indicated.    PROBLEM AREAS (choose and describe relevant):  THOUGHT: Negative  MOOD:Depressed and anxious  BEHAVIOR: Reactive and avoidant  ECONOMIC:SSDI  ________________________________________________________________  Treatment Problem #1 04/08/2019   Patient Identified Problem OCD        Treatment Goal #1 04/08/2019   Patient Identified Goal Utilize CBT to reduce behaviors       The rationale for addressing this problem is that resolving it will (select all that apply):  Reduce symptoms of disorder, Reduce functional impairment associated with disorder, Decrease likelihood of hospitalization, Facilitate transfer skills learned in therapy to everday life, Is a key motivational factor for the patient's participation in treatment, Reduce risk for suicide* and Reduce risk for violence*      Progress toward goal(s): Problem worsening.  Comment: Patient has remained in treatment. Patient continues to work to address stress related to health issues, her father's ongoing health issues. Patient is not on the Suicide Care Pathway. Patient not available to sign treatment plan due to Covid-19.    1 a. Measurable Objectives : Patient will make and keep  regular individual therapy appointments.   Date established: 05/31/14   Target date: 07/06/19.   Attained or Revised? continue    1 b. Measurable Objectives : Patient will continue to learn and utilize skills to manage affect.   Date established: 08/31/18   Target date: 07/06/19.   Attained or Revised? continue    ______________________________________________________________________      Plan  TREATMENT MODALITIES:  Individual psychotherapy for 30-60 min Q 1-3 weeks with Tresa Res, LCSW.    DISCHARGE CRITERIA for this treatment setting: Patient reports a reduction in symptoms as evidenced by PHQ-9 and GAD-7 scores.    Clinician's name: Tresa Res, LCSW    Psychiatrist's Name: Donny Pique, MD      Patient/Family Statement  PATIENT/FAMILY STATEMENT:  Obtain patient and family input into the treatment plan, including areas of agreement / disagreement.  Obtain patient's signature - if not possible, briefly describe the reason.     Patient Comments:          I HAVE PARTICIPATED IN THE DEVELOPMENT OF THIS TREATMENT PLAN AND I AGREE WITH ITS CONTENTS:       Patient Signature: _______________________________________________________    Date: ______________________________

## 2019-04-09 ENCOUNTER — Other Ambulatory Visit: Payer: Self-pay

## 2019-04-09 DIAGNOSIS — Z760 Encounter for issue of repeat prescription: Secondary | ICD-10-CM

## 2019-04-09 NOTE — Telephone Encounter (Signed)
Last appt: 04/07/2019     Next appt:  05/05/2019        Recent Lab Values 01/04/2018 12/17/2017 10/17/2017 10/17/2017 03/31/2017 10/29/2016 10/17/2016   EXP DATE 9/20 02/02/2018 11-03-18 08-04-18 04/04/18 04/04/2017 08/03/17   [facility] - - - - - - -     Please complete I-stop and forward to the doctor.

## 2019-04-09 NOTE — Telephone Encounter (Signed)
Patient Name: Kaylee Harris   Birth Date: 14-Feb-1976   Address: [place], [place] [address]   Sex: Female   Rx Written Rx Dispensed Drug Quantity Days Supply Prescriber Name Payment Method Dispenser   02/26/2019 03/02/2019 clonazepam 0.5 mg tablet  28 28 [place], [place]. #19   02/25/2019 02/26/2019 diphenoxylate-atropine 2.5-0.025 mg tablet  240 30 Burke, [place]. #19   02/25/2019 02/25/2019 tramadol hcl 50 mg tablet  42 14 Burke, [place]. #19   01/26/2019 01/29/2019 tramadol hcl 50 mg tablet  42 14 Burke, [place]. #19   12/18/2018 12/21/2018 tramadol hcl 50 mg tablet  42 14 [name], Darnell Level (MD) [place]. #19   11/12/2018 11/15/2018 diphenoxylate-atropine 2.5-0.025 mg tablet  240 30 [place], [place]. #19   10/13/2018 10/15/2018 tramadol hcl 50 mg tablet  42 14 Burke, [place]. #19   08/24/2018 08/27/2018 clonazepam 0.5 mg tablet  28 28 [place], [place]. #19   08/20/2018 08/21/2018 tramadol hcl 50 mg tablet  42 14 [place], [place]. #19   07/22/2018 07/22/2018 clonazepam 0.5 mg tablet  28 28 [name], Darnell Level (MD) [place]. #02   05/27/2018 06/02/2018 tramadol hcl 50 mg tablet  42 14 [place], [place]. [employer]

## 2019-04-10 ENCOUNTER — Encounter: Payer: Self-pay | Admitting: Primary Care

## 2019-04-10 MED ORDER — TRAMADOL HCL 50 MG PO TABS *I*
ORAL_TABLET | ORAL | 0 refills | Status: DC
Start: 2019-04-10 — End: 2019-05-12

## 2019-04-12 ENCOUNTER — Encounter: Payer: Self-pay | Admitting: Primary Care

## 2019-04-12 DIAGNOSIS — R109 Unspecified abdominal pain: Secondary | ICD-10-CM

## 2019-04-12 DIAGNOSIS — R197 Diarrhea, unspecified: Secondary | ICD-10-CM

## 2019-04-12 NOTE — Telephone Encounter (Signed)
Patient called in requesting a callback from [facility] regarding message below. Please advise

## 2019-04-13 ENCOUNTER — Other Ambulatory Visit
Admission: RE | Admit: 2019-04-13 | Discharge: 2019-04-13 | Disposition: A | Discharge status: Home / Self Care | Payer: Medicare (Managed Care) | Source: Ambulatory Visit

## 2019-04-13 ENCOUNTER — Other Ambulatory Visit: Payer: Self-pay | Admitting: Gastroenterology

## 2019-04-13 ENCOUNTER — Other Ambulatory Visit
Admission: RE | Admit: 2019-04-13 | Discharge: 2019-04-13 | Disposition: A | Discharge status: Home / Self Care | Payer: Medicare (Managed Care) | Source: Ambulatory Visit | Attending: Primary Care | Admitting: Primary Care

## 2019-04-13 ENCOUNTER — Encounter: Payer: Self-pay | Admitting: Primary Care

## 2019-04-13 ENCOUNTER — Encounter: Payer: Self-pay | Admitting: Obstetrics and Gynecology

## 2019-04-13 DIAGNOSIS — R197 Diarrhea, unspecified: Secondary | ICD-10-CM | POA: Insufficient documentation

## 2019-04-13 DIAGNOSIS — R109 Unspecified abdominal pain: Secondary | ICD-10-CM | POA: Insufficient documentation

## 2019-04-13 DIAGNOSIS — Z1211 Encounter for screening for malignant neoplasm of colon: Secondary | ICD-10-CM | POA: Insufficient documentation

## 2019-04-13 LAB — COMPREHENSIVE METABOLIC PANEL
ALT: 14 U/L (ref 0–35)
AST: 14 U/L (ref 0–35)
Albumin: 4.7 g/dL (ref 3.5–5.2)
Alk Phos: 115 U/L — ABNORMAL HIGH (ref 35–105)
Anion Gap: 11 (ref 7–16)
Bilirubin,Total: 0.5 mg/dL (ref 0.0–1.2)
CO2: 22 mmol/L (ref 20–28)
Calcium: 9.8 mg/dL (ref 8.6–10.2)
Chloride: 104 mmol/L (ref 96–108)
Creatinine: 0.98 mg/dL — ABNORMAL HIGH (ref 0.51–0.95)
GFR,Black: 81 *
GFR,Caucasian: 70 *
Glucose: 95 mg/dL (ref 60–99)
Lab: 9 mg/dL (ref 6–20)
Potassium: 4.7 mmol/L (ref 3.3–5.1)
Sodium: 137 mmol/L (ref 133–145)
Total Protein: 6.8 g/dL (ref 6.3–7.7)

## 2019-04-13 LAB — CBC AND DIFFERENTIAL
Baso # K/uL: 0.1 10*3/uL (ref 0.0–0.1)
Basophil %: 0.7 %
Eos # K/uL: 0.2 10*3/uL (ref 0.0–0.4)
Eosinophil %: 1.4 %
Hematocrit: 42 % (ref 34–45)
Hemoglobin: 13.4 g/dL (ref 11.2–15.7)
IMM Granulocytes #: 0.1 10*3/uL — ABNORMAL HIGH (ref 0.0–0.0)
IMM Granulocytes: 0.8 %
Lymph # K/uL: 3.3 10*3/uL (ref 1.2–3.7)
Lymphocyte %: 22.6 %
MCH: 27 pg/cell (ref 26–32)
MCHC: 32 g/dL (ref 32–36)
MCV: 86 fL (ref 79–95)
Mono # K/uL: 0.6 10*3/uL (ref 0.2–0.9)
Monocyte %: 4.1 %
Neut # K/uL: 10.4 10*3/uL — ABNORMAL HIGH (ref 1.6–6.1)
Nucl RBC # K/uL: 0 10*3/uL (ref 0.0–0.0)
Nucl RBC %: 0 /100 WBC (ref 0.0–0.2)
Platelets: 430 10*3/uL — ABNORMAL HIGH (ref 160–370)
RBC: 4.9 MIL/uL (ref 3.9–5.2)
RDW: 13.1 % (ref 11.7–14.4)
Seg Neut %: 70.4 %
WBC: 14.8 10*3/uL — ABNORMAL HIGH (ref 4.0–10.0)

## 2019-04-13 LAB — CLOSTRIDIUM DIFFICILE EIA

## 2019-04-13 LAB — AMYLASE: Amylase: 53 U/L (ref 28–100)

## 2019-04-13 LAB — LIPASE: Lipase: 49 U/L (ref 13–60)

## 2019-04-13 NOTE — Telephone Encounter (Signed)
Already addressed by BB in separate mychart messages today, offered appt tomorrow.     [name], MD  [facility]  04/13/2019  7:18 PM

## 2019-04-13 NOTE — Addendum Note (Signed)
Addended by: Daine Floras on: 04/13/2019 03:40 PM     Modules accepted: Orders

## 2019-04-13 NOTE — Telephone Encounter (Signed)
I spoke to the patient and asked her to read her mychart

## 2019-04-13 NOTE — Telephone Encounter (Signed)
Please call patient and let her know that I sent her a mychart message.

## 2019-04-13 NOTE — Telephone Encounter (Signed)
Please schedule patient tomorrow at 420 with me for abdominal pain.  See mychart if needed for details.  Please call to confirm appt with her.  Thanks

## 2019-04-14 ENCOUNTER — Encounter: Payer: Self-pay | Admitting: Emergency Medicine

## 2019-04-14 ENCOUNTER — Emergency Department
Admission: EM | Admit: 2019-04-14 | Discharge: 2019-04-14 | Disposition: A | Discharge status: Home / Self Care | Payer: Medicare (Managed Care) | Source: Ambulatory Visit | Attending: Emergency Medicine | Admitting: Emergency Medicine

## 2019-04-14 ENCOUNTER — Ambulatory Visit: Payer: Medicare (Managed Care) | Admitting: Dermatology

## 2019-04-14 ENCOUNTER — Ambulatory Visit: Payer: Medicare (Managed Care) | Admitting: Primary Care

## 2019-04-14 ENCOUNTER — Emergency Department: Payer: Medicare (Managed Care)

## 2019-04-14 VITALS — BP 120/90 | HR 70 | Temp 98.0°F | Ht 68.0 in | Wt 258.0 lb

## 2019-04-14 DIAGNOSIS — R1033 Periumbilical pain: Secondary | ICD-10-CM | POA: Insufficient documentation

## 2019-04-14 DIAGNOSIS — R109 Unspecified abdominal pain: Secondary | ICD-10-CM

## 2019-04-14 DIAGNOSIS — R1011 Right upper quadrant pain: Secondary | ICD-10-CM | POA: Insufficient documentation

## 2019-04-14 DIAGNOSIS — Z9089 Acquired absence of other organs: Secondary | ICD-10-CM | POA: Insufficient documentation

## 2019-04-14 DIAGNOSIS — Z9049 Acquired absence of other specified parts of digestive tract: Secondary | ICD-10-CM | POA: Insufficient documentation

## 2019-04-14 LAB — POCT URINALYSIS DIPSTICK
Blood,UA POCT: NEGATIVE
Glucose,UA POCT: NORMAL mg/dL
Ketones,UA POCT: NEGATIVE mg/dL
Leuk Esterase,UA POCT: NEGATIVE
Lot #: 44399502
Nitrite,UA POCT: NEGATIVE
PH,UA POCT: 6 (ref 5–8)

## 2019-04-14 LAB — BASIC METABOLIC PANEL
Anion Gap: 14 (ref 7–16)
CO2: 20 mmol/L (ref 20–28)
Calcium: 9.7 mg/dL (ref 8.6–10.2)
Chloride: 105 mmol/L (ref 96–108)
Creatinine: 0.81 mg/dL (ref 0.51–0.95)
GFR,Black: 101 *
GFR,Caucasian: 88 *
Glucose: 94 mg/dL (ref 60–99)
Lab: 8 mg/dL (ref 6–20)
Potassium: 4.2 mmol/L (ref 3.3–5.1)
Sodium: 139 mmol/L (ref 133–145)

## 2019-04-14 LAB — CBC AND DIFFERENTIAL
Baso # K/uL: 0.1 10*3/uL (ref 0.0–0.1)
Basophil %: 0.8 %
Eos # K/uL: 0.2 10*3/uL (ref 0.0–0.4)
Eosinophil %: 1.2 %
Hematocrit: 43 % (ref 34–45)
Hemoglobin: 13.6 g/dL (ref 11.2–15.7)
IMM Granulocytes #: 0.1 10*3/uL — ABNORMAL HIGH (ref 0.0–0.0)
IMM Granulocytes: 0.9 %
Lymph # K/uL: 3.7 10*3/uL (ref 1.2–3.7)
Lymphocyte %: 24 %
MCH: 28 pg (ref 26–32)
MCHC: 32 g/dL (ref 32–36)
MCV: 87 fL (ref 79–95)
Mono # K/uL: 0.8 10*3/uL (ref 0.2–0.9)
Monocyte %: 5.4 %
Neut # K/uL: 10.3 10*3/uL — ABNORMAL HIGH (ref 1.6–6.1)
Nucl RBC # K/uL: 0 10*3/uL (ref 0.0–0.0)
Nucl RBC %: 0 /100 WBC (ref 0.0–0.2)
Platelets: 410 10*3/uL — ABNORMAL HIGH (ref 160–370)
RBC: 5 MIL/uL (ref 3.9–5.2)
RDW: 13.3 % (ref 11.7–14.4)
Seg Neut %: 67.7 %
WBC: 15.2 10*3/uL — ABNORMAL HIGH (ref 4.0–10.0)

## 2019-04-14 LAB — RUQ PANEL (ED ONLY)
ALT: 13 U/L (ref 0–35)
AST: 12 U/L (ref 0–35)
Albumin: 4.5 g/dL (ref 3.5–5.2)
Alk Phos: 103 U/L (ref 35–105)
Amylase: 54 U/L (ref 28–100)
Bilirubin,Direct: <0.2 mg/dL (ref 0.0–0.3)
Bilirubin,Total: 0.3 mg/dL (ref 0.0–1.2)
Lipase: 44 U/L (ref 13–60)
Total Protein: 7.3 g/dL (ref 6.3–7.7)

## 2019-04-14 LAB — CLOSTRIDIUM DIFFICILE EIA: C difficile Toxins A and B EIA: 0

## 2019-04-14 LAB — PREGNANCY TEST, SERUM: Preg,Serum: NEGATIVE

## 2019-04-14 MED ORDER — SODIUM CHLORIDE 0.9 % FLUSH FOR PUMPS *I*
0.0000 mL/h | INTRAVENOUS | Status: DC | PRN
Start: 2019-04-14 — End: 2019-04-15

## 2019-04-14 MED ORDER — ACETAMINOPHEN 325 MG PO TABS *I*
975.0000 mg | ORAL_TABLET | Freq: Once | ORAL | Status: AC
Start: 2019-04-14 — End: 2019-04-14
  Administered 2019-04-14: 975 mg via ORAL
  Filled 2019-04-14: qty 3

## 2019-04-14 MED ORDER — DEXTROSE 5 % FLUSH FOR PUMPS *I*
0.0000 mL/h | INTRAVENOUS | Status: DC | PRN
Start: 2019-04-14 — End: 2019-04-15

## 2019-04-14 MED ORDER — PROMETHAZINE HCL 25 MG PO TABS *I*
25.0000 mg | ORAL_TABLET | Freq: Once | ORAL | Status: AC
Start: 2019-04-14 — End: 2019-04-14
  Administered 2019-04-14: 25 mg via ORAL
  Filled 2019-04-14: qty 1

## 2019-04-14 NOTE — ED Triage Notes (Signed)
Pt. C/o of LLQ and RLQ abd pain. Pt. States having stool sample and blood work drawn today and was told liver enzymes were abnormal. Pt. Was sent to ED by PCP for further evaluation. Pt. States pain is worse with meals. Pt. States recent abx for the past month. Pt. R/o for C.DIFF.        Triage Note   Alric Quan, RN

## 2019-04-14 NOTE — ED Provider Notes (Signed)
History     Chief Complaint   Patient presents with    Abdominal Pain     Patient is a 43 yo female with a PMH of anxiety, depression, DM, fibromyalgia, GERD who presents to the ED with abdominal pain. She states she has had a week of abdominal pain. States it started in the epigastric are then moved to the LLQ now has RUQ pain. She states she had labs done yesterday and that her liver enzymes were high however upon chart review they were not elevated. She was seen in the office this afternoon and patient reports she was sent to the ED for further workup. Office note incomplete at this time.      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 Lower abdomen with cellulitis changes, wound probed and purulent material expressed.  Had PICC line for "multiple infiltrations" of what? D/c-ed home on 10d of Augmentin 9/11.      Anginal pain     Anxiety     Arthritis     Asthma     Depression     bipolar    Diabetes mellitus     Previously on SU and metformin, now diet controlled    Diverticulitis 09/2010    Dysfunctional uterine bleeding     Fibromyalgia     GERD (gastroesophageal reflux disease)     GERD (gastroesophageal reflux disease) 10/04/2016    High blood pressure     Hyperlipidemia     Liver disease     fatty liver     Long term (current) use of insulin, Dermal Adhesed V-Go Insulin Delivery Device 10/04/2016    Migraine     Neuromuscular disorder     POTS (postural orthostatic tachycardia syndrome)     Sebaceous cyst of breast     right axilla    TMJ click, left [id number]    Varicella         Patient Active Problem List   Diagnosis Code    DM (diabetes mellitus), type 2, uncontrolled E11.65    Migraine with aura G43.109    Asthma J45.909    Hyperlipemia E78.5    Fibromyalgia  M79.7    Syncope and collapse- recurrent, working diagnosis vascular vs psychogenic. 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    IUD (intrauterine device) in place Z97.5    IBS (irritable bowel syndrome) K58.9    Mixed obsessional thoughts and acts F42.2    Carpal tunnel syndrome G56.00    Meralgia paresthetica of left side G57.12    No diabetic retinopathy in both eyes [id number]    At risk for abuse of opiates Z91.89    History of repeated overdose Z91.89    Borderline personality disorder F60.3    Fatty liver disease, nonalcoholic [id number]    GERD (gastroesophageal reflux disease) 123456    TMJ click, left [id number]    Anxiety F41.9    Bipolar 1 disorder F31.9    POTS (postural orthostatic tachycardia syndrome) I49.8    Traumatic hemorrhage of right cerebrum S06.349A    Diverticulitis K57.92    Long term (current) use of insulin, Dermal Adhesed V-Go Insulin Delivery Device Z79.4    Morbid  obesity with BMI of 40.0-44.9, adult E66.01, Z68.41    Hematuria R31.9    Right hip pain M25.551    Labial abscess N76.4    Lumbar facet arthropathy M47.816    First degree heart block I44.0            Past Surgical History:   Procedure Laterality Date    APPENDECTOMY  2016    arthroscopic shoulder surgery Right 2010    Related to lifting    CARDIAC CATHETERIZATION  09/2011    negative    COLON SURGERY      COSMETIC SURGERY Bilateral 12/12/2017    B/L internal maxillary arteries embolized due to recurrent epistaxis, Dr Linard Millers    DILATION AND CURETTAGE OF UTERUS  2005, 2007    x2 for menorraghia    LEFT COLECTOMY  01/03/14    Dr Tresa Res    loop recorder  Feb 03 2014    loop recorder removal      PR COLONOSCOPY THRU COLOTOMY N/A 02/16/2016    Procedure: COLONOSCOPY;  Surgeon: Kelly Splinter, MD;  Location: [facility];  Service: GI    PR CYSTOURETHROSCOPY,BIOPSY N/A 10/17/2016    Procedure: CYSTOSCOPY BLADDER ;  Surgeon:  Ed Blalock, MD;  Location: [facility];  Service: Urology    PR EDG TRANSORAL BIOPSY SINGLE/MULTIPLE N/A 10/29/2016    Procedure: EGD;  Surgeon: Kelly Splinter, MD;  Location: [facility];  Service: GI    TONSILLECTOMY       Family History   Problem Relation Age of Onset    Hypertension Father     Diabetes Father     Kidney Disease Father     Hyperlipidemia Father     Heart attack Father 30    Other Father         PVD    Heart Disease Father     Hypertension Mother     Hyperlipidemia Mother     Heart attack Mother 93    Diabetes Mother     Eczema Mother     Psoriasis Mother     Heart Disease Mother     Hypertension Brother     Heart Disease Brother 60        prinzmetal's angina    Heart Disease Sister         currently having work up    Redan Sister     Hypertension Sister     Breast cancer Maternal Grandmother     Stroke Other     Thyroid disease Sister     Thyroid cancer Sister     Anesthesia problems Neg Hx           Social History     Tobacco Use    Smoking status: Former Smoker     Packs/day: 0.50     Years: 3.00     Pack years: 1.50     Types: Cigarettes    Smokeless tobacco: Never Used    Tobacco comment: quit age late 4s   Substance Use Topics    Alcohol use: Yes     Alcohol/week: 0.0 standard drinks     Comment: 0-1 drink/ month    Drug use: No     Living Situation     Questions Responses    Patient lives with Spouse    Comment: Lives with Wife, [name]     Homeless No    Caregiver for other family member No    External  Services Mental Health Services    Comment: Agency     Employment Disabled    Domestic Violence Risk                 Review of Systems   Review of Systems   Constitutional: Negative for chills and fever.   Respiratory: Negative for cough and shortness of breath.    Cardiovascular: Negative for chest pain.   Gastrointestinal: Positive for abdominal pain and nausea (at baselin). Negative for blood in stool and vomiting.    Genitourinary: Negative for dysuria, flank pain and frequency.   Musculoskeletal: Negative for myalgias.   Neurological: Negative for weakness.   All other systems reviewed and are negative.      Physical Exam     Triage Vitals  Triage Start: Start, (04/14/19 1738)   First Recorded BP: (!) 150/114, Resp: 16, Temp: 36.4 C (97.5 F), Temp src: TEMPORAL Oxygen Therapy SpO2: 100 %, O2 Device: None (Room air), Heart Rate: 87, (04/14/19 1741)  .  First Pain Reported  0-10 Scale: 8, Pain Location/Orientation: Abdomen, (04/14/19 1741)       Physical Exam  Vitals signs and nursing note reviewed.   Constitutional:       General: She is not in acute distress.     Appearance: She is well-developed.   HENT:      Head: Normocephalic and atraumatic.   Eyes:      Conjunctiva/sclera: Conjunctivae normal.   Neck:      Musculoskeletal: Normal range of motion and neck supple.   Cardiovascular:      Rate and Rhythm: Normal rate and regular rhythm.      Heart sounds: Normal heart sounds.   Pulmonary:      Effort: Pulmonary effort is normal.      Breath sounds: Normal breath sounds.   Abdominal:      Palpations: Abdomen is soft.      Tenderness: There is abdominal tenderness in the right upper quadrant and periumbilical area. There is no guarding or rebound.   Musculoskeletal: Normal range of motion.   Skin:     General: Skin is warm and dry.   Neurological:      Mental Status: She is alert and oriented to person, place, and time.         Medical Decision Making   Patient seen by me on:  04/14/2019    Assessment:  43 yo female who presents to the ED with abdominal pain x 1 week, location varies, currently in RUQ. On exam she has mild tenderness, no guarding or rigidity.    Differential diagnosis:  Nonspecific abdominal pain,  cholelithiasis, cholecystitis, gastritis, pancreatitis, UTI, viral syndrome    Plan:  CBC, BMP, RUQ, [place], tylenol    ED Course and Disposition:  Labs Reviewed  CBC AND DIFFERENTIAL - Abnormal; Notable for the  following components:     WBC                           15.2 (*)               Platelets                     410 (*)                Neut # K/uL  10.3 (*)               IMM Granulocytes #            0.1 (*)             All other components within normal limits  POCT URINALYSIS DIPSTICK - Abnormal; Notable for the following components:     Protein,UA POCT               Trace (*)               Bilirubin,Ur                    (*)               All other components within normal limits  BASIC METABOLIC PANEL  RUQ PANEL (ED ONLY)  PREGNANCY TEST, SERUM    US abdomen limited single quad or f/u specify    (Results Pending)    RADIOLOGY PRELIM NOTE:      By: Rolla Plate    No cholelithiasis or secondary sonographic findings to suggest acute cholecystitis. No biliary ductal dilatation. Mild hepatic steatosis. Full final attending report to follow in the morning.    Patient updated of their results, they are agreeable with plan for discharge and follow up with their PCP. Return precautions discussed and they will return to the ED if their condition worsens.              Melvern Sample, PA          Kandice Hams [place], [place]  04/14/19 2143

## 2019-04-14 NOTE — Telephone Encounter (Signed)
To clarify it looks like she is to take Doxycycline once daily, will try to update in medication list

## 2019-04-14 NOTE — Discharge Instructions (Signed)
Your blood work was reassuring. Your ultrasound showed no acute biliary disease. Take Tylenol for pain,monitor for fevers, bloody stools, vomiting.    Follow up with your primary care physician within 1 week. Continue your home medications as prescribed. Review the attached information thoroughly. Do not hesitate to return to the emergency department if your condition worsens.

## 2019-04-14 NOTE — ED Notes (Signed)
Pt presents c/o N/V/D, RLQ and LLQ pain. Endorses loss of appetite and frequent diarrhea episodes. Saw PCP yesterday, states when they pressed on the r. Side of abdomen, she recoiled in pain. Pt has taken 4 different abx in past month, currently takes doxycycline regularly. States her PCP said she cannot take NSAID's d/t presence of coils in nares.

## 2019-04-14 NOTE — Progress Notes (Signed)
Kaylee    TERRAN Harris is a 43 y.o. female who presents, for Abdominal Pain (Started on LLQ, Now RUQ, On both sides. (does not have an appendix) Does eat sushi, but it's cooked, i.e. rice cooked shrimp, tomato)    Patient presents to the office with complaints of abdominal pain that is increasing in intensity and spreading.  Patient was seen by me on 12/2 and had noted some abdominal discomfort but nothing that was significantly new for her.  She reports that it has just gotten worse.  She notes that the pain had started in the LLQ/pelvic area a week ago but has now spread to both sides in a generalized approach.  Patient notes that she had had diarrhea that was dark in color but notes that this was different than her IBS diarrhea.  She denies any blood in her stool.  Patient also notes that she had an increase in nausea.  Patient notes that she is no longer having diarrhea.  Notes that she constantly has the urge to go and when she goes she only goes a little.  Patient notes that the abdominal pain has gotten worse despite taking tramadol, tylenol and using heat for pain.  Denies any F/C/V.  Patient denies any urinary or vaginal symptoms.      Results for Kaylee Harris (MRN [id number]) as of 04/15/2019 12:44   Ref. Range 04/13/2019 16:23   Sodium Latest Ref Range: 133 - 145 mmol/L 137   Potassium Latest Ref Range: 3.3 - 5.1 mmol/L 4.7   Chloride Latest Ref Range: 96 - 108 mmol/L 104   CO2 Latest Ref Range: 20 - 28 mmol/L 22   Anion Gap Latest Ref Range: 7 - 16  11   UN Latest Ref Range: 6 - 20 mg/dL 9   Creatinine Latest Ref Range: 0.51 - 0.95 mg/dL 0.98 (H)   GFR,Black Latest Units: * 81   GFR,Caucasian Latest Units: * 70   Glucose Latest Ref Range: 60 - 99 mg/dL 95   Calcium Latest Ref Range: 8.6 - 10.2 mg/dL 9.8   Total Protein Latest Ref Range: 6.3 - 7.7 g/dL 6.8   Albumin Latest Ref Range: 3.5 - 5.2 g/dL 4.7   ALT Latest Ref Range: 0 - 35 U/L 14   AST Latest Ref Range: 0 - 35 U/L  14   Alk Phos Latest Ref Range: 35 - 105 U/L 115 (H)   Amylase Latest Ref Range: 28 - 100 U/L 53   Bilirubin,Total Latest Ref Range: 0.0 - 1.2 mg/dL 0.5   Lipase Latest Ref Range: 13 - 60 U/L 49   WBC Latest Ref Range: 4.0 - 10.0 THOU/uL 14.8 (H)   RBC Latest Ref Range: 3.9 - 5.2 MIL/uL 4.9   Hemoglobin Latest Ref Range: 11.2 - 15.7 g/dL 13.4   Hematocrit Latest Ref Range: 34 - 45 % 42   MCV Latest Ref Range: 79 - 95 fL 86   MCH Latest Ref Range: 26 - 32 pg/cell 27   MCHC Latest Ref Range: 32 - 36 g/dL 32   RDW Latest Ref Range: 11.7 - 14.4 % 13.1   Platelets Latest Ref Range: 160 - 370 THOU/uL 430 (H)   Neut # K/uL Latest Ref Range: 1.6 - 6.1 THOU/uL 10.4 (H)   Lymph # K/uL Latest Ref Range: 1.2 - 3.7 THOU/uL 3.3   Mono # K/uL Latest Ref Range: 0.2 - 0.9 THOU/uL 0.6   Eos #  K/uL Latest Ref Range: 0.0 - 0.4 THOU/uL 0.2   Baso # K/uL Latest Ref Range: 0.0 - 0.1 THOU/uL 0.1   IMM Granulocytes # Latest Ref Range: 0.0 - 0.0 THOU/uL 0.1 (H)   Nucl RBC # K/uL Latest Ref Range: 0.0 - 0.0 THOU/uL 0.0   Seg Neut % Latest Units: % 70.4   Lymphocyte % Latest Units: % 22.6   Monocyte % Latest Units: % 4.1   Eosinophil % Latest Units: % 1.4   Basophil % Latest Units: % 0.7   IMM Granulocytes Latest Units: % 0.8   Nucl RBC % Latest Ref Range: 0.0 - 0.2 /100 WBC 0.0       I personally reviewed the patient's medications, allergies, medical history and updated as appropriate.      Current Outpatient Medications   Medication Sig    traMADol (ULTRAM) 50 MG tablet TAKE 1 TABLET BY MOUTH EVERY 8 HOURS AS NEEDED FOR HEAD PAIN AND ABDOMINAL PAIN. MAXIMUM DAILY DOSE 3 TABLETS PER DAY    fluticasone (FLOVENT HFA) 220 MCG/ACT inhaler Inhale 2 puffs into the lungs 2 times daily Shake well before each use.    lamoTRIgine (LAMICTAL) 150 MG tablet TAKE 1 TABLET BY MOUTH EVERY DAY    topiramate (TOPAMAX) 100 MG tablet Take 1 tablet (100 mg total) by mouth 2 times daily    insulin lispro 100 UNIT/ML injection vial Inject as directed up to MDD  100 units    albuterol HFA (VENTOLIN HFA) 108 (90 Base) MCG/ACT inhaler Shake well before each use.INHALE 1 TO 2 PUFFS BY MOUTH INTO THE LUNGS EVERY 6 HOURS AS NEEDED FOR WHEEZING. SHAKE WELL BEFORE EACH USE.    clonazePAM (KLONOPIN) 0.5 MG tablet Take 1 tablet (0.5 mg total) by mouth daily as needed Max daily dose: 0.5 mg    insulin syringe-needle U-100 (BD INSULIN SYRINGE U/F) 31G X 5/16" 0.3 ML HALF-UNIT USE THREE TIMES DAILY AS INSTRUCTED    diphenoxylate-atropine (LOMOTIL) 2.5-0.025 MG per tablet Take 1-2 tablets by mouth 4 times daily as needed for Diarrhea Max daily dose: 8 tablets Code D    QUEtiapine (SEROQUEL XR) 50 MG 24 hr tablet Take 1 tablet (50 mg total) by mouth nightly Swallow whole. Do not chew, crush, or break.    cetirizine (ZYRTEC) 10 MG tablet Take 1 tablet (10 mg total) by mouth daily    spironolactone (ALDACTONE) 25 MG tablet Take 1 tablet (25 mg total) by mouth daily    dulaglutide (TRULICITY) 2.95 JO/8.4ZY injection pen Inject 0.5 mLs (0.75 mg total) into the skin every 7 days    clindamycin (CLEOCIN T) 1 % external solution Apply topically 2 times daily Continue until improvement in cysts    blood pressure monitor, automatic with arm cuff Use as directed to monitor blood pressure    Alcohol Swabs (B-D SINGLE USE SWABS REGULAR) PADS USE 1 PAD TOPICALLY TWO TIMES DAILY TO CHECK BLOOD GLUCOSE    incontinence supply disposable Order Description:  Wear as needed for stool leakage    pantoprazole (PROTONIX) 40 MG EC tablet TAKE 1 TABLET BY MOUTH EVERY DAY, SWALLOW WHOLE. DO NOT BREAK, CHEW OR CRUSH    Insulin Disposable Pump (OMNIPOD DASH 5 PACK) MISC By 1 Device no specified route as needed Change every 2 days = 45 pods for 90 days supply = 9 (5packs)    zinc oxide 20 % ointment Apply topically as needed for Dry Skin    clotrimazole (LOTRIMIN) 1 % cream Apply  topically 2 times daily    Continuous Blood Gluc Sensor (DEXCOM G6 SENSOR) MISC By 1 Device no specified route as needed     Continuous Blood Gluc Transmit (DEXCOM G6 TRANSMITTER) MISC By 1 Device no specified route as needed    Continuous Blood Gluc Receiver (DEXCOM G6 RECEIVER) DEVI By 1 Device no specified route as needed    DULoxetine (CYMBALTA) 30 MG DR capsule TAKE 1 CAPSULE BY MOUTH TWO TIMES DAILY    ascorbic acid (VITAMIN C) 100 MG tablet Take 5 tablets (500 mg total) by mouth daily    busPIRone (BUSPAR) 30 MG tablet Take 1 tablet (30 mg total) by mouth 2 times daily    famciclovir (FAMVIR) 500 MG tablet Take 1 tablet (500 mg total) by mouth 3 times daily as needed    glucagon (BAQSIMI) 3 MG/DOSE nasal powder Inhale one dose (3 mg) into one nostril once as needed for low blood sugar. If no response in 15 min, inhale second dose from new device    promethazine (PHENERGAN) 25 MG tablet Take 1 tablet (25 mg total) by mouth every 4-6 hours as needed    fluticasone (FLONASE) 50 MCG/ACT nasal spray SPRAY 1 SPRAY INTO EACH NOSTRIL ONE TIME DAILY    Continuous Blood Gluc Receiver (DEXCOM G6 RECEIVER) DEVI By 1 Device no specified route as needed    blood glucose test strip Test  8 times a day.   Brand name of strips Contour NEXT test strips for linked omnipod pump    lancets (BAYER MICROLET) Use   8  times per day as instructed for blood glucose testing.    azelastine (OPTIVAR) 0.05 % ophthalmic solution Place 1 drop into both eyes 2 times daily    SUMAtriptan refill (IMITREX STATDOSE) 6 MG/0.5ML injection INJECT 0.5ML UNDER THE SKIN ONCE AS NEEDED FOR MIGRAINE. MAY REPEAT DOSE ONCE AFTER 1 HOUR    glucagon (GLUCAGEN) 1 MG injection kit Inject 1 mg into the skin once as needed for Low blood sugar   Discard any unused portion.    SUMAtriptan (IMITREX) 50 MG tablet TAKE 1 TABLET BY MOUTH AS NEEDED FOR MIGRAINE, TAKE AT ONSET OF HEADACHE. MAY REPEAT ONCE IN 2 HOURS    SUMAVEL DOSEPRO 6 MG/0.5ML needle-free injection INJECT 0.5MLS (6MG TOTAL) UNDER THE SKIN AS NEEDED FOR MIGRAINE MAY REPEAT ONCE AFTER 1 HOUR IF NEEDED     atorvastatin (LIPITOR) 40 MG tablet Take 1 tablet (40 mg total) by mouth daily (with dinner)    metoprolol (TOPROL-XL) 25 MG 24 hr tablet Take by mouth daily       midodrine (PROAMATINE) 10 MG tablet Take 1 tablet (10 mg total) by mouth 3 times daily    Non-System Medication The above patient is followed in our clinic and cannot resume work permanently.    doxycycline hyclate (VIBRAMYCIN) 100 MG capsule Take 1 capsule (100 mg total) by mouth daily       ROS  See pertinent HPI      OBJECTIVE  Vitals:    04/14/19 1639   BP: 120/90   Pulse: 70   Temp: 36.7 C (98 F)   Weight: 117 kg (258 lb)   Height: 1.727 m (_0 )     Body mass index is 39.23 kg/m.      GENERAL: A&Ox3, pleasant, cooperative, well groomed, appropriately dressed, actively engaged in encounter, sitting hunched over grabbing her abdomen, tears noted.  LUNGS: Normal resp effort. CTAB.  No adventitious breath sounds noted.  Full and symmetric lung expansion.  CARDIAC: RRR, S1 S2, no murmers, gallops, rubs.  No pedal edema. Pulses 2+ b/l.     ABDOMEN: Abdomen non-distended,soft, no palpable masses, BS active x 4, + generalized tenderness, noted more in the RUQ/RLQ, no rebound, or guarding, no CVAT, patient visibly uncomfortable during exam, jumped on exam table during palpation of abdomen.  PSYCH: AAOx3, normal affect and mood. Insight and judgement intact.     ASSESSMENT & PLAN    DDx: IBS/anxiety, diverticulitis, colitis, GERD, cholecystitis, pancreatitis, ovarian cyst, other intra-abdominal process.    1. Abdominal pain, unspecified abdominal location  Patient was sent to the ED given visible amount of discomfort during the visit and on physical exam.  Will follow up when needed.    04/15/2019-Addendum: Patient went to the ED, had normal lab work and [place], discharged home.  Patient still in intense amount of discomfort.  Will proceed with abdominal/pelvic CT scan.      Follow-up: No follow-ups on file.      Patient verbalized understanding of  information presented,and confirmed agreement with current plan of care.  Reviewed risks, benefits, and administration of prescribed/refilled medications. Precautions reviewed.      Graceann Congress, FNP-C  04/15/2019  12:52 PM

## 2019-04-14 NOTE — ED Notes (Signed)
ED RN INTERN ATTESTATION       I Phillip Heal, RN (RN) reviewed the following charting information by the RN intern: Kaylee Harris    Nursing Assessments  Medications  Plan of Care  Teaching   Notes    In the chart of Kaylee Harris (43 y.o. female) and attest to the charting being accurate.

## 2019-04-15 ENCOUNTER — Encounter: Payer: Self-pay | Admitting: Primary Care

## 2019-04-15 DIAGNOSIS — R1084 Generalized abdominal pain: Secondary | ICD-10-CM

## 2019-04-15 LAB — OCCULT BLOOD X 1, STOOL: Occult Blood 1: NEGATIVE

## 2019-04-15 MED ORDER — DOXYCYCLINE HYCLATE 100 MG PO CAPS *I*
100.0000 mg | ORAL_CAPSULE | Freq: Every day | ORAL | 9 refills | Status: DC
Start: 2019-04-15 — End: 2019-08-07

## 2019-04-16 ENCOUNTER — Ambulatory Visit
Admission: RE | Admit: 2019-04-16 | Discharge: 2019-04-16 | Disposition: A | Discharge status: Home / Self Care | Payer: Medicare (Managed Care) | Source: Ambulatory Visit | Attending: Primary Care | Admitting: Primary Care

## 2019-04-16 ENCOUNTER — Ambulatory Visit: Payer: Medicare (Managed Care) | Admitting: Primary Care

## 2019-04-16 ENCOUNTER — Encounter: Payer: Self-pay | Admitting: Primary Care

## 2019-04-16 DIAGNOSIS — R1084 Generalized abdominal pain: Secondary | ICD-10-CM

## 2019-04-16 MED ORDER — STERILE WATER FOR IRRIGATION IR SOLN *I*
900.0000 mL | Freq: Once | Status: AC
Start: 2019-04-16 — End: 2019-04-16
  Administered 2019-04-16: 900 mL via ORAL

## 2019-04-16 MED ORDER — IOHEXOL 350 MG/ML (OMNIPAQUE) IV SOLN *I*
1.0000 mL | Freq: Once | INTRAVENOUS | Status: AC
Start: 2019-04-16 — End: 2019-04-16
  Administered 2019-04-16: 148 mL via INTRAVENOUS

## 2019-04-19 ENCOUNTER — Telehealth: Payer: Self-pay

## 2019-04-19 ENCOUNTER — Other Ambulatory Visit: Payer: Self-pay | Admitting: Obstetrics and Gynecology

## 2019-04-19 MED ORDER — DICYCLOMINE HCL 10 MG PO CAPS *I*
10.0000 mg | ORAL_CAPSULE | Freq: Four times a day (QID) | ORAL | 0 refills | Status: DC
Start: 2019-04-19 — End: 2019-04-20
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
## 2019-04-19 NOTE — Telephone Encounter (Signed)
TC from Mignon, the pharmacist at [facility]. He has questions about the following med:       doxycycline hyclate (VIBRAMYCIN) 100 MG capsule 30 capsule 9 04/15/2019     Sig - Route: Take 1 capsule (100 mg total) by mouth daily - Oral      The pt has been on Doxy monohydrate 100 mg in the past, he is wondering if that was supposed to be reordered.   Will forward to the providers for clarification.

## 2019-04-19 NOTE — Telephone Encounter (Signed)
Clinically I am unsure of difference in absorption or pharmakinetics. I think that both should be equal unless pharmacist feel different. Message routed to nursing for communication.    Elliot Gault, MD  OB/GYN Attending  Signed 04/19/2019 at 12:07 PM

## 2019-04-20 MED ORDER — DICYCLOMINE HCL 10 MG PO CAPS *I*
10.0000 mg | ORAL_CAPSULE | Freq: Four times a day (QID) | ORAL | 0 refills | Status: DC
Start: 2019-04-20 — End: 2019-04-26

## 2019-04-20 NOTE — Addendum Note (Signed)
Addended by: Julienne Kass on: 04/20/2019 08:47 AM     Modules accepted: Orders

## 2019-04-22 ENCOUNTER — Ambulatory Visit: Payer: Medicare (Managed Care)

## 2019-04-22 ENCOUNTER — Other Ambulatory Visit: Payer: Self-pay

## 2019-04-22 DIAGNOSIS — F603 Borderline personality disorder: Secondary | ICD-10-CM

## 2019-04-22 DIAGNOSIS — F331 Major depressive disorder, recurrent, moderate: Secondary | ICD-10-CM

## 2019-04-22 NOTE — Telephone Encounter (Signed)
This was only intended for 2 weeks not to be refilled at this time.

## 2019-04-22 NOTE — Progress Notes (Signed)
Behavioral Health Progress Note   Patient encounter was performed via Video     Patient located at: home    Provider located at: home office    Other participants in this encounter and roles:  N/A    Consent was previously obtained from the patient to complete this service via telehealth; including the potential for financial liability.  Length of Session: 45 minutes    Contact Type:  Location: Telemedicine Covid-19    Other: Video     Problem(s)/Goals Addressed from Treatment Plan:    Problem 1:   Treatment Problem #1 04/08/2019   Patient Identified Problem OCD       Goal for this problem:    Treatment Goal #1 04/08/2019   Patient Identified Goal Utilize CBT to reduce behaviors       Progress towards this goal: Patient reports interpersonal issues.    Mental Status Exam:  APPEARANCE: Appears stated age, Casual  ATTITUDE TOWARD INTERVIEWER: Cooperative  MOTOR ACTIVITY: WNL (within normal limits)  EYE CONTACT: Direct  SPEECH: Normal rate and tone  AFFECT: Full Range  MOOD: Anxious and Depressed  THOUGHT PROCESS: Normal and Circumstantial  THOUGHT CONTENT: Negative Rumination  PERCEPTION: No evidence of hallucinations  CURRENT SUICIDAL IDEATION: patient denies  CURRENT HOMICIDAL IDEATION: Patient denies  ORIENTATION: Alert and Oriented X 3.  CONCENTRATION: Good  MEMORY:   Recent: intact   Remote: intact  COGNITIVE FUNCTION: Average intelligence  JUDGMENT: Intact  IMPULSE CONTROL: Fair  INSIGHT: Fair    Risk Assessment:  ASSESSMENT OF RISK FOR SUICIDAL BEHAVIOR  Changes in risk for suicide from baseline Formulation of Risk and/or previous intake, including newly identified risk, if any: none  Violence risk was assessed and No Change noted from baseline formulation of risk and/or previous assessment.    Session Content::  Patient reports interpersonal issues. Patient talked about her niece expressing transgender. She said that she wants to help them - brother wants her to talk some sense into them, which goes against  her values. Patient said that she feels stuck in the middle and doesn't like this. Writer validated patient. Patient said that she wants to see her father on Christmas but [name] doesn't want to go due to his being in the hospital recently. She said that her brother wants everyone to come over for Christmas and thinks that her parents will probably go but she and [name] will not. Patient said that her sister, she and [name] went to her cousin's restaurant for her birthday. She said that they were the only ones there and she told [name] it was one of the best birthdays ever.     Visit Diagnosis:      ICD-10-CM ICD-9-CM   1. Borderline personality disorder  F60.3 301.83   2. MDD (major depressive disorder), recurrent episode, moderate  F33.1 296.32       Interventions:  Supportive Psychotherapy  Taught/practiced coping skills (specify skills used):  Radical acceptance; finding meaning    Current Treatment Plan   Created/Updated On 04/08/2019   Next Treatment Plan Due 07/06/2019     Consent was previously obtained from the patient to complete this Video consult; including the potential for financial liability.    Time spent on the  Video with patient: 40+ minutes    Length of time compiling the report:15 minutes    Plan:  Psychotherapy continues as described in care plan; plan remains the same.    NEXT APPT: 05/10/19.      Tresa Res,  LCSW-R

## 2019-04-22 NOTE — Telephone Encounter (Signed)
TC to the pharmacy. The pharmacist says that both medications are equal in effectiveness, they will send the new prescription to the pt.

## 2019-04-22 NOTE — Telephone Encounter (Signed)
Last appt: 04/14/2019     Next appt:  05/05/2019        Recent Lab Values 04/14/2019 01/04/2018 12/17/2017 10/17/2017 10/17/2017 03/31/2017 10/29/2016   EXP DATE 2019-08-04 9/20 02/02/2018 11-03-18 08-04-18 04/04/18 04/04/2017   [facility] - - - - - - -

## 2019-04-23 NOTE — Progress Notes (Signed)
Pre-Visit Planning    Health Maintenance Due   Topic Date Due    HIV Screening USPSTF/[facility]  04/11/1987    Hepatitis C Screening  04/10/1992    Diabetic Foot Exam ADA  02/07/2019    Diabetic A1C Monitoring ADA  05/28/2019       Lab Results   Component Value Date    HA1C 6.7 (H) 11/25/2018     Lab Results   Component Value Date    MALBR <1.20 11/25/2018       Notes:  - A1c - DUE SOON     - Foot exam - DUE       Completed on 04/23/19 by Tanna Savoy

## 2019-04-26 ENCOUNTER — Encounter: Payer: Self-pay | Admitting: Primary Care

## 2019-04-26 MED ORDER — DICYCLOMINE HCL 10 MG PO CAPS *I*
10.0000 mg | ORAL_CAPSULE | Freq: Four times a day (QID) | ORAL | 0 refills | Status: DC
Start: 2019-04-26 — End: 2019-06-08

## 2019-05-04 ENCOUNTER — Encounter: Payer: Self-pay | Admitting: Psychiatry

## 2019-05-04 ENCOUNTER — Ambulatory Visit: Payer: Medicare (Managed Care) | Admitting: Psychiatry

## 2019-05-04 DIAGNOSIS — E1165 Type 2 diabetes mellitus with hyperglycemia: Secondary | ICD-10-CM

## 2019-05-04 NOTE — Progress Notes (Signed)
Endocrinology, Diabetes, and Metabolism [facility] for Diabetes.    Video Visit     Location of Patient: home    Location of Telemedicine Provider: home office    Other participants in telemedicine encounter and roles:  [name]    This is an established patient visit.    Reason for visit: No chief complaint on file.      HPI:    This is a 43 y.o. female with a history of Type 2 Diabetes for almost 10 years.  Known complications include none.    Since her last visit, she has been back on Trulicity and doing well. She has however recently had stomach pain ad went to ED, she had an ultrasound that looked unremarkable and her doctor has given her dicyclomine that seems helpful. She also had had worsening of her asthma but does not believe it is COVID. She is social distancing although at Christmas she had her brother over who did not wear a mask so she is a bit concerned about that. Her mental health has been more challenged since COVID and the deterioration of her father's health.    Denies any CP or SOB, no polyuria, no polydipsia. Occasional double vision when she feels a migraine is coming on, she takes topamax for her Migraines which seem helpful. No blurry vision. No severe hypoglycemia requiring assistance or EMS. No paresthesias in hands or feet. No open wounds or sores on feet and performs daily self checks.Has ongoing diabetes health maintenance activities done including routine eye exams, podiatry and dental.    Current diabetes regimen:   trulicity 6.22WL weekly     humalog in pump  OMNIPOD  Basal Rates:  MN 0.9 units/hr  3AM 1.45 units/hr  6AM 1.2 units/hr  Noon  1.0 units/hr  4PM 1.2 units/hr  10pm 1.1 units/hr  ICR 8   8pm 7  ISF 40  Target 140  Active Insulin 4h  54% basal /46% bolus  Glucose Sensor - G6 Dexcom  Not eating much during the days, POTS makes her feel tired and bloated.    She lacks motivation right now and feels fatigued often    Blood sugar logs and/or meter were Reviewed on Glooko/Dexcom  Past 30  days  Lowest BG in the 60s    Fasting 90s-100s  Lunch 100s-130s  Dinner  80s-190s  Bedtime 140s-200s  OVN 66-200s            Checking 4-6x daily.     hypoglycemia occurs 0 times a week.  It is most often at never.  There has been the need for medical assistance for these episodes none.      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 Lower abdomen with cellulitis changes, wound probed and purulent material expressed.  Had PICC line for "multiple infiltrations" of what? D/c-ed home on 10d of Augmentin 9/11.      Anginal pain     Anxiety     Arthritis     Asthma     Depression     bipolar    Diabetes mellitus     Previously on SU and metformin, now  diet controlled    Diverticulitis 09/2010    Dysfunctional uterine bleeding     Fibromyalgia     GERD (gastroesophageal reflux disease)     GERD (gastroesophageal reflux disease) 10/04/2016    High blood pressure     Hyperlipidemia     Liver disease     fatty liver     Long term (current) use of insulin, Dermal Adhesed V-Go Insulin Delivery Device 10/04/2016    Migraine     Neuromuscular disorder     POTS (postural orthostatic tachycardia syndrome)     Sebaceous cyst of breast     right axilla    TMJ click, left 05/10/4006    Varicella      Past Surgical History:   Procedure Laterality Date    APPENDECTOMY  2016    arthroscopic shoulder surgery Right 2010    Related to lifting    CARDIAC CATHETERIZATION  09/2011    negative    COLON SURGERY      COSMETIC SURGERY Bilateral 12/12/2017    B/L internal maxillary arteries embolized due to recurrent epistaxis, Dr Linard Millers    DILATION AND CURETTAGE OF UTERUS  2005, 2007    x2 for menorraghia    LEFT COLECTOMY  01/03/14     Dr Tresa Res    loop recorder  Feb 03 2014    loop recorder removal      PR COLONOSCOPY THRU COLOTOMY N/A 02/16/2016    Procedure: COLONOSCOPY;  Surgeon: Kelly Splinter, MD;  Location: [facility];  Service: GI    PR CYSTOURETHROSCOPY,BIOPSY N/A 10/17/2016    Procedure: CYSTOSCOPY BLADDER ;  Surgeon: Ed Blalock, MD;  Location[facility];  Service: Urology    PR EDG TRANSORAL BIOPSY SINGLE/MULTIPLE N/A 10/29/2016    Procedure: EGD;  Surgeon: Kelly Splinter, MD;  Location: [facility];  Service: GI    TONSILLECTOMY       Family History   Problem Relation Age of Onset    Hypertension Father     Diabetes Father     Kidney Disease Father     Hyperlipidemia Father     Heart attack Father 29    Other Father         PVD    Heart Disease Father     Hypertension Mother     Hyperlipidemia Mother     Heart attack Mother 11    Diabetes Mother     Eczema Mother     Psoriasis Mother     Heart Disease Mother     Hypertension Brother     Heart Disease Brother 40        prinzmetal's angina    Heart Disease Sister         currently having work up    [place] Sister     Hypertension Sister     Breast cancer Maternal Grandmother     Stroke Other     Thyroid disease Sister     Thyroid cancer Sister     Anesthesia problems Neg Hx      Social History     Socioeconomic History    Marital status: Married     Spouse name: Not on file    Number of children: Not on file    Years of education: Not on file    Highest education level: Not on file   Tobacco Use    Smoking status: Former Smoker     Packs/day: 0.50  Years: 3.00     Pack years: 1.50     Types: Cigarettes    Smokeless tobacco: Never Used    Tobacco comment: quit age late 85s   Substance and Sexual Activity    Alcohol use: Yes     Alcohol/week: 0.0 standard drinks     Comment: 0-1 drink/ month    Drug use: No    Sexual activity: Yes     Partners: Female     Birth control/protection: I.U.D.   Other Topics  Concern    Special Diet Not Asked    Exercise Not Asked    Weight Concern Not Asked    Caffeine Concern Not Asked   Social History Narrative    ** Merged History Encounter **         Married to [employer], recently relocated back to [place] from [place] due to family stressors. 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        Allergies:   Allergies   Allergen Reactions    Morphine Itching    Trazodone Anaphylaxis    Seasonal Allergies Itching    Lidocaine Rash     Patches caused a rash    Nsaids Other (See Comments)     Bleeding/epistaxis    Other [Other] Other (See Comments)     Derma Bond(skin glue) pruritis/redness and c/o respiratory distress.   Received name: Other       Current Outpatient Medications on File Prior to Visit   Medication Sig Dispense Refill    dicyclomine (BENTYL) 10 MG capsule Take 1 capsule (10 mg total) by mouth 4 times daily (before meals and nightly) 129 capsule 0    clindamycin (CLEOCIN T) 1 % external solution APPLY TOPICALLY TWO TIMES DAILY CONTINUE UNTIL IMPROVEMENT IN CYSTS 60 mL 1    doxycycline hyclate (VIBRAMYCIN) 100 MG capsule Take 1 capsule (100 mg total) by mouth daily 30 capsule 9    traMADol (ULTRAM) 50 MG tablet TAKE 1 TABLET BY MOUTH EVERY 8 HOURS AS NEEDED FOR HEAD PAIN AND ABDOMINAL PAIN. MAXIMUM DAILY DOSE 3 TABLETS PER DAY 42 tablet 0    fluticasone (FLOVENT HFA) 220 MCG/ACT inhaler Inhale 2 puffs into the lungs 2 times daily Shake well before each use. 1 each 0    lamoTRIgine (LAMICTAL) 150 MG tablet TAKE 1 TABLET BY MOUTH EVERY DAY 90 tablet 2    topiramate (TOPAMAX) 100 MG tablet Take 1 tablet (100 mg total) by mouth 2 times daily 180 tablet 3    insulin lispro 100 UNIT/ML injection vial Inject as directed up to MDD 100 units 40 mL 11    albuterol HFA (VENTOLIN HFA) 108 (90 Base) MCG/ACT inhaler Shake well before each use.INHALE 1 TO 2 PUFFS BY MOUTH INTO THE LUNGS EVERY 6 HOURS AS NEEDED FOR WHEEZING. SHAKE  WELL BEFORE EACH USE. 1 each 1    clonazePAM (KLONOPIN) 0.5 MG tablet Take 1 tablet (0.5 mg total) by mouth daily as needed Max daily dose: 0.5 mg 28 tablet 0    insulin syringe-needle U-100 (BD INSULIN SYRINGE U/F) 31G X 5/16" 0.3 ML HALF-UNIT USE THREE TIMES DAILY AS INSTRUCTED 100 each 10    diphenoxylate-atropine (LOMOTIL) 2.5-0.025 MG per tablet Take 1-2 tablets by mouth 4 times daily as needed for Diarrhea Max daily dose: 8 tablets Code D 240 tablet 0    QUEtiapine (SEROQUEL XR) 50 MG 24 hr tablet Take 1 tablet (50 mg total) by mouth  nightly Swallow whole. Do not chew, crush, or break. 90 tablet 3    cetirizine (ZYRTEC) 10 MG tablet Take 1 tablet (10 mg total) by mouth daily 90 tablet 1    spironolactone (ALDACTONE) 25 MG tablet Take 1 tablet (25 mg total) by mouth daily 30 tablet 3    dulaglutide (TRULICITY) 6.96 VE/9.3YB injection pen Inject 0.5 mLs (0.75 mg total) into the skin every 7 days 2 mL 5    blood pressure monitor, automatic with arm cuff Use as directed to monitor blood pressure 1 each 0    Alcohol Swabs (B-D SINGLE USE SWABS REGULAR) PADS USE 1 PAD TOPICALLY TWO TIMES DAILY TO CHECK BLOOD GLUCOSE 100 each 1    incontinence supply disposable Order Description:  Wear as needed for stool leakage 100 each 3    pantoprazole (PROTONIX) 40 MG EC tablet TAKE 1 TABLET BY MOUTH EVERY DAY, SWALLOW WHOLE. DO NOT BREAK, CHEW OR CRUSH 90 tablet 3    Insulin Disposable Pump (OMNIPOD DASH 5 PACK) MISC By 1 Device no specified route as needed Change every 2 days = 45 pods for 90 days supply = 9 (5packs) 45 each 3    zinc oxide 20 % ointment Apply topically as needed for Dry Skin 30 g 3    clotrimazole (LOTRIMIN) 1 % cream Apply topically 2 times daily 15 g 3    Continuous Blood Gluc Sensor (DEXCOM G6 SENSOR) MISC By 1 Device no specified route as needed 3 each 11    Continuous Blood Gluc Transmit (DEXCOM G6 TRANSMITTER) MISC By 1 Device no specified route as needed 1 each 3    Continuous Blood  Gluc Receiver (DEXCOM G6 RECEIVER) DEVI By 1 Device no specified route as needed 1 Device 0    DULoxetine (CYMBALTA) 30 MG DR capsule TAKE 1 CAPSULE BY MOUTH TWO TIMES DAILY 180 capsule 3    ascorbic acid (VITAMIN C) 100 MG tablet Take 5 tablets (500 mg total) by mouth daily 100 tablet 3    busPIRone (BUSPAR) 30 MG tablet Take 1 tablet (30 mg total) by mouth 2 times daily 180 tablet 3    famciclovir (FAMVIR) 500 MG tablet Take 1 tablet (500 mg total) by mouth 3 times daily as needed 30 tablet 5    glucagon (BAQSIMI) 3 MG/DOSE nasal powder Inhale one dose (3 mg) into one nostril once as needed for low blood sugar. If no response in 15 min, inhale second dose from new device 2 each 5    promethazine (PHENERGAN) 25 MG tablet Take 1 tablet (25 mg total) by mouth every 4-6 hours as needed 30 tablet 3    fluticasone (FLONASE) 50 MCG/ACT nasal spray SPRAY 1 SPRAY INTO EACH NOSTRIL ONE TIME DAILY 48 g 3    Continuous Blood Gluc Receiver (DEXCOM G6 RECEIVER) DEVI By 1 Device no specified route as needed 1 Device 0    blood glucose test strip Test  8 times a day.   Brand name of strips Contour NEXT test strips for linked omnipod pump 250 strip 11    lancets (BAYER MICROLET) Use   8  times per day as instructed for blood glucose testing. 250 each 11    azelastine (OPTIVAR) 0.05 % ophthalmic solution Place 1 drop into both eyes 2 times daily      SUMAtriptan refill (IMITREX STATDOSE) 6 MG/0.5ML injection INJECT 0.5ML UNDER THE SKIN ONCE AS NEEDED FOR MIGRAINE. MAY REPEAT DOSE ONCE AFTER 1 HOUR 2 Syringe 5  glucagon (GLUCAGEN) 1 MG injection kit Inject 1 mg into the skin once as needed for Low blood sugar   Discard any unused portion. 1 each 5    SUMAtriptan (IMITREX) 50 MG tablet TAKE 1 TABLET BY MOUTH AS NEEDED FOR MIGRAINE, TAKE AT ONSET OF HEADACHE. MAY REPEAT ONCE IN 2 HOURS 9 tablet 5    SUMAVEL DOSEPRO 6 MG/0.5ML needle-free injection INJECT 0.5MLS (6MG TOTAL) UNDER THE SKIN AS NEEDED FOR MIGRAINE MAY  REPEAT ONCE AFTER 1 HOUR IF NEEDED  1    atorvastatin (LIPITOR) 40 MG tablet Take 1 tablet (40 mg total) by mouth daily (with dinner) 90 tablet 3    metoprolol (TOPROL-XL) 25 MG 24 hr tablet Take by mouth daily         midodrine (PROAMATINE) 10 MG tablet Take 1 tablet (10 mg total) by mouth 3 times daily 90 tablet 0    Non-System Medication The above patient is followed in our clinic and cannot resume work permanently. 1 each 1     No current facility-administered medications on file prior to visit.      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 no  -Dawn phenomenon:  yes  -HbA1c greater than 7% or 1% over upper range of normal: yes  -History of suboptimal glycemic control before or during pregnancy: na  -LAST 30 DAYS of BG LOGS (in notes below) or ranges from: Glooko/G6 report 60s-200s    Review of Systems:  CONSTITUTIONAL:  Appetite good, no fevers, night sweats, weight loss  HEAD: No headache, dizziness or syncope  EYES:No vision changes or eye pain, intermittent blurry vision with migraine  ENT: No hearing difficulties or ear pain  CV: No chest pain, shortness of breath or edema.  RESPIRATORY:  No SOB, cough or wheezing  GI:  No nausea, vomiting, abdominal pain improved since dicyclomine  GU:  No dysuria, urgency or incontinence  NEURO:  No mental status changes, motor weakness or sensory changes  PSYCH:  + depression and anxiety, worsened since COVID and father being ill  MS:  No joint pain, swelling or  musculoskeletal deformities  SKIN:  No rashes  HEME/LYMPH:  No easy bleeding, bruising or swollen nodes  ENDOCRINE:  No polyuria, polydipsia, polyphagia or heat/cold intolerance    Patient's problem list, allergies, and medications were reviewed and updated as appropriate.  Please see the EHR for full details.    Exam and data reviewed:Review of CGM:  AGP REPORT      Continuous Glucose Monitoring Report: Sensor Type  - 670G  Indication:   - Hypoglycemia unawareness  Date of CGM hook up:  05/04/2019  Average glucose:129  GMI 6.4%  Hypoglycemia events and excursions:  -minimal lowest 66  Hyperglycemic events and excursions:  -minimal, very rare   Changes to diabetes regimen after CGM:  NONE    Physical Examination:   There were no vitals  filed for this visit.  There is no height or weight on file to calculate BMI.  Wt Readings from Last 3 Encounters:   04/16/19 117 kg (258 lb)   04/16/19 117 kg (258 lb)   04/14/19 117 kg (258 lb)       CONSTITUTIONAL:  well developed, well nourished, no acute distress  HEENT:  no lid lank, no proptosis.   NECK on inspection, no thyromegaly,   HEART/VASCULAR:no LE edema  EXTREMITIES:  joints without deformity or synovitis. Foot exam deferred.  NEUROLOGICAL: alert and oriented x 3  SKIN:  No rashes.   ENDOCRINE: No supraclavicular fat pads, facial plethora, moon facies,   PSYCH:mood appropriate    Labs and Imaging:    I personally reviewed and confirmed all laboratory and radiology testing listed below:      Lab results: 04/14/19  1903   Sodium 139   Potassium 4.2   Chloride 105   CO2 20   UN 8   Creatinine 0.81   GFR,Caucasian 88   GFR,Black 101   Glucose 94   Calcium 9.7         Lab Results   Component Value Date    HA1C 6.7 (H) 11/25/2018       Lab Results   Component Value Date    ALT 13 04/14/2019    AST 12 04/14/2019     Lab Results   Component Value Date    CHOL 146 11/25/2018    HDL 36 (L) 11/25/2018    LDLC 84 11/25/2018    TRIG 129 11/25/2018    CHHDC 4.1 11/25/2018     Lab  Results   Component Value Date    TSH 1.50 01/22/2018       Lab Results   Component Value Date    WBC 15.2 (H) 04/14/2019    HGB 13.6 04/14/2019    HCT 43 04/14/2019    MCV 87 04/14/2019    PLT 410 (H) 04/14/2019       Assessment: This is a 43 y.o. female with Type 2 Diabetes Uncontrolled with hyperglycemia.  Goal A1c is <7%. She is having occasional postprandial hyperglycemia toward evening and bedtime/overnight although this is a rare occasion. It is quite likely the resumption of GLP-1 is contributing to appetite suppression and limiting postprandial excursions.    Plan:   No change in regimen today  BP - on betablocker and diuretic  On statin therapy.  Recommend annual dilated eye exams and biannual dental exams for routine health maintenance.  hba1c before next appointment  Continue mental health follow up    Follow up: 3 month(s) or sooner if needed.      Nada Libman, NP    Nada Libman, NP  [facility] Department of Endocrinology  [address], [place], [place] [address]  Phone [phone]  Fax [phone]      Consent was obtained from the patient to complete this video visit; including the potential for financial liability.          Nada Libman, NP

## 2019-05-05 ENCOUNTER — Ambulatory Visit: Payer: Medicare (Managed Care) | Admitting: Primary Care

## 2019-05-05 ENCOUNTER — Ambulatory Visit
Admission: RE | Admit: 2019-05-05 | Discharge: 2019-05-05 | Disposition: A | Discharge status: Home / Self Care | Payer: Medicare (Managed Care) | Source: Ambulatory Visit

## 2019-05-05 VITALS — BP 106/80 | HR 70 | Temp 96.8°F | Ht 68.0 in | Wt 261.0 lb

## 2019-05-05 DIAGNOSIS — J45909 Unspecified asthma, uncomplicated: Secondary | ICD-10-CM

## 2019-05-05 DIAGNOSIS — R0600 Dyspnea, unspecified: Secondary | ICD-10-CM

## 2019-05-05 DIAGNOSIS — R0602 Shortness of breath: Secondary | ICD-10-CM

## 2019-05-05 DIAGNOSIS — R062 Wheezing: Secondary | ICD-10-CM

## 2019-05-05 DIAGNOSIS — K219 Gastro-esophageal reflux disease without esophagitis: Secondary | ICD-10-CM

## 2019-05-05 MED ORDER — FAMOTIDINE 20 MG PO TABS *I*
20.0000 mg | ORAL_TABLET | Freq: Every evening | ORAL | 1 refills | Status: DC | PRN
Start: 2019-05-05 — End: 2019-07-27

## 2019-05-05 MED ORDER — FLUTICASONE PROPIONATE HFA 220 MCG/ACT IN AERO *I*
1.0000 | INHALATION_SPRAY | Freq: Every day | RESPIRATORY_TRACT | 2 refills | Status: DC
Start: 2019-05-05 — End: 2019-05-10

## 2019-05-05 NOTE — Patient Instructions (Signed)
1. Start famotidine at night  2. Continue cetirizine and nasal spray  3. Flovent 1 puff daily.  4. Use ventolin only as needed.  5. Follow up in 3 months

## 2019-05-05 NOTE — Progress Notes (Signed)
[place]    Kaylee Harris is a 43 y.o. female who presents, for Asthma and Gastrophageal Reflux    Asthma-Patient reports that she had been using the flovent twice daily and notes that her dyspnea and cough had resolved.  She used the the inhaler for 2 weeks and felt great.  Patient notes that since she stopped using the inhaler her wheezing, dyspnea, cough and chest tightness has returned to the point that she has been using the ventolin twice daily for the last 3 days.  Patient continues to take the cetirizine daily and flonase daily.  Patient notes that the wheezing is mainly at night.  Denies any F/C or other HEENT s/s.    GERD-Patient notes that she is taking the pantoprazole daily.  She has been noting an increase in reflux symptoms over the last couple weeks. Patient notes that she feels like her food is going to come right back up when she is bending over, laying down. Patient reports that she still doesn't eat big meals.    I personally reviewed the patient's medications, allergies, medical history and updated as appropriate.      Current Outpatient Medications   Medication Sig    fluticasone (FLOVENT HFA) 220 MCG/ACT inhaler Inhale 1 puff into the lungs daily Shake well before each use.    dicyclomine (BENTYL) 10 MG capsule Take 1 capsule (10 mg total) by mouth 4 times daily (before meals and nightly)    clindamycin (CLEOCIN T) 1 % external solution APPLY TOPICALLY TWO TIMES DAILY CONTINUE UNTIL IMPROVEMENT IN CYSTS    doxycycline hyclate (VIBRAMYCIN) 100 MG capsule Take 1 capsule (100 mg total) by mouth daily    traMADol (ULTRAM) 50 MG tablet TAKE 1 TABLET BY MOUTH EVERY 8 HOURS AS NEEDED FOR HEAD PAIN AND ABDOMINAL PAIN. MAXIMUM DAILY DOSE 3 TABLETS PER DAY    lamoTRIgine (LAMICTAL) 150 MG tablet TAKE 1 TABLET BY MOUTH EVERY DAY    topiramate (TOPAMAX) 100 MG tablet Take 1 tablet (100 mg total) by mouth 2 times daily    insulin lispro 100 UNIT/ML injection vial  Inject as directed up to MDD 100 units    clonazePAM (KLONOPIN) 0.5 MG tablet Take 1 tablet (0.5 mg total) by mouth daily as needed Max daily dose: 0.5 mg    insulin syringe-needle U-100 (BD INSULIN SYRINGE U/F) 31G X 5/16" 0.3 ML HALF-UNIT USE THREE TIMES DAILY AS INSTRUCTED    diphenoxylate-atropine (LOMOTIL) 2.5-0.025 MG per tablet Take 1-2 tablets by mouth 4 times daily as needed for Diarrhea Max daily dose: 8 tablets Code D    QUEtiapine (SEROQUEL XR) 50 MG 24 hr tablet Take 1 tablet (50 mg total) by mouth nightly Swallow whole. Do not chew, crush, or break.    cetirizine (ZYRTEC) 10 MG tablet Take 1 tablet (10 mg total) by mouth daily    spironolactone (ALDACTONE) 25 MG tablet Take 1 tablet (25 mg total) by mouth daily    dulaglutide (TRULICITY) 4.01 UU/7.2ZD injection pen Inject 0.5 mLs (0.75 mg total) into the skin every 7 days    blood pressure monitor, automatic with arm cuff Use as directed to monitor blood pressure    Alcohol Swabs (B-D SINGLE USE SWABS REGULAR) PADS USE 1 PAD TOPICALLY TWO TIMES DAILY TO CHECK BLOOD GLUCOSE    incontinence supply disposable Order Description:  Wear as needed for stool leakage    pantoprazole (PROTONIX) 40 MG EC tablet TAKE 1 TABLET BY  MOUTH EVERY DAY, SWALLOW WHOLE. DO NOT BREAK, CHEW OR CRUSH    Insulin Disposable Pump (OMNIPOD DASH 5 PACK) MISC By 1 Device no specified route as needed Change every 2 days = 45 pods for 90 days supply = 9 (5packs)    zinc oxide 20 % ointment Apply topically as needed for Dry Skin    clotrimazole (LOTRIMIN) 1 % cream Apply topically 2 times daily    Continuous Blood Gluc Sensor (DEXCOM G6 SENSOR) MISC By 1 Device no specified route as needed    Continuous Blood Gluc Transmit (DEXCOM G6 TRANSMITTER) MISC By 1 Device no specified route as needed    Continuous Blood Gluc Receiver (DEXCOM G6 RECEIVER) DEVI By 1 Device no specified route as needed    DULoxetine (CYMBALTA) 30 MG DR capsule TAKE 1 CAPSULE BY MOUTH TWO TIMES  DAILY    ascorbic acid (VITAMIN C) 100 MG tablet Take 5 tablets (500 mg total) by mouth daily    busPIRone (BUSPAR) 30 MG tablet Take 1 tablet (30 mg total) by mouth 2 times daily    famciclovir (FAMVIR) 500 MG tablet Take 1 tablet (500 mg total) by mouth 3 times daily as needed    glucagon (BAQSIMI) 3 MG/DOSE nasal powder Inhale one dose (3 mg) into one nostril once as needed for low blood sugar. If no response in 15 min, inhale second dose from new device    promethazine (PHENERGAN) 25 MG tablet Take 1 tablet (25 mg total) by mouth every 4-6 hours as needed    fluticasone (FLONASE) 50 MCG/ACT nasal spray SPRAY 1 SPRAY INTO EACH NOSTRIL ONE TIME DAILY    Continuous Blood Gluc Receiver (DEXCOM G6 RECEIVER) DEVI By 1 Device no specified route as needed    blood glucose test strip Test  8 times a day.   Brand name of strips Contour NEXT test strips for linked omnipod pump    lancets (BAYER MICROLET) Use   8  times per day as instructed for blood glucose testing.    azelastine (OPTIVAR) 0.05 % ophthalmic solution Place 1 drop into both eyes 2 times daily    SUMAtriptan refill (IMITREX STATDOSE) 6 MG/0.5ML injection INJECT 0.5ML UNDER THE SKIN ONCE AS NEEDED FOR MIGRAINE. MAY REPEAT DOSE ONCE AFTER 1 HOUR    glucagon (GLUCAGEN) 1 MG injection kit Inject 1 mg into the skin once as needed for Low blood sugar   Discard any unused portion.    SUMAtriptan (IMITREX) 50 MG tablet TAKE 1 TABLET BY MOUTH AS NEEDED FOR MIGRAINE, TAKE AT ONSET OF HEADACHE. MAY REPEAT ONCE IN 2 HOURS    SUMAVEL DOSEPRO 6 MG/0.5ML needle-free injection INJECT 0.5MLS (6MG TOTAL) UNDER THE SKIN AS NEEDED FOR MIGRAINE MAY REPEAT ONCE AFTER 1 HOUR IF NEEDED    atorvastatin (LIPITOR) 40 MG tablet Take 1 tablet (40 mg total) by mouth daily (with dinner)    metoprolol (TOPROL-XL) 25 MG 24 hr tablet Take by mouth daily       midodrine (PROAMATINE) 10 MG tablet Take 1 tablet (10 mg total) by mouth 3 times daily    Non-System Medication The  above patient is followed in our clinic and cannot resume work permanently.    albuterol HFA (VENTOLIN HFA) 108 (90 Base) MCG/ACT inhaler Shake well before each use.INHALE 1 TO 2 PUFFS BY MOUTH INTO THE LUNGS EVERY 6 HOURS AS NEEDED FOR WHEEZING. SHAKE WELL BEFORE EACH USE.    famotidine (PEPCID) 20 MG tablet Take 1 tablet (20  mg total) by mouth nightly as needed for Heartburn       ROS  See pertinent HPI      OBJECTIVE  Vitals:    05/05/19 1343   BP: 106/80   Pulse: 70   Temp: 36 C (96.8 F)   SpO2: 100%   Weight: 118.4 kg (261 lb)   Height: 1.727 m ('5\' 8"' )     Body mass index is 39.68 kg/m.      GENERAL: A&Ox3, pleasant, cooperative, well groomed, appropriately dressed, actively engaged in encounter, NAD.  LYMPH: No cervical or supraclavicular lymphadenopathy.  LUNGS: Normal resp effort. CTAB.  No adventitious breath sounds noted.  Full and symmetric lung expansion.  CARDIAC: RRR, S1 S2, no murmers, gallops, rubs.  No pedal edema. Pulses 2+ b/l.     ABDOMEN: Abdomen non-distended,soft, no palpable masses, BS active x 4, mid epi-gastric tenderness, no rebound, or guarding, no CVAT   PSYCH: AAOx3, normal affect and mood. Insight and judgement intact.     ASSESSMENT & PLAN    1. Asthma, unspecified asthma severity, unspecified whether complicated, unspecified whether persistent  Due to the increase in the ventolin will add back the flovent at 1 puff daily.  Discussed that ventolin is not good control of her asthma and needs a daily maintenance medication at this time.  Reinforced the importance of continuing her current allergy regimen.  - fluticasone (FLOVENT HFA) 220 MCG/ACT inhaler; Inhale 1 puff into the lungs daily Shake well before each use.  Dispense: 1 each; Refill: 2    2. SOB (shortness of breath)  3. Wheezing  Will obtain CXR to determine if there is an underlying atypical PNA or some other process that may explain her persistent symptoms.  Likely exacerbation of her allergies and asthma.  - *Chest  standard frontal and lateral views; Future    4. Gastroesophageal reflux disease without esophagitis  Continue the pantoprazole and will add famotidine.  Discussed the importance of avoiding reflux triggers regardless of how your meals are structured.  - famotidine (PEPCID) 20 MG tablet; Take 1 tablet (20 mg total) by mouth nightly as needed for Heartburn  Dispense: 90 tablet; Refill: 1      Patient Instructions   1. Start famotidine at night  2. Continue cetirizine and nasal spray  3. Flovent 1 puff daily.  4. Use ventolin only as needed.  5. Follow up in 3 months        Follow-up: Return in about 3 months (around 08/03/2019), or asthma/GERD.      Patient verbalized understanding of information presented,and confirmed agreement with current plan of care.  Reviewed risks, benefits, and administration of prescribed/refilled medications. Precautions reviewed.      Graceann Congress, FNP-C  05/11/2019  7:34 AM

## 2019-05-06 ENCOUNTER — Encounter: Payer: Self-pay | Admitting: Primary Care

## 2019-05-10 ENCOUNTER — Ambulatory Visit: Payer: Medicare (Managed Care) | Attending: Psychiatry

## 2019-05-10 ENCOUNTER — Other Ambulatory Visit: Payer: Self-pay

## 2019-05-10 ENCOUNTER — Other Ambulatory Visit: Payer: Self-pay | Admitting: Primary Care

## 2019-05-10 DIAGNOSIS — J45909 Unspecified asthma, uncomplicated: Secondary | ICD-10-CM

## 2019-05-10 DIAGNOSIS — F422 Mixed obsessional thoughts and acts: Secondary | ICD-10-CM | POA: Insufficient documentation

## 2019-05-10 DIAGNOSIS — F4312 Post-traumatic stress disorder, chronic: Secondary | ICD-10-CM | POA: Insufficient documentation

## 2019-05-10 DIAGNOSIS — F603 Borderline personality disorder: Secondary | ICD-10-CM | POA: Insufficient documentation

## 2019-05-10 DIAGNOSIS — F331 Major depressive disorder, recurrent, moderate: Secondary | ICD-10-CM | POA: Insufficient documentation

## 2019-05-10 MED ORDER — ALBUTEROL SULFATE HFA 108 (90 BASE) MCG/ACT IN AERS *I*
INHALATION_SPRAY | RESPIRATORY_TRACT | 1 refills | Status: DC
Start: 2019-05-10 — End: 2019-06-24

## 2019-05-10 NOTE — Progress Notes (Signed)
Behavioral Health Progress Note   Patient encounter was performed via Video     Patient located at: home    Provider located at: home office    Other participants in this encounter and roles:  N/A    Consent was previously obtained from the patient to complete this service via telehealth; including the potential for financial liability.  Length of Session: 45 minutes    Contact Type:  Location: Telemedicine covid-19    Other: video     Problem(s)/Goals Addressed from Treatment Plan:    Problem 1:   Treatment Problem #1 04/08/2019   Patient Identified Problem OCD       Goal for this problem:    Treatment Goal #1 04/08/2019   Patient Identified Goal Utilize CBT to reduce behaviors       Progress towards this goal: Patient reports continued stressors.    Mental Status Exam:  APPEARANCE: Appears stated age, Casual  ATTITUDE TOWARD INTERVIEWER: Cooperative  MOTOR ACTIVITY: WNL (within normal limits)  EYE CONTACT: Direct  SPEECH: Normal rate and tone  AFFECT: Full Range  MOOD: Anxious  THOUGHT PROCESS: Circumstantial  THOUGHT CONTENT: No unusual themes and Negative Rumination  PERCEPTION: No evidence of hallucinations  CURRENT SUICIDAL IDEATION: patient denies  CURRENT HOMICIDAL IDEATION: Patient denies  ORIENTATION: Alert and Oriented X 3.  CONCENTRATION: Good  MEMORY:   Recent: intact   Remote: intact  COGNITIVE FUNCTION: Average intelligence  JUDGMENT: Intact  IMPULSE CONTROL: Fair  INSIGHT: Fair    Risk Assessment:  ASSESSMENT OF RISK FOR SUICIDAL BEHAVIOR  Changes in risk for suicide from baseline Formulation of Risk and/or previous intake, including newly identified risk, if any: none  Violence risk was assessed and No Change noted from baseline formulation of risk and/or previous assessment.    Session Content::  Patient reports continued stressors. Patient talked about her father's ongoing health issues. She said that her father fell again last night and her mother called her at 11:30pm. Patient said that she  feels frustrated with her parents - and herself. She said that she wants to go help - [name] needed her too due to an MS attack last night and really she would not have been able to lift her father. Patient said that her parents, brother, sister don't understand that [name] and she also have health issues and can't just show up anytime. Writer validated patient's thoughts and feelings. Patient said that she feels she can't tell them these things. She said that last night when the firemen came she saw what the problem is - her parents bed is too high. Patient said that she will talk to her brother about going over there to get this lower so that he doesn't keep falling. She said that she feels like it is always up to her to figure these things out. Patient said that she has been having "horrible" nightmares and yesterday was crying in the shower as she anticipates getting a call saying her father died. Writer validated patient and Probation officer and patient discussed Mindfulness practice to keep herself present centered.     Visit Diagnosis:      ICD-10-CM ICD-9-CM   1. Borderline personality disorder  F60.3 301.83   2. MDD (major depressive disorder), recurrent episode, moderate  F33.1 296.32   3. Mixed obsessional thoughts and acts  F42.2 300.3   4. Chronic post-traumatic stress disorder (PTSD)  F43.12 309.81       Interventions:  Supportive Psychotherapy  Taught/practiced coping skills (specify  skills used):  Mindfulness, radical acceptance    Current Treatment Plan   Created/Updated On 04/08/2019   Next Treatment Plan Due 07/06/2019     Consent was previously obtained from the patient to complete this Video consult; including the potential for financial liability.    Time spent on the  Video with patient: 40+ minutes    Length of time compiling the report:15 minutes    Plan:  Psychotherapy continues as described in care plan; plan remains the same.    NEXT APPT: 05/28/19.      Tresa Res, LCSW-R

## 2019-05-10 NOTE — Telephone Encounter (Signed)
Last appt: 05/05/2019     Next appt:  08/03/2019        Recent Lab Values 04/14/2019 01/04/2018 12/17/2017 10/17/2017 10/17/2017 03/31/2017 10/29/2016   EXP DATE 2019-08-04 9/20 02/02/2018 11-03-18 08-04-18 04/04/18 04/04/2017   [facility] - - - - - - -

## 2019-05-11 ENCOUNTER — Other Ambulatory Visit: Payer: Self-pay | Admitting: Obstetrics and Gynecology

## 2019-05-11 DIAGNOSIS — L732 Hidradenitis suppurativa: Secondary | ICD-10-CM

## 2019-05-11 MED ORDER — FLUTICASONE PROPIONATE HFA 220 MCG/ACT IN AERO *I*
1.0000 | INHALATION_SPRAY | Freq: Every day | RESPIRATORY_TRACT | 2 refills | Status: DC
Start: 2019-05-11 — End: 2019-05-13

## 2019-05-12 ENCOUNTER — Other Ambulatory Visit: Payer: Self-pay | Admitting: Primary Care

## 2019-05-12 DIAGNOSIS — Z760 Encounter for issue of repeat prescription: Secondary | ICD-10-CM

## 2019-05-12 MED ORDER — TRAMADOL HCL 50 MG PO TABS *I*
ORAL_TABLET | ORAL | 0 refills | Status: DC
Start: 2019-05-12 — End: 2019-06-24

## 2019-05-12 NOTE — Telephone Encounter (Signed)
Last appt: 05/05/2019     Next appt:  08/03/2019        Recent Lab Values 04/14/2019 01/04/2018 12/17/2017 10/17/2017 10/17/2017 03/31/2017 10/29/2016   EXP DATE 2019-08-04 9/20 02/02/2018 11-03-18 08-04-18 04/04/18 04/04/2017   [facility] - - - - - - -       Confidential Drug Report  Search Terms: [name], 01-03-1976   Search Date: 05/12/2019 14:51:51 PM   Searching on behalf of: [id number] - [name]   The Drug Utilization Report below displays all of the controlled substance prescriptions, if any, that your patient has filled in the last twelve months. The information displayed on this report is compiled from pharmacy submissions to the Department, and accurately reflects the information as submitted by the pharmacies.  This report was requested by: Maretta Bees   Reference #: [id number]   Others' Prescriptions  Patient Name: Kaylee Harris   Birth Date: March 06, 1976   Address: [address], [place] [address]   Sex: Female   Rx Written Rx Dispensed Drug Quantity Days Supply Prescriber Name Payment Method Dispenser   04/10/2019 04/13/2019 tramadol hcl 50 mg tablet  42 14 [name], Darnell Level (MD) [place]. #19   02/26/2019 03/02/2019 clonazepam 0.5 mg tablet  28 28 [place], [place]. #19   02/25/2019 02/26/2019 diphenoxylate-atropine 2.5-0.025 mg tablet  240 30 [place], [place]. #19   02/25/2019 02/25/2019 tramadol hcl 50 mg tablet  42 14 [place], [place]. #19   01/26/2019 01/29/2019 tramadol hcl 50 mg tablet  42 14 [place], [place]. #19   12/18/2018 12/21/2018 tramadol hcl 50 mg tablet  54 14 [name], Darnell Level (MD) [place]. #19   11/12/2018 11/15/2018 diphenoxylate-atropine 2.5-0.025 mg tablet  240 30 [place], [place]. #19   10/13/2018 10/15/2018 tramadol hcl 50 mg tablet  42 14 [place], [place]. #19   08/24/2018 08/27/2018 clonazepam 0.5 mg tablet  28 28 [place], [place]. #19   08/20/2018 08/21/2018 tramadol hcl 50 mg tablet  42 14 [place], [place]. #19   07/22/2018 07/22/2018 clonazepam 0.5 mg tablet  28 28 [name], Darnell Level (MD) [place]. #02   05/27/2018 06/02/2018 tramadol hcl 50 mg tablet  42 14 [place], [place]. [employer]

## 2019-05-13 ENCOUNTER — Other Ambulatory Visit: Payer: Self-pay | Admitting: Primary Care

## 2019-05-13 DIAGNOSIS — J45909 Unspecified asthma, uncomplicated: Secondary | ICD-10-CM

## 2019-05-13 DIAGNOSIS — Z9109 Other allergy status, other than to drugs and biological substances: Secondary | ICD-10-CM

## 2019-05-13 MED ORDER — FLUTICASONE PROPIONATE HFA 220 MCG/ACT IN AERO *I*
1.0000 | INHALATION_SPRAY | Freq: Every day | RESPIRATORY_TRACT | 2 refills | Status: DC
Start: 2019-05-13 — End: 2020-02-21

## 2019-05-13 MED ORDER — CETIRIZINE HCL 10 MG PO TABS *I*
10.0000 mg | ORAL_TABLET | Freq: Every day | ORAL | 1 refills | Status: DC
Start: 2019-05-13 — End: 2019-08-20

## 2019-05-13 NOTE — Telephone Encounter (Signed)
Pharmacy is informed of direction change

## 2019-05-13 NOTE — Telephone Encounter (Signed)
Please call the pharmacy and let them know that yes, the script is supposed to be 1 puff daily.

## 2019-05-13 NOTE — Telephone Encounter (Signed)
Pharmacy sent in fax requesting to please clarify directions on medication fluticasone (FLOVENT HFA) 220 MCG/ACT inhaler it states 2 puffs on last rx and 1 puff for the new request. Please advise

## 2019-05-14 ENCOUNTER — Other Ambulatory Visit: Payer: Self-pay | Admitting: Primary Care

## 2019-05-14 NOTE — Telephone Encounter (Signed)
Refill too early. Refilled on 12/21

## 2019-05-25 ENCOUNTER — Other Ambulatory Visit: Payer: Self-pay

## 2019-05-25 DIAGNOSIS — F419 Anxiety disorder, unspecified: Secondary | ICD-10-CM

## 2019-05-25 MED ORDER — BUSPIRONE HCL 30 MG PO TABS *I*
30.0000 mg | ORAL_TABLET | Freq: Two times a day (BID) | ORAL | 3 refills | Status: DC
Start: 2019-05-25 — End: 2020-07-10

## 2019-05-25 NOTE — Telephone Encounter (Signed)
Last appt: 05/05/2019     Next appt:  08/03/2019        Recent Lab Values 04/14/2019 01/04/2018 12/17/2017 10/17/2017 10/17/2017 03/31/2017 10/29/2016   EXP DATE 2019-08-04 9/20 02/02/2018 11-03-18 08-04-18 04/04/18 04/04/2017   [facility] - - - - - - -

## 2019-05-28 ENCOUNTER — Ambulatory Visit: Payer: Medicare (Managed Care)

## 2019-05-31 ENCOUNTER — Telehealth: Payer: Self-pay

## 2019-05-31 NOTE — Telephone Encounter (Signed)
Pt's 1/29 appt with therapist Aline Brochure has been rescheduled to 2/8. Thank you.

## 2019-06-01 ENCOUNTER — Ambulatory Visit: Payer: Medicare (Managed Care) | Admitting: Dermatology

## 2019-06-01 ENCOUNTER — Other Ambulatory Visit: Payer: Self-pay

## 2019-06-01 ENCOUNTER — Telehealth: Payer: Self-pay

## 2019-06-01 MED ORDER — INSULIN LISPRO (HUMAN) 100 UNIT/ML IJ/SC SOLN *WRAPPED*
SUBCUTANEOUS | 3 refills | Status: DC
Start: 2019-06-01 — End: 2020-07-18

## 2019-06-01 NOTE — Telephone Encounter (Unsigned)
Copied from [place] [place] [phone]. Topic: Appointments - Reschedule Appointment  >> Jun 01, 2019 12:01 PM Ivette Loyal wrote:  Kaylee Harris called along with her care coordinator with [facility] to reschedule her upcoming NPV. Patient had a family member pass away and cannot attend.    No schedule available to Probation officer.    Appointment has been cancelled.    Patient is being seen for hidradenitis, has one area that is actively bleeding. Please reach out at [phone].

## 2019-06-01 NOTE — Telephone Encounter (Addendum)
Clinical Management Note   Writer called patient and talked to patient about the death of her father last week. Patient said that her mother couldn't make the decisions that needed to be made to let him go in peace. Patient talked about her difficulty with reconciling the decision to take her father off the breathing machine. Patient said that she wants to be strong for her mother and felt horrible that she broke down crying. Writer provided support.

## 2019-06-01 NOTE — Progress Notes (Signed)
The Probation officer received a phone call from [place].  Lunafreya reports that she has not been doing well, that her father passed away and that she is really having a hard time.  The Probation officer offered her condolences to W[place] and her family.  The writer found that Maridell has been in contact with her providers and is speaking to her therapist today.  Doriann reports that she has a Dermatology appointment today at 1:30pm but is not emotionally able to make the appointment.  The Probation officer and Caraline together called the Dermatologist at [phone] to reschedule the appointment.  The receptionist states that the receptionist for Dr. Georgena Spurling will call Adaora on Thursday after the wake to reschedule due to her current circumstances.  Bethena Roys thanked the Probation officer for her assistance.  The Probation officer then checked medical records and reminded Suraiya of her upcoming appointment with therapist, Tresa Res, on 06/14/19 at [facility] reports needing no other assistance at this time.

## 2019-06-01 NOTE — Telephone Encounter (Signed)
Clinical Management Note   Writer received a message from patient stating her father had died. Writer attempted to contact patient and left a message.

## 2019-06-01 NOTE — Telephone Encounter (Signed)
Patient's insurance has denied Insulin Lispro.    They will cover Humalog.

## 2019-06-04 ENCOUNTER — Ambulatory Visit: Payer: Medicare (Managed Care)

## 2019-06-07 NOTE — Telephone Encounter (Signed)
Patient was called and LM to schedule an appointment. Will try again later.

## 2019-06-08 ENCOUNTER — Other Ambulatory Visit: Payer: Self-pay | Admitting: Primary Care

## 2019-06-08 NOTE — Telephone Encounter (Signed)
Called and lm for pt. Please transfer

## 2019-06-08 NOTE — Telephone Encounter (Signed)
Last appt: 05/05/2019     Next appt:  08/03/2019        Recent Lab Values 04/14/2019 01/04/2018 12/17/2017 10/17/2017 10/17/2017 03/31/2017 10/29/2016   EXP DATE 2019-08-04 9/20 02/02/2018 11-03-18 08-04-18 04/04/18 04/04/2017   [facility] - - - - - - -

## 2019-06-09 ENCOUNTER — Other Ambulatory Visit: Payer: Self-pay

## 2019-06-09 ENCOUNTER — Other Ambulatory Visit: Payer: Self-pay | Admitting: Psychiatry

## 2019-06-09 MED ORDER — DULAGLUTIDE 0.75 MG/0.5ML SC SOAJ *I*
0.7500 mg | SUBCUTANEOUS | 5 refills | Status: DC
Start: 2019-06-09 — End: 2019-11-03

## 2019-06-09 NOTE — Telephone Encounter (Signed)
Similar question was asked on 04/19/19 (see phone note) - both medicines are equal in effectiveness.  Please fill as written.    Minerva Ends, NP

## 2019-06-09 NOTE — Telephone Encounter (Signed)
Mendel Ryder from [facility] calling with question re: medication refill. 04/15/2019 order was for doxycycline hyclate. On 02/01/2019 per office notes patient was prescribed doxycycline monohydrate. Med is for suppression of hidradenitis suppurativa. No documentation noted in chart re: doxycyline change from monohydrate to hyclate. Pharmacist questioning if monohydrate is to be ordered & wanted clarification.

## 2019-06-09 NOTE — Telephone Encounter (Signed)
Left voice message to [facility] home delivery re: to dispense doxycycline as written.

## 2019-06-10 ENCOUNTER — Telehealth: Payer: Self-pay

## 2019-06-10 ENCOUNTER — Other Ambulatory Visit: Payer: Self-pay | Admitting: Primary Care

## 2019-06-10 MED ORDER — SUMATRIPTAN SUCCINATE 50 MG PO TABS *I*
50.0000 mg | ORAL_TABLET | ORAL | 5 refills | Status: DC | PRN
Start: 2019-06-10 — End: 2019-11-15

## 2019-06-10 NOTE — Telephone Encounter (Signed)
Last appt: 05/05/2019     Next appt:  08/03/2019        Recent Lab Values 04/14/2019 01/04/2018 12/17/2017 10/17/2017 10/17/2017 03/31/2017 10/29/2016   EXP DATE 2019-08-04 9/20 02/02/2018 11-03-18 08-04-18 04/04/18 04/04/2017   [facility] - - - - - - -

## 2019-06-10 NOTE — Telephone Encounter (Signed)
06/10/2019 Phone call from patient requesting an appointment with Dr. Jeralyn Bennett. Patient states she believes she has a bartholin cyst. Per patient history of abscesses. Denies any current fever, no drainage. States it has increased in size significantly in the last day. Patient states its on left labia. Encouraged Tylenol, warm compresses. Patient to keep area clean and dry. Appointment scheduled with Dr. Jeralyn Bennett. If any fever or worsening symptoms prior to appointment patient to call. Gerarda Gunther, RN

## 2019-06-11 ENCOUNTER — Ambulatory Visit: Payer: Medicare (Managed Care) | Admitting: Obstetrics and Gynecology

## 2019-06-11 VITALS — BP 120/78 | Temp 97.9°F | Ht 67.99 in | Wt 262.8 lb

## 2019-06-11 DIAGNOSIS — N764 Abscess of vulva: Secondary | ICD-10-CM

## 2019-06-11 MED ORDER — LIDOCAINE HCL 2 % EX JELLY WRAPPED *I*
Freq: Three times a day (TID) | CUTANEOUS | 0 refills | Status: DC | PRN
Start: 2019-06-11 — End: 2019-06-28

## 2019-06-11 MED ORDER — OXYCODONE HCL 5 MG PO TABS *I*
5.0000 mg | ORAL_TABLET | Freq: Two times a day (BID) | ORAL | 0 refills | Status: AC | PRN
Start: 2019-06-11 — End: 2019-06-14

## 2019-06-11 MED ORDER — CLINDAMYCIN HCL 300 MG PO CAPS *I*
300.0000 mg | ORAL_CAPSULE | Freq: Three times a day (TID) | ORAL | 0 refills | Status: AC
Start: 2019-06-11 — End: 2019-06-21

## 2019-06-11 NOTE — Progress Notes (Signed)
[facility] Visit  Location: [facility]     Subjective     Kaylee Harris is a 44 y.o. G0P0000 who presents for acute visit with concerns for worsening labial abscess.  She started feeling swelling of her left "inner lips" about 3 days ago, yesterday it started being more painful, she called to schedule visit, now it is so tender she can barely walk or sit.  No fevers, chills, systemic symptoms.  No spontaneous drainage.  She does have hydradenitis, and has been taking doxy 100mg  daily while awaiting derm consult, but this is not her typical hydradenitis spot.  It hurts to pee and to wipe.     Of note, she has required drainage of mons and labia majora abscesses in the past.    Of note, her father just passed away in [facility] ICU 2 weeks ago, has been under increased family stress due to this.     Patient's medications, allergies, past medical, surgical, obstetrical, gynecologic, social and family histories were reviewed and updated as appropriate.    General ROS: negative   Respiratory ROS: no cough, shortness of breath, or wheezing  Cardiovascular ROS: no chest pain or dyspnea on exertion  Gastrointestinal ROS: no abdominal pain, change in bowel habits, or black or bloody stools  Urinary ROS: no dysuria, trouble voiding, or hematuria  Musculoskeletal ROS: negative    Objective     BP 120/78    Temp 36.6 C (97.9 F)    Ht 1.727 m (5' 7.99")    Wt 119.2 kg (262 lb 12.8 oz)    BMI 39.97 kg/m      General: well developed and well nourished  NCAT  RR  Unlabored breathing    Pelvic:   VULVA: normal labia majora bilaterally  Normal right labia minora  Left labia minora with firm, rubbery, exquisitely tender, erythematous 2x2cm mass with surrounding edema, no spontaneous drainage, no fluctuence    Examination chaperoned by LPN      Assessment     Kaylee Harris is an 44 y.o. G0P0000 presenting to the office for .     1. Labial abscess           Plan     Abscess of left labia  minora without enough of a fluctuant fluid collection to I&D.  Recommend topical lidocaine, warm compresses, 10d course of clindamycin.  Advised of warning signs of systemic infection, if any worsening of pain or new fevers, she will call office.  Plan follow up exam in 1 week to re-assess.  Given degree of discomfort today, given prescription for #6 5mg  oxy IR for PRN use    Dispo:  She will follow-up in 1 week.    I personally spent 5 minutes on the calendar day of Natisha's encounter on pre-visit work, 20 minutes face to face time with Theodocia at the visit, including counseling and coordination of care, and 5 minutes on the calendar day of Shaquisha's encounter on post-visit work.      Anderson Malta, MD 06/11/2019  8:30 AM EST

## 2019-06-12 ENCOUNTER — Telehealth: Payer: Self-pay | Admitting: Obstetrics and Gynecology

## 2019-06-12 NOTE — Telephone Encounter (Addendum)
44 yo female with frequent labial abscesses and hidradenitis calling with a fever. She was seen in the office yesterday and started on PO clindamycin; no I&D performed. She reports increasing pain tonight and then had spontaneous drainage of copious bloody/purulent, foul-smelling liquid. She then had a temperature of 102F. Pain is much improved after spontaneous drainage and patient is feeling well overall without body aches/chills/weakness. Blood sugars just started to climb to the 200s; has had good control recently (last HbA1c 6.7). She is motivated to stay out of the ED tonight. Advised patient to take her temperature again within an hour. She will call back if fever persists, if she develops worsening pain, or if BGs remain elevated. Continue to take PO antibiotic as prescribed. Stressed importance calling back with new or worsening symptoms.    Clarice Pole, MD  Obstetrics/Gynecology, PGY4  Pager [phone]    Addendum:    Called back patient ~3 hours later. She is feeling better- her fever broke and she is back down to 76F. BGs are better controlled as well. Again stressed importance of continuing PO antibiotics and reiterated return precautions.    Clarice Pole, MD  Obstetrics/Gynecology, [place]  Pager [phone]

## 2019-06-14 ENCOUNTER — Telehealth: Payer: Self-pay

## 2019-06-14 ENCOUNTER — Ambulatory Visit: Payer: Medicare (Managed Care) | Attending: Psychiatry

## 2019-06-14 DIAGNOSIS — F603 Borderline personality disorder: Secondary | ICD-10-CM | POA: Insufficient documentation

## 2019-06-14 DIAGNOSIS — F331 Major depressive disorder, recurrent, moderate: Secondary | ICD-10-CM

## 2019-06-14 DIAGNOSIS — F4312 Post-traumatic stress disorder, chronic: Secondary | ICD-10-CM

## 2019-06-14 DIAGNOSIS — F422 Mixed obsessional thoughts and acts: Secondary | ICD-10-CM | POA: Insufficient documentation

## 2019-06-14 NOTE — Progress Notes (Signed)
The Probation officer called and spoke to Kaylee Harris.  Kaylee Harris reports that she is doing well, but had recently a fever due to a abscess on her labia.  Kaylee Harris reports that she has been to the OBGYN regarding the abscess.  She reports that the infection opened and drained and now her fever has subsided.  Kaylee Harris reports that she is under observation of her OBGYN.  The Probation officer then informed Kaylee Harris of a program called [facility] where they provide assistance with food and personal hygeine supplies.  Kaylee Harris reports that she is interested and the Probation officer signed Kaylee Harris up online at [email or web address].  Kaylee Harris thanked the Probation officer.  The Probation officer then checked medical records and reminded Kaylee Harris of her appointment today at 3pm with therapist, Tresa Res.  Kaylee Harris thanked the Probation officer and reports needing nothing else at this time.

## 2019-06-14 NOTE — Telephone Encounter (Signed)
Kaylee Harris called. Pt has a recent (Dec 2020 ) Rx for doxy hyclate. But previously she has filled Doxy monohydrate. Pharmacy needs clarification on which to fill? Routing to prescriber.

## 2019-06-14 NOTE — Progress Notes (Signed)
Behavioral Health Progress Note   Patient encounter was performed via Video     Patient located at: home    Provider located at: home office    Other participants in this encounter and roles:  N/A    Consent was previously obtained from the patient to complete this service via telehealth; including the potential for financial liability.  Length of Session: 45 minutes    Contact Type:  Location: Telemedicine Covid-19    Other: Video     Problem(s)/Goals Addressed from Treatment Plan:    Problem 1:   Treatment Problem #1 04/08/2019   Patient Identified Problem OCD       Goal for this problem:    Treatment Goal #1 04/08/2019   Patient Identified Goal Utilize CBT to reduce behaviors       Progress towards this goal: Patient still processing the loss of her father last month.    Mental Status Exam:  APPEARANCE: Appears stated age, Well-groomed, Casual  ATTITUDE TOWARD INTERVIEWER: Cooperative  MOTOR ACTIVITY: WNL (within normal limits)  EYE CONTACT: Direct  SPEECH: Normal rate and tone  AFFECT: Full Range  MOOD: Anxious and Depressed  THOUGHT PROCESS: Normal and Circumstantial  THOUGHT CONTENT: No unusual themes  PERCEPTION: No evidence of hallucinations  CURRENT SUICIDAL IDEATION: patient denies  CURRENT HOMICIDAL IDEATION: Patient denies  ORIENTATION: Alert and Oriented X 3.  CONCENTRATION: Good  MEMORY:   Recent: intact   Remote: intact  COGNITIVE FUNCTION: Average intelligence  JUDGMENT: Intact  IMPULSE CONTROL: Fair  INSIGHT: Fair    Risk Assessment:  ASSESSMENT OF RISK FOR SUICIDAL BEHAVIOR  Changes in risk for suicide from baseline Formulation of Risk and/or previous intake, including newly identified risk, if any: none  Violence risk was assessed and No Change noted from baseline formulation of risk and/or previous assessment.    Session Content::  Patient still processing the loss of her father last month. Patient told Probation officer that she continues to think about what he might have been trying to say to her. Writer  and patient discussed the stages of grief. Patient said that she thinks she is still in denial but also goes to sadness. She said that she worries a lot about what her mother is experiencing. Patient talked about the service and processed her emotions around this. Writer validated patient's thoughts and feelings. Writer asked patient about her memories of her father when he was well. Patient said that she used to go the public market on saturdays with him. She said that he taught her how to cook and favorite dishes he would make. Patient told Probation officer that she has been picking at fingers again. Writer talked to patient about habit reversal and patient said that she would work on this.    Visit Diagnosis:      ICD-10-CM ICD-9-CM   1. Borderline personality disorder  F60.3 301.83   2. MDD (major depressive disorder), recurrent episode, moderate  F33.1 296.32   3. Chronic post-traumatic stress disorder (PTSD)  F43.12 309.81   4. Mixed obsessional thoughts and acts  F42.2 300.3       Interventions:  Bereavement Therapy  Supportive Psychotherapy  Habit reversal    Current Treatment Plan   Created/Updated On 04/08/2019   Next Treatment Plan Due 07/06/2019     Consent was previously obtained from the patient to complete this Video consult; including the potential for financial liability.    Time spent on the  Video with patient: 40+ minutes  Length of time compiling the report:15 minutes    Plan:  Psychotherapy continues as described in care plan; plan remains the same.    NEXT APPT: 06/30/19.      Tresa Res, LCSW-R

## 2019-06-15 NOTE — Telephone Encounter (Signed)
Per previous encounter 12/14: Clinically I am unsure of difference in absorption or pharmakinetics. I think that both should be equal unless pharmacist feel different. Message routed to nursing for communication.    Previous pharmacy response -- The pharmacist says that both medications are equal in effectiveness    Message to nursing for pharmacy contact.    Elliot Gault, MD  OB/GYN Attending  Signed 06/15/2019 at 9:32 AM

## 2019-06-19 ENCOUNTER — Encounter: Payer: Self-pay | Admitting: Obstetrics and Gynecology

## 2019-06-24 ENCOUNTER — Other Ambulatory Visit: Payer: Self-pay | Admitting: Primary Care

## 2019-06-24 DIAGNOSIS — Z760 Encounter for issue of repeat prescription: Secondary | ICD-10-CM

## 2019-06-24 MED ORDER — ALBUTEROL SULFATE HFA 108 (90 BASE) MCG/ACT IN AERS *I*
INHALATION_SPRAY | RESPIRATORY_TRACT | 1 refills | Status: DC
Start: 2019-06-24 — End: 2019-12-13

## 2019-06-24 MED ORDER — TRAMADOL HCL 50 MG PO TABS *I*
ORAL_TABLET | ORAL | 0 refills | Status: DC
Start: 2019-06-24 — End: 2019-07-27

## 2019-06-24 NOTE — Telephone Encounter (Signed)
Last appt: 05/05/2019     Next appt:  08/03/2019        Recent Lab Values 04/14/2019 01/04/2018 12/17/2017 10/17/2017 10/17/2017 03/31/2017 10/29/2016   EXP DATE 2019-08-04 9/20 02/02/2018 11-03-18 08-04-18 04/04/18 04/04/2017   [facility] - - - - - - -

## 2019-06-24 NOTE — Telephone Encounter (Signed)
Confidential Drug Report  Search Terms: [name], 06/29/1975   Search Date: 06/24/2019 15:31:11 PM   Searching on behalf of: [id number] - [name]   The Drug Utilization Report below displays all of the controlled substance prescriptions, if any, that your patient has filled in the last twelve months. The information displayed on this report is compiled from pharmacy submissions to the Department, and accurately reflects the information as submitted by the pharmacies.  This report was requested by: Maretta Bees   Reference #: [id number]   Others' Prescriptions  Patient Name: Kaylee Harris   Birth Date: Mar 28, 1976   Address: [place], [place] [address]   Sex: Female   Rx Written Rx Dispensed Drug Quantity Days Supply Prescriber Name Payment Method Dispenser   06/11/2019 06/11/2019 oxycodone hcl 5 mg tablet  6 [place], [place]. #02   05/12/2019 05/19/2019 tramadol hcl 50 mg tablet  42 14 [place], [place]. #19   04/10/2019 04/13/2019 tramadol hcl 50 mg tablet  80 14 [name], Darnell Level (MD) [place]. #19   02/26/2019 03/02/2019 clonazepam 0.5 mg tablet  28 28 [place], [place]. #19   02/25/2019 02/26/2019 diphenoxylate-atropine 2.5-0.025 mg tablet  240 30 [place], [place]. #19   02/25/2019 02/25/2019 tramadol hcl 50 mg tablet  42 14 [place], [place]. #19   01/26/2019 01/29/2019 tramadol hcl 50 mg tablet  42 14 [place], [place]. #19   12/18/2018 12/21/2018 tramadol hcl 50 mg tablet  55 14 [name], Darnell Level (MD) [place]. #19   11/12/2018 11/15/2018 diphenoxylate-atropine 2.5-0.025 mg tablet  240 30 [place], [place]. #19   10/13/2018 10/15/2018 tramadol hcl 50 mg tablet  48 14 [place], [place]. #19   08/24/2018 08/27/2018 clonazepam 0.5 mg tablet  28 28 [place], [place]. #19   08/20/2018 08/21/2018 tramadol hcl 50 mg tablet  96 14 [place], [place]. #19   07/22/2018 07/22/2018 clonazepam 0.5 mg tablet  28 28 [name], G (MD) [place]. #02   * - Drugs marked with an asterisk are compound drugs. If the compound drug is made up of more than one controlled substance, then each controlled substance will be a separate row in the table.

## 2019-06-26 ENCOUNTER — Other Ambulatory Visit: Payer: Self-pay | Admitting: Obstetrics and Gynecology

## 2019-06-30 ENCOUNTER — Other Ambulatory Visit: Payer: Self-pay

## 2019-06-30 ENCOUNTER — Other Ambulatory Visit: Payer: Self-pay | Admitting: Family Medicine

## 2019-06-30 ENCOUNTER — Ambulatory Visit: Payer: Medicare (Managed Care)

## 2019-06-30 DIAGNOSIS — F4312 Post-traumatic stress disorder, chronic: Secondary | ICD-10-CM

## 2019-06-30 DIAGNOSIS — E1165 Type 2 diabetes mellitus with hyperglycemia: Secondary | ICD-10-CM

## 2019-06-30 DIAGNOSIS — F422 Mixed obsessional thoughts and acts: Secondary | ICD-10-CM

## 2019-06-30 DIAGNOSIS — F331 Major depressive disorder, recurrent, moderate: Secondary | ICD-10-CM

## 2019-06-30 MED ORDER — BD SINGLE USE SWABS REGULAR PADS
MEDICATED_PAD | 1 refills | Status: DC
Start: 2019-06-30 — End: 2019-07-05
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
## 2019-06-30 NOTE — Telephone Encounter (Signed)
Last appt: 05/05/2019     Next appt:  08/03/2019        Recent Lab Values 04/14/2019 01/04/2018 12/17/2017 10/17/2017 10/17/2017 03/31/2017 10/29/2016   EXP DATE 2019-08-04 9/20 02/02/2018 11-03-18 08-04-18 04/04/18 04/04/2017   [facility] - - - - - - -

## 2019-07-01 ENCOUNTER — Ambulatory Visit: Payer: Medicare (Managed Care) | Admitting: Dermatology

## 2019-07-01 VITALS — BP 130/84 | Temp 97.8°F | Ht 68.0 in | Wt 262.0 lb

## 2019-07-01 DIAGNOSIS — L732 Hidradenitis suppurativa: Secondary | ICD-10-CM

## 2019-07-01 DIAGNOSIS — D1801 Hemangioma of skin and subcutaneous tissue: Secondary | ICD-10-CM

## 2019-07-01 DIAGNOSIS — D229 Melanocytic nevi, unspecified: Secondary | ICD-10-CM

## 2019-07-01 MED ORDER — DOXYCYCLINE HYCLATE 100 MG PO TABS *I*
100.0000 mg | ORAL_TABLET | Freq: Two times a day (BID) | ORAL | 0 refills | Status: DC
Start: 2019-07-01 — End: 2019-07-23

## 2019-07-01 MED ORDER — BENZOYL PEROXIDE 10 % EX LIQD *I*
Freq: Two times a day (BID) | CUTANEOUS | 3 refills | Status: AC
Start: 2019-07-01 — End: ?

## 2019-07-01 NOTE — Progress Notes (Addendum)
Chief Complaint   Patient presents with    New Patient Visit     hidradenitis, lesions on face     Derm History: HS for 25 years   ?  HPI:   Kaylee Harris is a pleasant 44 y.o. female last seen 11/2013 by Dr. Joie Bimler for benign nevus on her nose and a subungal hematoma. She has returned for the following:     Concern:   1. management of hidradenitis under her arms, breast, and groin for many years managed by her gynecologist . Currently, on doxycycline 100 mg over the last 2 months, Dial Soap, and clindamycin solution.   2. Moles on her face that appeared about 6 months ago. Denies pain or itching.  3. Red spot on her right breast  ?   Physical Exam:   Vitals:    07/01/19 0947   BP: 130/84   Temp: 36.6 C (97.8 F)   Weight: 118.8 kg (262 lb)   Height: 1.727 m (5\' 8" )   -hyperpigmented macule on left axilla; no active boils or extensive scarring present  -brown, macules with regular border and even pigmentation on left temple, right cheek, and back  -scattered cherry red, non-blanching macule on right breast     Assessment/Plan:    1. Hidradenitis Suppurativa: Stage 1, chronic, not active today   -chronic destruction of terminal follicular epithelium and apocrine bearing skin; associated with obesity and smoking.  -Discussed that there is no cure for this disease but smoking cessation and weight loss may be beneficial   -Continue clindamycin lotion to the affected areas 1-2 times daily  -Start benzoyl peroxide 5-10% wash daily to affected areas.   -This can bleach clothes or towels if not washed off well and can also dry your skin out. Reduce amount and frequency based on dryness and irritation.  -Over the counter:  Panoxyl or Oxy wash  -Start chlorhexidine wash daily.    -Apply once daily in the shower. Lather to the area, let sit for 5 minutes prior to washing off, other options include Lever 2000 soap or benzoyl peroxide  -If feel like boil is coming on, then doxycycline 100 mg twice a day for 1 week to prevent  painful boil     2. Nevi, Benign: left temple, right cheek, back    -Benign nature discussed with patient and reassurance provided  -No treatment necessary  -Sun protection with sun avoidance, sun protective clothing, and sunscreen (SPF 30+) recommended  -Suggest patient check his/her own skin once monthly to look for concerning lesions, including non-healing, tender, or bleeding lesions, or new or changing moles  -Monitor moles for the following changes:    A: asymmetry   B: border irregularity   C: color changes, multiple different colors (esp. areas of black, red, or white coloration)   D: diameter greater than 44mm, or the size of a pencil eraser   E: evolution, or a change in size, shape, or color  -Patient instructed to have a doctor evaluate the lesions if they were to have the changes listed above or if they were to become symptomatic (bleed, itch, grow rapidly)    3. Cherry Angiomas: right breast   -Benign nature discussed with patient and reassurance provided  -No treatment necessary    Return to clinic: in 1 year(s)     Philis Pique, NP  07/01/19  12:59 PM    I have seen and examined this patient with Luther Redo, NP, and agree with  her assessment, physical exam and plan. The primary encounter diagnosis was Hidradenitis suppurativa. Diagnoses of Multiple benign nevi and Cherry angioma were also pertinent to this visit. Recommend management as noted with doxy bursts for flares.    Earl Gala, MD

## 2019-07-01 NOTE — Patient Instructions (Addendum)
1. Hidradenitis Suppurativa: Stage 1   -chronic destruction of terminal follicular epithelium and apocrine bearing skin; associated with obesity and smoking.  -Discussed that there is no cure for this disease but smoking cessation and weight loss may be beneficial   -Continue clindamycin lotion to the affected areas 1-2 times daily  -Start benzoyl peroxide 5-10% wash daily to affected areas.   -This can bleach clothes or towels if not washed off well and can also dry your skin out. Reduce amount and frequency based on dryness and irritation.  -Over the counter:  Panoxyl or Oxy wash  -Start chlorhexidine wash daily.    -Apply once daily in the shower. Lather to the area, let sit for 5 minutes prior to washing off, other options include Lever 2000 soap or benzoyl peroxide  -If feel like boil is coming on, then doxycycline 100 mg twice a day for 1 week to prevent painful boil     2. Nevi, Benign: left temple, right cheek, back    -Benign nature discussed with patient and reassurance provided  -No treatment necessary  -Sun protection with sun avoidance, sun protective clothing, and sunscreen (SPF 30+) recommended  -Suggest patient check his/her own skin once monthly to look for concerning lesions, including non-healing, tender, or bleeding lesions, or new or changing moles  -Monitor moles for the following changes:    A: asymmetry   B: border irregularity   C: color changes, multiple different colors (esp. areas of black, red, or white coloration)   D: diameter greater than 43mm, or the size of a pencil eraser   E: evolution, or a change in size, shape, or color  -Patient instructed to have a doctor evaluate the lesions if they were to have the changes listed above or if they were to become symptomatic (bleed, itch, grow rapidly)    3. Cherry Angiomas: right breast   -Benign nature discussed with patient and reassurance provided  -No treatment necessary                    Follow up 1 year

## 2019-07-05 ENCOUNTER — Other Ambulatory Visit: Payer: Self-pay

## 2019-07-05 DIAGNOSIS — E1165 Type 2 diabetes mellitus with hyperglycemia: Secondary | ICD-10-CM

## 2019-07-05 MED ORDER — BD SINGLE USE SWABS REGULAR PADS
MEDICATED_PAD | 1 refills | Status: DC
Start: 2019-07-05 — End: 2019-10-26

## 2019-07-05 NOTE — Telephone Encounter (Signed)
Last appt: 05/05/2019     Next appt:  08/03/2019        Recent Lab Values 04/14/2019 01/04/2018 12/17/2017 10/17/2017 10/17/2017 03/31/2017 10/29/2016   EXP DATE 2019-08-04 9/20 02/02/2018 11-03-18 08-04-18 04/04/18 04/04/2017   [facility] - - - - - - -       Please send to [facility] [place]

## 2019-07-14 ENCOUNTER — Other Ambulatory Visit: Payer: Self-pay

## 2019-07-14 MED ORDER — PANTOPRAZOLE SODIUM 40 MG PO TBEC *I*
40.0000 mg | DELAYED_RELEASE_TABLET | Freq: Every day | ORAL | 3 refills | Status: DC
Start: 2019-07-14 — End: 2020-09-01

## 2019-07-14 NOTE — Telephone Encounter (Signed)
Last appt: 05/05/2019     Next appt:  08/03/2019        Recent Lab Values 04/14/2019 01/04/2018 12/17/2017 10/17/2017 10/17/2017 03/31/2017 10/29/2016   EXP DATE 2019-08-04 9/20 02/02/2018 11-03-18 08-04-18 04/04/18 04/04/2017   [facility] - - - - - - -

## 2019-07-19 ENCOUNTER — Ambulatory Visit: Payer: Medicare (Managed Care)

## 2019-07-21 ENCOUNTER — Ambulatory Visit: Payer: Medicare (Managed Care) | Attending: Psychiatry

## 2019-07-21 DIAGNOSIS — F4312 Post-traumatic stress disorder, chronic: Secondary | ICD-10-CM | POA: Insufficient documentation

## 2019-07-21 DIAGNOSIS — F603 Borderline personality disorder: Secondary | ICD-10-CM | POA: Insufficient documentation

## 2019-07-21 DIAGNOSIS — F331 Major depressive disorder, recurrent, moderate: Secondary | ICD-10-CM | POA: Insufficient documentation

## 2019-07-21 DIAGNOSIS — Z7189 Other specified counseling: Secondary | ICD-10-CM | POA: Insufficient documentation

## 2019-07-21 DIAGNOSIS — F422 Mixed obsessional thoughts and acts: Secondary | ICD-10-CM | POA: Insufficient documentation

## 2019-07-21 NOTE — BH Treatment Plan (Signed)
[facility] Treatment Plan     Date of Plan:   Created/Updated On 07/21/2019   FROM 07/21/2019      Created/Updated On 07/21/2019   Next Treatment Plan Due 01/20/2020       Diagnostic Impression    ICD-10-CM ICD-9-CM   1. Chronic post-traumatic stress disorder (PTSD)  F43.12 309.81   2. Mixed obsessional thoughts and acts  F42.2 300.3   3. Bereavement counseling  Z71.89 V65.40       Strengths  Strengths derived from the assessment include: Patient is hard working, has supports.    Problem Areas  *At least one problem must be targeted toward risk reduction if Formulation of Risk or any other previous exam indicated special monitoring or intervention for suicide and/or violence risk indicated.    PROBLEM AREAS (choose and describe relevant):  THOUGHT: Negative  MOOD:Depressed and anxious  BEHAVIOR: Reactive and avoidant  ECONOMIC:SSDI  ________________________________________________________________  Treatment Problem #1 07/21/2019   Patient Identified Problem Bereavement        Treatment Goal #1 07/21/2019   Patient Identified Goal Process grief stages       The rationale for addressing this problem is that resolving it will (select all that apply):  Reduce symptoms of disorder, Reduce functional impairment associated with disorder, Decrease likelihood of hospitalization, Facilitate transfer skills learned in therapy to everday life, Is a key motivational factor for the patient's participation in treatment, Reduce risk for suicide* and Reduce risk for violence*      Progress toward goal(s): Patient's father died in 19-Aug-2022 and patient is working through her grief and loss. Patient is not on the Suicide Care Pathway. Patient not available to sign treatment plan due to Covid-19.    1 a. Measurable Objectives : Patient will make and keep regular individual therapy appointments.   Date established: 05/31/14   Target date: 01/20/20.   Attained or Revised? continue    1 b. Measurable Objectives : Patient will continue  to learn and utilize skills to manage affect.   Date established: 08/31/18   Target date: 01/20/20.   Attained or Revised? continue    ______________________________________________________________________      Plan  TREATMENT MODALITIES:  Individual psychotherapy for 30-60 min Q 1-3 weeks with Tresa Res, LCSW.    DISCHARGE CRITERIA for this treatment setting: Patient reports a reduction in symptoms as evidenced by PHQ-9 and GAD-7 scores.    Clinician's name: Tresa Res, LCSW    Psychiatrist's Name: Cline Crock, MD      Patient/Family Statement  PATIENT/FAMILY STATEMENT:  Obtain patient and family input into the treatment plan, including areas of agreement / disagreement.  Obtain patient's signature - if not possible, briefly describe the reason.     Patient Comments:          I HAVE PARTICIPATED IN THE DEVELOPMENT OF THIS TREATMENT PLAN AND I AGREE WITH ITS CONTENTS:       Patient Signature: _______________________________________________________    Date: ______________________________

## 2019-07-21 NOTE — Progress Notes (Signed)
Behavioral Health Progress Note   Patient encounter was performed via Video     Patient located at: home    Provider located at: home office    Other participants in this encounter and roles:  N/A    Consent was previously obtained from the patient to complete this service via telehealth; including the potential for financial liability.  Length of Session: 45 minutes    Contact Type:  Location: Telemedicine Covid-19    Other: Video     Problem(s)/Goals Addressed from Treatment Plan:    Problem 1:   Treatment Problem #1 07/21/2019   Patient Identified Problem Bereavement       Goal for this problem:    Treatment Goal #1 07/21/2019   Patient Identified Goal Process grief stages       Progress towards this goal: Patient reports continuing to process her grief.    Mental Status Exam:  APPEARANCE: Appears stated age, Casual  ATTITUDE TOWARD INTERVIEWER: Cooperative  MOTOR ACTIVITY: WNL (within normal limits)  EYE CONTACT: Direct  SPEECH: Normal rate and tone  AFFECT: Full Range  MOOD: Normal and Sad  THOUGHT PROCESS: Normal  THOUGHT CONTENT: No unusual themes  PERCEPTION: No evidence of hallucinations  CURRENT SUICIDAL IDEATION: patient denies  CURRENT HOMICIDAL IDEATION: Patient denies  ORIENTATION: Alert and Oriented X 3.  CONCENTRATION: Good  MEMORY:   Recent: intact   Remote: intact  COGNITIVE FUNCTION: Average intelligence  JUDGMENT: Intact  IMPULSE CONTROL: Good  INSIGHT: Good    Risk Assessment:  ASSESSMENT OF RISK FOR SUICIDAL BEHAVIOR  Changes in risk for suicide from baseline Formulation of Risk and/or previous intake, including newly identified risk, if any: none  Violence risk was assessed and No Change noted from baseline formulation of risk and/or previous assessment.    Session Content:: Patient reports continuing to process her grief. Patient talked about processing her anger about her father being gone. She said that Kaylee Harris expressed her concern but then understood when she explained what was going on.  Patient said that she has been working on a memory book and is enjoying this. She said that she is annoyed with her sister for copying her. Patient said that she continues to worry about Kaylee Harris and fears about her declining health. Writer provided support and validation.    Visit Diagnosis:      ICD-10-CM ICD-9-CM   1. Chronic post-traumatic stress disorder (PTSD)  F43.12 309.81   2. Mixed obsessional thoughts and acts  F42.2 300.3   3. Bereavement counseling  Z71.89 V65.40       Interventions:  Bereavement Therapy  Supportive Psychotherapy    Current Treatment Plan   Created/Updated On 07/21/2019   Next Treatment Plan Due 01/20/2020     Consent was previously obtained from the patient to complete this Video consult; including the potential for financial liability.    Time spent on the  Video with patient: 40+ minutes    Length of time compiling the report:15 minutes    Plan:  Psychotherapy continues as described in care plan; plan remains the same.    NEXT APPT: 08/06/19.      Tresa Res, LCSW-R

## 2019-07-23 ENCOUNTER — Other Ambulatory Visit: Payer: Self-pay | Admitting: Dermatology

## 2019-07-26 ENCOUNTER — Other Ambulatory Visit: Payer: Self-pay

## 2019-07-26 DIAGNOSIS — D72829 Elevated white blood cell count, unspecified: Secondary | ICD-10-CM

## 2019-07-26 DIAGNOSIS — E1165 Type 2 diabetes mellitus with hyperglycemia: Secondary | ICD-10-CM

## 2019-07-26 NOTE — Progress Notes (Signed)
Pre-Visit Planning    Health Maintenance Due   Topic Date Due    HIV Screening [place]  Never done    Hepatitis C Screening  Never done    Diabetic Foot Exam ADA  02/07/2019    Diabetic A1C Monitoring ADA  05/28/2019       Lab Results   Component Value Date    HA1C 6.7 (H) 11/25/2018     Lab Results   Component Value Date    MALBR <1.20 11/25/2018       Notes:  - A1c -DUE     - Foot exam - DUE     Completed on 07/26/19 by Tanna Savoy

## 2019-07-27 ENCOUNTER — Other Ambulatory Visit: Payer: Self-pay | Admitting: Family Medicine

## 2019-07-27 ENCOUNTER — Other Ambulatory Visit: Payer: Self-pay | Admitting: Primary Care

## 2019-07-27 DIAGNOSIS — Z760 Encounter for issue of repeat prescription: Secondary | ICD-10-CM

## 2019-07-27 DIAGNOSIS — K219 Gastro-esophageal reflux disease without esophagitis: Secondary | ICD-10-CM

## 2019-07-28 ENCOUNTER — Ambulatory Visit: Payer: Medicare (Managed Care) | Attending: Endocrinology | Admitting: Nutrition

## 2019-07-28 ENCOUNTER — Other Ambulatory Visit
Admission: RE | Admit: 2019-07-28 | Discharge: 2019-07-28 | Disposition: A | Discharge status: Home / Self Care | Payer: Medicare (Managed Care) | Source: Ambulatory Visit | Attending: Primary Care | Admitting: Primary Care

## 2019-07-28 VITALS — Wt 264.0 lb

## 2019-07-28 DIAGNOSIS — D72829 Elevated white blood cell count, unspecified: Secondary | ICD-10-CM | POA: Insufficient documentation

## 2019-07-28 DIAGNOSIS — E1165 Type 2 diabetes mellitus with hyperglycemia: Secondary | ICD-10-CM

## 2019-07-28 DIAGNOSIS — E119 Type 2 diabetes mellitus without complications: Secondary | ICD-10-CM | POA: Insufficient documentation

## 2019-07-28 DIAGNOSIS — Z794 Long term (current) use of insulin: Secondary | ICD-10-CM | POA: Insufficient documentation

## 2019-07-28 LAB — CBC AND DIFFERENTIAL
Baso # K/uL: 0.1 10*3/uL (ref 0.0–0.1)
Basophil %: 0.5 %
Eos # K/uL: 0.2 10*3/uL (ref 0.0–0.4)
Eosinophil %: 1.4 %
Hematocrit: 43 % (ref 34–45)
Hemoglobin: 13 g/dL (ref 11.2–15.7)
IMM Granulocytes #: 0.1 10*3/uL — ABNORMAL HIGH (ref 0.0–0.0)
IMM Granulocytes: 0.7 %
Lymph # K/uL: 2.7 10*3/uL (ref 1.2–3.7)
Lymphocyte %: 24.4 %
MCH: 27 pg (ref 26–32)
MCHC: 31 g/dL — ABNORMAL LOW (ref 32–36)
MCV: 89 fL (ref 79–95)
Mono # K/uL: 0.7 10*3/uL (ref 0.2–0.9)
Monocyte %: 5.9 %
Neut # K/uL: 7.5 10*3/uL — ABNORMAL HIGH (ref 1.6–6.1)
Nucl RBC # K/uL: 0 10*3/uL (ref 0.0–0.0)
Nucl RBC %: 0 /100 WBC (ref 0.0–0.2)
Platelets: 373 10*3/uL — ABNORMAL HIGH (ref 160–370)
RBC: 4.8 MIL/uL (ref 3.9–5.2)
RDW: 13.5 % (ref 11.7–14.4)
Seg Neut %: 67.1 %
WBC: 11.1 10*3/uL — ABNORMAL HIGH (ref 4.0–10.0)

## 2019-07-28 LAB — HEMOGLOBIN A1C: Hemoglobin A1C: 6.4 % — ABNORMAL HIGH

## 2019-07-28 MED ORDER — FAMOTIDINE 20 MG PO TABS *I*
20.0000 mg | ORAL_TABLET | Freq: Every evening | ORAL | 1 refills | Status: DC | PRN
Start: 2019-07-28 — End: 2020-01-26

## 2019-07-28 MED ORDER — TRAMADOL HCL 50 MG PO TABS *I*
ORAL_TABLET | ORAL | 0 refills | Status: DC
Start: 2019-07-28 — End: 2019-09-22

## 2019-07-28 NOTE — Telephone Encounter (Signed)
Confidential Drug Report  Search Terms: [name], [birth year]   Search Date: 07/28/2019 08:23:04 AM   Searching on behalf of: [id number] - [name]   The Drug Utilization Report below displays all of the controlled substance prescriptions, if any, that your patient has filled in the last twelve months. The information displayed on this report is compiled from pharmacy submissions to the Department, and accurately reflects the information as submitted by the pharmacies.  This report was requested by: Maretta Bees   Reference #: [id number]   Others' Prescriptions  Patient Name: Kaylee Harris   Birth Date: [birth year]   Address: [address], [place] [address]   Sex: Female   Rx Written Rx Dispensed Drug Quantity Days Supply Prescriber Name Payment Method Dispenser   07/13/2019 07/13/2019 hydrocodone-acetaminophen 5-325 mg tablet  30 5 Melody Comas DMD [place]. #02   06/24/2019 06/28/2019 tramadol hcl 50 mg tablet  42 14 Costello, [place]. #19   06/11/2019 06/11/2019 oxycodone hcl 5 mg tablet  6 3 Rene Kocher, [place]. #02   05/12/2019 05/19/2019 tramadol hcl 50 mg tablet  42 14 Burke, [place]. #19   04/10/2019 04/13/2019 tramadol hcl 50 mg tablet  7 14 [name], Darnell Level (MD) [place]. #19   02/26/2019 03/02/2019 clonazepam 0.5 mg tablet  28 28 Burke, [place]. #19   02/25/2019 02/26/2019 diphenoxylate-atropine 2.5-0.025 mg tablet  240 30 Burke, [place]. #19   02/25/2019 02/25/2019 tramadol hcl 50 mg tablet  42 14 Burke, [place]. #19   01/26/2019 01/29/2019 tramadol hcl 50 mg tablet  42 14 Burke, [place]. #19   12/18/2018 12/21/2018 tramadol hcl 50 mg tablet  19 14 [name], Darnell Level (MD) [place]. #19   11/12/2018 11/15/2018 diphenoxylate-atropine 2.5-0.025 mg tablet  240 30 Grove, [place]. #19   10/13/2018 10/15/2018 tramadol hcl 50 mg tablet  34 14 Burke, [place]. #19   08/24/2018 08/27/2018 clonazepam 0.5 mg tablet  28 28 Grove, [place]. #19   08/20/2018 08/21/2018 tramadol hcl 50 mg tablet  38 14 Grove, [place]. #19   * - Drugs marked with an asterisk are compound drugs. If the compound drug is made up of more than one controlled substance, then each controlled substance will be a separate row in the table.

## 2019-07-28 NOTE — Telephone Encounter (Signed)
We do not prescribe this. Was sent in on 07/23/19 by Salvatore Marvel NP

## 2019-07-28 NOTE — Progress Notes (Signed)
[facility] regarding Insulin requiring Type 2 Diabetes. Referred by: PCP; FOLLOWED BY ENDOCRINOLOGY      SUBJECTIVE  Still grieving loss of dad 2 months ago; getting counseling from social worker    DM meds  restarted Trulicity lower dose x 2 weeks- no GI side effects   Omni pod + Dexcom  Going well      Wt Readings from Last 3 Encounters:   07/28/19 119.7 kg (264 lb)   07/01/19 118.8 kg (262 lb)   06/11/19 119.2 kg (262 lb 12.8 oz)     Lab Results   Component Value Date    HA1C 6.7 (H) 11/25/2018    HA1C 6.8 (H) 04/20/2018    HA1C 7.0 (H) 01/16/2018    MALBR <1.20 11/25/2018    CREAT 0.81 04/14/2019    LDLC 84 11/25/2018       Diet: 2 meals + over on snacks- eve 90-100g carbs ;    Beverages: Powerade Zero; water, coffee, creamer   ETOH: no  Exercise : sedentary  ; dizzy spells    Meter: dexcom 6 report reviewed      Assessment:   DM- excellent DM control   Emotional eating- will try strategies below   Obesity down 25 lb over 18 mo  Exercise very limited    Coun/Edu:     Barriers to learning:  Neuro problems  Education Provided: Plate Method label reading and carb factors    Plan:   Nutrition Goals:   1500 calories  150-180 gm carbs  Initial Weight goal: (-10%) 270 lb  Patient Instructions     Reduce eve snack to 30g total  Step down from current 100g to 75g, then 50, then 30     OK to eat a variety, including cookies, without guilt  1. Use distractions- adult coloring books  2. Use delays , wait at least minutes before second snack   3. walk the dogs, pet the dogs more often     Call office for [facility] for endo appt       Follow-up visit: 4 mo   Time spent counseling patient: 30 minutes    [employer] insurance MD follow up referral is in eRecord  .  MNT 1.5 hrs remain   Expires 05/05/20    Marjo Bicker, RD

## 2019-07-28 NOTE — Patient Instructions (Addendum)
Reduce eve snack to 30g total  Step down from current 100g to 75g, then 50, then 30     OK to eat a variety, including cookies, without guilt  1. Use distractions- adult coloring books  2. Use delays , wait at least minutes before second snack   3. walk the dogs, pet the dogs more often     Call office for [place] for endo appt

## 2019-07-29 NOTE — Progress Notes (Signed)
The writer called Maylena to see how she is doing and to address care plan goals. The Probation officer reviewed the care plan with Katie.  Tayloranne reports that she would like to add that the writer will communicate with providers as needed and that she would like to change the food cupboard listings to food resources.  Khristine reports no changes to the crisis plan at this time.  The writer helped [place] navigate the process of trying to get CDPAS for her self and her significant other, Kennia Schlotterbeck.  The writer then checked medical records and reminded Cytlali of her upcoming appointments on March 30th at 4:40pm with PCP, [name], and on April 2nd at 2pm with Therapist, Tresa Res.  The writer set up an appointment to meet with Bethena Roys on 08/12/19 at 1pm.  The writer then spoke to [place] about food, and found that [place] really enjoyed the food delivery from [email or web address].  The writer then set up the next deliver for next month for Kerrianne with [email or web address] by filling out the online request for with [place] today.  Delonna thanked the Probation officer for the assistance.  Koriana reports needing nothing else at this time.

## 2019-07-30 ENCOUNTER — Encounter: Payer: Self-pay | Admitting: Primary Care

## 2019-07-30 ENCOUNTER — Other Ambulatory Visit: Payer: Self-pay | Admitting: Gastroenterology

## 2019-07-30 LAB — HM MAMMOGRAPHY

## 2019-08-02 ENCOUNTER — Telehealth: Payer: Medicare (Managed Care) | Admitting: Endocrinology

## 2019-08-03 ENCOUNTER — Encounter: Payer: Self-pay | Admitting: Primary Care

## 2019-08-03 ENCOUNTER — Other Ambulatory Visit: Payer: Self-pay | Admitting: Primary Care

## 2019-08-03 ENCOUNTER — Ambulatory Visit: Payer: Medicare (Managed Care) | Admitting: Primary Care

## 2019-08-03 VITALS — BP 127/68 | HR 67 | Temp 98.0°F | Ht 67.99 in | Wt 262.0 lb

## 2019-08-03 DIAGNOSIS — Z0000 Encounter for general adult medical examination without abnormal findings: Secondary | ICD-10-CM

## 2019-08-03 DIAGNOSIS — Z634 Disappearance and death of family member: Secondary | ICD-10-CM

## 2019-08-03 DIAGNOSIS — L732 Hidradenitis suppurativa: Secondary | ICD-10-CM | POA: Insufficient documentation

## 2019-08-03 DIAGNOSIS — M47816 Spondylosis without myelopathy or radiculopathy, lumbar region: Secondary | ICD-10-CM

## 2019-08-03 DIAGNOSIS — E119 Type 2 diabetes mellitus without complications: Secondary | ICD-10-CM

## 2019-08-03 MED ORDER — DICYCLOMINE HCL 10 MG PO CAPS *I*
10.0000 mg | ORAL_CAPSULE | Freq: Four times a day (QID) | ORAL | 0 refills | Status: DC
Start: 2019-08-03 — End: 2019-08-10

## 2019-08-03 NOTE — Patient Instructions (Signed)
Thank you for completing your Asthma and Subsequent Annual Medicare Visit   with [place] today.     The purpose of this visits was to:     Screen for disease   Assess risk of future medical problems   Help develop a healthy lifestyle   Update vaccines   Get to know your doctor in case of an illness    Patient Care Team:  [name], MD as PCP - Allegra Grana, MD  Isidoro Donning as [place] - Internal  Tresa Res, LCSW-R as Social Worker  Nordquist, Janett Billow, RN as Registered Nurse  Nada Libman, NP as Consulting Provider (Endocrinology)     Medicare 5 Year Plan    The following items were identified as areas of concern during your screening today:  BMI greater than 25 - This is a risk for Heart Attack, Stroke, High Blood Pressure, Diabetes, High Cholesterol and other complications.       The Health Maintenance table below identifies screening tests and immunizations recommended by your health care team:  Health Maintenance: These screening recommendations are based on USPSTF, [employer], and [place] state guidelines   Topic Date Due    HIV Screening  Never done    Hepatitis C Screening  Never done    Diabetic Foot Exam  02/07/2019    Diabetic Eye Exam  09/26/2019    DEPRESSION SCREEN YEARLY  10/29/2019    Diabetic Kidney Disease  11/25/2019    Diabetic A1C Monitoring  01/28/2020    Breast Cancer Screening  07/29/2021    Cervical Cancer Screening   11/04/2022    Flu Shot  Completed    COVID-19 Vaccine  Completed     In addition, goals and orders placed to address these recommendations are listed in the "Today's Visit" section.    We wish you the best of health and look forward to seeing you again next year for your Annual Medicare Wellness Visit.     If you have any health care concerns before then, please do not hesitate to contact us.

## 2019-08-03 NOTE — Progress Notes (Signed)
[facility]         SUBJECTIVE    Pt is here to discuss:    Chief Complaint   Patient presents with    Diabetes    Subsequent Annual Medicare Visit         1. DM - Adherent with medications. Does check BGs. Admits to recent dietary indiscretion since her dad passed away- not making meals anymroe, increased snacking esp with candy, carbs, chocolate. Was up to 90g of carbs, doses herself correctly. Denies polyuria, polydipsia or hypoglycemia sx. Denies peripheral paresthesias. Does check feet, thinks she may have worsening ingrown nail R>L  Lab Results   Component Value Date    HA1C 6.4 (H) 07/28/2019    HA1C 6.7 (H) 11/25/2018    HA1C 6.8 (H) 04/20/2018    MALBR <1.20 11/25/2018    CREAT 0.81 04/14/2019    LDLC 84 11/25/2018         2. Grief - hasn't been cooking her meals, still thinking about him and the moment they withdrew ventilatory support and he passed away. He was trying to say something, and she describes she perseverates on what that was. Worried he wasn't ready and that she may have made the wrong decision to withdraw care. Working with therapist on this.  Tolerating medications; previous belching with start of seroquel resolved now    3. Back pain - attempted consult with [facility]; was seen once and requested to have records transferred which she did. Has not seen them since. Describes her back pain as "horrible"    4. Hidradenitis- on abx by derm now x68mo(?doxycycline)      PMH / Family Hx / Social Hx  Patient's medications, allergies, problem list, past medical, social histories were reviewed and notable for:       Current Outpatient Medications   Medication Sig Note    dicyclomine (BENTYL) 10 MG capsule Take 1 capsule (10 mg total) by mouth 4 times daily (before meals and nightly) TAKE 1 CAPSULE BY MOUTH FOUR TIMES DAILY BEFORE MEALS AND NIGHTLY     famotidine (PEPCID) 20 MG tablet Take 1 tablet (20 mg total) by mouth nightly as needed for Heartburn     doxycycline hyclate  (VIBRA-TABS) 100 MG tablet TAKE 1 TABLET BY MOUTH TWO TIMES DAILY FOR 30 DAYS     pantoprazole (PROTONIX) 40 MG EC tablet Take 1 tablet (40 mg total) by mouth daily Swallow whole. Do not crush, break, or chew.     dulaglutide (TRULICITY) 02.58MNI/7.7OEpen Inject 0.5 mLs (0.75 mg total) into the skin every 7 days     busPIRone (BUSPAR) 30 MG tablet Take 1 tablet (30 mg total) by mouth 2 times daily     cetirizine (ZYRTEC) 10 MG tablet Take 1 tablet (10 mg total) by mouth daily     fluticasone (FLOVENT HFA) 220 MCG/ACT inhaler Inhale 1 puff into the lungs daily Shake well before each use.     lamoTRIgine (LAMICTAL) 150 MG tablet TAKE 1 TABLET BY MOUTH EVERY DAY     topiramate (TOPAMAX) 100 MG tablet Take 1 tablet (100 mg total) by mouth 2 times daily     insulin lispro 100 UNIT/ML injection vial Inject as directed up to MDD 100 units     QUEtiapine (SEROQUEL XR) 50 MG 24 hr tablet Take 1 tablet (50 mg total) by mouth nightly Swallow whole. Do not chew, crush, or break.     DULoxetine (CYMBALTA) 30 MG DR  capsule TAKE 1 CAPSULE BY MOUTH TWO TIMES DAILY     ascorbic acid (VITAMIN C) 100 MG tablet Take 5 tablets (500 mg total) by mouth daily     fluticasone (FLONASE) 50 MCG/ACT nasal spray SPRAY 1 SPRAY INTO EACH NOSTRIL ONE TIME DAILY     atorvastatin (LIPITOR) 40 MG tablet Take 1 tablet (40 mg total) by mouth daily (with dinner)     metoprolol (TOPROL-XL) 25 MG 24 hr tablet Take by mouth daily    06/10/2016: Received from: External Pharmacy    midodrine (PROAMATINE) 10 MG tablet Take 1 tablet (10 mg total) by mouth 3 times daily     traMADol (ULTRAM) 50 MG tablet TAKE 1 TABLET BY MOUTH EVERY 8 HOURS AS NEEDED FOR HEAD PAIN AND ABDOMINAL PAIN. MAXIMUM DAILY DOSE 3 TABLETS PER DAY     Alcohol Swabs (B-D SINGLE USE SWABS REGULAR) PADS USE 1 PAD TOPICALLY TWO TIMES DAILY TO CHECK BLOOD GLUCOSE     benzoyl peroxide 10 % LIQD external liquid Apply topically 2 times daily to the following areas: under arms,  under breast, groin     LIDOCAINE HCL URETHRAL/MUCOSAL 2 % jelly APPLY TOPICALLY THREE TIMES DAILY AS NEEDED FOR PAIN     albuterol HFA (VENTOLIN HFA) 108 (90 Base) MCG/ACT inhaler Shake well before each use.INHALE 1 TO 2 PUFFS BY MOUTH INTO THE LUNGS EVERY 6 HOURS AS NEEDED FOR WHEEZING. SHAKE WELL BEFORE EACH USE.     SUMAtriptan (IMITREX) 50 MG tablet Take 1 tablet (50 mg total) by mouth as needed for Migraine (TAKE AT ONSET OF HEADACHE. MAY REPEAT ONCE IN 2 HOURS) Take at onset of headache. May repeat once in 2 hours.     insulin lispro 100 UNIT/ML injection vial Inject as directed via pump up to Maximum  60  Units/day     clindamycin (CLEOCIN T) 1 % external solution APPLY TOPICALLY TWO TIMES DAILY CONTINUE UNTIL IMPROVEMENT IN CYSTS     doxycycline hyclate (VIBRAMYCIN) 100 MG capsule Take 1 capsule (100 mg total) by mouth daily     clonazePAM (KLONOPIN) 0.5 MG tablet Take 1 tablet (0.5 mg total) by mouth daily as needed Max daily dose: 0.5 mg     insulin syringe-needle U-100 (BD INSULIN SYRINGE U/F) 31G X 5/16" 0.3 ML HALF-UNIT USE THREE TIMES DAILY AS INSTRUCTED     diphenoxylate-atropine (LOMOTIL) 2.5-0.025 MG per tablet Take 1-2 tablets by mouth 4 times daily as needed for Diarrhea Max daily dose: 8 tablets Code D     blood pressure monitor, automatic with arm cuff Use as directed to monitor blood pressure     incontinence supply disposable Order Description:  Wear as needed for stool leakage     Insulin Disposable Pump (OMNIPOD DASH 5 PACK) MISC By 1 Device no specified route as needed Change every 2 days = 45 pods for 90 days supply = 9 (5packs)     zinc oxide 20 % ointment Apply topically as needed for Dry Skin     clotrimazole (LOTRIMIN) 1 % cream Apply topically 2 times daily     Continuous Blood Gluc Sensor (DEXCOM G6 SENSOR) MISC By 1 Device no specified route as needed     Continuous Blood Gluc Transmit (DEXCOM G6 TRANSMITTER) MISC By 1 Device no specified route as needed      Continuous Blood Gluc Receiver (DEXCOM G6 RECEIVER) DEVI By 1 Device no specified route as needed     famciclovir (FAMVIR) 500 MG tablet Take  1 tablet (500 mg total) by mouth 3 times daily as needed     glucagon (BAQSIMI) 3 MG/DOSE nasal powder Inhale one dose (3 mg) into one nostril once as needed for low blood sugar. If no response in 15 min, inhale second dose from new device     promethazine (PHENERGAN) 25 MG tablet Take 1 tablet (25 mg total) by mouth every 4-6 hours as needed     Continuous Blood Gluc Receiver (DEXCOM G6 RECEIVER) DEVI By 1 Device no specified route as needed     blood glucose test strip Test  8 times a day.   Brand name of strips Contour NEXT test strips for linked omnipod pump     lancets (BAYER MICROLET) Use   8  times per day as instructed for blood glucose testing.     azelastine (OPTIVAR) 0.05 % ophthalmic solution Place 1 drop into both eyes 2 times daily     SUMAtriptan refill (IMITREX STATDOSE) 6 MG/0.5ML injection INJECT 0.5ML UNDER THE SKIN ONCE AS NEEDED FOR MIGRAINE. MAY REPEAT DOSE ONCE AFTER 1 HOUR     glucagon (GLUCAGEN) 1 MG injection kit Inject 1 mg into the skin once as needed for Low blood sugar   Discard any unused portion.     SUMAVEL DOSEPRO 6 MG/0.5ML needle-free injection INJECT 0.5MLS (6MG TOTAL) UNDER THE SKIN AS NEEDED FOR MIGRAINE MAY REPEAT ONCE AFTER 1 HOUR IF NEEDED 12/16/2016: Received from: External Pharmacy    Non-System Medication The above patient is followed in our clinic and cannot resume work permanently.           OBJECTIVE  Vitals:    08/03/19 1650   BP: 127/68   BP Location: Right arm   Patient Position: Sitting   Pulse: 67   Temp: 36.7 C (98 F)   SpO2: 99%   Weight: 118.8 kg (262 lb)   Height: 1.727 m (5' 7.99")     Body mass index is 39.85 kg/m.      General: well-appearing Caucasian female, pleasant & conversant, in NAD  Feet: No evidence of injury, infection or ischemia b/l. Mild ingrown changes at R>L side. DP and TP pulses 2+ b/l.  Sensation to monofilament testing is  intact.  Psych: AAOx3, normal affect and mood. Insight and judgement intact.           ASSESSMENT & PLAN  1. Controlled type 2 diabetes mellitus  Controlled; continue current management with Endo. Encouraged resumption of making 1 meal daily for herself (ex lunch). Updated labs requestedin 15mo   Encouraged cutting nail straight across to prevent ingrowing.     - Hemoglobin A1c; Future  - Hepatitis C antibody; Future  - Lipid Panel (Reflex to Direct  LDL if Triglycerides more than 400); Future  - Microalbumin, Urine, Random; Future    2. Bereavement  Spent majority of visit providing empathic listening for pt. Reassurance re: father's complex medical conditions and recurrent hospitalizations made recovery unlikely. Reassurance that I felt withdrawal of care was medically appropriate and in line with his wishes. Declines medication adjustments; encouraged to continue working with therapist. F/u prn.     3. Lumbar facet arthropathy  Encouraged pt call [facility] to schedule upcoming appt now that records are there.     4.  Preventative health care  AWV completed today      Follow-up: 375m    JiJohny DrillingMD  [facility]  08/03/2019  5:01 PM  ______________________

## 2019-08-03 NOTE — Progress Notes (Signed)
Visit performed as:             Office Visit, met with patient in person    Today we reviewed and updated Kaylee Harris smoking status, activities of daily living, depression screen, fall risk, medications and allergies.   I have counseled the patient in the above areas.     Subjective:     Chief Complaint: Kaylee Harris is a 44 y.o. female here for a/an Asthma and Subsequent Annual Medicare Visit    In general, Kaylee Harris rates their overall health as:  fair      Patient Care Team:  [name], MD as PCP - General  Trilby Leaver, MD  Isidoro Donning as [place] - Internal  Tresa Res, LCSW-R as Social Worker  Nordquist, Janett Billow, RN as Registered Nurse  Nada Libman, NP as Consulting Provider (Endocrinology)  Grayling Congress, MD as Consulting Provider (Physical Medicine and Rehabilitation)     Current Outpatient Medications on File Prior to Visit   Medication Sig Dispense Refill    famotidine (PEPCID) 20 MG tablet Take 1 tablet (20 mg total) by mouth nightly as needed for Heartburn 90 tablet 1    doxycycline hyclate (VIBRA-TABS) 100 MG tablet TAKE 1 TABLET BY MOUTH TWO TIMES DAILY FOR 30 DAYS 60 tablet 0    pantoprazole (PROTONIX) 40 MG EC tablet Take 1 tablet (40 mg total) by mouth daily Swallow whole. Do not crush, break, or chew. 90 tablet 3    dulaglutide (TRULICITY) 7.47 FT/9.5ZX pen Inject 0.5 mLs (0.75 mg total) into the skin every 7 days 2 mL 5    busPIRone (BUSPAR) 30 MG tablet Take 1 tablet (30 mg total) by mouth 2 times daily 180 tablet 3    cetirizine (ZYRTEC) 10 MG tablet Take 1 tablet (10 mg total) by mouth daily 90 tablet 1    fluticasone (FLOVENT HFA) 220 MCG/ACT inhaler Inhale 1 puff into the lungs daily Shake well before each use. 1 each 2    lamoTRIgine (LAMICTAL) 150 MG tablet TAKE 1 TABLET BY MOUTH EVERY DAY 90 tablet 2    topiramate (TOPAMAX) 100 MG tablet Take 1 tablet (100 mg total) by mouth 2 times daily 180 tablet 3    insulin lispro 100  UNIT/ML injection vial Inject as directed up to MDD 100 units 40 mL 11    QUEtiapine (SEROQUEL XR) 50 MG 24 hr tablet Take 1 tablet (50 mg total) by mouth nightly Swallow whole. Do not chew, crush, or break. 90 tablet 3    DULoxetine (CYMBALTA) 30 MG DR capsule TAKE 1 CAPSULE BY MOUTH TWO TIMES DAILY 180 capsule 3    ascorbic acid (VITAMIN C) 100 MG tablet Take 5 tablets (500 mg total) by mouth daily 100 tablet 3    fluticasone (FLONASE) 50 MCG/ACT nasal spray SPRAY 1 SPRAY INTO EACH NOSTRIL ONE TIME DAILY 48 g 3    atorvastatin (LIPITOR) 40 MG tablet Take 1 tablet (40 mg total) by mouth daily (with dinner) 90 tablet 3    metoprolol (TOPROL-XL) 25 MG 24 hr tablet Take by mouth daily         midodrine (PROAMATINE) 10 MG tablet Take 1 tablet (10 mg total) by mouth 3 times daily 90 tablet 0    traMADol (ULTRAM) 50 MG tablet TAKE 1 TABLET BY MOUTH EVERY 8 HOURS AS NEEDED FOR HEAD PAIN AND ABDOMINAL PAIN. MAXIMUM DAILY DOSE 3 TABLETS PER DAY 42  tablet 0    Alcohol Swabs (B-D SINGLE USE SWABS REGULAR) PADS USE 1 PAD TOPICALLY TWO TIMES DAILY TO CHECK BLOOD GLUCOSE 100 each 1    benzoyl peroxide 10 % LIQD external liquid Apply topically 2 times daily to the following areas: under arms, under breast, groin 227 g 3    LIDOCAINE HCL URETHRAL/MUCOSAL 2 % jelly APPLY TOPICALLY THREE TIMES DAILY AS NEEDED FOR PAIN 30 mL 4    albuterol HFA (VENTOLIN HFA) 108 (90 Base) MCG/ACT inhaler Shake well before each use.INHALE 1 TO 2 PUFFS BY MOUTH INTO THE LUNGS EVERY 6 HOURS AS NEEDED FOR WHEEZING. SHAKE WELL BEFORE EACH USE. 1 each 1    SUMAtriptan (IMITREX) 50 MG tablet Take 1 tablet (50 mg total) by mouth as needed for Migraine (TAKE AT ONSET OF HEADACHE. MAY REPEAT ONCE IN 2 HOURS) Take at onset of headache. May repeat once in 2 hours. 9 tablet 5    insulin lispro 100 UNIT/ML injection vial Inject as directed via pump up to Maximum  60  Units/day 60 mL 3    clindamycin (CLEOCIN T) 1 % external solution APPLY TOPICALLY  TWO TIMES DAILY CONTINUE UNTIL IMPROVEMENT IN CYSTS 60 mL 1    doxycycline hyclate (VIBRAMYCIN) 100 MG capsule Take 1 capsule (100 mg total) by mouth daily 30 capsule 9    clonazePAM (KLONOPIN) 0.5 MG tablet Take 1 tablet (0.5 mg total) by mouth daily as needed Max daily dose: 0.5 mg 28 tablet 0    insulin syringe-needle U-100 (BD INSULIN SYRINGE U/F) 31G X 5/16" 0.3 ML HALF-UNIT USE THREE TIMES DAILY AS INSTRUCTED 100 each 10    diphenoxylate-atropine (LOMOTIL) 2.5-0.025 MG per tablet Take 1-2 tablets by mouth 4 times daily as needed for Diarrhea Max daily dose: 8 tablets Code D 240 tablet 0    blood pressure monitor, automatic with arm cuff Use as directed to monitor blood pressure 1 each 0    incontinence supply disposable Order Description:  Wear as needed for stool leakage 100 each 3    Insulin Disposable Pump (OMNIPOD DASH 5 PACK) MISC By 1 Device no specified route as needed Change every 2 days = 45 pods for 90 days supply = 9 (5packs) 45 each 3    zinc oxide 20 % ointment Apply topically as needed for Dry Skin 30 g 3    clotrimazole (LOTRIMIN) 1 % cream Apply topically 2 times daily 15 g 3    Continuous Blood Gluc Sensor (DEXCOM G6 SENSOR) MISC By 1 Device no specified route as needed 3 each 11    Continuous Blood Gluc Transmit (DEXCOM G6 TRANSMITTER) MISC By 1 Device no specified route as needed 1 each 3    Continuous Blood Gluc Receiver (DEXCOM G6 RECEIVER) DEVI By 1 Device no specified route as needed 1 Device 0    famciclovir (FAMVIR) 500 MG tablet Take 1 tablet (500 mg total) by mouth 3 times daily as needed 30 tablet 5    glucagon (BAQSIMI) 3 MG/DOSE nasal powder Inhale one dose (3 mg) into one nostril once as needed for low blood sugar. If no response in 15 min, inhale second dose from new device 2 each 5    promethazine (PHENERGAN) 25 MG tablet Take 1 tablet (25 mg total) by mouth every 4-6 hours as needed 30 tablet 3    Continuous Blood Gluc Receiver (DEXCOM G6 RECEIVER) DEVI By 1  Device no specified route as needed 1 Device 0  blood glucose test strip Test  8 times a day.   Brand name of strips Contour NEXT test strips for linked omnipod pump 250 strip 11    lancets (BAYER MICROLET) Use   8  times per day as instructed for blood glucose testing. 250 each 11    azelastine (OPTIVAR) 0.05 % ophthalmic solution Place 1 drop into both eyes 2 times daily      SUMAtriptan refill (IMITREX STATDOSE) 6 MG/0.5ML injection INJECT 0.5ML UNDER THE SKIN ONCE AS NEEDED FOR MIGRAINE. MAY REPEAT DOSE ONCE AFTER 1 HOUR 2 Syringe 5    glucagon (GLUCAGEN) 1 MG injection kit Inject 1 mg into the skin once as needed for Low blood sugar   Discard any unused portion. 1 each 5    SUMAVEL DOSEPRO 6 MG/0.5ML needle-free injection INJECT 0.5MLS (6MG TOTAL) UNDER THE SKIN AS NEEDED FOR MIGRAINE MAY REPEAT ONCE AFTER 1 HOUR IF NEEDED  1    Non-System Medication The above patient is followed in our clinic and cannot resume work permanently. 1 each 1     No current facility-administered medications on file prior to visit.     Allergies   Allergen Reactions    Morphine Itching    Trazodone Anaphylaxis    Seasonal Allergies Itching    Lidocaine Rash     Patches caused a rash    Nsaids Other (See Comments)     Bleeding/epistaxis    Other [Other] Other (See Comments)     Derma Bond(skin glue) pruritis/redness and c/o respiratory distress.   Received name: Other     Patient Active Problem List    Diagnosis Date Noted    At risk for abuse of opiates 06/29/2014     Priority: High     DO NOT GIVE IV NARCOTICS OR RX      History of repeated overdose 06/29/2014     Priority: High     DO NOT GIVE IV NARCOTICS      Borderline personality disorder 06/29/2014     Priority: High    Fibromyalgia 03/24/2013     Priority: High     Cymbalta -> lyrica 01/22/2017       Syncope and collapse- recurrent, working diagnosis vascular vs psychogenic. Non-arrythmic per Dr Lovena Le 10/2014 03/24/2013     Priority: High     Hospitalized  in 09/2011 with angina, had normal cardiac catheterization then developed syncopal episodes and hypotension following this hospitalization.   24h holter 10/2011 which showed brief mobitz 1, +tilt table testing, treated with Florinef, compression stockings, fluids/salt liberalization.   Seemed to have improvement for ~1 yr, then recurrent syncope in 10/2012. Resolved with use of compression stockings and avoidance of gluten  Loop monitor placed 10/1 by [facility]; last seen 5/11 with normal sinus rhythm even during episodes. Transitioning off ranexa as of 09/13/13 to decrease medication burden  Nuclear stress test 7/27 negative. Transitioned from theophylline to midodrine 7/7    05/2014: taken off all meds due to escalating episodes and no cardiac etiology. Felt to be psychogenic per discussion with Dr Kerin Ransom and [name] (psych). Given protective helmet and will focus more on psychiatric treatment/care. Both pt and wife are not in agreement with this diagnosis    07/2014: seen by neurologist and requested second opinion from different cardiologist and EEG. Underwent one time EEG (normal 3/24), if neg will pursue extended monitoring    09/05/14 referred to Dr Lovena Le for second opinion in cardiology  Repeat tilt tabel 09/28/14 +  and suggestive of POTS although per Dr Tanna Furry notes "hemodynamics not c/w POTS"  10/2014: started on atenolol and midodrine, cogentin d/c-ed  02/07/2015: repeat evaluation by [name], [facility] cardiologist; recommended consideration of prolonged inpatient monitoring (such as [facility] inpatient neuro with camera monitoring, EEG and HR monitoring).  May need to consider migraine variant as a cause of syncope.  Cardiologist's comments on meds: better after stopping Cogentin, now on Abilify for bipolar, tolerating atenolol, increase midodrine to 3x/day, try salt tablets, support hose daily, continue abd binder.)  She does not have cardiac or arrhythmia problems.  Needs to continue intensive treatment with  psychiatry and neurology.  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 sent to [facility] -> [facility] program (dismissed 06/2014 due to frequent abscenses)  6/16: cogentin and latuda d/c-ed , transitioned to abilify  06/2015: abilify decreased due to uncontrolled DM      DM (diabetes mellitus), type 2, uncontrolled 03/09/2009     Priority: High     Undergoing Lantus titration; previously intolerant of glipizide and metformin  10/14/2014 mealtime insulin  06/2015 trial of splitting lantus (no change), increasing lantus and humalog  07/2015 transitioned to GLP1 agonist and VGO      Migraine with aura 03/09/2009     Priority: Medium    Hyperlipemia 03/09/2009     Priority: Medium             IUD (intrauterine device) in place 03/24/2013     Priority: Low     10/29/11  11/03/2017       Asthma 03/09/2009     Priority: Low     Created by Conversion        Abnormal CXR 08/07/2019     12/20- related to bony island seen on CT?  Plan to repeat imaging in 43mo     Hidradenitis 08/03/2019     Put on doxycycline      First degree heart block 12/18/2017     PR 226 8/19      Lumbar facet arthropathy 07/03/2017     Moderate lumbar DJD on xray 12/18  MRI shows mild disc dessication and facet joint swelling at L4/5, no central stenosis or herniation  REcommended exercise  05/20/17 s/p b/l L4/5 facet joint injection    Pending median branch block protocol with plan towards pursuing radiofrequency ablation      Labial abscess 03/28/2017    Right hip pain 02/25/2017     S/p TB injection 02/2017      Morbid obesity with BMI of 40.0-44.9, adult  10/17/2016    Hematuria 10/17/2016     Likely 2/2 bladder catheter trauma  Felt to have ?bladder mass on first cystoscopy. Repeat was neg for mass      GERD (gastroesophageal reflux disease) 090/37/9558   TMJ click, left 031/67/4255   Long term (current) use of insulin, Dermal Adhesed V-Go Insulin Delivery Device 10/04/2016    POTS (postural orthostatic tachycardia syndrome) 01/15/2016    Anxiety 01/14/2016    Bipolar 1 disorder 05/03/2015  Traumatic hemorrhage of right cerebrum 05/03/2015    Fatty liver disease, nonalcoholic 78/46/9629     Hepatomegaly (19cm) on 09/25/14 [place], normal LFTs      No diabetic retinopathy in both eyes 04/05/2014    Meralgia paresthetica of left side 10/06/2013    Mixed obsessional thoughts and acts 10/04/2013    Carpal tunnel syndrome 10/04/2013     EMG 09/2013 neg for denervation, R>L carpal tunnel. Recommended splinting      IBS (irritable bowel syndrome) 05/31/2013     Seen in ED 5/21 and 22 with RUQ pain, [place] neg for gallstones, labs normal. US showed hepatomegaly with steatosis  Chronic diarrhea and pain since. 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 Lower abdomen with cellulitis changes, wound probed and purulent material expressed.  Had PICC line for "multiple infiltrations" of what? D/c-ed home on 10d of Augmentin 9/11.      Anginal pain     Anxiety     Arthritis     Asthma     Depression     bipolar    Diabetes mellitus     Previously on SU and metformin, now diet controlled    Diverticulitis 09/2010    Dysfunctional uterine bleeding      Fibromyalgia     GERD (gastroesophageal reflux disease)     GERD (gastroesophageal reflux disease) 10/04/2016    High blood pressure     Hyperlipidemia     Liver disease     fatty liver     Long term (current) use of insulin, Dermal Adhesed V-Go Insulin Delivery Device 10/04/2016    Migraine     Neuromuscular disorder     POTS (postural orthostatic tachycardia syndrome)     Sebaceous cyst of breast     right axilla    TMJ click, left 09/05/8411    Varicella      Past Surgical History:   Procedure Laterality Date    APPENDECTOMY  2016    arthroscopic shoulder surgery Right 2010    Related to lifting    CARDIAC CATHETERIZATION  09/2011    negative    COLON SURGERY      COSMETIC SURGERY Bilateral 12/12/2017    B/L internal maxillary arteries embolized due to recurrent epistaxis, Dr Linard Millers    DILATION AND CURETTAGE OF UTERUS  2005, 2007    x2 for menorraghia    LEFT COLECTOMY  01/03/14    Dr Tresa Res    loop recorder  Feb 03 2014    loop recorder removal      PR COLONOSCOPY THRU COLOTOMY N/A 02/16/2016    Procedure: COLONOSCOPY;  Surgeon: Kelly Splinter, MD;  Location: [facility];  Service: GI    PR CYSTOURETHROSCOPY,BIOPSY N/A 10/17/2016    Procedure: CYSTOSCOPY BLADDER ;  Surgeon: Ed Blalock, MD;  Location: [facility];  Service: Urology    PR EDG TRANSORAL BIOPSY SINGLE/MULTIPLE N/A 10/29/2016    Procedure: EGD;  Surgeon: Kelly Splinter, MD;  Location: [facility];  Service: GI  TONSILLECTOMY       Family History   Problem Relation Age of Onset    Hypertension Father     Diabetes Father     Kidney Disease Father     Hyperlipidemia Father     Heart attack Father 32    Other Father         PVD    Heart Disease Father     Hypertension Mother     Hyperlipidemia Mother     Heart attack Mother 58    Diabetes Mother     Eczema Mother     Psoriasis Mother     Heart Disease Mother     Hypertension Brother     Heart Disease Brother 11         prinzmetal's angina    Heart Disease Sister         currently having work up    [place] Sister     Hypertension Sister     Breast cancer Maternal Grandmother     Stroke Other     Thyroid disease Sister     Thyroid cancer Sister     Anesthesia problems Neg Hx      Social History     Socioeconomic History    Marital status: Married     Spouse name: Not on file    Number of children: Not on file    Years of education: Not on file    Highest education level: Not on file   Occupational History    Not on file   Tobacco Use    Smoking status: Former Smoker     Packs/day: 0.50     Years: 3.00     Pack years: 1.50     Types: Cigarettes    Smokeless tobacco: Never Used    Tobacco comment: quit age late 65s   Substance and Sexual Activity    Alcohol use: Yes     Alcohol/week: 0.0 standard drinks     Comment: 0-1 drink/ month    Drug use: No    Sexual activity: Yes     Partners: Female     Birth control/protection: I.U.D.   Social History Narrative    ** Merged History Encounter **         Married to [employer], recently relocated back to [place] from [place] due to family stressors. 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        Objective:     Vital Signs: BP 127/68 (BP Location: Right arm, Patient Position: Sitting)    Pulse 67    Temp 36.7 C (98 F)    Ht 1.727 m (5' 7.99")    Wt 118.8 kg (262 lb)    SpO2 99%    BMI 39.85 kg/m    BMI: Body mass index is 39.85 kg/m.    Vision Screening Results (Welcome visit only):  No exam data present    Depression Screening Results:  Recent Review Flowsheet Data     PHQ Scores 08/03/2019 10/29/2018 10/20/2017 09/30/2017 08/14/2017 06/09/2017 05/22/2017    PSQ2 Q1 - Interest/Pleasure N N N - - - -    PSQ2 Q2 - Down, Depressed, Hopeless - N N - - - -    PHQ Q9 - Better Off Dead - - - 0 0 0 0    PHQ Calculated Score - - - _0 Opioid Use/DAST- 10 Screening  Results:   How many times in the past year have you used an illegal drug  or used a prescription medication for nonmedical reasons?: 0 (08/03/2019  4:49 PM)    Activities of Daily Living/Functional Screening Results:  Is the person deaf or does he/she have serious difficulty hearing?: N  Is this person blind or does he/she have serious difficulty seeing even when wearing glasses?: N  *Vision Status: Visual aid   Does this person have serious difficulty walking or climbing stairs?: Y  *Walks in Home: Independent  *Climbing Stairs: Independent (difficult during fibromyalgia flares)  Does this person have difficulty dressing or bathing?: N (uses shower chair in the shower)  *Shopping: Needs Assistance  *House Keeping: Dependent  *Managing Own Medications: Independent  *Handling Finances: Independent  If you need help, who helps you?: wife drives  Difficulty doing errands due to a physicial, mental or emotional condition: Yes  Difficulty remembering or making decisions due to a physicial, mental or emotional condition: No      Fall Risk Screening Results:  Have you fallen in the last year?: No  Did you sustain an injury which required medical attention?: No  Do you feel you are at risk for falling?: No  Do you feel your risk of falling is: : Moderate  Would you like to learn more about how to reduce your risk of falling?: No      Assessment and Plan:     Cognitive Function:  Recall of recent and remote events appears:  Normal      Advanced Care Planning:  was discussed and the paperwork can be found in the scanned media section     The following health maintenance plan was reviewed with the patient:    Health Maintenance Topics with due status: Overdue       Topic Date Due    HIV Screening USPSTF/Elida Never done    Hepatitis C Screening Never done    Diabetic Foot Exam ADA 02/07/2019     Health Maintenance Topics with due status: Not Due       Topic Last Completion Date    Diabetic Eye Exam ADA 09/25/2017    Cervical Cancer Screening 11/03/2017    DEPRESSION SCREEN YEARLY 10/29/2018    Diabetic  Nephropathy Screening no ACE/ARB 11/25/2018    Diabetic A1C Monitoring ADA 07/28/2019    Breast Cancer Screening Other 07/30/2019     Health Maintenance Topics with due status: Completed       Topic Last Completion Date    IMM-INFLUENZA 03/09/2019    COVID-19 Vaccine 07/30/2019     This health maintenance schedule, identified risks, a list of orders placed today and patient goals have been provided to Kaylee Harris in the after visit summary.     Plan for any concerns identified during screening or risk assessments:  DM foot exam completed today  Hep C updated    [name], MD  [facility]  08/05/2019  12:47 PM

## 2019-08-03 NOTE — Telephone Encounter (Signed)
Last office visit:   05/05/2019  Patients upcoming appointments:  Future Appointments   Date Time Provider [place]   08/03/2019  4:40 PM [name], MD [facility] None   08/06/2019  2:00 PM Tresa Res, LCSW-R [facility] None   09/08/2019  2:00 PM Nordquist, Janett Billow, RN [facility] None   10/27/2019  2:00 PM Marjo Bicker, RD [place] None     Recent Lab results:  GENERAL CHEMISTRY   Recent Labs     04/14/19  1903 04/13/19  1623 03/05/19  1620   NA 139 137 139   K 4.2 4.7 4.8   CL 105 104 104   CO2 _0 GAP _1 UN _2 CREAT 0.81 0.98* 0.94   GFRC 88 70 74   GFRB 101 81 85   GLU 94 95 84   CA 9.7 9.8 9.5      LIPID PROFILE   Recent Labs     11/25/18  1425   CHOL 146   TRIG 129   HDL 36*   LDLC 84      LIVER PROFILE   Recent Labs     04/14/19  1903 04/13/19  1623 11/25/18  1425   ALT _3 AST _4 ALK 103 115* 107*   TB 0.3 0.5 0.4      DIABETES THYROID   Recent Labs     07/28/19  1500 11/25/18  1425   HA1C 6.4* 6.7*    No value within the past 365 days      Pending/Orders Labs:  Lab Frequency Next Occurrence        Recent Lab Values 04/14/2019 01/04/2018 12/17/2017 10/17/2017 10/17/2017 03/31/2017 10/29/2016   EXP DATE 2019-08-04 9/20 02/02/2018 11-03-18 08-04-18 04/04/18 04/04/2017   [facility] - - - - - - -

## 2019-08-04 ENCOUNTER — Telehealth: Payer: Self-pay

## 2019-08-04 NOTE — Telephone Encounter (Signed)
I called the patient and left a message asking them to contact our office. Please schedule the patient for a follow up from their in office appointment. Please see the message below.     Please schedule in 1 year for your Subsequent Annual    Medicare Wellness Visit.     FU 1mo re: Grief

## 2019-08-06 ENCOUNTER — Ambulatory Visit: Payer: Medicare (Managed Care) | Attending: Psychiatry

## 2019-08-06 DIAGNOSIS — Z7189 Other specified counseling: Secondary | ICD-10-CM | POA: Insufficient documentation

## 2019-08-06 DIAGNOSIS — F331 Major depressive disorder, recurrent, moderate: Secondary | ICD-10-CM | POA: Insufficient documentation

## 2019-08-06 DIAGNOSIS — F603 Borderline personality disorder: Secondary | ICD-10-CM | POA: Insufficient documentation

## 2019-08-06 DIAGNOSIS — F4312 Post-traumatic stress disorder, chronic: Secondary | ICD-10-CM | POA: Insufficient documentation

## 2019-08-06 DIAGNOSIS — F422 Mixed obsessional thoughts and acts: Secondary | ICD-10-CM | POA: Insufficient documentation

## 2019-08-06 NOTE — Progress Notes (Signed)
Behavioral Health Progress Note   Patient encounter was performed via Video     Patient located at: home    Provider located at: home office    Other participants in this encounter and roles:  NA    Consent was previously obtained from the patient to complete this service via telehealth; including the potential for financial liability.  Length of Session: 45 minutes    Contact Type:  Location: Telemedicine Covid-19    Other: Video     Problem(s)/Goals Addressed from Treatment Plan:    Problem 1:   Treatment Problem #1 07/21/2019   Patient Identified Problem Bereavement       Goal for this problem:    Treatment Goal #1 07/21/2019   Patient Identified Goal Process grief stages       Progress towards this goal: Patient reports family system issues.    Mental Status Exam:  APPEARANCE: Casual  ATTITUDE TOWARD INTERVIEWER: Cooperative  MOTOR ACTIVITY: WNL (within normal limits)  EYE CONTACT: Direct  SPEECH: Normal rate and tone  AFFECT: Full Range  MOOD: Anxious and Depressed  THOUGHT PROCESS: Normal  THOUGHT CONTENT: No unusual themes  PERCEPTION: No evidence of hallucinations  CURRENT SUICIDAL IDEATION: patient denies  CURRENT HOMICIDAL IDEATION: Patient denies  ORIENTATION: Alert and Oriented X 3.  CONCENTRATION: Good  MEMORY:   Recent: intact   Remote: intact  COGNITIVE FUNCTION: Average intelligence  JUDGMENT: Intact  IMPULSE CONTROL: Good and Fair  INSIGHT: Good and Fair    Risk Assessment:  ASSESSMENT OF RISK FOR SUICIDAL BEHAVIOR  Changes in risk for suicide from baseline Formulation of Risk and/or previous intake, including newly identified risk, if any: none  Violence risk was assessed and No Change noted from baseline formulation of risk and/or previous assessment.    Session Content::  Patient reports family system issues. Patient talked about relationship issues between her brother and sister. She said that she is being put in the middle again and she is tired of this. Writer and patient discussed ways in  which patient can set a boundary with both of them. Patient told Probation officer that [name] and her MIL want her to go to church with her FIL and she doesn't want to do this. She said that she is ok driving separately. Writer reinforced patient maintaining this boundary. Patient said that she is still not sleeping well, nightmares - still wanting to know what dad said, him in the hospital. Writer provided empathetic support. Patient and Probation officer discussed ways patient can take care of herself - not watching firefighter show, hospital shows.      Visit Diagnosis:      ICD-10-CM ICD-9-CM   1. Chronic post-traumatic stress disorder (PTSD)  F43.12 309.81   2. Mixed obsessional thoughts and acts  F42.2 300.3   3. Bereavement counseling  Z71.89 V65.40   4. MDD (major depressive disorder), recurrent episode, moderate  F33.1 296.32       Interventions:  Bereavement Therapy  Supportive Psychotherapy  Taught/practiced communication skills (specify skills used):  Setting boundaries    Current Treatment Plan   Created/Updated On 07/21/2019   Next Treatment Plan Due 01/20/2020     Consent was previously obtained from the patient to complete this Video consult; including the potential for financial liability.    Time spent on the  Video with patient: 40+ minutes    Length of time compiling the report:15 minutes    Plan:  Psychotherapy continues as described in care plan; plan remains the  same.    NEXT APPT: 08/19/19.      Tresa Res, LCSW-R

## 2019-08-06 NOTE — Telephone Encounter (Signed)
2nd attempt to reach the patient.

## 2019-08-07 DIAGNOSIS — R9389 Abnormal findings on diagnostic imaging of other specified body structures: Secondary | ICD-10-CM | POA: Insufficient documentation

## 2019-08-09 NOTE — Telephone Encounter (Signed)
3rd attempt to reach the patient. I have sent an unable to reach mychart message.

## 2019-08-10 ENCOUNTER — Other Ambulatory Visit: Payer: Self-pay | Admitting: Psychiatry

## 2019-08-10 ENCOUNTER — Other Ambulatory Visit: Payer: Self-pay | Admitting: Family Medicine

## 2019-08-10 DIAGNOSIS — E1165 Type 2 diabetes mellitus with hyperglycemia: Secondary | ICD-10-CM

## 2019-08-10 MED ORDER — DEXCOM G6 SENSOR MISC *A*
3 refills | Status: DC | PRN
Start: 2019-08-10 — End: 2019-09-08

## 2019-08-10 NOTE — Telephone Encounter (Signed)
Pharmacy changed for re order. Can we send short supply as patient has not yet received this in the mail and has been out for a week.

## 2019-08-10 NOTE — Telephone Encounter (Signed)
mychart sent to verify request with pt

## 2019-08-10 NOTE — Telephone Encounter (Signed)
Patient returned call and has been scheduled.

## 2019-08-10 NOTE — Telephone Encounter (Signed)
Refilled 08/03/19 129 caps         Last office visit:   08/03/2019  Patients upcoming appointments:  Future Appointments   Date Time Provider [place]   08/19/2019  2:00 PM Tresa Res, LCSW-R [facility] None   09/08/2019  2:00 PM Nordquist, Janett Billow, RN [facility] None   10/27/2019  2:00 PM Marjo Bicker, RD [facility] None     Recent Lab results:  GENERAL CHEMISTRY   Recent Labs     04/14/19  1903 04/13/19  1623 03/05/19  1620   NA 139 137 139   K 4.2 4.7 4.8   CL 105 104 104   CO2 _0 GAP _1 UN _2 CREAT 0.81 0.98* 0.94   GFRC 88 70 74   GFRB 101 81 85   GLU 94 95 84   CA 9.7 9.8 9.5      LIPID PROFILE   Recent Labs     11/25/18  1425   CHOL 146   TRIG 129   HDL 36*   LDLC 84      LIVER PROFILE   Recent Labs     04/14/19  1903 04/13/19  1623 11/25/18  1425   ALT _3 AST _4 ALK 103 115* 107*   TB 0.3 0.5 0.4      DIABETES THYROID   Recent Labs     07/28/19  1500 11/25/18  1425   HA1C 6.4* 6.7*    No value within the past 365 days      Pending/Orders Labs:  Lab Frequency Next Occurrence   Hemoglobin A1c Once 11/03/2019   Hepatitis C antibody Once 11/03/2019   Lipid Panel (Reflex to Direct  LDL if Triglycerides more than 400) Once 11/03/2019   Microalbumin, Urine, Random Once 11/03/2019        Recent Lab Values 04/14/2019 01/04/2018 12/17/2017 10/17/2017 10/17/2017 03/31/2017 10/29/2016   EXP DATE 2019-08-04 9/20 02/02/2018 11-03-18 08-04-18 04/04/18 04/04/2017   [facility] - - - - - - -

## 2019-08-11 MED ORDER — DICYCLOMINE HCL 10 MG PO CAPS *I*
10.0000 mg | ORAL_CAPSULE | Freq: Four times a day (QID) | ORAL | 0 refills | Status: DC
Start: 2019-08-11 — End: 2019-10-03

## 2019-08-12 ENCOUNTER — Telehealth: Payer: Self-pay

## 2019-08-13 NOTE — Progress Notes (Signed)
The writer went to Kaylee Harris's  home to see how Kaylee Harris is doing and to address care plan goals.  Kaylee Harris reports that she is doing well, that she still does not have a aide coming to her home from [place].  The Probation officer found that Kaylee Harris's case Freight forwarder at [employer] is Kaylee Harris and that her spouse has aide services as well through [employer] with Kaylee Harris as her Case Freight forwarder as well.  The writer found that Kaylee Harris has a total of 5 hours a week of approved services and her spouse has 4 hours a week of services.  The Probation officer then advised that Kaylee Harris and her spouse talk to her case manager at [employer] together and navigate ways to get the aide services in their home between the total of 9 hours.  Kaylee Harris agreed.  The writer reminded Kaylee Harris to call [employer] to request assistance for food again and gave her the phone #[phone].  The Probation officer then reminded Kaylee Harris of her upcoming appointments [address] [facility] at 2pm with her therapist, Kaylee Harris.  Kaylee Harris thanked the Probation officer and signed the updated crisis plan and care plan.

## 2019-08-19 ENCOUNTER — Ambulatory Visit: Payer: Medicare (Managed Care)

## 2019-08-19 DIAGNOSIS — F422 Mixed obsessional thoughts and acts: Secondary | ICD-10-CM

## 2019-08-19 DIAGNOSIS — F4312 Post-traumatic stress disorder, chronic: Secondary | ICD-10-CM

## 2019-08-19 DIAGNOSIS — Z7189 Other specified counseling: Secondary | ICD-10-CM

## 2019-08-19 NOTE — Progress Notes (Signed)
Behavioral Health Progress Note   Patient encounter was performed via Video     Patient located at: home    Provider located at: home office    Other participants in this encounter and roles:  N/A    Consent was previously obtained from the patient to complete this service via telehealth; including the potential for financial liability.  Length of Session: 45 minutes    Contact Type:  Location: Telemedicine Covid-19    Other: Video     Problem(s)/Goals Addressed from Treatment Plan:    Problem 1:   Treatment Problem #1 07/21/2019   Patient Identified Problem Bereavement       Goal for this problem:    Treatment Goal #1 07/21/2019   Patient Identified Goal Process grief stages       Progress towards this goal: Problem resolving. Comment: Patient reports some mood stability.    Mental Status Exam:  APPEARANCE: Appears stated age, Casual  ATTITUDE TOWARD INTERVIEWER: Cooperative  MOTOR ACTIVITY: WNL (within normal limits)  EYE CONTACT: Direct  SPEECH: Normal rate and tone  AFFECT: Full Range  MOOD: Neutral  THOUGHT PROCESS: Normal  THOUGHT CONTENT: No unusual themes  PERCEPTION: No evidence of hallucinations  CURRENT SUICIDAL IDEATION: patient denies  CURRENT HOMICIDAL IDEATION: Patient denies  ORIENTATION: Alert and Oriented X 3.  CONCENTRATION: Good  MEMORY:   Recent: intact   Remote: intact  COGNITIVE FUNCTION: Average intelligence  JUDGMENT: Intact  IMPULSE CONTROL: Good and Fair  INSIGHT: Good and Fair    Risk Assessment:  ASSESSMENT OF RISK FOR SUICIDAL BEHAVIOR  Changes in risk for suicide from baseline Formulation of Risk and/or previous intake, including newly identified risk, if any: none  Violence risk was assessed and No Change noted from baseline formulation of risk and/or previous assessment.    Session Content:: Patient reports some mood stability. Patient told Probation officer about situational stressors on Easter. She said that it was "fine". Patient said that she and [name] drove themselves to church but it  caused an argument. Patient said that [name] told her that she didn't know why it had to come up again and that maybe they should just take a break if she couldn't be around her father. She said that she was very upset by this. Patient told Probation officer that she hasn't wanted to be touched romantically since her father's death. She said that she just wants to feel better and is tired of not being happy, not sleeping, [name] not being happy with her and thinking she doesn't want to be with her anymore. Writer validated patient's thoughts and feelings. Writer and patient discussed patient's grieving process and pushing people away to try to save herself from future pain. Patient said that she understood that this is ineffective. Patient said that she did set a limit with her brother and sister and feels good about this. Patient told Probation officer that she has been picking fingers more since her father died. Writer and patient discussed habit reduction.    Visit Diagnosis:      ICD-10-CM ICD-9-CM   1. Chronic post-traumatic stress disorder (PTSD)  F43.12 309.81   2. Mixed obsessional thoughts and acts  F42.2 300.3   3. Bereavement counseling  Z71.89 V65.40       Interventions:  Bereavement Therapy  Supportive Psychotherapy  Habit reduction    Current Treatment Plan   Created/Updated On 07/21/2019   Next Treatment Plan Due 01/20/2020     Consent was previously obtained from the patient to  complete this Video consult; including the potential for financial liability.    Time spent on the  Video with patient: 40+ minutes    Length of time compiling the report:15 minutes    Plan:  Psychotherapy continues as described in care plan; plan remains the same.    NEXT APPT: 09/02/19.      Tresa Res, LCSW-R

## 2019-08-20 ENCOUNTER — Other Ambulatory Visit: Payer: Self-pay

## 2019-08-20 DIAGNOSIS — Z9109 Other allergy status, other than to drugs and biological substances: Secondary | ICD-10-CM

## 2019-08-20 MED ORDER — CETIRIZINE HCL 10 MG PO TABS *I*
10.0000 mg | ORAL_TABLET | Freq: Every day | ORAL | 1 refills | Status: DC
Start: 2019-08-20 — End: 2019-11-11

## 2019-08-20 NOTE — Telephone Encounter (Signed)
Last office visit:   08/03/2019  Patients upcoming appointments:  Future Appointments   Date Time Provider [place]   09/02/2019  4:00 PM Tresa Res, LCSW-R [facility] None   09/08/2019  2:00 PM Nordquist, Janett Billow, RN [facility] None   10/27/2019  2:00 PM Marjo Bicker, RD [facility] None   11/09/2019  2:00 PM [name], MD [facility] None   08/03/2020  2:00 PM [name], MD [facility] None       Recent Lab Values 04/14/2019 01/04/2018 12/17/2017 10/17/2017 10/17/2017 03/31/2017 10/29/2016   EXP DATE 2019-08-04 9/20 02/02/2018 11-03-18 08-04-18 04/04/18 04/04/2017   [facility] - - - - - - -       Recent Lab results:  GENERAL CHEMISTRY   Recent Labs     04/14/19  1903 04/13/19  1623 03/05/19  1620   NA 139 137 139   K 4.2 4.7 4.8   CL 105 104 104   CO2 _0 GAP _1 UN _2 CREAT 0.81 0.98* 0.94   GFRC 88 70 74   GFRB 101 81 85   GLU 94 95 84   CA 9.7 9.8 9.5      LIPID PROFILE   Recent Labs     11/25/18  1425   CHOL 146   TRIG 129   HDL 36*   LDLC 84      LIVER PROFILE   Recent Labs     04/14/19  1903 04/13/19  1623 11/25/18  1425   ALT _3 AST _4 ALK 103 115* 107*   TB 0.3 0.5 0.4      DIABETES THYROID   Recent Labs     07/28/19  1500 11/25/18  1425   HA1C 6.4* 6.7*    No value within the past 365 days      Pending/Orders Labs:  Lab Frequency Next Occurrence   Hemoglobin A1c Once 11/03/2019   Hepatitis C antibody Once 11/03/2019   Lipid Panel (Reflex to Direct  LDL if Triglycerides more than 400) Once 11/03/2019   Microalbumin, Urine, Random Once 11/03/2019

## 2019-08-24 ENCOUNTER — Other Ambulatory Visit: Payer: Self-pay | Admitting: Psychiatry

## 2019-08-24 DIAGNOSIS — E1165 Type 2 diabetes mellitus with hyperglycemia: Secondary | ICD-10-CM

## 2019-08-24 MED ORDER — DEXCOM G6 RECEIVER DEVI *A*
3 refills | Status: AC | PRN
Start: 2019-08-24 — End: ?

## 2019-08-27 ENCOUNTER — Other Ambulatory Visit: Payer: Self-pay | Admitting: Dermatology

## 2019-08-27 ENCOUNTER — Other Ambulatory Visit: Payer: Self-pay | Admitting: Family Medicine

## 2019-08-27 NOTE — Telephone Encounter (Signed)
Sent to [facility]. Read 2 weeks ago. Will verify whether this was picked up.

## 2019-08-27 NOTE — Telephone Encounter (Signed)
Spoke with pharmacy patient picked this up on 4/6

## 2019-08-27 NOTE — Telephone Encounter (Signed)
mychart sent to pt.

## 2019-08-27 NOTE — Telephone Encounter (Signed)
Last office visit:   08/03/2019  Patients upcoming appointments:  Future Appointments   Date Time Provider Department [facility]   09/02/2019  4:00 PM Harrison, Linda, LCSW-R [facility] None   09/08/2019  2:00 PM Nordquist, Sally C, RN D[facility] None   10/27/2019  2:00 PM McNamara, Laura, RD [facility] None   11/09/2019  2:00 PM Moore, Jillian, MD [facility]F None   08/03/2020  2:00 PM Moore, Jillian, MD [facility]F None       Recent Lab Values 04/14/2019 01/04/2018 12/17/2017 10/17/2017 10/17/2017 03/31/2017 10/29/2016   EXP DATE 2019-08-04 9/20 02/02/2018 11-03-18 08-04-18 04/04/18 04/04/2017   [facility] - - - - - - -       Recent Lab results:  GENERAL CHEMISTRY   Recent Labs     04/14/19  1903 04/13/19  1623 03/05/19  1620   NA 139 137 139   K 4.2 4.7 4.8   CL 105 104 104   CO2 20 22 25   GAP 14 11 10   UN 8 9 10   CREAT 0.81 0.98* 0.94   GFRC 88 70 74   GFRB 101 81 85   GLU 94 95 84   CA 9.7 9.8 9.5      LIPID PROFILE   Recent Labs     11/25/18  1425   CHOL 146   TRIG 129   HDL 36*   LDLC 84      LIVER PROFILE   Recent Labs     04/14/19  1903 04/13/19  1623 11/25/18  1425   ALT 13 14 20   AST 12 14 15   ALK 103 115* 107*   TB 0.3 0.5 0.4      DIABETES THYROID   Recent Labs     07/28/19  1500 11/25/18  1425   HA1C 6.4* 6.7*    No value within the past 365 days      Pending/Orders Labs:  Lab Frequency Next Occurrence   Hemoglobin A1c Once 11/03/2019   Hepatitis C antibody Once 11/03/2019   Lipid Panel (Reflex to Direct  LDL if Triglycerides more than 400) Once 11/03/2019   Microalbumin, Urine, Random Once 11/03/2019

## 2019-08-29 ENCOUNTER — Other Ambulatory Visit: Payer: Self-pay | Admitting: Primary Care

## 2019-08-29 ENCOUNTER — Other Ambulatory Visit: Payer: Self-pay | Admitting: Psychiatry

## 2019-08-29 DIAGNOSIS — E1165 Type 2 diabetes mellitus with hyperglycemia: Secondary | ICD-10-CM

## 2019-08-29 MED ORDER — DEXCOM G6 TRANSMITTER MISC *A*
3 refills | Status: DC | PRN
Start: 2019-08-29 — End: 2019-09-09

## 2019-08-29 MED ORDER — DEXCOM G6 TRANSMITTER MISC *A*
3 refills | Status: DC | PRN
Start: 2019-08-29 — End: 2019-09-10

## 2019-08-30 MED ORDER — FLUTICASONE PROPIONATE 50 MCG/ACT NA SUSP *I*
1.0000 | Freq: Every day | NASAL | 3 refills | Status: DC
Start: 2019-08-30 — End: 2020-09-01

## 2019-08-30 NOTE — Telephone Encounter (Signed)
Last office visit:   08/03/2019  Patients upcoming appointments:  Future Appointments   Date Time Provider Department [facility]   09/02/2019  4:00 PM Harrison, Linda, LCSW-R [facility] None   09/08/2019  2:00 PM Nordquist, Sally C, RN D[facility] None   10/27/2019  2:00 PM McNamara, Laura, RD [facility] None   11/09/2019  2:00 PM Moore, Jillian, MD [facility]F None   08/03/2020  2:00 PM Moore, Jillian, MD [facility]F None       Recent Lab Values 04/14/2019 01/04/2018 12/17/2017 10/17/2017 10/17/2017 03/31/2017 10/29/2016   EXP DATE 2019-08-04 9/20 02/02/2018 11-03-18 08-04-18 04/04/18 04/04/2017   [facility] - - - - - - -       Recent Lab results:  GENERAL CHEMISTRY   Recent Labs     04/14/19  1903 04/13/19  1623 03/05/19  1620   NA 139 137 139   K 4.2 4.7 4.8   CL 105 104 104   CO2 20 22 25   GAP 14 11 10   UN 8 9 10   CREAT 0.81 0.98* 0.94   GFRC 88 70 74   GFRB 101 81 85   GLU 94 95 84   CA 9.7 9.8 9.5      LIPID PROFILE   Recent Labs     11/25/18  1425   CHOL 146   TRIG 129   HDL 36*   LDLC 84      LIVER PROFILE   Recent Labs     04/14/19  1903 04/13/19  1623 11/25/18  1425   ALT 13 14 20   AST 12 14 15   ALK 103 115* 107*   TB 0.3 0.5 0.4      DIABETES THYROID   Recent Labs     07/28/19  1500 11/25/18  1425   HA1C 6.4* 6.7*    No value within the past 365 days      Pending/Orders Labs:  Lab Frequency Next Occurrence   Hemoglobin A1c Once 11/03/2019   Hepatitis C antibody Once 11/03/2019   Lipid Panel (Reflex to Direct  LDL if Triglycerides more than 400) Once 11/03/2019   Microalbumin, Urine, Random Once 11/03/2019

## 2019-09-02 ENCOUNTER — Ambulatory Visit: Payer: Medicare (Managed Care)

## 2019-09-02 DIAGNOSIS — F4312 Post-traumatic stress disorder, chronic: Secondary | ICD-10-CM

## 2019-09-02 DIAGNOSIS — Z7189 Other specified counseling: Secondary | ICD-10-CM

## 2019-09-02 DIAGNOSIS — F422 Mixed obsessional thoughts and acts: Secondary | ICD-10-CM

## 2019-09-02 NOTE — Progress Notes (Signed)
Behavioral Health Progress Note   Patient encounter was performed via Video     Patient located at: home    Provider located at: home office    Other participants in this encounter and roles:  N/A    Consent was previously obtained from the patient to complete this service via telehealth; including the potential for financial liability.  Length of Session: 45 minutes    Contact Type:  Location: Telemedicine Covid-19    Other: Video     Problem(s)/Goals Addressed from Treatment Plan:    Problem 1:   Treatment Problem #1 07/21/2019   Patient Identified Problem Bereavement       Goal for this problem:    Treatment Goal #1 07/21/2019   Patient Identified Goal Process grief stages       Progress towards this goal: Problem resolving. Comment: Patient reports some mood stability.    Mental Status Exam:  APPEARANCE: Appears stated age, Casual  ATTITUDE TOWARD INTERVIEWER: Cooperative  MOTOR ACTIVITY: WNL (within normal limits)  EYE CONTACT: Direct  SPEECH: Normal rate and tone  AFFECT: Full Range  MOOD: Normal and Sad  THOUGHT PROCESS: Normal  THOUGHT CONTENT: No unusual themes  PERCEPTION: No evidence of hallucinations  CURRENT SUICIDAL IDEATION: patient denies  CURRENT HOMICIDAL IDEATION: Patient denies  ORIENTATION: Alert and Oriented X 3.  CONCENTRATION: Good  MEMORY:   Recent: intact   Remote: intact  COGNITIVE FUNCTION: Average intelligence  JUDGMENT: Intact  IMPULSE CONTROL: Good and Fair  INSIGHT: Good and Fair    Risk Assessment:  ASSESSMENT OF RISK FOR SUICIDAL BEHAVIOR  Changes in risk for suicide from baseline Formulation of Risk and/or previous intake, including newly identified risk, if any: none  Violence risk was assessed and No Change noted from baseline formulation of risk and/or previous assessment.    Session Content:: Patient reports some mood stability. Patient told Probation officer that her Father's birthday is this Saturday. She said that she and her family have been trying to decide what to do on that day.  Patient said that the other day she started crying and thinks abnout things she wished could have said to him and didn't. She said that he's always gotten better in the past and this time he didn't. Patient said that she worries about becoming depressed/suicidal. She said that she doesn't want to do anything with [name] intimately and that she has been beating herself up for not taking care of her mother more now when she needs her. Writer validated patient's thoughts and feelings. Writer and patient discussed patient trying to be happy for everyone else and focusing more on her own self care. Patient said that she wants to be able to sleep - goes to bed around 12 - she goes to sleep between 3-4 - stays in bed until 11 - not necessarily sleeping - playing on phone - doesn't want to do anything - tries to get out of bed at 11 - sleeps usually until 10. She said that she has been keeping track of calories - doesn't want to do to [name] what her father did to mom - lost 11 pounds so far. Writer reflected back this goal and commended/encouraged patient.    Visit Diagnosis:      ICD-10-CM ICD-9-CM   1. Chronic post-traumatic stress disorder (PTSD)  F43.12 309.81   2. Mixed obsessional thoughts and acts  F42.2 300.3   3. Bereavement counseling  Z71.89 V65.40       Interventions:  Bereavement Therapy  Supportive Psychotherapy  Habit reduction    Current Treatment Plan   Created/Updated On 07/21/2019   Next Treatment Plan Due 01/20/2020     Consent was previously obtained from the patient to complete this Video consult; including the potential for financial liability.    Time spent on the  Video with patient: 40+ minutes    Length of time compiling the report:15 minutes    Plan:  Psychotherapy continues as described in care plan; plan remains the same.    NEXT APPT: 09/17/19      Tresa Res, LCSW-R

## 2019-09-08 ENCOUNTER — Telehealth: Payer: Self-pay

## 2019-09-08 ENCOUNTER — Other Ambulatory Visit: Payer: Self-pay

## 2019-09-08 ENCOUNTER — Ambulatory Visit: Payer: Medicare (Managed Care) | Attending: Nutrition | Admitting: Registered Nurse

## 2019-09-08 DIAGNOSIS — E1165 Type 2 diabetes mellitus with hyperglycemia: Secondary | ICD-10-CM

## 2019-09-08 DIAGNOSIS — IMO0002 Reserved for concepts with insufficient information to code with codable children: Secondary | ICD-10-CM

## 2019-09-08 NOTE — Patient Instructions (Addendum)
1.  Watch night time lows     Trends < 70  Contact us to evaluate basal rate change    2.  Schedule with Dr April Manson in next month    3. Continue to bolus all carbs & enter SG values to omnipod PDM

## 2019-09-08 NOTE — Progress Notes (Signed)
Kaylee Harris 44 y.o. female was seen from home today for diabetes education regarding  uncontrolled type 2 diabetes mellitus. Last visit 03/17/2019 for DSMT;  Omnipod DASH pump + DExcom G6 CGM. Post pump + CGM fu today.       Video Visit     Location of Patient: home    Location of Telemedicine Provider: hospital / clinical location    Other participants in telemedicine encounter and roles:  pt     This is an established patient visit.    Reason for visit: diabetes       Consent was obtained from the patient to complete this video visit; including the potential for financial liability.       Recent Patient Data     No data found in the last 1 encounters.          Diabetes Meds: reviewed in eRecord; patient is taking as prescribed. Trulicity [id number] mg q week; Humalog vial      Lab Results   Component Value Date    HA1C 6.4 (H) 07/28/2019    HA1C 6.7 (H) 11/25/2018    HA1C 6.8 (H) 04/20/2018    MALBR <1.20 11/25/2018    CREAT 0.81 04/14/2019    LDLC 84 11/25/2018     Self glucose monitoring:    BG meter as needed; RT CGM . Last 14 Days average 123;  Trends of 62-303 ; SG trends stable overnight.   BG today:  SG 111 mg/dl;    dexcom CGM:  TIR = 98%  1% = <70  1% = >180    -- Current management: Omnipod DASH Insulin Pump  -- Insulin pump settings:  -- Basal rates:  12 AM - 0.90 units/hour   3 AM - 1.45 units/hour  6 AM - 1.20 units/hour   12pm 1.00 units/hr ;  4pm -  1.20 units/hr   10 PM - 1.10 units/hour   Total basal 27.65;  units   TDD 52.30 nits   Basal 64% ; bolus 36%  -- Active insulin time: 4 hours  -- Bolus wizard use: yes  -- Insulin:carb ratio: 52mn 1:8 grams;   8pm 1 to 7 gms  -- Sensitivity: 40 mg/dL  -- Target blood glucose: 140 mg/dL  -- Glucose sensor: G6  Low 70; high 250      Diet: 1-2 meals per day ;; snacking   Smoking: no Alcohol: no rare     Self foot check:  self care  Eye Exam inquiry: done    Assessment:   Describes proper skill with insert PODs for pump + rotates sites  Describes proper skill  with insert Dexcom G6 CGM  Tolerating lower dose GLP1 & asking about return to 1.5 mg dose; has supply at home & will trial & have endo Fuv   Omnipod pump emergency back up plan established; prefers to use old Omnipod pump & has supplies  Appropriately grieving loss of her father in January 2021    Coun/Edu:     BARRIERS THAT AFFECT LEARNING:  Mental health ; followed by psych  READINESS TO LEARN:  good  PREFERRED METHODS OF LEARNING:  reading, demonstration and visual tools  EDUCATION PROVIDED: insulin teaching, BG Goals, using ICR and CF to determine meal bolus and  risk reduction; skin care with PODS; post pump FU  & personal CGM prob solving    Plan:     Patient Instructions   1.  Watch night time lows  DSMT SUPPORT PLAN:  Take advantage of your annual Medicare benefits for DSMT and MNT  Follow-up visit:  6 mos; teleheath    Time spent counseling patient: 30 minutes    MCR insurance MD follow up referral is in eRecord    Allowed 2 hrs Individual/group DSMT 1 1/2  hrs remain; expires 05/05/2020    Valaria Good, RN CDCES

## 2019-09-08 NOTE — Telephone Encounter (Signed)
Left message for patient to call [facility] to schedule an appointment with Dr. April Manson as her March appointment was cancelled.    Patient prefers [facility], however, I informed her that Dr. April Manson does not have availability here until August.

## 2019-09-09 ENCOUNTER — Encounter: Payer: Self-pay | Admitting: Psychiatry

## 2019-09-09 ENCOUNTER — Telehealth: Payer: Self-pay | Admitting: Psychiatry

## 2019-09-09 DIAGNOSIS — E1165 Type 2 diabetes mellitus with hyperglycemia: Secondary | ICD-10-CM

## 2019-09-09 MED ORDER — DEXCOM G6 TRANSMITTER MISC *A*
3 refills | Status: DC | PRN
Start: 2019-09-09 — End: 2019-09-09

## 2019-09-09 MED ORDER — DEXCOM G6 TRANSMITTER MISC *A*
3 refills | Status: DC | PRN
Start: 2019-09-09 — End: 2019-09-10

## 2019-09-09 MED ORDER — DEXCOM G6 SENSOR MISC *A*
3 refills | Status: DC | PRN
Start: 2019-09-09 — End: 2020-08-07

## 2019-09-09 NOTE — Telephone Encounter (Signed)
Kaylee Harris, received a call from [employer] [facility] about [employer]. Per [facility] description not appropriate and needs to state change every 3 months.Please sign if appropriate

## 2019-09-10 MED ORDER — DEXCOM G6 TRANSMITTER MISC *A*
3 refills | Status: DC
Start: 2019-09-10 — End: 2020-09-26

## 2019-09-10 NOTE — Addendum Note (Signed)
Addended by: Nada Libman ANN on: 09/10/2019 08:25 PM     Modules accepted: Orders

## 2019-09-10 NOTE — Telephone Encounter (Signed)
Pharmacy called asking for the prescription to state "change every 90 days"

## 2019-09-14 ENCOUNTER — Other Ambulatory Visit: Payer: Self-pay | Admitting: Pulmonary and Critical Care Medicine

## 2019-09-14 DIAGNOSIS — Z0000 Encounter for general adult medical examination without abnormal findings: Secondary | ICD-10-CM

## 2019-09-14 MED ORDER — VITAMIN C 100 MG PO TABS *A*
500.0000 mg | ORAL_TABLET | Freq: Every day | ORAL | 3 refills | Status: AC
Start: 2019-09-14 — End: ?

## 2019-09-14 NOTE — Telephone Encounter (Signed)
Last office visit:   08/03/2019  Patients upcoming appointments:  Future Appointments   Date Time Provider [place]   09/17/2019  2:00 PM Tresa Res, LCSW-R C[facility] None   10/25/2019  3:20 PM [name], MD CAF None   10/27/2019  2:00 PM Marjo Bicker, RD D[facility] None   08/03/2020  2:00 PM [name], MD [facility] None       Recent Lab Values 04/14/2019 01/04/2018 12/17/2017 10/17/2017 10/17/2017 03/31/2017 10/29/2016   EXP DATE 2019-08-04 9/20 02/02/2018 11-03-18 08-04-18 04/04/18 04/04/2017   [facility] - - - - - - -       Recent Lab results:  GENERAL CHEMISTRY   Recent Labs     04/14/19  1903 04/13/19  1623 03/05/19  1620   NA 139 137 139   K 4.2 4.7 4.8   CL 105 104 104   CO2 _0 GAP _1 UN _2 CREAT 0.81 0.98* 0.94   GFRC 88 70 74   GFRB 101 81 85   GLU 94 95 84   CA 9.7 9.8 9.5      LIPID PROFILE   Recent Labs     11/25/18  1425   CHOL 146   TRIG 129   HDL 36*   LDLC 84      LIVER PROFILE   Recent Labs     04/14/19  1903 04/13/19  1623 11/25/18  1425   ALT _3 AST _4 ALK 103 115* 107*   TB 0.3 0.5 0.4      DIABETES THYROID   Recent Labs     07/28/19  1500 11/25/18  1425   HA1C 6.4* 6.7*    No value within the past 365 days      Pending/Orders Labs:  Lab Frequency Next Occurrence   Hemoglobin A1c Once 11/03/2019   Hepatitis C antibody Once 11/03/2019   Lipid Panel (Reflex to Direct  LDL if Triglycerides more than 400) Once 11/03/2019   Microalbumin, Urine, Random Once 11/03/2019

## 2019-09-17 ENCOUNTER — Ambulatory Visit: Payer: Medicare (Managed Care) | Attending: Psychiatry

## 2019-09-17 DIAGNOSIS — F422 Mixed obsessional thoughts and acts: Secondary | ICD-10-CM | POA: Insufficient documentation

## 2019-09-17 DIAGNOSIS — F331 Major depressive disorder, recurrent, moderate: Secondary | ICD-10-CM | POA: Insufficient documentation

## 2019-09-17 DIAGNOSIS — F603 Borderline personality disorder: Secondary | ICD-10-CM | POA: Insufficient documentation

## 2019-09-17 DIAGNOSIS — F4312 Post-traumatic stress disorder, chronic: Secondary | ICD-10-CM

## 2019-09-17 DIAGNOSIS — Z7189 Other specified counseling: Secondary | ICD-10-CM | POA: Insufficient documentation

## 2019-09-17 NOTE — Progress Notes (Signed)
Behavioral Health Progress Note   Patient encounter was performed via Video     Patient located at: home    Provider located at: home office    Other participants in this encounter and roles:  N/A    Consent was previously obtained from the patient to complete this service via telehealth; including the potential for financial liability.  Length of Session: 45 minutes    Contact Type:  Location: Telemedicine Covid-19    Other: Video     Problem(s)/Goals Addressed from Treatment Plan:    Problem 1:   Treatment Problem #1 07/21/2019   Patient Identified Problem Bereavement       Goal for this problem:    Treatment Goal #1 07/21/2019   Patient Identified Goal Process grief stages       Progress towards this goal: Problem resolving. Comment: Patient reports some mood stability.    Mental Status Exam:  APPEARANCE: Appears stated age, Casual  ATTITUDE TOWARD INTERVIEWER: Cooperative  MOTOR ACTIVITY: WNL (within normal limits)  EYE CONTACT: Direct  SPEECH: Normal rate and tone  AFFECT: Full Range  MOOD: Normal and Sad  THOUGHT PROCESS: Normal  THOUGHT CONTENT: No unusual themes  PERCEPTION: No evidence of hallucinations  CURRENT SUICIDAL IDEATION: patient denies  CURRENT HOMICIDAL IDEATION: Patient denies  ORIENTATION: Alert and Oriented X 3.  CONCENTRATION: Good  MEMORY:   Recent: intact   Remote: intact  COGNITIVE FUNCTION: Average intelligence  JUDGMENT: Intact  IMPULSE CONTROL: Good and Fair  INSIGHT: Good and Fair    Risk Assessment:  ASSESSMENT OF RISK FOR SUICIDAL BEHAVIOR  Changes in risk for suicide from baseline Formulation of Risk and/or previous intake, including newly identified risk, if any: none  Violence risk was assessed and No Change noted from baseline formulation of risk and/or previous assessment.    Session Content:: Patient reports some mood stability. Patient talked about interpersonal issues related to her brother and sister. She said that her brother thinks she doesn't want to be around him or help  him with the girls. Patient told Probation officer that it has been a "horrible" week - brother wants them to watch the girls every day for the summer - tried to explain why they can't and he blamed her psych meds and said he guessed they will be "those kind" of aunts who only see the kids on birthdays and holiday which was upsetting. Writer validated patient. Writer and patient discussed patient's thinking patterns when triggered like this and patient identified that she feels like a "bad" aunt, not good enough. Patient said that one positive thing is that [name] agreed to go to counseling. She said that it is still very difficult dealing with her memory lapses. Writer and patient problem solved around this issue and patient said that she would look into getting a white board calendar to post things [name] needs to remember. Patient said that they are taking her mother out to look at glasses today and then maybe dinner. She said that her mother is doing a little better since they upped her medication - she found some friends where she lives now. Patient told Probation officer that she is still crying when she thinks about her father or someone mentions her father. She said that the thought of father's day is sadder than his birthday. Writer validated patient and provided support while patient processed her thoughts and feelings. Patient said that she has been working on reducing nail picking. Writer commended patient on doing this work and encouraged  patient to keep trying.    Visit Diagnosis:      ICD-10-CM ICD-9-CM   1. Chronic post-traumatic stress disorder (PTSD)  F43.12 309.81   2. Mixed obsessional thoughts and acts  F42.2 300.3   3. Bereavement counseling  Z71.89 V65.40       Interventions:  Bereavement Therapy  Supportive Psychotherapy  Taught/practiced coping skills (specify skills used):  balancing negative self talk; Mindfulness  Habit reduction    Current Treatment Plan   Created/Updated On 07/21/2019   Next Treatment Plan Due  01/20/2020     Consent was previously obtained from the patient to complete this Video consult; including the potential for financial liability.    Time spent on the  Video with patient: 40+ minutes    Length of time compiling the report:15 minutes    Plan:  Psychotherapy continues as described in care plan; plan remains the same.    NEXT APPT: 09/29/19.      Tresa Res, LCSW-R

## 2019-09-21 ENCOUNTER — Other Ambulatory Visit: Payer: Self-pay | Admitting: Family Medicine

## 2019-09-21 DIAGNOSIS — Z760 Encounter for issue of repeat prescription: Secondary | ICD-10-CM

## 2019-09-21 NOTE — Telephone Encounter (Signed)
Last office visit:   08/03/2019  Patients upcoming appointments:  Future Appointments   Date Time Provider [place]   09/29/2019  2:00 PM Tresa Res, LCSW-R CBA None   10/25/2019  3:20 PM [name], MD CAF None   10/27/2019  2:00 PM Marjo Bicker, RD DHA None   08/03/2020  2:00 PM [name], MD CAF None     Recent Lab results:  GENERAL CHEMISTRY   Recent Labs     04/14/19  1903 04/13/19  1623 03/05/19  1620   NA 139 137 139   K 4.2 4.7 4.8   CL 105 104 104   CO2 _0 GAP _1 UN _2 CREAT 0.81 0.98* 0.94   GFRC 88 70 74   GFRB 101 81 85   GLU 94 95 84   CA 9.7 9.8 9.5      LIPID PROFILE   Recent Labs     11/25/18  1425   CHOL 146   TRIG 129   HDL 36*   LDLC 84      LIVER PROFILE   Recent Labs     04/14/19  1903 04/13/19  1623 11/25/18  1425   ALT _3 AST _4 ALK 103 115* 107*   TB 0.3 0.5 0.4      DIABETES THYROID   Recent Labs     07/28/19  1500 11/25/18  1425   HA1C 6.4* 6.7*    No value within the past 365 days      Pending/Orders Labs:  Lab Frequency Next Occurrence   Hemoglobin A1c Once 11/03/2019   Hepatitis C antibody Once 11/03/2019   Lipid Panel (Reflex to Direct  LDL if Triglycerides more than 400) Once 11/03/2019   Microalbumin, Urine, Random Once 11/03/2019        Recent Lab Values 04/14/2019 01/04/2018 12/17/2017 10/17/2017 10/17/2017 03/31/2017 10/29/2016   EXP DATE 2019-08-04 9/20 02/02/2018 11-03-18 08-04-18 04/04/18 04/04/2017   [facility] - - - - - - -          Confidential Drug Report  Search Terms: [name], [birth year]   Search Date: 09/21/2019 13:55:06 PM   Searching on behalf of: [id number] - [name]   The Drug Utilization Report below displays all of the controlled substance prescriptions, if any, that your patient has filled in the last twelve months. The information displayed on this report is compiled from pharmacy submissions to the Department, and accurately reflects the information as submitted by the pharmacies.  This report was requested  by: Maretta Bees   Reference #: [id number]   Others' Prescriptions  Patient Name: Kaylee Harris   Birth Date: Nov 01, 1975   Address: [place], [place] [address]   Sex: Female   Rx Written Rx Dispensed Drug Quantity Days Supply Prescriber Name Prescriber [place] # Payment Method Dispenser   07/28/2019 08/02/2019 tramadol hcl 50 mg tablet  42 14 Reginold Agent [id number] [place]. #19   07/13/2019 07/13/2019 hydrocodone-acetaminophen 5-325 mg tablet  30 5 Melody Comas DMD [place]. #02   06/24/2019 06/28/2019 tramadol hcl 50 mg tablet  42 14 Reginold Agent [id number] [place]. #19   06/11/2019 06/11/2019 oxycodone hcl 5 mg tablet  6 3 Rene Kocher, Mirian Mo MD [id number] Insurance [place]. #02   05/12/2019 05/19/2019 tramadol hcl 50  mg tablet  42 14 Burke, [place] #19   04/10/2019 04/13/2019 tramadol hcl 50 mg tablet  42 14 [name], Darnell Level (MD) [place]. #19   02/26/2019 03/02/2019 clonazepam 0.5 mg tablet  28 28 Burke, [place]. #19   02/25/2019 02/26/2019 diphenoxylate-atropine 2.5-0.025 mg tablet  240 30 Burke, [place]. #19   02/25/2019 02/25/2019 tramadol hcl 50 mg tablet  42 14 Graceann Congress [id number] [place]. #19   01/26/2019 01/29/2019 tramadol hcl 50 mg tablet  42 14 Burke, [place]. #19   12/18/2018 12/21/2018 tramadol hcl 50 mg tablet  40 14 [name], Darnell Level (MD) [place]. #19   11/12/2018 11/15/2018 diphenoxylate-atropine 2.5-0.025 mg tablet  240 30 Verita Lamb [id number] Insurance [place]. #19   10/13/2018 10/15/2018 tramadol hcl 50 mg tablet  42 14 Burke, [place]. #19   * - Drugs marked with an asterisk are compound drugs. If the compound drug is made up of more than one controlled substance, then each controlled substance will be a separate row in the table.

## 2019-09-22 NOTE — Telephone Encounter (Signed)
Last office visit:   08/03/2019  Patients upcoming appointments:  Future Appointments   Date Time Provider [place]   09/29/2019  2:00 PM Tresa Res, LCSW-R CBA None   10/25/2019  3:20 PM [name], MD CAF None   10/27/2019  2:00 PM Marjo Bicker, RD DHA None   08/03/2020  2:00 PM [name], MD CAF None     Recent Lab results:  GENERAL CHEMISTRY   Recent Labs     04/14/19  1903 04/13/19  1623 03/05/19  1620   NA 139 137 139   K 4.2 4.7 4.8   CL 105 104 104   CO2 '20 22 25   ' GAP '14 11 10   ' UN '8 9 10   ' CREAT 0.81 0.98* 0.94   GFRC 88 70 74   GFRB 101 81 85   GLU 94 95 84   CA 9.7 9.8 9.5      LIPID PROFILE   Recent Labs     11/25/18  1425   CHOL 146   TRIG 129   HDL 36*   LDLC 84      LIVER PROFILE   Recent Labs     04/14/19  1903 04/13/19  1623 11/25/18  1425   ALT '13 14 20   ' AST '12 14 15   ' ALK 103 115* 107*   TB 0.3 0.5 0.4      DIABETES THYROID   Recent Labs     07/28/19  1500 11/25/18  1425   HA1C 6.4* 6.7*    No value within the past 365 days      Pending/Orders Labs:  Lab Frequency Next Occurrence   Hemoglobin A1c Once 11/03/2019   Hepatitis C antibody Once 11/03/2019   Lipid Panel (Reflex to Direct  LDL if Triglycerides more than 400) Once 11/03/2019   Microalbumin, Urine, Random Once 11/03/2019        Recent Lab Values 04/14/2019 01/04/2018 12/17/2017 10/17/2017 10/17/2017 03/31/2017 10/29/2016   EXP DATE 2019-08-04 9/20 02/02/2018 11-03-18 08-04-18 04/04/18 04/04/2017   [facility] - - - - - - -      Confidential Drug Report  Search Terms: Kaylee Harris, 03-05-1976   Search Date: 09/22/2019 08:02:44 AM   Searching on behalf of: [id number] - [name]   The Drug Utilization Report below displays all of the controlled substance prescriptions, if any, that your patient has filled in the last twelve months. The information displayed on this report is compiled from pharmacy submissions to the Department, and accurately reflects the information as submitted by the pharmacies.  This report was requested by:  Maretta Bees   Reference #: [id number]   Others' Prescriptions  Patient Name: Kaylee Harris   Birth Date: 07/05/1975   Address: [place], [place] [address]   Sex: Female   Rx Written Rx Dispensed Drug Quantity Days Supply Prescriber Name Prescriber [place] # Payment Method Dispenser   07/28/2019 08/02/2019 tramadol hcl 50 mg tablet  42 14 Reginold Agent [id number] [place]. #19   07/13/2019 07/13/2019 hydrocodone-acetaminophen 5-325 mg tablet  30 5 Melody Comas DMD [place]. #02   06/24/2019 06/28/2019 tramadol hcl 50 mg tablet  42 14 Reginold Agent [id number] [place]. #19   06/11/2019 06/11/2019 oxycodone hcl 5 mg tablet  6 3 Rene Kocher, Mirian Mo MD [id number] Insurance [place]. #02   05/12/2019 05/19/2019 tramadol hcl 50 mg tablet  42  [place], [place]. #19   04/10/2019 04/13/2019 tramadol hcl 50 mg tablet  38 14 [name], Darnell Level (MD) [place]. #19   02/26/2019 03/02/2019 clonazepam 0.5 mg tablet  28 28 Burke, [place]. #19   02/25/2019 02/26/2019 diphenoxylate-atropine 2.5-0.025 mg tablet  240 30 Burke, [place] #19   02/25/2019 02/25/2019 tramadol hcl 50 mg tablet  42 14 Burke, [place] #19   01/26/2019 01/29/2019 tramadol hcl 50 mg tablet  42 14 Graceann Congress [id number] [place]. #19   12/18/2018 12/21/2018 tramadol hcl 50 mg tablet  68 14 [name], Darnell Level (MD) [place]. #19   11/12/2018 11/15/2018 diphenoxylate-atropine 2.5-0.025 mg tablet  240 30 Verita Lamb [id number] [place]. #19   10/13/2018 10/15/2018 tramadol hcl 50 mg tablet  42 14 Graceann Congress [id number] [place]. #19

## 2019-09-29 ENCOUNTER — Ambulatory Visit: Payer: Medicare (Managed Care)

## 2019-09-29 DIAGNOSIS — Z7189 Other specified counseling: Secondary | ICD-10-CM

## 2019-09-29 DIAGNOSIS — F4312 Post-traumatic stress disorder, chronic: Secondary | ICD-10-CM

## 2019-09-29 DIAGNOSIS — F422 Mixed obsessional thoughts and acts: Secondary | ICD-10-CM

## 2019-09-29 NOTE — Progress Notes (Signed)
The Probation officer called and spoke to Leary.  Alixandra reports that she received a phone call from [facility] regarding CDPAP services for herself and her wife, [name].  The Probation officer and Armandina together called [facility] at [phone] and found that they did not have a CDPAP provider for Deshanna and [name] and that they received Kala's name from [place].  Naida reports that Wynelle Link, her [facility] gave her another [phone] of [facility] to call for a CDPAP provider.  The Probation officer and Leeza together called [facility] and spoke to a lady that is going to call aides for Ellsa to interview and hire if she chooses.  Elcie was very pleased with [facility] and is waiting on the aides to call for an interview.  Monday then reports that she is in need of the phone # for [facility] to call for assistance again this month.  The Probation officer provided Avan with the phone # [phone].  Bethena Roys thanked the Probation officer.  The Probation officer then checked medical records and reminded Apollonia of her upcoming appointment with therapist, Tresa Res, at 2pm today.  Florrie thanked the Probation officer for the reminder and reports needing nothing else at this time.

## 2019-09-29 NOTE — Progress Notes (Signed)
Behavioral Health Progress Note   Patient encounter was performed via       Patient located at: home    Provider located at: home office    Other participants in this encounter and roles:  N/A    Consent was previously obtained from the patient to complete this service via telehealth; including the potential for financial liability.  Length of Session: 45 minutes    Contact Type:  Location: Telemedicine Covid-19    Other: Video     Problem(s)/Goals Addressed from Treatment Plan:    Problem 1:   Treatment Problem #1 07/21/2019   Patient Identified Problem Bereavement       Goal for this problem:    Treatment Goal #1 07/21/2019   Patient Identified Goal Process grief stages       Progress towards this goal: Problem resolving. Comment: Patient reports some mood stability.    Mental Status Exam:  APPEARANCE: Appears stated age, Casual  ATTITUDE TOWARD INTERVIEWER: Cooperative  MOTOR ACTIVITY: WNL (within normal limits)  EYE CONTACT: Direct  SPEECH: Normal rate and tone  AFFECT: Full Range  MOOD: Normal and Sad  THOUGHT PROCESS: Normal  THOUGHT CONTENT: No unusual themes  PERCEPTION: No evidence of hallucinations  CURRENT SUICIDAL IDEATION: patient denies  CURRENT HOMICIDAL IDEATION: Patient denies  ORIENTATION: Alert and Oriented X 3.  CONCENTRATION: Good  MEMORY:   Recent: intact   Remote: intact  COGNITIVE FUNCTION: Average intelligence  JUDGMENT: Intact  IMPULSE CONTROL: Good and Fair  INSIGHT: Good and Fair    Risk Assessment:  ASSESSMENT OF RISK FOR SUICIDAL BEHAVIOR  Changes in risk for suicide from baseline Formulation of Risk and/or previous intake, including newly identified risk, if any: none  Violence risk was assessed and No Change noted from baseline formulation of risk and/or previous assessment.    Session Content:: Patient reports some mood stability. Patient talked about interpersonal issues related to her brother and sister. She said that she tried to manage negative thoughts around Mother's Day and  her mother's birthday and was somewhat successful. Patient told Probation officer about [employer] ongoing memory issues. She said that she continues to be frustrated and feels Clinical biochemist validated patient's thoughts and feelings.     Visit Diagnosis:      ICD-10-CM ICD-9-CM   1. Chronic post-traumatic stress disorder (PTSD)  F43.12 309.81   2. Mixed obsessional thoughts and acts  F42.2 300.3   3. Bereavement counseling  Z71.89 V65.40       Interventions:  Bereavement Therapy  Supportive Psychotherapy  Taught/practiced coping skills (specify skills used):  balancing negative self talk; Mindfulness  Habit reduction    Current Treatment Plan   Created/Updated On 07/21/2019   Next Treatment Plan Due 01/20/2020     Consent was previously obtained from the patient to complete this Video consult; including the potential for financial liability.    Time spent on the  Video with patient: 40+ minutes    Length of time compiling the report:15 minutes    Plan:  Psychotherapy continues as described in care plan; plan remains the same.    NEXT APPT: 10/19/19.    Tresa Res, LCSW-R

## 2019-10-01 ENCOUNTER — Encounter: Payer: Self-pay | Admitting: Primary Care

## 2019-10-03 ENCOUNTER — Other Ambulatory Visit: Payer: Self-pay | Admitting: Primary Care

## 2019-10-03 MED ORDER — DICYCLOMINE HCL 10 MG PO CAPS *I*
10.0000 mg | ORAL_CAPSULE | Freq: Four times a day (QID) | ORAL | 1 refills | Status: DC
Start: 2019-10-03 — End: 2019-10-06

## 2019-10-05 NOTE — Telephone Encounter (Signed)
Refilled 10/03/19

## 2019-10-06 MED ORDER — DICYCLOMINE HCL 10 MG PO CAPS *I*
10.0000 mg | ORAL_CAPSULE | Freq: Four times a day (QID) | ORAL | 1 refills | Status: DC
Start: 2019-10-06 — End: 2020-05-21

## 2019-10-06 NOTE — Telephone Encounter (Signed)
Last office visit:   08/03/2019  Patients upcoming appointments:  Future Appointments   Date Time Provider Department Center   10/19/2019  1:00 PM Harrison, Linda, LCSW-R CBA None   10/25/2019  3:20 PM Moore, Jillian, MD CAF None   10/27/2019  2:00 PM McNamara, Laura, RD DHA None   08/03/2020  2:00 PM Moore, Jillian, MD CAF None     Recent Lab results:  GENERAL CHEMISTRY   Recent Labs     04/14/19  1903 04/13/19  1623 03/05/19  1620   NA 139 137 139   K 4.2 4.7 4.8   CL 105 104 104   CO2 20 22 25   GAP 14 11 10   UN 8 9 10   CREAT 0.81 0.98* 0.94   GFRC 88 70 74   GFRB 101 81 85   GLU 94 95 84   CA 9.7 9.8 9.5      LIPID PROFILE   Recent Labs     11/25/18  1425   CHOL 146   TRIG 129   HDL 36*   LDLC 84      LIVER PROFILE   Recent Labs     04/14/19  1903 04/13/19  1623 11/25/18  1425   ALT 13 14 20   AST 12 14 15   ALK 103 115* 107*   TB 0.3 0.5 0.4      DIABETES THYROID   Recent Labs     07/28/19  1500 11/25/18  1425   HA1C 6.4* 6.7*    No value within the past 365 days      Pending/Orders Labs:  Lab Frequency Next Occurrence   Hemoglobin A1c Once 11/03/2019   Hepatitis C antibody Once 11/03/2019   Lipid Panel (Reflex to Direct  LDL if Triglycerides more than 400) Once 11/03/2019   Microalbumin, Urine, Random Once 11/03/2019        Recent Lab Values 04/14/2019 01/04/2018 12/17/2017 10/17/2017 10/17/2017 03/31/2017 10/29/2016   EXP DATE 2019-08-04 9/20 02/02/2018 11-03-18 08-04-18 04/04/18 04/04/2017   [facility] - - - - - - -

## 2019-10-06 NOTE — Telephone Encounter (Signed)
I just got a call that this wasn't sent to [facility]. please advise

## 2019-10-11 ENCOUNTER — Encounter: Payer: Self-pay | Admitting: Psychiatry

## 2019-10-16 ENCOUNTER — Other Ambulatory Visit: Payer: Self-pay | Admitting: Obstetrics and Gynecology

## 2019-10-18 ENCOUNTER — Telehealth: Payer: Self-pay

## 2019-10-18 NOTE — Telephone Encounter (Signed)
Copied from [place] [id number]. Topic: Appointments - Schedule Appointment  >> Oct 18, 2019  1:07 PM Evlyn Clines wrote:  Ms. Hogston is calling to schedule an appointment.    Appointment Type: FUV  Provider: Dr Wallis Bamberg  What does patient need to be seen for?:  Has a couple of spot on nose that is extremely raw, red, itch and bleeding in some spot.  Has a tiny hole on nose  Preferred location?: Yes [place]  Type of insurance: Medicare blue choice      Is there a referral on file?: No  Were the demographics and insurance verified?: yes  Was the patient connected to RIM if applicable?: No  Was the appropriate expectation set with the patient for a time frame to receive a return call from the office? Yes    Patient can be reached at: [phone] (home)              .

## 2019-10-19 ENCOUNTER — Ambulatory Visit: Payer: Medicare (Managed Care) | Attending: Psychiatry

## 2019-10-19 DIAGNOSIS — F4312 Post-traumatic stress disorder, chronic: Secondary | ICD-10-CM | POA: Insufficient documentation

## 2019-10-19 DIAGNOSIS — F331 Major depressive disorder, recurrent, moderate: Secondary | ICD-10-CM | POA: Insufficient documentation

## 2019-10-19 DIAGNOSIS — F422 Mixed obsessional thoughts and acts: Secondary | ICD-10-CM | POA: Insufficient documentation

## 2019-10-19 DIAGNOSIS — Z7189 Other specified counseling: Secondary | ICD-10-CM

## 2019-10-19 NOTE — Telephone Encounter (Signed)
Called and lm for pt

## 2019-10-20 NOTE — Progress Notes (Signed)
Behavioral Health Progress Note   Patient encounter was performed via       Patient located at: home    Provider located at: home office    Other participants in this encounter and roles:  N/A    Consent was previously obtained from the patient to complete this service via telehealth; including the potential for financial liability.  Length of Session: 45 minutes    Contact Type:  Location: Telemedicine Covid-19    Other: Video     Problem(s)/Goals Addressed from Treatment Plan:    Problem 1:   Treatment Problem #1 07/21/2019   Patient Identified Problem Bereavement       Goal for this problem:    Treatment Goal #1 07/21/2019   Patient Identified Goal Process grief stages       Progress towards this goal: Patient reports an additional stressor.    Mental Status Exam:  APPEARANCE: Appears stated age, Casual  ATTITUDE TOWARD INTERVIEWER: Cooperative  MOTOR ACTIVITY: WNL (within normal limits)  EYE CONTACT: Direct  SPEECH: Normal rate and tone  AFFECT: Full Range  MOOD: Anxious, Normal and Sad  THOUGHT PROCESS: Normal  THOUGHT CONTENT: No unusual themes  PERCEPTION: No evidence of hallucinations  CURRENT SUICIDAL IDEATION: patient denies  CURRENT HOMICIDAL IDEATION: Patient denies  ORIENTATION: Alert and Oriented X 3.  CONCENTRATION: Good  MEMORY:   Recent: intact   Remote: intact  COGNITIVE FUNCTION: Average intelligence  JUDGMENT: Intact  IMPULSE CONTROL: Good and Fair  INSIGHT: Good and Fair    Risk Assessment:  ASSESSMENT OF RISK FOR SUICIDAL BEHAVIOR  Changes in risk for suicide from baseline Formulation of Risk and/or previous intake, including newly identified risk, if any: none  Violence risk was assessed and No Change noted from baseline formulation of risk and/or previous assessment.    Session Content::  Patient reports an additional stressor. Patient talked about her mother's fall down a full flight of stairs yesterday at her cousin's party. She said that she is in trauma ICU with broken ribs, ruptured  spleen, fractured cervical, and head trauma. Patient recounted the event and her emergency assistance until the EMS team could get there. She said that she doesn't feel able to deal with this so closely after the death of her father. Writer offered empathetic support and validation. Writer reinforced skills to help patient manage replaying the traumatic event and worry over her mother's health.     Visit Diagnosis:      ICD-10-CM ICD-9-CM   1. Chronic post-traumatic stress disorder (PTSD)  F43.12 309.81   2. Mixed obsessional thoughts and acts  F42.2 300.3   3. Bereavement counseling  Z71.89 V65.40   4. MDD (major depressive disorder), recurrent episode, moderate  F33.1 296.32       Interventions:  Bereavement Therapy  Supportive Psychotherapy  Taught/practiced coping skills (specify skills used):  balancing negative self talk; Mindfulness, breathing, grounding, distraction    Current Treatment Plan   Created/Updated On 07/21/2019   Next Treatment Plan Due 01/20/2020     Consent was previously obtained from the patient to complete this Video consult; including the potential for financial liability.    Time spent on the  Video with patient: 40+ minutes    Length of time compiling the report:15 minutes    Plan:  Psychotherapy continues as described in care plan; plan remains the same.    NEXT APPT: 10/25/19.    Tresa Res, LCSW-R

## 2019-10-20 NOTE — Telephone Encounter (Signed)
Copied from [place] [id number]. Topic: Return Call - Speak to Provider/Office Staff  >> Oct 20, 2019  3:04 PM Evlyn Clines wrote:  Kaylee Harris is returning a call from [place].   She states this is regarding schedule an appointment.  Was the appropriate expectation set with the patient for a time frame to receive a return call from the office? N/A  Patient can be reached at: [phone]      .

## 2019-10-21 NOTE — Telephone Encounter (Signed)
Called and offered appt for today at 9:45, pt declined. Scheduled next available

## 2019-10-22 NOTE — Progress Notes (Signed)
The writer called Kaylee Harris to see how she is doing.  Kaylee Harris reports that she has not been doing well, that her mom fell down a flight of 15 steps and has many fractured bones and head trauma.  She reports that her mother has been in the hospital and she has been really worried about her.  The writer reminded Kaylee Harris to practice the skills that she learned in her sessions during times of extreme stress.  The writer also reminded Kaylee Harris to touch base with her mental health therapist.  The Probation officer then checked medical records and reminded Kaylee Harris of her upcoming appointment on June 21st at 3pm with her PCP, [name].  Kaylee Harris thanked the Probation officer.  The Probation officer then informed Kaylee Harris that she will be receiving a new Care Manager, Kaylee Harris, on 10/25/19.  The Probation officer then provided Kaylee Harris with Kaylee Harris's phone #[phone] to put into her phone to know it is Kaylee Harris when she calls.  Kaylee Harris reports needing nothing else at this time.

## 2019-10-25 ENCOUNTER — Encounter: Payer: Self-pay | Admitting: Primary Care

## 2019-10-25 ENCOUNTER — Ambulatory Visit: Payer: Medicare (Managed Care) | Admitting: Primary Care

## 2019-10-25 VITALS — BP 128/70 | HR 67 | Ht 67.99 in | Wt 257.4 lb

## 2019-10-25 DIAGNOSIS — F419 Anxiety disorder, unspecified: Secondary | ICD-10-CM

## 2019-10-25 DIAGNOSIS — E1165 Type 2 diabetes mellitus with hyperglycemia: Secondary | ICD-10-CM

## 2019-10-25 DIAGNOSIS — IMO0002 Reserved for concepts with insufficient information to code with codable children: Secondary | ICD-10-CM

## 2019-10-25 DIAGNOSIS — R21 Rash and other nonspecific skin eruption: Secondary | ICD-10-CM

## 2019-10-25 DIAGNOSIS — F32A Depression, unspecified: Secondary | ICD-10-CM

## 2019-10-25 DIAGNOSIS — Z0000 Encounter for general adult medical examination without abnormal findings: Secondary | ICD-10-CM

## 2019-10-25 DIAGNOSIS — F329 Major depressive disorder, single episode, unspecified: Secondary | ICD-10-CM

## 2019-10-25 LAB — HM DIABETES FOOT EXAM
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
## 2019-10-26 ENCOUNTER — Other Ambulatory Visit: Payer: Self-pay | Admitting: Family Medicine

## 2019-10-26 DIAGNOSIS — E1165 Type 2 diabetes mellitus with hyperglycemia: Secondary | ICD-10-CM

## 2019-10-26 NOTE — Telephone Encounter (Signed)
Last office visit:   10/25/2019  Patients upcoming appointments:  Future Appointments   Date Time Provider [place]   11/11/2019  2:00 PM Tresa Res, LCSW-R CBA None   12/23/2019 11:00 AM Earl Gala, MD DRK None   04/25/2020  3:00 PM [name], MD CAF None   08/03/2020  2:00 PM [name], MD CAF None     Recent Lab results:  [place]     04/14/19  1903 04/13/19  1623 03/05/19  1620   NA 139 137 139   K 4.2 4.7 4.8   CL 105 104 104   CO2 '20 22 25   ' GAP '14 11 10   ' UN '8 9 10   ' CREAT 0.81 0.98* 0.94   GFRC 88 70 74   GFRB 101 81 85   GLU 94 95 84   CA 9.7 9.8 9.5      LIPID PROFILE   Recent Labs     11/25/18  1425   CHOL 146   TRIG 129   HDL 36*   LDLC 84      LIVER PROFILE   Recent Labs     04/14/19  1903 04/13/19  1623 11/25/18  1425   ALT '13 14 20   ' AST '12 14 15   ' ALK 103 115* 107*   TB 0.3 0.5 0.4      DIABETES THYROID   Recent Labs     07/28/19  1500 11/25/18  1425   HA1C 6.4* 6.7*    No value within the past 365 days      Pending/Orders Labs:  Lab Frequency Next Occurrence   Lipid Panel (Reflex to Direct  LDL if Triglycerides more than 400) Once 11/03/2019   Hemoglobin A1c Once 11/24/2019   Microalbumin, Urine, Random Once 11/24/2019   Hepatitis C antibody Once 11/24/2019   Antinuclear antibody screen Once 11/24/2019   Anti DS-dna AB Once 11/24/2019        Recent Lab Values 04/14/2019 01/04/2018 12/17/2017 10/17/2017 10/17/2017 03/31/2017 10/29/2016   EXP DATE 2019-08-04 9/20 02/02/2018 11-03-18 08-04-18 04/04/18 04/04/2017   THCU - - - - - - -

## 2019-10-27 ENCOUNTER — Ambulatory Visit: Payer: Medicare (Managed Care) | Admitting: Nutrition

## 2019-10-28 ENCOUNTER — Ambulatory Visit: Payer: Medicare (Managed Care)

## 2019-10-29 ENCOUNTER — Other Ambulatory Visit
Admission: RE | Admit: 2019-10-29 | Discharge: 2019-10-29 | Disposition: A | Discharge status: Home / Self Care | Payer: Medicare (Managed Care) | Source: Ambulatory Visit | Attending: Primary Care | Admitting: Primary Care

## 2019-10-29 DIAGNOSIS — E119 Type 2 diabetes mellitus without complications: Secondary | ICD-10-CM | POA: Insufficient documentation

## 2019-10-29 LAB — LIPID PANEL
Chol/HDL Ratio: 3.3
Cholesterol: 124 mg/dL
HDL: 38 mg/dL — ABNORMAL LOW (ref 40–60)
LDL Calculated: 68 mg/dL
Non HDL Cholesterol: 86 mg/dL
Triglycerides: 91 mg/dL

## 2019-11-01 ENCOUNTER — Other Ambulatory Visit
Admission: RE | Admit: 2019-11-01 | Discharge: 2019-11-01 | Disposition: A | Discharge status: Home / Self Care | Payer: Medicare (Managed Care) | Source: Ambulatory Visit | Attending: Primary Care | Admitting: Primary Care

## 2019-11-01 DIAGNOSIS — IMO0002 Reserved for concepts with insufficient information to code with codable children: Secondary | ICD-10-CM

## 2019-11-01 DIAGNOSIS — Z0000 Encounter for general adult medical examination without abnormal findings: Secondary | ICD-10-CM

## 2019-11-01 DIAGNOSIS — R21 Rash and other nonspecific skin eruption: Secondary | ICD-10-CM | POA: Insufficient documentation

## 2019-11-01 DIAGNOSIS — E1165 Type 2 diabetes mellitus with hyperglycemia: Secondary | ICD-10-CM | POA: Insufficient documentation

## 2019-11-01 LAB — MICROALBUMIN, URINE, RANDOM
Creatinine,UR: 87 mg/dL (ref 20–300)
Microalbumin,UR: <1.2 mg/dL

## 2019-11-02 ENCOUNTER — Encounter: Payer: Self-pay | Admitting: Primary Care

## 2019-11-02 DIAGNOSIS — Z9109 Other allergy status, other than to drugs and biological substances: Secondary | ICD-10-CM

## 2019-11-02 LAB — HEMOGLOBIN A1C: Hemoglobin A1C: 6.3 % — ABNORMAL HIGH

## 2019-11-02 LAB — ANTINUCLEAR ANTIBODY SCREEN: ANA Screen: NEGATIVE

## 2019-11-02 LAB — HEPATITIS C ANTIBODY: Hep C Ab: NEGATIVE

## 2019-11-02 LAB — ANTI DS-DNA AB: dsDNA Ab: <1 IU/mL (ref 0–4)

## 2019-11-03 ENCOUNTER — Other Ambulatory Visit: Payer: Self-pay | Admitting: Psychiatry

## 2019-11-03 MED ORDER — DULAGLUTIDE 0.75 MG/0.5ML SC SOAJ *I*
0.7500 mg | SUBCUTANEOUS | 5 refills | Status: DC
Start: 2019-11-03 — End: 2020-07-21

## 2019-11-09 ENCOUNTER — Ambulatory Visit: Payer: Medicare (Managed Care) | Admitting: Primary Care

## 2019-11-09 ENCOUNTER — Other Ambulatory Visit: Payer: Self-pay | Admitting: Family Medicine

## 2019-11-09 ENCOUNTER — Other Ambulatory Visit: Payer: Self-pay | Admitting: Primary Care

## 2019-11-09 ENCOUNTER — Other Ambulatory Visit: Payer: Self-pay | Admitting: Pulmonary and Critical Care Medicine

## 2019-11-09 DIAGNOSIS — Z760 Encounter for issue of repeat prescription: Secondary | ICD-10-CM

## 2019-11-09 NOTE — Progress Notes (Signed)
Objective/Intervention brought up by: CM contacted client by phone and introduced herself as new care manager.   Client/provider identified progress/barriers since last discussed: NA  Additional Notes regarding this issue: CM and client discussed care management needs and POC goals. Client asked if CM knew if [employer] has opened back up, CM did not know but informed client that she would find out and let her know within the next day. CM also discussed upcoming appointments with client.  Care Manager actions taken during this contact to reduce barriers/promote progress: CM reminded client of appointment with therapist Tresa Res on 7/8 at 2 pm and confirmed client was able to get to this appointment. CM also ensured client was able to save CM phone number. CM will also contact [employer] resource to check on availability for client.   Follow Up Plan: CM and client agreed to meet face to face to further discuss care management needs and goals at client's home on 7/21 at 1:30 PM.

## 2019-11-10 MED ORDER — TRIAMCINOLONE ACETONIDE 0.5 % EX CREA *I*
TOPICAL_CREAM | Freq: Two times a day (BID) | CUTANEOUS | 1 refills | Status: AC
Start: 2019-11-10 — End: ?

## 2019-11-10 MED ORDER — DULOXETINE HCL 30 MG PO CPEP *I*
DELAYED_RELEASE_CAPSULE | ORAL | 3 refills | Status: DC
Start: 2019-11-10 — End: 2019-12-17

## 2019-11-10 MED ORDER — TRAMADOL HCL 50 MG PO TABS *I*
ORAL_TABLET | ORAL | 0 refills | Status: DC
Start: 2019-11-10 — End: 2019-12-07

## 2019-11-10 MED ORDER — PROMETHAZINE HCL 25 MG PO TABS *I*
25.0000 mg | ORAL_TABLET | ORAL | 3 refills | Status: DC | PRN
Start: 2019-11-10 — End: 2021-03-22

## 2019-11-10 NOTE — Telephone Encounter (Signed)
Confidential Drug Report  Search Terms: Kaylee Harris, Oct 24, 1975   Search Date: 11/10/2019 07:51:27 AM   Searching on behalf of: [id number] - Kaylee Harris   The Drug Utilization Report below displays all of the controlled substance prescriptions, if any, that your patient has filled in the last twelve months. The information displayed on this report is compiled from pharmacy submissions to the Department, and accurately reflects the information as submitted by the pharmacies.  This report was requested by: Maretta Bees   Reference #: [id number]   Others' Prescriptions  Patient Name: Kaylee Harris   Birth Date: 05-20-1975   Address: [place], [place] [address]   Sex: Female   Rx Written Rx Dispensed Drug Quantity Days Supply Prescriber Name Prescriber [place] # Payment Method Dispenser   09/22/2019 09/27/2019 tramadol hcl 50 mg tablet  42 [place], [place]. #19   07/28/2019 08/02/2019 tramadol hcl 50 mg tablet  42 14 [place] Agent [id number] [place]. #19   07/13/2019 07/13/2019 hydrocodone-acetaminophen 5-325 mg tablet  30 5 Melody Comas DMD [place]. #02   06/24/2019 06/28/2019 tramadol hcl 50 mg tablet  42 14 Reginold Agent [id number] [place]. #19   06/11/2019 06/11/2019 oxycodone hcl 5 mg tablet  6 3 Rene Kocher, Mirian Mo MD [id number] [facility] [place]. #02   05/12/2019 05/19/2019 tramadol hcl 50 mg tablet  42 14 Graceann Congress [id number] [place]. #19   04/10/2019 04/13/2019 tramadol hcl 50 mg tablet  24 14 Kaylee Harris, Darnell Level (MD) [place]. #19   02/26/2019 03/02/2019 clonazepam 0.5 mg tablet  28 28 Graceann Congress [id number] [place]. #19   02/25/2019 02/26/2019 diphenoxylate-atropine 2.5-0.025 mg tablet  240 30 Graceann Congress [id number] [place]. #19   02/25/2019 02/25/2019 tramadol hcl 50 mg tablet  42 14 Graceann Congress [id number] [place]. #19   01/26/2019 01/29/2019 tramadol hcl 50 mg tablet  42 14 Graceann Congress [id number] [place]. #19   12/18/2018 12/21/2018 tramadol hcl 50 mg tablet  60 14 Kaylee Harris, Darnell Level (MD) [place]. #19   11/12/2018 11/15/2018 diphenoxylate-atropine 2.5-0.025 mg tablet  240 [place], [place] [id number] [place]. #19   * - Drugs marked with an asterisk are compound drugs. If the compound drug is made up of more than one controlled substance, then each controlled substance will be a separate row in the table.    Last office visit:   10/25/2019  Patients upcoming appointments:  Future Appointments   Date Time Provider [place]   11/11/2019  2:00 PM Tresa Res, LCSW-R CBA None   12/23/2019 11:00 AM Earl Gala, MD DRK None   04/25/2020  3:00 PM Kaylee Drilling, MD CAF None   08/03/2020  2:00 PM Kaylee Drilling, MD CAF None     Recent Lab results:  GENERAL CHEMISTRY   Recent Labs     04/14/19  1903 04/13/19  1623 03/05/19  1620   NA 139 137 139   K 4.2 4.7 4.8   CL 105 104 104   CO2 _0 GAP _1 UN _2 CREAT 0.81 0.98* 0.94   GFRC 88 70 74   GFRB 101 81 85   GLU  94 95 84   CA 9.7 9.8 9.5      LIPID PROFILE   Recent Labs     10/29/19  1444 11/25/18  1425   CHOL 124 146   TRIG 91 129   HDL 38* 36*   LDLC 68 84      LIVER PROFILE   Recent Labs     04/14/19  1903 04/13/19  1623 11/25/18  1425   ALT _0 AST _1 ALK 103 115* 107*   TB 0.3 0.5 0.4      DIABETES THYROID   Recent Labs     11/01/19  1739 07/28/19  1500 11/25/18  1425   HA1C 6.3* 6.4* 6.7*    No value within the past 365 days      Pending/Orders Labs:  Lab Frequency Next Occurrence        Recent Lab Values 04/14/2019 01/04/2018 12/17/2017 10/17/2017 10/17/2017 03/31/2017 10/29/2016   EXP DATE 2019-08-04 9/20 02/02/2018 11-03-18  08-04-18 04/04/18 04/04/2017   THCU - - - - - - -

## 2019-11-10 NOTE — Telephone Encounter (Signed)
Last office visit:   10/25/2019  Patients upcoming appointments:  Future Appointments   Date Time Provider [place]   11/11/2019  2:00 PM Tresa Res, LCSW-R CBA None   12/23/2019 11:00 AM Earl Gala, MD DRK None   04/25/2020  3:00 PM [name], MD CAF None   08/03/2020  2:00 PM [name], MD CAF None     Recent Lab results:  [place]     04/14/19  1903 04/13/19  1623 03/05/19  1620   NA 139 137 139   K 4.2 4.7 4.8   CL 105 104 104   CO2 _0 GAP _1 UN _2 CREAT 0.81 0.98* 0.94   GFRC 88 70 74   GFRB 101 81 85   GLU 94 95 84   CA 9.7 9.8 9.5      LIPID PROFILE   Recent Labs     10/29/19  1444 11/25/18  1425   CHOL 124 146   TRIG 91 129   HDL 38* 36*   LDLC 68 84      LIVER PROFILE   Recent Labs     04/14/19  1903 04/13/19  1623 11/25/18  1425   ALT _3 AST _4 ALK 103 115* 107*   TB 0.3 0.5 0.4      DIABETES THYROID   Recent Labs     11/01/19  1739 07/28/19  1500 11/25/18  1425   HA1C 6.3* 6.4* 6.7*    No value within the past 365 days      Pending/Orders Labs:  Lab Frequency Next Occurrence        Recent Lab Values 04/14/2019 01/04/2018 12/17/2017 10/17/2017 10/17/2017 03/31/2017 10/29/2016   EXP DATE 2019-08-04 9/20 02/02/2018 11-03-18 08-04-18 04/04/18 04/04/2017   THCU - - - - - - -

## 2019-11-10 NOTE — Telephone Encounter (Signed)
Last office visit:   10/25/2019  Patients upcoming appointments:  Future Appointments   Date Time Provider Department [place]   11/11/2019  2:00 PM Harrison, Linda, LCSW-R CBA None   12/23/2019 11:00 AM Somers, Kathryn, MD DRK None   04/25/2020  3:00 PM Moore, Jillian, MD CAF None   08/03/2020  2:00 PM Moore, Jillian, MD CAF None     Recent Lab results:  GENERAL CHEMISTRY   Recent Labs     04/14/19  1903 04/13/19  1623 03/05/19  1620   NA 139 137 139   K 4.2 4.7 4.8   CL 105 104 104   CO2 20 22 25   GAP 14 11 10   UN 8 9 10   CREAT 0.81 0.98* 0.94   GFRC 88 70 74   GFRB 101 81 85   GLU 94 95 84   CA 9.7 9.8 9.5      LIPID PROFILE   Recent Labs     10/29/19  1444 11/25/18  1425   CHOL 124 146   TRIG 91 129   HDL 38* 36*   LDLC 68 84      LIVER PROFILE   Recent Labs     04/14/19  1903 04/13/19  1623 11/25/18  1425   ALT 13 14 20   AST 12 14 15   ALK 103 115* 107*   TB 0.3 0.5 0.4      DIABETES THYROID   Recent Labs     11/01/19  1739 07/28/19  1500 11/25/18  1425   HA1C 6.3* 6.4* 6.7*    No value within the past 365 days      Pending/Orders Labs:  Lab Frequency Next Occurrence        Recent Lab Values 04/14/2019 01/04/2018 12/17/2017 10/17/2017 10/17/2017 03/31/2017 10/29/2016   EXP DATE 2019-08-04 9/20 02/02/2018 11-03-18 08-04-18 04/04/18 04/04/2017   THCU - - - - - - -

## 2019-11-11 ENCOUNTER — Ambulatory Visit: Payer: Medicare (Managed Care) | Attending: Psychiatry

## 2019-11-11 DIAGNOSIS — Z7189 Other specified counseling: Secondary | ICD-10-CM | POA: Insufficient documentation

## 2019-11-11 DIAGNOSIS — F4312 Post-traumatic stress disorder, chronic: Secondary | ICD-10-CM | POA: Insufficient documentation

## 2019-11-11 DIAGNOSIS — F422 Mixed obsessional thoughts and acts: Secondary | ICD-10-CM | POA: Insufficient documentation

## 2019-11-11 MED ORDER — CETIRIZINE HCL 10 MG PO TABS *I*
10.0000 mg | ORAL_TABLET | Freq: Two times a day (BID) | ORAL | 1 refills | Status: DC
Start: 2019-11-11 — End: 2020-01-26
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
## 2019-11-11 NOTE — Addendum Note (Signed)
Addended by: Karen Chafe on: 11/11/2019 12:54 PM     Modules accepted: Orders

## 2019-11-12 ENCOUNTER — Encounter: Payer: Self-pay | Admitting: Psychiatry

## 2019-11-13 ENCOUNTER — Other Ambulatory Visit: Payer: Self-pay | Admitting: Family Medicine

## 2019-11-15 NOTE — Telephone Encounter (Signed)
Last office visit:   10/25/2019  Patients upcoming appointments:  Future Appointments   Date Time Provider [place]   11/22/2019  3:00 PM Tresa Res, LCSW-R CBA None   12/23/2019 11:00 AM Earl Gala, MD DRK None   04/25/2020  3:00 PM [name], MD CAF None   08/03/2020  2:00 PM [name], MD CAF None     Recent Lab results:  [place]     04/14/19  1903 04/13/19  1623 03/05/19  1620   NA 139 137 139   K 4.2 4.7 4.8   CL 105 104 104   CO2 _0 GAP _1 UN _2 CREAT 0.81 0.98* 0.94   GFRC 88 70 74   GFRB 101 81 85   GLU 94 95 84   CA 9.7 9.8 9.5      LIPID PROFILE   Recent Labs     10/29/19  1444 11/25/18  1425   CHOL 124 146   TRIG 91 129   HDL 38* 36*   LDLC 68 84      LIVER PROFILE   Recent Labs     04/14/19  1903 04/13/19  1623 11/25/18  1425   ALT _3 AST _4 ALK 103 115* 107*   TB 0.3 0.5 0.4      DIABETES THYROID   Recent Labs     11/01/19  1739 07/28/19  1500 11/25/18  1425   HA1C 6.3* 6.4* 6.7*    No value within the past 365 days      Pending/Orders Labs:  Lab Frequency Next Occurrence        Recent Lab Values 04/14/2019 01/04/2018 12/17/2017 10/17/2017 10/17/2017 03/31/2017 10/29/2016   EXP DATE 2019-08-04 9/20 02/02/2018 11-03-18 08-04-18 04/04/18 04/04/2017   THCU - - - - - - -

## 2019-11-22 ENCOUNTER — Ambulatory Visit: Payer: Medicare (Managed Care)

## 2019-11-22 DIAGNOSIS — F4312 Post-traumatic stress disorder, chronic: Secondary | ICD-10-CM

## 2019-11-22 DIAGNOSIS — Z7189 Other specified counseling: Secondary | ICD-10-CM

## 2019-11-23 NOTE — Progress Notes (Signed)
Behavioral Health Progress Note   Length of Session: 45 minutes    Contact Type:  Location: On Site    Face to Face     Problem(s)/Goals Addressed from Treatment Plan:    Problem 1:   Treatment Problem #1 07/21/2019   Patient Identified Problem Bereavement       Goal for this problem:    Treatment Goal #1 07/21/2019   Patient Identified Goal Process grief stages       Progress towards this goal: Patient talked about ongoing stressors.    Mental Status Exam:  APPEARANCE: Appears stated age, Casual  ATTITUDE TOWARD INTERVIEWER: Cooperative  MOTOR ACTIVITY: WNL (within normal limits)  EYE CONTACT: Direct  SPEECH: Normal rate and tone  AFFECT: Full Range  MOOD: Anxious, Normal and Sad  THOUGHT PROCESS: Normal  THOUGHT CONTENT: No unusual themes  PERCEPTION: No evidence of hallucinations  CURRENT SUICIDAL IDEATION: patient denies  CURRENT HOMICIDAL IDEATION: Patient denies  ORIENTATION: Alert and Oriented X 3.  CONCENTRATION: Good  MEMORY:   Recent: intact   Remote: intact  COGNITIVE FUNCTION: Average intelligence  JUDGMENT: Intact  IMPULSE CONTROL: Good and Fair  INSIGHT: Good and Fair    Risk Assessment:  ASSESSMENT OF RISK FOR SUICIDAL BEHAVIOR  Changes in risk for suicide from baseline Formulation of Risk and/or previous intake, including newly identified risk, if any: none  Violence risk was assessed and No Change noted from baseline formulation of risk and/or previous assessment.    Session Content:: Patient talked about ongoing stressors. Patient told Probation officer that she is feelings pressured and overwhelmed by all of the things going on with her and her family. She said that her mother asks her every day if she is coming to see her and she feels pressured to do this. Patient said that she has been paying all of her mother's bills and feels stressed about this. She said that her brother and sister keep coming up with excuses as to why they don't have time. Patient said that [name] keeps getting worse and she feels  helpless about this. She said that she is "damned if she does or doesn't" when it comes to [place] forgetting things. Patient said that it is all too much and too many things happened to close together starting with her father's death. Writer provided support and validation. Writer reinforced patient observing her own limits and setting clear boundaries.      Visit Diagnosis:      ICD-10-CM ICD-9-CM   1. Bereavement counseling  Z71.89 V65.40   2. Chronic post-traumatic stress disorder (PTSD)  F43.12 309.81       Interventions:  Bereavement Therapy  Supportive Psychotherapy  Taught/practiced coping skills (specify skills used):  Mindfulness, breathing, grounding  Taught/practiced communication skills (specify skills used):  Interpersonal, observe limits, setting boundaries.    Current Treatment Plan   Created/Updated On 07/21/2019   Next Treatment Plan Due 01/20/2020     Plan:  Psychotherapy continues as described in care plan; plan remains the same.    NEXT APPT: 12/13/19.    Tresa Res, LCSW-R

## 2019-11-29 ENCOUNTER — Encounter: Payer: Self-pay | Admitting: Psychiatry

## 2019-12-02 ENCOUNTER — Encounter: Payer: Self-pay | Admitting: Psychiatry

## 2019-12-02 ENCOUNTER — Ambulatory Visit: Payer: Medicare (Managed Care) | Admitting: Psychiatry

## 2019-12-02 VITALS — BP 124/80 | HR 72 | Resp 18 | Ht 67.99 in | Wt 256.0 lb

## 2019-12-02 DIAGNOSIS — E11649 Type 2 diabetes mellitus with hypoglycemia without coma: Secondary | ICD-10-CM

## 2019-12-02 NOTE — Progress Notes (Signed)
Endocrinology, Diabetes, and Metabolism [facility] for Diabetes.      HPI:    This is a 44 y.o. female with a history of Type 2 Diabetes for almost 10 years.  Known complications include none.    Since her last visit, she has had significant stress and loss. She is still bereaving the loss her father in January. Her mother also suffered a fall and neck fracture and is currently in rehab.  She admits to significant whole body pain due to her fibromyalgia.    She is trying very hard to manage her Diabetes well. More recently she is using an app to manage her food intake. She has been having lows more often after eating. A few weeks ago, we reduced her carb ratios.  Today her blood sugar is in the 80s while she comes in to her appt with her significant other, [name] who is very supportive and still has 1.5 units of IOB with the arrow trending down.  Recently she had pizza for dinner and did not bolus, she had an alcoholic drink (which is rare) but started to drop, she drank orange juice and did not spike at all. She ended up with BG in the 130s    Over 2.5 years she has lost 30 lbs.    Denies any CP or SOB, no polyuria, no polydipsia. Occasional double vision when she feels a migraine is coming on, she takes topamax for her Migraines which seem helpful. No blurry vision. No severe hypoglycemia requiring assistance or EMS. No paresthesias in hands or feet. She does have a slight numbness on left thigh ever since she had R shoulder surgery ( was told it could have been since she was placed on her left side during the procedure).  No open wounds or sores on feet and performs daily self checks.Has ongoing diabetes health maintenance activities done including routine eye exams, podiatry and dental.    Current diabetes regimen:   Trulicity 1.51VO weekly     Humalog in pump  OMNIPOD DASH-Dexcom G6  Basal Rates:  MN 0.8 units/hr  3AM 1.15 units/hr  6AM 1.2 units/hr  Noon  1.0 units/hr  4PM 1.2 units/hr---->0.95  10pm 1.1  units/hr--->0.8      TDB 26.45--- now 24.35    ICR MN 9   8pm 8--->  MN-MN 12  ISF 40  Target 140  Active Insulin 4h  Glucose Sensor - G6 Dexcom  She is trying to eat smaller portions more often and when she boluses she purposely puts less carbs in pump to avoid having to treat a low, POTS makes her feel tired and bloated.    She lacks motivation right now and feels fatigued often    Blood sugar logs and/or meter were Reviewed on Glooko/Dexcom  Past 30 days  Lowest BG in the 50s    Fasting 70s-100s  Lunch 100s-130s  Dinner  80s-190s post 50s/60s-mid 100s  Bedtime 140s-200s  OVN 66-200s        Checking 4-6x daily.     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 Lower abdomen with cellulitis changes, wound probed and  purulent material expressed.  Had PICC line for "multiple infiltrations" of what? D/c-ed home on 10d of Augmentin 9/11.      Anginal pain     Anxiety     Arthritis     Asthma     Depression     bipolar    Diabetes mellitus     Previously on SU and metformin, now diet controlled    Diverticulitis 09/2010    Dysfunctional uterine bleeding     Fibromyalgia     GERD (gastroesophageal reflux disease)     GERD (gastroesophageal reflux disease) 10/04/2016    High blood pressure     Hyperlipidemia     Liver disease     fatty liver     Long term (current) use of insulin, Dermal Adhesed V-Go Insulin Delivery Device 10/04/2016    Migraine     Neuromuscular disorder     POTS (postural orthostatic tachycardia syndrome)     Sebaceous cyst of breast     right axilla    TMJ click, left 0/01/8118    Varicella      Past Surgical History:   Procedure Laterality Date    APPENDECTOMY  2016     arthroscopic shoulder surgery Right 2010    Related to lifting    CARDIAC CATHETERIZATION  09/2011    negative    COLON SURGERY      COSMETIC SURGERY Bilateral 12/12/2017    B/L internal maxillary arteries embolized due to recurrent epistaxis, Dr Linard Millers    DILATION AND CURETTAGE OF UTERUS  2005, 2007    x2 for menorraghia    LEFT COLECTOMY  01/03/14    Dr Tresa Res    loop recorder  Feb 03 2014    loop recorder removal      PR COLONOSCOPY THRU COLOTOMY N/A 02/16/2016    Procedure: COLONOSCOPY;  Surgeon: Kelly Splinter, MD;  Location: [facility];  Service: GI    PR CYSTOURETHROSCOPY,BIOPSY N/A 10/17/2016    Procedure: CYSTOSCOPY BLADDER ;  Surgeon: Ed Blalock, MD;  Location: [facility];  Service: Urology    PR EDG TRANSORAL BIOPSY SINGLE/MULTIPLE N/A 10/29/2016    Procedure: EGD;  Surgeon: Kelly Splinter, MD;  Location: [facility];  Service: GI    TONSILLECTOMY       Family History   Problem Relation Age of Onset    Hypertension Father     Diabetes Father     Kidney Disease Father     Hyperlipidemia Father     Heart attack Father 12    Other Father         PVD    Heart Disease Father     Hypertension Mother     Hyperlipidemia Mother     Heart attack Mother 67    Diabetes Mother     Eczema Mother     Psoriasis Mother     Heart Disease Mother     Hypertension Brother     Heart Disease Brother 79        prinzmetal's angina    Heart Disease Sister         currently having work up    [place] Sister     Hypertension Sister     Breast cancer Maternal Grandmother     Stroke Other     Thyroid disease Sister     Thyroid cancer Sister     Anesthesia problems Neg Hx      Social History  Socioeconomic History    Marital status: Married     Spouse name: Not on file    Number of children: Not on file    Years of education: Not on file    Highest education level: Not on file   Tobacco Use    Smoking status: Former Smoker     Packs/day: 0.50      Years: 3.00     Pack years: 1.50     Types: Cigarettes    Smokeless tobacco: Never Used    Tobacco comment: quit age late 57s   Substance and Sexual Activity    Alcohol use: Yes     Alcohol/week: 0.0 standard drinks     Comment: 0-1 drink/ month    Drug use: No    Sexual activity: Yes     Partners: Female     Birth control/protection: I.U.D.   Other Topics Concern    Special Diet Not Asked    Exercise Not Asked    Weight Concern Not Asked    Caffeine Concern Not Asked   Social History Narrative    ** Merged History Encounter **         Married to [employer], recently relocated back to [place] from [place] due to family stressors. 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        Allergies:   Allergies   Allergen Reactions    Morphine Itching    Trazodone Anaphylaxis    Seasonal Allergies Itching    Lidocaine Rash     Patches caused a rash    Nsaids Other (See Comments)     Bleeding/epistaxis    Other [Other] Other (See Comments)     Derma Bond(skin glue) pruritis/redness and c/o respiratory distress.   Received name: Other       Current Outpatient Medications on File Prior to Visit   Medication Sig Dispense Refill    SUMAtriptan (IMITREX) 50 MG tablet TAKE 1 TABLET BY MOUTH AS NEEDED FOR MIGRAINE, TAKE AT ONSET OF HEADACHE, MAY REPEAT ONCE IN 2 HOURS 27 tablet 1    cetirizine (ZYRTEC) 10 MG tablet Take 1 tablet (10 mg total) by mouth 2 times daily 180 tablet 1    DULoxetine (CYMBALTA) 30 MG DR capsule TAKE 1 CAPSULE BY MOUTH TWO TIMES DAILY 180 capsule 3    traMADol (ULTRAM) 50 MG tablet TAKE 1 TABLET BY MOUTH EVERY 8 HOURS AS NEEDED FOR HEAD AND ABDOMINAL PAIN MAXIMUM DAILY DOSE OF 3 TABLETS PER DAY 42 tablet 0    promethazine (PHENERGAN) 25 MG tablet Take 1 tablet (25 mg total) by mouth every 4-6 hours as needed 30 tablet 3    triamcinolone (KENALOG) 0.5 % cream Apply topically 2 times daily to the following areas: arms 45 g 1    dulaglutide (TRULICITY)  2.02 RK/2.7CW pen Inject 0.5 mLs (0.75 mg total) into the skin every 7 days 2 mL 5    Alcohol Swabs (SM ALCOHOL PREP) 70 % PADS USE 1 PAD TOPICALLY TWO TIMES DAILY TO CHECK BLOOD GLUCOSE 100 each 3    clindamycin (CLEOCIN T) 1 % external solution APPLY TOPICALLY TWO TIMES DAILY, CONTINUE UNTIL IMPROVEMENT IN CYSTS 60 mL 1    dicyclomine (BENTYL) 10 MG capsule Take 1 capsule (10 mg total) by mouth 4 times daily (before meals and nightly) TAKE 1 CAPSULE BY MOUTH FOUR TIMES DAILY BEFORE MEALS AND NIGHTLY 360 capsule 1    ascorbic acid (VITAMIN C)  100 MG tablet Take 5 tablets (500 mg total) by mouth daily 100 tablet 3    Continuous Blood Gluc Transmit (DEXCOM G6 TRANSMITTER) Attach to sensor. Change every 90 days 1 each 3    Continuous Blood Gluc Sensor (DEXCOM G6 SENSOR) Inject into the skin as needed (change every 10 days) 9 each 3    fluticasone (FLONASE) 50 MCG/ACT nasal spray Spray 1 spray into nostril daily 48 g 3    Continuous Blood Gluc Receiver (DEXCOM G6 RECEIVER) By no specified route as needed 9 each 3    famotidine (PEPCID) 20 MG tablet Take 1 tablet (20 mg total) by mouth nightly as needed for Heartburn 90 tablet 1    pantoprazole (PROTONIX) 40 MG EC tablet Take 1 tablet (40 mg total) by mouth daily Swallow whole. Do not crush, break, or chew. 90 tablet 3    benzoyl peroxide 10 % LIQD external liquid Apply topically 2 times daily to the following areas: under arms, under breast, groin 227 g 3    LIDOCAINE HCL URETHRAL/MUCOSAL 2 % jelly APPLY TOPICALLY THREE TIMES DAILY AS NEEDED FOR PAIN 30 mL 4    albuterol HFA (VENTOLIN HFA) 108 (90 Base) MCG/ACT inhaler Shake well before each use.INHALE 1 TO 2 PUFFS BY MOUTH INTO THE LUNGS EVERY 6 HOURS AS NEEDED FOR WHEEZING. SHAKE WELL BEFORE EACH USE. 1 each 1    insulin lispro 100 UNIT/ML injection vial Inject as directed via pump up to Maximum  60  Units/day 60 mL 3    busPIRone (BUSPAR) 30 MG tablet Take 1 tablet (30 mg total) by mouth 2 times daily  180 tablet 3    fluticasone (FLOVENT HFA) 220 MCG/ACT inhaler Inhale 1 puff into the lungs daily Shake well before each use. 1 each 2    lamoTRIgine (LAMICTAL) 150 MG tablet TAKE 1 TABLET BY MOUTH EVERY DAY 90 tablet 2    topiramate (TOPAMAX) 100 MG tablet Take 1 tablet (100 mg total) by mouth 2 times daily 180 tablet 3    clonazePAM (KLONOPIN) 0.5 MG tablet Take 1 tablet (0.5 mg total) by mouth daily as needed Max daily dose: 0.5 mg 28 tablet 0    insulin syringe-needle U-100 (BD INSULIN SYRINGE U/F) 31G X 5/16" 0.3 ML HALF-UNIT USE THREE TIMES DAILY AS INSTRUCTED 100 each 10    diphenoxylate-atropine (LOMOTIL) 2.5-0.025 MG per tablet Take 1-2 tablets by mouth 4 times daily as needed for Diarrhea Max daily dose: 8 tablets Code D 240 tablet 0    QUEtiapine (SEROQUEL XR) 50 MG 24 hr tablet Take 1 tablet (50 mg total) by mouth nightly Swallow whole. Do not chew, crush, or break. 90 tablet 3    blood pressure monitor, automatic with arm cuff Use as directed to monitor blood pressure 1 each 0    incontinence supply disposable Order Description:  Wear as needed for stool leakage 100 each 3    Insulin Disposable Pump (OMNIPOD DASH 5 PACK) MISC By 1 Device no specified route as needed Change every 2 days = 45 pods for 90 days supply = 9 (5packs) 45 each 3    zinc oxide 20 % ointment Apply topically as needed for Dry Skin 30 g 3    clotrimazole (LOTRIMIN) 1 % cream Apply topically 2 times daily 15 g 3    Continuous Blood Gluc Receiver (DEXCOM G6 RECEIVER) DEVI By 1 Device no specified route as needed 1 Device 0    famciclovir (FAMVIR) 500 MG tablet  Take 1 tablet (500 mg total) by mouth 3 times daily as needed 30 tablet 5    glucagon (BAQSIMI) 3 MG/DOSE nasal powder Inhale one dose (3 mg) into one nostril once as needed for low blood sugar. If no response in 15 min, inhale second dose from new device 2 each 5    blood glucose test strip Test  8 times a day.   Brand name of strips Contour NEXT test strips for  linked omnipod pump 250 strip 11    lancets (BAYER MICROLET) Use   8  times per day as instructed for blood glucose testing. 250 each 11    azelastine (OPTIVAR) 0.05 % ophthalmic solution Place 1 drop into both eyes 2 times daily      SUMAtriptan refill (IMITREX STATDOSE) 6 MG/0.5ML injection INJECT 0.5ML UNDER THE SKIN ONCE AS NEEDED FOR MIGRAINE. MAY REPEAT DOSE ONCE AFTER 1 HOUR 2 Syringe 5    glucagon (GLUCAGEN) 1 MG injection kit Inject 1 mg into the skin once as needed for Low blood sugar   Discard any unused portion. 1 each 5    atorvastatin (LIPITOR) 40 MG tablet Take 1 tablet (40 mg total) by mouth daily (with dinner) 90 tablet 3    metoprolol (TOPROL-XL) 25 MG 24 hr tablet Take by mouth daily         midodrine (PROAMATINE) 10 MG tablet Take 1 tablet (10 mg total) by mouth 3 times daily 90 tablet 0    Non-System Medication The above patient is followed in our clinic and cannot resume work permanently. 1 each 1     No current facility-administered medications on file prior to visit.     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 no  -Dawn phenomenon:  yes  -HbA1c greater than 7% or 1% over upper range of normal: no  -History of suboptimal glycemic control before or during pregnancy: na  -LAST 30 DAYS of BG LOGS (in notes below) or ranges from: Glooko/G6 report 50s-200s    Review of Systems:  CONSTITUTIONAL:  Appetite good, no fevers, night sweats, weight loss  HEAD: No headache, dizziness or syncope  EYES:No  vision changes or eye pain, intermittent blurry vision with migraine  ENT: No hearing difficulties or ear pain  CV: No chest pain, shortness of breath or edema.  RESPIRATORY:  No SOB, cough or wheezing  GI:  No nausea, vomiting, abdominal pain improved since dicyclomine  GU:  No dysuria, urgency or incontinence  NEURO:  No mental status changes, motor weakness or sensory changes  PSYCH:  + depression and anxiety, worsened since COVID and father being ill  MS:  No joint pain, swelling or musculoskeletal deformities  SKIN:  No rashes  HEME/LYMPH:  No easy bleeding, bruising or swollen nodes  ENDOCRINE:  No polyuria, polydipsia, polyphagia or heat/cold intolerance    Patient's problem list, allergies, and medications were  reviewed and updated as appropriate.  Please see the EHR for full details.    Physical Examination:   There were no vitals filed for this visit.  There is no height or weight on file to calculate BMI.  Wt Readings from Last 3 Encounters:   10/25/19 116.8 kg (257 lb 6.4 oz)   08/03/19 118.8 kg (262 lb)   07/28/19 119.7 kg (264 lb)       CONSTITUTIONAL:  well developed, well nourished, no acute distress  HEENT:  no lid lank, no proptosis.   NECK on inspection, no thyromegaly,   HEART/VASCULAR:no LE edema, 2+ DP pulses  EXTREMITIES:  joints without deformity or synovitis.  NEUROLOGICAL: alert and oriented x 3  SKIN:  No rashes.   ENDOCRINE: No supraclavicular fat pads, facial plethora, moon facies,   PSYCH:mood appropriate    Labs and Imaging:    I personally reviewed and confirmed all laboratory and radiology testing listed below:      Lab results: 04/14/19  1903   Sodium 139   Potassium 4.2   Chloride 105   CO2 20   UN 8   Creatinine 0.81   GFR,Caucasian 88   GFR,Black 101   Glucose 94   Calcium 9.7         Lab Results   Component Value Date    HA1C 6.3 (H) 11/01/2019       Lab Results   Component Value Date    ALT 13 04/14/2019    AST 12 04/14/2019     Lab Results   Component Value Date    CHOL 124  10/29/2019    HDL 38 (L) 10/29/2019    LDLC 68 10/29/2019    TRIG 91 10/29/2019    [place] 3.3 10/29/2019     Lab Results   Component Value Date    TSH 1.50 01/22/2018       Lab Results   Component Value Date    WBC 11.1 (H) 07/28/2019    HGB 13.0 07/28/2019    HCT 43 07/28/2019    MCV 89 07/28/2019    PLT 373 (H) 07/28/2019       Assessment: This is a 44 y.o. female with Type 2 Diabetes Uncontrolled with hyperglycemia.  Goal A1c is <7%. Diabetes worsening with recurrent hypoglycemia that is likely multifactorial due to weight loss and healthy lifestyle with reduced insulin requirements.  GLP-1 may also be contributing to appetite suppression and limiting postprandial excursions. She will need a reduction in her basal settings and carb ratio    Plan:   Pump changes:  4PM 1.2 units/hr---->0.95  10pm 1.1 units/hr--->0.8      TDB 26.45--- now 24.35  ICR MN 9   8pm 8--->  MN-MN 12  She will send me an update via mychart  BP - on betablocker and diuretic  On statin therapy.  Recommend annual dilated eye exams and biannual dental exams for routine health maintenance.  ANNUAL labs before next appointment  Continue mental health follow up    PRESCRIPTIONS:    [place]: 3 month(s) or sooner if needed.      Nada Libman, NP    Nada Libman, NP  [facility] Department of Endocrinology  [address], [place], [place] [address]  Phone [phone]  Fax [phone]

## 2019-12-07 ENCOUNTER — Encounter: Payer: Self-pay | Admitting: Psychiatry

## 2019-12-07 ENCOUNTER — Other Ambulatory Visit: Payer: Self-pay

## 2019-12-07 DIAGNOSIS — Z760 Encounter for issue of repeat prescription: Secondary | ICD-10-CM

## 2019-12-07 NOTE — Telephone Encounter (Signed)
Last office visit:   10/25/2019  Patients upcoming appointments:  Future Appointments   Date Time Provider George   12/13/2019  3:00 PM Tresa Res, LCSW-R CBA None   12/23/2019 11:00 AM Earl Gala, MD DRK None   03/06/2020 10:40 AM Tonette Bihari, MD HEC None   04/25/2020  3:00 PM [name], MD CAF None   08/03/2020  2:00 PM [name], MD CAF None       Recent Lab Values 04/14/2019 01/04/2018 12/17/2017 10/17/2017 10/17/2017 03/31/2017 10/29/2016   EXP DATE 2019-08-04 9/20 02/02/2018 11-03-18 08-04-18 04/04/18 04/04/2017   THCU - - - - - - -       Recent Lab results:  GENERAL CHEMISTRY   Recent Labs     04/14/19  1903 04/13/19  1623 03/05/19  1620   NA 139 137 139   K 4.2 4.7 4.8   CL 105 104 104   CO2 '20 22 25   ' GAP '14 11 10   ' UN '8 9 10   ' CREAT 0.81 0.98* 0.94   GFRC 88 70 74   GFRB 101 81 85   GLU 94 95 84   CA 9.7 9.8 9.5      LIPID PROFILE   Recent Labs     10/29/19  1444   CHOL 124   TRIG 91   HDL 38*   LDLC 68      LIVER PROFILE   Recent Labs     04/14/19  1903 04/13/19  1623   ALT 13 14   AST 12 14   ALK 103 115*   TB 0.3 0.5      DIABETES THYROID   Recent Labs     11/01/19  1739 07/28/19  1500   HA1C 6.3* 6.4*    No value within the past 365 days      Pending/Orders Labs:  Lab Frequency Next Occurrence   TSH Once 12/02/2019   Microalbumin, Urine, Random Once 12/02/2019   Lipid Panel (Reflex to Direct  LDL if Triglycerides more than 400) Once 12/02/2019   Comprehensive metabolic panel Once 76/28/3151   CBC and differential Once 12/02/2019   Hemoglobin A1c Every 3 months + PRN 12/13/2019, 12/20/2019, 12/27/2019, 01/03/2020, 01/10/2020         Patient Name: Kaylee Harris   Birth Date: [birth year]   Address: [place], [place] [address]   Sex: Female   Rx Written Rx Dispensed Drug Quantity Days Supply Prescriber Name Prescriber Dea # Payment Method Dispenser   11/11/2019 11/18/2019 tramadol hcl 50 mg tablet  42 [place], [place]. #19   09/22/2019  09/27/2019 tramadol hcl 50 mg tablet  42 14 Graceann Congress [id number] [place]. #19   07/28/2019 08/02/2019 tramadol hcl 50 mg tablet  42 14 Reginold Agent [id number] [place]. #19   07/13/2019 07/13/2019 hydrocodone-acetaminophen 5-325 mg tablet  30 5 Melody Comas DMD [id number] Insurance [place]. #02   06/24/2019 06/28/2019 tramadol hcl 50 mg tablet  42 14 Reginold Agent [id number] [place]. #19   06/11/2019 06/11/2019 oxycodone hcl 5 mg tablet  6 3 Rene Kocher, Mirian Mo MD [id number] Insurance [place]. #02   05/12/2019 05/19/2019 tramadol hcl 50 mg tablet  42 14 Graceann Congress [id number] [place]. #19   04/10/2019 04/13/2019 tramadol hcl 50 mg tablet  55 14 [name], Darnell Level (MD) [id number]  [place] #19   02/26/2019 03/02/2019 clonazepam 0.5 mg tablet  28 28 [place], [place] #19   02/25/2019 02/26/2019 diphenoxylate-atropine 2.5-0.025 mg tablet  240 30 [place], [place] #19   02/25/2019 02/25/2019 tramadol hcl 50 mg tablet  42 14 Graceann Congress [id number] [place] #19   01/26/2019 01/29/2019 tramadol hcl 50 mg tablet  42 14 Graceann Congress [id number] [place]. #19   12/18/2018 12/21/2018 tramadol hcl 50 mg tablet  42 14 [name], Darnell Level (MD) [place]. [phone]

## 2019-12-08 MED ORDER — TRAMADOL HCL 50 MG PO TABS *I*
ORAL_TABLET | ORAL | 0 refills | Status: DC
Start: 2019-12-08 — End: 2019-12-13

## 2019-12-12 ENCOUNTER — Other Ambulatory Visit: Payer: Self-pay | Admitting: Family Medicine

## 2019-12-13 ENCOUNTER — Ambulatory Visit: Payer: Medicare (Managed Care) | Attending: Psychiatry

## 2019-12-13 ENCOUNTER — Other Ambulatory Visit: Payer: Self-pay

## 2019-12-13 DIAGNOSIS — F331 Major depressive disorder, recurrent, moderate: Secondary | ICD-10-CM

## 2019-12-13 DIAGNOSIS — Z760 Encounter for issue of repeat prescription: Secondary | ICD-10-CM

## 2019-12-13 DIAGNOSIS — Z7189 Other specified counseling: Secondary | ICD-10-CM | POA: Insufficient documentation

## 2019-12-13 DIAGNOSIS — F4312 Post-traumatic stress disorder, chronic: Secondary | ICD-10-CM | POA: Insufficient documentation

## 2019-12-13 DIAGNOSIS — F422 Mixed obsessional thoughts and acts: Secondary | ICD-10-CM

## 2019-12-13 NOTE — Telephone Encounter (Signed)
Please complete I-stop and forward to the doctor.      Last office visit:   10/25/2019  Patients upcoming appointments:  Future Appointments   Date Time Provider [place]   12/13/2019  3:00 PM Tresa Res, LCSW-R CBA None   12/23/2019 11:00 AM Earl Gala, MD DRK None   03/06/2020 10:40 AM Tonette Bihari, MD HEC None   04/25/2020  3:00 PM [name], MD CAF None   08/03/2020  2:00 PM [name], MD CAF None       Recent Lab Values 04/14/2019 01/04/2018 12/17/2017 10/17/2017 10/17/2017 03/31/2017 10/29/2016   EXP DATE 2019-08-04 9/20 02/02/2018 11-03-18 08-04-18 04/04/18 04/04/2017   THCU - - - - - - -       Recent Lab results:  GENERAL CHEMISTRY   Recent Labs     04/14/19  1903 04/13/19  1623 03/05/19  1620   NA 139 137 139   K 4.2 4.7 4.8   CL 105 104 104   CO2 _0 GAP _1 UN _2 CREAT 0.81 0.98* 0.94   GFRC 88 70 74   GFRB 101 81 85   GLU 94 95 84   CA 9.7 9.8 9.5      LIPID PROFILE   Recent Labs     10/29/19  1444   CHOL 124   TRIG 91   HDL 38*   LDLC 68      LIVER PROFILE   Recent Labs     04/14/19  1903 04/13/19  1623   ALT 13 14   AST 12 14   ALK 103 115*   TB 0.3 0.5      DIABETES THYROID   Recent Labs     11/01/19  1739 07/28/19  1500   HA1C 6.3* 6.4*    No value within the past 365 days      Pending/Orders Labs:  Lab Frequency Next Occurrence   TSH Once 12/02/2019   Microalbumin, Urine, Random Once 12/02/2019   Lipid Panel (Reflex to Direct  LDL if Triglycerides more than 400) Once 12/02/2019   Comprehensive metabolic panel Once 38/33/3832   CBC and differential Once 12/02/2019   Hemoglobin A1c Every 3 months + PRN 12/13/2019, 12/20/2019, 12/27/2019, 01/03/2020, 01/10/2020

## 2019-12-13 NOTE — Telephone Encounter (Signed)
Last refill: 218/2021, disp 1 with 1 ref      Last routine appointment: 10/25/2019      Next scheduled appointment: 04/25/2020

## 2019-12-13 NOTE — Telephone Encounter (Signed)
Confidential Drug Report  Search Terms: Heydi Swango, 04/02/1976   Search Date: 12/13/2019 13:52:52 PM   Searching on behalf of: [id number] - [name]   The Drug Utilization Report below displays all of the controlled substance prescriptions, if any, that your patient has filled in the last twelve months. The information displayed on this report is compiled from pharmacy submissions to the Department, and accurately reflects the information as submitted by the pharmacies.  This report was requested by: Maretta Bees   Reference #: [id number]   Others' Prescriptions  Patient Name: Kaylee Harris   Birth Date: 1975-08-19   Address: [place], [place] [address]   Sex: Female   Rx Written Rx Dispensed Drug Quantity Days Supply Prescriber Name Prescriber [place] # Payment Method Dispenser   11/11/2019 11/18/2019 tramadol hcl 50 mg tablet  42 [place], [place]. #19   09/22/2019 09/27/2019 tramadol hcl 50 mg tablet  42 14 Graceann Congress [id number] [place]. #19   07/28/2019 08/02/2019 tramadol hcl 50 mg tablet  42 14 Reginold Agent [id number] [place]. #19   07/13/2019 07/13/2019 hydrocodone-acetaminophen 5-325 mg tablet  30 5 Melody Comas DMD [place]. #02   06/24/2019 06/28/2019 tramadol hcl 50 mg tablet  42 14 Reginold Agent [id number] [place]. #19   06/11/2019 06/11/2019 oxycodone hcl 5 mg tablet  6 3 Rene Kocher, Mirian Mo MD [id number] Insurance [place] #02   05/12/2019 05/19/2019 tramadol hcl 50 mg tablet  42 14 Graceann Congress [id number] [place]. #19   04/10/2019 04/13/2019 tramadol hcl 50 mg tablet  64 14 [name], Darnell Level (MD) [place]. #19   02/26/2019 03/02/2019 clonazepam 0.5 mg tablet  28 28 Graceann Congress [id number] [place]. #19    02/25/2019 02/26/2019 diphenoxylate-atropine 2.5-0.025 mg tablet  240 30 Graceann Congress [id number] [place]. #19   02/25/2019 02/25/2019 tramadol hcl 50 mg tablet  42 14 Graceann Congress [id number] [place]. #19   01/26/2019 01/29/2019 tramadol hcl 50 mg tablet  42 14 Graceann Congress [id number] [place]. #19   12/18/2018 12/21/2018 tramadol hcl 50 mg tablet  42 14 [name], Darnell Level (MD) [place]. #19

## 2019-12-14 NOTE — Progress Notes (Signed)
Behavioral Health Progress Note   Length of Session: 45 minutes    Contact Type:  Location: On Site    Face to Face     Problem(s)/Goals Addressed from Treatment Plan:    Problem 1:   Treatment Problem #1 07/21/2019   Patient Identified Problem Bereavement       Goal for this problem:    Treatment Goal #1 07/21/2019   Patient Identified Goal Process grief stages       Progress towards this goal: Patient talked about ongoing stressors.    Mental Status Exam:  APPEARANCE: Appears stated age, Casual  ATTITUDE TOWARD INTERVIEWER: Cooperative  MOTOR ACTIVITY: WNL (within normal limits)  EYE CONTACT: Direct  SPEECH: Normal rate and tone  AFFECT: Full Range  MOOD: Anxious, Normal and Sad  THOUGHT PROCESS: Normal  THOUGHT CONTENT: No unusual themes  PERCEPTION: No evidence of hallucinations  CURRENT SUICIDAL IDEATION: patient denies  CURRENT HOMICIDAL IDEATION: Patient denies  ORIENTATION: Alert and Oriented X 3.  CONCENTRATION: Good  MEMORY:   Recent: intact   Remote: intact  COGNITIVE FUNCTION: Average intelligence  JUDGMENT: Intact  IMPULSE CONTROL: Good and Fair  INSIGHT: Good and Fair    Risk Assessment:  ASSESSMENT OF RISK FOR SUICIDAL BEHAVIOR  Changes in risk for suicide from baseline Formulation of Risk and/or previous intake, including newly identified risk, if any: none  Violence risk was assessed and No Change noted from baseline formulation of risk and/or previous assessment.    Session Content:: Patient talked about ongoing stressors. Patient talked about feeling continually overwhelmed by family issues. She said that she has reduced the number of times she is visiting her mother but feels guilty about this due to her brother and sister not visiting often. Patient said that she did let her brother know how she was feeling. She said that she is not able to do this with her sister yet. Patient said that she continues to work to [employer] behaviors and illness. Patient said that her father was the one who  had kept everyone in line and she feels lost without him. She said that her brother tells her that she should be over this by now. Writer provided validation and support. Writer and patient discussed patient continuing to work to set boundaries and observe her own limits.     Visit Diagnosis:      ICD-10-CM ICD-9-CM   1. Bereavement counseling  Z71.89 V65.40   2. Chronic post-traumatic stress disorder (PTSD)  F43.12 309.81   3. Mixed obsessional thoughts and acts  F42.2 300.3   4. MDD (major depressive disorder), recurrent episode, moderate  F33.1 296.32       Interventions:  Bereavement Therapy  Supportive Psychotherapy  Taught/practiced coping skills (specify skills used):  Mindfulness, breathing, grounding  Taught/practiced communication skills (specify skills used):  Interpersonal, observe limits, setting boundaries.    Current Treatment Plan   Created/Updated On 07/21/2019   Next Treatment Plan Due 01/20/2020     Plan:  Psychotherapy continues as described in care plan; plan remains the same.    NEXT APPT: 12/29/19.    Tresa Res, LCSW-R

## 2019-12-15 ENCOUNTER — Other Ambulatory Visit: Payer: Self-pay

## 2019-12-15 ENCOUNTER — Encounter: Payer: Self-pay | Admitting: Primary Care

## 2019-12-15 DIAGNOSIS — Z760 Encounter for issue of repeat prescription: Secondary | ICD-10-CM

## 2019-12-15 DIAGNOSIS — E1165 Type 2 diabetes mellitus with hyperglycemia: Secondary | ICD-10-CM

## 2019-12-15 MED ORDER — OMNIPOD DASH PODS (GEN 4) MISC
1.0000 | 3 refills | Status: DC | PRN
Start: 2019-12-15 — End: 2020-12-26

## 2019-12-15 MED ORDER — TRAMADOL HCL 50 MG PO TABS *I*
ORAL_TABLET | ORAL | 0 refills | Status: DC
Start: 2019-12-15 — End: 2020-02-21

## 2019-12-15 NOTE — Telephone Encounter (Signed)
Resend please , per pt was sent to the wrong pharmacy            Last office visit:   10/25/2019  Patients upcoming appointments:  Future Appointments   Date Time Provider [place]   12/23/2019 11:00 AM Earl Gala, MD DRK None   12/29/2019  3:00 PM Tresa Res, LCSW-R CBA None   03/06/2020 10:40 AM Tonette Bihari, MD HEC None   04/25/2020  3:00 PM [name], MD CAF None   08/03/2020  2:00 PM [name], MD CAF None       Recent Lab Values 04/14/2019 01/04/2018 12/17/2017 10/17/2017 10/17/2017 03/31/2017 10/29/2016   EXP DATE 2019-08-04 9/20 02/02/2018 11-03-18 08-04-18 04/04/18 04/04/2017   THCU - - - - - - -       Recent Lab results:  GENERAL CHEMISTRY   Recent Labs     04/14/19  1903 04/13/19  1623 03/05/19  1620   NA 139 137 139   K 4.2 4.7 4.8   CL 105 104 104   CO2 _0 GAP _1 UN _2 CREAT 0.81 0.98* 0.94   GFRC 88 70 74   GFRB 101 81 85   GLU 94 95 84   CA 9.7 9.8 9.5      LIPID PROFILE   Recent Labs     10/29/19  1444   CHOL 124   TRIG 91   HDL 38*   LDLC 68      LIVER PROFILE   Recent Labs     04/14/19  1903 04/13/19  1623   ALT 13 14   AST 12 14   ALK 103 115*   TB 0.3 0.5      DIABETES THYROID   Recent Labs     11/01/19  1739 07/28/19  1500   HA1C 6.3* 6.4*    No value within the past 365 days      Pending/Orders Labs:  Lab Frequency Next Occurrence   TSH Once 12/02/2019   Microalbumin, Urine, Random Once 12/02/2019   Lipid Panel (Reflex to Direct  LDL if Triglycerides more than 400) Once 12/02/2019   Comprehensive metabolic panel Once 46/43/1427   CBC and differential Once 12/02/2019   Hemoglobin A1c Every 3 months + PRN 12/20/2019, 12/27/2019, 01/03/2020, 01/10/2020

## 2019-12-16 ENCOUNTER — Encounter: Payer: Self-pay | Admitting: Primary Care

## 2019-12-16 NOTE — Telephone Encounter (Addendum)
Tramadol last dispensed on 8/9 for 14 day supply. Confirmed this with [facility]. Can be next filled on 8/24.   Trulicity is filled through [facility].   Default pharmacy has been corrected to be [facility].

## 2019-12-16 NOTE — Telephone Encounter (Signed)
Confidential Drug Report  Search Terms: Alijah Hyde, Apr 22, 1976   Search Date: 12/16/2019 12:29:04 PM   The Drug Utilization Report below displays all of the controlled substance prescriptions, if any, that your patient has filled in the last twelve months. The information displayed on this report is compiled from pharmacy submissions to the Department, and accurately reflects the information as submitted by the pharmacies.  This report was requested by: Grayce Sessions   Reference #: [id number]   My Prescriptions  Patient Name: Kaylee Harris   Birth Date: 10-05-1975   Address: [address] [place], [place] [address]   Sex: Female   Rx Written Rx Dispensed Drug Quantity Days Supply Prescriber Name Prescriber [place] # Payment Method Dispenser   07/28/2019 08/02/2019 tramadol hcl 50 mg tablet  42 [place], [place] [id number] [place]. #19   06/24/2019 06/28/2019 tramadol hcl 50 mg tablet  42 [place], [place] [id number] [place]. #19   Others' Prescriptions  Patient Name: Elizabeth Paulsen   Birth Date: 10-04-1975   Address: [place],  [address]   Sex: Female   Rx Written Rx Dispensed Drug Quantity Days Supply Prescriber Name Prescriber Dea # Payment Method Dispenser   12/08/2019 12/13/2019 tramadol hcl 50 mg tablet  42 [place], [place]. #02   11/11/2019 11/18/2019 tramadol hcl 50 mg tablet  42 14 Graceann Congress [id number] [place]. #19   09/22/2019 09/27/2019 tramadol hcl 50 mg tablet  42 14 Graceann Congress [id number] [place]. #19   07/13/2019 07/13/2019 hydrocodone-acetaminophen 5-325 mg tablet  30 5 Melody Comas DMD [place]. #02   06/11/2019 06/11/2019 oxycodone hcl 5 mg tablet  6 3 Josph Macho MD [id number] Insurance [place] #02   05/12/2019 05/19/2019 tramadol hcl 50 mg tablet  42 14 Graceann Congress [id number] [place]. #19   04/10/2019 04/13/2019 tramadol hcl 50 mg tablet  70 14 [name], Darnell Level (MD) [place]. #19   02/26/2019 03/02/2019 clonazepam 0.5 mg tablet  28 28 Graceann Congress [id number] [place]. #19   02/25/2019 02/26/2019 diphenoxylate-atropine 2.5-0.025 mg tablet  240 30 Graceann Congress [id number] [place]. #19   02/25/2019 02/25/2019 tramadol hcl 50 mg tablet  42 14 Graceann Congress [id number] [place] #19   01/26/2019 01/29/2019 tramadol hcl 50 mg tablet  42 14 Graceann Congress [id number] [place]. #19   12/18/2018 12/21/2018 tramadol hcl 50 mg tablet  16 14 [name], Darnell Level (MD) [place]. #19   * - Drugs marked with an asterisk are compound drugs. If the compound drug is made up of more than one controlled substance, then each controlled substance will be a separate row in the table.   [place]

## 2019-12-17 ENCOUNTER — Encounter: Payer: Self-pay | Admitting: Primary Care

## 2019-12-17 ENCOUNTER — Ambulatory Visit: Payer: Medicare (Managed Care) | Admitting: Primary Care

## 2019-12-17 VITALS — Ht 67.99 in | Wt 256.0 lb

## 2019-12-17 DIAGNOSIS — F319 Bipolar disorder, unspecified: Secondary | ICD-10-CM

## 2019-12-17 DIAGNOSIS — M797 Fibromyalgia: Secondary | ICD-10-CM

## 2019-12-17 DIAGNOSIS — F603 Borderline personality disorder: Secondary | ICD-10-CM

## 2019-12-17 DIAGNOSIS — Z9189 Other specified personal risk factors, not elsewhere classified: Secondary | ICD-10-CM

## 2019-12-17 MED ORDER — LAMOTRIGINE 200 MG PO TABS *I*
200.0000 mg | ORAL_TABLET | Freq: Every day | ORAL | 1 refills | Status: DC
Start: 2019-12-17 — End: 2019-12-28

## 2019-12-17 MED ORDER — DULOXETINE HCL 60 MG PO CPEP *I*
60.0000 mg | DELAYED_RELEASE_CAPSULE | Freq: Every day | ORAL | 1 refills | Status: DC
Start: 2019-12-17 — End: 2020-07-03

## 2019-12-17 NOTE — Progress Notes (Signed)
[facility]      TeleHome Video Encounter   Location of Patient: home  Location of Telemedicine Provider: hospital / clinical location  Other participants in telemedicine encounter and roles: no other participants.  Reason for visit: Bipolar Disorder (phq done verbally)  Patient's problem list, allergies, and medications were reviewed and updated as appropriate. Please see the EHR for full details.  Consent was obtained from the patient to complete this video visit; including the potential for financial liability.                SUBJECTIVE    Pt is here to discuss:    Chief Complaint   Patient presents with    Bipolar Disorder     phq done verbally         1. Fu anxiety and depression - sent mychart message today:    Dr.Jowanda Heeg   I need to have my medication looked at. My behavior health meds. The personality disorder and the bipolar 1 disorder. Depression I have been having a real hard time since dad passing then mom get sick and hurt. Shari's illness getting worse. My own health not doing good. There are many days [name] can not even Totch me I'm so sore. Can't sleep I don't no how much more I can take. Talked to SW about that. Beyond stressed everyone thing I can do it all. Take care of myself I don't even know how anymore. To worried about everyone else. Yes those thoughts have come back told SW. No thought of acting on them but I get scared with the impulse with the bipolar.       Stressors with wife and her advancing mult sclerosis and the effect on her cognitive abilities/memory. Mom recently fell and fractured cspine, has been in hospital and rehab for ~35mo Returned home today. 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 She feels that her own illnesses prevent her from doing this to their full expectations however, and that they do not understand this.     Has had increasing diffuse body pains - back,  ankles; itching; insomnia due to the pain; increasing depression and anxiety. Admits to thoughts that she doesn't want to continue living, and scared that she will have an impulse to act on this (has h/o doing this in the past).     SA with OD 04/2014, and again in 05/2014. sent to [facility] -> [facility] program (dismissed 06/2014 due to frequent absences)    Describes her past SA's were done impulsively - didn't really have a warning or ability to reach out for help. Did it when her wife was in their home, and wife initiated call to ambulance.     PMH / Family Hx / Social Hx  Patient's medications, allergies, problem list, past medical, social histories were reviewed and notable for:       H/o bipolar disorder - confirms hypomanic episodes (gambling despite not having extra money, frequent ups/downs)    Previously treated with Prozac, fluoxetine, lexapro, zoloft  SA with OD 04/2014, and again in 05/2014. sent to PHP -> [facility] program (dismissed 06/2014 due to frequent abscenses)  6/16: cogentin and latuda d/c-ed , transitioned to abilify  06/2015: abilify decreased due to uncontrolled DM    borderline personality d/o     Current Outpatient Medications   Medication Sig Note    traMADol (ULTRAM) 50 MG tablet  TAKE 1 TABLET BY MOUTH EVERY 8 HOURS AS NEEDED FOR HEAD AND ABDOMINAL PAIN MAXIMUM DAILY DOSE OF 3 TABLETS PER DAY     Insulin Disposable Pump (OMNIPOD DASH 5 PACK PODS) MISC By 1 device no specified route as needed Change every 2 days = 45 pods for 90 days supply = 9 (5packs)     albuterol HFA (PROVENTIL, VENTOLIN, PROAIR HFA) 108 (90 Base) MCG/ACT inhaler INHALE 1 TO 2 PUFFS BY MOUTH EVERY 6 HOURS AS NEEDED FOR WHEEZING - SHAKE WELL BEFORE EACH USE     SUMAtriptan (IMITREX) 50 MG tablet TAKE 1 TABLET BY MOUTH AS NEEDED FOR MIGRAINE, TAKE AT ONSET OF HEADACHE, MAY REPEAT ONCE IN 2 HOURS     cetirizine (ZYRTEC) 10 MG tablet Take 1 tablet (10 mg total) by mouth 2 times daily     DULoxetine (CYMBALTA) 30 MG DR capsule TAKE 1  CAPSULE BY MOUTH TWO TIMES DAILY     promethazine (PHENERGAN) 25 MG tablet Take 1 tablet (25 mg total) by mouth every 4-6 hours as needed     triamcinolone (KENALOG) 0.5 % cream Apply topically 2 times daily to the following areas: arms     dulaglutide (TRULICITY) 2.20 UR/4.2HC pen Inject 0.5 mLs (0.75 mg total) into the skin every 7 days     Alcohol Swabs (SM ALCOHOL PREP) 70 % PADS USE 1 PAD TOPICALLY TWO TIMES DAILY TO CHECK BLOOD GLUCOSE     clindamycin (CLEOCIN T) 1 % external solution APPLY TOPICALLY TWO TIMES DAILY, CONTINUE UNTIL IMPROVEMENT IN CYSTS     dicyclomine (BENTYL) 10 MG capsule Take 1 capsule (10 mg total) by mouth 4 times daily (before meals and nightly) TAKE 1 CAPSULE BY MOUTH FOUR TIMES DAILY BEFORE MEALS AND NIGHTLY     ascorbic acid (VITAMIN C) 100 MG tablet Take 5 tablets (500 mg total) by mouth daily     Continuous Blood Gluc Transmit (DEXCOM G6 TRANSMITTER) Attach to sensor. Change every 90 days     Continuous Blood Gluc Sensor (DEXCOM G6 SENSOR) Inject into the skin as needed (change every 10 days)     fluticasone (FLONASE) 50 MCG/ACT nasal spray Spray 1 spray into nostril daily     Continuous Blood Gluc Receiver (DEXCOM G6 RECEIVER) By no specified route as needed     famotidine (PEPCID) 20 MG tablet Take 1 tablet (20 mg total) by mouth nightly as needed for Heartburn     pantoprazole (PROTONIX) 40 MG EC tablet Take 1 tablet (40 mg total) by mouth daily Swallow whole. Do not crush, break, or chew.     benzoyl peroxide 10 % LIQD external liquid Apply topically 2 times daily to the following areas: under arms, under breast, groin     LIDOCAINE HCL URETHRAL/MUCOSAL 2 % jelly APPLY TOPICALLY THREE TIMES DAILY AS NEEDED FOR PAIN     insulin lispro 100 UNIT/ML injection vial Inject as directed via pump up to Maximum  60  Units/day     busPIRone (BUSPAR) 30 MG tablet Take 1 tablet (30 mg total) by mouth 2 times daily     fluticasone (FLOVENT HFA) 220 MCG/ACT inhaler Inhale 1  puff into the lungs daily Shake well before each use.     lamoTRIgine (LAMICTAL) 150 MG tablet TAKE 1 TABLET BY MOUTH EVERY DAY     topiramate (TOPAMAX) 100 MG tablet Take 1 tablet (100 mg total) by mouth 2 times daily     clonazePAM (KLONOPIN) 0.5 MG tablet Take 1  tablet (0.5 mg total) by mouth daily as needed Max daily dose: 0.5 mg     insulin syringe-needle U-100 (BD INSULIN SYRINGE U/F) 31G X 5/16" 0.3 ML HALF-UNIT USE THREE TIMES DAILY AS INSTRUCTED     diphenoxylate-atropine (LOMOTIL) 2.5-0.025 MG per tablet Take 1-2 tablets by mouth 4 times daily as needed for Diarrhea Max daily dose: 8 tablets Code D     QUEtiapine (SEROQUEL XR) 50 MG 24 hr tablet Take 1 tablet (50 mg total) by mouth nightly Swallow whole. Do not chew, crush, or break.     blood pressure monitor, automatic with arm cuff Use as directed to monitor blood pressure     incontinence supply disposable Order Description:  Wear as needed for stool leakage     zinc oxide 20 % ointment Apply topically as needed for Dry Skin     clotrimazole (LOTRIMIN) 1 % cream Apply topically 2 times daily     Continuous Blood Gluc Receiver (DEXCOM G6 RECEIVER) DEVI By 1 Device no specified route as needed     famciclovir (FAMVIR) 500 MG tablet Take 1 tablet (500 mg total) by mouth 3 times daily as needed     glucagon (BAQSIMI) 3 MG/DOSE nasal powder Inhale one dose (3 mg) into one nostril once as needed for low blood sugar. If no response in 15 min, inhale second dose from new device     blood glucose test strip Test  8 times a day.   Brand name of strips Contour NEXT test strips for linked omnipod pump     lancets (BAYER MICROLET) Use   8  times per day as instructed for blood glucose testing.     azelastine (OPTIVAR) 0.05 % ophthalmic solution Place 1 drop into both eyes 2 times daily     SUMAtriptan refill (IMITREX STATDOSE) 6 MG/0.5ML injection INJECT 0.5ML UNDER THE SKIN ONCE AS NEEDED FOR MIGRAINE. MAY REPEAT DOSE ONCE AFTER 1 HOUR      glucagon (GLUCAGEN) 1 MG injection kit Inject 1 mg into the skin once as needed for Low blood sugar   Discard any unused portion.     atorvastatin (LIPITOR) 40 MG tablet Take 1 tablet (40 mg total) by mouth daily (with dinner)     metoprolol (TOPROL-XL) 25 MG 24 hr tablet Take by mouth daily    06/10/2016: Received from: External Pharmacy    midodrine (PROAMATINE) 10 MG tablet Take 1 tablet (10 mg total) by mouth 3 times daily     Non-System Medication The above patient is followed in our clinic and cannot resume work permanently.           OBJECTIVE  Vitals:    12/17/19 1425   Weight: 116.1 kg (256 lb)   Height: 1.727 m (5' 7.99")     Body mass index is 38.94 kg/m.      General: well-appearing Caucasian female, pleasant & conversant, in NAD   Psych: AAOx3, normal affect and mood. Insight and judgement intact.     Recent Review Flowsheet Data     PHQ Scores 12/17/2019 10/25/2019 08/03/2019 10/29/2018 10/20/2017 09/30/2017 08/14/2017    PSQ2 Q1 - Interest/Pleasure Y Y N N N - -    PSQ2 Q2 - Down, Depressed, Hopeless Y Y - N N - -    PHQ Q9 - Better Off Dead 1 0 - - - 0 0    PHQ Calculated Score 14 14 - - - 7 4    PHQ Manual Score 14 - - - - - -  GAD-7 Dates 10/25/2019 09/30/2017 08/14/2017 06/09/2017 05/22/2017 02/24/2017 01/20/2017   Total Score _0 ASSESSMENT & PLAN  1. Borderline personality disorder  2. Bipolar 1 disorder  - lamoTRIgine (LAMICTAL) 200 MG tablet; Take 1 tablet (200 mg total) by mouth daily Replacing 149m dose  Dispense: 90 tablet; Refill: 1  - DULoxetine (CYMBALTA) 60 MG DR capsule; Take 1 capsule (60 mg total) by mouth daily Replacing 3101mdose  Dispense: 90 capsule; Refill: 1   3. Fibromyalgia    Acute flare of symptoms related to stressors of mother's recent hospitalization, wife's illness, father's death last year. Pt concerned about increased SI and impulsive OD, especially given past history of doing so.    Discussed safety plan- recommended she stay with wife at all times,  and that wife initiate call if any concern for acute impulsive OD or SA. Also recommended calling our office if increasing ideation or planning. Will change medication today and patient already involved with therapist and she has disclosed these thoughts and acute flare in symptoms. Reviewed if outpatient medication and therapy is not effective, we could consider PHP referral. Pt declines this at this time    Recommended boundary setting with caring for her mom - if/when mom has aide services recommended pt go home and rest. Also offered to discuss pt's limitations with her mother, who is also my patient. Pt agreeable to this.    Will increase cymbalta dose (pt only taking 303maily at this time despite MAR saying 35m58mD. Will increase it to 60mg63mly. Given her concern for impulsivity, will increase lamictal dose as well for bipolar control.       Follow-up: 3-4 weeks      JilliJohny Drilling [place]  12/17/2019  2:49 PM        ______________________

## 2019-12-17 NOTE — Telephone Encounter (Signed)
I called the patient and left a message asking them to give our office a call back. Please see the message below.

## 2019-12-17 NOTE — Telephone Encounter (Signed)
Please offer pt appt today for one of my same days re: bipolar/fibro    [name], MD  [place]  12/17/2019  9:51 AM

## 2019-12-21 ENCOUNTER — Telehealth: Payer: Self-pay

## 2019-12-21 NOTE — Progress Notes (Signed)
Objective/Intervention brought up by: CM and client discussed upcoming appointments for mental health.   Client/provider identified progress/barriers since last discussed: Client struggles at times keeping her appointments.  Additional Notes regarding this issue: CM asked client if they could meet to review and update current plan of care before her next appointment with her therapist at [facility] on [address] on 8/25. Client stated that she needed to cancel the appointment.   Care Manager actions taken during this contact to reduce barriers/promote progress: CM informed client next appointment with therapist Aline Brochure at [facility] is scheduled for 8/25 at 3 pm. Client then stated she needed to cancel and could not make that appointment. CM then encouraged client to reschedule the appointment instead of just cancelling.   Follow Up Plan: Next appointment is rescheduled for 9/8 and CM and client will meet prior to her [place] appointment at the clinic to review and update and sign POC.

## 2019-12-21 NOTE — Telephone Encounter (Signed)
Patients 8/25 in person appt at 3:00pm with Tresa Res has been rescheduled to 9/8 in person at 3:00pm due to double booked another appt as patient stated [phone]

## 2019-12-23 ENCOUNTER — Ambulatory Visit: Payer: Medicare (Managed Care) | Admitting: Dermatology

## 2019-12-23 ENCOUNTER — Other Ambulatory Visit
Admission: RE | Admit: 2019-12-23 | Discharge: 2019-12-23 | Disposition: A | Discharge status: Home / Self Care | Payer: Medicare (Managed Care) | Source: Ambulatory Visit | Attending: Dermatology | Admitting: Dermatology

## 2019-12-23 VITALS — Temp 97.2°F | Ht 67.99 in | Wt 244.0 lb

## 2019-12-23 DIAGNOSIS — H02823 Cysts of right eye, unspecified eyelid: Secondary | ICD-10-CM

## 2019-12-23 DIAGNOSIS — R21 Rash and other nonspecific skin eruption: Secondary | ICD-10-CM

## 2019-12-23 LAB — CBC AND DIFFERENTIAL
Baso # K/uL: 0 10*3/uL (ref 0.0–0.1)
Basophil %: 0.3 %
Eos # K/uL: 0.1 10*3/uL (ref 0.0–0.4)
Eosinophil %: 1.2 %
Hematocrit: 41 % (ref 34–45)
Hemoglobin: 13.5 g/dL (ref 11.2–15.7)
Lymph # K/uL: 2.9 10*3/uL (ref 1.2–3.7)
Lymphocyte %: 25.6 %
MCH: 29 pg (ref 26–32)
MCHC: 33 g/dL (ref 32–36)
MCV: 88 fL (ref 79–95)
Mono # K/uL: 0.7 10*3/uL (ref 0.2–0.9)
Monocyte %: 6.2 %
Neut # K/uL: 7.7 10*3/uL — ABNORMAL HIGH (ref 1.6–6.1)
Platelets: 388 10*3/uL — ABNORMAL HIGH (ref 160–370)
RBC: 4.7 MIL/uL (ref 3.9–5.2)
RDW: 13.6 % (ref 11.7–14.4)
Seg Neut %: 66.7 %
WBC: 11.5 10*3/uL — ABNORMAL HIGH (ref 4.0–10.0)

## 2019-12-23 LAB — SEDIMENTATION RATE, AUTOMATED: Sedimentation Rate: 27 mm/hr — ABNORMAL HIGH (ref 0–20)

## 2019-12-23 NOTE — Progress Notes (Signed)
Dermatology Progress Note    Chief Complaint   Patient presents with   • Follow-up     spots of concern        Derm History and Relevant Medical History:  - HS for 25 years    HPI:   Kaylee Harris is a pleasant 43 y.o. female with POTS and fibromyalgia here for the concern noted below.  ?  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 Patient was instructed to continue clindamycin lotion to the affected areas 1-2 times daily, start benzoyl peroxide 5-10% wash and chlorhexidine wash daily.     Concern: Patient is here today for itchy/burning skin, redness/spots to the face and arms. Patient states the red spots are intermittent and travel to different areas and has been going on for a couple of months. She states the heat worsens the redness spots. She denies any fevers, but she does experience occasional chills. Patient has fibromyalgia, but she described the pain to be localized to her bones. Her cheeks are intermittently red and burning     Physical Exam:   Vitals:    12/23/19 1120   Temp: 36.2 °C (97.2 °F)   Weight: 110.7 kg (244 lb)   Height: 1.727 m (5' 7.99")     Skin: All of the following were examined, and were within normal limits, except as noted: Face  - 2 milia to the right eye  -skin clear today but picture shows red macules on arms and red cheeks    Assessment/Plan:    Milia, on right eye  - Milia are small cysts that appear like a white bump on the skin. They are benign and no treatment is necessary.   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   -Discussed that she has known POTS which can present with a variety of rashes including urticaria and erythromelalgia - I think this is the likelier explanation. Pt amenable to this.     Return to clinic: PRN    “I Sabrina Xaisanasy, am scribing for and in the presence of Dr. ”  12/23/2019 11:31 AM    I, Dr.  , MD, personally performed the services described in this documentation, as scribed by Sabrina Xaisanasy in my presence, and it is accurate and complete.  12/23/19 12:54 PM

## 2019-12-23 NOTE — Patient Instructions (Addendum)
Milia, on right eye  - Milia are small cysts that appear like a white bump on the skin. They are benign and no treatment is necessary.   - 2 were extracted with 11 blade and cotton top applicators    Rash of unknown cause: DDx: Schnitzler's syndrome vs POTS rash  - Discussed possible diagnoses   - Discussed to complete blood work to rule out Schnitzler's syndrome

## 2019-12-23 NOTE — Progress Notes (Signed)
Participants involved in review of Plan: CM Thelma Barge and Client Kaylee Harris reviewed and updated plan of care together over the phone.   Progress towards Goals: CM has made progress with maintaining behavioral health goal and utilizing community resources the best she can.   Changes to Plan: CM and client agreed to add a physical health objective to her plan of care to confront her chronic pain. CM and client will work together communicating needs to her medical providers and exploring resources available for pain management.   Updates to Clients Strengths/Preferences: no updates.   Updates to Barriers: the health of client's spouse has increased stress for client.   Client agreement to changes: verbal agreement to updates in plan of care, CM and client plan to meet in [facility] weeks at [facility] on [address] prior to her scheduled therapy appointment on 9/8 to review and sign plan of care.   Plan to provide copy to client/providers: via mail per request

## 2019-12-27 LAB — PROTEIN ELECTROPHORESIS, SERUM
A/G Ratio: 1.4 (ref 0.9–1.8)
Albumin: 4 g/dL (ref 3.5–5.1)
Alpha 1: 0.4 g/dL (ref 0.2–0.4)
Alpha 2: 0.8 g/dL (ref 0.4–0.9)
Beta: 0.9 g/dL (ref 0.5–1.0)
Gamma: 0.9 g/dL (ref 0.7–1.4)
Interp,PE: NORMAL
Total Protein: 6.9 g/dL (ref 6.3–7.7)

## 2019-12-27 LAB — PE ELECT,REVIEW

## 2019-12-28 ENCOUNTER — Other Ambulatory Visit: Payer: Self-pay

## 2019-12-28 DIAGNOSIS — F603 Borderline personality disorder: Secondary | ICD-10-CM

## 2019-12-28 DIAGNOSIS — F319 Bipolar disorder, unspecified: Secondary | ICD-10-CM

## 2019-12-28 NOTE — Telephone Encounter (Signed)
Refilled 12/17/19 to [facility] pharmacy

## 2019-12-28 NOTE — Telephone Encounter (Signed)
Last office visit:   10/25/2019  Patients upcoming appointments:  Future Appointments   Date Time Provider [place]   01/12/2020  3:00 PM Tresa Res, LCSW-R CBA None   03/06/2020 10:40 AM Tonette Bihari, MD HEC None   04/25/2020  3:00 PM [name], MD CAF None   08/03/2020  2:00 PM [name], MD CAF None       Recent Lab Values 04/14/2019 01/04/2018 12/17/2017 10/17/2017 10/17/2017 03/31/2017 10/29/2016   EXP DATE 2019-08-04 9/20 02/02/2018 11-03-18 08-04-18 04/04/18 04/04/2017   [facility]CU - - - - - - -       Recent Lab results:  GENERAL CHEMISTRY   Recent Labs     04/14/19  1903 04/13/19  1623 03/05/19  1620   NA 139 137 139   K 4.2 4.7 4.8   CL 105 104 104   CO2 _0 GAP _1 UN _2 CREAT 0.81 0.98* 0.94   GFRC 88 70 74   GFRB 101 81 85   GLU 94 95 84   CA 9.7 9.8 9.5      LIPID PROFILE   Recent Labs     10/29/19  1444   CHOL 124   TRIG 91   HDL 38*   LDLC 68      LIVER PROFILE   Recent Labs     04/14/19  1903 04/13/19  1623   ALT 13 14   AST 12 14   ALK 103 115*   TB 0.3 0.5      DIABETES THYROID   Recent Labs     11/01/19  1739 07/28/19  1500   HA1C 6.3* 6.4*    No value within the past 365 days      Pending/Orders Labs:  Lab Frequency Next Occurrence   TSH Once 12/02/2019   Microalbumin, Urine, Random Once 12/02/2019   Lipid Panel (Reflex to Direct  LDL if Triglycerides more than 400) Once 12/02/2019   Comprehensive metabolic panel Once 46/56/8127   CBC and differential Once 12/02/2019   Hemoglobin A1c Every 3 months + PRN 01/03/2020, 01/10/2020

## 2019-12-29 ENCOUNTER — Ambulatory Visit: Payer: Medicare (Managed Care)

## 2019-12-29 MED ORDER — LAMOTRIGINE 200 MG PO TABS *I*
200.0000 mg | ORAL_TABLET | Freq: Every day | ORAL | 1 refills | Status: DC
Start: 2019-12-29 — End: 2020-08-17

## 2019-12-31 ENCOUNTER — Encounter: Payer: Self-pay | Admitting: Primary Care

## 2020-01-05 NOTE — Progress Notes (Signed)
Objective/Intervention brought up by: CM contacted pcp and client to discuss pain management referral and upcoming appointments.   Client/provider identified progress/barriers since last discussed:   Additional Notes regarding this issue: CM sent an in basket message to client's pcp Dr. Laurance Flatten to discuss barrier addressed by client stating she would like to explore pain management services again.  Care Manager actions taken during this contact to reduce barriers/promote progress: CM reminded client of her upcoming appointment next week with her therapist Tresa Res on 9/8 at 3 pm and reminded client that her and CM will meet prior to the appointment to review and sign plan of care. CM then informed client that she had reached out to PCP Dr.Moore to attempt to discuss pain management barrier.  Follow Up Plan: CM will meet with client at [place] before her scheduled appointment with her therapist to review and sign updated plan of care. CM will also continue to collaborate with pcp in regards to finding client a pain management program.

## 2020-01-11 MED ORDER — DULOXETINE HCL 30 MG PO CPEP *I*
30.0000 mg | DELAYED_RELEASE_CAPSULE | Freq: Every day | ORAL | 3 refills | Status: DC
Start: 2020-01-11 — End: 2021-04-09

## 2020-01-11 NOTE — Addendum Note (Signed)
Addended by: Grandville Silos on: 01/11/2020 02:58 PM     Modules accepted: Orders

## 2020-01-12 ENCOUNTER — Ambulatory Visit: Payer: Medicare (Managed Care) | Attending: Psychiatry

## 2020-01-12 DIAGNOSIS — F422 Mixed obsessional thoughts and acts: Secondary | ICD-10-CM | POA: Insufficient documentation

## 2020-01-12 DIAGNOSIS — F4312 Post-traumatic stress disorder, chronic: Secondary | ICD-10-CM

## 2020-01-12 DIAGNOSIS — F331 Major depressive disorder, recurrent, moderate: Secondary | ICD-10-CM | POA: Insufficient documentation

## 2020-01-12 NOTE — Progress Notes (Signed)
Behavioral Health Progress Note   Length of Session: 45 minutes    Contact Type:  Location: On Site    Face to Face     Problem(s)/Goals Addressed from Treatment Plan:    Problem 1:   Treatment Problem #1 07/21/2019   Patient Identified Problem Bereavement       Goal for this problem:    Treatment Goal #1 07/21/2019   Patient Identified Goal Process grief stages       Progress towards this goal: Problem resolving. Comment: Patient reports some mood improvement.    Mental Status Exam:  APPEARANCE: Appears stated age, Casual  ATTITUDE TOWARD INTERVIEWER: Cooperative  MOTOR ACTIVITY: WNL (within normal limits)  EYE CONTACT: Direct  SPEECH: Normal rate and tone  AFFECT: Full Range  MOOD: Anxious and Normal  THOUGHT PROCESS: Normal  THOUGHT CONTENT: No unusual themes  PERCEPTION: No evidence of hallucinations  CURRENT SUICIDAL IDEATION: patient denies  CURRENT HOMICIDAL IDEATION: Patient denies  ORIENTATION: Alert and Oriented X 3.  CONCENTRATION: Good  MEMORY:   Recent: intact   Remote: intact  COGNITIVE FUNCTION: Average intelligence  JUDGMENT: Intact  IMPULSE CONTROL: Good and Fair  INSIGHT: Good and Fair    Risk Assessment:  ASSESSMENT OF RISK FOR SUICIDAL BEHAVIOR  Changes in risk for suicide from baseline Formulation of Risk and/or previous intake, including newly identified risk, if any: none  Violence risk was assessed and No Change noted from baseline formulation of risk and/or previous assessment.    Session Content:: Patient reports some mood improvement. Patient talked about family issues related to her brother and nieces. She said that her brother has cut her off from her nieces due to one niece being transgender and his girlfriend. Patient told writer about her wife's recent lack of understanding around cheese sizes. Writer asked for clarification and patient told writer she wanted to make either cubes or slices but didn't know which ones and got frustrated. Writer talked to patient about OCD, perfectionism  and patient identified other ways in which this manifests. Patient will work on reducing this behavior. Patient told Probation officer that her PCP increased some medication doses and this has helped her distress.      Visit Diagnosis:      ICD-10-CM ICD-9-CM   1. Chronic post-traumatic stress disorder (PTSD)  F43.12 309.81   2. Mixed obsessional thoughts and acts  F42.2 300.3   3. MDD (major depressive disorder), recurrent episode, moderate  F33.1 296.32       Interventions:  Bereavement Therapy  Supportive Psychotherapy  Taught/practiced coping skills (specify skills used):  Mindfulness, breathing, grounding  Taught/practiced communication skills (specify skills used):  Interpersonal, observe limits, setting boundaries.    Current Treatment Plan   Created/Updated On 07/21/2019   Next Treatment Plan Due 01/20/2020     Plan:  Psychotherapy continues as described in care plan; plan remains the same.    NEXT APPT: 02/02/20.    Tresa Res, LCSW-R

## 2020-01-13 NOTE — Progress Notes (Signed)
Objective/Intervention brought up by: CM met with client at [facility] on [address] prior to her appointment with her [facility] therapist.  Client/provider identified progress/barriers since last discussed: client informed CM that she has been having trouble getting aide services at her home and is now at risk of losing ic[employer] coverage.   Additional Notes regarding this issue: CM asked client for contact information for identified home aide service, [facility], and her case worker at [employer].   Care Manager actions taken during this contact to reduce barriers/promote progress: CM will reach out to both [facility] and [facility] to discuss barriers on getting home aide services to begin. CM and client also reviewed and signed updated plan of care to be scanned into [employer].    Follow Up Plan: CM will contact [employer] and [facility] home aide services to advocate communication for client and address barriers to getting aide services to begin.

## 2020-01-19 ENCOUNTER — Ambulatory Visit: Payer: Medicare (Managed Care)

## 2020-01-21 ENCOUNTER — Encounter: Payer: Self-pay | Admitting: Primary Care

## 2020-01-25 ENCOUNTER — Other Ambulatory Visit: Payer: Self-pay | Admitting: Family Medicine

## 2020-01-25 DIAGNOSIS — K219 Gastro-esophageal reflux disease without esophagitis: Secondary | ICD-10-CM

## 2020-01-26 ENCOUNTER — Other Ambulatory Visit: Payer: Self-pay | Admitting: Primary Care

## 2020-01-26 DIAGNOSIS — Z9109 Other allergy status, other than to drugs and biological substances: Secondary | ICD-10-CM

## 2020-01-26 NOTE — Telephone Encounter (Signed)
Last office visit:   10/25/2019  Patients upcoming appointments:  Future Appointments   Date Time Provider [place]   02/02/2020  3:00 PM Tresa Res, LCSW-R CBA None   03/06/2020 10:40 AM Tonette Bihari, MD H[facility] None   04/25/2020  3:00 PM [name], MD CAF None   08/03/2020  2:00 PM [name], MD CAF None       Recent Lab Values 04/14/2019 01/04/2018 12/17/2017 10/17/2017 10/17/2017 03/31/2017 10/29/2016   EXP DATE 2019-08-04 9/20 02/02/2018 11-03-18 08-04-18 04/04/18 04/04/2017   [facility]CU - - - - - - -       Recent Lab results:  GENERAL CHEMISTRY   Recent Labs     04/14/19  1903 04/13/19  1623 03/05/19  1620   NA 139 137 139   K 4.2 4.7 4.8   CL 105 104 104   CO2 _0 GAP _1 UN _2 CREAT 0.81 0.98* 0.94   GFRC 88 70 74   GFRB 101 81 85   GLU 94 95 84   CA 9.7 9.8 9.5      LIPID PROFILE   Recent Labs     10/29/19  1444   CHOL 124   TRIG 91   HDL 38*   LDLC 68      LIVER PROFILE   Recent Labs     04/14/19  1903 04/13/19  1623   ALT 13 14   AST 12 14   ALK 103 115*   TB 0.3 0.5      DIABETES THYROID   Recent Labs     11/01/19  1739 07/28/19  1500   HA1C 6.3* 6.4*    No value within the past 365 days      Pending/Orders Labs:  Lab Frequency Next Occurrence   TSH Once 12/02/2019   Microalbumin, Urine, Random Once 12/02/2019   Lipid Panel (Reflex to Direct  LDL if Triglycerides more than 400) Once 12/02/2019   Comprehensive metabolic panel Once 94/49/6759   CBC and differential Once 12/02/2019   Hemoglobin A1c Every 3 months + PRN

## 2020-01-27 ENCOUNTER — Other Ambulatory Visit: Payer: Self-pay

## 2020-01-27 DIAGNOSIS — F419 Anxiety disorder, unspecified: Secondary | ICD-10-CM

## 2020-01-27 MED ORDER — CETIRIZINE HCL 10 MG PO TABS *I*
10.0000 mg | ORAL_TABLET | Freq: Two times a day (BID) | ORAL | 1 refills | Status: DC
Start: 2020-01-27 — End: 2020-03-22

## 2020-01-27 NOTE — Telephone Encounter (Signed)
Last office visit:   10/25/2019  Patients upcoming appointments:  Future Appointments   Date Time Provider [place]   02/02/2020  3:00 PM Tresa Res, LCSW-R CBA None   03/06/2020 10:40 AM Tonette Bihari, MD [facility] None   04/25/2020  3:00 PM [name], MD CAF None   08/03/2020  2:00 PM [name], MD CAF None     Recent Lab results:  GENERAL CHEMISTRY   Recent Labs     04/14/19  1903 04/13/19  1623 03/05/19  1620   NA 139 137 139   K 4.2 4.7 4.8   CL 105 104 104   CO2 _0 GAP _1 UN _2 CREAT 0.81 0.98* 0.94   GFRC 88 70 74   GFRB 101 81 85   GLU 94 95 84   CA 9.7 9.8 9.5      LIPID PROFILE   Recent Labs     10/29/19  1444   CHOL 124   TRIG 91   HDL 38*   LDLC 68      LIVER PROFILE   Recent Labs     04/14/19  1903 04/13/19  1623   ALT 13 14   AST 12 14   ALK 103 115*   TB 0.3 0.5      DIABETES THYROID   Recent Labs     11/01/19  1739 07/28/19  1500   HA1C 6.3* 6.4*    No value within the past 365 days      Pending/Orders Labs:  Lab Frequency Next Occurrence   TSH Once 12/02/2019   Microalbumin, Urine, Random Once 12/02/2019   Lipid Panel (Reflex to Direct  LDL if Triglycerides more than 400) Once 12/02/2019   Comprehensive metabolic panel Once 09/73/5329   CBC and differential Once 12/02/2019   Hemoglobin A1c Every 3 months + PRN         Recent Lab Values 04/14/2019 01/04/2018 12/17/2017 10/17/2017 10/17/2017 03/31/2017 10/29/2016   EXP DATE 2019-08-04 9/20 02/02/2018 11-03-18 08-04-18 04/04/18 04/04/2017   [facility]CU - - - - - - -

## 2020-01-27 NOTE — Telephone Encounter (Signed)
Pt requested from pharmacy, d/c "therapy complete" on 12/17/19 please advise    Confidential Drug Report  Search Terms: Kaylee Harris, 12-17-1975   Search Date: 01/27/2020 13:13:26 PM   Searching on behalf of: [id number] - [name]   The Drug Utilization Report below displays all of the controlled substance prescriptions, if any, that your patient has filled in the last twelve months. The information displayed on this report is compiled from pharmacy submissions to the Department, and accurately reflects the information as submitted by the pharmacies.  This report was requested by: Denton Meek   Reference #: [id number]   Others' Prescriptions  Patient Name: Kaylee Harris   Birth Date: [birth year]   Address: [place], [place] [address]   Sex: Female   Rx Written Rx Dispensed Drug Quantity Days Supply Prescriber Name Prescriber [place] # Payment Method Dispenser   12/15/2019 12/23/2019 tramadol hcl 50 mg tablet  42 [place], [place]. #02   12/08/2019 12/13/2019 tramadol hcl 50 mg tablet  42 14 Graceann Congress [id number] [place]. #02   11/11/2019 11/18/2019 tramadol hcl 50 mg tablet  42 14 Graceann Congress [id number] [place]. #19   09/22/2019 09/27/2019 tramadol hcl 50 mg tablet  42 14 Graceann Congress [id number] [place]. #19   07/28/2019 08/02/2019 tramadol hcl 50 mg tablet  42 14 Reginold Agent [id number] [place]. #19   07/13/2019 07/13/2019 hydrocodone-acetaminophen 5-325 mg tablet  30 5 Melody Comas DMD [place]. #02   06/24/2019 06/28/2019 tramadol hcl 50 mg tablet  42 14 Reginold Agent [id number] [place]. #19   06/11/2019 06/11/2019 oxycodone hcl 5 mg tablet  6 3 Rene Kocher, [place] [id number] Insurance [place]. #02   05/12/2019 05/19/2019 tramadol hcl 50 mg tablet  42 14  Graceann Congress [id number] [place]. #19   04/10/2019 04/13/2019 tramadol hcl 50 mg tablet  68 14 [name], Darnell Level (MD) [place]. #19   02/26/2019 03/02/2019 clonazepam 0.5 mg tablet  28 28 Graceann Congress [id number] [place]. #19   02/25/2019 02/26/2019 diphenoxylate-atropine 2.5-0.025 mg tablet  240 30 Graceann Congress [id number] [place]. #19   02/25/2019 02/25/2019 tramadol hcl 50 mg tablet  42 14 Graceann Congress [id number] [place]. #19   01/26/2019 01/29/2019 tramadol hcl 50 mg tablet  42 14 Graceann Congress [id number] [place]. #19          Last office visit:   10/25/2019  Patients upcoming appointments:  Future Appointments   Date Time Provider [place]   02/02/2020  3:00 PM Tresa Res, LCSW-R CBA None   03/06/2020 10:40 AM Tonette Bihari, MD H[facility] None   04/25/2020  3:00 PM [name], MD CAF None   08/03/2020  2:00 PM [name], MD CAF None       Recent Lab Values 04/14/2019 01/04/2018 12/17/2017 10/17/2017 10/17/2017 03/31/2017 10/29/2016   EXP DATE 2019-08-04 9/20 02/02/2018 11-03-18 08-04-18 04/04/18 04/04/2017   [facility] - - - - - - -       Recent Lab results:  GENERAL CHEMISTRY   Recent Labs     04/14/19  1903 04/13/19  1623 03/05/19  1620   NA 139 137 139   K 4.2 4.7 4.8   CL  105 104 104   CO2 _0 GAP _1 UN _2 CREAT 0.81 0.98* 0.94   GFRC 88 70 74   GFRB 101 81 85   GLU 94 95 84   CA 9.7 9.8 9.5      LIPID PROFILE   Recent Labs     10/29/19  1444   CHOL 124   TRIG 91   HDL 38*   LDLC 68      LIVER PROFILE   Recent Labs     04/14/19  1903 04/13/19  1623   ALT 13 14   AST 12 14   ALK 103 115*   TB 0.3 0.5      DIABETES THYROID   Recent Labs     11/01/19  1739 07/28/19  1500   HA1C 6.3* 6.4*    No value within the past 365 days      Pending/Orders Labs:  Lab Frequency Next Occurrence   TSH Once 12/02/2019   Microalbumin,  Urine, Random Once 12/02/2019   Lipid Panel (Reflex to Direct  LDL if Triglycerides more than 400) Once 12/02/2019   Comprehensive metabolic panel Once 83/72/9021   CBC and differential Once 12/02/2019   Hemoglobin A1c Every 3 months + PRN

## 2020-01-28 MED ORDER — ALPRAZOLAM 1 MG PO TABS *I*
1.0000 mg | ORAL_TABLET | ORAL | 0 refills | Status: DC
Start: 2020-01-28 — End: 2020-03-14

## 2020-01-28 NOTE — Addendum Note (Signed)
Addended by: Grandville Silos on: 01/28/2020 02:42 PM     Modules accepted: Orders

## 2020-02-02 ENCOUNTER — Ambulatory Visit: Payer: Medicare (Managed Care)

## 2020-02-02 DIAGNOSIS — F331 Major depressive disorder, recurrent, moderate: Secondary | ICD-10-CM

## 2020-02-02 DIAGNOSIS — F4312 Post-traumatic stress disorder, chronic: Secondary | ICD-10-CM

## 2020-02-02 DIAGNOSIS — F422 Mixed obsessional thoughts and acts: Secondary | ICD-10-CM

## 2020-02-02 NOTE — Progress Notes (Addendum)
Behavioral Health Progress Note   Patient encounter was performed via Video     Patient located at: home    Provider located at: home office    Other participants in this encounter and roles:  N/A    Consent was previously obtained from the patient to complete this service via telehealth; including the potential for financial liability.  Length of Session: 45 minutes    Contact Type:  Location: Telemedicine Covid-19    Other: Video     Problem(s)/Goals Addressed from Treatment Plan:    Problem 1:   Treatment Problem #1 07/21/2019   Patient Identified Problem Bereavement       Goal for this problem:    Treatment Goal #1 07/21/2019   Patient Identified Goal Process grief stages       Progress towards this goal: Patient reports an additional stressor.    Mental Status Exam:  APPEARANCE: Appears stated age, Casual  ATTITUDE TOWARD INTERVIEWER: Cooperative  MOTOR ACTIVITY: WNL (within normal limits)  EYE CONTACT: Direct  SPEECH: Normal rate and tone  AFFECT: Full Range  MOOD: Anxious and Normal  THOUGHT PROCESS: Normal  THOUGHT CONTENT: No unusual themes  PERCEPTION: No evidence of hallucinations  CURRENT SUICIDAL IDEATION: patient denies  CURRENT HOMICIDAL IDEATION: Patient denies  ORIENTATION: Alert and Oriented X 3.  CONCENTRATION: Good  MEMORY:   Recent: intact   Remote: intact  COGNITIVE FUNCTION: Average intelligence  JUDGMENT: Intact  IMPULSE CONTROL: Good and Fair  INSIGHT: Good and Fair    Risk Assessment:  ASSESSMENT OF RISK FOR SUICIDAL BEHAVIOR  Changes in risk for suicide from baseline Formulation of Risk and/or previous intake, including newly identified risk, if any: none  Violence risk was assessed and No Change noted from baseline formulation of risk and/or previous assessment.    Session Content::Patient reports an additional stressor. Patient talked about having to go to ED with her mother today and spending most of the day doing this. She said that "every two seconds" there is something wrong with her  mother and it has gotten worse since she fell. Patient said that she is not taking her to every appointment but it still feels overwhelming and she worries. Patient told Probation officer that [name] really wanted her to participate in her father's birthday celebration so she did this to "make her happy". She said that she was present but shut down and did insist on riding in the front rather than in the back and letting him ride in front with [place]. Writer provided support and validation. Patient said that she would like to resume driving and hasn't passed out in over 6 months. Writer explored patient's core fears and patient said that they are hurting someone or the car. Writer suggested practicing in a deserted parking lot to start. Patient said that there was some good news - her brother broke up with his girlfriend. She said that she has resumed her relationships with her nieces and is happy about this. Patient said that her PCP switched her anxiety medication from klonopin to xanax because it wasn't working.      Visit Diagnosis:      ICD-10-CM ICD-9-CM   1. Chronic post-traumatic stress disorder (PTSD)  F43.12 309.81   2. Mixed obsessional thoughts and acts  F42.2 300.3   3. MDD (major depressive disorder), recurrent episode, moderate  F33.1 296.32       Interventions:  Bereavement Therapy  Supportive Psychotherapy  Taught/practiced coping skills (specify skills used):  Mindfulness, breathing,  grounding  Taught/practiced communication skills (specify skills used):  Interpersonal, observe limits, setting boundaries.    Current Treatment Plan   Created/Updated On 07/21/2019   Next Treatment Plan Due 01/20/2020     Consent was previously obtained from the patient to complete this Video consult; including the potential for financial liability.    Time spent on the  Video with patient: 40+ minutes    Length of time compiling the report:15 minutes     Plan:  Psychotherapy continues as described in care plan; plan remains the  same.    NEXT APPT: 02/22/20.    Tresa Res, LCSW-R

## 2020-02-03 NOTE — BH Treatment Plan (Signed)
[facility] Treatment [facility]     Date of Plan:   Created/Updated On 02/03/2020   FROM 02/03/2020      Created/Updated On 02/03/2020   Next Treatment Plan Due 08/01/2020       Diagnostic Impression    ICD-10-CM ICD-9-CM   1. Chronic post-traumatic stress disorder (PTSD)  F43.12 309.81   2. Mixed obsessional thoughts and acts  F42.2 300.3   3. MDD (major depressive disorder), recurrent episode, moderate  F33.1 296.32       Diagnosis #1 - addressed       Strengths  Strengths derived from the assessment include: Patient has good insight into behaviors and continues to work to address issues; patient has good supports.    Problem Areas  *At least one problem must be targeted toward risk reduction if Formulation of Risk or any other previous exam indicated special monitoring or intervention for suicide and/or violence risk indicated.    PROBLEM AREAS (choose and describe relevant):  THOUGHT: Negative  MOOD:Depressed and anxious  BEHAVIOR: Avoidant  ________________________________________________________________  Treatment Problem #1 02/03/2020   Patient Identified Problem Depression and anxiety        Treatment Goal #1 02/03/2020   Patient Identified Goal Reduce symptoms of depression and anxiety       The rationale for addressing this problem is that resolving it will (select all that apply):  Reduce symptoms of disorder, Reduce functional impairment associated with disorder, Decrease likelihood of hospitalization, Facilitate transfer skills learned in therapy to everday life, Is a key motivational factor for the patient's participation in treatment, Reduce risk for suicide and Reduce risk for violence      Progress toward goal(s): Problem resolving. Comment: Patient has continued to process her grief and loss following the death of her father. Patient is working to manage daily stressors related to interpersonal difficulties within her family system. Patient is not on the Suicide Care Pathway. Patient not  available to sign treatment plan due to Covid-19.    1 a. Measurable Objectives : Patient will continue to identify situational stressors and work to address them.   Date established: 02/03/20   Target date: 08/01/20   Attained or Revised? new    1 b. Measurable Objectives : Patient will identify personal goals and work on steps to attain these.   Date established: 02/03/20   Target date: 08/01/20   Attained or Revised? new    ________________________________________________________________      Plan  TREATMENT MODALITIES:  Individual psychotherapy for 30-60 min Q 1-3 weeks with Tresa Res, LCSW-R.  TBD group psychotherapy for 60-90 min Q 1 weeks with TBD.  Psychopharmacology visits 20-45 min Q 2-8 weeks with TBD.     DISCHARGE CRITERIA for this treatment setting: Patient has a 50% reduction in symptoms as evidenced by PHQ-9 and GAD-7 scores.    Clinician's name: Tresa Res, LCSW-R    Psychiatrist's Name: Cline Crock, MD    Patient/Family Statement  PATIENT/FAMILY STATEMENT:  Obtain patient and family input into the treatment plan, including areas of agreement / disagreement.  Obtain patient's signature - if not possible, briefly describe the reason.     Patient Comments:          I HAVE PARTICIPATED IN THE DEVELOPMENT OF THIS TREATMENT PLAN AND I AGREE WITH ITS CONTENTS:       Patient Signature: _______________________________________________________    Date: ______________________________

## 2020-02-04 ENCOUNTER — Telehealth: Payer: Self-pay

## 2020-02-04 NOTE — Telephone Encounter (Signed)
Left message for patient to return call, received request from pharmacy for Clonazeopam 0.5mg  tab, patient is not currently on this medication an Probation officer does not see a history of this medication being prescribed. Needs clarification

## 2020-02-07 NOTE — Telephone Encounter (Signed)
The patient called back. The patient doesn't need this medication. She was on it but now takes another medication.

## 2020-02-09 ENCOUNTER — Encounter: Payer: Self-pay | Admitting: Primary Care

## 2020-02-10 ENCOUNTER — Ambulatory Visit: Payer: Medicare (Managed Care)

## 2020-02-18 ENCOUNTER — Encounter: Payer: Self-pay | Admitting: Gastroenterology

## 2020-02-21 ENCOUNTER — Other Ambulatory Visit: Payer: Self-pay | Admitting: Primary Care

## 2020-02-21 DIAGNOSIS — Z760 Encounter for issue of repeat prescription: Secondary | ICD-10-CM

## 2020-02-21 DIAGNOSIS — J45909 Unspecified asthma, uncomplicated: Secondary | ICD-10-CM

## 2020-02-22 ENCOUNTER — Ambulatory Visit: Payer: Medicare (Managed Care) | Attending: Psychiatry

## 2020-02-22 DIAGNOSIS — F4312 Post-traumatic stress disorder, chronic: Secondary | ICD-10-CM | POA: Insufficient documentation

## 2020-02-22 DIAGNOSIS — F331 Major depressive disorder, recurrent, moderate: Secondary | ICD-10-CM | POA: Insufficient documentation

## 2020-02-22 DIAGNOSIS — F422 Mixed obsessional thoughts and acts: Secondary | ICD-10-CM

## 2020-02-22 MED ORDER — FLUTICASONE PROPIONATE HFA 220 MCG/ACT IN AERO *I*
1.0000 | INHALATION_SPRAY | Freq: Every day | RESPIRATORY_TRACT | 2 refills | Status: DC
Start: 2020-02-22 — End: 2020-12-13

## 2020-02-22 MED ORDER — TRAMADOL HCL 50 MG PO TABS *I*
ORAL_TABLET | ORAL | 0 refills | Status: DC
Start: 2020-02-22 — End: 2020-05-07

## 2020-02-22 NOTE — Telephone Encounter (Signed)
Last office visit:   10/25/2019  Patients upcoming appointments:  Future Appointments   Date Time Provider [place]   02/22/2020  3:00 PM Tresa Res, LCSW-R [facility] None   03/06/2020 10:40 AM Tonette Bihari, MD [facility] None   04/25/2020  3:00 PM [name], MD [facility] None   08/03/2020  2:00 PM [name], MD [facility] None       Recent Lab Values 04/14/2019 01/04/2018 12/17/2017 10/17/2017 10/17/2017 03/31/2017 10/29/2016   EXP DATE 2019-08-04 9/20 02/02/2018 11-03-18 08-04-18 04/04/18 04/04/2017   [facility] - - - - - - -       Recent Lab results:  GENERAL CHEMISTRY   Recent Labs     04/14/19  1903 04/13/19  1623 03/05/19  1620   NA 139 137 139   K 4.2 4.7 4.8   CL 105 104 104   CO2 _0 GAP _1 UN _2 CREAT 0.81 0.98* 0.94   GFRC 88 70 74   GFRB 101 81 85   GLU 94 95 84   CA 9.7 9.8 9.5      LIPID PROFILE   Recent Labs     10/29/19  1444   CHOL 124   TRIG 91   HDL 38*   LDLC 68      LIVER PROFILE   Recent Labs     04/14/19  1903 04/13/19  1623   ALT 13 14   AST 12 14   ALK 103 115*   TB 0.3 0.5      DIABETES THYROID   Recent Labs     11/01/19  1739 07/28/19  1500   HA1C 6.3* 6.4*    No value within the past 365 days      Pending/Orders Labs:  Lab Frequency Next Occurrence   TSH Once 12/02/2019   Microalbumin, Urine, Random Once 12/02/2019   Lipid Panel (Reflex to Direct  LDL if Triglycerides more than 400) Once 12/02/2019   Comprehensive metabolic panel Once 64/68/0321   CBC and differential Once 12/02/2019   Hemoglobin A1c Every 3 months + PRN

## 2020-02-22 NOTE — Telephone Encounter (Signed)
Last office visit:   10/25/2019  Patients upcoming appointments:  Future Appointments   Date Time Provider [place]   02/22/2020  3:00 PM Tresa Res, LCSW-R [facility] None   03/06/2020 10:40 AM Tonette Bihari, MD [facility] None   04/25/2020  3:00 PM [name], MD [facility] None   08/03/2020  2:00 PM [name], MD [facility] None       Recent Lab Values 04/14/2019 01/04/2018 12/17/2017 10/17/2017 10/17/2017 03/31/2017 10/29/2016   EXP DATE 2019-08-04 9/20 02/02/2018 11-03-18 08-04-18 04/04/18 04/04/2017   [facility] - - - - - - -       Recent Lab results:  GENERAL CHEMISTRY   Recent Labs     04/14/19  1903 04/13/19  1623 03/05/19  1620   NA 139 137 139   K 4.2 4.7 4.8   CL 105 104 104   CO2 _0 GAP _1 UN _2 CREAT 0.81 0.98* 0.94   GFRC 88 70 74   GFRB 101 81 85   GLU 94 95 84   CA 9.7 9.8 9.5      LIPID PROFILE   Recent Labs     10/29/19  1444   CHOL 124   TRIG 91   HDL 38*   LDLC 68      LIVER PROFILE   Recent Labs     04/14/19  1903 04/13/19  1623   ALT 13 14   AST 12 14   ALK 103 115*   TB 0.3 0.5      DIABETES THYROID   Recent Labs     11/01/19  1739 07/28/19  1500   HA1C 6.3* 6.4*    No value within the past 365 days      Pending/Orders Labs:  Lab Frequency Next Occurrence   TSH Once 12/02/2019   Microalbumin, Urine, Random Once 12/02/2019   Lipid Panel (Reflex to Direct  LDL if Triglycerides more than 400) Once 12/02/2019   Comprehensive metabolic panel Once 93/81/8299   CBC and differential Once 12/02/2019   Hemoglobin A1c Every 3 months + PRN       Confidential Drug Report  Search Terms: Mckinleigh Schuchart, 27-Dec-1975   Search Date: 02/22/2020 06:41:16 AM   Searching on behalf of: [id number] - [name]   The Drug Utilization Report below displays all of the controlled substance prescriptions, if any, that your patient has filled in the last twelve months. The information displayed on this report is compiled from pharmacy submissions to the Department, and accurately reflects the information as submitted  by the pharmacies.  This report was requested by: Dorene Ar   Reference #: [id number]   Others' Prescriptions  Patient Name: Gaila Engebretsen   Birth Date: [birth year]   Address: [place], [place] [address]   Sex: Female   Rx Written Rx Dispensed Drug Quantity Days Supply Prescriber Name Prescriber [place] # Payment Method Dispenser   01/28/2020 02/02/2020 alprazolam 1 mg tablet  15 15 [name], Darnell Level (MD) [place]. #19   12/15/2019 12/23/2019 tramadol hcl 50 mg tablet  42 14 Graceann Congress [id number] [place]. #02   12/08/2019 12/13/2019 tramadol hcl 50 mg tablet  42 14 Graceann Congress [id number] [place]. #02   11/11/2019 11/18/2019 tramadol hcl 50 mg tablet  42 14 Graceann Congress [id number] [place]. #  tramadol hcl 50 mg tablet  42 14 Burke, Brandi [id number] [employer] #19   07/28/2019 08/02/2019 tramadol hcl 50 mg tablet  42 14 Costello, Alice [id number] [employer] #19   07/13/2019 07/13/2019 hydrocodone-acetaminophen 5-325 mg tablet  30 5 Bracci, Andrew DMD [id number] [employer] #02   06/24/2019 06/28/2019 tramadol hcl 50 mg tablet  42 14 Costello, Alice [id number] [employer] #19   06/11/2019 06/11/2019 oxycodone hcl 5 mg tablet  6 3 Sunol Carrillo, Emily J MD [id number] [employer] #02   05/12/2019 05/19/2019 tramadol hcl 50 mg tablet  42 14 Burke, Brandi [id number] [employer] #19   04/10/2019 04/13/2019 tramadol hcl 50 mg tablet  42 14 Moore, Jillian, G (MD) [id number] [employer] #19   02/26/2019 03/02/2019 clonazepam 0.5 mg tablet  28 28 Burke, Brandi [id number] [employer] #19   02/25/2019 02/26/2019 diphenoxylate-atropine 2.5-0.025 mg tablet  240 30 Burke, Brandi  [id number] [employer] #19   02/25/2019 02/25/2019 tramadol hcl 50 mg tablet  42 14 Burke, Brandi [id number] [employer] #19   * - Drugs marked with an asterisk are compound drugs. If the compound drug is made up of more than one controlled substance, then each controlled substance will be a separate row in the table.  © 2021 [employer]

## 2020-02-24 NOTE — Progress Notes (Signed)
Behavioral Health Progress Note   Length of Session: 45 minutes    Contact Type:  Location: On Site    Face to Face     Problem(s)/Goals Addressed from Treatment Plan:    Problem 1:   Treatment Problem #1 02/03/2020   Patient Identified Problem Depression and anxiety       Goal for this problem:    Treatment Goal #1 02/03/2020   Patient Identified Goal Reduce symptoms of depression and anxiety       Progress towards this goal: Patient reports situational anxiety.    Mental Status Exam:  APPEARANCE: Appears stated age, Casual  ATTITUDE TOWARD INTERVIEWER: Cooperative  MOTOR ACTIVITY: WNL (within normal limits)  EYE CONTACT: Direct  SPEECH: Normal rate and tone  AFFECT: Full Range  MOOD: Anxious and Normal  THOUGHT PROCESS: Normal  THOUGHT CONTENT: No unusual themes  PERCEPTION: No evidence of hallucinations  CURRENT SUICIDAL IDEATION: patient denies  CURRENT HOMICIDAL IDEATION: Patient denies  ORIENTATION: Alert and Oriented X 3.  CONCENTRATION: Good  MEMORY:   Recent: intact   Remote: intact  COGNITIVE FUNCTION: Average intelligence  JUDGMENT: Intact  IMPULSE CONTROL: Good and Fair  INSIGHT: Good and Fair    Risk Assessment:  ASSESSMENT OF RISK FOR SUICIDAL BEHAVIOR  Changes in risk for suicide from baseline Formulation of Risk and/or previous intake, including newly identified risk, if any: none  Violence risk was assessed and No Change noted from baseline formulation of risk and/or previous assessment.    Session Content:: Patient reports situational anxiety. Patient talked about recent stressors: brother's new girlfriend, mother's issues and trying to get her and herself and [name] to appointments on time. Patient and Probation officer discussed patient's continued efforts to set boundaries and Probation officer validated and praised patient. Patient told Probation officer about [name] talking about her brother's patterns of behavior of getting into another relationship while still living with a woman. She said that this was triggering as she  still holds a lot of guilt for having an affair 8 years ago. Writer and patient discussed the differences between patient's behavior and her brother's. Patient said that her brother is taking them all to [place] to his house for a week's vacation and she is looking forward to this.    Visit Diagnosis:      ICD-10-CM ICD-9-CM   1. Chronic post-traumatic stress disorder (PTSD)  F43.12 309.81   2. MDD (major depressive disorder), recurrent episode, moderate  F33.1 296.32   3. Mixed obsessional thoughts and acts  F42.2 300.3       Interventions:  Bereavement Therapy  Supportive Psychotherapy  Taught/practiced coping skills (specify skills used):  Mindfulness, breathing, grounding  Taught/practiced communication skills (specify skills used):  Interpersonal, observe limits, setting boundaries.    Current Treatment Plan   Created/Updated On 02/03/2020   Next Treatment Plan Due 08/01/2020     Plan:  Psychotherapy continues as described in care plan; plan remains the same.    NEXT APPT: 03/14/20.    Tresa Res, LCSW-R

## 2020-03-05 NOTE — Progress Notes (Signed)
Endocrinology, Diabetes, and Metabolism [facility] for Diabetes.      HPI:    This is a 44 y.o. female with a history of Type 2 Diabetes for almost 10 years.  Known complications include none.    Since her last visit, she has had significant stress and loss. She is still bereaving the loss her father in January. Her mother also suffered a fall and neck fracture and is currently in rehab.  She admits to significant whole body pain due to her fibromyalgia.    She is trying very hard to manage her Diabetes well. More recently she is using an app to manage her food intake. She has been having lows more often after eating. A few weeks ago, we reduced her carb ratios.  Today her blood sugar is in the 80s while she comes in to her appt with her significant other, [name] who is very supportive and still has 1.5 units of IOB with the arrow trending down.  Recently she had pizza for dinner and did not bolus, she had an alcoholic drink (which is rare) but started to drop, she drank orange juice and did not spike at all. She ended up with BG in the 130s    Over 2.5 years she has lost 30 lbs.    Denies any CP or SOB, no polyuria, no polydipsia. Occasional double vision when she feels a migraine is coming on, she takes topamax for her Migraines which seem helpful. No blurry vision. No severe hypoglycemia requiring assistance or EMS. No paresthesias in hands or feet. She does have a slight numbness on left thigh ever since she had R shoulder surgery ( was told it could have been since she was placed on her left side during the procedure).  No open wounds or sores on feet and performs daily self checks.Has ongoing diabetes health maintenance activities done including routine eye exams, podiatry and dental.    Current diabetes regimen:   Trulicity 1.60VP weekly     Humalog in pump  OMNIPOD DASH-Dexcom G6  Basal Rates:  MN 0.8 units/hr  3AM 1.15 units/hr  6AM 1.2 units/hr  Noon  1.0 units/hr  4PM 0.95 units/hr  10pm 0.8  Units/hr      ICR  MN-MN 12-->13  ISF 40  Target 140  Active Insulin 4h  Glucose Sensor - G6 Dexcom  She is trying to eat smaller portions more often and when she boluses she purposely puts less carbs in pump to avoid having to treat a low, POTS makes her feel tired and bloated.    She lacks motivation right now and feels fatigued often    Blood sugar logs and/or meter were Reviewed on Glooko/Dexcom  Past 30 days  Lowest BG in the 50s    Fasting 70s-100s  Lunch 100s-130s  Dinner  80s-190s post 50s/60s-mid 100s  Bedtime 140s-200s  OVN 66-200s                Checking 4-6x daily.     Exercise and diet habits:  Diet:healthy choices, small meals several times per day  Exercise walking daily    Last dilated eye exam: routine  Foot care:  Podiatry N/A; self foot check Yes    Last dental appointment: routine          Allergies:   Allergies   Allergen Reactions    Morphine Itching    Trazodone Anaphylaxis    Seasonal Allergies Itching    Lidocaine Rash  Patches caused a rash    Nsaids Other (See Comments)     Bleeding/epistaxis    Other [Other] Other (See Comments)     Derma Bond(skin glue) pruritis/redness and c/o respiratory distress.   Received name: Other       Current Outpatient Medications on File Prior to Visit   Medication Sig Dispense Refill    fluticasone (FLOVENT HFA) 220 MCG/ACT inhaler Inhale 1 puff into the lungs daily  Shake well before each use. 1 each 2    traMADol (ULTRAM) 50 mg tablet TAKE 1 TABLET BY MOUTH EVERY 8 HOURS AS NEEDED FOR HEAD AND ABDOMINAL PAIN MAXIMUM DAILY DOSE OF 3 TABLETS PER DAY 42 tablet 0    ALPRAZolam (XANAX) 1 MG tablet Take 1 tablet (1 mg total) by mouth three times a week  Max daily dose: 1 mg As needed for anxiety 15 tablet 0    cetirizine (ZYRTEC) 10 MG tablet Take 1 tablet (10 mg total) by mouth 2 times daily 180 tablet 1    famotidine (PEPCID) 20 MG tablet TAKE 1 TABLET BY MOUTH EVERY EVENING AS NEEDED FOR HEARTBURN 90 tablet 1    DULoxetine (CYMBALTA) 30 MG DR capsule Take 1  capsule (30 mg total) by mouth daily  Take with 21m dose to total 948mdaily 90 capsule 3    lamoTRIgine (LAMICTAL) 200 MG tablet Take 1 tablet (200 mg total) by mouth daily Replacing 15015mose 90 tablet 1    DULoxetine (CYMBALTA) 60 MG DR capsule Take 1 capsule (60 mg total) by mouth daily Replacing 81m40mse 90 capsule 1    Insulin Disposable Pump (OMNIPOD DASH 5 PACK PODS) MISC By 1 device no specified route as needed Change every 2 days = 45 pods for 90 days supply = 9 (5packs) 45 each 3    albuterol HFA (PROVENTIL, VENTOLIN, PROAIR HFA) 108 (90 Base) MCG/ACT inhaler INHALE 1 TO 2 PUFFS BY MOUTH EVERY 6 HOURS AS NEEDED FOR WHEEZING - SHAKE WELL BEFORE EACH USE 18 g 1    SUMAtriptan (IMITREX) 50 MG tablet TAKE 1 TABLET BY MOUTH AS NEEDED FOR MIGRAINE, TAKE AT ONSET OF HEADACHE, MAY REPEAT ONCE IN 2 HOURS 27 tablet 1    promethazine (PHENERGAN) 25 MG tablet Take 1 tablet (25 mg total) by mouth every 4-6 hours as needed 30 tablet 3    triamcinolone (KENALOG) 0.5 % cream Apply topically 2 times daily to the following areas: arms 45 g 1    dulaglutide (TRULICITY) 0.751.610WR/6.0AV Inject 0.5 mLs (0.75 mg total) into the skin every 7 days 2 mL 5    Alcohol Swabs (SM ALCOHOL PREP) 70 % PADS USE 1 PAD TOPICALLY TWO TIMES DAILY TO CHECK BLOOD GLUCOSE 100 each 3    clindamycin (CLEOCIN T) 1 % external solution APPLY TOPICALLY TWO TIMES DAILY, CONTINUE UNTIL IMPROVEMENT IN CYSTS 60 mL 1    dicyclomine (BENTYL) 10 MG capsule Take 1 capsule (10 mg total) by mouth 4 times daily (before meals and nightly) TAKE 1 CAPSULE BY MOUTH FOUR TIMES DAILY BEFORE MEALS AND NIGHTLY 360 capsule 1    ascorbic acid (VITAMIN C) 100 MG tablet Take 5 tablets (500 mg total) by mouth daily 100 tablet 3    Continuous Blood Gluc Transmit (DEXCOM G6 TRANSMITTER) Attach to sensor. Change every 90 days 1 each 3    Continuous Blood Gluc Sensor (DEXCOM G6 SENSOR) Inject into the skin as needed (change every 10 days) 9 each 3  fluticasone (FLONASE) 50 MCG/ACT nasal spray Spray 1 spray into nostril daily 48 g 3    Continuous Blood Gluc Receiver (DEXCOM G6 RECEIVER) By no specified route as needed 9 each 3    pantoprazole (PROTONIX) 40 MG EC tablet Take 1 tablet (40 mg total) by mouth daily Swallow whole. Do not crush, break, or chew. 90 tablet 3    benzoyl peroxide 10 % LIQD external liquid Apply topically 2 times daily to the following areas: under arms, under breast, groin 227 g 3    LIDOCAINE HCL URETHRAL/MUCOSAL 2 % jelly APPLY TOPICALLY THREE TIMES DAILY AS NEEDED FOR PAIN 30 mL 4    insulin lispro 100 UNIT/ML injection vial Inject as directed via pump up to Maximum  60  Units/day 60 mL 3    busPIRone (BUSPAR) 30 MG tablet Take 1 tablet (30 mg total) by mouth 2 times daily 180 tablet 3    topiramate (TOPAMAX) 100 MG tablet Take 1 tablet (100 mg total) by mouth 2 times daily 180 tablet 3    insulin syringe-needle U-100 (BD INSULIN SYRINGE U/F) 31G X 5/16" 0.3 ML HALF-UNIT USE THREE TIMES DAILY AS INSTRUCTED 100 each 10    diphenoxylate-atropine (LOMOTIL) 2.5-0.025 MG per tablet Take 1-2 tablets by mouth 4 times daily as needed for Diarrhea Max daily dose: 8 tablets Code D 240 tablet 0    QUEtiapine (SEROQUEL XR) 50 MG 24 hr tablet Take 1 tablet (50 mg total) by mouth nightly Swallow whole. Do not chew, crush, or break. 90 tablet 3    blood pressure monitor, automatic with arm cuff Use as directed to monitor blood pressure 1 each 0    incontinence supply disposable Order Description:  Wear as needed for stool leakage 100 each 3    zinc oxide 20 % ointment Apply topically as needed for Dry Skin 30 g 3    clotrimazole (LOTRIMIN) 1 % cream Apply topically 2 times daily 15 g 3    Continuous Blood Gluc Receiver (DEXCOM G6 RECEIVER) DEVI By 1 Device no specified route as needed 1 Device 0    famciclovir (FAMVIR) 500 MG tablet Take 1 tablet (500 mg total) by mouth 3 times daily as needed 30 tablet 5    glucagon (BAQSIMI) 3  MG/DOSE nasal powder Inhale one dose (3 mg) into one nostril once as needed for low blood sugar. If no response in 15 min, inhale second dose from new device 2 each 5    blood glucose test strip Test  8 times a day.   Brand name of strips Contour NEXT test strips for linked omnipod pump 250 strip 11    lancets (BAYER MICROLET) Use   8  times per day as instructed for blood glucose testing. 250 each 11    azelastine (OPTIVAR) 0.05 % ophthalmic solution Place 1 drop into both eyes 2 times daily      SUMAtriptan refill (IMITREX STATDOSE) 6 MG/0.5ML injection INJECT 0.5ML UNDER THE SKIN ONCE AS NEEDED FOR MIGRAINE. MAY REPEAT DOSE ONCE AFTER 1 HOUR 2 Syringe 5    glucagon (GLUCAGEN) 1 MG injection kit Inject 1 mg into the skin once as needed for Low blood sugar   Discard any unused portion. 1 each 5    atorvastatin (LIPITOR) 40 MG tablet Take 1 tablet (40 mg total) by mouth daily (with dinner) 90 tablet 3    metoprolol (TOPROL-XL) 25 MG 24 hr tablet Take by mouth daily  midodrine (PROAMATINE) 10 MG tablet Take 1 tablet (10 mg total) by mouth 3 times daily 90 tablet 0    Non-System Medication The above patient is followed in our clinic and cannot resume work permanently. 1 each 1     No current facility-administered medications on file prior to visit.     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 no  -Dawn phenomenon:  yes  -HbA1c greater than 7% or 1% over upper range of normal: no  -History of suboptimal glycemic  control before or during pregnancy: na  -LAST 30 DAYS of BG LOGS (in notes below) or ranges from: Glooko/G6 report 50s-200s    Patient's problem list, allergies, and medications were reviewed and updated as appropriate.  Please see the EHR for full details.    Physical Examination:   Vitals:    03/06/20 1116   Weight: 108.4 kg (239 lb)     Body mass index is 36.35 kg/m.  Wt Readings from Last 3 Encounters:   03/06/20 108.4 kg (239 lb)   12/23/19 110.7 kg (244 lb)   12/17/19 116.1 kg (256 lb)       CONSTITUTIONAL:  well developed, well nourished, no acute distress  HEENT:  no lid lank, no proptosis.   NECK on inspection, no thyromegaly,   HEART/VASCULAR:no LE edema, 2+ DP pulses  EXTREMITIES: foot exam deferred.  NEUROLOGICAL: alert and oriented x 3 no tremor.  PSYCH:mood appropriate    Labs and Imaging:    I personally reviewed and confirmed all laboratory and radiology testing listed below:      Lab results: 04/14/19  1903   Sodium 139   Potassium 4.2   Chloride 105   CO2 20   UN 8   Creatinine 0.81   GFR,Caucasian 88   GFR,Black 101   Glucose 94   Calcium 9.7         Lab Results   Component Value Date    HA1C 6.3 (H) 11/01/2019       Lab Results   Component Value Date    ALT 13 04/14/2019    AST 12 04/14/2019     Lab Results   Component Value Date    CHOL 124 10/29/2019    HDL 38 (L) 10/29/2019    LDLC 68 10/29/2019    TRIG 91 10/29/2019    [place] 3.3 10/29/2019     Lab Results   Component Value Date    TSH 1.50 01/22/2018       Lab Results   Component Value Date    WBC 11.5 (H) 12/23/2019    HGB 13.5 12/23/2019    HCT 41 12/23/2019    MCV 88 12/23/2019    PLT 388 (H) 12/23/2019       Assessment: This is a 44 y.o. female with Type 2 Diabetes Uncontrolled with hyperglycemia.  Goal A1c is <7%. Diabetes worsening with  recurrent hypoglycemia that is likely multifactorial due to weight loss and healthy lifestyle with reduced insulin requirements.  GLP-1 may also be contributing to appetite suppression and limiting  postprandial excursions. She will need a reduction in her basal settings and carb ratio    Plan:   Pump changes:  ICR MN-MN 12 change to 1: 13  She will send me an update via mychart  BP - on betablocker and diuretic  On statin therapy.  Recommend annual dilated eye exams and biannual dental exams for routine health maintenance.  ANNUAL labs before next appointment  Continue mental health follow up    Tonette Bihari, MD  Department of Endocrinology  [address], [place], [place] [address]  Phone: [phone]  Fax: [phone]  Pager: [phone]

## 2020-03-06 ENCOUNTER — Ambulatory Visit: Payer: Medicare (Managed Care) | Admitting: Endocrinology

## 2020-03-06 VITALS — Wt 239.0 lb

## 2020-03-06 DIAGNOSIS — E11649 Type 2 diabetes mellitus with hypoglycemia without coma: Secondary | ICD-10-CM

## 2020-03-09 ENCOUNTER — Ambulatory Visit: Payer: Medicare (Managed Care) | Attending: Primary Care | Admitting: Primary Care

## 2020-03-09 ENCOUNTER — Encounter: Payer: Self-pay | Admitting: Primary Care

## 2020-03-09 VITALS — HR 76 | Temp 97.1°F

## 2020-03-09 DIAGNOSIS — Z20828 Contact with and (suspected) exposure to other viral communicable diseases: Secondary | ICD-10-CM | POA: Insufficient documentation

## 2020-03-09 DIAGNOSIS — R059 Cough, unspecified: Secondary | ICD-10-CM

## 2020-03-09 DIAGNOSIS — Z20822 Contact with and (suspected) exposure to covid-19: Secondary | ICD-10-CM | POA: Insufficient documentation

## 2020-03-09 NOTE — Progress Notes (Signed)
[facility]            SUBJECTIVE    Pt is here to discuss:    Chief Complaint   Patient presents with    Cough        cough, SOB , rhinorhea, mild body aches, chronic diarrhea, +nausea, sore throat on L  Using rescue inhaler more  denies h/a, vomiting.    Wife here with symptoms of h/a, chills, myalgias. Also getting tested for COVID    PMH / Family Hx / Social Hx  Patient's medications, allergies, problem list, past medical, social histories were reviewed and notable for:    Current Outpatient Medications   Medication Sig Note    fluticasone (FLOVENT HFA) 220 MCG/ACT inhaler Inhale 1 puff into the lungs daily  Shake well before each use.     traMADol (ULTRAM) 50 mg tablet TAKE 1 TABLET BY MOUTH EVERY 8 HOURS AS NEEDED FOR HEAD AND ABDOMINAL PAIN MAXIMUM DAILY DOSE OF 3 TABLETS PER DAY     ALPRAZolam (XANAX) 1 MG tablet Take 1 tablet (1 mg total) by mouth three times a week  Max daily dose: 1 mg As needed for anxiety     cetirizine (ZYRTEC) 10 MG tablet Take 1 tablet (10 mg total) by mouth 2 times daily     famotidine (PEPCID) 20 MG tablet TAKE 1 TABLET BY MOUTH EVERY EVENING AS NEEDED FOR HEARTBURN     DULoxetine (CYMBALTA) 30 MG DR capsule Take 1 capsule (30 mg total) by mouth daily  Take with $RemoveB'60mg'gzsBiVuL$  dose to total $Remove'90mg'bTPJvAm$  daily     lamoTRIgine (LAMICTAL) 200 MG tablet Take 1 tablet (200 mg total) by mouth daily Replacing $RemoveBeforeD'150mg'rJFBwgTCbVchHK$  dose     DULoxetine (CYMBALTA) 60 MG DR capsule Take 1 capsule (60 mg total) by mouth daily Replacing $RemoveBeforeD'30mg'fUVLVnzGHInMcN$  dose     Insulin Disposable Pump (OMNIPOD DASH 5 PACK PODS) MISC By 1 device no specified route as needed Change every 2 days = 45 pods for 90 days supply = 9 (5packs)     albuterol HFA (PROVENTIL, VENTOLIN, PROAIR HFA) 108 (90 Base) MCG/ACT inhaler INHALE 1 TO 2 PUFFS BY MOUTH EVERY 6 HOURS AS NEEDED FOR WHEEZING - SHAKE WELL BEFORE EACH USE     SUMAtriptan (IMITREX) 50 MG tablet TAKE 1 TABLET BY MOUTH AS NEEDED FOR MIGRAINE, TAKE AT ONSET OF HEADACHE, MAY REPEAT  ONCE IN 2 HOURS     promethazine (PHENERGAN) 25 MG tablet Take 1 tablet (25 mg total) by mouth every 4-6 hours as needed     triamcinolone (KENALOG) 0.5 % cream Apply topically 2 times daily to the following areas: arms     dulaglutide (TRULICITY) 1.30 QM/5.7QI pen Inject 0.5 mLs (0.75 mg total) into the skin every 7 days     Alcohol Swabs (SM ALCOHOL PREP) 70 % PADS USE 1 PAD TOPICALLY TWO TIMES DAILY TO CHECK BLOOD GLUCOSE     clindamycin (CLEOCIN T) 1 % external solution APPLY TOPICALLY TWO TIMES DAILY, CONTINUE UNTIL IMPROVEMENT IN CYSTS     dicyclomine (BENTYL) 10 MG capsule Take 1 capsule (10 mg total) by mouth 4 times daily (before meals and nightly) TAKE 1 CAPSULE BY MOUTH FOUR TIMES DAILY BEFORE MEALS AND NIGHTLY     ascorbic acid (VITAMIN C) 100 MG tablet Take 5 tablets (500 mg total) by mouth daily     Continuous Blood Gluc Transmit (DEXCOM G6 TRANSMITTER) Attach to sensor. Change every 90 days     Continuous  Blood Gluc Sensor (DEXCOM G6 SENSOR) Inject into the skin as needed (change every 10 days)     fluticasone (FLONASE) 50 MCG/ACT nasal spray Spray 1 spray into nostril daily     Continuous Blood Gluc Receiver (DEXCOM G6 RECEIVER) By no specified route as needed     pantoprazole (PROTONIX) 40 MG EC tablet Take 1 tablet (40 mg total) by mouth daily Swallow whole. Do not crush, break, or chew.     benzoyl peroxide 10 % LIQD external liquid Apply topically 2 times daily to the following areas: under arms, under breast, groin     LIDOCAINE HCL URETHRAL/MUCOSAL 2 % jelly APPLY TOPICALLY THREE TIMES DAILY AS NEEDED FOR PAIN     insulin lispro 100 UNIT/ML injection vial Inject as directed via pump up to Maximum  60  Units/day     busPIRone (BUSPAR) 30 MG tablet Take 1 tablet (30 mg total) by mouth 2 times daily     topiramate (TOPAMAX) 100 MG tablet Take 1 tablet (100 mg total) by mouth 2 times daily     insulin syringe-needle U-100 (BD INSULIN SYRINGE U/F) 31G X 5/16" 0.3 ML HALF-UNIT USE  THREE TIMES DAILY AS INSTRUCTED     diphenoxylate-atropine (LOMOTIL) 2.5-0.025 MG per tablet Take 1-2 tablets by mouth 4 times daily as needed for Diarrhea Max daily dose: 8 tablets Code D     QUEtiapine (SEROQUEL XR) 50 MG 24 hr tablet Take 1 tablet (50 mg total) by mouth nightly Swallow whole. Do not chew, crush, or break.     blood pressure monitor, automatic with arm cuff Use as directed to monitor blood pressure     incontinence supply disposable Order Description:  Wear as needed for stool leakage     zinc oxide 20 % ointment Apply topically as needed for Dry Skin     clotrimazole (LOTRIMIN) 1 % cream Apply topically 2 times daily     Continuous Blood Gluc Receiver (DEXCOM G6 RECEIVER) DEVI By 1 Device no specified route as needed     famciclovir (FAMVIR) 500 MG tablet Take 1 tablet (500 mg total) by mouth 3 times daily as needed     glucagon (BAQSIMI) 3 MG/DOSE nasal powder Inhale one dose (3 mg) into one nostril once as needed for low blood sugar. If no response in 15 min, inhale second dose from new device     blood glucose test strip Test  8 times a day.   Brand name of strips Contour NEXT test strips for linked omnipod pump     lancets (BAYER MICROLET) Use   8  times per day as instructed for blood glucose testing.     azelastine (OPTIVAR) 0.05 % ophthalmic solution Place 1 drop into both eyes 2 times daily     SUMAtriptan refill (IMITREX STATDOSE) 6 MG/0.5ML injection INJECT 0.5ML UNDER THE SKIN ONCE AS NEEDED FOR MIGRAINE. MAY REPEAT DOSE ONCE AFTER 1 HOUR     glucagon (GLUCAGEN) 1 MG injection kit Inject 1 mg into the skin once as needed for Low blood sugar   Discard any unused portion.     atorvastatin (LIPITOR) 40 MG tablet Take 1 tablet (40 mg total) by mouth daily (with dinner)     metoprolol (TOPROL-XL) 25 MG 24 hr tablet Take by mouth daily    06/10/2016: Received from: External Pharmacy    midodrine (PROAMATINE) 10 MG tablet Take 1 tablet (10 mg total) by mouth 3 times daily      Non-System  Medication The above patient is followed in our clinic and cannot resume work permanently.           OBJECTIVE  Vitals:    03/09/20 1625   Pulse: 76   Temp: 36.2 C (97.1 F)   SpO2: 98%     There is no height or weight on file to calculate BMI.      General: well-appearing Caucasian female, pleasant & conversant, in NAD  Eyes:. PERRLA. EOMI. Conjunctiva pink, without swelling or exudate. Lids normal.   ENT: Moist mucous membranes. Normal lips and dentition. B/L TMs grey, without bulging or erythema.   Neck: Trachea midline. Thyroid nontender, without nodularity or enlargement.   Lymph: No cervical or supraclavicular lymphadenopathy.  Lungs: Normal resp effort. CTAB.  No crackles or wheezes.  Cardiac: RRR, no M/R/G.     Psych: AAOx3, normal affect and mood. Insight and judgement intact.           ASSESSMENT & PLAN  1. Cough     - COVID-19 PCR; Future  - Influenza A PCR; Future  - Influenza B PCR; Future  - RSV PCR; Future       Recommended testing as above to rule out breakthrough COVID, RSV, flu etiologies. Reminded to quarantine until results available.     Recommended conservative care with rest, fluids,  honey (in tea or throat lozenges).  Continue rescue inhaler for symptomatic relief    Follow-up: pending results      [name], MD  [facility]  03/09/2020  4:25 PM        ______________________

## 2020-03-10 LAB — COVID-19 PCR

## 2020-03-10 LAB — INFLUENZA B PCR: Influenza B PCR: 0

## 2020-03-10 LAB — INFLUENZA A: Influenza A PCR: 0

## 2020-03-10 LAB — RSV PCR: RSV PCR: 0

## 2020-03-10 LAB — COVID-19 NAAT (PCR): COVID-19 NAAT (PCR): NEGATIVE

## 2020-03-13 ENCOUNTER — Other Ambulatory Visit: Payer: Self-pay | Admitting: Primary Care

## 2020-03-14 ENCOUNTER — Ambulatory Visit: Payer: Medicare (Managed Care) | Attending: Psychiatry

## 2020-03-14 ENCOUNTER — Ambulatory Visit: Payer: Medicare (Managed Care)

## 2020-03-14 DIAGNOSIS — Z7189 Other specified counseling: Secondary | ICD-10-CM | POA: Insufficient documentation

## 2020-03-14 DIAGNOSIS — F331 Major depressive disorder, recurrent, moderate: Secondary | ICD-10-CM

## 2020-03-14 DIAGNOSIS — F4312 Post-traumatic stress disorder, chronic: Secondary | ICD-10-CM | POA: Insufficient documentation

## 2020-03-14 DIAGNOSIS — F422 Mixed obsessional thoughts and acts: Secondary | ICD-10-CM | POA: Insufficient documentation

## 2020-03-14 NOTE — Progress Notes (Signed)
Behavioral Health Progress Note   Length of Session: 45 minutes    Contact Type:  Location: On Site    Face to Face     Problem(s)/Goals Addressed from Treatment Plan:    Problem 1:   Treatment Problem #1 02/03/2020   Patient Identified Problem Depression and anxiety       Goal for this problem:    Treatment Goal #1 02/03/2020   Patient Identified Goal Reduce symptoms of depression and anxiety       Progress towards this goal: Patient reports situational anxiety.    Mental Status Exam:  APPEARANCE: Appears stated age, Casual  ATTITUDE TOWARD INTERVIEWER: Cooperative  MOTOR ACTIVITY: WNL (within normal limits)  EYE CONTACT: Direct  SPEECH: Normal rate and tone  AFFECT: Full Range  MOOD: Anxious and Normal  THOUGHT PROCESS: Normal  THOUGHT CONTENT: No unusual themes  PERCEPTION: No evidence of hallucinations  CURRENT SUICIDAL IDEATION: patient denies  CURRENT HOMICIDAL IDEATION: Patient denies  ORIENTATION: Alert and Oriented X 3.  CONCENTRATION: Good  MEMORY:   Recent: intact   Remote: intact  COGNITIVE FUNCTION: Average intelligence  JUDGMENT: Intact  IMPULSE CONTROL: Good and Fair  INSIGHT: Good and Fair    Risk Assessment:  ASSESSMENT OF RISK FOR SUICIDAL BEHAVIOR  Changes in risk for suicide from baseline Formulation of Risk and/or previous intake, including newly identified risk, if any: none  Violence risk was assessed and No Change noted from baseline formulation of risk and/or previous assessment.    Session Content:: Patient reports situational anxiety. Patient told writer about her POTS episode today. She said that they also had a Covid scare and prior to testing it was hard to breath - when Shari's test came back negative could breath again. Patient said that [name] is having side affects from new MS medication which she stresses about. Patient talked about the upcoming holidays and grieving the loss of her father. She said that she started making a Christmas list - mom first, then started crying because  dad - 1st year w/o him and then after Christmas is when everything happened and he died. Patient said that she is still wanting it to have been Shari's dad. She said that she agreed to go to dinner with them and wasn't sure why. Patient said that she realized that she has been missing out on doing things she enjoyed doing with Shari's mother and doesn't want to do that anymore. Writer and patient discussed the healing process. Patient said that she is getting along with her brother's new girlfriend. She said that she has advocated for her nephew with her brother regarding transgender issues and she is grateful for this. Patient said that she is still getting used to adjusting her vocabulary to be affirming. She said that she is looking forward to Thanksgiving at her brother's house in [place]. Patient said that she is struggling with guilt due to not being able to take her mother to a funeral today. 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 Patient said that her mother is asking if her wedding ring still fits her - scares her b/c thinks she knows something about her health and is dying - feels bad for her b/c she is in her apartment all the time and wants to go get her but can't. Writer validated patient's thoughts and feelings. Writer and patient worked on reframing negative self  talk and increasing self compassion.     Visit Diagnosis:      ICD-10-CM ICD-9-CM   1. Chronic post-traumatic stress disorder (PTSD)  F43.12 309.81   2. MDD (major depressive disorder), recurrent episode, moderate  F33.1 296.32       Interventions:  Bereavement Therapy  Supportive Psychotherapy  Taught/practiced coping skills (specify skills used):  Mindfulness, breathing, grounding  Taught/practiced communication skills (specify skills used):  Interpersonal, observe limits, setting boundaries.    Current Treatment Plan   Created/Updated On  02/03/2020   Next Treatment Plan Due 08/01/2020     Plan:  Psychotherapy continues as described in care plan; plan remains the same.    NEXT APPT: 04/11/20.    Tresa Res, LCSW-R

## 2020-03-14 NOTE — Telephone Encounter (Signed)
Confidential Drug Report  Search Terms: Kaylee Harris, [birth year]   Search Date: 03/14/2020 08:44:59 AM   Searching on behalf of: [id number] - [name]   The Drug Utilization Report below displays all of the controlled substance prescriptions, if any, that your patient has filled in the last twelve months. The information displayed on this report is compiled from pharmacy submissions to the Department, and accurately reflects the information as submitted by the pharmacies.  This report was requested by: Denton Meek   Reference #: [id number]   Others' Prescriptions  Patient Name: Kaylee Harris   Birth Date: 1975/05/13   Address: [place], [place] [address]   Sex: Female   Rx Written Rx Dispensed Drug Quantity Days Supply Prescriber Name Prescriber [place] # Payment Method Dispenser   02/22/2020 02/23/2020 tramadol hcl 50 mg tablet  [address] Reginold Agent [id number] [place]. #02   01/28/2020 02/02/2020 alprazolam 1 mg tablet  15 15 [name], Darnell Level (MD) [place]. #19   12/15/2019 12/23/2019 tramadol hcl 50 mg tablet  42 14 Graceann Congress [id number] [place]. #02   12/08/2019 12/13/2019 tramadol hcl 50 mg tablet  42 14 Graceann Congress [id number] [place]. #02   11/11/2019 11/18/2019 tramadol hcl 50 mg tablet  42 14 Graceann Congress [id number] [place]. #19   09/22/2019 09/27/2019 tramadol hcl 50 mg tablet  42 14 Graceann Congress [id number] [address]. #19   07/28/2019 08/02/2019 tramadol hcl 50 mg tablet  42 14 Reginold Agent [id number] [place]. #19   07/13/2019 07/13/2019 hydrocodone-acetaminophen 5-325 mg tablet  30 5 Melody Comas DMD [place]. #02   06/24/2019 06/28/2019 tramadol hcl 50 mg tablet  42 14 Reginold Agent [id number] [place]. #19   06/11/2019  06/11/2019 oxycodone hcl 5 mg tablet  6 3 Josph Macho MD [id number] Insurance [place]. #02   05/12/2019 05/19/2019 tramadol hcl 50 mg tablet  42 14 Graceann Congress [id number] [place]. #19   04/10/2019 04/13/2019 tramadol hcl 50 mg tablet  59 14 [name], Darnell Level (MD) [place]. #19   * - Drugs marked with an asterisk are compound drugs. If the compound drug is made up of more than one controlled substance, then each controlled substance will be a separate row in the table.       Last office visit:   03/09/2020  Patients upcoming appointments:  Future Appointments   Date Time Provider [place]   03/14/2020  3:00 PM Tresa Res, LCSW-R CBA None   04/25/2020  3:00 PM [name], MD CAF None   07/06/2020  2:30 PM Nada Libman, NP HEC None   08/03/2020  2:00 PM [name], MD CAF None       Recent Lab Values 04/14/2019 01/04/2018 12/17/2017 10/17/2017 10/17/2017 03/31/2017 10/29/2016   EXP DATE 2019-08-04 9/20 02/02/2018 11-03-18 08-04-18 04/04/18 04/04/2017   THCU - - - - - - -       Recent Lab results:  GENERAL CHEMISTRY   Recent Labs     04/14/19  1903 04/13/19  1623   NA 139 137   K 4.2 4.7   CL 105 104   CO2 20 22   GAP 14 11   UN 8 9   CREAT 0.81 0.98*  GFRC 88 70   GFRB 101 81   GLU 94 95   CA 9.7 9.8      LIPID PROFILE   Recent Labs     10/29/19  1444   CHOL 124   TRIG 91   HDL 38*   LDLC 68      LIVER PROFILE   Recent Labs     04/14/19  1903 04/13/19  1623   ALT 13 14   AST 12 14   ALK 103 115*   TB 0.3 0.5      DIABETES THYROID   Recent Labs     11/01/19  1739 07/28/19  1500   HA1C 6.3* 6.4*    No value within the past 365 days      Pending/Orders Labs:  Lab Frequency Next Occurrence   TSH Once 12/02/2019   Microalbumin, Urine, Random Once 12/02/2019   Lipid Panel (Reflex to Direct  LDL if Triglycerides more than 400) Once 12/02/2019   Comprehensive metabolic panel Once 16/94/5038   CBC and differential  Once 12/02/2019   Hemoglobin A1c Once 03/06/2020   Hemoglobin A1c Every 3 months + PRN

## 2020-03-20 ENCOUNTER — Other Ambulatory Visit: Payer: Self-pay | Admitting: Dermatology

## 2020-03-22 ENCOUNTER — Other Ambulatory Visit: Payer: Self-pay

## 2020-03-22 DIAGNOSIS — Z9109 Other allergy status, other than to drugs and biological substances: Secondary | ICD-10-CM

## 2020-03-22 NOTE — Progress Notes (Signed)
[facility]  Monthly Summary Note  Contact Achieved with Member This Month: 11/3  Method of Contact: Telephonic   Goals Identified on Plan of Care: home aide services   Progress Towards Goals: CM and client have communicated barriers with icirc[facility] and [facility] aide service  Barriers to Goal Completion: [facility] aide service has poor communication and has yet to provide client with a home aide.   Care Manager Interventions: CM collaborated with [employer] care worker and [employer] copy to figure out why [facility] has not provided aide. CM will continue attempts to facilitate.

## 2020-03-23 ENCOUNTER — Other Ambulatory Visit: Payer: Self-pay | Admitting: Primary Care

## 2020-03-23 DIAGNOSIS — Z9109 Other allergy status, other than to drugs and biological substances: Secondary | ICD-10-CM

## 2020-03-23 MED ORDER — CETIRIZINE HCL 10 MG PO TABS *I*
10.0000 mg | ORAL_TABLET | Freq: Two times a day (BID) | ORAL | 1 refills | Status: DC
Start: 2020-03-23 — End: 2020-05-30

## 2020-03-23 NOTE — Telephone Encounter (Signed)
Already requested yesterday, awaiting PCP approval.

## 2020-04-03 ENCOUNTER — Other Ambulatory Visit: Payer: Self-pay | Admitting: Pulmonary and Critical Care Medicine

## 2020-04-05 ENCOUNTER — Encounter: Payer: Self-pay | Admitting: Primary Care

## 2020-04-05 DIAGNOSIS — S31109A Unspecified open wound of abdominal wall, unspecified quadrant without penetration into peritoneal cavity, initial encounter: Secondary | ICD-10-CM

## 2020-04-05 NOTE — Telephone Encounter (Signed)
Search Terms: Shaila Gilchrest, 1975-12-03   Search Date: 04/05/2020 09:35:43 AM   Searching on behalf of: [id number] - [name]   The Drug Utilization Report below displays all of the controlled substance prescriptions, if any, that your patient has filled in the last twelve months. The information displayed on this report is compiled from pharmacy submissions to the Department, and accurately reflects the information as submitted by the pharmacies.  This report was requested by: Phineas Real   Reference #: [id number]   Others' Prescriptions  Patient Name: Kaylee Harris   Birth Date: 1975/06/05   Address: [place], [place] [address]   Sex: Female   Rx Written Rx Dispensed Drug Quantity Days Supply Prescriber Name Prescriber [place] # Payment Method Dispenser   03/14/2020 03/16/2020 alprazolam 1 mg tablet  15 35 Reginold Agent [id number] [place]. #19   02/22/2020 02/23/2020 tramadol hcl 50 mg tablet  42 14 Reginold Agent [id number] [place]. #02   01/28/2020 02/02/2020 alprazolam 1 mg tablet  15 15 [name], Darnell Level (MD) [place]. #19   12/15/2019 12/23/2019 tramadol hcl 50 mg tablet  42 14 Graceann Congress [id number] [place]. #02   12/08/2019 12/13/2019 tramadol hcl 50 mg tablet  42 14 Graceann Congress [id number] [place]. #02   11/11/2019 11/18/2019 tramadol hcl 50 mg tablet  42 14 Graceann Congress [id number] [place]. #19   09/22/2019 09/27/2019 tramadol hcl 50 mg tablet  42 14 Graceann Congress [id number] [place]. #19   07/28/2019 08/02/2019 tramadol hcl 50 mg tablet  42 14 Reginold Agent [id number] [place]. #19   07/13/2019 07/13/2019 hydrocodone-acetaminophen 5-325 mg tablet  30 5 Melody Comas DMD [place]. #02   06/24/2019 06/28/2019 tramadol hcl 50 mg  tablet  42 14 Reginold Agent [id number] [place]. #19   06/11/2019 06/11/2019 oxycodone hcl 5 mg tablet  6 3 Josph Macho MD [id number] Insurance [place] #02   05/12/2019 05/19/2019 tramadol hcl 50 mg tablet  42 14 Graceann Congress [id number] [place]. #19   04/10/2019 04/13/2019 tramadol hcl 50 mg tablet  73 14 [name], Darnell Level (MD) [place]. #19          Last office visit:   03/09/2020  Patients upcoming appointments:  Future Appointments   Date Time Provider [place]   04/11/2020  3:00 PM Tresa Res, LCSW-R CBA None   04/25/2020  3:00 PM [name], MD CAF None   07/06/2020  2:30 PM Nada Libman, NP HEC None   08/03/2020  2:00 PM [name], MD CAF None       Recent Lab Values 04/14/2019 01/04/2018 12/17/2017 10/17/2017 10/17/2017 03/31/2017 10/29/2016   EXP DATE 2019-08-04 9/20 02/02/2018 11-03-18 08-04-18 04/04/18 04/04/2017   THCU - - - - - - -       Recent Lab results:  GENERAL CHEMISTRY   Recent Labs     04/14/19  1903 04/13/19  1623   NA 139 137   K 4.2 4.7   CL 105 104   CO2 20 22   GAP 14 11   UN 8 9   CREAT 0.81 0.98*   GFRC 88 70   GFRB 101 81   GLU 94 95   CA 9.7  9.8      LIPID PROFILE   Recent Labs     10/29/19  1444   CHOL 124   TRIG 91   HDL 38*   LDLC 68      LIVER PROFILE   Recent Labs     04/14/19  1903 04/13/19  1623   ALT 13 14   AST 12 14   ALK 103 115*   TB 0.3 0.5      DIABETES THYROID   Recent Labs     11/01/19  1739 07/28/19  1500   HA1C 6.3* 6.4*    No value within the past 365 days      Pending/Orders Labs:  Lab Frequency Next Occurrence   TSH Once 12/02/2019   Microalbumin, Urine, Random Once 12/02/2019   Lipid Panel (Reflex to Direct  LDL if Triglycerides more than 400) Once 12/02/2019   Comprehensive metabolic panel Once 62/82/4175   CBC and differential Once 12/02/2019   Hemoglobin A1c Once 03/06/2020   Hemoglobin A1c Every 3 months + PRN

## 2020-04-06 MED ORDER — CLOTRIMAZOLE 1 % EX CREA *I*
TOPICAL_CREAM | Freq: Two times a day (BID) | CUTANEOUS | 3 refills | Status: DC
Start: 2020-04-06 — End: 2021-03-22

## 2020-04-06 MED ORDER — FAMCICLOVIR 500 MG PO TABS *I*
500.0000 mg | ORAL_TABLET | Freq: Three times a day (TID) | ORAL | 5 refills | Status: DC | PRN
Start: 2020-04-06 — End: 2021-03-22

## 2020-04-11 ENCOUNTER — Other Ambulatory Visit: Payer: Self-pay | Admitting: Primary Care

## 2020-04-11 ENCOUNTER — Ambulatory Visit: Payer: Medicare (Managed Care) | Attending: Psychiatry

## 2020-04-11 DIAGNOSIS — F422 Mixed obsessional thoughts and acts: Secondary | ICD-10-CM | POA: Insufficient documentation

## 2020-04-11 DIAGNOSIS — F4312 Post-traumatic stress disorder, chronic: Secondary | ICD-10-CM | POA: Insufficient documentation

## 2020-04-11 DIAGNOSIS — F331 Major depressive disorder, recurrent, moderate: Secondary | ICD-10-CM | POA: Insufficient documentation

## 2020-04-11 DIAGNOSIS — Z7189 Other specified counseling: Secondary | ICD-10-CM

## 2020-04-11 MED ORDER — ALBUTEROL SULFATE HFA 108 (90 BASE) MCG/ACT IN AERS *I*
INHALATION_SPRAY | RESPIRATORY_TRACT | 1 refills | Status: DC
Start: 2020-04-11 — End: 2020-09-28

## 2020-04-11 NOTE — Telephone Encounter (Signed)
Search Terms: Kiora Hallberg, 1975/06/16   Search Date: 04/11/2020 14:16:44 PM   Searching on behalf of: [id number] - [name]   The Drug Utilization Report below displays all of the controlled substance prescriptions, if any, that your patient has filled in the last twelve months. The information displayed on this report is compiled from pharmacy submissions to the Department, and accurately reflects the information as submitted by the pharmacies.  This report was requested by: Phineas Real   Reference #: [id number]   Others' Prescriptions  Patient Name: Kaylee Harris   Birth Date: May 10, 1975   Address: [place], [place] [address]   Sex: Female   Rx Written Rx Dispensed Drug Quantity Days Supply Prescriber Name Prescriber [place] # Payment Method Dispenser   03/14/2020 03/16/2020 alprazolam 1 mg tablet  15 35 Reginold Agent [id number] [place]. #19   02/22/2020 02/23/2020 tramadol hcl 50 mg tablet  42 14 Reginold Agent [id number] [place]. #02   01/28/2020 02/02/2020 alprazolam 1 mg tablet  15 15 [name], Darnell Level (MD) [place]. #19   12/15/2019 12/23/2019 tramadol hcl 50 mg tablet  42 14 Graceann Congress [id number] [place]. #02   12/08/2019 12/13/2019 tramadol hcl 50 mg tablet  42 14 Graceann Congress [id number] [place]. #02   11/11/2019 11/18/2019 tramadol hcl 50 mg tablet  42 14 Graceann Congress [id number] [place]. #19   09/22/2019 09/27/2019 tramadol hcl 50 mg tablet  42 14 Graceann Congress [id number] [place]. #19   07/28/2019 08/02/2019 tramadol hcl 50 mg tablet  42 14 Reginold Agent [id number] [place]. #19   07/13/2019 07/13/2019 hydrocodone-acetaminophen 5-325 mg tablet  30 5 Melody Comas DMD [place]. #02   06/24/2019 06/28/2019 tramadol hcl 50 mg  tablet  42 14 Reginold Agent [id number] [place]. #19   06/11/2019 06/11/2019 oxycodone hcl 5 mg tablet  6 3 Josph Macho MD [id number] Insurance [place] #02   05/12/2019 05/19/2019 tramadol hcl 50 mg tablet  42 14 Graceann Congress [id number] [place]. #19   04/10/2019 04/13/2019 tramadol hcl 50 mg tablet  52 14 [name], Darnell Level (MD) [place]. #19          Last office visit:   03/09/2020  Patients upcoming appointments:  Future Appointments   Date Time Provider [place]   04/11/2020  3:00 PM Tresa Res, LCSW-R CBA None   04/25/2020  3:00 PM [name], MD CAF None   07/06/2020  2:30 PM Nada Libman, NP HEC None   08/03/2020  2:00 PM [name], MD CAF None       Recent Lab Values 04/14/2019 01/04/2018 12/17/2017 10/17/2017 10/17/2017 03/31/2017 10/29/2016   EXP DATE 2019-08-04 9/20 02/02/2018 11-03-18 08-04-18 04/04/18 04/04/2017   THCU - - - - - - -       Recent Lab results:  GENERAL CHEMISTRY   Recent Labs     04/14/19  1903 04/13/19  1623   NA 139 137   K 4.2 4.7   CL 105 104   CO2 20 22   GAP 14 11   UN 8 9   CREAT 0.81 0.98*   GFRC 88 70   GFRB 101 81   GLU 94 95   CA 9.7  9.8      LIPID PROFILE   Recent Labs     10/29/19  1444   CHOL 124   TRIG 91   HDL 38*   LDLC 68      LIVER PROFILE   Recent Labs     04/14/19  1903 04/13/19  1623   ALT 13 14   AST 12 14   ALK 103 115*   TB 0.3 0.5      DIABETES THYROID   Recent Labs     11/01/19  1739 07/28/19  1500   HA1C 6.3* 6.4*    No value within the past 365 days      Pending/Orders Labs:  Lab Frequency Next Occurrence   TSH Once 12/02/2019   Microalbumin, Urine, Random Once 12/02/2019   Lipid Panel (Reflex to Direct  LDL if Triglycerides more than 400) Once 12/02/2019   Comprehensive metabolic panel Once 54/27/0623   CBC and differential Once 12/02/2019   Hemoglobin A1c Once 03/06/2020   Hemoglobin A1c Every 3 months + PRN

## 2020-04-13 NOTE — Progress Notes (Signed)
Behavioral Health Progress Note   Length of Session: 45 minutes    Contact Type:  Location: On Site    Face to Face     Problem(s)/Goals Addressed from Treatment Plan:    Problem 1:   Treatment Problem #1 02/03/2020   Patient Identified Problem Depression and anxiety       Goal for this problem:    Treatment Goal #1 02/03/2020   Patient Identified Goal Reduce symptoms of depression and anxiety       Progress towards this goal: Patient reports additional stressors.    Mental Status Exam:  APPEARANCE: Appears stated age, Casual  ATTITUDE TOWARD INTERVIEWER: Cooperative  MOTOR ACTIVITY: WNL (within normal limits)  EYE CONTACT: Direct  SPEECH: Normal rate and tone  AFFECT: Full Range  MOOD: Anxious and Normal  THOUGHT PROCESS: Normal  THOUGHT CONTENT: No unusual themes  PERCEPTION: No evidence of hallucinations  CURRENT SUICIDAL IDEATION: patient denies  CURRENT HOMICIDAL IDEATION: Patient denies  ORIENTATION: Alert and Oriented X 3.  CONCENTRATION: Good  MEMORY:   Recent: intact   Remote: intact  COGNITIVE FUNCTION: Average intelligence  JUDGMENT: Intact  IMPULSE CONTROL: Good and Fair  INSIGHT: Good and Fair    Risk Assessment:  ASSESSMENT OF RISK FOR SUICIDAL BEHAVIOR  Changes in risk for suicide from baseline Formulation of Risk and/or previous intake, including newly identified risk, if any: none  Violence risk was assessed and No Change noted from baseline formulation of risk and/or previous assessment.    Session Content:: Patient reports additional stressors. Patient said that she has been feeling numb. She said that she spent her birthday in ICU with Shari's aunt who subsequently passed away. Patient said that she doesn't understand why she doesn't feel "anything". She said that she didn't even care about her birthday. Patient told Probation officer that the vacation at her brother[place] was [place] and she is liking her brother's new girlfriend. She said that her brother is returning to the way he used  to be and this is heartening. Writer provided support and empathetic feedback regarding patient's first holidays without her father and the anniversary of his death coming up. Patient said that she took a xanax yesterday because she didn't know how else to cope. Writer suggested this might be the reason patient's emotions were tamped down. Writer and patient discussed patient's plans for therapy. Patient said that she would not continue in clinic and writer advised patient of the process to transition out of clinic treatment.     Visit Diagnosis:      ICD-10-CM ICD-9-CM   1. Chronic post-traumatic stress disorder (PTSD)  F43.12 309.81   2. MDD (major depressive disorder), recurrent episode, moderate  F33.1 296.32   3. Mixed obsessional thoughts and acts  F42.2 300.3   4. Bereavement counseling  Z71.89 V65.40       Interventions:  Bereavement Therapy  Supportive Psychotherapy  Taught/practiced coping skills (specify skills used):  Mindfulness, breathing, grounding  Taught/practiced communication skills (specify skills used):  Interpersonal, observe limits, setting boundaries.    Current Treatment Plan   Created/Updated On 02/03/2020   Next Treatment Plan Due 08/01/2020     Plan:  Psychotherapy continues as described in care plan; plan remains the same.    NEXT APPT: N/A    Tresa Res, LCSW-R

## 2020-04-16 ENCOUNTER — Other Ambulatory Visit: Payer: Self-pay | Admitting: Psychiatry

## 2020-04-16 DIAGNOSIS — E1165 Type 2 diabetes mellitus with hyperglycemia: Secondary | ICD-10-CM

## 2020-04-21 ENCOUNTER — Other Ambulatory Visit: Payer: Self-pay | Admitting: Primary Care

## 2020-04-21 MED ORDER — GLUCOSE BLOOD VI STRP *A*
ORAL_STRIP | 11 refills | Status: AC
Start: 2020-04-21 — End: ?

## 2020-04-21 MED ORDER — QUETIAPINE FUMARATE 50 MG PO TB24 *I*
50.0000 mg | ORAL_TABLET | Freq: Every evening | ORAL | 3 refills | Status: DC
Start: 2020-04-21 — End: 2021-03-02

## 2020-04-21 NOTE — Telephone Encounter (Signed)
Search Terms: Megan Presti, July 28, 1975   Search Date: 04/21/2020 14:26:27 PM   Searching on behalf of: [id number] - [name]   The Drug Utilization Report below displays all of the controlled substance prescriptions, if any, that your patient has filled in the last twelve months. The information displayed on this report is compiled from pharmacy submissions to the Department, and accurately reflects the information as submitted by the pharmacies.  This report was requested by: Phineas Real   Reference #: [id number]   Others' Prescriptions  Patient Name: Kaylee Harris   Birth Date: [birth year]   Address: [place], [place] [address]   Sex: Female   Rx Written Rx Dispensed Drug Quantity Days Supply Prescriber Name Prescriber [place] # Payment Method Dispenser   03/14/2020 03/16/2020 alprazolam 1 mg tablet  15 35 Reginold Agent [id number] [place]. #19   02/22/2020 02/23/2020 tramadol hcl 50 mg tablet  42 14 Reginold Agent [id number] [place]. #02   01/28/2020 02/02/2020 alprazolam 1 mg tablet  15 15 [name], Darnell Level (MD) [place]. #19   12/15/2019 12/23/2019 tramadol hcl 50 mg tablet  42 14 Graceann Congress [id number] [place]. #02   12/08/2019 12/13/2019 tramadol hcl 50 mg tablet  42 14 Graceann Congress [id number] [place]. #02   11/11/2019 11/18/2019 tramadol hcl 50 mg tablet  42 14 Graceann Congress [id number] [place]. #19   09/22/2019 09/27/2019 tramadol hcl 50 mg tablet  42 14 Graceann Congress [id number] [place]. #19   07/28/2019 08/02/2019 tramadol hcl 50 mg tablet  42 14 Reginold Agent [id number] [place]. #19   07/13/2019 07/13/2019 hydrocodone-acetaminophen 5-325 mg tablet  30 5 Melody Comas DMD [place]. #02   06/24/2019 06/28/2019 tramadol hcl 50 mg  tablet  42 14 Reginold Agent [id number] [place]. #19   06/11/2019 06/11/2019 oxycodone hcl 5 mg tablet  6 3 Rene Kocher, [place] [id number] Insurance [place] #02   05/12/2019 05/19/2019 tramadol hcl 50 mg tablet  42 14 Graceann Congress [id number] [place]. #19          Last office visit:   03/09/2020  Patients upcoming appointments:  Future Appointments   Date Time Provider [place]   04/25/2020  3:00 PM [name], MD CAF None   07/06/2020  2:30 PM Nada Libman, NP HEC None   08/03/2020  2:00 PM [name], MD CAF None       Recent Lab Values 04/14/2019 01/04/2018 12/17/2017 10/17/2017 10/17/2017 03/31/2017 10/29/2016   EXP DATE 2019-08-04 9/20 02/02/2018 11-03-18 08-04-18 04/04/18 04/04/2017   THCU - - - - - - -       Recent Lab results:  GENERAL CHEMISTRY   No value within the past 365 days   LIPID PROFILE   Recent Labs     10/29/19  1444   CHOL 124   TRIG 91   HDL 38*   LDLC 68      LIVER PROFILE   No value within the past 365 days   DIABETES THYROID   Recent Labs     11/01/19  1739 07/28/19  1500   HA1C 6.3* 6.4*    No value within the past 365 days      Pending/Orders Labs:  Lab Frequency Next Occurrence  TSH Once 12/02/2019   Microalbumin, Urine, Random Once 12/02/2019   Lipid Panel (Reflex to Direct  LDL if Triglycerides more than 400) Once 12/02/2019   Comprehensive metabolic panel Once 64/40/3474   CBC and differential Once 12/02/2019   Hemoglobin A1c Once 03/06/2020   Hemoglobin A1c Every 3 months + PRN

## 2020-04-25 ENCOUNTER — Ambulatory Visit: Payer: Medicare (Managed Care) | Attending: Primary Care | Admitting: Primary Care

## 2020-04-25 ENCOUNTER — Encounter: Payer: Self-pay | Admitting: Primary Care

## 2020-04-25 ENCOUNTER — Other Ambulatory Visit
Admission: RE | Admit: 2020-04-25 | Discharge: 2020-04-25 | Disposition: A | Discharge status: Home / Self Care | Payer: Medicare (Managed Care) | Source: Ambulatory Visit

## 2020-04-25 VITALS — BP 116/82 | HR 85 | Temp 98.2°F | Ht 67.99 in | Wt 236.6 lb

## 2020-04-25 DIAGNOSIS — F32A Depression, unspecified: Secondary | ICD-10-CM

## 2020-04-25 DIAGNOSIS — E119 Type 2 diabetes mellitus without complications: Secondary | ICD-10-CM

## 2020-04-25 DIAGNOSIS — E11649 Type 2 diabetes mellitus with hypoglycemia without coma: Secondary | ICD-10-CM

## 2020-04-25 DIAGNOSIS — F419 Anxiety disorder, unspecified: Secondary | ICD-10-CM | POA: Insufficient documentation

## 2020-04-25 DIAGNOSIS — R5383 Other fatigue: Secondary | ICD-10-CM

## 2020-04-25 DIAGNOSIS — Z23 Encounter for immunization: Secondary | ICD-10-CM

## 2020-04-25 LAB — CBC AND DIFFERENTIAL
Baso # K/uL: 0.1 10*3/uL (ref 0.0–0.1)
Basophil %: 0.5 %
Eos # K/uL: 0.1 10*3/uL (ref 0.0–0.4)
Eosinophil %: 0.8 %
Hematocrit: 42 % (ref 34–45)
Hemoglobin: 13.2 g/dL (ref 11.2–15.7)
IMM Granulocytes #: 0.1 10*3/uL — ABNORMAL HIGH (ref 0.0–0.0)
IMM Granulocytes: 0.4 %
Lymph # K/uL: 3 10*3/uL (ref 1.2–3.7)
Lymphocyte %: 24.8 %
MCH: 29 pg (ref 26–32)
MCHC: 32 g/dL (ref 32–36)
MCV: 91 fL (ref 79–95)
Mono # K/uL: 0.7 10*3/uL (ref 0.2–0.9)
Monocyte %: 5.6 %
Neut # K/uL: 8.3 10*3/uL — ABNORMAL HIGH (ref 1.6–6.1)
Nucl RBC # K/uL: 0 10*3/uL (ref 0.0–0.0)
Nucl RBC %: 0 /100 WBC (ref 0.0–0.2)
Platelets: 347 10*3/uL (ref 160–370)
RBC: 4.6 MIL/uL (ref 3.9–5.2)
RDW: 13.1 % (ref 11.7–14.4)
Seg Neut %: 67.9 %
WBC: 12.2 10*3/uL — ABNORMAL HIGH (ref 4.0–10.0)

## 2020-04-25 LAB — MICROALBUMIN, URINE, RANDOM
Creatinine,UR: 109 mg/dL (ref 20–300)
Microalbumin,UR: <1.2 mg/dL

## 2020-04-25 LAB — TSH: TSH: 1.03 u[IU]/mL (ref 0.27–4.20)

## 2020-04-25 LAB — COMPREHENSIVE METABOLIC PANEL
ALT: 25 U/L (ref 0–35)
AST: 18 U/L (ref 0–35)
Albumin: 4.6 g/dL (ref 3.5–5.2)
Alk Phos: 105 U/L (ref 35–105)
Anion Gap: 12 (ref 7–16)
Bilirubin,Total: 0.4 mg/dL (ref 0.0–1.2)
CO2: 24 mmol/L (ref 20–28)
Calcium: 9.5 mg/dL (ref 8.6–10.2)
Chloride: 104 mmol/L (ref 96–108)
Creatinine: 0.97 mg/dL — ABNORMAL HIGH (ref 0.51–0.95)
GFR,Black: 81 *
GFR,Caucasian: 70 *
Glucose: 79 mg/dL (ref 60–99)
Lab: 10 mg/dL (ref 6–20)
Potassium: 4.5 mmol/L (ref 3.3–5.1)
Sodium: 140 mmol/L (ref 133–145)
Total Protein: 6.7 g/dL (ref 6.3–7.7)

## 2020-04-25 LAB — LIPID PANEL
Chol/HDL Ratio: 3.2
Cholesterol: 122 mg/dL
HDL: 38 mg/dL — ABNORMAL LOW (ref 40–60)
LDL Calculated: 66 mg/dL
Non HDL Cholesterol: 84 mg/dL
Triglycerides: 92 mg/dL

## 2020-04-25 NOTE — Progress Notes (Signed)
[facility]            SUBJECTIVE    Pt is here to discuss:    Chief Complaint   Patient presents with    Diabetes         1.  DM - Adherent with medications. Does  check BGs. Denies polyuria, polydipsia or hypoglycemia sx. Denies peripheral paresthesias. Does   check feet, denies foot lesions or concerns. Wasn't sure when to update bloodwork  Lab Results   Component Value Date    HA1C 6.3 (H) 11/01/2019    HA1C 6.4 (H) 07/28/2019    HA1C 6.7 (H) 11/25/2018    MALBR <1.20 11/01/2019    CREAT 0.81 04/14/2019    LDLC 68 10/29/2019         2. FU depression - having hard time with holidays, mom's illnesses, upcoming anniversary of father's death. Had thought last night that it may be better off if she's "with him". Still curious what he was trying to say to her when he passed away. Working with therapist, but interested in grief counseling. Mild improvement in symptoms with cymbalta increase in September, but no effect on back pain. Struggling with sleep - fatigued and falls asleep by 7pm but then up around 1-3am and unable to fall asleep for hours.           PMH / Family Hx / Social Hx  Patient's medications, allergies, problem list, past medical, social histories were reviewed and notable for:    Current Outpatient Medications   Medication Sig Note    blood glucose test strip Test  8 times a day.   Brand name of strips Contour NEXT test strips for linked omnipod pump     QUEtiapine (SEROQUEL XR) 50 mg 24 hr tablet Take 1 tablet (50 mg total) by mouth nightly  Swallow whole. Do not chew, crush, or break.     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     traMADol (ULTRAM) 50 mg tablet TAKE 1 TABLET BY MOUTH EVERY 8 HOURS AS NEEDED FOR HEAD AND ABDOMINAL PAIN MAXIMUM DAILY DOSE OF 3 TABLETS PER DAY     famotidine (PEPCID) 20 MG tablet TAKE 1 TABLET BY MOUTH EVERY EVENING AS NEEDED FOR HEARTBURN     DULoxetine (CYMBALTA) 30 MG DR capsule Take 1 capsule (30 mg total) by mouth daily  Take with 69m dose to total 953mdaily     lamoTRIgine (LAMICTAL) 200 MG tablet Take 1 tablet (200  mg total) by mouth daily Replacing 137m dose     DULoxetine (CYMBALTA) 60 MG DR capsule Take 1 capsule (60 mg total) by mouth daily Replacing 381mdose     Insulin Disposable Pump (OMNIPOD DASH 5 PACK PODS) MISC By 1 device no specified route as needed Change every 2 days = 45 pods for 90 days supply = 9 (5packs)     SUMAtriptan (IMITREX) 50 MG tablet TAKE 1 TABLET BY MOUTH AS NEEDED FOR MIGRAINE, TAKE AT ONSET OF HEADACHE, MAY REPEAT ONCE IN 2 HOURS     promethazine (PHENERGAN) 25 MG tablet Take 1 tablet (25 mg total) by mouth every 4-6 hours as needed     triamcinolone (KENALOG) 0.5 % cream Apply topically 2 times daily to the following areas: arms     dulaglutide (TRULICITY) 0.3.97GQB/3.4LPen Inject 0.5 mLs (0.75 mg total) into the skin every 7 days     Alcohol Swabs (SM ALCOHOL PREP) 70 % PADS USE 1 PAD TOPICALLY TWO TIMES DAILY TO CHECK BLOOD GLUCOSE     clindamycin (CLEOCIN T) 1 % external solution APPLY TOPICALLY TWO TIMES DAILY, CONTINUE UNTIL IMPROVEMENT IN CYSTS     dicyclomine  (BENTYL) 10 MG capsule Take 1 capsule (10 mg total) by mouth 4 times daily (before meals and nightly) TAKE 1 CAPSULE BY MOUTH FOUR TIMES DAILY BEFORE MEALS AND NIGHTLY     ascorbic acid (VITAMIN C) 100 MG tablet Take 5 tablets (500 mg total) by mouth daily     Continuous Blood Gluc Transmit (DEXCOM G6 TRANSMITTER) Attach to sensor. Change every 90 days     Continuous Blood Gluc Sensor (DEXCOM G6 SENSOR) Inject into the skin as needed (change every 10 days)     fluticasone (FLONASE) 50 MCG/ACT nasal spray Spray 1 spray into nostril daily     Continuous Blood Gluc Receiver (DEXCOM G6 RECEIVER) By no specified route as needed     pantoprazole (PROTONIX) 40 MG EC tablet Take 1 tablet (40 mg total) by mouth daily Swallow whole. Do not crush, break, or chew.     benzoyl peroxide 10 % LIQD external liquid Apply topically 2 times daily to the following areas: under arms, under breast, groin     LIDOCAINE HCL URETHRAL/MUCOSAL 2 % jelly APPLY TOPICALLY THREE TIMES DAILY AS NEEDED FOR PAIN     insulin lispro 100 UNIT/ML injection vial Inject as directed via pump up to Maximum  60  Units/day     busPIRone (BUSPAR) 30 MG tablet Take 1 tablet (30 mg total) by mouth 2 times daily     topiramate (TOPAMAX) 100 MG tablet Take 1 tablet (100 mg total) by mouth 2 times daily     insulin syringe-needle U-100 (BD INSULIN SYRINGE U/F) 31G X 5/16" 0.3 ML HALF-UNIT USE THREE TIMES DAILY AS INSTRUCTED     diphenoxylate-atropine (LOMOTIL) 2.5-0.025 MG per tablet Take 1-2 tablets by mouth 4 times daily as needed for Diarrhea Max daily dose: 8 tablets Code D     blood pressure monitor, automatic with arm cuff Use as directed to monitor blood pressure     incontinence supply disposable Order Description:  Wear as needed for stool leakage     zinc oxide 20 % ointment Apply topically as needed for Dry Skin     Continuous Blood Gluc Receiver (DEXCOM G6 RECEIVER) DEVI By 1 Device no specified route as needed     glucagon (BAQSIMI) 3  MG/DOSE nasal powder Inhale one dose (3 mg)  into one nostril once as needed for low blood sugar. If no response in 15 min, inhale second dose from new device     lancets (BAYER MICROLET) Use   8  times per day as instructed for blood glucose testing.     azelastine (OPTIVAR) 0.05 % ophthalmic solution Place 1 drop into both eyes 2 times daily     SUMAtriptan refill (IMITREX STATDOSE) 6 MG/0.5ML injection INJECT 0.5ML UNDER THE SKIN ONCE AS NEEDED FOR MIGRAINE. MAY REPEAT DOSE ONCE AFTER 1 HOUR     glucagon (GLUCAGEN) 1 MG injection kit Inject 1 mg into the skin once as needed for Low blood sugar   Discard any unused portion.     atorvastatin (LIPITOR) 40 MG tablet Take 1 tablet (40 mg total) by mouth daily (with dinner)     metoprolol (TOPROL-XL) 25 MG 24 hr tablet Take by mouth daily    06/10/2016: Received from: External Pharmacy    midodrine (PROAMATINE) 10 MG tablet Take 1 tablet (10 mg total) by mouth 3 times daily     Non-System Medication The above patient is followed in our clinic and cannot resume work permanently.         OBJECTIVE  Vitals:    04/25/20 1517   BP: 116/82   Pulse: 85   Temp: 36.8 C (98.2 F)   SpO2: 98%   Weight: 107.3 kg (236 lb 9.6 oz)   Height: 1.727 m (5' 7.99")     Body mass index is 35.99 kg/m.      General: well-appearing Caucasian female, pleasant & conversant, in NAD   Psych: AAOx3, tearful affect and mood. Immediately honest with me as I enter room, honest with spouse who joins her later. Insight and judgement intact.           ASSESSMENT & PLAN  1. Diabetes type 2, controlled  Encouraged updating bloodwork upon leaving today    2. Anxiety and depression  Discussed uncontrolled grief in setting of traumatic death of father last year. Given specific grief resources. Pt had passive SI yesterday without plan. Future oriented today and fearful of "going backwards" and having severe depression like years ago. Validated pt's feelings today that they may be a 'normal' expected  reaction to the traumatic death and missing her father at this time of year. Will f/u pending response to grief counseling. No med adjustment made today.    3. Other fatigue  Encouraged sleep study to r/o OSA given risk factors  - AMB REFERRAL TO SLEEP MEDICINE    4. Immunization due   Discussed current data shows decreased vaccine effectiveness at 455month     - Covid-19 mRNA vaccine (PFIZER) IM 30 mcg/0.3 mL    5. Need for immunization against influenza  Pt denies current illness/fever, allergy to egg or flu vaccine component, h/o previous serious reaction to the flu vaccine, h/o Guillain-Barre. I have educated the patient and/or parent/guardian/designee about each component in all the immunizations and toxoids that are being given today, have reviewed potential vaccine side effects and have answered their questions about the vaccinations.   - Flu vaccine quadrivalent greater than or equal to 619moreservative free IM          Follow-up: 55m91mo   JilJohny DrillingD  [place]2/21/2021  6:16 PM        ______________________

## 2020-04-25 NOTE — Patient Instructions (Signed)
The programs helpful for beginning meditation are "Insight Timer," and "Headspace."    This article walks you through how to begin to meditate:  https://www.nytimes.com/guides/well/how-to-meditate      A useful small 3 breath micro-practice is as follows:     1- Take a deep breath and pay attention to your breathing.    2- With your second breath, let your body relax   3- With your third breath, focus on only what is important at this moment.     Be kind and patient with yourself.    Apps for Therapy, Counseling, and Cognitive behavioral therapy     There is good evidence that apps and websites for cognitive behavioral therapy can help in the treatment of anxiety and depression.  The following are Apps recommended by family doctors to help with mood including improving symptoms of depression and anxiety and even insomnia.       MoodMission  This app recommends strategies based on cognitive behavioral therapy after input of low moods or feelings of anxiety.  MoodMission provides 5 "missions" to engage in that promote confidence in handling stressors and promotes coping self-efficacy.  The app learns what style works best and tailors techniques accordingly to when a patient uses it most frequently.  Rewards in the app are used to promote motivation and to increase pleasure and self-confidence.  It is useful for patients who could use a lift in mood or decrease in symptoms of anxiety or depression.    Free in Apple's App Store and Google Play    What's Up  Based on principles from cognitive behavioral therapy and Acceptance and Commitment Therapy (ACT), What's Up identifies common negative thinking pattersn and methods to overcome then with useful metaphors, a catastrophe scale, grounding techniques, and breathing exercises.  What's Up syncs data across multiple devices and uses a pass code to protect this info.   Free in App Store and Google Play    Moodpath  Moodpath uses daily screenings to create better  understanding of thoughts, feeling and emotions.  If needed, it also provides a discussion guide to talking with a medical professional based on answers to its daily screenings.  Included in the app are > 150 psychological exercises and videos to promote and strengthen overall mental health.   Free in App Store and Google Play    MindShift (designed for youth and young adults)  Designed to assist youth and young adults in coping with anxiety, MindShift constructs an individualized toolbox to help individuals deal with test anxiety, perfectionism, social anxiety, worry, panic and conflict.  It is helpful in helping teens and young adults learn about helpful and unhelpful anxiety, as well as to overcome fears by gradually facing them in manageable steps.    Free in App Store and Google Play    CBT-I Coach (for Insomnia as well)   This app, based on principles of cognitive behavioral therapy for insomnia, is a structured program to learn about sleep, develop positive sleep routines, and improve sleep environment.  Useful as a starting point in treating symptoms of insomnia.   Free on App Store and Google Play    MoodKit  This is a cognitive behavioral therapy app with 4 main tools:   -coping self efficacy that includes individual productivity, social relationships, physical activity, and healthy habits  -a thought checker  -mood tracker  -journal  $4.99 in Apple's App Store    Moodnotes  Based on cognitive behavioral therapy and positive psycholoy, Moodnotes   assists in recognizing and learning about "traps" in thinking, as well as emphasizing healthier thinking habits.   Examples of traps in thinking:   --catastrophic thinking: where a person may think that a small error or behavior may lead to a consequence that far exceeds what is likely  --mind reading: where a person assumes that other are critical of them without actually having evidence that this is the case   Moodnotes tracks mood over a period of time while  identifying factors that influence it.    $4.99 in Apple's App Store    Getselfhelp.co.uk (website helpful for patients and clinicians)  This website provides free self-help and therapy resources.  It is helpful for clinicians and patients in identifying an area of need and creating an action plan.   Free of Cost    Source: Family Practice News 01/2018

## 2020-04-26 LAB — HEMOGLOBIN A1C: Hemoglobin A1C: 6.3 % — ABNORMAL HIGH

## 2020-05-02 ENCOUNTER — Encounter: Payer: Self-pay | Admitting: Primary Care

## 2020-05-02 DIAGNOSIS — Z20822 Contact with and (suspected) exposure to covid-19: Secondary | ICD-10-CM

## 2020-05-07 ENCOUNTER — Other Ambulatory Visit: Payer: Self-pay | Admitting: Family Medicine

## 2020-05-07 ENCOUNTER — Other Ambulatory Visit: Payer: Self-pay | Admitting: Primary Care

## 2020-05-07 DIAGNOSIS — Z760 Encounter for issue of repeat prescription: Secondary | ICD-10-CM

## 2020-05-07 MED ORDER — TOPIRAMATE 100 MG PO TABS *I*
100.0000 mg | ORAL_TABLET | Freq: Two times a day (BID) | ORAL | 3 refills | Status: DC
Start: 2020-05-07 — End: 2020-07-18

## 2020-05-07 MED ORDER — TRAMADOL HCL 50 MG PO TABS *I*
50.0000 mg | ORAL_TABLET | Freq: Three times a day (TID) | ORAL | 0 refills | Status: DC | PRN
Start: 2020-05-07 — End: 2020-06-09

## 2020-05-07 NOTE — Telephone Encounter (Signed)
MEDICATION REFILL REQUEST    [facility] [address] [place] AT n/a  [address] [place] [address]  [place] [address]  Phone: [phone] Fax: [phone]    [place], [place]  [address]  [place] [address]  Phone: [phone] Fax: [place]  [place] [address]  Phone: [phone] Fax: [phone]      What Medication?    Current Dose and Frequency?    How Much (30 OR 90 DAYS)?,   * GIVE 1 YEAR SUPPLY (11 (for 30day) or 3 (for 90 day) refills)      Last Appointment: 04/25/2020  Next Appointment:   Future Appointments   Date Time Provider [place]   07/06/2020  2:30 PM Nada Libman, NP [place] None   08/03/2020  2:00 PM [name], MD [facility] None       If no appt listed, please make sure they have at least a 1 year follow up        Lab results: 04/25/20  1637   WBC 12.2*   Hemoglobin 13.2   Hematocrit 42   RBC 4.6   Platelets 347        Lab Results   Component Value Date    HA1C 6.3 (H) 04/25/2020         Lab results: 04/25/20  1637   TSH 1.03           Lab results: 04/25/20  1637   Sodium 140   Potassium 4.5   Chloride 104   CO2 24   UN 10   Creatinine 0.97*   GFR,Caucasian 70   GFR,Black 81   Glucose 79   Calcium 9.5   Total Protein 6.7   Albumin 4.6   ALT 25   AST 18   Alk Phos 105   Bilirubin,Total 0.4

## 2020-05-07 NOTE — Telephone Encounter (Signed)
This report was requested by: Maurice March   Reference #: [id number]   Others' Prescriptions  Patient Name: Kaylee Harris   Birth Date: Apr 12, 1976   Address: [place], [place] [address]   Sex: Female   Rx Written Rx Dispensed Drug Quantity Days Supply Prescriber Name Prescriber [place] # Payment Method Dispenser   03/14/2020 03/16/2020 alprazolam 1 mg tablet  15 35 Reginold Agent [id number] [place]. #19   02/22/2020 02/23/2020 tramadol hcl 50 mg tablet  42 14 Reginold Agent [id number] [place]. #02   01/28/2020 02/02/2020 alprazolam 1 mg tablet  15 15 [name], Darnell Level (MD) [place]. #19   12/15/2019 12/23/2019 tramadol hcl 50 mg tablet  42 14 Graceann Congress [id number] [place]. #02   12/08/2019 12/13/2019 tramadol hcl 50 mg tablet  42 14 Graceann Congress [id number] [place]. #02   11/11/2019 11/18/2019 tramadol hcl 50 mg tablet  42 14 Graceann Congress [id number] [place]. #19   09/22/2019 09/27/2019 tramadol hcl 50 mg tablet  42 14 Graceann Congress [id number] [place]. #19   07/28/2019 08/02/2019 tramadol hcl 50 mg tablet  42 14 Reginold Agent [id number] [place]. #19   07/13/2019 07/13/2019 hydrocodone-acetaminophen 5-325 mg tablet  30 5 Melody Comas DMD [place]. #02   06/24/2019 06/28/2019 tramadol hcl 50 mg tablet  42 14 Reginold Agent [id number] [place]. #19   06/11/2019 06/11/2019 oxycodone hcl 5 mg tablet  6 3 Rene Kocher, Mirian Mo MD [id number] Insurance [place] #02   05/12/2019 05/19/2019 tramadol hcl 50 mg tablet  42 14 Graceann Congress [id number] [place]. #19

## 2020-05-08 ENCOUNTER — Encounter: Payer: Self-pay | Admitting: Primary Care

## 2020-05-09 ENCOUNTER — Ambulatory Visit: Payer: Medicare (Managed Care) | Attending: Nurse Practitioner

## 2020-05-09 DIAGNOSIS — Z20828 Contact with and (suspected) exposure to other viral communicable diseases: Secondary | ICD-10-CM | POA: Insufficient documentation

## 2020-05-09 DIAGNOSIS — Z20822 Contact with and (suspected) exposure to covid-19: Secondary | ICD-10-CM | POA: Insufficient documentation

## 2020-05-10 LAB — COVID-19 NAAT (PCR): COVID-19 NAAT (PCR): NEGATIVE

## 2020-05-10 LAB — COVID-19 PCR

## 2020-05-11 ENCOUNTER — Encounter: Payer: Self-pay | Admitting: Gastroenterology

## 2020-05-21 ENCOUNTER — Other Ambulatory Visit: Payer: Self-pay | Admitting: Primary Care

## 2020-05-21 NOTE — Telephone Encounter (Signed)
Last office visit:   04/25/2020  Patients upcoming appointments:  Future Appointments   Date Time Provider [place]   07/06/2020  2:30 PM Nada Libman, NP [facility] None   08/03/2020  2:00 PM [name], MD [facility] None       Recent Lab Values 04/14/2019 01/04/2018 12/17/2017 10/17/2017 10/17/2017 03/31/2017 10/29/2016   EXP DATE 2019-08-04 9/20 02/02/2018 11-03-18 08-04-18 04/04/18 04/04/2017   THCU - - - - - - -       Recent Lab results:  GENERAL CHEMISTRY   Recent Labs     04/25/20  1637   NA 140   K 4.5   CL 104   CO2 24   GAP 12   UN 10   CREAT 0.97*   GFRC 70   GFRB 81   GLU 79   CA 9.5      LIPID PROFILE   Recent Labs     04/25/20  1637 10/29/19  1444   CHOL 122 124   TRIG 92 91   HDL 38* 38*   LDLC 66 68      LIVER PROFILE   Recent Labs     04/25/20  1637   ALT 25   AST 18   ALK 105   TB 0.4      DIABETES THYROID   Recent Labs     04/25/20  1637 11/01/19  1739 07/28/19  1500   HA1C 6.3* 6.3* 6.4*    Recent Labs     04/25/20  1637   TSH 1.03         Pending/Orders Labs:  Lab Frequency Next Occurrence   Hemoglobin A1c Every 3 months + PRN

## 2020-05-22 MED ORDER — DICYCLOMINE HCL 10 MG PO CAPS *I*
10.0000 mg | ORAL_CAPSULE | Freq: Four times a day (QID) | ORAL | 1 refills | Status: DC
Start: 2020-05-22 — End: 2021-01-18

## 2020-05-30 ENCOUNTER — Encounter: Payer: Self-pay | Admitting: Obstetrics and Gynecology

## 2020-05-30 ENCOUNTER — Encounter: Payer: Self-pay | Admitting: Student in an Organized Health Care Education/Training Program

## 2020-05-30 ENCOUNTER — Ambulatory Visit: Payer: Medicare (Managed Care) | Admitting: Student in an Organized Health Care Education/Training Program

## 2020-05-30 ENCOUNTER — Other Ambulatory Visit: Payer: Self-pay

## 2020-05-30 VITALS — BP 108/70 | Ht 67.0 in | Wt 236.2 lb

## 2020-05-30 DIAGNOSIS — Z9109 Other allergy status, other than to drugs and biological substances: Secondary | ICD-10-CM

## 2020-05-30 DIAGNOSIS — L732 Hidradenitis suppurativa: Secondary | ICD-10-CM

## 2020-05-30 DIAGNOSIS — N764 Abscess of vulva: Secondary | ICD-10-CM

## 2020-05-30 MED ORDER — CETIRIZINE HCL 10 MG PO TABS *I*
10.0000 mg | ORAL_TABLET | Freq: Two times a day (BID) | ORAL | 1 refills | Status: DC
Start: 2020-05-30 — End: 2020-07-28

## 2020-05-30 NOTE — Telephone Encounter (Signed)
Last office visit:   04/25/2020  Patients upcoming appointments:  Future Appointments   Date Time Provider [place]   06/06/2020 11:30 AM Paraison, Cleda Clarks, MD [facility] None   07/06/2020  2:30 PM Nada Libman, NP [facility] None   08/03/2020  2:00 PM [name], MD [facility] None       Recent Lab Values 04/14/2019 01/04/2018 12/17/2017 10/17/2017 10/17/2017 03/31/2017 10/29/2016   EXP DATE 2019-08-04 9/20 02/02/2018 11-03-18 08-04-18 04/04/18 04/04/2017   THCU - - - - - - -       Recent Lab results:  GENERAL CHEMISTRY   Recent Labs     04/25/20  1637   NA 140   K 4.5   CL 104   CO2 24   GAP 12   UN 10   CREAT 0.97*   GFRC 70   GFRB 81   GLU 79   CA 9.5      LIPID PROFILE   Recent Labs     04/25/20  1637 10/29/19  1444   CHOL 122 124   TRIG 92 91   HDL 38* 38*   LDLC 66 68      LIVER PROFILE   Recent Labs     04/25/20  1637   ALT 25   AST 18   ALK 105   TB 0.4      DIABETES THYROID   Recent Labs     04/25/20  1637 11/01/19  1739 07/28/19  1500   HA1C 6.3* 6.3* 6.4*    Recent Labs     04/25/20  1637   TSH 1.03         Pending/Orders Labs:  Lab Frequency Next Occurrence   Hemoglobin A1c Every 3 months + PRN

## 2020-05-30 NOTE — Progress Notes (Signed)
[facility] Visit  Location: [facility]     Subjective     Kaylee Harris is a 45 y.o. G0P0000 who presents to discuss labial cysts. 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 She has a diagnosis of hidradenitis suppurativa and she is followed by [facility]. Patient presents for an acute visit today because 5 days ago she noticed 2 painful, labial bumps. She has a small one on the left side that is spontaneously draining and she has another on the right side that is enlarging and becoming more painful. She reports that it feels like a small "ball" and it is significantly tender. Patient has tried tylenol and sitz baths without relief. She has doxycycline prescribed by her Dermatologist that she has been taking for the past 5 days without improvement. Patient denies fevers, chills, nausea or vomiting, She is uncertain if this is related to her hidradenitis because the lesions are not on a hair-bearing area. Patient does not shave her vulva. Of note, patient has a history of T2DM, however, it is currently well controlled. Her last A1C a month ago was 6.3% and she has lost over 60 lbs.    Patient's medications, allergies, past medical, surgical, obstetrical, gynecologic, social and family histories were reviewed and updated as appropriate.    General ROS: negative   Respiratory ROS: no cough, shortness of breath, or wheezing  Cardiovascular ROS: no chest pain or dyspnea on exertion  Gastrointestinal ROS: no abdominal pain, change in bowel habits, or black or bloody stools  Urinary ROS: no dysuria, trouble voiding, or hematuria  Musculoskeletal ROS: negative    Objective     BP 108/70    Ht 1.702 m (5\' 7" )    Wt 107.1 kg (236 lb 3.2 oz)    LMP  (LMP Unknown)    BMI 36.99 kg/m      General: well developed and well nourished, in no acute distress      Pelvic:   VULVA: left labia minora with  0.5 cm cyst on the superior aspect spontaneously draining and non-tender; right labia minora with 1x1 cm firm, well-circumscribed, indurated lesion significantly tender and erythematous without fluctuance or drainage    Examination chaperoned by Fenton Malling, MD.        Assessment     Kaylee Harris is an 45 y.o. G0P0000 presenting to the office for bilateral labial abscesses.    1. Labial abscess     2. Hidradenitis           Plan     Labia [place] Abscesses  - Left lesion spontaneously draining, right lesion firm without an area of fluctuance; no indication for I&D  - Advised patient to continue sitz baths, warm compresses, and tylenol for pain control  - Patient instructed on when to call or represent urgently to care  - Plan to follow-up in 1 week to re-assess for possible I&D    Dispo:  She will follow-up in 1 week    Patient seen and discussed with Dr. Jobie Quaker.    Stephanie Coup, MD  Obstetrics and Gynecology PGY-4  Pager: [phone]

## 2020-06-05 ENCOUNTER — Ambulatory Visit: Payer: Medicare (Managed Care) | Admitting: Obstetrics and Gynecology

## 2020-06-05 ENCOUNTER — Other Ambulatory Visit: Payer: Self-pay

## 2020-06-05 NOTE — Progress Notes (Signed)
[facility]  Monthly Summary Note    Contact Achieved with Member This Month: 1/6   Method of Contact: phone   New Presenting Concerns: having issues with hot water heater   Care Manager Interventions: CM provided contact information for [facility] home improvement program.   Plan for Follow Up: CM will follow up with program agency as needed.

## 2020-06-06 ENCOUNTER — Ambulatory Visit: Payer: Medicare (Managed Care) | Admitting: Student in an Organized Health Care Education/Training Program

## 2020-06-06 ENCOUNTER — Encounter: Payer: Self-pay | Admitting: Student in an Organized Health Care Education/Training Program

## 2020-06-06 VITALS — BP 98/62 | Ht 67.0 in | Wt 233.8 lb

## 2020-06-06 DIAGNOSIS — N764 Abscess of vulva: Secondary | ICD-10-CM

## 2020-06-06 NOTE — Procedures (Signed)
[facility] Procedure Note: Labial Cyst Incision and Drainage    Pre-op diagnosis: Right labia minora cyst  Post-op diagnosis: same    Kaylee Harris is a 45 y.o. G0P0000 who presents for a right labia minora cyst/fluid collection.  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 Patient presented for an acute visit one week ago because she noticed 2 painful, labial bumps. She has a small one on the left side that spontaneously drained and she has another on the right side that is enlarging and becoming more painful. Patient has tried tylenol and sitz baths without relief. She tried a short case of doxycycline previously prescribed by her Dermatologist for her history of hydradenitis suppurativa. Patient denies systemic symptoms such as fevers or chills. On evaluation one week ago, the right labia minora lesion was firm and indurated; there was no obvious signs of a fluid collection to drain. Patient has continued to try conservative measures throughout the week, with minimal improvement in her symptoms. The lesion is still significantly tender to light palpation. Currently, it feels like there is more of a fluid collection, however, it is not spontaneously draining. Patient presents today for I&D of the lesion. The risks, benefits, and alternatives to the procedure were explained and patient provided written consent.               Procedure:   After verbal/written consent was obtained, the patient was placed in dorsal lithotomy position. An alcohol swab was used to clean and prep the area. 2-51mL of 1% Lidocaine administered as local anesthesia superficially into the skin overlying the cyst.  Once adequate anesthesia confirmed with palpation, 18 g needled was used to incise a 1 mm vertical incision into center of cyst. (superior/medial border) Immediate drainage of 2-3 mL dark red blood was noted. Area cleaned off and procedure completed.    The patient  tolerated the procedure well.    Complications: None    Patient given post-procedural care instructions including twice daily sitz baths, continued expression of cyst contents with clean hands, tylenol for pain. Return and call precautions reviewed. Patient will follow-up in 1-2 months for her annual exam and to reexamine the lesion for resolution. Patient expressed understanding.    Dr. Jobie Quaker was present for the entire procedure    Stephanie Coup, MD  Obstetrics and Gynecology PGY-4  Pager: [phone]

## 2020-06-09 ENCOUNTER — Other Ambulatory Visit: Payer: Self-pay | Admitting: Primary Care

## 2020-06-09 DIAGNOSIS — Z760 Encounter for issue of repeat prescription: Secondary | ICD-10-CM

## 2020-06-12 ENCOUNTER — Other Ambulatory Visit: Payer: Self-pay | Admitting: Family Medicine

## 2020-06-12 ENCOUNTER — Other Ambulatory Visit: Payer: Self-pay | Admitting: Primary Care

## 2020-06-12 DIAGNOSIS — Z760 Encounter for issue of repeat prescription: Secondary | ICD-10-CM

## 2020-06-12 MED ORDER — TRAMADOL HCL 50 MG PO TABS *I*
50.0000 mg | ORAL_TABLET | Freq: Three times a day (TID) | ORAL | 0 refills | Status: DC | PRN
Start: 2020-06-12 — End: 2020-06-14

## 2020-06-12 NOTE — Telephone Encounter (Signed)
Confidential Drug Report  Search Terms: Itha Kroeker, [birth year]   Search Date: 06/12/2020 08:50:11 AM   Searching on behalf of: [id number] - [name]   The Drug Utilization Report below displays all of the controlled substance prescriptions, if any, that your patient has filled in the last twelve months. The information displayed on this report is compiled from pharmacy submissions to the Department, and accurately reflects the information as submitted by the pharmacies.  This report was requested by: Denton Meek   Reference #: [id number]   Others' Prescriptions  Patient Name: Kaylee Harris   Birth Date: [birth year]   Address: [place], [place] [address]   Sex: Female   Rx Written Rx Dispensed Drug Quantity Days Supply Prescriber Name Prescriber Martin # Payment Method Dispenser   05/07/2020 05/09/2020 tramadol hcl 50 mg tablet  42 14 [name], Darnell Level (MD) [place]. #02   03/14/2020 03/16/2020 alprazolam 1 mg tablet  15 35 Reginold Agent [id number] [place]. #19   02/22/2020 02/23/2020 tramadol hcl 50 mg tablet  42 14 Reginold Agent [id number] [place]. #02   01/28/2020 02/02/2020 alprazolam 1 mg tablet  15 15 [name], Darnell Level (MD) [place]. #19   12/15/2019 12/23/2019 tramadol hcl 50 mg tablet  42 14 Graceann Congress [id number] [place]. #02   12/08/2019 12/13/2019 tramadol hcl 50 mg tablet  42 14 Graceann Congress [id number] [place]. #02   11/11/2019 11/18/2019 tramadol hcl 50 mg tablet  42 14 Graceann Congress [id number] [place]. #19   09/22/2019 09/27/2019 tramadol hcl 50 mg tablet  42 14 Graceann Congress [id number] [place]. #19   07/28/2019 08/02/2019 tramadol hcl 50 mg tablet  42 14 Reginold Agent [id number] [place]. #19   07/13/2019 07/13/2019  hydrocodone-acetaminophen 5-325 mg tablet  30 5 Melody Comas DMD [place]. #02   06/24/2019 06/28/2019 tramadol hcl 50 mg tablet  42 14 Reginold Agent [id number] [place]. #19     * - Drugs marked with an asterisk are compound drugs. If the compound drug is made up of more than one controlled substance, then each controlled substance will be a separate row in the table.         Last office visit:   04/25/2020  Patients upcoming appointments:  Future Appointments   Date Time Provider [place]   07/06/2020  2:30 PM Nada Libman, NP [place] None   08/03/2020  2:00 PM [name], MD [facility] None   08/14/2020 10:30 AM Gilford Silvius, MD [facility] None       Recent Lab Values 04/14/2019 01/04/2018 12/17/2017 10/17/2017 10/17/2017 03/31/2017 10/29/2016   EXP DATE 2019-08-04 9/20 02/02/2018 11-03-18 08-04-18 04/04/18 04/04/2017   [facility] - - - - - - -       Recent Lab results:  GENERAL CHEMISTRY   Recent Labs     04/25/20  1637   NA 140   K 4.5   CL 104   CO2 24   GAP 12   UN 10   CREAT 0.97*   GFRC 70   GFRB 81   GLU 79   CA 9.5      LIPID PROFILE   Recent Labs     04/25/20  1637 10/29/19  1444   CHOL 122  124   TRIG 92 91   HDL 38* 38*   LDLC 66 68      LIVER PROFILE   Recent Labs     04/25/20  1637   ALT 25   AST 18   ALK 105   TB 0.4      DIABETES THYROID   Recent Labs     04/25/20  1637 11/01/19  1739 07/28/19  1500   HA1C 6.3* 6.3* 6.4*    Recent Labs     04/25/20  1637   TSH 1.03         Pending/Orders Labs:  Lab Frequency Next Occurrence

## 2020-06-12 NOTE — Telephone Encounter (Signed)
Last office visit:   04/25/2020  Patients upcoming appointments:  Future Appointments   Date Time Provider Department Center   07/06/2020  2:30 PM Bartlett, Stephanie, NP [facility] None   08/03/2020  2:00 PM Moore, Jillian, MD [facility] None   08/14/2020 10:30 AM Paraison, Lauren Elizabeth, MD [facility] / [facility] None       Recent Lab Values 04/14/2019 01/04/2018 12/17/2017 10/17/2017 10/17/2017 03/31/2017 10/29/2016   EXP DATE 2019-08-04 9/20 02/02/2018 11-03-18 08-04-18 04/04/18 04/04/2017   [facility] - - - - - - -       Recent Lab results:  GENERAL CHEMISTRY   Recent Labs     04/25/20  1637   NA 140   K 4.5   CL 104   CO2 24   GAP 12   UN 10   CREAT 0.97*   GFRC 70   GFRB 81   GLU 79   CA 9.5      LIPID PROFILE   Recent Labs     04/25/20  1637 10/29/19  1444   CHOL 122 124   TRIG 92 91   HDL 38* 38*   LDLC 66 68      LIVER PROFILE   Recent Labs     04/25/20  1637   ALT 25   AST 18   ALK 105   TB 0.4      DIABETES THYROID   Recent Labs     04/25/20  1637 11/01/19  1739 07/28/19  1500   HA1C 6.3* 6.3* 6.4*    Recent Labs     04/25/20  1637   TSH 1.03         Pending/Orders Labs:  Lab Frequency Next Occurrence      Patient Name: Kaylee Harris   Birth Date: 12/16/1975   Address: [address] [place], [place] [address]   Sex: Female   Rx Written Rx Dispensed Drug Quantity Days Supply Prescriber Name Prescriber Dea # Payment Method Dispenser  05/07/2020 05/09/2020 tramadol hcl 50 mg tablet  42 14 Moore, Jillian, G (MD) [id number] [employer] #02  03/14/2020 03/16/2020 alprazolam 1 mg tablet  15 35 Costello, Alice [id number] [employer] #19  02/22/2020 02/23/2020 tramadol hcl 50 mg tablet  42 14 Costello, Alice [id number] [employer] #02  01/28/2020 02/02/2020 alprazolam 1 mg tablet  15 15 Moore, Jillian, G (MD) [id number] [employer] #19  12/15/2019 12/23/2019 tramadol hcl 50 mg tablet  42 14 Burke, Brandi [id number] [employer]  #02  12/08/2019 12/13/2019 tramadol hcl 50 mg tablet  42 14 Burke, Brandi [id number] [employer] #02  11/11/2019 11/18/2019 tramadol hcl 50 mg tablet  42 14 Burke, Brandi [id number] [employer] #19  09/22/2019 09/27/2019 tramadol hcl 50 mg tablet  42 14 Burke, Brandi [id number] [employer] #19  07/28/2019 08/02/2019 tramadol hcl 50 mg tablet  42 14 Costello, Alice [id number] [employer] #19  07/13/2019 07/13/2019 hydrocodone-acetaminophen 5-325 mg tablet  30 5 Bracci, Andrew DMD [id number] [employer] #02  06/24/2019 06/28/2019 tramadol hcl 50 mg tablet  42 14 Costello, Alice [id number] [employer] #19

## 2020-06-12 NOTE — Telephone Encounter (Signed)
Last office visit:   04/25/2020  Patients upcoming appointments:  Future Appointments   Date Time Provider [place]   07/06/2020  2:30 PM Nada Libman, NP [facility] None   08/03/2020  2:00 PM [name], MD [facility] None   08/14/2020 10:30 AM Paraison, Cleda Clarks, MD [facility] None       Recent Lab Values 04/14/2019 01/04/2018 12/17/2017 10/17/2017 10/17/2017 03/31/2017 10/29/2016   EXP DATE 2019-08-04 9/20 02/02/2018 11-03-18 08-04-18 04/04/18 04/04/2017   [facility] - - - - - - -       Recent Lab results:  GENERAL CHEMISTRY   Recent Labs     04/25/20  1637   NA 140   K 4.5   CL 104   CO2 24   GAP 12   UN 10   CREAT 0.97*   GFRC 70   GFRB 81   GLU 79   CA 9.5      LIPID PROFILE   Recent Labs     04/25/20  1637 10/29/19  1444   CHOL 122 124   TRIG 92 91   HDL 38* 38*   LDLC 66 68      LIVER PROFILE   Recent Labs     04/25/20  1637   ALT 25   AST 18   ALK 105   TB 0.4      DIABETES THYROID   Recent Labs     04/25/20  1637 11/01/19  1739 07/28/19  1500   HA1C 6.3* 6.3* 6.4*    Recent Labs     04/25/20  1637   TSH 1.03         Pending/Orders Labs:  Lab Frequency Next Occurrence      Patient Name: Kaylee Harris   Birth Date: 05-11-1975   Address: [place], [place] [address]   Sex: Female   Rx Written Rx Dispensed Drug Quantity Days Supply Prescriber Name Prescriber Dea # Payment Method Dispenser  05/07/2020 05/09/2020 tramadol hcl 50 mg tablet  42 14 [name], Darnell Level (MD) [place]. #02  03/14/2020 03/16/2020 alprazolam 1 mg tablet  15 35 Reginold Agent [id number] [place]. #19  02/22/2020 02/23/2020 tramadol hcl 50 mg tablet  42 14 Reginold Agent [id number] [place]. #02  01/28/2020 02/02/2020 alprazolam 1 mg tablet  15 15 [name], Darnell Level (MD) [place]. #19  12/15/2019 12/23/2019 tramadol hcl 50 mg tablet  42 14 Graceann Congress [id number] [place].  #02  12/08/2019 12/13/2019 tramadol hcl 50 mg tablet  42 14 Graceann Congress [id number] [place]. #02  11/11/2019 11/18/2019 tramadol hcl 50 mg tablet  42 14 Graceann Congress [id number] [place]. #19  09/22/2019 09/27/2019 tramadol hcl 50 mg tablet  42 14 Graceann Congress [id number] [place]. #19  07/28/2019 08/02/2019 tramadol hcl 50 mg tablet  42 14 Reginold Agent [id number] [place]. #19  07/13/2019 07/13/2019 hydrocodone-acetaminophen 5-325 mg tablet  30 5 Melody Comas DMD [id number] Insurance [place]. #02  06/24/2019 06/28/2019 tramadol hcl 50 mg tablet  42 14 Reginold Agent [id number] [place]. #19

## 2020-06-14 ENCOUNTER — Other Ambulatory Visit: Payer: Self-pay | Admitting: Primary Care

## 2020-06-14 DIAGNOSIS — Z760 Encounter for issue of repeat prescription: Secondary | ICD-10-CM

## 2020-06-14 NOTE — Telephone Encounter (Signed)
Patient Name: Ravleen Tavano   Birth Date: 08/16/1975   Address: [address] [place], [place] [address]   Sex: Female   Rx Written Rx Dispensed Drug Quantity Days Supply Prescriber Name Prescriber Dea # Payment Method Dispenser   05/07/2020 05/09/2020 tramadol hcl 50 mg tablet  42 14 Moore, Jillian, G (MD) [id number] [employer] #02   03/14/2020 03/16/2020 alprazolam 1 mg tablet  15 35 Costello, Alice [id number] [employer] #19   02/22/2020 02/23/2020 tramadol hcl 50 mg tablet  42 14 Costello, Alice [id number] [employer] #02   01/28/2020 02/02/2020 alprazolam 1 mg tablet  15 15 Moore, Jillian, G (MD) [id number] [employer] #19   12/15/2019 12/23/2019 tramadol hcl 50 mg tablet  42 14 Burke, Brandi [id number] [employer] #02   12/08/2019 12/13/2019 tramadol hcl 50 mg tablet  42 14 Burke, Brandi [id number] [employer] #02   11/11/2019 11/18/2019 tramadol hcl 50 mg tablet  42 14 Burke, Brandi [id number] [employer] #19          Last office visit:   04/25/2020  Patients upcoming appointments:  Future Appointments   Date Time Provider Department Center   07/06/2020  2:30 PM Bartlett, Stephanie, NP [facility] None   08/03/2020  2:00 PM Moore, Jillian, MD [facility] None   08/14/2020 10:30 AM Paraison, Lauren Elizabeth, MD [facility] None       Recent Lab Values 04/14/2019 01/04/2018 12/17/2017 10/17/2017 10/17/2017 03/31/2017 10/29/2016   EXP DATE 2019-08-04 9/20 02/02/2018 11-03-18 08-04-18 04/04/18 04/04/2017   [facility] - - - - - - -       Recent Lab results:  GENERAL CHEMISTRY   Recent Labs     04/25/20  1637   NA 140   K 4.5   CL 104   CO2 24   GAP 12   UN 10   CREAT 0.97*   GFRC 70   GFRB 81   GLU 79   CA 9.5      LIPID PROFILE   Recent Labs     04/25/20  1637 10/29/19  1444   CHOL 122 124   TRIG 92 91   HDL 38* 38*   LDLC 66 68      LIVER PROFILE   Recent Labs     04/25/20  1637   ALT 25    AST 18   ALK 105   TB 0.4      DIABETES THYROID   Recent Labs     04/25/20  1637 11/01/19  1739 07/28/19  1500   HA1C 6.3* 6.3* 6.4*    Recent Labs     04/25/20  1637   TSH 1.03         Pending/Orders Labs:  Lab Frequency Next Occurrence

## 2020-06-15 ENCOUNTER — Telehealth: Payer: Self-pay | Admitting: Primary Care

## 2020-06-15 NOTE — Telephone Encounter (Signed)
Cecille Rubin from [facility] called to inform Dr Laurance Flatten that Kaylee Harris is open to aid services. Cecille Rubin stated that they also seen [name] (wife) who will also be receiving ad services.

## 2020-07-02 ENCOUNTER — Other Ambulatory Visit: Payer: Self-pay | Admitting: Primary Care

## 2020-07-02 DIAGNOSIS — F319 Bipolar disorder, unspecified: Secondary | ICD-10-CM

## 2020-07-02 DIAGNOSIS — F603 Borderline personality disorder: Secondary | ICD-10-CM

## 2020-07-03 ENCOUNTER — Other Ambulatory Visit: Payer: Self-pay

## 2020-07-03 NOTE — Telephone Encounter (Signed)
Last office visit:   04/25/2020  Patients upcoming appointments:  Future Appointments   Date Time Provider [place]   07/06/2020  2:30 PM Nada Libman, NP [place] None   08/03/2020  2:00 PM [name], MD [facility] None   08/14/2020 10:30 AM Paraison, Cleda Clarks, MD [facility] None       Recent Lab Values 04/14/2019 01/04/2018 12/17/2017 10/17/2017 10/17/2017 03/31/2017 10/29/2016   EXP DATE 2019-08-04 9/20 02/02/2018 11-03-18 08-04-18 04/04/18 04/04/2017   [facility] - - - - - - -       Recent Lab results:  GENERAL CHEMISTRY   Recent Labs     04/25/20  1637   NA 140   K 4.5   CL 104   CO2 24   GAP 12   UN 10   CREAT 0.97*   GFRC 70   GFRB 81   GLU 79   CA 9.5      LIPID PROFILE   Recent Labs     04/25/20  1637 10/29/19  1444   CHOL 122 124   TRIG 92 91   HDL 38* 38*   LDLC 66 68      LIVER PROFILE   Recent Labs     04/25/20  1637   ALT 25   AST 18   ALK 105   TB 0.4      DIABETES THYROID   Recent Labs     04/25/20  1637 11/01/19  1739 07/28/19  1500   HA1C 6.3* 6.3* 6.4*    Recent Labs     04/25/20  1637   TSH 1.03         Pending/Orders Labs:  Lab Frequency Next Occurrence

## 2020-07-03 NOTE — Progress Notes (Signed)
[facility] Management  Monthly Summary Note    Contact Achieved with Member This Month: 2/15  Method of Contact: phone   Care Manager Interventions: CM asked client if she had contacted [facility] improvement program yet, client has not. CM encouraged client to reach out for assessment as it is a great recourse for her home improvement needs.   CM then completed an intake form for [employer] program that sometimes assists with hot water tank needs. CM completed this form online on [employer] cite and included CM and client contact information. CM gathered income information from client and will follow up with client once as [place] program responds to intake form

## 2020-07-06 ENCOUNTER — Ambulatory Visit: Payer: Medicare (Managed Care) | Admitting: Psychiatry

## 2020-07-06 ENCOUNTER — Encounter: Payer: Self-pay | Admitting: Psychiatry

## 2020-07-06 VITALS — Ht 67.0 in | Wt 234.0 lb

## 2020-07-06 DIAGNOSIS — E1165 Type 2 diabetes mellitus with hyperglycemia: Secondary | ICD-10-CM

## 2020-07-06 NOTE — Progress Notes (Signed)
Endocrinology, Diabetes, and Metabolism [facility]UV for Diabetes.    Video Visit     Location of Patient: home    Location of Telemedicine Provider: hospital / clinical location    Other participants in telemedicine encounter and roles:  no    This is an established patient visit.    Reason for visit: Follow-up        HPI:    This is a 45 y.o. female with a history of Type 2 Diabetes for almost 10 years.  Known complications include none.    Since her last visit, she has had significant stress and loss. Her depression has been exacerbated since COVID. She has significant whole body pain due to her fibromyalgia.    Over 2.5 years she has lost 30 lbs.    Denies any CP or SOB, no polyuria, no polydipsia. Occasional double vision when she feels a migraine is coming on, she takes topamax for her Migraines which seem helpful. No blurry vision. No severe hypoglycemia requiring assistance or EMS. No paresthesias in hands or feet. She does have a slight numbness on left thigh ever since she had R shoulder surgery ( was told it could have been since she was placed on her left side during the procedure).  No open wounds or sores on feet and performs daily self checks.Has ongoing diabetes health maintenance activities done including routine eye exams, podiatry and dental.    Current diabetes regimen:   Trulicity 5.90BP weekly   Humalog in insulin pump    Humalog in pump  OMNIPOD DASH-Dexcom G6  Basal Rates:  MN 0.8 units/hr  3AM 1.45 units/hr  6AM 1.2 units/hr  Noon  1.0 units/hr  4PM 1.2 units/hr  10pm 1.1 Units/hr    ICR MN 13  ISF 40  Target 140  Active Insulin 4h  Glucose Sensor - G6 Dexcom  She is trying to eat smaller portions more often and when she boluses she purposely puts less carbs in pump to avoid having to treat a low, POTS makes her feel tired and bloated.    She lacks motivation right now and feels fatigued often    Blood sugar logs and/or meter were Reviewed on Glooko/Dexcom  Past 30 days  Lowest BG in the 40s -  suspect she may have overbolused for carbs    Fasting 70s-100s  Lunch 100s-130s   Dinner  70s-190s   Bedtime 140s-200s  OVN 40s-200s    Checking 4-6x daily.     Exercise and diet habits:  Diet:healthy choices, small meals several times per day  Exercise walking daily    Last dilated eye exam: routine  Foot care:  Podiatry N/A; self foot check Yes    Last dental appointment: routine          Allergies:   Allergies   Allergen Reactions    Morphine Itching    Trazodone Anaphylaxis    Seasonal Allergies Itching    Lidocaine Rash     Patches caused a rash    Nsaids Other (See Comments)     Bleeding/epistaxis    Other [Other] Other (See Comments)     Derma Bond(skin glue) pruritis/redness and c/o respiratory distress.   Received name: Other       Current Outpatient Medications on File Prior to Visit   Medication Sig Dispense Refill    DULoxetine (CYMBALTA) 60 mg DR capsule TAKE 1 CAPSULE BY MOUTH EVERY DAY 90 capsule 3    traMADol (ULTRAM) 50 mg tablet TAKE  1 TABLET BY MOUTH EVERY 8 HOURS AS NEEDED FOR HEAD AND ABDOMINAL PAIN, MAXIMUM DAILY DOSE OF 3 TABLETS PER DAY 42 tablet 0    ALPRAZolam (XANAX) 1 mg tablet TAKE 1 TABLET BY MOUTH THREE TIMES WEEKLY AS NEEDED FOR ANXIETY MAXIMUM DAILY DOSE OF 1 PER DAY 15 tablet 0    cetirizine (ZYRTEC) 10 mg tablet Take 1 tablet (10 mg total) by mouth 2 times daily 180 tablet 1    dicyclomine (BENTYL) 10 mg capsule Take 1 capsule (10 mg total) by mouth 4 times daily (before meals and nightly)  TAKE 1 CAPSULE BY MOUTH FOUR TIMES DAILY BEFORE MEALS AND NIGHTLY 360 capsule 1    topiramate (TOPAMAX) 100 mg tablet Take 1 tablet (100 mg total) by mouth 2 times daily 180 tablet 3    blood glucose test strip Test  8 times a day.   Brand name of strips Contour NEXT test strips for linked omnipod pump 250 each 11    QUEtiapine (SEROQUEL XR) 50 mg 24 hr tablet Take 1 tablet (50 mg total) by mouth nightly  Swallow whole. Do not chew, crush, or break. 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 1 each 2    famotidine (PEPCID) 20 MG tablet TAKE 1 TABLET BY MOUTH EVERY EVENING AS NEEDED FOR HEARTBURN 90 tablet 1    DULoxetine (CYMBALTA) 30 MG DR capsule Take 1 capsule (30 mg total) by mouth daily  Take with 39m dose to total 922mdaily 90 capsule 3    lamoTRIgine (LAMICTAL) 200 MG tablet Take 1 tablet (200 mg total) by mouth daily Replacing 15016mose 90 tablet 1    Insulin Disposable Pump (OMNIPOD DASH 5 PACK PODS) MISC By 1 device no specified route as needed Change every 2 days = 45 pods for 90 days supply = 9 (5packs) 45 each 3    SUMAtriptan (IMITREX) 50 MG tablet TAKE 1 TABLET BY MOUTH AS NEEDED FOR MIGRAINE, TAKE AT ONSET OF HEADACHE, MAY REPEAT ONCE IN 2 HOURS 27 tablet 1    promethazine (PHENERGAN) 25 MG tablet Take 1 tablet (25 mg total) by mouth every 4-6 hours as needed 30 tablet 3    triamcinolone (KENALOG) 0.5 % cream Apply topically 2 times daily to the following areas: arms 45 g 1    dulaglutide (TRULICITY) 0.73.32/RJ/1.8ACn Inject 0.5 mLs (0.75 mg total) into the skin every 7 days 2 mL 5    Alcohol Swabs (SM ALCOHOL PREP) 70 % PADS USE 1 PAD TOPICALLY TWO TIMES DAILY TO CHECK BLOOD GLUCOSE 100 each 3    clindamycin (CLEOCIN T) 1 % external solution APPLY TOPICALLY TWO TIMES DAILY, CONTINUE UNTIL  IMPROVEMENT IN CYSTS 60 mL 1    ascorbic acid (VITAMIN C) 100 MG tablet Take 5 tablets (500 mg total) by mouth daily 100 tablet 3    Continuous Blood Gluc Transmit (DEXCOM G6 TRANSMITTER) Attach to sensor. Change every 90 days 1 each 3    Continuous Blood Gluc Sensor (DEXCOM G6 SENSOR) Inject into the skin as needed (change every 10 days) 9 each 3    fluticasone (FLONASE) 50 MCG/ACT nasal spray Spray 1 spray into nostril daily 48 g 3    Continuous Blood Gluc Receiver (DEXCOM G6 RECEIVER) By no specified route as needed 9 each 3    pantoprazole (PROTONIX) 40 MG EC tablet Take 1 tablet (40 mg total) by mouth daily Swallow whole. Do not crush, break, or chew. 90 tablet 3    benzoyl peroxide 10 % LIQD external liquid Apply topically 2 times daily to the following areas: under arms, under breast, groin 227 g 3    LIDOCAINE HCL URETHRAL/MUCOSAL 2 % jelly APPLY TOPICALLY THREE TIMES DAILY AS NEEDED FOR PAIN 30 mL 4    insulin lispro 100 UNIT/ML injection vial Inject as directed via pump up to Maximum  60  Units/day 60 mL 3    busPIRone (BUSPAR) 30 MG tablet Take 1 tablet (30 mg total) by mouth 2 times daily 180 tablet 3    insulin syringe-needle U-100 (BD INSULIN SYRINGE U/F) 31G X 5/16" 0.3 ML HALF-UNIT USE THREE TIMES DAILY AS INSTRUCTED 100 each 10    diphenoxylate-atropine (LOMOTIL) 2.5-0.025 MG per tablet Take 1-2 tablets by mouth 4 times daily as needed for Diarrhea Max daily dose: 8 tablets Code D 240 tablet 0    blood pressure monitor, automatic with arm cuff Use as directed to monitor blood pressure 1 each 0    incontinence supply disposable Order Description:  Wear as needed for stool leakage 100 each 3    zinc oxide 20 % ointment Apply topically as needed for Dry Skin 30 g 3    Continuous Blood Gluc Receiver (DEXCOM G6 RECEIVER) DEVI By 1 Device no specified route as needed 1 Device 0    glucagon (BAQSIMI) 3 MG/DOSE nasal powder Inhale one dose (3 mg) into one nostril once as needed for low  blood sugar. If no response in 15 min, inhale second dose from new device 2 each 5    lancets (BAYER MICROLET) Use   8  times per day as instructed for blood glucose testing. 250 each 11    azelastine (OPTIVAR) 0.05 % ophthalmic solution Place 1 drop into both eyes 2 times daily      SUMAtriptan refill (IMITREX STATDOSE) 6 MG/0.5ML injection INJECT 0.5ML UNDER THE SKIN ONCE AS NEEDED FOR MIGRAINE. MAY REPEAT DOSE ONCE AFTER 1 HOUR 2 Syringe 5    glucagon (GLUCAGEN) 1 MG injection kit Inject 1 mg into the skin once as needed for Low blood sugar   Discard any unused portion. 1 each 5    atorvastatin (LIPITOR) 40 MG tablet Take 1 tablet (40 mg total) by mouth daily (with dinner) 90 tablet 3    metoprolol (TOPROL-XL) 25 MG 24 hr tablet Take by mouth daily         midodrine (PROAMATINE) 10 MG tablet Take 1 tablet (10 mg total) by mouth 3 times daily 90 tablet 0    Non-System Medication The above patient is followed in our clinic and cannot resume work permanently. 1 each 1     No current facility-administered  medications on file prior to visit.     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 no  -Dawn phenomenon:  yes  -HbA1c greater than 7% or 1% over upper range of normal: no  -History of suboptimal glycemic control before or during pregnancy: na  -LAST 30 DAYS of BG LOGS (in notes below) or ranges from: Glooko/G6 report 40s-200s    Patient's problem list, allergies, and medications were reviewed and updated as  appropriate.  Please see the EHR for full details.    Physical Examination:   There were no vitals filed for this visit.  There is no height or weight on file to calculate BMI.  Wt Readings from Last 3 Encounters:   06/06/20 106.1 kg (233 lb 12.8 oz)   05/30/20 107.1 kg (236 lb 3.2 oz)   04/25/20 107.3 kg (236 lb 9.6 oz)       CONSTITUTIONAL:  well developed, well nourished, no acute distress  HEENT:  no lid lank, no proptosis.   NECK on inspection, no thyromegaly,   NEUROLOGICAL: alert and oriented x 3 no tremor.  PSYCH:mood appropriate    Labs and Imaging:    I personally reviewed and confirmed all laboratory and radiology testing listed below:      Lab results: 04/25/20  1637   Sodium 140   Potassium 4.5   Chloride 104   CO2 24   UN 10   Creatinine 0.97*   GFR,Caucasian 70   GFR,Black 81   Glucose 79   Calcium 9.5         Lab Results   Component Value Date    HA1C 6.3 (H) 04/25/2020       Lab Results   Component Value Date    ALT 25 04/25/2020    AST 18 04/25/2020     Lab Results   Component Value Date    CHOL 122 04/25/2020    HDL 38 (L) 04/25/2020    LDLC 66 04/25/2020    TRIG 92 04/25/2020    [place]inson 3.2 04/25/2020     Lab Results   Component Value Date    TSH 1.03 04/25/2020       Lab Results   Component Value Date    WBC 12.2 (H) 04/25/2020    HGB 13.2 04/25/2020    HCT 42 04/25/2020    MCV 91 04/25/2020    PLT 347 04/25/2020       Assessment: This is a 45 y.o. female with Type 2 Diabetes Uncontrolled with hyperglycemia.  Goal A1c is <7%. Overall she is doing well with occasional glycemic excursions and minimal hypoglycemia after overbolusing. Depression worsening    Plan:   Pump changes: None at this time. Continue on low dose Trulicity  BP - on betablocker and diuretic  On statin therapy.  Recommend annual dilated eye exams and biannual dental exams for routine health maintenance.  ANNUAL  labs before next appointment  Continue mental health follow up for ongoing evaluation and management of her  depression    Nada Libman, NP  Nada Libman, NP  [facility] Department of Endocrinology  [address], [place], [place] [address]  Phone [phone]  Fax [phone]      Consent was obtained from the patient to complete this video visit; including the potential for financial liability.          Nada Libman, NP

## 2020-07-09 ENCOUNTER — Other Ambulatory Visit: Payer: Self-pay | Admitting: Primary Care

## 2020-07-09 ENCOUNTER — Other Ambulatory Visit: Payer: Self-pay | Admitting: Family Medicine

## 2020-07-09 DIAGNOSIS — F419 Anxiety disorder, unspecified: Secondary | ICD-10-CM

## 2020-07-09 DIAGNOSIS — Z760 Encounter for issue of repeat prescription: Secondary | ICD-10-CM

## 2020-07-10 NOTE — Telephone Encounter (Signed)
Last office visit:   04/25/2020  Patients upcoming appointments:  Future Appointments   Date Time Provider [place]   08/03/2020  2:00 PM [name], MD CAF None   08/14/2020 10:30 AM Paraison, Cleda Clarks, MD COM / COB None       Recent Lab Values 04/14/2019 01/04/2018 12/17/2017 10/17/2017 10/17/2017 03/31/2017 10/29/2016   EXP DATE 2019-08-04 9/20 02/02/2018 11-03-18 08-04-18 04/04/18 04/04/2017   THCU - - - - - - -       Recent Lab results:  GENERAL CHEMISTRY   Recent Labs     04/25/20  1637   NA 140   K 4.5   CL 104   CO2 24   GAP 12   UN 10   CREAT 0.97*   GFRC 70   GFRB 81   GLU 79   CA 9.5      LIPID PROFILE   Recent Labs     04/25/20  1637 10/29/19  1444   CHOL 122 124   TRIG 92 91   HDL 38* 38*   LDLC 66 68      LIVER PROFILE   Recent Labs     04/25/20  1637   ALT 25   AST 18   ALK 105   TB 0.4      DIABETES THYROID   Recent Labs     04/25/20  1637 11/01/19  1739 07/28/19  1500   HA1C 6.3* 6.3* 6.4*    Recent Labs     04/25/20  1637   TSH 1.03         Pending/Orders Labs:  Lab Frequency Next Occurrence   Hemoglobin A1c Once 07/06/2020

## 2020-07-10 NOTE — Telephone Encounter (Signed)
Patient Name: Kaylee Harris   Birth Date: Nov 23, 1975   Address: [place], [place] [address]   Sex: Female   Rx Written Rx Dispensed Drug Quantity Days Supply Prescriber Name Prescriber [place] # Payment Method Dispenser   06/12/2020 06/15/2020 alprazolam 1 mg tablet  15 35 Reginold Agent [id number] [place]. #19   06/15/2020 06/15/2020 tramadol hcl 50 mg tablet  42 14 [name], Darnell Level (MD) [place]. #02   05/07/2020 05/09/2020 tramadol hcl 50 mg tablet  42 14 [name], Darnell Level (MD) [place]. #02   03/14/2020 03/16/2020 alprazolam 1 mg tablet  15 35 Reginold Agent [id number] [place]. #19   02/22/2020 02/23/2020 tramadol hcl 50 mg tablet  42 14 Reginold Agent [id number] [place]. #02   01/28/2020 02/02/2020 alprazolam 1 mg tablet  15 15 [name], Darnell Level (MD) [place]. #19   12/15/2019 12/23/2019 tramadol hcl 50 mg tablet  42 14 Burke, [place]. #02   12/08/2019 12/13/2019 tramadol hcl 50 mg tablet  42 14 Graceann Congress [id number] [place]. #02   11/11/2019 11/18/2019 tramadol hcl 50 mg tablet  42 14 Graceann Congress [id number] [place]. #19   09/22/2019 09/27/2019 tramadol hcl 50 mg tablet  42 14 Burke, [place]. #19   07/28/2019 08/02/2019 tramadol hcl 50 mg tablet  42 14 Reginold Agent [id number] [place]. #19   07/13/2019 07/13/2019 hydrocodone-acetaminophen 5-325 mg tablet  30 5 Melody Comas DMD [place]. #02          Last office visit:   04/25/2020  Patients upcoming appointments:  Future Appointments   Date Time Provider [place]   08/03/2020  2:00 PM [name], MD CAF None   08/14/2020 10:30 AM Paraison,  Cleda Clarks, MD COM / COB None       Recent Lab Values 04/14/2019 01/04/2018 12/17/2017 10/17/2017 10/17/2017 03/31/2017 10/29/2016   EXP DATE 2019-08-04 9/20 02/02/2018 11-03-18 08-04-18 04/04/18 04/04/2017   THCU - - - - - - -       Recent Lab results:  GENERAL CHEMISTRY   Recent Labs     04/25/20  1637   NA 140   K 4.5   CL 104   CO2 24   GAP 12   UN 10   CREAT 0.97*   GFRC 70   GFRB 81   GLU 79   CA 9.5      LIPID PROFILE   Recent Labs     04/25/20  1637 10/29/19  1444   CHOL 122 124   TRIG 92 91   HDL 38* 38*   LDLC 66 68      LIVER PROFILE   Recent Labs     04/25/20  1637   ALT 25   AST 18   ALK 105   TB 0.4      DIABETES THYROID   Recent Labs     04/25/20  1637 11/01/19  1739 07/28/19  1500   HA1C 6.3* 6.3* 6.4*    Recent Labs     04/25/20  1637   TSH 1.03         Pending/Orders Labs:  Lab Frequency Next Occurrence   Hemoglobin A1c Once 07/06/2020

## 2020-07-16 ENCOUNTER — Other Ambulatory Visit: Payer: Self-pay | Admitting: Psychiatry

## 2020-07-16 ENCOUNTER — Other Ambulatory Visit: Payer: Self-pay | Admitting: Primary Care

## 2020-07-17 NOTE — Telephone Encounter (Signed)
Last office visit:   04/25/2020  Patients upcoming appointments:  Future Appointments   Date Time Provider [place]   08/03/2020  2:00 PM [name], MD CAF None   08/14/2020 10:30 AM Paraison, Cleda Clarks, MD COM / COB None       Recent Lab Values 04/14/2019 01/04/2018 12/17/2017 10/17/2017 10/17/2017 03/31/2017 10/29/2016   EXP DATE 2019-08-04 9/20 02/02/2018 11-03-18 08-04-18 04/04/18 04/04/2017   THCU - - - - - - -       Recent Lab results:  GENERAL CHEMISTRY   Recent Labs     04/25/20  1637   NA 140   K 4.5   CL 104   CO2 24   GAP 12   UN 10   CREAT 0.97*   GFRC 70   GFRB 81   GLU 79   CA 9.5      LIPID PROFILE   Recent Labs     04/25/20  1637 10/29/19  1444   CHOL 122 124   TRIG 92 91   HDL 38* 38*   LDLC 66 68      LIVER PROFILE   Recent Labs     04/25/20  1637   ALT 25   AST 18   ALK 105   TB 0.4      DIABETES THYROID   Recent Labs     04/25/20  1637 11/01/19  1739 07/28/19  1500   HA1C 6.3* 6.3* 6.4*    Recent Labs     04/25/20  1637   TSH 1.03         Pending/Orders Labs:  Lab Frequency Next Occurrence   Hemoglobin A1c Once 07/06/2020

## 2020-07-18 ENCOUNTER — Other Ambulatory Visit: Payer: Self-pay | Admitting: Psychiatry

## 2020-07-18 MED ORDER — INSULIN LISPRO (HUMAN) 100 UNIT/ML IJ/SC SOLN *WRAPPED*
SUBCUTANEOUS | 3 refills | Status: DC
Start: 2020-07-18 — End: 2021-03-19

## 2020-07-18 NOTE — Telephone Encounter (Signed)
Pt refill. Please review and adjust accordingly. Thanks

## 2020-07-21 ENCOUNTER — Other Ambulatory Visit: Payer: Self-pay | Admitting: Psychiatry

## 2020-07-24 ENCOUNTER — Other Ambulatory Visit: Payer: Self-pay

## 2020-07-24 NOTE — Telephone Encounter (Signed)
Last office visit:   04/25/2020  Patients upcoming appointments:  Future Appointments   Date Time Provider Department Center   08/03/2020  2:00 PM Moore, Jillian, MD CAF None   08/14/2020 10:30 AM Paraison, Lauren Elizabeth, MD COM / COB None       Recent Lab Values 04/14/2019 01/04/2018 12/17/2017 10/17/2017 10/17/2017 03/31/2017 10/29/2016   EXP DATE 2019-08-04 9/20 02/02/2018 11-03-18 08-04-18 04/04/18 04/04/2017   THCU - - - - - - -       Recent Lab results:  GENERAL CHEMISTRY   Recent Labs     04/25/20  1637   NA 140   K 4.5   CL 104   CO2 24   GAP 12   UN 10   CREAT 0.97*   GFRC 70   GFRB 81   GLU 79   CA 9.5      LIPID PROFILE   Recent Labs     04/25/20  1637 10/29/19  1444   CHOL 122 124   TRIG 92 91   HDL 38* 38*   LDLC 66 68      LIVER PROFILE   Recent Labs     04/25/20  1637   ALT 25   AST 18   ALK 105   TB 0.4      DIABETES THYROID   Recent Labs     04/25/20  1637 11/01/19  1739 07/28/19  1500   HA1C 6.3* 6.3* 6.4*    Recent Labs     04/25/20  1637   TSH 1.03         Pending/Orders Labs:  Lab Frequency Next Occurrence   Hemoglobin A1c Once 07/06/2020

## 2020-07-25 MED ORDER — SUMATRIPTAN SUCCINATE 50 MG PO TABS *I*
ORAL_TABLET | ORAL | 1 refills | Status: DC
Start: 2020-07-25 — End: 2021-03-22

## 2020-07-26 ENCOUNTER — Other Ambulatory Visit: Payer: Self-pay | Admitting: Psychiatry

## 2020-07-26 NOTE — Telephone Encounter (Signed)
Pt refill. Insurance requesting HumaLOG that do not require PA. Please review and adjust accordingly. Thanks

## 2020-07-27 ENCOUNTER — Encounter: Payer: Self-pay | Admitting: Primary Care

## 2020-07-27 ENCOUNTER — Telehealth: Payer: Self-pay | Admitting: Psychiatry

## 2020-07-27 MED ORDER — INSULIN LISPRO (HUMAN) 100 UNIT/ML IJ/SC SOLN *WRAPPED*
SUBCUTANEOUS | 3 refills | Status: DC
Start: 2020-07-27 — End: 2021-04-27

## 2020-07-27 NOTE — Telephone Encounter (Signed)
Approved today insulin Lispro 100U/ML  CaseId:[id number] Type:Prior Auth;Coverage Start Date:06/27/2020;Coverage End Date:07/27/2021;

## 2020-07-28 ENCOUNTER — Other Ambulatory Visit: Payer: Self-pay | Admitting: Family Medicine

## 2020-07-28 DIAGNOSIS — Z9109 Other allergy status, other than to drugs and biological substances: Secondary | ICD-10-CM

## 2020-07-28 MED ORDER — TRIAMCINOLONE ACETONIDE 0.025 % EX OINT *I*
TOPICAL_OINTMENT | Freq: Two times a day (BID) | CUTANEOUS | 0 refills | Status: DC
Start: 2020-07-28 — End: 2021-03-22

## 2020-07-29 ENCOUNTER — Other Ambulatory Visit: Payer: Self-pay | Admitting: Family Medicine

## 2020-07-29 DIAGNOSIS — E1165 Type 2 diabetes mellitus with hyperglycemia: Secondary | ICD-10-CM

## 2020-07-30 ENCOUNTER — Encounter: Payer: Self-pay | Admitting: Primary Care

## 2020-07-31 ENCOUNTER — Other Ambulatory Visit: Payer: Self-pay | Admitting: Primary Care

## 2020-07-31 ENCOUNTER — Ambulatory Visit: Payer: Medicare (Managed Care) | Attending: Primary Care | Admitting: Primary Care

## 2020-07-31 ENCOUNTER — Encounter: Payer: Self-pay | Admitting: Primary Care

## 2020-07-31 VITALS — HR 85 | Temp 97.6°F

## 2020-07-31 DIAGNOSIS — R059 Cough, unspecified: Secondary | ICD-10-CM | POA: Insufficient documentation

## 2020-07-31 DIAGNOSIS — Z20828 Contact with and (suspected) exposure to other viral communicable diseases: Secondary | ICD-10-CM | POA: Insufficient documentation

## 2020-07-31 DIAGNOSIS — Z20822 Contact with and (suspected) exposure to covid-19: Secondary | ICD-10-CM | POA: Insufficient documentation

## 2020-07-31 DIAGNOSIS — Z760 Encounter for issue of repeat prescription: Secondary | ICD-10-CM

## 2020-07-31 MED ORDER — ACETAMINOPHEN-CODEINE 120-12 MG/5ML PO SOLN *I*
5.0000 mL | Freq: Four times a day (QID) | ORAL | 0 refills | Status: DC | PRN
Start: 2020-07-31 — End: 2021-01-22

## 2020-07-31 NOTE — Progress Notes (Signed)
[facility]    SUBJECTIVE    Pt is here to discuss:    Chief Complaint   Patient presents with    Cough         1. Cough- x7 days now, started as nasal congestion and cough, now worsening and accompanied with SOB, wheezing, coughing paroxysms. Last night symptoms were severe, not relieved with SABA and during a coughing bout led to O2 of 90%. Feels chest congestion wthout ability to cough anything up/out. +body aches, h/a, minimal ST    Tmax 99 while on tylenol.          PMH / Family Hx / Social Hx  Patient's medications, allergies, problem list, past medical, social histories were reviewed and notable for:    Current Outpatient Medications   Medication Sig Note    Alcohol Swabs (B-D SINGLE USE SWABS REGULAR) PADS USE 1 PAD TOPICALLY TWO TIMES DAILY TO CHECK BLOOD GLUCOSE     acetaminophen-codeine 120-12 MG/5ML solution Take 5 mLs by mouth every 6 hours as needed for Pain  Max daily dose: 20 mLs     cetirizine (ZYRTEC) 10 mg tablet TAKE 1 TABLET BY MOUTH TWO TIMES DAILY     triamcinolone (KENALOG) 0.025 % ointment Apply topically 2 times daily  to the following areas: L underarm     insulin lispro 100 UNIT/ML injection vial Inject as directed via pump up to Maximum  60  Units/day. Discard vial 28 days after first use.     SUMAtriptan (IMITREX) 50 mg tablet Take at onset of headache. May repeat once in 2 hours. TAKE 1 TABLET BY MOUTH AS NEEDED FOR MIGRAINE, TAKE AT ONSET OF HEADACHE, MAY REPEAT ONCE IN 2 HOURS     TRULICITY 5.62 ZH/0.8MV pen INJECT 0.75 MG SUBCUTANEOUSLY (UNDER THE SKIN) EVERY 7 DAYS     topiramate (TOPAMAX) 100 mg tablet TAKE 1 TABLET BY MOUTH TWO TIMES DAILY     insulin lispro 100 UNIT/ML injection vial Inject as directed via pump up to Maximum  60  Units/day     busPIRone (BUSPAR) 30 mg tablet TAKE 1 TABLET BY MOUTH TWO TIMES DAILY     traMADol (ULTRAM) 50 mg tablet TAKE 1 TABLET BY MOUTH EVERY 8 HOURS AS NEEDED FOR HEAD AND ABDOMINAL PAIN, MAXIMUM DAILY DOSE OF 3 TABLETS  PER DAY     DULoxetine (CYMBALTA) 60 mg DR capsule TAKE 1 CAPSULE BY MOUTH EVERY DAY     ALPRAZolam (XANAX) 1 mg tablet TAKE 1 TABLET BY MOUTH THREE TIMES WEEKLY AS NEEDED FOR ANXIETY MAXIMUM DAILY DOSE OF 1 PER DAY     dicyclomine (BENTYL) 10 mg capsule Take 1 capsule (10 mg total) by mouth 4 times daily (before meals and nightly)  TAKE 1 CAPSULE BY MOUTH FOUR TIMES DAILY BEFORE MEALS AND NIGHTLY     blood glucose test strip Test  8 times a day.   Brand name of strips Contour NEXT test strips for linked omnipod pump     QUEtiapine (SEROQUEL XR) 50 mg 24 hr tablet Take 1 tablet (50 mg total) by mouth nightly  Swallow whole. Do not chew, crush, or break.     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     famotidine (PEPCID) 20 MG tablet TAKE 1 TABLET BY MOUTH EVERY EVENING AS NEEDED FOR HEARTBURN     lamoTRIgine (LAMICTAL) 200 MG tablet Take 1 tablet (200 mg total) by mouth daily Replacing 173m dose     Insulin Disposable Pump (OMNIPOD DASH 5 PACK PODS) MISC By 1 device no specified route as needed Change every 2 days = 45 pods for 90 days supply = 9 (5packs)     promethazine (PHENERGAN) 25 MG tablet Take 1 tablet (25 mg total) by mouth every 4-6 hours as needed     triamcinolone (KENALOG) 0.5 % cream Apply topically 2 times daily to the following areas: arms     clindamycin (CLEOCIN T)  1 % external solution APPLY TOPICALLY TWO TIMES DAILY, CONTINUE UNTIL IMPROVEMENT IN CYSTS     ascorbic acid (VITAMIN C) 100 MG tablet Take 5 tablets (500 mg total) by mouth daily     Continuous Blood Gluc Transmit (DEXCOM G6 TRANSMITTER) Attach to sensor. Change every 90 days     Continuous Blood Gluc Sensor (DEXCOM G6 SENSOR) Inject into the skin as needed (change every 10 days)     fluticasone (FLONASE) 50 MCG/ACT nasal spray Spray 1 spray into nostril daily     Continuous Blood Gluc Receiver (DEXCOM G6 RECEIVER) By no specified route as needed     pantoprazole (PROTONIX) 40 MG EC tablet Take 1 tablet (40 mg total) by mouth daily Swallow whole. Do not crush, break, or chew.     benzoyl peroxide 10 % LIQD external liquid Apply topically 2 times daily to the following areas: under arms, under breast, groin     LIDOCAINE HCL URETHRAL/MUCOSAL 2 % jelly APPLY TOPICALLY THREE TIMES DAILY AS NEEDED FOR PAIN     insulin syringe-needle U-100 (BD INSULIN SYRINGE U/F) 31G X 5/16" 0.3 ML HALF-UNIT USE THREE TIMES DAILY AS INSTRUCTED     diphenoxylate-atropine (LOMOTIL) 2.5-0.025 MG per tablet Take 1-2 tablets by mouth 4 times daily as needed for Diarrhea Max daily dose: 8 tablets Code D     blood pressure monitor, automatic with arm cuff Use as directed to monitor blood pressure     incontinence supply disposable Order Description:  Wear as needed for stool leakage     zinc oxide 20 % ointment Apply topically as needed for Dry Skin     Continuous Blood Gluc Receiver (DEXCOM G6 RECEIVER) DEVI By 1 Device no specified route as needed     glucagon (BAQSIMI) 3 MG/DOSE nasal powder Inhale one dose (3 mg) into one nostril once as needed for low blood sugar. If no response in 15 min, inhale second dose from new device     lancets (BAYER MICROLET) Use   8  times per day as instructed for blood glucose testing.     azelastine (OPTIVAR) 0.05 % ophthalmic solution Place 1 drop into both eyes 2 times daily      SUMAtriptan refill (IMITREX STATDOSE) 6 MG/0.5ML injection INJECT 0.5ML UNDER THE SKIN ONCE AS NEEDED FOR MIGRAINE.  MAY REPEAT DOSE ONCE AFTER 1 HOUR     glucagon (GLUCAGEN) 1 MG injection kit Inject 1 mg into the skin once as needed for Low blood sugar   Discard any unused portion.     atorvastatin (LIPITOR) 40 MG tablet Take 1 tablet (40 mg total) by mouth daily (with dinner)     metoprolol (TOPROL-XL) 25 MG 24 hr tablet Take by mouth daily    06/10/2016: Received from: External Pharmacy    midodrine (PROAMATINE) 10 MG tablet Take 1 tablet (10 mg total) by mouth 3 times daily     Non-System Medication The above patient is followed in our clinic and cannot resume work permanently.        Immunization History   Administered Date(s) Administered    Covid-19 mRNA vaccine (PFIZER) IM 30 mcg/0.52m 07/09/2019, 07/30/2019, 04/25/2020    H1N1 03/14/2008    Influenza Adult(320yrnd up) 01/18/2013    Influenza Inj Quad Historical(aka FLU,unspecified) 03/09/2019    Influenza PF(3 year and up) 02/20/2007    Influenza Quadrivalent 0.103m19mrefilled syringe/single dose vial(FluLaval,Fluzone,Afluria,Fluarix) 05/11/2015, 05/13/2016, 02/23/2017, 03/02/2018, 04/25/2020    Influenza Split ([place]1/01/2008, 03/03/2009, 03/13/2010    Influenza multi-dose vial 01/27/2014    MMR 11/18/1979, 06/11/1990    Pneumococcal Polysaccharide (Pneumovax) 03/13/2010    Tdap 05/31/2013, 05/26/2015       No recent exposure to COVID or other illnesses          OBJECTIVE  Vitals:    07/31/20 1457   Pulse: 85   Temp: 36.4 C (97.6 F)   SpO2: 97%     There is no height or weight on file to calculate BMI.      General: well-appearing Caucasian female, pleasant & conversant, in NAD. +coughing paroxysm triggered by deep inspiration.  Eyes:. PERRLA. EOMI. Conjunctiva pink, without swelling or exudate. Lids normal.   ENT: Moist mucous membranes. No tonsillar, palatal or buccal lesions/abnormalities. Normal lips and dentition. B/L TMs grey, without  bulging or erythema.   Neck: Trachea midline. Thyroid nontender, without nodularity or enlargement.   Lymph: No cervical or supraclavicular lymphadenopathy.  Lungs: Normal resp effort. CTAB.  No crackles or wheezes.  Cardiac: RRR, no M/R/G.  .  Marland Kitchen  Psych: AAOx3, normal affect and mood. Insight and judgement intact.           ASSESSMENT & PLAN  1. Cough  - Influenza A PCR; Future  - Influenza B PCR; Future  - RSV PCR; Future  - COVID-19 PCR; Future    Recommended testing as above to rule out COVID, RSV, flu etiologies. Reminded to quarantine until results available.     Reassurance no wheezing on exam today, therefore bronchitis not suspected. encouraged increase in flovent dose however to try to improve symptoms. Encouraged increasing to 2 puffs BID. ok to continue albuterol prn.    Recommended conservative care with rest, fluids, NSAIDs/APAP, honey (in tea or throat lozenges), delsym.   Rx of tylenol/codeine syrup provided for cough suppression.       Reviewed warning signs of sinusitis and pneumonia and instructed pt to call or return if new fever, purulent nasal drainage, sinus or tooth pain, increasing sputum production or new fever and worsening SOB             JilJohny DrillingD  [place]/28/2022  3:05 PM        ______________________

## 2020-07-31 NOTE — Telephone Encounter (Signed)
Last office visit:   04/25/2020  Patients upcoming appointments:  Future Appointments   Date Time Provider [place]   07/31/2020  2:40 PM [name], MD CAF None   08/03/2020  2:00 PM [name], MD CAF None   08/14/2020 10:30 AM Paraison, Cleda Clarks, MD COM / COB None       Recent Lab Values 04/14/2019 01/04/2018 12/17/2017 10/17/2017 10/17/2017 03/31/2017 10/29/2016   EXP DATE 2019-08-04 9/20 02/02/2018 11-03-18 08-04-18 04/04/18 04/04/2017   THCU - - - - - - -       Recent Lab results:  GENERAL CHEMISTRY   Recent Labs     04/25/20  1637   NA 140   K 4.5   CL 104   CO2 24   GAP 12   UN 10   CREAT 0.97*   GFRC 70   GFRB 81   GLU 79   CA 9.5      LIPID PROFILE   Recent Labs     04/25/20  1637 10/29/19  1444   CHOL 122 124   TRIG 92 91   HDL 38* 38*   LDLC 66 68      LIVER PROFILE   Recent Labs     04/25/20  1637   ALT 25   AST 18   ALK 105   TB 0.4      DIABETES THYROID   Recent Labs     04/25/20  1637 11/01/19  1739   HA1C 6.3* 6.3*    Recent Labs     04/25/20  1637   TSH 1.03         Pending/Orders Labs:  Lab Frequency Next Occurrence   Hemoglobin A1c Once 07/06/2020

## 2020-07-31 NOTE — Addendum Note (Signed)
Addended by: Vevelyn Pat on: 07/31/2020 03:21 PM     Modules accepted: Orders

## 2020-08-01 ENCOUNTER — Encounter: Payer: Self-pay | Admitting: Primary Care

## 2020-08-01 LAB — COVID-19 PCR

## 2020-08-01 LAB — COVID-19 NAAT (PCR): COVID-19 NAAT (PCR): NEGATIVE

## 2020-08-01 LAB — INFLUENZA B PCR: Influenza B PCR: 0

## 2020-08-01 LAB — INFLUENZA A: Influenza A PCR: 0

## 2020-08-01 LAB — RSV PCR: RSV PCR: 0

## 2020-08-01 MED ORDER — BENZONATATE 200 MG PO CAPS *I*
200.0000 mg | ORAL_CAPSULE | Freq: Three times a day (TID) | ORAL | 0 refills | Status: DC | PRN
Start: 2020-08-01 — End: 2021-01-22

## 2020-08-01 NOTE — Telephone Encounter (Signed)
Last office visit:   07/31/2020  Patients upcoming appointments:  Future Appointments   Date Time Provider [place]   08/03/2020  2:00 PM [name], MD CAF None   08/14/2020 10:30 AM Paraison, Cleda Clarks, MD COM / COB None       Recent Lab Values 04/14/2019 01/04/2018 12/17/2017 10/17/2017 10/17/2017 03/31/2017 10/29/2016   EXP DATE 2019-08-04 9/20 02/02/2018 11-03-18 08-04-18 04/04/18 04/04/2017   THCU - - - - - - -       Recent Lab results:  GENERAL CHEMISTRY   Recent Labs     04/25/20  1637   NA 140   K 4.5   CL 104   CO2 24   GAP 12   UN 10   CREAT 0.97*   GFRC 70   GFRB 81   GLU 79   CA 9.5      LIPID PROFILE   Recent Labs     04/25/20  1637 10/29/19  1444   CHOL 122 124   TRIG 92 91   HDL 38* 38*   LDLC 66 68      LIVER PROFILE   Recent Labs     04/25/20  1637   ALT 25   AST 18   ALK 105   TB 0.4      DIABETES THYROID   Recent Labs     04/25/20  1637 11/01/19  1739   HA1C 6.3* 6.3*    Recent Labs     04/25/20  1637   TSH 1.03         Pending/Orders Labs:  Lab Frequency Next Occurrence   Hemoglobin A1c Once 07/06/2020      Patient Name: Kaylee Harris   Birth Date: [birth year]   Address: [place], [place] [address]   Sex: Female   Rx Written Rx Dispensed Drug Quantity Days Supply Prescriber Name Prescriber [place] # Payment Method Dispenser   07/31/2020 07/31/2020 acetaminophen-codeine 120 mg-12 mg/5 ml solution  11m 6 MJohny Drilling GDarnell Level(MD) FDulce #02   07/10/2020 07/10/2020 tramadol hcl 50 mg tablet  42 14 CReginold [id number] #02   06/12/2020 06/15/2020 alprazolam 1 mg tablet  15 35 CReginold [id number] #19   06/15/2020 06/15/2020 tramadol hcl 50 mg tablet  42 14 MJohny Drilling GDarnell Level(MD) FDavis [place] #02   05/07/2020 05/09/2020 tramadol hcl 50 mg tablet  42 14 MJohny Drilling GDarnell Level(MD) FHart #02    03/14/2020 03/16/2020 alprazolam 1 mg tablet  15 35 CReginold [id number] #19   02/22/2020 02/23/2020 tramadol hcl 50 mg tablet  42 14 CReginold [id number] #02   01/28/2020 02/02/2020 alprazolam 1 mg tablet  15 15 MJohny Drilling GDarnell Level(MD) FSt. Thomas #19   12/15/2019 12/23/2019 tramadol hcl 50 mg tablet  42 14 BGraceann [id number] #02   12/08/2019 12/13/2019 tramadol hcl 50 mg tablet  42 14 BGraceann [id number] [place] #02   11/11/2019 11/18/2019 tramadol hcl 50 mg tablet  42 14 BGraceann [id number] #19   09/22/2019 09/27/2019 tramadol hcl 50 mg tablet  42 14 BGraceann [id number] #19

## 2020-08-01 NOTE — Telephone Encounter (Signed)
Prescribed during visit yesterday acetaminophen-codeine 120-12 MG/5ML solution Sig - Route: Take 5 mLs by mouth every 6 hours as needed for Pain Max daily dose: 20 mLs - Oral

## 2020-08-03 ENCOUNTER — Other Ambulatory Visit: Payer: Self-pay

## 2020-08-03 ENCOUNTER — Ambulatory Visit: Payer: Medicare (Managed Care) | Admitting: Primary Care

## 2020-08-03 NOTE — Telephone Encounter (Signed)
Currently scheduled for a SWV at 2 pm today, would you like [place] to put her in a side door and change the visit to an acute visit?

## 2020-08-03 NOTE — Telephone Encounter (Signed)
Patient appointment has been changed

## 2020-08-03 NOTE — Telephone Encounter (Addendum)
Please change to side door re: cough/AWV , 20 min

## 2020-08-03 NOTE — Progress Notes (Signed)
[facility]  Monthly Summary Note    Contact Achieved with Member This Month: 3/9   Method of Contact: phone   Assessment Utilized: comprehensive assessment   Names/Roles of participants: CM Thelma Barge and client Kaylee Harris.   Description of Client participation: active participation  Status of completion of assessment: comprehensive assessment completed and finalized in netsmart.   Summary of findings relevant to Care Management work: client needs assistance with accessing community resources. Client does okay for now with food, but anticipates she will struggle once DHS takes away extra [place] SNAP benefits.   Next steps to address findings: CM will continuing providing support as needed.   Additional notes: MC informed client that [employer] marketplace will be opening back up likely in July. Client was happy to hear about this and thanked CM. No additional needs at this time.

## 2020-08-03 NOTE — Telephone Encounter (Signed)
The patient has been scheduled

## 2020-08-03 NOTE — Telephone Encounter (Signed)
Please cancel appt today; reschedule for 1-2 mo for AWV (40min ok)

## 2020-08-04 ENCOUNTER — Encounter: Payer: Self-pay | Admitting: Student in an Organized Health Care Education/Training Program

## 2020-08-04 ENCOUNTER — Encounter: Payer: Self-pay | Admitting: Psychiatry

## 2020-08-07 ENCOUNTER — Other Ambulatory Visit: Payer: Self-pay | Admitting: Psychiatry

## 2020-08-07 DIAGNOSIS — E1165 Type 2 diabetes mellitus with hyperglycemia: Secondary | ICD-10-CM

## 2020-08-10 ENCOUNTER — Other Ambulatory Visit: Payer: Self-pay

## 2020-08-11 ENCOUNTER — Other Ambulatory Visit: Payer: Self-pay

## 2020-08-14 ENCOUNTER — Ambulatory Visit: Payer: Medicare (Managed Care) | Admitting: Student in an Organized Health Care Education/Training Program

## 2020-08-14 ENCOUNTER — Encounter: Payer: Self-pay | Admitting: Psychiatry

## 2020-08-15 ENCOUNTER — Other Ambulatory Visit: Payer: Self-pay

## 2020-08-15 ENCOUNTER — Encounter: Payer: Self-pay | Admitting: Gastroenterology

## 2020-08-17 ENCOUNTER — Other Ambulatory Visit: Payer: Self-pay

## 2020-08-17 DIAGNOSIS — F603 Borderline personality disorder: Secondary | ICD-10-CM

## 2020-08-17 DIAGNOSIS — F319 Bipolar disorder, unspecified: Secondary | ICD-10-CM

## 2020-08-17 MED ORDER — LAMOTRIGINE 200 MG PO TABS *I*
200.0000 mg | ORAL_TABLET | Freq: Every day | ORAL | 1 refills | Status: DC
Start: 2020-08-17 — End: 2021-02-08

## 2020-08-21 ENCOUNTER — Encounter: Payer: Self-pay | Admitting: Psychiatry

## 2020-08-22 ENCOUNTER — Other Ambulatory Visit: Payer: Self-pay | Admitting: Gastroenterology

## 2020-08-31 ENCOUNTER — Other Ambulatory Visit: Payer: Self-pay | Admitting: Family Medicine

## 2020-08-31 DIAGNOSIS — Z760 Encounter for issue of repeat prescription: Secondary | ICD-10-CM

## 2020-08-31 NOTE — Telephone Encounter (Signed)
Confidential Drug Report  Search Terms: Olubunmi Rothenberger, Dec 03, 1975   Search Date: 08/31/2020 13:56:55 PM   Searching on behalf of: [id number] - [name]   The Drug Utilization Report below displays all of the controlled substance prescriptions, if any, that your patient has filled in the last twelve months. The information displayed on this report is compiled from pharmacy submissions to the Department, and accurately reflects the information as submitted by the pharmacies.  This report was requested by: Denton Meek   Reference #: [id number]   Others' Prescriptions  Patient Name: Kaylee Harris   Birth Date: 04-05-1976   Address: [place], [place] [address]   Sex: Female   Rx Written Rx Dispensed Drug Quantity Days Supply Prescriber Name Prescriber [place] # Payment Method Dispenser   08/01/2020 08/01/2020 tramadol hcl 50 mg tablet  42 [place], [place] [id number] [employer] [place]. #02   07/31/2020 07/31/2020 acetaminophen-codeine 120 mg-12 mg/5 ml solution  111ml 6 [name], Darnell Level (MD) [place]. #02   07/10/2020 07/10/2020 tramadol hcl 50 mg tablet  42 [place], [place] [id number] [place]. #02   06/12/2020 06/15/2020 alprazolam 1 mg tablet  15 35 Reginold Agent [id number] [place]. #19   06/15/2020 06/15/2020 tramadol hcl 50 mg tablet  42 14 [name], Darnell Level (MD) [place]. #02   05/07/2020 05/09/2020 tramadol hcl 50 mg tablet  42 14 [name], Darnell Level (MD) [place]. #02   03/14/2020 03/16/2020 alprazolam 1 mg tablet  15 35 Reginold Agent [id number] [place]. #19   02/22/2020 02/23/2020 tramadol hcl 50 mg tablet  42 14 Reginold Agent [id number] [place]. #02   01/28/2020 02/02/2020 alprazolam 1 mg tablet  15 15 [name], Darnell Level (MD) [place]. #19   12/15/2019 12/23/2019 tramadol hcl 50 mg tablet  42 14 Graceann Congress [id number] [place]. #02   12/08/2019 12/13/2019 tramadol hcl 50 mg tablet  42 14 Graceann Congress [id number] [place]. #02   11/11/2019 11/18/2019 tramadol hcl 50 mg tablet  42 14 Graceann Congress [id number] [place]. #19   09/22/2019 09/27/2019 tramadol hcl 50 mg tablet  42 14 Graceann Congress [id number] [place]. #19     * - Drugs marked with an asterisk are compound drugs. If the compound drug is made up of more than one controlled substance, then each controlled substance will be a separate row in the table.

## 2020-08-31 NOTE — Telephone Encounter (Signed)
Search Terms: Jazlyn Moler, 04/04/1976   Search Date: 08/31/2020 08:41:57 AM   Searching on behalf of: [id number] - Jillian Moore   The Drug Utilization Report below displays all of the controlled substance prescriptions, if any, that your patient has filled in the last twelve months. The information displayed on this report is compiled from pharmacy submissions to the Department, and accurately reflects the information as submitted by the pharmacies.  This report was requested by:  [place]   Reference #: [id number]   Others' Prescriptions  Patient Name: Kaylee Harris   Birth Date: 06/25/1975   Address: [address] [place], [place] [address]   Sex: Female   Rx Written Rx Dispensed Drug Quantity Days Supply Prescriber Name Prescriber Dea # Payment Method Dispenser   08/01/2020 08/01/2020 tramadol hcl 50 mg tablet  42 14 Costello, Alice [id number] [employer] #02   07/31/2020 07/31/2020 acetaminophen-codeine 120 mg-12 mg/5 ml solution  118ml 6 Moore, Jillian, G (MD) [id number] [employer] #02   07/10/2020 07/10/2020 tramadol hcl 50 mg tablet  42 14 Costello, Alice [id number] [employer] #02   06/12/2020 06/15/2020 alprazolam 1 mg tablet  15 35 Costello, Alice [id number] [employer] #19   06/15/2020 06/15/2020 tramadol hcl 50 mg tablet  42 14 Moore, Jillian, G (MD) [id number] [employer] #02   05/07/2020 05/09/2020 tramadol hcl 50 mg tablet  42 14 Moore, Jillian, G (MD) [id number] [employer] #02   03/14/2020 03/16/2020 alprazolam 1 mg tablet  15 35 Costello, Alice [id number] [employer] #19   02/22/2020 02/23/2020 tramadol hcl 50 mg tablet  42 14 Costello, Alice [id number] [employer] #02   01/28/2020 02/02/2020 alprazolam 1 mg tablet  15 15 Moore, Jillian, G (MD) [id number] [employer] #19   12/15/2019  12/23/2019 tramadol hcl 50 mg tablet  42 14 Burke, Brandi [id number] [employer] #02   12/08/2019 12/13/2019 tramadol hcl 50 mg tablet  42 14 Burke, Brandi [id number] [employer] #02   11/11/2019 11/18/2019 tramadol hcl 50 mg tablet  42 14 Burke, Brandi [id number] [employer] #19   09/22/2019 09/27/2019 tramadol hcl 50 mg tablet  42 14 Burke, Brandi [id number] [employer] #19          Last office visit:   07/31/2020  Patients upcoming appointments:  Future Appointments   Date Time Provider Department Center   10/11/2020  2:00 PM Moore, Jillian, MD CAF None       Recent Lab Values 04/14/2019 01/04/2018 12/17/2017 10/17/2017 10/17/2017 03/31/2017 10/29/2016   EXP DATE 2019-08-04 9/20 02/02/2018 11-03-18 08-04-18 04/04/18 04/04/2017   THCU - - - - - - -       Recent Lab results:  GENERAL CHEMISTRY   Recent Labs     04/25/20  1637   NA 140   K 4.5   CL 104   CO2 24   GAP 12   UN 10   CREAT 0.97*   GFRC 70   GFRB 81   GLU 79   CA 9.5      LIPID PROFILE   Recent Labs     04/25/20  1637 10/29/19  1444   CHOL 122 124   TRIG 92 91   HDL 38* 38*   LDLC 66 68        LIVER PROFILE   Recent Labs     04/25/20  1637   ALT 25   AST 18   ALK 105   TB 0.4      DIABETES THYROID   Recent Labs     04/25/20  1637 11/01/19  1739   HA1C 6.3* 6.3*    Recent Labs     04/25/20  1637   TSH 1.03         Pending/Orders Labs:  Lab Frequency Next Occurrence   Hemoglobin A1c Once 07/06/2020

## 2020-09-01 ENCOUNTER — Other Ambulatory Visit: Payer: Self-pay | Admitting: Family Medicine

## 2020-09-05 ENCOUNTER — Other Ambulatory Visit: Payer: Self-pay | Admitting: Primary Care

## 2020-09-05 DIAGNOSIS — Z9109 Other allergy status, other than to drugs and biological substances: Secondary | ICD-10-CM

## 2020-09-05 MED ORDER — CETIRIZINE HCL 10 MG PO TABS *I*
10.0000 mg | ORAL_TABLET | Freq: Two times a day (BID) | ORAL | 0 refills | Status: DC
Start: 2020-09-05 — End: 2020-11-15

## 2020-09-06 LAB — UNMAPPED LAB RESULTS
Basophil # (HT): 0.1 10 3/uL — NL (ref 0.0–0.2)
Basophil % (HT): 1 % — NL (ref 0–2)
Eosinophil # (HT): 0.1 10 3/uL — NL (ref 0.0–0.5)
Eosinophil % (HT): 1 % — NL (ref 0–7)
Hematocrit (HT): 40 % — NL (ref 40–52)
Hemoglobin (HGB) (HT): 12.7 g/dL — NL (ref 11.5–16.0)
Lymphocyte # (HT): 2.1 10 3/uL — NL (ref 0.9–3.8)
Lymphocyte % (HT): 17 % — NL (ref 17–44)
MCHC (HT): 31.8 g/dL — ABNORMAL LOW (ref 32.0–36.0)
MCV (HT): 91 fL — NL (ref 81–99)
Mean Corpuscular Hemoglobin (MCH) (HT): 28.9 pg — NL (ref 26.0–34.0)
Monocyte # (HT): 0.7 10 3/uL — NL (ref 0.2–1.0)
Monocyte % (HT): 6 % — NL (ref 4–15)
Neutrophil # (HT): 8.7 10 3/uL — ABNORMAL HIGH (ref 1.5–7.7)
Platelets (HT): 315 10 3/uL — NL (ref 140–400)
RBC (HT): 4.39 10 6/uL — NL (ref 3.80–5.20)
RDW (HT): 13 % — NL (ref 11.5–15.0)
Seg Neut % (HT): 74 % — NL (ref 43–75)
WBC (HT): 11.8 10 3/uL — ABNORMAL HIGH (ref 4.0–10.0)

## 2020-09-22 ENCOUNTER — Other Ambulatory Visit: Payer: Self-pay

## 2020-09-22 ENCOUNTER — Other Ambulatory Visit: Payer: Self-pay | Admitting: Psychiatry

## 2020-09-22 DIAGNOSIS — E1165 Type 2 diabetes mellitus with hyperglycemia: Secondary | ICD-10-CM

## 2020-09-22 MED ORDER — DEXCOM G6 RECEIVER DEVI *A*
0 refills | Status: DC | PRN
Start: 2020-09-22 — End: 2020-09-26

## 2020-09-26 ENCOUNTER — Other Ambulatory Visit: Payer: Self-pay

## 2020-09-26 DIAGNOSIS — E1165 Type 2 diabetes mellitus with hyperglycemia: Secondary | ICD-10-CM

## 2020-09-26 MED ORDER — DEXCOM G6 SENSOR MISC *A*
3 refills | Status: DC
Start: 2020-09-26 — End: 2021-07-06

## 2020-09-26 MED ORDER — DEXCOM G6 TRANSMITTER MISC *A*
3 refills | Status: DC
Start: 2020-09-26 — End: 2021-07-06

## 2020-09-26 MED ORDER — DEXCOM G6 RECEIVER DEVI *A*
0 refills | Status: DC | PRN
Start: 2020-09-26 — End: 2021-07-06

## 2020-09-26 NOTE — Telephone Encounter (Signed)
[address] is requesting new scripts for all Dexcom supplies.

## 2020-09-28 ENCOUNTER — Other Ambulatory Visit: Payer: Self-pay

## 2020-09-28 ENCOUNTER — Other Ambulatory Visit: Payer: Self-pay | Admitting: Family Medicine

## 2020-09-28 ENCOUNTER — Other Ambulatory Visit: Payer: Self-pay | Admitting: Primary Care

## 2020-09-28 DIAGNOSIS — K219 Gastro-esophageal reflux disease without esophagitis: Secondary | ICD-10-CM

## 2020-09-28 DIAGNOSIS — K589 Irritable bowel syndrome without diarrhea: Secondary | ICD-10-CM

## 2020-09-28 MED ORDER — FAMOTIDINE 20 MG PO TABS *I*
ORAL_TABLET | ORAL | 1 refills | Status: DC
Start: 2020-09-28 — End: 2021-03-22

## 2020-09-28 MED ORDER — DIPHENOXYLATE-ATROPINE 2.5-0.025 MG PO TABS *I*
1.0000 | ORAL_TABLET | Freq: Four times a day (QID) | ORAL | 0 refills | Status: DC | PRN
Start: 2020-09-28 — End: 2021-03-02

## 2020-09-28 NOTE — Telephone Encounter (Signed)
Confidential Drug Report  Search Terms: Rebekka Lobello, October 28, 1975   Search Date: 09/28/2020 15:39:03 PM   Searching on behalf of: [id number] - [name]   The Drug Utilization Report below displays all of the controlled substance prescriptions, if any, that your patient has filled in the last twelve months. The information displayed on this report is compiled from pharmacy submissions to the Department, and accurately reflects the information as submitted by the pharmacies.  This report was requested by: Denton Meek   Reference #: [id number]   Others' Prescriptions  Patient Name: Kaylee Harris   Birth Date: 1976-04-28   Address: [place], [place] [address]   Sex: Female   Rx Written Rx Dispensed Drug Quantity Days Supply Prescriber Name Prescriber [place] # Payment Method Dispenser   08/31/2020 09/05/2020 alprazolam 1 mg tablet  15 35 Reginold Agent [id number] [place]. #19   08/31/2020 09/04/2020 tramadol hcl 50 mg tablet  42 14 Reginold Agent [id number] [place]. #02   08/01/2020 08/01/2020 tramadol hcl 50 mg tablet  42 14 Reginold Agent [id number] [place]. #02   07/31/2020 07/31/2020 acetaminophen-codeine 120 mg-12 mg/5 ml solution  141ml 6 [name], Darnell Level (MD) [place]. #02   07/10/2020 07/10/2020 tramadol hcl 50 mg tablet  42 14 Reginold Agent [id number] [place]. #02   06/12/2020 06/15/2020 alprazolam 1 mg tablet  15 35 Reginold Agent [id number] [place]. #19   06/15/2020 06/15/2020 tramadol hcl 50 mg tablet  42 14 [name], Darnell Level (MD) [place]. #02   05/07/2020 05/09/2020 tramadol hcl 50 mg tablet  42 14 [name], Darnell Level (MD) [place]. #02   03/14/2020 03/16/2020 alprazolam 1 mg tablet  15 35 Reginold Agent [id number] [place]. #19    02/22/2020 02/23/2020 tramadol hcl 50 mg tablet  42 14 Reginold Agent [id number] [place]. #02   01/28/2020 02/02/2020 alprazolam 1 mg tablet  15 15 [name], Darnell Level (MD) [place]. #19   12/15/2019 12/23/2019 tramadol hcl 50 mg tablet  42 14 Graceann Congress [id number] [place]. #02   12/08/2019 12/13/2019 tramadol hcl 50 mg tablet  42 14 Graceann Congress [id number] [place]. #02   11/11/2019 11/18/2019 tramadol hcl 50 mg tablet  42 14 Graceann Congress [id number] [place]. #19     * - Drugs marked with an asterisk are compound drugs. If the compound drug is made up of more than one controlled substance, then each controlled substance will be a separate row in the table.

## 2020-09-29 ENCOUNTER — Encounter: Payer: Self-pay | Admitting: Gastroenterology

## 2020-10-01 ENCOUNTER — Other Ambulatory Visit: Payer: Self-pay | Admitting: Family Medicine

## 2020-10-01 DIAGNOSIS — Z760 Encounter for issue of repeat prescription: Secondary | ICD-10-CM

## 2020-10-03 NOTE — Telephone Encounter (Signed)
Last office visit:   07/31/2020  Patients upcoming appointments:  Future Appointments   Date Time Provider [place]   10/11/2020  2:00 PM [name], MD CAF None       Recent Lab Values 04/14/2019 01/04/2018 12/17/2017 10/17/2017 10/17/2017 03/31/2017 10/29/2016   EXP DATE 2019-08-04 9/20 02/02/2018 11-03-18 08-04-18 04/04/18 04/04/2017   THCU - - - - - - -       Recent Lab results:  GENERAL CHEMISTRY   Recent Labs     04/25/20  1637   NA 140   K 4.5   CL 104   CO2 24   GAP 12   UN 10   CREAT 0.97*   GFRC 70   GFRB 81   GLU 79   CA 9.5      LIPID PROFILE   Recent Labs     04/25/20  1637 10/29/19  1444   CHOL 122 124   TRIG 92 91   HDL 38* 38*   LDLC 66 68      LIVER PROFILE   Recent Labs     04/25/20  1637   ALT 25   AST 18   ALK 105   TB 0.4      DIABETES THYROID   Recent Labs     04/25/20  1637 11/01/19  1739   HA1C 6.3* 6.3*    Recent Labs     04/25/20  1637   TSH 1.03         Pending/Orders Labs:  Lab Frequency Next Occurrence   Hemoglobin A1c Once 07/06/2020         Patient Name: Kaylee Harris   Birth Date: 11/03/1975   Address: [place], [place] [address]   Sex: Female   Rx Written Rx Dispensed Drug Quantity Days Supply Prescriber Name Prescriber Dea # Payment Method Dispenser   08/31/2020 09/05/2020 alprazolam 1 mg tablet  15 35 Reginold Agent [id number] [place]. #19   08/31/2020 09/04/2020 tramadol hcl 50 mg tablet  42 14 Reginold Agent [id number] [place]. #02   08/01/2020 08/01/2020 tramadol hcl 50 mg tablet  42 14 Reginold Agent [id number] [place]. #02   07/31/2020 07/31/2020 acetaminophen-codeine 120 mg-12 mg/5 ml solution  18m 6 MJohny Drilling GDarnell Level(MD) [place] #02   07/10/2020 07/10/2020 tramadol hcl 50 mg tablet  42 14 CReginold [id number] #02   06/12/2020 06/15/2020 alprazolam 1 mg tablet  15 35 CReginold [id number] [place] #19   06/15/2020 06/15/2020 tramadol hcl 50 mg tablet  42 14 MJohny Drilling GDarnell Level(MD) FP[place] #02   05/07/2020 05/09/2020 tramadol hcl 50 mg tablet  42 14 MJohny Drilling GDarnell Level(MD) [place] #02   03/14/2020 03/16/2020 alprazolam 1 mg tablet  15 35 CReginold [id number] #19   02/22/2020 02/23/2020 tramadol hcl 50 mg tablet  42 14 CReginold [id number]#02   01/28/2020 02/02/2020 alprazolam 1 mg tablet  15 15 MJohny Drilling GDarnell Level(MD) [place] #19   12/15/2019 12/23/2019 tramadol hcl 50 mg tablet  42 14 BGraceann [id number] [place]#02   12/08/2019 12/13/2019 tramadol hcl 50 mg tablet  42 14 BGraceann [id number] #[place]  11/11/2019 11/18/2019 tramadol hcl 50 mg tablet  42 14 [place], [place]. #19

## 2020-10-05 ENCOUNTER — Encounter: Payer: Self-pay | Admitting: Gastroenterology

## 2020-10-05 ENCOUNTER — Encounter: Payer: Self-pay | Admitting: Psychiatry

## 2020-10-09 ENCOUNTER — Encounter: Payer: Self-pay | Admitting: Primary Care

## 2020-10-10 ENCOUNTER — Encounter: Payer: Self-pay | Admitting: Gastroenterology

## 2020-10-10 ENCOUNTER — Other Ambulatory Visit: Payer: Self-pay

## 2020-10-11 ENCOUNTER — Ambulatory Visit: Payer: Medicare (Managed Care) | Admitting: Primary Care

## 2020-10-12 ENCOUNTER — Encounter: Payer: Self-pay | Admitting: Primary Care

## 2020-10-12 ENCOUNTER — Ambulatory Visit: Payer: Medicare (Managed Care) | Attending: Primary Care | Admitting: Primary Care

## 2020-10-12 ENCOUNTER — Other Ambulatory Visit
Admission: RE | Admit: 2020-10-12 | Discharge: 2020-10-12 | Disposition: A | Discharge status: Home / Self Care | Payer: Medicare (Managed Care) | Source: Ambulatory Visit

## 2020-10-12 VITALS — BP 126/70 | HR 81 | Ht 67.01 in | Wt 233.0 lb

## 2020-10-12 DIAGNOSIS — I7300 Raynaud's syndrome without gangrene: Secondary | ICD-10-CM | POA: Insufficient documentation

## 2020-10-12 DIAGNOSIS — E118 Type 2 diabetes mellitus with unspecified complications: Secondary | ICD-10-CM | POA: Insufficient documentation

## 2020-10-12 DIAGNOSIS — L0291 Cutaneous abscess, unspecified: Secondary | ICD-10-CM | POA: Insufficient documentation

## 2020-10-12 DIAGNOSIS — E1165 Type 2 diabetes mellitus with hyperglycemia: Secondary | ICD-10-CM | POA: Insufficient documentation

## 2020-10-12 DIAGNOSIS — M797 Fibromyalgia: Secondary | ICD-10-CM | POA: Insufficient documentation

## 2020-10-12 DIAGNOSIS — Z0000 Encounter for general adult medical examination without abnormal findings: Secondary | ICD-10-CM | POA: Insufficient documentation

## 2020-10-12 LAB — URINALYSIS WITH MICROSCOPIC
Bacteria,UA: NONE SEEN
Blood,UA: NEGATIVE
Glucose,UA: NEGATIVE
Hyaline Casts,UA: NONE SEEN /lpf (ref 0–5)
Ketones, UA: NEGATIVE
Leuk Esterase,UA: NEGATIVE
Nitrite,UA: NEGATIVE
Specific Gravity,UA: 1.018 (ref 1.002–1.030)
pH,UA: 7 (ref 5.0–8.0)

## 2020-10-12 LAB — PAIN CLINIC PROFILE
Amphetamine,UR: NEGATIVE
Benzodiazepinen,UR: NEGATIVE
Cocaine/Metab,UR: NEGATIVE
Opiates,UR: NEGATIVE
Oxycodone/Oxymorphone,UR: NEGATIVE
THC Metabolite,UR: POSITIVE

## 2020-10-12 LAB — CBC
Hematocrit: 42 % (ref 34–45)
Hemoglobin: 13.5 g/dL (ref 11.2–15.7)
MCH: 30 pg (ref 26–32)
MCHC: 32 g/dL (ref 32–36)
MCV: 92 fL (ref 79–95)
Platelets: 347 10*3/uL (ref 160–370)
RBC: 4.5 MIL/uL (ref 3.9–5.2)
RDW: 13 % (ref 11.7–14.4)
WBC: 10 10*3/uL (ref 4.0–10.0)

## 2020-10-12 LAB — COMPREHENSIVE METABOLIC PANEL
ALT: 19 U/L (ref 0–35)
AST: 17 U/L (ref 0–35)
Albumin: 4.3 g/dL (ref 3.5–5.2)
Alk Phos: 87 U/L (ref 35–105)
Anion Gap: 11 (ref 7–16)
Bilirubin,Total: 0.3 mg/dL (ref 0.0–1.2)
CO2: 23 mmol/L (ref 20–28)
Calcium: 9.5 mg/dL (ref 8.6–10.2)
Chloride: 108 mmol/L (ref 96–108)
Creatinine: 0.91 mg/dL (ref 0.51–0.95)
Glucose: 74 mg/dL (ref 60–99)
Lab: 13 mg/dL (ref 6–20)
Potassium: 4.2 mmol/L (ref 3.3–5.1)
Sodium: 142 mmol/L (ref 133–145)
Total Protein: 6.6 g/dL (ref 6.3–7.7)
eGFR BY CREAT: 79 *

## 2020-10-12 LAB — MULTIPLE ORDERING DOCS

## 2020-10-12 LAB — HM HIV SCREENING OFFERED

## 2020-10-12 LAB — TSH: TSH: 1.48 u[IU]/mL (ref 0.27–4.20)

## 2020-10-12 LAB — HM DIABETES FOOT EXAM

## 2020-10-12 MED ORDER — SULFAMETHOXAZOLE-TRIMETHOPRIM 800-160 MG PO TABS *I*
1.0000 | ORAL_TABLET | Freq: Two times a day (BID) | ORAL | 0 refills | Status: AC
Start: 2020-10-12 — End: 2020-10-19

## 2020-10-12 NOTE — Progress Notes (Signed)
Visit performed as:             Office Visit, met with patient in person    Today we reviewed and updated Kaylee Harris smoking status, activities of daily living, depression screen, fall risk, medications and allergies.   I have counseled the patient in the above areas.     Subjective:     Chief Complaint: Kaylee Harris is a 45 y.o. female here for a/an Subsequent Annual Medicare Visit    In general, Kaylee Harris rates their overall health as:  fair      Patient Care Team:  [name], MD as PCP - Allegra Grana, MD  Tresa Res, LCSW-R as Social Worker  Nordquist, Janett Billow, RN as Registered Nurse  Nada Libman, NP as Consulting Provider (Endocrinology)  Grayling Congress, MD as Consulting Provider (Physical Medicine and Rehabilitation)  Vicki Mallet, OD (Ophthalmology)  Lovenia Kim as [place] - Internal     Current Outpatient Medications on File Prior to Visit   Medication Sig Dispense Refill    cetirizine (ZYRTEC) 10 mg tablet Take 1 tablet (10 mg total) by mouth 2 times daily 180 tablet 0    fluticasone (FLONASE) 50 MCG/ACT nasal spray SPRAY 1 SPRAY IN EACH NOSTRIL DAILY 48 mL 3    pantoprazole (PROTONIX) 40 mg EC tablet TAKE 1 TABLET BY MOUTH EVERY DAY, SWALLOW WHOLE. DO NOT BREAK, CHEW OR CRUSH 90 tablet 3    lamoTRIgine (LAMICTAL) 200 mg tablet Take 1 tablet (200 mg total) by mouth daily  Replacing 156m dose 90 tablet 1    TRULICITY 04.27MCW/2.3JSpen INJECT 0.75 MG SUBCUTANEOUSLY (UNDER THE SKIN) EVERY 7 DAYS 2 mL 5    topiramate (TOPAMAX) 100 mg tablet TAKE 1 TABLET BY MOUTH TWO TIMES DAILY 180 tablet 3    insulin lispro 100 UNIT/ML injection vial Inject as directed via pump up to Maximum  60  Units/day 60 mL 3    busPIRone (BUSPAR) 30 mg tablet TAKE 1 TABLET BY MOUTH TWO TIMES DAILY 180 tablet 3    DULoxetine (CYMBALTA) 60 mg DR capsule TAKE 1 CAPSULE BY MOUTH EVERY DAY 90 capsule 3    dicyclomine (BENTYL) 10 mg capsule Take 1 capsule (10 mg  total) by mouth 4 times daily (before meals and nightly)  TAKE 1 CAPSULE BY MOUTH FOUR TIMES DAILY BEFORE MEALS AND NIGHTLY 360 capsule 1    QUEtiapine (SEROQUEL XR) 50 mg 24 hr tablet Take 1 tablet (50 mg total) by mouth nightly  Swallow whole. Do not chew, crush, or break. 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 1 each 2    ascorbic acid (VITAMIN C) 100 MG tablet Take 5 tablets (500 mg total) by mouth daily 100 tablet 3    atorvastatin (LIPITOR) 40 MG tablet Take 1 tablet (40 mg total) by mouth daily (with dinner) 90 tablet 3    metoprolol (TOPROL-XL) 25 MG 24 hr tablet Take by mouth daily  midodrine (PROAMATINE) 10 MG tablet Take 1 tablet (10 mg total) by mouth 3 times daily 90 tablet 0    traMADol (ULTRAM) 50 mg tablet TAKE 1 TABLET BY MOUTH EVERY 8 HOURS AS NEEDED FOR HEAD AND ABDOMINAL PAIN MAXIMUM DAILY DOSE OF 3 PER DAY 42 tablet 0    famotidine (PEPCID) 20 mg tablet TAKE 1 TABLET BY MOUTH EVERY EVENING AS NEEDED FOR HEARTBURN 90 tablet 1    diphenoxylate-atropine (LOMOTIL) 2.5-0.025 MG per tablet Take 1-2 tablets by mouth 4 times daily as needed for Diarrhea  Max daily dose: 8 tablets Code D 240 tablet 0    albuterol HFA (PROVENTIL, VENTOLIN, PROAIR HFA) 108 (90 Base) MCG/ACT inhaler INHALE 1 TO 2 PUFFS BY MOUTH EVERY 6 HOURS AS NEEDED FOR WHEEXING. SHAKE WELL BEFORE EACH USE 18 g 1    Continuous Blood Gluc Receiver (DEXCOM G6 RECEIVER) By no specified route as needed  Use as directed to monitor blood glucose levels up to 10 x daily 1 each 0    Continuous Blood Gluc Sensor (DEXCOM G6 SENSOR) Apply sensor as  directed. Change every 10 days. 9 each 3    Continuous Blood Gluc Transmit (DEXCOM G6 TRANSMITTER) Attach to sensor. Change every 90 days 1 each 3    ascorbic acid (VITAMIN C) 500 MG tablet TAKE 1 TABLET BY MOUTH EVERY DAY 100 tablet 3    ALPRAZolam (XANAX) 1 mg tablet TAKE 1 TABLET BY MOUTH THREE TIMES WEEKLY AS NEEDED FOR ANXIETY MAXIMUM DAILY DOSE OF 1 PER DAY 15 tablet 0    benzonatate (TESSALON) 200 mg capsule Take 1 capsule (200 mg total) by mouth 3 times daily as needed for Cough 30 capsule 0    Alcohol Swabs (B-D SINGLE USE SWABS REGULAR) PADS USE 1 PAD TOPICALLY TWO TIMES DAILY TO CHECK BLOOD GLUCOSE 100 each 3    acetaminophen-codeine 120-12 MG/5ML solution Take 5 mLs by mouth every 6 hours as needed for Pain  Max daily dose: 20 mLs 118 mL 0    triamcinolone (KENALOG) 0.025 % ointment Apply topically 2 times daily  to the following areas: L underarm 15 g 0    insulin lispro 100 UNIT/ML injection vial Inject as directed via pump up to Maximum  60  Units/day. Discard vial 28 days after first use. 60 mL 3    SUMAtriptan (IMITREX) 50 mg tablet Take at onset of headache. May repeat once in 2 hours. TAKE 1 TABLET BY MOUTH AS NEEDED FOR MIGRAINE, TAKE AT ONSET OF HEADACHE, MAY REPEAT ONCE IN 2 HOURS 27 tablet 1    blood glucose test strip Test  8 times a day.   Brand name of strips Contour NEXT test strips for linked omnipod pump 250 each 11    clotrimazole (LOTRIMIN) 1 % cream Apply topically 2 times daily  for Skin Infection due to Candida Yeast 60 g 3    Insulin Disposable Pump (OMNIPOD DASH 5 PACK PODS) MISC By 1 device no specified route as needed Change every 2 days = 45 pods for 90 days supply = 9 (5packs) 45 each 3    promethazine (PHENERGAN) 25 MG tablet Take 1 tablet (25 mg total) by mouth every 4-6 hours as needed 30 tablet 3    triamcinolone (KENALOG) 0.5 % cream Apply topically 2 times daily to the following areas: arms 45 g 1    clindamycin (CLEOCIN T) 1 % external solution APPLY  TOPICALLY TWO TIMES DAILY, CONTINUE UNTIL IMPROVEMENT IN CYSTS 60  mL 1    Continuous Blood Gluc Receiver (DEXCOM G6 RECEIVER) By no specified route as needed 9 each 3    benzoyl peroxide 10 % LIQD external liquid Apply topically 2 times daily to the following areas: under arms, under breast, groin 227 g 3    LIDOCAINE HCL URETHRAL/MUCOSAL 2 % jelly APPLY TOPICALLY THREE TIMES DAILY AS NEEDED FOR PAIN 30 mL 4    insulin syringe-needle U-100 (BD INSULIN SYRINGE U/F) 31G X 5/16" 0.3 ML HALF-UNIT USE THREE TIMES DAILY AS INSTRUCTED 100 each 10    blood pressure monitor, automatic with arm cuff Use as directed to monitor blood pressure 1 each 0    incontinence supply disposable Order Description:  Wear as needed for stool leakage 100 each 3    zinc oxide 20 % ointment Apply topically as needed for Dry Skin 30 g 3    glucagon (BAQSIMI) 3 MG/DOSE nasal powder Inhale one dose (3 mg) into one nostril once as needed for low blood sugar. If no response in 15 min, inhale second dose from new device 2 each 5    lancets (BAYER MICROLET) Use   8  times per day as instructed for blood glucose testing. 250 each 11    azelastine (OPTIVAR) 0.05 % ophthalmic solution Place 1 drop into both eyes 2 times daily      SUMAtriptan refill (IMITREX STATDOSE) 6 MG/0.5ML injection INJECT 0.5ML UNDER THE SKIN ONCE AS NEEDED FOR MIGRAINE. MAY REPEAT DOSE ONCE AFTER 1 HOUR 2 Syringe 5    glucagon (GLUCAGEN) 1 MG injection kit Inject 1 mg into the skin once as needed for Low blood sugar   Discard any unused portion. 1 each 5    Non-System Medication The above patient is followed in our clinic and cannot resume work permanently. 1 each 1     No current facility-administered medications on file prior to visit.     Allergies   Allergen Reactions    Morphine Itching    Trazodone Anaphylaxis    Seasonal Allergies Itching    Lidocaine Rash     Patches caused a rash    Nsaids Other (See Comments)     Bleeding/epistaxis    Other [Other]  Other (See Comments)     Derma Bond(skin glue) pruritis/redness and c/o respiratory distress.   Received name: Other     Patient Active Problem List    Diagnosis Date Noted    At risk for abuse of opiates 06/29/2014     Priority: High     DO NOT GIVE IV NARCOTICS OR RX      History of repeated overdose 06/29/2014     Priority: High     SA with OD 04/2014, and again in 05/2014. sent to [facility] program (dismissed 06/2014 due to frequent abscenses)      Borderline personality disorder 06/29/2014     Priority: High    Fibromyalgia 03/24/2013     Priority: High     Cymbalta -> lyrica 01/22/2017       Syncope and collapse- recurrent, working diagnosis vascular vs psychogenic. Non-arrythmic per Dr Lovena Le 10/2014 03/24/2013     Priority: High     Hospitalized in 09/2011 with angina, had normal cardiac catheterization then developed syncopal episodes and hypotension following this hospitalization.   24h holter 10/2011 which showed brief mobitz 1, +tilt table testing, treated with Florinef, compression stockings, fluids/salt liberalization.   Seemed to have improvement for ~1 yr, then recurrent syncope in  10/2012. Resolved with use of compression stockings and avoidance of gluten  Loop monitor placed 10/1 by [facility]; last seen 5/11 with normal sinus rhythm even during episodes. Transitioning off ranexa as of 09/13/13 to decrease medication burden  Nuclear stress test 7/27 negative. Transitioned from theophylline to midodrine 7/7    05/2014: taken off all meds due to escalating episodes and no cardiac etiology. Felt to be psychogenic per discussion with Dr Kerin Ransom and [name] (psych). Given protective helmet and will focus more on psychiatric treatment/care. Both pt and wife are not in agreement with this diagnosis    07/2014: seen by neurologist and requested second opinion from different cardiologist and EEG. Underwent one time EEG (normal 3/24), if neg will pursue extended monitoring    09/05/14 referred to Dr Lovena Le for  second opinion in cardiology  Repeat tilt tabel 09/28/14 + and suggestive of POTS although per Dr Tanna Furry notes "hemodynamics not c/w POTS"  10/2014: started on atenolol and midodrine, cogentin d/c-ed  02/07/2015: repeat evaluation by [name], [facility] cardiologist; recommended consideration of prolonged inpatient monitoring (such as [facility] inpatient neuro with camera monitoring, EEG and HR monitoring).  May need to consider migraine variant as a cause of syncope.  Cardiologist's comments on meds: better after stopping Cogentin, now on Abilify for bipolar, tolerating atenolol, increase midodrine to 3x/day, try salt tablets, support hose daily, continue abd binder.)  She does not have cardiac or arrhythmia problems.  Needs to continue intensive treatment with psychiatry and neurology.  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 sent to [facility] program (dismissed 06/2014 due to frequent abscenses)  6/16: cogentin and latuda d/c-ed , transitioned to abilify  06/2015: abilify decreased due to uncontrolled DM      DM (diabetes mellitus), type 2, uncontrolled 03/09/2009     Priority: High     Undergoing Lantus titration; previously intolerant of glipizide and metformin  10/14/2014 mealtime insulin  06/2015 trial of splitting lantus (no change), increasing lantus and humalog  07/2015 transitioned to GLP1 agonist and VGO      Migraine with aura 03/09/2009     Priority: Medium    Hyperlipemia 03/09/2009     Priority: Medium             IUD (intrauterine device) in place 03/24/2013     Priority: Low     10/29/11  11/03/2017        Asthma 03/09/2009     Priority: Low     Created by Conversion        Abnormal CXR 08/07/2019     12/20- related to bony island seen on CT?  Plan to repeat imaging in 40mo      Hidradenitis 08/03/2019     Put on doxycycline.  Followed by Payton Mccallum.      First degree heart block 12/18/2017     PR 226 8/19      Lumbar facet arthropathy 07/03/2017     Moderate lumbar DJD on xray 12/18  MRI shows mild disc dessication and facet joint swelling at L4/5,  no central stenosis or herniation  REcommended exercise  05/20/17 s/p b/l L4/5 facet joint injection    Pending median branch block protocol with plan towards pursuing radiofrequency ablation      Labial abscess 03/28/2017    Right hip pain 02/25/2017     S/p TB injection 02/2017      Morbid obesity with BMI of 40.0-44.9, adult 10/17/2016    Hematuria 10/17/2016     Likely 2/2 bladder catheter trauma  Felt to have ?bladder mass on first cystoscopy. Repeat was neg for mass      GERD (gastroesophageal reflux disease) 41/93/7902    TMJ click, left 40/97/3532    Long term (current) use of insulin, Dermal Adhesed V-Go Insulin Delivery Device 10/04/2016    POTS (postural orthostatic tachycardia syndrome) 01/15/2016    Anxiety 01/14/2016    Bipolar 1 disorder 05/03/2015    Traumatic hemorrhage of right cerebrum 05/03/2015    Fatty liver disease, nonalcoholic 99/24/2683     Hepatomegaly (19cm) on 09/25/14 [place], normal LFTs      No diabetic retinopathy in both eyes 04/05/2014    Meralgia paresthetica of left side 10/06/2013    Mixed obsessional thoughts and acts 10/04/2013    Carpal tunnel syndrome 10/04/2013     EMG 09/2013 neg for denervation, R>L carpal tunnel. Recommended splinting      IBS (irritable bowel syndrome) 05/31/2013     Seen in ED 5/21 and 22 with RUQ pain, [place] neg for gallstones, labs normal. US showed hepatomegaly with steatosis  Chronic diarrhea and pain since. 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 Lower abdomen with cellulitis changes, wound probed and purulent material expressed.  Had PICC line for "multiple infiltrations" of what? D/c-ed home on 10d of Augmentin 9/11.      Anginal pain     Anxiety     Arthritis     Asthma     Depression     bipolar    Diabetes mellitus     Previously on SU and metformin, now diet controlled    Diverticulitis 09/2010    Dysfunctional uterine bleeding     Fibromyalgia     GERD (gastroesophageal reflux disease)     GERD (gastroesophageal reflux disease) 10/04/2016    High blood pressure     Hyperlipidemia     Liver disease     fatty liver     Long term (current) use of insulin, Dermal Adhesed V-Go Insulin Delivery Device 10/04/2016    Migraine     Neuromuscular disorder     POTS (postural orthostatic tachycardia syndrome)     Sebaceous cyst of breast     right axilla    TMJ click, left 08/04/9620    Varicella      Past Surgical History:   Procedure Laterality Date    APPENDECTOMY  2016    arthroscopic shoulder surgery Right 2010    Related to lifting    CARDIAC CATHETERIZATION  09/2011    negative    COLON SURGERY  COSMETIC SURGERY Bilateral 12/12/2017    B/L internal maxillary arteries embolized due to recurrent epistaxis, Dr Linard Millers    DILATION AND CURETTAGE OF UTERUS  2005, 2007    x2 for menorraghia    LEFT COLECTOMY  01/03/14    Dr Tresa Res    loop recorder  Feb 03 2014    loop recorder removal      PR COLONOSCOPY THRU COLOTOMY N/A 02/16/2016    Procedure: COLONOSCOPY;  Surgeon: Kelly Splinter, MD;  Location: [facility];  Service: GI    PR  CYSTOURETHROSCOPY,BIOPSY N/A 10/17/2016    Procedure: CYSTOSCOPY BLADDER ;  Surgeon: Ed Blalock, MD;  Location[facility];  Service: Urology    PR EDG TRANSORAL BIOPSY SINGLE/MULTIPLE N/A 10/29/2016    Procedure: EGD;  Surgeon: Kelly Splinter, MD;  Location: [facility];  Service: GI    TONSILLECTOMY       Family History   Problem Relation Age of Onset    Hypertension Father     Diabetes Father     Kidney Disease Father     Hyperlipidemia Father     Heart attack Father 103    Other Father         PVD    Heart Disease Father     Hypertension Mother     Hyperlipidemia Mother     Heart attack Mother 73    Diabetes Mother     Eczema Mother     Psoriasis Mother     Heart Disease Mother     Hypertension Brother     Heart Disease Brother 46        prinzmetal's angina    Heart Disease Sister         currently having work up    [place] Sister     Hypertension Sister     Breast cancer Maternal Grandmother     Stroke Other     Thyroid disease Sister     Thyroid cancer Sister     Anesthesia problems Neg Hx      Social History     Socioeconomic History    Marital status: Married     Spouse name: Not on file    Number of children: Not on file    Years of education: Not on file    Highest education level: Not on file   Occupational History    Not on file   Tobacco Use    Smoking status: Former Smoker     Packs/day: 0.50     Years: 3.00     Pack years: 1.50     Types: Cigarettes    Smokeless tobacco: Never Used    Tobacco comment: quit age late 39s   Substance and Sexual Activity    Alcohol use: Yes     Alcohol/week: 0.0 standard drinks     Comment: 0-1 drink/ month    Drug use: No    Sexual activity: Yes     Partners: Female     Birth control/protection: I.U.D.   Social History Narrative    ** Merged History Encounter **         Married to [employer], recently relocated back to [place] from [place] due to family stressors. 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        Objective:     Vital Signs: BP 126/70 (BP Location: Left arm, Patient Position: Sitting)    Pulse  81    Ht 1.702 m (5' 7.01")    Wt 105.7 kg (233 lb)    SpO2 98%    BMI 36.48 kg/m    BMI: Body mass index is 36.48 kg/m.    Vision Screening Results (Welcome visit only):  No exam data present    Depression Screening Results:  Recent Review Flowsheet Data     PHQ Scores 10/12/2020 04/25/2020 12/17/2019 10/25/2019 08/03/2019 10/29/2018 10/20/2017    PSQ2 Q1 - Interest/Pleasure - - Y Y N N N    PSQ2 Q2 - Down, Depressed, Hopeless - - Y Y - N N    PHQ Q9 - Better Off Dead - 1 1 0 - - -    PHQ Calculated Score 0 _0 - - -    PHQ Manual Score - - 14 - - - -        Opioid Use/DAST- 10 Screening Results:   How many times in the past year have you used an illegal drug or used a prescription medication for nonmedical reasons?: 0 (10/12/2020 11:48 AM)    Activities of Daily Living/Functional Screening Results:  Is the person deaf or does he/she have serious difficulty hearing?: N  Is this person blind or does he/she have serious difficulty seeing even when wearing glasses?: N  *Vision Status: Visual aid   Does this person have serious difficulty walking or climbing stairs?: Y  *Walks in Home: Independent  *Climbing Stairs: Needs Assistance  Does this person have difficulty dressing or bathing?: Y  *Dressing: Independent  *Bathing: Needs Assistance  *Shopping: Independent  *House Keeping: Needs Assistance  *Managing Own Medications: Independent  *Handling Finances: Independent  Difficulty doing errands due to a physicial, mental or emotional condition: Yes  Difficulty remembering or making decisions due to a physicial, mental or emotional condition: No      Fall Risk Screening Results:  Have you fallen in the last year?: Yes  Did you sustain an injury which required medical attention?: No  Do you feel you are at risk for falling?: No  Do you feel your risk of falling is: : Moderate  Would you like  to learn more about how to reduce your risk of falling?: No      Assessment and Plan:     Cognitive Function:  Recall of recent and remote events appears:  Normal      Advanced Care Planning:  was discussed and the paperwork can be found in the scanned media section     The following health maintenance plan was reviewed with the patient:    Health Maintenance Topics with due status: Overdue       Topic Date Due    HIV Screening USPSTF/[place] Never done    Colon Cancer Screening Other 02/15/2017    Diabetic Eye Exam ADA 10/01/2020     Health Maintenance Topics with due status: Due Soon       Topic Date Due    Diabetic A1C Monitoring ADA 10/24/2020    Diabetic Foot Exam ADA 10/24/2020     Health Maintenance Topics with due status: Not Due       Topic Last Completion Date    Cervical Cancer Screening USPSTF 11/03/2017    Diabetic Nephropathy Screening no ACE/ARB 04/25/2020    Breast Cancer Screening Other 08/22/2020    DEPRESSION SCREEN MONTHLY 10/12/2020     Health Maintenance Topics with due status: Completed       Topic Last Completion Date  Hepatitis C Screening USPSTF/[place] 11/01/2019    IMM-INFLUENZA 04/25/2020    COVID-19 Vaccine 04/25/2020     This health maintenance schedule, identified risks, a list of orders placed today and patient goals have been provided to Leata Mouse in the after visit summary.     Plan for any concerns identified during screening or risk assessments:  Referred for colonoscopy  Records for diabetic eye exam requested  DM foot exam completed  Declnies HIV    [name], MD  [facility]  10/12/2020  6:40 PM

## 2020-10-12 NOTE — Progress Notes (Signed)
[facility]          SUBJECTIVE    Pt is here to discuss:    Chief Complaint   Patient presents with    Subsequent Annual Medicare Visit         1. B/L hand numbness, white discoloration and cold to touch.  Started 1 mo ago. More frequent, now daily. Can last 3-4 min total. Nails can appear purple. Skin on hands are also dry. Also having cold intolerance.      2.  Abscess-left mons and left labial region.  Labial lesion is draining, but left mons lesion is growing in size and pain.  Denies fevers or chills.  Using doxycycline without effect.  History of hidradenitis suppurativa    3. DM - Adherent with medications.  Due for lab work  Lab Results   Component Value Date    HA1C 6.3 (H) 04/25/2020    HA1C 6.3 (H) 11/01/2019    HA1C 6.4 (H) 07/28/2019    MALBR <1.20 04/25/2020    CREAT 0.91 10/12/2020    LDLC 66 04/25/2020     4. Fibromyalgia-increased.  Attributes this to stressors with her wife who has multiple sclerosis, and recently having behavior/memory changes.         PMH / Family Hx / Social Hx  Patient's medications, allergies, problem list, past medical, social histories were reviewed and notable for:       Current Outpatient Medications   Medication Sig Note    cetirizine (ZYRTEC) 10 mg tablet Take 1 tablet (10 mg total) by mouth 2 times daily     fluticasone (FLONASE) 50 MCG/ACT nasal spray SPRAY 1 SPRAY IN EACH NOSTRIL DAILY     pantoprazole (PROTONIX) 40 mg EC tablet TAKE 1 TABLET BY MOUTH EVERY DAY, SWALLOW WHOLE. DO NOT BREAK, CHEW OR CRUSH     lamoTRIgine (LAMICTAL) 200 mg tablet Take 1 tablet (200 mg total) by mouth daily  Replacing 466ZL dose     TRULICITY 9.35 TS/1.7BL pen INJECT 0.75 MG SUBCUTANEOUSLY (UNDER THE SKIN) EVERY 7 DAYS     topiramate (TOPAMAX) 100 mg tablet TAKE 1 TABLET BY MOUTH TWO TIMES DAILY     insulin lispro 100 UNIT/ML injection vial Inject as directed via pump up to Maximum  60  Units/day     busPIRone (BUSPAR) 30 mg tablet TAKE 1 TABLET BY MOUTH TWO TIMES  DAILY     DULoxetine (CYMBALTA) 60 mg DR capsule TAKE 1 CAPSULE BY MOUTH EVERY DAY     dicyclomine (BENTYL) 10 mg capsule Take 1 capsule (10 mg total) by mouth 4 times daily (before meals and nightly)  TAKE 1 CAPSULE BY MOUTH FOUR TIMES DAILY BEFORE MEALS AND NIGHTLY     QUEtiapine (SEROQUEL XR) 50 mg 24 hr tablet Take 1 tablet (50 mg total) by mouth nightly  Swallow whole. Do not chew, crush, or break.     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     ascorbic acid (VITAMIN C) 100 MG tablet Take 5 tablets (500 mg total) by mouth daily     atorvastatin (  LIPITOR) 40 MG tablet Take 1 tablet (40 mg total) by mouth daily (with dinner)     metoprolol (TOPROL-XL) 25 MG 24 hr tablet Take by mouth daily    06/10/2016: Received from: External Pharmacy    midodrine (PROAMATINE) 10 MG tablet Take 1 tablet (10 mg total) by mouth 3 times daily     traMADol (ULTRAM) 50 mg tablet TAKE 1 TABLET BY MOUTH EVERY 8 HOURS AS NEEDED FOR HEAD AND ABDOMINAL PAIN MAXIMUM DAILY DOSE OF 3 PER DAY     famotidine (PEPCID) 20 mg tablet TAKE 1 TABLET BY MOUTH EVERY EVENING AS NEEDED FOR HEARTBURN     diphenoxylate-atropine (LOMOTIL) 2.5-0.025 MG per tablet Take 1-2 tablets by mouth 4 times daily as needed for Diarrhea  Max daily dose: 8 tablets Code D     albuterol HFA (PROVENTIL, VENTOLIN, PROAIR HFA) 108 (90 Base) MCG/ACT inhaler INHALE 1 TO 2 PUFFS BY MOUTH EVERY 6 HOURS AS NEEDED FOR WHEEXING. SHAKE WELL BEFORE EACH USE     Continuous Blood Gluc Receiver ([place]) By no specified route as needed  Use as directed to monitor blood glucose levels up to 10 x daily     Continuous Blood  Gluc Sensor (DEXCOM G6 SENSOR) Apply sensor as directed. Change every 10 days.     Continuous Blood Gluc Transmit (DEXCOM G6 TRANSMITTER) Attach to sensor. Change every 90 days     ascorbic acid (VITAMIN C) 500 MG tablet TAKE 1 TABLET BY MOUTH EVERY DAY     ALPRAZolam (XANAX) 1 mg tablet TAKE 1 TABLET BY MOUTH THREE TIMES WEEKLY AS NEEDED FOR ANXIETY MAXIMUM DAILY DOSE OF 1 PER DAY     benzonatate (TESSALON) 200 mg capsule Take 1 capsule (200 mg total) by mouth 3 times daily as needed for Cough     Alcohol Swabs (B-D SINGLE USE SWABS REGULAR) PADS USE 1 PAD TOPICALLY TWO TIMES DAILY TO CHECK BLOOD GLUCOSE     acetaminophen-codeine 120-12 MG/5ML solution Take 5 mLs by mouth every 6 hours as needed for Pain  Max daily dose: 20 mLs     triamcinolone (KENALOG) 0.025 % ointment Apply topically 2 times daily  to the following areas: L underarm     insulin lispro 100 UNIT/ML injection vial Inject as directed via pump up to Maximum  60  Units/day. Discard vial 28 days after first use.     SUMAtriptan (IMITREX) 50 mg tablet Take at onset of headache. May repeat once in 2 hours. TAKE 1 TABLET BY MOUTH AS NEEDED FOR MIGRAINE, TAKE AT ONSET OF HEADACHE, MAY REPEAT ONCE IN 2 HOURS     blood glucose test strip Test  8 times a day.   Brand name of strips Contour NEXT test strips for linked omnipod pump     clotrimazole (LOTRIMIN) 1 % cream Apply topically 2 times daily  for Skin Infection due to Candida Yeast     Insulin Disposable Pump (OMNIPOD DASH 5 PACK PODS) MISC By 1 device no specified route as needed Change every 2 days = 45 pods for 90 days supply = 9 (5packs)     promethazine (PHENERGAN) 25 MG tablet Take 1 tablet (25 mg total) by mouth every 4-6 hours as needed     triamcinolone (KENALOG) 0.5 % cream Apply topically 2 times daily to the following areas: arms     clindamycin (CLEOCIN T) 1 % external solution APPLY TOPICALLY TWO TIMES DAILY, CONTINUE UNTIL IMPROVEMENT IN CYSTS  Continuous Blood Gluc  Receiver ([place]) By no specified route as needed     benzoyl peroxide 10 % LIQD external liquid Apply topically 2 times daily to the following areas: under arms, under breast, groin     LIDOCAINE HCL URETHRAL/MUCOSAL 2 % jelly APPLY TOPICALLY THREE TIMES DAILY AS NEEDED FOR PAIN     insulin syringe-needle U-100 (BD INSULIN SYRINGE U/F) 31G X 5/16" 0.3 ML HALF-UNIT USE THREE TIMES DAILY AS INSTRUCTED     blood pressure monitor, automatic with arm cuff Use as directed to monitor blood pressure     incontinence supply disposable Order Description:  Wear as needed for stool leakage     zinc oxide 20 % ointment Apply topically as needed for Dry Skin     glucagon (BAQSIMI) 3 MG/DOSE nasal powder Inhale one dose (3 mg) into one nostril once as needed for low blood sugar. If no response in 15 min, inhale second dose from new device     lancets (BAYER MICROLET) Use   8  times per day as instructed for blood glucose testing.     azelastine (OPTIVAR) 0.05 % ophthalmic solution Place 1 drop into both eyes 2 times daily     SUMAtriptan refill (IMITREX STATDOSE) 6 MG/0.5ML injection INJECT 0.5ML UNDER THE SKIN ONCE AS NEEDED FOR MIGRAINE. MAY REPEAT DOSE ONCE AFTER 1 HOUR     glucagon (GLUCAGEN) 1 MG injection kit Inject 1 mg into the skin once as needed for Low blood sugar   Discard any unused portion.     Non-System Medication The above patient is followed in our clinic and cannot resume work permanently.        ROS  Wt Readings from Last 3 Encounters:   10/12/20 105.7 kg (233 lb)   07/06/20 106.1 kg (234 lb)   06/06/20 106.1 kg (233 lb 12.8 oz)     Persistent pill dysphagia, feels that pills get stuck on the right side  Denies pain or dizziness with overhead activities        OBJECTIVE  Vitals:    10/12/20 1144   BP: 126/70   BP Location: Left arm   Patient Position: Sitting   Pulse: 81   SpO2: 98%   Weight: 105.7 kg (233 lb)   Height: 1.702 m (5' 7.01")     Body mass index is 36.48  kg/m.      General: well-appearing Caucasian female, pleasant & conversant, in NAD  Eyes:. PERRLA. EOMI. Conjunctiva pink, without swelling or exudate. Lids normal.   ENT:   B/L TMs grey, without bulging or erythema.   Neck: Trachea midline. Thyroid nontender, without nodularity or enlargement.   Lymph: No cervical or supraclavicular lymphadenopathy.  Lungs: Normal resp effort. CTAB.  No crackles or wheezes.  Cardiac: RRR, no M/R/G. No pedal edema. Pulses 2+ b/l.   No carotid bruits  Ext: Radial pulses 1+ bilaterally.  Obvious dryness of hands along dorsal and palmar surfaces.  No other skin changes such as violaceous papules, thenar atrophy or plaques.  No dizziness or purple discoloration of face with hands held overhead for 1 minute.  MSK: Negative Phalen's and Tinnel's sign.  Grip strength, thumb opposition and finger abduction/abduction intact bilaterally.  Skin: Negative shawl sign. + Erythema of nasal bridge and bilateral cheeks.  Quarter sized indurated abscess without vesiculopustular head at left mons, minimal surrounding erythema  Psych: AAOx3, normal affect and mood. Insight and judgement intact.   Feet: No evidence of injury, infection  or ischemia b/l.  No dryness noted.  DP and TP pulses 2+ b/l. Sensation to monofilament testing is  intact.          ASSESSMENT & PLAN  1. Raynaud's disease  Given acute onset in warmer temperatures,, involvement of hands only, will get work-up for alternative causes including lupus and thyroid abnormality.  If normal labs, will consider EMG versus vascular testing  - TSH; Future  - Antinuclear antibody screen; Future     2. Cutaneous abscess, unspecified site  Recommended transition to Bactrim given severity of previous abscesses.  - sulfamethoxazole-trimethoprim (BACTRIM DS,SEPTRA DS) 800-160 mg tablet; Take 1 tablet by mouth every 12 hours for 7 days  for Infection of the Skin and/or Soft Tissue  Dispense: 14 tablet; Refill: 0    3. Type 2 diabetes mellitus with  complication  Reminded to update blood work as below  - Hemoglobin A1c; Future  - Comprehensive metabolic panel; Future  - Urinalysis with microscopic; Future  - CBC; Future  - Urinalysis with microscopic    4. Fibromyalgia  Patient declines medication change.  Reviewed strategies of communication with wife who was cognitive abilities may be declining in the setting of her multiple sclerosis.  - Pain clinic profile; Future  - Pain clinic profile    5. Preventative health care  AWV completed  - AMB REFERRAL TO GASTROENTEROLOGY        Follow-up: 6 months      [name], MD  [facility]  10/12/2020  11:54 AM        ______________________

## 2020-10-12 NOTE — Patient Instructions (Signed)
Thank you for completing your Subsequent Annual Medicare Visit   with [place] today.     The purpose of this visits was to:     Screen for disease   Assess risk of future medical problems   Help develop a healthy lifestyle   Update vaccines   Get to know your doctor in case of an illness    Patient Care Team:  [name], MD as PCP - General  Trilby Leaver, MD  Tresa Res, LCSW-R as Social Worker  Nordquist, Janett Billow, RN as Registered Nurse  Nada Libman, NP as Consulting Provider (Endocrinology)  Grayling Congress, MD as Consulting Provider (Physical Medicine and Rehabilitation)  Vicki Mallet, [place] (Ophthalmology)  Lovenia Kim as [place] - Internal     Medicare 5 Year Plan    The following items were identified as areas of concern during your screening today:  BMI greater than 25 - This is a risk for Heart Attack, Stroke, High Blood Pressure, Diabetes, High Cholesterol and other complications.       The Health Maintenance table below identifies screening tests and immunizations recommended by your health care team:  Health Maintenance: These screening recommendations are based on USPSTF, [employer], and [place] state guidelines   Topic Date Due    HIV Screening  Never done    Colon Cancer Screening  02/15/2017    Diabetic Eye Exam  10/01/2020    Diabetic A1C Monitoring  10/24/2020    Diabetic Foot Exam  10/24/2020    DEPRESSION SCREEN MONTHLY  11/11/2020    Diabetic Kidney Disease  04/25/2021    Breast Cancer Screening  08/23/2022    Cervical Cancer Screening   11/04/2022    Flu Shot  Completed    Hepatitis C Screening  Completed    COVID-19 Vaccine  Completed     In addition, goals and orders placed to address these recommendations are listed in the "Today's Visit" section.    We wish you the best of health and look forward to seeing you again next year for your Annual Medicare Wellness Visit.     If you have any health care concerns before then, please do not  hesitate to contact us.

## 2020-10-13 LAB — THC SEMI-QUANT, URINE
Creatinine,UR: 133 mg/dL (ref 20–300)
Semi-Quant THC,UR: 64 ng/mL
THC/Creat Ratio: 48 ng/mg

## 2020-10-13 LAB — ANTINUCLEAR ANTIBODY SCREEN: ANA Screen: NEGATIVE

## 2020-10-13 LAB — HEMOGLOBIN A1C: Hemoglobin A1C: 6 % — ABNORMAL HIGH

## 2020-10-15 ENCOUNTER — Encounter: Payer: Self-pay | Admitting: Primary Care

## 2020-10-15 DIAGNOSIS — I7300 Raynaud's syndrome without gangrene: Secondary | ICD-10-CM

## 2020-10-15 NOTE — Telephone Encounter (Signed)
Please help with [place] order    [name], MD  [facility]  10/15/2020   9:46 PM

## 2020-10-16 ENCOUNTER — Encounter: Payer: Self-pay | Admitting: Primary Care

## 2020-10-16 NOTE — Telephone Encounter (Signed)
I have sent the patient the information to schedule her appointment

## 2020-10-17 LAB — CONFIRM THC METABOLITE, URINE: Confirm THC Metab: POSITIVE

## 2020-10-24 ENCOUNTER — Telehealth: Payer: Self-pay | Admitting: Gastroenterology

## 2020-10-24 ENCOUNTER — Encounter: Payer: Self-pay | Admitting: Gastroenterology

## 2020-10-24 ENCOUNTER — Encounter: Payer: Self-pay | Admitting: Primary Care

## 2020-10-24 DIAGNOSIS — Z1211 Encounter for screening for malignant neoplasm of colon: Secondary | ICD-10-CM

## 2020-10-24 MED ORDER — SUPREP BOWEL PREP KIT 17.5-3.13-1.6 GM/177ML PO SOLN
ORAL | 0 refills | Status: AC
Start: 2020-10-24 — End: ?

## 2020-10-24 NOTE — Telephone Encounter (Signed)
Called patient about screening colonoscopy referral.  While reviewing screening colonoscopy questions, patient was asking about possibly having an EGD done at the same time.  She states that she has had this done with Dr. Verner Chol before and states that she has been getting pills stuck in her throat.  Do you want the patient to have an office visit first?  Or ok to schedule dual procedure?  Please advise.  Thank you

## 2020-10-24 NOTE — Telephone Encounter (Signed)
Please contact patient to schedule dual procedure (screening colonoscopy and EGD for dysphagia) with MAC and Suprep.  Ok to schedule dual procedure per Dr. Verner Chol.  Thank you    GI Pre-Procedure Screening  Are you exhibiting any symptoms such as change in bowel movements, bloody stool, or a recent positive FIT test? no  Have you ever had a colonoscopy before? yes  If yes, were there any issues with the procedure regarding sedation or not cleaning out well? no  Ht: 5'7"   Wt: 232lb  BMI: 36.3  Do you have a history of sleep apnea? no  Are you diabetic? yes  If yes, do you take oral diabetic medication?  Do you use insulin? Insulin pump, patient has been advised to consult with her endocrinologist on how to adjust pump settings for the procedure  Do you have any cardiac concerns or do you see a cardiologist?  Yes, POTS (Dr. Kerin Ransom)  Are you on a blood thinner such as Plavix, Xarelto, Eliquis, or Coumadin? no  Do you have any respiratory or breathing concerns such as uncontrolled asthma or COPD? no  Do you drink alcohol daily or use narcotic pain medication? Takes Tramadol  Do you have a history of kidney disease? no  Any concerns with chronic constipation? no  Do you require antibiotics prior to procedures? no  Do you have any other concerns you feel the physician should review before we schedule you for the procedure? no

## 2020-10-24 NOTE — Telephone Encounter (Signed)
Please create an order for Covid test and send Suprep to patient's pharmacy.  Thank you

## 2020-10-26 NOTE — Telephone Encounter (Signed)
Please provide patient Dr. Fabian November information, referral done 6/9 which has this correct provider.  Patient was given Nadine Counts information mistakenly.    [name], MD  [facility]  10/26/2020  8:14 AM

## 2020-10-26 NOTE — Telephone Encounter (Signed)
Spoke to the patient and informed her that the referral has been changed. Patient is all set.

## 2020-10-30 NOTE — Telephone Encounter (Signed)
I SPOKE WITH PATIENT AND SHE WAS UNDER THE IMPRESSION THAT DR Verner Chol WAS [facility] NOT [facility]. THIS PATIENT IS IN [place] AND WOULD LIKE TO STAY OUT THERE. THANKS Elia Nunley

## 2020-10-31 ENCOUNTER — Other Ambulatory Visit: Payer: Self-pay

## 2020-10-31 DIAGNOSIS — Z760 Encounter for issue of repeat prescription: Secondary | ICD-10-CM

## 2020-10-31 NOTE — Telephone Encounter (Signed)
Confidential Drug Report  Search Terms: Terilyn Sano, 1976-04-24   Search Date: 10/31/2020 15:28:48 PM   Searching on behalf of: [id number] - [name]   The Drug Utilization Report below displays all of the controlled substance prescriptions, if any, that your patient has filled in the last twelve months. The information displayed on this report is compiled from pharmacy submissions to the Department, and accurately reflects the information as submitted by the pharmacies.  This report was requested by: Denton Meek   Reference #: [id number]   Others' Prescriptions  Patient Name: Kaylee Harris   Birth Date: [birth year]   Address: [place], [place] [address]   Sex: Female   Rx Written Rx Dispensed Drug Quantity Days Supply Prescriber Name Prescriber [place] # Payment Method Dispenser   10/03/2020 10/09/2020 tramadol hcl 50 mg tablet  42 14 Darnelle Spangle [id number] [facility] [employer] #02   09/28/2020 10/04/2020 diphenoxylate-atropine 2.5-0.025 mg tablet  573 30 Reginold Agent [id number] [place]. #19   08/31/2020 09/05/2020 alprazolam 1 mg tablet  15 35 Reginold Agent [id number] [place]. #19   08/31/2020 09/04/2020 tramadol hcl 50 mg tablet  42 14 Reginold Agent [id number] [place]. #02   08/01/2020 08/01/2020 tramadol hcl 50 mg tablet  42 14 Reginold Agent [id number] [place]. #02   07/31/2020 07/31/2020 acetaminophen-codeine 120 mg-12 mg/5 ml solution  127m 6 MJohny Drilling GDarnell Level(MD) [place] #02   07/10/2020 07/10/2020 tramadol hcl 50 mg tablet  42 14 [place]eginold [id number] #02   06/12/2020 06/15/2020 alprazolam 1 mg tablet  15 35 [place]eginold [id number] #19   06/15/2020 06/15/2020 tramadol hcl 50 mg tablet  42 14 MJohny Drilling GDarnell Level(MD) [place] #02   05/07/2020 05/09/2020 tramadol hcl 50 mg tablet  42 14 MJohny Drilling GDarnell Level(MD) [place] #02   03/14/2020 03/16/2020 alprazolam 1 mg tablet  15 35 CReginold [id number] #19   02/22/2020 02/23/2020 tramadol hcl 50 mg tablet  42 14 CReginold [id number] #02   01/28/2020 02/02/2020 alprazolam 1 mg tablet  15 15 MJohny Drilling GDarnell Level(MD) [place] #19   12/15/2019 12/23/2019 tramadol hcl 50 mg tablet  42 14 BGraceann [id number] #02   12/08/2019 12/13/2019 tramadol hcl 50 mg tablet  42 14 BGraceann [id number] #02   11/11/2019 11/18/2019 tramadol hcl 50 mg tablet  42 14 BGraceann [id number] #19     * - Drugs marked with an asterisk are compound drugs. If the compound drug is made up of more than one controlled substance, then each controlled substance will be a separate row in the table.         Last office visit:   10/12/2020  Patients upcoming appointments:  Future Appointments   Date Time Provider [place]  11/23/2020  3:45 PM RIS, RR US1(RRUS1) ERU ERiver IMG   04/13/2021  2:00 PM MJohny Drilling MD CAF None   10/15/2021  2:00 PM MJohny Drilling MD CAF None       Recent Lab Values 10/12/2020 04/14/2019 01/04/2018 12/17/2017 10/17/2017 10/17/2017 03/31/2017   EXP DATE - 2019-08-04 9/20  02/02/2018 11-03-18 08-04-18 04/04/18   UREMK See Text - - - - - -   THCU POS - - - - - -       Recent Lab results:  GENERAL CHEMISTRY   Recent Labs     10/12/20  1255 04/25/20  1637   NA 142 140   K 4.2 4.5   CL 108 104   CO2 23 24   GAP 11 12   UN 13 10   CREAT 0.91 0.97*   GFRC  --  70   GFRB  --  81   GLU 74 79   CA 9.5 9.5      LIPID PROFILE   Recent Labs     04/25/20  1637   CHOL 122   TRIG 92   HDL 38*   LDLC 66      LIVER PROFILE   Recent Labs      10/12/20  1255 04/25/20  1637   ALT 19 25   AST 17 18   ALK 87 105   TB 0.3 0.4      DIABETES THYROID   Recent Labs     10/12/20  1255 04/25/20  1637   HA1C 6.0* 6.3*    Recent Labs     10/12/20  1255 04/25/20  1637   TSH 1.48 1.03         Pending/Orders Labs:  Lab Frequency Next Occurrence   US doppler arteries bilateral upper extremity Once 10/15/2020   COVID-19 PCR Once 10/24/2020

## 2020-11-03 ENCOUNTER — Encounter: Payer: Self-pay | Admitting: Dermatology

## 2020-11-03 ENCOUNTER — Encounter: Payer: Self-pay | Admitting: Psychiatry

## 2020-11-03 MED ORDER — CHLORHEXIDINE GLUCONATE 2 % EX LIQD *I*
CUTANEOUS | 11 refills | Status: AC
Start: 2020-11-03 — End: ?

## 2020-11-03 NOTE — Telephone Encounter (Signed)
Sent chlorhexidine

## 2020-11-05 ENCOUNTER — Other Ambulatory Visit: Payer: Self-pay | Admitting: Endocrinology

## 2020-11-05 MED ORDER — SKIN PREP WIPES MISC
1.0000 | 11 refills | Status: DC
Start: 2020-11-05 — End: 2020-11-23

## 2020-11-15 ENCOUNTER — Other Ambulatory Visit: Payer: Self-pay | Admitting: Primary Care

## 2020-11-15 DIAGNOSIS — Z9109 Other allergy status, other than to drugs and biological substances: Secondary | ICD-10-CM

## 2020-11-15 MED ORDER — CETIRIZINE HCL 10 MG PO TABS *I*
10.0000 mg | ORAL_TABLET | Freq: Two times a day (BID) | ORAL | 3 refills | Status: DC
Start: 2020-11-15 — End: 2020-12-13

## 2020-11-15 NOTE — Telephone Encounter (Signed)
Last office visit:   10/12/2020  Patients upcoming appointments:  Future Appointments   Date Time Provider [place]   11/23/2020  3:45 PM RIS, RR US1(RRUS1) ERU ERiver IMG   04/13/2021  2:00 PM [name], MD CAF None   10/15/2021  2:00 PM [name], MD CAF None       Recent Lab Values 10/12/2020 04/14/2019 01/04/2018 12/17/2017 10/17/2017 10/17/2017 03/31/2017   EXP DATE - 2019-08-04 9/20 02/02/2018 11-03-18 08-04-18 04/04/18   [place] See Text - - - - - -   THCU POS - - - - - -       Recent Lab results:  GENERAL CHEMISTRY   Recent Labs     10/12/20  1255 04/25/20  1637   NA 142 140   K 4.2 4.5   CL 108 104   CO2 23 24   GAP 11 12   UN 13 10   CREAT 0.91 0.97*   GFRC  --  70   GFRB  --  81   GLU 74 79   CA 9.5 9.5      LIPID PROFILE   Recent Labs     04/25/20  1637   CHOL 122   TRIG 92   HDL 38*   LDLC 66      LIVER PROFILE   Recent Labs     10/12/20  1255 04/25/20  1637   ALT 19 25   AST 17 18   ALK 87 105   TB 0.3 0.4      DIABETES THYROID   Recent Labs     10/12/20  1255 04/25/20  1637   HA1C 6.0* 6.3*    Recent Labs     10/12/20  1255 04/25/20  1637   TSH 1.48 1.03         Pending/Orders Labs:  Lab Frequency Next Occurrence   US doppler arteries bilateral upper extremity Once 10/15/2020   COVID-19 PCR Once 10/24/2020

## 2020-11-17 ENCOUNTER — Encounter: Payer: Self-pay | Admitting: Gastroenterology

## 2020-11-21 ENCOUNTER — Encounter: Payer: Self-pay | Admitting: Endocrinology

## 2020-11-21 DIAGNOSIS — E1165 Type 2 diabetes mellitus with hyperglycemia: Secondary | ICD-10-CM

## 2020-11-23 ENCOUNTER — Ambulatory Visit
Admission: RE | Admit: 2020-11-23 | Discharge: 2020-11-23 | Disposition: A | Discharge status: Home / Self Care | Payer: Medicare (Managed Care) | Source: Ambulatory Visit | Attending: Primary Care | Admitting: Primary Care

## 2020-11-23 DIAGNOSIS — R531 Weakness: Secondary | ICD-10-CM | POA: Insufficient documentation

## 2020-11-23 DIAGNOSIS — I7300 Raynaud's syndrome without gangrene: Secondary | ICD-10-CM | POA: Insufficient documentation

## 2020-11-24 ENCOUNTER — Encounter: Payer: Self-pay | Admitting: Gastroenterology

## 2020-11-24 ENCOUNTER — Encounter: Payer: Self-pay | Admitting: Primary Care

## 2020-11-26 ENCOUNTER — Other Ambulatory Visit: Payer: Self-pay

## 2020-11-26 ENCOUNTER — Other Ambulatory Visit: Payer: Self-pay | Admitting: Family Medicine

## 2020-11-26 DIAGNOSIS — Z760 Encounter for issue of repeat prescription: Secondary | ICD-10-CM

## 2020-11-26 MED ORDER — SKIN PREP WIPES MISC
1.0000 | 11 refills | Status: DC
Start: 2020-11-26 — End: 2020-11-26

## 2020-11-26 MED ORDER — SKIN PREP WIPES MISC
1.0000 | 11 refills | Status: AC
Start: 2020-11-26 — End: ?
  Filled 2020-11-26: qty 50, 30d supply, fill #0

## 2020-11-27 ENCOUNTER — Other Ambulatory Visit: Payer: Self-pay

## 2020-11-27 NOTE — Telephone Encounter (Signed)
Confidential Drug Report  Search Terms: Kaylee Harris, 1976/04/25   Search Date: 11/27/2020 08:36:03 AM   Searching on behalf of: [id number] - [name]   The Drug Utilization Report below displays all of the controlled substance prescriptions, if any, that your patient has filled in the last twelve months. The information displayed on this report is compiled from pharmacy submissions to the Department, and accurately reflects the information as submitted by the pharmacies.  This report was requested by: Denton Meek   Reference #: [id number]   Others' Prescriptions  Patient Name: Kaylee Harris   Birth Date: 26-Mar-1976   Address: [place], [place] [address]   Sex: Female   Rx Written Rx Dispensed Drug Quantity Days Supply Prescriber Name Prescriber [place] # Payment Method Dispenser   10/31/2020 11/01/2020 tramadol hcl 50 mg tablet  42 [place], [place] [id number] [place]. #02   10/03/2020 10/09/2020 tramadol hcl 50 mg tablet  42 14 Darnelle Spangle [id number] [employer] #02   09/28/2020 10/04/2020 diphenoxylate-atropine 2.5-0.025 mg tablet  010 30 Reginold Agent [id number] [place]. #19   08/31/2020 09/05/2020 alprazolam 1 mg tablet  15 35 Reginold Agent [id number] [place]. #19   08/31/2020 09/04/2020 tramadol hcl 50 mg tablet  42 14 Reginold Agent [id number] [place]. #02   08/01/2020 08/01/2020 tramadol hcl 50 mg tablet  42 14 Reginold Agent [id number] [place]. #02   07/31/2020 07/31/2020 acetaminophen-codeine 120 mg-12 mg/5 ml solution  11m 6 MJohny Drilling GDarnell Level(MD) [place] #02   07/10/2020 07/10/2020 tramadol hcl 50 mg tablet  42 14 CReginold [id number] #02   06/12/2020 06/15/2020 alprazolam 1 mg tablet  15 35 CReginold [id number] #19   06/15/2020 06/15/2020 tramadol hcl 50 mg tablet  42 14 MJohny Drilling GDarnell Level(MD) FB and E #02   05/07/2020 05/09/2020 tramadol hcl 50 mg tablet  42 14 MJohny Drilling GDarnell Level(MD) FVilla [place] #02   03/14/2020 03/16/2020 alprazolam 1 mg tablet  15 35 CReginold [id number] #19   02/22/2020 02/23/2020 tramadol hcl 50 mg tablet  42 14 [place]egin[place] [id number] #02   01/28/2020 02/02/2020 alprazolam 1 mg tablet  15 15 MJohny Drilling GDarnell Level(MD) FAliquippa #19   12/15/2019 12/23/2019 tramadol hcl 50 mg tablet  42 14 BGraceann [id number] and Peninsula #02   12/08/2019 12/13/2019 tramadol hcl 50 mg tablet  42 14 BGraceann [id number] #02     * - Drugs marked with an asterisk are compound drugs. If the compound drug is made up of more than one controlled substance, then each controlled substance will be a separate row in the table.         Last office visit:   10/12/2020  Patients upcoming appointments:  Future Appointments   Date Time Provider [place]  04/13/2021  2:00 PM MJohny Drilling MD [facility] None   10/15/2021  2:00 PM MJohny Drilling MD [facility] None       Recent Lab Values 10/12/2020 04/14/2019 01/04/2018 12/17/2017 10/17/2017 10/17/2017 03/31/2017   EXP DATE - 2019-08-04 9/20 02/02/2018 11-03-18 08-04-18 04/04/18   UREMK See Text - - - - - -  THCU POS - - - - - -       Recent Lab results:  GENERAL CHEMISTRY   Recent Labs     10/12/20  1255 04/25/20  1637   NA 142 140   K 4.2 4.5   CL 108 104   CO2 23 24   GAP 11 12   UN 13 10   CREAT 0.91 0.97*   GFRC  --  70   GFRB  --  81   GLU 74 79   CA 9.5 9.5      LIPID PROFILE   Recent Labs     04/25/20  1637   CHOL 122   TRIG 92   HDL 38*   LDLC 66      LIVER PROFILE   Recent Labs     10/12/20  1255 04/25/20  1637   ALT 19 25   AST 17 18    ALK 87 105   TB 0.3 0.4      DIABETES THYROID   Recent Labs     10/12/20  1255 04/25/20  1637   HA1C 6.0* 6.3*    Recent Labs     10/12/20  1255 04/25/20  1637   TSH 1.48 1.03         Pending/Orders Labs:  Lab Frequency Next Occurrence   COVID-19 PCR Once 10/24/2020

## 2020-11-28 ENCOUNTER — Encounter: Payer: Self-pay | Admitting: Gastroenterology

## 2020-11-28 ENCOUNTER — Other Ambulatory Visit: Payer: Self-pay

## 2020-11-29 ENCOUNTER — Encounter: Payer: Self-pay | Admitting: Registered Nurse

## 2020-11-30 ENCOUNTER — Encounter: Payer: Self-pay | Admitting: Gastroenterology

## 2020-12-01 ENCOUNTER — Encounter: Payer: Self-pay | Admitting: Gastroenterology

## 2020-12-05 ENCOUNTER — Other Ambulatory Visit: Payer: Self-pay

## 2020-12-05 ENCOUNTER — Telehealth: Payer: Self-pay | Admitting: Primary Care

## 2020-12-05 ENCOUNTER — Encounter: Payer: Self-pay | Admitting: Primary Care

## 2020-12-05 NOTE — Telephone Encounter (Signed)
Glenard Haring from [place] are called regarding to report that Jacques was approved for home care services which she is given 28 hours a week but only needs 5 hours a week.  Also reported 7/10 pain in her lower legs and patient has been taking her pain medication but hasn't really helped on taking a edge off for a little while.

## 2020-12-05 NOTE — Telephone Encounter (Signed)
noted 

## 2020-12-06 ENCOUNTER — Other Ambulatory Visit: Payer: Self-pay

## 2020-12-08 ENCOUNTER — Encounter: Payer: Self-pay | Admitting: Gastroenterology

## 2020-12-12 ENCOUNTER — Other Ambulatory Visit: Payer: Self-pay | Admitting: Family Medicine

## 2020-12-12 DIAGNOSIS — Z9109 Other allergy status, other than to drugs and biological substances: Secondary | ICD-10-CM

## 2020-12-13 ENCOUNTER — Other Ambulatory Visit: Payer: Self-pay

## 2020-12-13 DIAGNOSIS — J45909 Unspecified asthma, uncomplicated: Secondary | ICD-10-CM

## 2020-12-13 MED ORDER — FLUTICASONE PROPIONATE HFA 220 MCG/ACT IN AERO *I*
1.0000 | INHALATION_SPRAY | Freq: Every day | RESPIRATORY_TRACT | 2 refills | Status: DC
Start: 2020-12-13 — End: 2021-03-09

## 2020-12-13 NOTE — Telephone Encounter (Signed)
Search Terms: Shakira Los, March 13, 1976   Search Date: 12/13/2020 08:15:53 AM   Searching on behalf of: [id number] - [name]   The Drug Utilization Report below displays all of the controlled substance prescriptions, if any, that your patient has filled in the last twelve months. The information displayed on this report is compiled from pharmacy submissions to the Department, and accurately reflects the information as submitted by the pharmacies.  This report was requested by: Phineas Real   Reference #: [id number]   Others' Prescriptions  Patient Name: Kaylee Harris   Birth Date: 13-May-1975   Address: [place], [place] [address]   Sex: Female   Rx Written Rx Dispensed Drug Quantity Days Supply Prescriber Name Prescriber [place] # Payment Method Dispenser   11/27/2020 12/01/2020 tramadol hcl 50 mg tablet  42 [place], [place] [id number] [place]. #02   10/31/2020 11/01/2020 tramadol hcl 50 mg tablet  42 14 Reginold Agent [id number] [place]. #02   10/03/2020 10/09/2020 tramadol hcl 50 mg tablet  42 14 Darnelle Spangle [id number] [employer] #02   09/28/2020 10/04/2020 diphenoxylate-atropine 2.5-0.025 mg tablet  607 30 Reginold Agent [id number] [place]. #19   08/31/2020 09/05/2020 alprazolam 1 mg tablet  15 35 Reginold Agent [id number] [place]. #19   08/31/2020 09/04/2020 tramadol hcl 50 mg tablet  42 14 Reginold Agent [id number] [place]. #02   08/01/2020 08/01/2020 tramadol hcl 50 mg tablet  42 14 Reginold Agent [id number] [place]. #02   07/31/2020 07/31/2020 acetaminophen-codeine 120 mg-12 mg/5 ml solution  159m 6 MJohny Drilling GDarnell Level(MD) FHouston #02   07/10/2020 07/10/2020 tramadol hcl 50 mg tablet  42 14 CReginold [id number] #02   06/12/2020  06/15/2020 alprazolam 1 mg tablet  15 35 CReginold [id number] #19   06/15/2020 06/15/2020 tramadol hcl 50 mg tablet  42 14 MJohny Drilling GDarnell Level(MD) FSim[place] #02   05/07/2020 05/09/2020 tramadol hcl 50 mg tablet  42 14 MJohny Drilling GDarnell Level(MD) FClyde #02   03/14/2020 03/16/2020 alprazolam 1 mg tablet  15 35 CReginold [id number] #19   02/22/2020 02/23/2020 tramadol hcl 50 mg tablet  42 14 CReginold [id number] #02   01/28/2020 02/02/2020 alprazolam 1 mg tablet  15 15 MJohny Drilling GDarnell Level(MD) FWalker #19   12/15/2019 12/23/2019 tramadol hcl 50 mg tablet  4[place]14 BGraceann [id number] #02          Last office visit:   10/12/2020  Patients upcoming appointments:  Future Appointments   Date Time Provider DH[place]  04/13/2021  2:00 PM MJohny Drilling MD [facility] None   10/15/2021  2:00 PM MJohny Drilling MD [facility] None       Recent Lab Values 10/12/2020 04/14/2019 01/04/2018 12/17/2017 10/17/2017 10/17/2017 03/31/2017   EXP DATE - 2019-08-04 9/20 02/02/2018 11-03-18 08-04-18 04/04/18   UREMK See Text - - - - - -   THCU POS - - - - - -       Recent Lab results:  GENERAL CHEMISTRY   Recent Labs     10/12/20  1255 04/25/20  1637   NA 142  140   K 4.2 4.5   CL 108 104   CO2 23 24   GAP 11 12   UN 13 10   CREAT 0.91 0.97*   GFRC  --  70   GFRB  --  81   GLU 74 79   CA 9.5 9.5      LIPID PROFILE   Recent Labs     04/25/20  1637   CHOL 122   TRIG 92   HDL 38*   LDLC 66      LIVER PROFILE   Recent Labs     10/12/20  1255 04/25/20  1637   ALT 19 25   AST 17 18   ALK 87 105   TB 0.3 0.4      DIABETES THYROID   Recent Labs     10/12/20  1255 04/25/20  1637   HA1C 6.0* 6.3*    Recent Labs     10/12/20  1255 04/25/20  1637   TSH 1.48 1.03         Pending/Orders Labs:  Lab  Frequency Next Occurrence   COVID-19 PCR Once 10/24/2020

## 2020-12-13 NOTE — Telephone Encounter (Signed)
Last office visit:   10/12/2020  Patients upcoming appointments:  Future Appointments   Date Time Provider [place]   04/13/2021  2:00 PM [name], MD [facility] None   10/15/2021  2:00 PM [name], MD [facility] None       Recent Lab Values 10/12/2020 04/14/2019 01/04/2018 12/17/2017 10/17/2017 10/17/2017 03/31/2017   EXP DATE - 2019-08-04 9/20 02/02/2018 11-03-18 08-04-18 04/04/18   UREMK See Text - - - - - -   THCU POS - - - - - -       Recent Lab results:  GENERAL CHEMISTRY   Recent Labs     10/12/20  1255 04/25/20  1637   NA 142 140   K 4.2 4.5   CL 108 104   CO2 23 24   GAP 11 12   UN 13 10   CREAT 0.91 0.97*   GFRC  --  70   GFRB  --  81   GLU 74 79   CA 9.5 9.5      LIPID PROFILE   Recent Labs     04/25/20  1637   CHOL 122   TRIG 92   HDL 38*   LDLC 66      LIVER PROFILE   Recent Labs     10/12/20  1255 04/25/20  1637   ALT 19 25   AST 17 18   ALK 87 105   TB 0.3 0.4      DIABETES THYROID   Recent Labs     10/12/20  1255 04/25/20  1637   HA1C 6.0* 6.3*    Recent Labs     10/12/20  1255 04/25/20  1637   TSH 1.48 1.03         Pending/Orders Labs:  Lab Frequency Next Occurrence   COVID-19 PCR Once 10/24/2020

## 2020-12-15 ENCOUNTER — Other Ambulatory Visit: Payer: Self-pay | Admitting: Gastroenterology

## 2020-12-17 ENCOUNTER — Other Ambulatory Visit: Payer: Self-pay

## 2020-12-17 NOTE — Progress Notes (Signed)
[facility]  Consult Note                                  Primary Care Physician:   [name], MD   Patient Name: Kaylee Harris  12/17/2020    Consult reason: dysphagia, CRCS     Subjective:      Chart reviewed. History obtained from patient and chart review    Kaylee Harris is 45 y.o. year old female who presents for dysphagia.     She has a PMH of GERD, POTS, fibromyalgia, DM type II, migraines.  Over the past few months she has been noticing and occasionally some breads.  It feels like they get stuck on the right side of her throat in the lower neck area.  She notes that she also has hiccups often after eating bread.  She denies any heartburn at present, if she has particular problematic foods she will occasionally develop heartburn.  She is taking pantoprazole once daily in the morning and famotidine once daily in the evening as needed 20 mg.    Bowel movements remain stable.  She is trying to follow a healthy diet.  She does continue to have watery diarrhea.  She is taking dicyclomine 3 times daily and Lomotil 3 times daily which does help keep things stable.  She denies any hematochezia or melena.  She denies any unintentional weight loss, though has been working on weight loss intentionally.    EGD 2018-normal exam.  Colonoscopy 2017-normal exam    Medications:   Home Medications:  Prior to Admission medications    Medication Sig Start Date End Date Taking? Authorizing Provider   ALPRAZolam Duanne Moron) 1 mg tablet TAKE 1 TABLET BY MOUTH THREE TIMES WEEKLY AS NEEDED MAXIMUM DAILY DOSE OF 1 PER DAY 05/07/56   Reginold Agent, NP   cetirizine (ZYRTEC) 10 mg tablet TAKE 1 TABLET BY MOUTH TWO TIMES DAILY 09/29/76   Reginold Agent, NP   fluticasone (FLOVENT HFA) 220 MCG/ACT inhaler Inhale 1 puff into the lungs daily  Shake well before each use. 12/13/20   [name], MD   traMADol (ULTRAM) 50 mg tablet TAKE 1 TABLET BY MOUTH EVERY 8 HOURS AS NEEDED FOR HEAD AND ABDOMINAL PAIN . MAXIMUM  DAILY DOSE OF 3 PER DAY 2/42/35   Reginold Agent, NP   Ostomy Supplies (SKIN PREP WIPES) MISC Use topically as directed 11/26/20   Gae Dry, NP   chlorhexidine 2 % LIQD Use to wash axillae once daily 11/03/20   Earl Gala, MD   Na Sulfate-K Sulfate-Mg Sulf (SUPREP BOWEL PREP KIT) 17.5-3.13-1.6 GM/177ML SOLN Take first dose at 6pm the day prior to colonoscopy and the second dose 6 hours prior to colonoscopy 10/24/20   Phillips Odor, MD   famotidine (PEPCID) 20 mg tablet TAKE 1 TABLET BY MOUTH EVERY EVENING AS NEEDED FOR HEARTBURN 3/61/44   Reginold Agent, NP   diphenoxylate-atropine (LOMOTIL) 2.5-0.025 MG per tablet Take 1-2 tablets by mouth 4 times daily as needed for Diarrhea  Max daily dose: 8 tablets Code D 07/19/38   Costello, Danton Clap, NP   albuterol HFA (PROVENTIL, VENTOLIN, PROAIR HFA) 108 (90 Base) MCG/ACT inhaler INHALE 1 TO 2 PUFFS BY MOUTH EVERY 6 HOURS AS NEEDED FOR WHEEXING. [place] USE 0/86/76   Reginold Agent, NP   Continuous Blood Gluc Receiver ([place]) By no specified route as needed  Use as directed to monitor blood glucose levels up to 10 x daily 09/26/20   Nada Libman, NP   Continuous Blood Gluc Sensor (DEXCOM G6 SENSOR) Apply sensor as directed. Change every 10 days. 09/26/20   Nada Libman, NP   Continuous Blood Gluc Transmit (DEXCOM G6 TRANSMITTER) Attach to sensor. Change every 90 days 09/26/20   Nada Libman, NP   fluticasone [facility]) 50 MCG/ACT nasal spray SPRAY 1 SPRAY IN [facility] NOSTRIL DAILY 09/01/20   Marquis Buggy, PA   pantoprazole (PROTONIX) 40 mg EC tablet TAKE 1 TABLET BY MOUTH EVERY DAY, SWALLOW WHOLE. DO NOT BREAK, CHEW OR CRUSH 09/01/20   Marquis Buggy, PA   ascorbic acid (VITAMIN C) 500 MG tablet TAKE 1 TABLET BY MOUTH EVERY DAY 09/01/20   Marquis Buggy, PA   lamoTRIgine (LAMICTAL) 200 mg tablet Take 1 tablet (200 mg total) by mouth daily  Replacing $RemoveBe'150mg'tGvLWTJuz$  dose 10/01/39   Reginold Agent, NP   benzonatate  (TESSALON) 200 mg capsule Take 1 capsule (200 mg total) by mouth 3 times daily as needed for Cough 08/01/20   [name], MD   Alcohol Swabs (B-D SINGLE USE SWABS REGULAR) PADS USE 1 PAD TOPICALLY TWO TIMES DAILY TO CHECK BLOOD GLUCOSE 07/28/38   Reginold Agent, NP   acetaminophen-codeine 120-12 MG/5ML solution Take 5 mLs by mouth every 6 hours as needed for Pain  Max daily dose: 20 mLs 07/31/20   [name], MD   triamcinolone (KENALOG) 0.025 % ointment Apply topically 2 times daily  to the following areas: L underarm 07/28/20   [name], MD   insulin lispro 100 UNIT/ML injection vial Inject as directed via pump up to Maximum  60  Units/day. Discard vial 28 days after first use. 07/27/20   Nada Libman, NP   SUMAtriptan (IMITREX) 50 mg tablet Take at onset of headache. May repeat once in 2 hours. TAKE 1 TABLET BY MOUTH AS NEEDED FOR MIGRAINE, TAKE AT ONSET OF HEADACHE, MAY REPEAT ONCE IN 2 HOURS 05/07/70   Reginold Agent, NP   TRULICITY 5.36 UY/4.0HK pen INJECT 0.75 MG SUBCUTANEOUSLY (UNDER THE SKIN) EVERY 7 DAYS 07/21/20   Nada Libman, NP   topiramate (TOPAMAX) 100 mg tablet TAKE 1 TABLET BY MOUTH TWO TIMES DAILY 07/18/20   [name], MD   insulin lispro 100 UNIT/ML injection vial Inject as directed via pump up to Maximum  60  Units/day 07/18/20   Nada Libman, NP   busPIRone (BUSPAR) 30 mg tablet TAKE 1 TABLET BY MOUTH TWO TIMES DAILY 11/07/23   Reginold Agent, NP   DULoxetine (CYMBALTA) 60 mg DR capsule TAKE 1 CAPSULE BY MOUTH EVERY DAY 9/56/38   Reginold Agent, NP   dicyclomine (BENTYL) 10 mg capsule Take 1 capsule (10 mg total) by mouth 4 times daily (before meals and nightly)  TAKE 1 CAPSULE BY MOUTH FOUR TIMES DAILY BEFORE MEALS AND NIGHTLY 7/56/43   Reginold Agent, NP   blood glucose test strip Test  8 times a day.   Brand name of strips Contour NEXT test strips for linked omnipod pump 04/21/20   Nada Libman, NP   QUEtiapine (SEROQUEL XR) 50 mg 24 hr tablet Take 1  tablet (50 mg total) by mouth nightly  Swallow whole. Do not chew, crush, or break. 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 04/06/20   [name], MD   clotrimazole (LOTRIMIN) 1 % cream Apply topically 2 times daily  for Skin Infection due to Candida Yeast 04/06/20   [name], MD   doxycycline hyclate (VIBRA-TABS) 100 mg tablet TAKE 1 TABLET BY MOUTH EVERY 12 HOURS AS NEEDED FOR 7 DAYS 03/20/20   Barrolle, Park Pope, NP   Insulin Disposable Pump (OMNIPOD DASH 5 PACK PODS) MISC By 1 device no specified route as needed Change every 2 days = 45 pods for 90 days supply = 9 (5packs) 12/15/19   Nada Libman, NP   promethazine (PHENERGAN) 25 MG tablet Take 1 tablet (25 mg total) by mouth every 4-6 hours as needed 11/10/19   Daine Floras, NP   triamcinolone (KENALOG) 0.5 % cream Apply topically 2 times daily to the following areas: arms 11/10/19   [name], MD   clindamycin (CLEOCIN T) 1 % external solution APPLY TOPICALLY TWO TIMES DAILY, CONTINUE UNTIL IMPROVEMENT IN CYSTS 10/18/19   Elliot Gault, MD   ascorbic acid (VITAMIN C) 100 MG tablet Take 5 tablets (500 mg total) by mouth daily 01/03/50   Reginold Agent, NP   Continuous Blood Gluc Receiver (DEXCOM G6 RECEIVER) By no specified route as needed 08/24/19   Nada Libman, NP   benzoyl peroxide 10 % LIQD external liquid Apply topically 2 times daily to the following areas: under arms, under breast, groin 07/01/19   Philis Pique, NP   LIDOCAINE HCL URETHRAL/MUCOSAL 2 % jelly APPLY TOPICALLY THREE TIMES DAILY AS NEEDED FOR PAIN 06/28/19   Elliot Gault, MD   insulin syringe-needle U-100 (BD INSULIN SYRINGE U/F) 31G X 5/16" 0.3 ML HALF-UNIT USE THREE TIMES DAILY AS INSTRUCTED 02/26/19   Graceann Congress L, NP   blood pressure monitor, automatic with arm cuff Use as directed to monitor  blood pressure 11/25/18   [name], MD   incontinence supply disposable Order Description:  Wear as needed for stool leakage 09/22/18   Daine Floras, NP   zinc oxide 20 % ointment Apply topically as needed for Dry Skin 08/24/18   Verita Lamb, NP   glucagon (BAQSIMI) 3 MG/DOSE nasal powder Inhale one dose (3 mg) into one nostril once as needed for low blood sugar. If no response in 15 min, inhale second dose from new device 04/21/18   Nada Libman, NP   lancets [facility]) Use   8  times per day as instructed for blood glucose testing. 11/10/17   Nada Libman, NP   azelastine (OPTIVAR) 0.05 % ophthalmic solution Place 1 drop into both eyes 2 times daily    [provider]   SUMAtriptan refill (IMITREX STATDOSE) 6 MG/0.5ML injection INJECT 0.5ML UNDER THE SKIN ONCE AS NEEDED FOR MIGRAINE. MAY REPEAT DOSE ONCE AFTER 1 HOUR 09/25/17   Verita Lamb, NP   glucagon (GLUCAGEN) 1 MG injection kit Inject 1 mg into the skin once as needed for Low blood sugar   Discard any unused portion. 08/13/17   Nada Libman, NP   atorvastatin (LIPITOR) 40 MG tablet Take 1 tablet (40 mg total) by mouth daily (with dinner) 11/08/16   [name], MD   metoprolol (TOPROL-XL) 25 MG 24 hr tablet Take by mouth daily    05/24/16   [provider]   midodrine (PROAMATINE) 10 MG tablet Take 1 tablet (10 mg total) by mouth 3 times daily 01/12/16   Rodriguez-Lindsay, Otho Darner, MD   Non-System Medication The above patient is followed  in our clinic and cannot resume work permanently. 02/15/13   Lance Bosch, NP     Family history:     Family History   Problem Relation Age of Onset    Hypertension Father     Diabetes Father     Kidney Disease Father     Hyperlipidemia Father     Heart attack Father 49    Other Father         PVD    Heart Disease Father     Hypertension Mother     Hyperlipidemia Mother     Heart attack Mother 67    Diabetes Mother     Eczema Mother     Psoriasis Mother      Heart Disease Mother     Hypertension Brother     Heart Disease Brother 57        prinzmetal's angina    Heart Disease Sister         currently having work up    [place] Sister     Hypertension Sister     Breast cancer Maternal Grandmother     Stroke Other     Thyroid disease Sister     Thyroid cancer Sister     Anesthesia problems Neg Hx      PMH:     Past Medical History:   Diagnosis Date    Abscess of abdominal wall 01/18/2014    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 Lower abdomen with cellulitis changes, wound probed and purulent material expressed.  Had PICC line for "multiple infiltrations" of what? D/c-ed home on 10d of Augmentin 9/11.      Anginal pain     Anxiety     Arthritis     Asthma     Depression     bipolar    Diabetes mellitus     Previously on SU and metformin, now diet controlled    Diverticulitis 09/2010    Dysfunctional uterine bleeding     Fibromyalgia     GERD (gastroesophageal reflux disease)     GERD (gastroesophageal reflux disease) 10/04/2016    High blood pressure     Hyperlipidemia     Liver disease     fatty liver     Long term (current) use of insulin, Dermal Adhesed V-Go Insulin Delivery Device 10/04/2016    Migraine     Neuromuscular disorder     POTS (postural orthostatic tachycardia syndrome)     Sebaceous cyst of breast     right axilla    TMJ click, left 10/07/4032    Varicella       Past Surgical History:   Procedure Laterality Date    APPENDECTOMY  2016    arthroscopic shoulder surgery Right 2010    Related to lifting    CARDIAC CATHETERIZATION  09/2011    negative    COLON SURGERY      COSMETIC SURGERY Bilateral 12/12/2017    B/L internal maxillary arteries embolized due to recurrent epistaxis, Dr Linard Millers    DILATION AND CURETTAGE OF UTERUS  2005, 2007    x2 for menorraghia    LEFT COLECTOMY  01/03/14    Dr Tresa Res    loop recorder  Feb 03 2014    loop recorder removal      PR COLONOSCOPY THRU COLOTOMY N/A  02/16/2016    Procedure: COLONOSCOPY;  Surgeon: Kelly Splinter, MD;  Location: [facility];  Service: GI  PR CYSTOURETHROSCOPY,BIOPSY N/A 10/17/2016    Procedure: CYSTOSCOPY BLADDER ;  Surgeon: Ed Blalock, MD;  Location: [facility];  Service: Urology    PR EDG TRANSORAL BIOPSY SINGLE/MULTIPLE N/A 10/29/2016    Procedure: EGD;  Surgeon: Kelly Splinter, MD;  Location: [facility];  Service: GI    TONSILLECTOMY       Social history:     Social History     Tobacco Use    Smoking status: Former Smoker     Packs/day: 0.50     Years: 3.00     Pack years: 1.50     Types: Cigarettes    Smokeless tobacco: Never Used    Tobacco comment: quit age late 40s   Substance Use Topics    Alcohol use: Yes     Alcohol/week: 0.0 standard drinks     Comment: 0-1 drink/ month     Allergies:     Allergies   Allergen Reactions    Morphine Itching    Trazodone Anaphylaxis    Seasonal Allergies Itching    Lidocaine Rash     Patches caused a rash    Nsaids Other (See Comments)     Bleeding/epistaxis    Other [Other] Other (See Comments)     Derma Bond(skin glue) pruritis/redness and c/o respiratory distress.   Received name: Other     Review of Systems:   General:  No malaise, fatigue, fever, chills, sweats.  HEENT:  No tinnitus, coryza, diplopia, dysgeusia.  Cardiovascular:  No chest pain, palpitations.  Respiratory:  No dyspnea, cough.  Musculoskeletal:  No myalgias.  GI:  Refer to above.  GU:  No dysuria, hematuria.  Skin:  No rash, pruritus, jaundice.  Neuro:   No focal numbness, weakness, tremor.  Psychiatric:  No somnolence, confusion, dysphoria.  Endocrine:  No heat or cold intolerance.  Heme/Lymph:  No easy bruising/bleeding.  No concerning lumps.  Allergy/Rheum:  No arthralgias/arthritis.  No rash/hives.  All other systems negative.  PHYSICAL EXAM:  Temp Readings from Last 3 Encounters:   07/31/20 36.4 C (97.6 F)   04/25/20 36.8 C (98.2 F)   03/09/20 36.2 C (97.1 F)     BP Readings  from Last 3 Encounters:   10/12/20 126/70   06/06/20 98/62   05/30/20 108/70     Pulse Readings from Last 3 Encounters:   10/12/20 81   07/31/20 85   04/25/20 85       Vital Signs: BP: 107/73 Ht: 67" 5'7" Wt: 227lb Wt Prior: 287lb as of 08/13/16 Wt Dif: -60lb BMI: 35.5 Pulse: 66 98o2 T: 97.6  Wt Readings from Last 3 Encounters:   10/12/20 105.7 kg (233 lb)   07/06/20 106.1 kg (234 lb)   06/06/20 106.1 kg (233 lb 12.8 oz)       General appearance: alert, appears stated age and cooperative  Head: Normocephalic, without obvious abnormality, atraumatic  Eyes: conjunctivae/corneas clear, anicteric  Neck: supple, symmetrical, trachea midline  Lungs: clear to auscultation bilaterally  Heart: regular rate and rhythm, S1, S2 normal, no murmur, click, rub or gallop  Abdomen: soft, non-tender; bowel sounds normal; no masses,  no organomegaly  Extremities: extremities normal, atraumatic, no cyanosis or edema  Neurologic: Grossly normal  Rectal: Deferred at this time  MSK: Grossly Normal  Lymph: No appreciable lymphadenopathy    LAB DATA:     Lab Results:    _0 @   Lab Results   Component Value Date    WBC 10.0  10/12/2020    HGB 13.5 10/12/2020    HCT 42 10/12/2020    MCV 92 10/12/2020    PLT 347 10/12/2020          Lab results: 10/12/20  1255 04/25/20  1637   Sodium 142 140   Potassium 4.2 4.5   Chloride 108 104   CO2 23 24   UN 13 10   Creatinine 0.91 0.97*   GFR,Caucasian  --  70   GFR,Black  --  81   Glucose 74 79   Calcium 9.5 9.5        Lab Results   Component Value Date    ALT 19 10/12/2020    AST 17 10/12/2020     Lab Results   Component Value Date    TB 0.3 10/12/2020      Lab Results   Component Value Date    LIP 44 04/14/2019    AMY 54 04/14/2019      No results for input(s): PTI, INR in the last 8760 hours.       IMAGING:   No results found.    ASSESSMENT:     45 y.o. year old female with dysphagia, GERD, diarrhea & due for CRCS.     PLAN:      Dysphagia, unspecified   45 yo woman with hx of GERD who  presents with some increase in GERD symptoms & dysphagia. She is on Pantoprazole 40 mg daily, will increase Famotidine to 40 mg at bedtime regularly scheduled.  DDx-esophagitis, Barrett's, malignancy, GERD, gastritis, H. pylori, PUD, esophageal stricture.    1. GERD diet   2. Avoid NSAID medications  3. Avoid eating/drinking anything for at least 3 hours before laying down at night   4. Continue Pantoprazole 40 mg daily in the morning   5. Increase Famotidine to 40 mg at bedtime   6. EGD   7. We will request medication instructions from endo for procedures  8. Cards clearance for procedures (Dr.Ritter)    The risks, benefits, and alternatives to endoscopy were discussed with the patient.  The risks of endoscopy including but not limited to the effects of sedation, throat irritation, infection, bleeding, and perforation were discussed with the patient. All questions were answered. The patient wishes to proceed and will schedule this at the earliest convenience.   Med New        :  Famotidine 40 mg 1 tablet by mouth at bedtime    Lab Orders     :  Covid-19 PCR  Order          :  Upper Endoscopy     Diarrhea, unspecified   45 year old woman with chronic loose diarrhea.  She continues on Lomotil and dicyclomine.  Symptoms are stable.  We will proceed with colonoscopy given her age for screening purposes.    1. Colonoscopy at [facility]    The risks, benefits, and alternatives to colonoscopy were discussed with the patient.  The risks of colonoscopy including but not limited to the effects of sedation, infection, bleeding, splenic injury and colon perforation were discussed with the patient. All questions were answered. The patient wishes to proceed and will schedule this at the earliest convenience.           Thank you for allowing me to participate in this patient's care.  Please do not hesistate to contact us with any questions or concerns at: Office #: [phone] or Office Fax: [phone]     Electronically Signed  by:   Doug Sou, PA  Note created: 12/17/2020  at: 9:26 PM

## 2020-12-19 ENCOUNTER — Encounter: Payer: Self-pay | Admitting: Gastroenterology

## 2020-12-22 ENCOUNTER — Encounter: Payer: Self-pay | Admitting: Gastroenterology

## 2020-12-22 ENCOUNTER — Telehealth: Payer: Self-pay

## 2020-12-22 NOTE — Telephone Encounter (Addendum)
We received a fax for Home care to be sign by the doctor. The fax has been place in the providers bin to be signed.

## 2020-12-25 NOTE — Telephone Encounter (Signed)
The form has been completed by Dr. Madilyn Hook. I have faxed the form to [facility] at [phone] then placed in scanning.

## 2020-12-26 ENCOUNTER — Other Ambulatory Visit: Payer: Self-pay | Admitting: Family Medicine

## 2020-12-26 ENCOUNTER — Other Ambulatory Visit: Payer: Self-pay | Admitting: Psychiatry

## 2020-12-26 DIAGNOSIS — E1165 Type 2 diabetes mellitus with hyperglycemia: Secondary | ICD-10-CM

## 2020-12-26 DIAGNOSIS — Z760 Encounter for issue of repeat prescription: Secondary | ICD-10-CM

## 2020-12-26 MED ORDER — OMNIPOD DASH PODS (GEN 4) MISC
1.0000 | 3 refills | Status: DC | PRN
Start: 2020-12-26 — End: 2021-03-19

## 2020-12-26 NOTE — Telephone Encounter (Signed)
Last office visit:   10/12/2020  Patients upcoming appointments:  Future Appointments   Date Time Provider [place]   04/13/2021  2:00 PM [name], MD [facility] None   10/15/2021  2:00 PM [name], MD [facility] None       Recent Lab Values 10/12/2020 04/14/2019 01/04/2018 12/17/2017 10/17/2017 10/17/2017 03/31/2017   EXP DATE - 2019-08-04 9/20 02/02/2018 11-03-18 08-04-18 04/04/18   UREMK See Text - - - - - -   THCU POS - - - - - -       Recent Lab results:  GENERAL CHEMISTRY   Recent Labs     10/12/20  1255 04/25/20  1637   NA 142 140   K 4.2 4.5   CL 108 104   CO2 23 24   GAP 11 12   UN 13 10   CREAT 0.91 0.97*   GFRC  --  70   GFRB  --  81   GLU 74 79   CA 9.5 9.5      LIPID PROFILE   Recent Labs     04/25/20  1637   CHOL 122   TRIG 92   HDL 38*   LDLC 66      LIVER PROFILE   Recent Labs     10/12/20  1255 04/25/20  1637   ALT 19 25   AST 17 18   ALK 87 105   TB 0.3 0.4      DIABETES THYROID   Recent Labs     10/12/20  1255 04/25/20  1637   HA1C 6.0* 6.3*    Recent Labs     10/12/20  1255 04/25/20  1637   TSH 1.48 1.03         Pending/Orders Labs:  Lab Frequency Next Occurrence   COVID-19 PCR Once 10/24/2020      Confidential Drug Report  Search Terms: Harjit Douds, 1975/08/25   Search Date: 12/26/2020 14:40:44 PM   Searching on behalf of: [id number] - [name]   The Drug Utilization Report below displays all of the controlled substance prescriptions, if any, that your patient has filled in the last twelve months. The information displayed on this report is compiled from pharmacy submissions to the Department, and accurately reflects the information as submitted by the pharmacies.  This report was requested by: Jearld Adjutant   Reference #: [id number]   Others' Prescriptions  Patient Name: Kaylee Harris   Birth Date: 09-Aug-1975   Address: [place], [place] [address]   Sex: Female   Rx Written Rx Dispensed Drug Quantity Days Supply Prescriber Name Prescriber [place] # Payment Method Dispenser   12/13/2020  12/19/2020 alprazolam 1 mg tablet  15 35 Reginold Agent [id number] [facility]. #19   11/27/2020 12/01/2020 tramadol hcl 50 mg tablet  42 14 Reginold Agent [id number] [facility]. #02   10/31/2020 11/01/2020 tramadol hcl 50 mg tablet  42 14 Reginold Agent [id number] [facility]. #02   10/03/2020 10/09/2020 tramadol hcl 50 mg tablet  42 14 Darnelle Spangle [id number] [facility] #02   09/28/2020 10/04/2020 diphenoxylate-atropine 2.5-0.025 mg tablet  007 30 Reginold Agent [id number] [facility] #19   08/31/2020 09/05/2020 alprazolam 1 mg tablet  15 35 Reginold Agent [id number] [facility] #19   08/31/2020 09/04/2020 tramadol hcl 50 mg tablet  42 [place], [place] [id number]  [place] #02   08/01/2020 08/01/2020 tramadol hcl 50 mg tablet  [address] Reginold Agent [id number] [place]. #02   07/31/2020 07/31/2020 acetaminophen-codeine 120 mg-12 mg/5 ml solution  120m 6 MJohny Drilling GDarnell Level(MD) FRio [place] #02   07/10/2020 07/10/2020 tramadol hcl 50 mg tablet  [address] [id number] #02   06/12/2020 06/15/2020 alprazolam 1 mg tablet  15 35 CReginold [id number] #19   06/15/2020 06/15/2020 tramadol hcl 50 mg tablet  42 14 MJohny Drilling GDarnell Level(MD) [place] #02   05/07/2020 05/09/2020 tramadol hcl 50 mg tablet  42114 MJohny Drilling GDarnell Level(MD) [place] #02   03/14/2020 03/16/2020 alprazolam 1 mg tablet  15 35 CReginold [id number] #19   02/22/2020 02/23/2020 tramadol hcl 50 mg tablet  [address] [id number] Center #02   01/28/2020 02/02/2020 alprazolam 1 mg tablet  15  15 MJohny Drilling GDarnell Level(MD) FMar[place] #19     * - Drugs marked with an asterisk are compound drugs. If the compound drug is made up of more than one controlled substance, then each controlled substance will be a separate row in the table.           Last office visit:   10/12/2020  Patients upcoming appointments:  Future Appointments   Date Time Provider [place]  04/13/2021  2:00 PM MJohny Drilling MD [facility] None   10/15/2021  2:00 PM MJohny Drilling MD [facility] None       Recent Lab Values 10/12/2020 04/14/2019 01/04/2018 12/17/2017 10/17/2017 10/17/2017 03/31/2017   EXP DATE - 2019-08-04 9/20 02/02/2018 11-03-18 08-04-18 04/04/18   UREMK See Text - - - - - -   THCU POS - - - - - -       Recent Lab results:  GENERAL CHEMISTRY   Recent Labs     10/12/20  1255 04/25/20  1637   NA 142 140   K 4.2 4.5   CL 108 104   CO2 23 24   GAP 11 12   UN 13 10   CREAT 0.91 0.97*   GFRC  --  70   GFRB  --  81   GLU 74 79   CA 9.5 9.5      LIPID PROFILE   Recent Labs     04/25/20  1637   CHOL 122   TRIG 92   HDL 38*   LDLC 66      LIVER PROFILE   Recent Labs     10/12/20  1255 04/25/20  1637   ALT 19 25   AST 17 18   ALK 87 105   TB 0.3 0.4      DIABETES THYROID   Recent Labs     10/12/20  1255 04/25/20  1637   HA1C 6.0* 6.3*    Recent Labs     10/12/20  1255 04/25/20  1637   TSH 1.48 1.03         Pending/Orders Labs:  Lab Frequency Next Occurrence   COVID-19 PCR Once 10/24/2020

## 2020-12-28 ENCOUNTER — Encounter: Payer: Self-pay | Admitting: Gastroenterology

## 2021-01-12 ENCOUNTER — Encounter: Payer: Self-pay | Admitting: Gastroenterology

## 2021-01-17 ENCOUNTER — Other Ambulatory Visit: Payer: Self-pay | Admitting: Family Medicine

## 2021-01-17 ENCOUNTER — Other Ambulatory Visit: Payer: Self-pay

## 2021-01-17 DIAGNOSIS — Z760 Encounter for issue of repeat prescription: Secondary | ICD-10-CM

## 2021-01-18 NOTE — Telephone Encounter (Signed)
Last office visit:   10/12/2020  Patients upcoming appointments:  Future Appointments   Date Time Provider [place]   04/13/2021  2:00 PM [name], MD [facility] None   10/15/2021  2:00 PM [name], MD [facility] None       Recent Lab Values 10/12/2020 04/14/2019 01/04/2018 12/17/2017 10/17/2017 10/17/2017 03/31/2017   EXP DATE - 2019-08-04 9/20 02/02/2018 11-03-18 08-04-18 04/04/18   UREMK See Text - - - - - -   THCU POS - - - - - -       Recent Lab results:  GENERAL CHEMISTRY   Recent Labs     10/12/20  1255 04/25/20  1637   NA 142 140   K 4.2 4.5   CL 108 104   CO2 23 24   GAP 11 12   UN 13 10   CREAT 0.91 0.97*   GFRC  --  70   GFRB  --  81   GLU 74 79   CA 9.5 9.5      LIPID PROFILE   Recent Labs     04/25/20  1637   CHOL 122   TRIG 92   HDL 38*   LDLC 66      LIVER PROFILE   Recent Labs     10/12/20  1255 04/25/20  1637   ALT 19 25   AST 17 18   ALK 87 105   TB 0.3 0.4      DIABETES THYROID   Recent Labs     10/12/20  1255 04/25/20  1637   HA1C 6.0* 6.3*    Recent Labs     10/12/20  1255 04/25/20  1637   TSH 1.48 1.03         Pending/Orders Labs:  Lab Frequency Next Occurrence   COVID-19 PCR Once 10/24/2020

## 2021-01-18 NOTE — Telephone Encounter (Signed)
Last office visit:   10/12/2020  Patients upcoming appointments:  Future Appointments   Date Time Provider [place]   04/13/2021  2:00 PM [name], MD [facility] None   10/15/2021  2:00 PM [name], MD [facility] None       Recent Lab Values 10/12/2020 04/14/2019 01/04/2018 12/17/2017 10/17/2017 10/17/2017 03/31/2017   EXP DATE - 2019-08-04 9/20 02/02/2018 11-03-18 08-04-18 04/04/18   UREMK See Text - - - - - -   THCU POS - - - - - -       Recent Lab results:  GENERAL CHEMISTRY   Recent Labs     10/12/20  1255 04/25/20  1637   NA 142 140   K 4.2 4.5   CL 108 104   CO2 23 24   GAP 11 12   UN 13 10   CREAT 0.91 0.97*   GFRC  --  70   GFRB  --  81   GLU 74 79   CA 9.5 9.5      LIPID PROFILE   Recent Labs     04/25/20  1637   CHOL 122   TRIG 92   HDL 38*   LDLC 66      LIVER PROFILE   Recent Labs     10/12/20  1255 04/25/20  1637   ALT 19 25   AST 17 18   ALK 87 105   TB 0.3 0.4      DIABETES THYROID   Recent Labs     10/12/20  1255 04/25/20  1637   HA1C 6.0* 6.3*    Recent Labs     10/12/20  1255 04/25/20  1637   TSH 1.48 1.03         Pending/Orders Labs:  Lab Frequency Next Occurrence   COVID-19 PCR Once 10/24/2020         Confidential Drug Report  Search Terms: Ellar Hakala, 24-Jun-1975   Search Date: 01/18/2021 14:59:11 PM   Searching on behalf of: [id number] - [name]   The Drug Utilization Report below displays all of the controlled substance prescriptions, if any, that your patient has filled in the last twelve months. The information displayed on this report is compiled from pharmacy submissions to the Department, and accurately reflects the information as submitted by the pharmacies.  This report was requested by: Jearld Adjutant   Reference #: [id number]   Others' Prescriptions  Patient Name: Tensley Wery   Birth Date: 1975-09-29   Address: [place], [place] [address]   Sex: Female   Rx Written Rx Dispensed Drug Quantity Days Supply Prescriber Name Prescriber [place] # Payment Method Dispenser   12/26/2020  12/27/2020 tramadol hcl 50 mg tablet  42 14 Darnelle Spangle [id number] [facility] #02   12/13/2020 12/19/2020 alprazolam 1 mg tablet  15 35 Reginold Agent [id number] [facility] #19   11/27/2020 12/01/2020 tramadol hcl 50 mg tablet  42 14 Reginold Agent [id number] [place] #02   10/31/2020 11/01/2020 tramadol hcl 50 mg tablet  42 14 Reginold Agent [id number] [place]. #02   10/03/2020 10/09/2020 tramadol hcl 50 mg tablet  42 14 Darnelle Spangle [id number] [place]. #02   09/28/2020 10/04/2020 diphenoxylate-atropine 2.5-0.025 mg tablet  258 30 Reginold Agent [id number] [place]. #19   08/31/2020 09/05/2020 alprazolam 1 mg tablet  15 35 Reginold Agent [id number] [employer]  [place] #19   08/31/2020 09/04/2020 tramadol hcl 50 mg tablet  42 14 Reginold Agent [id number] [place]. #02   08/01/2020 08/01/2020 tramadol hcl 50 mg tablet  [address] Reginold Agent [id number] [place]. #02   07/31/2020 07/31/2020 acetaminophen-codeine 120 mg-12 mg/5 ml solution  157m 6 MJohny Drilling GDarnell Level(MD) [place] #02   07/10/2020 07/10/2020 tramadol hcl 50 mg tablet  [address]CReginold [id number] #02   06/12/2020 06/15/2020 alprazolam 1 mg tablet  15 35 [place] [id number] #19   06/15/2020 06/15/2020 tramadol hcl 50 mg tablet  42 14 MJohny Drilling GDarnell Level(MD) [place] #02   05/07/2020 05/09/2020 tramadol hcl 50 mg tablet  [address] MJohny Drilling GDarnell Level(MD) [place] #02   03/14/2020 03/16/2020 alprazolam 1 mg tablet  15 35 CReginold [id number] #19   02/22/2020 02/23/2020 tramadol hcl 50 mg tablet   [address] [id number] [place] #02   01/28/2020 02/02/2020 alprazolam 1 mg tablet  15 15 MJohny Drilling GDarnell Level(MD) FWest [place] #19     * - Drugs marked with an asterisk are compound drugs. If the compound drug is made up of more than one controlled substance, then each controlled substance will be a separate row in the table.

## 2021-01-19 ENCOUNTER — Other Ambulatory Visit: Payer: Self-pay | Admitting: Psychiatry

## 2021-01-19 ENCOUNTER — Encounter: Payer: Self-pay | Admitting: Primary Care

## 2021-01-19 NOTE — Telephone Encounter (Signed)
Dr. Laurance Flatten - I have scheduled the patient with you on Monday at 4:20, but I had to use one of your same days because you did not have anymore availability next week. Is this alright for me to do?

## 2021-01-22 ENCOUNTER — Encounter: Payer: Self-pay | Admitting: Primary Care

## 2021-01-22 ENCOUNTER — Ambulatory Visit: Payer: Medicare (Managed Care) | Attending: Primary Care | Admitting: Primary Care

## 2021-01-22 VITALS — BP 118/74 | HR 70 | Ht 67.01 in | Wt 224.0 lb

## 2021-01-22 DIAGNOSIS — I7300 Raynaud's syndrome without gangrene: Secondary | ICD-10-CM | POA: Insufficient documentation

## 2021-01-22 DIAGNOSIS — M2550 Pain in unspecified joint: Secondary | ICD-10-CM | POA: Insufficient documentation

## 2021-01-22 DIAGNOSIS — R21 Rash and other nonspecific skin eruption: Secondary | ICD-10-CM | POA: Insufficient documentation

## 2021-01-22 DIAGNOSIS — R109 Unspecified abdominal pain: Secondary | ICD-10-CM | POA: Insufficient documentation

## 2021-01-22 LAB — URINALYSIS WITH MICROSCOPIC
Bacteria,UA: NONE SEEN
Blood,UA: NEGATIVE
Glucose,UA: NEGATIVE
Leuk Esterase,UA: NEGATIVE
Nitrite,UA: NEGATIVE
Specific Gravity,UA: 1.029 (ref 1.002–1.030)
pH,UA: 6 (ref 5.0–8.0)

## 2021-01-22 NOTE — Progress Notes (Signed)
[facility]          SUBJECTIVE    Pt is here to discuss:    Chief Complaint   Patient presents with    Generalized Body Aches     patient here for body aches 7/10         B/l shoulders, wrists, ankles, back pain. Worse at night, wakes her up screaming when she moves. Describes R lower rib pain,R flank pain, b/l lower quadrant abd pain, urinary urgency and hesitancy with urinary discoloration. These are new symptoms for her since I last saw her in june.     Rash has returned on cheeks/nose, also has significantly dry and flaky skin of b/l hands. Has some red marks similar to her cheeks that come/go on her forearms.     Using baclofen and tramadol which takes the edge off the pain. Her wife has medical marijuana and pt sits with her while she uses this- recent UDS was + but pt denies personal use    Had syncope on Thursday, first time since May.   + sores in mouth on bl cheeks, feels similar to cuts like she used to get from braces in her childhood     Bruising    No joint swelling or redness  Elbows pop  +numbness and weakness of fingers. Dropping things         2. Obesity - interval weight loss, doing this through myfitnesspal and limiting to 1600 calories daily. She admits to eating mostly processed foods, thinks it's more weight loss than she'd expected.            PMH / Family Hx / Social Hx  Patient's medications, allergies, problem list, past medical, social histories were reviewed and notable for:       Current Outpatient Medications   Medication Sig Note    dicyclomine (BENTYL) 10 mg capsule TAKE 1 CAPSULE BY MOUTH FOUR TIMES DAILY (BEFORE MEALS AND NIGHTLY)     traMADol (ULTRAM) 50 mg tablet TAKE 1 TABLET BY MOUTH EVERY 8 HOURS AS NEEDED FOR HEAD AND ABDOMINAL PAIN MAXIMUM DAILY DOSE OF 3 PER DAY     cetirizine (ZYRTEC) 10 mg tablet TAKE 1 TABLET BY MOUTH TWO TIMES DAILY     fluticasone (FLOVENT HFA) 220 MCG/ACT inhaler Inhale 1 puff into the lungs daily  Shake well before each use.      chlorhexidine 2 % LIQD Use to wash axillae once daily     fluticasone (FLONASE) 50 MCG/ACT nasal spray SPRAY 1 SPRAY IN EACH NOSTRIL DAILY     pantoprazole (PROTONIX) 40 mg EC tablet TAKE 1 TABLET BY MOUTH EVERY DAY, SWALLOW WHOLE. DO NOT BREAK, CHEW OR CRUSH     ascorbic acid (VITAMIN C) 500 MG tablet TAKE 1 TABLET BY MOUTH EVERY DAY     lamoTRIgine (LAMICTAL) 200 mg tablet Take 1 tablet (200 mg total) by mouth daily  Replacing 454UJ dose     TRULICITY 8.11 BJ/4.7WG pen INJECT 0.75 MG SUBCUTANEOUSLY (UNDER THE SKIN) EVERY 7 DAYS     topiramate (TOPAMAX) 100 mg tablet TAKE 1 TABLET BY MOUTH TWO TIMES DAILY     busPIRone (BUSPAR) 30 mg tablet TAKE 1 TABLET BY MOUTH TWO TIMES DAILY     DULoxetine (CYMBALTA) 60 mg DR capsule TAKE 1 CAPSULE BY MOUTH EVERY DAY     QUEtiapine (SEROQUEL XR) 50 mg 24 hr tablet Take 1 tablet (50 mg total) by mouth nightly  Swallow whole. Do not  chew, crush, or break.     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     clotrimazole (LOTRIMIN) 1 % cream Apply topically 2 times daily  for Skin Infection due to Candida Yeast     clindamycin (CLEOCIN T) 1 % external solution APPLY TOPICALLY TWO TIMES DAILY, CONTINUE UNTIL IMPROVEMENT IN CYSTS     azelastine (OPTIVAR) 0.05 % ophthalmic solution Place 1 drop into both eyes 2 times daily     atorvastatin (LIPITOR) 40 MG tablet Take 1 tablet (40 mg total) by mouth daily (with dinner)     metoprolol (TOPROL-XL) 25 MG 24 hr tablet Take by mouth daily    06/10/2016: Received from: External Pharmacy    midodrine (PROAMATINE) 10 MG tablet Take 1 tablet (10 mg total) by mouth 3 times daily     Insulin Disposable Pump (OMNIPOD DASH PODS, GEN 4,) MISC By 1 device no specified route as needed  Change every 2 days = 45 pods for 90 days supply = 9 (5packs)     ALPRAZolam (XANAX) 1 mg tablet TAKE 1 TABLET BY MOUTH THREE TIMES WEEKLY  AS NEEDED MAXIMUM DAILY DOSE OF 1 PER DAY     Ostomy Supplies (SKIN PREP WIPES) MISC Use topically as directed     Na Sulfate-K Sulfate-Mg Sulf (SUPREP BOWEL PREP KIT) 17.5-3.13-1.6 GM/177ML SOLN Take first dose at 6pm the day prior to colonoscopy and the second dose 6 hours prior to colonoscopy (Patient not taking: Reported on 01/22/2021)     famotidine (PEPCID) 20 mg tablet TAKE 1 TABLET BY MOUTH EVERY EVENING AS NEEDED FOR HEARTBURN     diphenoxylate-atropine (LOMOTIL) 2.5-0.025 MG per tablet Take 1-2 tablets by mouth 4 times daily as needed for Diarrhea  Max daily dose: 8 tablets Code D     albuterol HFA (PROVENTIL, VENTOLIN, PROAIR HFA) 108 (90 Base) MCG/ACT inhaler INHALE 1 TO 2 PUFFS BY MOUTH EVERY 6 HOURS AS NEEDED FOR WHEEXING. SHAKE WELL BEFORE EACH USE     Continuous Blood Gluc Receiver ([place]) By no specified route as needed  Use as directed to monitor blood glucose levels up to 10 x daily     Continuous Blood Gluc Sensor (DEXCOM G6 SENSOR) Apply sensor as directed. Change every 10 days.     Continuous Blood Gluc Transmit (DEXCOM G6 TRANSMITTER) Attach to sensor. Change every 90 days     Alcohol Swabs (B-D SINGLE USE SWABS REGULAR) PADS USE 1 PAD TOPICALLY TWO TIMES DAILY TO CHECK BLOOD GLUCOSE     triamcinolone (KENALOG) 0.025 % ointment Apply topically 2 times daily  to the following areas: L underarm     insulin lispro 100 UNIT/ML injection vial Inject as directed via pump up to Maximum  60  Units/day. Discard vial 28 days after first use.     SUMAtriptan (IMITREX) 50 mg tablet Take at onset of headache. May repeat once in 2 hours. TAKE 1 TABLET BY MOUTH AS NEEDED FOR MIGRAINE, TAKE AT ONSET OF HEADACHE, MAY REPEAT ONCE IN 2 HOURS     insulin lispro 100 UNIT/ML injection vial Inject as directed via pump up to Maximum  60  Units/day     blood glucose test strip Test  8 times a day.   Brand name of strips Contour NEXT test strips for linked omnipod pump     doxycycline hyclate  (VIBRA-TABS)  100 mg tablet TAKE 1 TABLET BY MOUTH EVERY 12 HOURS AS NEEDED FOR 7 DAYS     promethazine (PHENERGAN) 25 MG tablet Take 1 tablet (25 mg total) by mouth every 4-6 hours as needed     triamcinolone (KENALOG) 0.5 % cream Apply topically 2 times daily to the following areas: arms     ascorbic acid (VITAMIN C) 100 MG tablet Take 5 tablets (500 mg total) by mouth daily (Patient not taking: Reported on 01/22/2021)     Continuous Blood Gluc Receiver (DEXCOM G6 RECEIVER) By no specified route as needed     benzoyl peroxide 10 % LIQD external liquid Apply topically 2 times daily to the following areas: under arms, under breast, groin (Patient not taking: Reported on 01/22/2021)     LIDOCAINE HCL URETHRAL/MUCOSAL 2 % jelly APPLY TOPICALLY THREE TIMES DAILY AS NEEDED FOR PAIN     insulin syringe-needle U-100 (BD INSULIN SYRINGE U/F) 31G X 5/16" 0.3 ML HALF-UNIT USE THREE TIMES DAILY AS INSTRUCTED     blood pressure monitor, automatic with arm cuff Use as directed to monitor blood pressure     zinc oxide 20 % ointment Apply topically as needed for Dry Skin (Patient not taking: Reported on 01/22/2021)     glucagon (BAQSIMI) 3 MG/DOSE nasal powder Inhale one dose (3 mg) into one nostril once as needed for low blood sugar. If no response in 15 min, inhale second dose from new device     lancets (BAYER MICROLET) Use   8  times per day as instructed for blood glucose testing.     SUMAtriptan refill (IMITREX STATDOSE) 6 MG/0.5ML injection INJECT 0.5ML UNDER THE SKIN ONCE AS NEEDED FOR MIGRAINE. MAY REPEAT DOSE ONCE AFTER 1 HOUR (Patient not taking: Reported on 01/22/2021)     glucagon (GLUCAGEN) 1 MG injection kit Inject 1 mg into the skin once as needed for Low blood sugar   Discard any unused portion.     Non-System Medication The above patient is followed in our clinic and cannot resume work permanently.           OBJECTIVE  Vitals:    01/22/21 1622   BP: 118/74   BP Location: Left arm   Patient Position:  Sitting   Pulse: 70   SpO2: 100%   Weight: 101.6 kg (224 lb)   Height: 1.702 m (5' 7.01")     Body mass index is 35.07 kg/m.      General: well-appearing Caucasian female, pleasant & conversant, in NAD  Eyes:. PERRLA. EOMI. Conjunctiva pink, without swelling or exudate. Lids normal. No heliotropic rash.  ENT: Moist mucous membranes. No obvious sores in buccal mucosa. Normal lips and dentition.     Abd: S, obese. TTP at RUQ, R flank, RLQ.  Normoactive BS.  No masses or organomegaly.     Skin: diffuse flaking and dry scale changes to b/l palms. Diffuse erythema of malar region with some telangiectasias. No ulcerations. No scalp lesions. No forearm lesions or shawl sign or lesions on knuckles.   Psych: AAOx3, normal affect and mood. Insight and judgement intact.     Kaylee Jordan, MD,certify that the patient has authorized the use of photography for the purpose of the provision of health care at the [facility] and affiliates and they understand that it will be included in the legal medical record.                ASSESSMENT & PLAN  1.  Abdominal pain, unspecified abdominal location  Ddx includes renal colic, intraabdominal process, less likely acute urinary retention. Will start with UA and consider imaging and w/u for hematuria pending results.  - Comprehensive metabolic panel; Future  - CBC and differential; Future  - Urinalysis with microscopic; Future  - Urinalysis with microscopic    2. Malar rash   3. Arthralgia, unspecified joint   4. Raynaud's phenomenon     - AMB eConsult to Adult Rheumatology (Primary Care Use Only)  - Anti DS-dna AB; Future  - ANTI RNP/SMITH; Future  - ANTI-SSA/SSB; Future  - C3 complement; Future  - C4 complement; Future  - Comprehensive metabolic panel; Future  - CBC and differential; Future    Recommended rheum consult to r/o lupus or other autoimmune connective tissue disease. Previous testing showed neg ANA however; will get additional labs as above and  any other additional tests per rheum econsult.     Follow-up: pending results/consult      [name], MD  [place]  01/22/2021  5:21 PM        ______________________

## 2021-01-23 ENCOUNTER — Other Ambulatory Visit: Payer: Self-pay | Admitting: Primary Care

## 2021-01-23 ENCOUNTER — Encounter: Payer: Self-pay | Admitting: Primary Care

## 2021-01-23 ENCOUNTER — Other Ambulatory Visit: Payer: Self-pay | Admitting: Obstetrics and Gynecology

## 2021-01-23 ENCOUNTER — Other Ambulatory Visit: Payer: Medicare (Managed Care) | Admitting: Immunology

## 2021-01-23 ENCOUNTER — Telehealth: Payer: Self-pay

## 2021-01-23 DIAGNOSIS — G43909 Migraine, unspecified, not intractable, without status migrainosus: Secondary | ICD-10-CM

## 2021-01-23 DIAGNOSIS — R21 Rash and other nonspecific skin eruption: Secondary | ICD-10-CM

## 2021-01-23 MED ORDER — SUMATRIPTAN SUCCINATE REFILL 6 MG/0.5ML SC SOLN *A*
6.0000 mg | Freq: Once | SUBCUTANEOUS | 5 refills | Status: AC | PRN
Start: 2021-01-23 — End: 2021-01-23

## 2021-01-23 MED ORDER — GLUCAGON 3 MG/DOSE NA POWD *I*
NASAL | 5 refills | Status: DC
Start: 2021-01-23 — End: 2021-01-24

## 2021-01-23 NOTE — Provider Consult (Signed)
Consulting Provider Response     Question:  What additional tests to order for a patient with suspicion of autoimmune disease    Recommendations:  Please have the patient follow-up with dermatology for consideration of skin biopsy    Rationale:  The patient is ANA negative.  Consequently, she should also be negative for all of the extractable nuclear antigens, except for possibly anti-Ro (SSA).  Cell substrates used for for immunofluorescent staining of ANA can be relatively low in content of Ro antigen.  Ordering DS-DNA, Smith, RNP antibodies is not advised for those patients who are ANA negative.  The patient has seen dermatology in the past where there was some consideration that her rash could reflect erythromelalgia.  The photos seem more consistent with rosacea.  If there is any suspicion for cutaneous lupus, this could be confirmed with a skin biopsy.        This eConsult is focused on the specific clinical question(s) asked by the referring clinician, is based on the clinical data available to me, the consulting physician at the time of the request, and is furnished without benefit of a comprehensive evaluation of physical examination of the patient by me. The guidance set forth in the eConsult note will need to be interpreted in light of any clinical issues not known to me or any changes in patient status that I may not be aware of at the time of filing this eConsult. If further consultation is necessary, an in-person visit with me or another member of our group is an option.

## 2021-01-23 NOTE — Telephone Encounter (Signed)
Last office visit:   01/22/2021  Patients upcoming appointments:  Future Appointments   Date Time Provider [place]   02/13/2021  1:55 PM CANALSIDE, [place] [facility] None   03/07/2021  2:00 PM Nordquist, Janett Billow, RN [place] None   03/26/2021 10:30 AM Gae Dry, NP H[facility] None   04/13/2021  2:00 PM [name], MD [facility] None   10/15/2021  2:00 PM [name], MD [facility] None       Recent Lab Values 10/12/2020 04/14/2019 01/04/2018 12/17/2017 10/17/2017 10/17/2017 03/31/2017   EXP DATE - 2019-08-04 9/20 02/02/2018 11-03-18 08-04-18 04/04/18   UREMK See Text - - - - - -   THCU POS - - - - - -       Recent Lab results:  GENERAL CHEMISTRY   Recent Labs     10/12/20  1255 04/25/20  1637   NA 142 140   K 4.2 4.5   CL 108 104   CO2 23 24   GAP 11 12   UN 13 10   CREAT 0.91 0.97*   GFRC  --  70   GFRB  --  81   GLU 74 79   CA 9.5 9.5      LIPID PROFILE   Recent Labs     04/25/20  1637   CHOL 122   TRIG 92   HDL 38*   LDLC 66      LIVER PROFILE   Recent Labs     10/12/20  1255 04/25/20  1637   ALT 19 25   AST 17 18   ALK 87 105   TB 0.3 0.4      DIABETES THYROID   Recent Labs     10/12/20  1255 04/25/20  1637   HA1C 6.0* 6.3*    Recent Labs     10/12/20  1255 04/25/20  1637   TSH 1.48 1.03         Pending/Orders Labs:  Lab Frequency Next Occurrence   COVID-19 PCR Once 10/24/2020   Anti DS-dna AB Once 01/22/2021   ANTI RNP/SMITH Once 01/22/2021   ANTI-SSA/SSB Once 01/22/2021   C3 complement Once 01/22/2021   C4 complement Once 01/22/2021   Comprehensive metabolic panel Once 85/06/7739   CBC and differential Once 01/22/2021

## 2021-01-23 NOTE — Telephone Encounter (Signed)
I left a message for [place] to call and schedule a follow up appointment with Dr. April Manson and Gay Filler per Seth Bake.  Reade Trefz

## 2021-01-24 ENCOUNTER — Other Ambulatory Visit: Payer: Self-pay | Admitting: Endocrinology

## 2021-01-24 NOTE — Telephone Encounter (Signed)
Pt refill.

## 2021-01-25 MED ORDER — GLUCAGON 3 MG/DOSE NA POWD *I*
NASAL | 5 refills | Status: AC
Start: 2021-01-25 — End: ?

## 2021-01-26 ENCOUNTER — Other Ambulatory Visit: Payer: Medicare (Managed Care) | Admitting: Primary Care

## 2021-01-26 DIAGNOSIS — R21 Rash and other nonspecific skin eruption: Secondary | ICD-10-CM

## 2021-01-26 MED ORDER — BACLOFEN 10 MG PO TABS *I*
10.0000 mg | ORAL_TABLET | Freq: Three times a day (TID) | ORAL | 1 refills | Status: DC | PRN
Start: 2021-01-26 — End: 2021-03-21

## 2021-01-26 NOTE — Progress Notes (Signed)
Ordering Provider Response       Reviewed via mychart    Will refer to derm for management of malar rash    Will get EMG for hand symptoms    Refilled baclofen for other pains    [name], MD  [facility]  01/26/2021  7:22 AM

## 2021-01-29 ENCOUNTER — Other Ambulatory Visit
Admission: RE | Admit: 2021-01-29 | Discharge: 2021-01-29 | Disposition: A | Discharge status: Home / Self Care | Payer: Medicare (Managed Care) | Source: Ambulatory Visit | Attending: Primary Care | Admitting: Primary Care

## 2021-01-29 DIAGNOSIS — R21 Rash and other nonspecific skin eruption: Secondary | ICD-10-CM | POA: Insufficient documentation

## 2021-01-29 DIAGNOSIS — M2550 Pain in unspecified joint: Secondary | ICD-10-CM | POA: Insufficient documentation

## 2021-01-29 DIAGNOSIS — I7300 Raynaud's syndrome without gangrene: Secondary | ICD-10-CM | POA: Insufficient documentation

## 2021-01-29 LAB — CBC AND DIFFERENTIAL
Baso # K/uL: 0.1 10*3/uL (ref 0.0–0.1)
Basophil %: 0.9 %
Eos # K/uL: 0.2 10*3/uL (ref 0.0–0.4)
Eosinophil %: 2.1 %
Hematocrit: 45 % (ref 34–45)
Hemoglobin: 14.4 g/dL (ref 11.2–15.7)
IMM Granulocytes #: 0 10*3/uL (ref 0.0–0.0)
IMM Granulocytes: 0.3 %
Lymph # K/uL: 2.8 10*3/uL (ref 1.2–3.7)
Lymphocyte %: 29 %
MCH: 30 pg (ref 26–32)
MCHC: 32 g/dL (ref 32–36)
MCV: 92 fL (ref 79–95)
Mono # K/uL: 0.7 10*3/uL (ref 0.2–0.9)
Monocyte %: 6.8 %
Neut # K/uL: 5.8 10*3/uL (ref 1.6–6.1)
Nucl RBC # K/uL: 0 10*3/uL (ref 0.0–0.0)
Nucl RBC %: 0 /100 WBC (ref 0.0–0.2)
Platelets: 348 10*3/uL (ref 160–370)
RBC: 4.9 MIL/uL (ref 3.9–5.2)
RDW: 13 % (ref 11.7–14.4)
Seg Neut %: 60.9 %
WBC: 9.5 10*3/uL (ref 4.0–10.0)

## 2021-01-29 LAB — C3 COMPLEMENT: C3: 120 mg/dL (ref 90–180)

## 2021-01-29 LAB — COMPREHENSIVE METABOLIC PANEL
ALT: 49 U/L — ABNORMAL HIGH (ref 0–35)
AST: 24 U/L (ref 0–35)
Albumin: 4.6 g/dL (ref 3.5–5.2)
Alk Phos: 101 U/L (ref 35–105)
Anion Gap: 12 (ref 7–16)
Bilirubin,Total: 0.4 mg/dL (ref 0.0–1.2)
CO2: 24 mmol/L (ref 20–28)
Calcium: 9.7 mg/dL (ref 8.6–10.2)
Chloride: 106 mmol/L (ref 96–108)
Creatinine: 0.96 mg/dL — ABNORMAL HIGH (ref 0.51–0.95)
Glucose: 80 mg/dL (ref 60–99)
Lab: 15 mg/dL (ref 6–20)
Potassium: 4.5 mmol/L (ref 3.3–5.1)
Sodium: 142 mmol/L (ref 133–145)
Total Protein: 6.9 g/dL (ref 6.3–7.7)
eGFR BY CREAT: 74 *

## 2021-01-29 LAB — C4 COMPLEMENT: C4: 35 mg/dL (ref 10–40)

## 2021-01-30 LAB — ANTI RNP/SMITH
Anti-RNP: <0.2 AI (ref 0.0–0.9)
Anti-Smith: <0.2 AI (ref 0.0–0.9)
SM/RNP AB: <0.2 AI (ref 0.0–0.9)

## 2021-01-30 LAB — ANTI-SSA/SSB
Anti-LA/SS-B: <0.2 AI (ref 0.0–0.9)
Anti-RO/SS-A: <0.2 AI (ref 0.0–0.9)

## 2021-01-30 LAB — ANTI DS-DNA AB: dsDNA Ab: 1 IU/mL (ref 0–4)

## 2021-01-30 NOTE — Addendum Note (Signed)
Addended by: Grandville Silos on: 01/30/2021 06:31 PM     Modules accepted: Orders

## 2021-01-31 ENCOUNTER — Other Ambulatory Visit: Payer: Medicare (Managed Care) | Admitting: Dermatology

## 2021-01-31 DIAGNOSIS — L719 Rosacea, unspecified: Secondary | ICD-10-CM

## 2021-01-31 NOTE — Provider Consult (Signed)
Consulting Provider Response   Dermatology E-Consult    Question    Facial rash    History  Received From Referring Provider    Photographs  Reviewed in Media Tab    Quality  Excellent    Please visit the following website to see an excellent and concise guide for taking medical photographs:  [email or web address]    Assessment    Rosacea, erythrotelangiectatic variant    Triage  Recommend No Referral - PCP Management    Plan    I agree with the diagnosis of rosacea, given photo and negative connective tissue workup.  Her variant of rosacea (flushing and blushing) can be stubborn to treatment.  First line treatment includes a daily application of metronidazole 0.75% cream.  This can be used as a daily moisturizer.  It will take 3 months to see the full effect and she will require maintenance treatment long term.  Avoidance of triggers can also help.          Time Spent: 5 minutes    This eConsult is focused on the specific clinical question(s) asked by the referring clinician, is based on the clinical data available to me, the consulting physician at the time off the request, and is furnished without benefit of a comprehensive evaluation of physical examination of the patient by me. The guidance set forth in the eConsult note will need to be interpreted in light of any clinical issues not known to me or any changes in patient status that I may not be aware of at the time of filing this eConsult. If further consultation is necessary, an in-person visit with me or another member of our group is an option.    Burnard Leigh, MD  Dermatology Attending

## 2021-02-07 ENCOUNTER — Other Ambulatory Visit: Payer: Self-pay

## 2021-02-07 ENCOUNTER — Other Ambulatory Visit: Payer: Self-pay | Admitting: Family Medicine

## 2021-02-07 ENCOUNTER — Other Ambulatory Visit: Payer: Self-pay | Admitting: Dermatology

## 2021-02-07 DIAGNOSIS — Z9109 Other allergy status, other than to drugs and biological substances: Secondary | ICD-10-CM

## 2021-02-07 DIAGNOSIS — F319 Bipolar disorder, unspecified: Secondary | ICD-10-CM

## 2021-02-07 DIAGNOSIS — F603 Borderline personality disorder: Secondary | ICD-10-CM

## 2021-02-07 DIAGNOSIS — Z760 Encounter for issue of repeat prescription: Secondary | ICD-10-CM

## 2021-02-08 ENCOUNTER — Other Ambulatory Visit: Payer: Self-pay | Admitting: Family Medicine

## 2021-02-08 ENCOUNTER — Encounter: Payer: Self-pay | Admitting: Student in an Organized Health Care Education/Training Program

## 2021-02-08 DIAGNOSIS — Z9109 Other allergy status, other than to drugs and biological substances: Secondary | ICD-10-CM

## 2021-02-08 NOTE — Telephone Encounter (Signed)
Last office visit:   01/22/2021  Patients upcoming appointments:  Future Appointments   Date Time Provider [place]   03/07/2021  2:00 PM Nordquist, Janett Billow, RN [place] None   03/26/2021 10:30 AM Gae Dry, NP [facility]EC None   04/13/2021  2:00 PM [name], MD CAF None   10/15/2021  2:00 PM [name], MD [facility] None       Recent Lab Values 10/12/2020 04/14/2019 01/04/2018 12/17/2017 10/17/2017 10/17/2017 03/31/2017   EXP DATE - 2019-08-04 9/20 02/02/2018 11-03-18 08-04-18 04/04/18   UREMK See Text - - - - - -   THCU POS - - - - - -       Recent Lab results:  GENERAL CHEMISTRY   Recent Labs     01/29/21  1427 10/12/20  1255 04/25/20  1637   NA 142 142 140   K 4.5 4.2 4.5   CL 106 108 104   CO2 _0 GAP _1 UN _2 CREAT 0.96* 0.91 0.97*   GFRC  --   --  70   GFRB  --   --  81   GLU 80 74 79   CA 9.7 9.5 9.5      LIPID PROFILE   Recent Labs     04/25/20  1637   CHOL 122   TRIG 92   HDL 38*   LDLC 66      LIVER PROFILE   Recent Labs     01/29/21  1427 10/12/20  1255 04/25/20  1637   ALT 49* 19 25   AST _3 ALK 101 87 105   TB 0.4 0.3 0.4      DIABETES THYROID   Recent Labs     10/12/20  1255 04/25/20  1637   HA1C 6.0* 6.3*    Recent Labs     10/12/20  1255 04/25/20  1637   TSH 1.48 1.03         Pending/Orders Labs:  Lab Frequency Next Occurrence   COVID-19 PCR Once 10/24/2020

## 2021-02-08 NOTE — Telephone Encounter (Signed)
Last office visit:   01/22/2021  Patients upcoming appointments:  Future Appointments   Date Time Provider [place]   03/07/2021  2:00 PM Nordquist, Janett Billow, RN [place] None   03/26/2021 10:30 AM Gae Dry, NP [facility] None   04/13/2021  2:00 PM [name], MD [facility] None   10/15/2021  2:00 PM [name], MD [facility] None       Recent Lab Values 10/12/2020 04/14/2019 01/04/2018 12/17/2017 10/17/2017 10/17/2017 03/31/2017   EXP DATE - 2019-08-04 9/20 02/02/2018 11-03-18 08-04-18 04/04/18   UREMK See Text - - - - - -   THCU POS - - - - - -       Recent Lab results:  GENERAL CHEMISTRY   Recent Labs     01/29/21  1427 10/12/20  1255 04/25/20  1637   NA 142 142 140   K 4.5 4.2 4.5   CL 106 108 104   CO2 '24 23 24   ' GAP '12 11 12   ' UN '15 13 10   ' CREAT 0.96* 0.91 0.97*   GFRC  --   --  70   GFRB  --   --  81   GLU 80 74 79   CA 9.7 9.5 9.5      LIPID PROFILE   Recent Labs     04/25/20  1637   CHOL 122   TRIG 92   HDL 38*   LDLC 66      LIVER PROFILE   Recent Labs     01/29/21  1427 10/12/20  1255 04/25/20  1637   ALT 49* 19 25   AST '24 17 18   ' ALK 101 87 105   TB 0.4 0.3 0.4      DIABETES THYROID   Recent Labs     10/12/20  1255 04/25/20  1637   HA1C 6.0* 6.3*    Recent Labs     10/12/20  1255 04/25/20  1637   TSH 1.48 1.03         Pending/Orders Labs:  Lab Frequency Next Occurrence   COVID-19 PCR Once 10/24/2020      Patient Name: Kaylee Harris   Birth Date: 11/26/1975   Address: [place], [place] [address]   Sex: Female   Rx Written Rx Dispensed Drug Quantity Days Supply Prescriber Name Prescriber Dea # Payment Method Dispenser   01/18/2021 01/19/2021 tramadol hcl 50 mg tablet  42 14 [place], [place]. #02   12/26/2020 12/27/2020 tramadol hcl 50 mg tablet  42 14 Phillips, [place]. #02   12/13/2020 12/19/2020 alprazolam 1 mg tablet  15 35 Reginold Agent [id number] [place]. #19   11/27/2020 12/01/2020 tramadol  hcl 50 mg tablet  42 14 Reginold Agent [id number] [place] #02   10/31/2020 11/01/2020 tramadol hcl 50 mg tablet  42 14 Reginold Agent [id number] [place] #02   10/03/2020 10/09/2020 tramadol hcl 50 mg tablet  42 14 Darnelle Spangle [id number] [employer] #02   09/28/2020 10/04/2020 diphenoxylate-atropine 2.5-0.025 mg tablet  427 30 Reginold Agent [id number] [place]. #19   08/31/2020 09/05/2020 alprazolam 1 mg tablet  15 35 Reginold Agent [id number] Insurance [place] #19   08/31/2020 09/04/2020 tramadol hcl 50 mg tablet  42 14 Reginold Agent [id number] [place]. #02   08/01/2020 08/01/2020  tramadol hcl 50 mg tablet  [address] Reginold Agent [id number] [place] #02   07/31/2020 07/31/2020 acetaminophen-codeine 120 mg-12 mg/5 ml solution  196m 6 MJohny Drilling GDarnell Level(MD) FNormanna #02   07/10/2020 07/10/2020 tramadol hcl 50 mg tablet  [address] [id number] [place] #02   06/12/2020 06/15/2020 alprazolam 1 mg tablet  15 35 CReginold [id number] #19   06/15/2020 06/15/2020 tramadol hcl 50 mg tablet  42 14 MJohny Drilling GDarnell Level(MD) FMeadow #02   05/07/2020 05/09/2020 tramadol hcl 50 mg tablet  42 14 MJohny Drilling GDarnell Level(MD) FBronaugh #02   03/14/2020 03/16/2020 alprazolam 1 mg tablet  15 35 CReginold [id number] #19   02/22/2020 02/23/2020 tramadol hcl 50 mg tablet  [address]CRegin[address] [id number] #[phone]

## 2021-02-09 MED ORDER — DOXYCYCLINE HYCLATE 100 MG PO TABS *I*
100.0000 mg | ORAL_TABLET | Freq: Two times a day (BID) | ORAL | 2 refills | Status: AC
Start: 2021-02-09 — End: ?

## 2021-02-09 NOTE — Telephone Encounter (Signed)
Last office visit:   01/22/2021  Patients upcoming appointments:  Future Appointments   Date Time Provider Department Center   03/07/2021  2:00 PM Nordquist, Sally C, RN [facility] None   03/26/2021 10:30 AM Culhane, Carla, NP [facility] None   04/13/2021  2:00 PM Moore, Jillian, MD [facility] None   10/15/2021  2:00 PM Moore, Jillian, MD [facility] None       Recent Lab Values 10/12/2020 04/14/2019 01/04/2018 12/17/2017 10/17/2017 10/17/2017 03/31/2017   EXP DATE - 2019-08-04 9/20 02/02/2018 11-03-18 08-04-18 04/04/18   UREMK See Text - - - - - -   THCU POS - - - - - -       Recent Lab results:  GENERAL CHEMISTRY   Recent Labs     01/29/21  1427 10/12/20  1255 04/25/20  1637   NA 142 142 140   K 4.5 4.2 4.5   CL 106 108 104   CO2 24 23 24   GAP 12 11 12   UN 15 13 10   CREAT 0.96* 0.91 0.97*   GFRC  --   --  70   GFRB  --   --  81   GLU 80 74 79   CA 9.7 9.5 9.5      LIPID PROFILE   Recent Labs     04/25/20  1637   CHOL 122   TRIG 92   HDL 38*   LDLC 66      LIVER PROFILE   Recent Labs     01/29/21  1427 10/12/20  1255 04/25/20  1637   ALT 49* 19 25   AST 24 17 18   ALK 101 87 105   TB 0.4 0.3 0.4      DIABETES THYROID   Recent Labs     10/12/20  1255 04/25/20  1637   HA1C 6.0* 6.3*    Recent Labs     10/12/20  1255 04/25/20  1637   TSH 1.48 1.03         Pending/Orders Labs:  Lab Frequency Next Occurrence   COVID-19 PCR Once 10/24/2020

## 2021-02-10 ENCOUNTER — Other Ambulatory Visit: Payer: Medicare (Managed Care) | Admitting: Primary Care

## 2021-02-10 MED ORDER — METRONIDAZOLE 0.75 % EX GEL *I*
Freq: Two times a day (BID) | CUTANEOUS | 5 refills | Status: DC
Start: 2021-02-10 — End: 2021-03-19

## 2021-02-10 NOTE — Addendum Note (Signed)
Addended by: Grandville Silos on: 02/10/2021 04:48 PM     Modules accepted: Orders

## 2021-02-10 NOTE — Progress Notes (Signed)
Ordering Provider Response       Sent mychart

## 2021-02-13 ENCOUNTER — Ambulatory Visit: Payer: Medicare (Managed Care)

## 2021-02-13 ENCOUNTER — Encounter: Payer: Self-pay | Admitting: Gastroenterology

## 2021-02-14 ENCOUNTER — Encounter: Payer: Self-pay | Admitting: Gastroenterology

## 2021-02-26 ENCOUNTER — Encounter: Payer: Self-pay | Admitting: Gastroenterology

## 2021-03-01 ENCOUNTER — Other Ambulatory Visit: Payer: Self-pay

## 2021-03-01 ENCOUNTER — Other Ambulatory Visit: Payer: Self-pay | Admitting: Family Medicine

## 2021-03-01 ENCOUNTER — Other Ambulatory Visit: Payer: Self-pay | Admitting: Psychiatry

## 2021-03-01 DIAGNOSIS — K589 Irritable bowel syndrome without diarrhea: Secondary | ICD-10-CM

## 2021-03-01 DIAGNOSIS — Z760 Encounter for issue of repeat prescription: Secondary | ICD-10-CM

## 2021-03-01 NOTE — Telephone Encounter (Signed)
Last office visit:   01/22/2021  Patients upcoming appointments:  Future Appointments   Date Time Provider [place]   03/07/2021  2:00 PM Nordquist, Janett Billow, RN [place] None   03/26/2021 10:30 AM Gae Dry, NP [facility] None   04/13/2021  2:00 PM [name], MD [facility] None   10/15/2021  2:00 PM [name], MD [facility] None       Recent Lab Values 10/12/2020 04/14/2019 01/04/2018 12/17/2017 10/17/2017 10/17/2017 03/31/2017   EXP DATE - 2019-08-04 9/20 02/02/2018 11-03-18 08-04-18 04/04/18   UREMK See Text - - - - - -   THCU POS - - - - - -       Recent Lab results:  GENERAL CHEMISTRY   Recent Labs     01/29/21  1427 10/12/20  1255 04/25/20  1637   NA 142 142 140   K 4.5 4.2 4.5   CL 106 108 104   CO2 '24 23 24   ' GAP '12 11 12   ' UN '15 13 10   ' CREAT 0.96* 0.91 0.97*   GFRC  --   --  70   GFRB  --   --  81   GLU 80 74 79   CA 9.7 9.5 9.5      LIPID PROFILE   Recent Labs     04/25/20  1637   CHOL 122   TRIG 92   HDL 38*   LDLC 66      LIVER PROFILE   Recent Labs     01/29/21  1427 10/12/20  1255 04/25/20  1637   ALT 49* 19 25   AST '24 17 18   ' ALK 101 87 105   TB 0.4 0.3 0.4      DIABETES THYROID   Recent Labs     10/12/20  1255 04/25/20  1637   HA1C 6.0* 6.3*    Recent Labs     10/12/20  1255 04/25/20  1637   TSH 1.48 1.03         Pending/Orders Labs:  Lab Frequency Next Occurrence   COVID-19 PCR Once 10/24/2020         Confidential Drug Report  Search Terms: Khyli Swaim, [birth year]   Search Date: 03/01/2021 11:55:02 AM   Searching on behalf of: [id number] - [name]   The Drug Utilization Report below displays all of the controlled substance prescriptions, if any, that your patient has filled in the last twelve months. The information displayed on this report is compiled from pharmacy submissions to the Department, and accurately reflects the information as submitted by the pharmacies.  This report was requested by: Jearld Adjutant   Reference #: [id number]   Others' Prescriptions  Patient Name: Malicia Blasdel   Birth Date:  1975-05-19   Address: [place],  [address]   Sex: Female   Rx Written Rx Dispensed Drug Quantity Days Supply Prescriber Name Prescriber [place] # Payment Method Dispenser   02/08/2021 02/09/2021 tramadol hcl 50 mg tablet  42 [place] [id number] [place]. #02   01/18/2021 01/19/2021 tramadol hcl 50 mg tablet  42 14 Darnelle Spangle [id number] [place]. #02   12/26/2020 12/27/2020 tramadol hcl 50 mg tablet  42 14 Darnelle Spangle [id number] [place]. #02   12/13/2020 12/19/2020 alprazolam 1 mg tablet  15 35 Reginold Agent [id number] [place]. #19   11/27/2020 12/01/2020 tramadol hcl 50 mg tablet  [place], [place] [id number] Insurance [employer] #02   10/31/2020 11/01/2020 tramadol hcl 50 mg tablet  [address] Reginold Agent [id number] [employer] #02   10/03/2020 10/09/2020 tramadol hcl 50 mg tablet  42 14 Darnelle Spangle [id number] [employer] #02   09/28/2020 10/04/2020 diphenoxylate-atropine 2.5-0.025 mg tablet  979 30 Reginold Agent [id number] [employer] #19   08/31/2020 09/05/2020 alprazolam 1 mg tablet  15 35 Reginold Agent [id number] [employer] #19   08/31/2020 09/04/2020 tramadol hcl 50 mg tablet  [address] Reginold Agent [id number] [place]. #02   08/01/2020 08/01/2020 tramadol hcl 50 mg tablet  [address] Reginold Agent [id number] [place]. #02   07/31/2020 07/31/2020 acetaminophen-codeine 120 mg-12 mg/5 ml solution  114m 6 MJohny Drilling GDarnell Level(MD) FEl[place] #02   07/10/2020 07/10/2020 tramadol hcl 50 mg tablet  42 14 CReginold [id number] #02   06/12/2020 06/15/2020 alprazolam 1 mg tablet  15 35 CReginold [id number]  #19   06/15/2020 06/15/2020 tramadol hcl 50 mg tablet  42 14 MJohny Drilling GDarnell Level(MD) FSioux Center #02   05/07/2020 05/09/2020 tramadol hcl 50 mg tablet  46814 MJohny Drilling GDarnell Level(MD) FSeneca #02   03/14/2020 03/16/2020 alprazolam 1 mg tablet  15 35 CReginold [id number] #19     * - Drugs marked with an asterisk are compound drugs. If the compound drug is made up of more than one controlled substance, then each controlled substance will be a separate row in the table.

## 2021-03-02 ENCOUNTER — Other Ambulatory Visit: Payer: Self-pay | Admitting: Primary Care

## 2021-03-02 DIAGNOSIS — F603 Borderline personality disorder: Secondary | ICD-10-CM

## 2021-03-02 DIAGNOSIS — F319 Bipolar disorder, unspecified: Secondary | ICD-10-CM

## 2021-03-02 MED ORDER — DULOXETINE HCL 60 MG PO CPEP *I*
60.0000 mg | DELAYED_RELEASE_CAPSULE | Freq: Every day | ORAL | 3 refills | Status: DC
Start: 2021-03-02 — End: 2021-03-09

## 2021-03-02 MED ORDER — QUETIAPINE FUMARATE 50 MG PO TB24 *I*
50.0000 mg | ORAL_TABLET | Freq: Every evening | ORAL | 3 refills | Status: DC
Start: 2021-03-02 — End: 2021-04-27

## 2021-03-02 NOTE — Telephone Encounter (Signed)
Last office visit:   01/22/2021  Patients upcoming appointments:  Future Appointments   Date Time Provider [place]   03/07/2021  2:00 PM Nordquist, Janett Billow, RN [place] None   03/26/2021 10:30 AM Gae Dry, NP [facility] None   04/13/2021  2:00 PM [name], MD [facility] None   10/15/2021  2:00 PM [name], MD [facility] None       Recent Lab Values 10/12/2020 04/14/2019 01/04/2018 12/17/2017 10/17/2017 10/17/2017 03/31/2017   EXP DATE - 2019-08-04 9/20 02/02/2018 11-03-18 08-04-18 04/04/18   UREMK See Text - - - - - -   THCU POS - - - - - -       Recent Lab results:  GENERAL CHEMISTRY   Recent Labs     01/29/21  1427 10/12/20  1255 04/25/20  1637   NA 142 142 140   K 4.5 4.2 4.5   CL 106 108 104   CO2 _0 GAP _1 UN _2 CREAT 0.96* 0.91 0.97*   GFRC  --   --  70   GFRB  --   --  81   GLU 80 74 79   CA 9.7 9.5 9.5      LIPID PROFILE   Recent Labs     04/25/20  1637   CHOL 122   TRIG 92   HDL 38*   LDLC 66      LIVER PROFILE   Recent Labs     01/29/21  1427 10/12/20  1255 04/25/20  1637   ALT 49* 19 25   AST _3 ALK 101 87 105   TB 0.4 0.3 0.4      DIABETES THYROID   Recent Labs     10/12/20  1255 04/25/20  1637   HA1C 6.0* 6.3*    Recent Labs     10/12/20  1255 04/25/20  1637   TSH 1.48 1.03         Pending/Orders Labs:  Lab Frequency Next Occurrence   COVID-19 PCR Once 10/24/2020

## 2021-03-02 NOTE — Telephone Encounter (Signed)
Last office visit:   01/22/2021  Patients upcoming appointments:  Future Appointments   Date Time Provider [place]   03/07/2021  2:00 PM Nordquist, Janett Billow, RN [place] None   03/26/2021 10:30 AM Gae Dry, NP [facility] None   04/13/2021  2:00 PM [name], MD [facility] None   10/15/2021  2:00 PM [name], MD [facility] None       Recent Lab Values 10/12/2020 04/14/2019 01/04/2018 12/17/2017 10/17/2017 10/17/2017 03/31/2017   EXP DATE - 2019-08-04 9/20 02/02/2018 11-03-18 08-04-18 04/04/18   UREMK See Text - - - - - -   THCU POS - - - - - -       Recent Lab results:  GENERAL CHEMISTRY   Recent Labs     01/29/21  1427 10/12/20  1255 04/25/20  1637   NA 142 142 140   K 4.5 4.2 4.5   CL 106 108 104   CO2 _0 GAP _1 UN _2 CREAT 0.96* 0.91 0.97*   GFRC  --   --  70   GFRB  --   --  81   GLU 80 74 79   CA 9.7 9.5 9.5      LIPID PROFILE   Recent Labs     04/25/20  1637   CHOL 122   TRIG 92   HDL 38*   LDLC 66      LIVER PROFILE   Recent Labs     01/29/21  1427 10/12/20  1255 04/25/20  1637   ALT 49* 19 25   AST _3 ALK 101 87 105   TB 0.4 0.3 0.4      DIABETES THYROID   Recent Labs     10/12/20  1255 04/25/20  1637   HA1C 6.0* 6.3*    Recent Labs     10/12/20  1255 04/25/20  1637   TSH 1.48 1.03         Pending/Orders Labs:  Lab Frequency Next Occurrence   COVID-19 PCR Once 10/24/2020

## 2021-03-02 NOTE — Telephone Encounter (Signed)
Last office visit:   01/22/2021  Patients upcoming appointments:  Future Appointments   Date Time Provider [place]   03/07/2021  2:00 PM Nordquist, Janett Billow, RN [place] None   03/26/2021 10:30 AM Gae Dry, NP [facility] None   04/13/2021  2:00 PM [name], MD [facility] None   10/15/2021  2:00 PM [name], MD [facility] None       Recent Lab Values 10/12/2020 04/14/2019 01/04/2018 12/17/2017 10/17/2017 10/17/2017 03/31/2017   EXP DATE - 2019-08-04 9/20 02/02/2018 11-03-18 08-04-18 04/04/18   UREMK See Text - - - - - -   THCU POS - - - - - -       Recent Lab results:  GENERAL CHEMISTRY   Recent Labs     01/29/21  1427 10/12/20  1255 04/25/20  1637   NA 142 142 140   K 4.5 4.2 4.5   CL 106 108 104   CO2 '24 23 24   ' GAP '12 11 12   ' UN '15 13 10   ' CREAT 0.96* 0.91 0.97*   GFRC  --   --  70   GFRB  --   --  81   GLU 80 74 79   CA 9.7 9.5 9.5      LIPID PROFILE   Recent Labs     04/25/20  1637   CHOL 122   TRIG 92   HDL 38*   LDLC 66      LIVER PROFILE   Recent Labs     01/29/21  1427 10/12/20  1255 04/25/20  1637   ALT 49* 19 25   AST '24 17 18   ' ALK 101 87 105   TB 0.4 0.3 0.4      DIABETES THYROID   Recent Labs     10/12/20  1255 04/25/20  1637   HA1C 6.0* 6.3*    Recent Labs     10/12/20  1255 04/25/20  1637   TSH 1.48 1.03         Pending/Orders Labs:  Lab Frequency Next Occurrence   COVID-19 PCR Once 10/24/2020      Patient Name: Kaylee Harris   Birth Date: 07/09/1975   Address: [place], [place] [address]   Sex: Female   Rx Written Rx Dispensed Drug Quantity Days Supply Prescriber Name Prescriber Dea # Payment Method Dispenser   02/08/2021 02/09/2021 tramadol hcl 50 mg tablet  42 [place], [place]. #02   01/18/2021 01/19/2021 tramadol hcl 50 mg tablet  42 14 Phillips, [place]. #02   12/26/2020 12/27/2020 tramadol hcl 50 mg tablet  42 14 Phillips, [place]. #02   12/13/2020 12/19/2020  alprazolam 1 mg tablet  15 35 Reginold Agent [id number] [place]. #19   11/27/2020 12/01/2020 tramadol hcl 50 mg tablet  42 14 Reginold Agent [id number] [place]. #02   10/31/2020 11/01/2020 tramadol hcl 50 mg tablet  42 14 Reginold Agent [id number] [place] #02   10/03/2020 10/09/2020 tramadol hcl 50 mg tablet  42 14 Darnelle Spangle [id number] [place]. #02   09/28/2020 10/04/2020 diphenoxylate-atropine 2.5-0.025 mg tablet  628 30 Reginold Agent [id number] [place]. #19   08/31/2020 09/05/2020 alprazolam 1 mg tablet  15 35 Reginold Agent [id number] [place]. #19   08/31/2020 09/04/2020  tramadol hcl 50 mg tablet  [address] Reginold Agent [id number] [place] #02   08/01/2020 08/01/2020 tramadol hcl 50 mg tablet  [address] Reginold Agent [id number] [place]. #02   07/31/2020 07/31/2020 acetaminophen-codeine 120 mg-12 mg/5 ml solution  120m 6 MJohny Drilling GDarnell Level(MD) FLinn Creek #02   07/10/2020 07/10/2020 tramadol hcl 50 mg tablet  42 14 CReginold [id number] #02   06/12/2020 06/15/2020 alprazolam 1 mg tablet  15 35 CReginold [id number] #19   06/15/2020 06/15/2020 tramadol hcl 50 mg tablet  42 14 MJohny Drilling GDarnell Level(MD) FWaggaman #02   05/07/2020 05/09/2020 tramadol hcl 50 mg tablet  42 14 MJohny Drilling GDarnell Level(MD) FMcVille #02   03/14/2020 03/16/2020 alprazolam 1 mg tablet  15 35 CReginold [id number] Heights #19

## 2021-03-02 NOTE — Telephone Encounter (Signed)
Last office visit:   01/22/2021  Patients upcoming appointments:  Future Appointments   Date Time Provider Department Center   03/07/2021  2:00 PM Nordquist, Sally C, RN [facility] None   03/26/2021 10:30 AM Culhane, Carla, NP [facility] None   04/13/2021  2:00 PM Moore, Jillian, MD [facility] None   10/15/2021  2:00 PM Moore, Jillian, MD [facility] None       Recent Lab Values 10/12/2020 04/14/2019 01/04/2018 12/17/2017 10/17/2017 10/17/2017 03/31/2017   EXP DATE - 2019-08-04 9/20 02/02/2018 11-03-18 08-04-18 04/04/18   UREMK See Text - - - - - -   THCU POS - - - - - -       Recent Lab results:  GENERAL CHEMISTRY   Recent Labs     01/29/21  1427 10/12/20  1255 04/25/20  1637   NA 142 142 140   K 4.5 4.2 4.5   CL 106 108 104   CO2 24 23 24   GAP 12 11 12   UN 15 13 10   CREAT 0.96* 0.91 0.97*   GFRC  --   --  70   GFRB  --   --  81   GLU 80 74 79   CA 9.7 9.5 9.5      LIPID PROFILE   Recent Labs     04/25/20  1637   CHOL 122   TRIG 92   HDL 38*   LDLC 66      LIVER PROFILE   Recent Labs     01/29/21  1427 10/12/20  1255 04/25/20  1637   ALT 49* 19 25   AST 24 17 18   ALK 101 87 105   TB 0.4 0.3 0.4      DIABETES THYROID   Recent Labs     10/12/20  1255 04/25/20  1637   HA1C 6.0* 6.3*    Recent Labs     10/12/20  1255 04/25/20  1637   TSH 1.48 1.03         Pending/Orders Labs:  Lab Frequency Next Occurrence   COVID-19 PCR Once 10/24/2020

## 2021-03-06 ENCOUNTER — Other Ambulatory Visit: Payer: Self-pay | Admitting: Registered Nurse

## 2021-03-06 DIAGNOSIS — Z794 Long term (current) use of insulin: Secondary | ICD-10-CM

## 2021-03-07 ENCOUNTER — Ambulatory Visit: Payer: Medicare (Managed Care) | Attending: Nutrition | Admitting: Registered Nurse

## 2021-03-07 ENCOUNTER — Other Ambulatory Visit: Payer: Self-pay

## 2021-03-07 DIAGNOSIS — Z794 Long term (current) use of insulin: Secondary | ICD-10-CM | POA: Insufficient documentation

## 2021-03-07 DIAGNOSIS — E119 Type 2 diabetes mellitus without complications: Secondary | ICD-10-CM | POA: Insufficient documentation

## 2021-03-07 NOTE — Progress Notes (Signed)
Kaylee Harris 45 y.o. female was seen today for diabetes education regarding  uncontrolled type 2 diabetes mellitus. Last visit 09/08/2019 for DSMT;  Omnipod DASH pump + DExcom G6 CGM. Post pump + CGM fu today.         Recent Patient Data     No data found in the last 1 encounters.          Diabetes Meds: reviewed in eRecord; patient is taking as prescribed. Trulicity 2.02 mg q week; Humalog vial      Lab Results   Component Value Date    HA1C 6.0 (H) 10/12/2020    HA1C 6.3 (H) 04/25/2020    HA1C 6.3 (H) 11/01/2019    MALBR <1.20 04/25/2020    CREAT 0.96 (H) 01/29/2021    LDLC 66 04/25/2020     Self glucose monitoring:    BG meter as needed; RT CGM . Last 14 Days average 119; SG trends stable overnight.   BG today:  bg 111;  SG 89 mg/dl;    dexcom CGM: average 119  TIR = 91%  1% = <70  4% = >180  1%  = 240  1% = <70    -- Current management: Omnipod DASH Insulin Pump  -- Insulin pump settings:  -- Basal rates:  12 AM - 0.80 units/hour   3 AM - 1.15 units/hour  6 AM - 1.20 units/hour   12pm 1.00 units/hr   4pm -  0.95 units/hr   10 PM - 0.80  units/hour   Total basal 24.35;  units   TDD 52.30 units   Basal 85% ; bolus 15%  -- Active insulin time: 4 hours  -- Bolus wizard use: yes  -- Insulin:carb ratio: 63mn 1:13 grams   -- Sensitivity: 40 mg/dL  -- Target blood glucose: 140 mg/dL  -- Glucose sensor: G6  Low 70; high 250      Diet: 1-2 meals per day wt loss today at home 223.8 lbs (healthy snacks)  (previously 300 lbs)   Smoking: no Alcohol: no rare     Self foot check:  self care  Eye Exam inquiry: done    Assessment:   Describes proper skill with insert PODs for pump + rotates sites  Goal to fill POD to 1.2 units vs 2.0 as wasting insulin  Tolerating low dose GLP1 with weight loss & reduced appetite; eating 1 to 2 x day  CGM is not accurate 1 to 2 days (dexcom) & she self checks bgs with meter instead  Omnipod pump emergency back up plan established; prefers to use old Omnipod pump & has supplies  Pt received COVID  19 vaccine YES      Coun/Edu:     BARRIERS THAT AFFECT LEARNING:  Mental health ; followed by psych  READINESS TO LEARN:  good  PREFERRED METHODS OF LEARNING:  reading, demonstration and visual tools  EDUCATION PROVIDED: insulin teaching, BG Goals, using ICR and CF to determine meal bolus and  risk reduction; skin care with PODS; post pump FU  & personal CGM prob solving    Plan:     Patient Instructions   1.  Confirm finger blood sugar when SG indicates < 70  2    treat by finger value  3   continue skin barrier wipe for dexcom cgm use    DSMT SUPPORT PLAN:  Take advantage of your annual Medicare benefits for DSMT and MNT  Follow-up visit:  12 mos  Time spent  counseling patient: 60 minutes    MCR insurance MD follow up referral is in eRecord    Allowed 2 hrs Individual/group DSMT 1   hrs remain; expires 05/05/2021    Janett Billow Charity Tessier, RN CDCES

## 2021-03-07 NOTE — Patient Instructions (Signed)
Confirm finger blood sugar when SG indicates < 70  2    treat by finger value  3   continue skin barrier wipe for dexcom cgm use

## 2021-03-09 ENCOUNTER — Other Ambulatory Visit: Payer: Self-pay

## 2021-03-09 DIAGNOSIS — F319 Bipolar disorder, unspecified: Secondary | ICD-10-CM

## 2021-03-09 DIAGNOSIS — J45909 Unspecified asthma, uncomplicated: Secondary | ICD-10-CM

## 2021-03-09 DIAGNOSIS — F603 Borderline personality disorder: Secondary | ICD-10-CM

## 2021-03-09 MED ORDER — FLUTICASONE PROPIONATE HFA 220 MCG/ACT IN AERO *I*
1.0000 | INHALATION_SPRAY | Freq: Every day | RESPIRATORY_TRACT | 2 refills | Status: DC
Start: 2021-03-09 — End: 2021-04-27

## 2021-03-09 MED ORDER — DULOXETINE HCL 60 MG PO CPEP *I*
60.0000 mg | DELAYED_RELEASE_CAPSULE | Freq: Every day | ORAL | 3 refills | Status: DC
Start: 2021-03-09 — End: 2021-04-05

## 2021-03-09 NOTE — Telephone Encounter (Signed)
Shayla from [facility] called and states that in addition to the patient's Flovent, they also need a script for the patient's Duloxetine. I informed Shayla the Duloxetine was already sent ot [facility]. Shayla states that they have a record of the prescription being sent but it is still being pended by the provider's office. Please advise         Last office visit:   01/22/2021  Patients upcoming appointments:  Future Appointments   Date Time Provider Department Center   03/26/2021 10:30 AM Culhane, Carla, NP [facility] None   04/13/2021  2:00 PM Moore, Jillian, MD CA[facility] None   10/15/2021  2:00 PM Moore, Jillian, MD [facility] None       Recent Lab Values 10/12/2020 04/14/2019 01/04/2018 12/17/2017 10/17/2017 10/17/2017 03/31/2017   EXP DATE - 2019-08-04 9/20 02/02/2018 11-03-18 08-04-18 04/04/18   UREMK See Text - - - - - -   THCU POS - - - - - -       Recent Lab results:  GENERAL CHEMISTRY   Recent Labs     01/29/21  1427 10/12/20  1255 04/25/20  1637   NA 142 142 140   K 4.5 4.2 4.5   CL 106 108 104   CO2 24 23 24   GAP 12 11 12   UN 15 13 10   CREAT 0.96* 0.91 0.97*   GFRC  --   --  70   GFRB  --   --  81   GLU 80 74 79   CA 9.7 9.5 9.5      LIPID PROFILE   Recent Labs     04/25/20  1637   CHOL 122   TRIG 92   HDL 38*   LDLC 66      LIVER PROFILE   Recent Labs     01/29/21  1427 10/12/20  1255 04/25/20  1637   ALT 49* 19 25   AST 24 17 18   ALK 101 87 105   TB 0.4 0.3 0.4      DIABETES THYROID   Recent Labs     10/12/20  1255 04/25/20  1637   HA1C 6.0* 6.3*    Recent Labs     10/12/20  1255 04/25/20  1637   TSH 1.48 1.03         Pending/Orders Labs:  Lab Frequency Next Occurrence   COVID-19 PCR Once 10/24/2020

## 2021-03-19 ENCOUNTER — Other Ambulatory Visit: Payer: Self-pay

## 2021-03-19 ENCOUNTER — Other Ambulatory Visit: Payer: Self-pay | Admitting: Psychiatry

## 2021-03-19 ENCOUNTER — Other Ambulatory Visit: Payer: Self-pay | Admitting: Internal Medicine

## 2021-03-19 ENCOUNTER — Other Ambulatory Visit: Payer: Self-pay | Admitting: Primary Care

## 2021-03-19 DIAGNOSIS — Z760 Encounter for issue of repeat prescription: Secondary | ICD-10-CM

## 2021-03-19 DIAGNOSIS — E1165 Type 2 diabetes mellitus with hyperglycemia: Secondary | ICD-10-CM

## 2021-03-19 MED ORDER — OMNIPOD DASH PODS (GEN 4) MISC
1.0000 | 3 refills | Status: DC | PRN
Start: 2021-03-19 — End: 2021-03-26

## 2021-03-19 MED ORDER — METRONIDAZOLE 0.75 % EX GEL *I*
Freq: Two times a day (BID) | CUTANEOUS | 5 refills | Status: AC
Start: 2021-03-19 — End: ?

## 2021-03-19 NOTE — Telephone Encounter (Signed)
Confidential Drug Report  Search Terms: Temeka Pore, 06-07-1975   Search Date: 03/19/2021 10:13:07 AM   Searching on behalf of: [id number] - [name]   The Drug Utilization Report below displays all of the controlled substance prescriptions, if any, that your patient has filled in the last twelve months. The information displayed on this report is compiled from pharmacy submissions to the Department, and accurately reflects the information as submitted by the pharmacies.  This report was requested by: Denton Meek   Reference #: [id number]   Others' Prescriptions  Patient Name: Kaylee Harris   Birth Date: [birth year]   Address: [place], [place] [address]   Sex: Female   Rx Written Rx Dispensed Drug Quantity Days Supply Prescriber Name Prescriber [place] # Payment Method Dispenser   03/01/2021 03/03/2021 tramadol hcl 50 mg tablet  42 14 Phillips, [place]. #02   02/08/2021 02/09/2021 tramadol hcl 50 mg tablet  42 14 [place], [place]. #02   01/18/2021 01/19/2021 tramadol hcl 50 mg tablet  42 14 [place], [place]. #02   12/26/2020 12/27/2020 tramadol hcl 50 mg tablet  42 14 Darnelle Spangle [id number] [place]. #02   12/13/2020 12/19/2020 alprazolam 1 mg tablet  15 35 Reginold Agent [id number] [place]. #19   11/27/2020 12/01/2020 tramadol hcl 50 mg tablet  42 14 Reginold Agent [id number] [place]. #02   10/31/2020 11/01/2020 tramadol hcl 50 mg tablet  42 14 Reginold Agent [id number] [place]. #02   10/03/2020 10/09/2020 tramadol hcl 50 mg tablet  42 14 Darnelle Spangle [id number] [place]. #02   09/28/2020 10/04/2020 diphenoxylate-atropine 2.5-0.025 mg tablet  211 30 Reginold Agent [id number] [place]. #19   08/31/2020 09/05/2020  alprazolam 1 mg tablet  15 35 Reginold Agent [id number] Insurance [place]. #19   08/31/2020 09/04/2020 tramadol hcl 50 mg tablet  42 14 Reginold Agent [id number] [place]. #02   08/01/2020 08/01/2020 tramadol hcl 50 mg tablet  42 14 Reginold Agent [id number] [place]. #02   07/31/2020 07/31/2020 acetaminophen-codeine 120 mg-12 mg/5 ml solution  139m 6 MJohny Drilling GDarnell Level(MD) FBridge[place] #02   07/10/2020 07/10/2020 tramadol hcl 50 mg tablet  42 14 CReginold [id number] #02   06/12/2020 06/15/2020 alprazolam 1 mg tablet  15 35 CReginold [id number] #19   06/15/2020 06/15/2020 tramadol hcl 50 mg tablet  42 14 MJohny Drilling GDarnell Level(MD) FBel[place] #02   05/07/2020 05/09/2020 tramadol hcl 50 mg tablet  [id number] MJohny Drilling GDarnell Level(MD) FCottonwood Falls #02     * - Drugs marked with an asterisk are compound drugs. If the compound drug is made up of more than one controlled substance, then each controlled substance will be a separate row in the table.       Last office visit:   01/22/2021  Patients upcoming appointments:  Future Appointments   Date Time Provider [place]  03/26/2021 10:30 AM CGae Dry NP [facility] None   04/13/2021  2:00 PM MJohny Drilling MD [facility] None   10/15/2021  2:00 PM MJohny Drilling MD [facility] None       Recent  Lab Values 10/12/2020 04/14/2019 01/04/2018 12/17/2017 10/17/2017 10/17/2017 03/31/2017   EXP DATE - 2019-08-04 9/20 02/02/2018 11-03-18 08-04-18 04/04/18   UREMK See Text - - - - - -   THCU POS - - - - - -       Recent Lab results:  GENERAL CHEMISTRY   Recent Labs     01/29/21  1427 10/12/20  1255 04/25/20  1637   NA 142 142 140   K 4.5 4.2 4.5   CL 106 108 104   CO2 '24 23 24   ' GAP '12 11 12   ' UN '15 13 10   ' CREAT 0.96* 0.91 0.97*   GFRC  --   --  70   GFRB   --   --  81   GLU 80 74 79   CA 9.7 9.5 9.5      LIPID PROFILE   Recent Labs     04/25/20  1637   CHOL 122   TRIG 92   HDL 38*   LDLC 66      LIVER PROFILE   Recent Labs     01/29/21  1427 10/12/20  1255 04/25/20  1637   ALT 49* 19 25   AST '24 17 18   ' ALK 101 87 105   TB 0.4 0.3 0.4      DIABETES THYROID   Recent Labs     10/12/20  1255 04/25/20  1637   HA1C 6.0* 6.3*    Recent Labs     10/12/20  1255 04/25/20  1637   TSH 1.48 1.03         Pending/Orders Labs:  Lab Frequency Next Occurrence   COVID-19 PCR Once 10/24/2020

## 2021-03-19 NOTE — Telephone Encounter (Signed)
Last office visit:   01/22/2021  Patients upcoming appointments:  Future Appointments   Date Time Provider [place]   03/26/2021 10:30 AM Gae Dry, NP [facility] None   04/13/2021  2:00 PM [name], MD [facility] None   10/15/2021  2:00 PM [name], MD [facility] None       Recent Lab Values 10/12/2020 04/14/2019 01/04/2018 12/17/2017 10/17/2017 10/17/2017 03/31/2017   EXP DATE - 2019-08-04 9/20 02/02/2018 11-03-18 08-04-18 04/04/18   UREMK See Text - - - - - -   THCU POS - - - - - -       Recent Lab results:  GENERAL CHEMISTRY   Recent Labs     01/29/21  1427 10/12/20  1255 04/25/20  1637   NA 142 142 140   K 4.5 4.2 4.5   CL 106 108 104   CO2 '24 23 24   ' GAP '12 11 12   ' UN '15 13 10   ' CREAT 0.96* 0.91 0.97*   GFRC  --   --  70   GFRB  --   --  81   GLU 80 74 79   CA 9.7 9.5 9.5      LIPID PROFILE   Recent Labs     04/25/20  1637   CHOL 122   TRIG 92   HDL 38*   LDLC 66      LIVER PROFILE   Recent Labs     01/29/21  1427 10/12/20  1255 04/25/20  1637   ALT 49* 19 25   AST '24 17 18   ' ALK 101 87 105   TB 0.4 0.3 0.4      DIABETES THYROID   Recent Labs     10/12/20  1255 04/25/20  1637   HA1C 6.0* 6.3*    Recent Labs     10/12/20  1255 04/25/20  1637   TSH 1.48 1.03         Pending/Orders Labs:  Lab Frequency Next Occurrence   COVID-19 PCR Once 10/24/2020

## 2021-03-20 ENCOUNTER — Other Ambulatory Visit: Payer: Self-pay | Admitting: Primary Care

## 2021-03-21 ENCOUNTER — Telehealth: Payer: Self-pay

## 2021-03-21 ENCOUNTER — Other Ambulatory Visit: Payer: Self-pay | Admitting: Obstetrics and Gynecology

## 2021-03-21 DIAGNOSIS — S31109A Unspecified open wound of abdominal wall, unspecified quadrant without penetration into peritoneal cavity, initial encounter: Secondary | ICD-10-CM

## 2021-03-21 DIAGNOSIS — F419 Anxiety disorder, unspecified: Secondary | ICD-10-CM

## 2021-03-21 DIAGNOSIS — K219 Gastro-esophageal reflux disease without esophagitis: Secondary | ICD-10-CM

## 2021-03-21 NOTE — Telephone Encounter (Signed)
Patient called and states she will be moving out of town soon and would like to know if she can have 90 day supplies of all her medications before she moves, since she has not yet been able to establish care with a new provider. Please advise

## 2021-03-21 NOTE — Telephone Encounter (Signed)
Last office visit:   01/22/2021  Patients upcoming appointments:  Future Appointments   Date Time Provider [place]   03/26/2021 10:30 AM Gae Dry, NP [facility] None   04/13/2021  2:00 PM [name], MD [facility] None   10/15/2021  2:00 PM [name], MD [facility] None       Recent Lab Values 10/12/2020 04/14/2019 01/04/2018 12/17/2017 10/17/2017 10/17/2017 03/31/2017   EXP DATE - 2019-08-04 9/20 02/02/2018 11-03-18 08-04-18 04/04/18   UREMK See Text - - - - - -   THCU POS - - - - - -       Recent Lab results:  GENERAL CHEMISTRY   Recent Labs     01/29/21  1427 10/12/20  1255 04/25/20  1637   NA 142 142 140   K 4.5 4.2 4.5   CL 106 108 104   CO2 '24 23 24   ' GAP '12 11 12   ' UN '15 13 10   ' CREAT 0.96* 0.91 0.97*   GFRC  --   --  70   GFRB  --   --  81   GLU 80 74 79   CA 9.7 9.5 9.5      LIPID PROFILE   Recent Labs     04/25/20  1637   CHOL 122   TRIG 92   HDL 38*   LDLC 66      LIVER PROFILE   Recent Labs     01/29/21  1427 10/12/20  1255 04/25/20  1637   ALT 49* 19 25   AST '24 17 18   ' ALK 101 87 105   TB 0.4 0.3 0.4      DIABETES THYROID   Recent Labs     10/12/20  1255 04/25/20  1637   HA1C 6.0* 6.3*    Recent Labs     10/12/20  1255 04/25/20  1637   TSH 1.48 1.03         Pending/Orders Labs:  Lab Frequency Next Occurrence   COVID-19 PCR Once 10/24/2020

## 2021-03-22 MED ORDER — SUMATRIPTAN SUCCINATE 50 MG PO TABS *I*
ORAL_TABLET | ORAL | 1 refills | Status: DC
Start: 2021-03-22 — End: 2021-04-27

## 2021-03-22 MED ORDER — ATORVASTATIN CALCIUM 40 MG PO TABS *I*
40.0000 mg | ORAL_TABLET | Freq: Every day | ORAL | 1 refills | Status: DC
Start: 2021-03-22 — End: 2021-04-27

## 2021-03-22 MED ORDER — FAMOTIDINE 20 MG PO TABS *I*
ORAL_TABLET | ORAL | 1 refills | Status: DC
Start: 2021-03-22 — End: 2021-04-27

## 2021-03-22 MED ORDER — CLOTRIMAZOLE 1 % EX CREA *I*
TOPICAL_CREAM | Freq: Two times a day (BID) | CUTANEOUS | 3 refills | Status: AC
Start: 2021-03-22 — End: ?

## 2021-03-22 MED ORDER — PANTOPRAZOLE SODIUM 40 MG PO TBEC *I*
40.0000 mg | DELAYED_RELEASE_TABLET | Freq: Every day | ORAL | 1 refills | Status: DC
Start: 2021-03-22 — End: 2021-04-27

## 2021-03-22 MED ORDER — METOPROLOL SUCCINATE 25 MG PO TB24 *I*
25.0000 mg | ORAL_TABLET | Freq: Every day | ORAL | 1 refills | Status: DC
Start: 2021-03-22 — End: 2021-04-27

## 2021-03-22 MED ORDER — ASCORBIC ACID 500 MG PO TABS *I*
500.0000 mg | ORAL_TABLET | Freq: Every day | ORAL | 1 refills | Status: DC
Start: 2021-03-22 — End: 2021-04-27

## 2021-03-22 MED ORDER — BUSPIRONE HCL 30 MG PO TABS *I*
30.0000 mg | ORAL_TABLET | Freq: Two times a day (BID) | ORAL | 1 refills | Status: DC
Start: 2021-03-22 — End: 2021-04-27

## 2021-03-22 MED ORDER — FAMCICLOVIR 500 MG PO TABS *I*
500.0000 mg | ORAL_TABLET | Freq: Three times a day (TID) | ORAL | 1 refills | Status: AC | PRN
Start: 2021-03-22 — End: ?

## 2021-03-22 MED ORDER — PROMETHAZINE HCL 25 MG PO TABS *I*
25.0000 mg | ORAL_TABLET | ORAL | 1 refills | Status: DC | PRN
Start: 2021-03-22 — End: 2021-04-27

## 2021-03-22 MED ORDER — FLUTICASONE PROPIONATE 50 MCG/ACT NA SUSP *I*
1.0000 | Freq: Every day | NASAL | 1 refills | Status: DC
Start: 2021-03-22 — End: 2021-04-27

## 2021-03-22 MED ORDER — TRIAMCINOLONE ACETONIDE 0.025 % EX OINT *I*
TOPICAL_OINTMENT | Freq: Two times a day (BID) | CUTANEOUS | 1 refills | Status: AC
Start: 2021-03-22 — End: ?

## 2021-03-22 MED ORDER — TOPIRAMATE 100 MG PO TABS *I*
100.0000 mg | ORAL_TABLET | Freq: Two times a day (BID) | ORAL | 1 refills | Status: DC
Start: 2021-03-22 — End: 2021-04-27

## 2021-03-22 NOTE — Telephone Encounter (Signed)
Scripts that have not been recently refilled in the last 2 weeks have been pended.         Last office visit:   01/22/2021  Patients upcoming appointments:  Future Appointments   Date Time Provider [place]   03/26/2021 10:30 AM Gae Dry, NP [facility] None   04/13/2021  2:00 PM [name], MD [facility] None   10/15/2021  2:00 PM [name], MD [facility] None       Recent Lab Values 10/12/2020 04/14/2019 01/04/2018 12/17/2017 10/17/2017 10/17/2017 03/31/2017   EXP DATE - 2019-08-04 9/20 02/02/2018 11-03-18 08-04-18 04/04/18   UREMK See Text - - - - - -   THCU POS - - - - - -       Recent Lab results:  GENERAL CHEMISTRY   Recent Labs     01/29/21  1427 10/12/20  1255 04/25/20  1637   NA 142 142 140   K 4.5 4.2 4.5   CL 106 108 104   CO2 '24 23 24   ' GAP '12 11 12   ' UN '15 13 10   ' CREAT 0.96* 0.91 0.97*   GFRC  --   --  70   GFRB  --   --  81   GLU 80 74 79   CA 9.7 9.5 9.5      LIPID PROFILE   Recent Labs     04/25/20  1637   CHOL 122   TRIG 92   HDL 38*   LDLC 66      LIVER PROFILE   Recent Labs     01/29/21  1427 10/12/20  1255 04/25/20  1637   ALT 49* 19 25   AST '24 17 18   ' ALK 101 87 105   TB 0.4 0.3 0.4      DIABETES THYROID   Recent Labs     10/12/20  1255 04/25/20  1637   HA1C 6.0* 6.3*    Recent Labs     10/12/20  1255 04/25/20  1637   TSH 1.48 1.03         Pending/Orders Labs:  Lab Frequency Next Occurrence   COVID-19 PCR Once 10/24/2020

## 2021-03-22 NOTE — Telephone Encounter (Signed)
rx sent

## 2021-03-22 NOTE — Telephone Encounter (Signed)
Please prepare/pend orders for 30d supply

## 2021-03-22 NOTE — Telephone Encounter (Signed)
Of course; does  she want 90d sent now? I can also include 1 refill or send to new pharmacy when she moves  Please wish her well for me and let her and her wife know I will miss them!

## 2021-03-22 NOTE — Telephone Encounter (Signed)
I spoke to the patient and wished her well and she said thank you. Kaylee Harris would like one refill of her medications sent now to [facility]. After she has moved and knows what pharmacy pharmacy she would like her medication sent to, she will call us for the 90 day supply.

## 2021-03-26 ENCOUNTER — Encounter: Payer: Medicare (Managed Care) | Admitting: Internal Medicine

## 2021-03-26 ENCOUNTER — Encounter: Payer: Self-pay | Admitting: Endocrinology

## 2021-03-26 DIAGNOSIS — E1165 Type 2 diabetes mellitus with hyperglycemia: Secondary | ICD-10-CM

## 2021-03-26 MED ORDER — OMNIPOD DASH PODS (GEN 4) MISC
1.0000 | 3 refills | Status: AC | PRN
Start: 2021-03-26 — End: ?

## 2021-03-27 ENCOUNTER — Other Ambulatory Visit: Payer: Self-pay | Admitting: Primary Care

## 2021-03-27 NOTE — Progress Notes (Signed)
No SHOW

## 2021-03-30 ENCOUNTER — Other Ambulatory Visit: Payer: Self-pay | Admitting: Family Medicine

## 2021-03-30 ENCOUNTER — Encounter: Payer: Self-pay | Admitting: Endocrinology

## 2021-03-30 DIAGNOSIS — E1165 Type 2 diabetes mellitus with hyperglycemia: Secondary | ICD-10-CM

## 2021-03-30 MED ORDER — INSULIN LISPRO (HUMAN) 100 UNIT/ML IJ/SC SOLN *WRAPPED*
SUBCUTANEOUS | 3 refills | Status: AC
Start: 2021-03-30 — End: ?

## 2021-04-02 NOTE — Telephone Encounter (Signed)
Last office visit:   01/22/2021  Patients upcoming appointments:  Future Appointments   Date Time Provider Department [place]   04/13/2021  2:00 PM Moore, Jillian, MD CAF None   10/15/2021  2:00 PM Moore, Jillian, MD CAF None       Recent Lab Values 10/12/2020 04/14/2019 01/04/2018 12/17/2017 10/17/2017 10/17/2017 03/31/2017   EXP DATE - 2019-08-04 9/20 02/02/2018 11-03-18 08-04-18 04/04/18   UREMK See Text - - - - - -   THCU POS - - - - - -       Recent Lab results:  GENERAL CHEMISTRY   Recent Labs     01/29/21  1427 10/12/20  1255 04/25/20  1637   NA 142 142 140   K 4.5 4.2 4.5   CL 106 108 104   CO2 24 23 24   GAP 12 11 12   UN 15 13 10   CREAT 0.96* 0.91 0.97*   GFRC  --   --  70   GFRB  --   --  81   GLU 80 74 79   CA 9.7 9.5 9.5      LIPID PROFILE   Recent Labs     04/25/20  1637   CHOL 122   TRIG 92   HDL 38*   LDLC 66      LIVER PROFILE   Recent Labs     01/29/21  1427 10/12/20  1255 04/25/20  1637   ALT 49* 19 25   AST 24 17 18   ALK 101 87 105   TB 0.4 0.3 0.4      DIABETES THYROID   Recent Labs     10/12/20  1255 04/25/20  1637   HA1C 6.0* 6.3*    Recent Labs     10/12/20  1255 04/25/20  1637   TSH 1.48 1.03         Pending/Orders Labs:  Lab Frequency Next Occurrence   COVID-19 PCR Once 10/24/2020

## 2021-04-03 ENCOUNTER — Telehealth: Payer: Self-pay | Admitting: Endocrinology

## 2021-04-03 ENCOUNTER — Other Ambulatory Visit: Payer: Self-pay

## 2021-04-03 DIAGNOSIS — Z760 Encounter for issue of repeat prescription: Secondary | ICD-10-CM

## 2021-04-03 NOTE — Telephone Encounter (Addendum)
LVM for patient to call office to check status of medication pharmacy stated she received.      Pt called back and stated she was able to pick up pods from [facility] and she is all set for one year.

## 2021-04-04 ENCOUNTER — Telehealth: Payer: Self-pay

## 2021-04-04 NOTE — Telephone Encounter (Signed)
Dian Queen, the patient's home care nurse, called and states that the patient moved out of state and needs to be discharged from [place] services. Glenard Haring needs a verbal okay to be able to dismiss them. Glenard Haring [phone]

## 2021-04-04 NOTE — Telephone Encounter (Signed)
Confidential Drug Report  Search Terms: Dolly Harbach, 24-May-1975   Search Date: 04/04/2021 07:54:32 AM   Searching on behalf of: [id number] - [name]   The Drug Utilization Report below displays all of the controlled substance prescriptions, if any, that your patient has filled in the last twelve months. The information displayed on this report is compiled from pharmacy submissions to the Department, and accurately reflects the information as submitted by the pharmacies.  This report was requested by: Kaylee Harris   Reference #: [id number]   Others' Prescriptions  Patient Name: Kaylee Harris   Birth Date: Mar 29, 1976   Address: [place], [place] [address]   Sex: Female   Rx Written Rx Dispensed Drug Quantity Days Supply Prescriber Name Prescriber [place] # Payment Method Dispenser   03/19/2021 03/23/2021 tramadol hcl 50 mg tablet  42 14 Phillips, [place]. #02   03/02/2021 03/21/2021 alprazolam 1 mg tablet  15 35 Darnelle Spangle [id number] [place]. #19   03/02/2021 03/21/2021 diphenoxylate-atropine 2.5-0.025 mg tablet  240 30 Darnelle Spangle [id number] Insurance [place]. #19   03/01/2021 03/03/2021 tramadol hcl 50 mg tablet  42 14 Darnelle Spangle [id number] [place]. #02   02/08/2021 02/09/2021 tramadol hcl 50 mg tablet  42 14 Darnelle Spangle [id number] [place]. #02   01/18/2021 01/19/2021 tramadol hcl 50 mg tablet  42 14 Darnelle Spangle [id number] [place]. #02   12/26/2020 12/27/2020 tramadol hcl 50 mg tablet  42 14 Darnelle Spangle [id number] [place]. #02   12/13/2020 12/19/2020 alprazolam 1 mg tablet  15 35 Reginold Agent [id number] [place]. #19   11/27/2020 12/01/2020 tramadol hcl 50 mg tablet  42 14 Reginold Agent [id number] [place]. #02   10/31/2020 11/01/2020  tramadol hcl 50 mg tablet  42 14 Reginold Agent [id number] [place]. #02   10/03/2020 10/09/2020 tramadol hcl 50 mg tablet  42 14 Darnelle Spangle [id number] [place]. #02   09/28/2020 10/04/2020 diphenoxylate-atropine 2.5-0.025 mg tablet  993 30 Reginold Agent [id number] [place]. #19   08/31/2020 09/05/2020 alprazolam 1 mg tablet  15 35 Reginold Agent [id number] [place]. #19   08/31/2020 09/04/2020 tramadol hcl 50 mg tablet  42 14 Reginold Agent [id number] [place]. #02   08/01/2020 08/01/2020 tramadol hcl 50 mg tablet  42 14 Reginold Agent [id number] [place]. #02   07/31/2020 07/31/2020 acetaminophen-codeine 120 mg-12 mg/5 ml solution  160m 6 MJohny Drilling GDarnell Level(MD) FDeaver #02   07/10/2020 07/10/2020 tramadol hcl 50 mg tablet  42 14 CReginold [id number] #02   06/12/2020 06/15/2020 alprazolam 1 mg tablet  15 35 CReginold [id number] #19   06/15/2020 06/15/2020 tramadol hcl 50 mg tablet  42 14 MJohny Drilling GDarnell Level(MD) FPicayune #02   05/07/2020 05/09/2020 tramadol hcl 50 mg tablet  [id number] MJohny Drilling GDarnell Level(MD) FKeener #02     * - Drugs marked with an asterisk are compound drugs. If the compound drug is made up of more than one controlled substance, then each controlled substance will be a separate row in the table.  Last office visit:   01/22/2021  Patients upcoming appointments:  Future Appointments   Date Time Provider [place]   04/13/2021  2:00 PM [name], MD CAF None   10/15/2021  2:00 PM [name], MD CAF None       Recent Lab Values 10/12/2020 04/14/2019 01/04/2018 12/17/2017 10/17/2017 10/17/2017 03/31/2017   EXP DATE - 2019-08-04 9/20  02/02/2018 11-03-18 08-04-18 04/04/18   UREMK See Text - - - - - -   THCU POS - - - - - -       Recent Lab results:  GENERAL CHEMISTRY   Recent Labs     01/29/21  1427 10/12/20  1255 04/25/20  1637   NA 142 142 140   K 4.5 4.2 4.5   CL 106 108 104   CO2 '24 23 24   ' GAP '12 11 12   ' UN '15 13 10   ' CREAT 0.96* 0.91 0.97*   GFRC  --   --  70   GFRB  --   --  81   GLU 80 74 79   CA 9.7 9.5 9.5      LIPID PROFILE   Recent Labs     04/25/20  1637   CHOL 122   TRIG 92   HDL 38*   LDLC 66      LIVER PROFILE   Recent Labs     01/29/21  1427 10/12/20  1255 04/25/20  1637   ALT 49* 19 25   AST '24 17 18   ' ALK 101 87 105   TB 0.4 0.3 0.4      DIABETES THYROID   Recent Labs     10/12/20  1255 04/25/20  1637   HA1C 6.0* 6.3*    Recent Labs     10/12/20  1255 04/25/20  1637   TSH 1.48 1.03         Pending/Orders Labs:  Lab Frequency Next Occurrence   COVID-19 PCR Once 10/24/2020

## 2021-04-05 ENCOUNTER — Other Ambulatory Visit: Payer: Self-pay | Admitting: Primary Care

## 2021-04-05 DIAGNOSIS — F319 Bipolar disorder, unspecified: Secondary | ICD-10-CM

## 2021-04-05 DIAGNOSIS — F603 Borderline personality disorder: Secondary | ICD-10-CM

## 2021-04-05 MED ORDER — DULOXETINE HCL 60 MG PO CPEP *I*
60.0000 mg | DELAYED_RELEASE_CAPSULE | Freq: Every day | ORAL | 3 refills | Status: DC
Start: 2021-04-05 — End: 2021-04-27

## 2021-04-05 NOTE — Telephone Encounter (Signed)
Last office visit:   01/22/2021  Patients upcoming appointments:  Future Appointments   Date Time Provider Department [place]   04/13/2021  2:00 PM Moore, Jillian, MD CAF None   10/15/2021  2:00 PM Moore, Jillian, MD CAF None       Recent Lab Values 10/12/2020 04/14/2019 01/04/2018 12/17/2017 10/17/2017 10/17/2017 03/31/2017   EXP DATE - 2019-08-04 9/20 02/02/2018 11-03-18 08-04-18 04/04/18   UREMK See Text - - - - - -   THCU POS - - - - - -       Recent Lab results:  GENERAL CHEMISTRY   Recent Labs     01/29/21  1427 10/12/20  1255 04/25/20  1637   NA 142 142 140   K 4.5 4.2 4.5   CL 106 108 104   CO2 24 23 24   GAP 12 11 12   UN 15 13 10   CREAT 0.96* 0.91 0.97*   GFRC  --   --  70   GFRB  --   --  81   GLU 80 74 79   CA 9.7 9.5 9.5      LIPID PROFILE   Recent Labs     04/25/20  1637   CHOL 122   TRIG 92   HDL 38*   LDLC 66      LIVER PROFILE   Recent Labs     01/29/21  1427 10/12/20  1255 04/25/20  1637   ALT 49* 19 25   AST 24 17 18   ALK 101 87 105   TB 0.4 0.3 0.4      DIABETES THYROID   Recent Labs     10/12/20  1255 04/25/20  1637   HA1C 6.0* 6.3*    Recent Labs     10/12/20  1255 04/25/20  1637   TSH 1.48 1.03         Pending/Orders Labs:  Lab Frequency Next Occurrence   COVID-19 PCR Once 10/24/2020

## 2021-04-05 NOTE — Telephone Encounter (Signed)
Kaylee Drilling, MD  [place] Nurse 12 hours ago (7:58 PM)     Kaylee Harris  Ok to discharge , please provide verbal order

## 2021-04-05 NOTE — Telephone Encounter (Signed)
Called and spoke to [place], gave verbal okay to discharge pt, Glenard Haring verbalized understanding.

## 2021-04-09 ENCOUNTER — Telehealth: Payer: Self-pay | Admitting: Primary Care

## 2021-04-09 MED ORDER — DULOXETINE HCL 30 MG PO CPEP *I*
30.0000 mg | DELAYED_RELEASE_CAPSULE | Freq: Every day | ORAL | 3 refills | Status: DC
Start: 2021-04-09 — End: 2021-04-27

## 2021-04-09 NOTE — Telephone Encounter (Signed)
Pharmacist from [facility] [address] called requesting a refill for Duloxetine 30 MG capsule. I explained to the pharmacist this is not on her refill list. She stated the patient needs to take 90 MG of this medication. She is requesting 30 mg capsules. Please advise.

## 2021-04-09 NOTE — Telephone Encounter (Signed)
rx sent

## 2021-04-13 ENCOUNTER — Encounter: Payer: Medicare (Managed Care) | Admitting: Primary Care

## 2021-04-20 ENCOUNTER — Encounter: Payer: Self-pay | Admitting: Gastroenterology

## 2021-04-23 ENCOUNTER — Encounter: Payer: Self-pay | Admitting: Gastroenterology

## 2021-04-23 NOTE — Progress Notes (Signed)
[facility] has faxed over home care orders to be signed and dated. The forms have been placed in the provider's bin for completion. Once the forms have been completed, please fax to [place] at [phone]

## 2021-04-24 NOTE — Progress Notes (Signed)
The form has been faxed and confirmed to [phone]. The original has been placed in scanning.

## 2021-04-27 ENCOUNTER — Other Ambulatory Visit: Payer: Self-pay | Admitting: Primary Care

## 2021-04-27 ENCOUNTER — Other Ambulatory Visit: Payer: Self-pay | Admitting: Dermatology

## 2021-04-27 ENCOUNTER — Other Ambulatory Visit: Payer: Self-pay | Admitting: Family Medicine

## 2021-04-27 ENCOUNTER — Other Ambulatory Visit: Payer: Self-pay | Admitting: Psychiatry

## 2021-04-27 ENCOUNTER — Other Ambulatory Visit: Payer: Self-pay

## 2021-04-27 ENCOUNTER — Other Ambulatory Visit: Payer: Self-pay | Admitting: Student in an Organized Health Care Education/Training Program

## 2021-04-27 ENCOUNTER — Encounter: Payer: Self-pay | Admitting: Primary Care

## 2021-04-27 DIAGNOSIS — F419 Anxiety disorder, unspecified: Secondary | ICD-10-CM

## 2021-04-27 DIAGNOSIS — F603 Borderline personality disorder: Secondary | ICD-10-CM

## 2021-04-27 DIAGNOSIS — J45909 Unspecified asthma, uncomplicated: Secondary | ICD-10-CM

## 2021-04-27 DIAGNOSIS — F319 Bipolar disorder, unspecified: Secondary | ICD-10-CM

## 2021-04-27 DIAGNOSIS — K219 Gastro-esophageal reflux disease without esophagitis: Secondary | ICD-10-CM

## 2021-04-27 DIAGNOSIS — Z760 Encounter for issue of repeat prescription: Secondary | ICD-10-CM

## 2021-04-27 DIAGNOSIS — Z9109 Other allergy status, other than to drugs and biological substances: Secondary | ICD-10-CM

## 2021-04-27 MED ORDER — PROMETHAZINE HCL 25 MG PO TABS *I*
25.0000 mg | ORAL_TABLET | ORAL | 1 refills | Status: AC | PRN
Start: 2021-04-27 — End: ?

## 2021-04-27 MED ORDER — ASCORBIC ACID 500 MG PO TABS *I*
500.0000 mg | ORAL_TABLET | Freq: Every day | ORAL | 1 refills | Status: AC
Start: 2021-04-27 — End: ?

## 2021-04-27 MED ORDER — FLUTICASONE PROPIONATE 50 MCG/ACT NA SUSP *I*
1.0000 | Freq: Every day | NASAL | 1 refills | Status: AC
Start: 2021-04-27 — End: ?

## 2021-04-27 MED ORDER — ALBUTEROL SULFATE HFA 108 (90 BASE) MCG/ACT IN AERS *I*
INHALATION_SPRAY | RESPIRATORY_TRACT | 1 refills | Status: AC
Start: 2021-04-27 — End: ?

## 2021-04-27 MED ORDER — METOPROLOL SUCCINATE 25 MG PO TB24 *I*
25.0000 mg | ORAL_TABLET | Freq: Every day | ORAL | 0 refills | Status: DC
Start: 2021-04-27 — End: 2021-05-28

## 2021-04-27 MED ORDER — SUMATRIPTAN SUCCINATE 50 MG PO TABS *I*
ORAL_TABLET | ORAL | 1 refills | Status: AC
Start: 2021-04-27 — End: ?

## 2021-04-27 MED ORDER — FLUTICASONE PROPIONATE HFA 220 MCG/ACT IN AERO *I*
1.0000 | INHALATION_SPRAY | Freq: Every day | RESPIRATORY_TRACT | 2 refills | Status: DC
Start: 2021-04-27 — End: 2022-02-18

## 2021-04-27 MED ORDER — LAMOTRIGINE 200 MG PO TABS *I*
200.0000 mg | ORAL_TABLET | Freq: Every day | ORAL | 0 refills | Status: DC
Start: 2021-04-27 — End: 2021-06-07

## 2021-04-27 MED ORDER — ALPRAZOLAM 1 MG PO TABS *I*
1.0000 mg | ORAL_TABLET | ORAL | 0 refills | Status: DC
Start: 2021-04-27 — End: 2021-06-07

## 2021-04-27 MED ORDER — DULOXETINE HCL 60 MG PO CPEP *I*
60.0000 mg | DELAYED_RELEASE_CAPSULE | Freq: Every day | ORAL | 0 refills | Status: AC
Start: 2021-04-27 — End: ?

## 2021-04-27 MED ORDER — QUETIAPINE FUMARATE 50 MG PO TB24 *I*
50.0000 mg | ORAL_TABLET | Freq: Every evening | ORAL | 0 refills | Status: DC
Start: 2021-04-27 — End: 2021-05-28

## 2021-04-27 MED ORDER — FAMOTIDINE 20 MG PO TABS *I*
ORAL_TABLET | ORAL | 0 refills | Status: AC
Start: 2021-04-27 — End: ?

## 2021-04-27 MED ORDER — DULOXETINE HCL 30 MG PO CPEP *I*
30.0000 mg | DELAYED_RELEASE_CAPSULE | Freq: Every day | ORAL | 0 refills | Status: DC
Start: 2021-04-27 — End: 2021-07-24

## 2021-04-27 MED ORDER — PANTOPRAZOLE SODIUM 40 MG PO TBEC *I*
40.0000 mg | DELAYED_RELEASE_TABLET | Freq: Every day | ORAL | 0 refills | Status: DC
Start: 2021-04-27 — End: 2021-06-07

## 2021-04-27 MED ORDER — TOPIRAMATE 100 MG PO TABS *I*
100.0000 mg | ORAL_TABLET | Freq: Two times a day (BID) | ORAL | 0 refills | Status: DC
Start: 2021-04-27 — End: 2021-05-28

## 2021-04-27 MED ORDER — BUSPIRONE HCL 30 MG PO TABS *I*
30.0000 mg | ORAL_TABLET | Freq: Two times a day (BID) | ORAL | 0 refills | Status: AC
Start: 2021-04-27 — End: ?

## 2021-04-27 MED ORDER — DICYCLOMINE HCL 10 MG PO CAPS *I*
10.0000 mg | ORAL_CAPSULE | Freq: Four times a day (QID) | ORAL | 0 refills | Status: AC
Start: 2021-04-27 — End: ?

## 2021-04-27 MED ORDER — TRAMADOL HCL 50 MG PO TABS *I*
50.0000 mg | ORAL_TABLET | Freq: Three times a day (TID) | ORAL | 0 refills | Status: DC | PRN
Start: 2021-04-27 — End: 2021-05-28

## 2021-04-27 MED ORDER — MIDODRINE HCL 10 MG PO TABS *I*
10.0000 mg | ORAL_TABLET | Freq: Three times a day (TID) | ORAL | 0 refills | Status: DC
Start: 2021-04-27 — End: 2021-05-28

## 2021-04-27 MED ORDER — BACLOFEN 10 MG PO TABS *I*
10.0000 mg | ORAL_TABLET | Freq: Three times a day (TID) | ORAL | 1 refills | Status: AC | PRN
Start: 2021-04-27 — End: ?

## 2021-04-27 MED ORDER — CETIRIZINE HCL 10 MG PO TABS *I*
10.0000 mg | ORAL_TABLET | Freq: Two times a day (BID) | ORAL | 0 refills | Status: AC
Start: 2021-04-27 — End: ?

## 2021-04-27 MED ORDER — ATORVASTATIN CALCIUM 40 MG PO TABS *I*
40.0000 mg | ORAL_TABLET | Freq: Every day | ORAL | 0 refills | Status: DC
Start: 2021-04-27 — End: 2021-06-07

## 2021-04-27 NOTE — Telephone Encounter (Signed)
Confidential Drug Report  Search Terms: Kaylee Harris, 1975/11/19   Search Date: 04/27/2021 07:50:46 AM   Searching on behalf of: [id number] - [name]   The Drug Utilization Report below displays all of the controlled substance prescriptions, if any, that your patient has filled in the last twelve months. The information displayed on this report is compiled from pharmacy submissions to the Department, and accurately reflects the information as submitted by the pharmacies.  This report was requested by: Jearld Adjutant   Reference #: [id number]   Others' Prescriptions  Patient Name: Kaylee Harris   Birth Date: [birth year]   Address: [place] [address]   Sex: Female   Rx Written Rx Dispensed Drug Quantity Days Supply Prescriber Name Prescriber [place] # Payment Method Dispenser   04/04/2021 04/05/2021 tramadol hcl 50 mg tablet  42 [place], [place]. #02      Patient Name: Kaylee Harris   Birth Date: 1975/11/29   Address: [address] [place], [place] [address]   Sex: Female     Rx Written Rx Dispensed Drug Quantity Days Supply Prescriber Name Prescriber [place] # Payment Method Dispenser   03/19/2021 03/23/2021 tramadol hcl 50 mg tablet  42 14 Phillips, [place]. #02   03/02/2021 03/21/2021 alprazolam 1 mg tablet  15 35 Darnelle Spangle [id number] [place]. #19   03/02/2021 03/21/2021 diphenoxylate-atropine 2.5-0.025 mg tablet  240 30 Darnelle Spangle [id number] Insurance [place]. #19   03/01/2021 03/03/2021 tramadol hcl 50 mg tablet  42 14 Darnelle Spangle [id number] [place]. #02   02/08/2021 02/09/2021 tramadol hcl 50 mg tablet  42 14 Darnelle Spangle [id number] [place]. #02   01/18/2021 01/19/2021 tramadol hcl 50 mg tablet  42 14 Darnelle Spangle [id number] [place]. #02   12/26/2020 12/27/2020 tramadol hcl 50 mg tablet  42  14 Darnelle Spangle [id number] [place]. #02   12/13/2020 12/19/2020 alprazolam 1 mg tablet  15 35 Reginold Agent [id number] [place]. #19   11/27/2020 12/01/2020 tramadol hcl 50 mg tablet  42 14 Reginold Agent [id number] [place]. #02   10/31/2020 11/01/2020 tramadol hcl 50 mg tablet  42 14 Reginold Agent [id number] [place]. #02   10/03/2020 10/09/2020 tramadol hcl 50 mg tablet  42 14 Darnelle Spangle [id number] [place]. #02   09/28/2020 10/04/2020 diphenoxylate-atropine 2.5-0.025 mg tablet  809 30 Reginold Agent [id number] [place]. #19   08/31/2020 09/05/2020 alprazolam 1 mg tablet  15 35 Reginold Agent [id number] [place]. #19   08/31/2020 09/04/2020 tramadol hcl 50 mg tablet  42 14 Reginold Agent [id number] [place]. #02   08/01/2020 08/01/2020 tramadol hcl 50 mg tablet  42 14 Reginold Agent [id number] [place]. #02   07/31/2020 07/31/2020 acetaminophen-codeine 120 mg-12 mg/5 ml solution  146m 6 MJohny Drilling GDarnell Level(MD) FAllen #02   07/10/2020 07/10/2020 tramadol hcl 50 mg tablet  42 14 CReginold [id number] [place] #02   06/12/2020 06/15/2020 alprazolam 1 mg tablet  15 35 CReginold [id number] [place] #19   06/15/2020 06/15/2020 tramadol hcl 50 mg tablet  42 14 MJohny Drilling GDarnell Level(MD) FMcGrew #  02   05/07/2020 05/09/2020 tramadol hcl 50 mg tablet  42 14 [name], Darnell Level (MD) [place]. #02     * - Drugs marked with an asterisk are compound drugs. If the compound drug is made up of more than one controlled substance, then each controlled substance will be a separate row in the table.       Last office  visit:   01/22/2021  Patients upcoming appointments:  No future appointments.    Recent Lab Values 10/12/2020 04/14/2019 01/04/2018 12/17/2017 10/17/2017 10/17/2017 03/31/2017   EXP DATE - 2019-08-04 9/20 02/02/2018 11-03-18 08-04-18 04/04/18   UREMK See Text - - - - - -   THCU POS - - - - - -       Recent Lab results:  GENERAL CHEMISTRY   Recent Labs     01/29/21  1427 10/12/20  1255   NA 142 142   K 4.5 4.2   CL 106 108   CO2 24 23   GAP 12 11   UN 15 13   CREAT 0.96* 0.91   GLU 80 74   CA 9.7 9.5      LIPID PROFILE   No value within the past 365 days   LIVER PROFILE   Recent Labs     01/29/21  1427 10/12/20  1255   ALT 49* 19   AST 24 17   ALK 101 87   TB 0.4 0.3      DIABETES THYROID   Recent Labs     10/12/20  1255   HA1C 6.0*    Recent Labs     10/12/20  1255   TSH 1.48         Pending/Orders Labs:  Lab Frequency Next Occurrence   COVID-19 PCR Once 10/24/2020

## 2021-04-27 NOTE — Telephone Encounter (Signed)
Confidential Drug Report  Search Terms: Jolette Lana, 10/29/1975   Search Date: 04/27/2021 08:11:43 AM   Searching on behalf of: [id number] - [name]   The Drug Utilization Report below displays all of the controlled substance prescriptions, if any, that your patient has filled in the last twelve months. The information displayed on this report is compiled from pharmacy submissions to the Department, and accurately reflects the information as submitted by the pharmacies.  This report was requested by: Denton Meek   Reference #: [id number]   Others' Prescriptions  Patient Name: Kaylee Harris   Birth Date: 01-16-1976   Address: [place], [place] [address]   Sex: Female   Rx Written Rx Dispensed Drug Quantity Days Supply Prescriber Name Prescriber [place] # Payment Method Dispenser   03/19/2021 03/23/2021 tramadol hcl 50 mg tablet  42 14 Darnelle Spangle [id number] [employer] #02   03/02/2021 03/21/2021 alprazolam 1 mg tablet  15 35 Darnelle Spangle [id number] [place]. #19   03/02/2021 03/21/2021 diphenoxylate-atropine 2.5-0.025 mg tablet  240 30 Darnelle Spangle [id number] Insurance [place]. #19   03/01/2021 03/03/2021 tramadol hcl 50 mg tablet  42 14 Darnelle Spangle [id number] [place]. #02   02/08/2021 02/09/2021 tramadol hcl 50 mg tablet  42 14 Darnelle Spangle [id number] [place]. #02   01/18/2021 01/19/2021 tramadol hcl 50 mg tablet  42 14 Darnelle Spangle [id number] [place]. #02   12/26/2020 12/27/2020 tramadol hcl 50 mg tablet  42 14 Darnelle Spangle [id number] [place]. #02   12/13/2020 12/19/2020 alprazolam 1 mg tablet  15 35 Reginold Agent [id number] [place]. #19   11/27/2020 12/01/2020 tramadol hcl 50 mg tablet  42 14 Reginold Agent [id number] [place]. #02   10/31/2020 11/01/2020  tramadol hcl 50 mg tablet  42 14 Reginold Agent [id number] [place]. #02   10/03/2020 10/09/2020 tramadol hcl 50 mg tablet  42 14 Darnelle Spangle [id number] [place]. #02   09/28/2020 10/04/2020 diphenoxylate-atropine 2.5-0.025 mg tablet  637 30 Reginold Agent [id number] [place]. #19   08/31/2020 09/05/2020 alprazolam 1 mg tablet  15 35 Reginold Agent [id number] Insurance [place]. #19   08/31/2020 09/04/2020 tramadol hcl 50 mg tablet  42 14 Reginold Agent [id number] [place]. #02   08/01/2020 08/01/2020 tramadol hcl 50 mg tablet  42 14 Reginold Agent [id number] [place]. #02   07/31/2020 07/31/2020 acetaminophen-codeine 120 mg-12 mg/5 ml solution  110m 6 MJohny Drilling GDarnell Level(MD) FAccomac #02   07/10/2020 07/10/2020 tramadol hcl 50 mg tablet  42 14 CReginold [id number] Hill #02   06/12/2020 06/15/2020 alprazolam 1 mg tablet  15 35 CReginold [id number] #19   06/15/2020 06/15/2020 tramadol hcl 50 mg tablet  42 14 MJohny Drilling GDarnell Level(MD) FMadison #02   05/07/2020 05/09/2020 tramadol hcl 50 mg tablet  45814 MJohny Drilling GDarnell Level(MD) FHartly #02      Patient Name: Kaylee Harris  Birth Date: 1November 08, 1977  [address]: [address] [place] [address]  Sex: Female     Rx Written Rx Dispensed Drug Quantity Days Supply Prescriber Name Prescriber [place] #  Payment Method Dispenser   04/04/2021 04/05/2021 tramadol hcl 50 mg tablet  42 14 Darnelle Spangle [id number] [place]. #02     * - Drugs marked with an asterisk are compound drugs. If the compound drug is made up of more than one controlled substance, then each controlled substance will be a separate row in the table.        Last office visit:   01/22/2021  Patients upcoming appointments:  No future appointments.    Recent Lab Values 10/12/2020 04/14/2019 01/04/2018 12/17/2017 10/17/2017 10/17/2017 03/31/2017   EXP DATE - 2019-08-04 9/20 02/02/2018 11-03-18 08-04-18 04/04/18   UREMK See Text - - - - - -   THCU POS - - - - - -       Recent Lab results:  GENERAL CHEMISTRY   Recent Labs     01/29/21  1427 10/12/20  1255   NA 142 142   K 4.5 4.2   CL 106 108   CO2 24 23   GAP 12 11   UN 15 13   CREAT 0.96* 0.91   GLU 80 74   CA 9.7 9.5      LIPID PROFILE   No value within the past 365 days   LIVER PROFILE   Recent Labs     01/29/21  1427 10/12/20  1255   ALT 49* 19   AST 24 17   ALK 101 87   TB 0.4 0.3      DIABETES THYROID   Recent Labs     10/12/20  1255   HA1C 6.0*    Recent Labs     10/12/20  1255   TSH 1.48         Pending/Orders Labs:  Lab Frequency Next Occurrence   COVID-19 PCR Once 10/24/2020

## 2021-04-27 NOTE — Telephone Encounter (Signed)
This medication has not been prescribed since 01/12/2016.  Please advise.           Last office visit:   01/22/2021  Patients upcoming appointments:  No future appointments.    Recent Lab Values 10/12/2020 04/14/2019 01/04/2018 12/17/2017 10/17/2017 10/17/2017 03/31/2017   EXP DATE - 2019-08-04 9/20 02/02/2018 11-03-18 08-04-18 04/04/18   UREMK See Text - - - - - -   THCU POS - - - - - -       Recent Lab results:  GENERAL CHEMISTRY   Recent Labs     01/29/21  1427 10/12/20  1255   NA 142 142   K 4.5 4.2   CL 106 108   CO2 24 23   GAP 12 11   UN 15 13   CREAT 0.96* 0.91   GLU 80 74   CA 9.7 9.5      LIPID PROFILE   No value within the past 365 days   LIVER PROFILE   Recent Labs     01/29/21  1427 10/12/20  1255   ALT 49* 19   AST 24 17   ALK 101 87   TB 0.4 0.3      DIABETES THYROID   Recent Labs     10/12/20  1255   HA1C 6.0*    Recent Labs     10/12/20  1255   TSH 1.48         Pending/Orders Labs:  Lab Frequency Next Occurrence   COVID-19 PCR Once 10/24/2020

## 2021-04-27 NOTE — Telephone Encounter (Signed)
Last office visit:   01/22/2021  Patients upcoming appointments:  No future appointments.    Recent Lab Values 10/12/2020 04/14/2019 01/04/2018 12/17/2017 10/17/2017 10/17/2017 03/31/2017   EXP DATE - 2019-08-04 9/20 02/02/2018 11-03-18 08-04-18 04/04/18   UREMK See Text - - - - - -   THCU POS - - - - - -       Recent Lab results:  GENERAL CHEMISTRY   Recent Labs     01/29/21  1427 10/12/20  1255   NA 142 142   K 4.5 4.2   CL 106 108   CO2 24 23   GAP 12 11   UN 15 13   CREAT 0.96* 0.91   GLU 80 74   CA 9.7 9.5      LIPID PROFILE   No value within the past 365 days   LIVER PROFILE   Recent Labs     01/29/21  1427 10/12/20  1255   ALT 49* 19   AST 24 17   ALK 101 87   TB 0.4 0.3      DIABETES THYROID   Recent Labs     10/12/20  1255   HA1C 6.0*    Recent Labs     10/12/20  1255   TSH 1.48         Pending/Orders Labs:  Lab Frequency Next Occurrence   COVID-19 PCR Once 10/24/2020         Confidential Drug Report  Search Terms: Adream Parzych, Apr 09, 1976   Search Date: 04/27/2021 07:59:47 AM   Searching on behalf of: [id number] - [name]   The Drug Utilization Report below displays all of the controlled substance prescriptions, if any, that your patient has filled in the last twelve months. The information displayed on this report is compiled from pharmacy submissions to the Department, and accurately reflects the information as submitted by the pharmacies.  This report was requested by: Jearld Adjutant   Reference #: [id number]   Others' Prescriptions  Patient Name: Kaylee Harris   Birth Date: April 13, 1976   Address: [place] [address]   Sex: Female   Rx Written Rx Dispensed Drug Quantity Days Supply Prescriber Name Prescriber [place] # Payment Method Dispenser   04/04/2021 04/05/2021 tramadol hcl 50 mg tablet  42 [place], [place]. #02      Patient Name: Jamise Pentland   Birth Date: 1975-12-25   Address: [address] [place], [place] [address]   Sex: Female     Rx Written Rx  Dispensed Drug Quantity Days Supply Prescriber Name Prescriber [place] # Payment Method Dispenser   03/19/2021 03/23/2021 tramadol hcl 50 mg tablet  42 14 Darnelle Spangle [id number] [place]. #02   03/02/2021 03/21/2021 alprazolam 1 mg tablet  15 35 Darnelle Spangle [id number] [place] #19   03/02/2021 03/21/2021 diphenoxylate-atropine 2.5-0.025 mg tablet  240 30 Darnelle Spangle [id number] [employer] [place] #19   03/01/2021 03/03/2021 tramadol hcl 50 mg tablet  42 14 Darnelle Spangle [id number] Insurance [place] #02   02/08/2021 02/09/2021 tramadol hcl 50 mg tablet  42 14 Darnelle Spangle [id number] Insurance [place] #02   01/18/2021 01/19/2021 tramadol hcl 50 mg tablet  42 14 Darnelle Spangle [id number] [place] #02   12/26/2020 12/27/2020 tramadol hcl 50 mg tablet  42 14 Darnelle Spangle [id number] [place]. [phone]  12/13/2020 12/19/2020 alprazolam 1 mg tablet  15 35 Reginold Agent [id number] [employer] #19   11/27/2020 12/01/2020 tramadol hcl 50 mg tablet  [address] Reginold Agent [id number] [employer] #02   10/31/2020 11/01/2020 tramadol hcl 50 mg tablet  [address] Reginold Agent [id number] [place] #02   10/03/2020 10/09/2020 tramadol hcl 50 mg tablet  42 14 Darnelle Spangle [id number] [employer] #02   09/28/2020 10/04/2020 diphenoxylate-atropine 2.5-0.025 mg tablet  470 30 Reginold Agent [id number] [employer] #19   08/31/2020 09/05/2020 alprazolam 1 mg tablet  15 35 [place], [place] [id number] Insurance [employer] #19   08/31/2020 09/04/2020 tramadol hcl 50 mg tablet  [address] Reginold Agent [id number] [place]. #02   08/01/2020 08/01/2020 tramadol hcl 50 mg tablet  [address] Reginold Agent [id number] [place]. #02   07/31/2020 07/31/2020 acetaminophen-codeine 120 mg-12 mg/5 ml solution  181m 6 MJohny Drilling GDarnell Level(MD) FG[place] #02   07/10/2020 07/10/2020 tramadol hcl 50 mg tablet  42 14 CReginold [id number] #02   06/12/2020 06/15/2020 alprazolam 1 mg tablet  15 35 CReginold [id number] #19   06/15/2020 06/15/2020 tramadol hcl 50 mg tablet  42 14 MJohny Drilling GDarnell Level(MD) FHoos[place] #02   05/07/2020 05/09/2020 tramadol hcl 50 mg tablet  [address]14 MJohny Drilling GDarnell Level(MD) F[place] #02     * - Drugs marked with an asterisk are compound drugs. If the compound drug is made up of more than one controlled substance, then each controlled substance will be a separate row in the table.

## 2021-05-03 MED ORDER — TRULICITY 0.75 MG/0.5ML SC SOAJ
SUBCUTANEOUS | 5 refills | Status: AC
Start: 2021-05-03 — End: ?

## 2021-05-03 MED ORDER — INSULIN LISPRO (HUMAN) 100 UNIT/ML IJ/SC SOLN *WRAPPED*
SUBCUTANEOUS | 3 refills | Status: AC
Start: 2021-05-03 — End: ?

## 2021-05-04 ENCOUNTER — Telehealth: Payer: Self-pay

## 2021-05-04 NOTE — Telephone Encounter (Signed)
Please let pt know unfortunately I cannot treat her virtually b/c she is out of state now. She should go to [facility] in [place] where she lives now

## 2021-05-04 NOTE — Telephone Encounter (Signed)
Per Dr. Laurance Flatten let pt know unfortunately I cannot treat her virtually b/c she is out of state now. She should go to UC in [place].    Stated understanding and was ok

## 2021-05-04 NOTE — Telephone Encounter (Signed)
Patient tested positive for covid. No appointment available. Patient she has moved.  Patient would like antiviral

## 2021-05-14 ENCOUNTER — Encounter: Payer: Self-pay | Admitting: Gastroenterology

## 2021-05-22 ENCOUNTER — Telehealth: Payer: Self-pay

## 2021-05-22 NOTE — Telephone Encounter (Signed)
Patient called and states she moved out of state with her spouce. Patient requested that I up load a release for records so she can have her record sent to the new doctor. Marland Kitchen

## 2021-05-26 ENCOUNTER — Other Ambulatory Visit: Payer: Self-pay | Admitting: Primary Care

## 2021-05-26 DIAGNOSIS — Z760 Encounter for issue of repeat prescription: Secondary | ICD-10-CM

## 2021-05-28 ENCOUNTER — Telehealth: Payer: Self-pay

## 2021-05-28 DIAGNOSIS — Z760 Encounter for issue of repeat prescription: Secondary | ICD-10-CM

## 2021-05-28 NOTE — Telephone Encounter (Signed)
Search Terms: Khelani Kops, May 11, 1975   Search Date: 05/28/2021 08:30:24 AM   Searching on behalf of: [id number] - [name]   The Drug Utilization Report below displays all of the controlled substance prescriptions, if any, that your patient has filled in the last twelve months. The information displayed on this report is compiled from pharmacy submissions to the Department, and accurately reflects the information as submitted by the pharmacies.  This report was requested by: Phineas Real   Reference #: [id number]   Others' Prescriptions  Patient Name: Kaylee Harris   Birth Date: March 25, 1976   Address: [place] [address]   Sex: Female   Rx Written Rx Dispensed Drug Quantity Days Supply Prescriber Name Prescriber [place] # Payment Method Dispenser   04/27/2021 05/03/2021 alprazolam 1 mg tablet  15 35 [name], Darnell Level (MD) [place]. #19   04/27/2021 05/03/2021 tramadol hcl 50 mg tablet  60 14 [name], Darnell Level (MD) [place]. #19   04/04/2021 04/05/2021 tramadol hcl 50 mg tablet  42 14 Darnelle Spangle [id number] [place]. #02      Patient Name: Kaylee Harris   Birth Date: 01-03-1976   Address: [address] [place], [place] [address]   Sex: Female     Rx Written Rx Dispensed Drug Quantity Days Supply Prescriber Name Prescriber [place] # Payment Method Dispenser   03/19/2021 03/23/2021 tramadol hcl 50 mg tablet  42 14 Darnelle Spangle [id number] [place]. #02   03/02/2021 03/21/2021 alprazolam 1 mg tablet  15 35 Darnelle Spangle [id number] [place] #19   03/02/2021 03/21/2021 diphenoxylate-atropine 2.5-0.025 mg tablet  240 30 Darnelle Spangle [id number] Insurance [place]. #19   03/01/2021 03/03/2021 tramadol hcl 50 mg tablet  42 14 Darnelle Spangle [id number] [place]. #02   02/08/2021 02/09/2021 tramadol hcl 50 mg tablet  42 14  Darnelle Spangle [id number] [place]. #02   01/18/2021 01/19/2021 tramadol hcl 50 mg tablet  42 14 Darnelle Spangle [id number] [place]. #02   12/26/2020 12/27/2020 tramadol hcl 50 mg tablet  42 14 Darnelle Spangle [id number] [place]. #02   12/13/2020 12/19/2020 alprazolam 1 mg tablet  15 35 Reginold Agent [id number] [place]. #19   11/27/2020 12/01/2020 tramadol hcl 50 mg tablet  42 14 Reginold Agent [id number] [place]. #02   10/31/2020 11/01/2020 tramadol hcl 50 mg tablet  42 14 Reginold Agent [id number] [place]. #02   10/03/2020 10/09/2020 tramadol hcl 50 mg tablet  42 14 Darnelle Spangle [id number] [place]. #02   09/28/2020 10/04/2020 diphenoxylate-atropine 2.5-0.025 mg tablet  371 30 Reginold Agent [id number] [place]. #19   08/31/2020 09/05/2020 alprazolam 1 mg tablet  15 35 Reginold Agent [id number] Insurance [place]. #19   08/31/2020 09/04/2020 tramadol hcl 50 mg tablet  42 14 Reginold Agent [id number] [place]. #02   08/01/2020 08/01/2020 tramadol hcl 50 mg tablet  42 14 Reginold Agent [id number] [place]. #02   07/31/2020 07/31/2020 acetaminophen-codeine 120 mg-12 mg/5 ml solution  166m 6 MJohny Drilling GDarnell Level(MD) FJeddito #02   07/10/2020 07/10/2020 tramadol hcl 50 mg tablet  42 14 CReginold [id number] [id number]  06/12/2020 06/15/2020 alprazolam 1 mg tablet  15 35 Reginold Agent [id number] [place]. #19   06/15/2020 06/15/2020 tramadol hcl 50 mg tablet  42 14 [name], Darnell Level (MD) [place]. [place]          Last office visit:   01/22/2021  Patients upcoming appointments:  No future  appointments.    Recent Lab Values 10/12/2020 04/14/2019 01/04/2018 12/17/2017 10/17/2017 10/17/2017 03/31/2017   EXP DATE - 2019-08-04 9/20 02/02/2018 11-03-18 08-04-18 04/04/18   UREMK See Text - - - - - -   THCU POS - - - - - -       Recent Lab results:  GENERAL CHEMISTRY   Recent Labs     01/29/21  1427 10/12/20  1255   NA 142 142   K 4.5 4.2   CL 106 108   CO2 24 23   GAP 12 11   UN 15 13   CREAT 0.96* 0.91   GLU 80 74   CA 9.7 9.5      LIPID PROFILE   No value within the past 365 days   LIVER PROFILE   Recent Labs     01/29/21  1427 10/12/20  1255   ALT 49* 19   AST 24 17   ALK 101 87   TB 0.4 0.3      DIABETES THYROID   Recent Labs     10/12/20  1255   HA1C 6.0*    Recent Labs     10/12/20  1255   TSH 1.48         Pending/Orders Labs:  Lab Frequency Next Occurrence   COVID-19 PCR Once 10/24/2020

## 2021-05-28 NOTE — Telephone Encounter (Signed)
This was already sent today by Vicente Males to the [facility].

## 2021-05-28 NOTE — Telephone Encounter (Signed)
The patient sent a request to release records to [facility]. The release was sent to [facility] for processing.

## 2021-05-28 NOTE — Telephone Encounter (Signed)
I called patient to ask if she was going to be moving out of state, Dr. Laurance Flatten was asking. She is moving out of state and has a new provider. She was wondering if she would be able to get a refill on her medication Tramadol. She tried asking her new provider however they said no because it is a Narcotic and there are no more refills as well.     She has a appointment in February with her New Doctor and she is compelty out.

## 2021-06-07 ENCOUNTER — Other Ambulatory Visit: Payer: Self-pay

## 2021-06-07 DIAGNOSIS — F603 Borderline personality disorder: Secondary | ICD-10-CM

## 2021-06-07 DIAGNOSIS — F319 Bipolar disorder, unspecified: Secondary | ICD-10-CM

## 2021-06-07 NOTE — Telephone Encounter (Addendum)
PLEASE COMPLETE I-STOP      Last office visit:   01/22/2021  Patients upcoming appointments:  No future appointments.    Recent Lab Values 10/12/2020 04/14/2019 01/04/2018 12/17/2017 10/17/2017 10/17/2017 03/31/2017   EXP DATE - 2019-08-04 9/20 02/02/2018 11-03-18 08-04-18 04/04/18   UREMK See Text - - - - - -   THCU POS - - - - - -       Recent Lab results:  GENERAL CHEMISTRY   Recent Labs     01/29/21  1427 10/12/20  1255   NA 142 142   K 4.5 4.2   CL 106 108   CO2 24 23   GAP 12 11   UN 15 13   CREAT 0.96* 0.91   GLU 80 74   CA 9.7 9.5      LIPID PROFILE   No value within the past 365 days   LIVER PROFILE   Recent Labs     01/29/21  1427 10/12/20  1255   ALT 49* 19   AST 24 17   ALK 101 87   TB 0.4 0.3      DIABETES THYROID   Recent Labs     10/12/20  1255   HA1C 6.0*    Recent Labs     10/12/20  1255   TSH 1.48         Pending/Orders Labs:  Lab Frequency Next Occurrence   COVID-19 PCR Once 10/24/2020

## 2021-06-08 MED ORDER — PANTOPRAZOLE SODIUM 40 MG PO TBEC *I*
40.0000 mg | DELAYED_RELEASE_TABLET | Freq: Every day | ORAL | 0 refills | Status: AC
Start: 2021-06-08 — End: ?

## 2021-06-08 MED ORDER — LAMOTRIGINE 200 MG PO TABS *I*
200.0000 mg | ORAL_TABLET | Freq: Every day | ORAL | 0 refills | Status: AC
Start: 2021-06-08 — End: ?

## 2021-06-08 MED ORDER — ATORVASTATIN CALCIUM 40 MG PO TABS *I*
40.0000 mg | ORAL_TABLET | Freq: Every day | ORAL | 0 refills | Status: AC
Start: 2021-06-08 — End: ?

## 2021-06-08 MED ORDER — ALPRAZOLAM 1 MG PO TABS *I*
1.0000 mg | ORAL_TABLET | ORAL | 0 refills | Status: AC
Start: 2021-06-08 — End: ?

## 2021-06-08 NOTE — Telephone Encounter (Signed)
This report was requested by: [name]   Reference #: [id number]   Others' Prescriptions  Patient Name: Kaylee Harris   Birth Date: [birth year]   Address: [place], [place] [address]   Sex: Female   Rx Written Rx Dispensed Drug Quantity Days Supply Prescriber Name Prescriber [place] # Payment Method Dispenser   03/19/2021 03/23/2021 tramadol hcl 50 mg tablet  42 14 Phillips, [place]. #02   03/02/2021 03/21/2021 alprazolam 1 mg tablet  15 35 Darnelle Spangle [id number] [place]. #19   03/02/2021 03/21/2021 diphenoxylate-atropine 2.5-0.025 mg tablet  240 30 Darnelle Spangle [id number] Insurance [place]. #19   03/01/2021 03/03/2021 tramadol hcl 50 mg tablet  42 14 Darnelle Spangle [id number] [place]. #02   02/08/2021 02/09/2021 tramadol hcl 50 mg tablet  42 14 Darnelle Spangle [id number] [place]. #02   01/18/2021 01/19/2021 tramadol hcl 50 mg tablet  42 14 Darnelle Spangle [id number] [place]. #02   12/26/2020 12/27/2020 tramadol hcl 50 mg tablet  42 14 Darnelle Spangle [id number] [place]. #02   12/13/2020 12/19/2020 alprazolam 1 mg tablet  15 35 Reginold Agent [id number] Insurance [place]. #19   11/27/2020 12/01/2020 tramadol hcl 50 mg tablet  42 14 Reginold Agent [id number] [place]. #02   10/31/2020 11/01/2020 tramadol hcl 50 mg tablet  42 14 Reginold Agent [id number] [place]. #02   10/03/2020 10/09/2020 tramadol hcl 50 mg tablet  42 14 Darnelle Spangle [id number] [place]. #02   09/28/2020 10/04/2020 diphenoxylate-atropine 2.5-0.025 mg tablet  258 30 Reginold Agent [id number] [place]. #19   08/31/2020 09/05/2020 alprazolam 1 mg tablet  15 35 Reginold Agent [id number] Insurance [place].  #19   08/31/2020 09/04/2020 tramadol hcl 50 mg tablet  42 14 Reginold Agent [id number] [place]. #02   08/01/2020 08/01/2020 tramadol hcl 50 mg tablet  42 14 Reginold Agent [id number] [place]. #02   07/31/2020 07/31/2020 acetaminophen-codeine 120 mg-12 mg/5 ml solution  149ml 6 [name], Darnell Level (MD) [place]. #02   07/10/2020 07/10/2020 tramadol hcl 50 mg tablet  42 14 Reginold Agent [id number] [place]. #02   06/12/2020 06/15/2020 alprazolam 1 mg tablet  15 35 Reginold Agent [id number] [place]. #19   06/15/2020 06/15/2020 tramadol hcl 50 mg tablet  42 14 [name], Darnell Level (MD) [place]. #02      Patient Name: Kaylee Harris   Birth Date: [birth year]   Address: [place] [address]   Sex: Female     Rx Written Rx Dispensed Drug Quantity Days Supply Prescriber Name Prescriber [place]a # Payment Method Dispenser   05/28/2021 05/30/2021 tramadol hcl 50 mg tablet  42 14 Darnelle Spangle [id number] Insurance [place]. #19   04/27/2021 05/03/2021 alprazolam 1 mg tablet  15 35 [name], Darnell Level (MD) [place]. #19   04/27/2021 05/03/2021 tramadol hcl 50 mg tablet  43 14 [name], Darnell Level (MD) [place]. #19   04/04/2021 04/05/2021 tramadol hcl 50 mg tablet  42 14 Darnelle Spangle [id number] [place]. #02     * - Drugs marked with  an asterisk are compound drugs. If the compound drug is made up of more than one controlled substance, then each controlled substance will be a separate row in the table.   2023

## 2021-06-26 ENCOUNTER — Encounter: Payer: Self-pay | Admitting: Registered Nurse

## 2021-06-27 NOTE — Telephone Encounter (Signed)
I refaxed the release to [facility].

## 2021-06-28 ENCOUNTER — Telehealth: Payer: Self-pay

## 2021-06-28 NOTE — Telephone Encounter (Signed)
Spoke to the patient to schedule a SWV. Patient lives in [place] and is no longer a patient.  Kaylee Harris also asked that we take  Kaylee Harris's name as PCP off  her spouses records too.

## 2021-07-06 ENCOUNTER — Encounter: Payer: Self-pay | Admitting: Internal Medicine

## 2021-07-06 ENCOUNTER — Telehealth: Payer: Self-pay | Admitting: Internal Medicine

## 2021-07-06 DIAGNOSIS — E1165 Type 2 diabetes mellitus with hyperglycemia: Secondary | ICD-10-CM

## 2021-07-06 MED ORDER — DEXCOM G6 TRANSMITTER MISC *A*
3 refills | Status: AC
Start: 2021-07-06 — End: ?

## 2021-07-06 MED ORDER — DEXCOM G6 RECEIVER DEVI *A*
0 refills | Status: AC | PRN
Start: 2021-07-06 — End: ?

## 2021-07-06 MED ORDER — DEXCOM G6 SENSOR MISC *A*
3 refills | Status: AC
Start: 2021-07-06 — End: ?

## 2021-07-06 NOTE — Telephone Encounter (Signed)
Patient moved to [place]    She ran out of dexcom    She can not get into endocrinology locally until April.       Will try sending to pharmacy in [place].      Gae Dry, ANP-BC, CDE, RD  Department of Endocrinology  [address] [place], [place] [address]  Phone: [phone]  Fax: [phone]

## 2021-07-15 ENCOUNTER — Other Ambulatory Visit: Payer: Self-pay

## 2021-07-15 ENCOUNTER — Other Ambulatory Visit: Payer: Self-pay | Admitting: Primary Care

## 2021-07-15 DIAGNOSIS — Z760 Encounter for issue of repeat prescription: Secondary | ICD-10-CM

## 2021-07-16 NOTE — Telephone Encounter (Signed)
Pt no longer lives in [place], or under [facility]'s care (see previous Telephone encounter).  Pt does have 90 day supply of medications available with 3 refills through mail service pharmacy as of 05/28/2021.

## 2021-07-17 ENCOUNTER — Ambulatory Visit
Admission: RE | Admit: 2021-07-17 | Payer: Medicare (Managed Care) | Source: Ambulatory Visit | Admitting: Gastroenterology

## 2021-07-17 ENCOUNTER — Encounter: Admission: RE | Payer: Self-pay | Source: Ambulatory Visit

## 2021-07-17 SURGERY — COLONOSCOPY

## 2021-07-17 NOTE — Telephone Encounter (Signed)
[facility] release the records on 07/09/21. The original release has been placed in scanning.

## 2021-07-23 ENCOUNTER — Other Ambulatory Visit: Payer: Self-pay | Admitting: Primary Care

## 2021-07-25 ENCOUNTER — Other Ambulatory Visit: Payer: Self-pay

## 2021-07-25 DIAGNOSIS — Z760 Encounter for issue of repeat prescription: Secondary | ICD-10-CM

## 2021-07-25 NOTE — Telephone Encounter (Signed)
Confidential Drug Report  Search Terms: Cristian Grieves, 02/04/1976   Search Date: 07/25/2021 13:19:21 PM   Searching on behalf of: [id number] - [name]   The Drug Utilization Report below displays all of the controlled substance prescriptions, if any, that your patient has filled in the last twelve months. The information displayed on this report is compiled from pharmacy submissions to the Department, and accurately reflects the information as submitted by the pharmacies.  This report was requested by: Lincoln Maxin   Reference #: [id number]   Others' Prescriptions  Patient Name: Kaylee Harris   Birth Date: [birth year]   Address: [place], [place] [address]   Sex: Female   Rx Written Rx Dispensed Drug Quantity Days Supply Prescriber Name Prescriber [place] # Payment Method Dispenser   03/19/2021 03/23/2021 tramadol hcl 50 mg tablet  42 14 Phillips, [place]. #02   03/02/2021 03/21/2021 alprazolam 1 mg tablet  15 35 Darnelle Spangle [id number] [place]. #19   03/02/2021 03/21/2021 diphenoxylate-atropine 2.5-0.025 mg tablet  240 30 Darnelle Spangle [id number] Insurance [place]. #19   03/01/2021 03/03/2021 tramadol hcl 50 mg tablet  42 14 Darnelle Spangle [id number] [place]. #02   02/08/2021 02/09/2021 tramadol hcl 50 mg tablet  42 14 Darnelle Spangle [id number] [place]. #02   01/18/2021 01/19/2021 tramadol hcl 50 mg tablet  42 14 Darnelle Spangle [id number] [place]. #02   12/26/2020 12/27/2020 tramadol hcl 50 mg tablet  42 14 Darnelle Spangle [id number] [place]. #02   12/13/2020 12/19/2020 alprazolam 1 mg tablet  15 35 Reginold Agent [id number] Insurance [place]. #19   11/27/2020 12/01/2020 tramadol hcl 50 mg tablet  42 14 Reginold Agent [id number] [place]. #02   10/31/2020 11/01/2020 tramadol  hcl 50 mg tablet  42 14 Reginold Agent [id number] [place]. #02   10/03/2020 10/09/2020 tramadol hcl 50 mg tablet  42 14 Darnelle Spangle [id number] [place]. #02   09/28/2020 10/04/2020 diphenoxylate-atropine 2.5-0.025 mg tablet  619 30 Reginold Agent [id number] [place]. #19   08/31/2020 09/05/2020 alprazolam 1 mg tablet  15 35 Reginold Agent [id number] Insurance [place]. #19   08/31/2020 09/04/2020 tramadol hcl 50 mg tablet  42 14 Reginold Agent [id number] [place]. #02   08/01/2020 08/01/2020 tramadol hcl 50 mg tablet  42 14 Reginold Agent [id number] [place]. #02   07/31/2020 07/31/2020 acetaminophen-codeine 120 mg-12 mg/5 ml solution  140m 6 MJohny Drilling GDarnell Level(MD) [place] #02      Patient Name: Kaylee Harris  Birth Date: 109-23-1977  Address: [address] [place] [address]  Sex: Female     Rx Written Rx Dispensed Drug Quantity Days Supply Prescriber Name Prescriber [place] # Payment Method Dispenser   06/08/2021 06/11/2021 alprazolam 1 mg tablet  12 28 PDarnelle [id number] [place]#19   05/28/2021 05/30/2021 tramadol hcl 50 mg tablet  42 14 PDarnelle [id number]#19   04/27/2021 05/03/2021 alprazolam 1 mg tablet  15 35 MJohny Drilling GDarnell Level(MD) [place] #19   04/27/2021 05/03/2021 tramadol hcl 50 mg tablet  [id number] MJohny Drilling GDarnell Level(MD) FCarmen  [place] #19   04/04/2021 04/05/2021 tramadol hcl 50 mg tablet  42 14 Darnelle Spangle [id number] [place]. [place]          Last office visit:   01/22/2021  Patients upcoming appointments:  No future appointments.    Recent Lab Values 10/12/2020 04/14/2019 01/04/2018 12/17/2017 10/17/2017 10/17/2017 03/31/2017   EXP DATE - 2019-08-04 9/20  02/02/2018 11-03-18 08-04-18 04/04/18   UREMK See Text - - - - - -   THCU POS - - - - - -       Recent Lab results:  GENERAL CHEMISTRY   Recent Labs     01/29/21  1427 10/12/20  1255   NA 142 142   K 4.5 4.2   CL 106 108   CO2 24 23   GAP 12 11   UN 15 13   CREAT 0.96* 0.91   GLU 80 74   CA 9.7 9.5      LIPID PROFILE   No value within the past 365 days   LIVER PROFILE   Recent Labs     01/29/21  1427 10/12/20  1255   ALT 49* 19   AST 24 17   ALK 101 87   TB 0.4 0.3      DIABETES THYROID   Recent Labs     10/12/20  1255   HA1C 6.0*    Recent Labs     10/12/20  1255   TSH 1.48         Pending/Orders Labs:  Lab Frequency Next Occurrence   COVID-19 PCR Once 10/24/2020

## 2021-07-26 ENCOUNTER — Telehealth: Payer: Self-pay

## 2021-07-26 NOTE — Telephone Encounter (Signed)
Matt from [facility] called and questioned Tramadol order sent for refill to them on 3/22- pt not a [facility] pt any longer. Also received a reorder for Cymbalta on 3/21 and that was already filled and shipped to pt. Asked Matt to not fill Tramadol order and Probation officer will seek MD review and guidance and call Matt back @ [phone].

## 2021-07-26 NOTE — Telephone Encounter (Signed)
Was unaware patient moved and no longer pt at [facility]. Writer reviewed encounter from 03/2021 confirming move. Agree with RN recommendation to not fill tramadol. [facility] will no longer be providing refills.

## 2021-08-01 ENCOUNTER — Other Ambulatory Visit: Payer: Self-pay | Admitting: Primary Care

## 2021-08-01 ENCOUNTER — Telehealth: Payer: Self-pay | Admitting: Internal Medicine

## 2021-08-01 NOTE — Telephone Encounter (Signed)
Approved Insulin Lispro 100 unit/mL vial through medicare part B until 05/05/22.

## 2021-08-27 IMAGING — US US ABDOMEN RUQ
1 series · 14 of 25 positions shown · non-contrast
Comparison: None

FINAL REPORT:
EXAM: Limited abdominal ultrasound.
HISTORY: Right upper quadrant pain
TECHNIQUE: Grayscale, color Doppler, and spectral Doppler sonography was performed of the abdomen.

[Series 1: us abdomen ruq · 14 of 80 slices shown]
[im 1/80]
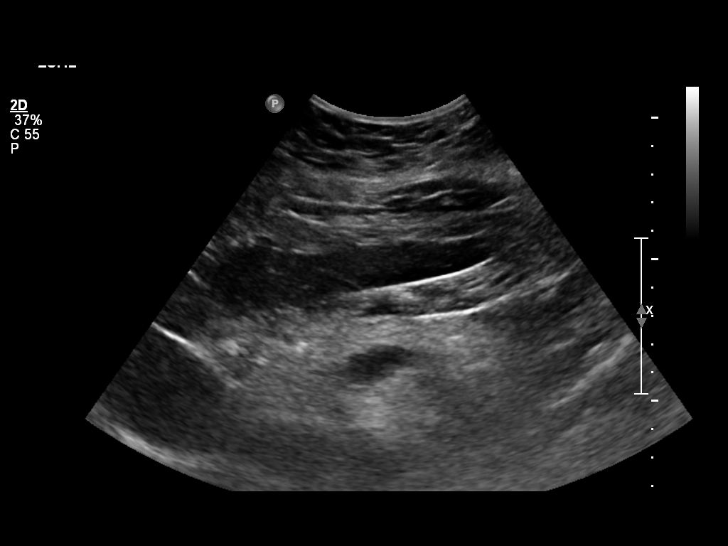
[im 7/80]
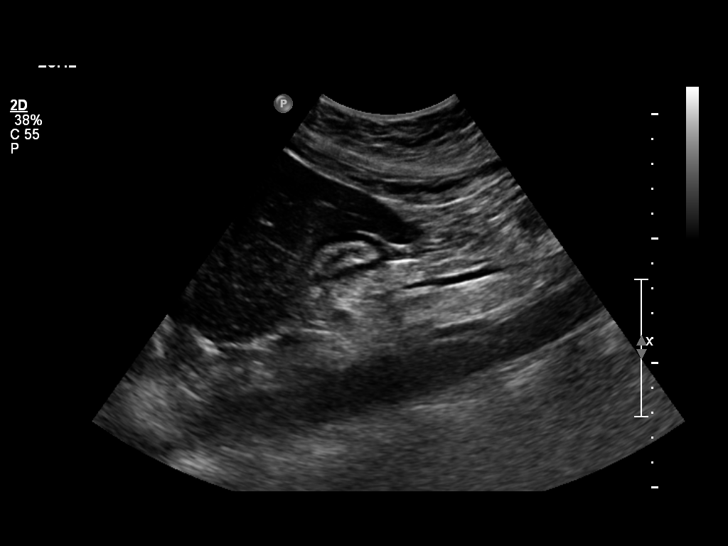
[im 14/80]
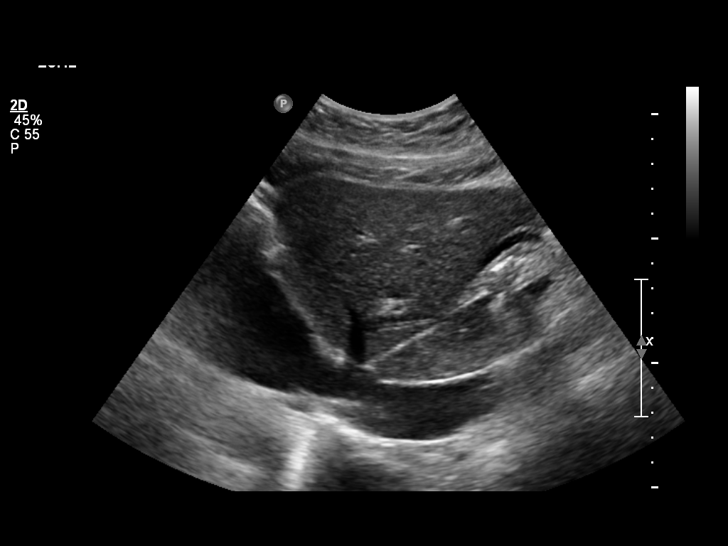
[im 20/80]
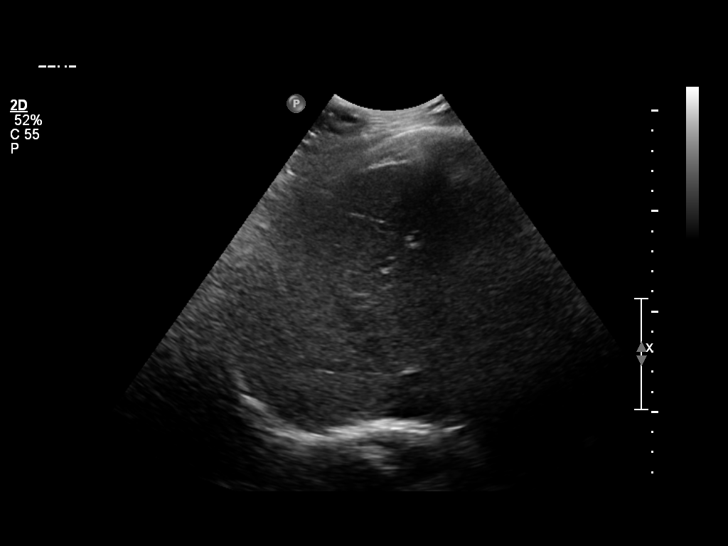
[im 27/80]
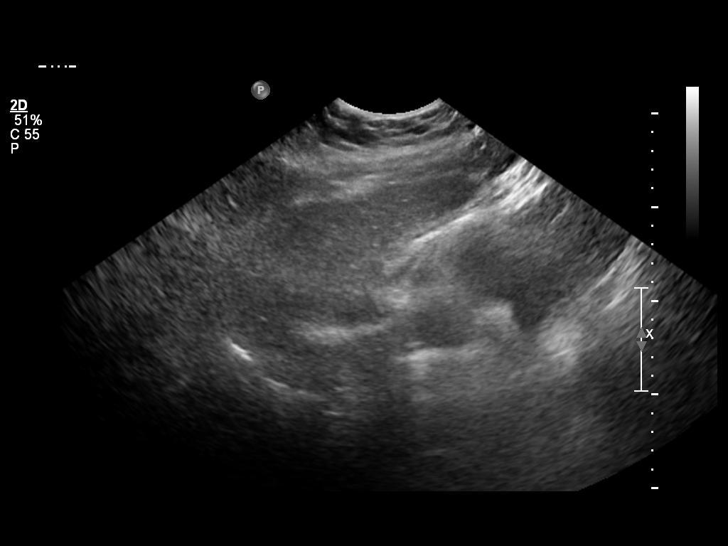
[im 30/80]
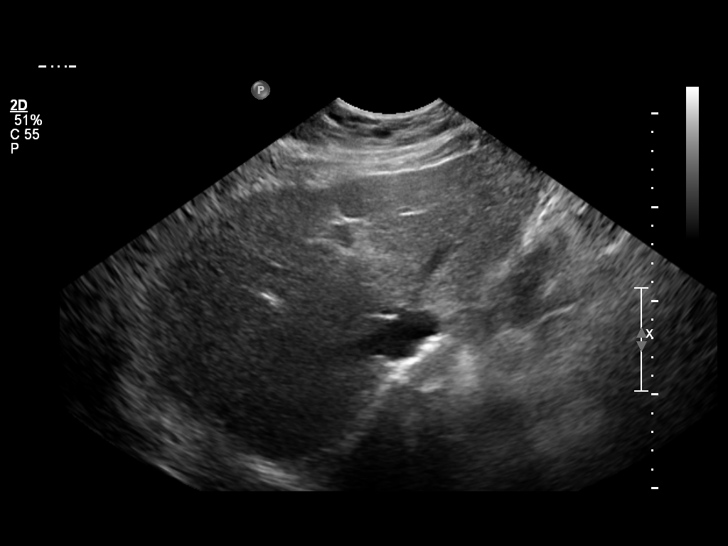
[im 37/80]
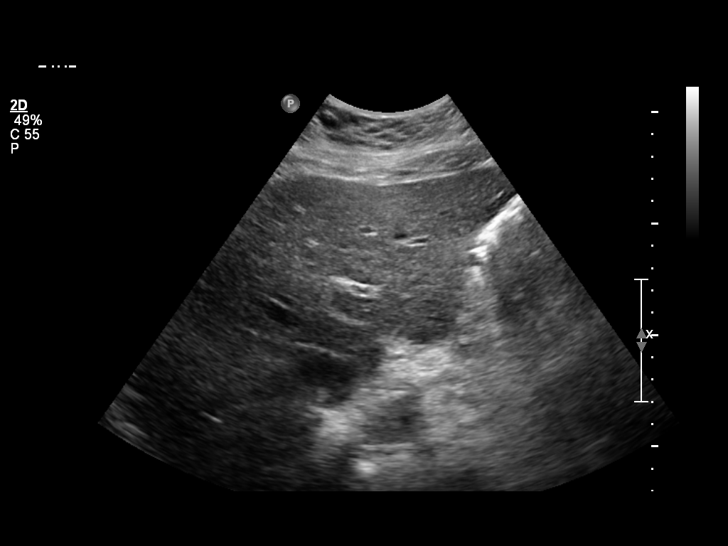
[im 43/80]
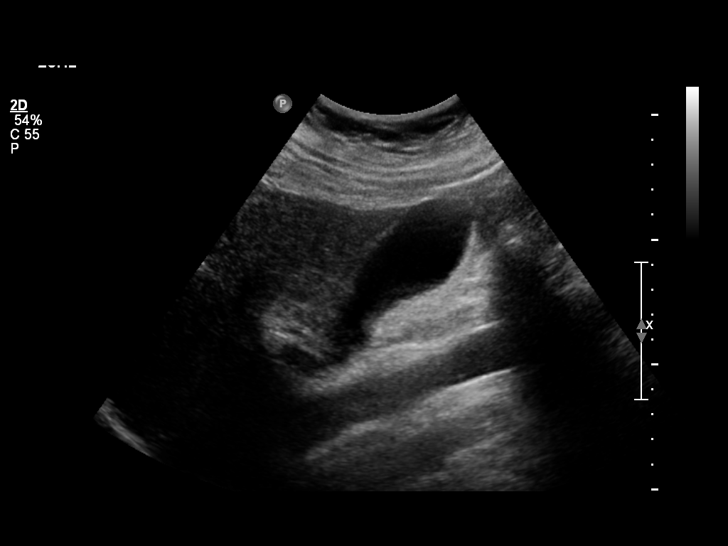
[im 50/80]
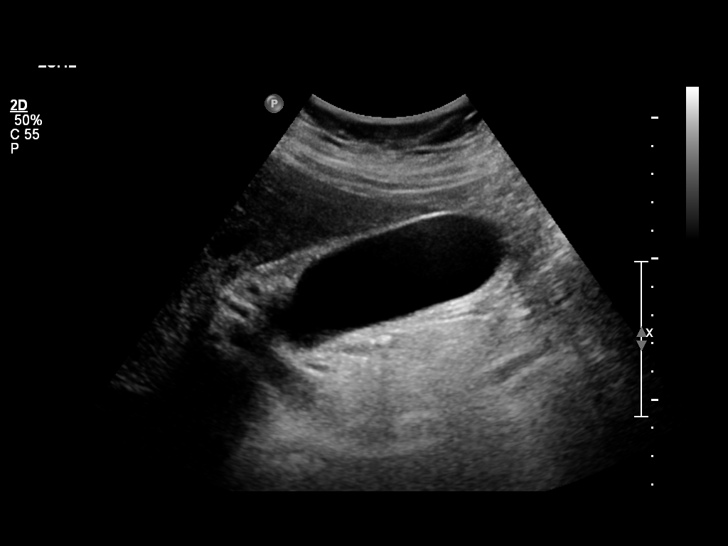
[im 53/80]
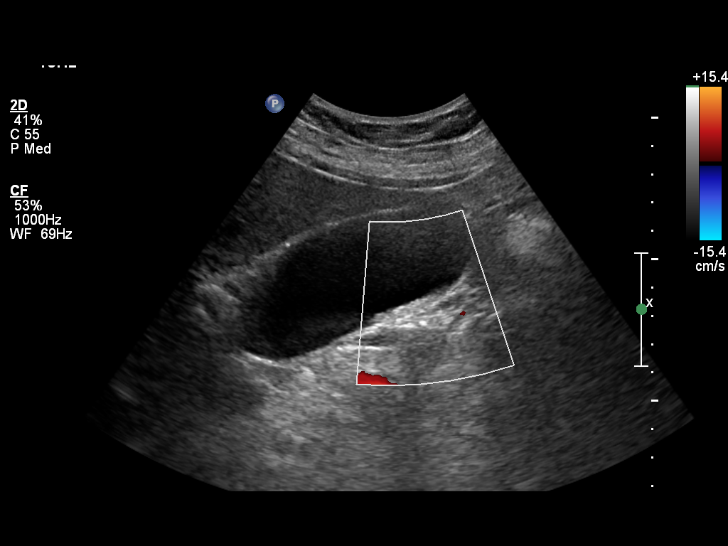
[im 60/80]
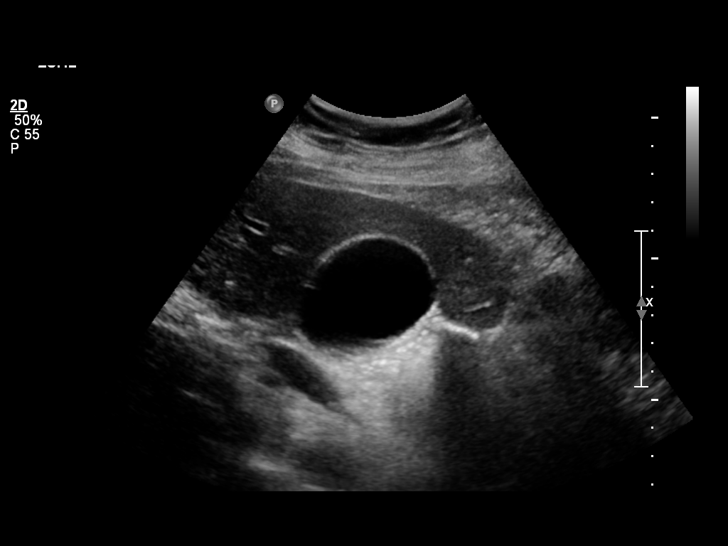
[im 66/80]
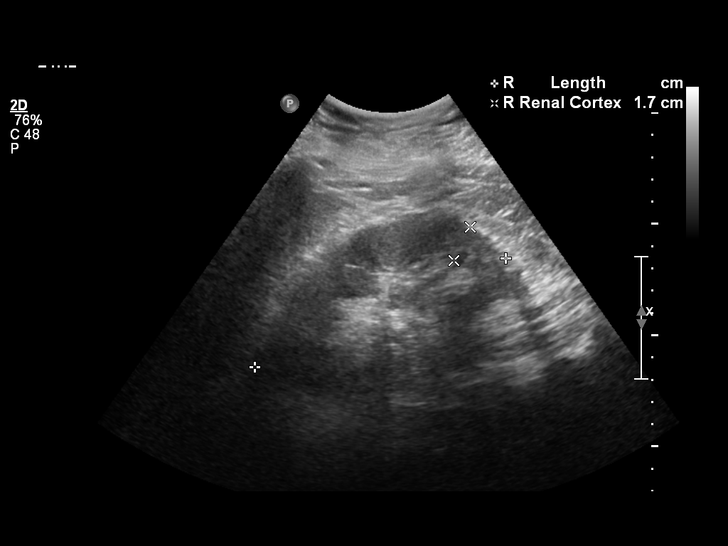
[im 73/80]
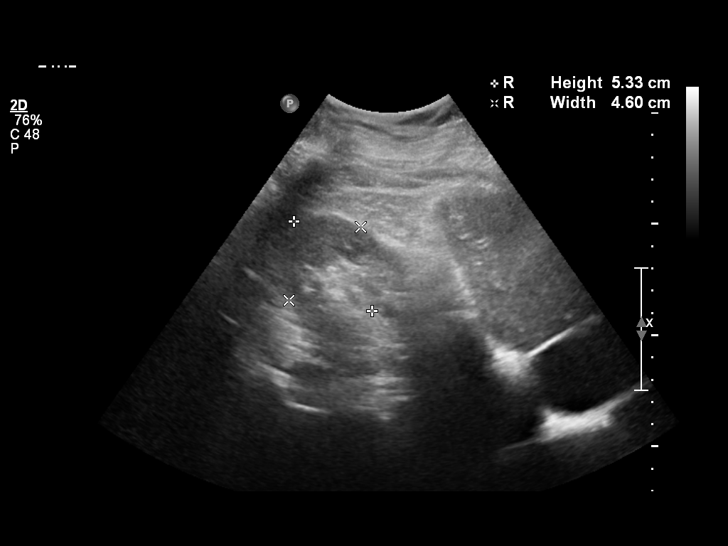
[im 80/80]
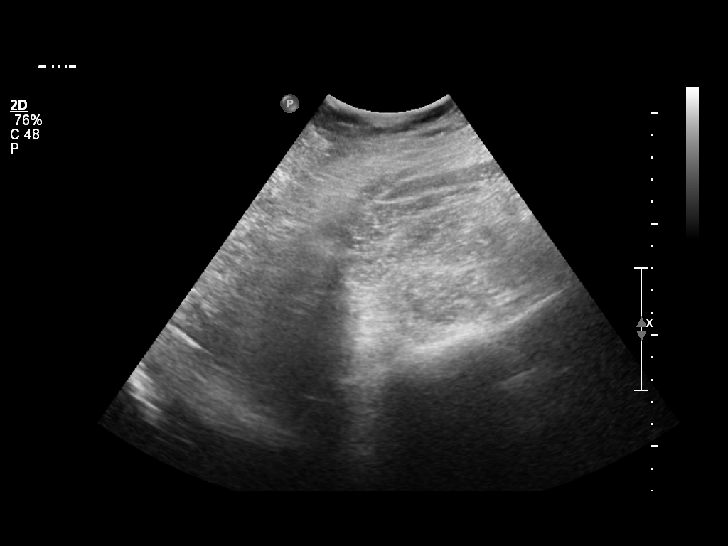

[14 of 25 positions shown; findings below may reference images not displayed]

FINDINGS: The pancreas is normal in the visualized portions..
The liver demonstrates normal  echogenicity. The liver measures 15 centimeters. No focal hepatic lesions are identified. The portal vein is patent and demonstrates hepatopedal flow. There is no evidence for intrahepatic or extrahepatic biliary ductal dilatation.
The common bile duct measures 5 mm in transverse diameter.
Biliary sludge noted. The gallbladder wall measures 3 mm, and there is a negative sonographic Murphy sign.
The right kidney measures 12 x 5 x 5 cm. There is no hydronephrosis and no stones..
The IVC is normal.
There is no ascites.
IMPRESSION: Biliary sludge. No sonographic evidence of acute cholecystitis.

## 2021-08-31 ENCOUNTER — Encounter: Payer: Self-pay | Admitting: Internal Medicine

## 2021-09-10 IMAGING — MG MAMMO BREAST SCREENING TOMOSYNTHESIS 3D BILATERAL
8 of 14 series · 8 of 34 positions shown · non-contrast
Comparison: None

This is a summary report. The complete report is available in the patient's medical record. If you cannot access the medical record, please contact the sending organization for a detailed fax or copy.
FINAL REPORT:
EXAM: MAMMO BREAST SCREENING TOMOSYNTHESIS 3D BILATERAL
CLINICAL HISTORY: Screening mammogram
TECHNIQUE: 2-D/3-D digital mammography was performed with CAD. Routine views were obtained.

[L CC synth-2D]
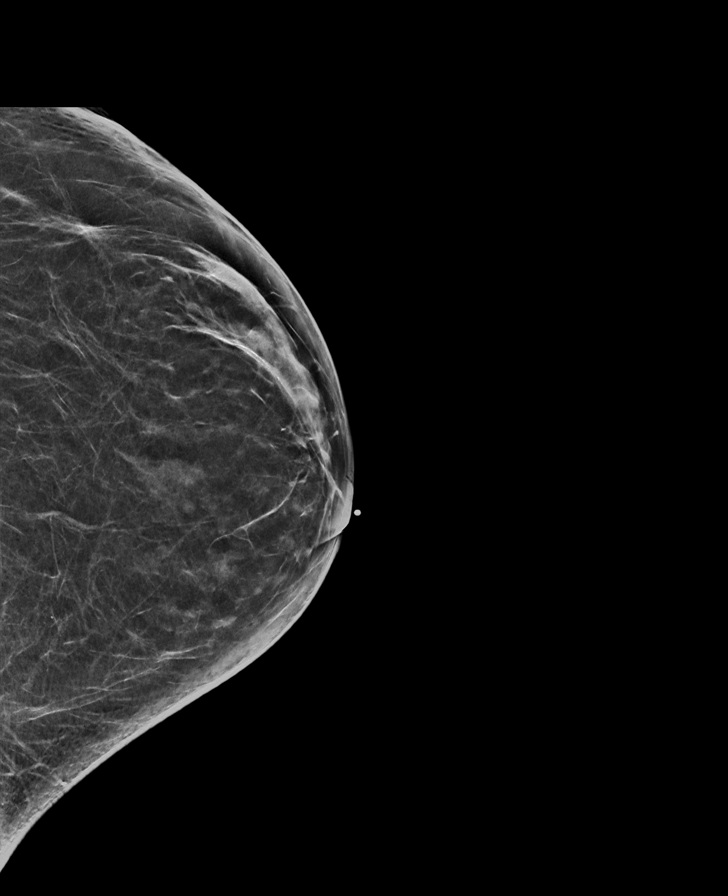

[L MLO]
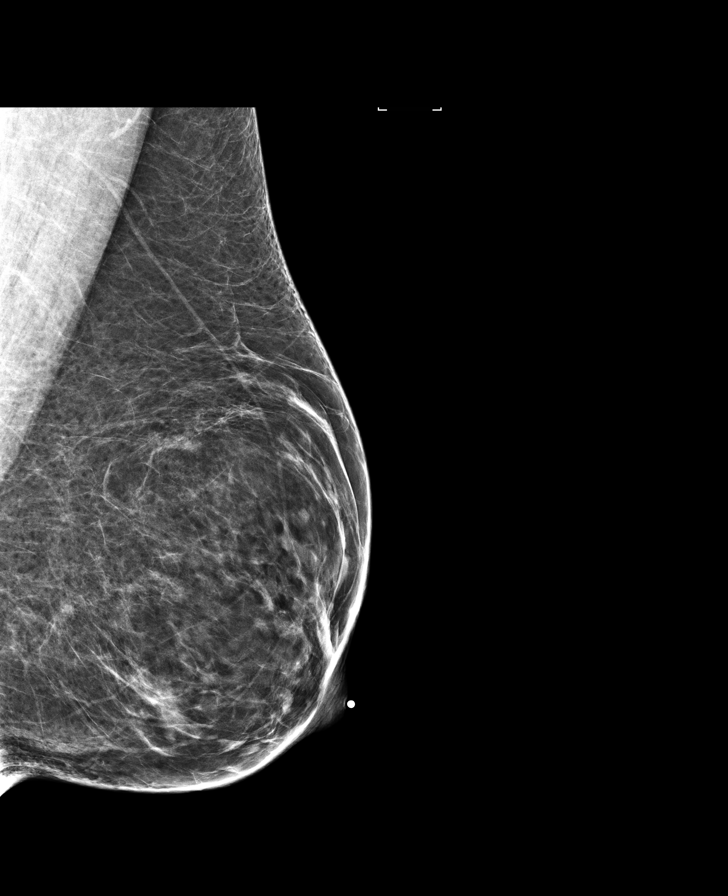

[R MLO synth-2D]
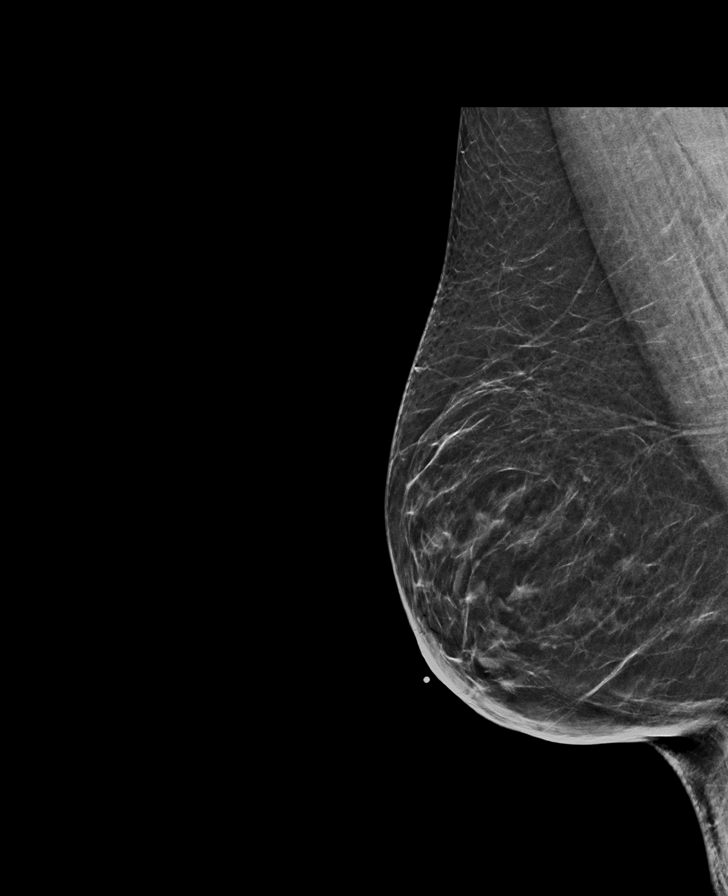

[L CC]
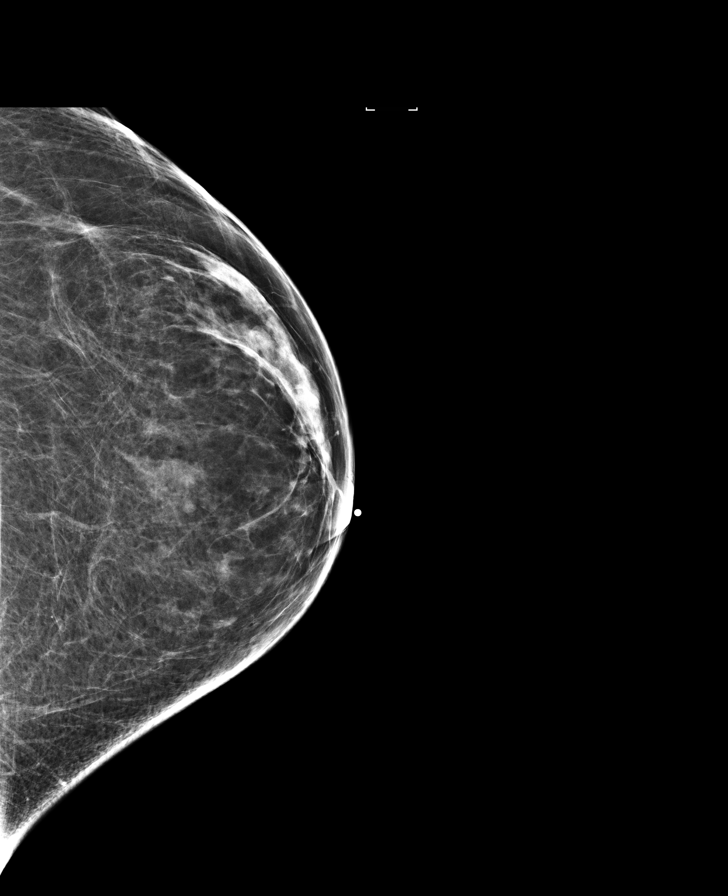

[R CC synth-2D (1 of 2)]
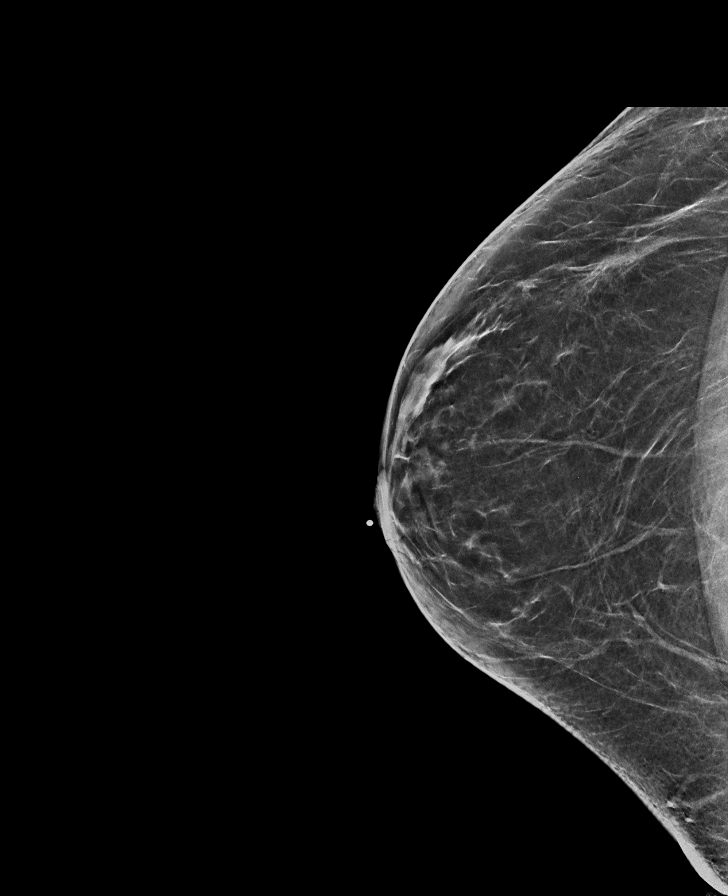

[R CC synth-2D (2 of 2)]
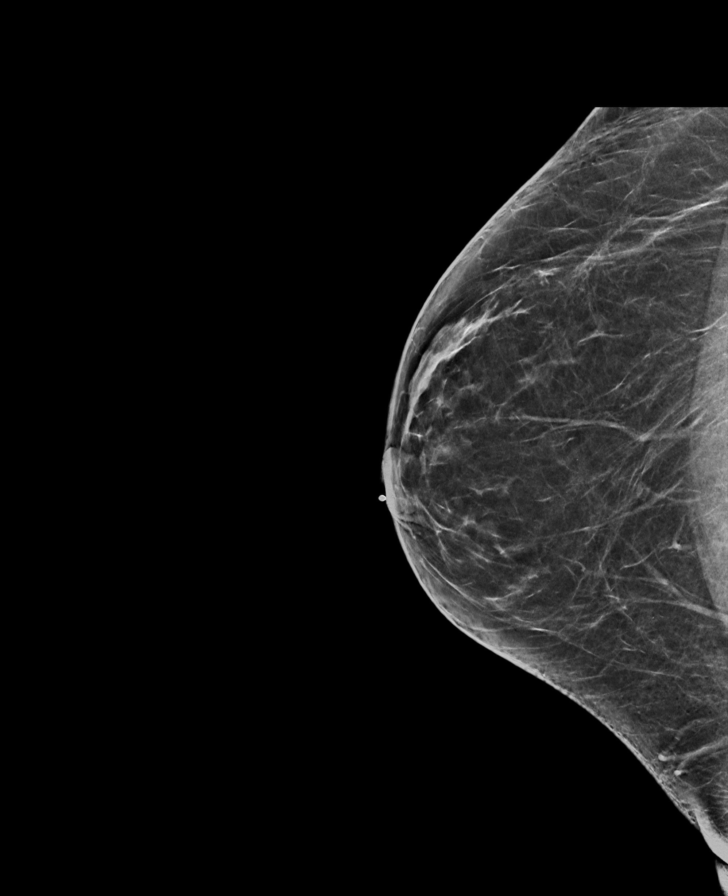

[L MLO synth-2D]
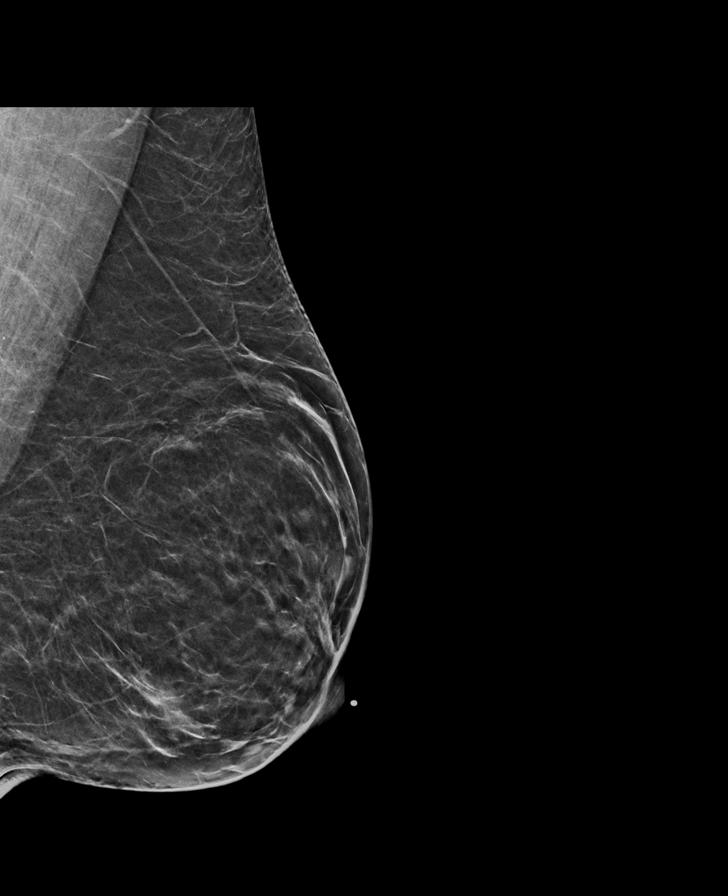

[R MLO]
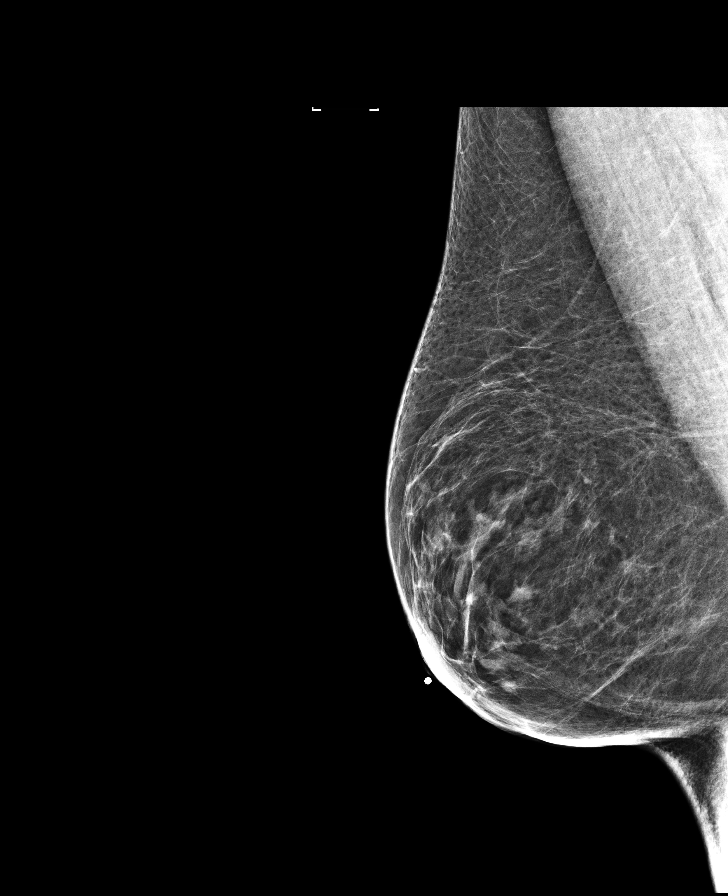

[8 of 34 positions shown; findings below may reference images not displayed]

FINDINGS: No suspicious masses, suspicious microcalcifications, or unexplained distortion.
IMPRESSION: No evidence of malignancy.
BI-RADS Category 1: Negative
Breast Density B: There are scattered areas of fibroglandular density
Recommendation: Annual screening
Is the patient pregnant?
No

## 2021-10-15 ENCOUNTER — Ambulatory Visit: Payer: Medicare (Managed Care) | Admitting: Primary Care

## 2021-11-22 IMAGING — MR MRI LUMBAR SPINE WITHOUT CONTRAST
4 of 7 series · 27 of 48 positions shown · non-contrast
Comparison: None.

FINAL REPORT:
MRI LUMBAR SPINE WITHOUT CONTRAST
INDICATION: Other spondylosis with radiculopathy, lumbar region .
TECHNIQUE: Multisequence multiplanar magnetic resonance imaging of the lumbar spine without contrast.

[Series 6001: T2 · sagittal · 4.0mm · 0.62mm/px · 6 of 17 slices shown (1 of 2)]
[im 1/17]
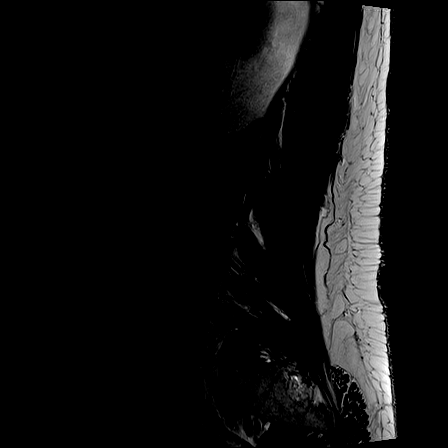
[im 4/17]
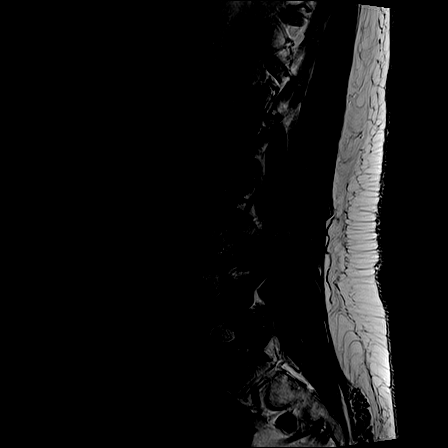
[im 7/17]
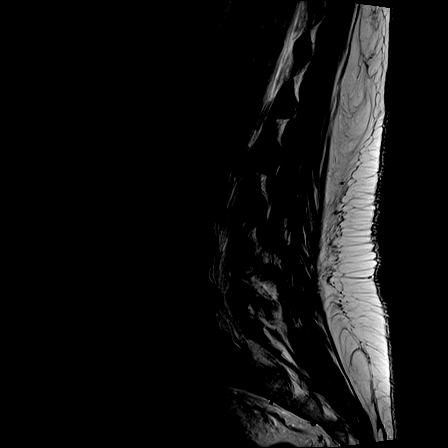
[im 10/17]
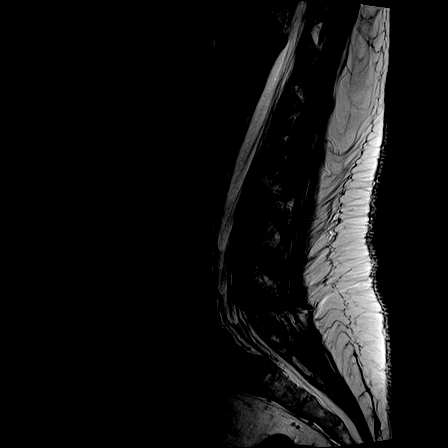
[im 13/17]
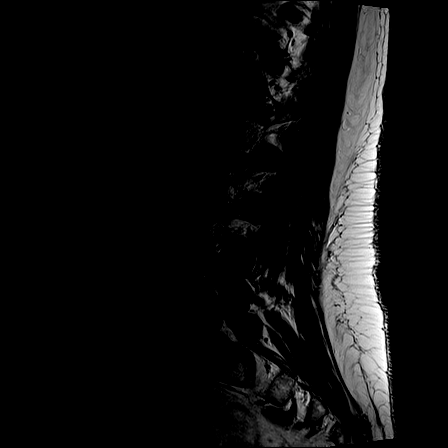
[im 17/17]
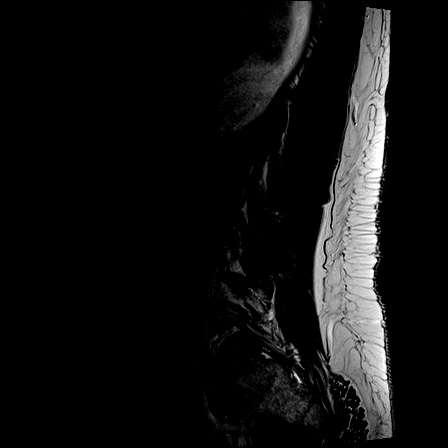

[Series 8001: T1 · sagittal · 4.0mm · 0.67mm/px · 6 of 17 slices shown (1 of 2)]
[im 1/17]
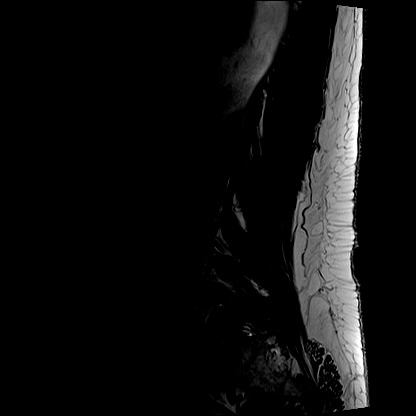
[im 4/17]
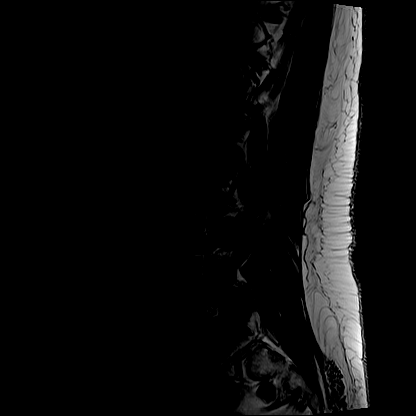
[im 7/17]
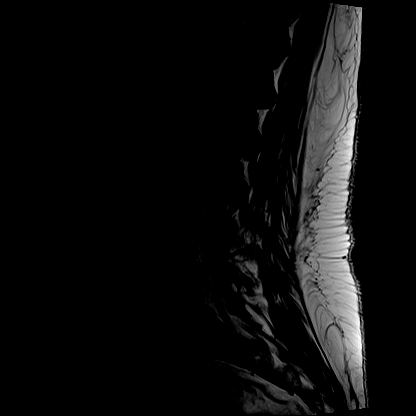
[im 10/17]
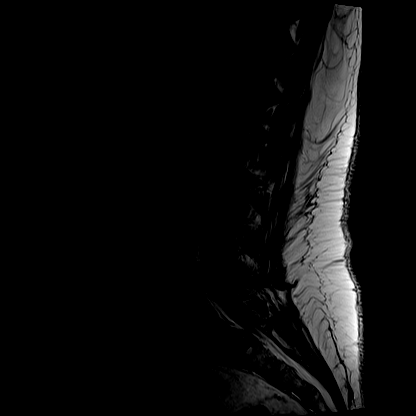
[im 13/17]
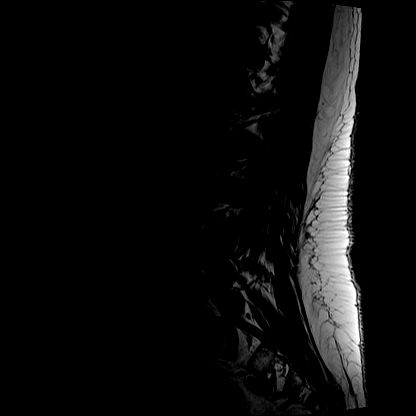
[im 17/17]
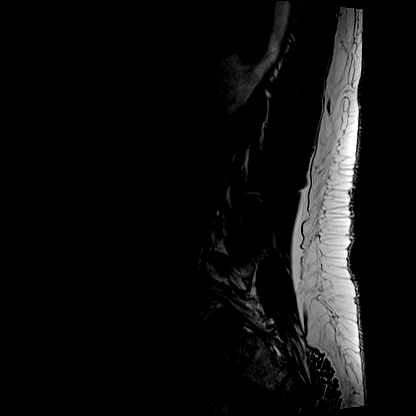

[Series 9001: T2 · axial · 4.0mm · 0.49mm/px · z∈[-171,+37]mm · 9 of 32 slices shown (2 of 2)]
[im 1/32]
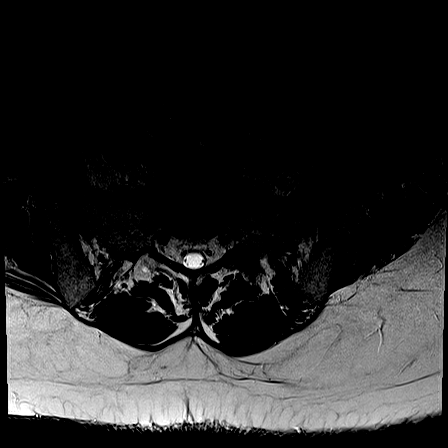
[im 6/32]
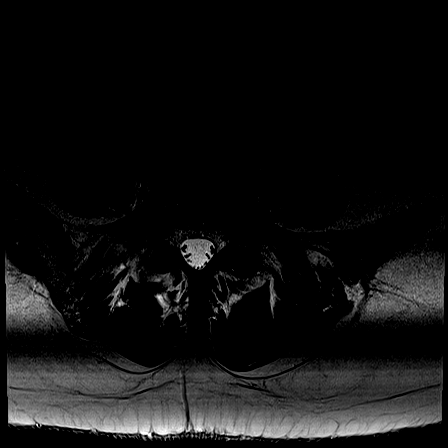
[im 9/32]
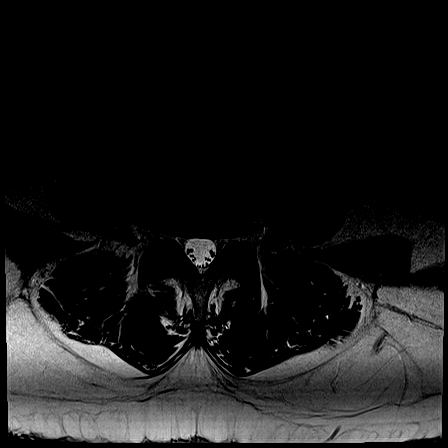
[im 15/32]
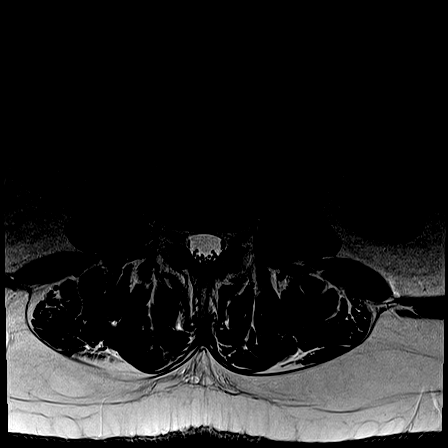
[im 17/32]
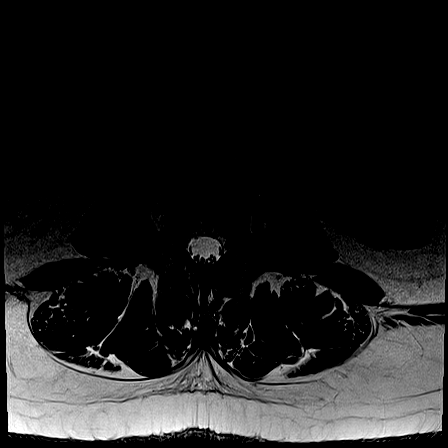
[im 23/32]
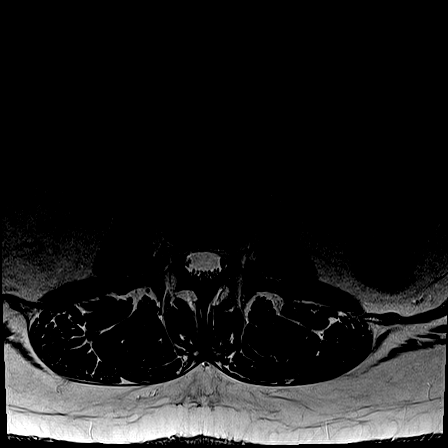
[im 26/32]
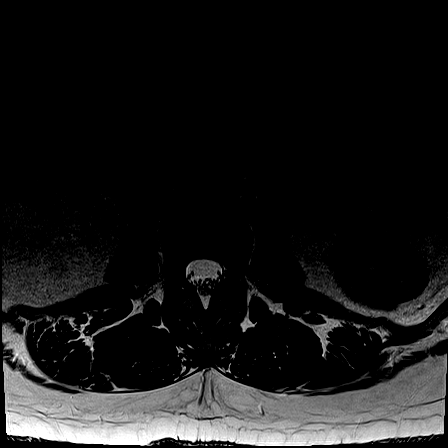
[im 29/32]
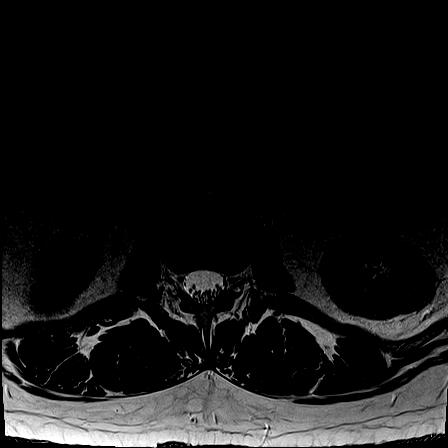
[im 32/32]
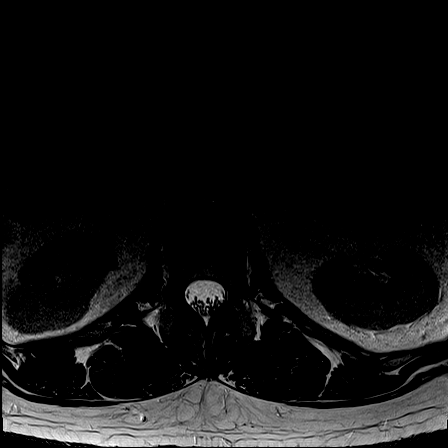

[T1 · axial · 4.0mm · 0.49mm/px · z∈[-171,+24]mm · 6 of 32 slices shown (2 of 2)]
[im 1/32]
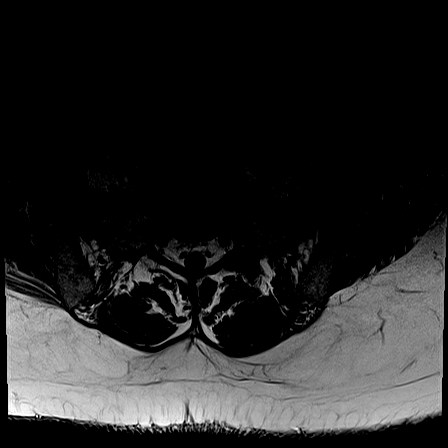
[im 6/32]
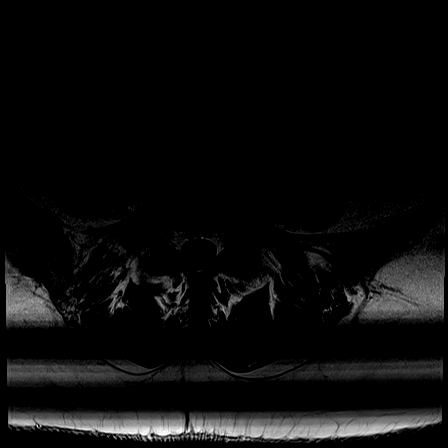
[im 9/32]
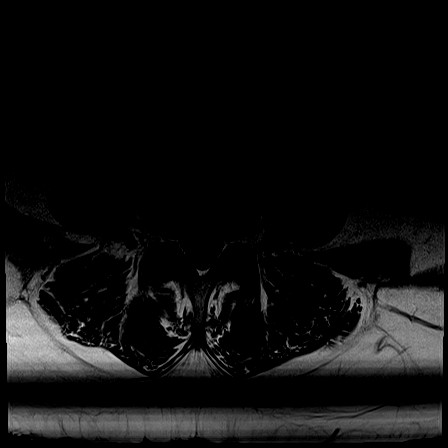
[im 15/32]
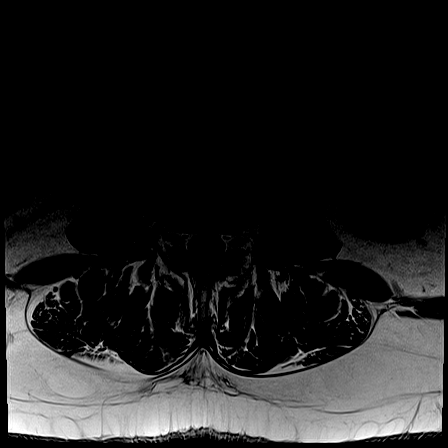
[im 17/32]
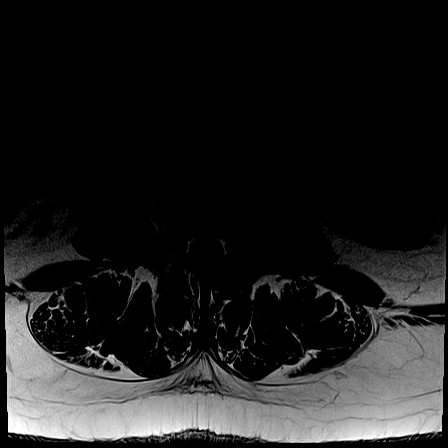
[im 29/32]
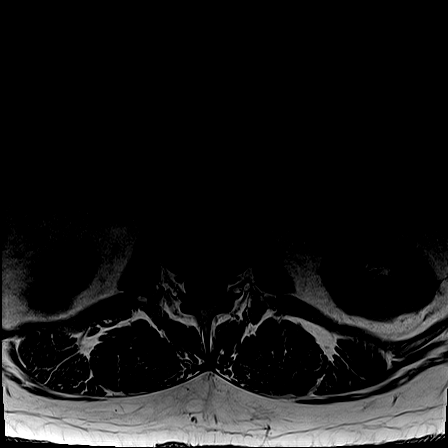

[27 of 48 positions shown; findings below may reference images not displayed]

FINDINGS: Five lumbar-type vertebral bodies are assumed counting from the sacrum at the lowest articulating disc..
Alignment: Normal
Vertebral Bodies: Normal in height
Marrow Signal: Expected
Intervertebral Discs: Minimal disc space height loss and disc desiccation at L3-4 and L4-5.
Conus Medullaris:  Terminates at a normal level
Cauda Equina: Normal
Paraspinal Soft Tissues: Normal
Individual Levels:
T12-L1: Normal
L1-L2: Normal
L2-L3:  Normal
L3-L4:  Mild facet arthrosis. No spinal canal narrowing. Minimal bilateral foraminal narrowing.
L4-L5:  Mild facet arthrosis. Mild circumferential disc bulge. No significant spinal canal narrowing. Mild bilateral neural foraminal narrowing.
L5-S1:  Mild facet arthrosis. Mild circumferential disc bulge. No significant spinal canal narrowing. Mild left neural foraminal narrowing.
IMPRESSION: Mild degenerative changes of the lower lumbar spine as described above.
Is the patient pregnant?
No

## 2022-02-17 ENCOUNTER — Other Ambulatory Visit: Payer: Self-pay | Admitting: Primary Care

## 2022-02-17 DIAGNOSIS — J45909 Unspecified asthma, uncomplicated: Secondary | ICD-10-CM

## 2022-02-18 NOTE — Telephone Encounter (Signed)
Last office visit:   01/22/2021  Patients upcoming appointments:  No future appointments.        10/12/2020     3:33 PM 04/14/2019     7:46 PM 01/04/2018    12:03 PM 12/17/2017     3:50 PM 10/17/2017     1:03 AM 10/17/2017    12:00 AM 03/31/2017     2:25 PM   Recent Lab Values   Exp date  2019-08-04  9/20  02/02/2018  11-03-18  08-04-18  04/04/18    Remark,UR See Text          THC Metabolite,UR POS              Recent Lab results:  GENERAL CHEMISTRY   No value within the past 365 days   LIPID PROFILE   No value within the past 365 days   LIVER PROFILE   No value within the past 365 days   DIABETES THYROID   No value within the past 365 days No value within the past 365 days      Pending/Orders Labs:  Lab Frequency Next Occurrence

## 2022-04-05 IMAGING — MR MRI CERVICAL SPINE WITHOUT CONTRAST
7 series · 48 of 48 positions shown · non-contrast
Comparison: none

Chronic pain, arthritis
FINAL REPORT:
HISTORY: cervical spondylosis with radiculopathy.
MRI cervical spine without contrast.
TECHNIQUE: Multiplanar multisequence MR imaging obtained of the cervical spine without contrast.

[Series 3001: survey_mpr_sag · sagittal · 1.7mm · 1.67mm/px · 5 of 15 slices shown]
[im 1/15]
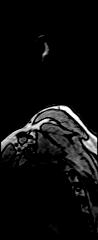
[im 4/15]
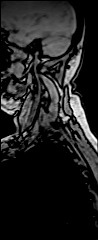
[im 8/15]
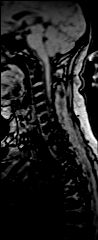
[im 11/15]
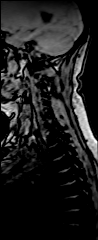
[im 15/15]
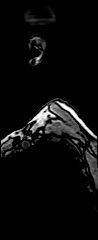

[Series 4001: survey_mpr_(person_name) · axial · 1.7mm · 1.67mm/px · z∈[-150,+150]mm · 2 of 7 slices shown]
[im 1/7]
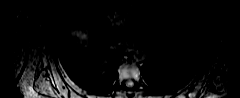
[im 7/7]
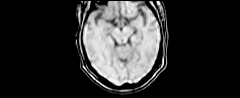

[Series 5001: T2 · sagittal · 3.0mm · 0.38mm/px · 5 of 15 slices shown (1 of 2)]
[im 1/15]
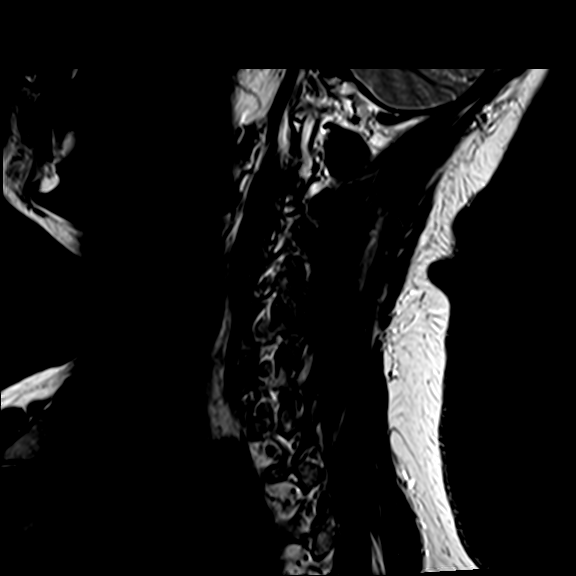
[im 4/15]
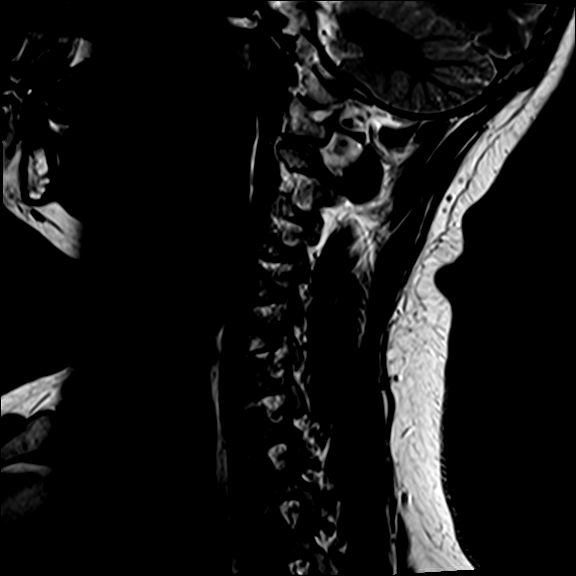
[im 8/15]
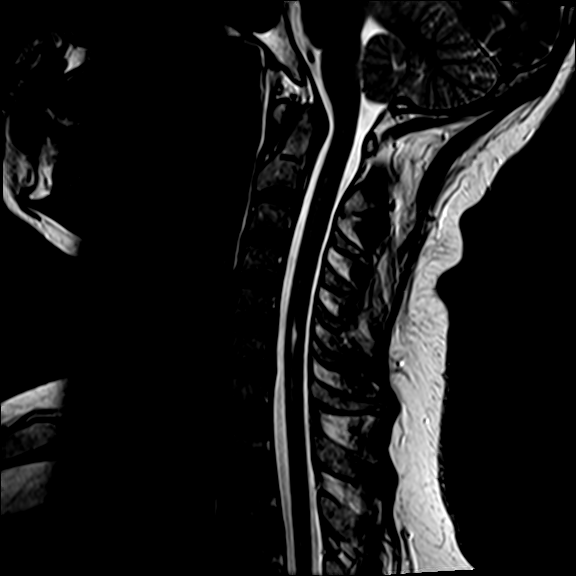
[im 11/15]
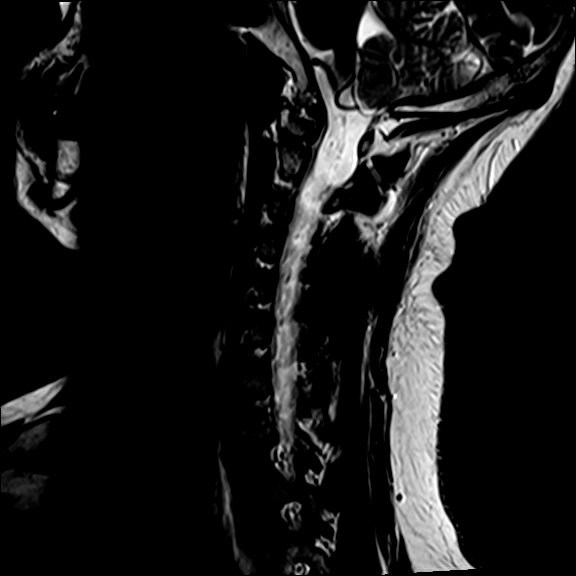
[im 15/15]
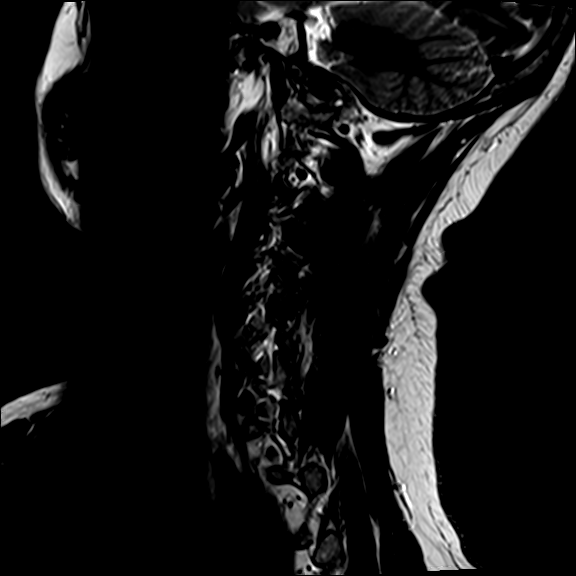

[Series 6001: T1 · sagittal · 3.0mm · 0.69mm/px · 5 of 15 slices shown]
[im 1/15]
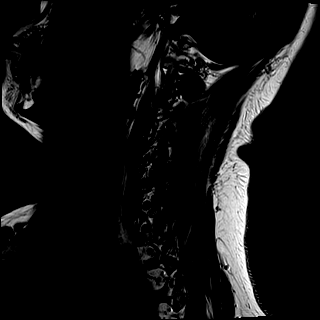
[im 4/15]
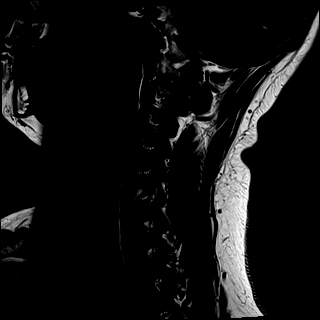
[im 8/15]
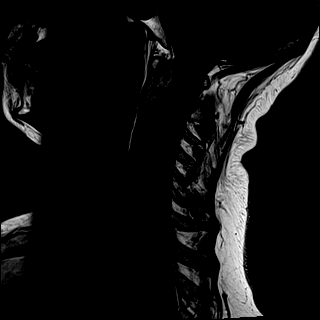
[im 11/15]
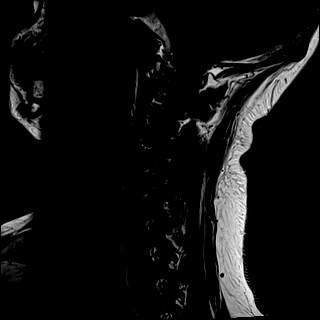
[im 15/15]
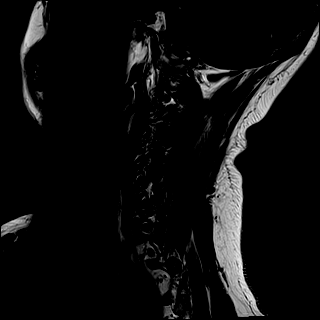

[Series 7001: STIR · sagittal · 3.0mm · 0.86mm/px · 5 of 15 slices shown]
[im 1/15]
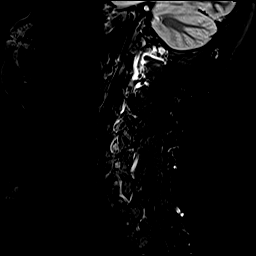
[im 4/15]
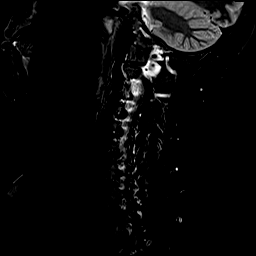
[im 8/15]
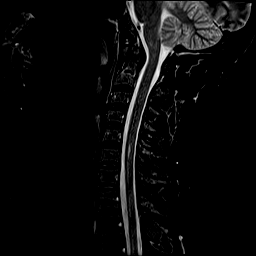
[im 11/15]
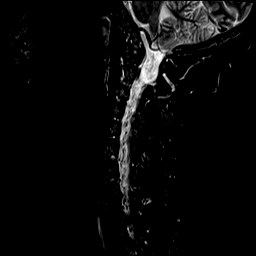
[im 15/15]
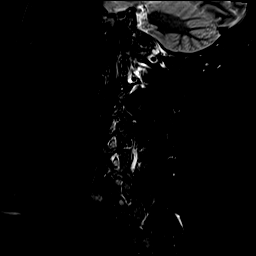

[Series 8001: GRE · axial · 3.0mm · 0.70mm/px · z∈[-47,+53]mm · 10 of 32 slices shown]
[im 1/32]
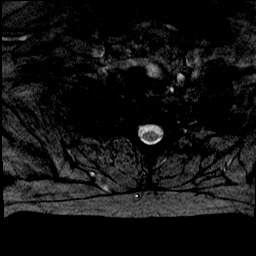
[im 4/32]
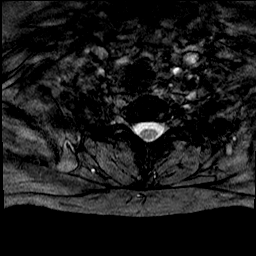
[im 7/32]
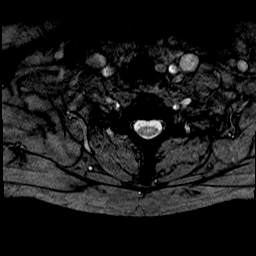
[im 11/32]
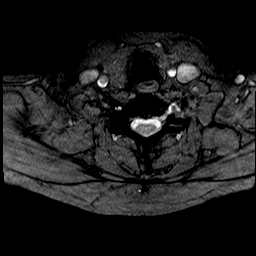
[im 14/32]
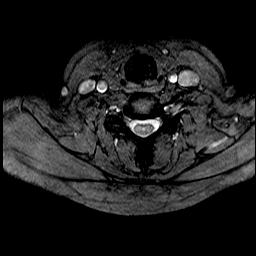
[im 18/32]
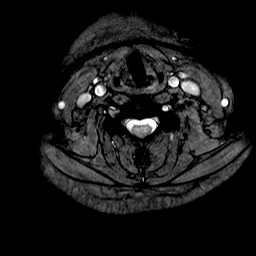
[im 21/32]
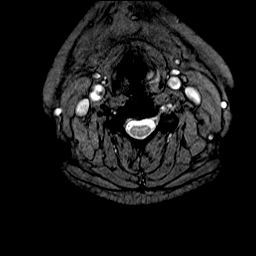
[im 25/32]
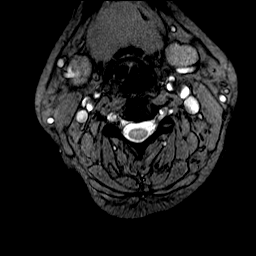
[im 28/32]
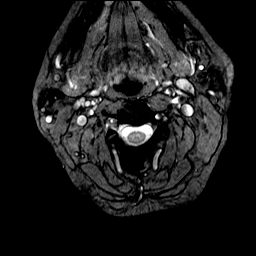
[im 32/32]
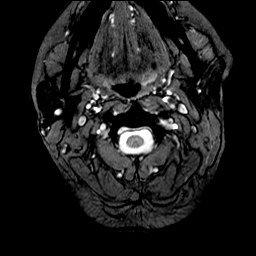

[Series 9001: T2 · axial · 2.0mm · 0.70mm/px · z∈[-43,+49]mm · 16 of 48 slices shown (2 of 2)]
[im 1/48]
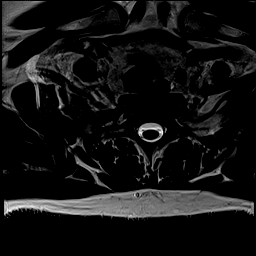
[im 4/48]
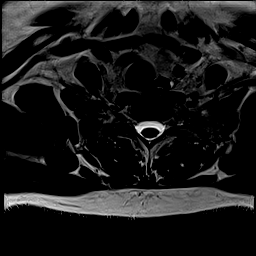
[im 7/48]
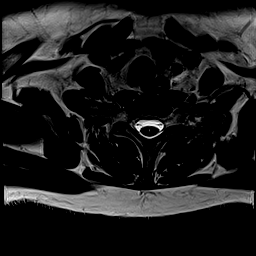
[im 10/48]
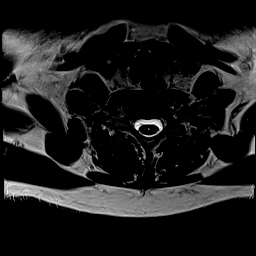
[im 13/48]
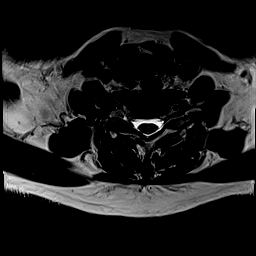
[im 16/48]
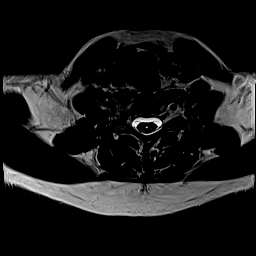
[im 19/48]
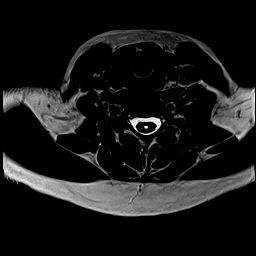
[im 22/48]
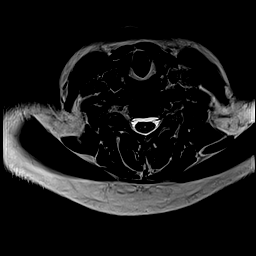
[im 26/48]
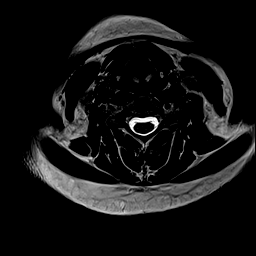
[im 29/48]
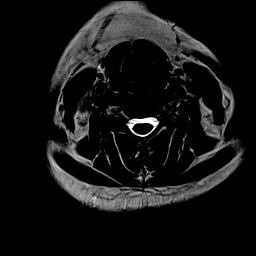
[im 32/48]
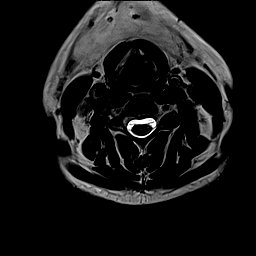
[im 35/48]
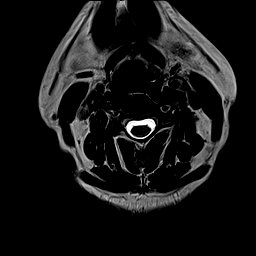
[im 38/48]
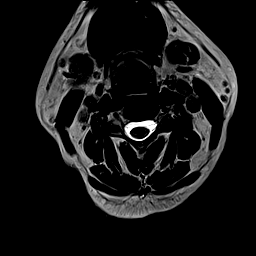
[im 41/48]
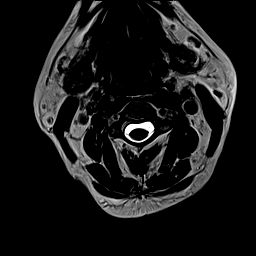
[im 44/48]
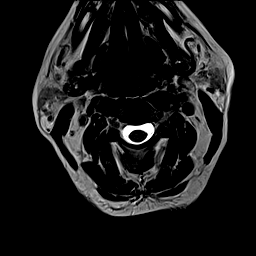
[im 48/48]
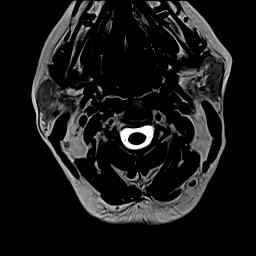

[48 of 48 positions shown; findings below may reference images not displayed]

FINDINGS: Sagittal imaging performed from occiput through mid T3. Minimal age-related degenerative endplate changes C4-C7. Vertebral body height, shape, signal, and alignment are maintained. Disc spaces are essentially preserved. Mild degenerative facet arthropathy is seen at C4-5 on the left and C5-6 on the right. The craniovertebral junction is normal in appearance. A small 2 x 2 mm T2 hyperintense focus within the central intramedullary cervical cord extending from the inferior C5 through superior C7 is most suggestive of a small syrinx (series 3992 image 19 and series 9889 image 8). The cervical cord is otherwise satisfactory in contour and signal intensity.
Axial MR imaging demonstrates the following:
C2-3: No disc herniation. No central spinal or neuroforaminal stenosis.
C3-4: No disc herniation. No central spinal or neuroforaminal stenosis.
C4-5: No disc herniation. No central spinal or neuroforaminal stenosis.
C5-6: No disc herniation. No central spinal or neuroforaminal stenosis.
C6-7: No disc herniation. No central spinal or neuroforaminal stenosis.
C7-T1: No disc herniation. No central spinal or neuroforaminal stenosis.
IMPRESSION: 1. Small 2 x 2 mm syrinx extending from inferior C5 through superior C7.
2. Minimal age-related degenerative endplate changes C4-C7.
3. Mild degenerative facet arthropathy C4-5 and C5-6 on the right.
Is the patient pregnant?
No

## 2022-08-01 IMAGING — CT CT ABDOMEN PELVIS WITHOUT CONTRAST
2 of 3 series · 17 of 46 positions shown, 19 images · non-contrast
Comparison: None

Abdominal pain/nausea/vomiting x's 2 days
FINAL REPORT:
EXAM: CT ABDOMEN PELVIS WITHOUT CONTRAST
INDICATION: Abdominal pain, acute, nonlocalized

[Series 2: renal stone · axial · 0.87mm/px · z∈[-530,-57]mm · 14 of 218 slices shown, 16 images]
[im 15/218  soft-tissue]
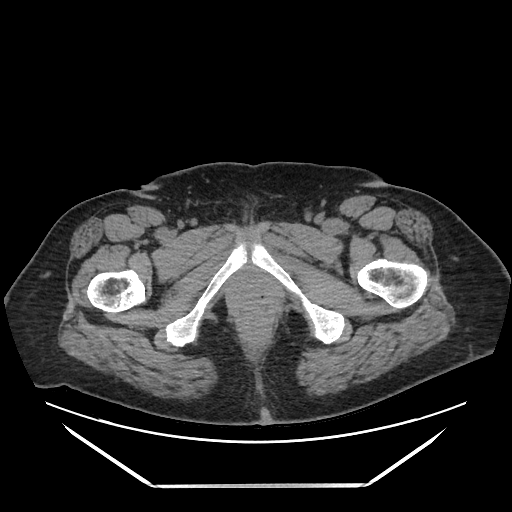
[im 15/218  bone]
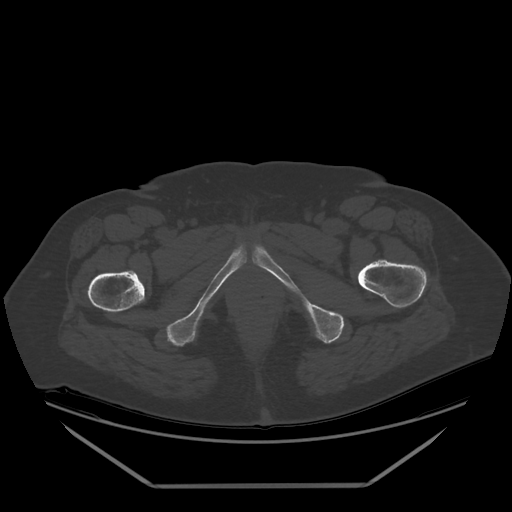
[im 29/218  soft-tissue]
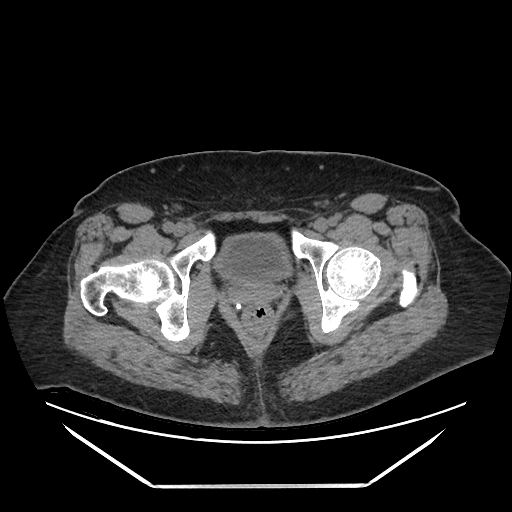
[im 43/218  soft-tissue]
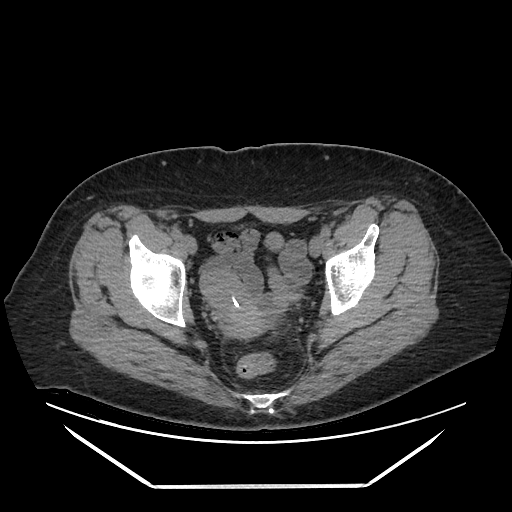
[im 57/218  soft-tissue]
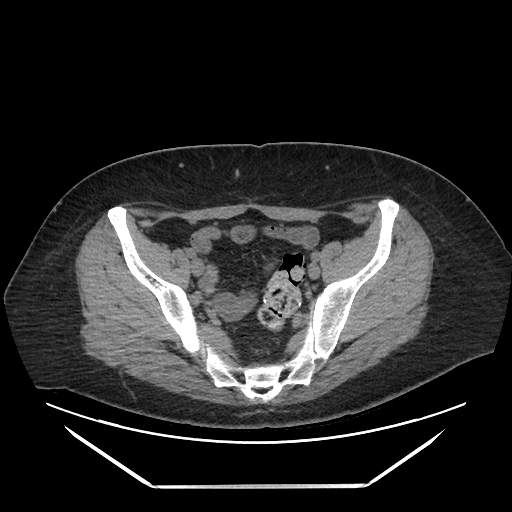
[im 71/218  soft-tissue]
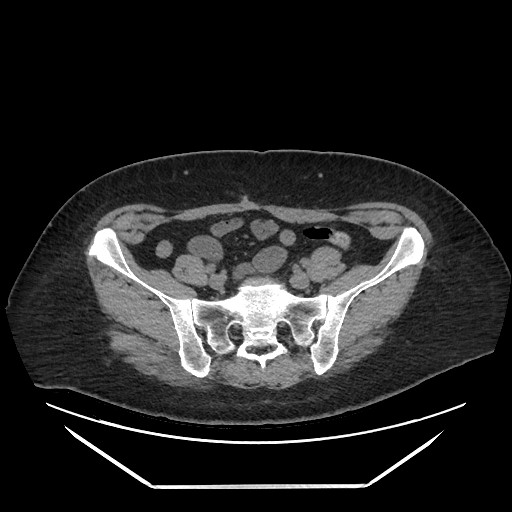
[im 85/218  soft-tissue]
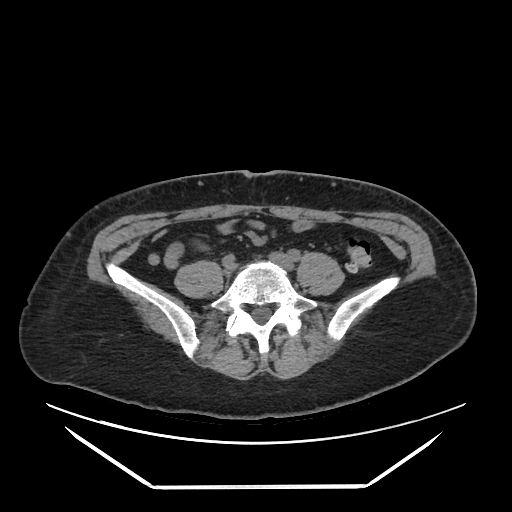
[im 99/218  soft-tissue]
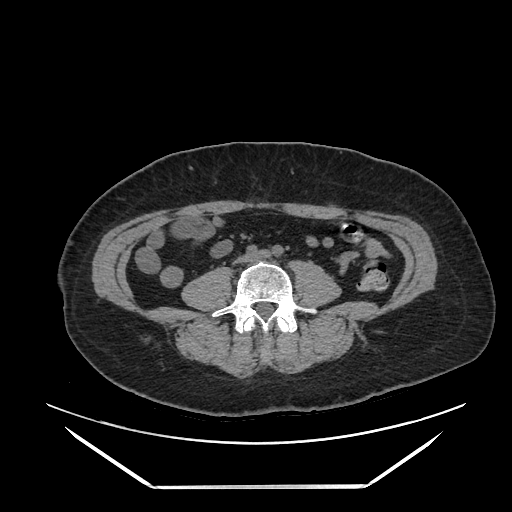
[im 120/218  soft-tissue]
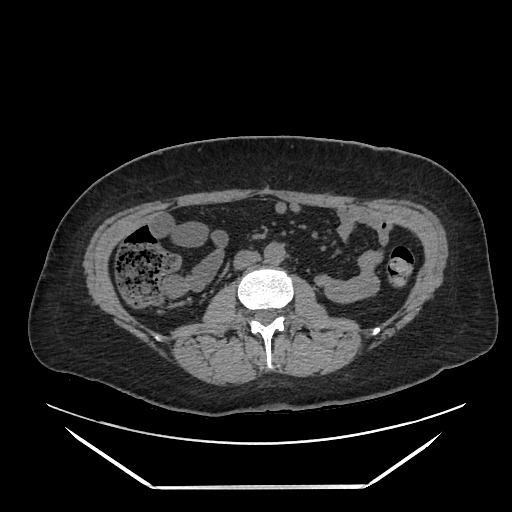
[im 134/218  soft-tissue]
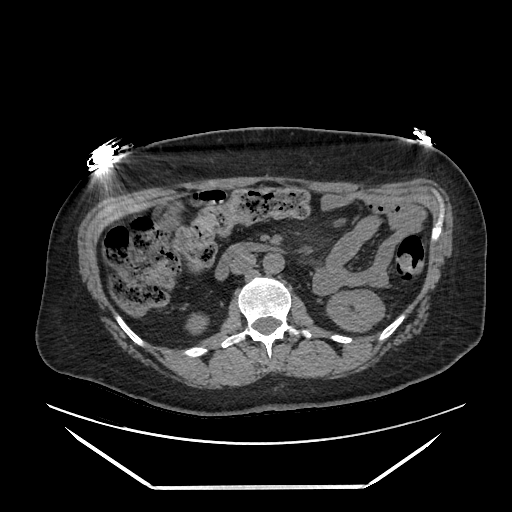
[im 134/218  bone]
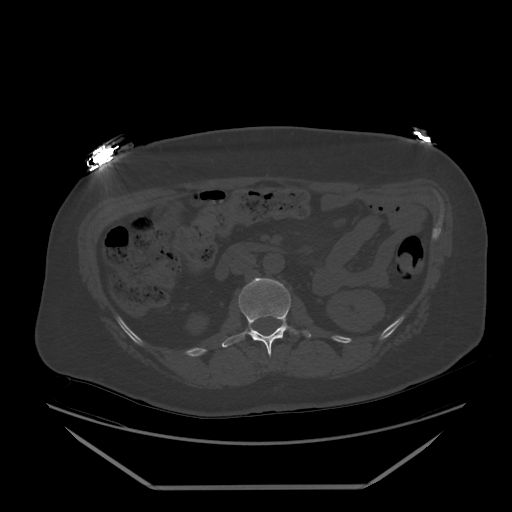
[im 148/218  soft-tissue]
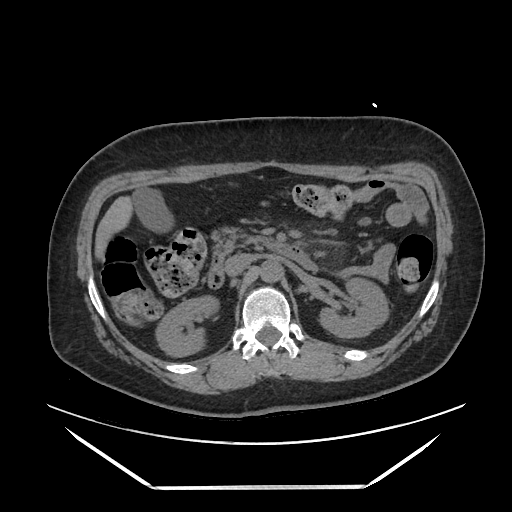
[im 162/218  soft-tissue]
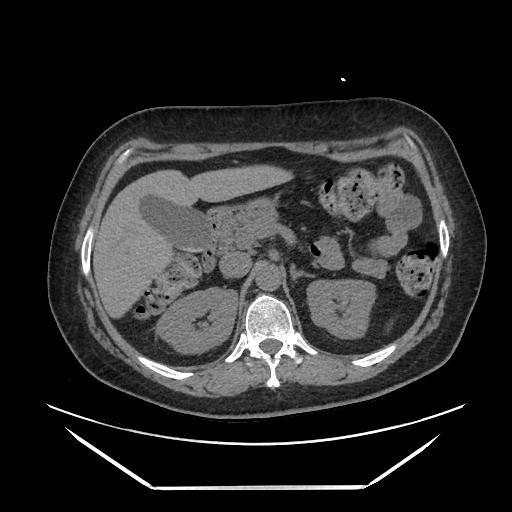
[im 176/218  soft-tissue]
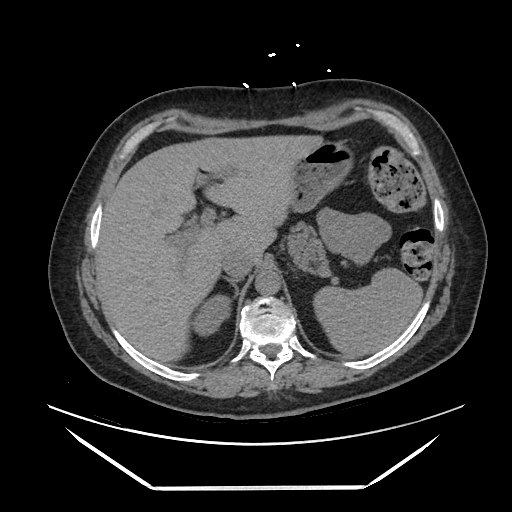
[im 190/218  soft-tissue]
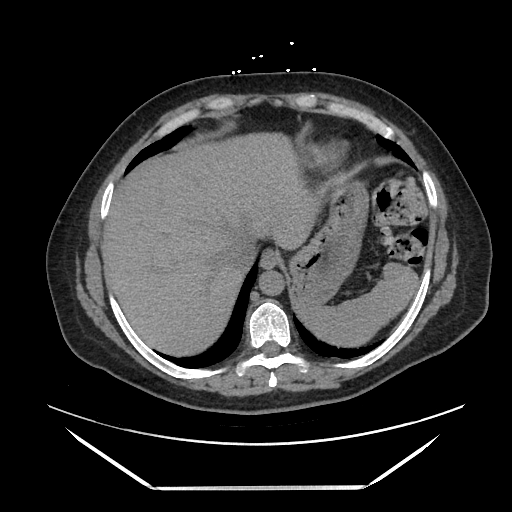
[im 204/218  soft-tissue]
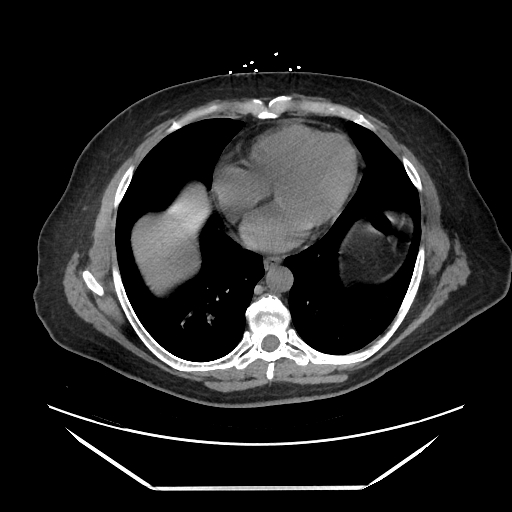

[Series 602: sag standard 2x2 · sagittal · 1.06mm/px · 3 of 223 slices shown]
[im 75/223  soft-tissue]
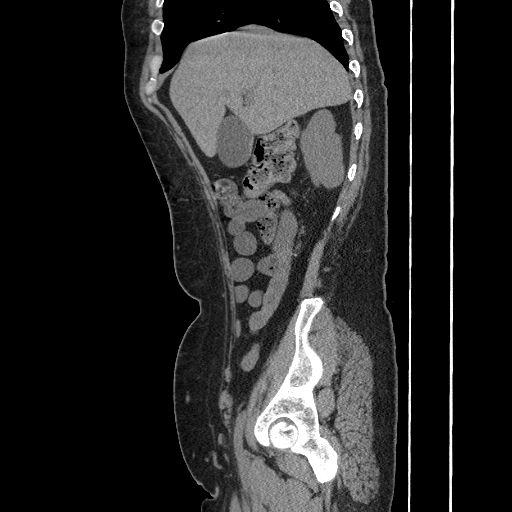
[im 99/223  soft-tissue]
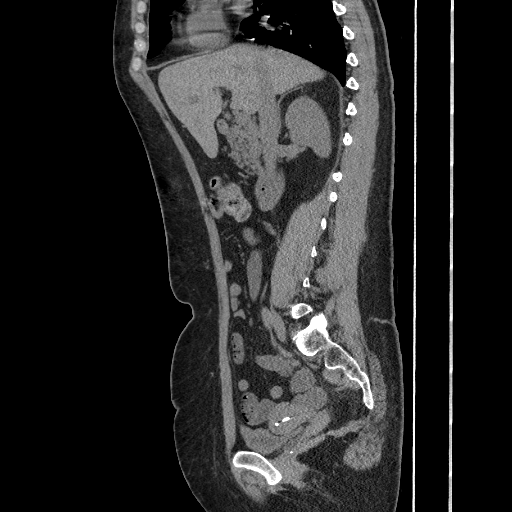
[im 124/223  soft-tissue]
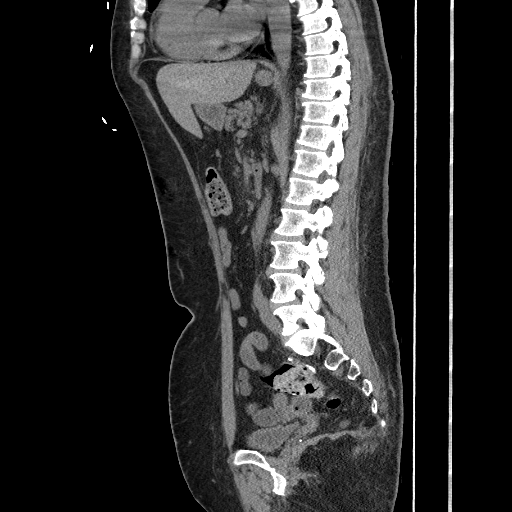

[17 of 46 positions shown; findings below may reference images not displayed]

FINDINGS: CT of the abdomen and pelvis performed with multiplanar reformations. No IV contrast was administered which degrades solid organ and vascular evaluation
VISUALIZED LOWER CHEST: Within normal limits.
LIVER: Within normal limits
GALLBLADDER: Cholelithiasis. No gallbladder wall thickening or pericholecystic fluid.
BILIARY: Grossly within normal limits.
PANCREAS: Within normal limits.
SPLEEN: Within normal limits.
ADRENAL GLANDS: No adrenal nodules.
KIDNEYS AND URETERS: No hydronephrosis. No urinary calculi.
URINARY BLADDER: Within normal limits.
REPRODUCTIVE: Intrauterine device appears in appropriate position
PERITONEUM: No pathologic free intraperitoneal fluid.
BOWEL: Anastomotic changes seen in the rectosigmoid region. Fluid distended distal small bowel loops are noted. Colonic diverticulosis without evidence for acute diverticulitis. Prior appendectomy.
VASCULATURE: Normal caliber aorta.
LYMPH NODES: No lymphadenopathy.
ABDOMINAL WALL: No bowel containing hernia.
MUSCULOSKELETAL: Mild to moderate spondylosis.
IMPRESSION: *  Fluid distended distal small bowel loops which could be seen with enteritis
*  Cholelithiasis
Is the patient pregnant?
Waiting for Lab Result

## 2024-02-13 ENCOUNTER — Encounter: Payer: Self-pay | Admitting: Internal Medicine
